# Patient Record
Sex: Male | Born: 1935 | Race: White | Hispanic: No | Marital: Married | Smoking: Former smoker
Health system: Southern US, Community
[De-identification: ages and names within clinical notes are randomized; demographics above are authoritative.]

## PROBLEM LIST (undated history)

## (undated) DIAGNOSIS — I2510 Atherosclerotic heart disease of native coronary artery without angina pectoris: Secondary | ICD-10-CM

## (undated) DIAGNOSIS — E785 Hyperlipidemia, unspecified: Secondary | ICD-10-CM

## (undated) DIAGNOSIS — I714 Abdominal aortic aneurysm, without rupture: Secondary | ICD-10-CM

## (undated) DIAGNOSIS — K219 Gastro-esophageal reflux disease without esophagitis: Secondary | ICD-10-CM

## (undated) DIAGNOSIS — R269 Unspecified abnormalities of gait and mobility: Secondary | ICD-10-CM

## (undated) DIAGNOSIS — Z87442 Personal history of urinary calculi: Secondary | ICD-10-CM

## (undated) DIAGNOSIS — K279 Peptic ulcer, site unspecified, unspecified as acute or chronic, without hemorrhage or perforation: Secondary | ICD-10-CM

## (undated) DIAGNOSIS — R42 Dizziness and giddiness: Secondary | ICD-10-CM

## (undated) DIAGNOSIS — I739 Peripheral vascular disease, unspecified: Secondary | ICD-10-CM

## (undated) DIAGNOSIS — D509 Iron deficiency anemia, unspecified: Secondary | ICD-10-CM

## (undated) DIAGNOSIS — D126 Benign neoplasm of colon, unspecified: Secondary | ICD-10-CM

## (undated) DIAGNOSIS — K3184 Gastroparesis: Secondary | ICD-10-CM

## (undated) DIAGNOSIS — I10 Essential (primary) hypertension: Secondary | ICD-10-CM

## (undated) HISTORY — DX: Abdominal aortic aneurysm, without rupture: I71.4

## (undated) HISTORY — DX: Unspecified abnormalities of gait and mobility: R26.9

## (undated) HISTORY — DX: Peptic ulcer, site unspecified, unspecified as acute or chronic, without hemorrhage or perforation: K27.9

## (undated) HISTORY — PX: HERNIA REPAIR: SHX51

## (undated) HISTORY — DX: Atherosclerotic heart disease of native coronary artery without angina pectoris: I25.10

## (undated) HISTORY — DX: Iron deficiency anemia, unspecified: D50.9

## (undated) HISTORY — DX: Peripheral vascular disease, unspecified: I73.9

## (undated) HISTORY — DX: Gastro-esophageal reflux disease without esophagitis: K21.9

## (undated) HISTORY — PX: VASECTOMY: SHX75

## (undated) HISTORY — PX: TONSILLECTOMY: SUR1361

## (undated) HISTORY — DX: Benign neoplasm of colon, unspecified: D12.6

## (undated) HISTORY — DX: Essential (primary) hypertension: I10

## (undated) HISTORY — PX: CATARACT EXTRACTION: SUR2

## (undated) HISTORY — DX: Gastroparesis: K31.84

## (undated) HISTORY — PX: COLONOSCOPY: SHX174

## (undated) HISTORY — DX: Hyperlipidemia, unspecified: E78.5

## (undated) HISTORY — DX: Dizziness and giddiness: R42

---

## 1999-04-11 ENCOUNTER — Encounter: Admission: RE | Admit: 1999-04-11 | Discharge: 1999-07-10 | Payer: Self-pay | Admitting: Endocrinology

## 2000-06-26 ENCOUNTER — Encounter: Payer: Self-pay | Admitting: Urology

## 2000-06-27 ENCOUNTER — Ambulatory Visit (HOSPITAL_COMMUNITY): Admission: RE | Admit: 2000-06-27 | Discharge: 2000-06-27 | Payer: Self-pay | Admitting: Urology

## 2002-08-04 ENCOUNTER — Encounter: Payer: Self-pay | Admitting: Gastroenterology

## 2003-04-06 ENCOUNTER — Emergency Department (HOSPITAL_COMMUNITY): Admission: EM | Admit: 2003-04-06 | Discharge: 2003-04-07 | Payer: Self-pay | Admitting: Emergency Medicine

## 2003-04-07 ENCOUNTER — Encounter: Payer: Self-pay | Admitting: Emergency Medicine

## 2003-11-29 DIAGNOSIS — D126 Benign neoplasm of colon, unspecified: Secondary | ICD-10-CM

## 2003-11-29 HISTORY — DX: Benign neoplasm of colon, unspecified: D12.6

## 2003-11-30 ENCOUNTER — Encounter: Payer: Self-pay | Admitting: Gastroenterology

## 2004-06-03 ENCOUNTER — Ambulatory Visit: Payer: Self-pay | Admitting: Pulmonary Disease

## 2004-07-13 ENCOUNTER — Ambulatory Visit: Payer: Self-pay | Admitting: Pulmonary Disease

## 2004-08-17 ENCOUNTER — Ambulatory Visit: Payer: Self-pay | Admitting: Pulmonary Disease

## 2005-04-12 ENCOUNTER — Ambulatory Visit: Payer: Self-pay | Admitting: Pulmonary Disease

## 2005-05-12 HISTORY — PX: CARDIAC CATHETERIZATION: SHX172

## 2005-05-18 ENCOUNTER — Inpatient Hospital Stay (HOSPITAL_COMMUNITY): Admission: AD | Admit: 2005-05-18 | Discharge: 2005-05-21 | Payer: Self-pay | Admitting: Cardiovascular Disease

## 2005-05-18 HISTORY — PX: CORONARY ANGIOPLASTY: SHX604

## 2005-05-19 HISTORY — PX: OTHER SURGICAL HISTORY: SHX169

## 2005-06-08 ENCOUNTER — Encounter (HOSPITAL_COMMUNITY): Admission: RE | Admit: 2005-06-08 | Discharge: 2005-09-06 | Payer: Self-pay | Admitting: Cardiovascular Disease

## 2005-09-07 ENCOUNTER — Encounter (HOSPITAL_COMMUNITY): Admission: RE | Admit: 2005-09-07 | Discharge: 2005-12-06 | Payer: Self-pay | Admitting: Cardiovascular Disease

## 2005-10-17 ENCOUNTER — Ambulatory Visit: Payer: Self-pay | Admitting: Pulmonary Disease

## 2005-10-27 ENCOUNTER — Ambulatory Visit: Payer: Self-pay | Admitting: Gastroenterology

## 2005-11-07 ENCOUNTER — Encounter (INDEPENDENT_AMBULATORY_CARE_PROVIDER_SITE_OTHER): Payer: Self-pay | Admitting: *Deleted

## 2005-11-07 ENCOUNTER — Ambulatory Visit (HOSPITAL_COMMUNITY): Admission: RE | Admit: 2005-11-07 | Discharge: 2005-11-07 | Payer: Self-pay | Admitting: Gastroenterology

## 2005-12-14 ENCOUNTER — Ambulatory Visit: Payer: Self-pay | Admitting: Gastroenterology

## 2006-01-03 ENCOUNTER — Ambulatory Visit: Payer: Self-pay | Admitting: Pulmonary Disease

## 2006-06-18 ENCOUNTER — Ambulatory Visit: Payer: Self-pay | Admitting: Gastroenterology

## 2006-08-03 ENCOUNTER — Ambulatory Visit: Payer: Self-pay | Admitting: Pulmonary Disease

## 2007-05-23 ENCOUNTER — Ambulatory Visit: Payer: Self-pay | Admitting: Gastroenterology

## 2007-09-26 DIAGNOSIS — I10 Essential (primary) hypertension: Secondary | ICD-10-CM | POA: Insufficient documentation

## 2007-09-26 DIAGNOSIS — I2510 Atherosclerotic heart disease of native coronary artery without angina pectoris: Secondary | ICD-10-CM | POA: Insufficient documentation

## 2007-09-26 DIAGNOSIS — N210 Calculus in bladder: Secondary | ICD-10-CM | POA: Insufficient documentation

## 2007-09-26 DIAGNOSIS — E7800 Pure hypercholesterolemia, unspecified: Secondary | ICD-10-CM | POA: Insufficient documentation

## 2007-09-26 DIAGNOSIS — J309 Allergic rhinitis, unspecified: Secondary | ICD-10-CM | POA: Insufficient documentation

## 2007-09-26 DIAGNOSIS — K429 Umbilical hernia without obstruction or gangrene: Secondary | ICD-10-CM | POA: Insufficient documentation

## 2007-09-26 DIAGNOSIS — K219 Gastro-esophageal reflux disease without esophagitis: Secondary | ICD-10-CM | POA: Insufficient documentation

## 2007-09-26 DIAGNOSIS — N200 Calculus of kidney: Secondary | ICD-10-CM | POA: Insufficient documentation

## 2007-09-26 DIAGNOSIS — K279 Peptic ulcer, site unspecified, unspecified as acute or chronic, without hemorrhage or perforation: Secondary | ICD-10-CM | POA: Insufficient documentation

## 2007-09-26 DIAGNOSIS — Z8679 Personal history of other diseases of the circulatory system: Secondary | ICD-10-CM | POA: Insufficient documentation

## 2008-04-22 HISTORY — PX: NM MYOVIEW LTD: HXRAD82

## 2008-07-13 ENCOUNTER — Encounter: Payer: Self-pay | Admitting: Internal Medicine

## 2008-07-31 DIAGNOSIS — I714 Abdominal aortic aneurysm, without rupture, unspecified: Secondary | ICD-10-CM

## 2008-07-31 HISTORY — DX: Abdominal aortic aneurysm, without rupture: I71.4

## 2008-07-31 HISTORY — DX: Abdominal aortic aneurysm, without rupture, unspecified: I71.40

## 2008-10-12 ENCOUNTER — Encounter: Payer: Self-pay | Admitting: Internal Medicine

## 2008-10-29 ENCOUNTER — Encounter: Admission: RE | Admit: 2008-10-29 | Discharge: 2008-10-29 | Payer: Self-pay | Admitting: Cardiovascular Disease

## 2008-11-03 ENCOUNTER — Ambulatory Visit (HOSPITAL_COMMUNITY): Admission: RE | Admit: 2008-11-03 | Discharge: 2008-11-03 | Payer: Self-pay | Admitting: Cardiovascular Disease

## 2008-11-18 ENCOUNTER — Encounter: Payer: Self-pay | Admitting: Internal Medicine

## 2008-11-26 ENCOUNTER — Encounter: Payer: Self-pay | Admitting: Internal Medicine

## 2008-12-08 ENCOUNTER — Ambulatory Visit: Payer: Self-pay | Admitting: Vascular Surgery

## 2009-01-23 ENCOUNTER — Encounter: Admission: RE | Admit: 2009-01-23 | Discharge: 2009-01-23 | Payer: Self-pay | Admitting: Orthopedic Surgery

## 2009-02-22 DIAGNOSIS — Z860100 Personal history of colon polyps, unspecified: Secondary | ICD-10-CM | POA: Insufficient documentation

## 2009-02-25 ENCOUNTER — Ambulatory Visit: Payer: Self-pay | Admitting: Gastroenterology

## 2009-02-25 DIAGNOSIS — K3184 Gastroparesis: Secondary | ICD-10-CM | POA: Insufficient documentation

## 2009-02-26 ENCOUNTER — Encounter: Payer: Self-pay | Admitting: Gastroenterology

## 2009-03-03 ENCOUNTER — Encounter: Payer: Self-pay | Admitting: Internal Medicine

## 2009-03-04 ENCOUNTER — Encounter: Payer: Self-pay | Admitting: Gastroenterology

## 2009-03-25 ENCOUNTER — Encounter: Payer: Self-pay | Admitting: Gastroenterology

## 2009-03-25 ENCOUNTER — Ambulatory Visit: Payer: Self-pay | Admitting: Gastroenterology

## 2009-03-27 ENCOUNTER — Encounter: Payer: Self-pay | Admitting: Gastroenterology

## 2009-12-20 HISTORY — PX: NM MYOVIEW LTD: HXRAD82

## 2010-10-26 ENCOUNTER — Ambulatory Visit (INDEPENDENT_AMBULATORY_CARE_PROVIDER_SITE_OTHER): Payer: Medicare Other | Admitting: Vascular Surgery

## 2010-10-26 DIAGNOSIS — I6529 Occlusion and stenosis of unspecified carotid artery: Secondary | ICD-10-CM

## 2010-10-28 ENCOUNTER — Ambulatory Visit (HOSPITAL_COMMUNITY)
Admission: RE | Admit: 2010-10-28 | Discharge: 2010-10-28 | Disposition: A | Discharge status: Home / Self Care | Payer: Medicare Other | Source: Ambulatory Visit | Attending: Vascular Surgery | Admitting: Vascular Surgery

## 2010-10-28 ENCOUNTER — Other Ambulatory Visit: Payer: Self-pay | Admitting: Vascular Surgery

## 2010-10-28 ENCOUNTER — Encounter (HOSPITAL_COMMUNITY)
Admission: RE | Admit: 2010-10-28 | Discharge: 2010-10-28 | Disposition: A | Discharge status: Home / Self Care | Payer: Medicare Other | Source: Ambulatory Visit | Attending: Vascular Surgery | Admitting: Vascular Surgery

## 2010-10-28 DIAGNOSIS — I6529 Occlusion and stenosis of unspecified carotid artery: Secondary | ICD-10-CM

## 2010-10-28 DIAGNOSIS — Z01818 Encounter for other preprocedural examination: Secondary | ICD-10-CM | POA: Insufficient documentation

## 2010-10-28 DIAGNOSIS — Z01812 Encounter for preprocedural laboratory examination: Secondary | ICD-10-CM | POA: Insufficient documentation

## 2010-10-28 DIAGNOSIS — Z87891 Personal history of nicotine dependence: Secondary | ICD-10-CM | POA: Insufficient documentation

## 2010-10-28 DIAGNOSIS — Z01810 Encounter for preprocedural cardiovascular examination: Secondary | ICD-10-CM | POA: Insufficient documentation

## 2010-10-28 DIAGNOSIS — I10 Essential (primary) hypertension: Secondary | ICD-10-CM | POA: Insufficient documentation

## 2010-10-28 LAB — CBC
Hemoglobin: 14.4 g/dL (ref 13.0–17.0)
MCH: 30.1 pg (ref 26.0–34.0)
RBC: 4.78 MIL/uL (ref 4.22–5.81)
WBC: 7 10*3/uL (ref 4.0–10.5)

## 2010-10-28 LAB — URINALYSIS, ROUTINE W REFLEX MICROSCOPIC
Glucose, UA: NEGATIVE mg/dL
Nitrite: NEGATIVE
Protein, ur: NEGATIVE mg/dL
Urobilinogen, UA: 0.2 mg/dL (ref 0.0–1.0)

## 2010-10-28 LAB — COMPREHENSIVE METABOLIC PANEL
Alkaline Phosphatase: 63 U/L (ref 39–117)
BUN: 16 mg/dL (ref 6–23)
CO2: 28 mEq/L (ref 19–32)
Chloride: 106 mEq/L (ref 96–112)
Creatinine, Ser: 0.74 mg/dL (ref 0.4–1.5)
GFR calc non Af Amer: >60 mL/min (ref >60)
Glucose, Bld: 110 mg/dL — ABNORMAL HIGH (ref 70–99)
Potassium: 3.7 mEq/L (ref 3.5–5.1)
Total Bilirubin: 0.6 mg/dL (ref 0.3–1.2)

## 2010-10-28 LAB — APTT: aPTT: 25 seconds (ref 24–37)

## 2010-10-28 LAB — URINE MICROSCOPIC-ADD ON

## 2010-10-28 LAB — PROTIME-INR
INR: 0.93 (ref 0.00–1.49)
Prothrombin Time: 12.7 seconds (ref 11.6–15.2)

## 2010-10-28 LAB — SURGICAL PCR SCREEN: MRSA, PCR: NEGATIVE

## 2010-10-31 NOTE — H&P (Signed)
HISTORY AND PHYSICAL EXAMINATION  October 26, 2010  Re:  Nathan Santiago, Nathan Santiago             DOB:  [birth year]  REASON FOR ADMISSION:  Greater than 80% right carotid stenosis.  HISTORY:  This is a pleasant 77 year old gentleman who was referred by Dr. Alanda Amass with a greater than 80% right carotid stenosis.  I had last seen him in May 2011 when he had a 60% to 79% right carotid stenosis with 40% to 59% left carotid stenosis.  He was asymptomatic. On a recent follow-up duplex scan at [facility] on October 14, 2010, the stenosis on the right had progressed to greater than 80%.  Of note, he is asymptomatic.  He denies any history of stroke, TIAs, expressive or receptive aphasia, or amaurosis fugax.  He does note that he has been having some trembling in his left hand which has been constant for the last several months.  His past medical history significant for hypertension, hypercholesterolemia, and coronary artery disease.  He has had previous coronary stents in the past by Dr. Alanda Amass.  He has had no recent cardiac issues.  He denies any history of previous myocardial infarction, history congestive heart failure, or history of COPD.  SOCIAL HISTORY:  He is married.  He is 2 grown children.  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  He does not smoke cigarettes.  FAMILY HISTORY:  He is unaware of any history of premature cardiovascular disease.  ALLERGIES:  LISINOPRIL and NIASPAN.  MEDICATIONS: 1. Amlodipine 5 mg p.o. q.a.m. 2. Hydrochlorothiazide 25 mg p.o. q.a.m. 3. Plavix 75 mg q.h.s.  This was held on October 26, 2010. 4. Potassium chloride 20 mEq p.o. q.h.s. 5. Atenolol 50 mg p.o. q.a.m. 6. Metoclopramide 5 mg p.o. q.p.m. 7. Omeprazole 40 mg p.o. b.i.d. 8. Pletal 100 mg p.o. b.i.d. 9. Terazosin 2 mg 1.p.o. q.h.s. 10.Aspirin 81 mg p.o. daily. 11.Vytorin 10/80 one p.o. daily. 12.Multivitamin 1 p.o. daily. 13.Vitamin D3 p.o.  daily. 14.Primrose Oil. 15.Vitamin C. 16.Biotin. 17.Omega-3 Fish Oil. 18.Calcium citrate. 19.Glucosamine.  REVIEW OF SYSTEMS:  GENERAL:  He had no recent weight loss, weight gain, or problems with his appetite.  CARDIOVASCULAR:  He had no chest pain, chest pressure, palpitations, or arrhythmias.  He had no history of DVT or phlebitis.  He denies claudication.  GI:  He does have a history of reflux.  GU:  He does have some urinary frequency.  NEUROLOGIC, PULMONARY, HEMATOLOGIC, ENT, MUSCULOSKELETAL, PSYCHIATRIC, INTEGUMENTARY review of systems are unremarkable.  PHYSICAL EXAMINATION:  This is a pleasant 77 year old gentleman who appears his stated age. Blood pressure is 125/63, heart rate is 77, respiratory rate is 12. HEENT:  Unremarkable. LUNGS:  Clear bilaterally to auscultation without rales, rhonchi, or wheezing. CARDIOVASCULAR:  He has a right carotid bruit.  He has a regular rate and rhythm.  He has palpable radial and femoral pulse.  Both feet are warm and well-perfused without ischemic ulcers.  He has no significant lower extremity swelling. ABDOMEN:  Soft and nontender with normal pitched bowel sounds. MUSCULOSKELETAL:  He has no major deformities or cyanosis. NEUROLOGIC:  He has no focal weakness or paresthesias. SKIN:  There are no ulcers or rashes.  I have reviewed his notes from Dr. Kandis Cocking office.  He did have a Cardiolite in May 2011 which showed no ischemia.  I have also reviewed his carotid duplex scan which shows a greater than 80% right carotid stenosis with a 50%  to 69% left carotid stenosis.  Both vertebral arteries are patent with normally directed flow.  Given that the right carotid stenosis has progressed to greater than 80%, I have recommended right carotid endarterectomy in order to lower his risk of future stroke.  We have discussed the indications for the procedure and the potential complications including but not limited to bleeding, stroke  (periprocedural risk 1% to 2%), nerve injury, MI, or other unpredictable medical problems.  All of his questions were answered and he is agreeable to proceed.  His surgery has been scheduled for April 3rd.  We will hold his Plavix 1 week preoperatively and then resume this after surgery.    Nathan Santiago. Edilia Bo, M.D. Electronically Signed  CSD/MEDQ  D:  10/26/2010  T:  10/27/2010  Job:  [id number]  cc:   Gerlene Burdock A. Alanda Amass, M.D.

## 2010-11-01 ENCOUNTER — Inpatient Hospital Stay (HOSPITAL_COMMUNITY)
Admission: RE | Admit: 2010-11-01 | Discharge: 2010-11-02 | DRG: 039 | Disposition: A | Discharge status: Home / Self Care | Payer: Medicare Other | Source: Ambulatory Visit | Attending: Vascular Surgery | Admitting: Vascular Surgery

## 2010-11-01 ENCOUNTER — Other Ambulatory Visit: Payer: Self-pay | Admitting: Vascular Surgery

## 2010-11-01 DIAGNOSIS — I6529 Occlusion and stenosis of unspecified carotid artery: Secondary | ICD-10-CM

## 2010-11-01 HISTORY — PX: CAROTID ENDARTERECTOMY: SUR193

## 2010-11-02 LAB — BASIC METABOLIC PANEL
BUN: 11 mg/dL (ref 6–23)
Calcium: 8.3 mg/dL — ABNORMAL LOW (ref 8.4–10.5)
GFR calc non Af Amer: >60 mL/min (ref >60)
Glucose, Bld: 129 mg/dL — ABNORMAL HIGH (ref 70–99)
Potassium: 3.8 mEq/L (ref 3.5–5.1)
Sodium: 140 mEq/L (ref 135–145)

## 2010-11-02 LAB — CBC
MCHC: 34.6 g/dL (ref 30.0–36.0)
Platelets: 135 10*3/uL — ABNORMAL LOW (ref 150–400)
RDW: 12.3 % (ref 11.5–15.5)
WBC: 8.6 10*3/uL (ref 4.0–10.5)

## 2010-11-02 NOTE — Op Note (Signed)
NAMEDAMARE, SERANO NO.:  [id number]  MEDICAL RECORD NO.:  [id number]           PATIENT TYPE:  I  LOCATION:  2316                         FACILITY:  [facility]  PHYSICIAN:  Di Kindle. Edilia Bo, M.D.DATE OF BIRTH:  01/03/1931  DATE OF PROCEDURE:  11/01/2010 DATE OF DISCHARGE:                              OPERATIVE REPORT   PREOPERATIVE DIAGNOSIS:  Greater than 80% right carotid stenosis.  POSTOPERATIVE DIAGNOSIS:  Greater than 80% right carotid stenosis.  PROCEDURE:  Right carotid endarterectomy with Dacron patch angioplasty.  SURGEON:  Di Kindle. Edilia Bo, MD  ASSISTANT:  Della Goo, PA-C  ANESTHESIA:  General.  INDICATIONS:  This is a 77 year old gentleman who had a known moderate right carotid stenosis.  This progressed to greater than 80% and right carotid endarterectomy was recommended in order to lower his risk of future stroke.  TECHNIQUE:  The patient was taken to the operating room and received a general anesthetic.  After careful positioning, the right neck was prepped and draped in the usual sterile fashion.  An incision was made along the anterior border of the sternocleidomastoid and dissection was carried down to the common carotid artery which was dissected free and controlled with a Rumel tourniquet.  The facial vein was divided between 2-0 silk ties.  The common carotid artery plaque extended quite well and I had to extend the incision distally to allow adequate exposure of the common carotid artery below the plaque.  Bifurcation was fairly high and I had to fully mobilize the hypoglossal nerve.  The ansa cervicalis was mobilized laterally.  The common carotid artery was controlled with an umbilical tape.  The superior thyroid artery was controlled with a 2-0 silk tie.  The external carotid artery was controlled with a blue loop and the internal carotid artery was controlled above the plaque.  The patient was then heparinized.   After the heparin had circulated, clamps were placed on the internal and the external and common carotid artery. Longitudinal arteriotomy was made in the common carotid artery and this was extended to the plaque into the internal carotid artery above the plaque.  A 12 shunt would not pass easily, therefore I elected to use a #10 shunt below.  This was slightly small for the size of the artery. Shunt was placed into the internal carotid artery, backbled, and placed in the common carotid artery and flow established to the shunt.  This shunt was secured with a Rumel tourniquet.  Endarterectomy plane was established proximally and the plaque was sharply divided.  Eversion endarterectomy was performed of the external carotid artery.  Distally, there was a nice taper in the plaque.  One posterior tacking suture was placed.  The artery was irrigated with copious amounts of heparin and dextran and all loose debris removed.  Dacron patch was then sewn using continuous 6-0 Prolene suture.  Prior to completing the patch closure, the shunt was removed.  The arteries were backbled and flushed appropriately and anastomosis was completed.  Flow was reestablished first to the external carotid artery and then to the internal carotid artery.  I think the patient had a good  pulse distal to the patch and a Doppler signal.  Hemostasis was obtained in the wound.  The wound was closed with a deep layer of 3-0 Vicryl.  The platysma was closed with running 3-0 Vicryl and the skin was closed with 4-0 subcuticular stitch.  Sterile dressing was applied.  The patient tolerated the procedure well and was transferred to the recovery room in stable condition.  All needle and sponge counts were correct.     Di Kindle. Edilia Bo, M.D.     CSD/MEDQ  D:  11/01/2010  T:  11/02/2010  Job:  [id number]  cc:   Gerlene Burdock A. Alanda Amass, M.D.  Electronically Signed by Waverly Ferrari M.D. on 11/02/2010 12:30:13 PM

## 2010-11-04 LAB — TYPE AND SCREEN: ABO/RH(D): O POS

## 2010-11-04 LAB — ABO/RH: ABO/RH(D): O POS

## 2010-11-15 NOTE — Discharge Summary (Addendum)
  Nathan Santiago, Nathan Santiago NO.:  [id number]  MEDICAL RECORD NO.:  [id number]           PATIENT TYPE:  LOCATION:                                 FACILITY:  PHYSICIAN:  Di Kindle. Edilia Bo, M.D.DATE OF BIRTH:  12/01/1930  DATE OF ADMISSION: DATE OF DISCHARGE:                              DISCHARGE SUMMARY   ADDENDUM  DISCHARGE DIAGNOSES:  Right carotid stenosis which is asymptomatic.  His other discharge diagnoses are hypertension, cardiac arrhythmia, and peripheral vascular disease.  DISCHARGE MEDICATIONS: 1. Calcium citrate. 2. Magnesium 1 daily. 3. Biotin 5 mg daily. 4. Primrose 1300 mg daily. 5. Vitamin D daily. 6. Multivitamins daily. 7. Vytorin 10/80 mg daily. 8. Terazosin 5 mg daily at bedtime. 9. Pletal 100 mg twice daily. 10.Omeprazole 40 mg twice daily. 11.Atenolol 50 mg daily. 12.Potassium chloride 20 mEq daily. 13.Plavix 75 mg daily. 14.Hydrochlorothiazide 25 mg daily. 15.Amlodipine 5 mg daily.  DISPOSITION:  The patient was discharged to home and he will follow up with Dr. Edilia Bo in 2 weeks.     Della Goo, PA-C   ______________________________ Di Kindle. Edilia Bo, M.D.    RR/MEDQ  D:  11/11/2010  T:  11/12/2010  Job:  [id number]  Electronically Signed by Waverly Ferrari M.D. on 11/15/2010 08:36:46 AM Electronically Signed by Della Goo PA on 11/21/2010 02:10:37 PM

## 2010-11-16 ENCOUNTER — Ambulatory Visit (INDEPENDENT_AMBULATORY_CARE_PROVIDER_SITE_OTHER): Payer: Medicare Other | Admitting: Vascular Surgery

## 2010-11-16 DIAGNOSIS — I6529 Occlusion and stenosis of unspecified carotid artery: Secondary | ICD-10-CM

## 2010-11-17 NOTE — Assessment & Plan Note (Signed)
OFFICE VISIT  Nathan Santiago, Nathan Santiago DOB:  Aug 25, 1930                                       11/16/2010 UJWJX#:[id number]  I saw this patient in the office today for follow-up after his recent right carotid endarterectomy.  This is a pleasant 77 year old gentleman who had a known moderate right carotid stenosis.  This progressed to greater than 80% and right carotid endarterectomy was recommended in order to lower his risk of future stroke.  He underwent right carotid endarterectomy with Dacron patch angioplasty in 11/01/2010.  He did well postoperatively and returns for his first postoperative visit.  He states that he had had some occasional dizziness prior to his surgery but this apparently has resolved.  He was asymptomatic other than this prior to surgery and has had no focal neurologic symptoms since his surgery.  He remains on aspirin and Plavix.  PHYSICAL EXAMINATION:  This is a pleasant 77 year old gentleman who appears his stated age.  Blood pressure is 149/75, heart rate is 89, respiratory rate is 12. His right neck incision is healing nicely. Lungs:  Clear bilaterally to auscultation without rales, rhonchi or wheezing.  Cardiovascular exam:  He has a regular rate and rhythm.  He has palpable femoral pulses.  Neurologic:  He has no focal weakness or paresthesias.  Overall I am pleased with his progress.  I will see him back in 6 months with follow-up carotid duplex scan.  He knows to call sooner if  he has problems.  Has a known 50%-60% left carotid stenosis which we will continue to follow.    Di Kindle. Edilia Bo, M.D. Electronically Signed  CSD/MEDQ  D:  11/16/2010  T:  11/17/2010  Job:  [id number]  cc:   Gerlene Burdock A. Alanda Amass, M.D.

## 2010-12-13 NOTE — Cardiovascular Report (Signed)
Nathan Santiago, Nathan Santiago NO.:  [id number]   MEDICAL RECORD NO.:  [id number]          PATIENT TYPE:  AMB   LOCATION:  SDS                          FACILITY:  [facility]   PHYSICIAN:  Richard A. Alanda Amass, M.D.DATE OF BIRTH:  12/08/1930   DATE OF PROCEDURE:  11/03/2008  DATE OF DISCHARGE:  11/03/2008                            CARDIAC CATHETERIZATION   PROCEDURE:  Retrograde central aortic catheterization; aortic arch  angiogram, LAO projection; abdominal aortic angiogram, midstream PA  projection; bilateral iliac angiogram, PA projection; bilateral lower  extremity runoff; four-vessel cerebral angiography.   The patient was brought to the Second Floor PV lab in a postabsorptive  after 5 mg Valium p.o. premedication.  The right groin was prepped and  draped in usual manner.  Xylocaine 1% was used for local anesthesia and  RCFA was entered with a single anterior puncture using an 18 thin-wall  needle.  A 5-French short arterial side-arm sheath was inserted without  difficulty.  A wholly wire was used for catheter exchange and to  negotiate the iliacs and abdominal aorta.  A 5-French pigtail catheter  was positioned in the aortic root and aortic root angiogram was done in  the LAO projection with DSA at 25 mL, 20 mL per second.  Catheter was  brought down above the level of the renal arteries and abdominal  angiogram was done at 20 mL, 20 mL per second.  Catheter was then  brought above the iliac bifurcation and the suprailiac bifurcation,  atherosclerotic stenotic area, and bilateral iliac angiography was done  in the PA projection at 20 mL, 20 mL per second with DSA.  Bilateral  lower extremity runoff was done at 77 mL, 7 mL per second.  Catheter was  then exchanged for a JV-1 5-French catheter.  Four-vessel cerebral  angiography was then performed using the JV-1 catheter with selective  and subselective right vertebral injections and selective right and left  common  carotid injections and subselective left vertebral and selective  left subclavian injections.  DSA imaging was used.  Guidewire vein was  used for catheter manipulation and exchanges.  Cervical carotid  angiography was done in PA, lateral, and oblique projections, right and  left and intracranial injections were performed in PA and lateral for  bilateral carotids and vertebrals.  Catheters were removed.  Transstenotic gradient initially with a pigtail catheter and then an end-  hole catheter across the distal abdominal aorta was done.  There was a  gradient of approximately 15 mmHg across the distal abdominal aorta into  the right iliac.   The patient had intermittent mild exacerbations of pressure having him  void to lower his blood pressure down, 230 systolic to 180.  At the end  of the procedure with systolics of about 200 in sinus rhythm.  He was  given 10 mg of labetalol for pressure reduction, which was successful.  Side-arm sheath was flushed.  The patient was transferred to the holding  area for sheath removal and pressure hemostasis.  He tolerated the  procedure well and was intact neurologically.   COMMENT:  The right vertebral artery  in the extraosseous, foraminal,  extraspinal, and intradural area show tortuosity, but was widely patent.  There were few areas of 30-40% narrowing in the midportion of the right  vertebra with good lumen and no flow restriction.  The left vertebral  artery in the similar areas showed a 40% eccentric narrowing in the  proximal third, 40-50% in midportion, 40% in the distal cervical third,  and a tortuous vessel of good size with good antegrade flow.  Intracranial vertebrobasilar system bilaterally showed intact basilar  artery and bilateral intact posterior cerebral arteries and normal  origin of the ICA and cerebral branches bilaterally.   The right common carotid artery had no significant stenosis.   The right internal carotid had 80-85%  eccentric highly calcific stenosis  in its proximal portion.  There was no thrombus visible.  The external  carotid had no significant stenosis.  The cervical, petrous, cavernous,  and communicating intracerebral carotids showed no significant stenosis.  Right anterior cerebral was intact.  Anterior communicating was faintly  visualized and middle cerebral branches appeared normal with normal  cannulation of vessels to the outer cortex and normal venous return.   Left common carotid showed no significant stenosis.  Left internal  carotid had 50-60% calcific narrowing.  The left intracranial carotid  and its branches likewise appeared with no significant stenosis.  Normal  left anterior cerebral and left MCA and branches continuing to the outer  cortex.  Normal left-sided intracranial venous return.   The aortic arch showed normal origin of the great vessels and was a type  1 to 1-1/2 arch with no significant calcification.   Abdominal aortic angiogram demonstrated normal SMA and celiac axis.  Both renal arteries were single and normal.   The infrarenal abdominal aorta just above the iliac bifurcation, but  separate from the bifurcation showed approximately 70% calcific complex  atheromatous narrowing.  Lateral angiogram was not helpful in this  regard.  There was approximately 15-mm pullback gradient across this  area into the iliacs.   There was 30% right common iliac narrowing.  No significant left common  iliac narrowing.   Hypogastrics were intact bilaterally.   The right external iliac had 40% proximal narrowing.  The LEIA had no  significant narrowing.  The profunda SFA junctions were intact  bilaterally with no significant stenosis and no CFA stenosis  bilaterally.   The right SFA at 40% proximal, 60% eccentric mid, and 50% of [place]  canal narrowing with good flow.  The popliteal had no significant  stenosis and there appeared to be three-vessel runoff with mild  diffuse  disease of the posterior tibial, but no high-grade stenosis.   The left SFA had 50% mid, 70% at [facility] canal, and 60% near Wallace canal  eccentric narrowing with good flow.  The left popliteal had no  significant stenosis and there was three-vessel runoff of the left lower  extremity.   DISCUSSION:  Mr. Chalfin is a 77 year old white, married, father of  2, with 4 GC, who stopped smoking at 10.  He has systemic hypertension,  hyperlipidemia, asymptomatic carotid disease, coronary disease, and  bilateral claudication.  He has had progressive claudication for  approximately 6 months and he is unable to walk on the golf course.  He  has bilateral lower extremity calf claudication, bilateral lower  extremities generalized fatigue on walking approximately 50-75 yards.   ABIs of October 12, 2008, showed elevated velocities in the common iliacs  bilaterally.  RABI 0.81, LABI 0.81  with three-vessel runoff and no high-  grade SFA velocities bilaterally.   Followup carotid Dopplers of October 12, 2008, showed elevated right  internal carotid velocities of 315/90.  Left carotid velocities of  184/43.  There was calcific shattering bilaterally, right greater than  left.   The patient has had prior coronary complex intervention in a staged  fashion on May 18, 2005.  He had triple tandem DES stenting to the  proximal - mid RCA and double tandem DES stenting of the distal RCA  (five stents in the RCA).   He then had a staged circumflex DES stent on May 19, 2005.  He has  not required coronary recatheterization since that time.  He has not had  angina and he has had negative Cardiolite for ischemia on April 22, 2008.  His lipids today is under good control with cholesterol 139, LDL  of 87, HDL 41 on high-dose Vytorin 10/80.  He has not had any symptoms  of heart failure.   Angiography shows asymptomatic high-grade right internal carotid  stenosis with elevated velocities.   I believe he is a potential  candidate for carotid endarterectomy.  He does have known coronary  disease, although asymptomatic, and he had a Cardiolite last year, and  will probably want to follow up another Cardiolite because of his  complex interventions in the past.   He has stenosis of his distal abdominal aorta without significant iliac  or high-grade SFA disease and may have because of bilateral symptoms,  inflow problems.  He is a possible candidate for distal abdominal aortic  stenting for his symptomatic bilateral claudication.  We will continue  him on aggressive medical therapy of his hypertension, and we will go  ahead and get Vascular surgical consultation for his carotid stenosis  and review the cine angiograms and get further opinions about possible  abdominal aortic intervention.   CATHETERIZATION DIAGNOSES:  1. Asymptomatic high-grade right internal carotid stenosis.  2. Mild to moderate left internal carotid stenosis, 50-60%.  3. Normal intracranial circulation and minor vertebral disease      bilaterally, antegrade.  4. Infrarenal abdominal aortic atherosclerotic stenosis probably      counting for bilateral lower extremity claudication with three-      vessel runoff bilaterally.  5. Coronary artery disease status post remote complex interventions as      outlined above, staged, right coronary artery 5 drug-eluting stents      in May 18, 2005, and proximal circumflex, one drug-eluting      stent May 19, 2005.  Negative Cardiolite for ischemia,      September 2009.  6. Hyperlipidemia on therapy.  7. Systemic hypertension, normal renal arteries.  8. Abnormal carotid duplex and lower extremity Dopplers as outlined      above, October 07, 2008.      Richard A. Alanda Amass, M.D.  Electronically Signed     RAW/MEDQ  D:  11/03/2008  T:  11/04/2008  Job:  [id number]   cc:   [facility] Lab  Jeannett Senior A. Evlyn Kanner, M.D.  _______[facility]

## 2010-12-13 NOTE — Consult Note (Signed)
NEW PATIENT CONSULTATION   Nathan Santiago, Nathan Santiago  DOB:  07/09/1931                                       12/08/2008  ZOXWR#:[id number]   I saw the patient in the office today in consultation concerning his  peripheral vascular disease and also a right carotid stenosis.  This is  a pleasant 77 year old left-handed gentleman who had a carotid duplex  scan which by velocity criteria suggested a 60-79% right carotid  stenosis with a 40-59% left carotid stenosis.  He subsequently underwent  an arteriogram to further evaluate his carotid stenosis which shows by  my interpretation approximately 75% right carotid stenosis with diffuse  irregularity in the internal carotid artery extending over 2-3 cm.  He  also had an aortogram which demonstrates a moderate aortic stenosis.  He  has had previous ankle brachial indices in Dr. Kandis Cocking office which  showed ABI of 81% bilaterally.  He also has had previous coronary  stents.   The patient denies any previous history of stroke, TIAs, expressive or  receptive aphasia or amaurosis fugax.  He does have stable bilateral  calf claudication which is equal on both sides.  He has had no history  of rest pain and no history of nonhealing wounds.  He states that he can  walk about 25 units on the treadmill before his legs get tired.   PAST MEDICAL HISTORY:  Significant for coronary artery disease and he  has had previous coronary stents by Dr. Alanda Amass.  He has hypertension  and hypercholesterolemia.  He denies any history of myocardial  infarction, history of congestive heart failure or history of COPD.   FAMILY HISTORY:  He denies any history of premature cardiovascular  disease.   SOCIAL HISTORY:  He is married.  He has four grown children.  He is an  Technical sales engineer.  He quit tobacco in 1970.   REVIEW OF SYSTEMS AND MEDICATIONS:  Are documented on the medical  history form in his chart.   PHYSICAL EXAMINATION:  This is a pleasant  77 year old gentleman who  appears his stated age.  His blood pressure is 157/79, heart rate is 71.  He has a right carotid bruit.  Neck is supple.  HEENT is unremarkable.  Lungs are clear bilaterally to auscultation.  On cardiac exam he has a  regular rate and rhythm.  Abdomen is soft and nontender.  He is obese  and somewhat difficult to assess.  He does have palpable but diminished  femoral pulses and diminished popliteal and dorsalis pedis pulses  bilaterally.  I cannot palpate posterior tibial pulses.  He had no  ischemic ulcers on his feet.  He has no significant lower extremity  swelling.  Neurologic exam is nonfocal.   I did review his cerebral arteriogram and his aortogram.  He has a  moderate stenosis in the infrarenal aorta.  I interpreted his right  carotid stenosis is about 75% and again extends over 2-3 cm and is  irregular.  There is no significant stenosis on the left side.   I explained we generally would not consider right carotid endarterectomy  unless the stenosis progressed to greater than 80% and I would recommend  simply followup duplex scan in 6 months.  If the stenosis does progress  to greater than 80% with an end diastolic velocity of greater than 115  cm/sec  then I would recommend right carotid endarterectomy in order to  lower his risk of future stroke.  In addition, if he develops any new  right hemispheric symptoms then certainly we would consider right  carotid endarterectomy and we have reviewed these potential symptoms in  the office today.   With respect to his peripheral vascular disease his symptoms appear to  be quite tolerable.  I explained there really was not a simple fix to  address the aortic stenosis and that generally we would not recommend a  very aggressive approach unless his symptoms became more disabling or he  developed rest pain or nonhealing ulcer.  I have encouraged him to stay  as active as possible.  Fortunately he is not a  smoker.  I will be happy  to see him back at any time if his symptoms progress or his carotid  stenosis progresses to greater than 80%.   Di Kindle. Edilia Bo, M.D.  Electronically Signed   CSD/MEDQ  D:  12/08/2008  T:  12/09/2008  Job:  [id number]   cc:   Gerlene Burdock A. Alanda Amass, M.D.  Tera Mater. Evlyn Kanner, M.D.

## 2010-12-16 NOTE — Assessment & Plan Note (Signed)
 HEALTHCARE                         GASTROENTEROLOGY OFFICE NOTE   DIXON, LUCZAK                    MRN:          [id number]  DATE:05/23/2007                            DOB:          [birth year]    This is a return office visit for GERD and gastroparesis.  He states his  symptoms are all under very good control on his current regimen of  Omeprazole 40 mg p.o. b.i.d. and metoclopramide 5 mg q.p.m.  He has no  dysphagia, odynophagia, weight loss, nausea, vomiting or abdominal pain.   CURRENT MEDICATIONS:  Updated and reviewed.   MEDICATION ALLERGIES:  LISINOPRIL.   PHYSICAL EXAMINATION:  GENERAL:  In no acute distress.  VITAL SIGNS:  Weight 174.6 pounds.  Blood pressure is 108/68, pulse 56  and regular.  CHEST:  Clear to auscultation bilaterally.  CARDIAC:  Regular rate and rhythm without murmurs appreciated.  ABDOMEN:  Soft and nontender with normoactive bowel sounds.   ASSESSMENT AND PLAN:  1. Gastroesophageal reflux disease and gastroparesis.  Continue      Omeprazole 40 mg p.o. b.i.d. and continue metoclopramide 5 mg p.o.      q.p.m.  Return office visit in 1 year.  2. Personal history of adenomatous colon polyps.  Recall colonoscopy      recommended for May of 2010.     Venita Lick. Russella Dar, MD, [facility]  Electronically Signed    MTS/MedQ  DD: 06/03/2007  DT: 06/03/2007  Job #: [id number]

## 2010-12-16 NOTE — Op Note (Signed)
[facility]  Patient:    Nathan Santiago, Nathan Santiago                    MRN: [id number] Proc. Date: 06/27/00 Adm. Date:  [id number] Attending:  Lauree Chandler                           Operative Report  PREOPERATIVE DIAGNOSES: 1. Bladder stones. 2. Benign prostatic hypertrophy.  POSTOPERATIVE DIAGNOSES: 1. Bladder stones. 2. Benign prostatic hypertrophy.  OPERATION:  Cystoscopy and electrohydraulic lithotripsy of bladder calculi.  SURGEON:  Maretta Bees. Vonita Moss, M.D.  ANESTHESIA:  General.  INDICATIONS: This 77 year old white male has had significant voiding symptoms and has been put on Flomax with improvement of his symptoms, but a bladder ultrasound showed evidence of bladder stones.  He is brought to the OR today for removal of these.  DESCRIPTION OF PROCEDURE:  The patient was brought to the operating room and placed in lithotomy position.  The external genitalia was prepped and draped in the usual fashion.  He underwent cystoscopy, and two bladder calculi were noted with spicules on them.  The [facility] EHL unit was going to be utilized to break up the stone, but despite changing probes, foot peddles, and getting the biomedical engineering people in, the machine did not function. Therefore, we had to send for a replacement machine at day surgery center which did arrive.  In the meantime, ultrasonic probes did not fracture the stone nor did a mechanical stone breaker because of the extreme hardness of these stones.  Plus, the holmium laser was not available in the room we were utilizing for surgery.  The replacement EHL unit from day surgery center functioned perfectly well, and the stones were fragmented into multiple small pieces and removed completely.  The stone fragments will be given to the patient.  There was no significant bleeding.  The bladder was emptied except for a couple of ounces of irrigation fluid.  The cystoscope was removed,  and the patient was sent to recovery room in good condition. DD:  06/27/00 TD:  06/27/00 Job: [id number]

## 2010-12-16 NOTE — H&P (Signed)
NAMECHISUM, HABENICHT NO.:  [id number]   MEDICAL RECORD NO.:  [id number]          PATIENT TYPE:  INP   LOCATION:  3711                         FACILITY:  [facility]   PHYSICIAN:  Richard A. Alanda Amass, M.D.DATE OF BIRTH:  19-Dec-1930   DATE OF ADMISSION:  05/18/2005  DATE OF DISCHARGE:  05/21/2005                                HISTORY & PHYSICAL   CHIEF COMPLAINT:  The patient complained of fatigue and lethargy.   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 Evlyn Kanner. During that visit he complained of being  tired and lethargic for 6 months. In addition to this, he plays golf every  week for one afternoon. For the last 6-12 months he has found he could not  carry his bag and would have to ride from hole to hole, which is completely  new for him. Because of these symptoms it was Dr. Rinaldo Cloud opinion he should  undergo a Cardiolite study. He was sent to our office and it was positive,  both on images and EKG, for ischemia in his inferior leads. His blood  pressure was elevated and then he was referred to Dr. Alanda Amass for cardiac  catheterization.   PAST MEDICAL HISTORY:  1.  Hypertension.  2.  Hypercholesterolemia.  3.  Tobacco use.  4.  Gastroesophageal reflux disease.  5.  BPH.  6.  Umbilical repair.  7.  Positive family history for coronary disease. Brother had a CABG.   SOCIAL HISTORY:  He is married, two kids, four grandchildren. Drinks wine  occasionally. History of tobacco use but he quit in 1970. Exercises by  walking.   OUTPATIENT MEDICATIONS:  1.  Hydrochlorothiazide 25 one-half tablet daily.  2.  Atenolol 25 daily.  3.  Vytorin 10/40 once daily.  4.  Prevacid 30 daily.   ALLERGIES:  LISINOPRIL causes cough.   FAMILY HISTORY:  See office note.   PHYSICAL EXAMINATION:  GENERAL:  Well-nourished, well-developed, white male  in no acute distress.  VITAL SIGNS:  Stable. Blood pressure 158/65,  pulse 70, respirations 15,  temperature 98.1, weight 162. O2 saturation on room air 93%.  GENERAL:  Alert and oriented male.  SKIN:  Warm and dry.  HEENT:  Within normal limits.  NECK:  Right carotid bruit, no JVD.  LUNGS:  Clear to auscultation and percussion bilaterally.  HEART:  Regular rate and rhythm without murmur, gallop, rub, or click.  ABDOMEN:  Soft, nontender, positive bowel sounds. Cannot palpate liver,  spleen, or masses.  LOWER EXTREMITIES:  Show 2+ pedals, no edema.  NEUROLOGIC:  Alert and oriented x3, follows all commands.   IMPRESSION:  1.  Extreme fatigue and lethargy.  2.  Positive Cardiolite study revealing ischemia.  3.  Abnormal cardiac catheterization at [facility] of coronary      stenosis.  4.  Hypertension.  5.  Hyperlipidemia.  6.  Tobacco use.  7.  Gastroesophageal reflux disease.  8.  Benign prostatic hypertrophy status post microwave reduction of his      benign prostatic hypertrophy.  9.  Carotid  bruit.   PLAN:  Intervention by Dr. Alanda Amass at [facility]      Darcella Gasman. [place], N.P.      Richard A. Alanda Amass, M.D.  Electronically Signed    LRI/MEDQ  D:  08/17/2005  T:  08/17/2005  Job:  [id number]

## 2010-12-16 NOTE — Assessment & Plan Note (Signed)
 HEALTHCARE                           GASTROENTEROLOGY OFFICE NOTE   AYDIAN, DIMMICK                    MRN:          [id number]  DATE:06/18/2006                            DOB:          February 03, 1931    RETURN OFFICE VISIT:  GERD and gastroparesis.  He has done very well on  Zegerid 40 mg b.i.d. with metoclopramide at 5 mg q.p.m.  His symptoms are  under excellent control.  He would like to change to generic omeprazole if  possible for cost reasons.   Current medications listed on the chart, updated and reviewed.   MEDICATION ALLERGIES:  LISINOPRIL.   EXAM:  In no acute distress.  Weight 174 pounds.  Blood pressure 126/70,  pulse 72 and regular.  Chest clear to auscultation bilaterally.  Cardiac  regular rate and rhythm without murmurs.  Abdomen is soft and nontender with  normal active bowel sounds.   ASSESSMENT/PLAN:  1. Gastroesophageal reflux disease and gastroparesis:  Change to      omeprazole 40 mg p.o. b.i.d. taken 30-60 minutes before breakfast and      dinner.  Continue metoclopramide 5 mg p.o. q.p.m.  Return office visit      in 12 months.  2. Personal history of adenomatous colon polyps:  Recall colonoscopy due      in May 2010.     Venita Lick. Russella Dar, MD, [facility]  Electronically Signed    MTS/MedQ  DD: 06/18/2006  DT: 06/18/2006  Job #: [id number]

## 2010-12-16 NOTE — Cardiovascular Report (Signed)
NAMETHAYDEN, LEMIRE NO.:  [id number]   MEDICAL RECORD NO.:  [id number]          PATIENT TYPE:  OIB   LOCATION:  2899                         FACILITY:  [facility]   PHYSICIAN:  Richard A. Alanda Amass, M.D.DATE OF BIRTH:  April 22, 1931   DATE OF PROCEDURE:  05/18/2005  DATE OF DISCHARGE:                              CARDIAC CATHETERIZATION   PROCEDURES:  1.  Retrograde central aortic catheterization.  2.  Selective right coronary angiography via Judkins technique pre and post      intracoronary nitroglycerin administration.  3.  Additional Plavix 150 mg, continued aspirin, double-bolus Aggrastat plus      infusion, weight adjusted heparin monitoring ACTs 6400 units.  4.  Cutting balloon atherectomy, high grade proximal-mid right coronary      artery stenosis.  5.  Graded balloon inflations, high-grade distal, mid and proximal-mid right      coronary artery lesions.  6.  Tandem nondrug-eluting stent chromium cobalt stenting, distal right      coronary artery 2.5/12 mm and 3.0/8 Guidant to mini Vision chromium      cobalt stents.  7.  Tandem drug-eluting stent Cypher overlapping stents, proximal-mid right      coronary artery, 2.5/13 plus 2.5/18 plus 2.5/13.   PROCEDURE:  The patient was brought to second floor CP lab in a  postabsorptive state after 5 mg Valium p.o. premedication.  Preoperative  laboratory showed normal CBC and differential, coagulation studies and BUN  and creatinine 18/0.9.  Foley catheter was in place because of prior  problems with urination and BPH.  Informed consent was obtained to proceed  with PCI with full risks and procedure fully discussed with the patient and  his wife after diagnostic angiography in [facility] May 12, 2005 (see report).   The LCFA was entered with a single anterior puncture using an 18 thin-wall  needle and a 6-French short Daig sidearm sheath was inserted without  difficulty.  Guidewire exchange was  used throughout the procedure.  A Cordis  6-French catheter was tried but did not fit the right coronary well, so it  was changed to a Scimed 6-French JR-4 guiding catheter.  The patient was  given a double-bolus Aggrastat plus infusion and an the extra 150 mg of  Plavix and weight-adjusted heparin, monitoring ACTs.  The high-grade  proximal-mid and distal RCA calcific stenoses were crossed initially with an  Asahi soft 0.014 inch guidewire.  Initial attempts at trying to cross with a  2.5/6 cutting balloon were not successful because of rigid, calcified,  tortuous vessel.  Because of marked the tortuosity and bends, it was not  felt that a rotational atherectomy, although an option, would be safe in  this setting  We then tried to cross with a 2.0/6 cutting balloon and were  able only able to get into the proximal stenoses.  This was cut on five inflations at 5-6 atmospheres for 30-40 seconds.  This  had minimal effect on opening the lesion, but the lumen did appear slightly  improved.  The baseline mid-right coronary lesion was 95%, eccentric, highly  calcific, and the  distal RCA lesion was 95% high-grade eccentric, calcific,  with mild poststenotic dilatation.   We then had to switch to a 2.0/15 Maverick balloon and were able to cross  the proximal lesion and dilate this at 14/40 but not cross the distal  lesion.  We then had to go to one 1.55/15 Maverick II Monorail balloon.  With this and slightly throating the soft-tip guiding catheter, I was able  to cross the distal stenosis and dilate this at 14/48.  It was brought back  in the proximal stenosis, was dilated at 9-29.   The balloon was then upgraded to a 2.0/15 Maverick and the distal and  proximal lesions were both dilated at 9-12 atmospheres for 30-40 seconds.   Because of disruption of the lesions, we did not want to then use a cutting  balloon because of risk of perforation, so cutting balloons were abandoned.  We then  upgraded the balloon to a 2.5/15 Maverick balloon and the distal and  proximal lesions were both dilated at 10-14 atmospheres for 35-68 seconds.  Intracoronary nitroglycerin was given in bolus fashion during the procedure  and IV nitroglycerin at 3 mcg/min. was infusing.  The patient was given a  total of 4 mg of Versed IV in divided doses, 50 mcg of fentanyl for sedation  during the procedure.   We then upgraded to a 2.75/12 Quantum Maverick balloon and dilated both the  proximal and distal lesions at 10-12 atmospheres proximally and 15  atmospheres distally for approximately 30 seconds.  Because of calcification and tortuosity gradient, upgraded balloon  dilatations were required for each lesion to cross with balloons alone.   We then tried to pass a Cypher 2.5/13 stent to the distal lesion and this  was not possible because of the marked tortuosity and inability to track.  We then used a buddy wire, which was a 0.014 inch Clinical cytogeneticist.  The buddy  wire was not successful and the Asahi soft wire was removed.  We were then  able to exchange for a 2.5/12 mini Vision chromium cobalt Guidant stent and  over the Prowater wire was able to cross the distal lesion and deployed this  at 6-30 and post dilated at 18 and 22 atmospheres. This same balloon was  used to dilate the proximal lesion at 20-22 atmospheres to aid in further  stent  passage.   The proximal lesion was then stented with a 2.5/13 Cypher stent from the mid  RCA back, which was positioned fluoroscopically and deployed at 20-35, post  dilated 20-26.  A second tandem 2.55/18 Cypher stent was then deployed in  the proximal third of the RCA overlapping the mid-stent and deployed at 20-  33 and 20-33.  Injection showed that there was residual narrowing just in  the area of post stenotic dilatation and haziness of the central distal  lesion.  There was also area of possible pseudodissection beyond the mid DES RCA stents that did  not resolve completely after removing the guidewire  and giving IC nitroglycerin.   The guiding catheter was then changed to a new 6-French JR-4 Scimed guiding  catheter.  The lesion was then recrossed with a new 0.014 inch Asahi soft  guidewire, which was free in the distal vessel.  Guidewire passage was aided by a 2.0 balloon, which was then removed.   We then able to pass a 3.0/8 mini Vision stent tandem to the distal stent  and overlapped and deployed this at 22-34 and post  dilate the overlap at 21-  21.   We then deployed a tandem 2.5/13 Cypher stent in the mid RCA in the area of  prior localized dissection beyond the stents.  This was positioned  fluoroscopically and deployed at 16-30, post dilated at 20-22.  The IC  nitroglycerin was administered and final injections were performed in  multiple projections.  This demonstrated calcific residual narrowing of 40%  throughout the proximal RCA but no evidence of guide dissection in an  undilated area.   The stented tandem stented mid area was still tortuous, but stent was well-  apposed and there was 20% residual narrowing in the proximal portion and 10%  or less in the midportion.  There was no dissection and good TIMI-3 flow.  To RV branches arising within the stents were intact.   Beyond that area there was 40% segmental narrowing just before the acute  margin that was not dilated.   The distal lesion was well-dilated with less than 10% residual, with 0%  narrowing and minor post stenotic dilatation and good stent apposition.  There was TIMI-3 flow throughout the RCA.  The PLA had multiple branches,  was intact.  The PDA was intact.  Both had diffuse disease.  There was 50%  PDA disease but good flow throughout.  The patient was stable and sinus  rhythm was maintained, blood pressures were monitored throughout the  procedure and ranged from 122-180 mmHg.  At the end of the procedure blood  pressure was 140.  Final ACT was  therapeutic.  The dilatation system was  removed.  Side-arm sheath was flushed, secured to the skin.  The patient  transferred to the holding area for postoperative care in stable condition.  We plan to continue a IIb/IIIa inhibitor with continued Aggrastat infusion,  aspirin and Plavix.  He will be monitored closely postoperatively.  If he is  stable, we will plan staged circumflex PCI.   Graeme is a 7 year old married father of two with four grandchildren, who is  an Technical sales engineer, has a prior history of cigarette abuse and quit smoking in  1970.  He has a long history of hypertension, hyperlipidemia, GERD, and a  family history of coronary disease with one brother with CABG at age 97.  He  was referred predominately because of exertional dyspnea and progressive  exertional fatigue as anginal equivalents.  He was found to have a positive  Cardiolite stress test with significant ischemia.  He underwent diagnostic coronary angiography May 12, 2005, and was found to have high-grade  stenosis of the proximal circumflex in the moderate-size nondominant vessel,  no significant LAD disease, 70% DX-2 and high-grade proximal and distal  dominant right coronary stenosis.  He had associated 40-50% left renal  artery narrowing and 30-40% distal aortic narrowing, 30% right common iliac  without claudication.   CATHETERIZATION DIAGNOSIS:  1.  Atherosclerotic heart disease:  Exertional dyspnea, fatigue, anginal      equivalents, with positive Cardiolite April 04, 2005.  2.  High-grade two-vessel coronary disease at diagnostic catheterization,      [facility], May 12, 2005.  3.  Successful complex percutaneous coronary intervention with tandem      chromium cobalt distal right coronary artery stenting and triple tandem      drug-eluting Cypher stenting proximal-mid right coronary artery,      calcific, tortuous vessel, diffusely diseased.  4.  Residual circumflex stenosis, staged  percutaneous coronary intervention      planned.  5.  Systemic hypertension.  6.  Hyperlipidemia.  7.  Quit smoker in 1970.  8.  Gastroesophageal reflux disease, stable.  9.  Benign prostatic hypertrophy, past prostate transurethral needle      ablation, remote umbilical hernia repair.  10. Systemic hypertension, mild bilateral renal artery stenosis (possible      right renal ostial stenosis, selective angiography pending).      Richard A. Alanda Amass, M.D.  Electronically Signed     RAW/MEDQ  D:  05/18/2005  T:  05/18/2005  Job:  [id number]   cc:   Cardiopulmonary Lab   Tera Mater. Evlyn Kanner, M.D.  Fax: [phone]   Maretta Bees. Vonita Moss, M.D.  Fax: [phone]

## 2010-12-16 NOTE — Cardiovascular Report (Signed)
NAMEBAYRON, DALTO NO.:  [id number]   MEDICAL RECORD NO.:  [id number]          PATIENT TYPE:  OIB   LOCATION:  2927                         FACILITY:  [facility]   PHYSICIAN:  Richard A. Alanda Amass, M.D.DATE OF BIRTH:  10/11/1930   DATE OF PROCEDURE:  05/19/2005  DATE OF DISCHARGE:                              CARDIAC CATHETERIZATION   PROCEDURE:  1.  Retrograde central aortic catheterization.  2.  Selective coronary angiography, pre and post intracoronary nitroglycerin      administration.  3.  Cutting balloon atherectomy, high-grade proximal calcific eccentric type      C circumflex stenosis with subsequent DES stenting, CYPHER 2.5/13, high-      pressure post deployment inflation.  4.  Selective right coronary angiography.  5.  Bilateral renal angiography, selective, using hand injections.   Please refer to complete PCI note of May 18, 2005 and outpatient  catheterization report of May 12, 2005.   INDICATION:  Mr. Summons is a 77 year old married gentleman, remote  smoker with hypertension and hyperlipidemia, who presented with anginal  equivalents and was found to have a strongly positive Cardiolite prompting  diagnostic catheterization, May 12, 2005, demonstrating high-grade  proximal circumflex and high-grade proximal-mid right and distal RCA disease  with noncritical left LAD-diagonal disease and normal LV function.  He was  started on aspirin and Plavix, was stable post catheterization and underwent  complex PCI on May 18, 2005 involving cutting balloon atherectomy along  with upgraded balloon dilatation in the distal, mid and proximal-mid right  coronary artery.  Ultimately, he required 2 tandem chromium cobalt stents in  the distal RCA and triple tandem CYPHER stents in the proximal-mid RCA.  He  had a good clinical outcome and angiographic result and has remained stable.  He was hydrated preoperatively.  H&H were stable at 12.7/36  and BUN and  creatinine were 9/0.8 on the morning of the procedure.  Informed consent was  obtained to proceed and he was brought to the second floor CP lab in the  postabsorptive state.  Five milligrams of Valium were given preop,  premedication, and during the procedure he received 3 mg of Versed in  divided doses, 5 mg of Lopressor and weight-adjusted heparin, 3500 units,  monitoring ACTs, along with intracoronary nitroglycerin administration.   The right groin was prepped, draped in usual manner, 1% Xylocaine was used  and the CRFA was entered with an anterior puncture using an 18 thin-walled  Smart needle and modified Seldinger technique.  A 6-French short Daig  sidearm sheath was inserted without difficulty.  The left coronary artery  was intubated with a 6-French XB 3.5 Cordis guiding catheter.  The proximal  circumflex lesion was crossed with a 0.014-inch ASAHI soft wire after the  ACT was therapeutic.  He was continued on Aggrastat infusion and heparin  3500 units was administered.  He had a 95% eccentric calcific stenosis of  the proximal circumflex across the small left atrial branch and just before  the PABG branch.  The first marginal branch appeared to be occluded at the  ostia, but did fill late, and  it was difficult to tell whether this was  totally occluded proximally and recanalized with collaterals or whether  there was slow flow.  It was elected to try to cross this.  We were able to  enter the ostia of the OM with an ASAHI light wire, but not cross it, and  this wire was exchanged to a hydrophilic 0.014 Choice PT2 wire.  Once again,  we were able to just enter the ostia of the OM, but not cross it, and it  responded as if this was a total occlusion.  The patient was not symptomatic  now or in the past with this and had collaterals, so we elected not to  proceed further.  The hydrophilic guide wire was removed.  The ASAHI soft  wire in the distal circumflex was retained  and the lesion was atherectomized  with a 2.5/10 cutting balloon at 6-48 and 7-48.  This was then exchanged for  a 2.5/13 CYPHER stent and the lesion was stented under fluoroscopic control  at 14-40, postdilated at 16-35 and 18-31 seconds.  IC nitroglycerin was  administered.  Final injection showed excellent angiographic result with  stenosis reduced from 90% to 95% to minus 10%, good TIMI-3 flow to the  circumflex.  The small left atrial branch was intact.  The PABG branch was  intact.  There was late filling of the OM branch, compatible with collateral  flow.  There was no chest pain or ST changes after balloon deflations.  The  dilatation system was removed and a right coronary angiography was then  performed with a diagnostic right coronary catheter; this demonstrated  persistent excellent result from complex PCI of May 18, 2005.  The  patient had 40% to 50% segmental narrowing at the proximal bend that was  calcific and quite stable, and unchanged.  The stented area from the  proximal to the mid RCA was widely patent and smooth with no significant  stenosis.  The 2 RV branches were intact and there was no dissection.   At the acute margin, there was another 50% stenosis which was unchanged and  smooth.  The distal RCA likewise was widely patent at the stented area with  good stent apposition and flow with mild 10% eccentric narrowing.  There was  excellent TIMI-3 flow throughout the right coronary artery.   Elective renal angiography was then done with the right coronary catheter  and this demonstrated a widely patent, essentially normal left renal artery.  The right renal artery had 30% to 40% ostial narrowing with good flow  representing mild ostial and proximal stenosis which can be followed with  duplex.  The patient's arterial pressures were monitored throughout the  procedure and did ranged from initially 210, after IV Lopressor 5 mg came down to 160 and he remained in  sinus rhythm.   The patient also had successful complex PCI of the RCA on May 18, 2005  and successful PCI of the high-grade circumflex on May 19, 2005.  I  would recommend continued medical therapy, vigorous lipid-lowering therapy  and medical and clinical followup.  Further followup of renal function,  surveillance Dopplers in the future and continued evaluation for prediabetic  state.   CATHETERIZATION DIAGNOSES:  1.  Arteriosclerotic heart disease - exertional dyspnea and fatigue anginal      equivalents with positive Cardiolite, prompting outpatient diagnostic      catheterization, May 12, 2005, demonstrating high-grade two-vessel      disease.  2.  Successful percutaneous coronary intervention, complex right coronary      artery, May 18, 2005.  3.  Successful percutaneous coronary intervention, high-grade proximal      circumflex, with successful stenting and persisted collaterals to old      occluded obtuse marginal 1.  4.  Normal left ventricular function.  5.  Hyperlipidemia.  6.  Systemic hypertension, essentially normal renal arteries.  7.  Gastroesophageal reflux disease.  8.  Remote prostate surgery.  9.  Mild elevated glucose.  10. Remote tobacco use.  11. History of umbilical hernia repair.  12. Hyperlipidemia.      Richard A. Alanda Amass, M.D.  Electronically Signed     RAW/MEDQ  D:  05/19/2005  T:  05/19/2005  Job:  [id number]   cc:   Jeannett Senior A. Evlyn Kanner, M.D.  Fax: [phone]   [facility]   Maretta Bees. Vonita Moss, M.D.  Fax: [phone]

## 2010-12-16 NOTE — Discharge Summary (Signed)
NAME:  Nathan Santiago, Nathan Santiago NO.:  [id number]   MEDICAL RECORD NO.:  [id number]          PATIENT TYPE:  OIB   LOCATION:  3711                         FACILITY:  [facility]   PHYSICIAN:  Richard A. Alanda Amass, M.D.DATE OF BIRTH:  1931/07/12   DATE OF ADMISSION:  05/18/2005  DATE OF DISCHARGE:  05/21/2005                                 DISCHARGE SUMMARY   ADMISSION DIAGNOSES:  1.  Exertional dyspnea and lethargy.  2.  Positive Cardiolite at outpatient catheterization, [facility] with right coronary artery and circumflex disease, left ventricle      greater than 60% ejection fraction.  3.  Hypertension.  4.  Hypercholesterolemia.  5.  Tobacco abuse.  6.  Gastroesophageal reflux disease.  7.  Benign prostatic hypertrophy with history of microwave therapy.  8.  Umbilical hernia.  9.  Positive family history.   DISCHARGE DIAGNOSES:  1.  Exertional dyspnea and lethargy.  2.  Positive Cardiolite at outpatient catheterization, [facility] with right coronary artery and circumflex disease, left ventricle      greater than 60% ejection fraction.  3.  Hypertension.  4.  Hypercholesterolemia.  5.  Tobacco abuse.  6.  Benign prostatic hypertrophy with history of microwave therapy.  7.  Umbilical hernia.  8.  Positive family history.   PROCEDURE:  1.  Complex catheterization PCI of the RCA with Cypher and multi-link      stents, total of 4, May 18, 2005, Dr. Alanda Amass.  2.  Cardiac catheterization with PTA of the circumflex with Cypher stent on      May 19, 2005, Dr. Alanda Amass.   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 He was seen by Dr. Evlyn Kanner and  complained of being lethargic for 6 months. In addition to this, he plays  golf every week for 1 afternoon. For the last 6 to 12 months, he has found  that he could not carry his bags and would ride from hole to hole,  which was  completely new for him. Dr. Evlyn Kanner ordered a Cardiolite study, which was  found to be positive. His EKG showed 3 to 3.5 mm ST depression in inferior  leads V4 through V6. Blood pressure was 210/98 and he developed moderate  fatigue at stage 2. He was seen in the office by Dr. Alanda Amass and scheduled  for an outpatient cardiac catheterization. Catheterization revealed a high  grade circumflex right coronary artery disease with normal LV function. He  was scheduled for admission at this time for further treatment and  intervention.   PAST MEDICAL HISTORY:  1.  Hypertension for 10 years  2.  Hypercholesterolemia.  3.  History of tobacco use.  4.  GERD.  5.  BPH.  6.  History of umbilical hernia.  7.  Positive family history.   SOCIAL HISTORY:  Married. Two children and 4 grandchildren. 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 He drinks wine occasionally. Smoked  for 30 years, 1 and 1/2 to 2 packs per day. Quit in 1970. Exercises by  walking.   ADMISSION MEDICATIONS:  Included Atenolol 25 mg daily, aspirin 81 mg daily,  Plavix 75 mg daily, Vytorin 1040 1 daily, hydrochlorothiazide 25 mg daily,  Micardis 20 mg daily, Nexium 40 mg daily.   ALLERGIES:  LISINOPRIL (causes a cough).   HOSPITAL COURSE:  The patient was admitted and on May 18, 2005 underwent  complex PCI with a total of 4 stents placed in his RCA, 3 proximal and 1  distal. Tolerated this well. Returned to the floor. He was taken back to the  cath lab on May 19, 2005 and underwent PCI to his circumflex. A proximal  90% to 95% lesion was reduced to about 10%. He tolerated this well. He was  monitored for another 24 hours and had no problems. Total cholesterol was  126, triglycerides 205, LDL is 48, HDL is 37.   DISCHARGE LABORATORY DATA:  Hemoglobin 11.9, hematocrit 34. White count 8.8.  Platelets 211,000. Electrolytes are normal. BUN is 10, creatinine is 1,  glucose is 106.   DISCHARGE  MEDICATIONS:  1.  Atenolol 50 mg daily. This was increased for his elevated heart rate.  2.  Aspirin 325 mg daily.  3.  Plavix 75 mg daily.  4.  Vytorin 1040 1 daily.  5.  Hydrochlorothiazide 25 mg daily.  6.  Micardis 20 mg daily.  7.  Nexium 40 mg daily.  8.  We are also recommending a multivitamin with iron.   DIET:  Low-fat, low-salt.   ACTIVITY:  Light to moderate for the next 72 hours, then he can resume his  previous activities.   FOLLOW UP:  He will followup with Dr. Alanda Amass in approximately 2 weeks.  Our office will call. He is to contact Dr. Evlyn Kanner for followup with him also.      Eber Hong, P.A.      Richard A. Alanda Amass, M.D.  Electronically Signed    WDJ/MEDQ  D:  05/21/2005  T:  05/21/2005  Job:  [id number]

## 2011-04-11 ENCOUNTER — Encounter: Payer: Self-pay | Admitting: Vascular Surgery

## 2011-05-24 ENCOUNTER — Ambulatory Visit: Payer: Medicare Other | Admitting: Vascular Surgery

## 2011-05-24 ENCOUNTER — Other Ambulatory Visit: Payer: Medicare Other

## 2011-05-30 ENCOUNTER — Encounter: Payer: Self-pay | Admitting: Vascular Surgery

## 2011-05-31 ENCOUNTER — Ambulatory Visit: Payer: Medicare Other | Admitting: Vascular Surgery

## 2011-05-31 ENCOUNTER — Other Ambulatory Visit: Payer: Medicare Other

## 2011-06-01 ENCOUNTER — Encounter: Payer: Self-pay | Admitting: Cardiovascular Disease

## 2011-08-28 DIAGNOSIS — I714 Abdominal aortic aneurysm, without rupture, unspecified: Secondary | ICD-10-CM | POA: Diagnosis not present

## 2011-08-28 DIAGNOSIS — I2510 Atherosclerotic heart disease of native coronary artery without angina pectoris: Secondary | ICD-10-CM | POA: Diagnosis not present

## 2011-08-28 DIAGNOSIS — I10 Essential (primary) hypertension: Secondary | ICD-10-CM | POA: Diagnosis not present

## 2011-08-28 DIAGNOSIS — D126 Benign neoplasm of colon, unspecified: Secondary | ICD-10-CM | POA: Diagnosis not present

## 2011-10-25 DIAGNOSIS — I6529 Occlusion and stenosis of unspecified carotid artery: Secondary | ICD-10-CM | POA: Diagnosis not present

## 2011-11-16 DIAGNOSIS — M899 Disorder of bone, unspecified: Secondary | ICD-10-CM | POA: Diagnosis not present

## 2011-11-16 DIAGNOSIS — M542 Cervicalgia: Secondary | ICD-10-CM | POA: Diagnosis not present

## 2011-11-16 DIAGNOSIS — M949 Disorder of cartilage, unspecified: Secondary | ICD-10-CM | POA: Diagnosis not present

## 2011-12-05 DIAGNOSIS — M542 Cervicalgia: Secondary | ICD-10-CM | POA: Diagnosis not present

## 2011-12-05 DIAGNOSIS — M899 Disorder of bone, unspecified: Secondary | ICD-10-CM | POA: Diagnosis not present

## 2011-12-05 DIAGNOSIS — M949 Disorder of cartilage, unspecified: Secondary | ICD-10-CM | POA: Diagnosis not present

## 2011-12-13 DIAGNOSIS — M542 Cervicalgia: Secondary | ICD-10-CM | POA: Diagnosis not present

## 2011-12-27 DIAGNOSIS — M542 Cervicalgia: Secondary | ICD-10-CM | POA: Diagnosis not present

## 2011-12-30 DIAGNOSIS — M542 Cervicalgia: Secondary | ICD-10-CM | POA: Diagnosis not present

## 2012-01-10 DIAGNOSIS — M5030 Other cervical disc degeneration, unspecified cervical region: Secondary | ICD-10-CM | POA: Diagnosis not present

## 2012-01-12 DIAGNOSIS — M542 Cervicalgia: Secondary | ICD-10-CM | POA: Diagnosis not present

## 2012-01-16 DIAGNOSIS — M542 Cervicalgia: Secondary | ICD-10-CM | POA: Diagnosis not present

## 2012-01-16 DIAGNOSIS — I70219 Atherosclerosis of native arteries of extremities with intermittent claudication, unspecified extremity: Secondary | ICD-10-CM | POA: Diagnosis not present

## 2012-01-16 HISTORY — PX: OTHER SURGICAL HISTORY: SHX169

## 2012-01-22 DIAGNOSIS — H52209 Unspecified astigmatism, unspecified eye: Secondary | ICD-10-CM | POA: Diagnosis not present

## 2012-01-22 DIAGNOSIS — Z961 Presence of intraocular lens: Secondary | ICD-10-CM | POA: Diagnosis not present

## 2012-01-23 DIAGNOSIS — I739 Peripheral vascular disease, unspecified: Secondary | ICD-10-CM | POA: Diagnosis not present

## 2012-01-23 DIAGNOSIS — E782 Mixed hyperlipidemia: Secondary | ICD-10-CM | POA: Diagnosis not present

## 2012-01-23 DIAGNOSIS — I2510 Atherosclerotic heart disease of native coronary artery without angina pectoris: Secondary | ICD-10-CM | POA: Diagnosis not present

## 2012-01-24 DIAGNOSIS — Z79899 Other long term (current) drug therapy: Secondary | ICD-10-CM | POA: Diagnosis not present

## 2012-01-24 DIAGNOSIS — R5381 Other malaise: Secondary | ICD-10-CM | POA: Diagnosis not present

## 2012-01-24 DIAGNOSIS — R6889 Other general symptoms and signs: Secondary | ICD-10-CM | POA: Diagnosis not present

## 2012-01-24 DIAGNOSIS — N429 Disorder of prostate, unspecified: Secondary | ICD-10-CM | POA: Diagnosis not present

## 2012-01-24 DIAGNOSIS — M542 Cervicalgia: Secondary | ICD-10-CM | POA: Diagnosis not present

## 2012-01-24 DIAGNOSIS — R5383 Other fatigue: Secondary | ICD-10-CM | POA: Diagnosis not present

## 2012-02-07 DIAGNOSIS — I2510 Atherosclerotic heart disease of native coronary artery without angina pectoris: Secondary | ICD-10-CM | POA: Diagnosis not present

## 2012-02-07 HISTORY — PX: DOPPLER ECHOCARDIOGRAPHY: SHX263

## 2012-02-26 DIAGNOSIS — I714 Abdominal aortic aneurysm, without rupture, unspecified: Secondary | ICD-10-CM | POA: Diagnosis not present

## 2012-02-26 DIAGNOSIS — I2510 Atherosclerotic heart disease of native coronary artery without angina pectoris: Secondary | ICD-10-CM | POA: Diagnosis not present

## 2012-02-26 DIAGNOSIS — I6529 Occlusion and stenosis of unspecified carotid artery: Secondary | ICD-10-CM | POA: Diagnosis not present

## 2012-02-26 DIAGNOSIS — D126 Benign neoplasm of colon, unspecified: Secondary | ICD-10-CM | POA: Diagnosis not present

## 2012-04-09 DIAGNOSIS — Z23 Encounter for immunization: Secondary | ICD-10-CM | POA: Diagnosis not present

## 2012-04-11 DIAGNOSIS — I6529 Occlusion and stenosis of unspecified carotid artery: Secondary | ICD-10-CM | POA: Diagnosis not present

## 2012-06-13 DIAGNOSIS — Z23 Encounter for immunization: Secondary | ICD-10-CM | POA: Diagnosis not present

## 2012-06-19 DIAGNOSIS — M545 Low back pain, unspecified: Secondary | ICD-10-CM | POA: Diagnosis not present

## 2012-07-01 DIAGNOSIS — I10 Essential (primary) hypertension: Secondary | ICD-10-CM | POA: Diagnosis not present

## 2012-07-01 DIAGNOSIS — I499 Cardiac arrhythmia, unspecified: Secondary | ICD-10-CM | POA: Diagnosis not present

## 2012-07-16 DIAGNOSIS — I739 Peripheral vascular disease, unspecified: Secondary | ICD-10-CM | POA: Diagnosis not present

## 2012-07-16 DIAGNOSIS — I2510 Atherosclerotic heart disease of native coronary artery without angina pectoris: Secondary | ICD-10-CM | POA: Diagnosis not present

## 2012-07-16 DIAGNOSIS — I10 Essential (primary) hypertension: Secondary | ICD-10-CM | POA: Diagnosis not present

## 2012-07-17 DIAGNOSIS — Z125 Encounter for screening for malignant neoplasm of prostate: Secondary | ICD-10-CM | POA: Diagnosis not present

## 2012-07-17 DIAGNOSIS — E785 Hyperlipidemia, unspecified: Secondary | ICD-10-CM | POA: Diagnosis not present

## 2012-07-17 DIAGNOSIS — I10 Essential (primary) hypertension: Secondary | ICD-10-CM | POA: Diagnosis not present

## 2012-08-14 DIAGNOSIS — M545 Low back pain, unspecified: Secondary | ICD-10-CM | POA: Diagnosis not present

## 2012-08-22 DIAGNOSIS — M545 Low back pain, unspecified: Secondary | ICD-10-CM | POA: Diagnosis not present

## 2012-08-28 DIAGNOSIS — I714 Abdominal aortic aneurysm, without rupture, unspecified: Secondary | ICD-10-CM | POA: Diagnosis not present

## 2012-08-28 DIAGNOSIS — I10 Essential (primary) hypertension: Secondary | ICD-10-CM | POA: Diagnosis not present

## 2012-08-28 DIAGNOSIS — Z1331 Encounter for screening for depression: Secondary | ICD-10-CM | POA: Diagnosis not present

## 2012-08-28 DIAGNOSIS — I2510 Atherosclerotic heart disease of native coronary artery without angina pectoris: Secondary | ICD-10-CM | POA: Diagnosis not present

## 2012-09-20 DIAGNOSIS — M545 Low back pain, unspecified: Secondary | ICD-10-CM | POA: Diagnosis not present

## 2012-10-02 ENCOUNTER — Encounter: Payer: Self-pay | Admitting: Gastroenterology

## 2012-10-02 ENCOUNTER — Ambulatory Visit (INDEPENDENT_AMBULATORY_CARE_PROVIDER_SITE_OTHER): Payer: Medicare Other | Admitting: Gastroenterology

## 2012-10-02 VITALS — BP 140/64 | HR 68 | Ht 64.0 in | Wt 166.0 lb

## 2012-10-02 DIAGNOSIS — K3184 Gastroparesis: Secondary | ICD-10-CM

## 2012-10-02 DIAGNOSIS — Z860100 Personal history of colon polyps, unspecified: Secondary | ICD-10-CM

## 2012-10-02 DIAGNOSIS — K219 Gastro-esophageal reflux disease without esophagitis: Secondary | ICD-10-CM | POA: Diagnosis not present

## 2012-10-02 NOTE — Patient Instructions (Addendum)
You have been given a Low gas diet and ant-reflux measures.   Use Gas-X four times a day as needed for gas and bloating.  Patient advised to avoid spicy, acidic, citrus, chocolate, mints, fruit and fruit juices.  Limit the intake of caffeine, alcohol and Soda.  Don't exercise too soon after eating.  Don't lie down within 3-4 hours of eating.  Elevate the head of your bed.  Continue omeprazole one tablet by mouth twice daily.   Please try to stop Reglan.   Thank you for choosing me and [facility].  Venita Lick. Pleas Koch., MD., Clementeen Graham  cc: Adrian Prince, MD

## 2012-10-02 NOTE — Progress Notes (Signed)
History of Present Illness: This is a 79 year old male with GERD and LPR. His symptoms have generally been well controlled on omeprazole 40 mg twice daily however he notes some throat clearing and hoarseness just for the past week or two.  Current Medications, Allergies, Past Medical History, Past Surgical History, Family History and Social History were reviewed in [employer] record.  Physical Exam: General: Well developed , well nourished, no acute distress Head: Normocephalic and atraumatic Eyes:  sclerae anicteric, EOMI Ears: Normal auditory acuity Mouth: No deformity or lesions Lungs: Clear throughout to auscultation Heart: Regular rate and rhythm; no murmurs, rubs or bruits Abdomen: Soft, non tender and non distended. No masses, hepatosplenomegaly or hernias noted. Normal Bowel sounds Musculoskeletal: Symmetrical with no gross deformities  Pulses:  Normal pulses noted Extremities: No clubbing, cyanosis, edema or deformities noted Neurological: Alert oriented x 4, grossly nonfocal Psychological:  Alert and cooperative. Normal mood and affect  Assessment and Recommendations:  1. GERD with LPR and gastroparesis. Continue antireflux measures, a gastroparesis diet and omeprazole 40 mg po bid. DC Reglan. His LPR symptoms persist consider changing to a stronger PPI BID.  2. Personal history of adenomatous colon polyps. No plans for future surveillance or screening colonoscopies given age.

## 2013-01-21 ENCOUNTER — Other Ambulatory Visit (HOSPITAL_COMMUNITY): Payer: Self-pay | Admitting: Cardiovascular Disease

## 2013-01-21 DIAGNOSIS — I25810 Atherosclerosis of coronary artery bypass graft(s) without angina pectoris: Secondary | ICD-10-CM

## 2013-01-21 DIAGNOSIS — I739 Peripheral vascular disease, unspecified: Secondary | ICD-10-CM

## 2013-01-22 DIAGNOSIS — Z961 Presence of intraocular lens: Secondary | ICD-10-CM | POA: Diagnosis not present

## 2013-01-22 DIAGNOSIS — H52209 Unspecified astigmatism, unspecified eye: Secondary | ICD-10-CM | POA: Diagnosis not present

## 2013-01-24 ENCOUNTER — Ambulatory Visit (HOSPITAL_COMMUNITY)
Admission: RE | Admit: 2013-01-24 | Discharge: 2013-01-24 | Disposition: A | Discharge status: Home / Self Care | Payer: Medicare Other | Source: Ambulatory Visit | Attending: Cardiovascular Disease | Admitting: Cardiovascular Disease

## 2013-01-24 DIAGNOSIS — I25810 Atherosclerosis of coronary artery bypass graft(s) without angina pectoris: Secondary | ICD-10-CM | POA: Insufficient documentation

## 2013-01-24 DIAGNOSIS — I6529 Occlusion and stenosis of unspecified carotid artery: Secondary | ICD-10-CM | POA: Diagnosis not present

## 2013-01-24 HISTORY — PX: OTHER SURGICAL HISTORY: SHX169

## 2013-01-24 NOTE — Progress Notes (Signed)
Carotid Duplex Completed. Latreshia Beauchaine, RDMS, RVT  

## 2013-02-04 ENCOUNTER — Ambulatory Visit (HOSPITAL_COMMUNITY)
Admission: RE | Admit: 2013-02-04 | Discharge: 2013-02-04 | Disposition: A | Discharge status: Home / Self Care | Payer: Medicare Other | Source: Ambulatory Visit | Attending: Cardiovascular Disease | Admitting: Cardiovascular Disease

## 2013-02-04 ENCOUNTER — Other Ambulatory Visit: Payer: Self-pay | Admitting: *Deleted

## 2013-02-04 DIAGNOSIS — I10 Essential (primary) hypertension: Secondary | ICD-10-CM | POA: Diagnosis not present

## 2013-02-04 DIAGNOSIS — I6529 Occlusion and stenosis of unspecified carotid artery: Secondary | ICD-10-CM | POA: Diagnosis not present

## 2013-02-04 DIAGNOSIS — I739 Peripheral vascular disease, unspecified: Secondary | ICD-10-CM | POA: Insufficient documentation

## 2013-02-04 DIAGNOSIS — I2510 Atherosclerotic heart disease of native coronary artery without angina pectoris: Secondary | ICD-10-CM | POA: Diagnosis not present

## 2013-02-04 DIAGNOSIS — I70219 Atherosclerosis of native arteries of extremities with intermittent claudication, unspecified extremity: Secondary | ICD-10-CM | POA: Diagnosis not present

## 2013-02-04 DIAGNOSIS — E782 Mixed hyperlipidemia: Secondary | ICD-10-CM | POA: Diagnosis not present

## 2013-02-04 HISTORY — PX: OTHER SURGICAL HISTORY: SHX169

## 2013-02-04 NOTE — Progress Notes (Signed)
Arterial Duplex Lower Ext. Completed. Elai Vanwyk, RDMS, RVT  

## 2013-02-05 DIAGNOSIS — E785 Hyperlipidemia, unspecified: Secondary | ICD-10-CM | POA: Diagnosis not present

## 2013-02-05 DIAGNOSIS — I10 Essential (primary) hypertension: Secondary | ICD-10-CM | POA: Diagnosis not present

## 2013-02-05 DIAGNOSIS — Z0000 Encounter for general adult medical examination without abnormal findings: Secondary | ICD-10-CM | POA: Diagnosis not present

## 2013-02-05 DIAGNOSIS — I701 Atherosclerosis of renal artery: Secondary | ICD-10-CM | POA: Diagnosis not present

## 2013-02-07 ENCOUNTER — Encounter: Payer: Self-pay | Admitting: Cardiovascular Disease

## 2013-02-26 DIAGNOSIS — D126 Benign neoplasm of colon, unspecified: Secondary | ICD-10-CM | POA: Diagnosis not present

## 2013-02-26 DIAGNOSIS — I714 Abdominal aortic aneurysm, without rupture, unspecified: Secondary | ICD-10-CM | POA: Diagnosis not present

## 2013-02-26 DIAGNOSIS — I10 Essential (primary) hypertension: Secondary | ICD-10-CM | POA: Diagnosis not present

## 2013-02-26 DIAGNOSIS — E785 Hyperlipidemia, unspecified: Secondary | ICD-10-CM | POA: Diagnosis not present

## 2013-02-26 DIAGNOSIS — I2510 Atherosclerotic heart disease of native coronary artery without angina pectoris: Secondary | ICD-10-CM | POA: Diagnosis not present

## 2013-02-26 DIAGNOSIS — I6529 Occlusion and stenosis of unspecified carotid artery: Secondary | ICD-10-CM | POA: Diagnosis not present

## 2013-02-26 DIAGNOSIS — I739 Peripheral vascular disease, unspecified: Secondary | ICD-10-CM | POA: Diagnosis not present

## 2013-02-26 DIAGNOSIS — K219 Gastro-esophageal reflux disease without esophagitis: Secondary | ICD-10-CM | POA: Diagnosis not present

## 2013-02-27 DIAGNOSIS — M719 Bursopathy, unspecified: Secondary | ICD-10-CM | POA: Diagnosis not present

## 2013-02-27 DIAGNOSIS — M6790 Unspecified disorder of synovium and tendon, unspecified site: Secondary | ICD-10-CM | POA: Diagnosis not present

## 2013-04-23 DIAGNOSIS — Z23 Encounter for immunization: Secondary | ICD-10-CM | POA: Diagnosis not present

## 2013-04-28 DIAGNOSIS — I10 Essential (primary) hypertension: Secondary | ICD-10-CM | POA: Diagnosis not present

## 2013-04-28 DIAGNOSIS — R42 Dizziness and giddiness: Secondary | ICD-10-CM | POA: Diagnosis not present

## 2013-04-28 DIAGNOSIS — I6529 Occlusion and stenosis of unspecified carotid artery: Secondary | ICD-10-CM | POA: Diagnosis not present

## 2013-05-06 DIAGNOSIS — K219 Gastro-esophageal reflux disease without esophagitis: Secondary | ICD-10-CM | POA: Diagnosis not present

## 2013-05-06 DIAGNOSIS — R269 Unspecified abnormalities of gait and mobility: Secondary | ICD-10-CM | POA: Diagnosis not present

## 2013-05-06 DIAGNOSIS — N210 Calculus in bladder: Secondary | ICD-10-CM | POA: Diagnosis not present

## 2013-05-06 DIAGNOSIS — D126 Benign neoplasm of colon, unspecified: Secondary | ICD-10-CM | POA: Diagnosis not present

## 2013-05-06 DIAGNOSIS — E785 Hyperlipidemia, unspecified: Secondary | ICD-10-CM | POA: Diagnosis not present

## 2013-05-06 DIAGNOSIS — R7309 Other abnormal glucose: Secondary | ICD-10-CM | POA: Diagnosis not present

## 2013-05-06 DIAGNOSIS — I739 Peripheral vascular disease, unspecified: Secondary | ICD-10-CM | POA: Diagnosis not present

## 2013-05-06 DIAGNOSIS — I701 Atherosclerosis of renal artery: Secondary | ICD-10-CM | POA: Diagnosis not present

## 2013-05-07 DIAGNOSIS — I679 Cerebrovascular disease, unspecified: Secondary | ICD-10-CM | POA: Diagnosis not present

## 2013-05-28 DIAGNOSIS — I69998 Other sequelae following unspecified cerebrovascular disease: Secondary | ICD-10-CM | POA: Diagnosis not present

## 2013-05-28 DIAGNOSIS — R42 Dizziness and giddiness: Secondary | ICD-10-CM | POA: Diagnosis not present

## 2013-06-04 DIAGNOSIS — I69998 Other sequelae following unspecified cerebrovascular disease: Secondary | ICD-10-CM | POA: Diagnosis not present

## 2013-06-04 DIAGNOSIS — R42 Dizziness and giddiness: Secondary | ICD-10-CM | POA: Diagnosis not present

## 2013-06-11 DIAGNOSIS — R42 Dizziness and giddiness: Secondary | ICD-10-CM | POA: Diagnosis not present

## 2013-06-11 DIAGNOSIS — I69998 Other sequelae following unspecified cerebrovascular disease: Secondary | ICD-10-CM | POA: Diagnosis not present

## 2013-06-13 DIAGNOSIS — R42 Dizziness and giddiness: Secondary | ICD-10-CM | POA: Diagnosis not present

## 2013-06-13 DIAGNOSIS — I69998 Other sequelae following unspecified cerebrovascular disease: Secondary | ICD-10-CM | POA: Diagnosis not present

## 2013-06-17 DIAGNOSIS — I69998 Other sequelae following unspecified cerebrovascular disease: Secondary | ICD-10-CM | POA: Diagnosis not present

## 2013-06-17 DIAGNOSIS — R42 Dizziness and giddiness: Secondary | ICD-10-CM | POA: Diagnosis not present

## 2013-06-20 DIAGNOSIS — I69998 Other sequelae following unspecified cerebrovascular disease: Secondary | ICD-10-CM | POA: Diagnosis not present

## 2013-06-20 DIAGNOSIS — R42 Dizziness and giddiness: Secondary | ICD-10-CM | POA: Diagnosis not present

## 2013-06-24 DIAGNOSIS — R42 Dizziness and giddiness: Secondary | ICD-10-CM | POA: Diagnosis not present

## 2013-06-24 DIAGNOSIS — I69998 Other sequelae following unspecified cerebrovascular disease: Secondary | ICD-10-CM | POA: Diagnosis not present

## 2013-07-01 DIAGNOSIS — R42 Dizziness and giddiness: Secondary | ICD-10-CM | POA: Diagnosis not present

## 2013-07-01 DIAGNOSIS — I69998 Other sequelae following unspecified cerebrovascular disease: Secondary | ICD-10-CM | POA: Diagnosis not present

## 2013-07-04 DIAGNOSIS — I69998 Other sequelae following unspecified cerebrovascular disease: Secondary | ICD-10-CM | POA: Diagnosis not present

## 2013-07-04 DIAGNOSIS — R42 Dizziness and giddiness: Secondary | ICD-10-CM | POA: Diagnosis not present

## 2013-07-08 DIAGNOSIS — R42 Dizziness and giddiness: Secondary | ICD-10-CM | POA: Diagnosis not present

## 2013-07-08 DIAGNOSIS — I69998 Other sequelae following unspecified cerebrovascular disease: Secondary | ICD-10-CM | POA: Diagnosis not present

## 2013-07-11 DIAGNOSIS — I69998 Other sequelae following unspecified cerebrovascular disease: Secondary | ICD-10-CM | POA: Diagnosis not present

## 2013-07-11 DIAGNOSIS — R42 Dizziness and giddiness: Secondary | ICD-10-CM | POA: Diagnosis not present

## 2013-07-16 DIAGNOSIS — R42 Dizziness and giddiness: Secondary | ICD-10-CM | POA: Diagnosis not present

## 2013-07-16 DIAGNOSIS — I69998 Other sequelae following unspecified cerebrovascular disease: Secondary | ICD-10-CM | POA: Diagnosis not present

## 2013-07-18 DIAGNOSIS — I69998 Other sequelae following unspecified cerebrovascular disease: Secondary | ICD-10-CM | POA: Diagnosis not present

## 2013-07-18 DIAGNOSIS — R42 Dizziness and giddiness: Secondary | ICD-10-CM | POA: Diagnosis not present

## 2013-07-21 DIAGNOSIS — I69998 Other sequelae following unspecified cerebrovascular disease: Secondary | ICD-10-CM | POA: Diagnosis not present

## 2013-07-21 DIAGNOSIS — R42 Dizziness and giddiness: Secondary | ICD-10-CM | POA: Diagnosis not present

## 2013-07-28 DIAGNOSIS — I69998 Other sequelae following unspecified cerebrovascular disease: Secondary | ICD-10-CM | POA: Diagnosis not present

## 2013-07-28 DIAGNOSIS — R42 Dizziness and giddiness: Secondary | ICD-10-CM | POA: Diagnosis not present

## 2013-08-04 DIAGNOSIS — R42 Dizziness and giddiness: Secondary | ICD-10-CM | POA: Diagnosis not present

## 2013-08-04 DIAGNOSIS — I69998 Other sequelae following unspecified cerebrovascular disease: Secondary | ICD-10-CM | POA: Diagnosis not present

## 2013-08-06 ENCOUNTER — Other Ambulatory Visit: Payer: Self-pay | Admitting: Dermatology

## 2013-08-06 ENCOUNTER — Encounter: Payer: Self-pay | Admitting: Cardiovascular Disease

## 2013-08-06 ENCOUNTER — Ambulatory Visit (INDEPENDENT_AMBULATORY_CARE_PROVIDER_SITE_OTHER): Payer: Medicare Other | Admitting: Cardiovascular Disease

## 2013-08-06 VITALS — BP 128/54 | HR 64 | Ht 64.75 in | Wt 168.0 lb

## 2013-08-06 DIAGNOSIS — E7800 Pure hypercholesterolemia, unspecified: Secondary | ICD-10-CM

## 2013-08-06 DIAGNOSIS — I739 Peripheral vascular disease, unspecified: Secondary | ICD-10-CM | POA: Diagnosis not present

## 2013-08-06 DIAGNOSIS — L570 Actinic keratosis: Secondary | ICD-10-CM | POA: Diagnosis not present

## 2013-08-06 DIAGNOSIS — L821 Other seborrheic keratosis: Secondary | ICD-10-CM | POA: Diagnosis not present

## 2013-08-06 DIAGNOSIS — D485 Neoplasm of uncertain behavior of skin: Secondary | ICD-10-CM | POA: Diagnosis not present

## 2013-08-06 DIAGNOSIS — I2510 Atherosclerotic heart disease of native coronary artery without angina pectoris: Secondary | ICD-10-CM | POA: Diagnosis not present

## 2013-08-06 DIAGNOSIS — I10 Essential (primary) hypertension: Secondary | ICD-10-CM | POA: Diagnosis not present

## 2013-08-06 NOTE — Assessment & Plan Note (Signed)
Well-controlled on current medications 

## 2013-08-06 NOTE — Progress Notes (Signed)
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 He was formally a patient of Nathan Santiago. I am assuming his care. His cardiovascular status profile is remarkable for remote tobacco abuse, 2 hypertension and hyperlipidemia. He is not diabetic. He does exercise [place] a [place] 5 days a week 25 minutes a day. He hasn't known cardiovascular disease status post stenting of his proximal mid and distal RCA using overlapping stents as well as the proximal AV groove circumflex with a known occluded OM branch which is old. He has normal LV function by 2-D echo performed 02/07/12 a nonischemic Myoview 2010. He denies chest pain, shortness of breath or claudication.   Current Outpatient Prescriptions  Medication Sig Dispense Refill  . amLODipine (NORVASC) 5 MG tablet Take 5 mg by mouth daily.        . Ascorbic Acid (VITAMIN C PO) Take by mouth.        [name] aspirin 81 MG tablet Take 81 mg by mouth daily.        [name] atenolol (TENORMIN) 50 MG tablet Take 50 mg by mouth daily.        [name] atorvastatin (LIPITOR) 40 MG tablet Take 20 mg by mouth daily at 6 PM.       . BIOTIN PO Take by mouth.        [name] CALCIUM CITRATE PO Take by mouth.        . Cholecalciferol (VITAMIN D-3 PO) Take 1,000 Units by mouth daily.       . cilostazol (PLETAL) 100 MG tablet Take 100 mg by mouth 2 (two) times daily.        . clopidogrel (PLAVIX) 75 MG tablet Take 75 mg by mouth daily.        . diazepam (VALIUM) 5 MG tablet Take 5 mg by mouth every 8 (eight) hours as needed for anxiety.      . Evening Primrose topical oil Apply topically as needed.        . fish oil-omega-3 fatty acids 1000 MG capsule Take 2 g by mouth daily.         [name] GLUCOSAMINE PO Take 1,500 mg by mouth daily.       . hydrochlorothiazide 25 MG tablet Take 25 mg by mouth daily.        [name] losartan (COZAAR) 50 MG tablet Take 50 mg by mouth daily.      . metoCLOPramide (REGLAN) 5 MG tablet Take 5 mg by mouth daily.        . Multiple Vitamin (MULTIVITAMIN PO) Take by mouth.        [name] omeprazole (PRILOSEC) 40 MG capsule Take 20 mg by mouth 2 (two) times daily.       . potassium chloride SA (K-DUR,KLOR-CON) 20 MEQ tablet Take 20 mEq by mouth once.       . terazosin (HYTRIN) 5 MG capsule Take 5 mg by mouth at bedtime.       No current facility-administered medications for this visit.    Allergies  Allergen Reactions  . Lisinopril Cough  . Niaspan [Niacin Er] Rash    History   Social History  . Marital Status: Married    Spouse Name:  N/A    Number of Children: N/A  . Years of Education: N/A   Occupational History  . Not on file.   Social History Main Topics  . Smoking status: Former Smoker    Quit date: 07/31/1968  . Smokeless tobacco: Never Used  . Alcohol Use: 2.4 oz/week    4 Glasses of wine per week  . Drug Use: Not on file  . Sexual Activity: Not on file   Other Topics Concern  . Not on file   Social History Narrative  . No narrative on file     Review of Systems: General: negative for chills, fever, night sweats or weight changes.  Cardiovascular: negative for chest pain, dyspnea on exertion, edema, orthopnea, palpitations, paroxysmal nocturnal dyspnea or shortness of breath Dermatological: negative for rash Respiratory: negative for cough or wheezing Urologic: negative for hematuria Abdominal: negative for nausea, vomiting, diarrhea, bright red blood per rectum, melena, or hematemesis Neurologic: negative for visual changes, syncope, or dizziness All other systems reviewed and are otherwise negative except as noted above.    Blood pressure 128/54, pulse 64, height 5' 4.75" (1.645 m), weight 168 lb (76.204 kg).  General  appearance: alert and no distress Neck: no adenopathy, no JVD, supple, symmetrical, trachea midline, thyroid not enlarged, symmetric, no tenderness/mass/nodules and bilateral carotid bruits Lungs: clear to auscultation bilaterally Heart: regular rate and rhythm, S1, S2 normal, no murmur, click, rub or Santiago Abdomen: soft, non-tender; bowel sounds normal; no masses,  no organomegaly Extremities: extremities normal, atraumatic, no cyanosis or edema Pulses: 2+ and symmetric  EKG normal sinus rhythm at 64 without ST or T wave changes  ASSESSMENT AND PLAN:   CAD History of CAD with extensive stenting of the RCA using DES and not DES stents as well circumflex stenting. His last cardiac catheterization was performed by Nathan Santiago 10/20/which time his stent was placed in the proximal AV groove circumflex. The first obtuse marginal branch appeared to be occluded. He does have 3 overlapping stents in the proximal mid RCA as well as in the distal RCA with 2 overlapping stents. His last Myoview stress test performed by/23/11 was nonischemic. He denies chest pain or shortness of breath.  Peripheral arterial disease His left angiogram was performed in 2010 by Nathan Santiago. He had a 70% distal abdominal aortic stenosis with a 15 mm gradient. He had moderate disease in his SFAs bilaterally with three-vessel runoff. Nathan Santiago to perform carotid angiography at that time revealing an 80-85% proximal right internal carotid artery stenosis. The patient underwent right groin endarterectomy by Dr. Gae Santiago in April 2012. Follow his carotid and lower extremity Dopplers on a routine basis.  HYPERCHOLESTEROLEMIA Well-controlled on statin drug. His most recent lipid profile performed 02/07/20 and a total cholesterol of 128, LDL 60 HDL 42.  HYPERTENSION Well-controlled on current medications      Nathan Harp MD [facility], [facility] 08/06/2013 10:07 AM

## 2013-08-06 NOTE — Assessment & Plan Note (Signed)
His left angiogram was performed in 2010 by [name]. He had a 70% distal abdominal aortic stenosis with a 15 mm gradient. He had moderate disease in his SFAs bilaterally with three-vessel runoff. [name] to perform carotid angiography at that time revealing an 80-85% proximal right internal carotid artery stenosis. The patient underwent right groin endarterectomy by Dr. Gae Gallop in April 2012. Follow his carotid and lower extremity Dopplers on a routine basis.

## 2013-08-06 NOTE — Assessment & Plan Note (Signed)
History of CAD with extensive stenting of the RCA using DES and not DES stents as well circumflex stenting. His last cardiac catheterization was performed by [name] 10/20/which time his stent was placed in the proximal AV groove circumflex. The first obtuse marginal branch appeared to be occluded. He does have 3 overlapping stents in the proximal mid RCA as well as in the distal RCA with 2 overlapping stents. His last Myoview stress test performed by/23/11 was nonischemic. He denies chest pain or shortness of breath.

## 2013-08-06 NOTE — Patient Instructions (Signed)
Your physician wants you to follow-up in: 6 months with an extender and 1 year with Dr Gwenlyn Found. You will receive a reminder letter in the mail two months in advance. If you don't receive a letter, please call our office to schedule the follow-up appointment.  Dr Gwenlyn Found has ordered carotid and lower extremity dopplers to be done in June 2015.

## 2013-08-06 NOTE — Assessment & Plan Note (Signed)
Well-controlled on statin drug. His most recent lipid profile performed 02/07/20 and a total cholesterol of 128, LDL 60 HDL 42.

## 2013-08-19 DIAGNOSIS — L708 Other acne: Secondary | ICD-10-CM | POA: Diagnosis not present

## 2013-08-19 DIAGNOSIS — L570 Actinic keratosis: Secondary | ICD-10-CM | POA: Diagnosis not present

## 2013-09-01 DIAGNOSIS — Z87898 Personal history of other specified conditions: Secondary | ICD-10-CM | POA: Diagnosis not present

## 2013-09-01 DIAGNOSIS — I2510 Atherosclerotic heart disease of native coronary artery without angina pectoris: Secondary | ICD-10-CM | POA: Diagnosis not present

## 2013-09-01 DIAGNOSIS — I10 Essential (primary) hypertension: Secondary | ICD-10-CM | POA: Diagnosis not present

## 2013-09-01 DIAGNOSIS — R7309 Other abnormal glucose: Secondary | ICD-10-CM | POA: Diagnosis not present

## 2013-09-01 DIAGNOSIS — E785 Hyperlipidemia, unspecified: Secondary | ICD-10-CM | POA: Diagnosis not present

## 2013-09-01 DIAGNOSIS — Z125 Encounter for screening for malignant neoplasm of prostate: Secondary | ICD-10-CM | POA: Diagnosis not present

## 2013-09-01 DIAGNOSIS — I739 Peripheral vascular disease, unspecified: Secondary | ICD-10-CM | POA: Diagnosis not present

## 2013-09-01 DIAGNOSIS — K219 Gastro-esophageal reflux disease without esophagitis: Secondary | ICD-10-CM | POA: Diagnosis not present

## 2013-09-01 DIAGNOSIS — N210 Calculus in bladder: Secondary | ICD-10-CM | POA: Diagnosis not present

## 2013-09-08 DIAGNOSIS — Z23 Encounter for immunization: Secondary | ICD-10-CM | POA: Diagnosis not present

## 2013-12-29 ENCOUNTER — Telehealth (HOSPITAL_COMMUNITY): Payer: Self-pay | Admitting: *Deleted

## 2014-01-09 ENCOUNTER — Ambulatory Visit (HOSPITAL_COMMUNITY)
Admission: RE | Admit: 2014-01-09 | Discharge: 2014-01-09 | Disposition: A | Discharge status: Home / Self Care | Payer: Medicare Other | Source: Ambulatory Visit | Attending: Cardiology | Admitting: Cardiology

## 2014-01-09 ENCOUNTER — Ambulatory Visit (HOSPITAL_BASED_OUTPATIENT_CLINIC_OR_DEPARTMENT_OTHER)
Admission: RE | Admit: 2014-01-09 | Discharge: 2014-01-09 | Disposition: A | Discharge status: Home / Self Care | Payer: Medicare Other | Source: Ambulatory Visit | Attending: Cardiology | Admitting: Cardiology

## 2014-01-09 DIAGNOSIS — I739 Peripheral vascular disease, unspecified: Secondary | ICD-10-CM | POA: Diagnosis not present

## 2014-01-09 NOTE — Progress Notes (Signed)
Lower Extremity Arterial Duplex Completed. °Brianna L Mazza,RVT °

## 2014-01-09 NOTE — Progress Notes (Signed)
Carotid Duplex Completed. °Brianna L Mazza,RVT °

## 2014-01-16 ENCOUNTER — Encounter: Payer: Self-pay | Admitting: *Deleted

## 2014-01-20 ENCOUNTER — Telehealth: Payer: Self-pay | Admitting: *Deleted

## 2014-01-20 ENCOUNTER — Telehealth: Payer: Self-pay | Admitting: Cardiovascular Disease

## 2014-01-20 DIAGNOSIS — I739 Peripheral vascular disease, unspecified: Secondary | ICD-10-CM

## 2014-01-20 NOTE — Telephone Encounter (Signed)
Returning your call. °

## 2014-01-20 NOTE — Telephone Encounter (Signed)
Patient notified of doppler results

## 2014-01-20 NOTE — Telephone Encounter (Signed)
Order placed for repeat lower extremity arterial doppler in 6 months  

## 2014-01-20 NOTE — Telephone Encounter (Signed)
Message copied by Chauncy Lean on Tue Jan 20, 2014  1:22 PM ------      Message from: Lorretta Harp      Created: Sun Jan 18, 2014  6:16 PM       Decreased LABI. If pt symptomatic ROV, otherwise repeat 6 months ------

## 2014-01-27 DIAGNOSIS — H52209 Unspecified astigmatism, unspecified eye: Secondary | ICD-10-CM | POA: Diagnosis not present

## 2014-01-27 DIAGNOSIS — Z961 Presence of intraocular lens: Secondary | ICD-10-CM | POA: Diagnosis not present

## 2014-01-27 DIAGNOSIS — H524 Presbyopia: Secondary | ICD-10-CM | POA: Diagnosis not present

## 2014-01-28 ENCOUNTER — Encounter: Payer: Self-pay | Admitting: *Deleted

## 2014-02-04 ENCOUNTER — Encounter: Payer: Self-pay | Admitting: Cardiology

## 2014-02-04 ENCOUNTER — Ambulatory Visit (INDEPENDENT_AMBULATORY_CARE_PROVIDER_SITE_OTHER): Payer: Medicare Other | Admitting: Cardiology

## 2014-02-04 VITALS — BP 108/60 | HR 59 | Ht 64.0 in | Wt 166.9 lb

## 2014-02-04 DIAGNOSIS — I2510 Atherosclerotic heart disease of native coronary artery without angina pectoris: Secondary | ICD-10-CM | POA: Diagnosis not present

## 2014-02-04 DIAGNOSIS — I10 Essential (primary) hypertension: Secondary | ICD-10-CM

## 2014-02-04 DIAGNOSIS — I739 Peripheral vascular disease, unspecified: Secondary | ICD-10-CM | POA: Diagnosis not present

## 2014-02-04 DIAGNOSIS — E7800 Pure hypercholesterolemia, unspecified: Secondary | ICD-10-CM

## 2014-02-04 NOTE — Assessment & Plan Note (Signed)
controled

## 2014-02-04 NOTE — Progress Notes (Signed)
02/04/2014   PCP: [name], MD   Chief Complaint  Patient presents with  . Follow-up    6 months follow-up, pt stated he's been doing well    Primary Cardiologist:Dr. 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 He was formally a patient of [name]. Dr. Adora Fridge assumed his care. His cardiovascular status profile is remarkable for remote tobacco abuse,  hypertension and hyperlipidemia. He is not diabetic. He [place] 5 days a week 25 minutes a day. He has known cardiovascular disease status post stenting of his proximal mid and distal RCA using overlapping stents as well as the proximal AV groove circumflex with a known occluded OM branch which is old. He has normal LV function by 2-D echo performed 02/07/12 a nonischemic Myoview 2010.   Today he is here for routing follow up, he denies chest pain, shortness of breath or any but mild claudication.  His last carotid doppler 01/09/14 was stable and LEA doppler done 01/09/14 revealed drop in ABI on Lt from 0.79 to 0.66.  He has not had any symptoms.   I reviewed last lipids with him drawn by PCP.     Allergies  Allergen Reactions  . Lisinopril Cough  . Niaspan [Niacin Er] Rash    Current Outpatient Prescriptions  Medication Sig Dispense Refill  . amLODipine (NORVASC) 5 MG tablet Take 5 mg by mouth daily.        . Ascorbic Acid (VITAMIN C PO) Take by mouth.        [name] aspirin 81 MG tablet Take 81 mg by mouth daily.        [name] atenolol (TENORMIN) 50 MG tablet Take 50 mg by mouth daily.        [name] atorvastatin (LIPITOR) 40 MG tablet Take 20 mg by mouth daily at 6 PM.       . BIOTIN PO Take by mouth.        [name] CALCIUM CITRATE PO Take by mouth.        . Cholecalciferol (VITAMIN D-3 PO) Take 1,000 Units by mouth daily.       . cilostazol (PLETAL) 100 MG tablet Take 100 mg by mouth 2  (two) times daily.        . clopidogrel (PLAVIX) 75 MG tablet Take 75 mg by mouth daily.        . diazepam (VALIUM) 5 MG tablet Take 5 mg by mouth every 8 (eight) hours as needed for anxiety.      . Evening Primrose topical oil Apply topically as needed.        . fish oil-omega-3 fatty acids 1000 MG capsule Take 2 g by mouth daily.        [name] GLUCOSAMINE PO Take 1,500 mg by mouth daily.       . hydrochlorothiazide 25 MG tablet Take 25 mg by mouth daily.        [name] losartan (COZAAR) 50 MG tablet Take 50 mg by mouth daily.      . metoCLOPramide (REGLAN) 5 MG tablet Take 5 mg by mouth daily.        . Multiple Vitamin (MULTIVITAMIN PO) Take by mouth.        [name] omeprazole (PRILOSEC) 40 MG capsule Take 20 mg by mouth 2 (two) times daily.       [name]  potassium chloride SA (K-DUR,KLOR-CON) 20 MEQ tablet Take 20 mEq by mouth once.       . terazosin (HYTRIN) 5 MG capsule Take 5 mg by mouth at bedtime.       No current facility-administered medications for this visit.    Past Medical History  Diagnosis Date  . Hypertension   . Hyperlipidemia   . GERD (gastroesophageal reflux disease)     w/ LPR  . CAD (coronary artery disease)   . Gastroparesis   . Adenomatous colon polyp 11/2003  . PUD (peptic ulcer disease)   . Peripheral arterial disease     RCEA  by Dr Lemar Livings  . AAA (abdominal aortic aneurysm)     asymptomatic  . AAA (abdominal aortic aneurysm) 2010    peripheral  angiogram-- bilateral SFA DISEASE  and 60 to 70% infrarenaal abd. aortic stenosis with 15 -mm gradient    Past Surgical History  Procedure Laterality Date  . Cataract extraction    . Tonsillectomy    . Lower extremity doppler  June 18 ,2013    ABI'S ABNORMAL, RABI was 0.88 and LABI 0.75 ,with 3-vessel  run off  . Nm myoview ltd  MAY 23,2011    showed no significant ischemia;  . Doppler echocardiography  02/07/2012    EF 55%,SHOWED NO ISCHEMIA   . Nm myoview ltd  04/22/2008    ef 77%,exercise capcity 6 METS ,exaggerated  blood pressure response to exercise  . Retrograde central aortic catheterization  05/19/2005    cutting balloon atherectomy, c-circ stenosis with DES STENTING CYPHER  . Cardiac catheterization  05/12/2005    RCA  . Coronary angioplasty  05/18/2005    2 STENTS distal RCA AND PROXIMAL-MID RCA  . Lower arterial  doppler  02/04/2013    aotra 1.5 x 1.5 cm; distal abd aorta 70-99%,proximal common iliac arteries -very stenotic with increased velocities>50%,may be falsely elevated as a result of residual plaque from the distal aorta stenosis  . Carotid doppler  01/24/2013    RICA endarterectomy,left CCA 0-49%; left bulb and prox ICA 50-69%; bilaateral subclavian < 50%  . Carotid endarterectomy  11/01/2010    LZJ:QBHALPF:XT colds or fevers, no weight changes Skin:no rashes or ulcers HEENT:no blurred vision, no congestion CV:see HPI PUL:see HPI GI:no diarrhea constipation or melena, no indigestion GU:no hematuria, no dysuria MS:no joint pain, minimal claudication, no significant changes Neuro:no syncope, no lightheadedness Endo:no diabetes, no thyroid disease  Wt Readings from Last 3 Encounters:  02/04/14 166 lb 14.4 oz (75.705 kg)  08/06/13 168 lb (76.204 kg)  10/02/12 166 lb (75.297 kg)    PHYSICAL EXAM BP 108/60  Pulse 59  Ht 5\' 4"  (1.626 m)  Wt 166 lb 14.4 oz (75.705 kg)  BMI 28.63 kg/m2 General:Pleasant affect, NAD Skin:Warm and dry, brisk capillary refill HEENT:normocephalic, sclera clear, mucus membranes moist Neck:supple, no JVD,+ bil. carotid bruits, no adenopathy  Heart:S1S2 RRR without murmur, gallup, rub or click Lungs:clear without rales, rhonchi, or wheezes KWI:OXBD, non tender, + BS, do not palpate liver spleen or masses Ext:no lower ext edema, 1+ post tib pulses, 2+ radial pulses Neuro:alert and oriented, MAE, follows commands, + facial symmetry  EKG:SB rate of 59 no changes from 08/06/13  ASSESSMENT AND PLAN CAD History of CAD with extensive stenting of the RCA  using DES and not DES stents as well circumflex stenting. His last cardiac catheterization was performed by [name] 05/19/08 which time his stent was placed in the proximal AV groove circumflex. The  first obtuse marginal branch appeared to be occluded. He does have 3 overlapping stents in the proximal mid RCA as well as in the distal RCA with 2 overlapping stents. His last Myoview stress test performed by 2010 was nonischemic. He denies chest pain or shortness of breath. Last echo 01/2012 with normal LV function.     Peripheral arterial disease His last angiogram was performed in 2010 by [name]. He had a 70% distal abdominal aortic stenosis with a 15 mm gradient. He had moderate disease in his SFAs bilaterally with three-vessel runoff. [name] to perform carotid angiography at that time revealing an 80-85% proximal right internal carotid artery stenosis. The patient underwent right groin endarterectomy by Dr. Gae Gallop in April 2012. We follow his carotid and lower extremity Dopplers on a routine basis- last done in Feb 2015 with stable carotids and LEA dopplers Rt ABI 0.88 and Lt ABI 0.66 but no increased symptoms.  Follow up with Dr. Gwenlyn Found in 6 months.   HYPERCHOLESTEROLEMIA Followed by PCP, done June 2015 LDL 68, TG 92, HDL 42, T chol 128 continue lipitor.  HYPERTENSION controled

## 2014-02-04 NOTE — Patient Instructions (Signed)
Your physician recommends that you schedule a follow-up appointment in:6 months with Dr Berry 

## 2014-02-04 NOTE — Assessment & Plan Note (Signed)
Followed by PCP, done June 2015 LDL 68, TG 92, HDL 42, T chol 128 continue lipitor.

## 2014-02-04 NOTE — Assessment & Plan Note (Addendum)
History of CAD with extensive stenting of the RCA using DES and not DES stents as well circumflex stenting. His last cardiac catheterization was performed by [name] 05/19/08 which time his stent was placed in the proximal AV groove circumflex. The first obtuse marginal branch appeared to be occluded. He does have 3 overlapping stents in the proximal mid RCA as well as in the distal RCA with 2 overlapping stents. His last Myoview stress test performed by 2010 was nonischemic. He denies chest pain or shortness of breath. Last echo 01/2012 with normal LV function.

## 2014-02-04 NOTE — Assessment & Plan Note (Signed)
His last angiogram was performed in 2010 by [name]. He had a 70% distal abdominal aortic stenosis with a 15 mm gradient. He had moderate disease in his SFAs bilaterally with three-vessel runoff. [name] to perform carotid angiography at that time revealing an 80-85% proximal right internal carotid artery stenosis. The patient underwent right groin endarterectomy by Dr. Gae Gallop in April 2012. We follow his carotid and lower extremity Dopplers on a routine basis- last done in Feb 2015 with stable carotids and LEA dopplers Rt ABI 0.88 and Lt ABI 0.66 but no increased symptoms.  Follow up with Dr. Gwenlyn Found in 6 months.

## 2014-03-02 DIAGNOSIS — R269 Unspecified abnormalities of gait and mobility: Secondary | ICD-10-CM | POA: Diagnosis not present

## 2014-03-02 DIAGNOSIS — I714 Abdominal aortic aneurysm, without rupture, unspecified: Secondary | ICD-10-CM | POA: Diagnosis not present

## 2014-03-02 DIAGNOSIS — R7309 Other abnormal glucose: Secondary | ICD-10-CM | POA: Diagnosis not present

## 2014-03-02 DIAGNOSIS — I739 Peripheral vascular disease, unspecified: Secondary | ICD-10-CM | POA: Diagnosis not present

## 2014-03-02 DIAGNOSIS — I2510 Atherosclerotic heart disease of native coronary artery without angina pectoris: Secondary | ICD-10-CM | POA: Diagnosis not present

## 2014-03-02 DIAGNOSIS — E785 Hyperlipidemia, unspecified: Secondary | ICD-10-CM | POA: Diagnosis not present

## 2014-03-02 DIAGNOSIS — Z1331 Encounter for screening for depression: Secondary | ICD-10-CM | POA: Diagnosis not present

## 2014-03-02 DIAGNOSIS — I10 Essential (primary) hypertension: Secondary | ICD-10-CM | POA: Diagnosis not present

## 2014-03-02 DIAGNOSIS — I6529 Occlusion and stenosis of unspecified carotid artery: Secondary | ICD-10-CM | POA: Diagnosis not present

## 2014-04-16 DIAGNOSIS — M7750 Other enthesopathy of unspecified foot: Secondary | ICD-10-CM | POA: Diagnosis not present

## 2014-04-22 DIAGNOSIS — Z23 Encounter for immunization: Secondary | ICD-10-CM | POA: Diagnosis not present

## 2014-07-21 ENCOUNTER — Encounter: Payer: Self-pay | Admitting: Cardiovascular Disease

## 2014-07-31 DIAGNOSIS — R05 Cough: Secondary | ICD-10-CM | POA: Diagnosis not present

## 2014-07-31 DIAGNOSIS — J018 Other acute sinusitis: Secondary | ICD-10-CM | POA: Diagnosis not present

## 2014-08-04 DIAGNOSIS — R339 Retention of urine, unspecified: Secondary | ICD-10-CM | POA: Diagnosis not present

## 2014-08-04 DIAGNOSIS — R3915 Urgency of urination: Secondary | ICD-10-CM | POA: Diagnosis not present

## 2014-08-04 DIAGNOSIS — N3941 Urge incontinence: Secondary | ICD-10-CM | POA: Diagnosis not present

## 2014-08-07 ENCOUNTER — Ambulatory Visit (INDEPENDENT_AMBULATORY_CARE_PROVIDER_SITE_OTHER): Payer: Medicare Other | Admitting: Cardiovascular Disease

## 2014-08-07 ENCOUNTER — Encounter: Payer: Self-pay | Admitting: Cardiovascular Disease

## 2014-08-07 VITALS — BP 128/78 | HR 64 | Ht 65.0 in | Wt 169.9 lb

## 2014-08-07 DIAGNOSIS — E7800 Pure hypercholesterolemia, unspecified: Secondary | ICD-10-CM

## 2014-08-07 DIAGNOSIS — I10 Essential (primary) hypertension: Secondary | ICD-10-CM

## 2014-08-07 DIAGNOSIS — I779 Disorder of arteries and arterioles, unspecified: Secondary | ICD-10-CM | POA: Diagnosis not present

## 2014-08-07 DIAGNOSIS — I2510 Atherosclerotic heart disease of native coronary artery without angina pectoris: Secondary | ICD-10-CM | POA: Diagnosis not present

## 2014-08-07 DIAGNOSIS — I739 Peripheral vascular disease, unspecified: Secondary | ICD-10-CM | POA: Diagnosis not present

## 2014-08-07 NOTE — Patient Instructions (Signed)
Dr Berry wants you to follow-up in 1 year . You will receive a reminder letter in the mail two months in advance. If you don't receive a letter, please call our office to schedule the follow-up appointment. 

## 2014-08-07 NOTE — Progress Notes (Signed)
08/07/2014 Nathan Santiago   [birth year]  [id number]  Primary Physician [name], MD Primary Cardiologist: [name] [name]   HPI:  Mr. 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 He was formally a patient of [name]. I assumedhis care. I last saw him in the office 08/06/13. His cardiovascular status profile is remarkable for remote tobacco abuse, 2 hypertension and hyperlipidemia. He is not diabetic. He does exercise [place] a [place] where he walks 5 days a week 25 minutes a day. He hasn't known cardiovascular disease status post stenting of his proximal mid and distal RCA using overlapping stents as well as the proximal AV groove circumflex with a known occluded OM branch which is old. He has normal LV function by 2-D echo performed 02/07/12 a nonischemic Myoview 2010. He denies chest pain, shortness of breath or claudication. He had carotid Dopplers performed 01/09/14 that showed a widely patent right carotid endarterectomy site with moderate left ICA stenosis remained stable. Lower extremity arterial Dopplers likewise had not changed with a high-frequency signal in his distal abdominal aorta corresponding to 70% stenosis documented on angiography performed [name] 11/03/08.   Current Outpatient Prescriptions  Medication Sig Dispense Refill  . amLODipine (NORVASC) 5 MG tablet Take 5 mg by mouth daily.      . Ascorbic Acid (VITAMIN C PO) Take by mouth.      [name] aspirin 81 MG tablet Take 81 mg by mouth daily.      [name] atenolol (TENORMIN) 50 MG tablet Take 50 mg by mouth daily.      [name] atorvastatin (LIPITOR) 40 MG tablet Take 20 mg by mouth daily at 6 PM.     . BIOTIN PO Take by mouth.      [name] CALCIUM CITRATE PO Take by mouth.      . Cholecalciferol (VITAMIN D-3 PO) Take 1,000 Units by mouth daily.     . cilostazol (PLETAL) 100 MG tablet Take 100 mg by  mouth 2 (two) times daily.      . clopidogrel (PLAVIX) 75 MG tablet Take 75 mg by mouth daily.      . diazepam (VALIUM) 5 MG tablet Take 5 mg by mouth every 8 (eight) hours as needed for anxiety.    . Evening Primrose topical oil Apply topically as needed.      . fish oil-omega-3 fatty acids 1000 MG capsule Take 2 g by mouth daily.      [name] GLUCOSAMINE PO Take 1,500 mg by mouth daily.     . hydrochlorothiazide 25 MG tablet Take 25 mg by mouth daily.      [name] losartan (COZAAR) 50 MG tablet Take 50 mg by mouth daily.    . metoCLOPramide (REGLAN) 5 MG tablet Take 5 mg by mouth daily.      . Multiple Vitamin (MULTIVITAMIN PO) Take by mouth.      [name] omeprazole (PRILOSEC) 40 MG capsule Take 20 mg by mouth 2 (two) times daily.     . potassium chloride SA (K-DUR,KLOR-CON) 20 MEQ tablet Take 20 mEq by mouth once.     . terazosin (HYTRIN) 5 MG capsule Take 5 mg by mouth at bedtime.    . benzonatate (TESSALON) 100 MG capsule   0  . tamsulosin (FLOMAX) 0.4 MG CAPS capsule Take by mouth once.     No current facility-administered medications for this visit.  Allergies  Allergen Reactions  . Lisinopril Cough  . Niaspan [Niacin Er] Rash    History   Social History  . Marital Status: Married    Spouse Name: N/A    Number of Children: N/A  . Years of Education: N/A   Occupational History  . Not on file.   Social History Main Topics  . Smoking status: Former Smoker    Quit date: 07/31/1968  . Smokeless tobacco: Never Used  . Alcohol Use: 2.4 oz/week    4 Glasses of wine per week  . Drug Use: Not on file  . Sexual Activity: Not on file   Other Topics Concern  . Not on file   Social History Narrative     Review of Systems: General: negative for chills, fever, night sweats or weight changes.  Cardiovascular: negative for chest pain, dyspnea on exertion, edema, orthopnea, palpitations, paroxysmal nocturnal dyspnea or shortness of breath Dermatological: negative for rash Respiratory:  negative for cough or wheezing Urologic: negative for hematuria Abdominal: negative for nausea, vomiting, diarrhea, bright red blood per rectum, melena, or hematemesis Neurologic: negative for visual changes, syncope, or dizziness All other systems reviewed and are otherwise negative except as noted above.    Blood pressure 128/78, pulse 64, height 5\' 5"  (1.651 m), weight 169 lb 14.4 oz (77.066 kg).  General appearance: alert and no distress Neck: no adenopathy, no carotid bruit, no JVD, supple, symmetrical, trachea midline and thyroid not enlarged, symmetric, no tenderness/mass/nodules Lungs: clear to auscultation bilaterally Heart: regular rate and rhythm, S1, S2 normal, no murmur, click, rub or gallop Extremities: extremities normal, atraumatic, no cyanosis or edema  EKG normal sinus rhythm at 64 without ST or T-wave changes. I personally reviewed this EKG  ASSESSMENT AND PLAN:   Peripheral arterial disease Mr. Lomeli had peripheral angiography performed by [name] 11/03/08 revealing a 70% distal abdominal aortic stenosis and moderate bilateral SFA disease. He denies claudication. Lower extremity arterial Doppler studies performed 01/09/14 revealed high-grade distal abdominal aortic stenosis which had not changed with a right ABI 0.88 and a left of 0.66.  Essential hypertension History of hypertension with blood pressure measured at 128/78. He is on amlodipine, atenolol, hydrochlorothiazide and losartan. Continue current meds at current dosing  HYPERCHOLESTEROLEMIA History of hyperlipidemia on atorvastatin 40 mg a day followed by his PCP  Coronary atherosclerosis History of CAD status post stenting of his proximal mid and distal RCA using overlapping stents as well as his AV groove circumflex with a known occluded OM branch 05/18/05 by [name].his last Myoview performed 12/20/09 was nonischemic. He denies chest pain or shortness of breath.  Bilateral carotid artery  disease History of coronary artery disease status post right carotid endarterectomy performed by Dr. Scot Dock 11/01/10. His most recent carotid Dopplers performed 01/09/14 revealed a widely patent endarterectomy site with moderate left internal carotid artery stenosis that had remained stable.      [name] FACP,FACC,FAHA, [facility] 08/07/2014 10:36 AM

## 2014-08-07 NOTE — Assessment & Plan Note (Signed)
History of coronary artery disease status post right carotid endarterectomy performed by Dr. Scot Dock 11/01/10. His most recent carotid Dopplers performed 01/09/14 revealed a widely patent endarterectomy site with moderate left internal carotid artery stenosis that had remained stable.

## 2014-08-07 NOTE — Assessment & Plan Note (Signed)
History of hyperlipidemia on atorvastatin 40 mg a day followed by his PCP 

## 2014-08-07 NOTE — Assessment & Plan Note (Signed)
History of CAD status post stenting of his proximal mid and distal RCA using overlapping stents as well as his AV groove circumflex with a known occluded OM branch 05/18/05 by [name].his last Myoview performed 12/20/09 was nonischemic. He denies chest pain or shortness of breath.

## 2014-08-07 NOTE — Assessment & Plan Note (Signed)
Nathan Santiago had peripheral angiography performed by [name] 11/03/08 revealing a 70% distal abdominal aortic stenosis and moderate bilateral SFA disease. He denies claudication. Lower extremity arterial Doppler studies performed 01/09/14 revealed high-grade distal abdominal aortic stenosis which had not changed with a right ABI 0.88 and a left of 0.66.

## 2014-08-07 NOTE — Assessment & Plan Note (Signed)
History of hypertension with blood pressure measured at 128/78. He is on amlodipine, atenolol, hydrochlorothiazide and losartan. Continue current meds at current dosing

## 2014-09-02 DIAGNOSIS — I739 Peripheral vascular disease, unspecified: Secondary | ICD-10-CM | POA: Diagnosis not present

## 2014-09-02 DIAGNOSIS — E785 Hyperlipidemia, unspecified: Secondary | ICD-10-CM | POA: Diagnosis not present

## 2014-09-02 DIAGNOSIS — I10 Essential (primary) hypertension: Secondary | ICD-10-CM | POA: Diagnosis not present

## 2014-09-02 DIAGNOSIS — I6529 Occlusion and stenosis of unspecified carotid artery: Secondary | ICD-10-CM | POA: Diagnosis not present

## 2014-09-02 DIAGNOSIS — I714 Abdominal aortic aneurysm, without rupture: Secondary | ICD-10-CM | POA: Diagnosis not present

## 2014-09-02 DIAGNOSIS — D126 Benign neoplasm of colon, unspecified: Secondary | ICD-10-CM | POA: Diagnosis not present

## 2014-09-02 DIAGNOSIS — Z6828 Body mass index (BMI) 28.0-28.9, adult: Secondary | ICD-10-CM | POA: Diagnosis not present

## 2014-09-02 DIAGNOSIS — I2510 Atherosclerotic heart disease of native coronary artery without angina pectoris: Secondary | ICD-10-CM | POA: Diagnosis not present

## 2014-09-07 DIAGNOSIS — R3915 Urgency of urination: Secondary | ICD-10-CM | POA: Diagnosis not present

## 2014-09-07 DIAGNOSIS — R339 Retention of urine, unspecified: Secondary | ICD-10-CM | POA: Diagnosis not present

## 2014-09-07 DIAGNOSIS — N401 Enlarged prostate with lower urinary tract symptoms: Secondary | ICD-10-CM | POA: Diagnosis not present

## 2014-10-05 DIAGNOSIS — N138 Other obstructive and reflux uropathy: Secondary | ICD-10-CM | POA: Diagnosis not present

## 2014-10-05 DIAGNOSIS — N401 Enlarged prostate with lower urinary tract symptoms: Secondary | ICD-10-CM | POA: Diagnosis not present

## 2014-10-05 DIAGNOSIS — R339 Retention of urine, unspecified: Secondary | ICD-10-CM | POA: Diagnosis not present

## 2015-01-08 ENCOUNTER — Encounter: Payer: Self-pay | Admitting: Gastroenterology

## 2015-01-15 ENCOUNTER — Encounter (HOSPITAL_COMMUNITY): Payer: Self-pay | Admitting: *Deleted

## 2015-01-15 ENCOUNTER — Other Ambulatory Visit: Payer: Self-pay | Admitting: Cardiovascular Disease

## 2015-01-15 DIAGNOSIS — I739 Peripheral vascular disease, unspecified: Secondary | ICD-10-CM

## 2015-02-09 DIAGNOSIS — Z961 Presence of intraocular lens: Secondary | ICD-10-CM | POA: Diagnosis not present

## 2015-02-09 DIAGNOSIS — H52203 Unspecified astigmatism, bilateral: Secondary | ICD-10-CM | POA: Diagnosis not present

## 2015-02-10 ENCOUNTER — Other Ambulatory Visit: Payer: Self-pay | Admitting: Cardiovascular Disease

## 2015-02-10 DIAGNOSIS — I6523 Occlusion and stenosis of bilateral carotid arteries: Secondary | ICD-10-CM

## 2015-02-15 ENCOUNTER — Ambulatory Visit (HOSPITAL_COMMUNITY)
Admission: RE | Admit: 2015-02-15 | Discharge: 2015-02-15 | Disposition: A | Discharge status: Home / Self Care | Payer: Medicare Other | Source: Ambulatory Visit | Attending: Cardiovascular Disease | Admitting: Cardiovascular Disease

## 2015-02-15 ENCOUNTER — Ambulatory Visit (HOSPITAL_BASED_OUTPATIENT_CLINIC_OR_DEPARTMENT_OTHER)
Admission: RE | Admit: 2015-02-15 | Discharge: 2015-02-15 | Disposition: A | Discharge status: Home / Self Care | Payer: Medicare Other | Source: Ambulatory Visit | Attending: Cardiology | Admitting: Cardiology

## 2015-02-15 DIAGNOSIS — I6523 Occlusion and stenosis of bilateral carotid arteries: Secondary | ICD-10-CM | POA: Diagnosis not present

## 2015-02-15 DIAGNOSIS — I739 Peripheral vascular disease, unspecified: Secondary | ICD-10-CM | POA: Diagnosis not present

## 2015-02-15 DIAGNOSIS — I700 Atherosclerosis of aorta: Secondary | ICD-10-CM | POA: Insufficient documentation

## 2015-02-15 NOTE — Progress Notes (Signed)
LE ART SEG MULT LEVEL Completed. Stable ABIs when compared to prior study.  Oda Cogan, BS, RDMS, RVT

## 2015-03-10 DIAGNOSIS — E785 Hyperlipidemia, unspecified: Secondary | ICD-10-CM | POA: Diagnosis not present

## 2015-03-10 DIAGNOSIS — I2510 Atherosclerotic heart disease of native coronary artery without angina pectoris: Secondary | ICD-10-CM | POA: Diagnosis not present

## 2015-03-10 DIAGNOSIS — R269 Unspecified abnormalities of gait and mobility: Secondary | ICD-10-CM | POA: Diagnosis not present

## 2015-03-10 DIAGNOSIS — Z1389 Encounter for screening for other disorder: Secondary | ICD-10-CM | POA: Diagnosis not present

## 2015-03-10 DIAGNOSIS — Z6829 Body mass index (BMI) 29.0-29.9, adult: Secondary | ICD-10-CM | POA: Diagnosis not present

## 2015-03-10 DIAGNOSIS — I739 Peripheral vascular disease, unspecified: Secondary | ICD-10-CM | POA: Diagnosis not present

## 2015-03-10 DIAGNOSIS — I10 Essential (primary) hypertension: Secondary | ICD-10-CM | POA: Diagnosis not present

## 2015-03-10 DIAGNOSIS — K635 Polyp of colon: Secondary | ICD-10-CM | POA: Diagnosis not present

## 2015-03-10 DIAGNOSIS — Z0000 Encounter for general adult medical examination without abnormal findings: Secondary | ICD-10-CM | POA: Diagnosis not present

## 2015-03-10 DIAGNOSIS — R7309 Other abnormal glucose: Secondary | ICD-10-CM | POA: Diagnosis not present

## 2015-05-20 DIAGNOSIS — Z23 Encounter for immunization: Secondary | ICD-10-CM | POA: Diagnosis not present

## 2015-08-04 ENCOUNTER — Ambulatory Visit (INDEPENDENT_AMBULATORY_CARE_PROVIDER_SITE_OTHER): Payer: Medicare Other | Admitting: Cardiovascular Disease

## 2015-08-04 ENCOUNTER — Encounter: Payer: Self-pay | Admitting: Cardiovascular Disease

## 2015-08-04 VITALS — BP 126/60 | HR 75 | Ht 64.0 in | Wt 174.0 lb

## 2015-08-04 DIAGNOSIS — I2510 Atherosclerotic heart disease of native coronary artery without angina pectoris: Secondary | ICD-10-CM

## 2015-08-04 DIAGNOSIS — I739 Peripheral vascular disease, unspecified: Secondary | ICD-10-CM | POA: Diagnosis not present

## 2015-08-04 DIAGNOSIS — I10 Essential (primary) hypertension: Secondary | ICD-10-CM | POA: Diagnosis not present

## 2015-08-04 DIAGNOSIS — E7800 Pure hypercholesterolemia, unspecified: Secondary | ICD-10-CM

## 2015-08-04 NOTE — Assessment & Plan Note (Signed)
History of peripheral arterial disease with a known 70% distal abdominal aortic stenosis demonstrated angiographically by [name] 11/03/08. He denies claudication. Recent Dopplers performed 02/15/15 revealed stable ABIs approximate 0.9 bilaterally. He is also had a history of remote right carotid endarterectomy with Dopplers performed 02/15/15 revealing a widely patent endarterectomy site with moderate left ICA stenosis that has remained stable. He is neurologically asymptomatic.

## 2015-08-04 NOTE — Assessment & Plan Note (Signed)
History of coronary artery disease status post proximal and mid and distal RCA stenting using overlapping stents as well as proximal AV groove circumflex with a known occluded OM branch. His normal LV function by 2-D echo performed 02/07/12 with a nonischemic Myoview performed in 2010. He denies chest pain or shortness of breath.

## 2015-08-04 NOTE — Assessment & Plan Note (Signed)
History of hyperlipidemia on statin therapy followed by his PCP 

## 2015-08-04 NOTE — Patient Instructions (Signed)
Medication Instructions:  Your physician recommends that you continue on your current medications as directed. Please refer to the Current Medication list given to you today.   Labwork: I will get your lab work from your Primary Care Physician.   Testing/Procedures: none  Follow-Up: We request that you follow-up in: 6 months with Kerin Ransom, PA and in 12 months with Dr Andria Rhein will receive a reminder letter in the mail two months in advance. If you don't receive a letter, please call our office to schedule the follow-up appointment.    Any Other Special Instructions Will Be Listed Below (If Applicable).     If you need a refill on your cardiac medications before your next appointment, please call your pharmacy.

## 2015-08-04 NOTE — Progress Notes (Signed)
08/04/2015 Nathan Santiago   [birth year]  [id number]  Primary Physician [name], MD Primary Cardiologist: [name] [name]   HPI:  Mr. 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 He was formally a patient of [name].  I last saw him in the office 08/07/14.[name] His cardiovascular status profile is remarkable for remote tobacco abuse, 2 hypertension and hyperlipidemia. He is not diabetic. [place] 5 days a week 25 minutes a day. He hasn't known cardiovascular disease status post stenting of his proximal mid and distal RCA using overlapping stents as well as the proximal AV groove circumflex with a known occluded OM branch which is old. He has normal LV function by 2-D echo performed 02/07/12 a nonischemic Myoview 2010. He denies chest pain, shortness of breath or claudication. He had carotid Dopplers performed 01/09/14 that showed a widely patent right carotid endarterectomy site with moderate left ICA stenosis remained stable. Lower extremity arterial Dopplers likewise had not changed with a high-frequency signal in his distal abdominal aorta corresponding to 70% stenosis documented on angiography performed [name] 11/03/08.   Current Outpatient Prescriptions  Medication Sig Dispense Refill  . amLODipine (NORVASC) 5 MG tablet Take 5 mg by mouth daily.      . Ascorbic Acid (VITAMIN C PO) Take by mouth.      [name] aspirin 81 MG tablet Take 81 mg by mouth daily.      [name] atenolol (TENORMIN) 50 MG tablet Take 50 mg by mouth daily.      [name] atorvastatin (LIPITOR) 40 MG tablet Take 20 mg by mouth daily at 6 PM.     . benzonatate (TESSALON) 100 MG capsule   0  . BIOTIN PO Take by mouth.      [name] CALCIUM CITRATE PO Take by mouth.      . Cholecalciferol (VITAMIN D-3 PO) Take 1,000 Units by mouth daily.     . cilostazol (PLETAL) 100  MG tablet Take 100 mg by mouth 2 (two) times daily.      . clopidogrel (PLAVIX) 75 MG tablet Take 75 mg by mouth daily.      . diazepam (VALIUM) 5 MG tablet Take 5 mg by mouth every 8 (eight) hours as needed for anxiety.    . Evening Primrose topical oil Apply topically as needed.      . fish oil-omega-3 fatty acids 1000 MG capsule Take 2 g by mouth daily.      [name] GLUCOSAMINE PO Take 1,500 mg by mouth daily.     . hydrochlorothiazide 25 MG tablet Take 25 mg by mouth daily.      [name] losartan (COZAAR) 50 MG tablet Take 50 mg by mouth daily.    . metoCLOPramide (REGLAN) 5 MG tablet Take 5 mg by mouth daily.      . Multiple Vitamin (MULTIVITAMIN PO) Take by mouth.      [name] omeprazole (PRILOSEC) 40 MG capsule Take 20 mg by mouth 2 (two) times daily.     . potassium chloride SA (K-DUR,KLOR-CON) 20 MEQ tablet Take 20 mEq by mouth once.     . tamsulosin (FLOMAX) 0.4 MG CAPS capsule Take by mouth once.     No current facility-administered medications for this visit.    Allergies  Allergen Reactions  . Lisinopril Cough  . Niaspan [Niacin Er] Rash  Social History   Social History  . Marital Status: Married    Spouse Name: N/A  . Number of Children: N/A  . Years of Education: N/A   Occupational History  . Not on file.   Social History Main Topics  . Smoking status: Former Smoker    Quit date: 07/31/1968  . Smokeless tobacco: Never Used  . Alcohol Use: 2.4 oz/week    4 Glasses of wine per week  . Drug Use: Not on file  . Sexual Activity: Not on file   Other Topics Concern  . Not on file   Social History Narrative     Review of Systems: General: negative for chills, fever, night sweats or weight changes.  Cardiovascular: negative for chest pain, dyspnea on exertion, edema, orthopnea, palpitations, paroxysmal nocturnal dyspnea or shortness of breath Dermatological: negative for rash Respiratory: negative for cough or wheezing Urologic: negative for hematuria Abdominal: negative  for nausea, vomiting, diarrhea, bright red blood per rectum, melena, or hematemesis Neurologic: negative for visual changes, syncope, or dizziness All other systems reviewed and are otherwise negative except as noted above.    Blood pressure 126/60, pulse 75, height 5\' 4"  (1.626 m), weight 174 lb (78.926 kg).  General appearance: alert and no distress Neck: no adenopathy, no JVD, supple, symmetrical, trachea midline, thyroid not enlarged, symmetric, no tenderness/mass/nodules and soft left carotid bruit Lungs: clear to auscultation bilaterally Heart: regular rate and rhythm, S1, S2 normal, no murmur, click, rub or gallop Extremities: extremities normal, atraumatic, no cyanosis or edema  EKG normal sinus rhythm of 75 without ST or T-wave changes. I personally reviewed this EKG  ASSESSMENT AND PLAN:   Peripheral arterial disease History of peripheral arterial disease with a known 70% distal abdominal aortic stenosis demonstrated angiographically by [name] 11/03/08. He denies claudication. Recent Dopplers performed 02/15/15 revealed stable ABIs approximate 0.9 bilaterally. He is also had a history of remote right carotid endarterectomy with Dopplers performed 02/15/15 revealing a widely patent endarterectomy site with moderate left ICA stenosis that has remained stable. He is neurologically asymptomatic.  HYPERCHOLESTEROLEMIA History of hyperlipidemia on statin therapy followed by his PCP  Essential hypertension History of hypertension blood pressure measures 126/60. He is on atenolol and amlodipine. He had been on losartan in the past which was discontinued. Continue current meds at current dosing  Coronary atherosclerosis History of coronary artery disease status post proximal and mid and distal RCA stenting using overlapping stents as well as proximal AV groove circumflex with a known occluded OM branch. His normal LV function by 2-D echo performed 02/07/12 with a nonischemic Myoview  performed in 2010. He denies chest pain or shortness of breath.      [name] FACP,FACC,FAHA, [facility] 08/04/2015 9:35 AM

## 2015-08-04 NOTE — Assessment & Plan Note (Signed)
History of hypertension blood pressure measures 126/60. He is on atenolol and amlodipine. He had been on losartan in the past which was discontinued. Continue current meds at current dosing

## 2015-09-13 DIAGNOSIS — I6529 Occlusion and stenosis of unspecified carotid artery: Secondary | ICD-10-CM | POA: Diagnosis not present

## 2015-09-13 DIAGNOSIS — K635 Polyp of colon: Secondary | ICD-10-CM | POA: Diagnosis not present

## 2015-09-13 DIAGNOSIS — E784 Other hyperlipidemia: Secondary | ICD-10-CM | POA: Diagnosis not present

## 2015-09-13 DIAGNOSIS — R2689 Other abnormalities of gait and mobility: Secondary | ICD-10-CM | POA: Diagnosis not present

## 2015-09-13 DIAGNOSIS — I10 Essential (primary) hypertension: Secondary | ICD-10-CM | POA: Diagnosis not present

## 2015-09-13 DIAGNOSIS — I2510 Atherosclerotic heart disease of native coronary artery without angina pectoris: Secondary | ICD-10-CM | POA: Diagnosis not present

## 2015-09-13 DIAGNOSIS — I7389 Other specified peripheral vascular diseases: Secondary | ICD-10-CM | POA: Diagnosis not present

## 2015-09-13 DIAGNOSIS — Z1389 Encounter for screening for other disorder: Secondary | ICD-10-CM | POA: Diagnosis not present

## 2015-09-13 DIAGNOSIS — R7309 Other abnormal glucose: Secondary | ICD-10-CM | POA: Diagnosis not present

## 2015-09-13 DIAGNOSIS — N200 Calculus of kidney: Secondary | ICD-10-CM | POA: Diagnosis not present

## 2015-09-13 DIAGNOSIS — Z6829 Body mass index (BMI) 29.0-29.9, adult: Secondary | ICD-10-CM | POA: Diagnosis not present

## 2015-09-16 DIAGNOSIS — Z0000 Encounter for general adult medical examination without abnormal findings: Secondary | ICD-10-CM | POA: Diagnosis not present

## 2015-09-16 DIAGNOSIS — N23 Unspecified renal colic: Secondary | ICD-10-CM | POA: Diagnosis not present

## 2015-09-16 DIAGNOSIS — N202 Calculus of kidney with calculus of ureter: Secondary | ICD-10-CM | POA: Diagnosis not present

## 2015-10-12 DIAGNOSIS — Z0000 Encounter for general adult medical examination without abnormal findings: Secondary | ICD-10-CM | POA: Diagnosis not present

## 2015-10-12 DIAGNOSIS — Z87442 Personal history of urinary calculi: Secondary | ICD-10-CM | POA: Diagnosis not present

## 2015-10-12 DIAGNOSIS — N401 Enlarged prostate with lower urinary tract symptoms: Secondary | ICD-10-CM | POA: Diagnosis not present

## 2015-10-12 DIAGNOSIS — N138 Other obstructive and reflux uropathy: Secondary | ICD-10-CM | POA: Diagnosis not present

## 2015-10-13 DIAGNOSIS — M545 Low back pain: Secondary | ICD-10-CM | POA: Diagnosis not present

## 2015-11-11 DIAGNOSIS — R42 Dizziness and giddiness: Secondary | ICD-10-CM | POA: Diagnosis not present

## 2015-11-11 DIAGNOSIS — Z6829 Body mass index (BMI) 29.0-29.9, adult: Secondary | ICD-10-CM | POA: Diagnosis not present

## 2015-11-11 DIAGNOSIS — I10 Essential (primary) hypertension: Secondary | ICD-10-CM | POA: Diagnosis not present

## 2015-11-18 DIAGNOSIS — R42 Dizziness and giddiness: Secondary | ICD-10-CM | POA: Diagnosis not present

## 2015-11-22 DIAGNOSIS — R42 Dizziness and giddiness: Secondary | ICD-10-CM | POA: Diagnosis not present

## 2015-12-07 DIAGNOSIS — D1801 Hemangioma of skin and subcutaneous tissue: Secondary | ICD-10-CM | POA: Diagnosis not present

## 2015-12-07 DIAGNOSIS — L821 Other seborrheic keratosis: Secondary | ICD-10-CM | POA: Diagnosis not present

## 2015-12-07 DIAGNOSIS — L570 Actinic keratosis: Secondary | ICD-10-CM | POA: Diagnosis not present

## 2015-12-07 DIAGNOSIS — L918 Other hypertrophic disorders of the skin: Secondary | ICD-10-CM | POA: Diagnosis not present

## 2015-12-07 DIAGNOSIS — D2262 Melanocytic nevi of left upper limb, including shoulder: Secondary | ICD-10-CM | POA: Diagnosis not present

## 2015-12-07 DIAGNOSIS — D0439 Carcinoma in situ of skin of other parts of face: Secondary | ICD-10-CM | POA: Diagnosis not present

## 2015-12-07 DIAGNOSIS — D485 Neoplasm of uncertain behavior of skin: Secondary | ICD-10-CM | POA: Diagnosis not present

## 2016-02-02 ENCOUNTER — Ambulatory Visit: Payer: Medicare Other | Admitting: Cardiology

## 2016-02-08 ENCOUNTER — Other Ambulatory Visit: Payer: Self-pay | Admitting: Cardiovascular Disease

## 2016-02-08 DIAGNOSIS — I6523 Occlusion and stenosis of bilateral carotid arteries: Secondary | ICD-10-CM

## 2016-02-08 DIAGNOSIS — I739 Peripheral vascular disease, unspecified: Secondary | ICD-10-CM

## 2016-02-08 DIAGNOSIS — I714 Abdominal aortic aneurysm, without rupture, unspecified: Secondary | ICD-10-CM

## 2016-02-15 DIAGNOSIS — H52203 Unspecified astigmatism, bilateral: Secondary | ICD-10-CM | POA: Diagnosis not present

## 2016-02-15 DIAGNOSIS — H3500 Unspecified background retinopathy: Secondary | ICD-10-CM | POA: Diagnosis not present

## 2016-02-18 ENCOUNTER — Ambulatory Visit (HOSPITAL_COMMUNITY)
Admission: RE | Admit: 2016-02-18 | Discharge: 2016-02-18 | Disposition: A | Discharge status: Home / Self Care | Payer: Medicare Other | Source: Ambulatory Visit | Attending: Cardiology | Admitting: Cardiology

## 2016-02-18 ENCOUNTER — Other Ambulatory Visit: Payer: Self-pay | Admitting: Cardiovascular Disease

## 2016-02-18 DIAGNOSIS — I739 Peripheral vascular disease, unspecified: Secondary | ICD-10-CM | POA: Insufficient documentation

## 2016-02-18 DIAGNOSIS — I6523 Occlusion and stenosis of bilateral carotid arteries: Secondary | ICD-10-CM | POA: Insufficient documentation

## 2016-02-18 DIAGNOSIS — I2510 Atherosclerotic heart disease of native coronary artery without angina pectoris: Secondary | ICD-10-CM | POA: Insufficient documentation

## 2016-02-18 DIAGNOSIS — I10 Essential (primary) hypertension: Secondary | ICD-10-CM | POA: Insufficient documentation

## 2016-02-18 DIAGNOSIS — K219 Gastro-esophageal reflux disease without esophagitis: Secondary | ICD-10-CM | POA: Insufficient documentation

## 2016-02-18 DIAGNOSIS — E785 Hyperlipidemia, unspecified: Secondary | ICD-10-CM | POA: Insufficient documentation

## 2016-02-18 DIAGNOSIS — I708 Atherosclerosis of other arteries: Secondary | ICD-10-CM | POA: Diagnosis not present

## 2016-02-18 DIAGNOSIS — R938 Abnormal findings on diagnostic imaging of other specified body structures: Secondary | ICD-10-CM | POA: Insufficient documentation

## 2016-02-18 DIAGNOSIS — I700 Atherosclerosis of aorta: Secondary | ICD-10-CM | POA: Diagnosis not present

## 2016-02-23 ENCOUNTER — Telehealth: Payer: Self-pay | Admitting: Cardiovascular Disease

## 2016-02-23 NOTE — Telephone Encounter (Signed)
New message ° ° ° ° ° ° °Pt returning nurse call  °

## 2016-02-24 NOTE — Telephone Encounter (Signed)
Follow up ° ° ° ° ° ° ° ° ° °Pt returning nurses call °

## 2016-02-24 NOTE — Telephone Encounter (Signed)
Returned call to patient. I had called patient in previous day to give him the results of his carotid dopplers, aorta/IVC/iliac and ABI dopplers. Results given to patient and he verbalized understanding.  Dr Gwenlyn Found also request pt to have an appt to discuss results. Appointment made with Dr Gwenlyn Found on 03/22/16. Patient also has an appt with Kerin Ransom on 03/08/16 for a 6 month f/u. Appt with [facility] cancelled. Patient verbalized understanding.

## 2016-03-08 ENCOUNTER — Ambulatory Visit: Payer: Medicare Other | Admitting: Cardiology

## 2016-03-09 DIAGNOSIS — H903 Sensorineural hearing loss, bilateral: Secondary | ICD-10-CM | POA: Diagnosis not present

## 2016-03-09 DIAGNOSIS — M2669 Other specified disorders of temporomandibular joint: Secondary | ICD-10-CM | POA: Insufficient documentation

## 2016-03-14 DIAGNOSIS — K635 Polyp of colon: Secondary | ICD-10-CM | POA: Diagnosis not present

## 2016-03-14 DIAGNOSIS — Z6829 Body mass index (BMI) 29.0-29.9, adult: Secondary | ICD-10-CM | POA: Diagnosis not present

## 2016-03-14 DIAGNOSIS — I739 Peripheral vascular disease, unspecified: Secondary | ICD-10-CM | POA: Diagnosis not present

## 2016-03-14 DIAGNOSIS — R7309 Other abnormal glucose: Secondary | ICD-10-CM | POA: Diagnosis not present

## 2016-03-14 DIAGNOSIS — E784 Other hyperlipidemia: Secondary | ICD-10-CM | POA: Diagnosis not present

## 2016-03-14 DIAGNOSIS — N200 Calculus of kidney: Secondary | ICD-10-CM | POA: Diagnosis not present

## 2016-03-14 DIAGNOSIS — I6529 Occlusion and stenosis of unspecified carotid artery: Secondary | ICD-10-CM | POA: Diagnosis not present

## 2016-03-14 DIAGNOSIS — R739 Hyperglycemia, unspecified: Secondary | ICD-10-CM | POA: Diagnosis not present

## 2016-03-14 DIAGNOSIS — I10 Essential (primary) hypertension: Secondary | ICD-10-CM | POA: Diagnosis not present

## 2016-03-14 DIAGNOSIS — I2510 Atherosclerotic heart disease of native coronary artery without angina pectoris: Secondary | ICD-10-CM | POA: Diagnosis not present

## 2016-03-22 ENCOUNTER — Ambulatory Visit (INDEPENDENT_AMBULATORY_CARE_PROVIDER_SITE_OTHER): Payer: Medicare Other | Admitting: Cardiovascular Disease

## 2016-03-22 ENCOUNTER — Encounter: Payer: Self-pay | Admitting: Cardiovascular Disease

## 2016-03-22 ENCOUNTER — Other Ambulatory Visit: Payer: Self-pay | Admitting: *Deleted

## 2016-03-22 VITALS — BP 146/60 | HR 71 | Ht 65.0 in | Wt 174.0 lb

## 2016-03-22 DIAGNOSIS — E7800 Pure hypercholesterolemia, unspecified: Secondary | ICD-10-CM

## 2016-03-22 DIAGNOSIS — I739 Peripheral vascular disease, unspecified: Secondary | ICD-10-CM | POA: Diagnosis not present

## 2016-03-22 DIAGNOSIS — Z01818 Encounter for other preprocedural examination: Secondary | ICD-10-CM | POA: Diagnosis not present

## 2016-03-22 DIAGNOSIS — I6523 Occlusion and stenosis of bilateral carotid arteries: Secondary | ICD-10-CM

## 2016-03-22 DIAGNOSIS — I779 Disorder of arteries and arterioles, unspecified: Secondary | ICD-10-CM

## 2016-03-22 DIAGNOSIS — D689 Coagulation defect, unspecified: Secondary | ICD-10-CM

## 2016-03-22 NOTE — Assessment & Plan Note (Signed)
History of hyperlipidemia on statin therapy followed by his PCP 

## 2016-03-22 NOTE — Progress Notes (Signed)
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 He was formally a patient of [name].  I last saw him in the office 08/04/15.[name] His cardiovascular status profile is remarkable for remote tobacco abuse, 2 hypertension and hyperlipidemia. He is not diabetic. He does [place] 5 days a week 25 minutes a day. He hasn't known cardiovascular disease status post stenting of his proximal mid and distal RCA using overlapping stents as well as the proximal AV groove circumflex with a known occluded OM branch which is old. He has normal LV function by 2-D echo performed 02/07/12 a nonischemic Myoview 2010. He denies chest pain, shortness of breath or claudication. He had carotid Dopplers performed 01/09/14 that showed a widely patent right carotid endarterectomy site with moderate left ICA stenosis remained stable. Since I saw him in January of this year he's noticed increasing bilateral lower extremity claudication which is lifestyle limiting. Dopplers have shown a decline in his ABIs bilaterally. Does have high frequency signals in his right greater than left iliac systems. He wishes to proceed with angiography and potential intervention.  Current Outpatient Prescriptions  Medication Sig Dispense Refill  . amLODipine (NORVASC) 5 MG tablet Take 5 mg by mouth daily.      . Ascorbic Acid (VITAMIN C PO) Take by mouth.      [name] aspirin 81 MG tablet Take 81 mg by mouth daily.      [name] atenolol (TENORMIN) 50 MG tablet Take 50 mg by mouth daily.      [name] atorvastatin (LIPITOR) 40 MG tablet Take 20 mg by mouth daily at 6 PM.     . benzonatate (TESSALON) 100 MG capsule   0  . BIOTIN PO Take by mouth.      [name] CALCIUM  CITRATE PO Take by mouth.      . Cholecalciferol (VITAMIN D-3 PO) Take 1,000 Units by mouth daily.     . cilostazol (PLETAL) 100 MG tablet Take 100 mg by mouth 2 (two) times daily.      . clopidogrel (PLAVIX) 75 MG tablet Take 75 mg by mouth daily.      . diazepam (VALIUM) 5 MG tablet Take 5 mg by mouth every 8 (eight) hours as needed for anxiety.    . Evening Primrose topical oil Apply topically as needed.      . fish oil-omega-3 fatty acids 1000 MG capsule Take 2 g by mouth daily.      [name] GLUCOSAMINE PO Take 1,500 mg by mouth daily.     . hydrochlorothiazide 25 MG tablet Take 25 mg by mouth daily.      [name] losartan (COZAAR) 50 MG tablet Take 50 mg by mouth daily.    . metoCLOPramide (REGLAN) 5 MG tablet Take 5 mg by mouth daily.      . Multiple Vitamin (MULTIVITAMIN PO) Take by mouth.      [name] omeprazole (PRILOSEC) 40 MG capsule Take 20 mg by mouth 2 (two) times daily.     . potassium chloride SA (K-DUR,KLOR-CON) 20 MEQ tablet Take 20 mEq by mouth once.     . tamsulosin (FLOMAX) 0.4 MG CAPS capsule Take by mouth once.     No current  facility-administered medications for this visit.     Allergies  Allergen Reactions  . Lisinopril Cough  . Niaspan [Niacin Er] Rash    Social History   Social History  . Marital status: Married    Spouse name: N/A  . Number of children: N/A  . Years of education: N/A   Occupational History  . Not on file.   Social History Main Topics  . Smoking status: Former Smoker    Quit date: 07/31/1968  . Smokeless tobacco: Never Used  . Alcohol use 2.4 oz/week    4 Glasses of wine per week  . Drug use: Unknown  . Sexual activity: Not on file   Other Topics Concern  . Not on file   Social History Narrative  . No narrative on file     Review of Systems: General: negative for chills, fever, night sweats or weight changes.  Cardiovascular: negative for chest pain, dyspnea on exertion, edema, orthopnea, palpitations, paroxysmal nocturnal dyspnea or  shortness of breath Dermatological: negative for rash Respiratory: negative for cough or wheezing Urologic: negative for hematuria Abdominal: negative for nausea, vomiting, diarrhea, bright red blood per rectum, melena, or hematemesis Neurologic: negative for visual changes, syncope, or dizziness All other systems reviewed and are otherwise negative except as noted above.    Blood pressure (!) 146/60, pulse 71, height 5\' 5"  (1.651 m), weight 174 lb (78.9 kg).  General appearance: alert and no distress Neck: no adenopathy, no carotid bruit, no JVD, supple, symmetrical, trachea midline and thyroid not enlarged, symmetric, no tenderness/mass/nodules Lungs: clear to auscultation bilaterally Heart: regular rate and rhythm, S1, S2 normal, no murmur, click, rub or gallop Extremities: extremities normal, atraumatic, no cyanosis or edema  EKG sinus rhythm at 71 without ST or T-wave changes. I personally reviewed this EKG  ASSESSMENT AND PLAN:   HYPERCHOLESTEROLEMIA History of hyperlipidemia on statin therapy followed by his PCP  Essential hypertension History of hypertension blood pressure measured 146/60. He is on hydrochlorothiazide, atenolol and losartan. Continue current meds at current dosing  Coronary atherosclerosis History of coronary artery disease status post stenting of his proximal mid and distal RCA using overlapping stents as well as his proximal AV groove circumflex for known occluded OM branch and normal LV function. Myoview performed 02/07/12 was nonischemic. The patient denies chest pain or shortness of breath.  Peripheral arterial disease History of peripheral arterial disease with known narrow distal abdominal aorta and recent Dopplers that showed decline in his ABIs bilaterally with progressive lifestyle limiting claudication. Based on this we decided to proceed with outpatient angiography and potential intervention.  Bilateral carotid artery disease History of carotid  artery disease with moderate left ICA stenosis status post remote remote right carotid endarterectomy with a widely patent endarterectomy site by consultants ultrasound 02/18/16. We are following this on an annual basis.      [name] FACP,FACC,FAHA, [facility] 03/22/2016 10:38 AM

## 2016-03-22 NOTE — Assessment & Plan Note (Signed)
History of hypertension blood pressure measured 146/60. He is on hydrochlorothiazide, atenolol and losartan. Continue current meds at current dosing

## 2016-03-22 NOTE — Patient Instructions (Signed)
Medication Instructions:  Your physician recommends that you continue on your current medications as directed. Please refer to the Current Medication list given to you today.   Testing/Procedures: Dr. Gwenlyn Found has ordered a peripheral angiogram to be done at [facility].  This procedure is going to look at the bloodflow in your lower extremities.  If Dr. Gwenlyn Found is able to open up the arteries, you will have to spend one night in the hospital.  If he is not able to open the arteries, you will be able to go home that same day.    After the procedure, you will not be allowed to drive for 3 days or push, pull, or lift anything greater than 10 lbs for one week.    You will be required to have the following tests prior to the procedure:  1. Blood work-the blood work can be done no more than 14 days prior to the procedure.  It can be done at any [facility] lab.  There is one downstairs on the first floor of this building and one in the [facility] building [phone] [address], [address])  2. Chest Xray-the chest xray order has already been placed at the [place].     *REPS  NONE PER DR BERRY  Puncture site RIGHT GROIN   If you need a refill on your cardiac medications before your next appointment, please call your pharmacy.

## 2016-03-22 NOTE — Assessment & Plan Note (Signed)
History of peripheral arterial disease with known narrow distal abdominal aorta and recent Dopplers that showed decline in his ABIs bilaterally with progressive lifestyle limiting claudication. Based on this we decided to proceed with outpatient angiography and potential intervention.

## 2016-03-22 NOTE — Assessment & Plan Note (Signed)
History of carotid artery disease with moderate left ICA stenosis status post remote remote right carotid endarterectomy with a widely patent endarterectomy site by consultants ultrasound 02/18/16. We are following this on an annual basis.

## 2016-03-22 NOTE — Assessment & Plan Note (Signed)
History of coronary artery disease status post stenting of his proximal mid and distal RCA using overlapping stents as well as his proximal AV groove circumflex for known occluded OM branch and normal LV function. Myoview performed 02/07/12 was nonischemic. The patient denies chest pain or shortness of breath.

## 2016-04-12 ENCOUNTER — Ambulatory Visit
Admission: RE | Admit: 2016-04-12 | Discharge: 2016-04-12 | Disposition: A | Discharge status: Home / Self Care | Payer: Medicare Other | Source: Ambulatory Visit | Attending: Cardiovascular Disease | Admitting: Cardiovascular Disease

## 2016-04-12 DIAGNOSIS — D689 Coagulation defect, unspecified: Secondary | ICD-10-CM

## 2016-04-12 DIAGNOSIS — Z01818 Encounter for other preprocedural examination: Secondary | ICD-10-CM

## 2016-04-12 DIAGNOSIS — I739 Peripheral vascular disease, unspecified: Secondary | ICD-10-CM

## 2016-04-12 DIAGNOSIS — E7800 Pure hypercholesterolemia, unspecified: Secondary | ICD-10-CM

## 2016-04-13 ENCOUNTER — Telehealth: Payer: Self-pay | Admitting: *Deleted

## 2016-04-13 LAB — CBC WITH DIFFERENTIAL/PLATELET
BASOS ABS: 0 {cells}/uL (ref 0–200)
Basophils Relative: 0 %
EOS ABS: 228 {cells}/uL (ref 15–500)
Eosinophils Relative: 3 %
HEMATOCRIT: 40.4 % (ref 38.5–50.0)
Hemoglobin: 13.6 g/dL (ref 13.2–17.1)
LYMPHS PCT: 19 %
Lymphs Abs: 1444 cells/uL (ref 850–3900)
MCH: 29.7 pg (ref 27.0–33.0)
MCHC: 33.7 g/dL (ref 32.0–36.0)
MCV: 88.2 fL (ref 80.0–100.0)
MONO ABS: 532 {cells}/uL (ref 200–950)
MPV: 9.8 fL (ref 7.5–12.5)
Monocytes Relative: 7 %
NEUTROS PCT: 71 %
Neutro Abs: 5396 cells/uL (ref 1500–7800)
Platelets: 220 10*3/uL (ref 140–400)
RBC: 4.58 MIL/uL (ref 4.20–5.80)
RDW: 13.5 % (ref 11.0–15.0)
WBC: 7.6 10*3/uL (ref 3.8–10.8)

## 2016-04-13 LAB — APTT: aPTT: 27 s (ref 22–34)

## 2016-04-13 LAB — BASIC METABOLIC PANEL
BUN: 14 mg/dL (ref 7–25)
CO2: 29 mmol/L (ref 20–31)
CREATININE: 0.79 mg/dL (ref 0.70–1.11)
Calcium: 9.3 mg/dL (ref 8.6–10.3)
Chloride: 106 mmol/L (ref 98–110)
Glucose, Bld: 119 mg/dL — ABNORMAL HIGH (ref 65–99)
Potassium: 3.8 mmol/L (ref 3.5–5.3)
Sodium: 143 mmol/L (ref 135–146)

## 2016-04-13 LAB — PROTIME-INR
INR: 1
PROTHROMBIN TIME: 10.4 s (ref 9.0–11.5)

## 2016-04-13 LAB — TSH: TSH: 1.95 mIU/L (ref 0.40–4.50)

## 2016-04-13 NOTE — Telephone Encounter (Signed)
-----   Message from Lorretta Harp, MD sent at 04/13/2016  3:10 PM EDT ----- Labs OK for PV angio

## 2016-04-13 NOTE — Telephone Encounter (Signed)
LEFT DETAIL MESSAGE ON SECURE VOICEMAIL  ANY QUESTION MAY CALL BACK

## 2016-04-13 NOTE — Telephone Encounter (Signed)
-----   Message from Lorretta Harp, MD sent at 04/12/2016  4:39 PM EDT ----- NAD

## 2016-04-24 ENCOUNTER — Encounter (HOSPITAL_COMMUNITY)
Admission: RE | Disposition: A | Discharge status: Home / Self Care | Payer: Self-pay | Source: Ambulatory Visit | Attending: Cardiovascular Disease

## 2016-04-24 ENCOUNTER — Encounter (HOSPITAL_COMMUNITY): Payer: Self-pay | Admitting: General Practice

## 2016-04-24 ENCOUNTER — Ambulatory Visit (HOSPITAL_COMMUNITY)
Admission: RE | Admit: 2016-04-24 | Discharge: 2016-04-25 | Disposition: A | Discharge status: Home / Self Care | Payer: Medicare Other | Source: Ambulatory Visit | Attending: Cardiovascular Disease | Admitting: Cardiovascular Disease

## 2016-04-24 DIAGNOSIS — Z7982 Long term (current) use of aspirin: Secondary | ICD-10-CM | POA: Insufficient documentation

## 2016-04-24 DIAGNOSIS — I10 Essential (primary) hypertension: Secondary | ICD-10-CM | POA: Diagnosis not present

## 2016-04-24 DIAGNOSIS — Z7902 Long term (current) use of antithrombotics/antiplatelets: Secondary | ICD-10-CM | POA: Diagnosis not present

## 2016-04-24 DIAGNOSIS — Z87891 Personal history of nicotine dependence: Secondary | ICD-10-CM | POA: Diagnosis not present

## 2016-04-24 DIAGNOSIS — I6522 Occlusion and stenosis of left carotid artery: Secondary | ICD-10-CM | POA: Insufficient documentation

## 2016-04-24 DIAGNOSIS — I2510 Atherosclerotic heart disease of native coronary artery without angina pectoris: Secondary | ICD-10-CM | POA: Insufficient documentation

## 2016-04-24 DIAGNOSIS — E785 Hyperlipidemia, unspecified: Secondary | ICD-10-CM | POA: Diagnosis not present

## 2016-04-24 DIAGNOSIS — Z23 Encounter for immunization: Secondary | ICD-10-CM | POA: Diagnosis not present

## 2016-04-24 DIAGNOSIS — Z955 Presence of coronary angioplasty implant and graft: Secondary | ICD-10-CM | POA: Diagnosis not present

## 2016-04-24 DIAGNOSIS — I70213 Atherosclerosis of native arteries of extremities with intermittent claudication, bilateral legs: Secondary | ICD-10-CM | POA: Diagnosis not present

## 2016-04-24 DIAGNOSIS — I700 Atherosclerosis of aorta: Secondary | ICD-10-CM | POA: Insufficient documentation

## 2016-04-24 DIAGNOSIS — I739 Peripheral vascular disease, unspecified: Secondary | ICD-10-CM | POA: Diagnosis present

## 2016-04-24 HISTORY — PX: PERIPHERAL VASCULAR CATHETERIZATION: SHX172C

## 2016-04-24 HISTORY — PX: AORTOGRAM: SHX6300

## 2016-04-24 LAB — POCT ACTIVATED CLOTTING TIME
ACTIVATED CLOTTING TIME: 213 s
ACTIVATED CLOTTING TIME: 175 s

## 2016-04-24 SURGERY — LOWER EXTREMITY ANGIOGRAPHY

## 2016-04-24 MED ORDER — AMLODIPINE BESYLATE 5 MG PO TABS
5.0000 mg | ORAL_TABLET | Freq: Every day | ORAL | Status: DC
  Filled 2016-04-24: qty 1

## 2016-04-24 MED ORDER — LOSARTAN POTASSIUM 50 MG PO TABS
50.0000 mg | ORAL_TABLET | Freq: Every day | ORAL | Status: DC
  Administered 2016-04-24: 14:00:00 50 mg via ORAL
  Filled 2016-04-24 (×2): qty 1

## 2016-04-24 MED ORDER — TAMSULOSIN HCL 0.4 MG PO CAPS
0.8000 mg | ORAL_CAPSULE | Freq: Every day | ORAL | Status: DC
  Administered 2016-04-24: 0.8 mg via ORAL
  Filled 2016-04-24 (×2): qty 2

## 2016-04-24 MED ORDER — CLOPIDOGREL BISULFATE 75 MG PO TABS
75.0000 mg | ORAL_TABLET | Freq: Once | ORAL | Status: AC
  Administered 2016-04-24: 75 mg via ORAL

## 2016-04-24 MED ORDER — HEPARIN SODIUM (PORCINE) 1000 UNIT/ML IJ SOLN
INTRAMUSCULAR | Status: AC
  Filled 2016-04-24: qty 1

## 2016-04-24 MED ORDER — ASPIRIN 81 MG PO CHEW
81.0000 mg | CHEWABLE_TABLET | ORAL | Status: AC
  Administered 2016-04-24: 81 mg via ORAL

## 2016-04-24 MED ORDER — HEPARIN (PORCINE) IN NACL 2-0.9 UNIT/ML-% IJ SOLN
INTRAMUSCULAR | Status: DC | PRN
  Administered 2016-04-24: 1000 mL via INTRA_ARTERIAL

## 2016-04-24 MED ORDER — ATORVASTATIN CALCIUM 40 MG PO TABS
40.0000 mg | ORAL_TABLET | Freq: Every day | ORAL | Status: DC
  Administered 2016-04-24: 40 mg via ORAL
  Filled 2016-04-24: qty 1

## 2016-04-24 MED ORDER — HYDROCHLOROTHIAZIDE 25 MG PO TABS
25.0000 mg | ORAL_TABLET | Freq: Every day | ORAL | Status: DC
  Administered 2016-04-24: 14:00:00 25 mg via ORAL
  Filled 2016-04-24 (×2): qty 1

## 2016-04-24 MED ORDER — IODIXANOL 320 MG/ML IV SOLN
INTRAVENOUS | Status: DC | PRN
  Administered 2016-04-24: 190 mL via INTRA_ARTERIAL

## 2016-04-24 MED ORDER — LIDOCAINE HCL (PF) 1 % IJ SOLN
INTRAMUSCULAR | Status: AC
  Filled 2016-04-24: qty 30

## 2016-04-24 MED ORDER — SODIUM CHLORIDE 0.9 % WEIGHT BASED INFUSION: 1.0000 mL/kg/h | INTRAVENOUS | Status: DC

## 2016-04-24 MED ORDER — DIAZEPAM 5 MG PO TABS
5.0000 mg | ORAL_TABLET | Freq: Three times a day (TID) | ORAL | Status: DC | PRN
  Administered 2016-04-24: 22:00:00 5 mg via ORAL
  Filled 2016-04-24: qty 1

## 2016-04-24 MED ORDER — SODIUM CHLORIDE 0.9% FLUSH: 3.0000 mL | INTRAVENOUS | Status: DC | PRN

## 2016-04-24 MED ORDER — FENTANYL CITRATE (PF) 100 MCG/2ML IJ SOLN
INTRAMUSCULAR | Status: DC | PRN
  Administered 2016-04-24 (×2): 25 ug via INTRAVENOUS

## 2016-04-24 MED ORDER — LIDOCAINE HCL (PF) 1 % IJ SOLN
INTRAMUSCULAR | Status: DC | PRN
  Administered 2016-04-24: 7 mL

## 2016-04-24 MED ORDER — METOCLOPRAMIDE HCL 5 MG PO TABS
5.0000 mg | ORAL_TABLET | Freq: Every day | ORAL | Status: DC
  Administered 2016-04-24: 5 mg via ORAL
  Filled 2016-04-24 (×2): qty 1

## 2016-04-24 MED ORDER — ASPIRIN 81 MG PO CHEW
CHEWABLE_TABLET | ORAL | Status: AC
  Filled 2016-04-24: qty 1

## 2016-04-24 MED ORDER — CILOSTAZOL 100 MG PO TABS
100.0000 mg | ORAL_TABLET | Freq: Two times a day (BID) | ORAL | Status: DC
  Administered 2016-04-24: 100 mg via ORAL
  Filled 2016-04-24 (×2): qty 1

## 2016-04-24 MED ORDER — CLOPIDOGREL BISULFATE 75 MG PO TABS: 75.0000 mg | ORAL_TABLET | Freq: Every day | ORAL | Status: DC

## 2016-04-24 MED ORDER — INFLUENZA VAC SPLIT QUAD 0.5 ML IM SUSY
0.5000 mL | PREFILLED_SYRINGE | INTRAMUSCULAR | Status: AC
  Administered 2016-04-25: 0.5 mL via INTRAMUSCULAR
  Filled 2016-04-24: qty 0.5

## 2016-04-24 MED ORDER — OMEGA-3-ACID ETHYL ESTERS 1 G PO CAPS
2.0000 g | ORAL_CAPSULE | Freq: Every day | ORAL | Status: DC
  Administered 2016-04-24: 14:00:00 2 g via ORAL
  Filled 2016-04-24 (×3): qty 2

## 2016-04-24 MED ORDER — ASPIRIN EC 325 MG PO TBEC
325.0000 mg | DELAYED_RELEASE_TABLET | Freq: Every day | ORAL | Status: DC
  Filled 2016-04-24: qty 1

## 2016-04-24 MED ORDER — MIDAZOLAM HCL 2 MG/2ML IJ SOLN
INTRAMUSCULAR | Status: DC | PRN
  Administered 2016-04-24 (×2): 1 mg via INTRAVENOUS

## 2016-04-24 MED ORDER — ONDANSETRON HCL 4 MG/2ML IJ SOLN: 4.0000 mg | Freq: Four times a day (QID) | INTRAMUSCULAR | Status: DC | PRN

## 2016-04-24 MED ORDER — SODIUM CHLORIDE 0.9 % WEIGHT BASED INFUSION
3.0000 mL/kg/h | INTRAVENOUS | Status: DC
  Administered 2016-04-24: 3 mL/kg/h via INTRAVENOUS

## 2016-04-24 MED ORDER — FENTANYL CITRATE (PF) 100 MCG/2ML IJ SOLN
INTRAMUSCULAR | Status: AC
Start: 2016-04-24 — End: 2016-04-24
  Filled 2016-04-24: qty 2

## 2016-04-24 MED ORDER — CLOPIDOGREL BISULFATE 75 MG PO TABS
ORAL_TABLET | ORAL | Status: AC
  Filled 2016-04-24: qty 1

## 2016-04-24 MED ORDER — PANTOPRAZOLE SODIUM 40 MG PO TBEC
40.0000 mg | DELAYED_RELEASE_TABLET | Freq: Every day | ORAL | Status: DC
  Administered 2016-04-24: 14:00:00 40 mg via ORAL
  Filled 2016-04-24 (×2): qty 1

## 2016-04-24 MED ORDER — SODIUM CHLORIDE 0.9 % IV SOLN: INTRAVENOUS | Status: AC

## 2016-04-24 MED ORDER — CLOPIDOGREL BISULFATE 75 MG PO TABS
75.0000 mg | ORAL_TABLET | Freq: Every day | ORAL | Status: DC
  Filled 2016-04-24: qty 1

## 2016-04-24 MED ORDER — ACETAMINOPHEN 325 MG PO TABS: 650.0000 mg | ORAL_TABLET | ORAL | Status: DC | PRN

## 2016-04-24 MED ORDER — HEPARIN SODIUM (PORCINE) 1000 UNIT/ML IJ SOLN
INTRAMUSCULAR | Status: DC | PRN
  Administered 2016-04-24: 2000 [IU] via INTRAVENOUS
  Administered 2016-04-24: 6000 [IU] via INTRAVENOUS

## 2016-04-24 MED ORDER — ANGIOPLASTY BOOK
Freq: Once | Status: AC
  Administered 2016-04-24: 21:00:00
  Filled 2016-04-24: qty 1

## 2016-04-24 MED ORDER — POTASSIUM CHLORIDE CRYS ER 20 MEQ PO TBCR
20.0000 meq | EXTENDED_RELEASE_TABLET | Freq: Every day | ORAL | Status: DC
Start: 2016-04-24 — End: 2016-04-25
  Administered 2016-04-24: 14:00:00 20 meq via ORAL
  Filled 2016-04-24 (×2): qty 1

## 2016-04-24 MED ORDER — ATENOLOL 50 MG PO TABS
50.0000 mg | ORAL_TABLET | Freq: Every day | ORAL | Status: DC
  Filled 2016-04-24: qty 1

## 2016-04-24 MED ORDER — NITROGLYCERIN 1 MG/10 ML FOR IR/CATH LAB
INTRA_ARTERIAL | Status: AC
  Filled 2016-04-24: qty 10

## 2016-04-24 MED ORDER — BENZONATATE 100 MG PO CAPS: 100.0000 mg | ORAL_CAPSULE | Freq: Three times a day (TID) | ORAL | Status: DC | PRN

## 2016-04-24 MED ORDER — HYDRALAZINE HCL 20 MG/ML IJ SOLN: 10.0000 mg | INTRAMUSCULAR | Status: DC | PRN

## 2016-04-24 MED ORDER — MORPHINE SULFATE (PF) 2 MG/ML IV SOLN: 2.0000 mg | INTRAVENOUS | Status: DC | PRN

## 2016-04-24 MED ORDER — NITROGLYCERIN 1 MG/10 ML FOR IR/CATH LAB
INTRA_ARTERIAL | Status: DC | PRN
  Administered 2016-04-24: 200 ug via INTRA_ARTERIAL

## 2016-04-24 MED ORDER — MIDAZOLAM HCL 2 MG/2ML IJ SOLN
INTRAMUSCULAR | Status: AC
  Filled 2016-04-24: qty 2

## 2016-04-24 MED ORDER — HEPARIN (PORCINE) IN NACL 2-0.9 UNIT/ML-% IJ SOLN
INTRAMUSCULAR | Status: AC
  Filled 2016-04-24: qty 1000

## 2016-04-24 MED ORDER — ASPIRIN 81 MG PO TABS: 81.0000 mg | ORAL_TABLET | Freq: Every day | ORAL | Status: DC

## 2016-04-24 SURGICAL SUPPLY — 19 items
BALLN MUSTANG 12X20X75 (BALLOONS) ×3
BALLOON MUSTANG 12X20X75 (BALLOONS) ×2 IMPLANT
CATH ANGIO 5F PIGTAIL 65CM (CATHETERS) ×3 IMPLANT
CATH CROSS OVER TEMPO 5F (CATHETERS) ×3 IMPLANT
CATH STRAIGHT 5FR 65CM (CATHETERS) ×3 IMPLANT
KIT ENCORE 26 ADVANTAGE (KITS) ×3 IMPLANT
KIT MICROINTRODUCER STIFF 5F (SHEATH) ×3 IMPLANT
KIT PV (KITS) ×3 IMPLANT
SHEATH BRITE TIP 7FR 35CM (SHEATH) ×3 IMPLANT
SHEATH PINNACLE 5F 10CM (SHEATH) ×3 IMPLANT
SHEATH PINNACLE 7F 10CM (SHEATH) ×3 IMPLANT
STENT OMNILINK ELITE 10X29X80 (Permanent Stent) ×3 IMPLANT
STOPCOCK MORSE 400PSI 3WAY (MISCELLANEOUS) ×3 IMPLANT
SYR MEDRAD MARK V 150ML (SYRINGE) ×3 IMPLANT
TAPE RADIOPAQUE TURBO (MISCELLANEOUS) ×3 IMPLANT
TRANSDUCER W/STOPCOCK (MISCELLANEOUS) ×3 IMPLANT
TRAY PV CATH (CUSTOM PROCEDURE TRAY) ×3 IMPLANT
TUBING CIL FLEX 10 FLL-RA (TUBING) ×3 IMPLANT
WIRE HITORQ VERSACORE ST 145CM (WIRE) ×3 IMPLANT

## 2016-04-24 NOTE — Interval H&P Note (Signed)
History and Physical Interval Note:  04/24/2016 10:59 AM  Nathan Santiago  has presented today for surgery, with the diagnosis of claudication  The various methods of treatment have been discussed with the patient and family. After consideration of risks, benefits and other options for treatment, the patient has consented to  Procedure(s): Lower Extremity Angiography (N/A) as a surgical intervention .  The patient's history has been reviewed, patient examined, no change in status, stable for surgery.  I have reviewed the patient's chart and labs.  Questions were answered to the patient's satisfaction.     [name]

## 2016-04-24 NOTE — H&P (Signed)
04/24/16 Nathan Santiago   1931/02/15  [id number]  Primary Physician [name], MD Primary Cardiologist: [name] [name]  HPI:  Mr. 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 He was formally a patient of [name] and now follows with Dr. Gwenlyn Found.  His cardiovascular status profile is remarkable for remote tobacco abuse, 2 hypertension and hyperlipidemia. He is not diabetic. He [place] 5 days a week 25 minutes a day. He hasn't known cardiovascular disease status post stenting of his proximal mid and distal RCA using overlapping stents as well as the proximal AV groove circumflex with a known occluded OM branch which is old. He has normal LV function by 2-D echo performed 02/07/12 a nonischemic Myoview 2010. He had carotid Dopplers performed 01/09/14 that showed a widely patent right carotid endarterectomy site with moderate left ICA stenosis remained stable. He has noted several months of worsening claudication, right greater than left, which is present when walking 50-100 feet. Dopplers have shown a decline in his ABIs bilaterally. Does have high frequency signals in his right greater than left iliac systems. He wishes to proceed with angiography and potential intervention.  No current facility-administered medications on file prior to encounter.    Current Outpatient Prescriptions on File Prior to Encounter  Medication Sig Dispense Refill  . amLODipine (NORVASC) 5 MG tablet Take 5 mg by mouth daily.      . Ascorbic Acid (VITAMIN C PO) Take 1 tablet by mouth.     [name] aspirin 81 MG tablet Take 81 mg by mouth daily.      [name] atenolol (TENORMIN) 50 MG tablet Take 50 mg by mouth daily.      [name] atorvastatin (LIPITOR) 40 MG tablet Take 40 mg by mouth daily at 6 PM.     . BIOTIN PO Take 1 tablet by mouth daily.     [name]  CALCIUM CITRATE PO Take 1 tablet by mouth daily.     . Cholecalciferol (VITAMIN D-3 PO) Take 1,000 Units by mouth daily.     . cilostazol (PLETAL) 100 MG tablet Take 100 mg by mouth 2 (two) times daily.      . clopidogrel (PLAVIX) 75 MG tablet Take 75 mg by mouth daily.      . fish oil-omega-3 fatty acids 1000 MG capsule Take 2 g by mouth daily.      [name] GLUCOSAMINE PO Take 1,500 mg by mouth daily.     . hydrochlorothiazide 25 MG tablet Take 25 mg by mouth daily.      [name] losartan (COZAAR) 50 MG tablet Take 50 mg by mouth daily.    . metoCLOPramide (REGLAN) 5 MG tablet Take 5 mg by mouth daily.      . Multiple Vitamin (MULTIVITAMIN PO) Take by mouth.      [name] omeprazole (PRILOSEC) 40 MG capsule Take 80 mg by mouth daily.     . potassium chloride SA (K-DUR,KLOR-CON) 20 MEQ tablet Take 20 mEq by mouth daily.     . tamsulosin (FLOMAX) 0.4 MG CAPS capsule Take 0.8 mg by mouth daily.     . benzonatate (TESSALON) 100 MG capsule Take 100 mg by mouth 3 (three) times daily as needed for cough.   0  . diazepam (VALIUM) 5 MG tablet Take 5 mg by mouth every 8 (eight) hours as  needed for anxiety.           Allergies  Allergen Reactions  . Lisinopril Cough  . Niaspan [Niacin Er] Rash    Social History        Social History  . Marital status: Married    Spouse name: N/A  . Number of children: N/A  . Years of education: N/A      Occupational History  . Not on file.        Social History Main Topics  . Smoking status: Former Smoker    Quit date: 07/31/1968  . Smokeless tobacco: Never Used  . Alcohol use 2.4 oz/week    4 Glasses of wine per week  . Drug use: Unknown  . Sexual activity: Not on file       Other Topics Concern  . Not on file      Social History Narrative  . No narrative on file     Review of Systems: General: negative for chills, fever, night sweats or weight changes.  Cardiovascular: negative for chest pain, dyspnea on exertion, edema, orthopnea,  palpitations, paroxysmal nocturnal dyspnea or shortness of breath Dermatological: negative for rash Respiratory: negative for cough or wheezing Urologic: negative for hematuria Abdominal: negative for nausea, vomiting, diarrhea, bright red blood per rectum, melena, or hematemesis Neurologic: negative for visual changes, syncope, or dizziness All other systems reviewed and are otherwise negative except as noted above.   Physical Exam: Temp:  [98 F (36.7 C)] 98 F (36.7 C) (09/25 0934) Pulse Rate:  [62] 62 (09/25 0934) Resp:  [18] 18 (09/25 0934) BP: (135)/(60) 135/60 (09/25 0934) SpO2:  [96 %] 96 % (09/25 0934) Weight:  [166 lb (75.3 kg)] 166 lb (75.3 kg) (09/25 0934) General appearance: alert and no distress Neck: no adenopathy, no carotid bruit, no JVD, supple, symmetrical, trachea midline and thyroid not enlarged, symmetric, no tenderness/mass/nodules Lungs: clear to auscultation bilaterally Heart: regular rate and rhythm, S1, S2 normal, no murmur, click, rub or gallop Extremities: extremities normal, atraumatic, no cyanosis or edema  ASSESSMENT AND PLAN:   Peripheral arterial disease with claudication History of peripheral arterial disease with known narrow distal abdominal aorta and recent Dopplers that showed decline in his ABIs bilaterally with progressive lifestyle limiting claudication. Based on this we decided to proceed with outpatient angiography and potential intervention.  Risks and benefits of the procedure were discussed with the patient, who provided informed written consent.  Nelva Bush, MD [facility] Pager: [phone]

## 2016-04-24 NOTE — Care Management Note (Signed)
Case Management Note  Patient Details  Name: MASANOBU COLAS MRN: [id number] Date of Birth: 05-22-1931  Subjective/Objective:    S/p peripheral vascular intervention, already on plavix and asa.  NCM will cont to follow for dc needs.                Action/Plan:   Expected Discharge Date:                  Expected Discharge Plan:  Home/Self Care  In-House Referral:     Discharge planning Services  CM Consult  Post Acute Care Choice:    Choice offered to:     DME Arranged:    DME Agency:     HH Arranged:    HH Agency:     Status of Service:  In process, will continue to follow  If discussed at Long Length of Stay Meetings, dates discussed:    Additional Comments:  Zenon Mayo, RN 04/24/2016, 3:25 PM

## 2016-04-24 NOTE — Progress Notes (Signed)
Site area: right groin  Site Prior to Removal:  Level 0  Pressure Applied For 35 MINUTES    Minutes Beginning at 1415  Manual:   Yes.    Patient Status During Pull:  AAO X 4  Post Pull Groin Site:  Level 0  Post Pull Instructions Given:  Yes.    Post Pull Pulses Present:  Yes.    Dressing Applied:  Yes.    Comments: Extended pressure hold   due to oozing,  pt  tolerated procedure well. Post sheath pull instructions given

## 2016-04-25 ENCOUNTER — Other Ambulatory Visit: Payer: Self-pay | Admitting: Cardiology

## 2016-04-25 ENCOUNTER — Other Ambulatory Visit: Payer: Self-pay | Admitting: Cardiovascular Disease

## 2016-04-25 ENCOUNTER — Encounter (HOSPITAL_COMMUNITY): Payer: Self-pay | Admitting: Cardiovascular Disease

## 2016-04-25 DIAGNOSIS — I700 Atherosclerosis of aorta: Secondary | ICD-10-CM | POA: Diagnosis not present

## 2016-04-25 DIAGNOSIS — I739 Peripheral vascular disease, unspecified: Secondary | ICD-10-CM

## 2016-04-25 DIAGNOSIS — I2510 Atherosclerotic heart disease of native coronary artery without angina pectoris: Secondary | ICD-10-CM | POA: Diagnosis not present

## 2016-04-25 DIAGNOSIS — I70213 Atherosclerosis of native arteries of extremities with intermittent claudication, bilateral legs: Secondary | ICD-10-CM | POA: Diagnosis not present

## 2016-04-25 DIAGNOSIS — I6522 Occlusion and stenosis of left carotid artery: Secondary | ICD-10-CM | POA: Diagnosis not present

## 2016-04-25 DIAGNOSIS — E785 Hyperlipidemia, unspecified: Secondary | ICD-10-CM | POA: Diagnosis not present

## 2016-04-25 DIAGNOSIS — I10 Essential (primary) hypertension: Secondary | ICD-10-CM | POA: Diagnosis not present

## 2016-04-25 LAB — CBC
HEMATOCRIT: 37.7 % — AB (ref 39.0–52.0)
Hemoglobin: 12.4 g/dL — ABNORMAL LOW (ref 13.0–17.0)
MCH: 29.5 pg (ref 26.0–34.0)
MCHC: 32.9 g/dL (ref 30.0–36.0)
MCV: 89.5 fL (ref 78.0–100.0)
PLATELETS: 163 10*3/uL (ref 150–400)
RBC: 4.21 MIL/uL — ABNORMAL LOW (ref 4.22–5.81)
RDW: 12.2 % (ref 11.5–15.5)
WBC: 7.9 10*3/uL (ref 4.0–10.5)

## 2016-04-25 LAB — BASIC METABOLIC PANEL
Anion gap: 6 (ref 5–15)
BUN: 16 mg/dL (ref 6–20)
CHLORIDE: 110 mmol/L (ref 101–111)
CO2: 28 mmol/L (ref 22–32)
CREATININE: 0.82 mg/dL (ref 0.61–1.24)
Calcium: 9 mg/dL (ref 8.9–10.3)
GFR calc Af Amer: >60 mL/min (ref >60)
GLUCOSE: 100 mg/dL — AB (ref 65–99)
Potassium: 3.7 mmol/L (ref 3.5–5.1)
SODIUM: 144 mmol/L (ref 135–145)

## 2016-04-25 MED ORDER — PANTOPRAZOLE SODIUM 40 MG PO TBEC: 40.0000 mg | DELAYED_RELEASE_TABLET | Freq: Every day | ORAL | 12 refills | Status: DC

## 2016-04-25 NOTE — Care Management Note (Signed)
Case Management Note  Patient Details  Name: FENTON MONGELLI MRN: [id number] Date of Birth: [birth year]  Subjective/Objective:    S/p peripheral vascular intervention, already on plavix and asa for dc today, no needs.                Action/Plan:   Expected Discharge Date:                  Expected Discharge Plan:  Home/Self Care  In-House Referral:     Discharge planning Services  CM Consult  Post Acute Care Choice:    Choice offered to:     DME Arranged:    DME Agency:     HH Arranged:    HH Agency:     Status of Service:  Completed, signed off  If discussed at [employer] of Stay Meetings, dates discussed:    Additional Comments:  Zenon Mayo, RN 04/25/2016, 11:26 AM

## 2016-04-25 NOTE — Discharge Summary (Signed)
Discharge Summary    Patient ID: Nathan Santiago,  MRN: [id number], DOB/AGE: 1931-06-01 82 y.o.  Admit date: 04/24/2016 Discharge date: 04/25/2016  Primary Care Provider: [name] Primary Cardiologist: Dr. Gwenlyn Found  Discharge Diagnoses    Principal Problem:   Claudication [facility]) Active Problems:   Peripheral arterial disease ([place])   Allergies Allergies  Allergen Reactions  . Lisinopril Cough  . Niaspan [Niacin Er] Rash    Diagnostic Studies/Procedures   Lower Extremity Angiography  Peripheral Vascular Intervention 04/24/16  Procedures Performed:            1. Abdominal aortogram, bilateral iliac angiogram, bifemoral runoff            2. Contralateral access, selective angiogram left SFA and oblique view (second order catheter placement)            3. PTA and stent distal abdominal aorta using balloon-expandable stent Angiographic Data:   1: Abdominal aorta-renal arteries are widely patent. It was a 70% complex distal abdominal aortic stenosis just above the iliac bifurcation with a 60 mm pullback gradient after administration of intra-arterial nitroglycerin.  2: Left lower extremity-there was a 95% calcified eccentric exophytic plaque in the distal left common femoral artery just before the profunda takeoff. There was "lumpy bumpy" disease throughout the left SFA with two-vessel runoff. The posterior tibial is occluded 3: Right lower extremity-there was a 7580% mid right SFA stenosis with two-vessel runoff. The posterior tibial was occluded  IMPRESSION: Nathan Santiago has a high-grade hemodynamically significant distal abdominal aortic stenosis responsible for his bilateral lower extremity claudication and amenable to stenting. Will proceed with PT A and balloon-expandable stenting of his distal abdominal aorta. 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 He was formally a patient of [name]. Dr. Gwenlyn Found last saw him in the office 08/04/15. His cardiovascular status profile is remarkable for remote tobacco abuse,  hypertension and hyperlipidemia. He is not diabetic. He does [place] he [place] 5 days a week 25 minutes a day.   He has known cardiovascular disease status post stenting of his proximal mid and distal RCA using overlapping stents as well as the proximal AV groove circumflex with a known occluded OM branch which is old. He has normal LV function by 2-D echo performed 02/07/12 a nonischemic Myoview 2010.  He had carotid Dopplers performed 01/09/14 that showed a widely patent right carotid endarterectomy site with moderate left ICA stenosis remained stable. Since Dr. Gwenlyn Found saw him in January of this year he's noticed increasing bilateral lower extremity claudication which is lifestyle limiting. Dopplers have shown a decline in his ABIs bilaterally. He does have high frequency signal in his right greater than left iliac systems. He wishes to proceed with angiography and potential intervention.   Hospital Course  He presented for PV angiogram and intervention on 04/24/16. Full PV report above. He had successful stenting to his left SFA.   He was stable post procedure. Right groin site was stable without hematoma. He will continue on ASA, Plavix and Pletal.   He will continue on losartan and HCTZ Renal function is stable. He will have left lower extremity dopplers in one week and see Dr. Gwenlyn Found in 2-3 weeks.   He was seen today by Dr. Ellyn Hack and deemed suitable for discharge.  _____________  Discharge Vitals  Blood pressure (!) 141/71, pulse (!) 57, temperature 98 F (36.7 C), temperature source Oral, resp. rate 19, height 5\' 4"  (1.626 m), weight 166 lb 14.2 oz (75.7 kg), SpO2 91 %.  Filed Weights   04/24/16 0934 04/25/16 0608  Weight: 166 lb (75.3 kg) 166 lb 14.2 oz (75.7 kg)    GEN: Well nourished, well developed, in no acute distress.  HEENT: Grossly normal.  Neck: Supple, no JVD, carotid bruits, or masses. Cardiac: RRR, no murmurs, rubs, or gallops. No clubbing, cyanosis, edema.  Bilateral femoral bruits.  Respiratory:  Respirations regular and unlabored, clear to auscultation bilaterally. GI: Soft, nontender, nondistended, BS + x 4. MS: no deformity or atrophy. Skin: warm and dry, no rash. Neuro:  Strength and sensation are intact. Psych: AAOx3.  Normal affect.    Labs & Radiologic Studies     CBC  Recent Labs  04/25/16 0355  WBC 7.9  HGB 12.4*  HCT 37.7*  MCV 89.5  PLT [id number]   Basic Metabolic Panel  Recent Labs  04/25/16 0355  NA 144  K 3.7  CL 110  CO2 28  GLUCOSE 100*  BUN 16  CREATININE 0.82  CALCIUM 9.0   Dg Chest 2 View  Result Date: 04/12/2016 CLINICAL DATA:  Preop angiogram. EXAM: CHEST  2 VIEW COMPARISON:  10/28/2010 FINDINGS: The heart size and mediastinal contours are within normal limits. Both lungs are clear. Degenerative change of the spine. Calcified plaque over the thoracoabdominal aorta. IMPRESSION: No active cardiopulmonary disease. Aortic atherosclerosis. Electronically Signed   By: Marin Olp M.D.   On: 04/12/2016 15:46    Disposition   Pt is being discharged home today in good condition.  Follow-up Plans & Appointments    Follow-up Information    [facility] .   Specialty:  Cardiology Why:  Our office will call you to schedule an appt. for dopplers in one week.  Contact information: [address] [place]       [name], MD Follow up on 05/12/2016.   Specialties:  Cardiology, Radiology Why:  at 10:00 am for follow up  Contact information: [address] [place] [address] [phone]          Discharge Instructions    Diet - low sodium heart healthy    Complete by:  As directed    Increase  activity slowly    Complete by:  As directed       Discharge Medications   Current Discharge Medication List    START taking these medications   Details  pantoprazole (PROTONIX) 40 MG tablet Take 1 tablet (40 mg total) by mouth daily. Qty: 30 tablet, Refills: 12      CONTINUE these medications which have NOT CHANGED   Details  amLODipine (NORVASC) 5 MG tablet Take 5 mg by mouth daily.      Ascorbic Acid (VITAMIN C PO) Take 1 tablet by mouth.     aspirin 81 MG tablet Take 81 mg by mouth daily.      atenolol (TENORMIN) 50 MG tablet Take 50 mg by mouth daily.      atorvastatin (LIPITOR) 40 MG tablet Take 40 mg by mouth daily at 6 PM.     BIOTIN PO Take 1 tablet by mouth daily.     CALCIUM CITRATE PO Take 1 tablet by mouth daily.     Cholecalciferol (VITAMIN D-3 PO) Take 1,000 Units by mouth daily.  cilostazol (PLETAL) 100 MG tablet Take 100 mg by mouth 2 (two) times daily.      clopidogrel (PLAVIX) 75 MG tablet Take 75 mg by mouth daily.      fish oil-omega-3 fatty acids 1000 MG capsule Take 2 g by mouth daily.      GLUCOSAMINE PO Take 1,500 mg by mouth daily.     hydrochlorothiazide 25 MG tablet Take 25 mg by mouth daily.      losartan (COZAAR) 50 MG tablet Take 50 mg by mouth daily.    metoCLOPramide (REGLAN) 5 MG tablet Take 5 mg by mouth daily.      Multiple Vitamin (MULTIVITAMIN PO) Take by mouth.      potassium chloride SA (K-DUR,KLOR-CON) 20 MEQ tablet Take 20 mEq by mouth daily.     tamsulosin (FLOMAX) 0.4 MG CAPS capsule Take 0.8 mg by mouth daily.     benzonatate (TESSALON) 100 MG capsule Take 100 mg by mouth 3 (three) times daily as needed for cough.  Refills: 0    diazepam (VALIUM) 5 MG tablet Take 5 mg by mouth every 8 (eight) hours as needed for anxiety.      STOP taking these medications     omeprazole (PRILOSEC) 40 MG capsule          Outstanding Labs/Studies   Arterial dopplers in one week.   Duration of Discharge Encounter    Greater than 30 minutes including physician time.  Signed, Arbutus Leas NP 04/25/2016, 9:18 AM

## 2016-04-26 ENCOUNTER — Telehealth: Payer: Self-pay | Admitting: *Deleted

## 2016-04-26 NOTE — Telephone Encounter (Signed)
Forms completed and then signed by Dr Gwenlyn Found.   Patient presented in lobby to check on status of forms. Completed forms looked over by patient and nurse. Patient very thankful and doing well from recent procedure.

## 2016-04-26 NOTE — Telephone Encounter (Signed)
Patient came into office to drop off form for travel insurance. Patient had to cancel an upcoming trip r/t his pv procedure and travel insurance needs Dr Gwenlyn Found to have filled out.

## 2016-05-02 ENCOUNTER — Ambulatory Visit (HOSPITAL_COMMUNITY)
Admission: RE | Admit: 2016-05-02 | Discharge: 2016-05-02 | Disposition: A | Discharge status: Home / Self Care | Payer: Medicare Other | Source: Ambulatory Visit | Attending: Cardiology | Admitting: Cardiology

## 2016-05-02 DIAGNOSIS — I2510 Atherosclerotic heart disease of native coronary artery without angina pectoris: Secondary | ICD-10-CM | POA: Diagnosis not present

## 2016-05-02 DIAGNOSIS — Z87891 Personal history of nicotine dependence: Secondary | ICD-10-CM | POA: Insufficient documentation

## 2016-05-02 DIAGNOSIS — Z95820 Peripheral vascular angioplasty status with implants and grafts: Secondary | ICD-10-CM | POA: Diagnosis not present

## 2016-05-02 DIAGNOSIS — E785 Hyperlipidemia, unspecified: Secondary | ICD-10-CM | POA: Diagnosis not present

## 2016-05-02 DIAGNOSIS — I10 Essential (primary) hypertension: Secondary | ICD-10-CM | POA: Diagnosis not present

## 2016-05-02 DIAGNOSIS — I739 Peripheral vascular disease, unspecified: Secondary | ICD-10-CM | POA: Insufficient documentation

## 2016-05-03 ENCOUNTER — Telehealth: Payer: Self-pay | Admitting: *Deleted

## 2016-05-03 DIAGNOSIS — I739 Peripheral vascular disease, unspecified: Secondary | ICD-10-CM

## 2016-05-03 DIAGNOSIS — I10 Essential (primary) hypertension: Secondary | ICD-10-CM

## 2016-05-03 NOTE — Telephone Encounter (Signed)
-----   Message from Lorretta Harp, MD sent at 05/03/2016  9:00 AM EDT ----- Improved ABIs s/p distal abd Ao stenting. Repeat 6 months

## 2016-05-03 NOTE — Telephone Encounter (Signed)
Notes Recorded by Vanessa Ralphs, RN on 05/03/2016 at 1:38 PM EDT No answer. Left detailed message with results, ok per DPR, and to call back if he has any questions. Repeat order entered.

## 2016-05-03 NOTE — Telephone Encounter (Signed)
Erroneous encounter

## 2016-05-12 ENCOUNTER — Ambulatory Visit: Payer: Medicare Other | Admitting: Cardiovascular Disease

## 2016-05-17 ENCOUNTER — Ambulatory Visit: Payer: Medicare Other | Admitting: Cardiovascular Disease

## 2016-05-23 ENCOUNTER — Ambulatory Visit (INDEPENDENT_AMBULATORY_CARE_PROVIDER_SITE_OTHER): Payer: Medicare Other | Admitting: Cardiovascular Disease

## 2016-05-23 ENCOUNTER — Encounter: Payer: Self-pay | Admitting: Cardiovascular Disease

## 2016-05-23 VITALS — BP 107/48 | HR 65 | Ht 65.0 in | Wt 171.0 lb

## 2016-05-23 DIAGNOSIS — I739 Peripheral vascular disease, unspecified: Secondary | ICD-10-CM

## 2016-05-23 DIAGNOSIS — I6523 Occlusion and stenosis of bilateral carotid arteries: Secondary | ICD-10-CM

## 2016-05-23 NOTE — Patient Instructions (Addendum)
Medication Instructions:  Your physician has recommended you make the following change in your medication:  1- STOP TAKING your Pletal.    Testing/Procedures: Your physician has requested that you have a aorta and iliac duplex. During this test, an ultrasound is used to evaluate blood flow to the aorta and iliac arteries. Allow one hour for this exam. Do not eat after midnight the day before and avoid carbonated beverages.  IN 6 MONTHS    Follow-Up: Your physician wants you to follow-up in: [place].  You will receive a reminder letter in the mail two months in advance. If you don't receive a letter, please call our office to schedule the follow-up appointment.   If you need a refill on your cardiac medications before your next appointment, please call your pharmacy.

## 2016-05-23 NOTE — Addendum Note (Signed)
Addended by: Vanessa Ralphs on: 05/23/2016 11:54 AM   Modules accepted: Orders

## 2016-05-23 NOTE — Progress Notes (Signed)
05/23/2016 [name]   May 21, 1931  [id number]  Primary Physician [name], MD Primary Cardiologist: [name] [name]  HPI:  Mr. 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 He was formally a patient of [name]. I last saw him in the office 03/22/16.[name] His cardiovascular status profile is remarkable for remote tobacco abuse, 2 hypertension and hyperlipidemia. He is not diabetic. He does exercise [place] a [place] where he walks 5 days a week 25 minutes a day. He hasn't known cardiovascular disease status post stenting of his proximal mid and distal RCA using overlapping stents as well as the proximal AV groove circumflex with a known occluded OM branch which is old. He has normal LV function by 2-D echo performed 02/07/12 a nonischemic Myoview 2010. He denies chest pain, shortness of breath or claudication. He had carotid Dopplers performed 01/09/14 that showed a widely patent right carotid endarterectomy site with moderate left ICA stenosis remained stable. Since I saw him in January of this year he's noticed increasing bilateral lower extremity claudication which is lifestyle limiting. Dopplers have shown a decline in his ABIs bilaterally. He does have high frequency signal in his right greater than left iliac systems. I performed peripheral angiography on him 04/24/16 revealing a 75% distal abdominal aortic stenosis with a 60 mm pullback gradient. He also had a 95% calcified distal left common femoral artery stenosis and an 80% segmental mid right SFA stenosis with 2 vessel runoff bilaterally. I stented his distal abdominal aorta with a 10 x 29 mm long balloon expandable stent. His ABIs and increased from 0.7 bilaterally up to 1 on the right and 0.8 on the left. His claudication completely resolved. He was able to go on a trip to the [facility] and  ambulated without limitation.  Current Outpatient Prescriptions  Medication Sig Dispense Refill  . amLODipine (NORVASC) 5 MG tablet Take 5 mg by mouth daily.      . Ascorbic Acid (VITAMIN C PO) Take 1 tablet by mouth.     [name] aspirin 81 MG tablet Take 81 mg by mouth daily.      [name] atenolol (TENORMIN) 50 MG tablet Take 50 mg by mouth daily.      [name] atorvastatin (LIPITOR) 40 MG tablet Take 40 mg by mouth daily at 6 PM.     . benzonatate (TESSALON) 100 MG capsule Take 100 mg by mouth 3 (three) times daily as needed for cough.   0  . BIOTIN PO Take 1 tablet by mouth daily.     [name] CALCIUM CITRATE PO Take 1 tablet by mouth daily.     . Cholecalciferol (VITAMIN D-3 PO) Take 1,000 Units by mouth daily.     . cilostazol (PLETAL) 100 MG tablet Take 100 mg by mouth 2 (two) times daily.      . clopidogrel (PLAVIX) 75 MG tablet Take 75 mg by mouth daily.      . diazepam (VALIUM) 5 MG tablet Take 5 mg by mouth every 8 (eight) hours as needed for anxiety.    . fish oil-omega-3 fatty acids 1000 MG capsule Take 2 g by mouth daily.      [name] GLUCOSAMINE PO Take 1,500 mg by mouth daily.     . hydrochlorothiazide 25 MG tablet Take 25 mg by mouth daily.      [name] losartan (COZAAR) 50 MG  tablet Take 50 mg by mouth daily.    . metoCLOPramide (REGLAN) 5 MG tablet Take 5 mg by mouth daily.      . Multiple Vitamin (MULTIVITAMIN PO) Take by mouth.      . pantoprazole (PROTONIX) 40 MG tablet Take 1 tablet (40 mg total) by mouth daily. 30 tablet 12  . potassium chloride SA (K-DUR,KLOR-CON) 20 MEQ tablet Take 20 mEq by mouth daily.     . tamsulosin (FLOMAX) 0.4 MG CAPS capsule Take 0.8 mg by mouth daily.      No current facility-administered medications for this visit.     Allergies  Allergen Reactions  . Lisinopril Cough  . Niaspan [Niacin Er] Rash    Social History   Social History  . Marital status: Married    Spouse name: N/A  . Number of children: N/A  . Years of education: N/A   Occupational History  . Not  on file.   Social History Main Topics  . Smoking status: Former Smoker    Quit date: 07/31/1968  . Smokeless tobacco: Never Used  . Alcohol use 2.4 oz/week    4 Glasses of wine per week     Comment: OCASIONAL  . Drug use: No  . Sexual activity: Not on file   Other Topics Concern  . Not on file   Social History Narrative  . No narrative on file     Review of Systems: General: negative for chills, fever, night sweats or weight changes.  Cardiovascular: negative for chest pain, dyspnea on exertion, edema, orthopnea, palpitations, paroxysmal nocturnal dyspnea or shortness of breath Dermatological: negative for rash Respiratory: negative for cough or wheezing Urologic: negative for hematuria Abdominal: negative for nausea, vomiting, diarrhea, bright red blood per rectum, melena, or hematemesis Neurologic: negative for visual changes, syncope, or dizziness All other systems reviewed and are otherwise negative except as noted above.    Blood pressure (!) 107/48, pulse 65, height 5\' 5"  (1.651 m), weight 171 lb (77.6 kg).  General appearance: alert and no distress Neck: no adenopathy, no carotid bruit, no JVD, supple, symmetrical, trachea midline and thyroid not enlarged, symmetric, no tenderness/mass/nodules Lungs: clear to auscultation bilaterally Heart: regular rate and rhythm, S1, S2 normal, no murmur, click, rub or gallop Extremities: extremities normal, atraumatic, no cyanosis or edema  EKG not performed today  ASSESSMENT AND PLAN:   Peripheral arterial disease ([place]) History of peripheral arterial disease status post distal abdominal aortic stenting by myself 04/24/16 with a 10 mm x 29 mm long balloon expandable stent in the distal aorta. His aortoiliac Doppler's normalized and his ABIs improved from the 0.7 range up to 0.8 and 1. He no longer has claudication. He is on dual antiplatelet therapy. We'll continue to follow him noninvasively.      [name]  FACP,FACC,FAHA, [facility] 05/23/2016 11:45 AM

## 2016-05-23 NOTE — Assessment & Plan Note (Signed)
History of peripheral arterial disease status post distal abdominal aortic stenting by myself 04/24/16 with a 10 mm x 29 mm long balloon expandable stent in the distal aorta. His aortoiliac Doppler's normalized and his ABIs improved from the 0.7 range up to 0.8 and 1. He no longer has claudication. He is on dual antiplatelet therapy. We'll continue to follow him noninvasively.

## 2016-07-25 DIAGNOSIS — L820 Inflamed seborrheic keratosis: Secondary | ICD-10-CM | POA: Diagnosis not present

## 2016-07-25 DIAGNOSIS — D485 Neoplasm of uncertain behavior of skin: Secondary | ICD-10-CM | POA: Diagnosis not present

## 2016-07-25 DIAGNOSIS — L570 Actinic keratosis: Secondary | ICD-10-CM | POA: Diagnosis not present

## 2016-07-25 DIAGNOSIS — Z85828 Personal history of other malignant neoplasm of skin: Secondary | ICD-10-CM | POA: Diagnosis not present

## 2016-08-15 ENCOUNTER — Telehealth: Payer: Self-pay | Admitting: Cardiovascular Disease

## 2016-08-15 DIAGNOSIS — N200 Calculus of kidney: Secondary | ICD-10-CM | POA: Diagnosis not present

## 2016-08-15 DIAGNOSIS — N401 Enlarged prostate with lower urinary tract symptoms: Secondary | ICD-10-CM | POA: Diagnosis not present

## 2016-08-15 DIAGNOSIS — R3915 Urgency of urination: Secondary | ICD-10-CM | POA: Diagnosis not present

## 2016-08-15 NOTE — Telephone Encounter (Signed)
Okay to interrupt antiplatelet therapy for TURP.

## 2016-08-15 NOTE — Telephone Encounter (Signed)
Request for surgical clearance:  What type of surgery is being performed?Turp  1. When is this surgery scheduled? Pending Clearance   2. Are there any medications that need to be held prior to surgery and how long?Plavix and Aspirin    3. Name of physician performing surgery? Dr.Herrick    4. What is your office phone and fax number? Office [phone] fax# [phone]

## 2016-08-18 NOTE — Telephone Encounter (Signed)
Efaxed to requesting provider.  

## 2016-08-22 DIAGNOSIS — H3500 Unspecified background retinopathy: Secondary | ICD-10-CM | POA: Diagnosis not present

## 2016-08-25 ENCOUNTER — Other Ambulatory Visit: Payer: Self-pay | Admitting: Urology

## 2016-09-04 NOTE — Progress Notes (Signed)
LOV cardio 05-23-16 Dr Gwenlyn Found epic Note from Dr Gwenlyn Found 08-15-16, phone encounter, aware of surgery ; okays Plavix interruption for TURP in epic CXR 04-12-16 epic EKG 04-24-16 epic

## 2016-09-04 NOTE — Patient Instructions (Addendum)
[facility]  09/04/2016   Your procedure is scheduled on: 09-08-16 Friday  Report to [facility]  Entrance take [facility]  elevators to 3rd floor to  [place] at 1045  AM.  Call this number if you have problems the morning of surgery [phone]   Remember: ONLY 1 PERSON MAY GO WITH YOU TO SHORT STAY TO GET  READY MORNING OF [place].    Do not eat food or liquids  After Midnight.     Take these medicines the morning of surgery with A SIP OF WATER: amlodipine(norvasc), atenolol(tenormin),  Protonix(pantroprazole), tamsulosin(flomax), eye drops as needed                                 You may not have any metal on your body including hair pins and              piercings  Do not wear jewelry, make-up, lotions, powders or perfumes, deodorant             Do not wear nail polish.  Do not shave  48 hours prior to surgery.              Men may shave face and neck.   Do not bring valuables to the hospital. [place].  Contacts, dentures or bridgework may not be worn into surgery.  Leave suitcase in the car. After surgery it may be brought to your room.              Please read over the following fact sheets you were given: _____________________________________________________________________   [facility] - Preparing for Surgery Before surgery, you can play an important role.  Because skin is not sterile, your skin needs to be as free of germs as possible.  You can reduce the number of germs on your skin by washing with CHG (chlorahexidine gluconate) soap before surgery.  CHG is an antiseptic cleaner which kills germs and bonds with the skin to continue killing germs even after washing. Please DO NOT use if you have an allergy to CHG or antibacterial soaps.  If your skin becomes reddened/irritated stop using the CHG and inform your nurse when you arrive at Short Stay. Do not shave (including legs and  underarms) for at least 48 hours prior to the first CHG shower.  You may shave your face/neck. Please follow these instructions carefully:  1.  Shower with CHG Soap the night before surgery and the  morning of Surgery.  2.  If you choose to wash your hair, wash your hair first as usual with your  normal  shampoo.  3.  After you shampoo, rinse your hair and body thoroughly to remove the  shampoo.                           4.  Use CHG as you would any other liquid soap.  You can apply chg directly  to the skin and wash                       Gently with a scrungie or clean washcloth.  5.  Apply the CHG Soap to  your body ONLY FROM THE NECK DOWN.   Do not use on face/ open                           Wound or open sores. Avoid contact with eyes, ears mouth and genitals (private parts).                       Wash face,  Genitals (private parts) with your normal soap.             6.  Wash thoroughly, paying special attention to the area where your surgery  will be performed.  7.  Thoroughly rinse your body with warm water from the neck down.  8.  DO NOT shower/wash with your normal soap after using and rinsing off  the CHG Soap.                9.  Pat yourself dry with a clean towel.            10.  Wear clean pajamas.            11.  Place clean sheets on your bed the night of your first shower and do not  sleep with pets. Day of Surgery : Do not apply any lotions/deodorants the morning of surgery.  Please wear clean clothes to the hospital/surgery center.  FAILURE TO FOLLOW THESE INSTRUCTIONS MAY RESULT IN THE CANCELLATION OF YOUR SURGERY PATIENT SIGNATURE_________________________________  NURSE SIGNATURE__________________________________  ________________________________________________________________________

## 2016-09-05 DIAGNOSIS — R739 Hyperglycemia, unspecified: Secondary | ICD-10-CM | POA: Diagnosis not present

## 2016-09-05 DIAGNOSIS — I10 Essential (primary) hypertension: Secondary | ICD-10-CM | POA: Diagnosis not present

## 2016-09-05 DIAGNOSIS — Z125 Encounter for screening for malignant neoplasm of prostate: Secondary | ICD-10-CM | POA: Diagnosis not present

## 2016-09-05 DIAGNOSIS — E784 Other hyperlipidemia: Secondary | ICD-10-CM | POA: Diagnosis not present

## 2016-09-06 ENCOUNTER — Encounter (HOSPITAL_COMMUNITY): Payer: Self-pay

## 2016-09-06 ENCOUNTER — Encounter (HOSPITAL_COMMUNITY)
Admission: RE | Admit: 2016-09-06 | Discharge: 2016-09-06 | Disposition: A | Discharge status: Home / Self Care | Payer: Medicare Other | Source: Ambulatory Visit | Attending: Urology | Admitting: Urology

## 2016-09-06 DIAGNOSIS — N401 Enlarged prostate with lower urinary tract symptoms: Secondary | ICD-10-CM | POA: Diagnosis not present

## 2016-09-06 DIAGNOSIS — R351 Nocturia: Secondary | ICD-10-CM | POA: Diagnosis not present

## 2016-09-06 DIAGNOSIS — Z79899 Other long term (current) drug therapy: Secondary | ICD-10-CM | POA: Diagnosis not present

## 2016-09-06 DIAGNOSIS — Z888 Allergy status to other drugs, medicaments and biological substances status: Secondary | ICD-10-CM | POA: Diagnosis not present

## 2016-09-06 DIAGNOSIS — N320 Bladder-neck obstruction: Secondary | ICD-10-CM | POA: Diagnosis not present

## 2016-09-06 DIAGNOSIS — R338 Other retention of urine: Secondary | ICD-10-CM | POA: Diagnosis not present

## 2016-09-06 DIAGNOSIS — C61 Malignant neoplasm of prostate: Secondary | ICD-10-CM | POA: Diagnosis not present

## 2016-09-06 DIAGNOSIS — K219 Gastro-esophageal reflux disease without esophagitis: Secondary | ICD-10-CM | POA: Diagnosis not present

## 2016-09-06 DIAGNOSIS — Z7902 Long term (current) use of antithrombotics/antiplatelets: Secondary | ICD-10-CM | POA: Diagnosis not present

## 2016-09-06 DIAGNOSIS — Z87442 Personal history of urinary calculi: Secondary | ICD-10-CM | POA: Diagnosis not present

## 2016-09-06 DIAGNOSIS — N3941 Urge incontinence: Secondary | ICD-10-CM | POA: Diagnosis not present

## 2016-09-06 DIAGNOSIS — I10 Essential (primary) hypertension: Secondary | ICD-10-CM | POA: Diagnosis not present

## 2016-09-06 DIAGNOSIS — R350 Frequency of micturition: Secondary | ICD-10-CM | POA: Diagnosis not present

## 2016-09-06 DIAGNOSIS — Z7982 Long term (current) use of aspirin: Secondary | ICD-10-CM | POA: Diagnosis not present

## 2016-09-06 DIAGNOSIS — I2510 Atherosclerotic heart disease of native coronary artery without angina pectoris: Secondary | ICD-10-CM | POA: Diagnosis not present

## 2016-09-06 LAB — CBC
HEMATOCRIT: 36.9 % — AB (ref 39.0–52.0)
HEMOGLOBIN: 12.5 g/dL — AB (ref 13.0–17.0)
MCH: 29.8 pg (ref 26.0–34.0)
MCHC: 33.9 g/dL (ref 30.0–36.0)
MCV: 88.1 fL (ref 78.0–100.0)
Platelets: 192 10*3/uL (ref 150–400)
RBC: 4.19 MIL/uL — AB (ref 4.22–5.81)
RDW: 12.2 % (ref 11.5–15.5)
WBC: 6.9 10*3/uL (ref 4.0–10.5)

## 2016-09-06 LAB — BASIC METABOLIC PANEL
ANION GAP: 6 (ref 5–15)
BUN: 22 mg/dL — ABNORMAL HIGH (ref 6–20)
CHLORIDE: 105 mmol/L (ref 101–111)
CO2: 28 mmol/L (ref 22–32)
Calcium: 9.3 mg/dL (ref 8.9–10.3)
Creatinine, Ser: 0.96 mg/dL (ref 0.61–1.24)
GFR calc non Af Amer: >60 mL/min (ref >60)
GLUCOSE: 102 mg/dL — AB (ref 65–99)
POTASSIUM: 4.4 mmol/L (ref 3.5–5.1)
Sodium: 139 mmol/L (ref 135–145)

## 2016-09-08 ENCOUNTER — Ambulatory Visit (HOSPITAL_COMMUNITY): Payer: Medicare Other | Admitting: Anesthesiology

## 2016-09-08 ENCOUNTER — Encounter (HOSPITAL_COMMUNITY)
Admission: RE | Disposition: A | Discharge status: Home / Self Care | Payer: Self-pay | Source: Ambulatory Visit | Attending: Urology

## 2016-09-08 ENCOUNTER — Encounter (HOSPITAL_COMMUNITY): Payer: Self-pay

## 2016-09-08 ENCOUNTER — Ambulatory Visit (HOSPITAL_COMMUNITY)
Admission: RE | Admit: 2016-09-08 | Discharge: 2016-09-10 | Disposition: A | Discharge status: Home / Self Care | Payer: Medicare Other | Source: Ambulatory Visit | Attending: Urology | Admitting: Urology

## 2016-09-08 DIAGNOSIS — K219 Gastro-esophageal reflux disease without esophagitis: Secondary | ICD-10-CM | POA: Insufficient documentation

## 2016-09-08 DIAGNOSIS — R338 Other retention of urine: Secondary | ICD-10-CM | POA: Diagnosis not present

## 2016-09-08 DIAGNOSIS — R350 Frequency of micturition: Secondary | ICD-10-CM | POA: Diagnosis not present

## 2016-09-08 DIAGNOSIS — N320 Bladder-neck obstruction: Secondary | ICD-10-CM | POA: Diagnosis not present

## 2016-09-08 DIAGNOSIS — Z888 Allergy status to other drugs, medicaments and biological substances status: Secondary | ICD-10-CM | POA: Insufficient documentation

## 2016-09-08 DIAGNOSIS — Z87442 Personal history of urinary calculi: Secondary | ICD-10-CM | POA: Insufficient documentation

## 2016-09-08 DIAGNOSIS — N138 Other obstructive and reflux uropathy: Secondary | ICD-10-CM

## 2016-09-08 DIAGNOSIS — N401 Enlarged prostate with lower urinary tract symptoms: Secondary | ICD-10-CM | POA: Insufficient documentation

## 2016-09-08 DIAGNOSIS — C61 Malignant neoplasm of prostate: Secondary | ICD-10-CM | POA: Insufficient documentation

## 2016-09-08 DIAGNOSIS — R351 Nocturia: Secondary | ICD-10-CM | POA: Insufficient documentation

## 2016-09-08 DIAGNOSIS — Z7902 Long term (current) use of antithrombotics/antiplatelets: Secondary | ICD-10-CM | POA: Diagnosis not present

## 2016-09-08 DIAGNOSIS — I2510 Atherosclerotic heart disease of native coronary artery without angina pectoris: Secondary | ICD-10-CM | POA: Insufficient documentation

## 2016-09-08 DIAGNOSIS — Z79899 Other long term (current) drug therapy: Secondary | ICD-10-CM | POA: Insufficient documentation

## 2016-09-08 DIAGNOSIS — N3941 Urge incontinence: Secondary | ICD-10-CM | POA: Insufficient documentation

## 2016-09-08 DIAGNOSIS — E7800 Pure hypercholesterolemia, unspecified: Secondary | ICD-10-CM | POA: Diagnosis not present

## 2016-09-08 DIAGNOSIS — Z7982 Long term (current) use of aspirin: Secondary | ICD-10-CM | POA: Diagnosis not present

## 2016-09-08 DIAGNOSIS — I10 Essential (primary) hypertension: Secondary | ICD-10-CM | POA: Diagnosis not present

## 2016-09-08 HISTORY — PX: TRANSURETHRAL RESECTION OF PROSTATE: SHX73

## 2016-09-08 LAB — BASIC METABOLIC PANEL
Anion gap: 5 (ref 5–15)
BUN: 17 mg/dL (ref 6–20)
CHLORIDE: 110 mmol/L (ref 101–111)
CO2: 27 mmol/L (ref 22–32)
Calcium: 8.3 mg/dL — ABNORMAL LOW (ref 8.9–10.3)
Creatinine, Ser: 0.9 mg/dL (ref 0.61–1.24)
GFR calc Af Amer: >60 mL/min (ref >60)
GFR calc non Af Amer: >60 mL/min (ref >60)
GLUCOSE: 101 mg/dL — AB (ref 65–99)
POTASSIUM: 3.9 mmol/L (ref 3.5–5.1)
SODIUM: 142 mmol/L (ref 135–145)

## 2016-09-08 LAB — CBC
HCT: 37.1 % — ABNORMAL LOW (ref 39.0–52.0)
HEMOGLOBIN: 12.4 g/dL — AB (ref 13.0–17.0)
MCH: 29.6 pg (ref 26.0–34.0)
MCHC: 33.4 g/dL (ref 30.0–36.0)
MCV: 88.5 fL (ref 78.0–100.0)
Platelets: 185 10*3/uL (ref 150–400)
RBC: 4.19 MIL/uL — AB (ref 4.22–5.81)
RDW: 12.2 % (ref 11.5–15.5)
WBC: 8.1 10*3/uL (ref 4.0–10.5)

## 2016-09-08 SURGERY — TURP (TRANSURETHRAL RESECTION OF PROSTATE)
Anesthesia: General | Site: Prostate

## 2016-09-08 MED ORDER — MORPHINE SULFATE (PF) 10 MG/ML IV SOLN: 2.0000 mg | INTRAVENOUS | Status: DC | PRN

## 2016-09-08 MED ORDER — DEXAMETHASONE SODIUM PHOSPHATE 10 MG/ML IJ SOLN
INTRAMUSCULAR | Status: DC | PRN
Start: 2016-09-08 — End: 2016-09-08
  Administered 2016-09-08: 10 mg via INTRAVENOUS

## 2016-09-08 MED ORDER — CARBOXYMETHYLCELLUL-GLYCERIN 0.5-0.9 % OP SOLN: Freq: Every day | OPHTHALMIC | Status: DC | PRN

## 2016-09-08 MED ORDER — TRAMADOL HCL 50 MG PO TABS: 50.0000 mg | ORAL_TABLET | Freq: Four times a day (QID) | ORAL | 0 refills | Status: DC | PRN

## 2016-09-08 MED ORDER — POLYVINYL ALCOHOL 1.4 % OP SOLN
1.0000 [drp] | Freq: Every day | OPHTHALMIC | Status: DC | PRN
  Filled 2016-09-08: qty 15

## 2016-09-08 MED ORDER — BACITRACIN-NEOMYCIN-POLYMYXIN 400-5-5000 EX OINT
1.0000 | TOPICAL_OINTMENT | Freq: Three times a day (TID) | CUTANEOUS | Status: DC | PRN
Start: 2016-09-08 — End: 2016-09-10

## 2016-09-08 MED ORDER — SODIUM CHLORIDE 0.9 % IR SOLN: 3000.0000 mL | Status: DC

## 2016-09-08 MED ORDER — PROPOFOL 10 MG/ML IV BOLUS
INTRAVENOUS | Status: DC | PRN
  Administered 2016-09-08: 150 mg via INTRAVENOUS

## 2016-09-08 MED ORDER — FENTANYL CITRATE (PF) 100 MCG/2ML IJ SOLN
INTRAMUSCULAR | Status: AC
  Filled 2016-09-08: qty 2

## 2016-09-08 MED ORDER — SODIUM CHLORIDE 0.9% FLUSH
3.0000 mL | Freq: Two times a day (BID) | INTRAVENOUS | Status: DC
  Administered 2016-09-09: 3 mL via INTRAVENOUS

## 2016-09-08 MED ORDER — PROPOFOL 10 MG/ML IV BOLUS
INTRAVENOUS | Status: AC
  Filled 2016-09-08: qty 20

## 2016-09-08 MED ORDER — LACTATED RINGERS IV SOLN
INTRAVENOUS | Status: DC
  Administered 2016-09-08 (×2): via INTRAVENOUS

## 2016-09-08 MED ORDER — POTASSIUM CHLORIDE CRYS ER 20 MEQ PO TBCR
20.0000 meq | EXTENDED_RELEASE_TABLET | Freq: Every day | ORAL | Status: DC
  Administered 2016-09-08 – 2016-09-10 (×3): 20 meq via ORAL
  Filled 2016-09-08 (×3): qty 1

## 2016-09-08 MED ORDER — CIPROFLOXACIN IN D5W 400 MG/200ML IV SOLN
400.0000 mg | INTRAVENOUS | Status: AC
  Administered 2016-09-08: 400 mg via INTRAVENOUS

## 2016-09-08 MED ORDER — 0.9 % SODIUM CHLORIDE (POUR BTL) OPTIME
TOPICAL | Status: DC | PRN
  Administered 2016-09-08: 1000 mL

## 2016-09-08 MED ORDER — ONDANSETRON HCL 4 MG/2ML IJ SOLN
INTRAMUSCULAR | Status: DC | PRN
Start: 2016-09-08 — End: 2016-09-08
  Administered 2016-09-08: 4 mg via INTRAVENOUS

## 2016-09-08 MED ORDER — MORPHINE SULFATE (PF) 4 MG/ML IV SOLN: 2.0000 mg | INTRAVENOUS | Status: DC | PRN

## 2016-09-08 MED ORDER — STERILE WATER FOR IRRIGATION IR SOLN
Status: DC | PRN
  Administered 2016-09-08: 500 mL

## 2016-09-08 MED ORDER — POTASSIUM CHLORIDE IN NACL 20-0.45 MEQ/L-% IV SOLN
INTRAVENOUS | Status: DC
  Filled 2016-09-08: qty 1000

## 2016-09-08 MED ORDER — ONDANSETRON HCL 4 MG/2ML IJ SOLN
INTRAMUSCULAR | Status: AC
  Filled 2016-09-08: qty 2

## 2016-09-08 MED ORDER — TRAMADOL HCL 50 MG PO TABS
50.0000 mg | ORAL_TABLET | Freq: Four times a day (QID) | ORAL | Status: DC | PRN
  Administered 2016-09-08: 100 mg via ORAL
  Administered 2016-09-08: 50 mg via ORAL
  Filled 2016-09-08: qty 2
  Filled 2016-09-08: qty 1

## 2016-09-08 MED ORDER — LIDOCAINE 2% (20 MG/ML) 5 ML SYRINGE
INTRAMUSCULAR | Status: AC
  Filled 2016-09-08: qty 5

## 2016-09-08 MED ORDER — HYDROCHLOROTHIAZIDE 25 MG PO TABS
25.0000 mg | ORAL_TABLET | Freq: Every day | ORAL | Status: DC
  Administered 2016-09-08 – 2016-09-10 (×3): 25 mg via ORAL
  Filled 2016-09-08 (×3): qty 1

## 2016-09-08 MED ORDER — AMLODIPINE BESYLATE 5 MG PO TABS
5.0000 mg | ORAL_TABLET | Freq: Every day | ORAL | Status: DC
  Administered 2016-09-08 – 2016-09-10 (×3): 5 mg via ORAL
  Filled 2016-09-08 (×3): qty 1

## 2016-09-08 MED ORDER — ZOLPIDEM TARTRATE 5 MG PO TABS: 5.0000 mg | ORAL_TABLET | Freq: Every evening | ORAL | Status: DC | PRN

## 2016-09-08 MED ORDER — LOSARTAN POTASSIUM 50 MG PO TABS
50.0000 mg | ORAL_TABLET | Freq: Every day | ORAL | Status: DC
  Administered 2016-09-08 – 2016-09-10 (×3): 50 mg via ORAL
  Filled 2016-09-08 (×3): qty 1

## 2016-09-08 MED ORDER — SODIUM CHLORIDE 0.45 % IV SOLN
INTRAVENOUS | Status: DC
  Administered 2016-09-08 – 2016-09-09 (×2): via INTRAVENOUS
  Filled 2016-09-08 (×7): qty 1000

## 2016-09-08 MED ORDER — BELLADONNA ALKALOIDS-OPIUM 16.2-60 MG RE SUPP
RECTAL | Status: AC
  Filled 2016-09-08: qty 1

## 2016-09-08 MED ORDER — FENTANYL CITRATE (PF) 100 MCG/2ML IJ SOLN
INTRAMUSCULAR | Status: DC | PRN
  Administered 2016-09-08 (×2): 50 ug via INTRAVENOUS

## 2016-09-08 MED ORDER — DIAZEPAM 5 MG PO TABS
5.0000 mg | ORAL_TABLET | Freq: Three times a day (TID) | ORAL | Status: DC | PRN
  Administered 2016-09-08 – 2016-09-09 (×2): 5 mg via ORAL
  Filled 2016-09-08 (×2): qty 1

## 2016-09-08 MED ORDER — BELLADONNA ALKALOIDS-OPIUM 16.2-60 MG RE SUPP
RECTAL | Status: DC | PRN
  Administered 2016-09-08: 1 via RECTAL

## 2016-09-08 MED ORDER — PANTOPRAZOLE SODIUM 40 MG PO TBEC
40.0000 mg | DELAYED_RELEASE_TABLET | Freq: Every day | ORAL | Status: DC
  Administered 2016-09-08 – 2016-09-10 (×3): 40 mg via ORAL
  Filled 2016-09-08 (×3): qty 1

## 2016-09-08 MED ORDER — LIDOCAINE 2% (20 MG/ML) 5 ML SYRINGE
INTRAMUSCULAR | Status: DC | PRN
  Administered 2016-09-08: 100 mg via INTRAVENOUS

## 2016-09-08 MED ORDER — ATENOLOL 50 MG PO TABS
50.0000 mg | ORAL_TABLET | Freq: Every day | ORAL | Status: DC
  Administered 2016-09-09 – 2016-09-10 (×2): 50 mg via ORAL
  Filled 2016-09-08 (×3): qty 1

## 2016-09-08 MED ORDER — SODIUM CHLORIDE 0.9 % IV SOLN: 250.0000 mL | INTRAVENOUS | Status: DC | PRN

## 2016-09-08 MED ORDER — SODIUM CHLORIDE 0.9 % IR SOLN
Status: DC | PRN
  Administered 2016-09-08: 21000 mL

## 2016-09-08 MED ORDER — CIPROFLOXACIN IN D5W 400 MG/200ML IV SOLN
INTRAVENOUS | Status: AC
  Filled 2016-09-08: qty 200

## 2016-09-08 MED ORDER — ATORVASTATIN CALCIUM 10 MG PO TABS
20.0000 mg | ORAL_TABLET | Freq: Every evening | ORAL | Status: DC
  Administered 2016-09-08 – 2016-09-09 (×2): 20 mg via ORAL
  Filled 2016-09-08 (×2): qty 2

## 2016-09-08 MED ORDER — SODIUM CHLORIDE 0.9% FLUSH: 3.0000 mL | INTRAVENOUS | Status: DC | PRN

## 2016-09-08 MED ORDER — ONDANSETRON HCL 4 MG/2ML IJ SOLN: 4.0000 mg | INTRAMUSCULAR | Status: DC | PRN

## 2016-09-08 MED ORDER — MELATONIN 5 MG PO CAPS: 5.0000 mg | ORAL_CAPSULE | Freq: Every evening | ORAL | Status: DC | PRN

## 2016-09-08 MED ORDER — ACETAMINOPHEN 500 MG PO TABS: 1000.0000 mg | ORAL_TABLET | Freq: Four times a day (QID) | ORAL | Status: DC | PRN

## 2016-09-08 MED ORDER — DEXAMETHASONE SODIUM PHOSPHATE 10 MG/ML IJ SOLN
INTRAMUSCULAR | Status: AC
  Filled 2016-09-08: qty 1

## 2016-09-08 MED ORDER — METOCLOPRAMIDE HCL 5 MG PO TABS
5.0000 mg | ORAL_TABLET | Freq: Every day | ORAL | Status: DC
  Administered 2016-09-08 – 2016-09-09 (×2): 5 mg via ORAL
  Filled 2016-09-08 (×2): qty 1

## 2016-09-08 SURGICAL SUPPLY — 22 items
BAG URINE DRAINAGE (UROLOGICAL SUPPLIES) ×4 IMPLANT
BAG URO CATCHER STRL LF (MISCELLANEOUS) ×2 IMPLANT
CATH FOLEY 3WAY 30CC 24FR (CATHETERS) ×1
CATH URTH STD 24FR FL 3W 2 (CATHETERS) ×1 IMPLANT
ELECT REM PT RETURN 9FT ADLT (ELECTROSURGICAL) ×2
ELECTRODE REM PT RTRN 9FT ADLT (ELECTROSURGICAL) ×1 IMPLANT
EVACUATOR ELLICK (MISCELLANEOUS) ×2 IMPLANT
EVACUATOR MICROVAS BLADDER (UROLOGICAL SUPPLIES) ×2 IMPLANT
GLOVE BIO SURGEON STRL SZ7.5 (GLOVE) ×2 IMPLANT
GLOVE BIOGEL PI IND STRL 7.5 (GLOVE) ×2 IMPLANT
GLOVE BIOGEL PI INDICATOR 7.5 (GLOVE) ×2
GOWN STRL REUS W/ TWL XL LVL3 (GOWN DISPOSABLE) ×1 IMPLANT
GOWN STRL REUS W/TWL XL LVL3 (GOWN DISPOSABLE) ×3 IMPLANT
HOLDER FOLEY CATH W/STRAP (MISCELLANEOUS) ×2 IMPLANT
LOOP CUT BIPOLAR 24F LRG (ELECTROSURGICAL) IMPLANT
MANIFOLD NEPTUNE II (INSTRUMENTS) ×2 IMPLANT
PACK CYSTO (CUSTOM PROCEDURE TRAY) ×2 IMPLANT
PLUG CATH AND CAP STER (CATHETERS) ×2 IMPLANT
SET ASPIRATION TUBING (TUBING) ×2 IMPLANT
SYR 30ML LL (SYRINGE) ×4 IMPLANT
SYRINGE IRR TOOMEY STRL 70CC (SYRINGE) ×4 IMPLANT
TUBING CONNECTING 10 (TUBING) ×2 IMPLANT

## 2016-09-08 NOTE — Anesthesia Preprocedure Evaluation (Signed)
Anesthesia Evaluation  Patient identified by MRN, date of birth, ID band Patient awake    Reviewed: Allergy & Precautions, NPO status , Patient's Chart, lab work & pertinent test results  Airway Mallampati: I  TM Distance: >3 FB Neck ROM: Full    Dental   Pulmonary former smoker,    Pulmonary exam normal        Cardiovascular hypertension, + CAD  Normal cardiovascular exam     Neuro/Psych    GI/Hepatic GERD  Medicated and Controlled,  Endo/Other    Renal/GU      Musculoskeletal   Abdominal   Peds  Hematology   Anesthesia Other Findings   Reproductive/Obstetrics                             Anesthesia Physical Anesthesia Plan  ASA: II  Anesthesia Plan: General   Post-op Pain Management:    Induction: Intravenous  Airway Management Planned: LMA  Additional Equipment:   Intra-op Plan:   Post-operative Plan: Extubation in OR  Informed Consent: I have reviewed the patients History and Physical, chart, labs and discussed the procedure including the risks, benefits and alternatives for the proposed anesthesia with the patient or authorized representative who has indicated his/her understanding and acceptance.     Plan Discussed with: CRNA and Surgeon  Anesthesia Plan Comments:         Anesthesia Quick Evaluation

## 2016-09-08 NOTE — Anesthesia Procedure Notes (Signed)
Procedure Name: LMA Insertion Date/Time: 09/08/2016 12:52 PM Performed by: Lind Covert Pre-anesthesia Checklist: Patient identified, Emergency Drugs available, Suction available, Patient being monitored and Timeout performed Patient Re-evaluated:Patient Re-evaluated prior to inductionOxygen Delivery Method: Circle system utilized Preoxygenation: Pre-oxygenation with 100% oxygen Intubation Type: IV induction LMA: LMA inserted LMA Size: 4.0 Number of attempts: 1 Tube secured with: Tape Dental Injury: Teeth and Oropharynx as per pre-operative assessment

## 2016-09-08 NOTE — Progress Notes (Signed)
Pt arrived from PACU on stretcher, slid self to bed. Pt A&Ox4. VSS. CBI running at moderate rate w/ dark pink urine draining, no clots. Pt oriented to callbell and environment. POC discussed. Will monitor.

## 2016-09-08 NOTE — Progress Notes (Signed)
PHARMACIST - PHYSICIAN ORDER COMMUNICATION  CONCERNING: P&T Medication Policy on Herbal Medications  DESCRIPTION:  This patient's order for:   Melatonin  has been noted.  This product(s) is classified as an "herbal" or natural product. Due to a lack of definitive safety studies or FDA approval, nonstandard manufacturing practices, plus the potential risk of unknown drug-drug interactions while on inpatient medications, the Pharmacy and Therapeutics Committee does not permit the use of "herbal" or natural products of this type within [facility].   ACTION TAKEN: The pharmacy department is unable to verify this order at this time and your patient has been informed of this safety policy. Please reevaluate patient's clinical condition at discharge and address if the herbal or natural product(s) should be resumed at that time.  Dia Sitter, PharmD, BCPS 09/08/2016 3:16 PM

## 2016-09-08 NOTE — Anesthesia Postprocedure Evaluation (Signed)
Anesthesia Post Note  Patient: Rockwell Zeldin [facility]  Procedure(s) Performed: Procedure(s) (LRB): TRANSURETHRAL RESECTION OF THE PROSTATE (TURP) (N/A)  Patient location during evaluation: PACU Anesthesia Type: General Level of consciousness: awake and alert Pain management: pain level controlled Vital Signs Assessment: post-procedure vital signs reviewed and stable Respiratory status: spontaneous breathing, nonlabored ventilation, respiratory function stable and patient connected to nasal cannula oxygen Cardiovascular status: blood pressure returned to baseline and stable Postop Assessment: no signs of nausea or vomiting Anesthetic complications: no       Last Vitals:  Vitals:   09/08/16 1415 09/08/16 1430  BP: (!) 150/102   Pulse: 76 72  Resp: 17 14  Temp:      Last Pain:  Vitals:   09/08/16 1035  TempSrc: Oral                 Aryel Edelen DAVID

## 2016-09-08 NOTE — Transfer of Care (Signed)
Immediate Anesthesia Transfer of Care Note  Patient: Nathan Santiago [facility]  Procedure(s) Performed: Procedure(s): TRANSURETHRAL RESECTION OF THE PROSTATE (TURP) (N/A)  Patient Location: PACU  Anesthesia Type:General  Level of Consciousness: sedated  Airway & Oxygen Therapy: Patient Spontanous Breathing and Patient connected to face mask oxygen  Post-op Assessment: Report given to RN and Post -op Vital signs reviewed and stable  Post vital signs: Reviewed and stable  Last Vitals:  Vitals:   09/08/16 1035  BP: 131/60  Pulse: 70  Resp: 16  Temp: 36.3 C    Last Pain:  Vitals:   09/08/16 1035  TempSrc: Oral         Complications: No apparent anesthesia complications

## 2016-09-08 NOTE — Discharge Instructions (Signed)
Transurethral Resection of the Prostate (TURP) or Greenlight laser ablation of the Prostate  Care After  Refer to this sheet in the next few weeks. These discharge instructions provide you with general information on caring for yourself after you leave the hospital. Your caregiver may also give you specific instructions. Your treatment has been planned according to the most current medical practices available, but unavoidable complications sometimes occur. If you have any problems or questions after discharge, please call your caregiver.  HOME CARE INSTRUCTIONS   Medications  You may receive medicine for pain management. As your level of discomfort decreases, adjustments in your pain medicines may be made.   Take all medicines as directed.   You may be given a medicine (antibiotic) to kill germs following surgery. Finish all medicines. Let your caregiver know if you have any side effects or problems from the medicine.   If you are on aspirin, it would be best not to restart the aspirin until the blood in the urine clears  Hold Plavix for 10 days following surgery. Hygiene  You can take a shower after surgery.   You should not take a bath while you still have the urethral catheter. Activity  You will be encouraged to get out of bed as much as possible and increase your activity level as tolerated.   Spend the first week in and around your home. For 3 weeks, avoid the following:   Straining.   Running.   Strenuous work.   Walks longer than a few blocks.   Riding for extended periods.   Sexual relations.   Do not lift heavy objects (more than 20 pounds) for at least 1 month. When lifting, use your arms instead of your abdominal muscles.   You will be encouraged to walk as tolerated. Do not exert yourself. Increase your activity level slowly. Remember that it is important to keep moving after an operation of any type. This cuts down on the possibility of developing blood  clots.   Your caregiver will tell you when you can resume driving and light housework. Discuss this at your first office visit after discharge. Diet  No special diet is ordered after a TURP. However, if you are on a special diet for another medical problem, it should be continued.   Normal fluid intake is usually recommended.   Avoid alcohol and caffeinated drinks for 2 weeks. They irritate the bladder. Decaffeinated drinks are okay.   Avoid spicy foods.  Bladder Function  For the first 10 days, empty the bladder whenever you feel a definite desire. Do not try to hold the urine for long periods of time.   Urinating once or twice a night even after you are healed is not uncommon.   You may see some recurrence of blood in the urine after discharge from the hospital. This usually happens within 2 weeks after the procedure.If this occurs, force fluids again as you did in the hospital and reduce your activity.  Bowel Function  You may experience some constipation after surgery. This can be minimized by increasing fluids and fiber in your diet. Drink enough water and fluids to keep your urine clear or pale yellow.   A stool softener may be prescribed for use at home. Do not strain to move your bowels.   If you are requiring increased pain medicine, it is important that you take stool softeners to prevent constipation. This will help to promote proper healing by reducing the need to strain to move  your bowels.  Sexual Activity  Semen movement in the opposite direction and into the bladder (retrograde ejaculation) may occur. Since the semen passes into the bladder, cloudy urine can occur the first time you urinate after intercourse. Or, you may not have an ejaculation during erection. Ask your caregiver when you can resume sexual activity. Retrograde ejaculation and reduced semen discharge should not reduce one's pleasure of intercourse.  Postoperative Visit  Arrange the date and time of your  after surgery visit with your caregiver.  Return to Work  After your recovery is complete, you will be able to return to work and resume all activities. Your caregiver will inform you when you can return to work.    Foley Catheter Care A soft, flexible tube (Foley catheter) may have been placed in your bladder to drain urine and fluid. Follow these instructions: Taking Care of the Catheter  Keep the area where the catheter leaves your body clean.   Attach the catheter to the leg so there is no tension on the catheter.   Keep the drainage bag below the level of the bladder, but keep it OFF the floor.   Do not take long soaking baths. Your caregiver will give instructions about showering.   Wash your hands before touching ANYTHING related to the catheter or bag.   Using mild soap and warm water on a washcloth:   Clean the area closest to the catheter insertion site using a circular motion around the catheter.   Clean the catheter itself by wiping AWAY from the insertion site for several inches down the tube.   NEVER wipe upward as this could sweep bacteria up into the urethra (tube in your body that normally drains the bladder) and cause infection.   Place a small amount of sterile lubricant at the tip of the penis where the catheter is entering.  Taking Care of the Drainage Bags  Two drainage bags may be taken home: a large overnight drainage bag, and a smaller leg bag which fits underneath clothing.   It is okay to wear the overnight bag at any time, but NEVER wear the smaller leg bag at night.   Keep the drainage bag well below the level of your bladder. This prevents backflow of urine into the bladder and allows the urine to drain freely.   Anchor the tubing to your leg to prevent pulling or tension on the catheter. Use tape or a leg strap provided by the hospital.   Empty the drainage bag when it is 1/2 to 3/4 full. Wash your hands before and after touching the bag.    Periodically check the tubing for kinks to make sure there is no pressure on the tubing which could restrict the flow of urine.  Changing the Drainage Bags  Cleanse both ends of the clean bag with alcohol before changing.   Pinch off the rubber catheter to avoid urine spillage during the disconnection.   Disconnect the dirty bag and connect the clean one.   Empty the dirty bag carefully to avoid a urine spill.   Attach the new bag to the leg with tape or a leg strap.  Cleaning the Drainage Bags  Whenever a drainage bag is disconnected, it must be cleaned quickly so it is ready for the next use.   Wash the bag in warm, soapy water.   Rinse the bag thoroughly with warm water.   Soak the bag for 30 minutes in a solution of white vinegar and  water (1 cup vinegar to 1 quart warm water).   Rinse with warm water.  SEEK MEDICAL CARE IF:   You have chills or night sweats.   You are leaking around your catheter or have problems with your catheter. It is not uncommon to have sporadic leakage around your catheter as a result of bladder spasms. If the leakage stops, there is not much need for concern. If you are uncertain, call your caregiver.   You develop side effects that you think are coming from your medicines.  SEEK IMMEDIATE MEDICAL CARE IF:   You are suddenly unable to urinate. Check to see if there are any kinks in the drainage tubing that may cause this. If you cannot find any kinks, call your caregiver immediately. This is an emergency.   You develop shortness of breath or chest pains.   Bleeding persists or clots develop in your urine.   You have a fever.   You develop pain in your back or over your lower belly (abdomen).   You develop pain or swelling in your legs.   Any problems you are having get worse rather than better.  MAKE SURE YOU:   Understand these instructions.   Will watch your condition.   Will get help right away if you are not doing well or get  worse.

## 2016-09-08 NOTE — H&P (Signed)
f/u Voiding Symptoms  HPI: Nathan Santiago is a 83 year-old male established patient who is here today for interval eval of BPH and lower tract symptoms.  The patient was last seen March 2017. Patient is currently treated with Flomax 0.8mg  for his symptoms. He has previously tried Terazosin for his symptoms.   The patient's symptoms are worse compared to his last visit.   He does have frequency, urgency, and urinary incontinence. He has nocturia 3-4 times per night. The patient has not seen blood in his urine. He does not have pain or burning when he urinates. He does not have to wait a long time to start his urinary stream. His urinary stream does not start and stop during voiding. He feels that he does not empty his bladder. He denies any other associated symptoms. The patient states his most bothersome symptom(s) are the following: frequency and urgency. He does not have trouble with constipation.   Patient does not have a family history of prostate cancer. He has not had UTIs in the last 12 months.   Patient also using behavior modifications for his symptoms including double voiding.   He also has a history of a left sided non-obstructing stone, 45mm at last check.   The patient denies any flank pain or suprapubic pain. He is not having any dysuria or gross hematuria. He is frustrated with his voiding symptoms at this point.      AUA Symptom Score: Less than 50% of the time he has the sensation of not emptying his bladder completely when finished urinating. Less than 20% of the time he has to urinate again fewer than two hours after he has finished urinating. Almost always he has to start and stop again several times when he urinates. Almost always he finds it difficult to postpone urination. Less than 50% of the time he has a weak urinary stream. He never has to push or strain to begin urination. He has to get up to urinate 3 times from the time he goes to bed until the time he gets up in the  morning.   Calculated AUA Symptom Score: 18    QOL Score: He would feel mostly dissatisfied if he had to live with his urinary condition the way it is now for the rest of his life.   Calculated QOL Symptom Score: 4    ALLERGIES: Lisinopril TABS Niaspan TBCR    MEDICATIONS: Tamsulosin Hcl 0.4 mg capsule, ext release 24 hr 2 capsule PO Q HS  AmLODIPine Besylate 5 MG Oral Tablet Oral  Aspirin 81 MG TABS Oral  Atenolol 50 MG Oral Tablet Oral  Biotin CAPS Oral  Fish Oil CAPS Oral  Glucosamine TABS Oral  HydroCHLOROthiazide 25 MG Oral Tablet Oral  Ketorolac Tromethamine 0.5 % Ophthalmic Solution Ophthalmic  Lipitor 20 MG Oral Tablet Oral  Magnesium TABS Oral  Metoclopramide HCl - 5 MG Oral Tablet Oral  Multi-Day Vitamins TABS Oral  Omega 3 CAPS Oral  Omeprazole 40 MG Oral Capsule Delayed Release Oral  Pantoprazole Sodium  Plavix 75 MG Oral Tablet Oral  Pletal 100 MG TABS Oral  Potassium Chloride CPCR Oral  Tamsulosin HCl - 0.4 MG Oral Capsule 0 Oral  TraMADol HCl - 50 MG Oral Tablet 0 Oral  Vitamin C TABS Oral  Vitamin D3 1000 UNIT Oral Tablet Oral  Vytorin 10-80 MG Oral Tablet Oral     GU PSH: TUMT - 2011      PSH Notes: Cataract Surgery, Surg Prostate Transureth  Dest Tissue Microwave Thermotherapy, stent placed in artery of groin (2017)   NON-GU PSH: None   GU PMH: BPH w/LUTS, Benign prostatic hyperplasia with urinary obstruction - 10/12/2015 Personal Hx urinary calculi, History of kidney stones - 10/12/2015 Calculus Kidney and Ureter, Calculus of kidney with calculus of ureter - [id number] Renal Colic, Renal colic - [id number] Urinary Retention, Unspec, Incomplete bladder emptying - 10/04/2014 Urinary Urgency, Urinary urgency - 09/07/2014 Urge incontinence, Urge incontinence of urine - 2016 Urinary Tract Inf, Unspec site, Pyuria - 2014    NON-GU PMH: Encounter for general adult medical examination without abnormal findings, Encounter for preventive health examination -  10/12/2015 Personal history of other diseases of the circulatory system, History of cardiac disorder - 2016, History of hypertension, - 2014 Personal history of other diseases of the digestive system, History of esophageal reflux - 2016 Personal history of other endocrine, nutritional and metabolic disease, History of hypercholesterolemia - 2014    FAMILY HISTORY: Atherosclerosis - Runs In Family Death of family member - Runs In [facility] Family Health Status Number - Runs In Family nephrolithiasis - Runs In Family   SOCIAL HISTORY: Marital Status: Married     Notes: Former smoker, Alcohol Use, Marital History - Currently Married, Occupation:, Caffeine Use, Tobacco Use   REVIEW OF SYSTEMS:    GU Review Male:   Patient denies frequent urination, hard to postpone urination, burning/ pain with urination, get up at night to urinate, leakage of urine, stream starts and stops, trouble starting your stream, have to strain to urinate , erection problems, and penile pain.  Gastrointestinal (Upper):   Patient denies nausea, vomiting, and indigestion/ heartburn.  Gastrointestinal (Lower):   Patient denies diarrhea and constipation.  Constitutional:   Patient denies fever, night sweats, weight loss, and fatigue.  Skin:   Patient denies skin rash/ lesion and itching.  Eyes:   Patient denies blurred vision and double vision.  Ears/ Nose/ Throat:   Patient denies sore throat and sinus problems.  Hematologic/Lymphatic:   Patient denies swollen glands and easy bruising.  Cardiovascular:   Patient denies leg swelling and chest pains.  Respiratory:   Patient denies cough and shortness of breath.  Endocrine:   Patient denies excessive thirst.  Musculoskeletal:   Patient denies back pain and joint pain.  Neurological:   Patient denies headaches and dizziness.  Psychologic:   Patient denies depression and anxiety.   VITAL SIGNS:      08/15/2016 10:30 AM  Weight 170 lb / 77.11 kg  Height 65 in / 165.1 cm  BP  153/95 mmHg  Pulse 57 /min  BMI 28.3 kg/m   PAST DATA REVIEWED:  Source Of History:  Patient   02/27/07 01/12/06 01/10/05 07/08/04 01/06/04 01/22/03  PSA  Total PSA 0.60  0.48  0.41  0.50  0.13  0.20     PROCEDURES:         Flexible Cystoscopy - 52000  Risks, benefits, and some of the potential complications of the procedure were discussed at length with the patient including infection, bleeding, voiding discomfort, urinary retention, fever, chills, sepsis, and others. All questions were answered. Informed consent was obtained. Antibiotic prophylaxis was given. Sterile technique and intraurethral analgesia were used.  Meatus:  Normal size. Normal location. Normal condition.  Urethra:  No strictures.  External Sphincter:  Normal.  Verumontanum:  Normal.  Prostate:  Short prostatic urethra, Obstructing. Severe hyperplasia.  Bladder Neck:  Non-obstructing.  Ureteral Orifices:  Normal location. Normal size. Normal shape. Effluxed clear  urine.  Bladder:  Mild trabeculation. No tumors. Normal mucosa. No stones.      The lower urinary tract was carefully examined. The procedure was well-tolerated and without complications. Antibiotic instructions were given. Instructions were given to call the office immediately for bloody urine, difficulty urinating, urinary retention, painful or frequent urination, fever, chills, nausea, vomiting or other illness. The patient stated that he understood these instructions and would comply with them.         KUB - [id number]  A single view of the abdomen is obtained. Renal shadows are easily visualized bilaterally. There is a 6.5 mm stone in the left lower pole There are no additional calcifications along the expected location of either ureter bilaterally.  Gas pattern is grossly normal. No significant bony abnormalities.      Impression: The patient has 6.5 mm stone in the left lower pole, nonobstructing, unchanged from one year prior.           PVR  Ultrasound - [id number]  Scanned Volume: 322 cc         Urinalysis w/Scope Dipstick Dipstick Cont'd Micro  Color: Yellow Bilirubin: Neg WBC/hpf: 0 - 5/hpf  Appearance: Clear Ketones: Neg RBC/hpf: 10 - 20/hpf  Specific Gravity: 1.020 Blood: 3+ Bacteria: Rare (0-9/hpf)  pH: 6.0 Protein: Neg Cystals: NS (Not Seen)  Glucose: Neg Urobilinogen: 0.2 Casts: NS (Not Seen)    Nitrites: Neg Trichomonas: Not Present    Leukocyte Esterase: Neg Mucous: Not Present      Epithelial Cells: 0 - 5/hpf      Yeast: NS (Not Seen)      Sperm: Not Present    ASSESSMENT:      ICD-10 Details  1 GU:   BPH w/LUTS - N40.1    PLAN:           Orders Labs Urine Culture and Sensitivity          Document Letter(s):  Created for Patient: Clinical Summary         Notes:   The patient has failed medical management as well as behavior modifications as relates to his symptomatic obstructing prostate and lower urinary tract symptoms. This point, the patient is ready to take the next step which would be surgical intervention. We discussed several options including laser prostate ablation and transurethral resection of prostate. With either procedure the patient will need to stop this Plavix.   After further discussion, we have opted to proceed with a bipolar TURP or discussed the risks and benefits of the operation. He understands that he will be admitted for 23 hour observation with a Foley catheter. We'll then try to remove the catheter and have the patient void prior to discharge.

## 2016-09-08 NOTE — Op Note (Signed)
Preoperative diagnosis:  1. Bladder outlet obstruction 2. Urinary retention  Postoperative diagnosis:  1. same   Procedure:  1. Transurethral resection of prostate  Surgeon: Ardis Hughs, MD   Anesthesia: General   Complications: None   Intraoperative findings: Cystoscopy demonstrated a prostate with high median bar with obstructive median lobe and a trabeculated bladder.  UOs were orthopic.    EBL: 100cc   Specimens: Prostate chips  Indication: Nathan Santiago is a 83 y.o. patient with bladder outlet obstruction/urinary retention/obstructive voiding symptoms.  After reviewing the management options for treatment, he elected to proceed with the above surgical procedure(s). We have discussed the potential benefits and risks of the procedure, side effects of the proposed treatment, the likelihood of the patient achieving the goals of the procedure, and any potential problems that might occur during the procedure or recuperation. Informed consent has been obtained.  Description of procedure:  The patient was taken to the operating room and general anesthesia was induced. The patient was placed in the dorsal lithotomy position, prepped and draped in the usual sterile fashion, and preoperative antibiotics were administered. A preoperative time-out was performed.   I then gently passed the 21 French 30 cystoscope into the patient's urethra and up into the bladder under visual guidance. A 360 cystoscopic evaluation was then performed with the above findings.  I then removed the 21 French cystoscopic sheath and replacement with a 26 French resectoscope sheath was passed and using the visual obturator under direct vision. An exchange the obturator for the resectoscope itself and the loop cautery. I then proceeded to create grooves within the prostate at the 7:00 position extending the defect from the bladder neck at 7:00 down to the prostate apex just lateral to the verumontanum. I took  this groove down to the capsule. I then performed a similar maneuver on the patient's left side at the 5:00 position taking the prostate down from the bladder neck to the apex and down the prostatic capsule. At this point I proceeded to resect the patient's right lateral lobe in a systematic fashion moving from the 7:00 position up to approximately 11. The resection was taken down to the prostate capsule. Hemostasis was achieved on this side prior to moving to the patient's left lateral lobe. A similar sequence was performed taking the prostate down from the 5:00 position to approximately the 1:00 position to the level of the prostatic capsule. I then completed the resection of the posterior wall as well as the area between 5:00 and 7:00 along the bladder neck. I was careful not to undermine the bladder neck or resect the inferior. I then did resect the area that was protruding into the prostatic urethra anteriorly.   Once I was satisfied that the prostate had been adequately resected I evacuated the prostate chips using a [place] syringe. I then reintroduced the resectoscope and ensured adequate hemostasis. I then left the bladder full and removed the resectoscope entirely. Exam under anesthesia demonstrated a normal sized prostate with no nodules.  Ardis Hughs, MD

## 2016-09-09 DIAGNOSIS — R338 Other retention of urine: Secondary | ICD-10-CM | POA: Diagnosis not present

## 2016-09-09 DIAGNOSIS — C61 Malignant neoplasm of prostate: Secondary | ICD-10-CM | POA: Diagnosis not present

## 2016-09-09 DIAGNOSIS — R350 Frequency of micturition: Secondary | ICD-10-CM | POA: Diagnosis not present

## 2016-09-09 DIAGNOSIS — N320 Bladder-neck obstruction: Secondary | ICD-10-CM | POA: Diagnosis not present

## 2016-09-09 DIAGNOSIS — R351 Nocturia: Secondary | ICD-10-CM | POA: Diagnosis not present

## 2016-09-09 DIAGNOSIS — N401 Enlarged prostate with lower urinary tract symptoms: Secondary | ICD-10-CM | POA: Diagnosis not present

## 2016-09-09 NOTE — Progress Notes (Signed)
Tried to slow CBI but urine turned dark red but no clots noted, adjusted CBI rate. pt encourage increase fluid intake.

## 2016-09-09 NOTE — Progress Notes (Signed)
1 Day Post-Op Subjective: Patient reports no pain overnight.  There've been no catheter related issues.  Objective: Vital signs in last 24 hours: Temp:  [97.4 F (36.3 C)-98.2 F (36.8 C)] 98 F (36.7 C) (02/10 0505) Pulse Rate:  [66-86] 69 (02/10 0505) Resp:  [12-18] 18 (02/10 0013) BP: (131-168)/(54-109) 152/59 (02/10 0505) SpO2:  [93 %-99 %] 95 % (02/10 0505) Weight:  [78.9 kg (174 lb)] 78.9 kg (174 lb) (02/09 1052)  Intake/Output from previous day: 02/09 0701 - 02/10 0700 In: 15711.7 [P.O.:840; I.V.:1871.7] Out: 18300 [Urine:18300] Intake/Output this shift: Total I/O In: -  Out: 2200 [Urine:2200]  Physical Exam:  Constitutional: Vital signs reviewed. WD WN in NAD   Eyes: PERRL, No scleral icterus.   Cardiovascular: RRR Pulmonary/Chest: Normal effort   His irrigation is pink, without clots.  Still running fairly briskly.  Lab Results:  Recent Labs  09/06/16 1033 09/08/16 1506  HGB 12.5* 12.4*  HCT 36.9* 37.1*   BMET  Recent Labs  09/06/16 1033 09/08/16 1506  NA 139 142  K 4.4 3.9  CL 105 110  CO2 28 27  GLUCOSE 102* 101*  BUN 22* 17  CREATININE 0.96 0.90  CALCIUM 9.3 8.3*   No results for input(s): LABPT, INR in the last 72 hours. No results for input(s): LABURIN in the last 72 hours. Results for orders placed or performed during the hospital encounter of 10/28/10  Surgical pcr screen     Status: None   Collection Time: 10/28/10  9:09 AM  Result Value Ref Range Status   MRSA, PCR NEGATIVE NEGATIVE Final   Staphylococcus aureus  NEGATIVE Final    NEGATIVE        The Xpert SA Assay (FDA approved for NASAL specimens only), is one component of a comprehensive surveillance program.  It is not intended to diagnose infection nor to guide or monitor treatment.    Studies/Results: No results found.  Assessment/Plan:  Postoperative day #1 TURP.  Although he has not had significant clots, the CBI has been running briskly.  I do not think it is  ready to be stopped at this point.  Catheter removal, possibly in the morning.  LOS: 0 days   Franchot Gallo M 09/09/2016, 8:53 AM

## 2016-09-10 DIAGNOSIS — N320 Bladder-neck obstruction: Secondary | ICD-10-CM | POA: Diagnosis not present

## 2016-09-10 DIAGNOSIS — N401 Enlarged prostate with lower urinary tract symptoms: Secondary | ICD-10-CM | POA: Diagnosis not present

## 2016-09-10 DIAGNOSIS — R338 Other retention of urine: Secondary | ICD-10-CM | POA: Diagnosis not present

## 2016-09-10 DIAGNOSIS — R350 Frequency of micturition: Secondary | ICD-10-CM | POA: Diagnosis not present

## 2016-09-10 DIAGNOSIS — C61 Malignant neoplasm of prostate: Secondary | ICD-10-CM | POA: Diagnosis not present

## 2016-09-10 DIAGNOSIS — R351 Nocturia: Secondary | ICD-10-CM | POA: Diagnosis not present

## 2016-09-10 MED ORDER — CLOPIDOGREL BISULFATE 75 MG PO TABS: 75.0000 mg | ORAL_TABLET | Freq: Every day | ORAL | Status: DC

## 2016-09-10 MED ORDER — CEPHALEXIN 500 MG PO CAPS
500.0000 mg | ORAL_CAPSULE | Freq: Once | ORAL | Status: AC
  Administered 2016-09-10: 500 mg via ORAL
  Filled 2016-09-10: qty 1

## 2016-09-10 NOTE — Progress Notes (Signed)
Tried turning off CBI 30 min prior to 5 and urine dark brown immediately. Won't pull foley at this time. Will restart CBI at low rate.

## 2016-09-10 NOTE — Discharge Summary (Signed)
Physician Discharge Summary  Patient ID: Nathan Santiago MRN: [id number] DOB/AGE: 1931/05/10 83 y.o.  Admit date: 09/08/2016 Discharge date: 09/10/2016  Admission Diagnoses:  Discharge Diagnoses:  Active Problems:   Urinary frequency   Discharged Condition: Good  Hospital Course: Admit after TURP. CBI stopped POD#1 and restarted later in the day. Urine red then to brown and light pink. Irrigated POD#2 morning and no active bleeding noted. Some small clots irrigated. Filled to 250 ml and he voided 100 ml with a good flow. He urine was racked and he voided three more times with no difficulty and light pink urine. He was discharged.   Consults: None  Significant Diagnostic Studies: none   Treatments: surgery: Transurethral resection of prostate (TURP)  Bladder irrigation - foley was irrigated quickly to very light pink and a few old clots drained. Balloon let down and no bleeding noted. Filled to 250 ml and foley d/c'd. Will cover with a cephalexin 500 mg po X 1 .   Discharge Exam: Blood pressure (!) 162/73, pulse 69, temperature 98.2 F (36.8 C), temperature source Oral, resp. rate 20, height 5\' 4"  (1.626 m), weight 78.9 kg (174 lb), SpO2 95 %. NAD Abd - soft, NT Ext - no calf pain or swelling.    Disposition: 01-Home or Self Care    Follow-up Information    Ardis Hughs, MD On 10/20/2016.   Specialty:  Urology Why:  3:30pm Contact information: [place] [address] [phone]           Signed: Festus Aloe 09/10/2016, 9:56 AM

## 2016-09-12 DIAGNOSIS — I2510 Atherosclerotic heart disease of native coronary artery without angina pectoris: Secondary | ICD-10-CM | POA: Diagnosis not present

## 2016-09-12 DIAGNOSIS — Z1389 Encounter for screening for other disorder: Secondary | ICD-10-CM | POA: Diagnosis not present

## 2016-09-12 DIAGNOSIS — I6529 Occlusion and stenosis of unspecified carotid artery: Secondary | ICD-10-CM | POA: Diagnosis not present

## 2016-09-12 DIAGNOSIS — R269 Unspecified abnormalities of gait and mobility: Secondary | ICD-10-CM | POA: Diagnosis not present

## 2016-09-12 DIAGNOSIS — Z6829 Body mass index (BMI) 29.0-29.9, adult: Secondary | ICD-10-CM | POA: Diagnosis not present

## 2016-09-12 DIAGNOSIS — I10 Essential (primary) hypertension: Secondary | ICD-10-CM | POA: Diagnosis not present

## 2016-09-12 DIAGNOSIS — I714 Abdominal aortic aneurysm, without rupture: Secondary | ICD-10-CM | POA: Diagnosis not present

## 2016-09-12 DIAGNOSIS — I739 Peripheral vascular disease, unspecified: Secondary | ICD-10-CM | POA: Diagnosis not present

## 2016-09-12 DIAGNOSIS — Z0000 Encounter for general adult medical examination without abnormal findings: Secondary | ICD-10-CM | POA: Diagnosis not present

## 2016-09-12 DIAGNOSIS — C61 Malignant neoplasm of prostate: Secondary | ICD-10-CM | POA: Diagnosis not present

## 2016-09-12 DIAGNOSIS — E784 Other hyperlipidemia: Secondary | ICD-10-CM | POA: Diagnosis not present

## 2016-09-12 DIAGNOSIS — R7309 Other abnormal glucose: Secondary | ICD-10-CM | POA: Diagnosis not present

## 2016-09-15 DIAGNOSIS — R338 Other retention of urine: Secondary | ICD-10-CM | POA: Diagnosis not present

## 2016-09-15 DIAGNOSIS — R339 Retention of urine, unspecified: Secondary | ICD-10-CM | POA: Diagnosis not present

## 2016-10-04 DIAGNOSIS — R310 Gross hematuria: Secondary | ICD-10-CM | POA: Diagnosis not present

## 2016-10-20 DIAGNOSIS — N401 Enlarged prostate with lower urinary tract symptoms: Secondary | ICD-10-CM | POA: Diagnosis not present

## 2016-11-07 ENCOUNTER — Other Ambulatory Visit: Payer: Self-pay | Admitting: Cardiovascular Disease

## 2016-11-07 DIAGNOSIS — I739 Peripheral vascular disease, unspecified: Secondary | ICD-10-CM

## 2016-11-21 ENCOUNTER — Inpatient Hospital Stay (HOSPITAL_COMMUNITY): Admission: RE | Admit: 2016-11-21 | Payer: Medicare Other | Source: Ambulatory Visit

## 2016-11-21 DIAGNOSIS — R310 Gross hematuria: Secondary | ICD-10-CM | POA: Diagnosis not present

## 2016-11-21 DIAGNOSIS — C61 Malignant neoplasm of prostate: Secondary | ICD-10-CM | POA: Diagnosis not present

## 2016-11-22 ENCOUNTER — Encounter (HOSPITAL_COMMUNITY): Payer: Self-pay | Admitting: *Deleted

## 2016-11-22 ENCOUNTER — Emergency Department (HOSPITAL_COMMUNITY): Payer: Medicare Other

## 2016-11-22 ENCOUNTER — Observation Stay (HOSPITAL_COMMUNITY): Payer: Medicare Other

## 2016-11-22 ENCOUNTER — Inpatient Hospital Stay (HOSPITAL_COMMUNITY)
Admission: EM | Admit: 2016-11-22 | Discharge: 2016-11-24 | DRG: 149 | Disposition: A | Discharge status: Home Health | Payer: Medicare Other | Attending: Internal Medicine | Admitting: Internal Medicine

## 2016-11-22 ENCOUNTER — Telehealth: Payer: Self-pay | Admitting: Cardiovascular Disease

## 2016-11-22 DIAGNOSIS — Z888 Allergy status to other drugs, medicaments and biological substances status: Secondary | ICD-10-CM

## 2016-11-22 DIAGNOSIS — Z79899 Other long term (current) drug therapy: Secondary | ICD-10-CM

## 2016-11-22 DIAGNOSIS — I779 Disorder of arteries and arterioles, unspecified: Secondary | ICD-10-CM | POA: Diagnosis present

## 2016-11-22 DIAGNOSIS — E876 Hypokalemia: Secondary | ICD-10-CM | POA: Diagnosis present

## 2016-11-22 DIAGNOSIS — I6522 Occlusion and stenosis of left carotid artery: Secondary | ICD-10-CM | POA: Diagnosis present

## 2016-11-22 DIAGNOSIS — E7800 Pure hypercholesterolemia, unspecified: Secondary | ICD-10-CM | POA: Diagnosis present

## 2016-11-22 DIAGNOSIS — Z9849 Cataract extraction status, unspecified eye: Secondary | ICD-10-CM

## 2016-11-22 DIAGNOSIS — N400 Enlarged prostate without lower urinary tract symptoms: Secondary | ICD-10-CM | POA: Diagnosis present

## 2016-11-22 DIAGNOSIS — R42 Dizziness and giddiness: Secondary | ICD-10-CM | POA: Diagnosis not present

## 2016-11-22 DIAGNOSIS — K3184 Gastroparesis: Secondary | ICD-10-CM | POA: Diagnosis present

## 2016-11-22 DIAGNOSIS — Z87891 Personal history of nicotine dependence: Secondary | ICD-10-CM

## 2016-11-22 DIAGNOSIS — R319 Hematuria, unspecified: Secondary | ICD-10-CM | POA: Diagnosis not present

## 2016-11-22 DIAGNOSIS — I739 Peripheral vascular disease, unspecified: Secondary | ICD-10-CM | POA: Diagnosis present

## 2016-11-22 DIAGNOSIS — R2681 Unsteadiness on feet: Secondary | ICD-10-CM | POA: Diagnosis not present

## 2016-11-22 DIAGNOSIS — N390 Urinary tract infection, site not specified: Secondary | ICD-10-CM | POA: Diagnosis not present

## 2016-11-22 DIAGNOSIS — I10 Essential (primary) hypertension: Secondary | ICD-10-CM | POA: Diagnosis present

## 2016-11-22 DIAGNOSIS — K219 Gastro-esophageal reflux disease without esophagitis: Secondary | ICD-10-CM | POA: Diagnosis not present

## 2016-11-22 DIAGNOSIS — M549 Dorsalgia, unspecified: Secondary | ICD-10-CM

## 2016-11-22 DIAGNOSIS — Z955 Presence of coronary angioplasty implant and graft: Secondary | ICD-10-CM

## 2016-11-22 DIAGNOSIS — Z8711 Personal history of peptic ulcer disease: Secondary | ICD-10-CM

## 2016-11-22 DIAGNOSIS — E785 Hyperlipidemia, unspecified: Secondary | ICD-10-CM | POA: Diagnosis present

## 2016-11-22 DIAGNOSIS — Z8679 Personal history of other diseases of the circulatory system: Secondary | ICD-10-CM | POA: Diagnosis present

## 2016-11-22 DIAGNOSIS — R531 Weakness: Secondary | ICD-10-CM

## 2016-11-22 DIAGNOSIS — Z7982 Long term (current) use of aspirin: Secondary | ICD-10-CM

## 2016-11-22 DIAGNOSIS — K279 Peptic ulcer, site unspecified, unspecified as acute or chronic, without hemorrhage or perforation: Secondary | ICD-10-CM | POA: Diagnosis present

## 2016-11-22 DIAGNOSIS — I714 Abdominal aortic aneurysm, without rupture: Secondary | ICD-10-CM | POA: Diagnosis present

## 2016-11-22 DIAGNOSIS — R404 Transient alteration of awareness: Secondary | ICD-10-CM | POA: Diagnosis not present

## 2016-11-22 DIAGNOSIS — Z7901 Long term (current) use of anticoagulants: Secondary | ICD-10-CM

## 2016-11-22 DIAGNOSIS — I2510 Atherosclerotic heart disease of native coronary artery without angina pectoris: Secondary | ICD-10-CM | POA: Diagnosis present

## 2016-11-22 LAB — COMPREHENSIVE METABOLIC PANEL
ALT: 24 U/L (ref 17–63)
AST: 22 U/L (ref 15–41)
Albumin: 3.7 g/dL (ref 3.5–5.0)
Alkaline Phosphatase: 72 U/L (ref 38–126)
Anion gap: 8 (ref 5–15)
BUN: 28 mg/dL — AB (ref 6–20)
CALCIUM: 9.2 mg/dL (ref 8.9–10.3)
CHLORIDE: 104 mmol/L (ref 101–111)
CO2: 27 mmol/L (ref 22–32)
CREATININE: 0.99 mg/dL (ref 0.61–1.24)
GFR calc Af Amer: >60 mL/min (ref >60)
Glucose, Bld: 121 mg/dL — ABNORMAL HIGH (ref 65–99)
Potassium: 3.4 mmol/L — ABNORMAL LOW (ref 3.5–5.1)
Sodium: 139 mmol/L (ref 135–145)
Total Bilirubin: 0.6 mg/dL (ref 0.3–1.2)
Total Protein: 7.6 g/dL (ref 6.5–8.1)

## 2016-11-22 LAB — CBC WITH DIFFERENTIAL/PLATELET
BASOS ABS: 0 10*3/uL (ref 0.0–0.1)
Basophils Relative: 0 %
EOS PCT: 0 %
Eosinophils Absolute: 0 10*3/uL (ref 0.0–0.7)
HEMATOCRIT: 36.3 % — AB (ref 39.0–52.0)
Hemoglobin: 12.1 g/dL — ABNORMAL LOW (ref 13.0–17.0)
LYMPHS PCT: 6 %
Lymphs Abs: 0.7 10*3/uL (ref 0.7–4.0)
MCH: 28.9 pg (ref 26.0–34.0)
MCHC: 33.3 g/dL (ref 30.0–36.0)
MCV: 86.6 fL (ref 78.0–100.0)
Monocytes Absolute: 0.9 10*3/uL (ref 0.1–1.0)
Monocytes Relative: 8 %
NEUTROS ABS: 9.8 10*3/uL — AB (ref 1.7–7.7)
NEUTROS PCT: 86 %
PLATELETS: 172 10*3/uL (ref 150–400)
RBC: 4.19 MIL/uL — AB (ref 4.22–5.81)
RDW: 13.1 % (ref 11.5–15.5)
WBC: 11.4 10*3/uL — ABNORMAL HIGH (ref 4.0–10.5)

## 2016-11-22 LAB — URINALYSIS, ROUTINE W REFLEX MICROSCOPIC
BILIRUBIN URINE: NEGATIVE
Glucose, UA: NEGATIVE mg/dL
KETONES UR: NEGATIVE mg/dL
Nitrite: NEGATIVE
Protein, ur: 30 mg/dL — AB
Specific Gravity, Urine: 1.011 (ref 1.005–1.030)
pH: 5 (ref 5.0–8.0)

## 2016-11-22 LAB — TROPONIN I: Troponin I: <0.03 ng/mL (ref <0.03)

## 2016-11-22 MED ORDER — ACETAMINOPHEN 160 MG/5ML PO SOLN: 650.0000 mg | ORAL | Status: DC | PRN

## 2016-11-22 MED ORDER — BENZONATATE 100 MG PO CAPS: 100.0000 mg | ORAL_CAPSULE | Freq: Three times a day (TID) | ORAL | Status: DC | PRN

## 2016-11-22 MED ORDER — CIPROFLOXACIN HCL 500 MG PO TABS
500.0000 mg | ORAL_TABLET | Freq: Two times a day (BID) | ORAL | Status: DC
  Administered 2016-11-22 – 2016-11-24 (×4): 500 mg via ORAL
  Filled 2016-11-22 (×5): qty 1

## 2016-11-22 MED ORDER — VITAMIN D 1000 UNITS PO TABS
1000.0000 [IU] | ORAL_TABLET | Freq: Every day | ORAL | Status: DC
  Administered 2016-11-23 – 2016-11-24 (×2): 1000 [IU] via ORAL
  Filled 2016-11-22 (×2): qty 1

## 2016-11-22 MED ORDER — ACETAMINOPHEN 650 MG RE SUPP: 650.0000 mg | RECTAL | Status: DC | PRN

## 2016-11-22 MED ORDER — CLOPIDOGREL BISULFATE 75 MG PO TABS
75.0000 mg | ORAL_TABLET | Freq: Every day | ORAL | Status: DC
  Filled 2016-11-22: qty 1

## 2016-11-22 MED ORDER — PANTOPRAZOLE SODIUM 40 MG PO TBEC
40.0000 mg | DELAYED_RELEASE_TABLET | Freq: Every day | ORAL | Status: DC
  Administered 2016-11-23 – 2016-11-24 (×2): 40 mg via ORAL
  Filled 2016-11-22 (×2): qty 1

## 2016-11-22 MED ORDER — ATORVASTATIN CALCIUM 40 MG PO TABS
40.0000 mg | ORAL_TABLET | Freq: Every day | ORAL | Status: DC
  Administered 2016-11-23: 40 mg via ORAL
  Filled 2016-11-22: qty 1

## 2016-11-22 MED ORDER — ACETAMINOPHEN 325 MG PO TABS
650.0000 mg | ORAL_TABLET | ORAL | Status: DC | PRN
  Administered 2016-11-23 (×2): 650 mg via ORAL
  Filled 2016-11-22 (×2): qty 2

## 2016-11-22 MED ORDER — HYDROCHLOROTHIAZIDE 25 MG PO TABS
25.0000 mg | ORAL_TABLET | Freq: Every day | ORAL | Status: DC
  Administered 2016-11-23 – 2016-11-24 (×2): 25 mg via ORAL
  Filled 2016-11-22 (×2): qty 1

## 2016-11-22 MED ORDER — VITAMIN C 500 MG PO TABS
500.0000 mg | ORAL_TABLET | Freq: Every day | ORAL | Status: DC
  Administered 2016-11-23 – 2016-11-24 (×2): 500 mg via ORAL
  Filled 2016-11-22 (×2): qty 1

## 2016-11-22 MED ORDER — MECLIZINE HCL 25 MG PO TABS
12.5000 mg | ORAL_TABLET | Freq: Three times a day (TID) | ORAL | Status: DC | PRN
  Filled 2016-11-22: qty 0.5

## 2016-11-22 MED ORDER — CILOSTAZOL 100 MG PO TABS
100.0000 mg | ORAL_TABLET | Freq: Every day | ORAL | Status: DC
  Administered 2016-11-23 – 2016-11-24 (×2): 100 mg via ORAL
  Filled 2016-11-22 (×2): qty 1

## 2016-11-22 MED ORDER — ONDANSETRON HCL 4 MG/2ML IJ SOLN
4.0000 mg | Freq: Three times a day (TID) | INTRAMUSCULAR | Status: DC | PRN
Start: 2016-11-22 — End: 2016-11-24

## 2016-11-22 MED ORDER — AMLODIPINE BESYLATE 5 MG PO TABS
5.0000 mg | ORAL_TABLET | Freq: Every day | ORAL | Status: DC
  Administered 2016-11-23 – 2016-11-24 (×2): 5 mg via ORAL
  Filled 2016-11-22 (×2): qty 1

## 2016-11-22 MED ORDER — ENOXAPARIN SODIUM 40 MG/0.4ML ~~LOC~~ SOLN
40.0000 mg | Freq: Every day | SUBCUTANEOUS | Status: DC
  Administered 2016-11-22 – 2016-11-23 (×2): 40 mg via SUBCUTANEOUS
  Filled 2016-11-22 (×2): qty 0.4

## 2016-11-22 MED ORDER — GLUCOSAMINE 500 MG PO CAPS: 1500.0000 mg | ORAL_CAPSULE | Freq: Every day | ORAL | Status: DC

## 2016-11-22 MED ORDER — ASPIRIN EC 81 MG PO TBEC
81.0000 mg | DELAYED_RELEASE_TABLET | Freq: Every day | ORAL | Status: DC
  Administered 2016-11-23 – 2016-11-24 (×2): 81 mg via ORAL
  Filled 2016-11-22 (×2): qty 1

## 2016-11-22 MED ORDER — SENNOSIDES-DOCUSATE SODIUM 8.6-50 MG PO TABS: 1.0000 | ORAL_TABLET | Freq: Every evening | ORAL | Status: DC | PRN

## 2016-11-22 MED ORDER — ZOLPIDEM TARTRATE 5 MG PO TABS
5.0000 mg | ORAL_TABLET | Freq: Every evening | ORAL | Status: DC | PRN
Start: 2016-11-22 — End: 2016-11-24

## 2016-11-22 MED ORDER — SODIUM CHLORIDE 0.9 % IV SOLN
INTRAVENOUS | Status: DC
  Administered 2016-11-22 – 2016-11-23 (×2): via INTRAVENOUS

## 2016-11-22 MED ORDER — SODIUM CHLORIDE 0.9 % IV SOLN
INTRAVENOUS | Status: DC
Start: 2016-11-22 — End: 2016-11-22

## 2016-11-22 MED ORDER — POTASSIUM CHLORIDE 20 MEQ/15ML (10%) PO SOLN
20.0000 meq | Freq: Once | ORAL | Status: AC
Start: 2016-11-22 — End: 2016-11-22
  Administered 2016-11-22: 20 meq via ORAL
  Filled 2016-11-22: qty 15

## 2016-11-22 MED ORDER — LOSARTAN POTASSIUM 50 MG PO TABS
50.0000 mg | ORAL_TABLET | Freq: Every day | ORAL | Status: DC
  Administered 2016-11-23 – 2016-11-24 (×2): 50 mg via ORAL
  Filled 2016-11-22 (×2): qty 1

## 2016-11-22 MED ORDER — ADULT MULTIVITAMIN LIQUID CH
15.0000 mL | Freq: Every day | ORAL | Status: DC
  Administered 2016-11-23 – 2016-11-24 (×2): 15 mL via ORAL
  Filled 2016-11-22 (×2): qty 15

## 2016-11-22 MED ORDER — BIOTIN 5000 MCG PO CAPS: 5000.0000 ug | ORAL_CAPSULE | Freq: Every day | ORAL | Status: DC

## 2016-11-22 MED ORDER — HYDRALAZINE HCL 20 MG/ML IJ SOLN: 5.0000 mg | INTRAMUSCULAR | Status: DC | PRN

## 2016-11-22 MED ORDER — TRAMADOL HCL 50 MG PO TABS
50.0000 mg | ORAL_TABLET | Freq: Four times a day (QID) | ORAL | Status: DC | PRN
  Administered 2016-11-24 (×2): 50 mg via ORAL
  Filled 2016-11-22 (×2): qty 1

## 2016-11-22 MED ORDER — STROKE: EARLY STAGES OF RECOVERY BOOK
Freq: Once | Status: DC
  Filled 2016-11-22: qty 1

## 2016-11-22 MED ORDER — MELATONIN 5 MG PO CAPS: 5.0000 mg | ORAL_CAPSULE | Freq: Every evening | ORAL | Status: DC | PRN

## 2016-11-22 MED ORDER — POLYVINYL ALCOHOL 1.4 % OP SOLN
1.0000 [drp] | Freq: Every day | OPHTHALMIC | Status: DC | PRN
  Filled 2016-11-22: qty 15

## 2016-11-22 MED ORDER — OMEGA-3-ACID ETHYL ESTERS 1 G PO CAPS
2.0000 g | ORAL_CAPSULE | Freq: Every day | ORAL | Status: DC
  Administered 2016-11-23 – 2016-11-24 (×2): 2 g via ORAL
  Filled 2016-11-22 (×2): qty 2

## 2016-11-22 MED ORDER — DIAZEPAM 5 MG PO TABS
5.0000 mg | ORAL_TABLET | Freq: Three times a day (TID) | ORAL | Status: DC | PRN
  Administered 2016-11-22 – 2016-11-23 (×2): 5 mg via ORAL
  Filled 2016-11-22 (×2): qty 1

## 2016-11-22 MED ORDER — ATENOLOL 50 MG PO TABS
50.0000 mg | ORAL_TABLET | Freq: Every day | ORAL | Status: DC
  Administered 2016-11-23 – 2016-11-24 (×2): 50 mg via ORAL
  Filled 2016-11-22 (×2): qty 1

## 2016-11-22 MED ORDER — CALCIUM CARBONATE-VITAMIN D 500-200 MG-UNIT PO TABS
1.0000 | ORAL_TABLET | Freq: Every day | ORAL | Status: DC
  Administered 2016-11-24: 1 via ORAL
  Filled 2016-11-22 (×2): qty 1

## 2016-11-22 NOTE — ED Triage Notes (Signed)
Per EMS, pt from [facility] complains of generalized weakness and dizziness since 3PM today. Pt diagnosed with UTI yesterday, started on Cipro. Pt also complains of lower back pain, which his PCP told him is related to his bladder infection. Pt had temp of 98.6 at facility. CBG 129.   BP 162/72 HR 94 RR 18 SpO2 94% RA

## 2016-11-22 NOTE — ED Provider Notes (Signed)
[facility] DEPT Provider Note   CSN: [id number] Arrival date & time: 11/22/16  1719     History   Chief Complaint Chief Complaint  Patient presents with  . Weakness    HPI TRES GRZYWACZ is a 83 y.o. male.  The history is provided by the patient and medical records. No language interpreter was used.  Weakness    BENEDETTO RYDER is a 83 y.o. male  with a PMH of HTN, HLD, CAD, bilateral carotid disease who presents to the Emergency Department complaining of Generalized weakness beginning around 3 PM today. Patient states that he "couldn't lift myself out of the bed" and suddenly felt very off balance. He states since the incident, he has continued to feel very fatigued and has no energy. There is no slurred speech or facial asymmetry per wife at bedside. Seemed to be all 4 extremities that felt weak, no unilateral weakness. He did not fall or hit his head. Patient states that he was having low back pain over the last week and saw his urologist yesterday who diagnosed him with a UTI. He was started on Cipro and has taken 2 doses, one last night and one this morning. His back pain feels better right now. No other medications taken prior to arrival for his symptoms. About a month ago, he had a very similar episode. His whole body felt weak and he caught the nurse at their assisted living facility. All of his symptoms resolved and he was told this could have been a TIA. He did not seek medical care. Wife notes at that time, there was some gargling speech that was not present today. No fever or chills. No abdominal pain. No numbness or tingling. No saddle anesthesia or bowel/bladder incontinence.  Past Medical History:  Diagnosis Date  . AAA (abdominal aortic aneurysm) (HCC)    asymptomatic  . AAA (abdominal aortic aneurysm) ([place]) 2010   peripheral  angiogram-- bilateral SFA DISEASE  and 60 to 70% infrarenaal abd. aortic stenosis with 15 -mm gradient  . Adenomatous colon polyp 11/2003    . CAD (coronary artery disease)   . Gastroparesis   . GERD (gastroesophageal reflux disease)    w/ LPR  . Hyperlipidemia   . Hypertension   . Peripheral arterial disease ([place])    RCEA  by Dr Lemar Livings  . PUD (peptic ulcer disease)     Patient Active Problem List   Diagnosis Date Noted  . UTI (urinary tract infection) 11/22/2016  . Dizziness 11/22/2016  . Hypokalemia 11/22/2016  . Urinary frequency 09/08/2016  . Claudication ([place]) 04/24/2016  . Bilateral carotid artery disease ([place]) 08/07/2014  . Peripheral arterial disease ([place]) 08/06/2013  . GASTROPARESIS 02/25/2009  . COLONIC POLYPS, ADENOMATOUS, HX OF 02/22/2009  . HYPERCHOLESTEROLEMIA 09/26/2007  . Essential hypertension 09/26/2007  . Coronary atherosclerosis 09/26/2007  . ALLERGIC RHINITIS 09/26/2007  . GERD 09/26/2007  . Peptic ulcer 09/26/2007  . UMBILICAL HERNIA 21/19/4174  . NEPHROLITHIASIS 09/26/2007  . BLADDER STONE 09/26/2007    Past Surgical History:  Procedure Laterality Date  . AORTOGRAM  04/24/2016    Abdominal aortogram, bilateral iliac angiogram, bifemoral runoff  . CARDIAC CATHETERIZATION  05/12/2005   RCA  . carotid doppler  01/24/2013   RICA endarterectomy,left CCA 0-49%; left bulb and prox ICA 50-69%; bilaateral subclavian < 50%  . CAROTID ENDARTERECTOMY  11/01/2010  . CATARACT EXTRACTION    . CORONARY ANGIOPLASTY  05/18/2005   2 STENTS distal RCA AND PROXIMAL-MID RCA  . DOPPLER  ECHOCARDIOGRAPHY  02/07/2012   EF 55%,SHOWED NO ISCHEMIA   . lower arterial  doppler  02/04/2013   aotra 1.5 x 1.5 cm; distal abd aorta 70-99%,proximal common iliac arteries -very stenotic with increased velocities>50%,may be falsely elevated as a result of residual plaque from the distal aorta stenosis  . lower extremity doppler  June 18 ,2013   ABI'S ABNORMAL, RABI was 0.88 and LABI 0.75 ,with 3-vessel  run off  . NM MYOVIEW LTD  MAY [id number]   showed no significant ischemia;  . NM MYOVIEW LTD  04/22/2008   ef  77%,exercise capcity 6 METS ,exaggerated blood pressure response to exercise  . PERIPHERAL VASCULAR CATHETERIZATION N/A 04/24/2016   Procedure: Lower Extremity Angiography;  Surgeon: Lorretta Harp, MD;  Location: [facility];  Service: Cardiovascular;  Laterality: N/A;  . PERIPHERAL VASCULAR CATHETERIZATION  04/24/2016   Procedure: Peripheral Vascular Intervention;  Surgeon: Lorretta Harp, MD;  Location: [facility];  Service: Cardiovascular;;  Aorta  . retrograde central aortic catheterization  05/19/2005   cutting balloon atherectomy, c-circ stenosis with DES STENTING CYPHER  . TONSILLECTOMY    . TRANSURETHRAL RESECTION OF PROSTATE N/A 09/08/2016   Procedure: TRANSURETHRAL RESECTION OF THE PROSTATE (TURP);  Surgeon: Ardis Hughs, MD;  Location: [facility];  Service: Urology;  Laterality: N/A;       Home Medications    Prior to Admission medications   Medication Sig Start Date End Date Taking? Authorizing Provider  amLODipine (NORVASC) 5 MG tablet Take 5 mg by mouth daily.     Yes Historical Provider, MD  aspirin 81 MG tablet Take 81 mg by mouth daily.     Yes Historical Provider, MD  atenolol (TENORMIN) 50 MG tablet Take 50 mg by mouth daily.     Yes Historical Provider, MD  atorvastatin (LIPITOR) 40 MG tablet Take 40 mg by mouth daily at 6 PM.  11/14/16  Yes Historical Provider, MD  Biotin 5000 MCG CAPS Take 5,000 mcg by mouth daily.   Yes Historical Provider, MD  Calcium Carbonate-Vitamin D (CALCIUM 600+D PO) Take 1 tablet by mouth daily.   Yes Historical Provider, MD  Cholecalciferol (VITAMIN D3) 1000 units CAPS Take 1,000 Units by mouth daily.   Yes Historical Provider, MD  cilostazol (PLETAL) 100 MG tablet Take 100 mg by mouth daily.  11/14/16  Yes Historical Provider, MD  ciprofloxacin (CIPRO) 500 MG tablet Take 500 mg by mouth 2 (two) times daily.  11/21/16  Yes Historical Provider, MD  clopidogrel (PLAVIX) 75 MG tablet Take 1 tablet (75 mg total) by mouth at  bedtime. 09/18/16  Yes Festus Aloe, MD  fish oil-omega-3 fatty acids 1000 MG capsule Take 2 g by mouth daily.     Yes Historical Provider, MD  GLUCOSAMINE PO Take 1,500 mg by mouth daily.    Yes Historical Provider, MD  hydrochlorothiazide 25 MG tablet Take 25 mg by mouth daily.     Yes Historical Provider, MD  losartan (COZAAR) 50 MG tablet Take 50 mg by mouth daily.   Yes Historical Provider, MD  Multiple Vitamin (MULTIVITAMIN PO) Take 1 tablet by mouth daily.    Yes Historical Provider, MD  omeprazole (PRILOSEC) 20 MG capsule  11/14/16  Yes Historical Provider, MD  pantoprazole (PROTONIX) 40 MG tablet Take 1 tablet (40 mg total) by mouth daily. 04/25/16  Yes Arbutus Leas, NP  potassium chloride SA (K-DUR,KLOR-CON) 20 MEQ tablet Take 20 mEq by mouth daily.  07/16/13  Yes  Historical Provider, MD  vitamin C (ASCORBIC ACID) 500 MG tablet Take 500 mg by mouth daily.   Yes Historical Provider, MD  benzonatate (TESSALON) 100 MG capsule Take 100 mg by mouth 3 (three) times daily as needed for cough.  08/02/14   Historical Provider, MD  Carboxymethylcellul-Glycerin (LUBRICATING EYE DROPS OP) Apply 1 drop to eye daily as needed (dry eyes).    Historical Provider, MD  diazepam (VALIUM) 5 MG tablet Take 5 mg by mouth every 8 (eight) hours as needed for anxiety.    Historical Provider, MD  Melatonin 5 MG CAPS Take 5 mg by mouth at bedtime as needed (sleep).    Historical Provider, MD  traMADol (ULTRAM) 50 MG tablet Take 1-2 tablets (50-100 mg total) by mouth every 6 (six) hours as needed for moderate pain. 09/08/16   Ardis Hughs, MD    Family History Family History  Problem Relation Age of Onset  . Ovarian cancer Sister   . Heart disease Father   . Brain cancer Brother     Tumor    Social History Social History  Substance Use Topics  . Smoking status: Former Smoker    Quit date: 07/31/1968  . Smokeless tobacco: Never Used  . Alcohol use 2.4 oz/week    4 Glasses of wine per week      Comment: OCASIONAL     Allergies   Lisinopril and Niaspan [niacin er]   Review of Systems Review of Systems  Neurological: Positive for weakness.  All other systems reviewed and are negative.    Physical Exam Updated Vital Signs BP (!) 180/72 (BP Location: Right Arm)   Pulse 94   Temp 97.7 F (36.5 C) (Oral)   Resp (!) 25   SpO2 94%   Physical Exam  Constitutional: He is oriented to person, place, and time. He appears well-developed and well-nourished. No distress.  HENT:  Head: Normocephalic and atraumatic.  Neck:  Carotid bruit on right.   Cardiovascular: Normal rate, regular rhythm and normal heart sounds.   No murmur heard. Pulmonary/Chest: Effort normal and breath sounds normal. No respiratory distress.  Abdominal: Soft. Bowel sounds are normal. He exhibits no distension. There is no tenderness.  Musculoskeletal:  No midline C/T/L spine tenderness. No CVA tenderness.   Neurological: He is alert and oriented to person, place, and time.  Alert, oriented, thought content appropriate, able to give a coherent history. Speech is clear and goal oriented, able to follow commands.  Cranial Nerves:  II:  Peripheral visual fields grossly normal, pupils equal, round, reactive to light III, IV, VI: EOM intact bilaterally, ptosis not present V,VII: smile symmetric, eyes kept closed tightly against resistance, facial light touch sensation equal VIII: hearing grossly normal IX, X: symmetric soft palate movement, uvula elevates symmetrically  XI: bilateral shoulder shrug symmetric and strong XII: midline tongue extension 5/5 muscle strength in upper and lower extremities bilaterally including strong and equal grip strength and dorsiflexion/plantar flexion Sensory to light touch normal in all four extremities.  Normal finger-to-nose and rapid alternating movements;  Negative romberg, no pronator drift. Gait not assessed - unable to ambulate.   Skin: Skin is warm and dry.    Nursing note and vitals reviewed.    ED Treatments / Results  Labs (all labs ordered are listed, but only abnormal results are displayed) Labs Reviewed  CBC WITH DIFFERENTIAL/PLATELET - Abnormal; Notable for the following:       Result Value   WBC 11.4 (*)  RBC 4.19 (*)    Hemoglobin 12.1 (*)    HCT 36.3 (*)    Neutro Abs 9.8 (*)    All other components within normal limits  COMPREHENSIVE METABOLIC PANEL - Abnormal; Notable for the following:    Potassium 3.4 (*)    Glucose, Bld 121 (*)    BUN 28 (*)    All other components within normal limits  URINALYSIS, ROUTINE W REFLEX MICROSCOPIC - Abnormal; Notable for the following:    APPearance CLOUDY (*)    Hgb urine dipstick LARGE (*)    Protein, ur 30 (*)    Leukocytes, UA LARGE (*)    Bacteria, UA RARE (*)    Squamous Epithelial / LPF 0-5 (*)    All other components within normal limits  URINE CULTURE  CULTURE, BLOOD (ROUTINE X 2)  CULTURE, BLOOD (ROUTINE X 2)  TROPONIN I  HEMOGLOBIN A1C  LIPID PANEL  PROTIME-INR    EKG  EKG Interpretation None       Radiology Ct Head Wo Contrast  Result Date: 11/22/2016 CLINICAL DATA:  Generalized weakness and dizziness since 3 p.m. today. EXAM: CT HEAD WITHOUT CONTRAST TECHNIQUE: Contiguous axial images were obtained from the base of the skull through the vertex without intravenous contrast. COMPARISON:  None. FINDINGS: BRAIN: There is mild sulcal and ventricular prominence consistent with superficial and central atrophy. No intraparenchymal hemorrhage, mass effect nor midline shift. Periventricular and subcortical white matter hypodensities consistent with chronic small vessel ischemic disease are identified. No acute large vascular territory infarcts. No abnormal extra-axial fluid collections. Basal cisterns are not effaced and midline. VASCULAR: Moderate calcific atherosclerosis of the carotid siphons and both vertebral arteries. No hyperdense vessels or unexpected  calcifications. SKULL: No skull fracture. No significant scalp soft tissue swelling. There appears be a small arachnoid granulation near the vertex of the skull on the right. SINUSES/ORBITS: The mastoid air-cells are clear. The included paranasal sinuses are well-aerated.The included ocular globes and orbital contents are non-suspicious. OTHER: None. IMPRESSION: Chronic appearing small vessel ischemic disease periventricular white matter. No acute intracranial abnormality. Cerebral atrophy. Electronically Signed   By: Ashley Royalty M.D.   On: 11/22/2016 18:41    Procedures Procedures (including critical care time)  Medications Ordered in ED Medications  atorvastatin (LIPITOR) tablet 40 mg (not administered)  cilostazol (PLETAL) tablet 100 mg (not administered)  ciprofloxacin (CIPRO) tablet 500 mg (not administered)  pantoprazole (PROTONIX) EC tablet 40 mg (not administered)  Biotin CAPS 5,000 mcg (not administered)  Calcium Carbonate-Vitamin D 600-200 MG-UNIT TABS 1 tablet (not administered)  Carboxymethylcellul-Glycerin 0.5-0.9 % SOLN 1 drop (not administered)  Vitamin D3 CAPS 1,000 Units (not administered)  clopidogrel (PLAVIX) tablet 75 mg (not administered)  Melatonin CAPS 5 mg (not administered)  traMADol (ULTRAM) tablet 50 mg (not administered)  vitamin C (ASCORBIC ACID) tablet 500 mg (not administered)  benzonatate (TESSALON) capsule 100 mg (not administered)  diazepam (VALIUM) tablet 5 mg (not administered)  losartan (COZAAR) tablet 50 mg (not administered)  amLODipine (NORVASC) tablet 5 mg (not administered)  aspirin tablet 81 mg (not administered)  atenolol (TENORMIN) tablet 50 mg (not administered)  fish oil-omega-3 fatty acids capsule 2 g (not administered)  Glucosamine CAPS 1,500 mg (not administered)  hydrochlorothiazide (HYDRODIURIL) tablet 25 mg (not administered)  MULTIVITAMIN LIQD (not administered)  potassium chloride 20 MEQ/15ML (10%) solution 20 mEq (not administered)   hydrALAZINE (APRESOLINE) injection 5 mg (not administered)  meclizine (ANTIVERT) tablet 12.5 mg (not administered)   stroke: mapping our early  stages of recovery book (not administered)  0.9 %  sodium chloride infusion (not administered)  acetaminophen (TYLENOL) tablet 650 mg (not administered)    Or  acetaminophen (TYLENOL) solution 650 mg (not administered)    Or  acetaminophen (TYLENOL) suppository 650 mg (not administered)  senna-docusate (Senokot-S) tablet 1 tablet (not administered)  enoxaparin (LOVENOX) injection 40 mg (not administered)  ondansetron (ZOFRAN) injection 4 mg (not administered)  zolpidem (AMBIEN) tablet 5 mg (not administered)     Initial Impression / Assessment and Plan / ED Course  I have reviewed the triage vital signs and the nursing notes.  Pertinent labs & imaging results that were available during my care of the patient were reviewed by me and considered in my medical decision making (see chart for details).    MARDELL CRAGG is a 83 y.o. male who presents to ED for generalized weakness which began acutely today around 3 pm. Wife at bedside state that patient was able to ambulate independently without difficulty yesterday, but could not even lift himself out of bed today. Known carotid disease. ? TIA about a month ago which he did not seek care for. Saw urology yesterday for low back pain and was started on cipro for UTI - only had 2 doses - last night and this morning. UA today still infected with large leuks and TNTC white cells - cx sent. Labs reviewed with mild leukocytosis at 11.4 and stable anemia (hemoglobin 12.1). Troponin negative. EKG nonischemic. CT head negative. Nursing staff attempted to ambulate patient, however he took one step was very unsteady and could not go any further. Given inability to ambulate on his own, known carotid disease with possible TIA, ongoing UTI, hospitalist consulted who will admit for further evaluation of weakness.    Patient discussed with Dr. Thomasene Lot who agrees with treatment plan.    Final Clinical Impressions(s) / ED Diagnoses   Final diagnoses:  Urinary tract infection with hematuria, site unspecified  Generalized weakness    New Prescriptions New Prescriptions   No medications on file     [facility], PA-C 11/22/16 [place], MD 11/28/16 1654

## 2016-11-22 NOTE — ED Notes (Signed)
Pt wasn't able to stand with-out help and pt couldn't take a step forward

## 2016-11-22 NOTE — ED Notes (Signed)
Bed: WA01 Expected date:  Expected time:  Means of arrival:  Comments: 

## 2016-11-22 NOTE — Telephone Encounter (Signed)
New Message    Pt is having dizziness and weakness, to the point he could not get out of bed

## 2016-11-22 NOTE — H&P (Signed)
History and Physical    Nathan Santiago  Referring MD/NP/PA:   PCP: Nathan Stack, MD   Patient coming from:  The patient is coming from SNF.  At baseline, pt is dependent for most of ADL.  Chief Complaint: dizziness and unsteady gait  HPI: Nathan Santiago is a 83 y.o. male with medical history significant of carotid artery disease (s/p of CAE 2012), PVD, claudication, hypertension, hyperlipidemia, GERD, anxiety, gastroparesis, CAD, s/p of stent placement, AAA, who presents with dizziness and unsteady gait.  Pt states that he starting feeling dizzy since 3:00 PM today.  He also has generalized weakness and unsteady gait. He cannot walk normally. He denies unilateral weakness, numbness or tingliness in extremities. No vision change. He states that he has chronic right ear ringing, which has not changed today. He feels room spinning when standing up sometimes.  Patient denies chest pain, SOB, cough, fever, chills. No nausea, vomiting, diarrhea or abdominal pain. He states that he has mild lower back pain, no incontinence of urine or bowel movement. He states that he was diagnosed as UTI yesterday by urologist, and was started with ciprofloxacin. He still has mild burning on urination, no dysuria or increased urinary frequency.  ED Course: pt was found to have positive urinalysis with large amount of leukocytes, WBC 11.4, negative troponin, potassium is 3.4, creatinine normal, temperature normal, oxygen saturation 90-94% on room air, blood pressure 180/72. CT head is negative for acute intracranial abnormalities. Patient is placed on telemetry bed for observation.  Review of Systems:   General: no fevers, chills, no changes in body weight, has fatigue HEENT: no blurry vision, hearing changes or sore throat Respiratory: no dyspnea, coughing, wheezing CV: no chest pain, no palpitations GI: no nausea, vomiting, abdominal pain, diarrhea,  constipation GU: no dysuria, has burning on urination, no increased urinary frequency, hematuria  Ext: no leg edema Neuro: no unilateral weakness, numbness, or tingling, no vision change. Has dizziness, R ear ringing. Skin: no rash, no skin tear. MSK: No muscle spasm, no deformity, no limitation of range of movement in spin Heme: No easy bruising.  Travel history: No recent long distant travel.  Allergy:  Allergies  Allergen Reactions  . Lisinopril Cough  . Niaspan [Niacin Er] Rash    Past Medical History:  Diagnosis Date  . AAA (abdominal aortic aneurysm) (HCC)    asymptomatic  . AAA (abdominal aortic aneurysm) ([place]) 2010   peripheral  angiogram-- bilateral SFA DISEASE  and 60 to 70% infrarenaal abd. aortic stenosis with 15 -mm gradient  . Adenomatous colon polyp 11/2003  . CAD (coronary artery disease)   . Gastroparesis   . GERD (gastroesophageal reflux disease)    w/ LPR  . Hyperlipidemia   . Hypertension   . Peripheral arterial disease ([place])    RCEA  by Dr Lemar Livings  . PUD (peptic ulcer disease)     Past Surgical History:  Procedure Laterality Date  . AORTOGRAM  04/24/2016    Abdominal aortogram, bilateral iliac angiogram, bifemoral runoff  . CARDIAC CATHETERIZATION  05/12/2005   RCA  . carotid doppler  01/24/2013   RICA endarterectomy,left CCA 0-49%; left bulb and prox ICA 50-69%; bilaateral subclavian < 50%  . CAROTID ENDARTERECTOMY  11/01/2010  . CATARACT EXTRACTION    . CORONARY ANGIOPLASTY  05/18/2005   2 STENTS distal RCA AND PROXIMAL-MID RCA  . DOPPLER ECHOCARDIOGRAPHY  02/07/2012   EF 55%,SHOWED NO ISCHEMIA   . lower arterial  doppler  02/04/2013   aotra 1.5 x 1.5 cm; distal abd aorta 70-99%,proximal common iliac arteries -very stenotic with increased velocities>50%,may be falsely elevated as a result of residual plaque from the distal aorta stenosis  . lower extremity doppler  June 18 ,2013   ABI'S ABNORMAL, RABI was 0.88 and LABI 0.75 ,with 3-vessel   run off  . NM MYOVIEW LTD  MAY [id number]   showed no significant ischemia;  . NM MYOVIEW LTD  04/22/2008   ef 77%,exercise capcity 6 METS ,exaggerated blood pressure response to exercise  . PERIPHERAL VASCULAR CATHETERIZATION N/A 04/24/2016   Procedure: Lower Extremity Angiography;  Surgeon: Lorretta Harp, MD;  Location: [facility];  Service: Cardiovascular;  Laterality: N/A;  . PERIPHERAL VASCULAR CATHETERIZATION  04/24/2016   Procedure: Peripheral Vascular Intervention;  Surgeon: Lorretta Harp, MD;  Location: [facility];  Service: Cardiovascular;;  Aorta  . retrograde central aortic catheterization  05/19/2005   cutting balloon atherectomy, c-circ stenosis with DES STENTING CYPHER  . TONSILLECTOMY    . TRANSURETHRAL RESECTION OF PROSTATE N/A 09/08/2016   Procedure: TRANSURETHRAL RESECTION OF THE PROSTATE (TURP);  Surgeon: Ardis Hughs, MD;  Location: [facility];  Service: Urology;  Laterality: N/A;    Social History:  reports that he quit smoking about 48 years ago. He has never used smokeless tobacco. He reports that he drinks about 2.4 oz of alcohol per week . He reports that he does not use drugs.  Family History:  Family History  Problem Relation Age of Onset  . Ovarian cancer Sister   . Heart disease Father   . Brain cancer Brother     Tumor     Prior to Admission medications   Medication Sig Start Date End Date Taking? Authorizing Provider  amLODipine (NORVASC) 5 MG tablet Take 5 mg by mouth daily.     Yes Historical Provider, MD  aspirin 81 MG tablet Take 81 mg by mouth daily.     Yes Historical Provider, MD  atenolol (TENORMIN) 50 MG tablet Take 50 mg by mouth daily.     Yes Historical Provider, MD  atorvastatin (LIPITOR) 40 MG tablet Take 40 mg by mouth daily at 6 PM.  11/14/16  Yes Historical Provider, MD  Biotin 5000 MCG CAPS Take 5,000 mcg by mouth daily.   Yes Historical Provider, MD  Calcium Carbonate-Vitamin D (CALCIUM 600+D PO) Take 1 tablet by  mouth daily.   Yes Historical Provider, MD  Cholecalciferol (VITAMIN D3) 1000 units CAPS Take 1,000 Units by mouth daily.   Yes Historical Provider, MD  cilostazol (PLETAL) 100 MG tablet Take 100 mg by mouth daily.  11/14/16  Yes Historical Provider, MD  ciprofloxacin (CIPRO) 500 MG tablet Take 500 mg by mouth 2 (two) times daily.  11/21/16  Yes Historical Provider, MD  clopidogrel (PLAVIX) 75 MG tablet Take 1 tablet (75 mg total) by mouth at bedtime. 09/18/16  Yes Festus Aloe, MD  fish oil-omega-3 fatty acids 1000 MG capsule Take 2 g by mouth daily.     Yes Historical Provider, MD  GLUCOSAMINE PO Take 1,500 mg by mouth daily.    Yes Historical Provider, MD  hydrochlorothiazide 25 MG tablet Take 25 mg by mouth daily.     Yes Historical Provider, MD  losartan (COZAAR) 50 MG tablet Take 50 mg by mouth daily.   Yes Historical Provider, MD  Multiple Vitamin (MULTIVITAMIN PO) Take 1 tablet by mouth daily.    Yes Historical Provider,  MD  omeprazole (PRILOSEC) 20 MG capsule  11/14/16  Yes Historical Provider, MD  pantoprazole (PROTONIX) 40 MG tablet Take 1 tablet (40 mg total) by mouth daily. 04/25/16  Yes Arbutus Leas, NP  potassium chloride SA (K-DUR,KLOR-CON) 20 MEQ tablet Take 20 mEq by mouth daily.  07/16/13  Yes Historical Provider, MD  vitamin C (ASCORBIC ACID) 500 MG tablet Take 500 mg by mouth daily.   Yes Historical Provider, MD  benzonatate (TESSALON) 100 MG capsule Take 100 mg by mouth 3 (three) times daily as needed for cough.  08/02/14   Historical Provider, MD  Carboxymethylcellul-Glycerin (LUBRICATING EYE DROPS OP) Apply 1 drop to eye daily as needed (dry eyes).    Historical Provider, MD  diazepam (VALIUM) 5 MG tablet Take 5 mg by mouth every 8 (eight) hours as needed for anxiety.    Historical Provider, MD  Melatonin 5 MG CAPS Take 5 mg by mouth at bedtime as needed (sleep).    Historical Provider, MD  traMADol (ULTRAM) 50 MG tablet Take 1-2 tablets (50-100 mg total) by mouth every 6 (six)  hours as needed for moderate pain. 09/08/16   Ardis Hughs, MD    Physical Exam: Vitals:   11/22/16 1740 11/22/16 1857 11/22/16 2051  BP: (!) 158/56 (!) 150/66 (!) 180/72  Pulse: 85 81 94  Resp: 18 16 (!) 25  Temp: 97.7 F (36.5 C)    TempSrc: Oral    SpO2: 93% 90% 94%   General: Not in acute distress HEENT:       Eyes: PERRL, EOMI, no scleral icterus.       ENT: No discharge from the ears and nose, no pharynx injection, no tonsillar enlargement.        Neck: No JVD, no bruit, no mass felt. Heme: No neck lymph node enlargement. Cardiac: S1/S2, RRR, No murmurs, No gallops or rubs. Respiratory: No rales, wheezing, rhonchi or rubs. GI: Soft, nondistended, nontender, no rebound pain, no organomegaly, BS present. GU: No hematuria Ext: No pitting leg edema bilaterally. 2+DP/PT pulse bilaterally. Musculoskeletal: No joint deformities, No joint redness or warmth, no limitation of ROM in spin. Skin: No rashes.  Neuro: Alert, oriented X3, cranial nerves II-XII grossly intact, moves all extremities normally. Muscle strength 5/5 in all extremities, sensation to light touch intact. Brachial reflex 2+ bilaterally. Knee reflex 1+ bilaterally. Negative Babinski's sign. Normal finger to nose test. Has unsteady gait. Psych: Patient is not psychotic, no suicidal or hemocidal ideation.  Labs on Admission: I have personally reviewed following labs and imaging studies  CBC:  Recent Labs Lab 11/22/16 1814  WBC 11.4*  NEUTROABS 9.8*  HGB 12.1*  HCT 36.3*  MCV 86.6  PLT 324   Basic Metabolic Panel:  Recent Labs Lab 11/22/16 1814  NA 139  K 3.4*  CL 104  CO2 27  GLUCOSE 121*  BUN 28*  CREATININE 0.99  CALCIUM 9.2   GFR: CrCl cannot be calculated (Unknown ideal weight.). Liver Function Tests:  Recent Labs Lab 11/22/16 1814  AST 22  ALT 24  ALKPHOS 72  BILITOT 0.6  PROT 7.6  ALBUMIN 3.7   No results for input(s): LIPASE, AMYLASE in the last 168 hours. No results for  input(s): AMMONIA in the last 168 hours. Coagulation Profile: No results for input(s): INR, PROTIME in the last 168 hours. Cardiac Enzymes:  Recent Labs Lab 11/22/16 1814  TROPONINI <0.03   BNP (last 3 results) No results for input(s): PROBNP in the last 8760 hours.  HbA1C: No results for input(s): HGBA1C in the last 72 hours. CBG: No results for input(s): GLUCAP in the last 168 hours. Lipid Profile: No results for input(s): CHOL, HDL, LDLCALC, TRIG, CHOLHDL, LDLDIRECT in the last 72 hours. Thyroid Function Tests: No results for input(s): TSH, T4TOTAL, FREET4, T3FREE, THYROIDAB in the last 72 hours. Anemia Panel: No results for input(s): VITAMINB12, FOLATE, FERRITIN, TIBC, IRON, RETICCTPCT in the last 72 hours. Urine analysis:    Component Value Date/Time   COLORURINE YELLOW 11/22/2016 1815   APPEARANCEUR CLOUDY (A) 11/22/2016 1815   LABSPEC 1.011 11/22/2016 1815   PHURINE 5.0 11/22/2016 1815   GLUCOSEU NEGATIVE 11/22/2016 1815   HGBUR LARGE (A) 11/22/2016 1815   BILIRUBINUR NEGATIVE 11/22/2016 1815   KETONESUR NEGATIVE 11/22/2016 1815   PROTEINUR 30 (A) 11/22/2016 1815   UROBILINOGEN 0.2 10/28/2010 0909   NITRITE NEGATIVE 11/22/2016 1815   LEUKOCYTESUR LARGE (A) 11/22/2016 1815   Sepsis Labs: @LABRCNTIP (procalcitonin:4,lacticidven:4) )No results found for this or any previous visit (from the past 240 hour(s)).   Radiological Exams on Admission: Ct Head Wo Contrast  Result Date: Santiago CLINICAL DATA:  Generalized weakness and dizziness since 3 p.m. today. EXAM: CT HEAD WITHOUT CONTRAST TECHNIQUE: Contiguous axial images were obtained from the base of the skull through the vertex without intravenous contrast. COMPARISON:  None. FINDINGS: BRAIN: There is mild sulcal and ventricular prominence consistent with superficial and central atrophy. No intraparenchymal hemorrhage, mass effect nor midline shift. Periventricular and subcortical white matter hypodensities consistent  with chronic small vessel ischemic disease are identified. No acute large vascular territory infarcts. No abnormal extra-axial fluid collections. Basal cisterns are not effaced and midline. VASCULAR: Moderate calcific atherosclerosis of the carotid siphons and both vertebral arteries. No hyperdense vessels or unexpected calcifications. SKULL: No skull fracture. No significant scalp soft tissue swelling. There appears be a small arachnoid granulation near the vertex of the skull on the right. SINUSES/ORBITS: The mastoid air-cells are clear. The included paranasal sinuses are well-aerated.The included ocular globes and orbital contents are non-suspicious. OTHER: None. IMPRESSION: Chronic appearing small vessel ischemic disease periventricular white matter. No acute intracranial abnormality. Cerebral atrophy. Electronically Signed   By: Ashley Royalty M.D.   On: 11/22/2016 18:41     EKG: Independently reviewed.  Sinus rhythm, QTC 42, diffuse T-wave flattening.   Assessment/Plan Principal Problem:   Dizziness Active Problems:   HYPERCHOLESTEROLEMIA   Essential hypertension   Coronary atherosclerosis   GERD   Peptic ulcer   Peripheral arterial disease (HCC)   Bilateral carotid artery disease (HCC)   UTI (urinary tract infection)   Hypokalemia   Dizziness and unsteady gait: Etiology is not clear. Differential diagnoses include TIA/stroke (pt has very significant risk factors for stroke), orthostatic status and inner ear issues such as PPV given ear ringing.   -will place on tele bed for obs -will get MRI of brain to r/o stroke -get carotid doppler -We'll check orthostatic vital signs -IVF with a: 100 mL per hour normal saline -PT/OT  Hx of PAD, claudication, carotid artery stenosis: s/p of CEA. Pt also had peripheral Vascular Intervention 04/24/16 per. Dr. Maree Krabbe and PA, Junie Panning Smith's note on 04/25/16 as below: 1. Abdominal aortogram, bilateral iliac angiogram, bifemoral  runoff 2. Contralateral access, selective angiogram left SFA and oblique view (second order catheter placement) 3. PTA and stent distal abdominal aorta using balloon-expandable stent -will continue ASA, plavix, cilostazole and lipitor.  HLD: -continue lipitor  HTN: -continue losartan, amlodipine, atenolol -Continue HCTZ starting tomorrow morning -IV  hydralazine when necessary  CAD: s/p of stent. No CP -continue ASA, Plavix, Lipitor, atenolol  GERD and hx of PUD: -protonix  Hypokalemia: K=3.4 on admission. - Repleted  UTI (urinary tract infection): -continue cipro -f/u Ux and Bx.  DVT ppx: SQ Lovenox Code Status: Full code Family Communication: None at bed side.   Disposition Plan:  Anticipate discharge back to previous SNF environment Consults called:  none Admission status: Obs / tele    Date of Service Santiago    Ivor Costa [facility] Pager [phone]  If 7PM-7AM, please contact night-coverage [email or web address] Password [facility] Santiago, 9:55 PM

## 2016-11-22 NOTE — ED Notes (Signed)
Pt transferred to MRI

## 2016-11-22 NOTE — Telephone Encounter (Signed)
Spoke with pt he states that at 2pm he had a spell-dizziness and weakness, no chest pain or pressure, will call nurse at [facility] to get more info.  Spoke with Cheryl-pt's nurse today she states that pt dizzy and weak, also very unsteady with his gait today, states that pt had to be helped back to his room because of the unsteady gait-this is new for him. Per nurse lungs are clear to , pt has intermittent dry cough for a couple of weeks now, nurse states that she does not hear irregular HR. BP 164/56 HR 76. Per nurse no other symptoms denies any chest pain or SOB. Nurse states that she has notified pt's PCP dr [place] nurse Lattie Haw and has not heard anything back yet.  Please advise

## 2016-11-23 ENCOUNTER — Observation Stay (HOSPITAL_COMMUNITY): Payer: Medicare Other

## 2016-11-23 ENCOUNTER — Observation Stay (HOSPITAL_BASED_OUTPATIENT_CLINIC_OR_DEPARTMENT_OTHER): Payer: Medicare Other

## 2016-11-23 DIAGNOSIS — Z7982 Long term (current) use of aspirin: Secondary | ICD-10-CM | POA: Diagnosis not present

## 2016-11-23 DIAGNOSIS — R2681 Unsteadiness on feet: Secondary | ICD-10-CM | POA: Diagnosis present

## 2016-11-23 DIAGNOSIS — Z955 Presence of coronary angioplasty implant and graft: Secondary | ICD-10-CM | POA: Diagnosis not present

## 2016-11-23 DIAGNOSIS — Z8711 Personal history of peptic ulcer disease: Secondary | ICD-10-CM | POA: Diagnosis not present

## 2016-11-23 DIAGNOSIS — R42 Dizziness and giddiness: Secondary | ICD-10-CM

## 2016-11-23 DIAGNOSIS — I739 Peripheral vascular disease, unspecified: Secondary | ICD-10-CM | POA: Diagnosis present

## 2016-11-23 DIAGNOSIS — Z888 Allergy status to other drugs, medicaments and biological substances status: Secondary | ICD-10-CM | POA: Diagnosis not present

## 2016-11-23 DIAGNOSIS — E785 Hyperlipidemia, unspecified: Secondary | ICD-10-CM | POA: Diagnosis present

## 2016-11-23 DIAGNOSIS — I6522 Occlusion and stenosis of left carotid artery: Secondary | ICD-10-CM | POA: Diagnosis present

## 2016-11-23 DIAGNOSIS — Z9849 Cataract extraction status, unspecified eye: Secondary | ICD-10-CM | POA: Diagnosis not present

## 2016-11-23 DIAGNOSIS — I2510 Atherosclerotic heart disease of native coronary artery without angina pectoris: Secondary | ICD-10-CM | POA: Diagnosis present

## 2016-11-23 DIAGNOSIS — Z7901 Long term (current) use of anticoagulants: Secondary | ICD-10-CM | POA: Diagnosis not present

## 2016-11-23 DIAGNOSIS — R531 Weakness: Secondary | ICD-10-CM | POA: Diagnosis not present

## 2016-11-23 DIAGNOSIS — K279 Peptic ulcer, site unspecified, unspecified as acute or chronic, without hemorrhage or perforation: Secondary | ICD-10-CM | POA: Diagnosis present

## 2016-11-23 DIAGNOSIS — M5126 Other intervertebral disc displacement, lumbar region: Secondary | ICD-10-CM | POA: Diagnosis not present

## 2016-11-23 DIAGNOSIS — N390 Urinary tract infection, site not specified: Secondary | ICD-10-CM | POA: Diagnosis present

## 2016-11-23 DIAGNOSIS — Z79899 Other long term (current) drug therapy: Secondary | ICD-10-CM | POA: Diagnosis not present

## 2016-11-23 DIAGNOSIS — Z87891 Personal history of nicotine dependence: Secondary | ICD-10-CM | POA: Diagnosis not present

## 2016-11-23 DIAGNOSIS — I714 Abdominal aortic aneurysm, without rupture: Secondary | ICD-10-CM | POA: Diagnosis present

## 2016-11-23 DIAGNOSIS — E876 Hypokalemia: Secondary | ICD-10-CM | POA: Diagnosis present

## 2016-11-23 DIAGNOSIS — I10 Essential (primary) hypertension: Secondary | ICD-10-CM | POA: Diagnosis present

## 2016-11-23 DIAGNOSIS — K3184 Gastroparesis: Secondary | ICD-10-CM | POA: Diagnosis present

## 2016-11-23 DIAGNOSIS — N400 Enlarged prostate without lower urinary tract symptoms: Secondary | ICD-10-CM | POA: Diagnosis present

## 2016-11-23 DIAGNOSIS — K219 Gastro-esophageal reflux disease without esophagitis: Secondary | ICD-10-CM | POA: Diagnosis present

## 2016-11-23 DIAGNOSIS — E7800 Pure hypercholesterolemia, unspecified: Secondary | ICD-10-CM | POA: Diagnosis present

## 2016-11-23 LAB — PROTIME-INR
INR: 1.06
Prothrombin Time: 13.8 seconds (ref 11.4–15.2)

## 2016-11-23 LAB — VAS US CAROTID
LCCADDIAS: -23 cm/s
LCCADSYS: -146 cm/s
LCCAPDIAS: 26 cm/s
LCCAPSYS: 127 cm/s
LEFT ECA DIAS: -18 cm/s
LEFT VERTEBRAL DIAS: 25 cm/s
LICADDIAS: -16 cm/s
LICADSYS: -85 cm/s
LICAPSYS: 420 cm/s
Left ICA prox dias: 79 cm/s
RCCAPSYS: 135 cm/s
RIGHT ECA DIAS: 34 cm/s
RIGHT VERTEBRAL DIAS: 3 cm/s
Right CCA prox dias: 17 cm/s
Right cca dist sys: -114 cm/s

## 2016-11-23 LAB — LIPID PANEL
CHOL/HDL RATIO: 2.4 ratio
Cholesterol: 108 mg/dL (ref 0–200)
HDL: 45 mg/dL (ref >40)
LDL CALC: 44 mg/dL (ref 0–99)
Triglycerides: 94 mg/dL (ref <150)
VLDL: 19 mg/dL (ref 0–40)

## 2016-11-23 NOTE — Progress Notes (Signed)
PROGRESS NOTE    Vasco Chong [facility]  VZC:[id number] DOB: [birth year] DOA: 11/22/2016 PCP: [name], MD     Brief Narrative:  Nathan Santiago is a 83 y.o. male with medical history significant of carotid artery disease (s/p of CAE right 2012), PVD, claudication, hypertension, hyperlipidemia, GERD, anxiety, gastroparesis, CAD, s/p of stent placement, AAA, who presents with dizziness and unsteady gait.   Assessment & Plan:   Principal Problem:   Dizziness Active Problems:   HYPERCHOLESTEROLEMIA   Essential hypertension   Coronary atherosclerosis   GERD   Peptic ulcer   Peripheral arterial disease (HCC)   Bilateral carotid artery disease (HCC)   UTI (urinary tract infection)   Hypokalemia   Dizziness and unsteady gait -Unclear etiology -CT head and MRI head unremarkable -Carotid doppler with left ICA stenosis 60-79% but stable from previous -Orthostatic VS negative  -PT to eval -Will also obtain lumbar MRI with reported hx of low back pain  PAD s/p distal abdominal aortic stent, carotid artery stenosis s/p right CEA -Follows Dr. Gwenlyn Found  -Continue aspirin, plavix, cilostazole, lipitor  HLD -Continue lipitor   HTN -Continue losartan, amlodipine, atenolol, HCTZ  CAD s/p stent -Continue aspirin, Plavix, lipitor, atenolol -Stable without chest pain  GERD, hx PUD -Continue protonix   UTI, POA -Dx as outpatient -Continue cipro   BPH -S/p TURP 08/2016   DVT prophylaxis: lovenox Code Status: Full Family Communication: wife at bedside Disposition Plan: pending improvement, back to independent living    Consultants:   None  Procedures:   None  Antimicrobials:   Cipro    Subjective: Patient denies dizziness currently. Wants to get out of bed to walk. No other new complaints, no chest pain, SOB, N/V.    Objective: Vitals:   11/23/16 0126 11/23/16 0553 11/23/16 0752 11/23/16 1030  BP: (!) 153/60 (!) 167/69  (!) 175/61  Pulse:  89  81    Resp:  20  20  Temp: 99.9 F (37.7 C) 100 F (37.8 C) 97.5 F (36.4 C) 98.1 F (36.7 C)  TempSrc: Oral Oral  Oral  SpO2: 98% 96%  95%  Weight:      Height:        Intake/Output Summary (Last 24 hours) at 11/23/16 1322 Last data filed at 11/23/16 0600  Gross per 24 hour  Intake           613.33 ml  Output              375 ml  Net           238.33 ml   Filed Weights   11/22/16 2336  Weight: 77.6 kg (171 lb 1.2 oz)    Examination:  General exam: Appears calm and comfortable  Respiratory system: Clear to auscultation. Respiratory effort normal. Cardiovascular system: S1 & S2 heard, RRR. No JVD, murmurs, rubs, gallops or clicks. No pedal edema. Gastrointestinal system: Abdomen is nondistended, soft and nontender. No organomegaly or masses felt. Normal bowel sounds heard. Central nervous system: Alert and oriented. No focal neurological deficits. Extremities: Symmetric, +2 patellar reflex bilaterally, +5/5 strength lower extremities bilaterally  Skin: No rashes, lesions or ulcers Psychiatry: Judgement and insight appear normal. Mood & affect appropriate.   Data Reviewed: I have personally reviewed following labs and imaging studies  CBC:  Recent Labs Lab 11/22/16 1814  WBC 11.4*  NEUTROABS 9.8*  HGB 12.1*  HCT 36.3*  MCV 86.6  PLT 128   Basic Metabolic Panel:  Recent Labs Lab 11/22/16 1814  NA 139  K 3.4*  CL 104  CO2 27  GLUCOSE 121*  BUN 28*  CREATININE 0.99  CALCIUM 9.2   GFR: Estimated Creatinine Clearance: 53.3 mL/min (by C-G formula based on SCr of 0.99 mg/dL). Liver Function Tests:  Recent Labs Lab 11/22/16 1814  AST 22  ALT 24  ALKPHOS 72  BILITOT 0.6  PROT 7.6  ALBUMIN 3.7   No results for input(s): LIPASE, AMYLASE in the last 168 hours. No results for input(s): AMMONIA in the last 168 hours. Coagulation Profile:  Recent Labs Lab 11/22/16 0005  INR 1.06   Cardiac Enzymes:  Recent Labs Lab 11/22/16 1814  TROPONINI <0.03    BNP (last 3 results) No results for input(s): PROBNP in the last 8760 hours. HbA1C: No results for input(s): HGBA1C in the last 72 hours. CBG: No results for input(s): GLUCAP in the last 168 hours. Lipid Profile:  Recent Labs  11/23/16 0700  CHOL 108  HDL 45  LDLCALC 44  TRIG 94  CHOLHDL 2.4   Thyroid Function Tests: No results for input(s): TSH, T4TOTAL, FREET4, T3FREE, THYROIDAB in the last 72 hours. Anemia Panel: No results for input(s): VITAMINB12, FOLATE, FERRITIN, TIBC, IRON, RETICCTPCT in the last 72 hours. Sepsis Labs: No results for input(s): PROCALCITON, LATICACIDVEN in the last 168 hours.  No results found for this or any previous visit (from the past 240 hour(s)).     Radiology Studies: Ct Head Wo Contrast  Result Date: 11/22/2016 CLINICAL DATA:  Generalized weakness and dizziness since 3 p.m. today. EXAM: CT HEAD WITHOUT CONTRAST TECHNIQUE: Contiguous axial images were obtained from the base of the skull through the vertex without intravenous contrast. COMPARISON:  None. FINDINGS: BRAIN: There is mild sulcal and ventricular prominence consistent with superficial and central atrophy. No intraparenchymal hemorrhage, mass effect nor midline shift. Periventricular and subcortical white matter hypodensities consistent with chronic small vessel ischemic disease are identified. No acute large vascular territory infarcts. No abnormal extra-axial fluid collections. Basal cisterns are not effaced and midline. VASCULAR: Moderate calcific atherosclerosis of the carotid siphons and both vertebral arteries. No hyperdense vessels or unexpected calcifications. SKULL: No skull fracture. No significant scalp soft tissue swelling. There appears be a small arachnoid granulation near the vertex of the skull on the right. SINUSES/ORBITS: The mastoid air-cells are clear. The included paranasal sinuses are well-aerated.The included ocular globes and orbital contents are non-suspicious. OTHER:  None. IMPRESSION: Chronic appearing small vessel ischemic disease periventricular white matter. No acute intracranial abnormality. Cerebral atrophy. Electronically Signed   By: Ashley Royalty M.D.   On: 11/22/2016 18:41   [name]  Result Date: 11/22/2016 CLINICAL DATA:  Weakness and fatigue EXAM: MRI HEAD WITHOUT CONTRAST TECHNIQUE: Multiplanar, multiecho pulse sequences of the brain and surrounding structures were obtained without intravenous contrast. COMPARISON:  Head CT 11/22/2016 FINDINGS: Brain: The midline structures are normal. No focal diffusion restriction to indicate acute infarct. No intraparenchymal hemorrhage. There is beginning confluent hyperintense T2-weighted signal within the periventricular white matter, most often seen in the setting of chronic microvascular ischemia. No mass lesion. No chronic microhemorrhage or cerebral amyloid angiopathy. No hydrocephalus, age advanced atrophy or lobar predominant volume loss. No dural abnormality or extra-axial collection. Vascular: Major intracranial arterial and venous sinus flow voids are preserved. Skull and upper cervical spine: The visualized skull base, calvarium, upper cervical spine and extracranial soft tissues are normal. Sinuses/Orbits: No fluid levels or advanced mucosal thickening. No mastoid effusion. Normal orbits. IMPRESSION: Chronic microvascular disease without  acute intracranial abnormality. Electronically Signed   By: Ulyses Jarred M.D.   On: 11/22/2016 22:35      Scheduled Meds: . amLODipine  5 mg Oral Daily  . aspirin EC  81 mg Oral Daily  . atenolol  50 mg Oral Daily  . atorvastatin  40 mg Oral q1800  . calcium-vitamin D  1 tablet Oral Daily  . cholecalciferol  1,000 Units Oral Daily  . cilostazol  100 mg Oral Daily  . ciprofloxacin  500 mg Oral BID  . clopidogrel  75 mg Oral QHS  . enoxaparin (LOVENOX) injection  40 mg Subcutaneous QHS  . hydrochlorothiazide  25 mg Oral Daily  . losartan  50 mg Oral  Daily  . multivitamin  15 mL Oral Daily  . omega-3 acid ethyl esters  2 g Oral Daily  . pantoprazole  40 mg Oral Daily  . vitamin C  500 mg Oral Daily   Continuous Infusions: . sodium chloride 100 mL/hr at 11/23/16 1101     LOS: 0 days    Time spent: 40 minutes   Dessa Phi, DO [facility] [email or web address] Password [email or web address] 11/23/2016, 1:22 PM

## 2016-11-23 NOTE — Care Management Obs Status (Signed)
MEDICARE OBSERVATION STATUS NOTIFICATION   Patient Details  Name: NAJEEB UPTAIN MRN: [id number] Date of Birth: 1931-01-19   Medicare Observation Status Notification Given:  Yes    MahabirJuliann Pulse, RN 11/23/2016, 9:36 AM

## 2016-11-23 NOTE — Evaluation (Signed)
Physical Therapy Evaluation Patient Details Name: Nathan Santiago MRN: [id number] DOB: [birth year] Today's Date: 11/23/2016   History of Present Illness  83 yo male admitted with dizziness, weakness, inability to ambulate. Hx of CAD, PVD, anxiety, AAA. Pt is from [place]  On eval, pt required Min assist for mobility. He walked ~100 feet in hallway. Pt presents with general weakness, decreased activity tolerance and impaired gait and balance. Pt denied dizziness during session. Discussed d/c plan-pt declines placement. He stated [facility] will [facility] with wife assisting as needed. Recommend HHPT follow up if pt is agreeable    Follow Up Recommendations Home health PT;Supervision/Assistance - 24 hour (at [facility]. Pt declines placement)    Equipment Recommendations  Rolling walker with 5" wheels    Recommendations for Other Services       Precautions / Restrictions Precautions Precautions: Fall Restrictions Weight Bearing Restrictions: No      Mobility  Bed Mobility Overal bed mobility: Needs Assistance Bed Mobility: Supine to Sit;Sit to Supine;Sit to Sidelying     Supine to sit: Min assist   Sit to sidelying: Min assist General bed mobility comments: Increased time. Pt relied on bedrail. Small amount of assist required.   Transfers Overall transfer level: Needs assistance Equipment used: Rolling walker (2 wheeled) Transfers: Sit to/from Stand Sit to Stand: Min assist         General transfer comment: Assist to rise, stabilize.   Ambulation/Gait Ambulation/Gait assistance: Min assist Ambulation Distance (Feet): 100 Feet (75' with walker; 25' without device) Assistive device: Rolling walker (2 wheeled) Gait Pattern/deviations: Step-through pattern;Decreased stride length;Decreased step length - right;Decreased step length - left     General Gait Details: Assist needed to stabilize pt and maneuver safely with  walker. Walked a short distance without an assistive device as well. Overall, Min assist both times.   Stairs            Wheelchair Mobility    Modified Rankin (Stroke Patients Only)       Balance Overall balance assessment: Needs assistance           Standing balance-Leahy Scale: Poor                               Pertinent Vitals/Pain Pain Assessment: No/denies pain    Home Living Family/patient expects to be discharged to:: Private residence Living Arrangements: Spouse/significant other Available Help at Discharge: Family Type of Home: Independent living facility Home Access: Level entry     Home Layout: One level Home Equipment: Grab bars - tub/shower;Grab bars - toilet      Prior Function Level of Independence: Independent         Comments: sometimes has difficulty feeding self with LUE due to intention tremor     Hand Dominance        Extremity/Trunk Assessment   Upper Extremity Assessment Upper Extremity Assessment: Defer to OT evaluation    Lower Extremity Assessment Lower Extremity Assessment: Generalized weakness    Cervical / Trunk Assessment Cervical / Trunk Assessment: Normal  Communication   Communication: No difficulties  Cognition Arousal/Alertness: Awake/alert Behavior During Therapy: WFL for tasks assessed/performed Overall Cognitive Status: Within Functional Limits for tasks assessed  General Comments      Exercises     Assessment/Plan    PT Assessment Patient needs continued PT services  PT Problem List Decreased strength;Decreased mobility;Decreased activity tolerance;Decreased knowledge of use of DME;Decreased balance;Pain       PT Treatment Interventions Gait training;Therapeutic exercise;Therapeutic activities;DME instruction;Patient/family education;Balance training;Functional mobility training    PT Goals (Current goals can be found in  the Care Plan section)  Acute Rehab PT Goals Patient Stated Goal: regain PLOF. home.  PT Goal Formulation: With patient Time For Goal Achievement: 12/07/16 Potential to Achieve Goals: Good    Frequency Min 3X/week   Barriers to discharge        Co-evaluation   Reason for Co-Treatment: For patient/therapist safety PT goals addressed during session: Mobility/safety with mobility OT goals addressed during session: ADL's and self-care       End of Session Equipment Utilized During Treatment: Gait belt Activity Tolerance: Patient tolerated treatment well Patient left: in bed;with call bell/phone within reach;with bed alarm set   PT Visit Diagnosis: Muscle weakness (generalized) (M62.81);Difficulty in walking, not elsewhere classified (R26.2)    Time: 9242-6834 PT Time Calculation (min) (ACUTE ONLY): 27 min   Charges:   PT Evaluation $PT Eval Low Complexity: 1 Procedure     PT G Codes:   PT G-Codes **NOT FOR INPATIENT CLASS** Functional Assessment Tool Used: AM-PAC 6 Clicks Basic Mobility;Clinical judgement Functional Limitation: Mobility: Walking and moving around Mobility: Walking and Moving Around Current Status (H9622): At least 20 percent but less than 40 percent impaired, limited or restricted Mobility: Walking and Moving Around Goal Status [phone]): At least 1 percent but less than 20 percent impaired, limited or restricted      Weston Anna, MPT Pager: [phone]

## 2016-11-23 NOTE — Evaluation (Signed)
Occupational Therapy Evaluation Patient Details Name: Nathan Santiago MRN: [id number] DOB: 1931-06-24 Today's Date: 11/23/2016    History of Present Illness 83 yo male admitted with dizziness, weakness, inability to ambulate. Hx of CAD, PVD, anxiety, AAA. Pt is from [place]   Pt was admitted for the above.  He had no c/o dizziness during evaluation and BP was WFLs.  Pt was independent prior to admission.  He currently needs min A for mobility and adls.  Goals in acute are for min guard overall.      Follow Up Recommendations  Home health OT;Supervision/Assistance - 24 hour (pt declines SNF--would benefit if agreeable)    Equipment Recommendations  None recommended by OT    Recommendations for Other Services       Precautions / Restrictions Precautions Precautions: Fall Restrictions Weight Bearing Restrictions: No      Mobility Bed Mobility Overal bed mobility: Needs Assistance Bed Mobility: Supine to Sit;Sit to Supine;Sit to Sidelying     Supine to sit: Min assist   Sit to sidelying: Min assist General bed mobility comments: Increased time. Pt relied on bedrail. Small amount of assist required.   Transfers Overall transfer level: Needs assistance Equipment used: Rolling walker (2 wheeled) Transfers: Sit to/from Stand Sit to Stand: Min assist         General transfer comment: Assist to rise, stabilize.     Balance Overall balance assessment: Needs assistance           Standing balance-Leahy Scale: Poor                             ADL either performed or assessed with clinical judgement   ADL Overall ADL's : Needs assistance/impaired   Eating/Feeding Details (indicate cue type and reason): not assessed: pt is L dominant but often uses R Grooming: Set up;Sitting   Upper Body Bathing: Set up;Sitting   Lower Body Bathing: Minimal assistance;Sit to/from stand   Upper Body Dressing : Set up;Sitting    Lower Body Dressing: Minimal assistance;Sit to/from stand   Toilet Transfer: Minimal assistance;Ambulation Toilet Transfer Details (indicate cue type and reason): back to bed.  Started with RW but then walked without it Toileting- Water quality scientist and Hygiene: Minimal assistance         General ADL Comments: cotx with PT. Pt has had intention tremors and they mostly don't interfere wtih function.  He had no dizziness and not orthostatic.       Vision         Perception     Praxis      Pertinent Vitals/Pain Pain Assessment: Faces Faces Pain Scale: Hurts a little bit Pain Location: back from lying in one position per pt Pain Descriptors / Indicators: Sore Pain Intervention(s): Limited activity within patient's tolerance;Monitored during session     Hand Dominance     Extremity/Trunk Assessment Upper Extremity Assessment Upper Extremity Assessment:  (pt with bil intention tremors L greater than R)      Cervical / Trunk Assessment Cervical / Trunk Assessment: Normal   Communication Communication Communication: No difficulties   Cognition Arousal/Alertness: Awake/alert Behavior During Therapy: WFL for tasks assessed/performed Overall Cognitive Status: Within Functional Limits for tasks assessed  General Comments       Exercises     Shoulder Instructions      Home Living Family/patient expects to be discharged to:: Private residence Living Arrangements: Spouse/significant other Available Help at Discharge: Family Type of Home: Independent living facility Home Access: Level entry     Home Layout: One level     Bathroom Shower/Tub: Occupational psychologist: Handicapped height     Home Equipment: Grab bars - tub/shower;Grab bars - toilet          Prior Functioning/Environment Level of Independence: Independent        Comments: sometimes has difficulty feeding self with LUE due to  intention tremor        OT Problem List: Decreased strength;Decreased activity tolerance;Impaired balance (sitting and/or standing);Decreased knowledge of use of DME or AE;Pain      OT Treatment/Interventions: Self-care/ADL training;DME and/or AE instruction;Balance training;Patient/family education    OT Goals(Current goals can be found in the care plan section) Acute Rehab OT Goals Patient Stated Goal: regain PLOF. home.  OT Goal Formulation: With patient Time For Goal Achievement: 11/30/16 Potential to Achieve Goals: Fair ADL Goals Pt Will Perform Eating: with adaptive utensils;sitting;with set-up (without spillage) Pt Will Perform Lower Body Bathing: with min guard assist;with adaptive equipment;sit to/from stand Pt Will Perform Lower Body Dressing: with min guard assist;with adaptive equipment;sit to/from stand Pt Will Transfer to Toilet: with min guard assist;ambulating;grab bars (high commode) Pt Will Perform Toileting - Clothing Manipulation and hygiene: with min guard assist;sit to/from stand  OT Frequency: Min 2X/week   Barriers to D/C:            Co-evaluation PT/OT/SLP Co-Evaluation/Treatment: Yes Reason for Co-Treatment: For patient/therapist safety PT goals addressed during session: Mobility/safety with mobility OT goals addressed during session: ADL's and self-care      End of Session    Activity Tolerance: Patient tolerated treatment well Patient left: in bed;with call bell/phone within reach;with bed alarm set  OT Visit Diagnosis: Unsteadiness on feet (R26.81)                Time: 2778-2423 OT Time Calculation (min): 26 min Charges:  OT General Charges $OT Visit: 1 Procedure OT Evaluation $OT Eval Low Complexity: 1 Procedure G-Codes: OT G-codes **NOT FOR INPATIENT CLASS** Functional Assessment Tool Used: Clinical judgement Functional Limitation: Self care Self Care Current Status (N3614): At least 20 percent but less than 40 percent impaired,  limited or restricted Self Care Goal Status (E3154): At least 20 percent but less than 40 percent impaired, limited or restricted   Nathan Santiago, OTR/L [phone] 11/23/2016  Nathan Santiago 11/23/2016, 3:19 PM

## 2016-11-23 NOTE — Care Management Note (Signed)
Case Management Note  Patient Details  Name: KEIAN ODRISCOLL MRN: [id number] Date of Birth: 06-11-1931  Subjective/Objective:83 y/o m admitted w/Dizziness. From [facility].Has cane. PT cons-await recc.                    Action/Plan:d/c plan home.   Expected Discharge Date:   (unknown)               Expected Discharge Plan:  Home/Self Care  In-House Referral:     Discharge planning Services  CM Consult  Post Acute Care Choice:  Durable Medical Equipment (cane) Choice offered to:     DME Arranged:    DME Agency:     HH Arranged:    HH Agency:     Status of Service:  In process, will continue to follow  If discussed at Long Length of Stay Meetings, dates discussed:    Additional Comments:  Dessa Phi, RN 11/23/2016, 9:47 AM

## 2016-11-23 NOTE — Progress Notes (Signed)
*  PRELIMINARY RESULTS* Vascular Ultrasound Carotid Duplex (Doppler) has been completed.  Preliminary findings: Right 1-39% ICA stenosis. Left 60-79% ICA stenosis. Previous study 07/8341 showed LICA in same range, although is now in higher end of range.    Landry Mellow, RDMS, RVT  11/23/2016, 11:08 AM

## 2016-11-23 NOTE — Care Management Note (Signed)
Case Management Note  Patient Details  Name: Nathan Santiago MRN: [id number] Date of Birth: 1930/11/18  Subjective/Objective: PT recc HHPT/HHOT-[facility], patient voiced understanding & agrees-will fax HHPT/OT order once available. Fax#[phone].                   Action/Plan:d/c plan home w/HHC.   Expected Discharge Date:   (unknown)               Expected Discharge Plan:  Home/Self Care  In-House Referral:     Discharge planning Services  CM Consult  Post Acute Care Choice:  Durable Medical Equipment (cane) Choice offered to:  Patient  DME Arranged:    DME Agency:     HH Arranged:    [place] Agency:  Other - See comment ([place] Contracted @ [facility].)  Status of Service:  In process, will continue to follow  If discussed at Long Length of Stay Meetings, dates discussed:    Additional Comments:  Dessa Phi, RN 11/23/2016, 3:54 PM

## 2016-11-24 LAB — CBC
HCT: 34 % — ABNORMAL LOW (ref 39.0–52.0)
HEMOGLOBIN: 11.3 g/dL — AB (ref 13.0–17.0)
MCH: 28.5 pg (ref 26.0–34.0)
MCHC: 33.2 g/dL (ref 30.0–36.0)
MCV: 85.9 fL (ref 78.0–100.0)
Platelets: 159 10*3/uL (ref 150–400)
RBC: 3.96 MIL/uL — AB (ref 4.22–5.81)
RDW: 12.9 % (ref 11.5–15.5)
WBC: 10.1 10*3/uL (ref 4.0–10.5)

## 2016-11-24 LAB — BASIC METABOLIC PANEL
Anion gap: 8 (ref 5–15)
BUN: 20 mg/dL (ref 6–20)
CALCIUM: 8.7 mg/dL — AB (ref 8.9–10.3)
CO2: 27 mmol/L (ref 22–32)
Chloride: 105 mmol/L (ref 101–111)
Creatinine, Ser: 0.81 mg/dL (ref 0.61–1.24)
GFR calc Af Amer: >60 mL/min (ref >60)
GFR calc non Af Amer: >60 mL/min (ref >60)
GLUCOSE: 118 mg/dL — AB (ref 65–99)
Potassium: 3.1 mmol/L — ABNORMAL LOW (ref 3.5–5.1)
Sodium: 140 mmol/L (ref 135–145)

## 2016-11-24 LAB — URINE CULTURE

## 2016-11-24 LAB — HEMOGLOBIN A1C
HEMOGLOBIN A1C: 5.5 % (ref 4.8–5.6)
MEAN PLASMA GLUCOSE: 111 mg/dL

## 2016-11-24 MED ORDER — POTASSIUM CHLORIDE CRYS ER 20 MEQ PO TBCR
40.0000 meq | EXTENDED_RELEASE_TABLET | ORAL | Status: AC
  Administered 2016-11-24 (×2): 40 meq via ORAL
  Filled 2016-11-24 (×2): qty 2

## 2016-11-24 NOTE — Progress Notes (Signed)
Occupational Therapy Treatment Patient Details Name: Nathan Santiago MRN: [id number] DOB: 08-17-1930 Today's Date: 11/24/2016    History of present illness 83 yo male admitted with dizziness, weakness, inability to ambulate. Hx of CAD, PVD, anxiety, AAA. Pt is from [place]   OT comments  Pt improving with steadiness.  Did as well without RW as with it this session.  AE for self feeding issued  Follow Up Recommendations  Home health OT;Supervision/Assistance - 24 hour    Equipment Recommendations  None recommended by OT    Recommendations for Other Services      Precautions / Restrictions Precautions Precautions: Fall Restrictions Weight Bearing Restrictions: No       Mobility Bed Mobility Overal bed mobility: Needs Assistance       Supine to sit: Supervision     General bed mobility comments: extra time; pt did use rail to help his trunk up, but HOB was flat  Transfers   Equipment used: None;Rolling walker (2 wheeled) Transfers: Sit to/from Stand Sit to Stand: Min guard         General transfer comment: for safety; cues for UE placement when using RW    Balance                                           ADL either performed or assessed with clinical judgement   ADL     Eating/Feeding Details (indicate cue type and reason): mod I                     Toilet Transfer: Min guard;Ambulation;Comfort height toilet (with and without RW)             General ADL Comments: Issued weighted spoon and weighted lidded mug.  pt able to self feed oatmeal and drink coffee without spillage.  He states that he and wife can [facility] meals [facility] at [facility]  He was wondering how he could carry these items to dining hall. Suggested that wife carry a small tote with them.  Pt continues to walk way behind walker despite cues.  He lifted walker to carry it at one point when making a turn.    He was much steadier today, and walked back to  bathroom a second time without RW.     Vision       Perception     Praxis      Cognition Arousal/Alertness: Awake/alert Behavior During Therapy: WFL for tasks assessed/performed Overall Cognitive Status: Within Functional Limits for tasks assessed                                          Exercises     Shoulder Instructions       General Comments      Pertinent Vitals/ Pain       Pain Assessment: No/denies pain  Home Living                                          Prior Functioning/Environment              Frequency  Min 2X/week        Progress Toward  Goals  OT Goals(current goals can now be found in the care plan section)  Progress towards OT goals: Progressing toward goals  Acute Rehab OT Goals Time For Goal Achievement: 11/30/16 Potential to Achieve Goals: Good  Plan      Co-evaluation                 End of Session    OT Visit Diagnosis: Unsteadiness on feet (R26.81)   Activity Tolerance Patient tolerated treatment well   Patient Left in chair;with call bell/phone within reach;with chair alarm set   Nurse Communication          Time: 0174-9449 OT Time Calculation (min): 30 min  Charges: OT General Charges $OT Visit: 1 Procedure OT Treatments $Self Care/Home Management : 23-37 mins  Lesle Chris, OTR/L [phone] 11/24/2016   [name] 11/24/2016, 10:22 AM

## 2016-11-24 NOTE — Care Management Note (Signed)
Case Management Note  Patient Details  Name: DOLORES MCGOVERN MRN: [id number] Date of Birth: [birth year]  Subjective/Objective:  [facility] orders,d/c summary faxed to [facility] w/confirmation. D/c back to [facility]. No further CM needs.                  Action/Plan:d/c home w/HHC.   Expected Discharge Date:  11/24/16               Expected Discharge Plan:  [place]  In-House Referral:     Discharge planning Services  CM Consult  Post Acute Care Choice:  Durable Medical Equipment (cane) Choice offered to:  Patient  DME Arranged:    DME Agency:     HH Arranged:  PT, OT HH Agency:  Other - See comment, [place] [place] ([facility]  Status of Service:  Completed, signed off  If discussed at [place] of Stay Meetings, dates discussed:    Additional Comments:  Dessa Phi, RN 11/24/2016, 11:54 AM

## 2016-11-24 NOTE — Discharge Summary (Signed)
Physician Discharge Summary  Nathan Santiago [facility] VPX:[id number] DOB: Mar 10, 1931 DOA: 11/22/2016  PCP: [name], MD  Admit date: 11/22/2016 Discharge date: 11/24/2016  Admitted From: Independent living  Disposition:  Independent living   Recommendations for Outpatient Follow-up:  1. Follow up with PCP in 1 week 2. Follow up with Dr. Gwenlyn Found regarding carotid duplex US  3. Follow up with Dr. Louis Meckel as previously scheduled  4. Please obtain BMP in 1 week   Home Health: PT OT Equipment/Devices: None   Discharge Condition: Stable CODE STATUS: Full  Diet recommendation: Heart healthy   Brief/Interim Summary: From H&P by Dr. Blaine Hamper: Nathan Santiago is a 83 y.o. male with medical history significant of carotid artery disease (s/p of CAE 2012), PVD, claudication, hypertension, hyperlipidemia, GERD, anxiety, gastroparesis, CAD, s/p of stent placement, AAA, who presents with dizziness and unsteady gait. Pt states that he starting feeling dizzy since 3:00 PM today.  He also has generalized weakness and unsteady gait. He cannot walk normally. He denies unilateral weakness, numbness or tingliness in extremities. No vision change. He states that he has chronic right ear ringing, which has not changed today. He feels room spinning when standing up sometimes. Patient denies chest pain, SOB, cough, fever, chills. No nausea, vomiting, diarrhea or abdominal pain. He states that he has mild lower back pain, no incontinence of urine or bowel movement. He states that he was diagnosed as UTI yesterday by urologist, and was started with ciprofloxacin. He still has mild burning on urination, no dysuria or increased urinary frequency.  ED Course: pt was found to have positive urinalysis with large amount of leukocytes, WBC 11.4, negative troponin, potassium is 3.4, creatinine normal, temperature normal, oxygen saturation 90-94% on room air, blood pressure 180/72. CT head is negative for acute intracranial  abnormalities. Patient is placed on telemetry bed for observation.  Interim: Patient underwent workup for his dizziness and unsteady gait, including CT head, MRI head, carotid Doppler, orthostatic vital sign, lumbar MRI which were all unremarkable and results as below. His symptoms resolved without any treatment. He was evaluated by physical therapy as well as occupational therapy who recommended home health PT and OT. Patient denied any dizziness since admission to the hospital. His exam remained unremarkable. The etiology of his gait unsteadiness is still unclear, question if this is related to his urinary tract infection.   Discharge Diagnoses:  Principal Problem:   Dizziness Active Problems:   HYPERCHOLESTEROLEMIA   Essential hypertension   Coronary atherosclerosis   GERD   Peptic ulcer   Peripheral arterial disease (HCC)   Bilateral carotid artery disease (HCC)   UTI (urinary tract infection)   Hypokalemia  Dizziness and unsteady gait -Unclear etiology -CT head and MRI head unremarkable -Carotid doppler with left ICA stenosis 60-79% but stable from previous. Follow up with Dr. Gwenlyn Found.  -Orthostatic VS negative  -Lumbar MRI with reported hx of low back pain, with degenerative disease but no acute findings -Improved without any specific treatment, dizziness resolved -PT/OT recommending HHPT, OT   PAD s/p distal abdominal aortic stent, carotid artery stenosis s/p right CEA -Follows Dr. Gwenlyn Found  -Continue aspirin, plavix, cilostazole, lipitor  HLD -Continue lipitor   HTN -Continue losartan, amlodipine, atenolol, HCTZ  CAD s/p stent -Continue aspirin, Plavix, lipitor, atenolol -Stable without chest pain  GERD, hx PUD -Continue protonix   UTI, POA -Dx as outpatient -Continue cipro   BPH -S/p TURP 08/2016   Discharge Instructions  Discharge Instructions    Call MD for:  extreme fatigue    Complete by:  As directed    Call MD for:  persistant dizziness or  light-headedness    Complete by:  As directed    Diet - low sodium heart healthy    Complete by:  As directed    Discharge instructions    Complete by:  As directed    You were cared for by a hospitalist during your hospital stay. If you have any questions about your discharge medications or the care you received while you were in the hospital after you are discharged, you can call the unit and asked to speak with the hospitalist on call if the hospitalist that took care of you is not available. Once you are discharged, your primary care physician will handle any further medical issues. Please note that NO REFILLS for any discharge medications will be authorized once you are discharged, as it is imperative that you return to your primary care physician (or establish a relationship with a primary care physician if you do not have one) for your aftercare needs so that they can reassess your need for medications and monitor your lab values.   Increase activity slowly    Complete by:  As directed      Allergies as of 11/24/2016      Reactions   Lisinopril Cough   Niaspan [niacin Er] Rash      Medication List    TAKE these medications   amLODipine 5 MG tablet Commonly known as:  NORVASC Take 5 mg by mouth daily.   aspirin 81 MG tablet Take 81 mg by mouth daily.   atenolol 50 MG tablet Commonly known as:  TENORMIN Take 50 mg by mouth daily.   atorvastatin 40 MG tablet Commonly known as:  LIPITOR Take 40 mg by mouth daily at 6 PM.   benzonatate 100 MG capsule Commonly known as:  TESSALON Take 100 mg by mouth 3 (three) times daily as needed for cough.   Biotin 5000 MCG Caps Take 5,000 mcg by mouth daily.   CALCIUM 600+D PO Take 1 tablet by mouth daily.   cilostazol 100 MG tablet Commonly known as:  PLETAL Take 100 mg by mouth daily.   ciprofloxacin 500 MG tablet Commonly known as:  CIPRO Take 500 mg by mouth 2 (two) times daily.   clopidogrel 75 MG tablet Commonly known  as:  PLAVIX Take 1 tablet (75 mg total) by mouth at bedtime.   diazepam 5 MG tablet Commonly known as:  VALIUM Take 5 mg by mouth every 8 (eight) hours as needed for anxiety.   fish oil-omega-3 fatty acids 1000 MG capsule Take 2 g by mouth daily.   GLUCOSAMINE PO Take 1,500 mg by mouth daily.   hydrochlorothiazide 25 MG tablet Commonly known as:  HYDRODIURIL Take 25 mg by mouth daily.   losartan 50 MG tablet Commonly known as:  COZAAR Take 50 mg by mouth daily.   LUBRICATING EYE DROPS OP Apply 1 drop to eye daily as needed (dry eyes).   Melatonin 5 MG Caps Take 5 mg by mouth at bedtime as needed (sleep).   MULTIVITAMIN PO Take 1 tablet by mouth daily.   omeprazole 20 MG capsule Commonly known as:  PRILOSEC   pantoprazole 40 MG tablet Commonly known as:  PROTONIX Take 1 tablet (40 mg total) by mouth daily.   potassium chloride SA 20 MEQ tablet Commonly known as:  K-DUR,KLOR-CON Take 20 mEq by mouth daily.   traMADol 50  MG tablet Commonly known as:  ULTRAM Take 1-2 tablets (50-100 mg total) by mouth every 6 (six) hours as needed for moderate pain.   vitamin C 500 MG tablet Commonly known as:  ASCORBIC ACID Take 500 mg by mouth daily.   Vitamin D3 1000 units Caps Take 1,000 Units by mouth daily.      Follow-up Information    [facility].   Why:  HH physical therapy,occupational therapy Contact information: [facility] [place], [place]. Schedule an appointment as soon as possible for a visit in 1 week(s).   Specialty:  Endocrinology Contact information: [place] [address] [phone]        [name], MD. Call in 1 week(s).   Specialties:  Cardiology, Radiology Why:  Regarding follow up for carotid duplex ultrasound result  Contact information: [address] [place] [address] [phone]        Ardis Hughs, MD Follow up.   Specialty:   Urology Why:  Follow up as previously scheduled  Contact information: [address] [place] [address] [phone]          Allergies  Allergen Reactions  . Lisinopril Cough  . Niaspan [Niacin Er] Rash    Consultations:  None   Procedures/Studies: Ct Head Wo Contrast  Result Date: 11/22/2016 CLINICAL DATA:  Generalized weakness and dizziness since 3 p.m. today. EXAM: CT HEAD WITHOUT CONTRAST TECHNIQUE: Contiguous axial images were obtained from the base of the skull through the vertex without intravenous contrast. COMPARISON:  None. FINDINGS: BRAIN: There is mild sulcal and ventricular prominence consistent with superficial and central atrophy. No intraparenchymal hemorrhage, mass effect nor midline shift. Periventricular and subcortical white matter hypodensities consistent with chronic small vessel ischemic disease are identified. No acute large vascular territory infarcts. No abnormal extra-axial fluid collections. Basal cisterns are not effaced and midline. VASCULAR: Moderate calcific atherosclerosis of the carotid siphons and both vertebral arteries. No hyperdense vessels or unexpected calcifications. SKULL: No skull fracture. No significant scalp soft tissue swelling. There appears be a small arachnoid granulation near the vertex of the skull on the right. SINUSES/ORBITS: The mastoid air-cells are clear. The included paranasal sinuses are well-aerated.The included ocular globes and orbital contents are non-suspicious. OTHER: None. IMPRESSION: Chronic appearing small vessel ischemic disease periventricular white matter. No acute intracranial abnormality. Cerebral atrophy. Electronically Signed   By: Ashley Royalty M.D.   On: 11/22/2016 18:41   [name]  Result Date: 11/22/2016 CLINICAL DATA:  Weakness and fatigue EXAM: MRI HEAD WITHOUT CONTRAST TECHNIQUE: Multiplanar, multiecho pulse sequences of the brain and surrounding structures were obtained without intravenous  contrast. COMPARISON:  Head CT 11/22/2016 FINDINGS: Brain: The midline structures are normal. No focal diffusion restriction to indicate acute infarct. No intraparenchymal hemorrhage. There is beginning confluent hyperintense T2-weighted signal within the periventricular white matter, most often seen in the setting of chronic microvascular ischemia. No mass lesion. No chronic microhemorrhage or cerebral amyloid angiopathy. No hydrocephalus, age advanced atrophy or lobar predominant volume loss. No dural abnormality or extra-axial collection. Vascular: Major intracranial arterial and venous sinus flow voids are preserved. Skull and upper cervical spine: The visualized skull base, calvarium, upper cervical spine and extracranial soft tissues are normal. Sinuses/Orbits: No fluid levels or advanced mucosal thickening. No mastoid effusion. Normal orbits. IMPRESSION: Chronic microvascular disease without acute intracranial abnormality. Electronically Signed   By: Cletus Gash.D.  On: 11/22/2016 22:35   Mr Lumbar Spine Wo Contrast  Result Date: 11/23/2016 CLINICAL DATA:  Bilateral lower extremity weakness, unsteady gait and back pain. EXAM: MRI LUMBAR SPINE WITHOUT CONTRAST TECHNIQUE: Multiplanar, multisequence MR imaging of the lumbar spine was performed. No intravenous contrast was administered. COMPARISON:  None. FINDINGS: SEGMENTATION: For the purposes of this report, the last well-formed intervertebral disc will be described as L5-S1. ALIGNMENT: Maintenance of the lumbar lordosis. No malalignment. VERTEBRAE:Vertebral bodies are intact. Mild L4-5 and L5-S1 disc height loss with mild disc desiccation and mild chronic discogenic endplate changes all lumbar levels. Tiny acute L4 inferior endplate Schmorl's node. No abnormal bone marrow signal. Moderate sacroiliac osteoarthrosis. Included thoracic spine demonstrates T11-12 annular fissure. CONUS MEDULLARIS: Conus medullaris terminates at T12-L1 and demonstrates  normal morphology and signal characteristics. Cauda equina is normal. PARASPINAL AND SOFT TISSUES: Moderate symmetric paraspinal muscle atrophy. Bilateral extrarenal pelvises. Partially imaged small LEFT renal cyst. DISC LEVELS: T12-L1: Tiny central disc protrusion and annular fissure without canal stenosis or neural foraminal narrowing. L1-2: Annular fissure. No disc bulge, canal stenosis nor neural foraminal narrowing. L2-3, L3-4: No disc bulge, canal stenosis nor neural foraminal narrowing. Mild facet arthropathy. L4-5: Small broad-based disc bulge. Moderate to severe facet arthropathy and ligamentum flavum redundancy with trace facet effusions which are likely reactive. No canal stenosis or neural foraminal narrowing. L5-S1: Small broad-based disc bulge. Mild to moderate facet arthropathy and ligamentum flavum redundancy without canal stenosis or neural foraminal narrowing. IMPRESSION: Moderate to severe L4-5 facet arthropathy. Multilevel annular fissures. No fracture or malalignment. No canal stenosis or neural foraminal narrowing. Electronically Signed   By: Elon Alas M.D.   On: 11/23/2016 21:33   Carotid US Summary: - The vertebral arteries appear patent with antegrade flow. - Findings consistent with 1-39 percent stenosis involving the   right internal carotid artery. - Findings consistent with 60 - 79 percent stenosis involving the   left internal carotid artery. - >50% Left ECA stenosis.   Discharge Exam: Vitals:   11/23/16 2012 11/24/16 0516  BP: (!) 152/54 (!) 150/61  Pulse: 83 78  Resp: 20 20  Temp: 98.5 F (36.9 C) 98.4 F (36.9 C)   Vitals:   11/23/16 1440 11/23/16 1829 11/23/16 2012 11/24/16 0516  BP: (!) 146/64 (!) 150/62 (!) 152/54 (!) 150/61  Pulse: 82 80 83 78  Resp: 18 18 20 20   Temp: 98.3 F (36.8 C) 98.5 F (36.9 C) 98.5 F (36.9 C) 98.4 F (36.9 C)  TempSrc: Oral Oral Oral Oral  SpO2: 96% 96% 93% 92%  Weight:      Height:        General: Pt is  alert, awake, not in acute distress Cardiovascular: RRR, S1/S2 +, no rubs, no gallops Respiratory: CTA bilaterally, no wheezing, no rhonchi Abdominal: Soft, NT, ND, bowel sounds + Extremities: no edema, no cyanosis, +2 pedal pulses bilaterally  CNS: non focal, bilateral patellar reflexes +2, strength equal, heel-shin symmetric    The results of significant diagnostics from this hospitalization (including imaging, microbiology, ancillary and laboratory) are listed below for reference.     Microbiology: Recent Results (from the past 240 hour(s))  Urine culture     Status: Abnormal   Collection Time: 11/22/16  7:23 PM  Result Value Ref Range Status   Specimen Description URINE, RANDOM  Final   Special Requests NONE  Final   Culture (A)  Final    <10,000 COLONIES/mL INSIGNIFICANT GROWTH Performed at [facility], [address], [place], [place] [address]    Report Status 11/24/2016 FINAL  Final  Culture, blood (Routine X 2) w Reflex to ID Panel     Status: None (Preliminary result)   Collection Time: 11/22/16 11:51 PM  Result Value Ref Range Status   Specimen Description BLOOD RIGHT HAND  Final   Special Requests IN PEDIATRIC BOTTLE Blood Culture adequate volume  Final   Culture   Final    NO GROWTH 1 DAY Performed at [facility], [place] [address], [place], [place] [address]    Report Status PENDING  Incomplete  Culture, blood (Routine X 2) w Reflex to ID Panel     Status: None (Preliminary result)   Collection Time: 11/23/16 12:05 AM  Result Value Ref Range Status   Specimen Description BLOOD LEFT ANTECUBITAL  Final   Special Requests   Final    BOTTLES DRAWN AEROBIC AND ANAEROBIC Blood Culture adequate volume   Culture   Final    NO GROWTH 1 DAY Performed at [facility], [place] [address], [place],  [address]    Report Status PENDING  Incomplete     Labs: BNP (last 3 results) No results for input(s): BNP in the last 8760 hours. Basic Metabolic  Panel:  Recent Labs Lab 11/22/16 1814 11/24/16 0552  NA 139 140  K 3.4* 3.1*  CL 104 105  CO2 27 27  GLUCOSE 121* 118*  BUN 28* 20  CREATININE 0.99 0.81  CALCIUM 9.2 8.7*   Liver Function Tests:  Recent Labs Lab 11/22/16 1814  AST 22  ALT 24  ALKPHOS 72  BILITOT 0.6  PROT 7.6  ALBUMIN 3.7   No results for input(s): LIPASE, AMYLASE in the last 168 hours. No results for input(s): AMMONIA in the last 168 hours. CBC:  Recent Labs Lab 11/22/16 1814 11/24/16 0552  WBC 11.4* 10.1  NEUTROABS 9.8*  --   HGB 12.1* 11.3*  HCT 36.3* 34.0*  MCV 86.6 85.9  PLT 172 159   Cardiac Enzymes:  Recent Labs Lab 11/22/16 1814  TROPONINI <0.03   BNP: Invalid input(s): POCBNP CBG: No results for input(s): GLUCAP in the last 168 hours. D-Dimer No results for input(s): DDIMER in the last 72 hours. Hgb A1c  Recent Labs  11/23/16 0700  HGBA1C 5.5   Lipid Profile  Recent Labs  11/23/16 0700  CHOL 108  HDL 45  LDLCALC 44  TRIG 94  CHOLHDL 2.4   Thyroid function studies No results for input(s): TSH, T4TOTAL, T3FREE, THYROIDAB in the last 72 hours.  Invalid input(s): FREET3 Anemia work up No results for input(s): VITAMINB12, FOLATE, FERRITIN, TIBC, IRON, RETICCTPCT in the last 72 hours. Urinalysis    Component Value Date/Time   COLORURINE YELLOW 11/22/2016 1815   APPEARANCEUR CLOUDY (A) 11/22/2016 1815   LABSPEC 1.011 11/22/2016 1815   PHURINE 5.0 11/22/2016 1815   GLUCOSEU NEGATIVE 11/22/2016 1815   HGBUR LARGE (A) 11/22/2016 1815   BILIRUBINUR NEGATIVE 11/22/2016 1815   KETONESUR NEGATIVE 11/22/2016 1815   PROTEINUR 30 (A) 11/22/2016 1815   UROBILINOGEN 0.2 10/28/2010 0909   NITRITE NEGATIVE 11/22/2016 1815   LEUKOCYTESUR LARGE (A) 11/22/2016 1815   Sepsis Labs Invalid input(s): PROCALCITONIN,  WBC,  LACTICIDVEN Microbiology Recent Results (from the past 240 hour(s))  Urine culture     Status: Abnormal   Collection Time: 11/22/16  7:23 PM  Result  Value Ref Range Status   Specimen Description URINE, RANDOM  Final   Special  Requests NONE  Final   Culture (A)  Final    <10,000 COLONIES/mL INSIGNIFICANT GROWTH Performed at [facility], [place] [address] [place], [place] [address]    Report Status 11/24/2016 FINAL  Final  Culture, blood (Routine X 2) w Reflex to ID Panel     Status: None (Preliminary result)   Collection Time: 11/22/16 11:51 PM  Result Value Ref Range Status   Specimen Description BLOOD RIGHT HAND  Final   Special Requests IN PEDIATRIC BOTTLE Blood Culture adequate volume  Final   Culture   Final    NO GROWTH 1 DAY Performed at [facility], [place] [address], [place], [place] [address]    Report Status PENDING  Incomplete  Culture, blood (Routine X 2) w Reflex to ID Panel     Status: None (Preliminary result)   Collection Time: 11/23/16 12:05 AM  Result Value Ref Range Status   Specimen Description BLOOD LEFT ANTECUBITAL  Final   Special Requests   Final    BOTTLES DRAWN AEROBIC AND ANAEROBIC Blood Culture adequate volume   Culture   Final    NO GROWTH 1 DAY Performed at [facility], [place] [address], [place],  [address]    Report Status PENDING  Incomplete     Time coordinating discharge: 40 minutes  SIGNED:  Dessa Phi, DO [facility] Pager [phone]  If 7PM-7AM, please contact night-coverage [email or web address] Password [email or web address] 11/24/2016, 1:08 PM

## 2016-11-24 NOTE — Progress Notes (Signed)
Physical Therapy Treatment Patient Details Name: Nathan Santiago MRN: [id number] DOB: 04/08/1931 Today's Date: 11/24/2016    History of Present Illness 83 yo male admitted with dizziness, weakness, inability to ambulate. Hx of CAD, PVD, anxiety, AAA. Pt is from [place]    PT Comments    Continuing to progress with mobility. He walked ~125 feet without an assistive device. Mildly unsteady. He tolerated distance well. Wife was present during session. Recommended that she provide assist as needed but also close supervision for safety reasons. Continue to recommend HHPT follow up. Encouraged pt to use a walker if/when needed.     Follow Up Recommendations  Home health PT;Supervision/Assistance - 24 hour     Equipment Recommendations  None recommended by PT (pt and wife felt they would have access to walkers at [facility] if needed)    Recommendations for Other Services       Precautions / Restrictions Precautions Precautions: Fall Restrictions Weight Bearing Restrictions: No    Mobility  Bed Mobility Overal bed mobility: Needs Assistance       Supine to sit: Supervision     General bed mobility comments: oob  Transfers Overall transfer level: Needs assistance Equipment used: None;Rolling walker (2 wheeled) Transfers: Sit to/from Stand Sit to Stand: Min guard         General transfer comment: close guard for safety. no assist given.  Ambulation/Gait Ambulation/Gait assistance: Min guard Ambulation Distance (Feet): 125 Feet Assistive device: None Gait Pattern/deviations: Step-through pattern;Decreased stride length;Decreased step length - right;Decreased step length - left     General Gait Details: Cues for increased step length intermittently. Unsteady at times but no overt LOB during session. He tolerated distance well.    Stairs            Wheelchair Mobility    Modified Rankin (Stroke Patients Only)       Balance                                             Cognition Arousal/Alertness: Awake/alert Behavior During Therapy: WFL for tasks assessed/performed Overall Cognitive Status: Within Functional Limits for tasks assessed                                        Exercises General Exercises - Lower Extremity Hip ABduction/ADduction: AROM;Both;10 reps;Standing Hip Flexion/Marching: AROM;Both;10 reps;Standing Heel Raises: AROM;Both;10 reps;Standing Mini-Sqauts: AROM;10 reps;Standing    General Comments        Pertinent Vitals/Pain Pain Assessment: No/denies pain    Home Living                      Prior Function            PT Goals (current goals can now be found in the care plan section) Progress towards PT goals: Progressing toward goals    Frequency    Min 3X/week      PT Plan Current plan remains appropriate    Co-evaluation             End of Session Equipment Utilized During Treatment: Gait belt Activity Tolerance: Patient tolerated treatment well Patient left: in chair;with call bell/phone within reach;with family/visitor present;with chair alarm set   PT Visit Diagnosis: Muscle weakness (generalized) (M62.81);Difficulty in walking,  not elsewhere classified (R26.2)     Time: 5956-3875 PT Time Calculation (min) (ACUTE ONLY): 15 min  Charges:  $Gait Training: 8-22 mins                    G Codes:          Weston Anna, MPT Pager: [phone]

## 2016-11-26 NOTE — Telephone Encounter (Signed)
Needs to be addressed by his PCP

## 2016-11-27 NOTE — Telephone Encounter (Signed)
Pt notified, verbalizes understanding he states that he has appt Monday 12-04-16 and will discuss with [facility] at that time

## 2016-11-28 LAB — CULTURE, BLOOD (ROUTINE X 2)
CULTURE: NO GROWTH
Culture: NO GROWTH
SPECIAL REQUESTS: ADEQUATE
Special Requests: ADEQUATE

## 2016-12-04 DIAGNOSIS — C61 Malignant neoplasm of prostate: Secondary | ICD-10-CM | POA: Diagnosis not present

## 2016-12-04 DIAGNOSIS — N3001 Acute cystitis with hematuria: Secondary | ICD-10-CM | POA: Diagnosis not present

## 2016-12-04 DIAGNOSIS — R310 Gross hematuria: Secondary | ICD-10-CM | POA: Diagnosis not present

## 2016-12-13 ENCOUNTER — Ambulatory Visit (HOSPITAL_COMMUNITY)
Admission: RE | Admit: 2016-12-13 | Discharge: 2016-12-13 | Disposition: A | Discharge status: Home / Self Care | Payer: Medicare Other | Source: Ambulatory Visit | Attending: Cardiovascular Disease | Admitting: Cardiovascular Disease

## 2016-12-13 DIAGNOSIS — I2510 Atherosclerotic heart disease of native coronary artery without angina pectoris: Secondary | ICD-10-CM | POA: Diagnosis not present

## 2016-12-13 DIAGNOSIS — I739 Peripheral vascular disease, unspecified: Secondary | ICD-10-CM | POA: Insufficient documentation

## 2016-12-13 DIAGNOSIS — E785 Hyperlipidemia, unspecified: Secondary | ICD-10-CM | POA: Diagnosis not present

## 2016-12-13 DIAGNOSIS — Z87891 Personal history of nicotine dependence: Secondary | ICD-10-CM | POA: Insufficient documentation

## 2016-12-13 DIAGNOSIS — I10 Essential (primary) hypertension: Secondary | ICD-10-CM | POA: Insufficient documentation

## 2016-12-13 DIAGNOSIS — R938 Abnormal findings on diagnostic imaging of other specified body structures: Secondary | ICD-10-CM | POA: Diagnosis not present

## 2016-12-13 DIAGNOSIS — I708 Atherosclerosis of other arteries: Secondary | ICD-10-CM | POA: Diagnosis not present

## 2016-12-15 ENCOUNTER — Other Ambulatory Visit: Payer: Self-pay | Admitting: Cardiovascular Disease

## 2016-12-15 DIAGNOSIS — L570 Actinic keratosis: Secondary | ICD-10-CM | POA: Diagnosis not present

## 2016-12-15 DIAGNOSIS — I739 Peripheral vascular disease, unspecified: Secondary | ICD-10-CM

## 2016-12-15 DIAGNOSIS — L304 Erythema intertrigo: Secondary | ICD-10-CM | POA: Diagnosis not present

## 2016-12-15 DIAGNOSIS — D1801 Hemangioma of skin and subcutaneous tissue: Secondary | ICD-10-CM | POA: Diagnosis not present

## 2016-12-15 DIAGNOSIS — D225 Melanocytic nevi of trunk: Secondary | ICD-10-CM | POA: Diagnosis not present

## 2016-12-15 DIAGNOSIS — L821 Other seborrheic keratosis: Secondary | ICD-10-CM | POA: Diagnosis not present

## 2016-12-15 DIAGNOSIS — L812 Freckles: Secondary | ICD-10-CM | POA: Diagnosis not present

## 2016-12-15 DIAGNOSIS — D2261 Melanocytic nevi of right upper limb, including shoulder: Secondary | ICD-10-CM | POA: Diagnosis not present

## 2016-12-23 DIAGNOSIS — I739 Peripheral vascular disease, unspecified: Secondary | ICD-10-CM | POA: Diagnosis not present

## 2016-12-23 DIAGNOSIS — I6529 Occlusion and stenosis of unspecified carotid artery: Secondary | ICD-10-CM | POA: Diagnosis not present

## 2016-12-23 DIAGNOSIS — N390 Urinary tract infection, site not specified: Secondary | ICD-10-CM | POA: Diagnosis not present

## 2016-12-23 DIAGNOSIS — I10 Essential (primary) hypertension: Secondary | ICD-10-CM | POA: Diagnosis not present

## 2016-12-23 DIAGNOSIS — R269 Unspecified abnormalities of gait and mobility: Secondary | ICD-10-CM | POA: Diagnosis not present

## 2016-12-23 DIAGNOSIS — Z6829 Body mass index (BMI) 29.0-29.9, adult: Secondary | ICD-10-CM | POA: Diagnosis not present

## 2017-02-13 ENCOUNTER — Ambulatory Visit (INDEPENDENT_AMBULATORY_CARE_PROVIDER_SITE_OTHER): Payer: Medicare Other | Admitting: Neurology

## 2017-02-13 ENCOUNTER — Encounter: Payer: Self-pay | Admitting: Neurology

## 2017-02-13 VITALS — BP 135/60 | HR 73 | Ht 64.0 in | Wt 176.0 lb

## 2017-02-13 DIAGNOSIS — E538 Deficiency of other specified B group vitamins: Secondary | ICD-10-CM

## 2017-02-13 DIAGNOSIS — R269 Unspecified abnormalities of gait and mobility: Secondary | ICD-10-CM

## 2017-02-13 DIAGNOSIS — R42 Dizziness and giddiness: Secondary | ICD-10-CM | POA: Diagnosis not present

## 2017-02-13 HISTORY — DX: Unspecified abnormalities of gait and mobility: R26.9

## 2017-02-13 NOTE — Progress Notes (Signed)
Reason for visit: Dizziness, gait disorder  Referring physician: Dr. Raechel Chute Nathan Santiago is a 83 y.o. male  History of present illness:  Nathan Santiago is an 83 year old left-handed white male with a history of vertigo that he claims his been present off and on since 1966. The patient indicates that his episodes of vertigo are more common in the spring and fall, he takes low-dose diazepam, 2.5 mg, and this will help his sensations of vertigo and help his balance. The patient went in to the hospital on 11/22/2016 with leg weakness, problems with walking, and low back pain. The patient was noted to have a urinary tract infection, he was placed on Cipro and his leg weakness improved within about 10 days. The patient has had ongoing problems with some gait instability that is worse than usual since April 2018, he will have good days and bad days with walking and when the gait is unstable he will have a tendency to veer to the left. He does have bilateral hearing aids, decreased hearing and ringing in the ears bilaterally. He has not had any change in hearing recently, he has not had any speech or swallowing problems or visual changes or double vision. The patient did undergo MRI evaluation of the brain in April 2018 and this showed a mild level small vessel disease. No acute changes were seen. No evidence of brainstem ischemia was seen. The patient has undergone MRI of the low back as well that did not show any evidence of significant spinal stenosis. The patient reports no numbness of the extremities, he does have some neck and low back pain without radiation down into the extremities. He denies problems controlling the bowels or the bladder, he denies any memory problems. He has not had any blackout episodes. He reports no recent falls, he does not use a cane or a walker for ambulation. He is sent to this office for further evaluation. He has undergone vestibular rehabilitation in the past which has  been helpful.  Past Medical History:  Diagnosis Date  . AAA (abdominal aortic aneurysm) ([facility])    asymptomatic  . AAA (abdominal aortic aneurysm) ([facility]) 2010   peripheral  angiogram-- bilateral SFA DISEASE  and 60 to 70% infrarenaal abd. aortic stenosis with 15 -mm gradient  . Adenomatous colon polyp 11/2003  . CAD (coronary artery disease)   . Gastroparesis   . GERD (gastroesophageal reflux disease)    w/ LPR  . Hyperlipidemia   . Hypertension   . Peripheral arterial disease ([facility])    RCEA  by Dr Lemar Livings  . PUD (peptic ulcer disease)   . Vertigo     Past Surgical History:  Procedure Laterality Date  . AORTOGRAM  04/24/2016    Abdominal aortogram, bilateral iliac angiogram, bifemoral runoff  . CARDIAC CATHETERIZATION  05/12/2005   RCA  . carotid doppler  01/24/2013   RICA endarterectomy,left CCA 0-49%; left bulb and prox ICA 50-69%; bilaateral subclavian < 50%  . CAROTID ENDARTERECTOMY  11/01/2010  . CATARACT EXTRACTION    . CORONARY ANGIOPLASTY  05/18/2005   2 STENTS distal RCA AND PROXIMAL-MID RCA  . DOPPLER ECHOCARDIOGRAPHY  02/07/2012   EF 55%,SHOWED NO ISCHEMIA   . lower arterial  doppler  02/04/2013   aotra 1.5 x 1.5 cm; distal abd aorta 70-99%,proximal common iliac arteries -very stenotic with increased velocities>50%,may be falsely elevated as a result of residual plaque from the distal aorta stenosis  . lower extremity doppler  June  18 ,2013   ABI'S ABNORMAL, RABI was 0.88 and LABI 0.75 ,with 3-vessel  run off  . NM MYOVIEW LTD  MAY [id number]   showed no significant ischemia;  . NM MYOVIEW LTD  04/22/2008   ef 77%,exercise capcity 6 METS ,exaggerated blood pressure response to exercise  . PERIPHERAL VASCULAR CATHETERIZATION N/A 04/24/2016   Procedure: Lower Extremity Angiography;  Surgeon: Lorretta Harp, MD;  Location: [facility];  Service: Cardiovascular;  Laterality: N/A;  . PERIPHERAL VASCULAR CATHETERIZATION  04/24/2016   Procedure: Peripheral Vascular  Intervention;  Surgeon: Lorretta Harp, MD;  Location: [facility];  Service: Cardiovascular;;  Aorta  . retrograde central aortic catheterization  05/19/2005   cutting balloon atherectomy, c-circ stenosis with DES STENTING CYPHER  . TONSILLECTOMY    . TRANSURETHRAL RESECTION OF PROSTATE N/A 09/08/2016   Procedure: TRANSURETHRAL RESECTION OF THE PROSTATE (TURP);  Surgeon: Ardis Hughs, MD;  Location: [facility];  Service: Urology;  Laterality: N/A;    Family History  Problem Relation Age of Onset  . Ovarian cancer Sister   . Heart disease Father   . Brain cancer Brother        Tumor    Social history:  reports that he quit smoking about 48 years ago. He has never used smokeless tobacco. He reports that he drinks about 2.4 oz of alcohol per week . He reports that he does not use drugs.  Medications:  Prior to Admission medications   Medication Sig Start Date End Date Taking? Authorizing Provider  amLODipine (NORVASC) 5 MG tablet Take 5 mg by mouth daily.     Yes [provider]  aspirin 81 MG tablet Take 81 mg by mouth daily.     Yes [provider]  atenolol (TENORMIN) 50 MG tablet Take 50 mg by mouth daily.     Yes [provider]  atorvastatin (LIPITOR) 40 MG tablet Take 40 mg by mouth daily at 6 PM.  11/14/16  Yes [provider]  benzonatate (TESSALON) 100 MG capsule Take 100 mg by mouth 3 (three) times daily as needed for cough.  08/02/14  Yes [provider]  Biotin 5000 MCG CAPS Take 5,000 mcg by mouth daily.   Yes [provider]  Calcium Carbonate-Vitamin D (CALCIUM 600+D PO) Take 1 tablet by mouth daily.   Yes [provider]  Carboxymethylcellul-Glycerin (LUBRICATING EYE DROPS OP) Apply 1 drop to eye daily as needed (dry eyes).   Yes [provider]  Cholecalciferol (VITAMIN D3) 1000 units CAPS Take 1,000 Units by mouth daily.   Yes [provider]  cilostazol (PLETAL) 100 MG tablet Take  100 mg by mouth daily.  11/14/16  Yes [provider]  ciprofloxacin (CIPRO) 500 MG tablet Take 500 mg by mouth 2 (two) times daily.  11/21/16  Yes [provider]  clopidogrel (PLAVIX) 75 MG tablet Take 1 tablet (75 mg total) by mouth at bedtime. 09/18/16  Yes Festus Aloe, MD  diazepam (VALIUM) 5 MG tablet Take 5 mg by mouth every 8 (eight) hours as needed for anxiety.   Yes [provider]  fish oil-omega-3 fatty acids 1000 MG capsule Take 2 g by mouth daily.     Yes [provider]  GLUCOSAMINE PO Take 1,500 mg by mouth daily.    Yes [provider]  hydrochlorothiazide 25 MG tablet Take 25 mg by mouth daily.     Yes [provider]  losartan (COZAAR) 50  MG tablet Take 50 mg by mouth daily.   Yes [provider]  Melatonin 5 MG CAPS Take 5 mg by mouth at bedtime as needed (sleep).   Yes [provider]  Multiple Vitamin (MULTIVITAMIN PO) Take 1 tablet by mouth daily.    Yes [provider]  omeprazole (PRILOSEC) 20 MG capsule  11/14/16  Yes [provider]  pantoprazole (PROTONIX) 40 MG tablet Take 1 tablet (40 mg total) by mouth daily. 04/25/16  Yes Jettie Booze E, NP  potassium chloride SA (K-DUR,KLOR-CON) 20 MEQ tablet Take 20 mEq by mouth daily.  07/16/13  Yes [provider]  traMADol (ULTRAM) 50 MG tablet Take 1-2 tablets (50-100 mg total) by mouth every 6 (six) hours as needed for moderate pain. 09/08/16  Yes Ardis Hughs, MD  vitamin C (ASCORBIC ACID) 500 MG tablet Take 500 mg by mouth daily.   Yes [provider]      Allergies  Allergen Reactions  . Lisinopril Cough  . Niaspan [Niacin Er] Rash    ROS:  Out of a complete 14 system review of symptoms, the patient complains only of the following symptoms, and all other reviewed systems are negative.  Dizziness, tremor  Blood pressure 135/60, pulse 73, height 5\' 4"  (1.626 m), weight 176 lb (79.8 kg), SpO2 96  %.  Physical Exam  General: The patient is alert and cooperative at the time of the examination.  Eyes: Pupils are equal, round, and reactive to light. Discs are flat bilaterally.  Ears: Tympanic membranes are clear bilaterally.  Neck: The neck is supple, no carotid bruits are noted.  Respiratory: The respiratory examination is clear.  Cardiovascular: The cardiovascular examination reveals a regular rate and rhythm, no obvious murmurs or rubs are noted.  Skin: Extremities are without significant edema.  Neurologic Exam  Mental status: The patient is alert and oriented x 3 at the time of the examination. The patient has apparent normal recent and remote memory, with an apparently normal attention span and concentration ability.  Cranial nerves: Facial symmetry is present. There is good sensation of the face to pinprick and soft touch bilaterally. The strength of the facial muscles and the muscles to head turning and shoulder shrug are normal bilaterally. Speech is well enunciated, no aphasia or dysarthria is noted. Extraocular movements are full. Visual fields are full. The tongue is midline, and the patient has symmetric elevation of the soft palate. No obvious hearing deficits are noted.  Motor: The motor testing reveals 5 over 5 strength of all 4 extremities. Good symmetric motor tone is noted throughout.  Sensory: Sensory testing is intact to pinprick, soft touch, vibration sensation, and position sense on all 4 extremities. No evidence of extinction is noted.  Coordination: Cerebellar testing reveals good finger-nose-finger and heel-to-shin bilaterally. The patient does have an intention tremor with finger-nose-finger bilaterally.  Gait and station: Gait is normal, the patient is able to ambulate without assistance. Tandem gait is slightly unsteady. Romberg is negative. No drift is seen.  Reflexes: Deep tendon reflexes are symmetric and normal bilaterally. Toes are downgoing  bilaterally.   MRI brain 11/22/16:  IMPRESSION: Chronic microvascular disease without acute intracranial Abnormality.  * MRI scan images were reviewed online. I agree with the written report.   MRI lumbar 11/23/16:  IMPRESSION: Moderate to severe L4-5 facet arthropathy. Multilevel annular fissures.  No fracture or malalignment. No canal stenosis or neural foraminal Narrowing.  * MRI scan images were reviewed online. I agree with  the written report.   Assessment/Plan:  1. History of episodic vertigo  2. Gait instability  The episodes of gait instability with a tendency to veer to the left likely are related to his chronic history of inner ear disease. The patient may have episodes of vertigo when he rolls in bed or first lies down, lasting less than 1 minute. The patient is having days where his walking is fairly normal, he claims that he feels good on the day of this evaluation. The patient has had a recent MRI of the brain that was unremarkable. The patient will undergo blood work today, he will be set up for vestibular rehabilitation, he will follow-up in 3-4 months.  Jill Alexanders MD 02/13/2017 11:28 AM  [facility] [address] [place], [place] [address]  Phone [phone] Fax [phone]

## 2017-02-13 NOTE — Patient Instructions (Signed)
   We will check blood work today and get Vestibular rehab.

## 2017-02-14 ENCOUNTER — Telehealth: Payer: Self-pay | Admitting: *Deleted

## 2017-02-14 LAB — VITAMIN B12: Vitamin B-12: 583 pg/mL (ref 232–1245)

## 2017-02-14 LAB — SEDIMENTATION RATE: Sed Rate: 9 mm/hr (ref 0–30)

## 2017-02-14 LAB — RPR: RPR Ser Ql: NONREACTIVE

## 2017-02-14 NOTE — Telephone Encounter (Signed)
Called and spoke with pt/wife about unremarkable labs per CW,MD note. They verbalized understanding.  Advised them to call if they do not hear about scheduling for vestibular rehab.

## 2017-02-14 NOTE — Telephone Encounter (Signed)
-----   Message from Kathrynn Ducking, MD sent at 02/14/2017  1:00 PM EDT -----  The blood work results are unremarkable. Please call the patient.  ----- Message ----- From: Lavone Neri Lab Results In Sent: 02/14/2017  11:56 AM To: Kathrynn Ducking, MD

## 2017-02-20 ENCOUNTER — Ambulatory Visit: Payer: PRIVATE HEALTH INSURANCE | Admitting: Neurology

## 2017-03-07 ENCOUNTER — Ambulatory Visit: Payer: Medicare Other | Attending: Neurology | Admitting: Rehabilitative and Restorative Service Providers"

## 2017-03-07 DIAGNOSIS — R2689 Other abnormalities of gait and mobility: Secondary | ICD-10-CM

## 2017-03-07 DIAGNOSIS — R42 Dizziness and giddiness: Secondary | ICD-10-CM | POA: Diagnosis not present

## 2017-03-07 DIAGNOSIS — R2681 Unsteadiness on feet: Secondary | ICD-10-CM | POA: Diagnosis not present

## 2017-03-07 NOTE — Patient Instructions (Signed)
Gaze Stabilization - Tip Card  1.Target must remain in focus, not blurry, and appear stationary while head is in motion. 2.Perform exercises with small head movements (45 to either side of midline). 3.Increase speed of head motion so long as target is in focus. 4.If you wear eyeglasses, be sure you can see target through lens (therapist will give specific instructions for bifocal / progressive lenses). 5.These exercises may provoke dizziness or nausea. Work through these symptoms. If too dizzy, slow head movement slightly. Rest between each exercise. 6.Exercises demand concentration; avoid distractions. 7.For safety, perform standing exercises close to a counter, wall, corner, or next to someone.  Copyright  VHI. All rights reserved.   Gaze Stabilization - Standing Feet Apart   Feet shoulder width apart, keeping eyes on target on wall 3 feet away, tilt head down slightly and move head side to side for 30 seconds. Do 2-3 sessions per day.   Copyright  VHI. All rights reserved.   Feet Partial Heel-Toe, Head Motion - Eyes Open    With eyes open, right foot partially in front of the other, move head slowly: side to side. Repeat __10__ times per session. Do __2__ sessions per day.  Copyright  VHI. All rights reserved.   Feet Together, Head Motion - Eyes Open    With eyes open, feet together, move head slowly: up and down. Repeat __10__ times per session. Do __2__ sessions per day.  Copyright  VHI. All rights reserved.   SINGLE LIMB STANCE    Stance: single leg on floor. Raise leg. Hold _10-15__ seconds. Repeat with other leg. _3__ reps per set, 2 times/day. STAND NEAR A COUNTERTOP FOR SUPPORT.  Copyright  VHI. All rights reserved.

## 2017-03-07 NOTE — Therapy (Signed)
[facility] [address] [place], [place], [address] Phone: [phone]   Fax:  [phone]  Physical Therapy Evaluation  Patient Details  Name: Nathan Santiago MRN: [id number] Date of Birth: April 06, 1931 Referring Provider: Margette Fast, MD  Encounter Date: 03/07/2017      PT End of Session - 03/07/17 0902    Visit Number 1   Number of Visits 7   Date for PT Re-Evaluation 04/21/17  length of stay 4 weeks, however no appt available next week   Authorization Type G code every 10th visit.   PT Start Time 0802   PT Stop Time 0848   PT Time Calculation (min) 46 min   Activity Tolerance Patient tolerated treatment well   Behavior During Therapy WFL for tasks assessed/performed      Past Medical History:  Diagnosis Date  . AAA (abdominal aortic aneurysm) (HCC)    asymptomatic  . AAA (abdominal aortic aneurysm) ([place]) 2010   peripheral  angiogram-- bilateral SFA DISEASE  and 60 to 70% infrarenaal abd. aortic stenosis with 15 -mm gradient  . Adenomatous colon polyp 11/2003  . CAD (coronary artery disease)   . Gait abnormality 02/13/2017  . Gastroparesis   . GERD (gastroesophageal reflux disease)    w/ LPR  . Hyperlipidemia   . Hypertension   . Peripheral arterial disease ([place])    RCEA  by Dr Lemar Livings  . PUD (peptic ulcer disease)   . Vertigo     Past Surgical History:  Procedure Laterality Date  . AORTOGRAM  04/24/2016    Abdominal aortogram, bilateral iliac angiogram, bifemoral runoff  . CARDIAC CATHETERIZATION  05/12/2005   RCA  . carotid doppler  01/24/2013   RICA endarterectomy,left CCA 0-49%; left bulb and prox ICA 50-69%; bilaateral subclavian < 50%  . CAROTID ENDARTERECTOMY  11/01/2010  . CATARACT EXTRACTION    . CORONARY ANGIOPLASTY  05/18/2005   2 STENTS distal RCA AND PROXIMAL-MID RCA  . DOPPLER ECHOCARDIOGRAPHY  02/07/2012   EF 55%,SHOWED NO ISCHEMIA   . lower arterial  doppler  02/04/2013    aotra 1.5 x 1.5 cm; distal abd aorta 70-99%,proximal common iliac arteries -very stenotic with increased velocities>50%,may be falsely elevated as a result of residual plaque from the distal aorta stenosis  . lower extremity doppler  June 18 ,2013   ABI'S ABNORMAL, RABI was 0.88 and LABI 0.75 ,with 3-vessel  run off  . NM MYOVIEW LTD  MAY [id number]   showed no significant ischemia;  . NM MYOVIEW LTD  04/22/2008   ef 77%,exercise capcity 6 METS ,exaggerated blood pressure response to exercise  . PERIPHERAL VASCULAR CATHETERIZATION N/A 04/24/2016   Procedure: Lower Extremity Angiography;  Surgeon: Lorretta Harp, MD;  Location: [facility];  Service: Cardiovascular;  Laterality: N/A;  . PERIPHERAL VASCULAR CATHETERIZATION  04/24/2016   Procedure: Peripheral Vascular Intervention;  Surgeon: Lorretta Harp, MD;  Location: [facility];  Service: Cardiovascular;;  Aorta  . retrograde central aortic catheterization  05/19/2005   cutting balloon atherectomy, c-circ stenosis with DES STENTING CYPHER  . TONSILLECTOMY    . TRANSURETHRAL RESECTION OF PROSTATE N/A 09/08/2016   Procedure: TRANSURETHRAL RESECTION OF THE PROSTATE (TURP);  Surgeon: Ardis Hughs, MD;  Location: [facility];  Service: Urology;  Laterality: N/A;    There were no vitals filed for this visit.       Subjective Assessment - 03/07/17 0803    Subjective The patient reports h/o episodic  vertigo since 1966 that he uses low dose valium to treat keeping most episodes to < 2-3 days in duration.  He reports a recent episode in which he noted dizziness with moving in bed that lasted seconds.  At today's evaluation, he feels this has improved significantly, however he continues with imbalance.   Pertinent History Reports having PT in past for hypofunction and it was effective in improving balance.   Patient Stated Goals "Be able to walk a straight line" (feels like he pulls to the left).     Currently in Pain? No/denies             [facility] PT Assessment - 03/07/17 0810      Assessment   Medical Diagnosis vertigo   Referring Provider Margette Fast, MD   Onset Date/Surgical Date --  10/2016   Prior Therapy has had OP PT before for vestibular rehab     Precautions   Precautions Fall     Restrictions   Weight Bearing Restrictions No     Balance Screen   Has the patient fallen in the past 6 months No   Has the patient had a decrease in activity level because of a fear of falling?  No   Is the patient reluctant to leave their home because of a fear of falling?  No     Home Environment   Living Environment Private residence   Living Arrangements Spouse/significant other   Type of Home --  retirement community   Home Access Level entry   [place] One level     Prior Function   Level of Independence Independent   Leisure enjoys golf, noting worsening imbalance     ROM / Strength   AROM / PROM / Strength AROM;Strength     AROM   Overall AROM  Within functional limits for tasks performed     Strength   Overall Strength Deficits   Strength Assessment Site Hip;Knee;Ankle   Right/Left Hip Right;Left   Right Hip Flexion 5/5   Left Hip Flexion 5/5   Right/Left Knee Right;Left   Right Knee Flexion 5/5   Right Knee Extension 5/5   Left Knee Flexion 5/5   Left Knee Extension 5/5   Right/Left Ankle Right;Left   Right Ankle Dorsiflexion 4/5   Left Ankle Dorsiflexion 4/5   Left Ankle Plantar Flexion 4/5     Ambulation/Gait   Ambulation/Gait Yes   Ambulation/Gait Assistance 6: Modified independent (Device/Increase time)   Ambulation Distance (Feet) 150 Feet   Assistive device None   Gait Pattern Decreased stride length;Decreased trunk rotation   Stairs Yes   Stairs Assistance 6: Modified independent (Device/Increase time)   Stair Management Technique Two rails;Alternating pattern   Number of Stairs 4     Standardized Balance Assessment   Standardized Balance Assessment Berg Balance  Test     Berg Balance Test   Sit to Stand Able to stand without using hands and stabilize independently   Standing Unsupported Able to stand safely 2 minutes   Sitting with Back Unsupported but Feet Supported on Floor or Stool Able to sit safely and securely 2 minutes   Stand to Sit Sits safely with minimal use of hands   Transfers Able to transfer safely, minor use of hands   Standing Unsupported with Eyes Closed Able to stand 10 seconds safely   Standing Ubsupported with Feet Together Able to place feet together independently and stand 1 minute safely   From Standing, Reach Forward with  Outstretched Arm Can reach forward >12 cm safely (5")   From Standing Position, Pick up Object from [place] to pick up shoe safely and easily  got a moment of imbalance when returning to stand   From Standing Position, Turn to Look Behind Over each Shoulder Looks behind from both sides and weight shifts well   Turn 360 Degrees Able to turn 360 degrees safely in 4 seconds or less  had to take a step to catch balance, but did this indep   Standing Unsupported, Alternately Place Feet on Step/Stool Able to stand independently and safely and complete 8 steps in 20 seconds   Standing Unsupported, One Foot in Front Able to take small step independently and hold 30 seconds   Standing on One Leg Tries to lift leg/unable to hold 3 seconds but remains standing independently   Total Score 50   Berg comment: 50/56      Functional Gait  Assessment   Gait assessed  Yes   Gait Level Surface Walks 20 ft in less than 7 sec but greater than 5.5 sec, uses assistive device, slower speed, mild gait deviations, or deviates 6-10 in outside of the 12 in walkway width.   Change in Gait Speed Able to change speed, demonstrates mild gait deviations, deviates 6-10 in outside of the 12 in walkway width, or no gait deviations, unable to achieve a major change in velocity, or uses a change in velocity, or uses an assistive device.    Gait with Horizontal Head Turns Performs head turns with moderate changes in gait velocity, slows down, deviates 10-15 in outside 12 in walkway width but recovers, can continue to walk.   Gait with Vertical Head Turns Performs task with slight change in gait velocity (eg, minor disruption to smooth gait path), deviates 6 - 10 in outside 12 in walkway width or uses assistive device   Gait and Pivot Turn Pivot turns safely in greater than 3 sec and stops with no loss of balance, or pivot turns safely within 3 sec and stops with mild imbalance, requires small steps to catch balance.   Step Over Obstacle Is able to step over one shoe box (4.5 in total height) without changing gait speed. No evidence of imbalance.   Gait with Narrow Base of Support Ambulates 4-7 steps.   Gait with Eyes Closed Walks 20 ft, slow speed, abnormal gait pattern, evidence for imbalance, deviates 10-15 in outside 12 in walkway width. Requires more than 9 sec to ambulate 20 ft.   Ambulating Backwards Walks 20 ft, uses assistive device, slower speed, mild gait deviations, deviates 6-10 in outside 12 in walkway width.   Steps Alternating feet, must use rail.   Total Score 17   FGA comment: 17/30            Vestibular Assessment - 03/07/17 0812      Vestibular Assessment   General Observation Ambulates into clinic without a device at slowed pace     Symptom Behavior   Type of Dizziness Imbalance   Frequency of Dizziness daily   Duration of Dizziness seconds and when on feet is imbalance   Aggravating Factors Activity in general  when on his feet; vertigo is currently resolved   Relieving Factors No known relieving factors     Occulomotor Exam   Occulomotor Alignment Normal   Spontaneous Absent   Gaze-induced Absent   Smooth Pursuits Intact   Saccades Intact     Vestibulo-Occular Reflex   VOR  1 Head Only (x 1 viewing) Slow gaze x 1 viewing does not provoke symptoms   Comment Head impulse test positive  bilaterally for refixation saccade.     Positional Testing   Dix-Hallpike --   Sidelying Test Sidelying Right;Sidelying Left   Horizontal Canal Testing Horizontal Canal Right;Horizontal Canal Left     Sidelying Right   Sidelying Right Duration none   Sidelying Right Symptoms No nystagmus     Sidelying Left   Sidelying Left Duration none   Sidelying Left Symptoms No nystagmus     Horizontal Canal Right   Horizontal Canal Right Duration none   Horizontal Canal Right Symptoms Normal     Horizontal Canal Left   Horizontal Canal Left Duration none   Horizontal Canal Left Symptoms Normal        Objective measurements completed on examination: See above findings.                  PT Education - 03/07/17 0901    Education provided Yes   Education Details HEP: gaze x 1 adaptation, partial heel/toe with head motion, feet together + head motion, and single leg stance   Person(s) Educated Patient   Methods Explanation;Demonstration;Handout   Comprehension Verbalized understanding;Returned demonstration             PT Long Term Goals - 03/07/17 0902      PT LONG TERM GOAL #1   Title The patient will be indep with HEP for gaze adaptation, high level balance and dynamic gait activities.   Time 4   Period Weeks   Target Date 04/21/17     PT LONG TERM GOAL #2   Title The patient will improve FGA from 17/30 up to 22/30 to demonstrate improving dynamic gait activities.   Time 4   Period Weeks   Target Date 04/21/17     PT LONG TERM GOAL #3   Title The patient will improve single leg stance to > or equal to 10 seconds bilaterally for improved balance control   Time 4   Period Weeks   Target Date 04/21/17     PT LONG TERM GOAL #4   Title The patient will perform tandem stance x 30 seconds to demo improved narrow base of support control.   Time 4   Period Weeks   Target Date 04/21/17     PT LONG TERM GOAL #5   Title The patient will have SOT performed and  will improve by 8% from baseline score.   Time 4   Period Weeks   Target Date 04/21/17                Plan - 03/07/17 2376    Clinical Impression Statement The patient is an 83 year old male presenting to PT with h/o episodic vertigo, worsening balance, and unsteadiness with dynamic gait activities.  He feels vertigo recently improved, however he is noting continued unsteadiness.  PT to address deficits to promote improved mobility to maintain current activity and lifestyle (golf, exercise).    Clinical Presentation Stable   Clinical Decision Making Low   Rehab Potential Good   PT Frequency 2x / week   PT Duration 4 weeks  plan to do 2x/week x 2 weeks + 1x/week x 2 weeks   PT Treatment/Interventions ADLs/Self Care Home Management;Therapeutic activities;Therapeutic exercise;Balance training;Neuromuscular re-education;Patient/family education;Gait training;Vestibular;Canalith Repostioning   PT Next Visit Plan Check HEP, assess SOT, multi-sensory balance training with head motion (foam, rocker board, eyes closed/open).  Ankle strengthening exercises.   Consulted and Agree with Plan of Care Patient      Patient will benefit from skilled therapeutic intervention in order to improve the following deficits and impairments:  Abnormal gait, Decreased balance, Decreased strength, Dizziness, Difficulty walking  Visit Diagnosis: Other abnormalities of gait and mobility  Unsteadiness on feet  Dizziness and giddiness      G-Codes - 03/10/17 0908    Functional Assessment Tool Used (Outpatient Only) FGA=17/30.  Berg=50/56   Functional Limitation Mobility: Walking and moving around   Mobility: Walking and Moving Around Current Status [phone]) At least 40 percent but less than 60 percent impaired, limited or restricted   Mobility: Walking and Moving Around Goal Status [phone]) At least 20 percent but less than 40 percent impaired, limited or restricted       Problem List Patient Active  Problem List   Diagnosis Date Noted  . Gait abnormality 02/13/2017  . UTI (urinary tract infection) 11/22/2016  . Vertigo 11/22/2016  . Hypokalemia 11/22/2016  . Urinary frequency 09/08/2016  . Claudication ([place]) 04/24/2016  . Bilateral carotid artery disease ([facility] [facility]) 08/07/2014  . Peripheral arterial disease ([place]) 08/06/2013  . GASTROPARESIS 02/25/2009  . COLONIC POLYPS, ADENOMATOUS, HX OF 02/22/2009  . HYPERCHOLESTEROLEMIA 09/26/2007  . Essential hypertension 09/26/2007  . Coronary atherosclerosis 09/26/2007  . ALLERGIC RHINITIS 09/26/2007  . GERD 09/26/2007  . Peptic ulcer 09/26/2007  . UMBILICAL HERNIA 81/82/9937  . NEPHROLITHIASIS 09/26/2007  . BLADDER STONE 09/26/2007    Dillon Livermore, PT 10-Mar-2017, 9:09 AM  [facility] [address] [place], [place], [address] Phone: [phone]   Fax:  [phone]  Name: Nathan Santiago MRN: [id number] Date of Birth: [birth year]

## 2017-03-09 ENCOUNTER — Ambulatory Visit: Payer: Medicare Other | Admitting: Rehabilitative and Restorative Service Providers"

## 2017-03-09 DIAGNOSIS — R2689 Other abnormalities of gait and mobility: Secondary | ICD-10-CM | POA: Diagnosis not present

## 2017-03-09 DIAGNOSIS — R2681 Unsteadiness on feet: Secondary | ICD-10-CM

## 2017-03-09 DIAGNOSIS — R42 Dizziness and giddiness: Secondary | ICD-10-CM | POA: Diagnosis not present

## 2017-03-09 NOTE — Therapy (Signed)
[facility] [address] [place], [place], [address] Phone: [phone]   Fax:  [phone]  Physical Therapy Treatment  Patient Details  Name: Nathan Santiago MRN: [id number] Date of Birth: [birth year] Referring Provider: Margette Fast, MD  Encounter Date: 03/09/2017      PT End of Session - 03/09/17 1615    Visit Number 2   Number of Visits 7   Date for PT Re-Evaluation 04/21/17  length of stay 4 weeks, however no appt available next week   Authorization Type G code every 10th visit.   PT Start Time 1457   PT Stop Time 1540   PT Time Calculation (min) 43 min   Activity Tolerance Patient tolerated treatment well   Behavior During Therapy WFL for tasks assessed/performed      Past Medical History:  Diagnosis Date  . AAA (abdominal aortic aneurysm) (HCC)    asymptomatic  . AAA (abdominal aortic aneurysm) ([facility] [place]) 2010   peripheral  angiogram-- bilateral SFA DISEASE  and 60 to 70% infrarenaal abd. aortic stenosis with 15 -mm gradient  . Adenomatous colon polyp 11/2003  . CAD (coronary artery disease)   . Gait abnormality 02/13/2017  . Gastroparesis   . GERD (gastroesophageal reflux disease)    w/ LPR  . Hyperlipidemia   . Hypertension   . Peripheral arterial disease ([place])    RCEA  by Dr Lemar Livings  . PUD (peptic ulcer disease)   . Vertigo     Past Surgical History:  Procedure Laterality Date  . AORTOGRAM  04/24/2016    Abdominal aortogram, bilateral iliac angiogram, bifemoral runoff  . CARDIAC CATHETERIZATION  05/12/2005   RCA  . carotid doppler  01/24/2013   RICA endarterectomy,left CCA 0-49%; left bulb and prox ICA 50-69%; bilaateral subclavian < 50%  . CAROTID ENDARTERECTOMY  11/01/2010  . CATARACT EXTRACTION    . CORONARY ANGIOPLASTY  05/18/2005   2 STENTS distal RCA AND PROXIMAL-MID RCA  . DOPPLER ECHOCARDIOGRAPHY  02/07/2012   EF 55%,SHOWED NO ISCHEMIA   . lower arterial  doppler  02/04/2013    aotra 1.5 x 1.5 cm; distal abd aorta 70-99%,proximal common iliac arteries -very stenotic with increased velocities>50%,may be falsely elevated as a result of residual plaque from the distal aorta stenosis  . lower extremity doppler  June 18 ,2013   ABI'S ABNORMAL, RABI was 0.88 and LABI 0.75 ,with 3-vessel  run off  . NM MYOVIEW LTD  MAY [id number]   showed no significant ischemia;  . NM MYOVIEW LTD  04/22/2008   ef 77%,exercise capcity 6 METS ,exaggerated blood pressure response to exercise  . PERIPHERAL VASCULAR CATHETERIZATION N/A 04/24/2016   Procedure: Lower Extremity Angiography;  Surgeon: Lorretta Harp, MD;  Location: [facility];  Service: Cardiovascular;  Laterality: N/A;  . PERIPHERAL VASCULAR CATHETERIZATION  04/24/2016   Procedure: Peripheral Vascular Intervention;  Surgeon: Lorretta Harp, MD;  Location: [facility];  Service: Cardiovascular;;  Aorta  . retrograde central aortic catheterization  05/19/2005   cutting balloon atherectomy, c-circ stenosis with DES STENTING CYPHER  . TONSILLECTOMY    . TRANSURETHRAL RESECTION OF PROSTATE N/A 09/08/2016   Procedure: TRANSURETHRAL RESECTION OF THE PROSTATE (TURP);  Surgeon: Ardis Hughs, MD;  Location: [facility];  Service: Urology;  Laterality: N/A;    There were no vitals filed for this visit.      Subjective Assessment - 03/09/17 1535    Subjective The patient notes HEP is going  well--reports it is easy.  He woke with lightheaded feeling in back of his head today.     Pertinent History Reports having PT in past for hypofunction and it was effective in improving balance.   Patient Stated Goals "Be able to walk a straight line" (feels like he pulls to the left).              [facility] PT Assessment - 03/09/17 1505      High Level Balance   High Level Balance Comments Sensory organization testing = 78% scoring above normal limits comp to age/height norm of 68%.  He has WNLs use of somatosensory, visual and  vestibular inputs for balance.                     [facility] PT Treatment/Exercise - 03/09/17 1505      Standardized Balance Assessment   Standardized Balance Assessment Balance Master Testing     Neuro Re-ed    Neuro Re-ed Details  Partial heel/toe standing adding head motion and moving feet to narrowing base of support.  Performed single leg stance and reviewed gaze adaptation exercises from home.     Exercises   Exercises Other Exercises   Other Exercises  STANDING:  heel raises x 20 reps, toe raises x 20 reps with intermittent UE support.       Manual Therapy   Manual Therapy Soft tissue mobilization;Manual Traction   Manual therapy comments Patient c/o tightness in neck today.  He had rotation limited to R compared to L sides (estimate 50 deg R rotation and 65 deg L rotation).   Soft tissue mobilization suboccipital release and soft tissue mobilization of cervical spine with passive overpressure/stretching for levator and upper trapezius   Manual Traction supine manual traction x 30 second holds x 5 reps                PT Education - 03/09/17 1541    Education provided Yes   Education Details HEP: heel raises, toe raises   Person(s) Educated Patient   Methods Explanation;Demonstration;Handout   Comprehension Verbalized understanding;Returned demonstration             PT Long Term Goals - 03/09/17 1616      PT LONG TERM GOAL #1   Title The patient will be indep with HEP for gaze adaptation, high level balance and dynamic gait activities.   Time 4   Period Weeks     PT LONG TERM GOAL #2   Title The patient will improve FGA from 17/30 up to 22/30 to demonstrate improving dynamic gait activities.   Time 4   Period Weeks     PT LONG TERM GOAL #3   Title The patient will improve single leg stance to > or equal to 10 seconds bilaterally for improved balance control   Time 4   Period Weeks     PT LONG TERM GOAL #4   Title The patient will  perform tandem stance x 30 seconds to demo improved narrow base of support control.   Time 4   Period Weeks     PT LONG TERM GOAL #5   Title The patient will have SOT performed and will improve by 8% from baseline score.   Baseline d/c goal as patient scores WNLs on baseline testing.   Time 4   Period Weeks   Status Achieved               Plan -  03/09/17 1620    Clinical Impression Statement The patient scores WNLs on sensory organization testing indicating that motor issues may be contributing to imbalance.  PT added ankle strengthening activities and neck stretching within session today.  Will continue to develop home program.    PT Treatment/Interventions ADLs/Self Care Home Management;Therapeutic activities;Therapeutic exercise;Balance training;Neuromuscular re-education;Patient/family education;Gait training;Vestibular;Canalith Repostioning   PT Next Visit Plan Dynamic gait, neck stretching + manual as needed, descending stairs working on knee control in single leg stance.   Consulted and Agree with Plan of Care Patient      Patient will benefit from skilled therapeutic intervention in order to improve the following deficits and impairments:  Abnormal gait, Decreased balance, Decreased strength, Dizziness, Difficulty walking  Visit Diagnosis: Other abnormalities of gait and mobility  Unsteadiness on feet  Dizziness and giddiness     Problem List Patient Active Problem List   Diagnosis Date Noted  . Gait abnormality 02/13/2017  . UTI (urinary tract infection) 11/22/2016  . Vertigo 11/22/2016  . Hypokalemia 11/22/2016  . Urinary frequency 09/08/2016  . Claudication ([place]) 04/24/2016  . Bilateral carotid artery disease ([place]) 08/07/2014  . Peripheral arterial disease ([place]) 08/06/2013  . GASTROPARESIS 02/25/2009  . COLONIC POLYPS, ADENOMATOUS, HX OF 02/22/2009  . HYPERCHOLESTEROLEMIA 09/26/2007  . Essential hypertension 09/26/2007  . Coronary atherosclerosis  09/26/2007  . ALLERGIC RHINITIS 09/26/2007  . GERD 09/26/2007  . Peptic ulcer 09/26/2007  . UMBILICAL HERNIA 21/19/4174  . NEPHROLITHIASIS 09/26/2007  . BLADDER STONE 09/26/2007    Kilan Banfill, PT 03/09/2017, 4:21 PM  [facility] [address] [place], [place], [address] Phone: [phone]   Fax:  [phone]  Name: Nathan Santiago MRN: [id number] Date of Birth: 1931/03/22

## 2017-03-09 NOTE — Patient Instructions (Signed)
Gaze Stabilization - Tip Card  1.Target must remain in focus, not blurry, and appear stationary while head is in motion. 2.Perform exercises with small head movements (45 to either side of midline). 3.Increase speed of head motion so long as target is in focus. 4.If you wear eyeglasses, be sure you can see target through lens (therapist will give specific instructions for bifocal / progressive lenses). 5.These exercises may provoke dizziness or nausea. Work through these symptoms. If too dizzy, slow head movement slightly. Rest between each exercise. 6.Exercises demand concentration; avoid distractions. 7.For safety, perform standing exercises close to a counter, wall, corner, or next to someone.  Copyright  VHI. All rights reserved.   Gaze Stabilization - Standing Feet Apart   Feet shoulder width apart, keeping eyes on target on wall 3 feet away, tilt head down slightly and move head side to side for 30 seconds. Do 2-3 sessions per day.   Copyright  VHI. All rights reserved.   Feet Partial Heel-Toe, Head Motion - Eyes Open    With eyes open, right foot partially in front of the other, move head slowly: side to side X 10 times.  Then switch feet and repeat with head motion up and down x 10 times. CAN MOVE YOUR FEET TO BE MORE NARROW AS THIS GETS EASIER.     Repeat __10__ times per session. Do __2__ sessions per day.  Copyright  VHI. All rights reserved.   Feet Together, Head Motion - Eyes Open    SINGLE LIMB STANCE    Stance: single leg on floor. Raise leg. Hold _10-15__ seconds. Repeat with other leg. _3__ reps per set, 2 times/day. STAND NEAR A COUNTERTOP FOR SUPPORT.  Copyright  VHI. All rights reserved.   Heel Raise: Bilateral (Standing)    Rise on balls of feet.  Hold 3 seconds. Repeat __20__ times per set. Do _1___ sets per session. Do _2___ sessions per day.  http://orth.exer.us/38   Copyright  VHI. All rights reserved.   ANKLE:  Dorsiflexion - Standing    Stand with upright posture. Raise toes of both feet up at same time. _20__ reps per set, _2__ sets per day. Copyright  VHI. All rights reserved.

## 2017-03-13 DIAGNOSIS — I7389 Other specified peripheral vascular diseases: Secondary | ICD-10-CM | POA: Diagnosis not present

## 2017-03-13 DIAGNOSIS — E784 Other hyperlipidemia: Secondary | ICD-10-CM | POA: Diagnosis not present

## 2017-03-13 DIAGNOSIS — K635 Polyp of colon: Secondary | ICD-10-CM | POA: Diagnosis not present

## 2017-03-13 DIAGNOSIS — R7309 Other abnormal glucose: Secondary | ICD-10-CM | POA: Diagnosis not present

## 2017-03-13 DIAGNOSIS — I10 Essential (primary) hypertension: Secondary | ICD-10-CM | POA: Diagnosis not present

## 2017-03-13 DIAGNOSIS — I701 Atherosclerosis of renal artery: Secondary | ICD-10-CM | POA: Diagnosis not present

## 2017-03-13 DIAGNOSIS — I6529 Occlusion and stenosis of unspecified carotid artery: Secondary | ICD-10-CM | POA: Diagnosis not present

## 2017-03-13 DIAGNOSIS — C61 Malignant neoplasm of prostate: Secondary | ICD-10-CM | POA: Diagnosis not present

## 2017-03-13 DIAGNOSIS — Z6829 Body mass index (BMI) 29.0-29.9, adult: Secondary | ICD-10-CM | POA: Diagnosis not present

## 2017-03-13 DIAGNOSIS — R42 Dizziness and giddiness: Secondary | ICD-10-CM | POA: Diagnosis not present

## 2017-03-14 ENCOUNTER — Ambulatory Visit: Payer: Medicare Other | Admitting: Rehabilitative and Restorative Service Providers"

## 2017-03-14 DIAGNOSIS — R2689 Other abnormalities of gait and mobility: Secondary | ICD-10-CM

## 2017-03-14 DIAGNOSIS — R2681 Unsteadiness on feet: Secondary | ICD-10-CM | POA: Diagnosis not present

## 2017-03-14 DIAGNOSIS — R42 Dizziness and giddiness: Secondary | ICD-10-CM | POA: Diagnosis not present

## 2017-03-14 NOTE — Therapy (Signed)
[facility] [address] [place], [place], [address] Phone: [phone]   Fax:  [phone]  Physical Therapy Treatment  Patient Details  Name: Nathan Santiago MRN: [id number] Date of Birth: [birth year] Referring Provider: Margette Fast, MD  Encounter Date: 03/14/2017      PT End of Session - 03/14/17 1006    Visit Number 3   Number of Visits 7   Date for PT Re-Evaluation 04/21/17  length of stay 4 weeks, however no appt available next week   Authorization Type G code every 10th visit.   PT Start Time [phone]   PT Stop Time 1016   PT Time Calculation (min) 40 min   Activity Tolerance Patient tolerated treatment well   Behavior During Therapy WFL for tasks assessed/performed      Past Medical History:  Diagnosis Date  . AAA (abdominal aortic aneurysm) (HCC)    asymptomatic  . AAA (abdominal aortic aneurysm) ([facility]) 2010   peripheral  angiogram-- bilateral SFA DISEASE  and 60 to 70% infrarenaal abd. aortic stenosis with 15 -mm gradient  . Adenomatous colon polyp 11/2003  . CAD (coronary artery disease)   . Gait abnormality 02/13/2017  . Gastroparesis   . GERD (gastroesophageal reflux disease)    w/ LPR  . Hyperlipidemia   . Hypertension   . Peripheral arterial disease ([facility])    RCEA  by Dr Lemar Livings  . PUD (peptic ulcer disease)   . Vertigo     Past Surgical History:  Procedure Laterality Date  . AORTOGRAM  04/24/2016    Abdominal aortogram, bilateral iliac angiogram, bifemoral runoff  . CARDIAC CATHETERIZATION  05/12/2005   RCA  . carotid doppler  01/24/2013   RICA endarterectomy,left CCA 0-49%; left bulb and prox ICA 50-69%; bilaateral subclavian < 50%  . CAROTID ENDARTERECTOMY  11/01/2010  . CATARACT EXTRACTION    . CORONARY ANGIOPLASTY  05/18/2005   2 STENTS distal RCA AND PROXIMAL-MID RCA  . DOPPLER ECHOCARDIOGRAPHY  02/07/2012   EF 55%,SHOWED NO ISCHEMIA   . lower arterial  doppler  02/04/2013    aotra 1.5 x 1.5 cm; distal abd aorta 70-99%,proximal common iliac arteries -very stenotic with increased velocities>50%,may be falsely elevated as a result of residual plaque from the distal aorta stenosis  . lower extremity doppler  June 18 ,2013   ABI'S ABNORMAL, RABI was 0.88 and LABI 0.75 ,with 3-vessel  run off  . NM MYOVIEW LTD  MAY [id number]   showed no significant ischemia;  . NM MYOVIEW LTD  04/22/2008   ef 77%,exercise capcity 6 METS ,exaggerated blood pressure response to exercise  . PERIPHERAL VASCULAR CATHETERIZATION N/A 04/24/2016   Procedure: Lower Extremity Angiography;  Surgeon: Lorretta Harp, MD;  Location: [facility];  Service: Cardiovascular;  Laterality: N/A;  . PERIPHERAL VASCULAR CATHETERIZATION  04/24/2016   Procedure: Peripheral Vascular Intervention;  Surgeon: Lorretta Harp, MD;  Location: [facility];  Service: Cardiovascular;;  Aorta  . retrograde central aortic catheterization  05/19/2005   cutting balloon atherectomy, c-circ stenosis with DES STENTING CYPHER  . TONSILLECTOMY    . TRANSURETHRAL RESECTION OF PROSTATE N/A 09/08/2016   Procedure: TRANSURETHRAL RESECTION OF THE PROSTATE (TURP);  Surgeon: Ardis Hughs, MD;  Location: [facility];  Service: Urology;  Laterality: N/A;    There were no vitals filed for this visit.      Subjective Assessment - 03/14/17 0940    Subjective The patient notes achiness in knees  within a couple of minutes of walking.  "Walking is so boring", when asked about walking for exercise.  No dizziness and lightheadedness noted today.   Standing on one leg is hard for HEP.   Pertinent History Reports having PT in past for hypofunction and it was effective in improving balance.   Patient Stated Goals "Be able to walk a straight line" (feels like he pulls to the left).     Currently in Pain? No/denies                         [facility] Adult PT Treatment/Exercise - 03/14/17 1128      Ambulation/Gait    Ambulation/Gait Yes   Ambulation/Gait Assistance 6: Modified independent (Device/Increase time)   Ambulation Distance (Feet) 300 Feet   Assistive device None   Ambulation Surface Level;Indoor   Stairs Yes   Stairs Assistance 6: Modified independent (Device/Increase time)   Stair Management Technique Two rails;Alternating pattern   Number of Stairs 4  x 3 reps   Pre-Gait Activities On stairs, the patient uses hip ER with feet abducted from midline to compensate for tight heel cords.     Gait Comments Gait activities emphasizing longer stride length and reduced hip ER (as patient appears to be doing this to compensate for tight heel cords).     Neuro Re-ed    Neuro Re-ed Details  Standing balance exercises emphasizing limits of stabiltity using ramp to increase heel cord length and work on balance at various ankle positions.     Exercises   Exercises Other Exercises   Other Exercises  STANDING:  Heel cord stretch with tactile and verbal/demo cues for correct technique; standing modified down/dog for posterior leg stretch, toe raises x 10 reps, sidestepping without UE support x 20 feet bilaterally, standing hip abduction without UE support x 5 reps each side.  SEATED:  hamstring stretch with two chairs for posterior leg elongation.                  PT Education - 03/14/17 1127    Education provided Yes   Education Details Updated HEP:  added heel cord and hamstring stretch (removed 2 prior activities)   Person(s) Educated Patient   Methods Explanation;Demonstration;Handout   Comprehension Verbalized understanding;Returned demonstration             PT Long Term Goals - 03/09/17 1616      PT LONG TERM GOAL #1   Title The patient will be indep with HEP for gaze adaptation, high level balance and dynamic gait activities.   Time 4   Period Weeks     PT LONG TERM GOAL #2   Title The patient will improve FGA from 17/30 up to 22/30 to demonstrate improving dynamic gait  activities.   Time 4   Period Weeks     PT LONG TERM GOAL #3   Title The patient will improve single leg stance to > or equal to 10 seconds bilaterally for improved balance control   Time 4   Period Weeks     PT LONG TERM GOAL #4   Title The patient will perform tandem stance x 30 seconds to demo improved narrow base of support control.   Time 4   Period Weeks     PT LONG TERM GOAL #5   Title The patient will have SOT performed and will improve by 8% from baseline score.   Baseline d/c goal as patient scores  WNLs on baseline testing.   Time 4   Period Weeks   Status Achieved               Plan - 03/14/17 1017    Clinical Impression Statement The patient favors knees when ambulating, however with further assessment this appears to be due to shortening through bilateral heel cords.  He reports difficulty walking up inclines and descending steps.  PT to emphasize to work on improving overall mobility.   PT Treatment/Interventions ADLs/Self Care Home Management;Therapeutic activities;Therapeutic exercise;Balance training;Neuromuscular re-education;Patient/family education;Gait training;Vestibular;Canalith Repostioning   PT Next Visit Plan hip stretching, check HEP, ankle lengthening, limits of stability training   Consulted and Agree with Plan of Care Patient      Patient will benefit from skilled therapeutic intervention in order to improve the following deficits and impairments:  Abnormal gait, Decreased balance, Decreased strength, Dizziness, Difficulty walking  Visit Diagnosis: Other abnormalities of gait and mobility  Unsteadiness on feet  Dizziness and giddiness     Problem List Patient Active Problem List   Diagnosis Date Noted  . Gait abnormality 02/13/2017  . UTI (urinary tract infection) 11/22/2016  . Vertigo 11/22/2016  . Hypokalemia 11/22/2016  . Urinary frequency 09/08/2016  . Claudication ([facility]) 04/24/2016  . Bilateral carotid artery disease ([facility])  08/07/2014  . Peripheral arterial disease ([facility]) 08/06/2013  . GASTROPARESIS 02/25/2009  . COLONIC POLYPS, ADENOMATOUS, HX OF 02/22/2009  . HYPERCHOLESTEROLEMIA 09/26/2007  . Essential hypertension 09/26/2007  . Coronary atherosclerosis 09/26/2007  . ALLERGIC RHINITIS 09/26/2007  . GERD 09/26/2007  . Peptic ulcer 09/26/2007  . UMBILICAL HERNIA 88/82/8003  . NEPHROLITHIASIS 09/26/2007  . BLADDER STONE 09/26/2007    Rosaleen Mazer, PT 03/14/2017, 11:33 AM  [facility] [address] [place], [place], [address] Phone: [phone]   Fax:  [phone]  Name: LARS JEZIORSKI MRN: [id number] Date of Birth: 1931-05-11

## 2017-03-14 NOTE — Patient Instructions (Signed)
Gaze Stabilization - Tip Card  1.Target must remain in focus, not blurry, and appear stationary while head is in motion. 2.Perform exercises with small head movements (45 to either side of midline). 3.Increase speed of head motion so long as target is in focus. 4.If you wear eyeglasses, be sure you can see target through lens (therapist will give specific instructions for bifocal / progressive lenses). 5.These exercises may provoke dizziness or nausea. Work through these symptoms. If too dizzy, slow head movement slightly. Rest between each exercise. 6.Exercises demand concentration; avoid distractions. 7.For safety, perform standing exercises close to a counter, wall, corner, or next to someone.  Copyright  VHI. All rights reserved.  Gaze Stabilization - Standing Feet Apart   Feet shoulder width apart, keeping eyes on target on wall 3 feet away, tilt head down slightly and move head side to side for 30 seconds. Do 2-3 sessions per day.   Copyright  VHI. All rights reserved.  SINGLE LIMB STANCE    Stance: single leg on floor. Raise leg. Hold _10-15__ seconds. Repeat with other leg. _3__ reps per set, 2 times/day. STAND NEAR A COUNTERTOP FOR SUPPORT.  Copyright  VHI. All rights reserved.    ANKLE: Dorsiflexion - Standing    Stand with upright posture. Raise toes of both feet up at same time. _20__ reps per set, _2__ sets per day. Copyright  VHI. All rights reserved.      Electronically signed by Mervyn Gay, PT at 03/09/2017 3:40 PM    Heel Cord Stretch    Place one leg forward, bent, other leg behind and straight. Lean forward keeping back heel flat. Hold __30__ seconds while counting out loud. Repeat with other leg. Repeat _3___ times. Do _2___ sessions per day.  http://gt2.exer.us/511   Copyright  VHI. All rights reserved.   Hamstring Stretch, Seated (Strap, Two Chairs)    Sit with one leg extended onto facing chair.  NO STRAP needed.   Sit up tall and lean forward to feel stretch along back of leg. Hold x 20-30 seconds and rest.  Repeat 2-3 times on each side.   2 times/day. Copyright  VHI. All rights reserved.

## 2017-03-16 ENCOUNTER — Ambulatory Visit: Payer: Medicare Other | Admitting: Rehabilitative and Restorative Service Providers"

## 2017-03-16 DIAGNOSIS — R2681 Unsteadiness on feet: Secondary | ICD-10-CM | POA: Diagnosis not present

## 2017-03-16 DIAGNOSIS — R2689 Other abnormalities of gait and mobility: Secondary | ICD-10-CM

## 2017-03-16 DIAGNOSIS — R42 Dizziness and giddiness: Secondary | ICD-10-CM

## 2017-03-16 NOTE — Therapy (Signed)
[facility] [address] [place], [place], [address] Phone: [phone]   Fax:  [phone]  Physical Therapy Treatment  Patient Details  Name: Nathan Santiago MRN: [id number] Date of Birth: Apr 04, 1931 Referring Provider: Margette Fast, MD  Encounter Date: 03/16/2017      PT End of Session - 03/16/17 1604    Visit Number 4   Number of Visits 7   Date for PT Re-Evaluation 04/21/17  length of stay 4 weeks, however no appt available next week   Authorization Type G code every 10th visit.   PT Start Time 1455   PT Stop Time 1535   PT Time Calculation (min) 40 min   Equipment Utilized During Treatment Gait belt   Activity Tolerance Patient tolerated treatment well   Behavior During Therapy WFL for tasks assessed/performed      Past Medical History:  Diagnosis Date  . AAA (abdominal aortic aneurysm) (HCC)    asymptomatic  . AAA (abdominal aortic aneurysm) ([facility]) 2010   peripheral  angiogram-- bilateral SFA DISEASE  and 60 to 70% infrarenaal abd. aortic stenosis with 15 -mm gradient  . Adenomatous colon polyp 11/2003  . CAD (coronary artery disease)   . Gait abnormality 02/13/2017  . Gastroparesis   . GERD (gastroesophageal reflux disease)    w/ LPR  . Hyperlipidemia   . Hypertension   . Peripheral arterial disease ([facility])    RCEA  by Dr Lemar Livings  . PUD (peptic ulcer disease)   . Vertigo     Past Surgical History:  Procedure Laterality Date  . AORTOGRAM  04/24/2016    Abdominal aortogram, bilateral iliac angiogram, bifemoral runoff  . CARDIAC CATHETERIZATION  05/12/2005   RCA  . carotid doppler  01/24/2013   RICA endarterectomy,left CCA 0-49%; left bulb and prox ICA 50-69%; bilaateral subclavian < 50%  . CAROTID ENDARTERECTOMY  11/01/2010  . CATARACT EXTRACTION    . CORONARY ANGIOPLASTY  05/18/2005   2 STENTS distal RCA AND PROXIMAL-MID RCA  . DOPPLER ECHOCARDIOGRAPHY  02/07/2012   EF 55%,SHOWED NO  ISCHEMIA   . lower arterial  doppler  02/04/2013   aotra 1.5 x 1.5 cm; distal abd aorta 70-99%,proximal common iliac arteries -very stenotic with increased velocities>50%,may be falsely elevated as a result of residual plaque from the distal aorta stenosis  . lower extremity doppler  June 18 ,2013   ABI'S ABNORMAL, RABI was 0.88 and LABI 0.75 ,with 3-vessel  run off  . NM MYOVIEW LTD  MAY [id number]   showed no significant ischemia;  . NM MYOVIEW LTD  04/22/2008   ef 77%,exercise capcity 6 METS ,exaggerated blood pressure response to exercise  . PERIPHERAL VASCULAR CATHETERIZATION N/A 04/24/2016   Procedure: Lower Extremity Angiography;  Surgeon: Lorretta Harp, MD;  Location: [facility];  Service: Cardiovascular;  Laterality: N/A;  . PERIPHERAL VASCULAR CATHETERIZATION  04/24/2016   Procedure: Peripheral Vascular Intervention;  Surgeon: Lorretta Harp, MD;  Location: [facility];  Service: Cardiovascular;;  Aorta  . retrograde central aortic catheterization  05/19/2005   cutting balloon atherectomy, c-circ stenosis with DES STENTING CYPHER  . TONSILLECTOMY    . TRANSURETHRAL RESECTION OF PROSTATE N/A 09/08/2016   Procedure: TRANSURETHRAL RESECTION OF THE PROSTATE (TURP);  Surgeon: Ardis Hughs, MD;  Location: [facility];  Service: Urology;  Laterality: N/A;    There were no vitals filed for this visit.      Subjective Assessment - 03/16/17 1604  Subjective The patient reports soreness from hips down after therapy on Wednesday.  He hit golf balls on Wednesday and fell afterwards when stepping down from a curb.    Pertinent History Reports having PT in past for hypofunction and it was effective in improving balance.   Patient Stated Goals "Be able to walk a straight line" (feels like he pulls to the left).     Currently in Pain? No/denies                         [facility] Adult PT Treatment/Exercise - 03/16/17 1612      Ambulation/Gait   Ambulation/Gait  Yes   Ambulation/Gait Assistance 6: Modified independent (Device/Increase time)   Ambulation Distance (Feet) 400 Feet   Assistive device None   Ambulation Surface Level;Indoor   Gait Comments Longer stride length and arm swing exaggerated to emphasize large amplitude movements.       Neuro Re-ed    Neuro Re-ed Details  Standing rocker board emphasizing hip and ankle strategies performing wall bumps and orienting to midline with CGA to min A.  Rocker board standing with alternating UE reaching.   Alternating LE foot taps to 12" surface without UE suppport.     Exercises   Other Exercises  PRONE:  quad stretch with contract/relax, hip extension x 10 reps right and left. Prone on elbows with neck extension. Left SIDELYING performing R IT band stretch with passive overpressure.  SUPINE:  piriformis stretch right and left x 2 reps each side.  SEATED: hamstring stretch x 2 reps each side, thomas test stretch right and left.    Stairs, "up/up, down/down" x 10 reps each leg.                       PT Long Term Goals - 03/09/17 1616      PT LONG TERM GOAL #1   Title The patient will be indep with HEP for gaze adaptation, high level balance and dynamic gait activities.   Time 4   Period Weeks     PT LONG TERM GOAL #2   Title The patient will improve FGA from 17/30 up to 22/30 to demonstrate improving dynamic gait activities.   Time 4   Period Weeks     PT LONG TERM GOAL #3   Title The patient will improve single leg stance to > or equal to 10 seconds bilaterally for improved balance control   Time 4   Period Weeks     PT LONG TERM GOAL #4   Title The patient will perform tandem stance x 30 seconds to demo improved narrow base of support control.   Time 4   Period Weeks     PT LONG TERM GOAL #5   Title The patient will have SOT performed and will improve by 8% from baseline score.   Baseline d/c goal as patient scores WNLs on baseline testing.   Time 4   Period Weeks    Status Achieved               Plan - 03/16/17 1616    Clinical Impression Statement The patient notes soreness from activities in therapy. PT encouraging continued work at home with current HEP.  Continue to LTGs.    PT Treatment/Interventions ADLs/Self Care Home Management;Therapeutic activities;Therapeutic exercise;Balance training;Neuromuscular re-education;Patient/family education;Gait training;Vestibular;Canalith Repostioning   PT Next Visit Plan hip stretching, check HEP, ankle lengthening, limits of stability training  Consulted and Agree with Plan of Care Patient      Patient will benefit from skilled therapeutic intervention in order to improve the following deficits and impairments:  Abnormal gait, Decreased balance, Decreased strength, Dizziness, Difficulty walking  Visit Diagnosis: Other abnormalities of gait and mobility  Unsteadiness on feet  Dizziness and giddiness     Problem List Patient Active Problem List   Diagnosis Date Noted  . Gait abnormality 02/13/2017  . UTI (urinary tract infection) 11/22/2016  . Vertigo 11/22/2016  . Hypokalemia 11/22/2016  . Urinary frequency 09/08/2016  . Claudication ([place]) 04/24/2016  . Bilateral carotid artery disease ([facility]) 08/07/2014  . Peripheral arterial disease ([facility]) 08/06/2013  . GASTROPARESIS 02/25/2009  . COLONIC POLYPS, ADENOMATOUS, HX OF 02/22/2009  . HYPERCHOLESTEROLEMIA 09/26/2007  . Essential hypertension 09/26/2007  . Coronary atherosclerosis 09/26/2007  . ALLERGIC RHINITIS 09/26/2007  . GERD 09/26/2007  . Peptic ulcer 09/26/2007  . UMBILICAL HERNIA 18/48/5927  . NEPHROLITHIASIS 09/26/2007  . BLADDER STONE 09/26/2007    Mayer Vondrak, PT 03/16/2017, 4:17 PM  [facility] [address] [place], [place], [address] Phone: [phone]   Fax:  [phone]  Name: Nathan Santiago MRN: [id number] Date of Birth: Dec 02, 1930

## 2017-03-20 ENCOUNTER — Ambulatory Visit: Payer: Medicare Other | Admitting: Rehabilitative and Restorative Service Providers"

## 2017-03-20 DIAGNOSIS — R2681 Unsteadiness on feet: Secondary | ICD-10-CM

## 2017-03-20 DIAGNOSIS — R2689 Other abnormalities of gait and mobility: Secondary | ICD-10-CM | POA: Diagnosis not present

## 2017-03-20 DIAGNOSIS — R42 Dizziness and giddiness: Secondary | ICD-10-CM | POA: Diagnosis not present

## 2017-03-20 NOTE — Therapy (Signed)
[facility] [address] [place], [place], [address] Phone: [phone]   Fax:  [phone]  Physical Therapy Treatment  Patient Details  Name: Nathan Santiago MRN: [id number] Date of Birth: 29-Mar-1931 Referring Provider: Margette Fast, MD  Encounter Date: 03/20/2017      PT End of Session - 03/20/17 1425    Visit Number 5   Number of Visits 7   Date for PT Re-Evaluation 04/21/17  length of stay 4 weeks, however no appt available next week   Authorization Type G code every 10th visit.   PT Start Time 1234   PT Stop Time 1320   PT Time Calculation (min) 46 min   Equipment Utilized During Treatment Gait belt   Activity Tolerance Patient tolerated treatment well   Behavior During Therapy WFL for tasks assessed/performed      Past Medical History:  Diagnosis Date  . AAA (abdominal aortic aneurysm) (HCC)    asymptomatic  . AAA (abdominal aortic aneurysm) ([place]) 2010   peripheral  angiogram-- bilateral SFA DISEASE  and 60 to 70% infrarenaal abd. aortic stenosis with 15 -mm gradient  . Adenomatous colon polyp 11/2003  . CAD (coronary artery disease)   . Gait abnormality 02/13/2017  . Gastroparesis   . GERD (gastroesophageal reflux disease)    w/ LPR  . Hyperlipidemia   . Hypertension   . Peripheral arterial disease ([place])    RCEA  by Dr Lemar Livings  . PUD (peptic ulcer disease)   . Vertigo     Past Surgical History:  Procedure Laterality Date  . AORTOGRAM  04/24/2016    Abdominal aortogram, bilateral iliac angiogram, bifemoral runoff  . CARDIAC CATHETERIZATION  05/12/2005   RCA  . carotid doppler  01/24/2013   RICA endarterectomy,left CCA 0-49%; left bulb and prox ICA 50-69%; bilaateral subclavian < 50%  . CAROTID ENDARTERECTOMY  11/01/2010  . CATARACT EXTRACTION    . CORONARY ANGIOPLASTY  05/18/2005   2 STENTS distal RCA AND PROXIMAL-MID RCA  . DOPPLER ECHOCARDIOGRAPHY  02/07/2012   EF 55%,SHOWED NO  ISCHEMIA   . lower arterial  doppler  02/04/2013   aotra 1.5 x 1.5 cm; distal abd aorta 70-99%,proximal common iliac arteries -very stenotic with increased velocities>50%,may be falsely elevated as a result of residual plaque from the distal aorta stenosis  . lower extremity doppler  June 18 ,2013   ABI'S ABNORMAL, RABI was 0.88 and LABI 0.75 ,with 3-vessel  run off  . NM MYOVIEW LTD  MAY [id number]   showed no significant ischemia;  . NM MYOVIEW LTD  04/22/2008   ef 77%,exercise capcity 6 METS ,exaggerated blood pressure response to exercise  . PERIPHERAL VASCULAR CATHETERIZATION N/A 04/24/2016   Procedure: Lower Extremity Angiography;  Surgeon: Lorretta Harp, MD;  Location: [facility];  Service: Cardiovascular;  Laterality: N/A;  . PERIPHERAL VASCULAR CATHETERIZATION  04/24/2016   Procedure: Peripheral Vascular Intervention;  Surgeon: Lorretta Harp, MD;  Location: [facility];  Service: Cardiovascular;;  Aorta  . retrograde central aortic catheterization  05/19/2005   cutting balloon atherectomy, c-circ stenosis with DES STENTING CYPHER  . TONSILLECTOMY    . TRANSURETHRAL RESECTION OF PROSTATE N/A 09/08/2016   Procedure: TRANSURETHRAL RESECTION OF THE PROSTATE (TURP);  Surgeon: Ardis Hughs, MD;  Location: [facility];  Service: Urology;  Laterality: N/A;    There were no vitals filed for this visit.      Subjective Assessment - 03/20/17 1238  Subjective The patient notes that standing on one leg is a little easier.  No falls since last visit.   Pertinent History Reports having PT in past for hypofunction and it was effective in improving balance.   Patient Stated Goals "Be able to walk a straight line" (feels like he pulls to the left).     Currently in Pain? No/denies            [facility] PT Assessment - 03/20/17 1305      Functional Gait  Assessment   Gait assessed  Yes   Gait Level Surface Walks 20 ft in less than 7 sec but greater than 5.5 sec, uses assistive  device, slower speed, mild gait deviations, or deviates 6-10 in outside of the 12 in walkway width.   Change in Gait Speed Able to smoothly change walking speed without loss of balance or gait deviation. Deviate no more than 6 in outside of the 12 in walkway width.   Gait with Horizontal Head Turns Performs head turns smoothly with slight change in gait velocity (eg, minor disruption to smooth gait path), deviates 6-10 in outside 12 in walkway width, or uses an assistive device.   Gait with Vertical Head Turns Performs task with slight change in gait velocity (eg, minor disruption to smooth gait path), deviates 6 - 10 in outside 12 in walkway width or uses assistive device   Gait and Pivot Turn Pivot turns safely within 3 sec and stops quickly with no loss of balance.   Step Over Obstacle Is able to step over one shoe box (4.5 in total height) without changing gait speed. No evidence of imbalance.   Gait with Narrow Base of Support Ambulates 7-9 steps.   Gait with Eyes Closed Cannot walk 20 ft without assistance, severe gait deviations or imbalance, deviates greater than 15 in outside 12 in walkway width or will not attempt task.   Ambulating Backwards Walks 20 ft, uses assistive device, slower speed, mild gait deviations, deviates 6-10 in outside 12 in walkway width.   Steps Alternating feet, must use rail.   Total Score 20   FGA comment: 20/30                     [facility] Adult PT Treatment/Exercise - 03/20/17 1257      Ambulation/Gait   Ambulation/Gait Yes   Ambulation/Gait Assistance 6: Modified independent (Device/Increase time)   Ambulation Distance (Feet) 500 Feet   Assistive device None   Ambulation Surface Level;Indoor   Stairs Yes   Stairs Assistance 6: Modified independent (Device/Increase time)   Stair Management Technique No rails;Alternating pattern  some unsteadiness noted   Gait Comments Longer stride length and arm swing exaggerated to emphasize large amplitude  movements.       Neuro Re-ed    Neuro Re-ed Details  Rocker board with alternating foot stepping anterioirly x 10 reps each leg,  Reciprocal step ups for sequencing and speed x 5 reps each leg, step downs from 8" step alternating with min A due to dec'd control/imbalance.  Standing on a ramp performing alternating foot taps to targets.  Standing on one leg dec'ing UE support.  Tandem gait activities, marching, heel/toe walking emphasizing dynamic balance.     Exercises   Exercises Other Exercises   Other Exercises  PRONE:  passive quad stretch with overpressure, STANDING heel raises with 5 second holds x 10 reps, toe raises x 3 second holds x 10 seconds.  PT Long Term Goals - 03/20/17 1314      PT LONG TERM GOAL #1   Title The patient will be indep with HEP for gaze adaptation, high level balance and dynamic gait activities.   Time 4   Period Weeks     PT LONG TERM GOAL #2   Title The patient will improve FGA from 17/30 up to 22/30 to demonstrate improving dynamic gait activities.   Baseline 20/30 (improved from 17/30)   Time 4   Period Weeks     PT LONG TERM GOAL #3   Title The patient will improve single leg stance to > or equal to 10 seconds bilaterally for improved balance control   Time 4   Period Weeks     PT LONG TERM GOAL #4   Title The patient will perform tandem stance x 30 seconds to demo improved narrow base of support control.   Baseline able to maintain tandem bilatrally x 30 seconds.   Time 4   Period Weeks   Status Achieved     PT LONG TERM GOAL #5   Title The patient will have SOT performed and will improve by 8% from baseline score.   Baseline d/c goal as patient scores WNLs on baseline testing.   Time 4   Period Weeks   Status Achieved               Plan - 03/20/17 1426    Clinical Impression Statement The patient met LTG for tandem stance and showed improvement on FGA.  PT continuing to emphasize muscular  endurance in ankles, ankle ROM, limits of stability and functional mobility.   PT Treatment/Interventions ADLs/Self Care Home Management;Therapeutic activities;Therapeutic exercise;Balance training;Neuromuscular re-education;Patient/family education;Gait training;Vestibular;Canalith Repostioning   PT Next Visit Plan hip stretching, check HEP, ankle lengthening, limits of stability training.  Continue checking LTGs, d/c plan?  hold for 1-2 weeks to check on status.   Consulted and Agree with Plan of Care Patient      Patient will benefit from skilled therapeutic intervention in order to improve the following deficits and impairments:  Abnormal gait, Decreased balance, Decreased strength, Dizziness, Difficulty walking  Visit Diagnosis: Other abnormalities of gait and mobility  Unsteadiness on feet  Dizziness and giddiness     Problem List Patient Active Problem List   Diagnosis Date Noted  . Gait abnormality 02/13/2017  . UTI (urinary tract infection) 11/22/2016  . Vertigo 11/22/2016  . Hypokalemia 11/22/2016  . Urinary frequency 09/08/2016  . Claudication ([place]) 04/24/2016  . Bilateral carotid artery disease ([place]) 08/07/2014  . Peripheral arterial disease ([place]) 08/06/2013  . GASTROPARESIS 02/25/2009  . COLONIC POLYPS, ADENOMATOUS, HX OF 02/22/2009  . HYPERCHOLESTEROLEMIA 09/26/2007  . Essential hypertension 09/26/2007  . Coronary atherosclerosis 09/26/2007  . ALLERGIC RHINITIS 09/26/2007  . GERD 09/26/2007  . Peptic ulcer 09/26/2007  . UMBILICAL HERNIA 31/59/4585  . NEPHROLITHIASIS 09/26/2007  . BLADDER STONE 09/26/2007    Gemini Beaumier, PT 03/20/2017, 2:30 PM  [facility] [address] [place], [place], [address] Phone: [phone]   Fax:  [phone]  Name: Nathan Santiago MRN: [id number] Date of Birth: August 19, 1930

## 2017-03-27 ENCOUNTER — Ambulatory Visit: Payer: Medicare Other | Admitting: Rehabilitative and Restorative Service Providers"

## 2017-03-27 DIAGNOSIS — R2689 Other abnormalities of gait and mobility: Secondary | ICD-10-CM | POA: Diagnosis not present

## 2017-03-27 DIAGNOSIS — R2681 Unsteadiness on feet: Secondary | ICD-10-CM

## 2017-03-27 DIAGNOSIS — R42 Dizziness and giddiness: Secondary | ICD-10-CM | POA: Diagnosis not present

## 2017-03-27 NOTE — Patient Instructions (Addendum)
Quads / HF, Supine    Lie near edge of bed, one leg bent, foot flat on bed. Other leg hanging over edge, relaxed, thigh resting entirely on bed. Bend hanging knee backward keeping thigh in contact with bed. Hold _20-30__ seconds.  Repeat _3__ times per session. Do 2 session per day.  Copyright  VHI. All rights reserved.    SINGLE LIMB STANCE    Stance: single leg on floor. Raise leg. Hold _10-15__ seconds. Repeat with other leg. _3__ reps per set, 2 times/day. STAND NEAR A COUNTERTOP FOR SUPPORT.  Copyright  VHI. All rights reserved.   ANKLE: Dorsiflexion - Standing    Stand with upright posture. Raise toes of both feet up at same time. _20__ reps per set, _2__ sets per day. Copyright  VHI. All rights reserved.     Electronically signed by Mervyn Gay, PT at 03/09/2017 3:40 PM    Heel Cord Stretch    Place one leg forward, bent, other leg behind and straight. Lean forward keeping back heel flat. Hold __30__ seconds while counting out loud. Repeat with other leg. Repeat _3___ times. Do _2___ sessions per day.  http://gt2.exer.us/511   Copyright  VHI. All rights reserved.   Hamstring Stretch, Seated (Strap, Two Chairs)    Sit with one leg extended onto facing chair.  NO STRAP needed.  Sit up tall and lean forward to feel stretch along back of leg. Hold x 20-30 seconds and rest.  Repeat 2-3 times on each side.   2 times/day. Copyright  VHI. All rights reserved.      Electronically signed by Mervyn Gay, PT at 03/14/2017 10:11 AM

## 2017-03-27 NOTE — Therapy (Signed)
[facility] [address] [place], [place], [address] Phone: [phone]   Fax:  [phone]  Physical Therapy Treatment  Patient Details  Name: Nathan Santiago MRN: [id number] Date of Birth: [birth year] Referring Provider: Margette Fast, MD  Encounter Date: 03/27/2017      PT End of Session - 03/27/17 1110    Visit Number 6   Number of Visits 7   Date for PT Re-Evaluation 04/21/17  length of stay 4 weeks, however no appt available next week   Authorization Type G code every 10th visit.   PT Start Time 1016   PT Stop Time 1101   PT Time Calculation (min) 45 min   Activity Tolerance Patient tolerated treatment well   Behavior During Therapy WFL for tasks assessed/performed      Past Medical History:  Diagnosis Date  . AAA (abdominal aortic aneurysm) (HCC)    asymptomatic  . AAA (abdominal aortic aneurysm) ([place]) 2010   peripheral  angiogram-- bilateral SFA DISEASE  and 60 to 70% infrarenaal abd. aortic stenosis with 15 -mm gradient  . Adenomatous colon polyp 11/2003  . CAD (coronary artery disease)   . Gait abnormality 02/13/2017  . Gastroparesis   . GERD (gastroesophageal reflux disease)    w/ LPR  . Hyperlipidemia   . Hypertension   . Peripheral arterial disease ([place])    RCEA  by Dr Lemar Livings  . PUD (peptic ulcer disease)   . Vertigo     Past Surgical History:  Procedure Laterality Date  . AORTOGRAM  04/24/2016    Abdominal aortogram, bilateral iliac angiogram, bifemoral runoff  . CARDIAC CATHETERIZATION  05/12/2005   RCA  . carotid doppler  01/24/2013   RICA endarterectomy,left CCA 0-49%; left bulb and prox ICA 50-69%; bilaateral subclavian < 50%  . CAROTID ENDARTERECTOMY  11/01/2010  . CATARACT EXTRACTION    . CORONARY ANGIOPLASTY  05/18/2005   2 STENTS distal RCA AND PROXIMAL-MID RCA  . DOPPLER ECHOCARDIOGRAPHY  02/07/2012   EF 55%,SHOWED NO ISCHEMIA   . lower arterial  doppler  02/04/2013    aotra 1.5 x 1.5 cm; distal abd aorta 70-99%,proximal common iliac arteries -very stenotic with increased velocities>50%,may be falsely elevated as a result of residual plaque from the distal aorta stenosis  . lower extremity doppler  June 18 ,2013   ABI'S ABNORMAL, RABI was 0.88 and LABI 0.75 ,with 3-vessel  run off  . NM MYOVIEW LTD  MAY [id number]   showed no significant ischemia;  . NM MYOVIEW LTD  04/22/2008   ef 77%,exercise capcity 6 METS ,exaggerated blood pressure response to exercise  . PERIPHERAL VASCULAR CATHETERIZATION N/A 04/24/2016   Procedure: Lower Extremity Angiography;  Surgeon: Lorretta Harp, MD;  Location: [facility];  Service: Cardiovascular;  Laterality: N/A;  . PERIPHERAL VASCULAR CATHETERIZATION  04/24/2016   Procedure: Peripheral Vascular Intervention;  Surgeon: Lorretta Harp, MD;  Location: [facility];  Service: Cardiovascular;;  Aorta  . retrograde central aortic catheterization  05/19/2005   cutting balloon atherectomy, c-circ stenosis with DES STENTING CYPHER  . TONSILLECTOMY    . TRANSURETHRAL RESECTION OF PROSTATE N/A 09/08/2016   Procedure: TRANSURETHRAL RESECTION OF THE PROSTATE (TURP);  Surgeon: Ardis Hughs, MD;  Location: [facility];  Service: Urology;  Laterality: N/A;    There were no vitals filed for this visit.      Subjective Assessment - 03/27/17 1023    Subjective The patient feels that balance is  improved, however stiffness is limiting him (notes in thighs and hips).    Pertinent History Reports having PT in past for hypofunction and it was effective in improving balance.   Patient Stated Goals "Be able to walk a straight line" (feels like he pulls to the left).                           [facility] Adult PT Treatment/Exercise - 03/27/17 1112      Ambulation/Gait   Ambulation/Gait Yes   Ambulation/Gait Assistance 6: Modified independent (Device/Increase time)   Ambulation Distance (Feet) 500 Feet   Assistive  device None   Ambulation Surface Level;Indoor;Outdoor;Unlevel   Gait Comments Performed community gait on rubber mulch, gravel, grass, level paved surfaces and pine straw.  Patient performed modified independently.  Patient performed curb negotiation outdoors with some unsteadiness noted that is able to self recover from.     Neuro Re-ed    Neuro Re-ed Details  Performed single limb stance loading activities for balance dec'ing UE support performing lateral stepups R and L x 5 reps, standing on ramp/decline/facing down with limit of stability with postural sway keeping hips and knees fixed for ankle control.  Stepping strategy anterior/posterior x 5 reps each direction and right/left x 5 reps each.  Step downs working on single limb control while tapping down posteriorly off 5" surface.  Performed hopping in jumping jack lateral<>medial hopping x 5 reps x 2 sets, then single limb heel raises emphasizing balance control while working on ankle strengthening.      Exercises   Exercises Other Exercises   Other Exercises  Prone hip flexor and quad stretch, thomas test stretch with passive overpressure.  Standing heel cord stretch at wall.  Discussed encouragement to stretch after performing nu step at home.                PT Education - 03/27/17 1110    Education provided Yes   Education Details Updated HEP:  removed VOR and added quad stretch   Person(s) Educated Patient   Methods Explanation;Demonstration;Handout   Comprehension Verbalized understanding;Returned demonstration             PT Long Term Goals - 03/27/17 1023      PT LONG TERM GOAL #1   Title The patient will be indep with HEP for gaze adaptation, high level balance and dynamic gait activities.   Time 4   Period Weeks     PT LONG TERM GOAL #2   Title The patient will improve FGA from 17/30 up to 22/30 to demonstrate improving dynamic gait activities.   Baseline 20/30 (improved from 17/30)   Time 4   Period  Weeks     PT LONG TERM GOAL #3   Title The patient will improve single leg stance to > or equal to 10 seconds bilaterally for improved balance control   Time 4   Period Weeks     PT LONG TERM GOAL #4   Title The patient will perform tandem stance x 30 seconds to demo improved narrow base of support control.   Baseline able to maintain tandem bilatrally x 30 seconds.   Time 4   Period Weeks   Status Achieved     PT LONG TERM GOAL #5   Title The patient will have SOT performed and will improve by 8% from baseline score.   Baseline d/c goal as patient scores WNLs on baseline testing.  Time 4   Period Weeks   Status Achieved               Plan - 03/27/17 1111    Clinical Impression Statement The patient continues with tightness in bilateral quadriceps musculature, heel cords and hamstrings.  PT continuing to address in clinic and adding activities for home to improve carryover.  Plan to continue towards iniital end date of 04/21/17 doing renewal (due to # of visits in initial plan of care) with updated goals anticipated to include thomas test quad measurements, heel cord length, and transition to wellness program at [facility]    PT Treatment/Interventions ADLs/Self Care Home Management;Therapeutic activities;Therapeutic exercise;Balance training;Neuromuscular re-education;Patient/family education;Gait training;Vestibular;Canalith Repostioning   PT Next Visit Plan hip stretching, check HEP, ankle lengthening, limits of stability training.  Continue checking LTGs, d/c plan?  hold for 1-2 weeks to check on status.   Consulted and Agree with Plan of Care Patient      Patient will benefit from skilled therapeutic intervention in order to improve the following deficits and impairments:  Abnormal gait, Decreased balance, Decreased strength, Dizziness, Difficulty walking  Visit Diagnosis: Other abnormalities of gait and mobility  Unsteadiness on feet     Problem List Patient  Active Problem List   Diagnosis Date Noted  . Gait abnormality 02/13/2017  . UTI (urinary tract infection) 11/22/2016  . Vertigo 11/22/2016  . Hypokalemia 11/22/2016  . Urinary frequency 09/08/2016  . Claudication ([place]) 04/24/2016  . Bilateral carotid artery disease ([place]) 08/07/2014  . Peripheral arterial disease ([place]) 08/06/2013  . GASTROPARESIS 02/25/2009  . COLONIC POLYPS, ADENOMATOUS, HX OF 02/22/2009  . HYPERCHOLESTEROLEMIA 09/26/2007  . Essential hypertension 09/26/2007  . Coronary atherosclerosis 09/26/2007  . ALLERGIC RHINITIS 09/26/2007  . GERD 09/26/2007  . Peptic ulcer 09/26/2007  . UMBILICAL HERNIA 70/96/2836  . NEPHROLITHIASIS 09/26/2007  . BLADDER STONE 09/26/2007    Woodward Klem, PT 03/27/2017, 11:16 AM  [facility] [address] [place], [place], [address] Phone: [phone]   Fax:  [phone]  Name: Nathan Santiago MRN: [id number] Date of Birth: 08/25/1930

## 2017-03-28 ENCOUNTER — Ambulatory Visit: Payer: Medicare Other | Admitting: Rehabilitative and Restorative Service Providers"

## 2017-03-28 DIAGNOSIS — R2689 Other abnormalities of gait and mobility: Secondary | ICD-10-CM | POA: Diagnosis not present

## 2017-03-28 DIAGNOSIS — R2681 Unsteadiness on feet: Secondary | ICD-10-CM

## 2017-03-28 DIAGNOSIS — R42 Dizziness and giddiness: Secondary | ICD-10-CM | POA: Diagnosis not present

## 2017-03-28 NOTE — Therapy (Signed)
[facility] [address] [place], [place], [address] Phone: [phone]   Fax:  [phone]  Physical Therapy Treatment  Patient Details  Name: Nathan Santiago MRN: [id number] Date of Birth: 1931/02/27 Referring Provider: Margette Fast, MD  Encounter Date: 03/28/2017      PT End of Session - 03/28/17 1949    Visit Number 7   Number of Visits 7   Date for PT Re-Evaluation 04/21/17  length of stay 4 weeks, however no appt available next week   Authorization Type G code every 10th visit.   PT Start Time 0935   PT Stop Time 1015   PT Time Calculation (min) 40 min   Activity Tolerance Patient tolerated treatment well   Behavior During Therapy WFL for tasks assessed/performed      Past Medical History:  Diagnosis Date  . AAA (abdominal aortic aneurysm) (HCC)    asymptomatic  . AAA (abdominal aortic aneurysm) ([place]) 2010   peripheral  angiogram-- bilateral SFA DISEASE  and 60 to 70% infrarenaal abd. aortic stenosis with 15 -mm gradient  . Adenomatous colon polyp 11/2003  . CAD (coronary artery disease)   . Gait abnormality 02/13/2017  . Gastroparesis   . GERD (gastroesophageal reflux disease)    w/ LPR  . Hyperlipidemia   . Hypertension   . Peripheral arterial disease ([place])    RCEA  by Dr Lemar Livings  . PUD (peptic ulcer disease)   . Vertigo     Past Surgical History:  Procedure Laterality Date  . AORTOGRAM  04/24/2016    Abdominal aortogram, bilateral iliac angiogram, bifemoral runoff  . CARDIAC CATHETERIZATION  05/12/2005   RCA  . carotid doppler  01/24/2013   RICA endarterectomy,left CCA 0-49%; left bulb and prox ICA 50-69%; bilaateral subclavian < 50%  . CAROTID ENDARTERECTOMY  11/01/2010  . CATARACT EXTRACTION    . CORONARY ANGIOPLASTY  05/18/2005   2 STENTS distal RCA AND PROXIMAL-MID RCA  . DOPPLER ECHOCARDIOGRAPHY  02/07/2012   EF 55%,SHOWED NO ISCHEMIA   . lower arterial  doppler  02/04/2013    aotra 1.5 x 1.5 cm; distal abd aorta 70-99%,proximal common iliac arteries -very stenotic with increased velocities>50%,may be falsely elevated as a result of residual plaque from the distal aorta stenosis  . lower extremity doppler  June 18 ,2013   ABI'S ABNORMAL, RABI was 0.88 and LABI 0.75 ,with 3-vessel  run off  . NM MYOVIEW LTD  MAY [id number]   showed no significant ischemia;  . NM MYOVIEW LTD  04/22/2008   ef 77%,exercise capcity 6 METS ,exaggerated blood pressure response to exercise  . PERIPHERAL VASCULAR CATHETERIZATION N/A 04/24/2016   Procedure: Lower Extremity Angiography;  Surgeon: Lorretta Harp, MD;  Location: [facility];  Service: Cardiovascular;  Laterality: N/A;  . PERIPHERAL VASCULAR CATHETERIZATION  04/24/2016   Procedure: Peripheral Vascular Intervention;  Surgeon: Lorretta Harp, MD;  Location: [facility];  Service: Cardiovascular;;  Aorta  . retrograde central aortic catheterization  05/19/2005   cutting balloon atherectomy, c-circ stenosis with DES STENTING CYPHER  . TONSILLECTOMY    . TRANSURETHRAL RESECTION OF PROSTATE N/A 09/08/2016   Procedure: TRANSURETHRAL RESECTION OF THE PROSTATE (TURP);  Surgeon: Ardis Hughs, MD;  Location: [facility];  Service: Urology;  Laterality: N/A;    There were no vitals filed for this visit.      Subjective Assessment - 03/28/17 0936    Subjective The patient notes he was sore  after therapy yesterday "everywhere".  He reports he walked on the treadmill this morning at fitness center.  He notes he feels safe on the treadmill.     Pertinent History Reports having PT in past for hypofunction and it was effective in improving balance.   Patient Stated Goals "Be able to walk a straight line" (feels like he pulls to the left).     Currently in Pain? No/denies            [facility] PT Assessment - 03/28/17 1001      Ambulation/Gait   Ambulation/Gait Assistance 6: Modified independent (Device/Increase time)    Ambulation Distance (Feet) 400 Feet   Assistive device None   Ambulation Surface Level;Indoor   Gait Comments Performed gait activities walking forwards/backwards encouraging longer stride, gait emphasizing increased amplitude of arm swing.                     [facility] PT Treatment/Exercise - 03/28/17 1001      Ambulation/Gait   Ambulation/Gait Yes     Neuro Re-ed    Neuro Re-ed Details  BOSU standing on solid surface with dec'ing UE support with CGA to min A.  Added head motion right<>left with min A, mini squats x 10 reps, and UE alternating reaching for proactive balance.  "up/up/down/down" x 10 reps right and left sides, step downs from 6" surface emphasizing single limb stance control with deceleration/motor control, marching/alternating with intermittent holds x 10 reps.  Backwards walking x 10 feet x 4 reps.      Exercises   Exercises Other Exercises   Other Exercises  Prone hip extension x 10 reps each leg with cues to avoid hip flexion.  Prone knee flexion x 10 reps right and left sides.  Physioball supine hip extension x 10 reps with cues.  Seated hip extension with sitting bridge with UE weight bearing.  Thomas test stretching with passive overpressure and contract/relax.                     PT Long Term Goals - 03/27/17 1023      PT LONG TERM GOAL #1   Title The patient will be indep with HEP for gaze adaptation, high level balance and dynamic gait activities.   Time 4   Period Weeks     PT LONG TERM GOAL #2   Title The patient will improve FGA from 17/30 up to 22/30 to demonstrate improving dynamic gait activities.   Baseline 20/30 (improved from 17/30)   Time 4   Period Weeks     PT LONG TERM GOAL #3   Title The patient will improve single leg stance to > or equal to 10 seconds bilaterally for improved balance control   Time 4   Period Weeks     PT LONG TERM GOAL #4   Title The patient will perform tandem stance x 30 seconds to demo  improved narrow base of support control.   Baseline able to maintain tandem bilatrally x 30 seconds.   Time 4   Period Weeks   Status Achieved     PT LONG TERM GOAL #5   Title The patient will have SOT performed and will improve by 8% from baseline score.   Baseline d/c goal as patient scores WNLs on baseline testing.   Time 4   Period Weeks   Status Achieved               Plan -  03/28/17 1957    Clinical Impression Statement The patient is making subjective progress noting longer stride and improved balance.  He continues with stiffness in hip flexors, hamstrings, and heel cords reporting tightness with functional activities.  PT anticipates renewing for 4-6 more visits to ensure transition to community program at [facility] and to further develop home program.    PT Treatment/Interventions ADLs/Self Care Home Management;Therapeutic activities;Therapeutic exercise;Balance training;Neuromuscular re-education;Patient/family education;Gait training;Vestibular;Canalith Repostioning   PT Next Visit Plan Renew next visit and send cert -- can update G code early, ankle strategies, limits of stability.  Consider decreasing frequency to 1x/week with emphasis on community   Consulted and Agree with Plan of Care Patient      Patient will benefit from skilled therapeutic intervention in order to improve the following deficits and impairments:  Abnormal gait, Decreased balance, Decreased strength, Dizziness, Difficulty walking  Visit Diagnosis: Other abnormalities of gait and mobility  Unsteadiness on feet  Dizziness and giddiness     Problem List Patient Active Problem List   Diagnosis Date Noted  . Gait abnormality 02/13/2017  . UTI (urinary tract infection) 11/22/2016  . Vertigo 11/22/2016  . Hypokalemia 11/22/2016  . Urinary frequency 09/08/2016  . Claudication ([place]) 04/24/2016  . Bilateral carotid artery disease ([place]) 08/07/2014  . Peripheral arterial disease ([place])  08/06/2013  . GASTROPARESIS 02/25/2009  . COLONIC POLYPS, ADENOMATOUS, HX OF 02/22/2009  . HYPERCHOLESTEROLEMIA 09/26/2007  . Essential hypertension 09/26/2007  . Coronary atherosclerosis 09/26/2007  . ALLERGIC RHINITIS 09/26/2007  . GERD 09/26/2007  . Peptic ulcer 09/26/2007  . UMBILICAL HERNIA 51/88/4166  . NEPHROLITHIASIS 09/26/2007  . BLADDER STONE 09/26/2007    Kameelah Minish, PT 03/28/2017, 7:59 PM  [facility] [address] [place], [place], [address] Phone: [phone]   Fax:  [phone]  Name: MIKA ANASTASI MRN: [id number] Date of Birth: 12/22/1930

## 2017-03-29 ENCOUNTER — Encounter: Payer: Medicare Other | Admitting: Rehabilitative and Restorative Service Providers"

## 2017-03-30 ENCOUNTER — Encounter: Payer: Self-pay | Admitting: Rehabilitative and Restorative Service Providers"

## 2017-04-04 ENCOUNTER — Ambulatory Visit: Payer: Medicare Other | Attending: Neurology | Admitting: Rehabilitative and Restorative Service Providers"

## 2017-04-04 DIAGNOSIS — R2681 Unsteadiness on feet: Secondary | ICD-10-CM | POA: Insufficient documentation

## 2017-04-04 DIAGNOSIS — R42 Dizziness and giddiness: Secondary | ICD-10-CM | POA: Insufficient documentation

## 2017-04-04 DIAGNOSIS — R2689 Other abnormalities of gait and mobility: Secondary | ICD-10-CM | POA: Insufficient documentation

## 2017-04-04 NOTE — Therapy (Signed)
[facility] [address] [place], [place], [address] Phone: [phone]   Fax:  [phone]  Physical Therapy Treatment  Patient Details  Name: Nathan Santiago MRN: [id number] Date of Birth: [birth year] Referring Provider: Margette Fast, MD  Encounter Date: 04/04/2017      PT End of Session - 04/04/17 1457    Visit Number 8   Number of Visits 14   Date for PT Re-Evaluation 05/04/17  length of stay 4 weeks, however no appt available next week   Authorization Type G code every 10th visit.   PT Start Time [phone]   PT Stop Time 1016   PT Time Calculation (min) 40 min   Activity Tolerance Patient tolerated treatment well   Behavior During Therapy WFL for tasks assessed/performed      Past Medical History:  Diagnosis Date  . AAA (abdominal aortic aneurysm) (HCC)    asymptomatic  . AAA (abdominal aortic aneurysm) ([place]) 2010   peripheral  angiogram-- bilateral SFA DISEASE  and 60 to 70% infrarenaal abd. aortic stenosis with 15 -mm gradient  . Adenomatous colon polyp 11/2003  . CAD (coronary artery disease)   . Gait abnormality 02/13/2017  . Gastroparesis   . GERD (gastroesophageal reflux disease)    w/ LPR  . Hyperlipidemia   . Hypertension   . Peripheral arterial disease ([place])    RCEA  by Dr Lemar Livings  . PUD (peptic ulcer disease)   . Vertigo     Past Surgical History:  Procedure Laterality Date  . AORTOGRAM  04/24/2016    Abdominal aortogram, bilateral iliac angiogram, bifemoral runoff  . CARDIAC CATHETERIZATION  05/12/2005   RCA  . carotid doppler  01/24/2013   RICA endarterectomy,left CCA 0-49%; left bulb and prox ICA 50-69%; bilaateral subclavian < 50%  . CAROTID ENDARTERECTOMY  11/01/2010  . CATARACT EXTRACTION    . CORONARY ANGIOPLASTY  05/18/2005   2 STENTS distal RCA AND PROXIMAL-MID RCA  . DOPPLER ECHOCARDIOGRAPHY  02/07/2012   EF 55%,SHOWED NO ISCHEMIA   . lower arterial  doppler  02/04/2013    aotra 1.5 x 1.5 cm; distal abd aorta 70-99%,proximal common iliac arteries -very stenotic with increased velocities>50%,may be falsely elevated as a result of residual plaque from the distal aorta stenosis  . lower extremity doppler  June 18 ,2013   ABI'S ABNORMAL, RABI was 0.88 and LABI 0.75 ,with 3-vessel  run off  . NM MYOVIEW LTD  MAY [id number]   showed no significant ischemia;  . NM MYOVIEW LTD  04/22/2008   ef 77%,exercise capcity 6 METS ,exaggerated blood pressure response to exercise  . PERIPHERAL VASCULAR CATHETERIZATION N/A 04/24/2016   Procedure: Lower Extremity Angiography;  Surgeon: Lorretta Harp, MD;  Location: [facility];  Service: Cardiovascular;  Laterality: N/A;  . PERIPHERAL VASCULAR CATHETERIZATION  04/24/2016   Procedure: Peripheral Vascular Intervention;  Surgeon: Lorretta Harp, MD;  Location: [facility];  Service: Cardiovascular;;  Aorta  . retrograde central aortic catheterization  05/19/2005   cutting balloon atherectomy, c-circ stenosis with DES STENTING CYPHER  . TONSILLECTOMY    . TRANSURETHRAL RESECTION OF PROSTATE N/A 09/08/2016   Procedure: TRANSURETHRAL RESECTION OF THE PROSTATE (TURP);  Surgeon: Ardis Hughs, MD;  [facility]: [facility];  Service: Urology;  Laterality: N/A;    There were no vitals filed for this visit.      Subjective Assessment - 04/04/17 0939    Subjective The patient reports that he was  able to walk around [place] without significant limitations. He notes soreness in quadriceps.  He walked on treadmill and used nustep this morning ( 8 minutes and then 15 minutes on nustep).  "These legs" have gone back to stiff.    Patient Stated Goals "Be able to walk a straight line" (feels like he pulls to the left).     Currently in Pain? No/denies            [facility] PT Assessment - 04/04/17 0946      Functional Gait  Assessment   Gait assessed  Yes   Gait Level Surface Walks 20 ft in less than 7 sec but greater than  5.5 sec, uses assistive device, slower speed, mild gait deviations, or deviates 6-10 in outside of the 12 in walkway width.   Change in Gait Speed Able to smoothly change walking speed without loss of balance or gait deviation. Deviate no more than 6 in outside of the 12 in walkway width.   Gait with Horizontal Head Turns Performs head turns with moderate changes in gait velocity, slows down, deviates 10-15 in outside 12 in walkway width but recovers, can continue to walk.   Gait with Vertical Head Turns Performs task with slight change in gait velocity (eg, minor disruption to smooth gait path), deviates 6 - 10 in outside 12 in walkway width or uses assistive device   Gait and Pivot Turn Pivot turns safely within 3 sec and stops quickly with no loss of balance.   Step Over Obstacle Is able to step over one shoe box (4.5 in total height) without changing gait speed. No evidence of imbalance.   Gait with Narrow Base of Support Ambulates 7-9 steps.   Gait with Eyes Closed Walks 20 ft, no assistive devices, good speed, no evidence of imbalance, normal gait pattern, deviates no more than 6 in outside 12 in walkway width. Ambulates 20 ft in less than 7 sec.   Ambulating Backwards Walks 20 ft, no assistive devices, good speed, no evidence for imbalance, normal gait   Steps Alternating feet, must use rail.   Total Score 23   FGA comment: 23/30 improved from 17/30                     [facility] Adult PT Treatment/Exercise - 04/04/17 1202      Ambulation/Gait   Ambulation/Gait Yes   Ambulation/Gait Assistance 6: Modified independent (Device/Increase time)   Ambulation Distance (Feet) 500 Feet   Assistive device None   Gait Comments Encouraged longer stride length, larger amplitude arm swing with gait activities.     Self-Care   Self-Care Other Self-Care Comments   Other Self-Care Comments  Discussed long term plan emphasizing need to vary activities for post d/c.  Patient has added treadmill  walkingn to routine.  He may benefit from classes (such as yoga at [facility]) for continued work on flexibility post d/c.      Neuro Re-ed    Neuro Re-ed Details  Standing on ramp working on toe taps posteriorly alternating and then anteriorly for weight shifting/balance.  Single limb stance activities near support surface x 8 seconds to 10 seconds with repetition.  Standing tandem stance x 30 seconds near support surface.     Exercises   Exercises Other Exercises   Other Exercises  Thomas test stretch with added knee flexion with passive overpressure x 2 reps each side, supine PROM hamstring stretch 2 reps each side, standing heel cord  stretch reviewed, standing modified down dog stretch at countertop, standing hip flexor stretch, standing hip adductor stretch leaning at countertop.                     PT Long Term Goals - 04/04/17 0940      PT LONG TERM GOAL #1   Title The patient will be indep with HEP for gaze adaptation, high level balance and dynamic gait activities.   Time 4   Period Weeks   Status Achieved     PT LONG TERM GOAL #2   Title The patient will improve FGA from 17/30 up to 22/30 to demonstrate improving dynamic gait activities.   Baseline 20/30 (improved from 17/30)  On 9/5, scores 23/30   Time 4   Period Weeks   Status Achieved     PT LONG TERM GOAL #3   Title The patient will improve single leg stance to > or equal to 10 seconds bilaterally for improved balance control   Baseline Took multiple attempts, but able to perform using visual spotting.   Time 4   Period Weeks   Status Achieved     PT LONG TERM GOAL #4   Title The patient will perform tandem stance x 30 seconds to demo improved narrow base of support control.   Baseline able to maintain tandem bilatrally x 30 seconds.   Time 4   Period Weeks   Status Achieved     PT LONG TERM GOAL #5   Title The patient will have SOT performed and will improve by 8% from baseline score.   Baseline  d/c goal as patient scores WNLs on baseline testing.   Time 4   Period Weeks   Status Achieved       UPDATED LONG TERM GOALS:     PT Long Term Goals - 04/04/17 1502      PT LONG TERM GOAL #1   Title The patient will transition to community classes at [facility] for post d/c plan   Time 4   Period Weeks   Status New   Target Date 05/04/17     PT LONG TERM GOAL #2   Title The patient will improve FGA from 23/30 up to 25/30 to demo improving dynamic gait.   Time 4   Period Weeks   Status New   Target Date 05/04/17     PT LONG TERM GOAL #3   Title The patient will be indep with progression of HEP for flexibility and balance.   Time 4   Status New   Target Date 05/04/17             Plan - 04/04/17 1459    Clinical Impression Statement The patient met LTGs.  He continues with general LE "stiffness" and muscle shortening in quads, hip flexors, hamstrings and heel cords that hinder functional moblity (stepping down steps, walking for longer distances).  PT to renew x 4 more weeks with main emphasis on transitioning to appropriate classes at living community for improved long term success.    Rehab Potential Good   PT Frequency 2x / week   PT Duration 4 weeks  Adding 6 visits total if needed to plan of care --will reduce to 1x/week as performs more activities in community   PT Treatment/Interventions ADLs/Self Care Home Management;Therapeutic activities;Therapeutic exercise;Balance training;Neuromuscular re-education;Patient/family education;Gait training;Vestibular;Canalith Repostioning   PT Next Visit Plan Ankle strategies for limits of stability, flexibility/stretching LEs, functional mobility/balance.  Consulted and Agree with Plan of Care Patient      Patient will benefit from skilled therapeutic intervention in order to improve the following deficits and impairments:  Abnormal gait, Decreased balance, Decreased strength, Difficulty walking, Impaired  flexibility  Visit Diagnosis: Other abnormalities of gait and mobility  Unsteadiness on feet  Dizziness and giddiness     Problem List Patient Active Problem List   Diagnosis Date Noted  . Gait abnormality 02/13/2017  . UTI (urinary tract infection) 11/22/2016  . Vertigo 11/22/2016  . Hypokalemia 11/22/2016  . Urinary frequency 09/08/2016  . Claudication ([place]) 04/24/2016  . Bilateral carotid artery disease ([place]) 08/07/2014  . Peripheral arterial disease ([place]) 08/06/2013  . GASTROPARESIS 02/25/2009  . COLONIC POLYPS, ADENOMATOUS, HX OF 02/22/2009  . HYPERCHOLESTEROLEMIA 09/26/2007  . Essential hypertension 09/26/2007  . Coronary atherosclerosis 09/26/2007  . ALLERGIC RHINITIS 09/26/2007  . GERD 09/26/2007  . Peptic ulcer 09/26/2007  . UMBILICAL HERNIA 05/69/7948  . NEPHROLITHIASIS 09/26/2007  . BLADDER STONE 09/26/2007    Farryn Linares, PT 04/04/2017, 3:01 PM  [facility] [address] [place], [place], [address] Phone: [phone]   Fax:  [phone]  Name: Nathan Santiago MRN: [id number] Date of Birth: June 08, 1931

## 2017-04-06 ENCOUNTER — Ambulatory Visit: Payer: Medicare Other | Admitting: Rehabilitative and Restorative Service Providers"

## 2017-04-09 ENCOUNTER — Ambulatory Visit: Payer: Medicare Other | Admitting: Rehabilitative and Restorative Service Providers"

## 2017-04-09 DIAGNOSIS — R42 Dizziness and giddiness: Secondary | ICD-10-CM

## 2017-04-09 DIAGNOSIS — R2689 Other abnormalities of gait and mobility: Secondary | ICD-10-CM

## 2017-04-09 DIAGNOSIS — R2681 Unsteadiness on feet: Secondary | ICD-10-CM | POA: Diagnosis not present

## 2017-04-09 NOTE — Therapy (Signed)
[facility] [address] [place], [place], [address] Phone: [phone]   Fax:  [phone]  Physical Therapy Treatment  Patient Details  Name: Nathan Santiago MRN: [id number] Date of Birth: [birth year] Referring Provider: Margette Fast, MD  Encounter Date: 04/09/2017      PT End of Session - 04/09/17 1519    Visit Number 9   Number of Visits 14   Date for PT Re-Evaluation 05/04/17  length of stay 4 weeks, however no appt available next week   Authorization Type G code every 10th visit.   PT Start Time 1320   PT Stop Time 1405   PT Time Calculation (min) 45 min   Activity Tolerance Patient tolerated treatment well   Behavior During Therapy WFL for tasks assessed/performed      Past Medical History:  Diagnosis Date  . AAA (abdominal aortic aneurysm) (HCC)    asymptomatic  . AAA (abdominal aortic aneurysm) ([place]) 2010   peripheral  angiogram-- bilateral SFA DISEASE  and 60 to 70% infrarenaal abd. aortic stenosis with 15 -mm gradient  . Adenomatous colon polyp 11/2003  . CAD (coronary artery disease)   . Gait abnormality 02/13/2017  . Gastroparesis   . GERD (gastroesophageal reflux disease)    w/ LPR  . Hyperlipidemia   . Hypertension   . Peripheral arterial disease ([place])    RCEA  by Dr Lemar Livings  . PUD (peptic ulcer disease)   . Vertigo     Past Surgical History:  Procedure Laterality Date  . AORTOGRAM  04/24/2016    Abdominal aortogram, bilateral iliac angiogram, bifemoral runoff  . CARDIAC CATHETERIZATION  05/12/2005   RCA  . carotid doppler  01/24/2013   RICA endarterectomy,left CCA 0-49%; left bulb and prox ICA 50-69%; bilaateral subclavian < 50%  . CAROTID ENDARTERECTOMY  11/01/2010  . CATARACT EXTRACTION    . CORONARY ANGIOPLASTY  05/18/2005   2 STENTS distal RCA AND PROXIMAL-MID RCA  . DOPPLER ECHOCARDIOGRAPHY  02/07/2012   EF 55%,SHOWED NO ISCHEMIA   . lower arterial  doppler  02/04/2013    aotra 1.5 x 1.5 cm; distal abd aorta 70-99%,proximal common iliac arteries -very stenotic with increased velocities>50%,may be falsely elevated as a result of residual plaque from the distal aorta stenosis  . lower extremity doppler  June 18 ,2013   ABI'S ABNORMAL, RABI was 0.88 and LABI 0.75 ,with 3-vessel  run off  . NM MYOVIEW LTD  MAY [id number]   showed no significant ischemia;  . NM MYOVIEW LTD  04/22/2008   ef 77%,exercise capcity 6 METS ,exaggerated blood pressure response to exercise  . PERIPHERAL VASCULAR CATHETERIZATION N/A 04/24/2016   Procedure: Lower Extremity Angiography;  Surgeon: Lorretta Harp, MD;  Location: [facility];  Service: Cardiovascular;  Laterality: N/A;  . PERIPHERAL VASCULAR CATHETERIZATION  04/24/2016   Procedure: Peripheral Vascular Intervention;  Surgeon: Lorretta Harp, MD;  Location: [facility];  Service: Cardiovascular;;  Aorta  . retrograde central aortic catheterization  05/19/2005   cutting balloon atherectomy, c-circ stenosis with DES STENTING CYPHER  . TONSILLECTOMY    . TRANSURETHRAL RESECTION OF PROSTATE N/A 09/08/2016   Procedure: TRANSURETHRAL RESECTION OF THE PROSTATE (TURP);  Surgeon: Ardis Hughs, MD;  Location: [facility];  Service: Urology;  Laterality: N/A;    There were no vitals filed for this visit.      Subjective Assessment - 04/09/17 1319    Subjective The patient notes that he is  gettingn longer stride length with gait.  He brought list of classes from [facility].    Pertinent History Reports having PT in past for hypofunction and it was effective in improving balance.   Patient Stated Goals "Be able to walk a straight line" (feels like he pulls to the left).     Currently in Pain? No/denies                         [facility] Adult PT Treatment/Exercise - 04/09/17 1520      Ambulation/Gait   Ambulation/Gait Yes   Ambulation/Gait Assistance 6: Modified independent (Device/Increase time)    Ambulation Distance (Feet) 500 Feet   Assistive device None   Ambulation Surface Level;Indoor   Gait Comments Dynamic gait activites including heel walking x 50 ft, toe walking x 50 ft x 3 reps, backwards walking, tandem gait, marching, head motion horizontal/vertical with supervision.     Neuro Re-ed    Neuro Re-ed Details  Compliant surface marching in place identifying playing cards with right/left/diagonal head motions.  Single leg stance x 10 seconds bilaterally.  Standing on ramp performing step/downs, compliant surface standing with alternating step downs.  Marching on incline for heel cord stretch and single leg control.     Exercises   Exercises Other Exercises   Other Exercises  Thomas test stretch with added knee flexion bilaterally, PROM hamstring stretch, standing gastrocs stretching with UE support.  Seated ankle eversion strengthening with green theraband.                PT Education - 04/09/17 1519    Education provided Yes   Education Details Discussed community wellness opportunities at [employer]) Educated Patient   Methods Explanation   Comprehension Verbalized understanding             PT Long Term Goals - 04/04/17 1502      PT LONG TERM GOAL #1   Title The patient will transition to community classes at [facility] for post d/c plan   Time 4   Period Weeks   Status New   Target Date 05/04/17     PT LONG TERM GOAL #2   Title The patient will improve FGA from 23/30 up to 25/30 to demo improving dynamic gait.   Time 4   Period Weeks   Status New   Target Date 05/04/17     PT LONG TERM GOAL #3   Title The patient will be indep with progression of HEP for flexibility and balance.   Time 4   Status New   Target Date 05/04/17               Plan - 04/09/17 1523    Clinical Impression Statement The patient is going to attend classes this week (Tues AM, Wed PM, Fri PM) at [employer]  He is specifically going to ensure LE  stretching and ankle strengthening as these are key components to success post d/c from PT.  Patient to f/u in one week to ensure going well and will d/c or modify plan as needed.    PT Treatment/Interventions ADLs/Self Care Home Management;Therapeutic activities;Therapeutic exercise;Balance training;Neuromuscular re-education;Patient/family education;Gait training;Vestibular;Canalith Repostioning   PT Next Visit Plan Check program, ankle strengthening, LE flexibility.   Consulted and Agree with Plan of Care Patient      Patient will benefit from skilled therapeutic intervention in order to improve the following deficits and impairments:  Abnormal gait, Decreased balance, Decreased  strength, Difficulty walking, Impaired flexibility  Visit Diagnosis: Other abnormalities of gait and mobility  Unsteadiness on feet  Dizziness and giddiness     Problem List Patient Active Problem List   Diagnosis Date Noted  . Gait abnormality 02/13/2017  . UTI (urinary tract infection) 11/22/2016  . Vertigo 11/22/2016  . Hypokalemia 11/22/2016  . Urinary frequency 09/08/2016  . Claudication ([place]) 04/24/2016  . Bilateral carotid artery disease ([place]) 08/07/2014  . Peripheral arterial disease ([place]) 08/06/2013  . GASTROPARESIS 02/25/2009  . COLONIC POLYPS, ADENOMATOUS, HX OF 02/22/2009  . HYPERCHOLESTEROLEMIA 09/26/2007  . Essential hypertension 09/26/2007  . Coronary atherosclerosis 09/26/2007  . ALLERGIC RHINITIS 09/26/2007  . GERD 09/26/2007  . Peptic ulcer 09/26/2007  . UMBILICAL HERNIA 29/08/1113  . NEPHROLITHIASIS 09/26/2007  . BLADDER STONE 09/26/2007    Guida Asman, PT 04/09/2017, 3:24 PM  [facility] [address] [place], [place], [address] Phone: [phone]   Fax:  [phone]  Name: Nathan Santiago MRN: [id number] Date of Birth: January 25, 1931

## 2017-04-11 ENCOUNTER — Encounter: Payer: Medicare Other | Admitting: Rehabilitative and Restorative Service Providers"

## 2017-04-13 DIAGNOSIS — R310 Gross hematuria: Secondary | ICD-10-CM | POA: Diagnosis not present

## 2017-04-20 ENCOUNTER — Ambulatory Visit: Payer: Medicare Other | Admitting: Rehabilitative and Restorative Service Providers"

## 2017-04-20 DIAGNOSIS — R2681 Unsteadiness on feet: Secondary | ICD-10-CM | POA: Diagnosis not present

## 2017-04-20 DIAGNOSIS — R2689 Other abnormalities of gait and mobility: Secondary | ICD-10-CM | POA: Diagnosis not present

## 2017-04-20 DIAGNOSIS — R42 Dizziness and giddiness: Secondary | ICD-10-CM

## 2017-04-20 NOTE — Patient Instructions (Signed)
Quads / HF, Supine    Lie near edge of bed, one leg bent, foot flat on bed. Other leg hanging over edge, relaxed, thigh resting entirely on bed. Bend hanging knee backward keeping thigh in contact with bed. Hold _20-30__ seconds.  Repeat _3__ times per session. Do 2 session per day.  Copyright  VHI. All rights reserved.       SINGLE LIMB STANCE    Stance: single leg on floor. Raise leg. Hold _10-15__ seconds. Repeat with other leg. _3__ reps per set, 2 times/day. STAND NEAR A COUNTERTOP FOR SUPPORT.  Copyright  VHI. All rights reserved.

## 2017-04-20 NOTE — Therapy (Signed)
[facility] [address] [place], [place], [address] Phone: [phone]   Fax:  [phone]  Physical Therapy Treatment and Discharge Summary  Patient Details  Name: Nathan Santiago MRN: [id number] Date of Birth: 01/05/1931 Referring Provider: Margette Fast, MD  Encounter Date: 04/20/2017      PT End of Session - 04/20/17 1004    Visit Number 10   Number of Visits 14   Date for PT Re-Evaluation 05/04/17  length of stay 4 weeks, however no appt available next week   Authorization Type G code every 10th visit.   PT Start Time [phone]   PT Stop Time 1008   PT Time Calculation (min) 30 min   Activity Tolerance Patient tolerated treatment well   Behavior During Therapy WFL for tasks assessed/performed      Past Medical History:  Diagnosis Date  . AAA (abdominal aortic aneurysm) (HCC)    asymptomatic  . AAA (abdominal aortic aneurysm) ([place]) 2010   peripheral  angiogram-- bilateral SFA DISEASE  and 60 to 70% infrarenaal abd. aortic stenosis with 15 -mm gradient  . Adenomatous colon polyp 11/2003  . CAD (coronary artery disease)   . Gait abnormality 02/13/2017  . Gastroparesis   . GERD (gastroesophageal reflux disease)    w/ LPR  . Hyperlipidemia   . Hypertension   . Peripheral arterial disease ([place])    RCEA  by Dr Lemar Livings  . PUD (peptic ulcer disease)   . Vertigo     Past Surgical History:  Procedure Laterality Date  . AORTOGRAM  04/24/2016    Abdominal aortogram, bilateral iliac angiogram, bifemoral runoff  . CARDIAC CATHETERIZATION  05/12/2005   RCA  . carotid doppler  01/24/2013   RICA endarterectomy,left CCA 0-49%; left bulb and prox ICA 50-69%; bilaateral subclavian < 50%  . CAROTID ENDARTERECTOMY  11/01/2010  . CATARACT EXTRACTION    . CORONARY ANGIOPLASTY  05/18/2005   2 STENTS distal RCA AND PROXIMAL-MID RCA  . DOPPLER ECHOCARDIOGRAPHY  02/07/2012   EF 55%,SHOWED NO ISCHEMIA   . lower  arterial  doppler  02/04/2013   aotra 1.5 x 1.5 cm; distal abd aorta 70-99%,proximal common iliac arteries -very stenotic with increased velocities>50%,may be falsely elevated as a result of residual plaque from the distal aorta stenosis  . lower extremity doppler  June 18 ,2013   ABI'S ABNORMAL, RABI was 0.88 and LABI 0.75 ,with 3-vessel  run off  . NM MYOVIEW LTD  MAY [id number]   showed no significant ischemia;  . NM MYOVIEW LTD  04/22/2008   ef 77%,exercise capcity 6 METS ,exaggerated blood pressure response to exercise  . PERIPHERAL VASCULAR CATHETERIZATION N/A 04/24/2016   Procedure: Lower Extremity Angiography;  Surgeon: Lorretta Harp, MD;  Location: [facility];  Service: Cardiovascular;  Laterality: N/A;  . PERIPHERAL VASCULAR CATHETERIZATION  04/24/2016   Procedure: Peripheral Vascular Intervention;  Surgeon: Lorretta Harp, MD;  Location: [facility];  Service: Cardiovascular;;  Aorta  . retrograde central aortic catheterization  05/19/2005   cutting balloon atherectomy, c-circ stenosis with DES STENTING CYPHER  . TONSILLECTOMY    . TRANSURETHRAL RESECTION OF PROSTATE N/A 09/08/2016   Procedure: TRANSURETHRAL RESECTION OF THE PROSTATE (TURP);  Surgeon: Ardis Hughs, MD;  [facility];  Service: Urology;  Laterality: N/A;    There were no vitals filed for this visit.      Subjective Assessment - 04/20/17 0929    Subjective The patient notes  that he has been doing classes at [facility] and feels that he got a well rounded workout.    Pertinent History Reports having PT in past for hypofunction and it was effective in improving balance.   Patient Stated Goals "Be able to walk a straight line" (feels like he pulls to the left).     Currently in Pain? No/denies                         [facility] Adult PT Treatment/Exercise - May 09, 2017 0943      Ambulation/Gait   Ambulation/Gait Yes   Ambulation/Gait Assistance 6: Modified independent  (Device/Increase time)   Ambulation Distance (Feet) 400 Feet   Assistive device None   Ambulation Surface Level;Indoor   Gait Comments Patient reports his tendency is to reduce stride length and he continues to focus on longer stride with gait activites.      Neuro Re-ed    Neuro Re-ed Details  single leg standing, tandem standing     Exercises   Exercises Other Exercises   Other Exercises  SUPINE:  hamstring stretch with passive overpressure, hip ER x 3 reps each side, reviewed thomaas test stretch.  Attempted standing quad stretch, however not safe for home.                 PT Education - 05/09/2017 0959    Education provided Yes   Education Details HEP: discussed post d/c home exercises and community wellness   Person(s) Educated Patient   Methods Explanation;Demonstration;Handout   Comprehension Verbalized understanding;Returned demonstration             PT Long Term Goals - May 09, 2017 1700      PT LONG TERM GOAL #1   Title The patient will transition to community classes at [facility] for post d/c plan   Baseline Patient has tried    Time 4   Period Weeks   Status Achieved     PT LONG TERM GOAL #2   Title The patient will improve FGA from 23/30 up to 25/30 to demo improving dynamic gait.   Baseline Patient verbally reports improved functional mobility and is able to continue working towards activities at home/community classes.   Time 4   Period Weeks   Status Deferred     PT LONG TERM GOAL #3   Title The patient will be indep with progression of HEP for flexibility and balance.   Time 4   Status Achieved               Plan - 05/09/17 1955    Clinical Impression Statement The patient has made significant gains in balance and gait, however continues with muscle tightness.  PT has educated in comprehensive home program and recommended wellness classes at [facility] to continue post d/c.   PT Treatment/Interventions ADLs/Self Care Home  Management;Therapeutic activities;Therapeutic exercise;Balance training;Neuromuscular re-education;Patient/family education;Gait training;Vestibular;Canalith Repostioning   PT Next Visit Plan Discharge today   Consulted and Agree with Plan of Care Patient      Patient will benefit from skilled therapeutic intervention in order to improve the following deficits and impairments:  Abnormal gait, Decreased balance, Decreased strength, Difficulty walking, Impaired flexibility  Visit Diagnosis: Other abnormalities of gait and mobility  Unsteadiness on feet  Dizziness and giddiness       G-Codes - 05/09/2017 1010    Functional Assessment Tool Used (Outpatient Only) fga=23/30 on 04/04/17   Functional Limitation Mobility: Walking and moving around  Mobility: Walking and Moving Around Current Status [phone]) At least 20 percent but less than 40 percent impaired, limited or restricted   Mobility: Walking and Moving Around Goal Status [phone]) At least 20 percent but less than 40 percent impaired, limited or restricted   Mobility: Walking and Moving Around Discharge Status [phone]) At least 1 percent but less than 20 percent impaired, limited or restricted     PHYSICAL THERAPY DISCHARGE SUMMARY  Visits from Start of Care: 10  Current functional level related to goals / functional outcomes: See above   Remaining deficits: LE tightness- addressed per HEP and community wellness   Education / Equipment: Home program, exercises classes post d/c.   Plan: Patient agrees to discharge.  Patient goals were met. Patient is being discharged due to meeting the stated rehab goals.  ?????        Thank you for the referral of this patient. Rudell Cobb, MPT    [place], PT 04/20/2017, 7:58 PM  [facility] [address] [place], [place], [address] Phone: [phone]   Fax:  [phone]  Name: JARIAN LONGORIA MRN:  [id number] Date of Birth: [birth year]

## 2017-04-25 ENCOUNTER — Encounter: Payer: Medicare Other | Admitting: Rehabilitative and Restorative Service Providers"

## 2017-04-26 ENCOUNTER — Telehealth: Payer: Self-pay | Admitting: Cardiovascular Disease

## 2017-04-26 NOTE — Telephone Encounter (Signed)
Called patient. Communicated recommendation to hold plavix starting today, and defer to Dr. Dayna Ramus for further instruction. Pt verbalized understanding and thanks. Aware to call back if needing further assistance/advice from Dr. Gwenlyn Found.

## 2017-04-26 NOTE — Telephone Encounter (Signed)
Ok to interrupt plavix

## 2017-04-26 NOTE — Telephone Encounter (Signed)
New message    Pt c/o medication issue:  1. Name of Medication: plavix   2. How are you currently taking this medication (dosage and times per day)? 75 mg  3. Are you having a reaction (difficulty breathing--STAT)? no  4. What is your medication issue? Pt states that he is having some bleeding and Dr. Dayna Ramus wants him to stop it for a short while. Please call.

## 2017-04-26 NOTE — Telephone Encounter (Signed)
S/w pt he is having some blood in his urine and Dr. Dayna Ramus would like for him to stop it for a short while. Pt states that he thinks has done this before and Dr Gwenlyn Found approved and this helped. He states that he thinks that he had stopped for 5 days before. He states that Dr. Dayna Ramus wants to try this and see if it helps before he does a procedure. Ok to stop again?

## 2017-04-27 DIAGNOSIS — Z23 Encounter for immunization: Secondary | ICD-10-CM | POA: Diagnosis not present

## 2017-05-11 DIAGNOSIS — R3914 Feeling of incomplete bladder emptying: Secondary | ICD-10-CM | POA: Diagnosis not present

## 2017-05-11 DIAGNOSIS — R310 Gross hematuria: Secondary | ICD-10-CM | POA: Diagnosis not present

## 2017-05-23 ENCOUNTER — Encounter: Payer: Self-pay | Admitting: Cardiovascular Disease

## 2017-05-23 ENCOUNTER — Ambulatory Visit (INDEPENDENT_AMBULATORY_CARE_PROVIDER_SITE_OTHER): Payer: Medicare Other | Admitting: Cardiovascular Disease

## 2017-05-23 VITALS — BP 157/64 | HR 63 | Ht 65.0 in | Wt 179.4 lb

## 2017-05-23 DIAGNOSIS — I10 Essential (primary) hypertension: Secondary | ICD-10-CM | POA: Diagnosis not present

## 2017-05-23 DIAGNOSIS — I2510 Atherosclerotic heart disease of native coronary artery without angina pectoris: Secondary | ICD-10-CM

## 2017-05-23 DIAGNOSIS — I739 Peripheral vascular disease, unspecified: Secondary | ICD-10-CM

## 2017-05-23 DIAGNOSIS — E7800 Pure hypercholesterolemia, unspecified: Secondary | ICD-10-CM | POA: Diagnosis not present

## 2017-05-23 DIAGNOSIS — I6523 Occlusion and stenosis of bilateral carotid arteries: Secondary | ICD-10-CM

## 2017-05-23 NOTE — Progress Notes (Signed)
05/23/2017 [facility]   02-26-1931  [id number]  Primary Physician [name], MD Primary Cardiologist: [name] Lupe Carney, [place]  HPI:  Nathan Santiago is a 83 y.o. 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 He was formally a patient of [name]. I last saw him in the office 05/23/16.[name] His cardiovascular status profile is remarkable for remote tobacco abuse, 2 hypertension and hyperlipidemia. He is not diabetic. [place] 5 days a week 25 minutes a day. He hasn't known cardiovascular disease status post stenting of his proximal mid and distal RCA using overlapping stents as well as the proximal AV groove circumflex with a known occluded OM branch which is old. He has normal LV function by 2-D echo performed 02/07/12 a nonischemic Myoview 2010. He denies chest pain, shortness of breath or claudication. He had carotid Dopplers performed 01/09/14 that showed a widely patent right carotid endarterectomy site with moderate left ICA stenosis remained stable. Since I saw him in January of this year he's noticed increasing bilateral lower extremity claudication which is lifestyle limiting. Dopplers have shown a decline in his ABIs bilaterally. He does have high frequency signal in his right greater than left iliac systems. I performed peripheral angiography on him 04/24/16 revealing a 75% distal abdominal aortic stenosis with a 60 mm pullback gradient. He also had a 95% calcified distal left common femoral artery stenosis and an 80% segmental mid right SFA stenosis with 2 vessel runoff bilaterally. I stented his distal abdominal aorta with a 10 x 29 mm long balloon expandable stent. His ABIs and increased from 0.7 bilaterally up to 1 on the right and 0.8 on the left. His claudication completely resolved. He was able to go on a trip to the [facility] and ambulated without  limitation. Since I saw me or go he's remained stable. He was admitted earlier this year for dizziness and vertigo and has worked with physical therapy since that time with improvement of symptoms. He also underwent an uncomplicated TURP 09/06/01 which did have some postop infection which resolved after that.   Current Meds  Medication Sig  . amLODipine (NORVASC) 5 MG tablet Take 5 mg by mouth daily.    [name] aspirin 81 MG tablet Take 81 mg by mouth daily.    [name] atenolol (TENORMIN) 50 MG tablet Take 50 mg by mouth daily.    [name] atorvastatin (LIPITOR) 40 MG tablet Take 40 mg by mouth daily at 6 PM.   . benzonatate (TESSALON) 100 MG capsule Take 100 mg by mouth 3 (three) times daily as needed for cough.   . Biotin 5000 MCG CAPS Take 5,000 mcg by mouth daily.  . Calcium Carbonate-Vitamin D (CALCIUM 600+D PO) Take 1 tablet by mouth daily.  . Carboxymethylcellul-Glycerin (LUBRICATING EYE DROPS OP) Apply 1 drop to eye daily as needed (dry eyes).  . Cholecalciferol (VITAMIN D3) 1000 units CAPS Take 1,000 Units by mouth daily.  . cilostazol (PLETAL) 100 MG tablet Take 100 mg by mouth daily.   . ciprofloxacin (CIPRO) 500 MG tablet Take 500 mg by mouth 2 (two) times daily.   . clopidogrel (PLAVIX) 75 MG tablet Take 1 tablet (75 mg total) by mouth at bedtime.  . diazepam (VALIUM) 5 MG tablet Take 5 mg by mouth every 8 (eight) hours as needed for anxiety.  . fish oil-omega-3 fatty acids  1000 MG capsule Take 2 g by mouth daily.    [name] GLUCOSAMINE PO Take 1,500 mg by mouth daily.   . hydrochlorothiazide 25 MG tablet Take 25 mg by mouth daily.    [name] losartan (COZAAR) 50 MG tablet Take 50 mg by mouth daily.  . Melatonin 5 MG CAPS Take 5 mg by mouth at bedtime as needed (sleep).  . Multiple Vitamin (MULTIVITAMIN PO) Take 1 tablet by mouth daily.   [name] omeprazole (PRILOSEC) 20 MG capsule   . pantoprazole (PROTONIX) 40 MG tablet Take 1 tablet (40 mg total) by mouth daily.  . potassium chloride SA (K-DUR,KLOR-CON) 20 MEQ  tablet Take 20 mEq by mouth daily.   . traMADol (ULTRAM) 50 MG tablet Take 1-2 tablets (50-100 mg total) by mouth every 6 (six) hours as needed for moderate pain.  . vitamin C (ASCORBIC ACID) 500 MG tablet Take 500 mg by mouth daily.     Allergies  Allergen Reactions  . Lisinopril Cough  . Niaspan [Niacin Er] Rash    Social History   Social History  . Marital status: Married    Spouse name: [name]  . Number of children: N/A  . Years of education: BA   Occupational History  . Retired    Social History Main Topics  . Smoking status: Former Smoker    Quit date: 07/31/1968  . Smokeless tobacco: Never Used  . Alcohol use 2.4 oz/week    4 Glasses of wine per week     Comment: OCASIONAL  . Drug use: No  . Sexual activity: Not on file   Other Topics Concern  . Not on file   Social History Narrative   Lives with wife   Caffeine use: Decaf coffee, tea   Left handed     Review of Systems: General: negative for chills, fever, night sweats or weight changes.  Cardiovascular: negative for chest pain, dyspnea on exertion, edema, orthopnea, palpitations, paroxysmal nocturnal dyspnea or shortness of breath Dermatological: negative for rash Respiratory: negative for cough or wheezing Urologic: negative for hematuria Abdominal: negative for nausea, vomiting, diarrhea, bright red blood per rectum, melena, or hematemesis Neurologic: negative for visual changes, syncope, or dizziness All other systems reviewed and are otherwise negative except as noted above.    Blood pressure (!) 157/64, pulse 63, height 5\' 5"  (1.651 m), weight 179 lb 6.4 oz (81.4 kg).  General appearance: alert and no distress Neck: no adenopathy, no JVD, supple, symmetrical, trachea midline, thyroid not enlarged, symmetric, no tenderness/mass/nodules and Soft bilateral carotid bruits Lungs: clear to auscultation bilaterally Heart: regular rate and rhythm, S1, S2 normal, no murmur, click, rub or gallop Extremities:  extremities normal, atraumatic, no cyanosis or edema Pulses: 2+ and symmetric Skin: Skin color, texture, turgor normal. No rashes or lesions Neurologic: Alert and oriented X 3, normal strength and tone. Normal symmetric reflexes. Normal coordination and gait  EKG sinus rhythm at 63 with nonspecific ST and T-wave changes. I personally reviewed this EKG.  ASSESSMENT AND PLAN:   HYPERCHOLESTEROLEMIA History of hyperlipidemia on statin therapy with recent lipid profile performed 11/23/16 revealed a total cholesterol 108, LDL 44 and HDL of 45.  Essential hypertension History of essential hypertension with blood pressure measured at 157/64. He is on amlodipine, atenolol, hydrochlorothiazide and losartan. Continue current meds at current dosing.  Coronary atherosclerosis History of coronary artery disease status post stenting of his proximal mid and distal RCA using overlapping stents as well as his proximal AV groove circumflex were noted  occluded OM branch which was old. Myoview stress test performed in 2010 which is nonischemic and has normal LV function by 2-D echocardiogram. He denies chest pain or shortness of breath.  Peripheral arterial disease (HCC) History of peripheral arterial disease status post distal abdominal aortic stenting by myself for lifestyle limiting claudication 04/24/16 the 10 mm x 29 mm long balloon expandable stent. He did have a 16 mm transstenotic gradient. In addition, he had 95% calcified left common femoral artery stenosis and 80% mid right SFA with two-vessel runoff bilaterally. His ABIs improved from 0.7 up to 1 and his claudications resolved at that time. He was able to go to the [facility] and walk without limitation. Subsequent Doppler showed mild decline in his ABIs although he continues to be asymptomatic.  Bilateral carotid artery disease (HCC) History of carotid artery disease status post remote right carotid endarterectomy with known moderate left ICA stenosis  which we have been following by Dubowitz ultrasound and has remained stable.      [name] FACP,FACC,FAHA, [facility] 05/23/2017 10:06 AM

## 2017-05-23 NOTE — Assessment & Plan Note (Signed)
History of hyperlipidemia on statin therapy with recent lipid profile performed 11/23/16 revealed a total cholesterol 108, LDL 44 and HDL of 45.

## 2017-05-23 NOTE — Assessment & Plan Note (Signed)
History of coronary artery disease status post stenting of his proximal mid and distal RCA using overlapping stents as well as his proximal AV groove circumflex were noted occluded OM branch which was old. Myoview stress test performed in 2010 which is nonischemic and has normal LV function by 2-D echocardiogram. He denies chest pain or shortness of breath.

## 2017-05-23 NOTE — Assessment & Plan Note (Signed)
History of peripheral arterial disease status post distal abdominal aortic stenting by myself for lifestyle limiting claudication 04/24/16 the 10 mm x 29 mm long balloon expandable stent. He did have a 16 mm transstenotic gradient. In addition, he had 95% calcified left common femoral artery stenosis and 80% mid right SFA with two-vessel runoff bilaterally. His ABIs improved from 0.7 up to 1 and his claudications resolved at that time. He was able to go to the [facility] and walk without limitation. Subsequent Doppler showed mild decline in his ABIs although he continues to be asymptomatic.

## 2017-05-23 NOTE — Assessment & Plan Note (Signed)
History of essential hypertension with blood pressure measured at 157/64. He is on amlodipine, atenolol, hydrochlorothiazide and losartan. Continue current meds at current dosing.

## 2017-05-23 NOTE — Patient Instructions (Signed)
Medication Instructions: Your physician recommends that you continue on your current medications as directed. Please refer to the Current Medication list given to you today.   Testing/Procedures: Your physician has requested that you have a aorta and iliac duplex. During this test, an ultrasound is used to evaluate blood flow to the aorta and iliac arteries. Allow one hour for this exam. Do not eat after midnight the day before and avoid carbonated beverages.   Your physician has requested that you have a carotid duplex. This test is an ultrasound of the carotid arteries in your neck. It looks at blood flow through these arteries that supply the brain with blood. Allow one hour for this exam. There are no restrictions or special instructions.  (Both due in April-May 2019)  Follow-Up: Your physician wants you to follow-up in: 1 year with Dr. Gwenlyn Found. You will receive a reminder letter in the mail two months in advance. If you don't receive a letter, please call our office to schedule the follow-up appointment.  If you need a refill on your cardiac medications before your next appointment, please call your pharmacy.

## 2017-05-23 NOTE — Assessment & Plan Note (Signed)
History of carotid artery disease status post remote right carotid endarterectomy with known moderate left ICA stenosis which we have been following by Dubowitz ultrasound and has remained stable.

## 2017-05-25 DIAGNOSIS — R310 Gross hematuria: Secondary | ICD-10-CM | POA: Diagnosis not present

## 2017-06-06 ENCOUNTER — Other Ambulatory Visit (HOSPITAL_COMMUNITY): Payer: Self-pay | Admitting: *Deleted

## 2017-06-12 ENCOUNTER — Other Ambulatory Visit (HOSPITAL_COMMUNITY): Payer: Self-pay | Admitting: *Deleted

## 2017-06-13 ENCOUNTER — Ambulatory Visit (INDEPENDENT_AMBULATORY_CARE_PROVIDER_SITE_OTHER): Payer: Medicare Other | Admitting: Adult Health

## 2017-06-13 ENCOUNTER — Encounter: Payer: Self-pay | Admitting: Adult Health

## 2017-06-13 VITALS — BP 161/61 | HR 72 | Wt 178.0 lb

## 2017-06-13 DIAGNOSIS — R269 Unspecified abnormalities of gait and mobility: Secondary | ICD-10-CM

## 2017-06-13 DIAGNOSIS — I6523 Occlusion and stenosis of bilateral carotid arteries: Secondary | ICD-10-CM | POA: Diagnosis not present

## 2017-06-13 DIAGNOSIS — R42 Dizziness and giddiness: Secondary | ICD-10-CM | POA: Diagnosis not present

## 2017-06-13 NOTE — Progress Notes (Signed)
I have read the note, and I agree with the clinical assessment and plan.  Nathan Santiago   

## 2017-06-13 NOTE — Progress Notes (Signed)
PATIENT: [facility] DOB: September 01, 1930  REASON FOR VISIT: follow up HISTORY FROM: patient  HISTORY OF PRESENT ILLNESS: Today 06/13/17 Nathan Santiago is an 83 year old male with a history of vertigo and difficulty with his balance.  He returns today for follow-up.  He reports that he did go to physical therapy.  He states that they help significantly with his balance.  He states that he has no longer having any issues.  He has not had an episode of vertigo since his last visit.He states typically if he begins to have dizziness if he takes Valium for 3 days this resolves his symptoms. he reports in the past his vertigo could last 2-3 weeks.  He returns today for an evaluation.  HISTORY 02/13/17: Nathan Santiago is an 83 year old left-handed white male with a history of vertigo that he claims his been present off and on since 1966. The patient indicates that his episodes of vertigo are more common in the spring and fall, he takes low-dose diazepam, 2.5 mg, and this will help his sensations of vertigo and help his balance. The patient went in to the hospital on 11/22/2016 with leg weakness, problems with walking, and low back pain. The patient was noted to have a urinary tract infection, he was placed on Cipro and his leg weakness improved within about 10 days. The patient has had ongoing problems with some gait instability that is worse than usual since April 2018, he will have good days and bad days with walking and when the gait is unstable he will have a tendency to veer to the left. He does have bilateral hearing aids, decreased hearing and ringing in the ears bilaterally. He has not had any change in hearing recently, he has not had any speech or swallowing problems or visual changes or double vision. The patient did undergo MRI evaluation of the brain in April 2018 and this showed a mild level small vessel disease. No acute changes were seen. No evidence of brainstem ischemia was seen. The  patient has undergone MRI of the low back as well that did not show any evidence of significant spinal stenosis. The patient reports no numbness of the extremities, he does have some neck and low back pain without radiation down into the extremities. He denies problems controlling the bowels or the bladder, he denies any memory problems. He has not had any blackout episodes. He reports no recent falls, he does not use a cane or a walker for ambulation. He is sent to this office for further evaluation. He has undergone vestibular rehabilitation in the past which has been helpful.  REVIEW OF SYSTEMS: Out of a complete 14 system review of symptoms, the patient complains only of the following symptoms, and all other reviewed systems are negative.  Se HPI  ALLERGIES: Allergies  Allergen Reactions  . Lisinopril Cough  . Niaspan [Niacin Er] Rash    HOME MEDICATIONS: Outpatient Medications Prior to Visit  Medication Sig Dispense Refill  . amLODipine (NORVASC) 5 MG tablet Take 5 mg by mouth daily.      [name] aspirin 81 MG tablet Take 81 mg by mouth daily.      [name] atenolol (TENORMIN) 50 MG tablet Take 50 mg by mouth daily.      [name] atorvastatin (LIPITOR) 40 MG tablet Take 40 mg by mouth daily at 6 PM.     . benzonatate (TESSALON) 100 MG capsule Take 100 mg by mouth 3 (three) times daily as needed for cough.  0  . Biotin 5000 MCG CAPS Take 5,000 mcg by mouth daily.    . Calcium Carbonate-Vitamin D (CALCIUM 600+D PO) Take 1 tablet by mouth daily.    . Carboxymethylcellul-Glycerin (LUBRICATING EYE DROPS OP) Apply 1 drop to eye daily as needed (dry eyes).    . Cholecalciferol (VITAMIN D3) 1000 units CAPS Take 1,000 Units by mouth daily.    . cilostazol (PLETAL) 100 MG tablet Take 100 mg by mouth daily.     . ciprofloxacin (CIPRO) 500 MG tablet Take 500 mg by mouth 2 (two) times daily.     . diazepam (VALIUM) 5 MG tablet Take 5 mg by mouth every 8 (eight) hours as needed for anxiety.    . fish oil-omega-3  fatty acids 1000 MG capsule Take 2 g by mouth daily.      [name] GLUCOSAMINE PO Take 1,500 mg by mouth daily.     . hydrochlorothiazide 25 MG tablet Take 25 mg by mouth daily.      [name] losartan (COZAAR) 50 MG tablet Take 50 mg by mouth daily.    . Melatonin 5 MG CAPS Take 5 mg by mouth at bedtime as needed (sleep).    . Multiple Vitamin (MULTIVITAMIN PO) Take 1 tablet by mouth daily.     [name] omeprazole (PRILOSEC) 20 MG capsule     . pantoprazole (PROTONIX) 40 MG tablet Take 1 tablet (40 mg total) by mouth daily. 30 tablet 12  . potassium chloride SA (K-DUR,KLOR-CON) 20 MEQ tablet Take 20 mEq by mouth daily.     . traMADol (ULTRAM) 50 MG tablet Take 1-2 tablets (50-100 mg total) by mouth every 6 (six) hours as needed for moderate pain. 15 tablet 0  . vitamin C (ASCORBIC ACID) 500 MG tablet Take 500 mg by mouth daily.    . clopidogrel (PLAVIX) 75 MG tablet Take 1 tablet (75 mg total) by mouth at bedtime. (Patient not taking: Reported on 06/13/2017)     No facility-administered medications prior to visit.     PAST MEDICAL HISTORY: Past Medical History:  Diagnosis Date  . AAA (abdominal aortic aneurysm) (HCC)    asymptomatic  . AAA (abdominal aortic aneurysm) ([place]) 2010   peripheral  angiogram-- bilateral SFA DISEASE  and 60 to 70% infrarenaal abd. aortic stenosis with 15 -mm gradient  . Adenomatous colon polyp 11/2003  . CAD (coronary artery disease)   . Gait abnormality 02/13/2017  . Gastroparesis   . GERD (gastroesophageal reflux disease)    w/ LPR  . Hyperlipidemia   . Hypertension   . Peripheral arterial disease ([place])    RCEA  by Dr Lemar Livings  . PUD (peptic ulcer disease)   . Vertigo     PAST SURGICAL HISTORY: Past Surgical History:  Procedure Laterality Date  . AORTOGRAM  04/24/2016    Abdominal aortogram, bilateral iliac angiogram, bifemoral runoff  . CARDIAC CATHETERIZATION  05/12/2005   RCA  . carotid doppler  01/24/2013   RICA endarterectomy,left CCA 0-49%; left bulb and  prox ICA 50-69%; bilaateral subclavian < 50%  . CAROTID ENDARTERECTOMY  11/01/2010  . CATARACT EXTRACTION    . CORONARY ANGIOPLASTY  05/18/2005   2 STENTS distal RCA AND PROXIMAL-MID RCA  . DOPPLER ECHOCARDIOGRAPHY  02/07/2012   EF 55%,SHOWED NO ISCHEMIA   . lower arterial  doppler  02/04/2013   aotra 1.5 x 1.5 cm; distal abd aorta 70-99%,proximal common iliac arteries -very stenotic with increased velocities>50%,may be falsely elevated as a result of residual plaque  from the distal aorta stenosis  . lower extremity doppler  June 18 ,2013   ABI'S ABNORMAL, RABI was 0.88 and LABI 0.75 ,with 3-vessel  run off  . NM MYOVIEW LTD  MAY [id number]   showed no significant ischemia;  . NM MYOVIEW LTD  04/22/2008   ef 77%,exercise capcity 6 METS ,exaggerated blood pressure response to exercise  . retrograde central aortic catheterization  05/19/2005   cutting balloon atherectomy, c-circ stenosis with DES STENTING CYPHER  . TONSILLECTOMY      FAMILY HISTORY: Family History  Problem Relation Age of Onset  . Ovarian cancer Sister   . Heart disease Father   . Brain cancer Brother        Tumor    SOCIAL HISTORY: Social History   Socioeconomic History  . Marital status: Married    Spouse name: [name]  . Number of children: Not on file  . Years of education: BA  . Highest education level: Not on file  Social Needs  . Financial resource strain: Not on file  . Food insecurity - worry: Not on file  . Food insecurity - inability: Not on file  . Transportation needs - medical: Not on file  . Transportation needs - non-medical: Not on file  Occupational History  . Occupation: Retired  Tobacco Use  . Smoking status: Former Smoker    Last attempt to quit: 07/31/1968    Years since quitting: 48.9  . Smokeless tobacco: Never Used  Substance and Sexual Activity  . Alcohol use: Yes    Alcohol/week: 2.4 oz    Types: 4 Glasses of wine per week    Comment: OCASIONAL  . Drug use: No  . Sexual activity:  Not on file  Other Topics Concern  . Not on file  Social History Narrative   Lives with wife   Caffeine use: Decaf coffee, tea   Left handed      PHYSICAL EXAM  Vitals:   06/13/17 0814  BP: (!) 161/61  Pulse: 72  Weight: 178 lb (80.7 kg)   Body mass index is 29.62 kg/m.  Generalized: Well developed, in no acute distress   Neurological examination  Mentation: Alert oriented to time, place, history taking. Follows all commands speech and language fluent Cranial nerve II-XII: Pupils were equal round reactive to light. Extraocular movements were full, visual field were full on confrontational test. Facial sensation and strength were normal. Uvula tongue midline. Head turning and shoulder shrug  were normal and symmetric. Motor: The motor testing reveals 5 over 5 strength of all 4 extremities. Good symmetric motor tone is noted throughout.  Sensory: Sensory testing is intact to soft touch on all 4 extremities. No evidence of extinction is noted.  Coordination: Cerebellar testing reveals good finger-nose-finger and heel-to-shin bilaterally.  Gait and station: Gait is normal. Tandem gait is normal. Romberg is negative. No drift is seen.  Reflexes: Deep tendon reflexes are symmetric and normal bilaterally.   DIAGNOSTIC DATA (LABS, IMAGING, TESTING) - I reviewed patient records, labs, notes, testing and imaging myself where available.  Lab Results  Component Value Date   WBC 10.1 11/24/2016   HGB 11.3 (L) 11/24/2016   HCT 34.0 (L) 11/24/2016   MCV 85.9 11/24/2016   PLT 159 11/24/2016      Component Value Date/Time   NA 140 11/24/2016 0552   K 3.1 (L) 11/24/2016 0552   CL 105 11/24/2016 0552   CO2 27 11/24/2016 0552   GLUCOSE 118 (H) 11/24/2016 8466  BUN 20 11/24/2016 0552   CREATININE 0.81 11/24/2016 0552   CREATININE 0.79 04/12/2016 1353   CALCIUM 8.7 (L) 11/24/2016 0552   PROT 7.6 11/22/2016 1814   ALBUMIN 3.7 11/22/2016 1814   AST 22 11/22/2016 1814   ALT 24  11/22/2016 1814   ALKPHOS 72 11/22/2016 1814   BILITOT 0.6 11/22/2016 1814   GFRNONAA >60 11/24/2016 0552   GFRAA >60 11/24/2016 0552   Lab Results  Component Value Date   CHOL 108 11/23/2016   HDL 45 11/23/2016   LDLCALC 44 11/23/2016   TRIG 94 11/23/2016   CHOLHDL 2.4 11/23/2016   Lab Results  Component Value Date   HGBA1C 5.5 11/23/2016   Lab Results  Component Value Date   DGUYQIHK74 259 02/13/2017   Lab Results  Component Value Date   TSH 1.95 04/12/2016      ASSESSMENT AND PLAN 83 y.o. year old male  has a past medical history of AAA (abdominal aortic aneurysm) ([place]), AAA (abdominal aortic aneurysm) ([place]) (2010), Adenomatous colon polyp (11/2003), CAD (coronary artery disease), Gait abnormality (02/13/2017), Gastroparesis, GERD (gastroesophageal reflux disease), Hyperlipidemia, Hypertension, Peripheral arterial disease ([place]), PUD (peptic ulcer disease), and Vertigo. here with:  1.  Vertigo 2.  Gait abnormality  Overall the patient is doing well.  He reports good benefit from physical therapy.  His physical exam is relatively unremarkable.  He will continue to use Valium as needed for vertigo.  He is advised if his symptoms worsen or he develops new symptoms he should let us know.  Otherwise he will follow-up on an as-needed basis.    Ward Givens, MSN, NP-C 06/13/2017, 8:40 AM [facility] [address] [place], [place] [address] [phone]

## 2017-06-13 NOTE — Patient Instructions (Signed)
Your Plan:  Continue using Valium as needed for vertigo If balance gets worse or you begin to have falls let us know.  If your symptoms worsen or you develop new symptoms please let us know.   Thank you for coming to see [place] at [facility]. I hope we have been able to provide you high quality care today.  You may receive a patient satisfaction survey over the next few weeks. We would appreciate your feedback and comments so that we may continue to improve ourselves and the health of our patients.'

## 2017-06-14 ENCOUNTER — Other Ambulatory Visit (HOSPITAL_COMMUNITY): Payer: Self-pay | Admitting: *Deleted

## 2017-06-19 ENCOUNTER — Other Ambulatory Visit: Payer: Self-pay | Admitting: *Deleted

## 2017-06-19 DIAGNOSIS — R310 Gross hematuria: Secondary | ICD-10-CM | POA: Diagnosis not present

## 2017-06-19 MED ORDER — PANTOPRAZOLE SODIUM 40 MG PO TBEC: 40.0000 mg | DELAYED_RELEASE_TABLET | Freq: Every day | ORAL | 12 refills | Status: DC

## 2017-06-20 ENCOUNTER — Other Ambulatory Visit: Payer: Self-pay

## 2017-07-17 DIAGNOSIS — M25552 Pain in left hip: Secondary | ICD-10-CM | POA: Diagnosis not present

## 2017-08-02 DIAGNOSIS — C61 Malignant neoplasm of prostate: Secondary | ICD-10-CM | POA: Diagnosis not present

## 2017-08-02 DIAGNOSIS — R310 Gross hematuria: Secondary | ICD-10-CM | POA: Diagnosis not present

## 2017-08-29 DIAGNOSIS — Z961 Presence of intraocular lens: Secondary | ICD-10-CM | POA: Diagnosis not present

## 2017-08-29 DIAGNOSIS — H3500 Unspecified background retinopathy: Secondary | ICD-10-CM | POA: Diagnosis not present

## 2017-08-29 DIAGNOSIS — H52203 Unspecified astigmatism, bilateral: Secondary | ICD-10-CM | POA: Diagnosis not present

## 2017-09-10 ENCOUNTER — Encounter (INDEPENDENT_AMBULATORY_CARE_PROVIDER_SITE_OTHER): Payer: Self-pay

## 2017-09-10 ENCOUNTER — Encounter: Payer: Self-pay | Admitting: Physician Assistant

## 2017-09-10 ENCOUNTER — Ambulatory Visit (INDEPENDENT_AMBULATORY_CARE_PROVIDER_SITE_OTHER): Payer: Medicare Other | Admitting: Physician Assistant

## 2017-09-10 VITALS — BP 122/78 | HR 74 | Ht 64.5 in | Wt 180.0 lb

## 2017-09-10 DIAGNOSIS — L290 Pruritus ani: Secondary | ICD-10-CM | POA: Diagnosis not present

## 2017-09-10 NOTE — Progress Notes (Signed)
Subjective:    Patient ID: Nathan Santiago, male    DOB: 24-Jul-1931, 84 y.o.   MRN: [id number]  HPI Nathan Santiago is a pleasant 84 year old white male, known to Dr. Fuller Plan who was last seen in 2014. He comes in today with complaints of chronic perianal and anal itching over the past year. He says his symptoms started last spring after he had undergone a TURP in February, he developed a bladder infection, was treated with antibiotics, then developed the perianal irritation. He was seen by his dermatologist or other issues and mentioned the irritation and was given a prescription for ketoconazole he has been using regularly since the summer of 2018. He says this has helped somewhat but his symptoms will go away and he is persistently bothered by intense burning and itching that will wake him up in the middle of the night. He does okay during the daytime hours generally has been cleaning himself with just water and a washcloth then applying the ketoconazole twice daily. He denies any problems with anal pain or bleeding, no changes in bowel habits, bowel movements have been normal no fecal soilage. Last colonoscopy August 2010 with 2 small polyps removed which were hyperplastic, and no internal hemorrhoids noted.  Review of Systems Pertinent positive and negative review of systems were noted in the above HPI section.  All other review of systems was otherwise negative.  Outpatient Encounter Medications as of 09/10/2017  Medication Sig  . amLODipine (NORVASC) 5 MG tablet Take 5 mg by mouth daily.    [name] aspirin 81 MG tablet Take 81 mg by mouth daily.    [name] atenolol (TENORMIN) 50 MG tablet Take 50 mg by mouth daily.    [name] atorvastatin (LIPITOR) 40 MG tablet Take 40 mg by mouth daily at 6 PM.   . benzonatate (TESSALON) 100 MG capsule Take 100 mg by mouth 3 (three) times daily as needed for cough.   . Biotin 5000 MCG CAPS Take 5,000 mcg by mouth daily.  . Calcium Carbonate-Vitamin D (CALCIUM 600+D PO) Take 1 tablet  by mouth daily.  . Carboxymethylcellul-Glycerin (LUBRICATING EYE DROPS OP) Apply 1 drop to eye daily as needed (dry eyes).  . Cholecalciferol (VITAMIN D3) 1000 units CAPS Take 1,000 Units by mouth daily.  . cilostazol (PLETAL) 100 MG tablet Take 100 mg by mouth daily.   . ciprofloxacin (CIPRO) 500 MG tablet Take 500 mg by mouth 2 (two) times daily.   . clopidogrel (PLAVIX) 75 MG tablet Take 1 tablet (75 mg total) by mouth at bedtime. (Patient not taking: Reported on 06/13/2017)  . diazepam (VALIUM) 5 MG tablet Take 5 mg by mouth every 8 (eight) hours as needed for anxiety.  . fish oil-omega-3 fatty acids 1000 MG capsule Take 2 g by mouth daily.    [name] GLUCOSAMINE PO Take 1,500 mg by mouth daily.   . hydrochlorothiazide 25 MG tablet Take 25 mg by mouth daily.    [name] losartan (COZAAR) 50 MG tablet Take 50 mg by mouth daily.  . Melatonin 5 MG CAPS Take 5 mg by mouth at bedtime as needed (sleep).  . Multiple Vitamin (MULTIVITAMIN PO) Take 1 tablet by mouth daily.   . pantoprazole (PROTONIX) 40 MG tablet Take 1 tablet (40 mg total) by mouth daily.  . potassium chloride SA (K-DUR,KLOR-CON) 20 MEQ tablet Take 20 mEq by mouth daily.   . vitamin C (ASCORBIC ACID) 500 MG tablet Take 500 mg by mouth daily.  . [DISCONTINUED] omeprazole (PRILOSEC) 20  MG capsule   . [DISCONTINUED] traMADol (ULTRAM) 50 MG tablet Take 1-2 tablets (50-100 mg total) by mouth every 6 (six) hours as needed for moderate pain. (Patient not taking: Reported on 09/10/2017)   No facility-administered encounter medications on file as of 09/10/2017.    Allergies  Allergen Reactions  . Lisinopril Cough  . Niaspan [Niacin Er] Rash   Patient Active Problem List   Diagnosis Date Noted  . Gait abnormality 02/13/2017  . UTI (urinary tract infection) 11/22/2016  . Vertigo 11/22/2016  . Hypokalemia 11/22/2016  . Urinary frequency 09/08/2016  . Claudication ([place]) 04/24/2016  . Bilateral carotid artery disease ([place]) 08/07/2014  . Peripheral  arterial disease ([place]) 08/06/2013  . GASTROPARESIS 02/25/2009  . COLONIC POLYPS, ADENOMATOUS, HX OF 02/22/2009  . HYPERCHOLESTEROLEMIA 09/26/2007  . Essential hypertension 09/26/2007  . Coronary atherosclerosis 09/26/2007  . ALLERGIC RHINITIS 09/26/2007  . GERD 09/26/2007  . Peptic ulcer 09/26/2007  . UMBILICAL HERNIA 09/38/1829  . NEPHROLITHIASIS 09/26/2007  . BLADDER STONE 09/26/2007   Social History   Socioeconomic History  . Marital status: Married    Spouse name: [name]  . Number of children: Not on file  . Years of education: BA  . Highest education level: Not on file  Social Needs  . Financial resource strain: Not on file  . Food insecurity - worry: Not on file  . Food insecurity - inability: Not on file  . Transportation needs - medical: Not on file  . Transportation needs - non-medical: Not on file  Occupational History  . Occupation: Retired  Tobacco Use  . Smoking status: Former Smoker    Last attempt to quit: 07/31/1968    Years since quitting: 49.1  . Smokeless tobacco: Never Used  Substance and Sexual Activity  . Alcohol use: Yes    Alcohol/week: 2.4 oz    Types: 4 Glasses of wine per week    Comment: OCASIONAL  . Drug use: No  . Sexual activity: Not on file  Other Topics Concern  . Not on file  Social History Narrative   Lives with wife   Caffeine use: Decaf coffee, tea   Left handed    Mr. Whistler family history includes Brain cancer in his brother; Heart disease in his father; Ovarian cancer in his sister.      Objective:    Vitals:   09/10/17 1429  BP: 122/78  Pulse: 74    Physical Exam well-developed elderly white male in no acute distress, pleasant blood pressure 122/78 pulse 74, height 5 foot 4, weight 180, BMI of 30.4. HEENT; nontraumatic, cephalic EOMI PERRLA sclera anicteric, Cardiovascular ;regular rate and rhythm with S1-S2, Abdomen; soft nontender nondistended bowel sounds are active no palpable mass or hepatosplenomegaly,  Rectal ;exam-external only, patient has mild chronic pinkish erythema around the perianal area, no excoriations, no crusting or lesions erythema is confluent not patchy. Skin somewhat atrophic appearing       Assessment & Plan:   #34 84 year old white male with pruritus ani #2 chronic GERD #3 coronary artery disease #4 peripheral vascular disease  Plan; we discussed rectal care with moistened wipes or warm washcloth, avoiding harsh soaps. Suggest  he use Balneol lotion for cleansing After cleansing apply Balmex or zinc oxide cream, which will be repeated at bedtime We'll also have him try recticare 5% lidocaine on a when necessary basis if he is awakened at night with itching. He is to stop ketoconazole. Patient will call back in 4-6 weeks of symptoms have not  significantly improved.  Kiing Deakin Genia Harold PA-C 09/10/2017   Cc: [name], MD

## 2017-09-10 NOTE — Progress Notes (Signed)
Reviewed and agree with initial management plan.  Mathan Darroch T. Kayvion Arneson, MD FACG 

## 2017-09-10 NOTE — Patient Instructions (Addendum)
After a bowel movement he can clean his rectal area with Balneol lotion.( over the counter), After you do this, You can also get Zinc Oxide cream/paste ( over the counter).  Or Balmex cream. Use this after you use the Balneol lotion.   Repeat Balmex at bedtime.  You can also get Recticare with 5 % lidocaine, use at bedtime for night time itching.  Call us back in 3 weeks if not much better.Ask for Amy's nurse [place].   If you are age 82 or older, your body mass index should be between 23-30. Your Body mass index is 30.42 kg/m. If this is out of the aforementioned range listed, please consider follow up with your Primary Care Provider.

## 2017-09-11 DIAGNOSIS — E7849 Other hyperlipidemia: Secondary | ICD-10-CM | POA: Diagnosis not present

## 2017-09-11 DIAGNOSIS — I10 Essential (primary) hypertension: Secondary | ICD-10-CM | POA: Diagnosis not present

## 2017-09-11 DIAGNOSIS — R82998 Other abnormal findings in urine: Secondary | ICD-10-CM | POA: Diagnosis not present

## 2017-09-11 DIAGNOSIS — R7309 Other abnormal glucose: Secondary | ICD-10-CM | POA: Diagnosis not present

## 2017-09-11 DIAGNOSIS — Z125 Encounter for screening for malignant neoplasm of prostate: Secondary | ICD-10-CM | POA: Diagnosis not present

## 2017-09-18 DIAGNOSIS — I701 Atherosclerosis of renal artery: Secondary | ICD-10-CM | POA: Diagnosis not present

## 2017-09-18 DIAGNOSIS — R42 Dizziness and giddiness: Secondary | ICD-10-CM | POA: Diagnosis not present

## 2017-09-18 DIAGNOSIS — Z0000 Encounter for general adult medical examination without abnormal findings: Secondary | ICD-10-CM | POA: Diagnosis not present

## 2017-09-18 DIAGNOSIS — C61 Malignant neoplasm of prostate: Secondary | ICD-10-CM | POA: Diagnosis not present

## 2017-09-18 DIAGNOSIS — Z1389 Encounter for screening for other disorder: Secondary | ICD-10-CM | POA: Diagnosis not present

## 2017-09-18 DIAGNOSIS — I714 Abdominal aortic aneurysm, without rupture: Secondary | ICD-10-CM | POA: Diagnosis not present

## 2017-09-18 DIAGNOSIS — E7849 Other hyperlipidemia: Secondary | ICD-10-CM | POA: Diagnosis not present

## 2017-09-18 DIAGNOSIS — R269 Unspecified abnormalities of gait and mobility: Secondary | ICD-10-CM | POA: Diagnosis not present

## 2017-09-18 DIAGNOSIS — Z6831 Body mass index (BMI) 31.0-31.9, adult: Secondary | ICD-10-CM | POA: Diagnosis not present

## 2017-09-18 DIAGNOSIS — I739 Peripheral vascular disease, unspecified: Secondary | ICD-10-CM | POA: Diagnosis not present

## 2017-09-18 DIAGNOSIS — K635 Polyp of colon: Secondary | ICD-10-CM | POA: Diagnosis not present

## 2017-09-18 DIAGNOSIS — R7309 Other abnormal glucose: Secondary | ICD-10-CM | POA: Diagnosis not present

## 2017-09-20 DIAGNOSIS — Z1212 Encounter for screening for malignant neoplasm of rectum: Secondary | ICD-10-CM | POA: Diagnosis not present

## 2017-09-25 DIAGNOSIS — R310 Gross hematuria: Secondary | ICD-10-CM | POA: Diagnosis not present

## 2017-09-25 DIAGNOSIS — C61 Malignant neoplasm of prostate: Secondary | ICD-10-CM | POA: Diagnosis not present

## 2017-09-27 ENCOUNTER — Telehealth: Payer: Self-pay | Admitting: Cardiovascular Disease

## 2017-09-27 NOTE — Telephone Encounter (Signed)
° °  [facility] Pre-operative Risk Assessment    Request for surgical clearance:  What type of surgery is being performed?  TURP 1. When is this surgery scheduled? unscheduled  What type of clearance is required (medical clearance vs. Pharmacy clearance to hold med vs. Both)?  Cardiac both Are there any medications that need to be held prior to surgery and how long? plavix and asprin  5 days 2. Practice name and name of physician performing surgery? [facility]   3. What is your office phone and fax number?  [phone]  And [phone]  4. Anesthesia type (None, local, MAC, general) ? general   Lenon Curt 09/27/2017, 3:51 PM  _________________________________________________________________   (provider comments below)

## 2017-09-27 NOTE — Telephone Encounter (Signed)
   Primary Cardiologist: [name], MD  Chart reviewed as part of pre-operative protocol coverage.  Left voice mail to call back.  I will route this conversation to Dr. Gwenlyn Found to review antiplatelet therapy in time.   Pease forward your response to P CV DIV PREOP.   [place], [place] 09/27/2017, 4:18 PM

## 2017-09-28 NOTE — Telephone Encounter (Signed)
Follow Up: ° ° ° °Returning a call from yesterday. °

## 2017-09-28 NOTE — Telephone Encounter (Signed)
Follow up    Patient is calling back in reference to his pre-op clearance. Please call to discuss.

## 2017-09-28 NOTE — Telephone Encounter (Signed)
   Primary Cardiologist: [name], MD  Chart reviewed as part of pre-operative protocol coverage. Patient was contacted 09/28/2017 in reference to pre-operative risk assessment for pending surgery as outlined below.  Nathan Santiago was last seen on 05/23/2017 by Dr. Gwenlyn Found.  Since that day, Nathan Santiago has done well without exertional chest pain or SOB.  Therefore, based on ACC/AHA guidelines, the patient would be at acceptable risk for the planned procedure without further cardiovascular testing.   I will route this recommendation to the requesting party via Epic fax function and remove from pre-op pool.  Please call with questions. Cleared with Dr. Gwenlyn Found, he may stop aspirin and plavix for 5 days prior to the procedure and restart the medications as soon as possible after the procedure at the discretion of his surgeon.    Nathan Santiago, [place] 09/28/2017, 4:35 PM

## 2017-09-28 NOTE — Telephone Encounter (Signed)
Okay to interrupt antiplatelet therapy for surgery

## 2017-10-03 ENCOUNTER — Other Ambulatory Visit: Payer: Self-pay | Admitting: Urology

## 2017-10-08 NOTE — Patient Instructions (Addendum)
Nathan Santiago [facility]  10/08/2017   Your procedure is scheduled on: 10-10-17  Report to [facility] Main  Entrance              Report to admitting at        [place] AM    Call this number if you have problems the morning of surgery [phone]   Remember: Do not eat food or drink liquids :After Midnight.     Take these medicines the morning of surgery with A SIP OF WATER: protonix, atenelol, amlodipine                                You may not have any metal on your body including hair pins and              piercings  Do not wear jewelry, , lotions, powders or perfumes, deodorant     y.              Men may shave face and neck.   Do not bring valuables to the hospital. [place].  Contacts, dentures or bridgework may not be worn into surgery.  Leave suitcase in the car. After surgery it may be brought to your room.               Please read over the following fact sheets you were given: _____________________________________________________________________           [facility] - Preparing for Surgery Before surgery, you can play an important role.  Because skin is not sterile, your skin needs to be as free of germs as possible.  You can reduce the number of germs on your skin by washing with CHG (chlorahexidine gluconate) soap before surgery.  CHG is an antiseptic cleaner which kills germs and bonds with the skin to continue killing germs even after washing. Please DO NOT use if you have an allergy to CHG or antibacterial soaps.  If your skin becomes reddened/irritated stop using the CHG and inform your nurse when you arrive at Short Stay. Do not shave (including legs and underarms) for at least 48 hours prior to the first CHG shower.  You may shave your face/neck. Please follow these instructions carefully:  1.  Shower with CHG Soap the night before surgery and the  morning of Surgery.  2.  If you choose to wash  your hair, wash your hair first as usual with your  normal  shampoo.  3.  After you shampoo, rinse your hair and body thoroughly to remove the  shampoo.                           4.  Use CHG as you would any other liquid soap.  You can apply chg directly  to the skin and wash                       Gently with a scrungie or clean washcloth.  5.  Apply the CHG Soap to your body ONLY FROM THE NECK DOWN.   Do not use on face/ open  Wound or open sores. Avoid contact with eyes, ears mouth and genitals (private parts).                       Wash face,  Genitals (private parts) with your normal soap.             6.  Wash thoroughly, paying special attention to the area where your surgery  will be performed.  7.  Thoroughly rinse your body with warm water from the neck down.  8.  DO NOT shower/wash with your normal soap after using and rinsing off  the CHG Soap.                9.  Pat yourself dry with a clean towel.            10.  Wear clean pajamas.            11.  Place clean sheets on your bed the night of your first shower and do not  sleep with pets. Day of Surgery : Do not apply any lotions/deodorants the morning of surgery.  Please wear clean clothes to the hospital/surgery center.  FAILURE TO FOLLOW THESE INSTRUCTIONS MAY RESULT IN THE CANCELLATION OF YOUR SURGERY PATIENT SIGNATURE_________________________________  NURSE SIGNATURE__________________________________  ________________________________________________________________________

## 2017-10-08 NOTE — Progress Notes (Addendum)
Clearance 09-27-17 telephone note Dr. Gwenlyn Found epic  ekg 05-23-17 epic

## 2017-10-09 ENCOUNTER — Encounter (HOSPITAL_COMMUNITY): Payer: Self-pay

## 2017-10-09 ENCOUNTER — Encounter (HOSPITAL_COMMUNITY)
Admission: RE | Admit: 2017-10-09 | Discharge: 2017-10-09 | Disposition: A | Discharge status: Home / Self Care | Payer: Medicare Other | Source: Ambulatory Visit | Attending: Urology | Admitting: Urology

## 2017-10-09 ENCOUNTER — Other Ambulatory Visit: Payer: Self-pay

## 2017-10-09 DIAGNOSIS — R310 Gross hematuria: Secondary | ICD-10-CM | POA: Diagnosis not present

## 2017-10-09 DIAGNOSIS — Z87891 Personal history of nicotine dependence: Secondary | ICD-10-CM | POA: Diagnosis not present

## 2017-10-09 DIAGNOSIS — Z79899 Other long term (current) drug therapy: Secondary | ICD-10-CM | POA: Diagnosis not present

## 2017-10-09 DIAGNOSIS — Z7982 Long term (current) use of aspirin: Secondary | ICD-10-CM | POA: Diagnosis not present

## 2017-10-09 DIAGNOSIS — I739 Peripheral vascular disease, unspecified: Secondary | ICD-10-CM | POA: Diagnosis not present

## 2017-10-09 DIAGNOSIS — I2510 Atherosclerotic heart disease of native coronary artery without angina pectoris: Secondary | ICD-10-CM | POA: Diagnosis not present

## 2017-10-09 DIAGNOSIS — N411 Chronic prostatitis: Secondary | ICD-10-CM | POA: Diagnosis not present

## 2017-10-09 DIAGNOSIS — Z7902 Long term (current) use of antithrombotics/antiplatelets: Secondary | ICD-10-CM | POA: Diagnosis not present

## 2017-10-09 DIAGNOSIS — K219 Gastro-esophageal reflux disease without esophagitis: Secondary | ICD-10-CM | POA: Diagnosis not present

## 2017-10-09 DIAGNOSIS — E7800 Pure hypercholesterolemia, unspecified: Secondary | ICD-10-CM | POA: Diagnosis not present

## 2017-10-09 DIAGNOSIS — I10 Essential (primary) hypertension: Secondary | ICD-10-CM | POA: Diagnosis not present

## 2017-10-09 DIAGNOSIS — N400 Enlarged prostate without lower urinary tract symptoms: Secondary | ICD-10-CM | POA: Diagnosis not present

## 2017-10-09 HISTORY — DX: Personal history of urinary calculi: Z87.442

## 2017-10-09 LAB — BASIC METABOLIC PANEL
ANION GAP: 9 (ref 5–15)
BUN: 20 mg/dL (ref 6–20)
CALCIUM: 9.5 mg/dL (ref 8.9–10.3)
CO2: 26 mmol/L (ref 22–32)
Chloride: 107 mmol/L (ref 101–111)
Creatinine, Ser: 0.82 mg/dL (ref 0.61–1.24)
GFR calc non Af Amer: >60 mL/min (ref >60)
Glucose, Bld: 141 mg/dL — ABNORMAL HIGH (ref 65–99)
Potassium: 4.4 mmol/L (ref 3.5–5.1)
SODIUM: 142 mmol/L (ref 135–145)

## 2017-10-09 LAB — CBC
HCT: 38.6 % — ABNORMAL LOW (ref 39.0–52.0)
HEMOGLOBIN: 12.7 g/dL — AB (ref 13.0–17.0)
MCH: 29.3 pg (ref 26.0–34.0)
MCHC: 32.9 g/dL (ref 30.0–36.0)
MCV: 89.1 fL (ref 78.0–100.0)
PLATELETS: 226 10*3/uL (ref 150–400)
RBC: 4.33 MIL/uL (ref 4.22–5.81)
RDW: 12.4 % (ref 11.5–15.5)
WBC: 6.7 10*3/uL (ref 4.0–10.5)

## 2017-10-10 ENCOUNTER — Ambulatory Visit (HOSPITAL_COMMUNITY): Payer: Medicare Other | Admitting: Anesthesiology

## 2017-10-10 ENCOUNTER — Observation Stay (HOSPITAL_COMMUNITY)
Admission: RE | Admit: 2017-10-10 | Discharge: 2017-10-11 | Disposition: A | Discharge status: Home / Self Care | Payer: Medicare Other | Source: Ambulatory Visit | Attending: Urology | Admitting: Urology

## 2017-10-10 ENCOUNTER — Encounter (HOSPITAL_COMMUNITY)
Admission: RE | Disposition: A | Discharge status: Home / Self Care | Payer: Self-pay | Source: Ambulatory Visit | Attending: Urology

## 2017-10-10 ENCOUNTER — Encounter (HOSPITAL_COMMUNITY): Payer: Self-pay

## 2017-10-10 DIAGNOSIS — E7800 Pure hypercholesterolemia, unspecified: Secondary | ICD-10-CM | POA: Diagnosis not present

## 2017-10-10 DIAGNOSIS — N400 Enlarged prostate without lower urinary tract symptoms: Secondary | ICD-10-CM | POA: Diagnosis not present

## 2017-10-10 DIAGNOSIS — Z7902 Long term (current) use of antithrombotics/antiplatelets: Secondary | ICD-10-CM | POA: Diagnosis not present

## 2017-10-10 DIAGNOSIS — N411 Chronic prostatitis: Secondary | ICD-10-CM | POA: Diagnosis not present

## 2017-10-10 DIAGNOSIS — Z7982 Long term (current) use of aspirin: Secondary | ICD-10-CM | POA: Insufficient documentation

## 2017-10-10 DIAGNOSIS — Z79899 Other long term (current) drug therapy: Secondary | ICD-10-CM | POA: Diagnosis not present

## 2017-10-10 DIAGNOSIS — K219 Gastro-esophageal reflux disease without esophagitis: Secondary | ICD-10-CM | POA: Diagnosis not present

## 2017-10-10 DIAGNOSIS — I2510 Atherosclerotic heart disease of native coronary artery without angina pectoris: Secondary | ICD-10-CM | POA: Insufficient documentation

## 2017-10-10 DIAGNOSIS — I10 Essential (primary) hypertension: Secondary | ICD-10-CM | POA: Diagnosis not present

## 2017-10-10 DIAGNOSIS — Z8546 Personal history of malignant neoplasm of prostate: Secondary | ICD-10-CM | POA: Diagnosis not present

## 2017-10-10 DIAGNOSIS — N390 Urinary tract infection, site not specified: Secondary | ICD-10-CM | POA: Diagnosis not present

## 2017-10-10 DIAGNOSIS — R319 Hematuria, unspecified: Secondary | ICD-10-CM | POA: Diagnosis present

## 2017-10-10 DIAGNOSIS — I739 Peripheral vascular disease, unspecified: Secondary | ICD-10-CM | POA: Diagnosis not present

## 2017-10-10 DIAGNOSIS — Z87891 Personal history of nicotine dependence: Secondary | ICD-10-CM | POA: Diagnosis not present

## 2017-10-10 DIAGNOSIS — R310 Gross hematuria: Principal | ICD-10-CM | POA: Insufficient documentation

## 2017-10-10 HISTORY — PX: TRANSURETHRAL RESECTION OF PROSTATE: SHX73

## 2017-10-10 SURGERY — TURP (TRANSURETHRAL RESECTION OF PROSTATE)
Anesthesia: General

## 2017-10-10 MED ORDER — ACETAMINOPHEN 10 MG/ML IV SOLN
INTRAVENOUS | Status: AC
  Filled 2017-10-10: qty 100

## 2017-10-10 MED ORDER — BELLADONNA-OPIUM 16.2-30 MG RE SUPP
RECTAL | Status: AC
  Filled 2017-10-10: qty 1

## 2017-10-10 MED ORDER — ATENOLOL 50 MG PO TABS
50.0000 mg | ORAL_TABLET | Freq: Every day | ORAL | Status: DC
  Filled 2017-10-10: qty 1

## 2017-10-10 MED ORDER — LIDOCAINE HCL (CARDIAC) 20 MG/ML IV SOLN
INTRAVENOUS | Status: DC | PRN
  Administered 2017-10-10: 100 mg via INTRAVENOUS

## 2017-10-10 MED ORDER — BELLADONNA ALKALOIDS-OPIUM 16.2-60 MG RE SUPP
RECTAL | Status: DC | PRN
  Administered 2017-10-10: 1 via RECTAL

## 2017-10-10 MED ORDER — LOSARTAN POTASSIUM 50 MG PO TABS
50.0000 mg | ORAL_TABLET | Freq: Every day | ORAL | Status: DC
  Administered 2017-10-10: 50 mg via ORAL
  Filled 2017-10-10: qty 1

## 2017-10-10 MED ORDER — ACETAMINOPHEN 10 MG/ML IV SOLN
1000.0000 mg | Freq: Four times a day (QID) | INTRAVENOUS | Status: AC
  Administered 2017-10-10 – 2017-10-11 (×4): 1000 mg via INTRAVENOUS
  Filled 2017-10-10 (×3): qty 100

## 2017-10-10 MED ORDER — FENTANYL CITRATE (PF) 100 MCG/2ML IJ SOLN: 25.0000 ug | INTRAMUSCULAR | Status: DC | PRN

## 2017-10-10 MED ORDER — POTASSIUM CHLORIDE CRYS ER 20 MEQ PO TBCR
20.0000 meq | EXTENDED_RELEASE_TABLET | Freq: Every day | ORAL | Status: DC
  Administered 2017-10-10: 20 meq via ORAL
  Filled 2017-10-10 (×2): qty 1

## 2017-10-10 MED ORDER — VITAMIN C 500 MG PO TABS
500.0000 mg | ORAL_TABLET | Freq: Every day | ORAL | Status: DC
  Filled 2017-10-10: qty 1

## 2017-10-10 MED ORDER — PROPOFOL 10 MG/ML IV BOLUS
INTRAVENOUS | Status: DC | PRN
  Administered 2017-10-10: 140 mg via INTRAVENOUS

## 2017-10-10 MED ORDER — DIAZEPAM 5 MG PO TABS
5.0000 mg | ORAL_TABLET | Freq: Three times a day (TID) | ORAL | Status: DC | PRN
  Administered 2017-10-10: 5 mg via ORAL
  Filled 2017-10-10: qty 1

## 2017-10-10 MED ORDER — LACTATED RINGERS IV SOLN
INTRAVENOUS | Status: DC
  Administered 2017-10-10: 09:00:00 via INTRAVENOUS

## 2017-10-10 MED ORDER — SODIUM CHLORIDE 0.45 % IV SOLN
INTRAVENOUS | Status: DC
  Administered 2017-10-10: 21:00:00 via INTRAVENOUS
  Administered 2017-10-10: 1000 mL via INTRAVENOUS
  Administered 2017-10-11: 06:00:00 via INTRAVENOUS

## 2017-10-10 MED ORDER — PANTOPRAZOLE SODIUM 40 MG PO TBEC
40.0000 mg | DELAYED_RELEASE_TABLET | Freq: Every day | ORAL | Status: DC
  Administered 2017-10-10: 40 mg via ORAL
  Filled 2017-10-10 (×2): qty 1

## 2017-10-10 MED ORDER — CIPROFLOXACIN IN D5W 400 MG/200ML IV SOLN
INTRAVENOUS | Status: AC
  Filled 2017-10-10: qty 200

## 2017-10-10 MED ORDER — ATORVASTATIN CALCIUM 40 MG PO TABS
40.0000 mg | ORAL_TABLET | Freq: Every day | ORAL | Status: DC
  Administered 2017-10-10: 40 mg via ORAL
  Filled 2017-10-10: qty 1

## 2017-10-10 MED ORDER — MELATONIN 5 MG PO TABS
5.0000 mg | ORAL_TABLET | Freq: Every evening | ORAL | Status: DC | PRN
  Administered 2017-10-10: 5 mg via ORAL
  Filled 2017-10-10: qty 1

## 2017-10-10 MED ORDER — DEXAMETHASONE SODIUM PHOSPHATE 10 MG/ML IJ SOLN
INTRAMUSCULAR | Status: DC | PRN
Start: 2017-10-10 — End: 2017-10-10
  Administered 2017-10-10: 10 mg via INTRAVENOUS

## 2017-10-10 MED ORDER — PROPOFOL 10 MG/ML IV BOLUS
INTRAVENOUS | Status: AC
  Filled 2017-10-10: qty 20

## 2017-10-10 MED ORDER — MAGNESIUM OXIDE 400 (241.3 MG) MG PO TABS
400.0000 mg | ORAL_TABLET | Freq: Every day | ORAL | Status: DC
  Administered 2017-10-10: 400 mg via ORAL
  Filled 2017-10-10 (×2): qty 1

## 2017-10-10 MED ORDER — ONDANSETRON HCL 4 MG/2ML IJ SOLN
INTRAMUSCULAR | Status: AC
  Filled 2017-10-10: qty 2

## 2017-10-10 MED ORDER — AMLODIPINE BESYLATE 5 MG PO TABS
5.0000 mg | ORAL_TABLET | Freq: Every day | ORAL | Status: DC
  Filled 2017-10-10: qty 1

## 2017-10-10 MED ORDER — SODIUM CHLORIDE 0.9 % IR SOLN
Status: DC | PRN
  Administered 2017-10-10: 6000 mL

## 2017-10-10 MED ORDER — HYDROCHLOROTHIAZIDE 25 MG PO TABS
25.0000 mg | ORAL_TABLET | Freq: Every day | ORAL | Status: DC
  Administered 2017-10-10: 25 mg via ORAL
  Filled 2017-10-10 (×2): qty 1

## 2017-10-10 MED ORDER — FENTANYL CITRATE (PF) 100 MCG/2ML IJ SOLN
INTRAMUSCULAR | Status: AC
  Filled 2017-10-10: qty 2

## 2017-10-10 MED ORDER — ONDANSETRON HCL 4 MG/2ML IJ SOLN
INTRAMUSCULAR | Status: DC | PRN
  Administered 2017-10-10: 4 mg via INTRAVENOUS

## 2017-10-10 MED ORDER — ONDANSETRON HCL 4 MG/2ML IJ SOLN
4.0000 mg | Freq: Four times a day (QID) | INTRAMUSCULAR | Status: DC | PRN
Start: 2017-10-10 — End: 2017-10-11

## 2017-10-10 MED ORDER — FENTANYL CITRATE (PF) 100 MCG/2ML IJ SOLN
INTRAMUSCULAR | Status: DC | PRN
  Administered 2017-10-10: 50 ug via INTRAVENOUS
  Administered 2017-10-10 (×2): 25 ug via INTRAVENOUS

## 2017-10-10 MED ORDER — CIPROFLOXACIN IN D5W 400 MG/200ML IV SOLN
400.0000 mg | INTRAVENOUS | Status: AC
  Administered 2017-10-10: 400 mg via INTRAVENOUS

## 2017-10-10 MED ORDER — HYDRALAZINE HCL 20 MG/ML IJ SOLN: 5.0000 mg | INTRAMUSCULAR | Status: DC | PRN

## 2017-10-10 MED ORDER — SODIUM CHLORIDE 0.9 % IR SOLN
3000.0000 mL | Status: DC
  Administered 2017-10-10 (×2): 3000 mL

## 2017-10-10 SURGICAL SUPPLY — 21 items
BAG URINE DRAINAGE (UROLOGICAL SUPPLIES) ×2 IMPLANT
BAG URO CATCHER STRL LF (MISCELLANEOUS) ×2 IMPLANT
CATH FOLEY 2WAY SLVR 30CC 24FR (CATHETERS) ×2 IMPLANT
CATH FOLEY 3WAY 30CC 22FR (CATHETERS) IMPLANT
CATH FOLEY 3WAY 30CC 24FR (CATHETERS)
CATH URTH STD 24FR FL 3W 2 (CATHETERS) IMPLANT
COVER FOOTSWITCH UNIV (MISCELLANEOUS) IMPLANT
COVER SURGICAL LIGHT HANDLE (MISCELLANEOUS) ×2 IMPLANT
ELECT REM PT RETURN 15FT ADLT (MISCELLANEOUS) ×2 IMPLANT
GLOVE BIO SURGEON STRL SZ7.5 (GLOVE) ×2 IMPLANT
GOWN STRL REUS W/ TWL XL LVL3 (GOWN DISPOSABLE) ×1 IMPLANT
GOWN STRL REUS W/TWL XL LVL3 (GOWN DISPOSABLE) ×3 IMPLANT
HOLDER FOLEY CATH W/STRAP (MISCELLANEOUS) IMPLANT
LOOP CUT BIPOLAR 24F LRG (ELECTROSURGICAL) IMPLANT
MANIFOLD NEPTUNE II (INSTRUMENTS) ×2 IMPLANT
PACK CYSTO (CUSTOM PROCEDURE TRAY) ×2 IMPLANT
SET ASPIRATION TUBING (TUBING) IMPLANT
SYR 30ML LL (SYRINGE) IMPLANT
SYRINGE IRR TOOMEY STRL 70CC (SYRINGE) ×2 IMPLANT
TUBING CONNECTING 10 (TUBING) ×2 IMPLANT
TUBING UROLOGY SET (TUBING) ×2 IMPLANT

## 2017-10-10 NOTE — Op Note (Signed)
Preoperative diagnosis:  1. Gross hematuria  Postoperative diagnosis:  1. Same  Procedure: 1. Transurethral resection of prostate, regrowth/residual tissue  Surgeon: Ardis Hughs, MD  Anesthesia: General  Complications: None  Intraoperative findings:  Small exposed vessel in left side wall of prostate within a small cavity in which I re-resected.  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  There hyperemic tissue around the vero that was re-resected as well as the right lateral wall.  The previous TURP defect was otherwise open and unobstructive.  EBL: Minimal  Specimens: Prostate chips  Indication: Nathan Santiago is a 84 y.o. patient with history of obstructive voiding symptoms.  He subsequently underwent a transurethral resection of bladder tumor and his symptoms improved significantly.  However, he had persistent hematuria following this.  Conservative measures have been unsuccessful in controlling his bleeding.  As such, we opted to proceed to the operating room for a re-resection in hopes of clearing his hematuria.  After reviewing the management options for treatment, he elected to proceed with the above surgical procedure(s). We have discussed the potential benefits and risks of the procedure, side effects of the proposed treatment, the likelihood of the patient achieving the goals of the procedure, and any potential problems that might occur during the procedure or recuperation. Informed consent has been obtained.  Description of procedure:  The patient was taken to the operating room and general anesthesia was induced.  The patient was placed in the dorsal lithotomy position, prepped and draped in the usual sterile fashion, and preoperative antibiotics were administered. A preoperative time-out was performed.   21 French 30 degree cystoscope was gently passed through the patient's urethra and into the bladder under  visual guidance.  A cystoscopic evaluation was then performed noting orthotopic ureteral orifice ease and a trabeculated bladder with no other significant abnormalities.  Pulling the scope back to the prostatic urethra a TURP defect was noted.  There was some hypervascularity along the sidewalls.  I then exchanged the 21 French sheath for a 26 French resectoscope sheath passing it and using a visual obturator.  I then exchanged the obturator for the loop resectoscope.  I subsequently Re-resected the lateral walls from 3:00 to 5:00 and 9:00 to 7:00 taking each sidewall down several millimeters.   The adenoma along the anterior aspect was resected completely as was the area around the Vero and posterior urethra.  I then doubled back and fulgarted all these areas a second time ensure good hemostasis.   At this point, hemostasis was noted to be quite good.  The prostate chips were then subsequently removed and reevaluation of the area revealed no significant bleeding.  A 24 French Foley catheter was then inserted into the patient's urethra, 30 cc was instilled into the patient's Foley catheter balloon.  A B&O suppository was placed in the patient's rectum.    The patient was subsequently extubated and returned the PACU in stable condition.    Ardis Hughs, M.D.

## 2017-10-10 NOTE — H&P (Signed)
gross hematuria evaluation  HPI: Nathan Santiago is a 84 year-old male established patient who is here for further evaluation of gross hematuria.[name]  He first noticed the blood 09/08/2016. He has noticed blood only once.   The patient has not had any recent abdominal imaging. He has been told that he has blood in his urine prior to this episode. The patient has had a previous hematuria evaluation.   The patient denies any progression of his voiding symptoms. The patient denies any new or recent onset of back/flank pain or suprapubic pain.   The patient does not have a history of recurrent UTIs. They have a history of kidney stones. He has not been exposed to occupational hazards that may increase their risks for developing cancer. The patient has no family history of GU malignancy. The patient is a former smoker.   05/28/17: The patient was seen today again for recurrent hematuria. This is been ongoing for the past 8 months. He continues on Plavix for his peripheral vascular disease as well as his coronary artery disease. He has several stents both in his heart and his iliac arteries. However, he is very frustrated with the fact that he continues to have some hematuria. I cleared it with Dr. Gwenlyn Found to stop his Plavix for an extended period of time to allow the bleeding to stop and all symptoms resolved.  08/02/17: Today, the patient reports no bleeding for the past month. He has been off of Plavix now for 5 weeks. He reports no additional voiding symptoms including urgency or frequency. He gets up at night once. Reports that he stopped his Plavix on June 26, 2017.   Intv: Patient presents for evaluation of recurrent hematuria. Had TURP in Oct 2018. Has had intermittent hematuira since then, It stopped when he stopped his plavix. The patient had been back on his Plavix about 3 or 4 weeks when he noted gross hematuria again. He is not having any associated dysuria or flank/suprapubic pain. He continues to  void without any difficulties.     ALLERGIES: Lisinopril TABS Niaspan TBCR    MEDICATIONS: Amlodipine Besylate 5 mg tablet Oral  Aspirin 81 MG TABS Oral  Atenolol 50 MG Oral Tablet Oral  Biotin 2,500 mcg capsule Oral  Fish Oil 120 mg-180 mg capsule Oral  Hydrochlorothiazide 25 mg tablet Oral  Lipitor 20 mg tablet Oral  Magnesium Oxide 400 mg magnesium tablet Oral  Metoclopramide HCl - 5 MG Oral Tablet Oral  Multi-Day Vitamins TABS Oral  Omega 3 684 mg-1,200 mg capsule,delayed release Oral  Pantoprazole Sodium 40 mg tablet, delayed release 1 tablet PO Daily  Plavix 75 mg tablet Oral  Potassium Chloride 10 meq capsule, extended release Oral  Tramadol Hcl 50 mg tablet 0 Oral  Vegetarian Glucosamine 750 mg tablet Oral  Vitamin C TABS Oral  Vitamin D3 1000 UNIT Oral Tablet Oral  Vytorin 10-80 MG Oral Tablet Oral     GU PSH: Cystoscopy - 06/19/2017, 08/15/2016 Cystoscopy TURP - 09/08/2016 TUMT - 2011      PSH Notes: Cataract Surgery, Surg Prostate Transureth Dest Tissue Microwave Thermotherapy, stent placed in artery of groin (2017)   NON-GU PSH: None   GU PMH: Gross hematuria - 08/02/2017, - 06/19/2017, - 05/25/2017, - 05/11/2017, - 04/13/2017, - 12/04/2016, I suspect the patient has contracted a symptomatic urinary tract infection. This would be consistent with his back pain and the dysuria he is experiencing. The hematuria correlates with this and him also being on Plavix., - 11/21/2016, -  10/04/2016 Incomplete bladder emptying - 05/11/2017 Hemorrhagic cystitis - 12/04/2016 Prostate Cancer (Stable), Low volume intermediate risk prostate cancer with a very low PSA. Likely clinically insignificant, we will continue to trend his PSA to ensure it stays clinically Insignificant. - 11/21/2016 Urinary Retention - 09/27/2016, - 09/15/2016 BPH w/LUTS - 08/15/2016, Benign prostatic hyperplasia with urinary obstruction, - 10/12/2015 History of urolithiasis, History of kidney stones - 10/12/2015 Renal and  ureteral calculus, Calculus of kidney with calculus of ureter - 5400 Renal colic, Renal colic - 8676 Urinary Retention, Unspec, Incomplete bladder emptying - 2016 Urinary Urgency, Urinary urgency - 2016 Urge incontinence, Urge incontinence of urine - 2016 Urinary Tract Inf, Unspec site, Pyuria - 2014    NON-GU PMH: Encounter for general adult medical examination without abnormal findings, Encounter for preventive health examination - 10/12/2015 Personal history of other diseases of the circulatory system, History of cardiac disorder - 2016, History of hypertension, - 2014 Personal history of other diseases of the digestive system, History of esophageal reflux - 2016 Personal history of other endocrine, nutritional and metabolic disease, History of hypercholesterolemia - 2014    FAMILY HISTORY: Atherosclerosis - Runs In Family Death - Father, Mother Death of family member - Runs In [place] Status Number - Runs In Family nephrolithiasis - Runs In Family   SOCIAL HISTORY: Marital Status: Married Preferred Language: English; Ethnicity: Not Hispanic Or Latino; Race: White Current Smoking Status: Patient does not smoke anymore. Has not smoked since 08/31/1986.   Tobacco Use Assessment Completed: Used Tobacco in last 30 days? Drinks 2 drinks per week.  Does not drink caffeine. Patient's occupation is/was Retired.     Notes: Former smoker, Alcohol Use, Marital History - Currently Married, Occupation:, Caffeine Use, Tobacco Use   REVIEW OF SYSTEMS:    GU Review Male:   Patient denies frequent urination, hard to postpone urination, burning/ pain with urination, get up at night to urinate, leakage of urine, stream starts and stops, trouble starting your stream, have to strain to urinate , erection problems, and penile pain.  Gastrointestinal (Upper):   Patient denies nausea, vomiting, and indigestion/ heartburn.  Gastrointestinal (Lower):   Patient denies diarrhea and constipation.   Constitutional:   Patient denies fever, night sweats, weight loss, and fatigue.  Skin:   Patient denies skin rash/ lesion and itching.  Eyes:   Patient denies blurred vision and double vision.  Ears/ Nose/ Throat:   Patient denies sore throat and sinus problems.  Hematologic/Lymphatic:   Patient denies swollen glands and easy bruising.  Cardiovascular:   Patient denies leg swelling and chest pains.  Respiratory:   Patient denies cough and shortness of breath.  Endocrine:   Patient denies excessive thirst.  Musculoskeletal:   Patient denies back pain and joint pain.  Neurological:   Patient denies headaches and dizziness.  Psychologic:   Patient denies depression and anxiety.   Notes: blood in urine every time PT urinates. Pt is emptying bladder completely    VITAL SIGNS:      09/25/2017 09:10 AM  Weight 174 lb / 78.93 kg  Height 65 in / 165.1 cm  BP 190/66 mmHg  Temperature 97.9 F / 36.6 C  BMI 29.0 kg/m   MULTI-SYSTEM PHYSICAL EXAMINATION:    Constitutional: Well-nourished. No physical deformities. Normally developed. Good grooming.  Respiratory: No labored breathing, no use of accessory muscles. Normal breath sounds. clear to auscultation bilaterally  Cardiovascular: Regular rate and rhythm. No murmur, no gallop. Normal temperature, normal extremity pulses, no  swelling, no varicosities. Regular rate and rhythm     PAST DATA REVIEWED:  Source Of History:  Patient  Records Review:   Pathology Reports, Previous Doctor Records, Previous Patient Records, POC Tool   08/02/17 02/27/07 01/12/06 01/10/05 07/08/04 01/06/04 01/22/03  PSA  Total PSA 3.33 ng/mL 0.60  0.48  0.41  0.50  0.13  0.20     PROCEDURES:          Urinalysis w/Scope Dipstick Dipstick Cont'd Micro  Color: Amber Bilirubin: Neg WBC/hpf: NS (Not Seen)  Appearance: Cloudy Ketones: Neg RBC/hpf: >60/hpf  Specific Gravity: 1.020 Blood: 3+ Bacteria: NS (Not Seen)  pH: <=5.0 Protein: 1+ Cystals: NS (Not Seen)   Glucose: Neg Urobilinogen: 0.2 Casts: NS (Not Seen)    Nitrites: Neg Trichomonas: Not Present    Leukocyte Esterase: Neg Mucous: Not Present      Epithelial Cells: 0 - 5/hpf      Yeast: NS (Not Seen)      Sperm: Not Present    ASSESSMENT:      ICD-10 Details  1 GU:   Gross hematuria - R31.0   2   Prostate Cancer - C61    PLAN:           Orders Labs Urine Culture          Schedule Return Visit/Planned Activity: ASAP - Schedule Surgery          Document Letter(s):  Created for Patient: Clinical Summary         Notes:   The patient and I discussed his ongoing intermittent gross hematuria. We discussed treatment options including initiating finasteride and inpatient over the next 3-6 months to see if it makes any significant. We also discussed stopping his Plavix again, but there is no guarantee that it did not return once he resumed his Plavix as happened last time. Finally, we discussed repeat resection of the residual tissue and apparent vasculature. I went through this procedure again with him in detail and he has opted to proceed with this. He will need to stop his Plavix prior. We'll result to Dr. Gwenlyn Found to ensure that he is clear from a cardiac perspective.

## 2017-10-10 NOTE — Anesthesia Postprocedure Evaluation (Signed)
Anesthesia Post Note  Patient: Nathan Santiago [facility]  Procedure(s) Performed: TRANSURETHRAL RESECTION OF THE PROSTATE (TURP) (N/A )     Patient location during evaluation: PACU Anesthesia Type: General Level of consciousness: awake and alert Pain management: pain level controlled Vital Signs Assessment: post-procedure vital signs reviewed and stable Respiratory status: spontaneous breathing, nonlabored ventilation, respiratory function stable and patient connected to nasal cannula oxygen Cardiovascular status: blood pressure returned to baseline and stable Postop Assessment: no apparent nausea or vomiting Anesthetic complications: no    Last Vitals:  Vitals:   10/10/17 1200 10/10/17 1252  BP: (!) 159/62 (!) 158/68  Pulse: 86 84  Resp: 16 16  Temp: 37 C 36.6 C  SpO2: 99% 95%    Last Pain:  Vitals:   10/10/17 0841  TempSrc: Oral                 [name]

## 2017-10-10 NOTE — Anesthesia Procedure Notes (Signed)
Procedure Name: LMA Insertion Date/Time: 10/10/2017 10:28 AM Performed by: Glory Buff, CRNA Pre-anesthesia Checklist: Patient identified, Emergency Drugs available, Suction available and Patient being monitored Patient Re-evaluated:Patient Re-evaluated prior to induction Oxygen Delivery Method: Circle system utilized Preoxygenation: Pre-oxygenation with 100% oxygen Induction Type: IV induction LMA: LMA inserted LMA Size: 4.0 Number of attempts: 1 Placement Confirmation: positive ETCO2 Tube secured with: Tape Dental Injury: Teeth and Oropharynx as per pre-operative assessment

## 2017-10-10 NOTE — Transfer of Care (Signed)
Immediate Anesthesia Transfer of Care Note  Patient: Nathan Santiago [facility]  Procedure(s) Performed: TRANSURETHRAL RESECTION OF THE PROSTATE (TURP) (N/A )  Patient Location: PACU  Anesthesia Type:General  Level of Consciousness: awake, alert  and oriented  Airway & Oxygen Therapy: Patient Spontanous Breathing and Patient connected to face mask oxygen  Post-op Assessment: Report given to RN and Post -op Vital signs reviewed and stable  Post vital signs: Reviewed and stable  Last Vitals:  Vitals:   10/10/17 0841 10/10/17 0842  BP:  (!) 164/61  Pulse:  100  Resp:  18  Temp: 36.7 C   SpO2:  96%    Last Pain:  Vitals:   10/10/17 0841  TempSrc: Oral         Complications: No apparent anesthesia complications

## 2017-10-10 NOTE — Anesthesia Preprocedure Evaluation (Addendum)
Anesthesia Evaluation  Patient identified by MRN, date of birth, ID band Patient awake    Reviewed: Allergy & Precautions, H&P , Patient's Chart, lab work & pertinent test results, reviewed documented beta blocker date and time   Airway Mallampati: II  TM Distance: >3 FB Neck ROM: full    Dental no notable dental hx.    Pulmonary former smoker,    Pulmonary exam normal breath sounds clear to auscultation       Cardiovascular hypertension, + CAD   Rhythm:regular Rate:Normal     Neuro/Psych    GI/Hepatic   Endo/Other    Renal/GU      Musculoskeletal   Abdominal   Peds  Hematology   Anesthesia Other Findings   Reproductive/Obstetrics                             Anesthesia Physical Anesthesia Plan  ASA: III  Anesthesia Plan: General   Post-op Pain Management:    Induction: Intravenous  PONV Risk Score and Plan:   Airway Management Planned: LMA  Additional Equipment:   Intra-op Plan:   Post-operative Plan:   Informed Consent: I have reviewed the patients History and Physical, chart, labs and discussed the procedure including the risks, benefits and alternatives for the proposed anesthesia with the patient or authorized representative who has indicated his/her understanding and acceptance.   Dental Advisory Given  Plan Discussed with: CRNA and Surgeon  Anesthesia Plan Comments: ( )        Anesthesia Quick Evaluation

## 2017-10-11 ENCOUNTER — Encounter (HOSPITAL_COMMUNITY): Payer: Self-pay | Admitting: Urology

## 2017-10-11 DIAGNOSIS — Z87891 Personal history of nicotine dependence: Secondary | ICD-10-CM | POA: Diagnosis not present

## 2017-10-11 DIAGNOSIS — R310 Gross hematuria: Secondary | ICD-10-CM | POA: Diagnosis not present

## 2017-10-11 DIAGNOSIS — N411 Chronic prostatitis: Secondary | ICD-10-CM | POA: Diagnosis not present

## 2017-10-11 DIAGNOSIS — I2510 Atherosclerotic heart disease of native coronary artery without angina pectoris: Secondary | ICD-10-CM | POA: Diagnosis not present

## 2017-10-11 DIAGNOSIS — N400 Enlarged prostate without lower urinary tract symptoms: Secondary | ICD-10-CM | POA: Diagnosis not present

## 2017-10-11 DIAGNOSIS — I739 Peripheral vascular disease, unspecified: Secondary | ICD-10-CM | POA: Diagnosis not present

## 2017-10-11 MED ORDER — PHENAZOPYRIDINE HCL 200 MG PO TABS: 200.0000 mg | ORAL_TABLET | Freq: Three times a day (TID) | ORAL | 0 refills | Status: DC | PRN

## 2017-10-11 NOTE — Progress Notes (Signed)
Foley catheter removed per MD's order

## 2017-10-11 NOTE — Discharge Summary (Signed)
Date of admission: 10/10/2017  Date of discharge: 10/11/2017  Admission diagnosis: gross hematuria  Discharge diagnosis: same  Secondary diagnoses:  Patient Active Problem List   Diagnosis Date Noted  . Hematuria 10/10/2017  . Gait abnormality 02/13/2017  . UTI (urinary tract infection) 11/22/2016  . Vertigo 11/22/2016  . Hypokalemia 11/22/2016  . Urinary frequency 09/08/2016  . Claudication ([place]) 04/24/2016  . Bilateral carotid artery disease ([place]) 08/07/2014  . Peripheral arterial disease ([place]) 08/06/2013  . GASTROPARESIS 02/25/2009  . COLONIC POLYPS, ADENOMATOUS, HX OF 02/22/2009  . HYPERCHOLESTEROLEMIA 09/26/2007  . Essential hypertension 09/26/2007  . Coronary atherosclerosis 09/26/2007  . ALLERGIC RHINITIS 09/26/2007  . GERD 09/26/2007  . Peptic ulcer 09/26/2007  . UMBILICAL HERNIA 21/74/7159  . NEPHROLITHIASIS 09/26/2007  . BLADDER STONE 09/26/2007    Procedures performed: Procedure(s): TRANSURETHRAL RESECTION OF THE PROSTATE (TURP)  History and Physical: For full details, please see admission history and physical. Briefly, Nathan Santiago is a 84 y.o. year old patient with prolonged gross hematuria following TURP.   Hospital Course: Patient tolerated the procedure well.  He was then transferred to the floor after an uneventful PACU stay.  His hospital course was uncomplicated.  On POD#1 he had met discharge criteria: was eating a regular diet, was up and ambulating independently,  pain was well controlled, was voiding without a catheter, and was ready to for discharge.  PE: Vitals:   10/10/17 1200 10/10/17 1252 10/10/17 2014 10/11/17 0455  BP: (!) 159/62 (!) 158/68 (!) 190/51 (!) 158/57  Pulse: 86 84 (!) 108 87  Resp: '16 16 16 16  ' Temp: 98.6 F (37 C) 97.8 F (36.6 C) 97.6 F (36.4 C) 98 F (36.7 C)  TempSrc:   Oral Oral  SpO2: 99% 95% 95% 95%  Weight:      Height:      NAD Abdomen soft Foley catheter out Extremities symmetric  Laboratory values:   Recent Labs    10/09/17 0851  WBC 6.7  HGB 12.7*  HCT 38.6*   Recent Labs    10/09/17 0851  NA 142  K 4.4  CL 107  CO2 26  GLUCOSE 141*  BUN 20  CREATININE 0.82  CALCIUM 9.5   No results for input(s): LABPT, INR in the last 72 hours. No results for input(s): LABURIN in the last 72 hours. Results for orders placed or performed during the hospital encounter of 11/22/16  Urine culture     Status: Abnormal   Collection Time: 11/22/16  7:23 PM  Result Value Ref Range Status   Specimen Description URINE, RANDOM  Final   Special Requests NONE  Final   Culture (A)  Final    <10,000 COLONIES/mL INSIGNIFICANT GROWTH Performed at [facility], [address] [place], [place] [address]    Report Status 11/24/2016 FINAL  Final  Culture, blood (Routine X 2) w Reflex to ID Panel     Status: None   Collection Time: 11/22/16 11:51 PM  Result Value Ref Range Status   Specimen Description BLOOD RIGHT HAND  Final   Special Requests IN PEDIATRIC BOTTLE Blood Culture adequate volume  Final   Culture   Final    NO GROWTH 5 DAYS Performed at [facility], [place] [address], [place], [place] [address]    Report Status 11/28/2016 FINAL  Final  Culture, blood (Routine X 2) w Reflex to ID Panel     Status: None   Collection Time: 11/23/16 12:05 AM  Result  Value Ref Range Status   Specimen Description BLOOD LEFT ANTECUBITAL  Final   Special Requests   Final    BOTTLES DRAWN AEROBIC AND ANAEROBIC Blood Culture adequate volume   Culture   Final    NO GROWTH 5 DAYS Performed at [facility], [address], [place], [place] [address]    Report Status 11/28/2016 FINAL  Final    Disposition: Home  Discharge instruction: The patient was instructed to be ambulatory but told to refrain from heavy lifting, strenuous activity, or driving.   Discharge medications:  Allergies as of 10/11/2017      Reactions   Lisinopril Cough   Niaspan [niacin Er] Rash      Medication List     STOP taking these medications   aspirin 81 MG tablet   clopidogrel 75 MG tablet Commonly known as:  PLAVIX     TAKE these medications   amLODipine 5 MG tablet Commonly known as:  NORVASC Take 5 mg by mouth daily.   atenolol 50 MG tablet Commonly known as:  TENORMIN Take 50 mg by mouth daily.   atorvastatin 40 MG tablet Commonly known as:  LIPITOR Take 40 mg by mouth daily at 6 PM.   benzonatate 100 MG capsule Commonly known as:  TESSALON Take 100 mg by mouth 3 (three) times daily as needed for cough.   Biotin 5000 MCG Caps Take 5,000 mcg by mouth daily.   CALCIUM 600+D PO Take 1 tablet by mouth daily.   diazepam 5 MG tablet Commonly known as:  VALIUM Take 5 mg by mouth every 8 (eight) hours as needed for anxiety.   fish oil-omega-3 fatty acids 1000 MG capsule Take 2 g by mouth daily.   GLUCOSAMINE 1500 COMPLEX PO Take 1,500 mg by mouth daily.   hydrochlorothiazide 25 MG tablet Commonly known as:  HYDRODIURIL Take 25 mg by mouth daily.   losartan 50 MG tablet Commonly known as:  COZAAR Take 50 mg by mouth daily.   LUBRICATING EYE DROPS OP Apply 1 drop to eye daily as needed (dry eyes).   magnesium oxide 400 MG tablet Commonly known as:  MAG-OX Take 400 mg by mouth daily.   Melatonin 5 MG Caps Take 5 mg by mouth at bedtime as needed (for sleep).   MULTIVITAMIN PO Take 1 tablet by mouth daily.   pantoprazole 40 MG tablet Commonly known as:  PROTONIX Take 1 tablet (40 mg total) by mouth daily.   phenazopyridine 200 MG tablet Commonly known as:  PYRIDIUM Take 1 tablet (200 mg total) by mouth 3 (three) times daily as needed for pain.   potassium chloride SA 20 MEQ tablet Commonly known as:  K-DUR,KLOR-CON Take 20 mEq by mouth daily.   vitamin C 500 MG tablet Commonly known as:  ASCORBIC ACID Take 500 mg by mouth daily.   Vitamin D3 1000 units Caps Take 1,000 Units by mouth daily.       Followup:  Follow-up Information    Jed Limerick, NP On 10/24/2017.   Specialty:  Urology Why:  8:15pm Contact information: [address] [place] [address] [phone]

## 2017-10-24 DIAGNOSIS — R310 Gross hematuria: Secondary | ICD-10-CM | POA: Diagnosis not present

## 2017-11-12 ENCOUNTER — Ambulatory Visit (INDEPENDENT_AMBULATORY_CARE_PROVIDER_SITE_OTHER): Payer: Medicare Other | Admitting: Adult Health

## 2017-11-12 ENCOUNTER — Encounter: Payer: Self-pay | Admitting: Adult Health

## 2017-11-12 VITALS — BP 138/62 | HR 80 | Ht 64.0 in | Wt 180.0 lb

## 2017-11-12 DIAGNOSIS — R269 Unspecified abnormalities of gait and mobility: Secondary | ICD-10-CM

## 2017-11-12 DIAGNOSIS — R42 Dizziness and giddiness: Secondary | ICD-10-CM

## 2017-11-12 NOTE — Progress Notes (Signed)
I have read the note, and I agree with the clinical assessment and plan.  Charles K Willis   

## 2017-11-12 NOTE — Progress Notes (Signed)
PATIENT: Nathan Santiago [facility] DOB: 01-14-1931  REASON FOR VISIT: follow up HISTORY FROM: patient  HISTORY OF PRESENT ILLNESS: Today 11/12/17 Mr. Nathan Santiago is an 84 year old male with a history of vertigo and difficulty with his balance.  He returns today for follow-up.  He states that he was doing well up until last week.  Reports that his balance has been off.  States that he has been holding onto the walls when he ambulates.  Reports that he feels as if he is veering off to the right or left.  He denies any vertigo episodes such as room spinning sensation.  He states that when he went to physical therapy in the past this offered him significant benefit.  He does note that he did not continue doing the exercises that he was taught.  He denies any additional symptoms such as weakness in the arms or legs or numbness.  He returns today for evaluation.  HISTORY 06/13/17: 06/13/17 Mr. Nathan Santiago is an 84 year old male with a history of vertigo and difficulty with his balance.  He returns today for follow-up.  He reports that he did go to physical therapy.  He states that they help significantly with his balance.  He states that he has no longer having any issues.  He has not had an episode of vertigo since his last visit.He states typically if he begins to have dizziness if he takes Valium for 3 days this resolves his symptoms. he reports in the past his vertigo could last 2-3 weeks.  He returns today for an evaluation.   REVIEW OF SYSTEMS: Out of a complete 14 system review of symptoms, the patient complains only of the following symptoms, and all other reviewed systems are negative.   Blood In urine  ALLERGIES: Allergies  Allergen Reactions  . Lisinopril Cough  . Niaspan [Niacin Er] Rash    HOME MEDICATIONS: Outpatient Medications Prior to Visit  Medication Sig Dispense Refill  . amLODipine (NORVASC) 5 MG tablet Take 5 mg by mouth daily.      [name] atenolol (TENORMIN) 50 MG tablet Take 50  mg by mouth daily.      [name] atorvastatin (LIPITOR) 40 MG tablet Take 40 mg by mouth daily at 6 PM.     . benzonatate (TESSALON) 100 MG capsule Take 100 mg by mouth 3 (three) times daily as needed for cough.   0  . Biotin 5000 MCG CAPS Take 5,000 mcg by mouth daily.    . Calcium Carbonate-Vitamin D (CALCIUM 600+D PO) Take 1 tablet by mouth daily.    . Carboxymethylcellul-Glycerin (LUBRICATING EYE DROPS OP) Apply 1 drop to eye daily as needed (dry eyes).    . Cholecalciferol (VITAMIN D3) 1000 units CAPS Take 1,000 Units by mouth daily.    . diazepam (VALIUM) 5 MG tablet Take 5 mg by mouth every 8 (eight) hours as needed for anxiety.    . fish oil-omega-3 fatty acids 1000 MG capsule Take 2 g by mouth daily.      . Glucosamine-Chondroit-Vit C-Mn (GLUCOSAMINE 1500 COMPLEX PO) Take 1,500 mg by mouth daily.    . hydrochlorothiazide 25 MG tablet Take 25 mg by mouth daily.      [name] losartan (COZAAR) 50 MG tablet Take 50 mg by mouth daily.    . magnesium oxide (MAG-OX) 400 MG tablet Take 400 mg by mouth daily.    . Melatonin 5 MG CAPS Take 5 mg by mouth at bedtime as needed (for sleep).     [name]  Multiple Vitamin (MULTIVITAMIN PO) Take 1 tablet by mouth daily.     . pantoprazole (PROTONIX) 40 MG tablet Take 1 tablet (40 mg total) by mouth daily. 30 tablet 12  . phenazopyridine (PYRIDIUM) 200 MG tablet Take 1 tablet (200 mg total) by mouth 3 (three) times daily as needed for pain. 10 tablet 0  . potassium chloride SA (K-DUR,KLOR-CON) 20 MEQ tablet Take 20 mEq by mouth daily.     . traMADol (ULTRAM) 50 MG tablet Take 50 mg by mouth every 6 (six) hours as needed.    . vitamin C (ASCORBIC ACID) 500 MG tablet Take 500 mg by mouth daily.     No facility-administered medications prior to visit.     PAST MEDICAL HISTORY: Past Medical History:  Diagnosis Date  . AAA (abdominal aortic aneurysm) (HCC)    asymptomatic  . AAA (abdominal aortic aneurysm) ([place]) 2010   peripheral  angiogram-- bilateral SFA DISEASE  and  60 to 70% infrarenaal abd. aortic stenosis with 15 -mm gradient  . Adenomatous colon polyp 11/2003  . CAD (coronary artery disease)   . Gait abnormality 02/13/2017  . Gastroparesis    pt denies  . GERD (gastroesophageal reflux disease)    w/ LPR  . History of kidney stones    x2  . Hyperlipidemia   . Hypertension   . Peripheral arterial disease ([place])    RCEA  by Dr Lemar Livings  . PUD (peptic ulcer disease)   . Vertigo     PAST SURGICAL HISTORY: Past Surgical History:  Procedure Laterality Date  . AORTOGRAM  04/24/2016    Abdominal aortogram, bilateral iliac angiogram, bifemoral runoff  . CARDIAC CATHETERIZATION  05/12/2005   RCA  . carotid doppler  01/24/2013   RICA endarterectomy,left CCA 0-49%; left bulb and prox ICA 50-69%; bilaateral subclavian < 50%  . CAROTID ENDARTERECTOMY  11/01/2010  . CATARACT EXTRACTION    . CORONARY ANGIOPLASTY  05/18/2005   2 STENTS distal RCA AND PROXIMAL-MID RCA   5 total stents per pt.  . DOPPLER ECHOCARDIOGRAPHY  02/07/2012   EF 55%,SHOWED NO ISCHEMIA   . HERNIA REPAIR     umbilical  . lower arterial  doppler  02/04/2013   aotra 1.5 x 1.5 cm; distal abd aorta 70-99%,proximal common iliac arteries -very stenotic with increased velocities>50%,may be falsely elevated as a result of residual plaque from the distal aorta stenosis  . lower extremity doppler  June 18 ,2013   ABI'S ABNORMAL, RABI was 0.88 and LABI 0.75 ,with 3-vessel  run off  . NM MYOVIEW LTD  MAY [id number]   showed no significant ischemia;  . NM MYOVIEW LTD  04/22/2008   ef 77%,exercise capcity 6 METS ,exaggerated blood pressure response to exercise  . PERIPHERAL VASCULAR CATHETERIZATION N/A 04/24/2016   Procedure: Lower Extremity Angiography;  Surgeon: Lorretta Harp, MD;  Location: [place] [facility];  Service: Cardiovascular;  Laterality: N/A;  . PERIPHERAL VASCULAR CATHETERIZATION  04/24/2016   Procedure: Peripheral Vascular Intervention;  Surgeon: Lorretta Harp, MD;   Location: [place] [facility];  Service: Cardiovascular;;  Aorta  . retrograde central aortic catheterization  05/19/2005   cutting balloon atherectomy, c-circ stenosis with DES STENTING CYPHER  . TONSILLECTOMY    . TRANSURETHRAL RESECTION OF PROSTATE N/A 09/08/2016   Procedure: TRANSURETHRAL RESECTION OF THE PROSTATE (TURP);  Surgeon: Ardis Hughs, MD;  Location: [facility];  Service: Urology;  Laterality: N/A;  . TRANSURETHRAL RESECTION OF PROSTATE     10-10-17  Dr. Louis Meckel  . TRANSURETHRAL RESECTION OF PROSTATE N/A 10/10/2017   Procedure: TRANSURETHRAL RESECTION OF THE PROSTATE (TURP);  Surgeon: Ardis Hughs, MD;  Location: [facility];  Service: Urology;  Laterality: N/A;  . VASECTOMY      FAMILY HISTORY: Family History  Problem Relation Age of Onset  . Ovarian cancer Sister   . Heart disease Father   . Brain cancer Brother        Tumor    SOCIAL HISTORY: Social History   Socioeconomic History  . Marital status: Married    Spouse name: [name]  . Number of children: Not on file  . Years of education: BA  . Highest education level: Not on file  Occupational History  . Occupation: Retired  Scientific laboratory technician  . Financial resource strain: Not on file  . Food insecurity:    Worry: Not on file    Inability: Not on file  . Transportation needs:    Medical: Not on file    Non-medical: Not on file  Tobacco Use  . Smoking status: Former Smoker    Last attempt to quit: 07/31/1968    Years since quitting: 49.3  . Smokeless tobacco: Never Used  Substance and Sexual Activity  . Alcohol use: Yes    Alcohol/week: 2.4 oz    Types: 4 Glasses of wine per week    Comment: OCcASIONAL  . Drug use: No  . Sexual activity: Not Currently  Lifestyle  . Physical activity:    Days per week: Not on file    Minutes per session: Not on file  . Stress: Not on file  Relationships  . Social connections:    Talks on phone: Not on file    Gets together: Not on file    Attends religious service:  Not on file    Active member of club or organization: Not on file    Attends meetings of clubs or organizations: Not on file    Relationship status: Not on file  . Intimate partner violence:    Fear of current or ex partner: Not on file    Emotionally abused: Not on file    Physically abused: Not on file    Forced sexual activity: Not on file  Other Topics Concern  . Not on file  Social History Narrative   Lives with wife   Caffeine use: Decaf coffee, tea   Left handed      PHYSICAL EXAM  Vitals:   11/12/17 1458  BP: 138/62  Pulse: 80  Weight: 180 lb (81.6 kg)  Height: 5\' 4"  (1.626 m)   Body mass index is 30.9 kg/m.  Generalized: Well developed, in no acute distress   Neurological examination  Mentation: Alert oriented to time, place, history taking. Follows all commands speech and language fluent Cranial nerve II-XII: Pupils were equal round reactive to light. Extraocular movements were full, visual field were full on confrontational test. Facial sensation and strength were normal. Uvula tongue midline. Head turning and shoulder shrug  were normal and symmetric. Motor: The motor testing reveals 5 over 5 strength of all 4 extremities. Good symmetric motor tone is noted throughout.  Sensory: Sensory testing is intact to soft touch on all 4 extremities. No evidence of extinction is noted.  Coordination: Cerebellar testing reveals good finger-nose-finger and heel-to-shin bilaterally.  Gait and station: Gait is normal. Tandem gait is normal. Romberg is negative. No drift is seen.  Reflexes: Deep tendon reflexes are symmetric and normal bilaterally.  DIAGNOSTIC DATA (LABS, IMAGING, TESTING) - I reviewed patient records, labs, notes, testing and imaging myself where available.  Lab Results  Component Value Date   WBC 6.7 10/09/2017   HGB 12.7 (L) 10/09/2017   HCT 38.6 (L) 10/09/2017   MCV 89.1 10/09/2017   PLT 226 10/09/2017      Component Value Date/Time   NA 142  10/09/2017 0851   K 4.4 10/09/2017 0851   CL 107 10/09/2017 0851   CO2 26 10/09/2017 0851   GLUCOSE 141 (H) 10/09/2017 0851   BUN 20 10/09/2017 0851   CREATININE 0.82 10/09/2017 0851   CREATININE 0.79 04/12/2016 1353   CALCIUM 9.5 10/09/2017 0851   PROT 7.6 11/22/2016 1814   ALBUMIN 3.7 11/22/2016 1814   AST 22 11/22/2016 1814   ALT 24 11/22/2016 1814   ALKPHOS 72 11/22/2016 1814   BILITOT 0.6 11/22/2016 1814   GFRNONAA >60 10/09/2017 0851   GFRAA >60 10/09/2017 0851   Lab Results  Component Value Date   CHOL 108 11/23/2016   HDL 45 11/23/2016   LDLCALC 44 11/23/2016   TRIG 94 11/23/2016   CHOLHDL 2.4 11/23/2016   Lab Results  Component Value Date   HGBA1C 5.5 11/23/2016   Lab Results  Component Value Date   [id number] 867 02/13/2017   Lab Results  Component Value Date   TSH 1.95 04/12/2016    MRI lumbar without contrast spine November 23, 2016:  IMPRESSION: Moderate to severe L4-5 facet arthropathy. Multilevel annular Fissures.  MRI brain without contrast November 22, 2016:  IMPRESSION: Chronic microvascular disease without acute intracranial abnormality.   ASSESSMENT AND PLAN 84 y.o. year old male  has a past medical history of AAA (abdominal aortic aneurysm) ([place]), AAA (abdominal aortic aneurysm) ([place]) (2010), Adenomatous colon polyp (11/2003), CAD (coronary artery disease), Gait abnormality (02/13/2017), Gastroparesis, GERD (gastroesophageal reflux disease), History of kidney stones, Hyperlipidemia, Hypertension, Peripheral arterial disease ([place]), PUD (peptic ulcer disease), and Vertigo. here with:  1.  Vertigo 2.  Abnormality of gait and balance  The patient is having reoccurring issues with his balance.  This is been going on for approximately 1 week.  We will refer the patient back to physical therapy.  If this is not beneficial for his symptoms he will let us know.  The patient has had an MRI of the brain and lumbar spine in the past.  He will follow-up in 6  months or sooner if needed.   Ward Givens, MSN, NP-C 11/12/2017, 3:06 PM [facility] [address] [place], [place] [address] [phone]

## 2017-11-12 NOTE — Patient Instructions (Addendum)
Your Plan:  Referral to physical therapy  If your symptoms worsen or you develop new symptoms please let us know.   Thank you for coming to see [place] at [facility]. I hope we have been able to provide you high quality care today.  You may receive a patient satisfaction survey over the next few weeks. We would appreciate your feedback and comments so that we may continue to improve ourselves and the health of our patients.

## 2017-11-20 ENCOUNTER — Other Ambulatory Visit: Payer: Self-pay

## 2017-11-20 ENCOUNTER — Encounter: Payer: Self-pay | Admitting: Physical Therapy

## 2017-11-20 ENCOUNTER — Ambulatory Visit: Payer: Medicare Other | Attending: Adult Health | Admitting: Physical Therapy

## 2017-11-20 ENCOUNTER — Telehealth: Payer: Self-pay | Admitting: Physical Therapy

## 2017-11-20 DIAGNOSIS — M6281 Muscle weakness (generalized): Secondary | ICD-10-CM | POA: Diagnosis not present

## 2017-11-20 DIAGNOSIS — R2681 Unsteadiness on feet: Secondary | ICD-10-CM | POA: Insufficient documentation

## 2017-11-20 DIAGNOSIS — R310 Gross hematuria: Secondary | ICD-10-CM | POA: Diagnosis not present

## 2017-11-20 DIAGNOSIS — R42 Dizziness and giddiness: Secondary | ICD-10-CM

## 2017-11-20 DIAGNOSIS — R2689 Other abnormalities of gait and mobility: Secondary | ICD-10-CM | POA: Diagnosis not present

## 2017-11-20 DIAGNOSIS — R3914 Feeling of incomplete bladder emptying: Secondary | ICD-10-CM | POA: Diagnosis not present

## 2017-11-20 NOTE — Telephone Encounter (Signed)
Hello Dr. Louis Meckel, Nathan Santiago has returned to outpatient PT for further balance training.  He stated that when he was in the hospital in March he was advised to avoid strenuous activities.  I read in your D/C summary: Discharge instruction: The patient was instructed to be ambulatory but told to refrain from heavy lifting, strenuous activity, or driving.   Is Nathan Santiago still under these activity restrictions and if so, do you see any reason why he may not be able to participate in balance therapy?  We would not be doing any heavy lifting.  Thank you for your guidance, Rico Junker, PT, DPT 11/20/17    10:28 PM

## 2017-11-20 NOTE — Therapy (Signed)
[facility] [address] [place], [place], [address] Phone: [phone]   Fax:  [phone]  Physical Therapy Evaluation  Patient Details  Name: Nathan Santiago MRN: [id number] Date of Birth: 82/03/1931 Referring Provider: Ward Givens, NP; Kathrynn Ducking, MD   Encounter Date: 11/20/2017  PT End of Session - 11/20/17 2201    Visit Number  1    Number of Visits  17    Date for PT Re-Evaluation  01/19/18    Authorization Type  Medicare, A[employer]    PT Start Time  1450    PT Stop Time  1537    PT Time Calculation (min)  47 min    Activity Tolerance  Patient tolerated treatment well    Behavior During Therapy  [facility] for tasks assessed/performed       Past Medical History:  Diagnosis Date  . AAA (abdominal aortic aneurysm) (HCC)    asymptomatic  . AAA (abdominal aortic aneurysm) ([facility]) 2010   peripheral  angiogram-- bilateral SFA DISEASE  and 60 to 70% infrarenaal abd. aortic stenosis with 15 -mm gradient  . Adenomatous colon polyp 11/2003  . CAD (coronary artery disease)   . Gait abnormality 02/13/2017  . Gastroparesis    pt denies  . GERD (gastroesophageal reflux disease)    w/ LPR  . History of kidney stones    x2  . Hyperlipidemia   . Hypertension   . Peripheral arterial disease ([place])    RCEA  by Dr Lemar Livings  . PUD (peptic ulcer disease)   . Vertigo     Past Surgical History:  Procedure Laterality Date  . AORTOGRAM  04/24/2016    Abdominal aortogram, bilateral iliac angiogram, bifemoral runoff  . CARDIAC CATHETERIZATION  05/12/2005   RCA  . carotid doppler  01/24/2013   RICA endarterectomy,left CCA 0-49%; left bulb and prox ICA 50-69%; bilaateral subclavian < 50%  . CAROTID ENDARTERECTOMY  11/01/2010  . CATARACT EXTRACTION    . CORONARY ANGIOPLASTY  05/18/2005   2 STENTS distal RCA AND PROXIMAL-MID RCA   5 total stents per pt.  . DOPPLER ECHOCARDIOGRAPHY  02/07/2012   EF 55%,SHOWED NO ISCHEMIA    . HERNIA REPAIR     umbilical  . lower arterial  doppler  02/04/2013   aotra 1.5 x 1.5 cm; distal abd aorta 70-99%,proximal common iliac arteries -very stenotic with increased velocities>50%,may be falsely elevated as a result of residual plaque from the distal aorta stenosis  . lower extremity doppler  June 18 ,2013   ABI'S ABNORMAL, RABI was 0.88 and LABI 0.75 ,with 3-vessel  run off  . NM MYOVIEW LTD  MAY [id number]   showed no significant ischemia;  . NM MYOVIEW LTD  04/22/2008   ef 77%,exercise capcity 6 METS ,exaggerated blood pressure response to exercise  . PERIPHERAL VASCULAR CATHETERIZATION N/A 04/24/2016   Procedure: Lower Extremity Angiography;  Surgeon: Lorretta Harp, MD;  Location: [facility];  Service: Cardiovascular;  Laterality: N/A;  . PERIPHERAL VASCULAR CATHETERIZATION  04/24/2016   Procedure: Peripheral Vascular Intervention;  Surgeon: Lorretta Harp, MD;  Location: [facility];  Service: Cardiovascular;;  Aorta  . retrograde central aortic catheterization  05/19/2005   cutting balloon atherectomy, c-circ stenosis with DES STENTING CYPHER  . TONSILLECTOMY    . TRANSURETHRAL RESECTION OF PROSTATE N/A 09/08/2016   Procedure: TRANSURETHRAL RESECTION OF THE PROSTATE (TURP);  Surgeon: Ardis Hughs, MD;  Location: [facility];  Service: Urology;  Laterality: N/A;  . TRANSURETHRAL RESECTION OF PROSTATE     10-10-17  Dr. Louis Meckel  . TRANSURETHRAL RESECTION OF PROSTATE N/A 10/10/2017   Procedure: TRANSURETHRAL RESECTION OF THE PROSTATE (TURP);  Surgeon: Ardis Hughs, MD;  Location: [facility];  Service: Urology;  Laterality: N/A;  . VASECTOMY      There were no vitals filed for this visit.   Subjective Assessment - 11/20/17 1454    Subjective  Pt returns to outpatient PT with reoccurence of dizziness and imbalance.   Pt reports having a TURP procedure for his prostate which caused an infection and pt had to stop doing strenuous activity and exercise.  Pt feels  that due to having to cease activities, he had a decline in balance and function.   Pt is not on antibiotics but the physician may place him on some if infection does not improve.    Pertinent History  AAA, CAD, HTN, PAD, and vertigo    Limitations  Walking    Patient Stated Goals  to regain his balance and gains he made before in therapy    Currently in Pain?  No/denies         [facility] PT Assessment - 11/20/17 1500      Assessment   Medical Diagnosis  imbalance and dizziness    Referring Provider  Ward Givens, NP; Kathrynn Ducking, MD    Onset Date/Surgical Date  11/12/17    Prior Therapy  yes vestibular rehab in fall of 2018      Precautions   Precautions  Other (comment)    Precaution Comments  AAA, CAD, HTN, PAD, and vertigo      Balance Screen   Has the patient fallen in the past 6 months  No    Has the patient had a decrease in activity level because of a fear of falling?   Yes    Is the patient reluctant to leave their home because of a fear of falling?   No      Home Environment   Living Environment  Private residence    Living Arrangements  Spouse/significant other    Type of [place] entry    [place]  None      Prior Function   Level of [place]      Observation/Other Assessments   Focus on Therapeutic Outcomes (FOTO)   Not indicated      Sensation   Light Touch  Appears Intact    Additional Comments  but reports extreme LE fatigue      ROM / Strength   AROM / PROM / Strength  Strength      Strength   Overall Strength  Deficits    Overall Strength Comments  5/5 bilat LE except 4-/5 hip flexors      Ambulation/Gait   Ambulation/Gait  Yes    Ambulation/Gait Assistance  5: Supervision    Ambulation Distance (Feet)  115 Feet    Assistive device  None    Gait Pattern  Decreased stride length;Decreased stance time - right;Step-through pattern;Decreased step  length - right;Decreased step length - left;Decreased hip/knee flexion - right;Decreased hip/knee flexion - left    Ambulation Surface  Level;Indoor      Standardized Balance Assessment   Standardized Balance Assessment  Berg Balance Test;10 meter walk test    10 Meter Walk  15.34 seconds or  2.13 ft/sec      Berg Balance Test   Sit to Stand  Able to stand  independently using hands    Standing Unsupported  Able to stand 2 minutes with supervision    Sitting with Back Unsupported but Feet Supported on Floor or Stool  Able to sit safely and securely 2 minutes    Stand to Sit  Sits safely with minimal use of hands    Transfers  Able to transfer safely, minor use of hands    Standing Unsupported with Eyes Closed  Able to stand 10 seconds with supervision    Standing Ubsupported with Feet Together  Able to place feet together independently and stand for 1 minute with supervision    From Standing, Reach Forward with Outstretched Arm  Reaches forward but needs supervision    From Standing Position, Pick up Object from [place] to pick up shoe, needs supervision    From Standing Position, Turn to Look Behind Over each Shoulder  Needs supervision when turning    Turn 360 Degrees  Able to turn 360 degrees safely but slowly    Standing Unsupported, Alternately Place Feet on Step/Stool  Able to complete 4 steps without aid or supervision    Standing Unsupported, One Foot in Front  Able to take small step independently and hold 30 seconds    Standing on One Leg  Tries to lift leg/unable to hold 3 seconds but remains standing independently    Total Score  36    Berg comment:  36/56           Vestibular Assessment - 11/20/17 1506      Vestibular Assessment   General Observation  Ambulates with very slow pace, denies changes in sensation, LE strength, changes in vision or hearing, denies nausea or vomiting, denies headaches or neck pain.      Symptom Behavior   Type of Dizziness  Imbalance     Frequency of Dizziness  daily    Duration of Dizziness  brief when he first gets out of bed    Aggravating Factors  Activity in general    Relieving Factors  Medication;Slow movements valium      Occulomotor Exam   Occulomotor Alignment  Abnormal Right eye slightly elevated compared to L    Spontaneous  Absent    Gaze-induced  Absent    Smooth Pursuits  Intact    Saccades  Poor trajectory;Slow    Comment  Convergence impaired      Vestibulo-Occular Reflex   VOR to Slow Head Movement  Normal    VOR Cancellation  Normal    Comment  Cover-cross cover test: + for R eye re-fixation downward; HIT: negative bilaterally today          Objective measurements completed on examination: See above findings.              PT Education - 11/20/17 2200    Education provided  Yes    Education Details  clinical findings; PT POC and goals; will need to clarify with urologist if activity restrictions remain    Person(s) Educated  Patient    Methods  Explanation    Comprehension  Verbalized understanding       PT Short Term Goals - 11/20/17 2208      PT SHORT TERM GOAL #1   Title  Pt will participate in FGA assessment    Time  4    Period  Weeks  Status  New    Target Date  12/20/17      PT SHORT TERM GOAL #2   Title  Pt will improve gait velocity to >/= 2.5 ft/sec with increase in stride length, R stance    Baseline  2.13 ft/sec    Time  4    Period  Weeks    Status  New    Target Date  12/20/17      PT SHORT TERM GOAL #3   Title  Pt will decrease falls risk as indicated by increase in BERG by 4 points    Baseline  36/56    Time  4    Period  Weeks    Status  New    Target Date  12/20/17        PT Long Term Goals - 11/20/17 2211      PT LONG TERM GOAL #1   Title  Pt will be independent with HEP and will transition back to community classes at [employer]    Time  8    Period  Weeks    Status  New    Target Date  01/19/18      PT LONG TERM GOAL #2    Title  Pt will improve FGA by 4 points from initial assessment    Baseline  TBD    Time  8    Period  Weeks    Status  New    Target Date  01/19/18      PT LONG TERM GOAL #3   Title  Pt will improve BERG by 8 points from initial assessment    Baseline  36/56 eval    Time  8    Period  Weeks    Status  New    Target Date  01/19/18      PT LONG TERM GOAL #4   Title  Pt will improve gait velocity to >/= 2.8 ft/sec     Time  8    Period  Weeks    Status  New    Target Date  01/19/18      PT LONG TERM GOAL #5   Title  Pt will improve LE strength to 5/5 bilaterally     Baseline  4-/5 hip flexion, RLE slightly decreased in strength to LLE    Time  8    Period  Weeks    Status  New    Target Date  01/19/18             Plan - 11/20/17 2201    Clinical Impression Statement  Pt is a 84 year old male referred to Neuro OPPT for evaluation of reoccurring dizziness and imbalance.  Pt's PMH is significant for the following: AAA, CAD, HTN, PAD, hematuria and vertigo. The following deficits were noted during pt's exam: disequilibrium, impaired LE strength, impaired oculomotor function, impaired balance and gait.  Pt's BERG score is significantly decreased from last episode of care and BERG score + decreased gait speed indicate pt is at risk for falls. Pt would benefit from skilled PT to address these impairments and functional limitations to maximize functional mobility independence and reduce falls risk.    History and Personal Factors relevant to plan of care:  AAA, CAD, HTN, PAD, hematuria and vertigo; recurrent episodes of imbalance and functional decline; may have activity restrictions after TURP    Clinical Presentation  Evolving    Clinical Presentation due to:  AAA, CAD, HTN, PAD, hematuria and  vertigo; recurrent episodes of imbalance and functional decline; may have activity restrictions after TURP    Clinical Decision Making  Moderate    Rehab Potential  Good    PT Frequency  2x  / week    PT Duration  8 weeks    PT Treatment/Interventions  ADLs/Self Care Home Management;Gait training;Stair training;Functional mobility training;Therapeutic activities;Therapeutic exercise;Balance training;Neuromuscular re-education;Patient/family education;Vestibular    PT Next Visit Plan  Assess FGA and reset LTG; initiate LE strength and balance HEP (will contact MD to see if any activity restrictions remain after TURP); corner balance/balance reaction/weight shifting - pt tends to lose his balance to R; impaired weight shift and SLS on R    Consulted and Agree with Plan of Care  Patient       Patient will benefit from skilled therapeutic intervention in order to improve the following deficits and impairments:  Abnormal gait, Decreased balance, Decreased strength, Difficulty walking, Dizziness  Visit Diagnosis: Dizziness and giddiness  Unsteadiness on feet  Other abnormalities of gait and mobility  Muscle weakness (generalized)     Problem List Patient Active Problem List   Diagnosis Date Noted  . Hematuria 10/10/2017  . Gait abnormality 02/13/2017  . UTI (urinary tract infection) 11/22/2016  . Vertigo 11/22/2016  . Hypokalemia 11/22/2016  . Urinary frequency 09/08/2016  . Claudication ([place]) 04/24/2016  . Bilateral carotid artery disease ([place]) 08/07/2014  . Peripheral arterial disease ([place]) 08/06/2013  . GASTROPARESIS 02/25/2009  . COLONIC POLYPS, ADENOMATOUS, HX OF 02/22/2009  . HYPERCHOLESTEROLEMIA 09/26/2007  . Essential hypertension 09/26/2007  . Coronary atherosclerosis 09/26/2007  . ALLERGIC RHINITIS 09/26/2007  . GERD 09/26/2007  . Peptic ulcer 09/26/2007  . UMBILICAL HERNIA 08/30/4386  . NEPHROLITHIASIS 09/26/2007  . BLADDER STONE 09/26/2007    Rico Junker, PT, DPT 11/20/17    10:17 PM    [facility] [address] [place], [place], [address] Phone: [phone]   Fax:   [phone]  Name: THEDORE PICKEL MRN: [id number] Date of Birth: [birth year]

## 2017-11-28 ENCOUNTER — Encounter: Payer: Self-pay | Admitting: Rehabilitative and Restorative Service Providers"

## 2017-11-28 ENCOUNTER — Ambulatory Visit: Payer: Medicare Other | Attending: Adult Health | Admitting: Rehabilitative and Restorative Service Providers"

## 2017-11-28 DIAGNOSIS — M6281 Muscle weakness (generalized): Secondary | ICD-10-CM

## 2017-11-28 DIAGNOSIS — R42 Dizziness and giddiness: Secondary | ICD-10-CM

## 2017-11-28 DIAGNOSIS — R2681 Unsteadiness on feet: Secondary | ICD-10-CM

## 2017-11-28 DIAGNOSIS — R2689 Other abnormalities of gait and mobility: Secondary | ICD-10-CM

## 2017-11-28 NOTE — Patient Instructions (Signed)
Functional Quadriceps: Sit to Stand    Sit on edge of chair, feet flat on floor. Stand upright, extending knees fully. Repeat _10___ times per set. Do ___2_ sets per session. Do __1-2__ sessions per day.  http://orth.exer.us/734   Copyright  VHI. All rights reserved.    Feet Partial Heel-Toe, Varied Arm Positions - Eyes Closed    Stand with right foot partially in front of the other and arms AT YOUR SIDE. Close eyes and visualize upright position. Hold _30___ seconds. Repeat __3__ times per session. Do __1-2__ sessions per day.  Copyright  VHI. All rights reserved.   SINGLE LIMB STANCE    Stance: single leg on floor. Raise leg. Hold _10-15__ seconds. Repeat with other leg. _3__ reps per set, 2 times/day. STAND NEAR A COUNTERTOP FOR SUPPORT.  Copyright  VHI. All rights reserved.  Heel Raise: Bilateral (Standing)    Rise on balls of feet.  Hold 3 seconds. Repeat __20__ times per set. Do _1___ sets per session. Do _2___ sessions per day.  http://orth.exer.us/38   Copyright  VHI. All rights reserved.   ANKLE: Dorsiflexion - Standing    Stand with upright posture. Raise toes of both feet up at same time. _20__ reps per set, _2__ sets per day. Copyright  VHI. All rights reserved.          Electronically signed by Mervyn Gay, PT at 03/09/2017 3:40 PM

## 2017-11-28 NOTE — Therapy (Signed)
[facility] [address] [place], [place], [address] Phone: [phone]   Fax:  [phone]  Physical Therapy Treatment  Patient Details  Name: Nathan Santiago MRN: [id number] Date of Birth: 12-09-1930 Referring Provider: Ward Givens, NP; Kathrynn Ducking, MD   Encounter Date: 11/28/2017  PT End of Session - 11/28/17 1231    Visit Number  2    Number of Visits  17    Date for PT Re-Evaluation  01/19/18    Authorization Type  Medicare, AARP    PT Start Time  1148    PT Stop Time  1230    PT Time Calculation (min)  42 min    Activity Tolerance  Patient tolerated treatment well    Behavior During Therapy  [facility] for tasks assessed/performed       Past Medical History:  Diagnosis Date  . AAA (abdominal aortic aneurysm) (HCC)    asymptomatic  . AAA (abdominal aortic aneurysm) ([place]) 2010   peripheral  angiogram-- bilateral SFA DISEASE  and 60 to 70% infrarenaal abd. aortic stenosis with 15 -mm gradient  . Adenomatous colon polyp 11/2003  . CAD (coronary artery disease)   . Gait abnormality 02/13/2017  . Gastroparesis    pt denies  . GERD (gastroesophageal reflux disease)    w/ LPR  . History of kidney stones    x2  . Hyperlipidemia   . Hypertension   . Peripheral arterial disease ([place])    RCEA  by Dr Lemar Livings  . PUD (peptic ulcer disease)   . Vertigo     Past Surgical History:  Procedure Laterality Date  . AORTOGRAM  04/24/2016    Abdominal aortogram, bilateral iliac angiogram, bifemoral runoff  . CARDIAC CATHETERIZATION  05/12/2005   RCA  . carotid doppler  01/24/2013   RICA endarterectomy,left CCA 0-49%; left bulb and prox ICA 50-69%; bilaateral subclavian < 50%  . CAROTID ENDARTERECTOMY  11/01/2010  . CATARACT EXTRACTION    . CORONARY ANGIOPLASTY  05/18/2005   2 STENTS distal RCA AND PROXIMAL-MID RCA   5 total stents per pt.  . DOPPLER ECHOCARDIOGRAPHY  02/07/2012   EF 55%,SHOWED NO ISCHEMIA    . HERNIA REPAIR     umbilical  . lower arterial  doppler  02/04/2013   aotra 1.5 x 1.5 cm; distal abd aorta 70-99%,proximal common iliac arteries -very stenotic with increased velocities>50%,may be falsely elevated as a result of residual plaque from the distal aorta stenosis  . lower extremity doppler  June 18 ,2013   ABI'S ABNORMAL, RABI was 0.88 and LABI 0.75 ,with 3-vessel  run off  . NM MYOVIEW LTD  MAY [id number]   showed no significant ischemia;  . NM MYOVIEW LTD  04/22/2008   ef 77%,exercise capcity 6 METS ,exaggerated blood pressure response to exercise  . PERIPHERAL VASCULAR CATHETERIZATION N/A 04/24/2016   Procedure: Lower Extremity Angiography;  Surgeon: Lorretta Harp, MD;  Location: [facility];  Service: Cardiovascular;  Laterality: N/A;  . PERIPHERAL VASCULAR CATHETERIZATION  04/24/2016   Procedure: Peripheral Vascular Intervention;  Surgeon: Lorretta Harp, MD;  Location: [facility];  Service: Cardiovascular;;  Aorta  . retrograde central aortic catheterization  05/19/2005   cutting balloon atherectomy, c-circ stenosis with DES STENTING CYPHER  . TONSILLECTOMY    . TRANSURETHRAL RESECTION OF PROSTATE N/A 09/08/2016   Procedure: TRANSURETHRAL RESECTION OF THE PROSTATE (TURP);  Surgeon: Ardis Hughs, MD;  Location: [facility];  Service: Urology;  Laterality: N/A;  . TRANSURETHRAL RESECTION OF PROSTATE     10-10-17  Dr. Louis Meckel  . TRANSURETHRAL RESECTION OF PROSTATE N/A 10/10/2017   Procedure: TRANSURETHRAL RESECTION OF THE PROSTATE (TURP);  Surgeon: Ardis Hughs, MD;  Location: [facility];  Service: Urology;  Laterality: N/A;  . VASECTOMY      There were no vitals filed for this visit.  Subjective Assessment - 11/28/17 1151    Subjective  The patient reports he is so fatigued in the morning.  He notes tired all day "I just as soon take a nap.".    Pertinent History  AAA, CAD, HTN, PAD, and vertigo    Patient Stated Goals  to regain his balance and gains  he made before in therapy    Currently in Pain?  No/denies         [facility] PT Assessment - 11/28/17 1156      Functional Gait  Assessment   Gait assessed   Yes    Gait Level Surface  Walks 20 ft in less than 7 sec but greater than 5.5 sec, uses assistive device, slower speed, mild gait deviations, or deviates 6-10 in outside of the 12 in walkway width.    Change in Gait Speed  Makes only minor adjustments to walking speed, or accomplishes a change in speed with significant gait deviations, deviates 10-15 in outside the 12 in walkway width, or changes speed but loses balance but is able to recover and continue walking.    Gait with Horizontal Head Turns  Performs head turns with moderate changes in gait velocity, slows down, deviates 10-15 in outside 12 in walkway width but recovers, can continue to walk.    Gait with Vertical Head Turns  Performs task with moderate change in gait velocity, slows down, deviates 10-15 in outside 12 in walkway width but recovers, can continue to walk.    Gait and Pivot Turn  Turns slowly, requires verbal cueing, or requires several small steps to catch balance following turn and stop    Step Over Obstacle  Is able to step over one shoe box (4.5 in total height) without changing gait speed. No evidence of imbalance.    Gait with Narrow Base of Support  Ambulates less than 4 steps heel to toe or cannot perform without assistance.    Gait with Eyes Closed  Walks 20 ft, slow speed, abnormal gait pattern, evidence for imbalance, deviates 10-15 in outside 12 in walkway width. Requires more than 9 sec to ambulate 20 ft.    Ambulating Backwards  Walks 20 ft, slow speed, abnormal gait pattern, evidence for imbalance, deviates 10-15 in outside 12 in walkway width.    Steps  Two feet to a stair, must use rail.    Total Score  11    FGA comment:  11/30 (was at 23/30 at d/c in fall 2018)                   [facility] Adult PT Treatment/Exercise - 11/28/17 1204       Ambulation/Gait   Ambulation/Gait  Yes    Ambulation/Gait Assistance  5: Supervision    Ambulation Distance (Feet)  400 Feet    Assistive device  None    Ambulation Surface  Level;Indoor    Gait Comments  Cues for gait to improve stride length, focus on bilateral heel strike and tactile cues for arm swing.        Neuro Re-ed    Neuro Re-ed Details  Established HEP for balance: single leg stance, partial heel/toe with eyes closed.  Also performed sidestepping near countertop with intermittent UE support.      Exercises   Exercises  Other Exercises    Other Exercises   Established HEP for strength including heel raises, toe raises, sit<>stand             PT Education - 11/28/17 1218    Education provided  Yes    Education Details  HEP:  heel raises, toe raises, sit<>stand, single limb stance, partial heel toe with eyes closed    Person(s) Educated  Patient    Methods  Explanation;Demonstration;Handout    Comprehension  Verbalized understanding;Returned demonstration       PT Short Term Goals - 11/28/17 2050      PT SHORT TERM GOAL #1   Title  Pt will participate in FGA assessment    Baseline  FGA=11/30    Time  4    Period  Weeks    Status  Achieved      PT SHORT TERM GOAL #2   Title  Pt will improve gait velocity to >/= 2.5 ft/sec with increase in stride length, R stance    Baseline  2.13 ft/sec    Time  4    Period  Weeks    Status  On-going      PT SHORT TERM GOAL #3   Title  Pt will decrease falls risk as indicated by increase in BERG by 4 points    Baseline  36/56    Time  4    Period  Weeks    Status  On-going        PT Long Term Goals - 11/28/17 2050      PT LONG TERM GOAL #1   Title  Pt will be independent with HEP and will transition back to community classes at [employer]    Time  8    Period  Weeks    Status  New      PT LONG TERM GOAL #2   Title  Pt will improve FGA by 4 points from initial assessment    Baseline  11/30 baseline on  11/28/2017    Time  8    Period  Weeks    Status  New      PT LONG TERM GOAL #3   Title  Pt will improve BERG by 8 points from initial assessment    Baseline  36/56 eval    Time  8    Period  Weeks    Status  New      PT LONG TERM GOAL #4   Title  Pt will improve gait velocity to >/= 2.8 ft/sec     Time  8    Period  Weeks    Status  New      PT LONG TERM GOAL #5   Title  Pt will improve LE strength to 5/5 bilaterally     Baseline  4-/5 hip flexion, RLE slightly decreased in strength to LLE    Time  8    Period  Weeks    Status  New            Plan - 11/28/17 2049    Clinical Impression Statement  The patient tolerated HEP well.  He scored 11/30 on FGA indicating high fall risk. PT to continue working to STGs/LTGs.    PT Treatment/Interventions  ADLs/Self Care Home Management;Gait training;Stair training;Functional mobility training;Therapeutic activities;Therapeutic  exercise;Balance training;Neuromuscular re-education;Patient/family education;Vestibular    PT Next Visit Plan  Check HEP, focus on R LE weight shift/single leg stance, dynamic balance and gait activities    Consulted and Agree with Plan of Care  Patient       Patient will benefit from skilled therapeutic intervention in order to improve the following deficits and impairments:  Abnormal gait, Decreased balance, Decreased strength, Difficulty walking, Dizziness  Visit Diagnosis: Dizziness and giddiness  Unsteadiness on feet  Other abnormalities of gait and mobility  Muscle weakness (generalized)     Problem List Patient Active Problem List   Diagnosis Date Noted  . Hematuria 10/10/2017  . Gait abnormality 02/13/2017  . UTI (urinary tract infection) 11/22/2016  . Vertigo 11/22/2016  . Hypokalemia 11/22/2016  . Urinary frequency 09/08/2016  . Claudication ([place]) 04/24/2016  . Bilateral carotid artery disease ([place]) 08/07/2014  . Peripheral arterial disease ([place]) 08/06/2013  . GASTROPARESIS  02/25/2009  . COLONIC POLYPS, ADENOMATOUS, HX OF 02/22/2009  . HYPERCHOLESTEROLEMIA 09/26/2007  . Essential hypertension 09/26/2007  . Coronary atherosclerosis 09/26/2007  . ALLERGIC RHINITIS 09/26/2007  . GERD 09/26/2007  . Peptic ulcer 09/26/2007  . UMBILICAL HERNIA 71/16/5790  . NEPHROLITHIASIS 09/26/2007  . BLADDER STONE 09/26/2007    Nirvi Boehler , PT 11/28/2017, 9:08 PM  [facility] [address] [place], [place], [address] Phone: [phone]   Fax:  [phone]  Name: Nathan Santiago MRN: [id number] Date of Birth: May 08, 1931

## 2017-11-30 ENCOUNTER — Encounter: Payer: Self-pay | Admitting: Physical Therapy

## 2017-11-30 ENCOUNTER — Ambulatory Visit: Payer: Medicare Other | Admitting: Physical Therapy

## 2017-11-30 DIAGNOSIS — R42 Dizziness and giddiness: Secondary | ICD-10-CM | POA: Diagnosis not present

## 2017-11-30 DIAGNOSIS — M6281 Muscle weakness (generalized): Secondary | ICD-10-CM

## 2017-11-30 DIAGNOSIS — R2681 Unsteadiness on feet: Secondary | ICD-10-CM | POA: Diagnosis not present

## 2017-11-30 DIAGNOSIS — R2689 Other abnormalities of gait and mobility: Secondary | ICD-10-CM

## 2017-12-01 NOTE — Therapy (Signed)
[facility] [address] [place], [place], [address] Phone: [phone]   Fax:  [phone]  Physical Therapy Treatment  Patient Details  Name: Nathan Santiago MRN: [id number] Date of Birth: 10-11-1930 Referring Provider: Ward Givens, NP; Kathrynn Ducking, MD   Encounter Date: 11/30/2017  PT End of Session - 11/30/17 1412    Visit Number  3    Number of Visits  17    Date for PT Re-Evaluation  01/19/18    Authorization Type  Medicare, AARP    PT Start Time  1410 pt late for appt today    PT Stop Time  1445    PT Time Calculation (min)  35 min    Equipment Utilized During Treatment  Gait belt    Activity Tolerance  Patient tolerated treatment well;No increased pain;Patient limited by fatigue    Behavior During Therapy  [facility] for tasks assessed/performed       Past Medical History:  Diagnosis Date  . AAA (abdominal aortic aneurysm) (HCC)    asymptomatic  . AAA (abdominal aortic aneurysm) ([place]) 2010   peripheral  angiogram-- bilateral SFA DISEASE  and 60 to 70% infrarenaal abd. aortic stenosis with 15 -mm gradient  . Adenomatous colon polyp 11/2003  . CAD (coronary artery disease)   . Gait abnormality 02/13/2017  . Gastroparesis    pt denies  . GERD (gastroesophageal reflux disease)    w/ LPR  . History of kidney stones    x2  . Hyperlipidemia   . Hypertension   . Peripheral arterial disease ([place])    RCEA  by Dr Lemar Livings  . PUD (peptic ulcer disease)   . Vertigo     Past Surgical History:  Procedure Laterality Date  . AORTOGRAM  04/24/2016    Abdominal aortogram, bilateral iliac angiogram, bifemoral runoff  . CARDIAC CATHETERIZATION  05/12/2005   RCA  . carotid doppler  01/24/2013   RICA endarterectomy,left CCA 0-49%; left bulb and prox ICA 50-69%; bilaateral subclavian < 50%  . CAROTID ENDARTERECTOMY  11/01/2010  . CATARACT EXTRACTION    . CORONARY ANGIOPLASTY  05/18/2005   2 STENTS distal RCA  AND PROXIMAL-MID RCA   5 total stents per pt.  . DOPPLER ECHOCARDIOGRAPHY  02/07/2012   EF 55%,SHOWED NO ISCHEMIA   . HERNIA REPAIR     umbilical  . lower arterial  doppler  02/04/2013   aotra 1.5 x 1.5 cm; distal abd aorta 70-99%,proximal common iliac arteries -very stenotic with increased velocities>50%,may be falsely elevated as a result of residual plaque from the distal aorta stenosis  . lower extremity doppler  June 18 ,2013   ABI'S ABNORMAL, RABI was 0.88 and LABI 0.75 ,with 3-vessel  run off  . NM MYOVIEW LTD  MAY [id number]   showed no significant ischemia;  . NM MYOVIEW LTD  04/22/2008   ef 77%,exercise capcity 6 METS ,exaggerated blood pressure response to exercise  . PERIPHERAL VASCULAR CATHETERIZATION N/A 04/24/2016   Procedure: Lower Extremity Angiography;  Surgeon: Lorretta Harp, MD;  Location: [facility];  Service: Cardiovascular;  Laterality: N/A;  . PERIPHERAL VASCULAR CATHETERIZATION  04/24/2016   Procedure: Peripheral Vascular Intervention;  Surgeon: Lorretta Harp, MD;  Location: [facility];  Service: Cardiovascular;;  Aorta  . retrograde central aortic catheterization  05/19/2005   cutting balloon atherectomy, c-circ stenosis with DES STENTING CYPHER  . TONSILLECTOMY    . TRANSURETHRAL RESECTION OF PROSTATE N/A 09/08/2016  Procedure: TRANSURETHRAL RESECTION OF THE PROSTATE (TURP);  Surgeon: Ardis Hughs, MD;  [facility];  Service: Urology;  Laterality: N/A;  . TRANSURETHRAL RESECTION OF PROSTATE     10-10-17  Dr. Louis Meckel  . TRANSURETHRAL RESECTION OF PROSTATE N/A 10/10/2017   Procedure: TRANSURETHRAL RESECTION OF THE PROSTATE (TURP);  Surgeon: Ardis Hughs, MD;  [facility];  Service: Urology;  Laterality: N/A;  . VASECTOMY      There were no vitals filed for this visit.  Subjective Assessment - 11/30/17 1411    Subjective  No new complaints. No falls or pain to report.     Pertinent History  AAA, CAD, HTN, PAD, and vertigo     Limitations  Walking    Patient Stated Goals  to regain his balance and gains he made before in therapy    Currently in Pain?  No/denies         [facility] Adult PT Treatment/Exercise - 11/30/17 1429      Ambulation/Gait   Ambulation/Gait  Yes    Ambulation/Gait Assistance  5: Supervision    Ambulation Distance (Feet)  -- around gym    Assistive device  None    Gait Pattern  Decreased stride length;Decreased stance time - right;Step-through pattern;Decreased step length - right;Decreased step length - left;Decreased hip/knee flexion - right;Decreased hip/knee flexion - left    Ambulation Surface  Level;Indoor      High Level Balance   High Level Balance Activities  Tandem walking;Marching forwards;Marching backwards tandem/toe/heel walking    High Level Balance Comments  on red mats next to counter with UE support as needed for balance: 3 laps each with min guard to min assist, cues on posture and ex form.           Balance Exercises - 11/30/17 1413      Balance Exercises: Standing   Rockerboard  Anterior/posterior;Lateral;Head turns;EO;EC;30 seconds;10 reps    Other Standing Exercises  seated with feet across red beam: sit<>stands x 10 reps with emphasis on tall posture and slow, controlled descent. min guard assist for balance.       Balance Exercises: Standing   Rebounder Limitations  performed both ways on balance board with no UE support/occasional touch for balance: Rocking board with emphasis on tall posture with EO, progressing to EC; holding board steady for EC no head movements, progressing to EC head movements left<>right, up<>down. Min assist at times needed with cues on posture and weight shifting to assist with balance corrections.                          PT Short Term Goals - 11/28/17 2050      PT SHORT TERM GOAL #1   Title  Pt will participate in FGA assessment    Baseline  FGA=11/30    Time  4    Period  Weeks    Status  Achieved      PT SHORT TERM GOAL  #2   Title  Pt will improve gait velocity to >/= 2.5 ft/sec with increase in stride length, R stance    Baseline  2.13 ft/sec    Time  4    Period  Weeks    Status  On-going      PT SHORT TERM GOAL #3   Title  Pt will decrease falls risk as indicated by increase in BERG by 4 points    Baseline  36/56    Time  4    Period  Weeks    Status  On-going        PT Long Term Goals - 11/28/17 2050      PT LONG TERM GOAL #1   Title  Pt will be independent with HEP and will transition back to community classes at [employer]    Time  8    Period  Weeks    Status  New      PT LONG TERM GOAL #2   Title  Pt will improve FGA by 4 points from initial assessment    Baseline  11/30 baseline on 11/28/2017    Time  8    Period  Weeks    Status  New      PT LONG TERM GOAL #3   Title  Pt will improve BERG by 8 points from initial assessment    Baseline  36/56 eval    Time  8    Period  Weeks    Status  New      PT LONG TERM GOAL #4   Title  Pt will improve gait velocity to >/= 2.8 ft/sec     Time  8    Period  Weeks    Status  New      PT LONG TERM GOAL #5   Title  Pt will improve LE strength to 5/5 bilaterally     Baseline  4-/5 hip flexion, RLE slightly decreased in strength to LLE    Time  8    Period  Weeks    Status  New            Plan - 11/30/17 1412    Clinical Impression Statement  Today's skilled session continued to focus on LE strengthening and balance with pt continuing to need short rest breaks due to fatigue. No other issues reported. Pt is progressing toward goals and should benefit from continued PT to progress toward unmet goals.     PT Treatment/Interventions  ADLs/Self Care Home Management;Gait training;Stair training;Functional mobility training;Therapeutic activities;Therapeutic exercise;Balance training;Neuromuscular re-education;Patient/family education;Vestibular    PT Next Visit Plan  focus on R LE weight shift/single leg stance, dynamic balance and gait  activities    Consulted and Agree with Plan of Care  Patient       Patient will benefit from skilled therapeutic intervention in order to improve the following deficits and impairments:  Abnormal gait, Decreased balance, Decreased strength, Difficulty walking, Dizziness  Visit Diagnosis: Dizziness and giddiness  Unsteadiness on feet  Other abnormalities of gait and mobility  Muscle weakness (generalized)     Problem List Patient Active Problem List   Diagnosis Date Noted  . Hematuria 10/10/2017  . Gait abnormality 02/13/2017  . UTI (urinary tract infection) 11/22/2016  . Vertigo 11/22/2016  . Hypokalemia 11/22/2016  . Urinary frequency 09/08/2016  . Claudication ([place]) 04/24/2016  . Bilateral carotid artery disease ([place]) 08/07/2014  . Peripheral arterial disease ([place]) 08/06/2013  . GASTROPARESIS 02/25/2009  . COLONIC POLYPS, ADENOMATOUS, HX OF 02/22/2009  . HYPERCHOLESTEROLEMIA 09/26/2007  . Essential hypertension 09/26/2007  . Coronary atherosclerosis 09/26/2007  . ALLERGIC RHINITIS 09/26/2007  . GERD 09/26/2007  . Peptic ulcer 09/26/2007  . UMBILICAL HERNIA 49/70/2637  . NEPHROLITHIASIS 09/26/2007  . BLADDER STONE 09/26/2007   Willow Ora, PTA, [facility] [address] [place], [place] [address] [phone] 12/01/17, 5:56 PM  Name: Nathan Santiago MRN: [id number] Date of Birth: [birth year]

## 2017-12-05 ENCOUNTER — Ambulatory Visit: Payer: Medicare Other | Admitting: Physical Therapy

## 2017-12-05 ENCOUNTER — Encounter: Payer: Self-pay | Admitting: Physical Therapy

## 2017-12-05 DIAGNOSIS — M6281 Muscle weakness (generalized): Secondary | ICD-10-CM | POA: Diagnosis not present

## 2017-12-05 DIAGNOSIS — R2689 Other abnormalities of gait and mobility: Secondary | ICD-10-CM | POA: Diagnosis not present

## 2017-12-05 DIAGNOSIS — R2681 Unsteadiness on feet: Secondary | ICD-10-CM | POA: Diagnosis not present

## 2017-12-05 DIAGNOSIS — R42 Dizziness and giddiness: Secondary | ICD-10-CM | POA: Diagnosis not present

## 2017-12-06 NOTE — Therapy (Signed)
[place] [address] [place], [place], [address] Phone: [phone]   Fax:  [phone]  Physical Therapy Treatment  Patient Details  Name: Nathan Santiago MRN: [id number] Date of Birth: [birth year] Referring Provider: Ward Givens, NP; Kathrynn Ducking, MD   Encounter Date: 12/05/2017     12/05/17 1155  PT Visits / Re-Eval  Visit Number 4  Number of Visits 17  Date for PT Re-Evaluation 01/19/18  Authorization  Authorization Type Medicare, AARP  PT Time Calculation  PT Start Time 1150  PT Stop Time 1228  PT Time Calculation (min) 38 min  PT - End of Session  Equipment Utilized During Treatment Gait belt  Activity Tolerance Patient tolerated treatment well;No increased pain;Patient limited by fatigue  Behavior During Therapy [facility] for tasks assessed/performed    Past Medical History:  Diagnosis Date  . AAA (abdominal aortic aneurysm) (HCC)    asymptomatic  . AAA (abdominal aortic aneurysm) ([place]) 2010   peripheral  angiogram-- bilateral SFA DISEASE  and 60 to 70% infrarenaal abd. aortic stenosis with 15 -mm gradient  . Adenomatous colon polyp 11/2003  . CAD (coronary artery disease)   . Gait abnormality 02/13/2017  . Gastroparesis    pt denies  . GERD (gastroesophageal reflux disease)    w/ LPR  . History of kidney stones    x2  . Hyperlipidemia   . Hypertension   . Peripheral arterial disease ([place])    RCEA  by Dr Lemar Livings  . PUD (peptic ulcer disease)   . Vertigo     Past Surgical History:  Procedure Laterality Date  . AORTOGRAM  04/24/2016    Abdominal aortogram, bilateral iliac angiogram, bifemoral runoff  . CARDIAC CATHETERIZATION  05/12/2005   RCA  . carotid doppler  01/24/2013   RICA endarterectomy,left CCA 0-49%; left bulb and prox ICA 50-69%; bilaateral subclavian < 50%  . CAROTID ENDARTERECTOMY  11/01/2010  . CATARACT EXTRACTION    . CORONARY ANGIOPLASTY  05/18/2005   2 STENTS  distal RCA AND PROXIMAL-MID RCA   5 total stents per pt.  . DOPPLER ECHOCARDIOGRAPHY  02/07/2012   EF 55%,SHOWED NO ISCHEMIA   . HERNIA REPAIR     umbilical  . lower arterial  doppler  02/04/2013   aotra 1.5 x 1.5 cm; distal abd aorta 70-99%,proximal common iliac arteries -very stenotic with increased velocities>50%,may be falsely elevated as a result of residual plaque from the distal aorta stenosis  . lower extremity doppler  June 18 ,2013   ABI'S ABNORMAL, RABI was 0.88 and LABI 0.75 ,with 3-vessel  run off  . NM MYOVIEW LTD  MAY [id number]   showed no significant ischemia;  . NM MYOVIEW LTD  04/22/2008   ef 77%,exercise capcity 6 METS ,exaggerated blood pressure response to exercise  . PERIPHERAL VASCULAR CATHETERIZATION N/A 04/24/2016   Procedure: Lower Extremity Angiography;  Surgeon: Lorretta Harp, MD;  Location: [facility];  Service: Cardiovascular;  Laterality: N/A;  . PERIPHERAL VASCULAR CATHETERIZATION  04/24/2016   Procedure: Peripheral Vascular Intervention;  Surgeon: Lorretta Harp, MD;  Location: [facility];  Service: Cardiovascular;;  Aorta  . retrograde central aortic catheterization  05/19/2005   cutting balloon atherectomy, c-circ stenosis with DES STENTING CYPHER  . TONSILLECTOMY    . TRANSURETHRAL RESECTION OF PROSTATE N/A 09/08/2016   Procedure: TRANSURETHRAL RESECTION OF THE PROSTATE (TURP);  Surgeon: Ardis Hughs, MD;  Location: [facility];  Service: Urology;  Laterality: N/A;  .  TRANSURETHRAL RESECTION OF PROSTATE     10-10-17  Dr. Louis Meckel  . TRANSURETHRAL RESECTION OF PROSTATE N/A 10/10/2017   Procedure: TRANSURETHRAL RESECTION OF THE PROSTATE (TURP);  Surgeon: Ardis Hughs, MD;  Location: [facility];  Service: Urology;  Laterality: N/A;  . VASECTOMY      There were no vitals filed for this visit.     12/05/17 1153  Symptoms/Limitations  Subjective No new complaints. No falls. Reports chronic low back pain, worse in mornings and with  activity. Plans to call Dr. Veverly Fells about it today.   Pertinent History AAA, CAD, HTN, PAD, and vertigo  Limitations Walking  Patient Stated Goals to regain his balance and gains he made before in therapy  Pain Assessment  Currently in Pain? Yes  Pain Score 3  Pain Location Back  Pain Orientation Lower  Pain Descriptors / Indicators Aching;Sharp  Pain Type Chronic pain  Pain Onset More than a month ago  Pain Frequency Intermittent  Aggravating Factors  increased activity  Pain Relieving Factors rest (lying down), medication      12/05/17 1214  High Level Balance  High Level Balance Activities Marching forwards;Marching backwards;Other (comment) (toe walking/heel walking both fwd/bwd)  High Level Balance Comments on red mats next to counter with UE support as needed for balance: 3 laps each with min guard to min assist, cues on posture and ex form.      12/05/17 1156  Balance Exercises: Standing  Standing Eyes Closed Wide (BOA);Head turns;Foam/compliant surface;Other reps (comment);30 secs;Limitations  Balance Beam standing across blue foam beam: alternating forward stepping to floor/back onto foam beam, then alternating backward stepping to floor/back onto beam. min guard to min assist for balance with intermittent touch to bars, cues on step length, step height and weight shiftring  Tandem Gait Forward;Retro;Intermittent upper extremity support;Foam/compliant surface;3 reps  Sidestepping Foam/compliant support;3 reps;Limitations  Step Over Hurdles / Cones hurdles of varied heights on both red mat next to counter: reciprocal stepping over them x 6 laps with light touch to counter for balance, min gurad to min assist for balance. cues for weight shifting and increased step height/length to clear hurdles  Balance Exercises: Standing  Standing Eyes Closed Limitations on folded dense blue foam in corner with chair in front for safety:   Sidestepping Limitations on blue foam beam with  light to no UE support x 3 laps each direction  Tandem Gait Limitations on blue foam beam with light to no UE support for balance, cues on posture and step placement       PT Short Term Goals - 11/28/17 2050      PT SHORT TERM GOAL #1   Title  Pt will participate in FGA assessment    Baseline  FGA=11/30    Time  4    Period  Weeks    Status  Achieved      PT SHORT TERM GOAL #2   Title  Pt will improve gait velocity to >/= 2.5 ft/sec with increase in stride length, R stance    Baseline  2.13 ft/sec    Time  4    Period  Weeks    Status  On-going      PT SHORT TERM GOAL #3   Title  Pt will decrease falls risk as indicated by increase in BERG by 4 points    Baseline  36/56    Time  4    Period  Weeks    Status  On-going  PT Long Term Goals - 11/28/17 2050      PT LONG TERM GOAL #1   Title  Pt will be independent with HEP and will transition back to community classes at [employer]    Time  8    Period  Weeks    Status  New      PT LONG TERM GOAL #2   Title  Pt will improve FGA by 4 points from initial assessment    Baseline  11/30 baseline on 11/28/2017    Time  8    Period  Weeks    Status  New      PT LONG TERM GOAL #3   Title  Pt will improve BERG by 8 points from initial assessment    Baseline  36/56 eval    Time  8    Period  Weeks    Status  New      PT LONG TERM GOAL #4   Title  Pt will improve gait velocity to >/= 2.8 ft/sec     Time  8    Period  Weeks    Status  New      PT LONG TERM GOAL #5   Title  Pt will improve LE strength to 5/5 bilaterally     Baseline  4-/5 hip flexion, RLE slightly decreased in strength to LLE    Time  8    Period  Weeks    Status  New         12/05/17 1155  Plan  Clinical Impression Statement Todays skilled session continued to focus on high level balance reactions and balance with vision removed with pt still needing support for balance. Pt is progressing with less overall assistance needed and should  benefit from continued PT to progress toward unmet goals.                    Pt will benefit from skilled therapeutic intervention in order to improve on the following deficits Abnormal gait;Decreased balance;Decreased strength;Difficulty walking;Dizziness  PT Treatment/Interventions ADLs/Self Care Home Management;Gait training;Stair training;Functional mobility training;Therapeutic activities;Therapeutic exercise;Balance training;Neuromuscular re-education;Patient/family education;Vestibular  PT Next Visit Plan focus on R LE weight shift/single leg stance, dynamic balance and gait activities  Consulted and Agree with Plan of Care Patient      Patient will benefit from skilled therapeutic intervention in order to improve the following deficits and impairments:  Abnormal gait, Decreased balance, Decreased strength, Difficulty walking, Dizziness  Visit Diagnosis: Dizziness and giddiness  Unsteadiness on feet  Other abnormalities of gait and mobility  Muscle weakness (generalized)     Problem List Patient Active Problem List   Diagnosis Date Noted  . Hematuria 10/10/2017  . Gait abnormality 02/13/2017  . UTI (urinary tract infection) 11/22/2016  . Vertigo 11/22/2016  . Hypokalemia 11/22/2016  . Urinary frequency 09/08/2016  . Claudication ([place]) 04/24/2016  . Bilateral carotid artery disease ([place]) 08/07/2014  . Peripheral arterial disease ([place]) 08/06/2013  . GASTROPARESIS 02/25/2009  . COLONIC POLYPS, ADENOMATOUS, HX OF 02/22/2009  . HYPERCHOLESTEROLEMIA 09/26/2007  . Essential hypertension 09/26/2007  . Coronary atherosclerosis 09/26/2007  . ALLERGIC RHINITIS 09/26/2007  . GERD 09/26/2007  . Peptic ulcer 09/26/2007  . UMBILICAL HERNIA 47/65/4650  . NEPHROLITHIASIS 09/26/2007  . BLADDER STONE 09/26/2007    Willow Ora, PTA, [place] [address] [place], [place] [address] [phone] 12/06/17, 10:08 PM   Name: Nathan Santiago MRN: [id number] Date of Birth: Dec 18, 1930

## 2017-12-07 ENCOUNTER — Encounter: Payer: Self-pay | Admitting: Physical Therapy

## 2017-12-07 ENCOUNTER — Ambulatory Visit: Payer: Medicare Other | Admitting: Physical Therapy

## 2017-12-07 DIAGNOSIS — R2681 Unsteadiness on feet: Secondary | ICD-10-CM

## 2017-12-07 DIAGNOSIS — R2689 Other abnormalities of gait and mobility: Secondary | ICD-10-CM | POA: Diagnosis not present

## 2017-12-07 DIAGNOSIS — M6281 Muscle weakness (generalized): Secondary | ICD-10-CM

## 2017-12-07 DIAGNOSIS — R42 Dizziness and giddiness: Secondary | ICD-10-CM | POA: Diagnosis not present

## 2017-12-07 NOTE — Therapy (Signed)
[place] [address] [place], [place], [address] Phone: [phone]   Fax:  [phone]  Physical Therapy Treatment  Patient Details  Name: Nathan Santiago MRN: [id number] Date of Birth: 10/28/1930 Referring Provider: Ward Givens, NP; Kathrynn Ducking, MD   Encounter Date: 12/07/2017  PT End of Session - 12/07/17 1346    Visit Number  5    Number of Visits  17    Date for PT Re-Evaluation  01/19/18    Authorization Type  Medicare, AARP    PT Start Time  0935    PT Stop Time  1020    PT Time Calculation (min)  45 min    Activity Tolerance  Patient tolerated treatment well;No increased pain;Patient limited by fatigue    Behavior During Therapy  [facility] for tasks assessed/performed       Past Medical History:  Diagnosis Date  . AAA (abdominal aortic aneurysm) (HCC)    asymptomatic  . AAA (abdominal aortic aneurysm) ([place]) 2010   peripheral  angiogram-- bilateral SFA DISEASE  and 60 to 70% infrarenaal abd. aortic stenosis with 15 -mm gradient  . Adenomatous colon polyp 11/2003  . CAD (coronary artery disease)   . Gait abnormality 02/13/2017  . Gastroparesis    pt denies  . GERD (gastroesophageal reflux disease)    w/ LPR  . History of kidney stones    x2  . Hyperlipidemia   . Hypertension   . Peripheral arterial disease ([place])    RCEA  by Dr Lemar Livings  . PUD (peptic ulcer disease)   . Vertigo     Past Surgical History:  Procedure Laterality Date  . AORTOGRAM  04/24/2016    Abdominal aortogram, bilateral iliac angiogram, bifemoral runoff  . CARDIAC CATHETERIZATION  05/12/2005   RCA  . carotid doppler  01/24/2013   RICA endarterectomy,left CCA 0-49%; left bulb and prox ICA 50-69%; bilaateral subclavian < 50%  . CAROTID ENDARTERECTOMY  11/01/2010  . CATARACT EXTRACTION    . CORONARY ANGIOPLASTY  05/18/2005   2 STENTS distal RCA AND PROXIMAL-MID RCA   5 total stents per pt.  . DOPPLER  ECHOCARDIOGRAPHY  02/07/2012   EF 55%,SHOWED NO ISCHEMIA   . HERNIA REPAIR     umbilical  . lower arterial  doppler  02/04/2013   aotra 1.5 x 1.5 cm; distal abd aorta 70-99%,proximal common iliac arteries -very stenotic with increased velocities>50%,may be falsely elevated as a result of residual plaque from the distal aorta stenosis  . lower extremity doppler  June 18 ,2013   ABI'S ABNORMAL, RABI was 0.88 and LABI 0.75 ,with 3-vessel  run off  . NM MYOVIEW LTD  MAY [id number]   showed no significant ischemia;  . NM MYOVIEW LTD  04/22/2008   ef 77%,exercise capcity 6 METS ,exaggerated blood pressure response to exercise  . PERIPHERAL VASCULAR CATHETERIZATION N/A 04/24/2016   Procedure: Lower Extremity Angiography;  Surgeon: Lorretta Harp, MD;  Location: [facility];  Service: Cardiovascular;  Laterality: N/A;  . PERIPHERAL VASCULAR CATHETERIZATION  04/24/2016   Procedure: Peripheral Vascular Intervention;  Surgeon: Lorretta Harp, MD;  Location: [facility];  Service: Cardiovascular;;  Aorta  . retrograde central aortic catheterization  05/19/2005   cutting balloon atherectomy, c-circ stenosis with DES STENTING CYPHER  . TONSILLECTOMY    . TRANSURETHRAL RESECTION OF PROSTATE N/A 09/08/2016   Procedure: TRANSURETHRAL RESECTION OF THE PROSTATE (TURP);  Surgeon: Ardis Hughs, MD;  Location:  [facility];  Service: Urology;  Laterality: N/A;  . TRANSURETHRAL RESECTION OF PROSTATE     10-10-17  Dr. Louis Meckel  . TRANSURETHRAL RESECTION OF PROSTATE N/A 10/10/2017   Procedure: TRANSURETHRAL RESECTION OF THE PROSTATE (TURP);  Surgeon: Ardis Hughs, MD;  [facility];  Service: Urology;  Laterality: N/A;  . VASECTOMY      There were no vitals filed for this visit.  Subjective Assessment - 12/07/17 0938    Subjective  Goes to see Dr. Veverly Fells on Tuesday about his back; does not feel his back pain is getting better.  Dizziness and imbalance in the morning is improving.    Pertinent  History  AAA, CAD, HTN, PAD, and vertigo    Limitations  Walking    Patient Stated Goals  to regain his balance and gains he made before in therapy    Currently in Pain?  Yes    Pain Score  2     Pain Location  Back    Pain Orientation  Lower    Pain Descriptors / Indicators  Discomfort    Pain Type  Chronic pain    Pain Onset  More than a month ago    Pain Relieving Factors  medication             Vestibular Assessment - 12/07/17 1004      Balancemaster   Engineer, materials Comment  composite score of 76; WNL       Sensory Organization Testing=76% compared to age/height normative value of 65%.   Patient demonstrated WNL use of somatosensory feedback for balance, WNL use of visual feedback for balance, and WNL use of vestibular feedback for balance.     Balance Exercises - 12/07/17 1012      Balance Exercises: Standing   Other Standing Exercises  On red mat performed 6 reps cone taps 2 sets each: forward and retro stepping tapping cones to same side and then one set forwards gait tapping cones on contralateral side (cross over taps) with min A and verbal cues for full weight shift pior to tapping        PT Education - 12/07/17 1346    Education provided  Yes    Education Details  results of SOT    Person(s) Educated  Patient    Methods  Explanation    Comprehension  Verbalized understanding       PT Short Term Goals - 11/28/17 2050      PT SHORT TERM GOAL #1   Title  Pt will participate in FGA assessment    Baseline  FGA=11/30    Time  4    Period  Weeks    Status  Achieved      PT SHORT TERM GOAL #2   Title  Pt will improve gait velocity to >/= 2.5 ft/sec with increase in stride length, R stance    Baseline  2.13 ft/sec    Time  4    Period  Weeks    Status  On-going      PT SHORT TERM GOAL #3   Title  Pt will decrease falls risk as indicated by increase in BERG by 4 points    Baseline  36/56    Time  4     Period  Weeks    Status  On-going        PT Long Term Goals - 11/28/17 2050      PT LONG  TERM GOAL #1   Title  Pt will be independent with HEP and will transition back to community classes at [employer]    Time  8    Period  Weeks    Status  New      PT LONG TERM GOAL #2   Title  Pt will improve FGA by 4 points from initial assessment    Baseline  11/30 baseline on 11/28/2017    Time  8    Period  Weeks    Status  New      PT LONG TERM GOAL #3   Title  Pt will improve BERG by 8 points from initial assessment    Baseline  36/56 eval    Time  8    Period  Weeks    Status  New      PT LONG TERM GOAL #4   Title  Pt will improve gait velocity to >/= 2.8 ft/sec     Time  8    Period  Weeks    Status  New      PT LONG TERM GOAL #5   Title  Pt will improve LE strength to 5/5 bilaterally     Baseline  4-/5 hip flexion, RLE slightly decreased in strength to LLE    Time  8    Period  Weeks    Status  New            Plan - 12/07/17 1020    Clinical Impression Statement  Performed re-assessment of sensory organization testing to assess pt's ability to utilize sensory systems for balance.  Compared to 02/2017 pt continues to demonstrate WNL performance on all conditions, WNL composite score and WNL use of all sensory systems.  No LTG for SOT needed.  Continued balance, weight shifting and SLS training on compliant surface with cone taps; pt tolerated well with no increase in back pain but did require one seated rest break due to LE fatigue.  Pt would like to continue to work on SLS and tandem stance.  Will continue to progress towards LTG.    PT Treatment/Interventions  ADLs/Self Care Home Management;Gait training;Stair training;Functional mobility training;Therapeutic activities;Therapeutic exercise;Balance training;Neuromuscular re-education;Patient/family education;Vestibular    PT Next Visit Plan  keep working on SLS, tandem    Consulted and Agree with Plan of Care  Patient        Patient will benefit from skilled therapeutic intervention in order to improve the following deficits and impairments:  Abnormal gait, Decreased balance, Decreased strength, Difficulty walking, Dizziness  Visit Diagnosis: Unsteadiness on feet  Other abnormalities of gait and mobility  Muscle weakness (generalized)     Problem List Patient Active Problem List   Diagnosis Date Noted  . Hematuria 10/10/2017  . Gait abnormality 02/13/2017  . UTI (urinary tract infection) 11/22/2016  . Vertigo 11/22/2016  . Hypokalemia 11/22/2016  . Urinary frequency 09/08/2016  . Claudication ([place]) 04/24/2016  . Bilateral carotid artery disease ([place]) 08/07/2014  . Peripheral arterial disease ([place]) 08/06/2013  . GASTROPARESIS 02/25/2009  . COLONIC POLYPS, ADENOMATOUS, HX OF 02/22/2009  . HYPERCHOLESTEROLEMIA 09/26/2007  . Essential hypertension 09/26/2007  . Coronary atherosclerosis 09/26/2007  . ALLERGIC RHINITIS 09/26/2007  . GERD 09/26/2007  . Peptic ulcer 09/26/2007  . UMBILICAL HERNIA 40/98/1191  . NEPHROLITHIASIS 09/26/2007  . BLADDER STONE 09/26/2007    Rico Junker, PT, DPT 12/07/17    1:52 PM    [place] [address] [place],  [place], [address] Phone: [phone]   Fax:  [phone]  Name: Nathan Santiago MRN: [id number] Date of Birth: 05/13/1931

## 2017-12-11 DIAGNOSIS — M545 Low back pain: Secondary | ICD-10-CM | POA: Diagnosis not present

## 2017-12-14 ENCOUNTER — Ambulatory Visit: Payer: Medicare Other | Admitting: Physical Therapy

## 2017-12-14 DIAGNOSIS — M6281 Muscle weakness (generalized): Secondary | ICD-10-CM | POA: Diagnosis not present

## 2017-12-14 DIAGNOSIS — R42 Dizziness and giddiness: Secondary | ICD-10-CM | POA: Diagnosis not present

## 2017-12-14 DIAGNOSIS — R2689 Other abnormalities of gait and mobility: Secondary | ICD-10-CM | POA: Diagnosis not present

## 2017-12-14 DIAGNOSIS — R2681 Unsteadiness on feet: Secondary | ICD-10-CM | POA: Diagnosis not present

## 2017-12-14 NOTE — Therapy (Signed)
[facility] [address] [place], [place], [address] Phone: [phone]   Fax:  [phone]  Physical Therapy Treatment  Patient Details  Name: Nathan Santiago MRN: [id number] Date of Birth: [birth year] Referring Provider: Ward Givens, NP; Kathrynn Ducking, MD   Encounter Date: 12/14/2017  PT End of Session - 12/14/17 1427    Visit Number  6    Number of Visits  17    Date for PT Re-Evaluation  01/19/18    Authorization Type  Medicare, AARP    PT Start Time  1025    PT Stop Time  1106    PT Time Calculation (min)  41 min    Activity Tolerance  Patient tolerated treatment well    Behavior During Therapy  [facility] for tasks assessed/performed       Past Medical History:  Diagnosis Date  . AAA (abdominal aortic aneurysm) (HCC)    asymptomatic  . AAA (abdominal aortic aneurysm) ([place]) 2010   peripheral  angiogram-- bilateral SFA DISEASE  and 60 to 70% infrarenaal abd. aortic stenosis with 15 -mm gradient  . Adenomatous colon polyp 11/2003  . CAD (coronary artery disease)   . Gait abnormality 02/13/2017  . Gastroparesis    pt denies  . GERD (gastroesophageal reflux disease)    w/ LPR  . History of kidney stones    x2  . Hyperlipidemia   . Hypertension   . Peripheral arterial disease ([place])    RCEA  by Dr Lemar Livings  . PUD (peptic ulcer disease)   . Vertigo     Past Surgical History:  Procedure Laterality Date  . AORTOGRAM  04/24/2016    Abdominal aortogram, bilateral iliac angiogram, bifemoral runoff  . CARDIAC CATHETERIZATION  05/12/2005   RCA  . carotid doppler  01/24/2013   RICA endarterectomy,left CCA 0-49%; left bulb and prox ICA 50-69%; bilaateral subclavian < 50%  . CAROTID ENDARTERECTOMY  11/01/2010  . CATARACT EXTRACTION    . CORONARY ANGIOPLASTY  05/18/2005   2 STENTS distal RCA AND PROXIMAL-MID RCA   5 total stents per pt.  . DOPPLER ECHOCARDIOGRAPHY  02/07/2012   EF 55%,SHOWED NO ISCHEMIA    . HERNIA REPAIR     umbilical  . lower arterial  doppler  02/04/2013   aotra 1.5 x 1.5 cm; distal abd aorta 70-99%,proximal common iliac arteries -very stenotic with increased velocities>50%,may be falsely elevated as a result of residual plaque from the distal aorta stenosis  . lower extremity doppler  June 18 ,2013   ABI'S ABNORMAL, RABI was 0.88 and LABI 0.75 ,with 3-vessel  run off  . NM MYOVIEW LTD  MAY [id number]   showed no significant ischemia;  . NM MYOVIEW LTD  04/22/2008   ef 77%,exercise capcity 6 METS ,exaggerated blood pressure response to exercise  . PERIPHERAL VASCULAR CATHETERIZATION N/A 04/24/2016   Procedure: Lower Extremity Angiography;  Surgeon: Lorretta Harp, MD;  Location: [facility];  Service: Cardiovascular;  Laterality: N/A;  . PERIPHERAL VASCULAR CATHETERIZATION  04/24/2016   Procedure: Peripheral Vascular Intervention;  Surgeon: Lorretta Harp, MD;  Location: [facility];  Service: Cardiovascular;;  Aorta  . retrograde central aortic catheterization  05/19/2005   cutting balloon atherectomy, c-circ stenosis with DES STENTING CYPHER  . TONSILLECTOMY    . TRANSURETHRAL RESECTION OF PROSTATE N/A 09/08/2016   Procedure: TRANSURETHRAL RESECTION OF THE PROSTATE (TURP);  Surgeon: Ardis Hughs, MD;  [facility];  Service: Urology;  Laterality: N/A;  . TRANSURETHRAL RESECTION OF PROSTATE     10-10-17  Dr. Louis Meckel  . TRANSURETHRAL RESECTION OF PROSTATE N/A 10/10/2017   Procedure: TRANSURETHRAL RESECTION OF THE PROSTATE (TURP);  Surgeon: Ardis Hughs, MD;  [facility];  Service: Urology;  Laterality: N/A;  . VASECTOMY      There were no vitals filed for this visit.  Subjective Assessment - 12/14/17 1035    Subjective  Pt reporting back pain is worse today; brought in referral from Dr. Veverly Fells for low back assessment and treatment.  Asking if he should take referral to [facility] PT?    Pertinent History  AAA, CAD, HTN, PAD,  and vertigo    Limitations  Walking    Patient Stated Goals  to regain his balance and gains he made before in therapy    Currently in Pain?  Yes    Pain Score  6     Pain Location  Back    Pain Orientation  Lower    Pain Descriptors / Indicators  Aching;Discomfort    Pain Onset  More than a month ago                       [facility] Adult PT Treatment/Exercise - 12/14/17 1045      Exercises   Exercises  Knee/Hip      Knee/Hip Exercises: Standing   Lateral Step Up  Right;Left;2 sets;10 reps;Hand Hold: 1;Step Height: 4"    Lateral Step Up Limitations  solid step and compliant step    Forward Step Up  Right;Left;2 sets;10 reps;Hand Hold: 1;Step Height: 4"    Forward Step Up Limitations  solid step and compliant step          Balance Exercises - 12/14/17 1058      Balance Exercises: Standing   SLS with Vectors  Foam/compliant surface;Upper extremity assist 1;Other reps (comment) EO, EC 8 sets tapping across, middle, side, R/LLE    Tandem Gait  Forward;Retro;Upper extremity support;5 reps;Foam/compliant surface;Other (comment) blue foam beam, added head nods/turns        PT Education - 12/14/17 1426    Education provided  Yes    Education Details  discussed options for addressing LBP and referral to PT for low back: take referral to [facility] for orthopedic evaluation/treatment OR incorporate low back treatment into current POC at neuro PT    Person(s) Educated  Patient    Methods  Explanation    Comprehension  Verbalized understanding       PT Short Term Goals - 11/28/17 2050      PT SHORT TERM GOAL #1   Title  Pt will participate in FGA assessment    Baseline  FGA=11/30    Time  4    Period  Weeks    Status  Achieved      PT SHORT TERM GOAL #2   Title  Pt will improve gait velocity to >/= 2.5 ft/sec with increase in stride length, R stance    Baseline  2.13 ft/sec    Time  4    Period  Weeks    Status  On-going      PT SHORT TERM GOAL #3    Title  Pt will decrease falls risk as indicated by increase in BERG by 4 points    Baseline  36/56    Time  4    Period  Weeks    Status  On-going  PT Long Term Goals - 11/28/17 2050      PT LONG TERM GOAL #1   Title  Pt will be independent with HEP and will transition back to community classes at [employer]    Time  8    Period  Weeks    Status  New      PT LONG TERM GOAL #2   Title  Pt will improve FGA by 4 points from initial assessment    Baseline  11/30 baseline on 11/28/2017    Time  8    Period  Weeks    Status  New      PT LONG TERM GOAL #3   Title  Pt will improve BERG by 8 points from initial assessment    Baseline  36/56 eval    Time  8    Period  Weeks    Status  New      PT LONG TERM GOAL #4   Title  Pt will improve gait velocity to >/= 2.8 ft/sec     Time  8    Period  Weeks    Status  New      PT LONG TERM GOAL #5   Title  Pt will improve LE strength to 5/5 bilaterally     Baseline  4-/5 hip flexion, RLE slightly decreased in strength to LLE    Time  8    Period  Weeks    Status  New            Plan - 12/14/17 1428    Clinical Impression Statement  Pt reporting increase in low back pain today but did not interfere with therapy treatment.  Following discussion regarding options for PT for low back pt decided to take referral to [facility] for treatment which is where he received treatment for his cervical spine.  Continued to focus on LE strengthening, weight shifting, SLS and narrow BOS training on solid and compliant surfaces with and without visual input.  Added head turns to tandem gait on balance beam to increase challenge.  Pt tolerated well and reports he feels the balance therapy is helping him to walk without veering.  Will continue to address and progress towards LTG.    PT Treatment/Interventions  ADLs/Self Care Home Management;Gait training;Stair training;Functional mobility training;Therapeutic activities;Therapeutic  exercise;Balance training;Neuromuscular re-education;Patient/family education;Vestibular    PT Next Visit Plan  STG due 5/23; continue to focus on SLS/tandem, update HEP after performing STG assessment    Consulted and Agree with Plan of Care  Patient       Patient will benefit from skilled therapeutic intervention in order to improve the following deficits and impairments:  Abnormal gait, Decreased balance, Decreased strength, Difficulty walking, Dizziness  Visit Diagnosis: Unsteadiness on feet  Other abnormalities of gait and mobility  Muscle weakness (generalized)     Problem List Patient Active Problem List   Diagnosis Date Noted  . Hematuria 10/10/2017  . Gait abnormality 02/13/2017  . UTI (urinary tract infection) 11/22/2016  . Vertigo 11/22/2016  . Hypokalemia 11/22/2016  . Urinary frequency 09/08/2016  . Claudication ([place]) 04/24/2016  . Bilateral carotid artery disease ([place]) 08/07/2014  . Peripheral arterial disease ([place]) 08/06/2013  . GASTROPARESIS 02/25/2009  . COLONIC POLYPS, ADENOMATOUS, HX OF 02/22/2009  . HYPERCHOLESTEROLEMIA 09/26/2007  . Essential hypertension 09/26/2007  . Coronary atherosclerosis 09/26/2007  . ALLERGIC RHINITIS 09/26/2007  . GERD 09/26/2007  . Peptic ulcer 09/26/2007  . UMBILICAL HERNIA 69/48/5462  . NEPHROLITHIASIS 09/26/2007  .  BLADDER STONE 09/26/2007    Rico Junker, PT, DPT 12/14/17    2:33 PM    [facility] [address] [place], [place], [address] Phone: [phone]   Fax:  [phone]  Name: Nathan Santiago MRN: [id number] Date of Birth: 05/06/1931

## 2017-12-17 ENCOUNTER — Encounter: Payer: Self-pay | Admitting: Physical Therapy

## 2017-12-17 ENCOUNTER — Ambulatory Visit: Payer: Medicare Other | Admitting: Physical Therapy

## 2017-12-17 DIAGNOSIS — M6281 Muscle weakness (generalized): Secondary | ICD-10-CM | POA: Diagnosis not present

## 2017-12-17 DIAGNOSIS — R2689 Other abnormalities of gait and mobility: Secondary | ICD-10-CM

## 2017-12-17 DIAGNOSIS — R42 Dizziness and giddiness: Secondary | ICD-10-CM | POA: Diagnosis not present

## 2017-12-17 DIAGNOSIS — R2681 Unsteadiness on feet: Secondary | ICD-10-CM | POA: Diagnosis not present

## 2017-12-17 NOTE — Patient Instructions (Addendum)
Functional Quadriceps: Sit to Stand    Sit in a lower chair.  Sit on edge of chair, feet flat on floor. Stand upright, extending knees fully. Repeat __10-12__ times per set. Do ___2_ sets per session. Do __1-2__ sessions per day.    Feet Partial Heel-Toe, Varied Arm Positions - Eyes Closed    Stand with right foot partially in front of the other and arms AT YOUR SIDE. KEEP eyes OPEN and visualize upright position. Hold _30___ seconds.  Switch feet and repeat. CLOSE EYES and hold balance for 10 seconds, switch feet and repeat Perform 2 times on each side.  Copyright  VHI. All rights reserved.   SINGLE LIMB STANCE    Stance: single leg on floor. Raise leg. Hold _10-15__ seconds. Repeat with other leg. _3__ reps per set, 2 times/day. STAND NEAR A COUNTERTOP FOR SUPPORT.  Copyright  VHI. All rights reserved.           Tandem Walking    Walk forwards next to the counter, one hand on the counter, with each foot directly in front of other, heel of one foot touching toes of other foot with each step. Both feet straight ahead.   Go to Chair Fit 1!

## 2017-12-17 NOTE — Therapy (Signed)
[facility] [address] [place], [place], [address] Phone: [phone]   Fax:  [phone]  Physical Therapy Treatment  Patient Details  Name: Nathan Santiago MRN: [id number] Date of Birth: 14-Oct-1930 Referring Provider: Ward Givens, NP; Kathrynn Ducking, MD   Encounter Date: 12/17/2017  PT End of Session - 12/17/17 1635    Visit Number  7    Number of Visits  17    Date for PT Re-Evaluation  01/19/18    Authorization Type  Medicare, AARP    PT Start Time  9798    PT Stop Time  1538    PT Time Calculation (min)  53 min    Activity Tolerance  Patient tolerated treatment well    Behavior During Therapy  [facility] for tasks assessed/performed       Past Medical History:  Diagnosis Date  . AAA (abdominal aortic aneurysm) (HCC)    asymptomatic  . AAA (abdominal aortic aneurysm) ([place]) 2010   peripheral  angiogram-- bilateral SFA DISEASE  and 60 to 70% infrarenaal abd. aortic stenosis with 15 -mm gradient  . Adenomatous colon polyp 11/2003  . CAD (coronary artery disease)   . Gait abnormality 02/13/2017  . Gastroparesis    pt denies  . GERD (gastroesophageal reflux disease)    w/ LPR  . History of kidney stones    x2  . Hyperlipidemia   . Hypertension   . Peripheral arterial disease ([place])    RCEA  by Dr Lemar Livings  . PUD (peptic ulcer disease)   . Vertigo     Past Surgical History:  Procedure Laterality Date  . AORTOGRAM  04/24/2016    Abdominal aortogram, bilateral iliac angiogram, bifemoral runoff  . CARDIAC CATHETERIZATION  05/12/2005   RCA  . carotid doppler  01/24/2013   RICA endarterectomy,left CCA 0-49%; left bulb and prox ICA 50-69%; bilaateral subclavian < 50%  . CAROTID ENDARTERECTOMY  11/01/2010  . CATARACT EXTRACTION    . CORONARY ANGIOPLASTY  05/18/2005   2 STENTS distal RCA AND PROXIMAL-MID RCA   5 total stents per pt.  . DOPPLER ECHOCARDIOGRAPHY  02/07/2012   EF 55%,SHOWED NO ISCHEMIA     . HERNIA REPAIR     umbilical  . lower arterial  doppler  02/04/2013   aotra 1.5 x 1.5 cm; distal abd aorta 70-99%,proximal common iliac arteries -very stenotic with increased velocities>50%,may be falsely elevated as a result of residual plaque from the distal aorta stenosis  . lower extremity doppler  June 18 ,2013   ABI'S ABNORMAL, RABI was 0.88 and LABI 0.75 ,with 3-vessel  run off  . NM MYOVIEW LTD  MAY [id number]   showed no significant ischemia;  . NM MYOVIEW LTD  04/22/2008   ef 77%,exercise capcity 6 METS ,exaggerated blood pressure response to exercise  . PERIPHERAL VASCULAR CATHETERIZATION N/A 04/24/2016   Procedure: Lower Extremity Angiography;  Surgeon: Lorretta Harp, MD;  Location: [facility];  Service: Cardiovascular;  Laterality: N/A;  . PERIPHERAL VASCULAR CATHETERIZATION  04/24/2016   Procedure: Peripheral Vascular Intervention;  Surgeon: Lorretta Harp, MD;  Location: [facility];  Service: Cardiovascular;;  Aorta  . retrograde central aortic catheterization  05/19/2005   cutting balloon atherectomy, c-circ stenosis with DES STENTING CYPHER  . TONSILLECTOMY    . TRANSURETHRAL RESECTION OF PROSTATE N/A 09/08/2016   Procedure: TRANSURETHRAL RESECTION OF THE PROSTATE (TURP);  Surgeon: Ardis Hughs, MD;  [facility];  Service:  Urology;  Laterality: N/A;  . TRANSURETHRAL RESECTION OF PROSTATE     10-10-17  Dr. Louis Meckel  . TRANSURETHRAL RESECTION OF PROSTATE N/A 10/10/2017   Procedure: TRANSURETHRAL RESECTION OF THE PROSTATE (TURP);  Surgeon: Ardis Hughs, MD;  [facility];  Service: Urology;  Laterality: N/A;  . VASECTOMY      There were no vitals filed for this visit.  Subjective Assessment - 12/17/17 1459    Subjective  Used heating pad all weekend; back feels better today.  Needs to cancel Thursday's appointment due to getting appointment with orthopedic PT for evaluation of low back.    Pertinent History  AAA, CAD, HTN, PAD, and  vertigo    Limitations  Walking    Patient Stated Goals  to regain his balance and gains he made before in therapy    Pain Score  2     Pain Location  Back    Pain Orientation  Lower    Pain Descriptors / Indicators  Tightness    Pain Onset  More than a month ago         [facility] PT Assessment - 12/17/17 1501      Standardized Balance Assessment   10 Meter Walk  10.22 seconds or 3.2 ft/sec      Berg Balance Test   Sit to Stand  Able to stand without using hands and stabilize independently    Standing Unsupported  Able to stand safely 2 minutes    Sitting with Back Unsupported but Feet Supported on Floor or Stool  Able to sit safely and securely 2 minutes    Stand to Sit  Sits safely with minimal use of hands    Transfers  Able to transfer safely, minor use of hands    Standing Unsupported with Eyes Closed  Able to stand 10 seconds safely    Standing Ubsupported with Feet Together  Able to place feet together independently and stand 1 minute safely    From Standing, Reach Forward with Outstretched Arm  Can reach confidently >25 cm (10")    From Standing Position, Pick up Object from Floor  Able to pick up shoe, needs supervision    From Standing Position, Turn to Look Behind Over each Shoulder  Looks behind from both sides and weight shifts well    Turn 360 Degrees  Able to turn 360 degrees safely one side only in 4 seconds or less    Standing Unsupported, Alternately Place Feet on Step/Stool  Able to stand independently and safely and complete 8 steps in 20 seconds    Standing Unsupported, One Foot in Front  Able to plae foot ahead of the other independently and hold 30 seconds    Standing on One Leg  Able to lift leg independently and hold 5-10 seconds    Total Score  52    Berg comment:  52/56      Updated HEP as below:   Functional Quadriceps: Sit to Stand    Sit in a lower chair.  Sit on edge of chair, feet flat on floor. Stand upright, extending knees fully. Repeat  __10-12__ times per set. Do ___2_ sets per session. Do __1-2__ sessions per day.    Feet Partial Heel-Toe, Varied Arm Positions - Eyes Closed    Stand with right foot partially in front of the other and arms AT YOUR SIDE. KEEP eyes OPEN and visualize upright position. Hold _30___ seconds.  Switch feet and repeat. CLOSE EYES and hold balance  for 10 seconds, switch feet and repeat Perform 2 times on each side.  Copyright  VHI. All rights reserved.   SINGLE LIMB STANCE    Stance: single leg on floor. Raise leg. Hold _10-15__ seconds. Repeat with other leg. _3__ reps per set, 2 times/day. STAND NEAR A COUNTERTOP FOR SUPPORT.  Copyright  VHI. All rights reserved.           Tandem Walking    Walk forwards next to the counter, one hand on the counter, with each foot directly in front of other, heel of one foot touching toes of other foot with each step. Both feet straight ahead.   Go to Chair Fit 1!       PT Education - 12/17/17 1634    Education provided  Yes    Education Details  progress made based on STG, updated HEP    Person(s) Educated  Patient    Methods  Explanation    Comprehension  Verbalized understanding       PT Short Term Goals - 12/17/17 1643      PT SHORT TERM GOAL #1   Title  Pt will participate in FGA assessment    Baseline  FGA=11/30    Time  4    Period  Weeks    Status  Achieved      PT SHORT TERM GOAL #2   Title  Pt will improve gait velocity to >/= 2.5 ft/sec with increase in stride length, R stance    Baseline  3.2 ft/sec    Time  4    Period  Weeks    Status  Achieved      PT SHORT TERM GOAL #3   Title  Pt will decrease falls risk as indicated by increase in BERG by 4 points    Baseline  52/56    Time  4    Period  Weeks    Status  Achieved        PT Long Term Goals - 12/17/17 1644      PT LONG TERM GOAL #1   Title  Pt will be independent with HEP and will transition back to community classes at [place] [facility]     Time  8    Period  Weeks    Status  New    Target Date  01/19/18      PT LONG TERM GOAL #2   Title  Pt will improve FGA by 4 points from initial assessment    Baseline  11/30 baseline on 11/28/2017    Time  8    Period  Weeks    Status  New    Target Date  01/19/18      PT LONG TERM GOAL #3   Title  Pt will improve BERG to >/= 54/56    Baseline  52/56 at 4 weeks    Time  8    Period  Weeks    Status  Revised    Target Date  01/19/18      PT LONG TERM GOAL #4   Title  Pt will improve gait velocity to >/= 3.6 ft/sec     Baseline  3.2 at 4 weeks    Time  8    Period  Weeks    Status  Revised    Target Date  01/19/18      PT LONG TERM GOAL #5   Title  Pt will improve LE strength to 5/5 bilaterally     Baseline  4-/5 hip flexion, RLE slightly decreased in strength to LLE    Time  8    Period  Weeks    Status  New    Target Date  01/19/18            Plan - 12/17/17 1635    Clinical Impression Statement  Back pain improved today; pt tolerated re-assessment of balance with BERG balance test and 10 meter walk test for gait velocity.  Pt demonstrated significant improvement in standing balance and overall gait velocity demonstrating decreased falls risk.  Due to significant progress, current HEP upgraded except for corner balance; pt downgraded to 30 seconds with eyes open, 10 seconds eyes closed due to continued difficulty with maintaining tandem with EO.  Pt tolerated well with no increase in pain.  Will continue to progress towards LTG.    PT Treatment/Interventions  ADLs/Self Care Home Management;Gait training;Stair training;Functional mobility training;Therapeutic activities;Therapeutic exercise;Balance training;Neuromuscular re-education;Patient/family education;Vestibular    PT Next Visit Plan  Did he attend the Chair Fit 1 class at [place]?  continue to focus on SLS/tandem, continue to upgrade HEP    Consulted and Agree with Plan of Care  Patient       Patient will  benefit from skilled therapeutic intervention in order to improve the following deficits and impairments:  Abnormal gait, Decreased balance, Decreased strength, Difficulty walking, Dizziness  Visit Diagnosis: Unsteadiness on feet  Other abnormalities of gait and mobility  Muscle weakness (generalized)     Problem List Patient Active Problem List   Diagnosis Date Noted  . Hematuria 10/10/2017  . Gait abnormality 02/13/2017  . UTI (urinary tract infection) 11/22/2016  . Vertigo 11/22/2016  . Hypokalemia 11/22/2016  . Urinary frequency 09/08/2016  . Claudication ([place]) 04/24/2016  . Bilateral carotid artery disease ([place]) 08/07/2014  . Peripheral arterial disease ([place]) 08/06/2013  . GASTROPARESIS 02/25/2009  . COLONIC POLYPS, ADENOMATOUS, HX OF 02/22/2009  . HYPERCHOLESTEROLEMIA 09/26/2007  . Essential hypertension 09/26/2007  . Coronary atherosclerosis 09/26/2007  . ALLERGIC RHINITIS 09/26/2007  . GERD 09/26/2007  . Peptic ulcer 09/26/2007  . UMBILICAL HERNIA 74/82/7078  . NEPHROLITHIASIS 09/26/2007  . BLADDER STONE 09/26/2007   Rico Junker, PT, DPT 12/17/17    4:47 PM    [facility] [address] [place], [place], [address] Phone: [phone]   Fax:  [phone]  Name: Nathan Santiago MRN: [id number] Date of Birth: 27-Oct-1930

## 2017-12-18 DIAGNOSIS — R310 Gross hematuria: Secondary | ICD-10-CM | POA: Diagnosis not present

## 2017-12-19 ENCOUNTER — Ambulatory Visit (HOSPITAL_COMMUNITY)
Admission: RE | Admit: 2017-12-19 | Discharge: 2017-12-19 | Disposition: A | Discharge status: Home / Self Care | Payer: Medicare Other | Source: Ambulatory Visit | Attending: Cardiovascular Disease | Admitting: Cardiovascular Disease

## 2017-12-19 ENCOUNTER — Ambulatory Visit (HOSPITAL_BASED_OUTPATIENT_CLINIC_OR_DEPARTMENT_OTHER)
Admission: RE | Admit: 2017-12-19 | Discharge: 2017-12-19 | Disposition: A | Discharge status: Home / Self Care | Payer: Medicare Other | Source: Ambulatory Visit | Attending: Cardiovascular Disease | Admitting: Cardiovascular Disease

## 2017-12-19 ENCOUNTER — Ambulatory Visit: Payer: Self-pay | Admitting: Physical Therapy

## 2017-12-19 ENCOUNTER — Other Ambulatory Visit: Payer: Self-pay | Admitting: *Deleted

## 2017-12-19 DIAGNOSIS — Z87891 Personal history of nicotine dependence: Secondary | ICD-10-CM | POA: Insufficient documentation

## 2017-12-19 DIAGNOSIS — I2510 Atherosclerotic heart disease of native coronary artery without angina pectoris: Secondary | ICD-10-CM | POA: Insufficient documentation

## 2017-12-19 DIAGNOSIS — I739 Peripheral vascular disease, unspecified: Secondary | ICD-10-CM

## 2017-12-19 DIAGNOSIS — I6523 Occlusion and stenosis of bilateral carotid arteries: Secondary | ICD-10-CM | POA: Insufficient documentation

## 2017-12-19 DIAGNOSIS — I10 Essential (primary) hypertension: Secondary | ICD-10-CM | POA: Insufficient documentation

## 2017-12-19 DIAGNOSIS — E785 Hyperlipidemia, unspecified: Secondary | ICD-10-CM | POA: Insufficient documentation

## 2017-12-19 DIAGNOSIS — I708 Atherosclerosis of other arteries: Secondary | ICD-10-CM | POA: Diagnosis not present

## 2017-12-20 ENCOUNTER — Encounter: Payer: Medicare Other | Admitting: Physical Therapy

## 2017-12-20 ENCOUNTER — Telehealth: Payer: Self-pay | Admitting: Cardiovascular Disease

## 2017-12-20 DIAGNOSIS — M545 Low back pain: Secondary | ICD-10-CM | POA: Diagnosis not present

## 2017-12-20 NOTE — Telephone Encounter (Signed)
New Message   Pt states he had 3 test on 5/22 but only received the results for 2. Please call

## 2017-12-20 NOTE — Telephone Encounter (Signed)
Left a message to call back.

## 2017-12-21 DIAGNOSIS — T169XXA Foreign body in ear, unspecified ear, initial encounter: Secondary | ICD-10-CM | POA: Insufficient documentation

## 2017-12-21 DIAGNOSIS — T161XXA Foreign body in right ear, initial encounter: Secondary | ICD-10-CM | POA: Diagnosis not present

## 2017-12-21 NOTE — Telephone Encounter (Signed)
Patient is awaiting his ABI results.

## 2017-12-21 NOTE — Telephone Encounter (Signed)
Right ABI .88, left was not compressible. Ao duplex was ok. gOOD RESULTS

## 2017-12-21 NOTE — Telephone Encounter (Signed)
Pt calling   Stating he is returning call from yesterday concerning results of test

## 2017-12-25 DIAGNOSIS — L820 Inflamed seborrheic keratosis: Secondary | ICD-10-CM | POA: Diagnosis not present

## 2017-12-25 DIAGNOSIS — L812 Freckles: Secondary | ICD-10-CM | POA: Diagnosis not present

## 2017-12-25 DIAGNOSIS — L821 Other seborrheic keratosis: Secondary | ICD-10-CM | POA: Diagnosis not present

## 2017-12-25 DIAGNOSIS — L918 Other hypertrophic disorders of the skin: Secondary | ICD-10-CM | POA: Diagnosis not present

## 2017-12-25 DIAGNOSIS — D692 Other nonthrombocytopenic purpura: Secondary | ICD-10-CM | POA: Diagnosis not present

## 2017-12-26 ENCOUNTER — Ambulatory Visit: Payer: Medicare Other | Admitting: Physical Therapy

## 2017-12-26 ENCOUNTER — Encounter: Payer: Self-pay | Admitting: Physical Therapy

## 2017-12-26 DIAGNOSIS — R2689 Other abnormalities of gait and mobility: Secondary | ICD-10-CM | POA: Diagnosis not present

## 2017-12-26 DIAGNOSIS — R42 Dizziness and giddiness: Secondary | ICD-10-CM

## 2017-12-26 DIAGNOSIS — M6281 Muscle weakness (generalized): Secondary | ICD-10-CM

## 2017-12-26 DIAGNOSIS — K760 Fatty (change of) liver, not elsewhere classified: Secondary | ICD-10-CM | POA: Diagnosis not present

## 2017-12-26 DIAGNOSIS — R310 Gross hematuria: Secondary | ICD-10-CM | POA: Diagnosis not present

## 2017-12-26 DIAGNOSIS — R2681 Unsteadiness on feet: Secondary | ICD-10-CM

## 2017-12-26 NOTE — Telephone Encounter (Signed)
Left message of results for pt  

## 2017-12-26 NOTE — Therapy (Signed)
[facility] [address] [place], [place], [address] Phone: [phone]   Fax:  [phone]  Physical Therapy Treatment  Patient Details  Name: Nathan Santiago MRN: [id number] Date of Birth: 10-21-1930 Referring Provider: Ward Givens, NP; Kathrynn Ducking, MD   Encounter Date: 12/26/2017  PT End of Session - 12/26/17 1321    Visit Number  8    Number of Visits  17    Date for PT Re-Evaluation  01/19/18    Authorization Type  Medicare, AARP    PT Start Time  1319    PT Stop Time  1359    PT Time Calculation (min)  40 min    Activity Tolerance  Patient tolerated treatment well    Behavior During Therapy  [facility] for tasks assessed/performed       Past Medical History:  Diagnosis Date  . AAA (abdominal aortic aneurysm) (HCC)    asymptomatic  . AAA (abdominal aortic aneurysm) ([place]) 2010   peripheral  angiogram-- bilateral SFA DISEASE  and 60 to 70% infrarenaal abd. aortic stenosis with 15 -mm gradient  . Adenomatous colon polyp 11/2003  . CAD (coronary artery disease)   . Gait abnormality 02/13/2017  . Gastroparesis    pt denies  . GERD (gastroesophageal reflux disease)    w/ LPR  . History of kidney stones    x2  . Hyperlipidemia   . Hypertension   . Peripheral arterial disease ([place])    RCEA  by Dr Lemar Livings  . PUD (peptic ulcer disease)   . Vertigo     Past Surgical History:  Procedure Laterality Date  . AORTOGRAM  04/24/2016    Abdominal aortogram, bilateral iliac angiogram, bifemoral runoff  . CARDIAC CATHETERIZATION  05/12/2005   RCA  . carotid doppler  01/24/2013   RICA endarterectomy,left CCA 0-49%; left bulb and prox ICA 50-69%; bilaateral subclavian < 50%  . CAROTID ENDARTERECTOMY  11/01/2010  . CATARACT EXTRACTION    . CORONARY ANGIOPLASTY  05/18/2005   2 STENTS distal RCA AND PROXIMAL-MID RCA   5 total stents per pt.  . DOPPLER ECHOCARDIOGRAPHY  02/07/2012   EF 55%,SHOWED NO ISCHEMIA    . HERNIA REPAIR     umbilical  . lower arterial  doppler  02/04/2013   aotra 1.5 x 1.5 cm; distal abd aorta 70-99%,proximal common iliac arteries -very stenotic with increased velocities>50%,may be falsely elevated as a result of residual plaque from the distal aorta stenosis  . lower extremity doppler  June 18 ,2013   ABI'S ABNORMAL, RABI was 0.88 and LABI 0.75 ,with 3-vessel  run off  . NM MYOVIEW LTD  MAY [id number]   showed no significant ischemia;  . NM MYOVIEW LTD  04/22/2008   ef 77%,exercise capcity 6 METS ,exaggerated blood pressure response to exercise  . PERIPHERAL VASCULAR CATHETERIZATION N/A 04/24/2016   Procedure: Lower Extremity Angiography;  Surgeon: Lorretta Harp, MD;  Location: [facility];  Service: Cardiovascular;  Laterality: N/A;  . PERIPHERAL VASCULAR CATHETERIZATION  04/24/2016   Procedure: Peripheral Vascular Intervention;  Surgeon: Lorretta Harp, MD;  Location: [facility];  Service: Cardiovascular;;  Aorta  . retrograde central aortic catheterization  05/19/2005   cutting balloon atherectomy, c-circ stenosis with DES STENTING CYPHER  . TONSILLECTOMY    . TRANSURETHRAL RESECTION OF PROSTATE N/A 09/08/2016   Procedure: TRANSURETHRAL RESECTION OF THE PROSTATE (TURP);  Surgeon: Ardis Hughs, MD;  Location: [facility];  Service: Urology;  Laterality: N/A;  . TRANSURETHRAL RESECTION OF PROSTATE     10-10-17  Dr. Louis Meckel  . TRANSURETHRAL RESECTION OF PROSTATE N/A 10/10/2017   Procedure: TRANSURETHRAL RESECTION OF THE PROSTATE (TURP);  Surgeon: Ardis Hughs, MD;  Location: [facility];  Service: Urology;  Laterality: N/A;  . VASECTOMY      There were no vitals filed for this visit.  Subjective Assessment - 12/26/17 1319    Subjective  No new complaints. No falls to report, near fall this am due to tripping over wheelchair. Has started with ortho PT for back pain, "she about killed me with that stretch, but I think it's helping".  Has not attended Chair  Fit class as yet due to busy schedule.     Pertinent History  AAA, CAD, HTN, PAD, and vertigo    Limitations  Walking    Patient Stated Goals  to regain his balance and gains he made before in therapy    Currently in Pain?  Yes    Pain Score  3     Pain Location  Back    Pain Orientation  Lower    Pain Descriptors / Indicators  Aching;Tightness    Pain Type  Chronic pain    Pain Onset  More than a month ago    Pain Frequency  Intermittent    Aggravating Factors   increased activity    Pain Relieving Factors  medication            Balance Exercises - 12/26/17 1322      Balance Exercises: Standing   SLS with Vectors  Foam/compliant surface;Intermittent upper extremity assist;Other reps (comment);Limitations    Rockerboard  Anterior/posterior;Lateral;Head turns;EO;EC;30 seconds;10 reps    Balance Beam  standing across red beam: alternating fwd heel taps to floor/back onto beam x 5 reps each side. min to mod assist with cues on posture, stance position, step length and step height to clear beam.     Partial Tandem Stance  Eyes closed;Foam/compliant surface;30 secs;Limitations;Other reps (comment)      Balance Exercises: Standing   Tandem Stance Limitations  on airex in modified tandem standing: EC no head movements for 30 sec's x 3 each foot forward, progressed to EC head movements left<>right, up<>down x 10 reps. no UE support with occasional touch to chair/walls, min to mod assist.     SLS with Vectors Limitations  ant/post direction on balance board with 2 tall cones in front: alternating fwd toe taps x 10 each side, then alternating cross toe taps x 10 each side. none to light UE touch to bars for balance. cues on posture, stance, and weight shifitng to assist. min to mod assist needed for balance.       Rebounder Limitations  performed both ways on balance board with no UE support/occasional touch for balance: Rocking board with emphasis on tall posture with EO, progressing to EC;  holding board steady for EC no head movements, progressing to EC head movements left<>right, up<>down. Min assist at times needed with cues on posture and weight shifting to assist with balance corrections.                          PT Short Term Goals - 12/17/17 1643      PT SHORT TERM GOAL #1   Title  Pt will participate in FGA assessment    Baseline  FGA=11/30    Time  4    Period  Weeks  Status  Achieved      PT SHORT TERM GOAL #2   Title  Pt will improve gait velocity to >/= 2.5 ft/sec with increase in stride length, R stance    Baseline  3.2 ft/sec    Time  4    Period  Weeks    Status  Achieved      PT SHORT TERM GOAL #3   Title  Pt will decrease falls risk as indicated by increase in BERG by 4 points    Baseline  52/56    Time  4    Period  Weeks    Status  Achieved        PT Long Term Goals - 12/17/17 1644      PT LONG TERM GOAL #1   Title  Pt will be independent with HEP and will transition back to community classes at [place]    Time  8    Period  Weeks    Status  New    Target Date  01/19/18      PT LONG TERM GOAL #2   Title  Pt will improve FGA by 4 points from initial assessment    Baseline  11/30 baseline on 11/28/2017    Time  8    Period  Weeks    Status  New    Target Date  01/19/18      PT LONG TERM GOAL #3   Title  Pt will improve BERG to >/= 54/56    Baseline  52/56 at 4 weeks    Time  8    Period  Weeks    Status  Revised    Target Date  01/19/18      PT LONG TERM GOAL #4   Title  Pt will improve gait velocity to >/= 3.6 ft/sec     Baseline  3.2 at 4 weeks    Time  8    Period  Weeks    Status  Revised    Target Date  01/19/18      PT LONG TERM GOAL #5   Title  Pt will improve LE strength to 5/5 bilaterally     Baseline  4-/5 hip flexion, RLE slightly decreased in strength to LLE    Time  8    Period  Weeks    Status  New    Target Date  01/19/18            Plan - 12/26/17 1322    Clinical Impression Statement   Today's skilled session continued to focus on balance reactions with emphasis on narrowed base of support and compliant surfaces. Pt is making steady progress toward goals, no increase in pain reported with session. Pt is progressing toward goals and should benefit from continued PT to progress toward unmet goals    PT Treatment/Interventions  ADLs/Self Care Home Management;Gait training;Stair training;Functional mobility training;Therapeutic activities;Therapeutic exercise;Balance training;Neuromuscular re-education;Patient/family education;Vestibular    PT Next Visit Plan  continue to focus on SLS/tandem, continue to upgrade HEP    Consulted and Agree with Plan of Care  Patient       Patient will benefit from skilled therapeutic intervention in order to improve the following deficits and impairments:  Abnormal gait, Decreased balance, Decreased strength, Difficulty walking, Dizziness  Visit Diagnosis: Unsteadiness on feet  Other abnormalities of gait and mobility  Muscle weakness (generalized)  Dizziness and giddiness     Problem List Patient Active Problem List   Diagnosis Date Noted  .  Hematuria 10/10/2017  . Gait abnormality 02/13/2017  . UTI (urinary tract infection) 11/22/2016  . Vertigo 11/22/2016  . Hypokalemia 11/22/2016  . Urinary frequency 09/08/2016  . Claudication ([place]) 04/24/2016  . Bilateral carotid artery disease ([place]) 08/07/2014  . Peripheral arterial disease ([place]) 08/06/2013  . GASTROPARESIS 02/25/2009  . COLONIC POLYPS, ADENOMATOUS, HX OF 02/22/2009  . HYPERCHOLESTEROLEMIA 09/26/2007  . Essential hypertension 09/26/2007  . Coronary atherosclerosis 09/26/2007  . ALLERGIC RHINITIS 09/26/2007  . GERD 09/26/2007  . Peptic ulcer 09/26/2007  . UMBILICAL HERNIA 02/72/5366  . NEPHROLITHIASIS 09/26/2007  . BLADDER STONE 09/26/2007    Willow Ora, PTA, [facility] [address], [place], [place] [address] [phone] 12/27/17,  12:31 PM   Name: Nathan Santiago MRN: [id number] Date of Birth: 03/28/1931

## 2017-12-27 DIAGNOSIS — M545 Low back pain: Secondary | ICD-10-CM | POA: Diagnosis not present

## 2017-12-28 ENCOUNTER — Encounter: Payer: Self-pay | Admitting: Physical Therapy

## 2017-12-28 ENCOUNTER — Ambulatory Visit: Payer: Medicare Other | Admitting: Physical Therapy

## 2017-12-28 DIAGNOSIS — R2681 Unsteadiness on feet: Secondary | ICD-10-CM

## 2017-12-28 DIAGNOSIS — R42 Dizziness and giddiness: Secondary | ICD-10-CM | POA: Diagnosis not present

## 2017-12-28 DIAGNOSIS — M6281 Muscle weakness (generalized): Secondary | ICD-10-CM | POA: Diagnosis not present

## 2017-12-28 DIAGNOSIS — R2689 Other abnormalities of gait and mobility: Secondary | ICD-10-CM | POA: Diagnosis not present

## 2017-12-28 NOTE — Therapy (Signed)
[facility] [address] [place], [place], [address] Phone: [phone]   Fax:  [phone]  Physical Therapy Treatment  Patient Details  Name: Nathan Santiago MRN: [id number] Date of Birth: November 20, 1930 Referring Provider: Ward Givens, NP; Kathrynn Ducking, MD   Encounter Date: 12/28/2017  PT End of Session - 12/28/17 1148    Visit Number  9    Number of Visits  17    Date for PT Re-Evaluation  01/19/18    Authorization Type  Medicare, AARP    PT Start Time  1102    PT Stop Time  1148    PT Time Calculation (min)  46 min    Activity Tolerance  Patient tolerated treatment well    Behavior During Therapy  [facility] for tasks assessed/performed       Past Medical History:  Diagnosis Date  . AAA (abdominal aortic aneurysm) (HCC)    asymptomatic  . AAA (abdominal aortic aneurysm) ([place]) 2010   peripheral  angiogram-- bilateral SFA DISEASE  and 60 to 70% infrarenaal abd. aortic stenosis with 15 -mm gradient  . Adenomatous colon polyp 11/2003  . CAD (coronary artery disease)   . Gait abnormality 02/13/2017  . Gastroparesis    pt denies  . GERD (gastroesophageal reflux disease)    w/ LPR  . History of kidney stones    x2  . Hyperlipidemia   . Hypertension   . Peripheral arterial disease ([place])    RCEA  by Dr Lemar Livings  . PUD (peptic ulcer disease)   . Vertigo     Past Surgical History:  Procedure Laterality Date  . AORTOGRAM  04/24/2016    Abdominal aortogram, bilateral iliac angiogram, bifemoral runoff  . CARDIAC CATHETERIZATION  05/12/2005   RCA  . carotid doppler  01/24/2013   RICA endarterectomy,left CCA 0-49%; left bulb and prox ICA 50-69%; bilaateral subclavian < 50%  . CAROTID ENDARTERECTOMY  11/01/2010  . CATARACT EXTRACTION    . CORONARY ANGIOPLASTY  05/18/2005   2 STENTS distal RCA AND PROXIMAL-MID RCA   5 total stents per pt.  . DOPPLER ECHOCARDIOGRAPHY  02/07/2012   EF 55%,SHOWED NO ISCHEMIA    . HERNIA REPAIR     umbilical  . lower arterial  doppler  02/04/2013   aotra 1.5 x 1.5 cm; distal abd aorta 70-99%,proximal common iliac arteries -very stenotic with increased velocities>50%,may be falsely elevated as a result of residual plaque from the distal aorta stenosis  . lower extremity doppler  June 18 ,2013   ABI'S ABNORMAL, RABI was 0.88 and LABI 0.75 ,with 3-vessel  run off  . NM MYOVIEW LTD  MAY [id number]   showed no significant ischemia;  . NM MYOVIEW LTD  04/22/2008   ef 77%,exercise capcity 6 METS ,exaggerated blood pressure response to exercise  . PERIPHERAL VASCULAR CATHETERIZATION N/A 04/24/2016   Procedure: Lower Extremity Angiography;  Surgeon: Lorretta Harp, MD;  Location: [facility];  Service: Cardiovascular;  Laterality: N/A;  . PERIPHERAL VASCULAR CATHETERIZATION  04/24/2016   Procedure: Peripheral Vascular Intervention;  Surgeon: Lorretta Harp, MD;  Location: [facility];  Service: Cardiovascular;;  Aorta  . retrograde central aortic catheterization  05/19/2005   cutting balloon atherectomy, c-circ stenosis with DES STENTING CYPHER  . TONSILLECTOMY    . TRANSURETHRAL RESECTION OF PROSTATE N/A 09/08/2016   Procedure: TRANSURETHRAL RESECTION OF THE PROSTATE (TURP);  Surgeon: Ardis Hughs, MD;  [facility];  Service: Urology;  Laterality: N/A;  . TRANSURETHRAL RESECTION OF PROSTATE     10-10-17  Dr. Louis Meckel  . TRANSURETHRAL RESECTION OF PROSTATE N/A 10/10/2017   Procedure: TRANSURETHRAL RESECTION OF THE PROSTATE (TURP);  Surgeon: Ardis Hughs, MD;  [facility];  Service: Urology;  Laterality: N/A;  . VASECTOMY      There were no vitals filed for this visit.  Subjective Assessment - 12/28/17 1106    Subjective  Back is feeling better; legs felt very sore after the last neuro PT session but improved today.    Pertinent History  AAA, CAD, HTN, PAD, and vertigo    Limitations  Walking    Patient Stated Goals  to regain his  balance and gains he made before in therapy    Currently in Pain?  No/denies    Pain Onset  More than a month ago                       [facility] Adult PT Treatment/Exercise - 12/28/17 1111      Ambulation/Gait   Curb  5: Supervision    Curb Details (indicate cue type and reason)  up/down solid curb from parking lot x 6 reps and between cars; also performed up/down from parking lot to grassy surface x 4 reps    Pre-Gait Activities  In compliant mulch and gravel performed high knee marching x 4 reps, side stepping L/R, forwards and retro stepping over threshold x 2 reps, retro gait in mulch x 4 reps with supervision-min A          Balance Exercises - 12/28/17 1132      Balance Exercises: Standing   Step Over Hurdles / Cones  on solid ground x 2 sets x 4 reps: forwards step to and then step through, retro and R/L laterally with supervision/min A          PT Education - 12/28/17 1147    Education provided  Yes    Education Details  decrease to 1x/week and begin attending exercise class at [employer]) Educated  Patient    Methods  Explanation    Comprehension  Verbalized understanding       PT Short Term Goals - 12/17/17 1643      PT SHORT TERM GOAL #1   Title  Pt will participate in FGA assessment    Baseline  FGA=11/30    Time  4    Period  Weeks    Status  Achieved      PT SHORT TERM GOAL #2   Title  Pt will improve gait velocity to >/= 2.5 ft/sec with increase in stride length, R stance    Baseline  3.2 ft/sec    Time  4    Period  Weeks    Status  Achieved      PT SHORT TERM GOAL #3   Title  Pt will decrease falls risk as indicated by increase in BERG by 4 points    Baseline  52/56    Time  4    Period  Weeks    Status  Achieved        PT Long Term Goals - 12/17/17 1644      PT LONG TERM GOAL #1   Title  Pt will be independent with HEP and will transition back to community classes at [place]    Time  8    Period  Weeks     Status  New    Target Date  01/19/18      PT LONG TERM GOAL #2   Title  Pt will improve FGA by 4 points from initial assessment    Baseline  11/30 baseline on 11/28/2017    Time  8    Period  Weeks    Status  New    Target Date  01/19/18      PT LONG TERM GOAL #3   Title  Pt will improve BERG to >/= 54/56    Baseline  52/56 at 4 weeks    Time  8    Period  Weeks    Status  Revised    Target Date  01/19/18      PT LONG TERM GOAL #4   Title  Pt will improve gait velocity to >/= 3.6 ft/sec     Baseline  3.2 at 4 weeks    Time  8    Period  Weeks    Status  Revised    Target Date  01/19/18      PT LONG TERM GOAL #5   Title  Pt will improve LE strength to 5/5 bilaterally     Baseline  4-/5 hip flexion, RLE slightly decreased in strength to LLE    Time  8    Period  Weeks    Status  New    Target Date  01/19/18            Plan - 12/28/17 1148    Clinical Impression Statement  Treatment session today focused on more functional tasks that include single leg stand including curbs, ambulating in compliant surfaces, and stepping over obstacles.  Patient demonstrated greatest difficulty with retro gait and retro stepping over obstacles.  Due to pt nearing D/C discussed decreasing neuro PT to one time a week to allow pt to continue with Ortho PT and transition to one wellness class at [facility].  Pt agreeable.  Will continue to progress towards LTG.    PT Treatment/Interventions  ADLs/Self Care Home Management;Gait training;Stair training;Functional mobility training;Therapeutic activities;Therapeutic exercise;Balance training;Neuromuscular re-education;Patient/family education;Vestibular    PT Next Visit Plan  decreased to 1x/week.  Continue to focus on functional SLS.    Consulted and Agree with Plan of Care  Patient       Patient will benefit from skilled therapeutic intervention in order to improve the following deficits and impairments:  Abnormal gait, Decreased balance, Decreased  strength, Difficulty walking, Dizziness  Visit Diagnosis: Unsteadiness on feet  Other abnormalities of gait and mobility  Muscle weakness (generalized)     Problem List Patient Active Problem List   Diagnosis Date Noted  . Hematuria 10/10/2017  . Gait abnormality 02/13/2017  . UTI (urinary tract infection) 11/22/2016  . Vertigo 11/22/2016  . Hypokalemia 11/22/2016  . Urinary frequency 09/08/2016  . Claudication ([place]) 04/24/2016  . Bilateral carotid artery disease ([place]) 08/07/2014  . Peripheral arterial disease ([place]) 08/06/2013  . GASTROPARESIS 02/25/2009  . COLONIC POLYPS, ADENOMATOUS, HX OF 02/22/2009  . HYPERCHOLESTEROLEMIA 09/26/2007  . Essential hypertension 09/26/2007  . Coronary atherosclerosis 09/26/2007  . ALLERGIC RHINITIS 09/26/2007  . GERD 09/26/2007  . Peptic ulcer 09/26/2007  . UMBILICAL HERNIA 57/84/6962  . NEPHROLITHIASIS 09/26/2007  . BLADDER STONE 09/26/2007    Rico Junker, PT, DPT 12/28/17    1:07 PM    [facility] [address] [place], [place], [address] Phone: [phone]   Fax:  [phone]  Name: Aaditya Letizia  Schuld MRN: [id number] Date of Birth: 28-Dec-1930

## 2017-12-31 ENCOUNTER — Other Ambulatory Visit: Payer: Self-pay | Admitting: Urology

## 2017-12-31 ENCOUNTER — Ambulatory Visit: Payer: Medicare Other | Admitting: Physical Therapy

## 2018-01-01 DIAGNOSIS — M545 Low back pain: Secondary | ICD-10-CM | POA: Diagnosis not present

## 2018-01-03 DIAGNOSIS — N201 Calculus of ureter: Secondary | ICD-10-CM | POA: Diagnosis not present

## 2018-01-04 ENCOUNTER — Encounter: Payer: Self-pay | Admitting: Physical Therapy

## 2018-01-04 ENCOUNTER — Ambulatory Visit: Payer: Medicare Other | Attending: Adult Health | Admitting: Physical Therapy

## 2018-01-04 DIAGNOSIS — M6281 Muscle weakness (generalized): Secondary | ICD-10-CM

## 2018-01-04 DIAGNOSIS — R2689 Other abnormalities of gait and mobility: Secondary | ICD-10-CM | POA: Diagnosis not present

## 2018-01-04 DIAGNOSIS — R42 Dizziness and giddiness: Secondary | ICD-10-CM

## 2018-01-04 DIAGNOSIS — R2681 Unsteadiness on feet: Secondary | ICD-10-CM

## 2018-01-04 NOTE — Therapy (Signed)
[facility] [address] [place], [place], [address] Phone: [phone]   Fax:  [phone]  Physical Therapy Treatment and D/C Summary  Patient Details  Name: Nathan Santiago MRN: [id number] Date of Birth: Nov 24, 1930 Referring Provider: Ward Givens, NP; Kathrynn Ducking, MD   Encounter Date: 01/04/2018  PT End of Session - 01/04/18 1059    Visit Number  10    Number of Visits  17    Date for PT Re-Evaluation  01/19/18    Authorization Type  Medicare, A[employer]    PT Start Time  8325    PT Stop Time  1048    PT Time Calculation (min)  34 min    Activity Tolerance  Patient tolerated treatment well    Behavior During Therapy  [facility] for tasks assessed/performed       Past Medical History:  Diagnosis Date  . AAA (abdominal aortic aneurysm) (HCC)    asymptomatic  . AAA (abdominal aortic aneurysm) ([place]) 2010   peripheral  angiogram-- bilateral SFA DISEASE  and 60 to 70% infrarenaal abd. aortic stenosis with 15 -mm gradient  . Adenomatous colon polyp 11/2003  . CAD (coronary artery disease)   . Gait abnormality 02/13/2017  . Gastroparesis    pt denies  . GERD (gastroesophageal reflux disease)    w/ LPR  . History of kidney stones    x2  . Hyperlipidemia   . Hypertension   . Peripheral arterial disease ([place])    RCEA  by Dr Lemar Livings  . PUD (peptic ulcer disease)   . Vertigo     Past Surgical History:  Procedure Laterality Date  . AORTOGRAM  04/24/2016    Abdominal aortogram, bilateral iliac angiogram, bifemoral runoff  . CARDIAC CATHETERIZATION  05/12/2005   RCA  . carotid doppler  01/24/2013   RICA endarterectomy,left CCA 0-49%; left bulb and prox ICA 50-69%; bilaateral subclavian < 50%  . CAROTID ENDARTERECTOMY  11/01/2010  . CATARACT EXTRACTION    . CORONARY ANGIOPLASTY  05/18/2005   2 STENTS distal RCA AND PROXIMAL-MID RCA   5 total stents per pt.  . DOPPLER ECHOCARDIOGRAPHY  02/07/2012   EF  55%,SHOWED NO ISCHEMIA   . HERNIA REPAIR     umbilical  . lower arterial  doppler  02/04/2013   aotra 1.5 x 1.5 cm; distal abd aorta 70-99%,proximal common iliac arteries -very stenotic with increased velocities>50%,may be falsely elevated as a result of residual plaque from the distal aorta stenosis  . lower extremity doppler  June 18 ,2013   ABI'S ABNORMAL, RABI was 0.88 and LABI 0.75 ,with 3-vessel  run off  . NM MYOVIEW LTD  MAY [id number]   showed no significant ischemia;  . NM MYOVIEW LTD  04/22/2008   ef 77%,exercise capcity 6 METS ,exaggerated blood pressure response to exercise  . PERIPHERAL VASCULAR CATHETERIZATION N/A 04/24/2016   Procedure: Lower Extremity Angiography;  Surgeon: Lorretta Harp, MD;  Location: [facility];  Service: Cardiovascular;  Laterality: N/A;  . PERIPHERAL VASCULAR CATHETERIZATION  04/24/2016   Procedure: Peripheral Vascular Intervention;  Surgeon: Lorretta Harp, MD;  Location: [facility];  Service: Cardiovascular;;  Aorta  . retrograde central aortic catheterization  05/19/2005   cutting balloon atherectomy, c-circ stenosis with DES STENTING CYPHER  . TONSILLECTOMY    . TRANSURETHRAL RESECTION OF PROSTATE N/A 09/08/2016   Procedure: TRANSURETHRAL RESECTION OF THE PROSTATE (TURP);  Surgeon: Ardis Hughs, MD;  Location: [facility];  Service: Urology;  Laterality: N/A;  . TRANSURETHRAL RESECTION OF PROSTATE     10-10-17  Dr. Louis Meckel  . TRANSURETHRAL RESECTION OF PROSTATE N/A 10/10/2017   Procedure: TRANSURETHRAL RESECTION OF THE PROSTATE (TURP);  Surgeon: Ardis Hughs, MD;  Location: [facility];  Service: Urology;  Laterality: N/A;  . VASECTOMY      There were no vitals filed for this visit.  Subjective Assessment - 01/04/18 1018    Subjective  No longer going to orthopedic PT; pain was likely related to kidney stone which pt will have removed in a week.  Would like to finish up today with neuro PT.    Pertinent History  AAA, CAD, HTN,  PAD, and vertigo    Limitations  Walking    Patient Stated Goals  to regain his balance and gains he made before in therapy    Currently in Pain?  No/denies         [facility] PT Assessment - 01/04/18 1022      Strength   Overall Strength Comments  5/5 bilat LE       Standardized Balance Assessment   Standardized Balance Assessment  Berg Balance Test;10 meter walk test    10 Meter Walk  11 seconds or 2.9 ft/sec      Berg Balance Test   Sit to Stand  Able to stand without using hands and stabilize independently    Standing Unsupported  Able to stand safely 2 minutes    Sitting with Back Unsupported but Feet Supported on Floor or Stool  Able to sit safely and securely 2 minutes    Stand to Sit  Sits safely with minimal use of hands    Transfers  Able to transfer safely, minor use of hands    Standing Unsupported with Eyes Closed  Able to stand 10 seconds safely    Standing Ubsupported with Feet Together  Able to place feet together independently and stand 1 minute safely    From Standing, Reach Forward with Outstretched Arm  Can reach confidently >25 cm (10")    From Standing Position, Pick up Object from Floor  Able to pick up shoe safely and easily    From Standing Position, Turn to Look Behind Over each Shoulder  Looks behind from both sides and weight shifts well    Turn 360 Degrees  Able to turn 360 degrees safely in 4 seconds or less    Standing Unsupported, Alternately Place Feet on Step/Stool  Able to stand independently and safely and complete 8 steps in 20 seconds    Standing Unsupported, One Foot in Front  Able to plae foot ahead of the other independently and hold 30 seconds    Standing on One Leg  Able to lift leg independently and hold 5-10 seconds    Total Score  54    Berg comment:  54/56      Functional Gait  Assessment   Gait assessed   Yes    Gait Level Surface  Walks 20 ft in less than 7 sec but greater than 5.5 sec, uses assistive device, slower speed, mild gait  deviations, or deviates 6-10 in outside of the 12 in walkway width.    Change in Gait Speed  Able to change speed, demonstrates mild gait deviations, deviates 6-10 in outside of the 12 in walkway width, or no gait deviations, unable to achieve a major change in velocity, or uses a change in velocity, or uses an assistive device.  Gait with Horizontal Head Turns  Performs head turns smoothly with slight change in gait velocity (eg, minor disruption to smooth gait path), deviates 6-10 in outside 12 in walkway width, or uses an assistive device.    Gait with Vertical Head Turns  Performs task with slight change in gait velocity (eg, minor disruption to smooth gait path), deviates 6 - 10 in outside 12 in walkway width or uses assistive device    Gait and Pivot Turn  Pivot turns safely in greater than 3 sec and stops with no loss of balance, or pivot turns safely within 3 sec and stops with mild imbalance, requires small steps to catch balance.    Step Over Obstacle  Is able to step over 2 stacked shoe boxes taped together (9 in total height) without changing gait speed. No evidence of imbalance.    Gait with Narrow Base of Support  Is able to ambulate for 10 steps heel to toe with no staggering.    Gait with Eyes Closed  Walks 20 ft, slow speed, abnormal gait pattern, evidence for imbalance, deviates 10-15 in outside 12 in walkway width. Requires more than 9 sec to ambulate 20 ft.    Ambulating Backwards  Walks 20 ft, uses assistive device, slower speed, mild gait deviations, deviates 6-10 in outside 12 in walkway width.    Steps  Alternating feet, must use rail.    Total Score  21    FGA comment:  21/30                           PT Education - 01/04/18 1045    Education provided  Yes    Education Details  D/C today; progress made, natural decline in balance with age and importance of maintaining activity level to challenge and maintain balance, strength, ROM.  Advised pt to  continue HEP until procedure and then after procedure gradually return to HEP and wellness classes as advised by physician.  Reviewed calf stretch in standing given to pt in 03/2017    Person(s) Educated  Patient    Methods  Explanation    Comprehension  Verbalized understanding       PT Short Term Goals - 12/17/17 1643      PT SHORT TERM GOAL #1   Title  Pt will participate in FGA assessment    Baseline  FGA=11/30    Time  4    Period  Weeks    Status  Achieved      PT SHORT TERM GOAL #2   Title  Pt will improve gait velocity to >/= 2.5 ft/sec with increase in stride length, R stance    Baseline  3.2 ft/sec    Time  4    Period  Weeks    Status  Achieved      PT SHORT TERM GOAL #3   Title  Pt will decrease falls risk as indicated by increase in BERG by 4 points    Baseline  52/56    Time  4    Period  Weeks    Status  Achieved        PT Long Term Goals - 01/04/18 1020      PT LONG TERM GOAL #1   Title  Pt will be independent with HEP and will transition back to community classes at [facility]  has not been able to go due to CT scans/blood work related to  kidney stone; will return to exercise class after surgery for kidney stone.  Independent with HEP    Time  8    Period  Weeks    Status  Partially Met      PT LONG TERM GOAL #2   Title  Pt will improve FGA by 4 points from initial assessment    Baseline  21/30    Time  8    Period  Weeks    Status  Achieved      PT LONG TERM GOAL #3   Title  Pt will improve BERG to >/= 54/56    Baseline  54/56    Time  8    Period  Weeks    Status  Achieved      PT LONG TERM GOAL #4   Title  Pt will improve gait velocity to >/= 3.6 ft/sec     Baseline  3-3.2 ft/sec    Time  8    Period  Weeks    Status  Not Met      PT LONG TERM GOAL #5   Title  Pt will improve LE strength to 5/5 bilaterally     Baseline  5/5     Time  8    Period  Weeks    Status  Achieved            Plan - 01/04/18 1059     Clinical Impression Statement  Pt is pleased with his progress and improvements in balance and decrease in dizziness and veering.  Pt would like to complete neuro PT today in order to focus on upcoming kidney procedure and then will return to HEP and wellness classes once cleared.  Pt has made good progress towards goals; pt has met all but 1 LTG - gait velocity did not increase to goal level but did improve overall from evaluation.  Pt has also demonstrated improvements in standing balance, dynamic balance during gait and reports decreased dizziness.  Pt is independent with current HEP and ready for D/C.      PT Treatment/Interventions  ADLs/Self Care Home Management;Gait training;Stair training;Functional mobility training;Therapeutic activities;Therapeutic exercise;Balance training;Neuromuscular re-education;Patient/family education;Vestibular    PT Next Visit Plan  D/C    Consulted and Agree with Plan of Care  Patient       Patient will benefit from skilled therapeutic intervention in order to improve the following deficits and impairments:  Abnormal gait, Decreased balance, Decreased strength, Difficulty walking, Dizziness  Visit Diagnosis: Unsteadiness on feet  Other abnormalities of gait and mobility  Muscle weakness (generalized)  Dizziness and giddiness     Problem List Patient Active Problem List   Diagnosis Date Noted  . Hematuria 10/10/2017  . Gait abnormality 02/13/2017  . UTI (urinary tract infection) 11/22/2016  . Vertigo 11/22/2016  . Hypokalemia 11/22/2016  . Urinary frequency 09/08/2016  . Claudication ([place]) 04/24/2016  . Bilateral carotid artery disease ([place]) 08/07/2014  . Peripheral arterial disease ([place]) 08/06/2013  . GASTROPARESIS 02/25/2009  . COLONIC POLYPS, ADENOMATOUS, HX OF 02/22/2009  . HYPERCHOLESTEROLEMIA 09/26/2007  . Essential hypertension 09/26/2007  . Coronary atherosclerosis 09/26/2007  . ALLERGIC RHINITIS 09/26/2007  . GERD 09/26/2007  .  Peptic ulcer 09/26/2007  . UMBILICAL HERNIA 26/71/2458  . NEPHROLITHIASIS 09/26/2007  . BLADDER STONE 09/26/2007   PHYSICAL THERAPY DISCHARGE SUMMARY  Visits from Start of Care: 10  Current functional level related to goals / functional outcomes: Please see impression statement and LTG   Remaining deficits: Impaired balance  Education / Equipment: HEP  Plan: Patient agrees to discharge.  Patient goals were met. Patient is being discharged due to meeting the stated rehab goals.  ?????     Rico Junker, PT, DPT 01/04/18    11:05 AM   [facility] [address] [place], [place], [address] Phone: [phone]   Fax:  [phone]  Name: JAVYON FONTAN MRN: [id number] Date of Birth: Nov 25, 1930

## 2018-01-07 ENCOUNTER — Encounter (HOSPITAL_COMMUNITY): Payer: Self-pay

## 2018-01-07 ENCOUNTER — Ambulatory Visit: Payer: Medicare Other | Admitting: Physical Therapy

## 2018-01-07 NOTE — Pre-Procedure Instructions (Signed)
The following are in epic: EKG 05/23/2017 Vascular studies 12/19/2017

## 2018-01-07 NOTE — Patient Instructions (Addendum)
Your procedure is scheduled on: Friday, January 11, 2018   Surgery Time:  7:30AM-9:00AM   Report to [facility] Main  Entrance    Report to admitting at 5:30 AM   Call this number if you have problems the morning of surgery [phone]   Do not eat food or drink liquids :After Midnight.   Do NOT smoke after Midnight   Take these medicines the morning of surgery with A SIP OF WATER: Amlodipine, Atenolol, Cilostazol, Finasteride, Diazepam if needed                               You may not have any metal on your body including  jewelry, and body piercings             Do not wear lotions, powders, perfumes/cologne, or deodorant                           Men may shave face and neck.   Do not bring valuables to the hospital. [facility].   Contacts, dentures or bridgework may not be worn into surgery.    Patients discharged the day of surgery will not be allowed to drive home.   Special Instructions: Bring a copy of your healthcare power of attorney and living will documents         the day of surgery if you haven't scanned them in before.              Please read over the following fact sheets you were given:  [facility] - Preparing for Surgery Before surgery, you can play an important role.  Because skin is not sterile, your skin needs to be as free of germs as possible.  You can reduce the number of germs on your skin by washing with CHG (chlorahexidine gluconate) soap before surgery.  CHG is an antiseptic cleaner which kills germs and bonds with the skin to continue killing germs even after washing. Please DO NOT use if you have an allergy to CHG or antibacterial soaps.  If your skin becomes reddened/irritated stop using the CHG and inform your nurse when you arrive at Short Stay. Do not shave (including legs and underarms) for at least 48 hours prior to the first CHG shower.  You may shave your face/neck.  Please follow  these instructions carefully:  1.  Shower with CHG Soap the night before surgery and the  morning of surgery.  2.  If you choose to wash your hair, wash your hair first as usual with your normal  shampoo.  3.  After you shampoo, rinse your hair and body thoroughly to remove the shampoo.                             4.  Use CHG as you would any other liquid soap.  You can apply chg directly to the skin and wash.  Gently with a scrungie or clean washcloth.  5.  Apply the CHG Soap to your body ONLY FROM THE NECK DOWN.   Do   not use on face/ open  Wound or open sores. Avoid contact with eyes, ears mouth and   genitals (private parts).                       Wash face,  Genitals (private parts) with your normal soap.             6.  Wash thoroughly, paying special attention to the area where your    surgery  will be performed.  7.  Thoroughly rinse your body with warm water from the neck down.  8.  DO NOT shower/wash with your normal soap after using and rinsing off the CHG Soap.                9.  Pat yourself dry with a clean towel.            10.  Wear clean pajamas.            11.  Place clean sheets on your bed the night of your first shower and do not  sleep with pets. Day of Surgery : Do not apply any lotions/deodorants the morning of surgery.  Please wear clean clothes to the hospital/surgery center.  FAILURE TO FOLLOW THESE INSTRUCTIONS MAY RESULT IN THE CANCELLATION OF YOUR SURGERY  PATIENT SIGNATURE_________________________________  NURSE SIGNATURE__________________________________  ________________________________________________________________________

## 2018-01-08 ENCOUNTER — Other Ambulatory Visit: Payer: Self-pay

## 2018-01-08 ENCOUNTER — Encounter (HOSPITAL_COMMUNITY): Payer: Self-pay

## 2018-01-08 ENCOUNTER — Encounter (HOSPITAL_COMMUNITY)
Admission: RE | Admit: 2018-01-08 | Discharge: 2018-01-08 | Disposition: A | Discharge status: Home / Self Care | Payer: Medicare Other | Source: Ambulatory Visit | Attending: Urology | Admitting: Urology

## 2018-01-08 DIAGNOSIS — R310 Gross hematuria: Secondary | ICD-10-CM | POA: Diagnosis not present

## 2018-01-08 DIAGNOSIS — D649 Anemia, unspecified: Secondary | ICD-10-CM | POA: Diagnosis not present

## 2018-01-08 DIAGNOSIS — I10 Essential (primary) hypertension: Secondary | ICD-10-CM | POA: Diagnosis not present

## 2018-01-08 DIAGNOSIS — Z01812 Encounter for preprocedural laboratory examination: Secondary | ICD-10-CM

## 2018-01-08 DIAGNOSIS — Z87891 Personal history of nicotine dependence: Secondary | ICD-10-CM | POA: Diagnosis not present

## 2018-01-08 DIAGNOSIS — N201 Calculus of ureter: Secondary | ICD-10-CM

## 2018-01-08 DIAGNOSIS — I2510 Atherosclerotic heart disease of native coronary artery without angina pectoris: Secondary | ICD-10-CM | POA: Diagnosis not present

## 2018-01-08 DIAGNOSIS — K219 Gastro-esophageal reflux disease without esophagitis: Secondary | ICD-10-CM | POA: Diagnosis not present

## 2018-01-08 DIAGNOSIS — I739 Peripheral vascular disease, unspecified: Secondary | ICD-10-CM | POA: Diagnosis not present

## 2018-01-08 DIAGNOSIS — Z79899 Other long term (current) drug therapy: Secondary | ICD-10-CM | POA: Diagnosis not present

## 2018-01-08 DIAGNOSIS — Z7982 Long term (current) use of aspirin: Secondary | ICD-10-CM | POA: Diagnosis not present

## 2018-01-08 LAB — CBC
HEMATOCRIT: 38.6 % — AB (ref 39.0–52.0)
HEMOGLOBIN: 12.7 g/dL — AB (ref 13.0–17.0)
MCH: 28.9 pg (ref 26.0–34.0)
MCHC: 32.9 g/dL (ref 30.0–36.0)
MCV: 87.7 fL (ref 78.0–100.0)
Platelets: 236 10*3/uL (ref 150–400)
RBC: 4.4 MIL/uL (ref 4.22–5.81)
RDW: 12.3 % (ref 11.5–15.5)
WBC: 7 10*3/uL (ref 4.0–10.5)

## 2018-01-08 LAB — BASIC METABOLIC PANEL
ANION GAP: 6 (ref 5–15)
BUN: 22 mg/dL — AB (ref 6–20)
CHLORIDE: 107 mmol/L (ref 101–111)
CO2: 31 mmol/L (ref 22–32)
Calcium: 9.6 mg/dL (ref 8.9–10.3)
Creatinine, Ser: 0.95 mg/dL (ref 0.61–1.24)
GFR calc Af Amer: >60 mL/min (ref >60)
Glucose, Bld: 144 mg/dL — ABNORMAL HIGH (ref 65–99)
POTASSIUM: 4.7 mmol/L (ref 3.5–5.1)
Sodium: 144 mmol/L (ref 135–145)

## 2018-01-08 NOTE — Pre-Procedure Instructions (Signed)
CBC and BMP results 01/08/2018 faxed to Dr. Louis Meckel via epic.

## 2018-01-10 ENCOUNTER — Encounter: Payer: Medicare Other | Admitting: Physical Therapy

## 2018-01-11 ENCOUNTER — Ambulatory Visit (HOSPITAL_COMMUNITY): Payer: Medicare Other

## 2018-01-11 ENCOUNTER — Encounter (HOSPITAL_COMMUNITY)
Admission: RE | Disposition: A | Discharge status: Home / Self Care | Payer: Self-pay | Source: Ambulatory Visit | Attending: Urology

## 2018-01-11 ENCOUNTER — Ambulatory Visit (HOSPITAL_COMMUNITY): Payer: Medicare Other | Admitting: Certified Registered"

## 2018-01-11 ENCOUNTER — Ambulatory Visit (HOSPITAL_COMMUNITY)
Admission: RE | Admit: 2018-01-11 | Discharge: 2018-01-11 | Disposition: A | Discharge status: Home / Self Care | Payer: Medicare Other | Source: Ambulatory Visit | Attending: Urology | Admitting: Urology

## 2018-01-11 ENCOUNTER — Encounter (HOSPITAL_COMMUNITY): Payer: Self-pay | Admitting: Emergency Medicine

## 2018-01-11 DIAGNOSIS — Z7982 Long term (current) use of aspirin: Secondary | ICD-10-CM | POA: Insufficient documentation

## 2018-01-11 DIAGNOSIS — R310 Gross hematuria: Secondary | ICD-10-CM | POA: Diagnosis not present

## 2018-01-11 DIAGNOSIS — D649 Anemia, unspecified: Secondary | ICD-10-CM | POA: Insufficient documentation

## 2018-01-11 DIAGNOSIS — Z79899 Other long term (current) drug therapy: Secondary | ICD-10-CM | POA: Insufficient documentation

## 2018-01-11 DIAGNOSIS — I2510 Atherosclerotic heart disease of native coronary artery without angina pectoris: Secondary | ICD-10-CM | POA: Insufficient documentation

## 2018-01-11 DIAGNOSIS — I10 Essential (primary) hypertension: Secondary | ICD-10-CM | POA: Diagnosis not present

## 2018-01-11 DIAGNOSIS — K219 Gastro-esophageal reflux disease without esophagitis: Secondary | ICD-10-CM | POA: Diagnosis not present

## 2018-01-11 DIAGNOSIS — I739 Peripheral vascular disease, unspecified: Secondary | ICD-10-CM | POA: Insufficient documentation

## 2018-01-11 DIAGNOSIS — N201 Calculus of ureter: Secondary | ICD-10-CM | POA: Diagnosis not present

## 2018-01-11 DIAGNOSIS — Z87891 Personal history of nicotine dependence: Secondary | ICD-10-CM | POA: Insufficient documentation

## 2018-01-11 HISTORY — PX: CYSTOSCOPY/URETEROSCOPY/HOLMIUM LASER/STENT PLACEMENT: SHX6546

## 2018-01-11 SURGERY — CYSTOSCOPY/URETEROSCOPY/HOLMIUM LASER/STENT PLACEMENT
Anesthesia: General | Site: Ureter | Laterality: Left

## 2018-01-11 MED ORDER — DEXAMETHASONE SODIUM PHOSPHATE 10 MG/ML IJ SOLN
INTRAMUSCULAR | Status: AC
  Filled 2018-01-11: qty 1

## 2018-01-11 MED ORDER — PROPOFOL 10 MG/ML IV BOLUS
INTRAVENOUS | Status: AC
Start: 2018-01-11 — End: ?
  Filled 2018-01-11: qty 20

## 2018-01-11 MED ORDER — OXYCODONE HCL 5 MG/5ML PO SOLN
5.0000 mg | Freq: Once | ORAL | Status: AC | PRN
  Filled 2018-01-11: qty 5

## 2018-01-11 MED ORDER — OXYCODONE HCL 5 MG PO TABS
5.0000 mg | ORAL_TABLET | Freq: Once | ORAL | Status: AC | PRN
  Administered 2018-01-11: 5 mg via ORAL

## 2018-01-11 MED ORDER — OXYCODONE HCL 5 MG PO TABS
ORAL_TABLET | ORAL | Status: AC
  Filled 2018-01-11: qty 1

## 2018-01-11 MED ORDER — SODIUM CHLORIDE 0.9 % IR SOLN
Status: DC | PRN
  Administered 2018-01-11: 3000 mL via INTRAVESICAL

## 2018-01-11 MED ORDER — FENTANYL CITRATE (PF) 100 MCG/2ML IJ SOLN
25.0000 ug | INTRAMUSCULAR | Status: AC | PRN
  Administered 2018-01-11 (×6): 25 ug via INTRAVENOUS

## 2018-01-11 MED ORDER — HYDRALAZINE HCL 20 MG/ML IJ SOLN
5.0000 mg | Freq: Once | INTRAMUSCULAR | Status: AC
  Administered 2018-01-11: 5 mg via INTRAVENOUS

## 2018-01-11 MED ORDER — ONDANSETRON HCL 4 MG/2ML IJ SOLN
INTRAMUSCULAR | Status: AC
  Filled 2018-01-11: qty 2

## 2018-01-11 MED ORDER — LACTATED RINGERS IV SOLN
INTRAVENOUS | Status: DC
  Administered 2018-01-11 (×2): via INTRAVENOUS

## 2018-01-11 MED ORDER — BELLADONNA ALKALOIDS-OPIUM 16.2-60 MG RE SUPP
RECTAL | Status: DC | PRN
  Administered 2018-01-11: 1 via RECTAL

## 2018-01-11 MED ORDER — HYDRALAZINE HCL 20 MG/ML IJ SOLN
INTRAMUSCULAR | Status: AC
  Administered 2018-01-11: 5 mg via INTRAVENOUS
  Filled 2018-01-11: qty 1

## 2018-01-11 MED ORDER — DEXAMETHASONE SODIUM PHOSPHATE 10 MG/ML IJ SOLN
INTRAMUSCULAR | Status: DC | PRN
  Administered 2018-01-11: 10 mg via INTRAVENOUS

## 2018-01-11 MED ORDER — FENTANYL CITRATE (PF) 100 MCG/2ML IJ SOLN
INTRAMUSCULAR | Status: AC
  Administered 2018-01-11: 25 ug via INTRAVENOUS
  Filled 2018-01-11: qty 2

## 2018-01-11 MED ORDER — LIDOCAINE 2% (20 MG/ML) 5 ML SYRINGE
INTRAMUSCULAR | Status: DC | PRN
  Administered 2018-01-11: 100 mg via INTRAVENOUS

## 2018-01-11 MED ORDER — ONDANSETRON HCL 4 MG/2ML IJ SOLN
INTRAMUSCULAR | Status: DC | PRN
  Administered 2018-01-11: 4 mg via INTRAVENOUS

## 2018-01-11 MED ORDER — CIPROFLOXACIN HCL 500 MG PO TABS: 500.0000 mg | ORAL_TABLET | Freq: Once | ORAL | 0 refills | Status: AC

## 2018-01-11 MED ORDER — PROPOFOL 10 MG/ML IV BOLUS: INTRAVENOUS | Status: DC | PRN

## 2018-01-11 MED ORDER — EPHEDRINE SULFATE-NACL 50-0.9 MG/10ML-% IV SOSY
PREFILLED_SYRINGE | INTRAVENOUS | Status: DC | PRN
  Administered 2018-01-11: 10 mg via INTRAVENOUS

## 2018-01-11 MED ORDER — PROMETHAZINE HCL 25 MG/ML IJ SOLN: 6.2500 mg | INTRAMUSCULAR | Status: DC | PRN

## 2018-01-11 MED ORDER — CEFAZOLIN SODIUM-DEXTROSE 2-4 GM/100ML-% IV SOLN
2.0000 g | INTRAVENOUS | Status: AC
  Administered 2018-01-11: 2 g via INTRAVENOUS
  Filled 2018-01-11: qty 100

## 2018-01-11 MED ORDER — TRAMADOL HCL 50 MG PO TABS: 50.0000 mg | ORAL_TABLET | Freq: Four times a day (QID) | ORAL | 0 refills | Status: DC | PRN

## 2018-01-11 MED ORDER — FENTANYL CITRATE (PF) 100 MCG/2ML IJ SOLN
INTRAMUSCULAR | Status: DC | PRN
  Administered 2018-01-11 (×2): 50 ug via INTRAVENOUS

## 2018-01-11 MED ORDER — BELLADONNA-OPIUM 16.2-30 MG RE SUPP
RECTAL | Status: AC
  Filled 2018-01-11: qty 1

## 2018-01-11 MED ORDER — IOHEXOL 300 MG/ML  SOLN
INTRAMUSCULAR | Status: DC | PRN
  Administered 2018-01-11: 17 mL via INTRAVENOUS

## 2018-01-11 MED ORDER — PHENAZOPYRIDINE HCL 200 MG PO TABS: 200.0000 mg | ORAL_TABLET | Freq: Three times a day (TID) | ORAL | 0 refills | Status: DC | PRN

## 2018-01-11 MED ORDER — PROPOFOL 10 MG/ML IV BOLUS
INTRAVENOUS | Status: DC | PRN
  Administered 2018-01-11: 160 mg via INTRAVENOUS

## 2018-01-11 MED ORDER — FENTANYL CITRATE (PF) 100 MCG/2ML IJ SOLN
INTRAMUSCULAR | Status: AC
  Filled 2018-01-11: qty 2

## 2018-01-11 SURGICAL SUPPLY — 22 items
BAG URO CATCHER STRL LF (MISCELLANEOUS) ×2 IMPLANT
BASKET ZERO TIP NITINOL 2.4FR (BASKET) IMPLANT
BENZOIN TINCTURE PRP APPL 2/3 (GAUZE/BANDAGES/DRESSINGS) ×2 IMPLANT
CATH URET 5FR 28IN OPEN ENDED (CATHETERS) ×2 IMPLANT
CLOTH BEACON ORANGE TIMEOUT ST (SAFETY) ×2 IMPLANT
COVER FOOTSWITCH UNIV (MISCELLANEOUS) IMPLANT
COVER SURGICAL LIGHT HANDLE (MISCELLANEOUS) ×2 IMPLANT
DRSG TEGADERM 4X4.75 (GAUZE/BANDAGES/DRESSINGS) ×2 IMPLANT
EXTRACTOR STONE 1.7FRX115CM (UROLOGICAL SUPPLIES) ×2 IMPLANT
FIBER LASER TRAC TIP (UROLOGICAL SUPPLIES) ×2 IMPLANT
GLOVE BIOGEL M STRL SZ7.5 (GLOVE) ×2 IMPLANT
GOWN STRL REUS W/TWL XL LVL3 (GOWN DISPOSABLE) ×2 IMPLANT
GUIDEWIRE ANG ZIPWIRE 038X150 (WIRE) IMPLANT
GUIDEWIRE STR DUAL SENSOR (WIRE) ×2 IMPLANT
MANIFOLD NEPTUNE II (INSTRUMENTS) ×2 IMPLANT
PACK CYSTO (CUSTOM PROCEDURE TRAY) ×2 IMPLANT
SHEATH URETERAL 12FRX28CM (UROLOGICAL SUPPLIES) ×2 IMPLANT
SHEATH URETERAL 12FRX35CM (MISCELLANEOUS) IMPLANT
STENT URET 6FRX24 CONTOUR (STENTS) ×2 IMPLANT
TUBING CONNECTING 10 (TUBING) ×2 IMPLANT
TUBING UROLOGY SET (TUBING) ×2 IMPLANT
WIRE COONS/BENSON .038X145CM (WIRE) IMPLANT

## 2018-01-11 NOTE — H&P (Signed)
gross hematuria evaluation  HPI: Nathan Santiago is a 84 year-old male established patient who is here for further evaluation of gross hematuria.[name]  He first noticed the blood 09/08/2016. He has noticed blood daily.   The patient has not had any recent abdominal imaging. He has been told that he has blood in his urine prior to this episode. The patient has had a previous hematuria evaluation.   The patient denies any progression of his voiding symptoms. The patient denies any new or recent onset of back/flank pain or suprapubic pain.   The patient does not have a history of recurrent UTIs. They have a history of kidney stones. He has not been exposed to occupational hazards that may increase their risks for developing cancer. The patient has no family history of GU malignancy. The patient is a former smoker.   05/28/17: The patient was seen today again for recurrent hematuria. This is been ongoing for the past 8 months. He continues on Plavix for his peripheral vascular disease as well as his coronary artery disease. He has several stents both in his heart and his iliac arteries. However, he is very frustrated with the fact that he continues to have some hematuria. I cleared it with Dr. Gwenlyn Found to stop his Plavix for an extended period of time to allow the bleeding to stop and all symptoms resolved.  08/02/17: Today, the patient reports no bleeding for the past month. He has been off of Plavix now for 5 weeks. He reports no additional voiding symptoms including urgency or frequency. He gets up at night once. Reports that he stopped his Plavix on June 26, 2017.  10/23/17: He underwent repeat TURP on 10/10/17 for persistent gross hematuria. He remains off his Plavix.   11/20/17: Patient remained off the Plavix for an extended period of time following his TURP. However, he restarted this about 1-2 weeks ago. He states that even off his Plavix, he continued to complain of coffee colored urine. Placed on  suppression abx.   Intv:  The patient continues to have hematuria. He has passed clots a few times since he was last seen. The urine is mostly dark and murky, brownish. He denies any dysuria. He denies any severe incontinence. He denies any significant progression of his lower urinary tract symptoms. He is getting up one time at night. He has stopped the Plavix at this point and has not been taking it for a while now. He has been on suppression antibiotics and this does not seem to make any difference in the bleeding.     ALLERGIES: Lisinopril TABS Niaspan TBCR    MEDICATIONS: Keflex 250 mg capsule 1 capsule PO Q HS  Amlodipine Besylate 5 mg tablet Oral  Aspirin 81 MG TABS Oral  Atenolol 50 MG Oral Tablet Oral  Biotin 2,500 mcg capsule Oral  Fish Oil 120 mg-180 mg capsule Oral  Hydrochlorothiazide 25 mg tablet Oral  Lipitor 20 mg tablet Oral  Magnesium Oxide 400 mg magnesium tablet Oral  Metoclopramide HCl - 5 MG Oral Tablet Oral  Multi-Day Vitamins TABS Oral  Omega 3 684 mg-1,200 mg capsule,delayed release Oral  Pantoprazole Sodium 40 mg tablet, delayed release 1 tablet PO Daily  Plavix 75 mg tablet Oral  Potassium Chloride 10 meq capsule, extended release Oral  Tramadol Hcl 50 mg tablet 0 Oral  Vegetarian Glucosamine 750 mg tablet Oral  Vitamin C TABS Oral  Vitamin D3 1000 UNIT Oral Tablet Oral  Vytorin 10-80 MG Oral Tablet Oral  GU PSH: Cystoscopy - 06/19/2017, 08/15/2016 Cystoscopy TURP - 09/08/2016 Remove Prostate Regrowth - 10/10/2017 TUMT - 2011      PSH Notes: Cataract Surgery, Surg Prostate Transureth Dest Tissue Microwave Thermotherapy, stent placed in artery of groin (2017)   NON-GU PSH: None   GU PMH: Gross hematuria - 11/20/2017, - 10/24/2017, - 09/25/2017, - 08/02/2017, - 06/19/2017, - 05/25/2017, - 05/11/2017, - 04/13/2017, - 12/04/2016, I suspect the patient has contracted a symptomatic urinary tract infection. This would be consistent with his back pain and the  dysuria he is experiencing. The hematuria correlates with this and him also being on Plavix., - 11/21/2016, - 10/04/2016 Incomplete bladder emptying - 05/11/2017 Hemorrhagic cystitis - 12/04/2016 Prostate Cancer (Stable), Low volume intermediate risk prostate cancer with a very low PSA. Likely clinically insignificant, we will continue to trend his PSA to ensure it stays clinically Insignificant. - 11/21/2016 Urinary Retention - 09/27/2016, - 09/15/2016 BPH w/LUTS - 08/15/2016, Benign prostatic hyperplasia with urinary obstruction, - 2017 History of urolithiasis, History of kidney stones - 2017 Renal and ureteral calculus, Calculus of kidney with calculus of ureter - 3903 Renal colic, Renal colic - 0092 Urinary Retention, Unspec, Incomplete bladder emptying - 2016 Urinary Urgency, Urinary urgency - 2016 Urge incontinence, Urge incontinence of urine - 2016 Urinary Tract Inf, Unspec site, Pyuria - 2014    NON-GU PMH: Encounter for general adult medical examination without abnormal findings, Encounter for preventive health examination - 2017 Personal history of other diseases of the circulatory system, History of cardiac disorder - 2016, History of hypertension, - 2014 Personal history of other diseases of the digestive system, History of esophageal reflux - 2016 Personal history of other endocrine, nutritional and metabolic disease, History of hypercholesterolemia - 2014    FAMILY HISTORY: Atherosclerosis - Runs In Family Death - Father, Mother Death of family member - [place] In [place] Status Number - Runs In Family nephrolithiasis - Runs In Family   SOCIAL HISTORY: Marital Status: Married Preferred Language: English; Ethnicity: Not Hispanic Or Latino; Race: White Current Smoking Status: Patient does not smoke anymore. Has not smoked since 08/31/1986.   Tobacco Use Assessment Completed: Used Tobacco in last 30 days? Drinks 2 drinks per week.  Does not drink caffeine. Patient's  occupation is/was Retired.     Notes: Former smoker, Alcohol Use, Marital History - Currently Married, Occupation:, Caffeine Use, Tobacco Use   REVIEW OF SYSTEMS:    GU Review Male:   Patient denies frequent urination, hard to postpone urination, burning/ pain with urination, get up at night to urinate, leakage of urine, stream starts and stops, trouble starting your stream, have to strain to urinate , erection problems, and penile pain.  Gastrointestinal (Upper):   Patient denies nausea, vomiting, and indigestion/ heartburn.  Gastrointestinal (Lower):   Patient denies constipation and diarrhea.  Constitutional:   Patient denies fever, night sweats, weight loss, and fatigue.  Skin:   Patient denies skin rash/ lesion and itching.  Eyes:   Patient denies blurred vision and double vision.  Ears/ Nose/ Throat:   Patient denies sore throat and sinus problems.  Hematologic/Lymphatic:   Patient denies swollen glands and easy bruising.  Cardiovascular:   Patient denies leg swelling and chest pains.  Respiratory:   Patient reports cough. Patient denies shortness of breath.  Endocrine:   Patient denies excessive thirst.  Musculoskeletal:   Patient reports back pain. Patient denies joint pain.  Neurological:   Patient denies headaches and dizziness.  Psychologic:   Patient  denies depression and anxiety.   VITAL SIGNS:      12/18/2017 10:03 AM  Weight 180 lb / 81.65 kg  Height 64 in / 162.56 cm  BP 164/68 mmHg  Heart Rate 82 /min  Temperature 98.1 F / 36.7 C  BMI 30.9 kg/m   PAST DATA REVIEWED:  Source Of History:  Patient  Records Review:   Previous Patient Records  X-Ray Review: MRI Lumbar Spine: Reviewed Films. Stone in the left renal pelvis, bilateral cystic lesions on the kidneys.    08/02/17 02/27/07 01/12/06 01/10/05 07/08/04 01/06/04 01/22/03  PSA  Total PSA 3.33 ng/mL 0.60  0.48  0.41  0.50  0.13  0.20     PROCEDURES:          Urinalysis w/Scope Dipstick Dipstick Cont'd Micro   Color: Amber Bilirubin: Neg WBC/hpf: 6 - 10/hpf  Appearance: Cloudy Ketones: Neg RBC/hpf: >60/hpf  Specific Gravity: 1.025 Blood: 3+ Bacteria: NS (Not Seen)  pH: 5.5 Protein: 1+ Cystals: NS (Not Seen)  Glucose: Neg Urobilinogen: 0.2 Casts: NS (Not Seen)    Nitrites: Neg Trichomonas: Not Present    Leukocyte Esterase: 1+ Mucous: Not Present      Epithelial Cells: NS (Not Seen)      Yeast: NS (Not Seen)      Sperm: Not Present    ASSESSMENT:      ICD-10 Details  1 GU:   Gross hematuria - R31.0      PLAN:            Medications New Meds: Finasteride 5 mg tablet 1 tablet PO Daily   #90  3 Refill(s)            Orders Labs BMP  X-Rays: C.T. Hematuria With and Without I.V. Contrast          Schedule         Document Letter(s):  Created for Patient: Clinical Summary         Notes:   The patient has had persistent prostatic bleeding following his surgery. Prior to this he was not having any gross hematuria or microscopic hematuria. We have not been able to get the bleeding stopped. Interestingly, the urine is always murky and quality. Suppressive antibiotics has not made any difference. Stopping the Plavix also was not working. I don't think he has a chronic infection that is leading to this. While I am quite sure that the prostate is the source of the bleeding, the findings of the cyst in the kidney and the kidney stone to peak my suspicion and I would like to rule out any upper tract pathology as well.   We discussed treatment options. I think at this time is for starting the patient on finasteride and seeing if it works. I'll plan to have him follow up in 3 months to see if there is any significant improvement in his gross hematuria. I have ordered a CT scan for the patient will contact him with the results.

## 2018-01-11 NOTE — Op Note (Signed)
Preoperative diagnosis:  1. Gross hematuria  Postoperative diagnosis:  1. Same 2. Left UPJ stone  Procedure: Cystoscopy, left retrograde pyelogram with interpretation Left ureteroscopy, laser lithotripsy stone basket extraction Left ureteral stent placement  Surgeon: Ardis Hughs, MD  Anesthesia: General  Complications: None  Intraoperative findings: The patient's retrograde pyelogram demonstrated a normal caliber ureter no appreciable filling defects.  In the renal pelvis there was a 1 cm filling defect consistent with the patient's known stone.  There were no other abnormalities.  EBL: Minimal  Specimens: Renal pelvis stone taken to the [facility] for further stone composition analysis  Indication: Nathan Santiago is a 84 y.o. patient with gross hematuria, work-up demonstrated a left UPJ stone which is likely the etiology of his hematuria.  After reviewing the management options for treatment, he elected to proceed with the above surgical procedure(s). We have discussed the potential benefits and risks of the procedure, side effects of the proposed treatment, the likelihood of the patient achieving the goals of the procedure, and any potential problems that might occur during the procedure or recuperation. Informed consent has been obtained.  Description of procedure:  The patient was taken to the operating room and general anesthesia was induced.  The patient was placed in the dorsal lithotomy position, prepped and draped in the usual sterile fashion, and preoperative antibiotics were administered. A preoperative time-out was performed.   A 30 degree 21 French cystoscope was then gently passed to the patient's urethra into the bladder under visual guidance.  360 degrees cystoscopic evaluation was performed noting orthotopic ureters with no significant bladder abnormalities.  The prostate fossa was widely patent without any evidence of significant  prostatic regrowth.  A 5 French open-ended ureteral catheter was then used to cannulate the patient's left ureteral orifice and a retrograde pyelogram was performed using 10 cc of Omnipaque contrast with the above findings.  A 0.38 sensor wire was then advanced through the catheter and up into the left renal pelvis under fluoroscopic guidance removing the catheter over the wire.  The cystoscope was then removed at the bladder was emptied.  A 12/14 French x25 cm ureteral access sheath was then advanced up through the patient's left ureteral orifice and into the proximal ureter under fluoroscopic guidance.  The inner portion of the access sheath was removed along with the wire.  I then advanced the ureteroscope up through the access sheath and into the renal pelvis.  The stone was quickly then encountered.  I placed the stone up into the left upper pole using a 200 m fiber with settings of 10 Hz and 1.0 J the stone was fragmented into small pieces.  The stone fragments were then removed with a engage nitinol basket.  Fluoroscopic guided pyeloscopy was then performed to ensure that all stone fragments had been completely removed.  I then reintroduced the sensor wire through the access sheath and remove the access sheath.  I then backloaded the wire through the cystoscope and advanced a 24 cm x 6 French double-J ureteral stent over the wire and into the upper pole of the left kidney.  Once the stent was noted to be well into the renal pelvis I pulled the scope back to the bladder neck and advanced the stent into the bladder before removing the wire entirely.  A nice curl was noted on the patient bladder side.  The stent string was then left on and secured to the ventral aspect of the patient's penis.  A B&O suppository was placed in the patient's rectum.  He was subsequently extubated return the PACU in stable condition.  Ardis Hughs, M.D.

## 2018-01-11 NOTE — Anesthesia Preprocedure Evaluation (Signed)
Anesthesia Evaluation  Patient identified by MRN, date of birth, ID band Patient awake    Reviewed: Allergy & Precautions, NPO status , Patient's Chart, lab work & pertinent test results  Airway Mallampati: II  TM Distance: >3 FB Neck ROM: Full    Dental no notable dental hx.    Pulmonary neg pulmonary ROS, former smoker,    Pulmonary exam normal breath sounds clear to auscultation       Cardiovascular hypertension, + CAD and + Peripheral Vascular Disease  Normal cardiovascular exam Rhythm:Regular Rate:Normal     Neuro/Psych negative neurological ROS  negative psych ROS   GI/Hepatic Neg liver ROS, GERD  ,  Endo/Other  negative endocrine ROS  Renal/GU negative Renal ROS  negative genitourinary   Musculoskeletal negative musculoskeletal ROS (+)   Abdominal   Peds negative pediatric ROS (+)  Hematology  (+) anemia ,   Anesthesia Other Findings   Reproductive/Obstetrics negative OB ROS                             Anesthesia Physical Anesthesia Plan  ASA: III  Anesthesia Plan: General   Post-op Pain Management:    Induction: Intravenous  PONV Risk Score and Plan: 2 and Ondansetron, Dexamethasone and Treatment may vary due to age or medical condition  Airway Management Planned: LMA  Additional Equipment:   Intra-op Plan:   Post-operative Plan:   Informed Consent: I have reviewed the patients History and Physical, chart, labs and discussed the procedure including the risks, benefits and alternatives for the proposed anesthesia with the patient or authorized representative who has indicated his/her understanding and acceptance.   Dental advisory given  Plan Discussed with: CRNA and Surgeon  Anesthesia Plan Comments:         Anesthesia Quick Evaluation

## 2018-01-11 NOTE — Transfer of Care (Signed)
Immediate Anesthesia Transfer of Care Note  Patient: Nathan Santiago [facility]  Procedure(s) Performed: LEFT URETEROSCOPY/HOLMIUM LASER/STENT PLACEMENT (Left Ureter)  Patient Location: PACU  Anesthesia Type:General  Level of Consciousness: awake, alert  and oriented  Airway & Oxygen Therapy: Patient Spontanous Breathing and Patient connected to face mask oxygen  Post-op Assessment: Report given to RN and Post -op Vital signs reviewed and stable  Post vital signs: Reviewed and stable  Last Vitals:  Vitals Value Taken Time  BP 191/74 01/11/2018  9:10 AM  Temp 36.5 C 01/11/2018  9:10 AM  Pulse 88 01/11/2018  9:11 AM  Resp 13 01/11/2018  9:11 AM  SpO2 99 % 01/11/2018  9:11 AM  Vitals shown include unvalidated device data.  Last Pain:  Vitals:   01/11/18 0615  TempSrc:   PainSc: 0-No pain      Patients Stated Pain Goal: 4 (44/03/47 [id number])  Complications: No apparent anesthesia complications

## 2018-01-11 NOTE — Progress Notes (Signed)
Dr. Kalman Shan notified that pt did not take his atenolol this morning, last dose was at 0730 am yesterday. No new orders, Dr. Kalman Shan communicated that he would be treated for hr/bp in OR and may possibly receive his beta blocker post-surgery. Peggy, CRNA notified of this information.

## 2018-01-11 NOTE — Discharge Instructions (Signed)
DISCHARGE INSTRUCTIONS FOR KIDNEY STONE/URETERAL STENT   MEDICATIONS:  1.  Resume all your other meds from home - except do not take any extra narcotic pain meds that you may have at home.  2. Pyridium is to help with the burning/stinging when you urinate. 3. Tramadol is for moderate/severe pain, otherwise taking upto 1000 mg every 6 hours of plainTylenol will help treat your pain.   4. Take Cipro one hour prior to removal of your stent.   ACTIVITY:  1. No strenuous activity x 1week  2. No driving while on narcotic pain medications  3. Drink plenty of water  4. Continue to walk at home - you can still get blood clots when you are at home, so keep active, but don't over do it.  5. May return to work/school tomorrow or when you feel ready   BATHING:  1. You can shower and we recommend daily showers  2. You have a string coming from your urethra: The stent string is attached to your ureteral stent. Do not pull on this.   SIGNS/SYMPTOMS TO CALL:  Please call us if you have a fever greater than 101.5, uncontrolled nausea/vomiting, uncontrolled pain, dizziness, unable to urinate, bloody urine, chest pain, shortness of breath, leg swelling, leg pain, redness around wound, drainage from wound, or any other concerns or questions.   You can reach [place] at [phone].   FOLLOW-UP:  1. You have an appointment in 6 weeks with a ultrasound of your kidneys prior.   2. You have a string attached to your stent, you may remove it on Tuesday, June 18th in the morning. To do this, pull the strings until the stents are completely removed. You may feel an odd sensation in your back.  DON'T FORGET TO TAKE THE CIPRO ANTIBIOTIC BEFORE REMOVING THE STENT.

## 2018-01-11 NOTE — Anesthesia Procedure Notes (Signed)
Procedure Name: LMA Insertion Date/Time: 01/11/2018 7:32 AM Performed by: Brady Schiller D, CRNA Pre-anesthesia Checklist: Patient identified, Emergency Drugs available, Suction available and Patient being monitored Patient Re-evaluated:Patient Re-evaluated prior to induction Oxygen Delivery Method: Circle system utilized Preoxygenation: Pre-oxygenation with 100% oxygen Induction Type: IV induction Ventilation: Mask ventilation without difficulty LMA: LMA inserted LMA Size: 4.0 Tube type: Oral Number of attempts: 1 Placement Confirmation: positive ETCO2 and breath sounds checked- equal and bilateral Tube secured with: Tape Dental Injury: Teeth and Oropharynx as per pre-operative assessment

## 2018-01-11 NOTE — Interval H&P Note (Signed)
History and Physical Interval Note: CT scan demonstrated a large left sided UPJ stone.  We discussed treatment options and settled on ureteroscopy.  01/11/2018 5:50 AM  Nathan Santiago  has presented today for surgery, with the diagnosis of LEFT URETERAL STONE  The various methods of treatment have been discussed with the patient and family. After consideration of risks, benefits and other options for treatment, the patient has consented to  Procedure(s): LEFT URETEROSCOPY/HOLMIUM LASER/STENT PLACEMENT (Left) as a surgical intervention .  The patient's history has been reviewed, patient examined, no change in status, stable for surgery.  I have reviewed the patient's chart and labs.  Questions were answered to the patient's satisfaction.     Louis Meckel W

## 2018-01-11 NOTE — Anesthesia Postprocedure Evaluation (Signed)
Anesthesia Post Note  Patient: Joenathan Sakuma [facility]  Procedure(s) Performed: LEFT URETEROSCOPY/HOLMIUM LASER/STENT PLACEMENT (Left Ureter)     Patient location during evaluation: PACU Anesthesia Type: General Level of consciousness: awake and alert Pain management: pain level controlled Vital Signs Assessment: post-procedure vital signs reviewed and stable Respiratory status: spontaneous breathing, nonlabored ventilation, respiratory function stable and patient connected to nasal cannula oxygen Cardiovascular status: blood pressure returned to baseline and stable Postop Assessment: no apparent nausea or vomiting Anesthetic complications: no    Last Vitals:  Vitals:   01/11/18 1000 01/11/18 1015  BP: (!) 179/89 (!) 175/73  Pulse: 88 78  Resp: 18 13  Temp:  (P) 36.4 C  SpO2: 94% 94%    Last Pain:  Vitals:   01/11/18 1015  TempSrc:   PainSc: (P) 3                  Cerra Eisenhower S

## 2018-01-12 ENCOUNTER — Encounter (HOSPITAL_COMMUNITY): Payer: Self-pay | Admitting: Urology

## 2018-01-15 DIAGNOSIS — N201 Calculus of ureter: Secondary | ICD-10-CM | POA: Diagnosis not present

## 2018-01-16 ENCOUNTER — Encounter: Payer: Medicare Other | Admitting: Physical Therapy

## 2018-01-16 DIAGNOSIS — M79604 Pain in right leg: Secondary | ICD-10-CM | POA: Diagnosis not present

## 2018-01-18 ENCOUNTER — Encounter: Payer: Medicare Other | Admitting: Physical Therapy

## 2018-01-22 DIAGNOSIS — M545 Low back pain: Secondary | ICD-10-CM | POA: Diagnosis not present

## 2018-01-22 DIAGNOSIS — M25561 Pain in right knee: Secondary | ICD-10-CM | POA: Diagnosis not present

## 2018-01-24 DIAGNOSIS — R42 Dizziness and giddiness: Secondary | ICD-10-CM | POA: Diagnosis not present

## 2018-01-24 DIAGNOSIS — I739 Peripheral vascular disease, unspecified: Secondary | ICD-10-CM | POA: Diagnosis not present

## 2018-01-24 DIAGNOSIS — I6529 Occlusion and stenosis of unspecified carotid artery: Secondary | ICD-10-CM | POA: Diagnosis not present

## 2018-01-24 DIAGNOSIS — I10 Essential (primary) hypertension: Secondary | ICD-10-CM | POA: Diagnosis not present

## 2018-01-24 DIAGNOSIS — R7309 Other abnormal glucose: Secondary | ICD-10-CM | POA: Diagnosis not present

## 2018-01-24 DIAGNOSIS — C61 Malignant neoplasm of prostate: Secondary | ICD-10-CM | POA: Diagnosis not present

## 2018-01-24 DIAGNOSIS — N200 Calculus of kidney: Secondary | ICD-10-CM | POA: Diagnosis not present

## 2018-01-24 DIAGNOSIS — R269 Unspecified abnormalities of gait and mobility: Secondary | ICD-10-CM | POA: Diagnosis not present

## 2018-01-24 DIAGNOSIS — Z6829 Body mass index (BMI) 29.0-29.9, adult: Secondary | ICD-10-CM | POA: Diagnosis not present

## 2018-01-24 DIAGNOSIS — E7849 Other hyperlipidemia: Secondary | ICD-10-CM | POA: Diagnosis not present

## 2018-01-25 DIAGNOSIS — R1084 Generalized abdominal pain: Secondary | ICD-10-CM | POA: Diagnosis not present

## 2018-01-25 DIAGNOSIS — N130 Hydronephrosis with ureteropelvic junction obstruction: Secondary | ICD-10-CM | POA: Diagnosis not present

## 2018-01-25 DIAGNOSIS — R310 Gross hematuria: Secondary | ICD-10-CM | POA: Diagnosis not present

## 2018-01-29 DIAGNOSIS — N201 Calculus of ureter: Secondary | ICD-10-CM | POA: Diagnosis not present

## 2018-02-15 DIAGNOSIS — N200 Calculus of kidney: Secondary | ICD-10-CM | POA: Diagnosis not present

## 2018-02-15 DIAGNOSIS — R310 Gross hematuria: Secondary | ICD-10-CM | POA: Diagnosis not present

## 2018-03-29 DIAGNOSIS — D485 Neoplasm of uncertain behavior of skin: Secondary | ICD-10-CM | POA: Diagnosis not present

## 2018-03-29 DIAGNOSIS — L820 Inflamed seborrheic keratosis: Secondary | ICD-10-CM | POA: Diagnosis not present

## 2018-03-29 DIAGNOSIS — L821 Other seborrheic keratosis: Secondary | ICD-10-CM | POA: Diagnosis not present

## 2018-04-08 DIAGNOSIS — H0014 Chalazion left upper eyelid: Secondary | ICD-10-CM | POA: Diagnosis not present

## 2018-04-08 DIAGNOSIS — H0015 Chalazion left lower eyelid: Secondary | ICD-10-CM | POA: Diagnosis not present

## 2018-04-15 ENCOUNTER — Encounter: Payer: Self-pay | Admitting: *Deleted

## 2018-04-15 ENCOUNTER — Ambulatory Visit (INDEPENDENT_AMBULATORY_CARE_PROVIDER_SITE_OTHER): Payer: Medicare Other | Admitting: Emergency Medicine

## 2018-04-15 ENCOUNTER — Ambulatory Visit (INDEPENDENT_AMBULATORY_CARE_PROVIDER_SITE_OTHER)
Admission: RE | Admit: 2018-04-15 | Discharge: 2018-04-15 | Disposition: A | Discharge status: Home / Self Care | Payer: Medicare Other | Source: Ambulatory Visit | Attending: Emergency Medicine | Admitting: Emergency Medicine

## 2018-04-15 VITALS — BP 130/70 | HR 71 | Ht 64.0 in | Wt 178.0 lb

## 2018-04-15 DIAGNOSIS — R053 Chronic cough: Secondary | ICD-10-CM | POA: Insufficient documentation

## 2018-04-15 DIAGNOSIS — I6523 Occlusion and stenosis of bilateral carotid arteries: Secondary | ICD-10-CM | POA: Diagnosis not present

## 2018-04-15 DIAGNOSIS — R05 Cough: Secondary | ICD-10-CM

## 2018-04-15 DIAGNOSIS — R059 Cough, unspecified: Secondary | ICD-10-CM

## 2018-04-15 MED ORDER — PANTOPRAZOLE SODIUM 40 MG PO TBEC: 40.0000 mg | DELAYED_RELEASE_TABLET | Freq: Two times a day (BID) | ORAL | 2 refills | Status: DC

## 2018-04-15 NOTE — Patient Instructions (Signed)
Chest x-ray today. Please temporarily stop your fish oil.  We will decide in the future whether to restart. We will temporarily increased pantoprazole to 40 mg twice a day.  Take this 1 hour before eating. Try your best to practice voice rest, avoid throat clearing.  It may be helpful to keep a sugar-free candy or cough drop in your mouth so that you can resist the urge to clear your throat. Depending on how your cough improves we may decide to perform pulmonary function testing or adjust your medications further.  We may even decide to perform an airway inspection if you are still having difficulty. Follow with Dr Lamonte Sakai in 1 month or next available to discuss your symptoms.

## 2018-04-15 NOTE — Progress Notes (Signed)
Subjective:    Patient ID: Nathan Santiago, male    DOB: 02/13/1931, 84 y.o.   MRN: [id number]  HPI 84 year old former smoker (40 pack years) with hypertension, coronary artery disease, peripheral vascular disease, GERD and PUD with LPR, aortic aneurysm.  Referred today for chronic cough.  He reports that he has had some cough for years. Over the last few months it has increased in frequency, longer spells. No clear inciting event, illness, URI.  Non-productive. Never gets waked from sleep. Worse with talking or singing. He has been using tessalon perles with good relief. Notices that it is worse after eating, can happen after laying down especially after eating. Has a lot of belching. No significant allergy symptoms. He denies any dyspnea, sputum.   He is on pantoprazole 40 mg daily.  Uses omega-3 fish oil.  He is been on lisinopril in the past but this was stopped due to cough.  Currently on losartan.    Review of Systems  Constitutional: Negative for fever and unexpected weight change.  HENT: Negative for congestion, dental problem, ear pain, nosebleeds, postnasal drip, rhinorrhea, sinus pressure, sneezing, sore throat and trouble swallowing.   Eyes: Negative for redness and itching.  Respiratory: Positive for cough. Negative for chest tightness, shortness of breath and wheezing.   Cardiovascular: Negative for palpitations and leg swelling.  Gastrointestinal: Negative for nausea and vomiting.  Genitourinary: Negative for dysuria.  Musculoskeletal: Negative for joint swelling.  Skin: Negative for rash.  Neurological: Negative for headaches.  Hematological: Does not bruise/bleed easily.  Psychiatric/Behavioral: Negative for dysphoric mood. The patient is not nervous/anxious.    Past Medical History:  Diagnosis Date  . AAA (abdominal aortic aneurysm) (HCC)    asymptomatic  . AAA (abdominal aortic aneurysm) ([facility]) 2010   peripheral  angiogram-- bilateral SFA DISEASE  and 60 to 70%  infrarenaal abd. aortic stenosis with 15 -mm gradient  . Adenomatous colon polyp 11/2003  . CAD (coronary artery disease)   . Gait abnormality 02/13/2017  . Gastroparesis    pt denies  . GERD (gastroesophageal reflux disease)    w/ LPR  . History of kidney stones    x2  . Hyperlipidemia   . Hypertension   . Peripheral arterial disease ([facility])    RCEA  by Dr Lemar Livings  . PUD (peptic ulcer disease)    pt unaware  . Vertigo      Family History  Problem Relation Age of Onset  . Ovarian cancer Sister   . Heart disease Father   . Brain cancer Brother        Tumor     Social History   Socioeconomic History  . Marital status: Married    Spouse name: [name]  . Number of children: Not on file  . Years of education: BA  . Highest education level: Not on file  Occupational History  . Occupation: Retired  Scientific laboratory technician  . Financial resource strain: Not on file  . Food insecurity:    Worry: Not on file    Inability: Not on file  . Transportation needs:    Medical: Not on file    Non-medical: Not on file  Tobacco Use  . Smoking status: Former Smoker    Packs/day: 2.00    Years: 20.00    Pack years: 40.00    Types: Cigarettes    Last attempt to quit: 07/31/1968    Years since quitting: 49.7  . Smokeless tobacco: Never Used  Substance and Sexual  Activity  . Alcohol use: Yes    Alcohol/week: 4.0 standard drinks    Types: 4 Glasses of wine per week  . Drug use: No  . Sexual activity: Not Currently  Lifestyle  . Physical activity:    Days per week: Not on file    Minutes per session: Not on file  . Stress: Not on file  Relationships  . Social connections:    Talks on phone: Not on file    Gets together: Not on file    Attends religious service: Not on file    Active member of club or organization: Not on file    Attends meetings of clubs or organizations: Not on file    Relationship status: Not on file  . Intimate partner violence:    Fear of current or ex partner:  Not on file    Emotionally abused: Not on file    Physically abused: Not on file    Forced sexual activity: Not on file  Other Topics Concern  . Not on file  Social History Narrative   Lives with wife   Caffeine use: Decaf coffee, tea   Left handed   He has worked as an Arboriculturist  Has lived in [place], [place] Was in [employer], in [place], was a Geneticist, molecular.   Allergies  Allergen Reactions  . Lisinopril Cough  . Niaspan [Niacin Er] Rash     Outpatient Medications Prior to Visit  Medication Sig Dispense Refill  . amLODipine (NORVASC) 5 MG tablet Take 5 mg by mouth daily.      [name] aspirin EC 81 MG tablet Take 81 mg by mouth daily.    [name] atenolol (TENORMIN) 50 MG tablet Take 50 mg by mouth daily.      [name] atorvastatin (LIPITOR) 40 MG tablet Take 40 mg by mouth daily at 6 PM.     . benzonatate (TESSALON) 100 MG capsule Take 100 mg by mouth 3 (three) times daily as needed for cough.   0  . Carboxymethylcellul-Glycerin (LUBRICATING EYE DROPS OP) Apply 1 drop to eye daily as needed (dry eyes).    . cholecalciferol (DELTA D3) 400 units TABS tablet Take 400 Units by mouth daily.    . cilostazol (PLETAL) 100 MG tablet Take 100 mg by mouth 2 (two) times daily.    . clopidogrel (PLAVIX) 75 MG tablet Take 75 mg by mouth daily.    . diazepam (VALIUM) 5 MG tablet Take 5 mg by mouth every 8 (eight) hours as needed for anxiety.    [name] EVENING PRIMROSE OIL PO Take 1 capsule by mouth daily.    . fish oil-omega-3 fatty acids 1000 MG capsule Take 1,000 mg by mouth daily.     . hydrochlorothiazide 25 MG tablet Take 25 mg by mouth daily.      [name] losartan (COZAAR) 50 MG tablet Take 50 mg by mouth every evening.     . magnesium oxide (MAG-OX) 400 MG tablet Take 400 mg by mouth at bedtime.     . Misc Natural Products (GLUCOSAMINE CHONDROITIN TRIPLE) TABS Take 2 tablets by mouth daily.    . Multiple Vitamin (MULTIVITAMIN WITH MINERALS) TABS tablet Take 1 tablet by mouth daily.    . pantoprazole (PROTONIX) 40 MG tablet Take 1  tablet (40 mg total) by mouth daily. 30 tablet 12  . phenazopyridine (PYRIDIUM) 200 MG tablet Take 1 tablet (200 mg total) by mouth 3 (three) times daily as needed for pain. 10 tablet 0  .  potassium chloride SA (K-DUR,KLOR-CON) 20 MEQ tablet Take 20 mEq by mouth every evening.     . tamsulosin (FLOMAX) 0.4 MG CAPS capsule Take 0.4 mg by mouth daily.    . traMADol (ULTRAM) 50 MG tablet Take 1 tablet (50 mg total) by mouth every 6 (six) hours as needed (for pain.). 10 tablet 0  . vitamin C (ASCORBIC ACID) 500 MG tablet Take 500 mg by mouth daily.    . finasteride (PROSCAR) 5 MG tablet Take 5 mg by mouth daily.     No facility-administered medications prior to visit.         Objective:   Physical Exam Vitals:   04/15/18 1022  BP: 130/70  Pulse: 71  SpO2: 93%  Weight: 178 lb (80.7 kg)  Height: 5\' 4"  (1.626 m)   Gen: Pleasant, well-nourished, in no distress,  normal affect  ENT: No lesions,  mouth clear,  oropharynx clear, no postnasal drip  Neck: No JVD, no stridor  Lungs: No use of accessory muscles, clear B, no wheeze or crackles.   Cardiovascular: RRR, heart sounds normal, no murmur or gallops, no peripheral edema  Musculoskeletal: No deformities, no cyanosis or clubbing  Neuro: alert, non focal  Skin: Warm, no lesions or rash     Assessment & Plan:  Chronic cough Chronic cough without a clear trigger.  Most notable sustaining factor appears to be GERD.  Only partially controlled at this time.  I think will be reasonable to empirically increase his pantoprazole, avoid fish oil at least until next visit, see if he benefits.  We talked about raising the head of his bed slightly and modifying his diet, the timing of his meals.  He carries a history of allergic rhinitis but denies any allergy symptoms today.  Not clear that there is any role to start empiric allergy therapy at this time.  Likewise he has a tobacco history but does not have any symptoms consistent with COPD.  I  will hold off on pulmonary function testing although if his cough persists then this will be indicated.  Chest x-ray today will be performed.  If no clear etiology or response to empiric therapy for common exacerbate her is then we will discuss bronchoscopy and airway inspection.  Baltazar Apo, MD, PhD 04/15/2018, 11:02 AM [facility] [phone] or if no answer [phone]

## 2018-04-15 NOTE — Assessment & Plan Note (Signed)
Chronic cough without a clear trigger.  Most notable sustaining factor appears to be GERD.  Only partially controlled at this time.  I think will be reasonable to empirically increase his pantoprazole, avoid fish oil at least until next visit, see if he benefits.  We talked about raising the head of his bed slightly and modifying his diet, the timing of his meals.  He carries a history of allergic rhinitis but denies any allergy symptoms today.  Not clear that there is any role to start empiric allergy therapy at this time.  Likewise he has a tobacco history but does not have any symptoms consistent with COPD.  I will hold off on pulmonary function testing although if his cough persists then this will be indicated.  Chest x-ray today will be performed.  If no clear etiology or response to empiric therapy for common exacerbate her is then we will discuss bronchoscopy and airway inspection.

## 2018-05-06 DIAGNOSIS — Z23 Encounter for immunization: Secondary | ICD-10-CM | POA: Diagnosis not present

## 2018-05-28 ENCOUNTER — Ambulatory Visit (INDEPENDENT_AMBULATORY_CARE_PROVIDER_SITE_OTHER): Payer: Medicare Other | Admitting: Emergency Medicine

## 2018-05-28 ENCOUNTER — Encounter: Payer: Self-pay | Admitting: Emergency Medicine

## 2018-05-28 VITALS — BP 130/68 | HR 92 | Ht 64.0 in | Wt 178.0 lb

## 2018-05-28 DIAGNOSIS — R05 Cough: Secondary | ICD-10-CM | POA: Diagnosis not present

## 2018-05-28 DIAGNOSIS — I6523 Occlusion and stenosis of bilateral carotid arteries: Secondary | ICD-10-CM

## 2018-05-28 DIAGNOSIS — K219 Gastro-esophageal reflux disease without esophagitis: Secondary | ICD-10-CM

## 2018-05-28 DIAGNOSIS — R053 Chronic cough: Secondary | ICD-10-CM

## 2018-05-28 DIAGNOSIS — J301 Allergic rhinitis due to pollen: Secondary | ICD-10-CM | POA: Diagnosis not present

## 2018-05-28 MED ORDER — ESOMEPRAZOLE MAGNESIUM 40 MG PO CPDR: 40.0000 mg | DELAYED_RELEASE_CAPSULE | Freq: Two times a day (BID) | ORAL | 1 refills | Status: DC

## 2018-05-28 NOTE — Assessment & Plan Note (Signed)
Start loratadine 

## 2018-05-28 NOTE — Progress Notes (Signed)
Subjective:    Patient ID: Nathan Santiago, male    DOB: Jul 10, 1931, 84 y.o.   MRN: [id number]  HPI 84 year old former smoker (40 pack years) with hypertension, coronary artery disease, peripheral vascular disease, GERD and PUD with LPR, aortic aneurysm.  Referred today for chronic cough.  He reports that he has had some cough for years. Over the last few months it has increased in frequency, longer spells. No clear inciting event, illness, URI.  Non-productive. Never gets waked from sleep. Worse with talking or singing. He has been using tessalon perles with good relief. Notices that it is worse after eating, can happen after laying down especially after eating. Has a lot of belching. No significant allergy symptoms. He denies any dyspnea, sputum.   He is on pantoprazole 40 mg daily.  Uses omega-3 fish oil.  He is been on lisinopril in the past but this was stopped due to cough.  Currently on losartan.    ROV 05/28/18 --this a follow-up visit for patient with history of tobacco use and chronic cough.  He has hypertension, CAD, PVD, GERD with LPR as outlined above.  At his initial visit we focused on the GERD and increased his pantoprazole empirically, stop fish oil temporarily. He notes that his cough is improved - less frequent, but still happens sometimes, often when laying down flat. No sputum, but he has noted some nasal congestion and gtt since last time.    Review of Systems  Constitutional: Negative for fever and unexpected weight change.  HENT: Negative for congestion, dental problem, ear pain, nosebleeds, postnasal drip, rhinorrhea, sinus pressure, sneezing, sore throat and trouble swallowing.   Eyes: Negative for redness and itching.  Respiratory: Positive for cough. Negative for chest tightness, shortness of breath and wheezing.   Cardiovascular: Negative for palpitations and leg swelling.  Gastrointestinal: Negative for nausea and vomiting.  Genitourinary: Negative for dysuria.    Musculoskeletal: Negative for joint swelling.  Skin: Negative for rash.  Neurological: Negative for headaches.  Hematological: Does not bruise/bleed easily.  Psychiatric/Behavioral: Negative for dysphoric mood. The patient is not nervous/anxious.    Past Medical History:  Diagnosis Date  . AAA (abdominal aortic aneurysm) (HCC)    asymptomatic  . AAA (abdominal aortic aneurysm) ([facility]) 2010   peripheral  angiogram-- bilateral SFA DISEASE  and 60 to 70% infrarenaal abd. aortic stenosis with 15 -mm gradient  . Adenomatous colon polyp 11/2003  . CAD (coronary artery disease)   . Gait abnormality 02/13/2017  . Gastroparesis    pt denies  . GERD (gastroesophageal reflux disease)    w/ LPR  . History of kidney stones    x2  . Hyperlipidemia   . Hypertension   . Peripheral arterial disease ([facility])    RCEA  by Dr Lemar Livings  . PUD (peptic ulcer disease)    pt unaware  . Vertigo      Family History  Problem Relation Age of Onset  . Ovarian cancer Sister   . Heart disease Father   . Brain cancer Brother        Tumor     Social History   Socioeconomic History  . Marital status: Married    Spouse name: [name]  . Number of children: Not on file  . Years of education: BA  . Highest education level: Not on file  Occupational History  . Occupation: Retired  Scientific laboratory technician  . Financial resource strain: Not on file  . Food insecurity:  Worry: Not on file    Inability: Not on file  . Transportation needs:    Medical: Not on file    Non-medical: Not on file  Tobacco Use  . Smoking status: Former Smoker    Packs/day: 2.00    Years: 20.00    Pack years: 40.00    Types: Cigarettes    Last attempt to quit: 07/31/1968    Years since quitting: 49.8  . Smokeless tobacco: Never Used  Substance and Sexual Activity  . Alcohol use: Yes    Alcohol/week: 4.0 standard drinks    Types: 4 Glasses of wine per week  . Drug use: No  . Sexual activity: Not Currently  Lifestyle  .  Physical activity:    Days per week: Not on file    Minutes per session: Not on file  . Stress: Not on file  Relationships  . Social connections:    Talks on phone: Not on file    Gets together: Not on file    Attends religious service: Not on file    Active member of club or organization: Not on file    Attends meetings of clubs or organizations: Not on file    Relationship status: Not on file  . Intimate partner violence:    Fear of current or ex partner: Not on file    Emotionally abused: Not on file    Physically abused: Not on file    Forced sexual activity: Not on file  Other Topics Concern  . Not on file  Social History Narrative   Lives with wife   Caffeine use: Decaf coffee, tea   Left handed   He has worked as an Arboriculturist  Has lived in [place], [place] Was in [employer], in [place], was a Geneticist, molecular.   Allergies  Allergen Reactions  . Lisinopril Cough  . Niaspan [Niacin Er] Rash     Outpatient Medications Prior to Visit  Medication Sig Dispense Refill  . amLODipine (NORVASC) 5 MG tablet Take 5 mg by mouth daily.      [name] aspirin EC 81 MG tablet Take 81 mg by mouth daily.    [name] atenolol (TENORMIN) 50 MG tablet Take 50 mg by mouth daily.      [name] atorvastatin (LIPITOR) 40 MG tablet Take 40 mg by mouth daily at 6 PM.     . benzonatate (TESSALON) 100 MG capsule Take 100 mg by mouth 3 (three) times daily as needed for cough.   0  . Carboxymethylcellul-Glycerin (LUBRICATING EYE DROPS OP) Apply 1 drop to eye daily as needed (dry eyes).    . cholecalciferol (DELTA D3) 400 units TABS tablet Take 400 Units by mouth daily.    . cilostazol (PLETAL) 100 MG tablet Take 100 mg by mouth 2 (two) times daily.    . clopidogrel (PLAVIX) 75 MG tablet Take 75 mg by mouth daily.    . diazepam (VALIUM) 5 MG tablet Take 5 mg by mouth every 8 (eight) hours as needed for anxiety.    [name] EVENING PRIMROSE OIL PO Take 1 capsule by mouth daily.    . fish oil-omega-3 fatty acids 1000 MG capsule Take 1,000 mg by  mouth daily.     . hydrochlorothiazide 25 MG tablet Take 25 mg by mouth daily.      [name] losartan (COZAAR) 50 MG tablet Take 50 mg by mouth every evening.     . magnesium oxide (MAG-OX) 400 MG tablet Take 400 mg by mouth at bedtime.     [name]  Misc Natural Products (GLUCOSAMINE CHONDROITIN TRIPLE) TABS Take 2 tablets by mouth daily.    . Multiple Vitamin (MULTIVITAMIN WITH MINERALS) TABS tablet Take 1 tablet by mouth daily.    . phenazopyridine (PYRIDIUM) 200 MG tablet Take 1 tablet (200 mg total) by mouth 3 (three) times daily as needed for pain. 10 tablet 0  . potassium chloride SA (K-DUR,KLOR-CON) 20 MEQ tablet Take 20 mEq by mouth every evening.     . tamsulosin (FLOMAX) 0.4 MG CAPS capsule Take 0.4 mg by mouth daily.    . traMADol (ULTRAM) 50 MG tablet Take 1 tablet (50 mg total) by mouth every 6 (six) hours as needed (for pain.). 10 tablet 0  . vitamin C (ASCORBIC ACID) 500 MG tablet Take 500 mg by mouth daily.    . pantoprazole (PROTONIX) 40 MG tablet Take 1 tablet (40 mg total) by mouth daily. 30 tablet 12  . pantoprazole (PROTONIX) 40 MG tablet Take 1 tablet (40 mg total) by mouth 2 (two) times daily. 60 tablet 2   No facility-administered medications prior to visit.         Objective:   Physical Exam Vitals:   05/28/18 0910  BP: 130/68  Pulse: 92  SpO2: 96%  Weight: 178 lb (80.7 kg)  Height: 5\' 4"  (1.626 m)   Gen: Pleasant, well-nourished, in no distress,  normal affect  ENT: No lesions,  mouth clear,  oropharynx clear, no postnasal drip  Neck: No JVD, no stridor  Lungs: No use of accessory muscles, clear B, no wheeze or crackles.   Cardiovascular: RRR, heart sounds normal, no murmur or gallops, no peripheral edema  Musculoskeletal: No deformities, no cyanosis or clubbing  Neuro: alert, non focal  Skin: Warm, no lesions or rash     Assessment & Plan:  Allergic rhinitis Start loratadine  GERD Somewhat improved on the pantoprazole twice a day.  He has remembered  that he feels that his GERD was better controlled when he was on Nexium and would like to make a switch back.  We will do this twice a day initially but may be able to convert to daily once we get his cough under good control.  Chronic cough This is improved and again any residual cough does appear to be influenced by breakthrough GERD.  He recalls that he got a better response to Nexium then the pantoprazole and we can make a switch to this.  He also is having some new rhinitis symptoms and would like to treat allergies empirically.  Given his smoking history we will check PFT at his next visit.  We will convert your pantoprazole to Nexium 40mg  twice a day. Depending on how your cough does we may be able to decrease to once daily in the future.  Please start loratadine 10 mg (generic Claritin) once daily until our next visit.  You can get this across [facility]. at the [place] Stay off your fish oil We will perform pulmonary function testing at your next office visit. Follow with Dr Lamonte Sakai in 1 month or next available with full PFT on the same day.  Baltazar Apo, MD, PhD 05/28/2018, 9:54 AM [facility] [phone] or if no answer [phone]

## 2018-05-28 NOTE — Assessment & Plan Note (Signed)
Somewhat improved on the pantoprazole twice a day.  He has remembered that he feels that his GERD was better controlled when he was on Nexium and would like to make a switch back.  We will do this twice a day initially but may be able to convert to daily once we get his cough under good control.

## 2018-05-28 NOTE — Patient Instructions (Signed)
We will convert your pantoprazole to Nexium 40mg  twice a day. Depending on how your cough does we may be able to decrease to once daily in the future.  Please start loratadine 10 mg (generic Claritin) once daily until our next visit.  You can get this across [facility]. at the [place] Stay off your fish oil We will perform pulmonary function testing at your next office visit. Follow with Dr Lamonte Sakai in 1 month or next available with full PFT on the same day.

## 2018-05-28 NOTE — Assessment & Plan Note (Signed)
This is improved and again any residual cough does appear to be influenced by breakthrough GERD.  He recalls that he got a better response to Nexium then the pantoprazole and we can make a switch to this.  He also is having some new rhinitis symptoms and would like to treat allergies empirically.  Given his smoking history we will check PFT at his next visit.  We will convert your pantoprazole to Nexium 40mg  twice a day. Depending on how your cough does we may be able to decrease to once daily in the future.  Please start loratadine 10 mg (generic Claritin) once daily until our next visit.  You can get this across [facility]. at the [place] Stay off your fish oil We will perform pulmonary function testing at your next office visit. Follow with Dr Lamonte Sakai in 1 month or next available with full PFT on the same day.

## 2018-06-03 ENCOUNTER — Telehealth: Payer: Self-pay | Admitting: Emergency Medicine

## 2018-06-03 NOTE — Telephone Encounter (Signed)
They are correct that the PPI's lower the activity of plavix slightly. That being said he has tolerated pantoprozole, has been on nexium before in the past. If he is concerned about the effects of this class of meds on his plavix then we could consider changing to pepcid 20mg  twice a day.

## 2018-06-03 NOTE — Telephone Encounter (Signed)
Called and spoke with pharmacist, she stated that the patient is on nexium and he was prescribed plavix. These two medications have an interaction.   RB is it ok to prescribe please advise, thank you.

## 2018-06-03 NOTE — Telephone Encounter (Signed)
Called and spoke with patient he is going to do OTC pepcid. Called and spoke with pharmacist. We have cancelled the nexium. Nothing further needed.

## 2018-07-16 ENCOUNTER — Ambulatory Visit (INDEPENDENT_AMBULATORY_CARE_PROVIDER_SITE_OTHER): Payer: Medicare Other | Admitting: Emergency Medicine

## 2018-07-16 ENCOUNTER — Telehealth: Payer: Self-pay

## 2018-07-16 ENCOUNTER — Encounter: Payer: Self-pay | Admitting: Emergency Medicine

## 2018-07-16 ENCOUNTER — Other Ambulatory Visit: Payer: Self-pay

## 2018-07-16 ENCOUNTER — Encounter: Payer: Self-pay | Admitting: Cardiovascular Disease

## 2018-07-16 ENCOUNTER — Ambulatory Visit (INDEPENDENT_AMBULATORY_CARE_PROVIDER_SITE_OTHER): Payer: Medicare Other | Admitting: Cardiovascular Disease

## 2018-07-16 DIAGNOSIS — R05 Cough: Secondary | ICD-10-CM

## 2018-07-16 DIAGNOSIS — I2510 Atherosclerotic heart disease of native coronary artery without angina pectoris: Secondary | ICD-10-CM | POA: Diagnosis not present

## 2018-07-16 DIAGNOSIS — I6523 Occlusion and stenosis of bilateral carotid arteries: Secondary | ICD-10-CM | POA: Diagnosis not present

## 2018-07-16 DIAGNOSIS — I739 Peripheral vascular disease, unspecified: Secondary | ICD-10-CM | POA: Diagnosis not present

## 2018-07-16 DIAGNOSIS — I10 Essential (primary) hypertension: Secondary | ICD-10-CM | POA: Diagnosis not present

## 2018-07-16 DIAGNOSIS — E7800 Pure hypercholesterolemia, unspecified: Secondary | ICD-10-CM

## 2018-07-16 DIAGNOSIS — R053 Chronic cough: Secondary | ICD-10-CM

## 2018-07-16 LAB — PULMONARY FUNCTION TEST
DL/VA % PRED: 81 %
DL/VA: 3.38 ml/min/mmHg/L
DLCO unc % pred: 66 %
DLCO unc: 16.1 ml/min/mmHg
FEF 25-75 POST: 1.61 L/s
FEF 25-75 Pre: 1.38 L/sec
FEF2575-%Change-Post: 17 %
FEF2575-%PRED-POST: 135 %
FEF2575-%PRED-PRE: 115 %
FEV1-%Change-Post: 4 %
FEV1-%PRED-PRE: 90 %
FEV1-%Pred-Post: 94 %
FEV1-PRE: 1.75 L
FEV1-Post: 1.83 L
FEV1FVC-%CHANGE-POST: 1 %
FEV1FVC-%PRED-PRE: 106 %
FEV6-%Change-Post: 4 %
FEV6-%Pred-Post: 92 %
FEV6-%Pred-Pre: 88 %
FEV6-POST: 2.4 L
FEV6-Pre: 2.29 L
FEV6FVC-%Change-Post: 1 %
FEV6FVC-%PRED-POST: 109 %
FEV6FVC-%Pred-Pre: 107 %
FVC-%Change-Post: 3 %
FVC-%PRED-POST: 84 %
FVC-%PRED-PRE: 81 %
FVC-POST: 2.41 L
FVC-Pre: 2.33 L
PRE FEV6/FVC RATIO: 98 %
Post FEV1/FVC ratio: 76 %
Post FEV6/FVC ratio: 100 %
Pre FEV1/FVC ratio: 75 %
RV % pred: 96 %
RV: 2.35 L
TLC % pred: 81 %
TLC: 4.79 L

## 2018-07-16 NOTE — Assessment & Plan Note (Signed)
Significantly better although not completely gone.  The most significant factor appear to be GERD.  He had originally considered changing his PPI to Pepcid due to an interaction with his Plavix.  In the end this was never done and he remains on pantoprazole.  He is tolerating swallow leave him on this.  His pulmonary function testing did not show any evidence for clinically significant obstructive lung disease.  Please continue to take your pantoprazole 40 mg twice a day. Try to avoid late meals or laying down right after a meal. Your pulmonary function testing today did not show any evidence for significant asthma or COPD.  This is good news. Follow with Dr Lamonte Sakai if needed for any change in your cough or shortness of breath.

## 2018-07-16 NOTE — Telephone Encounter (Signed)
Error

## 2018-07-16 NOTE — Progress Notes (Signed)
Subjective:    Patient ID: Nathan Santiago, male    DOB: [birth year], 84 y.o.   MRN: [id number]  HPI 84 year old former smoker (40 pack years) with hypertension, coronary artery disease, peripheral vascular disease, GERD and PUD with LPR, aortic aneurysm.  Referred today for chronic cough.  He reports that he has had some cough for years. Over the last few months it has increased in frequency, longer spells. No clear inciting event, illness, URI.  Non-productive. Never gets waked from sleep. Worse with talking or singing. He has been using tessalon perles with good relief. Notices that it is worse after eating, can happen after laying down especially after eating. Has a lot of belching. No significant allergy symptoms. He denies any dyspnea, sputum.   He is on pantoprazole 40 mg daily.  Uses omega-3 fish oil.  He is been on lisinopril in the past but this was stopped due to cough.  Currently on losartan.    ROV 05/28/18 --this a follow-up visit for patient with history of tobacco use and chronic cough.  He has hypertension, CAD, PVD, GERD with LPR as outlined above.  At his initial visit we focused on the GERD and increased his pantoprazole empirically, stop fish oil temporarily. He notes that his cough is improved - less frequent, but still happens sometimes, often when laying down flat. No sputum, but he has noted some nasal congestion and gtt since last time.   ROV 07/16/18 --84 year old man with a history of significant tobacco use, coronary disease, PVD, GERD with LPR, chronic cough.  As above we have concentrated on try to treat his GERD more effectively. Her is on pantoprazole.  He has continued to have some breakthrough cough but it has been better.  Last time we added loratadine.  The head of his bed is not elevated.   Pulmonary function testing was done today and I have reviewed.  This shows grossly normal airflows with some suggestion on his flow volume loop of possible combined  obstruction and restriction.  His total lung capacity is normal.  He has a slightly decreased diffusion capacity that corrects to the normal range when adjusted for alveolar volume. No real evidence for COPD.    Review of Systems  Constitutional: Negative for fever and unexpected weight change.  HENT: Negative for congestion, dental problem, ear pain, nosebleeds, postnasal drip, rhinorrhea, sinus pressure, sneezing, sore throat and trouble swallowing.   Eyes: Negative for redness and itching.  Respiratory: Positive for cough. Negative for chest tightness, shortness of breath and wheezing.   Cardiovascular: Negative for palpitations and leg swelling.  Gastrointestinal: Negative for nausea and vomiting.  Genitourinary: Negative for dysuria.  Musculoskeletal: Negative for joint swelling.  Skin: Negative for rash.  Neurological: Negative for headaches.  Hematological: Does not bruise/bleed easily.  Psychiatric/Behavioral: Negative for dysphoric mood. The patient is not nervous/anxious.    Past Medical History:  Diagnosis Date  . AAA (abdominal aortic aneurysm) (HCC)    asymptomatic  . AAA (abdominal aortic aneurysm) ([place]) 2010   peripheral  angiogram-- bilateral SFA DISEASE  and 60 to 70% infrarenaal abd. aortic stenosis with 15 -mm gradient  . Adenomatous colon polyp 11/2003  . CAD (coronary artery disease)   . Gait abnormality 02/13/2017  . Gastroparesis    pt denies  . GERD (gastroesophageal reflux disease)    w/ LPR  . History of kidney stones    x2  . Hyperlipidemia   . Hypertension   . Peripheral arterial disease (  [place])    RCEA  by Dr Lemar Livings  . PUD (peptic ulcer disease)    pt unaware  . Vertigo      Family History  Problem Relation Age of Onset  . Ovarian cancer Sister   . Heart disease Father   . Brain cancer Brother        Tumor     Social History   Socioeconomic History  . Marital status: Married    Spouse name: [name]  . Number of children: Not on  file  . Years of education: BA  . Highest education level: Not on file  Occupational History  . Occupation: Retired  Scientific laboratory technician  . Financial resource strain: Not on file  . Food insecurity:    Worry: Not on file    Inability: Not on file  . Transportation needs:    Medical: Not on file    Non-medical: Not on file  Tobacco Use  . Smoking status: Former Smoker    Packs/day: 2.00    Years: 20.00    Pack years: 40.00    Types: Cigarettes    Last attempt to quit: 07/31/1968    Years since quitting: 49.9  . Smokeless tobacco: Never Used  Substance and Sexual Activity  . Alcohol use: Yes    Alcohol/week: 4.0 standard drinks    Types: 4 Glasses of wine per week  . Drug use: No  . Sexual activity: Not Currently  Lifestyle  . Physical activity:    Days per week: Not on file    Minutes per session: Not on file  . Stress: Not on file  Relationships  . Social connections:    Talks on phone: Not on file    Gets together: Not on file    Attends religious service: Not on file    Active member of club or organization: Not on file    Attends meetings of clubs or organizations: Not on file    Relationship status: Not on file  . Intimate partner violence:    Fear of current or ex partner: Not on file    Emotionally abused: Not on file    Physically abused: Not on file    Forced sexual activity: Not on file  Other Topics Concern  . Not on file  Social History Narrative   Lives with wife   Caffeine use: Decaf coffee, tea   Left handed   He has worked as an Arboriculturist  Has lived in [place], [place] Was in [employer], in [place], was a Geneticist, molecular.   Allergies  Allergen Reactions  . Lisinopril Cough  . Niaspan [Niacin Er] Rash     Outpatient Medications Prior to Visit  Medication Sig Dispense Refill  . amLODipine (NORVASC) 5 MG tablet Take 5 mg by mouth daily.      [name] aspirin EC 81 MG tablet Take 81 mg by mouth daily.    [name] atenolol (TENORMIN) 50 MG tablet Take 50 mg by mouth daily.      [name]  atorvastatin (LIPITOR) 40 MG tablet Take 40 mg by mouth daily at 6 PM.     . benzonatate (TESSALON) 100 MG capsule Take 100 mg by mouth 3 (three) times daily as needed for cough.   0  . Carboxymethylcellul-Glycerin (LUBRICATING EYE DROPS OP) Apply 1 drop to eye daily as needed (dry eyes).    . cholecalciferol (DELTA D3) 400 units TABS tablet Take 400 Units by mouth daily.    . clopidogrel (PLAVIX) 75  MG tablet Take 75 mg by mouth daily.    . diazepam (VALIUM) 5 MG tablet Take 5 mg by mouth every 8 (eight) hours as needed for anxiety.    [name] esomeprazole (NEXIUM) 40 MG capsule Take 1 capsule (40 mg total) by mouth 2 (two) times daily before a meal. 180 capsule 1  . EVENING PRIMROSE OIL PO Take 1 capsule by mouth daily.    . fish oil-omega-3 fatty acids 1000 MG capsule Take 1,000 mg by mouth daily.     . hydrochlorothiazide 25 MG tablet Take 25 mg by mouth daily.      [name] losartan (COZAAR) 50 MG tablet Take 50 mg by mouth every evening.     . magnesium oxide (MAG-OX) 400 MG tablet Take 400 mg by mouth at bedtime.     . Misc Natural Products (GLUCOSAMINE CHONDROITIN TRIPLE) TABS Take 2 tablets by mouth daily.    . Multiple Vitamin (MULTIVITAMIN WITH MINERALS) TABS tablet Take 1 tablet by mouth daily.    . phenazopyridine (PYRIDIUM) 200 MG tablet Take 1 tablet (200 mg total) by mouth 3 (three) times daily as needed for pain. 10 tablet 0  . potassium chloride SA (K-DUR,KLOR-CON) 20 MEQ tablet Take 20 mEq by mouth every evening.     . traMADol (ULTRAM) 50 MG tablet Take 1 tablet (50 mg total) by mouth every 6 (six) hours as needed (for pain.). 10 tablet 0  . vitamin C (ASCORBIC ACID) 500 MG tablet Take 500 mg by mouth daily.     No facility-administered medications prior to visit.         Objective:   Physical Exam Vitals:   07/16/18 1604  BP: (!) 154/68  Pulse: 96  SpO2: 95%  Weight: 180 lb (81.6 kg)  Height: 5\' 4"  (1.626 m)   Gen: Pleasant, well-nourished, in no distress,  normal  affect  ENT: No lesions,  mouth clear,  oropharynx clear, no postnasal drip  Neck: No JVD, no stridor  Lungs: No use of accessory muscles, clear B, no wheeze or crackles.   Cardiovascular: RRR, heart sounds normal, no murmur or gallops, no peripheral edema  Musculoskeletal: No deformities, no cyanosis or clubbing  Neuro: alert, non focal  Skin: Warm, no lesions or rash     Assessment & Plan:  Chronic cough Significantly better although not completely gone.  The most significant factor appear to be GERD.  He had originally considered changing his PPI to Pepcid due to an interaction with his Plavix.  In the end this was never done and he remains on pantoprazole.  He is tolerating swallow leave him on this.  His pulmonary function testing did not show any evidence for clinically significant obstructive lung disease.  Please continue to take your pantoprazole 40 mg twice a day. Try to avoid late meals or laying down right after a meal. Your pulmonary function testing today did not show any evidence for significant asthma or COPD.  This is good news. Follow with Dr Lamonte Sakai if needed for any change in your cough or shortness of breath.  Baltazar Apo, MD, PhD 07/16/2018, 4:27 PM [facility] [phone] or if no answer [phone]

## 2018-07-16 NOTE — H&P (View-Only) (Signed)
07/16/2018 [name]   February 02, 1931  [id number]  Primary Physician [name], MD Primary Cardiologist: [name] [name], [place], [place]  HPI:  Nathan Santiago is a 84 y.o.  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 He was formally a patient of [name]. I last saw him in the office 05/23/2017.[name] His cardiovascular status profile is remarkable for remote tobacco abuse, 2 hypertension and hyperlipidemia. He is not diabetic. He does exercise [place] a [place] he walks 5 days a week 25 minutes a day. He hasn't known cardiovascular disease status post stenting of his proximal mid and distal RCA using overlapping stents as well as the proximal AV groove circumflex with a known occluded OM branch which is old. He has normal LV function by 2-D echo performed 02/07/12 a nonischemic Myoview 2010. He denies chest pain, shortness of breath or claudication. He had carotid Dopplers performed 01/09/14 that showed a widely patent right carotid endarterectomy site with moderate left ICA stenosis remained stable. Since I saw him in January of this year he's noticed increasing bilateral lower extremity claudication which is lifestyle limiting. Dopplers have shown a decline in his ABIs bilaterally. He does have high frequency signal in his right greater than left iliac systems. I performed peripheral angiography on him 04/24/16 revealing a 75% distal abdominal aortic stenosis with a 60 mm pullback gradient. He also had a 95% calcified distal left common femoral artery stenosis and an 80% segmental mid right SFA stenosis with 2 vessel runoff bilaterally. I stented his distal abdominal aorta with a 10 x 29 mm long balloon expandable stent. His ABIs and increased from 0.7 bilaterally up to 1 on the right and 0.8 on the left. His claudication completely resolved. He was able to go on a trip to the [facility] and ambulated without  limitation. Since I saw him a year ago he is complained of bilateral thigh claudication which is lifestyle limiting.  His Dopplers do appear to show moderate iliac disease.  He denies chest pain or shortness of breath.   Current Meds  Medication Sig  . amLODipine (NORVASC) 5 MG tablet Take 5 mg by mouth daily.    [name] aspirin EC 81 MG tablet Take 81 mg by mouth daily.  [name] atenolol (TENORMIN) 50 MG tablet Take 50 mg by mouth daily.    [name] atorvastatin (LIPITOR) 40 MG tablet Take 40 mg by mouth daily at 6 PM.   . benzonatate (TESSALON) 100 MG capsule Take 100 mg by mouth 3 (three) times daily as needed for cough.   . Carboxymethylcellul-Glycerin (LUBRICATING EYE DROPS OP) Apply 1 drop to eye daily as needed (dry eyes).  . cholecalciferol (DELTA D3) 400 units TABS tablet Take 400 Units by mouth daily.  . clopidogrel (PLAVIX) 75 MG tablet Take 75 mg by mouth daily.  . diazepam (VALIUM) 5 MG tablet Take 5 mg by mouth every 8 (eight) hours as needed for anxiety.  [name] esomeprazole (NEXIUM) 40 MG capsule Take 1 capsule (40 mg total) by mouth 2 (two) times daily before a meal.  . EVENING PRIMROSE OIL PO Take 1 capsule by mouth daily.  . fish oil-omega-3 fatty acids 1000 MG capsule Take 1,000 mg by mouth daily.   . hydrochlorothiazide 25 MG tablet Take 25 mg by mouth daily.    [name] losartan (COZAAR) 50 MG tablet Take 50 mg by mouth every evening.   [name]  magnesium oxide (MAG-OX) 400 MG tablet Take 400 mg by mouth at bedtime.   . Misc Natural Products (GLUCOSAMINE CHONDROITIN TRIPLE) TABS Take 2 tablets by mouth daily.  . Multiple Vitamin (MULTIVITAMIN WITH MINERALS) TABS tablet Take 1 tablet by mouth daily.  . phenazopyridine (PYRIDIUM) 200 MG tablet Take 1 tablet (200 mg total) by mouth 3 (three) times daily as needed for pain.  . potassium chloride SA (K-DUR,KLOR-CON) 20 MEQ tablet Take 20 mEq by mouth every evening.   . traMADol (ULTRAM) 50 MG tablet Take 1 tablet (50 mg total) by mouth every 6 (six) hours as  needed (for pain.).  [name] vitamin C (ASCORBIC ACID) 500 MG tablet Take 500 mg by mouth daily.     Allergies  Allergen Reactions  . Lisinopril Cough  . Niaspan [Niacin Er] Rash    Social History   Socioeconomic History  . Marital status: Married    Spouse name: Nathan Santiago  . Number of children: Not on file  . Years of education: BA  . Highest education level: Not on file  Occupational History  . Occupation: Retired  Scientific laboratory technician  . Financial resource strain: Not on file  . Food insecurity:    Worry: Not on file    Inability: Not on file  . Transportation needs:    Medical: Not on file    Non-medical: Not on file  Tobacco Use  . Smoking status: Former Smoker    Packs/day: 2.00    Years: 20.00    Pack years: 40.00    Types: Cigarettes    Last attempt to quit: 07/31/1968    Years since quitting: 49.9  . Smokeless tobacco: Never Used  Substance and Sexual Activity  . Alcohol use: Yes    Alcohol/week: 4.0 standard drinks    Types: 4 Glasses of wine per week  . Drug use: No  . Sexual activity: Not Currently  Lifestyle  . Physical activity:    Days per week: Not on file    Minutes per session: Not on file  . Stress: Not on file  Relationships  . Social connections:    Talks on phone: Not on file    Gets together: Not on file    Attends religious service: Not on file    Active member of club or organization: Not on file    Attends meetings of clubs or organizations: Not on file    Relationship status: Not on file  . Intimate partner violence:    Fear of current or ex partner: Not on file    Emotionally abused: Not on file    Physically abused: Not on file    Forced sexual activity: Not on file  Other Topics Concern  . Not on file  Social History Narrative   Lives with wife   Caffeine use: Decaf coffee, tea   Left handed     Review of Systems: General: negative for chills, fever, night sweats or weight changes.  Cardiovascular: negative for chest pain, dyspnea on  exertion, edema, orthopnea, palpitations, paroxysmal nocturnal dyspnea or shortness of breath Dermatological: negative for rash Respiratory: negative for cough or wheezing Urologic: negative for hematuria Abdominal: negative for nausea, vomiting, diarrhea, bright red blood per rectum, melena, or hematemesis Neurologic: negative for visual changes, syncope, or dizziness All other systems reviewed and are otherwise negative except as noted above.    Blood pressure 132/64, pulse 72, height 5\' 4"  (1.626 m), weight 181 lb 3.2 oz (82.2 kg).  General appearance: alert  and no distress Neck: no adenopathy, no JVD, supple, symmetrical, trachea midline, thyroid not enlarged, symmetric, no tenderness/mass/nodules and Soft left carotid bruit Lungs: clear to auscultation bilaterally Heart: regular rate and rhythm, S1, S2 normal, no murmur, click, rub or gallop Extremities: extremities normal, atraumatic, no cyanosis or edema Pulses: 2+ and symmetric Skin: Skin color, texture, turgor normal. No rashes or lesions Neurologic: Alert and oriented X 3, normal strength and tone. Normal symmetric reflexes. Normal coordination and gait  EKG sinus rhythm 72 with nonspecific ST and T wave changes.  I personally reviewed this EKG.  ASSESSMENT AND PLAN:   HYPERCHOLESTEROLEMIA History of hyperlipidemia on statin therapy with lipid profile performed 09/11/2017 revealing total cholesterol of 47, LDL of 80 and HDL 48.  Essential hypertension History of essential hypertension her blood pressure measured today 132/64.  He is on atenolol, amlodipine and losartan.  Coronary atherosclerosis History of CAD status post proximal mid and distal RCA stenting, proximal AV groove circumflex stenting with a known occluded obtuse marginal branch.  His last Myoview performed in 2010 was nonischemic.  He denies chest pain or shortness of breath.  Bilateral carotid artery disease ([place]) History of right carotid endarterectomy with  recent Dopplers performed 12/19/2017 revealing a widely patent right endarterectomy site with moderate left carotid stenosis is will be followed up on annual basis.  Peripheral arterial disease ([facility]) History of PAD status post Dalm aortic stenting by myself 04/24/2016 with 10 mm x 29 mm long blue expandable stent.  He did have a 95% calcified distal left common femoral artery stenosis and an 80% mid right SFA stenosis with two-vessel runoff bilaterally.  Lower extremity arterial Doppler studies performed 12/19/2017 revealed a right ABI 0.88 and a left that was noncompressible.  He did have a high-frequency signal in his right common iliac artery and a moderate [place] because he signal in his left.  He complains of thigh claudication which is lifestyle limiting.  I am going to repeat his Doppler studies and arrange for him to undergo angiography and potential intervention.      [name] FACP,FACC,FAHA, [facility] 07/16/2018 11:37 AM

## 2018-07-16 NOTE — Assessment & Plan Note (Signed)
History of PAD status post Dalm aortic stenting by myself 04/24/2016 with 10 mm x 29 mm long blue expandable stent.  He did have a 95% calcified distal left common femoral artery stenosis and an 80% mid right SFA stenosis with two-vessel runoff bilaterally.  Lower extremity arterial Doppler studies performed 12/19/2017 revealed a right ABI 0.88 and a left that was noncompressible.  He did have a high-frequency signal in his right common iliac artery and a moderate [place] because he signal in his left.  He complains of thigh claudication which is lifestyle limiting.  I am going to repeat his Doppler studies and arrange for him to undergo angiography and potential intervention.

## 2018-07-16 NOTE — Telephone Encounter (Signed)
I contacted the patient about the request for a sooner appointment via the wait list. Patient was advised Dr. Jannifer Franklin has opening today (07/16/2018) at 12 pm. Patient stated he would not be able to make this appointment due to another doctors appt at 11:30 today. Patient was advise we would keep his appointment as scheduled for now (08/01/2017) MB RN.

## 2018-07-16 NOTE — Assessment & Plan Note (Signed)
History of hyperlipidemia on statin therapy with lipid profile performed 09/11/2017 revealing total cholesterol of 47, LDL of 80 and HDL 48.

## 2018-07-16 NOTE — Assessment & Plan Note (Signed)
History of CAD status post proximal mid and distal RCA stenting, proximal AV groove circumflex stenting with a known occluded obtuse marginal branch.  His last Myoview performed in 2010 was nonischemic.  He denies chest pain or shortness of breath.

## 2018-07-16 NOTE — Progress Notes (Signed)
07/16/2018 [name]   [birth year]  [id number]  Primary Physician [name], MD Primary Cardiologist: [name] [name], [place], [place]  HPI:  Nathan Santiago is a 84 y.o.  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 He was formally a patient of [name]. I last saw him in the office 05/23/2017.[name] His cardiovascular status profile is remarkable for remote tobacco abuse, 2 hypertension and hyperlipidemia. He is not diabetic. He does [place] 5 days a week 25 minutes a day. He hasn't known cardiovascular disease status post stenting of his proximal mid and distal RCA using overlapping stents as well as the proximal AV groove circumflex with a known occluded OM branch which is old. He has normal LV function by 2-D echo performed 02/07/12 a nonischemic Myoview 2010. He denies chest pain, shortness of breath or claudication. He had carotid Dopplers performed 01/09/14 that showed a widely patent right carotid endarterectomy site with moderate left ICA stenosis remained stable. Since I saw him in January of this year he's noticed increasing bilateral lower extremity claudication which is lifestyle limiting. Dopplers have shown a decline in his ABIs bilaterally. He does have high frequency signal in his right greater than left iliac systems. I performed peripheral angiography on him 04/24/16 revealing a 75% distal abdominal aortic stenosis with a 60 mm pullback gradient. He also had a 95% calcified distal left common femoral artery stenosis and an 80% segmental mid right SFA stenosis with 2 vessel runoff bilaterally. I stented his distal abdominal aorta with a 10 x 29 mm long balloon expandable stent. His ABIs and increased from 0.7 bilaterally up to 1 on the right and 0.8 on the left. His claudication completely resolved. He was able to go on a trip to the [facility] and ambulated without  limitation. Since I saw him a year ago he is complained of bilateral thigh claudication which is lifestyle limiting.  His Dopplers do appear to show moderate iliac disease.  He denies chest pain or shortness of breath.   Current Meds  Medication Sig  . amLODipine (NORVASC) 5 MG tablet Take 5 mg by mouth daily.    [name] aspirin EC 81 MG tablet Take 81 mg by mouth daily.  [name] atenolol (TENORMIN) 50 MG tablet Take 50 mg by mouth daily.    [name] atorvastatin (LIPITOR) 40 MG tablet Take 40 mg by mouth daily at 6 PM.   . benzonatate (TESSALON) 100 MG capsule Take 100 mg by mouth 3 (three) times daily as needed for cough.   . Carboxymethylcellul-Glycerin (LUBRICATING EYE DROPS OP) Apply 1 drop to eye daily as needed (dry eyes).  . cholecalciferol (DELTA D3) 400 units TABS tablet Take 400 Units by mouth daily.  . clopidogrel (PLAVIX) 75 MG tablet Take 75 mg by mouth daily.  . diazepam (VALIUM) 5 MG tablet Take 5 mg by mouth every 8 (eight) hours as needed for anxiety.  [name] esomeprazole (NEXIUM) 40 MG capsule Take 1 capsule (40 mg total) by mouth 2 (two) times daily before a meal.  . EVENING PRIMROSE OIL PO Take 1 capsule by mouth daily.  . fish oil-omega-3 fatty acids 1000 MG capsule Take 1,000 mg by mouth daily.   . hydrochlorothiazide 25 MG tablet Take 25 mg by mouth daily.    [name] losartan (COZAAR) 50 MG tablet Take 50 mg by mouth every evening.   [name]  magnesium oxide (MAG-OX) 400 MG tablet Take 400 mg by mouth at bedtime.   . Misc Natural Products (GLUCOSAMINE CHONDROITIN TRIPLE) TABS Take 2 tablets by mouth daily.  . Multiple Vitamin (MULTIVITAMIN WITH MINERALS) TABS tablet Take 1 tablet by mouth daily.  . phenazopyridine (PYRIDIUM) 200 MG tablet Take 1 tablet (200 mg total) by mouth 3 (three) times daily as needed for pain.  . potassium chloride SA (K-DUR,KLOR-CON) 20 MEQ tablet Take 20 mEq by mouth every evening.   . traMADol (ULTRAM) 50 MG tablet Take 1 tablet (50 mg total) by mouth every 6 (six) hours as  needed (for pain.).  [name] vitamin C (ASCORBIC ACID) 500 MG tablet Take 500 mg by mouth daily.     Allergies  Allergen Reactions  . Lisinopril Cough  . Niaspan [Niacin Er] Rash    Social History   Socioeconomic History  . Marital status: Married    Spouse name: [name]  . Number of children: Not on file  . Years of education: BA  . Highest education level: Not on file  Occupational History  . Occupation: Retired  Scientific laboratory technician  . Financial resource strain: Not on file  . Food insecurity:    Worry: Not on file    Inability: Not on file  . Transportation needs:    Medical: Not on file    Non-medical: Not on file  Tobacco Use  . Smoking status: Former Smoker    Packs/day: 2.00    Years: 20.00    Pack years: 40.00    Types: Cigarettes    Last attempt to quit: 07/31/1968    Years since quitting: 49.9  . Smokeless tobacco: Never Used  Substance and Sexual Activity  . Alcohol use: Yes    Alcohol/week: 4.0 standard drinks    Types: 4 Glasses of wine per week  . Drug use: No  . Sexual activity: Not Currently  Lifestyle  . Physical activity:    Days per week: Not on file    Minutes per session: Not on file  . Stress: Not on file  Relationships  . Social connections:    Talks on phone: Not on file    Gets together: Not on file    Attends religious service: Not on file    Active member of club or organization: Not on file    Attends meetings of clubs or organizations: Not on file    Relationship status: Not on file  . Intimate partner violence:    Fear of current or ex partner: Not on file    Emotionally abused: Not on file    Physically abused: Not on file    Forced sexual activity: Not on file  Other Topics Concern  . Not on file  Social History Narrative   Lives with wife   Caffeine use: Decaf coffee, tea   Left handed     Review of Systems: General: negative for chills, fever, night sweats or weight changes.  Cardiovascular: negative for chest pain, dyspnea on  exertion, edema, orthopnea, palpitations, paroxysmal nocturnal dyspnea or shortness of breath Dermatological: negative for rash Respiratory: negative for cough or wheezing Urologic: negative for hematuria Abdominal: negative for nausea, vomiting, diarrhea, bright red blood per rectum, melena, or hematemesis Neurologic: negative for visual changes, syncope, or dizziness All other systems reviewed and are otherwise negative except as noted above.    Blood pressure 132/64, pulse 72, height 5\' 4"  (1.626 m), weight 181 lb 3.2 oz (82.2 kg).  General appearance: alert  and no distress Neck: no adenopathy, no JVD, supple, symmetrical, trachea midline, thyroid not enlarged, symmetric, no tenderness/mass/nodules and Soft left carotid bruit Lungs: clear to auscultation bilaterally Heart: regular rate and rhythm, S1, S2 normal, no murmur, click, rub or gallop Extremities: extremities normal, atraumatic, no cyanosis or edema Pulses: 2+ and symmetric Skin: Skin color, texture, turgor normal. No rashes or lesions Neurologic: Alert and oriented X 3, normal strength and tone. Normal symmetric reflexes. Normal coordination and gait  EKG sinus rhythm 72 with nonspecific ST and T wave changes.  I personally reviewed this EKG.  ASSESSMENT AND PLAN:   HYPERCHOLESTEROLEMIA History of hyperlipidemia on statin therapy with lipid profile performed 09/11/2017 revealing total cholesterol of 47, LDL of 80 and HDL 48.  Essential hypertension History of essential hypertension her blood pressure measured today 132/64.  He is on atenolol, amlodipine and losartan.  Coronary atherosclerosis History of CAD status post proximal mid and distal RCA stenting, proximal AV groove circumflex stenting with a known occluded obtuse marginal branch.  His last Myoview performed in 2010 was nonischemic.  He denies chest pain or shortness of breath.  Bilateral carotid artery disease ([place]) History of right carotid endarterectomy with  recent Dopplers performed 12/19/2017 revealing a widely patent right endarterectomy site with moderate left carotid stenosis is will be followed up on annual basis.  Peripheral arterial disease (HCC) History of PAD status post [place] aortic stenting by myself 04/24/2016 with 10 mm x 29 mm long blue expandable stent.  He did have a 95% calcified distal left common femoral artery stenosis and an 80% mid right SFA stenosis with two-vessel runoff bilaterally.  Lower extremity arterial Doppler studies performed 12/19/2017 revealed a right ABI 0.88 and a left that was noncompressible.  He did have a high-frequency signal in his right common iliac artery and a moderate [place] because he signal in his left.  He complains of thigh claudication which is lifestyle limiting.  I am going to repeat his Doppler studies and arrange for him to undergo angiography and potential intervention.      [name] FACP,FACC,FAHA, [facility] 07/16/2018 11:37 AM

## 2018-07-16 NOTE — Patient Instructions (Addendum)
    [place] [address] Dept: [phone] Loc: [place]  07/16/2018  You are scheduled for a Peripheral Angiogram on Monday, January 6 with Dr. [name].  1. Please arrive at the [facility] (Main Entrance A) at [facility]: [address] [place], [place] [address] at 7:30 AM (This time is two hours before your procedure to ensure your preparation). Free valet parking service is available.   Special note: Every effort is made to have your procedure done on time. Please understand that emergencies sometimes delay scheduled procedures.  2. Diet: Do not eat solid foods after midnight.  The patient may have clear liquids until 5am upon the day of the procedure.  3. Labs: You will need to have blood drawn TODAY  4. Medication instructions in preparation for your procedure:  On the morning of your procedure, take your Plavix/Clopidogrel and any morning medicines NOT listed above.  You may use sips of water.  5. Plan for one night stay--bring personal belongings. 6. Bring a current list of your medications and current insurance cards. 7. You MUST have a responsible person to drive you home. 8. Someone MUST be with you the first 24 hours after you arrive home or your discharge will be delayed. 9. Please wear clothes that are easy to get on and off and wear slip-on shoes.  Thank you for allowing [place] to care for you!   -- [facility]   Your physician has requested that you have a lower extremity arterial duplex. This test is an ultrasound of the arteries in the legs. It looks at arterial blood flow in the legs. Allow one hour for Lower and Upper Arterial scans. There are no restrictions or special instructions.  SCHEDULE NOW  (PRE PROCEDURE) AND SCHEDULE FOR POST PROCEDURE (1-2 WEEKS AFTER 08/05/2018)  Your  physician has requested that you have an abdominal aorta duplex. During this test, an ultrasound is used to evaluate the aorta. Allow 30 minutes for this exam. Do not eat after midnight the day before and avoid carbonated beverages.  SCHEDULE NOW  (PRE PROCEDURE) AND SCHEDULE FOR POST PROCEDURE (1-2 WEEKS AFTER 08/05/2018)  FOLLOW UP:  FOLLOW UP IN 2-3 WEEKS AFTER YOUR PROCEDURE WITH DR. Gwenlyn Found.

## 2018-07-16 NOTE — Telephone Encounter (Signed)
This encounter was created in error - please disregard.

## 2018-07-16 NOTE — Assessment & Plan Note (Signed)
History of right carotid endarterectomy with recent Dopplers performed 12/19/2017 revealing a widely patent right endarterectomy site with moderate left carotid stenosis is will be followed up on annual basis.

## 2018-07-16 NOTE — Patient Instructions (Signed)
Please continue to take your pantoprazole 40 mg twice a day. Try to avoid late meals or laying down right after a meal. Your pulmonary function testing today did not show any evidence for significant asthma or COPD.  This is good news. Follow with Dr Lamonte Sakai if needed for any change in your cough or shortness of breath.

## 2018-07-16 NOTE — Progress Notes (Signed)
PFT done today. 

## 2018-07-16 NOTE — Assessment & Plan Note (Signed)
History of essential hypertension her blood pressure measured today 132/64.  He is on atenolol, amlodipine and losartan.

## 2018-07-17 ENCOUNTER — Other Ambulatory Visit: Payer: Self-pay | Admitting: Cardiovascular Disease

## 2018-07-17 DIAGNOSIS — I739 Peripheral vascular disease, unspecified: Secondary | ICD-10-CM

## 2018-07-17 LAB — BASIC METABOLIC PANEL
BUN / CREAT RATIO: 23 (ref 10–24)
BUN: 21 mg/dL (ref 8–27)
CALCIUM: 10 mg/dL (ref 8.6–10.2)
CHLORIDE: 100 mmol/L (ref 96–106)
CO2: 25 mmol/L (ref 20–29)
Creatinine, Ser: 0.91 mg/dL (ref 0.76–1.27)
GFR calc non Af Amer: 77 mL/min/{1.73_m2} (ref >59)
GFR, EST AFRICAN AMERICAN: 89 mL/min/{1.73_m2} (ref >59)
Glucose: 133 mg/dL — ABNORMAL HIGH (ref 65–99)
POTASSIUM: 4.3 mmol/L (ref 3.5–5.2)
SODIUM: 142 mmol/L (ref 134–144)

## 2018-07-17 LAB — CBC
Hematocrit: 35.6 % — ABNORMAL LOW (ref 37.5–51.0)
Hemoglobin: 11.4 g/dL — ABNORMAL LOW (ref 13.0–17.7)
MCH: 26.8 pg (ref 26.6–33.0)
MCHC: 32 g/dL (ref 31.5–35.7)
MCV: 84 fL (ref 79–97)
PLATELETS: 248 10*3/uL (ref 150–450)
RBC: 4.25 x10E6/uL (ref 4.14–5.80)
RDW: 13.4 % (ref 12.3–15.4)
WBC: 7.4 10*3/uL (ref 3.4–10.8)

## 2018-07-23 ENCOUNTER — Telehealth: Payer: Self-pay | Admitting: Emergency Medicine

## 2018-07-23 MED ORDER — PANTOPRAZOLE SODIUM 40 MG PO TBEC: 40.0000 mg | DELAYED_RELEASE_TABLET | Freq: Two times a day (BID) | ORAL | 2 refills | Status: DC

## 2018-07-23 NOTE — Telephone Encounter (Signed)
Called and spoke to [place] with [facility], who is requesting a refill on protonix 40mg  bid. Rx for Protonix 40mg  has been sent to [facility], as instructed in last OV note.  Nothing further is needed.    Instructions   Please continue to take your pantoprazole 40 mg twice a day. Try to avoid late meals or laying down right after a meal. Your pulmonary function testing today did not show any evidence for significant asthma or COPD.  This is good news. Follow with Dr Lamonte Sakai if needed for any change in your cough or shortness of breath.

## 2018-07-30 ENCOUNTER — Other Ambulatory Visit: Payer: Self-pay | Admitting: Cardiovascular Disease

## 2018-07-30 DIAGNOSIS — I2510 Atherosclerotic heart disease of native coronary artery without angina pectoris: Secondary | ICD-10-CM

## 2018-07-30 DIAGNOSIS — I739 Peripheral vascular disease, unspecified: Secondary | ICD-10-CM

## 2018-08-01 ENCOUNTER — Encounter: Payer: Self-pay | Admitting: Neurology

## 2018-08-01 ENCOUNTER — Encounter

## 2018-08-01 ENCOUNTER — Other Ambulatory Visit: Payer: Self-pay | Admitting: Cardiovascular Disease

## 2018-08-01 ENCOUNTER — Ambulatory Visit: Payer: Medicare Other | Admitting: Neurology

## 2018-08-01 VITALS — BP 134/61 | HR 61 | Ht 64.0 in | Wt 178.0 lb

## 2018-08-01 DIAGNOSIS — R269 Unspecified abnormalities of gait and mobility: Secondary | ICD-10-CM | POA: Diagnosis not present

## 2018-08-01 DIAGNOSIS — I2510 Atherosclerotic heart disease of native coronary artery without angina pectoris: Secondary | ICD-10-CM

## 2018-08-01 DIAGNOSIS — Z95820 Peripheral vascular angioplasty status with implants and grafts: Secondary | ICD-10-CM

## 2018-08-01 DIAGNOSIS — I739 Peripheral vascular disease, unspecified: Secondary | ICD-10-CM

## 2018-08-01 DIAGNOSIS — R42 Dizziness and giddiness: Secondary | ICD-10-CM

## 2018-08-01 NOTE — Progress Notes (Signed)
Reason for visit: Dizziness, vertigo, gait disturbance  Nathan Santiago is an 85 y.o. male  History of present illness:  Nathan Santiago is an 85 year old left-handed white male with a history of inner ear disease since the 80s.  The patient may have episodes of vertigo with rolling over in bed, he has had a persistent chronic gait disorder with a tendency to veer to the left that has been persistent over many years.  He does have a history of cerebrovascular disease, he has had a right carotid endarterectomy in the past.  Recent MRI of the brain done in 2018 showed very minimal white matter changes, no significant white matter disease affecting the brainstem or cerebellum.  The patient has had some episodes of worsening of gait instability that have occurred twice in 2019, once in April 2019 and then again in mid December 2019.  The patient indicated that the most recent episode lasted about a week and then cleared up, he now feels back to normal.  Diazepam may help during these events.  The patient will take 2.5 mg up to 3 times a day if needed.  The patient has not had any falls, he does not use a cane or a walker.  He reports no headache, double vision, slurred speech, or numbness or weakness of the face, arms, or legs with these events.  The patient feels unsteady only when he is up on his feet, he has no sensation of dizziness with sitting.  The patient is on 4 different blood pressure medications, he does not check his own blood pressures at home.  Past Medical History:  Diagnosis Date  . AAA (abdominal aortic aneurysm) (HCC)    asymptomatic  . AAA (abdominal aortic aneurysm) ([place]) 2010   peripheral  angiogram-- bilateral SFA DISEASE  and 60 to 70% infrarenaal abd. aortic stenosis with 15 -mm gradient  . Adenomatous colon polyp 11/2003  . CAD (coronary artery disease)   . Gait abnormality 02/13/2017  . Gastroparesis    pt denies  . GERD (gastroesophageal reflux disease)    w/ LPR    . History of kidney stones    x2  . Hyperlipidemia   . Hypertension   . Peripheral arterial disease ([place])    RCEA  by Nathan Santiago  . PUD (peptic ulcer disease)    pt unaware  . Vertigo     Past Surgical History:  Procedure Laterality Date  . AORTOGRAM  04/24/2016    Abdominal aortogram, bilateral iliac angiogram, bifemoral runoff  . CARDIAC CATHETERIZATION  05/12/2005   RCA  . carotid doppler  01/24/2013   RICA endarterectomy,left CCA 0-49%; left bulb and prox ICA 50-69%; bilaateral subclavian < 50%  . CAROTID ENDARTERECTOMY  11/01/2010  . CATARACT EXTRACTION    . COLONOSCOPY    . CORONARY ANGIOPLASTY  05/18/2005   2 STENTS distal RCA AND PROXIMAL-MID RCA   5 total stents per pt.  . CYSTOSCOPY/URETEROSCOPY/HOLMIUM LASER/STENT PLACEMENT Left 01/11/2018   Procedure: LEFT URETEROSCOPY/HOLMIUM LASER/STENT PLACEMENT;  Surgeon: Nathan Hughs, MD;  Location: [facility];  Service: Urology;  Laterality: Left;  . DOPPLER ECHOCARDIOGRAPHY  02/07/2012   EF 55%,SHOWED NO ISCHEMIA   . HERNIA REPAIR     umbilical  . lower arterial  doppler  02/04/2013   aotra 1.5 x 1.5 cm; distal abd aorta 70-99%,proximal common iliac arteries -very stenotic with increased velocities>50%,may be falsely elevated as a result of residual plaque from the distal aorta stenosis  .  lower extremity doppler  June 18 ,2013   ABI'S ABNORMAL, RABI was 0.88 and LABI 0.75 ,with 3-vessel  run off  . NM MYOVIEW LTD  MAY [id number]   showed no significant ischemia;  . NM MYOVIEW LTD  04/22/2008   ef 77%,exercise capcity 6 METS ,exaggerated blood pressure response to exercise  . PERIPHERAL VASCULAR CATHETERIZATION N/A 04/24/2016   Procedure: Lower Extremity Angiography;  Surgeon: Nathan Harp, MD;  Location: [place];  Service: Cardiovascular;  Laterality: N/A;  . PERIPHERAL VASCULAR CATHETERIZATION  04/24/2016   Procedure: Peripheral Vascular Intervention;  Surgeon: Nathan Harp, MD;  Location: [place]  [facility];  Service: Cardiovascular;;  Aorta  . retrograde central aortic catheterization  05/19/2005   cutting balloon atherectomy, c-circ stenosis with DES STENTING CYPHER  . TONSILLECTOMY    . TRANSURETHRAL RESECTION OF PROSTATE N/A 09/08/2016   Procedure: TRANSURETHRAL RESECTION OF THE PROSTATE (TURP);  Surgeon: Nathan Hughs, MD;  Location: [facility];  Service: Urology;  Laterality: N/A;  . TRANSURETHRAL RESECTION OF PROSTATE     10-10-17  Nathan Santiago  . TRANSURETHRAL RESECTION OF PROSTATE N/A 10/10/2017   Procedure: TRANSURETHRAL RESECTION OF THE PROSTATE (TURP);  Surgeon: Nathan Hughs, MD;  Location: [facility];  Service: Urology;  Laterality: N/A;  . VASECTOMY      Family History  Problem Relation Age of Onset  . Ovarian cancer Sister   . Heart disease Father   . Brain cancer Brother        Tumor    Social history:  reports that he quit smoking about 50 years ago. His smoking use included cigarettes. He has a 40.00 pack-year smoking history. He has never used smokeless tobacco. He reports current alcohol use of about 4.0 standard drinks of alcohol per week. He reports that he does not use drugs.    Allergies  Allergen Reactions  . Lisinopril Cough  . Niaspan [Niacin Er] Rash    Medications:  Prior to Admission medications   Medication Sig Start Date End Date Taking? Authorizing Provider  amLODipine (NORVASC) 5 MG tablet Take 5 mg by mouth daily.     Yes [provider]  aspirin EC 81 MG tablet Take 81 mg by mouth daily.   Yes [provider]  atenolol (TENORMIN) 50 MG tablet Take 50 mg by mouth daily.     Yes [provider]  atorvastatin (LIPITOR) 40 MG tablet Take 40 mg by mouth daily at 6 PM.  11/14/16  Yes [provider]  benzonatate (TESSALON) 100 MG capsule Take 100 mg by mouth 3 (three) times daily as needed for cough.  08/02/14  Yes [provider]  Carboxymethylcellul-Glycerin (LUBRICATING EYE DROPS OP) Place 1 drop into  both eyes 3 (three) times daily as needed (dry eyes).    Yes [provider]  cholecalciferol (DELTA D3) 400 units TABS tablet Take 400 Units by mouth daily.   Yes [provider]  clopidogrel (PLAVIX) 75 MG tablet Take 75 mg by mouth every evening.    Yes [provider]  diazepam (VALIUM) 5 MG tablet Take 5 mg by mouth every 8 (eight) hours as needed for anxiety.   Yes [provider]  EVENING PRIMROSE OIL PO Take 1 capsule by mouth daily.   Yes [provider]  hydrochlorothiazide 25 MG tablet Take 25 mg by mouth daily.     Yes [provider]  losartan (COZAAR) 50 MG tablet Take 50 mg by  mouth every evening.    Yes [provider]  magnesium oxide (MAG-OX) 400 MG tablet Take 400 mg by mouth daily.    Yes [provider]  Misc Natural Products (GLUCOSAMINE CHONDROITIN TRIPLE) TABS Take 1 tablet by mouth 2 (two) times daily.    Yes [provider]  Multiple Vitamin (MULTIVITAMIN WITH MINERALS) TABS tablet Take 1 tablet by mouth daily.   Yes [provider]  pantoprazole (PROTONIX) 40 MG tablet Take 1 tablet (40 mg total) by mouth 2 (two) times daily. 07/23/18  Yes Collene Gobble, MD  potassium chloride SA (K-DUR,KLOR-CON) 20 MEQ tablet Take 20 mEq by mouth every evening.  07/16/13  Yes [provider]  traMADol (ULTRAM) 50 MG tablet Take 1 tablet (50 mg total) by mouth every 6 (six) hours as needed (for pain.). 01/11/18  Yes Nathan Hughs, MD  vitamin C (ASCORBIC ACID) 500 MG tablet Take 500 mg by mouth daily.   Yes [provider]    ROS:  Out of a complete 14 system review of symptoms, the patient complains only of the following symptoms, and all other reviewed systems are negative.  Dizziness, gait instability  Blood pressure 134/61, pulse 61, height 5\' 4"  (1.626 m), weight 178 lb (80.7 kg).   Blood pressure, right arm, sitting is 124/60.  Blood pressure, right arm, standing  is 122/58.  Physical Exam  General: The patient is alert and cooperative at the time of the examination.  The patient is moderately obese.  Skin: No significant peripheral edema is noted.   Neurologic Exam  Mental status: The patient is alert and oriented x 3 at the time of the examination. The patient has apparent normal recent and remote memory, with an apparently normal attention span and concentration ability.   Cranial nerves: Facial symmetry is present. Speech is normal, no aphasia or dysarthria is noted. Extraocular movements are full. Visual fields are full.  Motor: The patient has good strength in all 4 extremities.  Sensory examination: Soft touch sensation is symmetric on the face, arms, and legs.  Coordination: The patient has good finger-nose-finger and heel-to-shin bilaterally.  Gait and station: The patient has a normal gait. Tandem gait is slightly unsteady. Romberg is negative. No drift is seen.  Reflexes: Deep tendon reflexes are symmetric.    MRI brain 11/22/16:  IMPRESSION: Chronic microvascular disease without acute intracranial Abnormality.  * MRI scan images were reviewed online. I agree with the written report.    Assessment/Plan:  1.  History of vestibular dysfunction  2.  Chronic gait disorder, recent exacerbation  The patient reports a recent episode of increased postural dizziness and gait instability.  The patient is on 4 different blood pressure medications, I have asked him to get a blood pressure cuff and regularly check his blood pressures, particularly during episodes of increased symptoms.  The patient likely has a chronic gait disorder associated with chronic vestibular dysfunction.  Use of diazepam seem to help some.  The patient will follow-up in 6 months.    Jill Alexanders MD 08/01/2018 7:21 AM  [facility] [address] [place],  [address]  Phone [phone] Fax [phone]

## 2018-08-02 ENCOUNTER — Ambulatory Visit (HOSPITAL_COMMUNITY): Payer: Medicare Other

## 2018-08-02 ENCOUNTER — Ambulatory Visit (HOSPITAL_COMMUNITY)
Admission: RE | Admit: 2018-08-02 | Discharge: 2018-08-02 | Disposition: A | Discharge status: Home / Self Care | Payer: Medicare Other | Source: Ambulatory Visit | Attending: Cardiovascular Disease | Admitting: Cardiovascular Disease

## 2018-08-02 DIAGNOSIS — I2510 Atherosclerotic heart disease of native coronary artery without angina pectoris: Secondary | ICD-10-CM | POA: Diagnosis present

## 2018-08-02 DIAGNOSIS — Z95820 Peripheral vascular angioplasty status with implants and grafts: Secondary | ICD-10-CM | POA: Insufficient documentation

## 2018-08-02 DIAGNOSIS — I739 Peripheral vascular disease, unspecified: Secondary | ICD-10-CM | POA: Diagnosis present

## 2018-08-05 ENCOUNTER — Encounter (HOSPITAL_COMMUNITY)
Admission: RE | Disposition: A | Discharge status: Home / Self Care | Payer: Self-pay | Source: Home / Self Care | Attending: Cardiovascular Disease

## 2018-08-05 ENCOUNTER — Encounter (HOSPITAL_COMMUNITY): Payer: Self-pay | Admitting: Cardiovascular Disease

## 2018-08-05 ENCOUNTER — Other Ambulatory Visit: Payer: Self-pay

## 2018-08-05 ENCOUNTER — Ambulatory Visit (HOSPITAL_COMMUNITY)
Admission: RE | Admit: 2018-08-05 | Discharge: 2018-08-05 | Disposition: A | Discharge status: Home / Self Care | Payer: Medicare Other | Attending: Cardiovascular Disease | Admitting: Cardiovascular Disease

## 2018-08-05 DIAGNOSIS — E7800 Pure hypercholesterolemia, unspecified: Secondary | ICD-10-CM | POA: Diagnosis not present

## 2018-08-05 DIAGNOSIS — I739 Peripheral vascular disease, unspecified: Secondary | ICD-10-CM

## 2018-08-05 DIAGNOSIS — I10 Essential (primary) hypertension: Secondary | ICD-10-CM | POA: Insufficient documentation

## 2018-08-05 DIAGNOSIS — Z888 Allergy status to other drugs, medicaments and biological substances status: Secondary | ICD-10-CM | POA: Insufficient documentation

## 2018-08-05 DIAGNOSIS — M25562 Pain in left knee: Secondary | ICD-10-CM | POA: Insufficient documentation

## 2018-08-05 DIAGNOSIS — Z79899 Other long term (current) drug therapy: Secondary | ICD-10-CM | POA: Diagnosis not present

## 2018-08-05 DIAGNOSIS — I6523 Occlusion and stenosis of bilateral carotid arteries: Secondary | ICD-10-CM | POA: Insufficient documentation

## 2018-08-05 DIAGNOSIS — Z87891 Personal history of nicotine dependence: Secondary | ICD-10-CM | POA: Insufficient documentation

## 2018-08-05 DIAGNOSIS — I2510 Atherosclerotic heart disease of native coronary artery without angina pectoris: Secondary | ICD-10-CM | POA: Insufficient documentation

## 2018-08-05 DIAGNOSIS — Z7902 Long term (current) use of antithrombotics/antiplatelets: Secondary | ICD-10-CM | POA: Diagnosis not present

## 2018-08-05 DIAGNOSIS — Z7982 Long term (current) use of aspirin: Secondary | ICD-10-CM | POA: Insufficient documentation

## 2018-08-05 DIAGNOSIS — I70213 Atherosclerosis of native arteries of extremities with intermittent claudication, bilateral legs: Secondary | ICD-10-CM | POA: Diagnosis present

## 2018-08-05 DIAGNOSIS — I7092 Chronic total occlusion of artery of the extremities: Secondary | ICD-10-CM

## 2018-08-05 DIAGNOSIS — M25561 Pain in right knee: Secondary | ICD-10-CM | POA: Insufficient documentation

## 2018-08-05 HISTORY — PX: ABDOMINAL AORTOGRAM W/LOWER EXTREMITY: CATH118223

## 2018-08-05 SURGERY — ABDOMINAL AORTOGRAM W/LOWER EXTREMITY
Anesthesia: LOCAL | Laterality: Bilateral

## 2018-08-05 MED ORDER — SODIUM CHLORIDE 0.9% FLUSH: 3.0000 mL | Freq: Two times a day (BID) | INTRAVENOUS | Status: DC

## 2018-08-05 MED ORDER — MIDAZOLAM HCL 2 MG/2ML IJ SOLN
INTRAMUSCULAR | Status: DC | PRN
  Administered 2018-08-05: 1 mg via INTRAVENOUS

## 2018-08-05 MED ORDER — ACETAMINOPHEN 325 MG PO TABS: 650.0000 mg | ORAL_TABLET | ORAL | Status: DC | PRN

## 2018-08-05 MED ORDER — CLOPIDOGREL BISULFATE 75 MG PO TABS
75.0000 mg | ORAL_TABLET | Freq: Once | ORAL | Status: AC
  Administered 2018-08-05: 75 mg via ORAL
  Filled 2018-08-05: qty 1

## 2018-08-05 MED ORDER — SODIUM CHLORIDE 0.9 % IV SOLN: 250.0000 mL | INTRAVENOUS | Status: DC | PRN

## 2018-08-05 MED ORDER — HYDRALAZINE HCL 20 MG/ML IJ SOLN: 5.0000 mg | INTRAMUSCULAR | Status: DC | PRN

## 2018-08-05 MED ORDER — SODIUM CHLORIDE 0.9 % WEIGHT BASED INFUSION: 1.0000 mL/kg/h | INTRAVENOUS | Status: DC

## 2018-08-05 MED ORDER — LIDOCAINE HCL (PF) 1 % IJ SOLN
INTRAMUSCULAR | Status: DC | PRN
  Administered 2018-08-05: 28 mL

## 2018-08-05 MED ORDER — ASPIRIN 81 MG PO CHEW
81.0000 mg | CHEWABLE_TABLET | ORAL | Status: AC
  Administered 2018-08-05: 81 mg via ORAL
  Filled 2018-08-05: qty 1

## 2018-08-05 MED ORDER — FENTANYL CITRATE (PF) 100 MCG/2ML IJ SOLN
INTRAMUSCULAR | Status: DC | PRN
  Administered 2018-08-05: 25 ug via INTRAVENOUS

## 2018-08-05 MED ORDER — MORPHINE SULFATE (PF) 10 MG/ML IV SOLN: 2.0000 mg | INTRAVENOUS | Status: DC | PRN

## 2018-08-05 MED ORDER — LABETALOL HCL 5 MG/ML IV SOLN: 10.0000 mg | INTRAVENOUS | Status: DC | PRN

## 2018-08-05 MED ORDER — NITROGLYCERIN 1 MG/10 ML FOR IR/CATH LAB
INTRA_ARTERIAL | Status: DC | PRN
  Administered 2018-08-05: 200 ug

## 2018-08-05 MED ORDER — ASPIRIN EC 81 MG PO TBEC: 81.0000 mg | DELAYED_RELEASE_TABLET | Freq: Every day | ORAL | Status: DC

## 2018-08-05 MED ORDER — CLOPIDOGREL BISULFATE 75 MG PO TABS: 75.0000 mg | ORAL_TABLET | Freq: Every day | ORAL | Status: DC

## 2018-08-05 MED ORDER — HEPARIN (PORCINE) IN NACL 1000-0.9 UT/500ML-% IV SOLN
INTRAVENOUS | Status: AC
  Filled 2018-08-05: qty 500

## 2018-08-05 MED ORDER — SODIUM CHLORIDE 0.9% FLUSH: 3.0000 mL | INTRAVENOUS | Status: DC | PRN

## 2018-08-05 MED ORDER — ATORVASTATIN CALCIUM 80 MG PO TABS: 80.0000 mg | ORAL_TABLET | Freq: Every day | ORAL | Status: DC

## 2018-08-05 MED ORDER — IODIXANOL 320 MG/ML IV SOLN
INTRAVENOUS | Status: DC | PRN
  Administered 2018-08-05: 112 mL via INTRA_ARTERIAL

## 2018-08-05 MED ORDER — NITROGLYCERIN 1 MG/10 ML FOR IR/CATH LAB
INTRA_ARTERIAL | Status: AC
  Filled 2018-08-05: qty 10

## 2018-08-05 MED ORDER — ONDANSETRON HCL 4 MG/2ML IJ SOLN: 4.0000 mg | Freq: Four times a day (QID) | INTRAMUSCULAR | Status: DC | PRN

## 2018-08-05 MED ORDER — MIDAZOLAM HCL 2 MG/2ML IJ SOLN
INTRAMUSCULAR | Status: AC
  Filled 2018-08-05: qty 2

## 2018-08-05 MED ORDER — SODIUM CHLORIDE 0.9 % WEIGHT BASED INFUSION
3.0000 mL/kg/h | INTRAVENOUS | Status: AC
  Administered 2018-08-05: 3 mL/kg/h via INTRAVENOUS

## 2018-08-05 MED ORDER — SODIUM CHLORIDE 0.9 % IV SOLN: INTRAVENOUS | Status: AC

## 2018-08-05 MED ORDER — HEPARIN (PORCINE) IN NACL 1000-0.9 UT/500ML-% IV SOLN
INTRAVENOUS | Status: DC | PRN
  Administered 2018-08-05 (×2): 500 mL

## 2018-08-05 MED ORDER — FENTANYL CITRATE (PF) 100 MCG/2ML IJ SOLN
INTRAMUSCULAR | Status: AC
  Filled 2018-08-05: qty 2

## 2018-08-05 SURGICAL SUPPLY — 14 items
CATH ANGIO 5F BER2 65CM (CATHETERS) ×1 IMPLANT
CATH ANGIO 5F PIGTAIL 65CM (CATHETERS) ×1 IMPLANT
CATH SOFT-VU 4F 65 STRAIGHT (CATHETERS) IMPLANT
CATH SOFT-VU STRAIGHT 4F 65CM (CATHETERS) ×1
CLOSURE MYNX CONTROL 5F (Vascular Products) ×1 IMPLANT
KIT PV (KITS) ×2 IMPLANT
SHEATH PINNACLE 5F 10CM (SHEATH) ×1 IMPLANT
SHEATH PROBE COVER 6X72 (BAG) ×1 IMPLANT
STOPCOCK MORSE 400PSI 3WAY (MISCELLANEOUS) ×1 IMPLANT
SYR MEDRAD MARK 7 150ML (SYRINGE) ×2 IMPLANT
TRANSDUCER W/STOPCOCK (MISCELLANEOUS) ×2 IMPLANT
TRAY PV CATH (CUSTOM PROCEDURE TRAY) ×2 IMPLANT
TUBING CIL FLEX 10 FLL-RA (TUBING) ×1 IMPLANT
WIRE HITORQ VERSACORE ST 145CM (WIRE) ×1 IMPLANT

## 2018-08-05 NOTE — Discharge Instructions (Signed)
Femoral Site Care °This sheet gives you information about how to care for yourself after your procedure. Your health care provider may also give you more specific instructions. If you have problems or questions, contact your health care provider. °What can I expect after the procedure? °After the procedure, it is common to have: °· Bruising that usually fades within 1-2 weeks. °· Tenderness at the site. °Follow these instructions at home: °Wound care °· Follow instructions from your health care provider about how to take care of your insertion site. Make sure you: °? Wash your hands with soap and water before you change your bandage (dressing). If soap and water are not available, use hand sanitizer. °? Change your dressing as told by your health care provider. °? Leave stitches (sutures), skin glue, or adhesive strips in place. These skin closures may need to stay in place for 2 weeks or longer. If adhesive strip edges start to loosen and curl up, you may trim the loose edges. Do not remove adhesive strips completely unless your health care provider tells you to do that. °· Do not take baths, swim, or use a hot tub until your health care provider approves. °· You may shower 24-48 hours after the procedure or as told by your health care provider. °? Gently wash the site with plain soap and water. °? Pat the area dry with a clean towel. °? Do not rub the site. This may cause bleeding. °· Do not apply powder or lotion to the site. Keep the site clean and dry. °· Check your femoral site every day for signs of infection. Check for: °? Redness, swelling, or pain. °? Fluid or blood. °? Warmth. °? Pus or a bad smell. °Activity °· For the first 2-3 days after your procedure, or as long as directed: °? Avoid climbing stairs as much as possible. °? Do not squat. °· Do not lift anything that is heavier than 10 lb (4.5 kg), or the limit that you are told, until your health care provider says that it is safe. °· Rest as  directed. °? Avoid sitting for a long time without moving. Get up to take short walks every 1-2 hours. °· Do not drive for 24 hours if you were given a medicine to help you relax (sedative). °General instructions °· Take over-the-counter and prescription medicines only as told by your health care provider. °· Keep all follow-up visits as told by your health care provider. This is important. °Contact a health care provider if you have: °· A fever or chills. °· You have redness, swelling, or pain around your insertion site. °Get help right away if: °· The catheter insertion area swells very fast. °· You pass out. °· You suddenly start to sweat or your skin gets clammy. °· The catheter insertion area is bleeding, and the bleeding does not stop when you hold steady pressure on the area. °· The area near or just beyond the catheter insertion site becomes pale, cool, tingly, or numb. °These symptoms may represent a serious problem that is an emergency. Do not wait to see if the symptoms will go away. Get medical help right away. Call your local emergency services (911 in the [place] Do not drive yourself to the hospital. °Summary °· After the procedure, it is common to have bruising that usually fades within 1-2 weeks. °· Check your femoral site every day for signs of infection. °· Do not lift anything that is heavier than 10 lb (4.5 kg), or the   limit that you are told, until your health care provider says that it is safe. °This information is not intended to replace advice given to you by your health care provider. Make sure you discuss any questions you have with your health care provider. °Document Released: 03/20/2014 Document Revised: 07/30/2017 Document Reviewed: 07/30/2017 °Elsevier Interactive Patient Education © 2019 [employer] Inc[employer] ° °

## 2018-08-05 NOTE — Interval H&P Note (Signed)
History and Physical Interval Note:  08/05/2018 8:16 AM  Nathan Santiago  has presented today for surgery, with the diagnosis of pvd  The various methods of treatment have been discussed with the patient and family. After consideration of risks, benefits and other options for treatment, the patient has consented to  Procedure(s): ABDOMINAL AORTOGRAM W/LOWER EXTREMITY (N/A) as a surgical intervention .  The patient's history has been reviewed, patient examined, no change in status, stable for surgery.  I have reviewed the patient's chart and labs.  Questions were answered to the patient's satisfaction.     [name]

## 2018-08-20 ENCOUNTER — Ambulatory Visit: Payer: Medicare Other | Admitting: Podiatry

## 2018-08-20 ENCOUNTER — Ambulatory Visit (INDEPENDENT_AMBULATORY_CARE_PROVIDER_SITE_OTHER): Payer: Medicare Other

## 2018-08-20 ENCOUNTER — Encounter: Payer: Self-pay | Admitting: Podiatry

## 2018-08-20 VITALS — BP 149/73 | HR 70

## 2018-08-20 DIAGNOSIS — M722 Plantar fascial fibromatosis: Secondary | ICD-10-CM | POA: Diagnosis not present

## 2018-08-20 MED ORDER — METHYLPREDNISOLONE 4 MG PO TBPK: ORAL_TABLET | ORAL | 0 refills | Status: DC

## 2018-08-20 NOTE — Progress Notes (Signed)
Subjective:  Patient ID: Nathan Santiago, male    DOB: 1930/10/11,  MRN: [id number] HPI Chief Complaint  Patient presents with  . Foot Pain    NP) Bilateral heel pain for months - some days worse than others    85 y.o. male presents with the above complaint.   ROS: Denies fever chills nausea vomiting muscle aches pains calf pain back pain chest pain shortness of breath.  Past Medical History:  Diagnosis Date  . AAA (abdominal aortic aneurysm) ([facility])    asymptomatic  . AAA (abdominal aortic aneurysm) ([place]) 2010   peripheral  angiogram-- bilateral SFA DISEASE  and 60 to 70% infrarenaal abd. aortic stenosis with 15 -mm gradient  . Adenomatous colon polyp 11/2003  . CAD (coronary artery disease)   . Gait abnormality 02/13/2017  . Gastroparesis    pt denies  . GERD (gastroesophageal reflux disease)    w/ LPR  . History of kidney stones    x2  . Hyperlipidemia   . Hypertension   . Peripheral arterial disease ([facility])    RCEA  by Dr Lemar Livings  . PUD (peptic ulcer disease)    pt unaware  . Vertigo    Past Surgical History:  Procedure Laterality Date  . ABDOMINAL AORTOGRAM W/LOWER EXTREMITY Bilateral 08/05/2018   Procedure: ABDOMINAL AORTOGRAM W/LOWER EXTREMITY;  Surgeon: Lorretta Harp, MD;  Location: [facility];  Service: Cardiovascular;  Laterality: Bilateral;  . AORTOGRAM  04/24/2016    Abdominal aortogram, bilateral iliac angiogram, bifemoral runoff  . CARDIAC CATHETERIZATION  05/12/2005   RCA  . carotid doppler  01/24/2013   RICA endarterectomy,left CCA 0-49%; left bulb and prox ICA 50-69%; bilaateral subclavian < 50%  . CAROTID ENDARTERECTOMY  11/01/2010  . CATARACT EXTRACTION    . COLONOSCOPY    . CORONARY ANGIOPLASTY  05/18/2005   2 STENTS distal RCA AND PROXIMAL-MID RCA   5 total stents per pt.  . CYSTOSCOPY/URETEROSCOPY/HOLMIUM LASER/STENT PLACEMENT Left 01/11/2018   Procedure: LEFT URETEROSCOPY/HOLMIUM LASER/STENT PLACEMENT;  Surgeon: Ardis Hughs, MD;  Location: [facility];  Service: Urology;  Laterality: Left;  . DOPPLER ECHOCARDIOGRAPHY  02/07/2012   EF 55%,SHOWED NO ISCHEMIA   . HERNIA REPAIR     umbilical  . lower arterial  doppler  02/04/2013   aotra 1.5 x 1.5 cm; distal abd aorta 70-99%,proximal common iliac arteries -very stenotic with increased velocities>50%,may be falsely elevated as a result of residual plaque from the distal aorta stenosis  . lower extremity doppler  June 18 ,2013   ABI'S ABNORMAL, RABI was 0.88 and LABI 0.75 ,with 3-vessel  run off  . NM MYOVIEW LTD  MAY [id number]   showed no significant ischemia;  . NM MYOVIEW LTD  04/22/2008   ef 77%,exercise capcity 6 METS ,exaggerated blood pressure response to exercise  . PERIPHERAL VASCULAR CATHETERIZATION N/A 04/24/2016   Procedure: Lower Extremity Angiography;  Surgeon: Lorretta Harp, MD;  Location: [facility];  Service: Cardiovascular;  Laterality: N/A;  . PERIPHERAL VASCULAR CATHETERIZATION  04/24/2016   Procedure: Peripheral Vascular Intervention;  Surgeon: Lorretta Harp, MD;  Location: [facility];  Service: Cardiovascular;;  Aorta  . retrograde central aortic catheterization  05/19/2005   cutting balloon atherectomy, c-circ stenosis with DES STENTING CYPHER  . TONSILLECTOMY    . TRANSURETHRAL RESECTION OF PROSTATE N/A 09/08/2016   Procedure: TRANSURETHRAL RESECTION OF THE PROSTATE (TURP);  Surgeon: Ardis Hughs, MD;  Location: [facility];  Service: Urology;  Laterality: N/A;  . TRANSURETHRAL RESECTION OF PROSTATE     10-10-17  Dr. Louis Meckel  . TRANSURETHRAL RESECTION OF PROSTATE N/A 10/10/2017   Procedure: TRANSURETHRAL RESECTION OF THE PROSTATE (TURP);  Surgeon: Ardis Hughs, MD;  Location: [facility];  Service: Urology;  Laterality: N/A;  . VASECTOMY      Current Outpatient Medications:  .  amLODipine (NORVASC) 5 MG tablet, Take 5 mg by mouth daily.  , Disp: , Rfl:  .  aspirin EC 81 MG tablet, Take 81 mg by mouth daily., Disp: , Rfl:  .   atenolol (TENORMIN) 50 MG tablet, Take 50 mg by mouth daily.  , Disp: , Rfl:  .  atorvastatin (LIPITOR) 40 MG tablet, Take 40 mg by mouth daily at 6 PM. , Disp: , Rfl:  .  benzonatate (TESSALON) 100 MG capsule, Take 100 mg by mouth 3 (three) times daily as needed for cough. , Disp: , Rfl: 0 .  Carboxymethylcellul-Glycerin (LUBRICATING EYE DROPS OP), Place 1 drop into both eyes 3 (three) times daily as needed (dry eyes). , Disp: , Rfl:  .  cholecalciferol (DELTA D3) 400 units TABS tablet, Take 400 Units by mouth daily., Disp: , Rfl:  .  clopidogrel (PLAVIX) 75 MG tablet, Take 75 mg by mouth every evening. , Disp: , Rfl:  .  diazepam (VALIUM) 5 MG tablet, Take 5 mg by mouth every 8 (eight) hours as needed for anxiety., Disp: , Rfl:  .  EVENING PRIMROSE OIL PO, Take 1 capsule by mouth daily., Disp: , Rfl:  .  hydrochlorothiazide 25 MG tablet, Take 25 mg by mouth daily.  , Disp: , Rfl:  .  losartan (COZAAR) 50 MG tablet, Take 50 mg by mouth every evening. , Disp: , Rfl:  .  magnesium oxide (MAG-OX) 400 MG tablet, Take 400 mg by mouth daily. , Disp: , Rfl:  .  methylPREDNISolone (MEDROL DOSEPAK) 4 MG TBPK tablet, 6 day dose pack - take as directed, Disp: 21 tablet, Rfl: 0 .  Misc Natural Products (GLUCOSAMINE CHONDROITIN TRIPLE) TABS, Take 1 tablet by mouth 2 (two) times daily. , Disp: , Rfl:  .  Multiple Vitamin (MULTIVITAMIN WITH MINERALS) TABS tablet, Take 1 tablet by mouth daily., Disp: , Rfl:  .  pantoprazole (PROTONIX) 40 MG tablet, Take 1 tablet (40 mg total) by mouth 2 (two) times daily., Disp: 60 tablet, Rfl: 2 .  potassium chloride SA (K-DUR,KLOR-CON) 20 MEQ tablet, Take 20 mEq by mouth every evening. , Disp: , Rfl:  .  tamsulosin (FLOMAX) 0.4 MG CAPS capsule, tamsulosin 0.4 mg capsule  TAKE ONE CAPSULE BY MOUTH EVERY EVENING, Disp: , Rfl:  .  traMADol (ULTRAM) 50 MG tablet, Take 1 tablet (50 mg total) by mouth every 6 (six) hours as needed (for pain.)., Disp: 10 tablet, Rfl: 0 .  vitamin C  (ASCORBIC ACID) 500 MG tablet, Take 500 mg by mouth daily., Disp: , Rfl:   Allergies  Allergen Reactions  . Lisinopril Cough  . Niaspan [Niacin Er] Rash   Review of Systems Objective:   Vitals:   08/20/18 0935  BP: (!) 149/73  Pulse: 70    General: Well developed, nourished, in no acute distress, alert and oriented x3   Dermatological: Skin is warm, dry and supple bilateral. Nails x 10 are well maintained; remaining integument appears unremarkable at this time. There are no open sores, no preulcerative lesions, no rash or signs of infection present.  Vascular: Dorsalis Pedis artery and Posterior Tibial  artery pedal pulses are 2/4 bilateral with immedate capillary fill time. Pedal hair growth present. No varicosities and no lower extremity edema present bilateral.   Neruologic: Grossly intact via light touch bilateral. Vibratory intact via tuning fork bilateral. Protective threshold with Semmes Wienstein monofilament intact to all pedal sites bilateral. Patellar and Achilles deep tendon reflexes 2+ bilateral. No Babinski or clonus noted bilateral.   Musculoskeletal: No gross boney pedal deformities bilateral. No pain, crepitus, or limitation noted with foot and ankle range of motion bilateral. Muscular strength 5/5 in all groups tested bilateral.  Gait: Unassisted, Nonantalgic.    Radiographs:  Radiographs taken today demonstrate mild osteoarthritis of the midfoot.  Otherwise plantar distally oriented calcaneal heel spurs with soft tissue increase in density plantar fashion calcaneal insertion site.  Assessment & Plan:   Assessment: Plantar fasciitis bilateral.  Plan: After sterile Betadine skin prep I injected 20 mg Kenalog 5 mg Marcaine point maximal tenderness bilateral heels.  Also start him on a Medrol Dosepak we will follow-up with him in 1 month discussed appropriate shoe gear stretching exercise ice therapy and shoe gear modifications.     Melisse Caetano T. [place], [place]

## 2018-08-20 NOTE — Patient Instructions (Signed)

## 2018-08-21 ENCOUNTER — Encounter (HOSPITAL_COMMUNITY): Payer: Medicare Other

## 2018-08-27 ENCOUNTER — Encounter: Payer: Self-pay | Admitting: Cardiovascular Disease

## 2018-08-27 ENCOUNTER — Ambulatory Visit: Payer: Medicare Other | Admitting: Cardiovascular Disease

## 2018-08-27 DIAGNOSIS — I739 Peripheral vascular disease, unspecified: Secondary | ICD-10-CM | POA: Diagnosis not present

## 2018-08-27 NOTE — Patient Instructions (Signed)
Medication Instructions:  NONE If you need a refill on your cardiac medications before your next appointment, please call your pharmacy.   Lab work: NONE If you have labs (blood work) drawn today and your tests are completely normal, you will receive your results only by: . MyChart Message (if you have MyChart) OR . A paper copy in the mail If you have any lab test that is abnormal or we need to change your treatment, we will call you to review the results.  Testing/Procedures: NONE  Follow-Up: At [facility], you and your health needs are our priority.  As part of our continuing mission to provide you with exceptional heart care, we have created designated Provider Care Teams.  These Care Teams include your primary Cardiologist (physician) and Advanced Practice Providers (APPs -  Physician Assistants and Nurse Practitioners) who all work together to provide you with the care you need, when you need it. . You will need a follow up appointment in 12 months.  Please call our office 2 months in advance to schedule this appointment.  You may see Dr. Berry or one of the following Advanced Practice Providers on your designated Care Team:   . Luke Kilroy, PA-C . Hao Meng, PA-C . Angela Duke, PA-C . Kathryn Lawrence, DNP . Rhonda Barrett, PA-C . Krista Kroeger, PA-C . Callie Goodrich, PA-C   

## 2018-08-27 NOTE — Assessment & Plan Note (Signed)
Nathan Santiago returns today for follow-up of his referral vascular angiogram which I performed as an outpatient 08/05/2018 because of hip and thigh claudication.  His previously placed distal aortic stent was slightly widely patent without a gradient.  His anatomy otherwise was unchanged with a 95% calcified left common femoral artery stenosis, and bilateral SFA disease.  He has seen an orthopedic surgeon who has commented to that his knees demonstrate bone-on-bone.  We will continue to follow him noninvasively.

## 2018-08-27 NOTE — Progress Notes (Signed)
08/27/2018 Nathan Santiago   [birth year]  [id number]  Primary Physician [name], MD Primary Cardiologist: [name] [name], [place], [place]  HPI:  Nathan Santiago is a 85 y.o.  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 He was formally a patient of [name]. I last saw him in the office 12/14/2017.[name] His cardiovascular status profile is remarkable for remote tobacco abuse, 2 hypertension and hyperlipidemia. He is not diabetic. He does [place] 5 days a week 25 minutes a day. He hasn't known cardiovascular disease status post stenting of his proximal mid and distal RCA using overlapping stents as well as the proximal AV groove circumflex with a known occluded OM branch which is old. He has normal LV function by 2-D echo performed 02/07/12 a nonischemic Myoview 2010. He denies chest pain, shortness of breath or claudication. He had carotid Dopplers performed 01/09/14 that showed a widely patent right carotid endarterectomy site with moderate left ICA stenosis remained stable. Since I saw him in January of this year he's noticed increasing bilateral lower extremity claudication which is lifestyle limiting. Dopplers have shown a decline in his ABIs bilaterally. He does have high frequency signal in his right greater than left iliac systems. I performed peripheral angiography on him 04/24/16 revealing a 75% distal abdominal aortic stenosis with a 60 mm pullback gradient. He also had a 95% calcified distal left common femoral artery stenosis and an 80% segmental mid right SFA stenosis with 2 vessel runoff bilaterally. I stented his distal abdominal aorta with a 10 x 29 mm long balloon expandable stent. His ABIs and increased from 0.7 bilaterally up to 1 on the right and 0.8 on the left. His claudication completely resolved. He was able to go on a trip to the [facility] and ambulated without  limitation. Since I saw him a year ago he is complained of bilateral thigh claudication which is lifestyle limiting.  His Dopplers do appear to show moderate iliac disease.  Since I saw him in the office a month ago I did perform outpatient peripheral angiography on 08/05/2018 revealing a patent distal abdominal aortic stent without a gradient and otherwise unchanged anatomy with a 95% calcified left common femoral artery stenosis and bilateral moderate SFA disease.  Current Meds  Medication Sig  . amLODipine (NORVASC) 5 MG tablet Take 5 mg by mouth daily.    [name] aspirin EC 81 MG tablet Take 81 mg by mouth daily.  [name] atenolol (TENORMIN) 50 MG tablet Take 50 mg by mouth daily.    [name] atorvastatin (LIPITOR) 40 MG tablet Take 40 mg by mouth daily at 6 PM.   . benzonatate (TESSALON) 100 MG capsule Take 100 mg by mouth 3 (three) times daily as needed for cough.   . Carboxymethylcellul-Glycerin (LUBRICATING EYE DROPS OP) Place 1 drop into both eyes 3 (three) times daily as needed (dry eyes).   . cholecalciferol (DELTA D3) 400 units TABS tablet Take 400 Units by mouth daily.  . clopidogrel (PLAVIX) 75 MG tablet Take 75 mg by mouth every evening.   . diazepam (VALIUM) 5 MG tablet Take 5 mg by mouth every 8 (eight) hours as needed for anxiety.  [name] EVENING PRIMROSE OIL PO Take 1 capsule by mouth daily.  . hydrochlorothiazide 25 MG tablet Take 25 mg by mouth daily.    [name] losartan (COZAAR) 50 MG tablet Take 50  mg by mouth every evening.   . magnesium oxide (MAG-OX) 400 MG tablet Take 400 mg by mouth daily.   . Misc Natural Products (GLUCOSAMINE CHONDROITIN TRIPLE) TABS Take 1 tablet by mouth 2 (two) times daily.   . Multiple Vitamin (MULTIVITAMIN WITH MINERALS) TABS tablet Take 1 tablet by mouth daily.  . pantoprazole (PROTONIX) 40 MG tablet Take 1 tablet (40 mg total) by mouth 2 (two) times daily.  . potassium chloride SA (K-DUR,KLOR-CON) 20 MEQ tablet Take 20 mEq by mouth every evening.   . tamsulosin (FLOMAX)  0.4 MG CAPS capsule tamsulosin 0.4 mg capsule  TAKE ONE CAPSULE BY MOUTH EVERY EVENING  . vitamin C (ASCORBIC ACID) 500 MG tablet Take 500 mg by mouth daily.     Allergies  Allergen Reactions  . Lisinopril Cough  . Niaspan [Niacin Er] Rash    Social History   Socioeconomic History  . Marital status: Married    Spouse name: [name]  . Number of children: Not on file  . Years of education: BA  . Highest education level: Not on file  Occupational History  . Occupation: Retired  Scientific laboratory technician  . Financial resource strain: Not on file  . Food insecurity:    Worry: Not on file    Inability: Not on file  . Transportation needs:    Medical: Not on file    Non-medical: Not on file  Tobacco Use  . Smoking status: Former Smoker    Packs/day: 2.00    Years: 20.00    Pack years: 40.00    Types: Cigarettes    Last attempt to quit: 07/31/1968    Years since quitting: 50.1  . Smokeless tobacco: Never Used  Substance and Sexual Activity  . Alcohol use: Yes    Alcohol/week: 4.0 standard drinks    Types: 4 Glasses of wine per week  . Drug use: No  . Sexual activity: Not Currently  Lifestyle  . Physical activity:    Days per week: Not on file    Minutes per session: Not on file  . Stress: Not on file  Relationships  . Social connections:    Talks on phone: Not on file    Gets together: Not on file    Attends religious service: Not on file    Active member of club or organization: Not on file    Attends meetings of clubs or organizations: Not on file    Relationship status: Not on file  . Intimate partner violence:    Fear of current or ex partner: Not on file    Emotionally abused: Not on file    Physically abused: Not on file    Forced sexual activity: Not on file  Other Topics Concern  . Not on file  Social History Narrative   Lives with wife   Caffeine use: Decaf coffee, tea   Left handed     Review of Systems: General: negative for chills, fever, night sweats or  weight changes.  Cardiovascular: negative for chest pain, dyspnea on exertion, edema, orthopnea, palpitations, paroxysmal nocturnal dyspnea or shortness of breath Dermatological: negative for rash Respiratory: negative for cough or wheezing Urologic: negative for hematuria Abdominal: negative for nausea, vomiting, diarrhea, bright red blood per rectum, melena, or hematemesis Neurologic: negative for visual changes, syncope, or dizziness All other systems reviewed and are otherwise negative except as noted above.    Blood pressure (!) 136/56, pulse 64, height 5\' 4"  (1.626 m), weight 176 lb (79.8 kg).  General appearance: alert and no distress Neck: no adenopathy, no carotid bruit, no JVD, supple, symmetrical, trachea midline and thyroid not enlarged, symmetric, no tenderness/mass/nodules Lungs: clear to auscultation bilaterally Heart: regular rate and rhythm, S1, S2 normal, no murmur, click, rub or gallop Extremities: extremities normal, atraumatic, no cyanosis or edema Pulses: 2+ and symmetric Skin: Skin color, texture, turgor normal. No rashes or lesions Neurologic: Alert and oriented X 3, normal strength and tone. Normal symmetric reflexes. Normal coordination and gait  EKG not performed today  ASSESSMENT AND PLAN:   Peripheral arterial disease [facility]) Mr. Sattler returns today for follow-up of his referral vascular angiogram which I performed as an outpatient 08/05/2018 because of hip and thigh claudication.  His previously placed distal aortic stent was slightly widely patent without a gradient.  His anatomy otherwise was unchanged with a 95% calcified left common femoral artery stenosis, and bilateral SFA disease.  He has seen an orthopedic surgeon who has commented to that his knees demonstrate bone-on-bone.  We will continue to follow him noninvasively.      [name] FACP,FACC,FAHA, [facility] 08/27/2018 11:04 AM

## 2018-09-17 ENCOUNTER — Ambulatory Visit: Payer: Medicare Other | Admitting: Podiatry

## 2018-09-17 ENCOUNTER — Encounter: Payer: Self-pay | Admitting: Podiatry

## 2018-09-17 DIAGNOSIS — M79676 Pain in unspecified toe(s): Secondary | ICD-10-CM

## 2018-09-17 DIAGNOSIS — M722 Plantar fascial fibromatosis: Secondary | ICD-10-CM | POA: Diagnosis not present

## 2018-09-17 DIAGNOSIS — B351 Tinea unguium: Secondary | ICD-10-CM | POA: Diagnosis not present

## 2018-09-17 DIAGNOSIS — R82998 Other abnormal findings in urine: Secondary | ICD-10-CM | POA: Diagnosis not present

## 2018-09-18 NOTE — Progress Notes (Signed)
He presents today states that his heels are doing much better as he refers to them stating that they are approximately 75 to 85% improved.  He also like to have his nails trimmed if possible because are long and painful  Objective: Vital signs are stable alert oriented x3.  Toenails are long thick yellow dystrophic with mycotic sharply incurvated margins painful on palpation.  His heels are doing much better less pain on palpation but still some tenderness of medial calcaneal tubercle.  Assessment: Pain limb secondary to onychomycosis and residual plantar fasciitis.  Plan: We injected the bilateral heels today 20 mg Kenalog 5 mg Marcaine point maximal tenderness bilateral heels.  Tolerated procedure well after Betadine skin prep.  And I also debrided the nails for him.  Follow-up with him in about a month to 6 weeks.

## 2018-10-09 ENCOUNTER — Telehealth: Payer: Self-pay | Admitting: *Deleted

## 2018-10-09 NOTE — Telephone Encounter (Signed)
   [facility] Pre-operative Risk Assessment    Request for surgical clearance:  1. What type of surgery is being performed? LEFT  TOTAL KNEE ARTHROPLASTY   2. When is this surgery scheduled? 11/01/18   3. What type of clearance is required (medical clearance vs. Pharmacy clearance to hold med vs. Both)?BOTH   4. Are there any medications that need to be held prior to surgery and how long?   5. Practice name and name of physician performing surgery? [facility]    6. What is your office phone number? [phone]    7.   What is your office fax number? [phone]  8.   Anesthesia type (None, local, MAC, general) ? GENERAL

## 2018-10-10 NOTE — Telephone Encounter (Signed)
Dr. Gwenlyn Found can pt hold his plavix and ASA for 5 days prior to Lt total knee?

## 2018-10-10 NOTE — Telephone Encounter (Signed)
To clearance pool

## 2018-10-11 NOTE — Telephone Encounter (Signed)
Okay to hold antiplatelet agents for his orthopedic procedure.

## 2018-10-11 NOTE — Telephone Encounter (Signed)
   Primary Cardiologist: [name], MD  Chart reviewed as part of pre-operative protocol coverage. Patient was contacted 10/11/2018 in reference to pre-operative risk assessment for pending surgery as outlined below.  Nathan Santiago was last seen on 08/27/18 by Dr. Gwenlyn Found.  Since that day, Nathan Santiago has done well w/o anginal symptoms. No exertional CP or dyspnea.  Therefore, based on ACC/AHA guidelines, the patient would be at acceptable risk for the planned procedure without further cardiovascular testing.   Per Dr. Gwenlyn Found, ok to hold ASA and Plavix 5 days prior to procedure.   I will route this recommendation to the requesting party via Epic fax function and remove from pre-op pool.  Please call with questions.  Lyda Jester, PA-C 10/11/2018, 2:14 PM

## 2018-10-15 ENCOUNTER — Ambulatory Visit: Payer: Medicare Other | Admitting: Podiatry

## 2018-10-21 ENCOUNTER — Other Ambulatory Visit: Payer: Self-pay | Admitting: Emergency Medicine

## 2018-11-01 ENCOUNTER — Ambulatory Visit (HOSPITAL_COMMUNITY): Admission: RE | Admit: 2018-11-01 | Payer: Medicare Other | Source: Home / Self Care | Admitting: Orthopedic Surgery

## 2018-11-01 ENCOUNTER — Encounter (HOSPITAL_COMMUNITY): Admission: RE | Payer: Self-pay | Source: Home / Self Care

## 2018-11-01 SURGERY — ARTHROPLASTY, KNEE, TOTAL
Anesthesia: General | Laterality: Left

## 2018-12-10 ENCOUNTER — Other Ambulatory Visit (HOSPITAL_COMMUNITY): Payer: Self-pay | Admitting: Cardiovascular Disease

## 2018-12-10 DIAGNOSIS — I739 Peripheral vascular disease, unspecified: Secondary | ICD-10-CM

## 2018-12-17 NOTE — H&P (Signed)
Patient's anticipated LOS is less than 2 midnights, meeting these requirements: - Younger than 63 - Lives within 1 hour of care - Has a competent adult at home to recover with post-op recover - NO history of  - Chronic pain requiring opiods  - Diabetes  - Coronary Artery Disease  - Heart failure  - Heart attack  - Stroke  - DVT/VTE  - Cardiac arrhythmia  - Respiratory Failure/COPD  - Renal failure  - Anemia  - Advanced Liver disease       AURTHUR WINGERTER is an 85 y.o. male.    Chief Complaint: left knee pain  HPI: Pt is a 85 y.o. male complaining of left knee pain for multiple years. Pain had continually increased since the beginning. X-rays in the clinic show end-stage arthritic changes of the left knee. Pt has tried various conservative treatments which have failed to alleviate their symptoms, including injections and therapy. Various options are discussed with the patient. Risks, benefits and expectations were discussed with the patient. Patient understand the risks, benefits and expectations and wishes to proceed with surgery.   PCP:  [name], MD  D/C Plans: Home  PMH: Past Medical History:  Diagnosis Date  . AAA (abdominal aortic aneurysm) (HCC)    asymptomatic  . AAA (abdominal aortic aneurysm) ([facility]) 2010   peripheral  angiogram-- bilateral SFA DISEASE  and 60 to 70% infrarenaal abd. aortic stenosis with 15 -mm gradient  . Adenomatous colon polyp 11/2003  . CAD (coronary artery disease)   . Gait abnormality 02/13/2017  . Gastroparesis    pt denies  . GERD (gastroesophageal reflux disease)    w/ LPR  . History of kidney stones    x2  . Hyperlipidemia   . Hypertension   . Peripheral arterial disease ([facility])    RCEA  by Dr Lemar Livings  . PUD (peptic ulcer disease)    pt unaware  . Vertigo     PSH: Past Surgical History:  Procedure Laterality Date  . ABDOMINAL AORTOGRAM W/LOWER EXTREMITY Bilateral 08/05/2018   Procedure: ABDOMINAL AORTOGRAM  W/LOWER EXTREMITY;  Surgeon: Lorretta Harp, MD;  Location: [facility];  Service: Cardiovascular;  Laterality: Bilateral;  . AORTOGRAM  04/24/2016    Abdominal aortogram, bilateral iliac angiogram, bifemoral runoff  . CARDIAC CATHETERIZATION  05/12/2005   RCA  . carotid doppler  01/24/2013   RICA endarterectomy,left CCA 0-49%; left bulb and prox ICA 50-69%; bilaateral subclavian < 50%  . CAROTID ENDARTERECTOMY  11/01/2010  . CATARACT EXTRACTION    . COLONOSCOPY    . CORONARY ANGIOPLASTY  05/18/2005   2 STENTS distal RCA AND PROXIMAL-MID RCA   5 total stents per pt.  . CYSTOSCOPY/URETEROSCOPY/HOLMIUM LASER/STENT PLACEMENT Left 01/11/2018   Procedure: LEFT URETEROSCOPY/HOLMIUM LASER/STENT PLACEMENT;  Surgeon: Ardis Hughs, MD;  Location: [facility];  Service: Urology;  Laterality: Left;  . DOPPLER ECHOCARDIOGRAPHY  02/07/2012   EF 55%,SHOWED NO ISCHEMIA   . HERNIA REPAIR     umbilical  . lower arterial  doppler  02/04/2013   aotra 1.5 x 1.5 cm; distal abd aorta 70-99%,proximal common iliac arteries -very stenotic with increased velocities>50%,may be falsely elevated as a result of residual plaque from the distal aorta stenosis  . lower extremity doppler  June 18 ,2013   ABI'S ABNORMAL, RABI was 0.88 and LABI 0.75 ,with 3-vessel  run off  . NM MYOVIEW LTD  MAY [phone]   showed no significant ischemia;  . NM MYOVIEW LTD  04/22/2008   ef 77%,exercise capcity 6 METS ,exaggerated blood pressure response to exercise  . PERIPHERAL VASCULAR CATHETERIZATION N/A 04/24/2016   Procedure: Lower Extremity Angiography;  Surgeon: Lorretta Harp, MD;  Location: [facility];  Service: Cardiovascular;  Laterality: N/A;  . PERIPHERAL VASCULAR CATHETERIZATION  04/24/2016   Procedure: Peripheral Vascular Intervention;  Surgeon: Lorretta Harp, MD;  Location: [facility];  Service: Cardiovascular;;  Aorta  . retrograde central aortic catheterization  05/19/2005   cutting balloon  atherectomy, c-circ stenosis with DES STENTING CYPHER  . TONSILLECTOMY    . TRANSURETHRAL RESECTION OF PROSTATE N/A 09/08/2016   Procedure: TRANSURETHRAL RESECTION OF THE PROSTATE (TURP);  Surgeon: Ardis Hughs, MD;  Location: [facility];  Service: Urology;  Laterality: N/A;  . TRANSURETHRAL RESECTION OF PROSTATE     10-10-17  Dr. Louis Meckel  . TRANSURETHRAL RESECTION OF PROSTATE N/A 10/10/2017   Procedure: TRANSURETHRAL RESECTION OF THE PROSTATE (TURP);  Surgeon: Ardis Hughs, MD;  Location: [facility];  Service: Urology;  Laterality: N/A;  . VASECTOMY      Social History:  reports that he quit smoking about 50 years ago. His smoking use included cigarettes. He has a 40.00 pack-year smoking history. He has never used smokeless tobacco. He reports current alcohol use of about 4.0 standard drinks of alcohol per week. He reports that he does not use drugs.  Allergies:  Allergies  Allergen Reactions  . Lisinopril Cough  . Niaspan [Niacin Er] Rash    Medications: No current facility-administered medications for this encounter.    Current Outpatient Medications  Medication Sig Dispense Refill  . amLODipine (NORVASC) 5 MG tablet Take 5 mg by mouth daily.      [name] aspirin EC 81 MG tablet Take 81 mg by mouth daily.    [name] atenolol (TENORMIN) 50 MG tablet Take 50 mg by mouth daily.      [name] atorvastatin (LIPITOR) 40 MG tablet Take 40 mg by mouth daily at 6 PM.     . benzonatate (TESSALON) 100 MG capsule Take 100 mg by mouth 3 (three) times daily as needed for cough.   0  . Carboxymethylcellul-Glycerin (LUBRICATING EYE DROPS OP) Place 1 drop into both eyes 3 (three) times daily as needed (dry eyes).     . cholecalciferol (DELTA D3) 400 units TABS tablet Take 400 Units by mouth daily.    . clopidogrel (PLAVIX) 75 MG tablet Take 75 mg by mouth every evening.     . diazepam (VALIUM) 5 MG tablet Take 5 mg by mouth every 8 (eight) hours as needed for anxiety.    [name] EVENING PRIMROSE OIL PO Take 1 capsule by  mouth daily.    . hydrochlorothiazide 25 MG tablet Take 25 mg by mouth daily.      [name] losartan (COZAAR) 50 MG tablet Take 50 mg by mouth every evening.     . magnesium oxide (MAG-OX) 400 MG tablet Take 400 mg by mouth daily.     . Misc Natural Products (GLUCOSAMINE CHONDROITIN TRIPLE) TABS Take 1 tablet by mouth 2 (two) times daily.     . Multiple Vitamin (MULTIVITAMIN WITH MINERALS) TABS tablet Take 1 tablet by mouth daily.    . pantoprazole (PROTONIX) 40 MG tablet TAKE 1 TABLET BY MOUTH TWICE A DAY 180 tablet 0  . potassium chloride SA (K-DUR,KLOR-CON) 20 MEQ tablet Take 20 mEq by mouth every evening.     . tamsulosin (FLOMAX) 0.4 MG CAPS capsule tamsulosin 0.4  mg capsule  TAKE ONE CAPSULE BY MOUTH EVERY EVENING    . vitamin C (ASCORBIC ACID) 500 MG tablet Take 500 mg by mouth daily.      No results found for this or any previous visit (from the past 48 hour(s)). No results found.  ROS: Pain with rom of the left lower extremity  Physical Exam: Alert and oriented 85 y.o. male in no acute distress Cranial nerves 2-12 intact Cervical spine: full rom with no tenderness, nv intact distally Chest: active breath sounds bilaterally, no wheeze rhonchi or rales Heart: regular rate and rhythm, no murmur Abd: non tender non distended with active bowel sounds Hip is stable with rom  Left knee painful rom nv intact distally No rashes or edema Antalgic gait  Assessment/Plan Assessment: left knee end stage osteoarthritis  Plan:  Patient will undergo a left total knee by Dr. Veverly Fells at [facility]. Risks benefits and expectations were discussed with the patient. Patient understand risks, benefits and expectations and wishes to proceed. Preoperative templating of the joint replacement has been completed, documented, and submitted to the Operating Room personnel in order to optimize intra-operative equipment management.   Merla Riches PA-C, MPAS [facility] is now [employer] [address], [place], [place], [place] [address] Phone: [phone] [email or web address] Facebook  [employer]

## 2018-12-26 ENCOUNTER — Other Ambulatory Visit (HOSPITAL_COMMUNITY): Payer: Self-pay | Admitting: Cardiovascular Disease

## 2018-12-26 ENCOUNTER — Ambulatory Visit (HOSPITAL_COMMUNITY)
Admission: RE | Admit: 2018-12-26 | Discharge: 2018-12-26 | Disposition: A | Discharge status: Home / Self Care | Payer: Medicare Other | Source: Ambulatory Visit | Attending: Cardiovascular Disease | Admitting: Cardiovascular Disease

## 2018-12-26 ENCOUNTER — Other Ambulatory Visit: Payer: Self-pay

## 2018-12-26 DIAGNOSIS — I6523 Occlusion and stenosis of bilateral carotid arteries: Secondary | ICD-10-CM

## 2018-12-26 NOTE — Patient Instructions (Addendum)
Cirilo Canner [facility]    Your procedure is scheduled on: Friday 6/5.20    Report to [facility]  Entrance  Report to admitting at  7:15 AM   YOU NEED TO HAVE A COVID 19 TEST ON_  Tuesday 6/2/______,  THIS TEST MUST BE DONE BEFORE SURGERY, COME TO [facility] ENTRANCE BETWEEN THE HOURS OF 900 AM AND 300 PM ON YOUR COVID TEST DATE.   Call this number if you have problems the morning of surgery [place] AND RINSE YOUR MOUTH OUT, NO CHEWING GUM CANDY OR MINTS .  Do not eat food After Midnight.  YOU MAY HAVE CLEAR LIQUIDS FROM MIDNIGHT UNTIL 4:30AM.  At 4:30AM Please finish the prescribed Pre-Surgery Gatorade drink.  Nothing by mouth after you finish the Gatorade drink !   Take these medicines the morning of surgery with A SIP OF WATER: atenolol(Tenormin),   amlodipine(Norvasc),Cilostazol,apoaequorin(prevagen)                                 You may not have any metal on your body including hair pins and              piercings             Do not wear jewelry, lotions, powders or , deodorant                           Men may shave face and neck.   - Preparing for Surgery  Before surgery, you can play an important role.  Because skin is not sterile, your skin needs to be as free of germs as possible.  You can reduce the number of germs on your skin by washing with CHG (chlorahexidine gluconate) soap before surgery.  CHG is an antiseptic cleaner which kills germs and bonds with the skin to continue killing germs even after washing. Please DO NOT use if you have an allergy to CHG or antibacterial soaps.  If your skin becomes reddened/irritated stop using the CHG and inform your nurse when you arrive at Short Stay.  Do not shave (including legs and underarms) for at least 48 hours prior to the first CHG shower.  You may shave your face/neck . Please follow these instructions  carefully:   1.  Shower with CHG Soap the night before surgery and the  morning of Surgery.  2.  If you choose to wash your hair, wash your hair first as usual with your  normal  shampoo.  3.  After you shampoo, rinse your hair and body thoroughly to remove the  shampoo.                                        4.  Use CHG as you would any other liquid soap.  You can apply chg directly  to the skin and wash                       Gently with a scrungie or clean washcloth.  5.  Apply the CHG Soap to your body ONLY FROM THE  NECK DOWN.   Do not use on face/ open                           Wound or open sores. Avoid contact with eyes, ears mouth and genitals (private parts).                       Wash face,  Genitals (private parts) with your normal soap.             6.  Wash thoroughly, paying special attention to the area where your surgery  will be performed.  7.  Thoroughly rinse your body with warm water from the neck down.  8.  DO NOT shower/wash with your normal soap after using and rinsing off  the CHG Soap.              9.  Pat yourself dry with a clean towel.            10.  Wear clean pajamas.            11.  Place clean sheets on your bed the night of your first shower and do not  sleep with pets.  Day of Surgery : Do not apply any lotions/deodorants the morning of surgery.  Please wear clean clothes to the hospital/surgery center .  FAILURE TO FOLLOW THESE INSTRUCTIONS MAY RESULT IN THE CANCELLATION OF YOUR SURGERY PATIENT SIGNATURE_________________________________  NURSE SIGNATURE__________________________________  ________________________________________________________________________   Do not bring valuables to the hospital. [facility] IS NOT             RESPONSIBLE   FOR VALUABLES.  Contacts, dentures or bridgework may not be worn into surgery.     Name and phone number of your driver:  Special Instructions: N/A                Incentive Spirometer  An incentive  spirometer is a tool that can help keep your lungs clear and active. This tool measures how well you are filling your lungs with each breath. Taking long deep breaths may help reverse or decrease the chance of developing breathing (pulmonary) problems (especially infection) following:  A long period of time when you are unable to move or be active. BEFORE THE PROCEDURE   If the spirometer includes an indicator to show your best effort, your nurse or respiratory therapist will set it to a desired goal.  If possible, sit up straight or lean slightly forward. Try not to slouch.  Hold the incentive spirometer in an upright position. INSTRUCTIONS FOR USE  1. Sit on the edge of your bed if possible, or sit up as far as you can in bed or on a chair. 2. Hold the incentive spirometer in an upright position. 3. Breathe out normally. 4. Place the mouthpiece in your mouth and seal your lips tightly around it. 5. Breathe in slowly and as deeply as possible, raising the piston or the ball toward the top of the column. 6. Hold your breath for 3-5 seconds or for as long as possible. Allow the piston or ball to fall to the bottom of the column. 7. Remove the mouthpiece from your mouth and breathe out normally. 8. Rest for a few seconds and repeat Steps 1 through 7 at least 10 times every 1-2 hours when you are awake. Take your time and take a few normal breaths between deep breaths. 9. The spirometer may include  an indicator to show your best effort. Use the indicator as a goal to work toward during each repetition. 10. After each set of 10 deep breaths, practice coughing to be sure your lungs are clear. If you have an incision (the cut made at the time of surgery), support your incision when coughing by placing a pillow or rolled up towels firmly against it. Once you are able to get out of bed, walk around indoors and cough well. You may stop using the incentive spirometer when instructed by your caregiver.   RISKS AND COMPLICATIONS  Take your time so you do not get dizzy or light-headed.  If you are in pain, you may need to take or ask for pain medication before doing incentive spirometry. It is harder to take a deep breath if you are having pain. AFTER USE  Rest and breathe slowly and easily.  It can be helpful to keep track of a log of your progress. Your caregiver can provide you with a simple table to help with this. If you are using the spirometer at home, follow these instructions: [place] IF:   You are having difficultly using the spirometer.  You have trouble using the spirometer as often as instructed.  Your pain medication is not giving enough relief while using the spirometer.  You develop fever of 100.5 F (38.1 C) or higher. SEEK IMMEDIATE MEDICAL CARE IF:   You cough up bloody sputum that had not been present before.  You develop fever of 102 F (38.9 C) or greater.  You develop worsening pain at or near the incision site. MAKE SURE YOU:   Understand these instructions.  Will watch your condition.  Will get help right away if you are not doing well or get worse. Document Released: 11/27/2006 Document Revised: 10/09/2011 Document Reviewed: 01/28/2007 [facility] Patient Information 2014 [place], [place].   ________________________________________________________________________

## 2018-12-27 ENCOUNTER — Other Ambulatory Visit: Payer: Self-pay

## 2018-12-27 ENCOUNTER — Encounter (HOSPITAL_COMMUNITY): Payer: Self-pay

## 2018-12-27 ENCOUNTER — Encounter (HOSPITAL_COMMUNITY)
Admission: RE | Admit: 2018-12-27 | Discharge: 2018-12-27 | Disposition: A | Discharge status: Home / Self Care | Payer: Medicare Other | Source: Ambulatory Visit | Attending: Orthopedic Surgery | Admitting: Orthopedic Surgery

## 2018-12-27 ENCOUNTER — Telehealth: Payer: Self-pay | Admitting: Cardiovascular Disease

## 2018-12-27 DIAGNOSIS — Z01812 Encounter for preprocedural laboratory examination: Secondary | ICD-10-CM | POA: Insufficient documentation

## 2018-12-27 DIAGNOSIS — M1712 Unilateral primary osteoarthritis, left knee: Secondary | ICD-10-CM | POA: Diagnosis not present

## 2018-12-27 LAB — BASIC METABOLIC PANEL
Anion gap: 6 (ref 5–15)
BUN: 19 mg/dL (ref 8–23)
CO2: 27 mmol/L (ref 22–32)
Calcium: 9.5 mg/dL (ref 8.9–10.3)
Chloride: 109 mmol/L (ref 98–111)
Creatinine, Ser: 0.71 mg/dL (ref 0.61–1.24)
GFR calc Af Amer: >60 mL/min (ref >60)
GFR calc non Af Amer: >60 mL/min (ref >60)
Glucose, Bld: 110 mg/dL — ABNORMAL HIGH (ref 70–99)
Potassium: 4.5 mmol/L (ref 3.5–5.1)
Sodium: 142 mmol/L (ref 135–145)

## 2018-12-27 LAB — SURGICAL PCR SCREEN
MRSA, PCR: NEGATIVE
Staphylococcus aureus: NEGATIVE

## 2018-12-27 LAB — CBC
HCT: 37.7 % — ABNORMAL LOW (ref 39.0–52.0)
Hemoglobin: 11.7 g/dL — ABNORMAL LOW (ref 13.0–17.0)
MCH: 26.5 pg (ref 26.0–34.0)
MCHC: 31 g/dL (ref 30.0–36.0)
MCV: 85.3 fL (ref 80.0–100.0)
Platelets: 228 10*3/uL (ref 150–400)
RBC: 4.42 MIL/uL (ref 4.22–5.81)
RDW: 16.7 % — ABNORMAL HIGH (ref 11.5–15.5)
WBC: 6.6 10*3/uL (ref 4.0–10.5)
nRBC: 0 % (ref 0.0–0.2)

## 2018-12-27 NOTE — Telephone Encounter (Signed)
New Message           [facility] Pre-operative Risk Assessment    Request for surgical clearance:  1. What type of surgery is being performed? Total knee replacement  2. When is this surgery scheduled? 01/03/19  3. What type of clearance is required (medical clearance vs. Pharmacy clearance to hold med vs. Both)? Pharmacy  4. Are there any medications that need to be held prior to surgery and how long Plavix/Asprin  5. Practice name and name of physician performing surgery? [facility]  6. What is your office phone number [phone]    7.   What is your office fax number [phone]  8.   Anesthesia type (None, local, MAC, general) ? General   Nathan Santiago 12/27/2018, 9:44 AM  _________________________________________________________________   (provider comments below)

## 2018-12-27 NOTE — Telephone Encounter (Addendum)
   Primary Cardiologist: [name], MD  Chart reviewed as part of pre-operative protocol coverage. Given past medical history and time since last visit, based on ACC/AHA guidelines, Nathan Santiago would be at acceptable risk for the planned procedure without further cardiovascular testing.  He is able to get > 4 mets of activity.   Per Prior recommendations, "Okay to hold antiplatelet agents  ( 5 days prior) for his orthopedic procedure".   I will route this recommendation to the requesting party via Epic fax function and remove from pre-op pool.  Please call with questions.  [place], [place] 12/27/2018, 1:33 PM

## 2018-12-27 NOTE — Progress Notes (Signed)
Nathan Felix PA    PCP: Dr [name]  CARDIOLOGIST: Dr. Adora Fridge  INFO IN Epic:EKG, CXR, Labs  INFO ON CHART:  BLOOD THINNERS AND LAST DOSES:  Pt takes plavix and ASA. Pt's DOS 6/5. No one had spoken with him about stopping meds. I called Dr. Blanch Media office and she said we would get the info before surgery. I told Pt to Stop today and call Dr. Gilberto Better office to confirm. ____________________________________  PATIENT SYMPTOMS AT TIME OF PREOP:

## 2018-12-31 ENCOUNTER — Other Ambulatory Visit: Payer: Self-pay

## 2018-12-31 ENCOUNTER — Other Ambulatory Visit (HOSPITAL_COMMUNITY)
Admission: RE | Admit: 2018-12-31 | Discharge: 2018-12-31 | Disposition: A | Discharge status: Home / Self Care | Payer: Medicare Other | Source: Ambulatory Visit | Attending: Orthopedic Surgery | Admitting: Orthopedic Surgery

## 2018-12-31 DIAGNOSIS — Z1159 Encounter for screening for other viral diseases: Secondary | ICD-10-CM | POA: Insufficient documentation

## 2018-12-31 NOTE — Progress Notes (Signed)
Anesthesia Chart Review   Case:  [id number] Date/Time:  01/03/19 0940   Procedure:  TOTAL KNEE ARTHROPLASTY (Left ) - with IS block   Anesthesia type:  General   Pre-op diagnosis:  Left knee end stage osteoarthritis   Location:  [facility] ROOM 06 / [facility]   Surgeon:  Netta Cedars, MD      DISCUSSION: 85 yo former smoker (40 pack years, quit 07/31/68) with h/o HTN, GERD, PAD (s/p Dalm aortic stenting 04/24/2016), PUD, HLD, CAD, left knee end stage OA scheduled for above procedure 01/03/2019 with Dr. Netta Cedars.   Pt cleared by cardiology 12/27/18.  Per Leanor Kail, PA-C, "Given past medical history and time since last visit, based on ACC/AHA guidelines, ENDER RORKE would be at acceptable risk for the planned procedure without further cardiovascular testing.  He is able to get > 4 mets of activity. Per Prior recommendations, "Okay to hold antiplatelet agents  ( 5 days prior) for his orthopedic procedure"."  Vascular ultrasound 08/02/2018 with no evidence of AAA.  Pt can proceed with planned procedure barring acute status change.  VS: BP (!) 156/54   Pulse 67   Temp 36.6 C (Oral)   Resp 16   Ht 5\' 4"  (1.626 m)   Wt 76.7 kg   SpO2 99%   BMI 29.04 kg/m   PROVIDERS: [name], MD is PCP   [name], MD is Cardiologist  LABS: Labs reviewed: Acceptable for surgery. (all labs ordered are listed, but only abnormal results are displayed)  Labs Reviewed  CBC - Abnormal; Notable for the following components:      Result Value   Hemoglobin 11.7 (*)    HCT 37.7 (*)    RDW 16.7 (*)    All other components within normal limits  BASIC METABOLIC PANEL - Abnormal; Notable for the following components:   Glucose, Bld 110 (*)    All other components within normal limits  SURGICAL PCR SCREEN     IMAGES:   EKG: 07/16/18 Rate 72 bpm Normal sinus rhythm  Nonspecific T wave abnormality  CV: Echo 02/07/2012 Interpretation:  A complete two-dimensional transthoracic  echocardiogram was performed.  The left ventricle is normal in size There is mild concentric left ventricular hypertrophy Left ventricular systolic function is normal There is mild mitral annular calcification There is mild tricuspid regurgitation The aortic valve appears to be mildly sclerotic Trace aortic regurgitation Trace pulmonic valvular regurgitation  The transmittal spectral Doppler flow pattern is suggestive of impaired LV relaxation  Tissue doppler suggests increased LA pressure Past Medical History:  Diagnosis Date  . AAA (abdominal aortic aneurysm) (HCC)    asymptomatic  . AAA (abdominal aortic aneurysm) ([place]) 2010   peripheral  angiogram-- bilateral SFA DISEASE  and 60 to 70% infrarenaal abd. aortic stenosis with 15 -mm gradient  . Adenomatous colon polyp 11/2003  . CAD (coronary artery disease)   . Gait abnormality 02/13/2017  . Gastroparesis    pt denies  . GERD (gastroesophageal reflux disease)    w/ LPR  . History of kidney stones    x2  . Hyperlipidemia   . Hypertension   . Peripheral arterial disease ([place])    RCEA  by Dr Lemar Livings  . PUD (peptic ulcer disease)    pt unaware  . Vertigo     Past Surgical History:  Procedure Laterality Date  . ABDOMINAL AORTOGRAM W/LOWER EXTREMITY Bilateral 08/05/2018   Procedure: ABDOMINAL AORTOGRAM W/LOWER EXTREMITY;  Surgeon: Lorretta Harp, MD;  Location: [facility];  Service: Cardiovascular;  Laterality: Bilateral;  . AORTOGRAM  04/24/2016    Abdominal aortogram, bilateral iliac angiogram, bifemoral runoff  . CARDIAC CATHETERIZATION  05/12/2005   RCA  . carotid doppler  01/24/2013   RICA endarterectomy,left CCA 0-49%; left bulb and prox ICA 50-69%; bilaateral subclavian < 50%  . CAROTID ENDARTERECTOMY  11/01/2010  . CATARACT EXTRACTION    . COLONOSCOPY    . CORONARY ANGIOPLASTY  05/18/2005   2 STENTS distal RCA AND PROXIMAL-MID RCA   5 total stents per pt.  . CYSTOSCOPY/URETEROSCOPY/HOLMIUM LASER/STENT  PLACEMENT Left 01/11/2018   Procedure: LEFT URETEROSCOPY/HOLMIUM LASER/STENT PLACEMENT;  Surgeon: Ardis Hughs, MD;  Location: [facility];  Service: Urology;  Laterality: Left;  . DOPPLER ECHOCARDIOGRAPHY  02/07/2012   EF 55%,SHOWED NO ISCHEMIA   . HERNIA REPAIR     umbilical  . lower arterial  doppler  02/04/2013   aotra 1.5 x 1.5 cm; distal abd aorta 70-99%,proximal common iliac arteries -very stenotic with increased velocities>50%,may be falsely elevated as a result of residual plaque from the distal aorta stenosis  . lower extremity doppler  June 18 ,2013   ABI'S ABNORMAL, RABI was 0.88 and LABI 0.75 ,with 3-vessel  run off  . NM MYOVIEW LTD  MAY [id number]   showed no significant ischemia;  . NM MYOVIEW LTD  04/22/2008   ef 77%,exercise capcity 6 METS ,exaggerated blood pressure response to exercise  . PERIPHERAL VASCULAR CATHETERIZATION N/A 04/24/2016   Procedure: Lower Extremity Angiography;  Surgeon: Lorretta Harp, MD;  Location: [facility];  Service: Cardiovascular;  Laterality: N/A;  . PERIPHERAL VASCULAR CATHETERIZATION  04/24/2016   Procedure: Peripheral Vascular Intervention;  Surgeon: Lorretta Harp, MD;  Location: [facility];  Service: Cardiovascular;;  Aorta  . retrograde central aortic catheterization  05/19/2005   cutting balloon atherectomy, c-circ stenosis with DES STENTING CYPHER  . TONSILLECTOMY    . TRANSURETHRAL RESECTION OF PROSTATE N/A 09/08/2016   Procedure: TRANSURETHRAL RESECTION OF THE PROSTATE (TURP);  Surgeon: Ardis Hughs, MD;  Location: [facility];  Service: Urology;  Laterality: N/A;  . TRANSURETHRAL RESECTION OF PROSTATE     10-10-17  Dr. Louis Meckel  . TRANSURETHRAL RESECTION OF PROSTATE N/A 10/10/2017   Procedure: TRANSURETHRAL RESECTION OF THE PROSTATE (TURP);  Surgeon: Ardis Hughs, MD;  Location: [facility];  Service: Urology;  Laterality: N/A;  . VASECTOMY      MEDICATIONS: . amLODipine (NORVASC) 5 MG tablet  . Apoaequorin  (PREVAGEN PO)  . aspirin 325 MG tablet  . aspirin EC 81 MG tablet  . atenolol (TENORMIN) 50 MG tablet  . atorvastatin (LIPITOR) 40 MG tablet  . benzonatate (TESSALON) 100 MG capsule  . Biotin 10 MG CAPS  . bismuth subsalicylate (PEPTO BISMOL) 262 MG/15ML suspension  . cholecalciferol (VITAMIN D3) 25 MCG (1000 UT) tablet  . cilostazol (PLETAL) 100 MG tablet  . clopidogrel (PLAVIX) 75 MG tablet  . diazepam (VALIUM) 5 MG tablet  . diphenhydrAMINE (BENADRYL) 25 MG tablet  . hydrochlorothiazide 25 MG tablet  . losartan (COZAAR) 50 MG tablet  . Magnesium 250 MG TABS  . Misc Natural Products (GLUCOSAMINE CHONDROITIN TRIPLE) TABS  . Multiple Vitamin (MULTIVITAMIN WITH MINERALS) TABS tablet  . OVER THE COUNTER MEDICATION  . pantoprazole (PROTONIX) 40 MG tablet  . potassium chloride SA (K-DUR,KLOR-CON) 20 MEQ tablet  . vitamin C (ASCORBIC ACID) 500 MG tablet   No current facility-administered medications for this encounter.  Maia Plan [facility] Testing [phone] 12/31/18 11:40 AM

## 2018-12-31 NOTE — Anesthesia Preprocedure Evaluation (Addendum)
Anesthesia Evaluation  Patient identified by MRN, date of birth, ID band Patient awake    Reviewed: Allergy & Precautions, NPO status , Patient's Chart, lab work & pertinent test results  Airway Mallampati: II       Dental  (+) Poor Dentition   Pulmonary former smoker,    Pulmonary exam normal breath sounds clear to auscultation       Cardiovascular hypertension, Pt. on medications and Pt. on home beta blockers + CAD and + Peripheral Vascular Disease  Normal cardiovascular exam Rhythm:Regular Rate:Normal     Neuro/Psych    GI/Hepatic GERD  ,  Endo/Other    Renal/GU      Musculoskeletal   Abdominal Normal abdominal exam  (+)   Peds  Hematology  (+) anemia ,   Anesthesia Other Findings Progress Notes by Konrad Felix, PA-C at 12/27/2018 9:30 AM   Author: Konrad Felix, PA-C Service: Anesthesiology Author Type: Physician Assistant Certified Filed: 0/03/8118 11:40 AM Date of Service: 12/27/2018 9:30 AM Status: Signed Editor: Maia Plan (Physician Assistant Certified)     Show:Clear all ManualTemplateCopied  Added by: Konrad Felix, PA-C  Hover for details Anesthesia Chart Review    Case:  [id number] Date/Time:  01/03/19 0940  Procedure:  TOTAL KNEE ARTHROPLASTY (Left ) - with IS block  Anesthesia type:  General  Pre-op diagnosis:  Left knee end stage osteoarthritis  Location:  [place] [facility] / [facility]  Surgeon:  Netta Cedars, MD    DISCUSSION: 85 yo former smoker (40 pack years, quit 07/31/68) with h/o HTN, GERD, PAD (s/p Dalm aortic stenting 04/24/2016), PUD, HLD, CAD, left knee end stage OA scheduled for above procedure 01/03/2019 with Dr. Netta Cedars.   Pt cleared by cardiology 12/27/18.  Per Leanor Kail, PA-C, "Given past medical history and time since last visit, based on ACC/AHA guidelines,Nathan M Santiago be at acceptable risk for the planned procedure without  further cardiovascular testing. He is able to get > 4 mets of activity.Per Prior recommendations, "Okay to hold antiplatelet agents ( 5 days prior)for his orthopedic procedure"."  Vascular ultrasound 08/02/2018 with no evidence of AAA.  Pt can proceed with planned procedure barring acute status change.  VS: BP (!) 156/54   Pulse 67   Temp 36.6 C (Oral)   Resp 16   Ht 5\' 4"  (1.626 m)   Wt 76.7 kg   SpO2 99%   BMI 29.04 kg/m   PROVIDERS: [name], MD is PCP   [name], MD is Cardiologist  LABS: Labs reviewed: Acceptable for surgery. (all labs ordered are listed, but only abnormal results are displayed)   Labs Reviewed CBC - Abnormal; Notable for the following components:     Result Value   Hemoglobin 11.7 (*)   HCT 37.7 (*)   RDW 16.7 (*)   All other components within normal limits BASIC METABOLIC PANEL - Abnormal; Notable for the following components:  Glucose, Bld 110 (*)   All other components within normal limits SURGICAL PCR SCREEN    IMAGES:   EKG: 07/16/18 Rate 72 bpm Normal sinus rhythm  Nonspecific T wave abnormality  CV: Echo 02/07/2012 Interpretation:  A complete two-dimensional transthoracic echocardiogram was performed.  The left ventricle is normal in size There is mild concentric left ventricular hypertrophy Left ventricular systolic function is normal There is mild mitral annular calcification There is mild tricuspid regurgitation The aortic valve appears to be mildly sclerotic Trace aortic regurgitation Trace pulmonic valvular regurgitation  The transmittal spectral Doppler  flow pattern is suggestive of impaired LV relaxation  Tissue doppler suggests increased LA pressure  Past Medical History: Diagnosis Date . AAA (abdominal aortic aneurysm) (HCC)   asymptomatic . AAA (abdominal aortic aneurysm) ([place]) 2010  peripheral  angiogram-- bilateral SFA DISEASE  and 60 to 70% infrarenaal abd. aortic  stenosis with 15 -mm gradient . Adenomatous colon polyp 11/2003 . CAD (coronary artery disease)  . Gait abnormality 02/13/2017 . Gastroparesis   pt denies . GERD (gastroesophageal reflux disease)   w/ LPR . History of kidney stones   x2 . Hyperlipidemia  . Hypertension  . Peripheral arterial disease ([place])   RCEA  by Dr Lemar Livings . PUD (peptic ulcer disease)   pt unaware . Vertigo     Past Surgical History: Procedure Laterality Date . ABDOMINAL AORTOGRAM W/LOWER EXTREMITY Bilateral 08/05/2018  Procedure: ABDOMINAL AORTOGRAM W/LOWER EXTREMITY;  Surgeon: Lorretta Harp, MD;  Location: [facility];  Service: Cardiovascular;  Laterality: Bilateral; . AORTOGRAM  04/24/2016   Abdominal aortogram, bilateral iliac angiogram, bifemoral runoff . CARDIAC CATHETERIZATION  05/12/2005  RCA . carotid doppler  01/24/2013  RICA endarterectomy,left CCA 0-49%; left bulb and prox ICA 50-69%; bilaateral subclavian < 50% . CAROTID ENDARTERECTOMY  11/01/2010 . CATARACT EXTRACTION   . COLONOSCOPY   . CORONARY ANGIOPLASTY  05/18/2005  2 STENTS distal RCA AND PROXIMAL-MID RCA   5 total stents per pt. . CYSTOSCOPY/URETEROSCOPY/HOLMIUM LASER/STENT PLACEMENT Left 01/11/2018  Procedure: LEFT URETEROSCOPY/HOLMIUM LASER/STENT PLACEMENT;  Surgeon: Ardis Hughs, MD;  [facility];  Service: Urology;  Laterality: Left; . DOPPLER ECHOCARDIOGRAPHY  02/07/2012  EF 55%,SHOWED NO ISCHEMIA  . HERNIA REPAIR    umbilical . lower arterial  doppler  02/04/2013  aotra 1.5 x 1.5 cm; distal abd aorta 70-99%,proximal common iliac arteries -very stenotic with increased velocities>50%,may be falsely elevated as a result of residual plaque from the distal aorta stenosis . lower extremity doppler  June 18 ,2013  ABI'S ABNORMAL, RABI was 0.88 and LABI 0.75 ,with 3-vessel  run off . NM MYOVIEW LTD  MAY [id number]  showed no significant ischemia; . NM MYOVIEW  LTD  04/22/2008  ef 77%,exercise capcity 6 METS ,exaggerated blood pressure response to exercise . PERIPHERAL VASCULAR CATHETERIZATION N/A 04/24/2016  Procedure: Lower Extremity Angiography;  Surgeon: Lorretta Harp, MD;  Location: [place] [facility];  Service: Cardiovascular;  Laterality: N/A; . PERIPHERAL VASCULAR CATHETERIZATION  04/24/2016  Procedure: Peripheral Vascular Intervention;  Surgeon: Lorretta Harp, MD;  Location: [facility];  Service: Cardiovascular;;  Aorta . retrograde central aortic catheterization  05/19/2005  cutting balloon atherectomy, c-circ stenosis with DES STENTING CYPHER . TONSILLECTOMY   . TRANSURETHRAL RESECTION OF PROSTATE N/A 09/08/2016  Procedure: TRANSURETHRAL RESECTION OF THE PROSTATE (TURP);  Surgeon: Ardis Hughs, MD;  [facility];  Service: Urology;  Laterality: N/A; . TRANSURETHRAL RESECTION OF PROSTATE    10-10-17  Dr. Louis Meckel . TRANSURETHRAL RESECTION OF PROSTATE N/A 10/10/2017  Procedure: TRANSURETHRAL RESECTION OF THE PROSTATE (TURP);  Surgeon: Ardis Hughs, MD;  [facility];  Service: Urology;  Laterality: N/A; . VASECTOMY     MEDICATIONS:  . amLODipine (NORVASC) 5 MG tablet . Apoaequorin (PREVAGEN PO) . aspirin 325 MG tablet . aspirin EC 81 MG tablet . atenolol (TENORMIN) 50 MG tablet . atorvastatin (LIPITOR) 40 MG tablet . benzonatate (TESSALON) 100 MG capsule . Biotin 10 MG CAPS . bismuth subsalicylate (PEPTO BISMOL) 262 MG/15ML suspension . cholecalciferol (VITAMIN D3) 25 MCG (1000 UT) tablet .  cilostazol (PLETAL) 100 MG tablet . clopidogrel (PLAVIX) 75 MG tablet . diazepam (VALIUM) 5 MG tablet . diphenhydrAMINE (BENADRYL) 25 MG tablet . hydrochlorothiazide 25 MG tablet . losartan (COZAAR) 50 MG tablet . Magnesium 250 MG TABS . Misc Natural Products (GLUCOSAMINE CHONDROITIN TRIPLE) TABS . Multiple Vitamin (MULTIVITAMIN WITH MINERALS) TABS tablet . OVER THE COUNTER  MEDICATION . pantoprazole (PROTONIX) 40 MG tablet . potassium chloride SA (K-DUR,KLOR-CON) 20 MEQ tablet . vitamin C (ASCORBIC ACID) 500 MG tablet   No current facility-administered medications for this encounter.    Maia Plan [facility] Pre-Surgical Testing [phone] 12/31/18 11:40 AM       Progress Notes by Konrad Felix, PA-C at 12/27/2018 9:30 AM   Author: Konrad Felix, PA-C Service: Anesthesiology Author Type: Physician Assistant Certified Filed: 0/03/8118 11:40 AM Date of Service: 12/27/2018 9:30 AM Status: Signed Editor: Maia Plan (Physician Assistant Certified)     Show:Clear all ManualTemplateCopied  Added by: Konrad Felix, PA-C  Hover for details Anesthesia Chart Review    Case:  [id number] Date/Time:  01/03/19 0940  Procedure:  TOTAL KNEE ARTHROPLASTY (Left ) - with IS block  Anesthesia type:  General  Pre-op diagnosis:  Left knee end stage osteoarthritis  Location:  [place] [facility] / [facility]  Surgeon:  Netta Cedars, MD    DISCUSSION: 85 yo former smoker (40 pack years, quit 07/31/68) with h/o HTN, GERD, PAD (s/p Dalm aortic stenting 04/24/2016), PUD, HLD, CAD, left knee end stage OA scheduled for above procedure 01/03/2019 with Dr. Netta Cedars.   Pt cleared by cardiology 12/27/18.  Per Leanor Kail, PA-C, "Given past medical history and time since last visit, based on ACC/AHA guidelines,Nathan Santiago be at acceptable risk for the planned procedure without further cardiovascular testing. He is able to get > 4 mets of activity.Per Prior recommendations, "Okay to hold antiplatelet agents ( 5 days prior)for his orthopedic procedure"."  Vascular ultrasound 08/02/2018 with no evidence of AAA.  Pt can proceed with planned procedure barring acute status change.  VS: BP (!) 156/54   Pulse 67   Temp 36.6 C (Oral)   Resp 16   Ht 5\' 4"  (1.626 m)   Wt 76.7 kg   SpO2 99%   BMI 29.04 kg/m    PROVIDERS: [name], MD is PCP   [name], MD is Cardiologist  LABS: Labs reviewed: Acceptable for surgery. (all labs ordered are listed, but only abnormal results are displayed)   Labs Reviewed CBC - Abnormal; Notable for the following components:     Result Value   Hemoglobin 11.7 (*)   HCT 37.7 (*)   RDW 16.7 (*)   All other components within normal limits BASIC METABOLIC PANEL - Abnormal; Notable for the following components:  Glucose, Bld 110 (*)   All other components within normal limits SURGICAL PCR SCREEN    IMAGES:   EKG: 07/16/18 Rate 72 bpm Normal sinus rhythm  Nonspecific T wave abnormality  CV: Echo 02/07/2012 Interpretation:  A complete two-dimensional transthoracic echocardiogram was performed.  The left ventricle is normal in size There is mild concentric left ventricular hypertrophy Left ventricular systolic function is normal There is mild mitral annular calcification There is mild tricuspid regurgitation The aortic valve appears to be mildly sclerotic Trace aortic regurgitation Trace pulmonic valvular regurgitation  The transmittal spectral Doppler flow pattern is suggestive of impaired LV relaxation  Tissue doppler suggests increased LA pressure  Past Medical History: Diagnosis Date . AAA (abdominal aortic aneurysm) ()  asymptomatic . AAA (abdominal aortic aneurysm) ([place]) 2010  peripheral  angiogram-- bilateral SFA DISEASE  and 60 to 70% infrarenaal abd. aortic stenosis with 15 -mm gradient . Adenomatous colon polyp 11/2003 . CAD (coronary artery disease)  . Gait abnormality 02/13/2017 . Gastroparesis   pt denies . GERD (gastroesophageal reflux disease)   w/ LPR . History of kidney stones   x2 . Hyperlipidemia  . Hypertension  . Peripheral arterial disease ([place])   RCEA  by Dr Lemar Livings . PUD (peptic ulcer disease)   pt unaware . Vertigo     Past Surgical  History: Procedure Laterality Date . ABDOMINAL AORTOGRAM W/LOWER EXTREMITY Bilateral 08/05/2018  Procedure: ABDOMINAL AORTOGRAM W/LOWER EXTREMITY;  Surgeon: Lorretta Harp, MD;  Location: [facility];  Service: Cardiovascular;  Laterality: Bilateral; . AORTOGRAM  04/24/2016   Abdominal aortogram, bilateral iliac angiogram, bifemoral runoff . CARDIAC CATHETERIZATION  05/12/2005  RCA . carotid doppler  01/24/2013  RICA endarterectomy,left CCA 0-49%; left bulb and prox ICA 50-69%; bilaateral subclavian < 50% . CAROTID ENDARTERECTOMY  11/01/2010 . CATARACT EXTRACTION   . COLONOSCOPY   . CORONARY ANGIOPLASTY  05/18/2005  2 STENTS distal RCA AND PROXIMAL-MID RCA   5 total stents per pt. . CYSTOSCOPY/URETEROSCOPY/HOLMIUM LASER/STENT PLACEMENT Left 01/11/2018  Procedure: LEFT URETEROSCOPY/HOLMIUM LASER/STENT PLACEMENT;  Surgeon: Ardis Hughs, MD;  [facility];  Service: Urology;  Laterality: Left; . DOPPLER ECHOCARDIOGRAPHY  02/07/2012  EF 55%,SHOWED NO ISCHEMIA  . HERNIA REPAIR    umbilical . lower arterial  doppler  02/04/2013  aotra 1.5 x 1.5 cm; distal abd aorta 70-99%,proximal common iliac arteries -very stenotic with increased velocities>50%,may be falsely elevated as a result of residual plaque from the distal aorta stenosis . lower extremity doppler  June 18 ,2013  ABI'S ABNORMAL, RABI was 0.88 and LABI 0.75 ,with 3-vessel  run off . NM MYOVIEW LTD  MAY [id number]  showed no significant ischemia; . NM MYOVIEW LTD  04/22/2008  ef 77%,exercise capcity 6 METS ,exaggerated blood pressure response to exercise . PERIPHERAL VASCULAR CATHETERIZATION N/A 04/24/2016  Procedure: Lower Extremity Angiography;  Surgeon: Lorretta Harp, MD;  Location: [facility];  Service: Cardiovascular;  Laterality: N/A; . PERIPHERAL VASCULAR CATHETERIZATION  04/24/2016  Procedure: Peripheral Vascular Intervention;  Surgeon: Lorretta Harp, MD;  Location: [facility];  Service: Cardiovascular;;  Aorta . retrograde central aortic catheterization  05/19/2005  cutting balloon atherectomy, c-circ stenosis with DES STENTING CYPHER . TONSILLECTOMY   . TRANSURETHRAL RESECTION OF PROSTATE N/A 09/08/2016  Procedure: TRANSURETHRAL RESECTION OF THE PROSTATE (TURP);  Surgeon: Ardis Hughs, MD;  [facility];  Service: Urology;  Laterality: N/A; . TRANSURETHRAL RESECTION OF PROSTATE    10-10-17  Dr. Louis Meckel . TRANSURETHRAL RESECTION OF PROSTATE N/A 10/10/2017  Procedure: TRANSURETHRAL RESECTION OF THE PROSTATE (TURP);  Surgeon: Ardis Hughs, MD;  [facility];  Service: Urology;  Laterality: N/A; . VASECTOMY     MEDICATIONS:  . amLODipine (NORVASC) 5 MG tablet . Apoaequorin (PREVAGEN PO) . aspirin 325 MG tablet . aspirin EC 81 MG tablet . atenolol (TENORMIN) 50 MG tablet . atorvastatin (LIPITOR) 40 MG tablet . benzonatate (TESSALON) 100 MG capsule . Biotin 10 MG CAPS . bismuth subsalicylate (PEPTO BISMOL) 262 MG/15ML suspension . cholecalciferol (VITAMIN D3) 25 MCG (1000 UT) tablet . cilostazol (PLETAL) 100 MG tablet . clopidogrel (PLAVIX) 75 MG tablet . diazepam (VALIUM) 5 MG tablet . diphenhydrAMINE (BENADRYL) 25 MG tablet . hydrochlorothiazide 25 MG tablet .  losartan (COZAAR) 50 MG tablet . Magnesium 250 MG TABS . Misc Natural Products (GLUCOSAMINE CHONDROITIN TRIPLE) TABS . Multiple Vitamin (MULTIVITAMIN WITH MINERALS) TABS tablet . OVER THE COUNTER MEDICATION . pantoprazole (PROTONIX) 40 MG tablet . potassium chloride SA (K-DUR,KLOR-CON) 20 MEQ tablet . vitamin C (ASCORBIC ACID) 500 MG tablet   No current facility-administered medications for this encounter.    Maia Plan [facility] Pre-Surgical Testing [phone] 12/31/18 11:40 AM       Progress Notes by Konrad Felix, PA-C at 12/27/2018 9:30 AM   Author: Konrad Felix, PA-C Service: Anesthesiology Author Type:  Physician Assistant Certified Filed: 0/03/8118 11:40 AM Date of Service: 12/27/2018 9:30 AM Status: Signed Editor: Maia Plan (Physician Assistant Certified)     Show:Clear all ManualTemplateCopied  Added by: Konrad Felix, PA-C  Hover for details Anesthesia Chart Review    Case:  [id number] Date/Time:  01/03/19 0940  Procedure:  TOTAL KNEE ARTHROPLASTY (Left ) - with IS block  Anesthesia type:  General  Pre-op diagnosis:  Left knee end stage osteoarthritis  Location:  [place] [facility] / [facility]  Surgeon:  Netta Cedars, MD    DISCUSSION: 85 yo former smoker (40 pack years, quit 07/31/68) with h/o HTN, GERD, PAD (s/p Dalm aortic stenting 04/24/2016), PUD, HLD, CAD, left knee end stage OA scheduled for above procedure 01/03/2019 with Dr. Netta Cedars.   Pt cleared by cardiology 12/27/18.  Per Leanor Kail, PA-C, "Given past medical history and time since last visit, based on ACC/AHA guidelines,Nathan Santiago be at acceptable risk for the planned procedure without further cardiovascular testing. He is able to get > 4 mets of activity.Per Prior recommendations, "Okay to hold antiplatelet agents ( 5 days prior)for his orthopedic procedure"."  Vascular ultrasound 08/02/2018 with no evidence of AAA.  Pt can proceed with planned procedure barring acute status change.  VS: BP (!) 156/54   Pulse 67   Temp 36.6 C (Oral)   Resp 16   Ht 5\' 4"  (1.626 m)   Wt 76.7 kg   SpO2 99%   BMI 29.04 kg/m   PROVIDERS: [name], MD is PCP   [name], MD is Cardiologist  LABS: Labs reviewed: Acceptable for surgery. (all labs ordered are listed, but only abnormal results are displayed)   Labs Reviewed CBC - Abnormal; Notable for the following components:     Result Value   Hemoglobin 11.7 (*)   HCT 37.7 (*)   RDW 16.7 (*)   All other components within normal limits BASIC METABOLIC PANEL - Abnormal; Notable for the following  components:  Glucose, Bld 110 (*)   All other components within normal limits SURGICAL PCR SCREEN    IMAGES:   EKG: 07/16/18 Rate 72 bpm Normal sinus rhythm  Nonspecific T wave abnormality  CV: Echo 02/07/2012 Interpretation:  A complete two-dimensional transthoracic echocardiogram was performed.  The left ventricle is normal in size There is mild concentric left ventricular hypertrophy Left ventricular systolic function is normal There is mild mitral annular calcification There is mild tricuspid regurgitation The aortic valve appears to be mildly sclerotic Trace aortic regurgitation Trace pulmonic valvular regurgitation  The transmittal spectral Doppler flow pattern is suggestive of impaired LV relaxation  Tissue doppler suggests increased LA pressure  Past Medical History: Diagnosis Date . AAA (abdominal aortic aneurysm) (HCC)   asymptomatic . AAA (abdominal aortic aneurysm) ([place]) 2010  peripheral  angiogram-- bilateral SFA DISEASE  and 60 to 70% infrarenaal abd. aortic stenosis with 15 -mm  gradient . Adenomatous colon polyp 11/2003 . CAD (coronary artery disease)  . Gait abnormality 02/13/2017 . Gastroparesis   pt denies . GERD (gastroesophageal reflux disease)   w/ LPR . History of kidney stones   x2 . Hyperlipidemia  . Hypertension  . Peripheral arterial disease ([place])   RCEA  by Dr Lemar Livings . PUD (peptic ulcer disease)   pt unaware . Vertigo     Past Surgical History: Procedure Laterality Date . ABDOMINAL AORTOGRAM W/LOWER EXTREMITY Bilateral 08/05/2018  Procedure: ABDOMINAL AORTOGRAM W/LOWER EXTREMITY;  Surgeon: Lorretta Harp, MD;  Location: [facility];  Service: Cardiovascular;  Laterality: Bilateral; . AORTOGRAM  04/24/2016   Abdominal aortogram, bilateral iliac angiogram, bifemoral runoff . CARDIAC CATHETERIZATION  05/12/2005  RCA . carotid doppler  01/24/2013  RICA endarterectomy,left CCA 0-49%;  left bulb and prox ICA 50-69%; bilaateral subclavian < 50% . CAROTID ENDARTERECTOMY  11/01/2010 . CATARACT EXTRACTION   . COLONOSCOPY   . CORONARY ANGIOPLASTY  05/18/2005  2 STENTS distal RCA AND PROXIMAL-MID RCA   5 total stents per pt. . CYSTOSCOPY/URETEROSCOPY/HOLMIUM LASER/STENT PLACEMENT Left 01/11/2018  Procedure: LEFT URETEROSCOPY/HOLMIUM LASER/STENT PLACEMENT;  Surgeon: Ardis Hughs, MD;  [facility];  Service: Urology;  Laterality: Left; . DOPPLER ECHOCARDIOGRAPHY  02/07/2012  EF 55%,SHOWED NO ISCHEMIA  . HERNIA REPAIR    umbilical . lower arterial  doppler  02/04/2013  aotra 1.5 x 1.5 cm; distal abd aorta 70-99%,proximal common iliac arteries -very stenotic with increased velocities>50%,may be falsely elevated as a result of residual plaque from the distal aorta stenosis . lower extremity doppler  June 18 ,2013  ABI'S ABNORMAL, RABI was 0.88 and LABI 0.75 ,with 3-vessel  run off . NM MYOVIEW LTD  MAY [id number]  showed no significant ischemia; . NM MYOVIEW LTD  04/22/2008  ef 77%,exercise capcity 6 METS ,exaggerated blood pressure response to exercise . PERIPHERAL VASCULAR CATHETERIZATION N/A 04/24/2016  Procedure: Lower Extremity Angiography;  Surgeon: Lorretta Harp, MD;  Location: [facility];  Service: Cardiovascular;  Laterality: N/A; . PERIPHERAL VASCULAR CATHETERIZATION  04/24/2016  Procedure: Peripheral Vascular Intervention;  Surgeon: Lorretta Harp, MD;  Location: [facility];  Service: Cardiovascular;;  Aorta . retrograde central aortic catheterization  05/19/2005  cutting balloon atherectomy, c-circ stenosis with DES STENTING CYPHER . TONSILLECTOMY   . TRANSURETHRAL RESECTION OF PROSTATE N/A 09/08/2016  Procedure: TRANSURETHRAL RESECTION OF THE PROSTATE (TURP);  Surgeon: Ardis Hughs, MD;  [facility];  Service: Urology;  Laterality: N/A; . TRANSURETHRAL RESECTION OF PROSTATE    10-10-17  Dr.  Louis Meckel . TRANSURETHRAL RESECTION OF PROSTATE N/A 10/10/2017  Procedure: TRANSURETHRAL RESECTION OF THE PROSTATE (TURP);  Surgeon: Ardis Hughs, MD;  [facility];  Service: Urology;  Laterality: N/A; . VASECTOMY     MEDICATIONS:  . amLODipine (NORVASC) 5 MG tablet . Apoaequorin (PREVAGEN PO) . aspirin 325 MG tablet . aspirin EC 81 MG tablet . atenolol (TENORMIN) 50 MG tablet . atorvastatin (LIPITOR) 40 MG tablet . benzonatate (TESSALON) 100 MG capsule . Biotin 10 MG CAPS . bismuth subsalicylate (PEPTO BISMOL) 262 MG/15ML suspension . cholecalciferol (VITAMIN D3) 25 MCG (1000 UT) tablet . cilostazol (PLETAL) 100 MG tablet . clopidogrel (PLAVIX) 75 MG tablet . diazepam (VALIUM) 5 MG tablet . diphenhydrAMINE (BENADRYL) 25 MG tablet . hydrochlorothiazide 25 MG tablet . losartan (COZAAR) 50 MG tablet . Magnesium 250 MG TABS . Misc Natural Products (GLUCOSAMINE CHONDROITIN TRIPLE) TABS . Multiple Vitamin (MULTIVITAMIN WITH MINERALS) TABS tablet .  OVER THE COUNTER MEDICATION . pantoprazole (PROTONIX) 40 MG tablet . potassium chloride SA (K-DUR,KLOR-CON) 20 MEQ tablet . vitamin C (ASCORBIC ACID) 500 MG tablet   No current facility-administered medications for this encounter.    Maia Plan [facility] Pre-Surgical Testing [phone] 12/31/18 11:40 AM       Progress Notes by Konrad Felix, PA-C at 12/27/2018 9:30 AM   Author: Konrad Felix, PA-C Service: Anesthesiology Author Type: Physician Assistant Certified Filed: 03/03/6961 11:40 AM Date of Service: 12/27/2018 9:30 AM Status: Signed Editor: Maia Plan (Physician Assistant Certified)     Show:Clear all ManualTemplateCopied  Added by: Konrad Felix, PA-C  Hover for details Anesthesia Chart Review    Case:  [id number] Date/Time:  01/03/19 0940  Procedure:  TOTAL KNEE ARTHROPLASTY (Left ) - with IS block  Anesthesia type:  General  Pre-op diagnosis:  Left knee  end stage osteoarthritis  Location:  [place] [facility] / [facility]  Surgeon:  Netta Cedars, MD    DISCUSSION: 85 yo former smoker (40 pack years, quit 07/31/68) with h/o HTN, GERD, PAD (s/p Dalm aortic stenting 04/24/2016), PUD, HLD, CAD, left knee end stage OA scheduled for above procedure 01/03/2019 with Dr. Netta Cedars.   Pt cleared by cardiology 12/27/18.  Per Leanor Kail, PA-C, "Given past medical history and time since last visit, based on ACC/AHA guidelines,Nathan Santiago be at acceptable risk for the planned procedure without further cardiovascular testing. He is able to get > 4 mets of activity.Per Prior recommendations, "Okay to hold antiplatelet agents ( 5 days prior)for his orthopedic procedure"."  Vascular ultrasound 08/02/2018 with no evidence of AAA.  Pt can proceed with planned procedure barring acute status change.  VS: BP (!) 156/54   Pulse 67   Temp 36.6 C (Oral)   Resp 16   Ht 5\' 4"  (1.626 m)   Wt 76.7 kg   SpO2 99%   BMI 29.04 kg/m   PROVIDERS: [name], MD is PCP   [name], MD is Cardiologist  LABS: Labs reviewed: Acceptable for surgery. (all labs ordered are listed, but only abnormal results are displayed)   Labs Reviewed CBC - Abnormal; Notable for the following components:     Result Value   Hemoglobin 11.7 (*)   HCT 37.7 (*)   RDW 16.7 (*)   All other components within normal limits BASIC METABOLIC PANEL - Abnormal; Notable for the following components:  Glucose, Bld 110 (*)   All other components within normal limits SURGICAL PCR SCREEN    IMAGES:   EKG: 07/16/18 Rate 72 bpm Normal sinus rhythm  Nonspecific T wave abnormality  CV: Echo 02/07/2012 Interpretation:  A complete two-dimensional transthoracic echocardiogram was performed.  The left ventricle is normal in size There is mild concentric left ventricular hypertrophy Left ventricular systolic function is normal There is mild  mitral annular calcification There is mild tricuspid regurgitation The aortic valve appears to be mildly sclerotic Trace aortic regurgitation Trace pulmonic valvular regurgitation  The transmittal spectral Doppler flow pattern is suggestive of impaired LV relaxation  Tissue doppler suggests increased LA pressure  Past Medical History: Diagnosis Date . AAA (abdominal aortic aneurysm) (HCC)   asymptomatic . AAA (abdominal aortic aneurysm) ([place]) 2010  peripheral  angiogram-- bilateral SFA DISEASE  and 60 to 70% infrarenaal abd. aortic stenosis with 15 -mm gradient . Adenomatous colon polyp 11/2003 . CAD (coronary artery disease)  . Gait abnormality 02/13/2017 . Gastroparesis   pt denies . GERD (gastroesophageal reflux disease)  w/ LPR . History of kidney stones   x2 . Hyperlipidemia  . Hypertension  . Peripheral arterial disease ([place])   RCEA  by Dr Lemar Livings . PUD (peptic ulcer disease)   pt unaware . Vertigo     Past Surgical History: Procedure Laterality Date . ABDOMINAL AORTOGRAM W/LOWER EXTREMITY Bilateral 08/05/2018  Procedure: ABDOMINAL AORTOGRAM W/LOWER EXTREMITY;  Surgeon: Lorretta Harp, MD;  Location: [facility];  Service: Cardiovascular;  Laterality: Bilateral; . AORTOGRAM  04/24/2016   Abdominal aortogram, bilateral iliac angiogram, bifemoral runoff . CARDIAC CATHETERIZATION  05/12/2005  RCA . carotid doppler  01/24/2013  RICA endarterectomy,left CCA 0-49%; left bulb and prox ICA 50-69%; bilaateral subclavian < 50% . CAROTID ENDARTERECTOMY  11/01/2010 . CATARACT EXTRACTION   . COLONOSCOPY   . CORONARY ANGIOPLASTY  05/18/2005  2 STENTS distal RCA AND PROXIMAL-MID RCA   5 total stents per pt. . CYSTOSCOPY/URETEROSCOPY/HOLMIUM LASER/STENT PLACEMENT Left 01/11/2018  Procedure: LEFT URETEROSCOPY/HOLMIUM LASER/STENT PLACEMENT;  Surgeon: Ardis Hughs, MD;  [facility];  Service: Urology;  Laterality:  Left; . DOPPLER ECHOCARDIOGRAPHY  02/07/2012  EF 55%,SHOWED NO ISCHEMIA  . HERNIA REPAIR    umbilical . lower arterial  doppler  02/04/2013  aotra 1.5 x 1.5 cm; distal abd aorta 70-99%,proximal common iliac arteries -very stenotic with increased velocities>50%,may be falsely elevated as a result of residual plaque from the distal aorta stenosis . lower extremity doppler  June 18 ,2013  ABI'S ABNORMAL, RABI was 0.88 and LABI 0.75 ,with 3-vessel  run off . NM MYOVIEW LTD  MAY [id number]  showed no significant ischemia; . NM MYOVIEW LTD  04/22/2008  ef 77%,exercise capcity 6 METS ,exaggerated blood pressure response to exercise . PERIPHERAL VASCULAR CATHETERIZATION N/A 04/24/2016  Procedure: Lower Extremity Angiography;  Surgeon: Lorretta Harp, MD;  Location: [facility];  Service: Cardiovascular;  Laterality: N/A; . PERIPHERAL VASCULAR CATHETERIZATION  04/24/2016  Procedure: Peripheral Vascular Intervention;  Surgeon: Lorretta Harp, MD;  Location: [facility];  Service: Cardiovascular;;  Aorta . retrograde central aortic catheterization  05/19/2005  cutting balloon atherectomy, c-circ stenosis with DES STENTING CYPHER . TONSILLECTOMY   . TRANSURETHRAL RESECTION OF PROSTATE N/A 09/08/2016  Procedure: TRANSURETHRAL RESECTION OF THE PROSTATE (TURP);  Surgeon: Ardis Hughs, MD;  [facility];  Service: Urology;  Laterality: N/A; . TRANSURETHRAL RESECTION OF PROSTATE    10-10-17  Dr. Louis Meckel . TRANSURETHRAL RESECTION OF PROSTATE N/A 10/10/2017  Procedure: TRANSURETHRAL RESECTION OF THE PROSTATE (TURP);  Surgeon: Ardis Hughs, MD;  [facility];  Service: Urology;  Laterality: N/A; . VASECTOMY     MEDICATIONS:  . amLODipine (NORVASC) 5 MG tablet . Apoaequorin (PREVAGEN PO) . aspirin 325 MG tablet . aspirin EC 81 MG tablet . atenolol (TENORMIN) 50 MG tablet . atorvastatin (LIPITOR) 40 MG tablet . benzonatate (TESSALON) 100 MG  capsule . Biotin 10 MG CAPS . bismuth subsalicylate (PEPTO BISMOL) 262 MG/15ML suspension . cholecalciferol (VITAMIN D3) 25 MCG (1000 UT) tablet . cilostazol (PLETAL) 100 MG tablet . clopidogrel (PLAVIX) 75 MG tablet . diazepam (VALIUM) 5 MG tablet . diphenhydrAMINE (BENADRYL) 25 MG tablet . hydrochlorothiazide 25 MG tablet . losartan (COZAAR) 50 MG tablet . Magnesium 250 MG TABS . Misc Natural Products (GLUCOSAMINE CHONDROITIN TRIPLE) TABS . Multiple Vitamin (MULTIVITAMIN WITH MINERALS) TABS tablet . OVER THE COUNTER MEDICATION . pantoprazole (PROTONIX) 40 MG tablet . potassium chloride SA (K-DUR,KLOR-CON) 20 MEQ tablet . vitamin C (ASCORBIC ACID) 500 MG tablet  No current facility-administered medications for this encounter.    Maia Plan [facility] [phone] 12/31/18 11:40 AM       Progress Notes by Konrad Felix, PA-C at 12/27/2018 9:30 AM   Author: Konrad Felix, PA-C Service: Anesthesiology Author Type: Physician Assistant Certified Filed: 02/06/239 11:40 AM Date of Service: 12/27/2018 9:30 AM Status: Signed Editor: Maia Plan (Physician Assistant Certified)     Show:Clear all ManualTemplateCopied  Added by: Konrad Felix, PA-C  Hover for details Anesthesia Chart Review    Case:  [id number] Date/Time:  01/03/19 0940  Procedure:  TOTAL KNEE ARTHROPLASTY (Left ) - with IS block  Anesthesia type:  General  Pre-op diagnosis:  Left knee end stage osteoarthritis  Location:  [place] [facility] / [facility]  Surgeon:  Netta Cedars, MD    DISCUSSION: 85 yo former smoker (40 pack years, quit 07/31/68) with h/o HTN, GERD, PAD (s/p Dalm aortic stenting 04/24/2016), PUD, HLD, CAD, left knee end stage OA scheduled for above procedure 01/03/2019 with Dr. Netta Cedars.   Pt cleared by cardiology 12/27/18.  Per Leanor Kail, PA-C, "Given past medical history and time since last visit, based on ACC/AHA  guidelines,Nathan Santiago be at acceptable risk for the planned procedure without further cardiovascular testing. He is able to get > 4 mets of activity.Per Prior recommendations, "Okay to hold antiplatelet agents ( 5 days prior)for his orthopedic procedure"."  Vascular ultrasound 08/02/2018 with no evidence of AAA.  Pt can proceed with planned procedure barring acute status change.  VS: BP (!) 156/54   Pulse 67   Temp 36.6 C (Oral)   Resp 16   Ht 5\' 4"  (1.626 m)   Wt 76.7 kg   SpO2 99%   BMI 29.04 kg/m   PROVIDERS: [name], MD is PCP   [name], MD is Cardiologist  LABS: Labs reviewed: Acceptable for surgery. (all labs ordered are listed, but only abnormal results are displayed)   Labs Reviewed CBC - Abnormal; Notable for the following components:     Result Value   Hemoglobin 11.7 (*)   HCT 37.7 (*)   RDW 16.7 (*)   All other components within normal limits BASIC METABOLIC PANEL - Abnormal; Notable for the following components:  Glucose, Bld 110 (*)   All other components within normal limits SURGICAL PCR SCREEN    IMAGES:   EKG: 07/16/18 Rate 72 bpm Normal sinus rhythm  Nonspecific T wave abnormality  CV: Echo 02/07/2012 Interpretation:  A complete two-dimensional transthoracic echocardiogram was performed.  The left ventricle is normal in size There is mild concentric left ventricular hypertrophy Left ventricular systolic function is normal There is mild mitral annular calcification There is mild tricuspid regurgitation The aortic valve appears to be mildly sclerotic Trace aortic regurgitation Trace pulmonic valvular regurgitation  The transmittal spectral Doppler flow pattern is suggestive of impaired LV relaxation  Tissue doppler suggests increased LA pressure  Past Medical History: Diagnosis Date . AAA (abdominal aortic aneurysm) (HCC)   asymptomatic . AAA (abdominal aortic aneurysm)  ([place]) 2010  peripheral  angiogram-- bilateral SFA DISEASE  and 60 to 70% infrarenaal abd. aortic stenosis with 15 -mm gradient . Adenomatous colon polyp 11/2003 . CAD (coronary artery disease)  . Gait abnormality 02/13/2017 . Gastroparesis   pt denies . GERD (gastroesophageal reflux disease)   w/ LPR . History of kidney stones   x2 . Hyperlipidemia  . Hypertension  . Peripheral arterial disease ([place])   RCEA  by  Dr Lemar Livings . PUD (peptic ulcer disease)   pt unaware . Vertigo     Past Surgical History: Procedure Laterality Date . ABDOMINAL AORTOGRAM W/LOWER EXTREMITY Bilateral 08/05/2018  Procedure: ABDOMINAL AORTOGRAM W/LOWER EXTREMITY;  Surgeon: Lorretta Harp, MD;  Location: [facility];  Service: Cardiovascular;  Laterality: Bilateral; . AORTOGRAM  04/24/2016   Abdominal aortogram, bilateral iliac angiogram, bifemoral runoff . CARDIAC CATHETERIZATION  05/12/2005  RCA . carotid doppler  01/24/2013  RICA endarterectomy,left CCA 0-49%; left bulb and prox ICA 50-69%; bilaateral subclavian < 50% . CAROTID ENDARTERECTOMY  11/01/2010 . CATARACT EXTRACTION   . COLONOSCOPY   . CORONARY ANGIOPLASTY  05/18/2005  2 STENTS distal RCA AND PROXIMAL-MID RCA   5 total stents per pt. . CYSTOSCOPY/URETEROSCOPY/HOLMIUM LASER/STENT PLACEMENT Left 01/11/2018  Procedure: LEFT URETEROSCOPY/HOLMIUM LASER/STENT PLACEMENT;  Surgeon: Ardis Hughs, MD;  [facility];  Service: Urology;  Laterality: Left; . DOPPLER ECHOCARDIOGRAPHY  02/07/2012  EF 55%,SHOWED NO ISCHEMIA  . HERNIA REPAIR    umbilical . lower arterial  doppler  02/04/2013  aotra 1.5 x 1.5 cm; distal abd aorta 70-99%,proximal common iliac arteries -very stenotic with increased velocities>50%,may be falsely elevated as a result of residual plaque from the distal aorta stenosis . lower extremity doppler  June 18 ,2013  ABI'S ABNORMAL, RABI was 0.88 and LABI 0.75 ,with 3-vessel   run off . NM MYOVIEW LTD  MAY [id number]  showed no significant ischemia; . NM MYOVIEW LTD  04/22/2008  ef 77%,exercise capcity 6 METS ,exaggerated blood pressure response to exercise . PERIPHERAL VASCULAR CATHETERIZATION N/A 04/24/2016  Procedure: Lower Extremity Angiography;  Surgeon: Lorretta Harp, MD;  Location: [facility];  Service: Cardiovascular;  Laterality: N/A; . PERIPHERAL VASCULAR CATHETERIZATION  04/24/2016  Procedure: Peripheral Vascular Intervention;  Surgeon: Lorretta Harp, MD;  Location: [facility];  Service: Cardiovascular;;  Aorta . retrograde central aortic catheterization  05/19/2005  cutting balloon atherectomy, c-circ stenosis with DES STENTING CYPHER . TONSILLECTOMY   . TRANSURETHRAL RESECTION OF PROSTATE N/A 09/08/2016  Procedure: TRANSURETHRAL RESECTION OF THE PROSTATE (TURP);  Surgeon: Ardis Hughs, MD;  [facility];  Service: Urology;  Laterality: N/A; . TRANSURETHRAL RESECTION OF PROSTATE    10-10-17  Dr. Louis Meckel . TRANSURETHRAL RESECTION OF PROSTATE N/A 10/10/2017  Procedure: TRANSURETHRAL RESECTION OF THE PROSTATE (TURP);  Surgeon: Ardis Hughs, MD;  [facility];  Service: Urology;  Laterality: N/A; . VASECTOMY     MEDICATIONS:  . amLODipine (NORVASC) 5 MG tablet . Apoaequorin (PREVAGEN PO) . aspirin 325 MG tablet . aspirin EC 81 MG tablet . atenolol (TENORMIN) 50 MG tablet . atorvastatin (LIPITOR) 40 MG tablet . benzonatate (TESSALON) 100 MG capsule . Biotin 10 MG CAPS . bismuth subsalicylate (PEPTO BISMOL) 262 MG/15ML suspension . cholecalciferol (VITAMIN D3) 25 MCG (1000 UT) tablet . cilostazol (PLETAL) 100 MG tablet . clopidogrel (PLAVIX) 75 MG tablet . diazepam (VALIUM) 5 MG tablet . diphenhydrAMINE (BENADRYL) 25 MG tablet . hydrochlorothiazide 25 MG tablet . losartan (COZAAR) 50 MG tablet . Magnesium 250 MG TABS . Misc Natural Products (GLUCOSAMINE CHONDROITIN TRIPLE) TABS . Multiple  Vitamin (MULTIVITAMIN WITH MINERALS) TABS tablet . OVER THE COUNTER MEDICATION . pantoprazole (PROTONIX) 40 MG tablet . potassium chloride SA (K-DUR,KLOR-CON) 20 MEQ tablet . vitamin C (ASCORBIC ACID) 500 MG tablet   No current facility-administered medications for this encounter.    Maia Plan [facility] [phone] 12/31/18 11:40 AM  Progress Notes by Konrad Felix, PA-C at 12/27/2018 9:30 AM   Author: Konrad Felix, PA-C Service: Anesthesiology Author Type: Physician Assistant Certified Filed: 03/03/6658 11:40 AM Date of Service: 12/27/2018 9:30 AM Status: Signed Editor: Maia Plan (Physician Assistant Certified)     Show:Clear all ManualTemplateCopied  Added by: Konrad Felix, PA-C  Hover for details Anesthesia Chart Review    Case:  [id number] Date/Time:  01/03/19 0940  Procedure:  TOTAL KNEE ARTHROPLASTY (Left ) - with IS block  Anesthesia type:  General  Pre-op diagnosis:  Left knee end stage osteoarthritis  Location:  [place] [facility] / [facility]  Surgeon:  Netta Cedars, MD    DISCUSSION: 85 yo former smoker (40 pack years, quit 07/31/68) with h/o HTN, GERD, PAD (s/p Dalm aortic stenting 04/24/2016), PUD, HLD, CAD, left knee end stage OA scheduled for above procedure 01/03/2019 with Dr. Netta Cedars.   Pt cleared by cardiology 12/27/18.  Per Leanor Kail, PA-C, "Given past medical history and time since last visit, based on ACC/AHA guidelines,Nathan Santiago be at acceptable risk for the planned procedure without further cardiovascular testing. He is able to get > 4 mets of activity.Per Prior recommendations, "Okay to hold antiplatelet agents ( 5 days prior)for his orthopedic procedure"."  Vascular ultrasound 08/02/2018 with no evidence of AAA.  Pt can proceed with planned procedure barring acute status change.  VS: BP (!) 156/54   Pulse 67   Temp 36.6 C (Oral)   Resp 16   Ht 5'  4" (1.626 m)   Wt 76.7 kg   SpO2 99%   BMI 29.04 kg/m   PROVIDERS: [name], MD is PCP   [name], MD is Cardiologist  LABS: Labs reviewed: Acceptable for surgery. (all labs ordered are listed, but only abnormal results are displayed)   Labs Reviewed CBC - Abnormal; Notable for the following components:     Result Value   Hemoglobin 11.7 (*)   HCT 37.7 (*)   RDW 16.7 (*)   All other components within normal limits BASIC METABOLIC PANEL - Abnormal; Notable for the following components:  Glucose, Bld 110 (*)   All other components within normal limits SURGICAL PCR SCREEN    IMAGES:   EKG: 07/16/18 Rate 72 bpm Normal sinus rhythm  Nonspecific T wave abnormality  CV: Echo 02/07/2012 Interpretation:  A complete two-dimensional transthoracic echocardiogram was performed.  The left ventricle is normal in size There is mild concentric left ventricular hypertrophy Left ventricular systolic function is normal There is mild mitral annular calcification There is mild tricuspid regurgitation The aortic valve appears to be mildly sclerotic Trace aortic regurgitation Trace pulmonic valvular regurgitation  The transmittal spectral Doppler flow pattern is suggestive of impaired LV relaxation  Tissue doppler suggests increased LA pressure  Past Medical History: Diagnosis Date . AAA (abdominal aortic aneurysm) (HCC)   asymptomatic . AAA (abdominal aortic aneurysm) ([place]) 2010  peripheral  angiogram-- bilateral SFA DISEASE  and 60 to 70% infrarenaal abd. aortic stenosis with 15 -mm gradient . Adenomatous colon polyp 11/2003 . CAD (coronary artery disease)  . Gait abnormality 02/13/2017 . Gastroparesis   pt denies . GERD (gastroesophageal reflux disease)   w/ LPR . History of kidney stones   x2 . Hyperlipidemia  . Hypertension  . Peripheral arterial disease ([place])   RCEA  by Dr Lemar Livings . PUD (peptic ulcer  disease)   pt unaware . Vertigo     Past Surgical History: Procedure Laterality Date . ABDOMINAL AORTOGRAM  W/LOWER EXTREMITY Bilateral 08/05/2018  Procedure: ABDOMINAL AORTOGRAM W/LOWER EXTREMITY;  Surgeon: Lorretta Harp, MD;  Location: [facility];  Service: Cardiovascular;  Laterality: Bilateral; . AORTOGRAM  04/24/2016   Abdominal aortogram, bilateral iliac angiogram, bifemoral runoff . CARDIAC CATHETERIZATION  05/12/2005  RCA . carotid doppler  01/24/2013  RICA endarterectomy,left CCA 0-49%; left bulb and prox ICA 50-69%; bilaateral subclavian < 50% . CAROTID ENDARTERECTOMY  11/01/2010 . CATARACT EXTRACTION   . COLONOSCOPY   . CORONARY ANGIOPLASTY  05/18/2005  2 STENTS distal RCA AND PROXIMAL-MID RCA   5 total stents per pt. . CYSTOSCOPY/URETEROSCOPY/HOLMIUM LASER/STENT PLACEMENT Left 01/11/2018  Procedure: LEFT URETEROSCOPY/HOLMIUM LASER/STENT PLACEMENT;  Surgeon: Ardis Hughs, MD;  [facility];  Service: Urology;  Laterality: Left; . DOPPLER ECHOCARDIOGRAPHY  02/07/2012  EF 55%,SHOWED NO ISCHEMIA  . HERNIA REPAIR    umbilical . lower arterial  doppler  02/04/2013  aotra 1.5 x 1.5 cm; distal abd aorta 70-99%,proximal common iliac arteries -very stenotic with increased velocities>50%,may be falsely elevated as a result of residual plaque from the distal aorta stenosis . lower extremity doppler  June 18 ,2013  ABI'S ABNORMAL, RABI was 0.88 and LABI 0.75 ,with 3-vessel  run off . NM MYOVIEW LTD  MAY [id number]  showed no significant ischemia; . NM MYOVIEW LTD  04/22/2008  ef 77%,exercise capcity 6 METS ,exaggerated blood pressure response to exercise . PERIPHERAL VASCULAR CATHETERIZATION N/A 04/24/2016  Procedure: Lower Extremity Angiography;  Surgeon: Lorretta Harp, MD;  Location: [facility];  Service: Cardiovascular;  Laterality: N/A; . PERIPHERAL VASCULAR CATHETERIZATION  04/24/2016  Procedure: Peripheral Vascular  Intervention;  Surgeon: Lorretta Harp, MD;  Location: [facility];  Service: Cardiovascular;;  Aorta . retrograde central aortic catheterization  05/19/2005  cutting balloon atherectomy, c-circ stenosis with DES STENTING CYPHER . TONSILLECTOMY   . TRANSURETHRAL RESECTION OF PROSTATE N/A 09/08/2016  Procedure: TRANSURETHRAL RESECTION OF THE PROSTATE (TURP);  Surgeon: Ardis Hughs, MD;  [facility];  Service: Urology;  Laterality: N/A; . TRANSURETHRAL RESECTION OF PROSTATE    10-10-17  Dr. Louis Meckel . TRANSURETHRAL RESECTION OF PROSTATE N/A 10/10/2017  Procedure: TRANSURETHRAL RESECTION OF THE PROSTATE (TURP);  Surgeon: Ardis Hughs, MD;  [facility];  Service: Urology;  Laterality: N/A; . VASECTOMY     MEDICATIONS:  . amLODipine (NORVASC) 5 MG tablet . Apoaequorin (PREVAGEN PO) . aspirin 325 MG tablet . aspirin EC 81 MG tablet . atenolol (TENORMIN) 50 MG tablet . atorvastatin (LIPITOR) 40 MG tablet . benzonatate (TESSALON) 100 MG capsule . Biotin 10 MG CAPS . bismuth subsalicylate (PEPTO BISMOL) 262 MG/15ML suspension . cholecalciferol (VITAMIN D3) 25 MCG (1000 UT) tablet . cilostazol (PLETAL) 100 MG tablet . clopidogrel (PLAVIX) 75 MG tablet . diazepam (VALIUM) 5 MG tablet . diphenhydrAMINE (BENADRYL) 25 MG tablet . hydrochlorothiazide 25 MG tablet . losartan (COZAAR) 50 MG tablet . Magnesium 250 MG TABS . Misc Natural Products (GLUCOSAMINE CHONDROITIN TRIPLE) TABS . Multiple Vitamin (MULTIVITAMIN WITH MINERALS) TABS tablet . OVER THE COUNTER MEDICATION . pantoprazole (PROTONIX) 40 MG tablet . potassium chloride SA (K-DUR,KLOR-CON) 20 MEQ tablet . vitamin C (ASCORBIC ACID) 500 MG tablet   No current facility-administered medications for this encounter.    Maia Plan [facility] [phone] 12/31/18 11:40 AM       Progress Notes by Konrad Felix, PA-C at 12/27/2018 9:30 AM   Author:  Konrad Felix, PA-C Service: Anesthesiology Author Type: Physician Assistant Certified Filed: 01/06/320 11:40  AM Date of Service: 12/27/2018 9:30 AM Status: Signed Editor: Maia Plan (Physician Assistant Certified)     Show:Clear all ManualTemplateCopied  Added by: Konrad Felix, PA-C  Hover for details Anesthesia Chart Review    Case:  [id number] Date/Time:  01/03/19 0940  Procedure:  TOTAL KNEE ARTHROPLASTY (Left ) - with IS block  Anesthesia type:  General  Pre-op diagnosis:  Left knee end stage osteoarthritis  Location:  [place] [facility] / [facility]  Surgeon:  Netta Cedars, MD    DISCUSSION: 85 yo former smoker (40 pack years, quit 07/31/68) with h/o HTN, GERD, PAD (s/p Dalm aortic stenting 04/24/2016), PUD, HLD, CAD, left knee end stage OA scheduled for above procedure 01/03/2019 with Dr. Netta Cedars.   Pt cleared by cardiology 12/27/18.  Per Leanor Kail, PA-C, "Given past medical history and time since last visit, based on ACC/AHA guidelines,Nathan Santiago be at acceptable risk for the planned procedure without further cardiovascular testing. He is able to get > 4 mets of activity.Per Prior recommendations, "Okay to hold antiplatelet agents ( 5 days prior)for his orthopedic procedure"."  Vascular ultrasound 08/02/2018 with no evidence of AAA.  Pt can proceed with planned procedure barring acute status change.  VS: BP (!) 156/54   Pulse 67   Temp 36.6 C (Oral)   Resp 16   Ht 5\' 4"  (1.626 m)   Wt 76.7 kg   SpO2 99%   BMI 29.04 kg/m   PROVIDERS: [name], MD is PCP   [name], MD is Cardiologist  LABS: Labs reviewed: Acceptable for surgery. (all labs ordered are listed, but only abnormal results are displayed)   Labs Reviewed CBC - Abnormal; Notable for the following components:     Result Value   Hemoglobin 11.7 (*)   HCT 37.7 (*)   RDW 16.7 (*)   All other components within normal limits BASIC  METABOLIC PANEL - Abnormal; Notable for the following components:  Glucose, Bld 110 (*)   All other components within normal limits SURGICAL PCR SCREEN    IMAGES:   EKG: 07/16/18 Rate 72 bpm Normal sinus rhythm  Nonspecific T wave abnormality  CV: Echo 02/07/2012 Interpretation:  A complete two-dimensional transthoracic echocardiogram was performed.  The left ventricle is normal in size There is mild concentric left ventricular hypertrophy Left ventricular systolic function is normal There is mild mitral annular calcification There is mild tricuspid regurgitation The aortic valve appears to be mildly sclerotic Trace aortic regurgitation Trace pulmonic valvular regurgitation  The transmittal spectral Doppler flow pattern is suggestive of impaired LV relaxation  Tissue doppler suggests increased LA pressure  Past Medical History: Diagnosis Date . AAA (abdominal aortic aneurysm) (HCC)   asymptomatic . AAA (abdominal aortic aneurysm) ([place]) 2010  peripheral  angiogram-- bilateral SFA DISEASE  and 60 to 70% infrarenaal abd. aortic stenosis with 15 -mm gradient . Adenomatous colon polyp 11/2003 . CAD (coronary artery disease)  . Gait abnormality 02/13/2017 . Gastroparesis   pt denies . GERD (gastroesophageal reflux disease)   w/ LPR . History of kidney stones   x2 . Hyperlipidemia  . Hypertension  . Peripheral arterial disease ([place])   RCEA  by Dr Lemar Livings . PUD (peptic ulcer disease)   pt unaware . Vertigo     Past Surgical History: Procedure Laterality Date . ABDOMINAL AORTOGRAM W/LOWER EXTREMITY Bilateral 08/05/2018  Procedure: ABDOMINAL AORTOGRAM W/LOWER EXTREMITY;  Surgeon: Lorretta Harp, MD;  Location: [facility];  Service: Cardiovascular;  Laterality:  Bilateral; . AORTOGRAM  04/24/2016   Abdominal aortogram, bilateral iliac angiogram, bifemoral runoff . CARDIAC CATHETERIZATION  05/12/2005  RCA . carotid  doppler  01/24/2013  RICA endarterectomy,left CCA 0-49%; left bulb and prox ICA 50-69%; bilaateral subclavian < 50% . CAROTID ENDARTERECTOMY  11/01/2010 . CATARACT EXTRACTION   . COLONOSCOPY   . CORONARY ANGIOPLASTY  05/18/2005  2 STENTS distal RCA AND PROXIMAL-MID RCA   5 total stents per pt. . CYSTOSCOPY/URETEROSCOPY/HOLMIUM LASER/STENT PLACEMENT Left 01/11/2018  Procedure: LEFT URETEROSCOPY/HOLMIUM LASER/STENT PLACEMENT;  Surgeon: Ardis Hughs, MD;  [facility];  Service: Urology;  Laterality: Left; . DOPPLER ECHOCARDIOGRAPHY  02/07/2012  EF 55%,SHOWED NO ISCHEMIA  . HERNIA REPAIR    umbilical . lower arterial  doppler  02/04/2013  aotra 1.5 x 1.5 cm; distal abd aorta 70-99%,proximal common iliac arteries -very stenotic with increased velocities>50%,may be falsely elevated as a result of residual plaque from the distal aorta stenosis . lower extremity doppler  June 18 ,2013  ABI'S ABNORMAL, RABI was 0.88 and LABI 0.75 ,with 3-vessel  run off . NM MYOVIEW LTD  MAY [id number]  showed no significant ischemia; . NM MYOVIEW LTD  04/22/2008  ef 77%,exercise capcity 6 METS ,exaggerated blood pressure response to exercise . PERIPHERAL VASCULAR CATHETERIZATION N/A 04/24/2016  Procedure: Lower Extremity Angiography;  Surgeon: Lorretta Harp, MD;  Location: [place] [facility];  Service: Cardiovascular;  Laterality: N/A; . PERIPHERAL VASCULAR CATHETERIZATION  04/24/2016  Procedure: Peripheral Vascular Intervention;  Surgeon: Lorretta Harp, MD;  Location: [facility];  Service: Cardiovascular;;  Aorta . retrograde central aortic catheterization  05/19/2005  cutting balloon atherectomy, c-circ stenosis with DES STENTING CYPHER . TONSILLECTOMY   . TRANSURETHRAL RESECTION OF PROSTATE N/A 09/08/2016  Procedure: TRANSURETHRAL RESECTION OF THE PROSTATE (TURP);  Surgeon: Ardis Hughs, MD;  [facility];  Service: Urology;  Laterality:  N/A; . TRANSURETHRAL RESECTION OF PROSTATE    10-10-17  Dr. Louis Meckel . TRANSURETHRAL RESECTION OF PROSTATE N/A 10/10/2017  Procedure: TRANSURETHRAL RESECTION OF THE PROSTATE (TURP);  Surgeon: Ardis Hughs, MD;  [facility];  Service: Urology;  Laterality: N/A; . VASECTOMY     MEDICATIONS:  . amLODipine (NORVASC) 5 MG tablet . Apoaequorin (PREVAGEN PO) . aspirin 325 MG tablet . aspirin EC 81 MG tablet . atenolol (TENORMIN) 50 MG tablet . atorvastatin (LIPITOR) 40 MG tablet . benzonatate (TESSALON) 100 MG capsule . Biotin 10 MG CAPS . bismuth subsalicylate (PEPTO BISMOL) 262 MG/15ML suspension . cholecalciferol (VITAMIN D3) 25 MCG (1000 UT) tablet . cilostazol (PLETAL) 100 MG tablet . clopidogrel (PLAVIX) 75 MG tablet . diazepam (VALIUM) 5 MG tablet . diphenhydrAMINE (BENADRYL) 25 MG tablet . hydrochlorothiazide 25 MG tablet . losartan (COZAAR) 50 MG tablet . Magnesium 250 MG TABS . Misc Natural Products (GLUCOSAMINE CHONDROITIN TRIPLE) TABS . Multiple Vitamin (MULTIVITAMIN WITH MINERALS) TABS tablet . OVER THE COUNTER MEDICATION . pantoprazole (PROTONIX) 40 MG tablet . potassium chloride SA (K-DUR,KLOR-CON) 20 MEQ tablet . vitamin C (ASCORBIC ACID) 500 MG tablet   No current facility-administered medications for this encounter.    [facility] Plan [facility] [phone] 12/31/18 11:40 AM       Progress Notes by Konrad Felix, PA-C at 12/27/2018 9:30 AM   Author: Konrad Felix, PA-C Service: Anesthesiology Author Type: Physician Assistant Certified Filed: 12/01/6501 11:40 AM Date of Service: 12/27/2018 9:30 AM Status: Signed Editor: Maia Plan (Physician Assistant Certified)     Show:Clear all ManualTemplateCopied  Added by: Carlena Bjornstad,  Maylon Peppers  Hover for details Anesthesia Chart Review    Case:  [id number] Date/Time:  01/03/19 0940  Procedure:  TOTAL KNEE ARTHROPLASTY (Left ) - with IS  block  Anesthesia type:  General  Pre-op diagnosis:  Left knee end stage osteoarthritis  [facility]  Surgeon:  Netta Cedars, MD    DISCUSSION: 85 yo former smoker (40 pack years, quit 07/31/68) with h/o HTN, GERD, PAD (s/p Dalm aortic stenting 04/24/2016), PUD, HLD, CAD, left knee end stage OA scheduled for above procedure 01/03/2019 with Dr. Netta Cedars.   Pt cleared by cardiology 12/27/18.  Per Leanor Kail, PA-C, "Given past medical history and time since last visit, based on ACC/AHA guidelines,Nathan Santiago be at acceptable risk for the planned procedure without further cardiovascular testing. He is able to get > 4 mets of activity.Per Prior recommendations, "Okay to hold antiplatelet agents ( 5 days prior)for his orthopedic procedure"."  Vascular ultrasound 08/02/2018 with no evidence of AAA.  Pt can proceed with planned procedure barring acute status change.  VS: BP (!) 156/54   Pulse 67   Temp 36.6 C (Oral)   Resp 16   Ht 5\' 4"  (1.626 m)   Wt 76.7 kg   SpO2 99%   BMI 29.04 kg/m   PROVIDERS: [name], MD is PCP   [name], MD is Cardiologist  LABS: Labs reviewed: Acceptable for surgery. (all labs ordered are listed, but only abnormal results are displayed)   Labs Reviewed CBC - Abnormal; Notable for the following components:     Result Value   Hemoglobin 11.7 (*)   HCT 37.7 (*)   RDW 16.7 (*)   All other components within normal limits BASIC METABOLIC PANEL - Abnormal; Notable for the following components:  Glucose, Bld 110 (*)   All other components within normal limits SURGICAL PCR SCREEN    IMAGES:   EKG: 07/16/18 Rate 72 bpm Normal sinus rhythm  Nonspecific T wave abnormality  CV: Echo 02/07/2012 Interpretation:  A complete two-dimensional transthoracic echocardiogram was performed.  The left ventricle is normal in size There is mild concentric left ventricular  hypertrophy Left ventricular systolic function is normal There is mild mitral annular calcification There is mild tricuspid regurgitation The aortic valve appears to be mildly sclerotic Trace aortic regurgitation Trace pulmonic valvular regurgitation  The transmittal spectral Doppler flow pattern is suggestive of impaired LV relaxation  Tissue doppler suggests increased LA pressure  Past Medical History: Diagnosis Date . AAA (abdominal aortic aneurysm) (HCC)   asymptomatic . AAA (abdominal aortic aneurysm) ([place]) 2010  peripheral  angiogram-- bilateral SFA DISEASE  and 60 to 70% infrarenaal abd. aortic stenosis with 15 -mm gradient . Adenomatous colon polyp 11/2003 . CAD (coronary artery disease)  . Gait abnormality 02/13/2017 . Gastroparesis   pt denies . GERD (gastroesophageal reflux disease)   w/ LPR . History of kidney stones   x2 . Hyperlipidemia  . Hypertension  . Peripheral arterial disease ([place])   RCEA  by Dr Lemar Livings . PUD (peptic ulcer disease)   pt unaware . Vertigo     Past Surgical History: Procedure Laterality Date . ABDOMINAL AORTOGRAM W/LOWER EXTREMITY Bilateral 08/05/2018  Procedure: ABDOMINAL AORTOGRAM W/LOWER EXTREMITY;  Surgeon: Lorretta Harp, MD;  Location: [facility];  Service: Cardiovascular;  Laterality: Bilateral; . AORTOGRAM  04/24/2016   Abdominal aortogram, bilateral iliac angiogram, bifemoral runoff . CARDIAC CATHETERIZATION  05/12/2005  RCA . carotid doppler  01/24/2013  RICA endarterectomy,left CCA 0-49%; left bulb and prox ICA 50-69%; bilaateral subclavian < 50% . CAROTID ENDARTERECTOMY  11/01/2010 . CATARACT EXTRACTION   . COLONOSCOPY   . CORONARY ANGIOPLASTY  05/18/2005  2 STENTS distal RCA AND PROXIMAL-MID RCA   5 total stents per pt. . CYSTOSCOPY/URETEROSCOPY/HOLMIUM LASER/STENT PLACEMENT Left 01/11/2018  Procedure: LEFT URETEROSCOPY/HOLMIUM LASER/STENT PLACEMENT;  Surgeon: Ardis Hughs, MD;  [facility];  Service: Urology;  Laterality: Left; . DOPPLER ECHOCARDIOGRAPHY  02/07/2012  EF 55%,SHOWED NO ISCHEMIA  . HERNIA REPAIR    umbilical . lower arterial  doppler  02/04/2013  aotra 1.5 x 1.5 cm; distal abd aorta 70-99%,proximal common iliac arteries -very stenotic with increased velocities>50%,may be falsely elevated as a result of residual plaque from the distal aorta stenosis . lower extremity doppler  June 18 ,2013  ABI'S ABNORMAL, RABI was 0.88 and LABI 0.75 ,with 3-vessel  run off . NM MYOVIEW LTD  MAY [id number]  showed no significant ischemia; . NM MYOVIEW LTD  04/22/2008  ef 77%,exercise capcity 6 METS ,exaggerated blood pressure response to exercise . PERIPHERAL VASCULAR CATHETERIZATION N/A 04/24/2016  Procedure: Lower Extremity Angiography;  Surgeon: Lorretta Harp, MD;  Location: [facility];  Service: Cardiovascular;  Laterality: N/A; . PERIPHERAL VASCULAR CATHETERIZATION  04/24/2016  Procedure: Peripheral Vascular Intervention;  Surgeon: Lorretta Harp, MD;  Location: [facility];  Service: Cardiovascular;;  Aorta . retrograde central aortic catheterization  05/19/2005  cutting balloon atherectomy, c-circ stenosis with DES STENTING CYPHER . TONSILLECTOMY   . TRANSURETHRAL RESECTION OF PROSTATE N/A 09/08/2016  Procedure: TRANSURETHRAL RESECTION OF THE PROSTATE (TURP);  Surgeon: Ardis Hughs, MD;  [facility];  Service: Urology;  Laterality: N/A; . TRANSURETHRAL RESECTION OF PROSTATE    10-10-17  Dr. Louis Meckel . TRANSURETHRAL RESECTION OF PROSTATE N/A 10/10/2017  Procedure: TRANSURETHRAL RESECTION OF THE PROSTATE (TURP);  Surgeon: Ardis Hughs, MD;  [facility];  Service: Urology;  Laterality: N/A; . VASECTOMY     MEDICATIONS:  . amLODipine (NORVASC) 5 MG tablet . Apoaequorin (PREVAGEN PO) . aspirin 325 MG tablet . aspirin EC 81 MG tablet . atenolol (TENORMIN) 50 MG  tablet . atorvastatin (LIPITOR) 40 MG tablet . benzonatate (TESSALON) 100 MG capsule . Biotin 10 MG CAPS . bismuth subsalicylate (PEPTO BISMOL) 262 MG/15ML suspension . cholecalciferol (VITAMIN D3) 25 MCG (1000 UT) tablet . cilostazol (PLETAL) 100 MG tablet . clopidogrel (PLAVIX) 75 MG tablet . diazepam (VALIUM) 5 MG tablet . diphenhydrAMINE (BENADRYL) 25 MG tablet . hydrochlorothiazide 25 MG tablet . losartan (COZAAR) 50 MG tablet . Magnesium 250 MG TABS . Misc Natural Products (GLUCOSAMINE CHONDROITIN TRIPLE) TABS . Multiple Vitamin (MULTIVITAMIN WITH MINERALS) TABS tablet . OVER THE COUNTER MEDICATION . pantoprazole (PROTONIX) 40 MG tablet . potassium chloride SA (K-DUR,KLOR-CON) 20 MEQ tablet . vitamin C (ASCORBIC ACID) 500 MG tablet   No current facility-administered medications for this encounter.    [facility] [phone] 12/31/18 11:40 AM  Progress Notes by Konrad Felix, PA-C at 12/27/2018 9:30 AM   Author: Konrad Felix, PA-C Service: Anesthesiology Author Type: Physician Assistant Certified Filed: 03/06/7671 11:40 AM Date of Service: 12/27/2018 9:30 AM Status: Signed Editor: Maia Plan (Physician Assistant Certified)     Show:Clear all ManualTemplateCopied  Added by: Konrad Felix, PA-C  Hover for details Anesthesia Chart Review    Case:  [id number] Date/Time:  01/03/19 0940  Procedure:  TOTAL KNEE ARTHROPLASTY (Left ) - with IS block  Anesthesia type:  General  Pre-op diagnosis:  Left knee end stage osteoarthritis  Location:  [facility]  Surgeon:  Netta Cedars, MD    DISCUSSION: 85 yo former smoker (40 pack years, quit 07/31/68) with h/o HTN, GERD, PAD (s/p Dalm aortic stenting 04/24/2016), PUD, HLD, CAD, left knee end stage OA scheduled for above procedure 01/03/2019 with Dr. Netta Cedars.   Pt cleared by cardiology 12/27/18.  Per Leanor Kail, PA-C, "Given past  medical history and time since last visit, based on ACC/AHA guidelines,Nathan Santiago be at acceptable risk for the planned procedure without further cardiovascular testing. He is able to get > 4 mets of activity.Per Prior recommendations, "Okay to hold antiplatelet agents ( 5 days prior)for his orthopedic procedure"."  Vascular ultrasound 08/02/2018 with no evidence of AAA.  Pt can proceed with planned procedure barring acute status change.  VS: BP (!) 156/54   Pulse 67   Temp 36.6 C (Oral)   Resp 16   Ht 5\' 4"  (1.626 m)   Wt 76.7 kg   SpO2 99%   BMI 29.04 kg/m   PROVIDERS: [name], MD is PCP   [name], MD is Cardiologist  LABS: Labs reviewed: Acceptable for surgery. (all labs ordered are listed, but only abnormal results are displayed)   Labs Reviewed CBC - Abnormal; Notable for the following components:     Result Value   Hemoglobin 11.7 (*)   HCT 37.7 (*)   RDW 16.7 (*)   All other components within normal limits BASIC METABOLIC PANEL - Abnormal; Notable for the following components:  Glucose, Bld 110 (*)   All other components within normal limits SURGICAL PCR SCREEN    IMAGES:   EKG: 07/16/18 Rate 72 bpm Normal sinus rhythm  Nonspecific T wave abnormality  CV: Echo 02/07/2012 Interpretation:  A complete two-dimensional transthoracic echocardiogram was performed.  The left ventricle is normal in size There is mild concentric left ventricular hypertrophy Left ventricular systolic function is normal There is mild mitral annular calcification There is mild tricuspid regurgitation The aortic valve appears to be mildly sclerotic Trace aortic regurgitation Trace pulmonic valvular regurgitation  The transmittal spectral Doppler flow pattern is suggestive of impaired LV relaxation  Tissue doppler suggests increased LA pressure  Past Medical History: Diagnosis Date . AAA (abdominal aortic aneurysm)  (HCC)   asymptomatic . AAA (abdominal aortic aneurysm) ([place]) 2010  peripheral  angiogram-- bilateral SFA DISEASE  and 60 to 70% infrarenaal abd. aortic stenosis with 15 -mm gradient . Adenomatous colon polyp 11/2003 . CAD (coronary artery disease)  . Gait abnormality 02/13/2017 . Gastroparesis   pt denies . GERD (gastroesophageal reflux disease)   w/ LPR . History of kidney stones   x2 . Hyperlipidemia  . Hypertension  . Peripheral arterial disease ([place])   RCEA  by Dr Lemar Livings . PUD (peptic ulcer disease)   pt unaware . Vertigo     Past Surgical History: Procedure Laterality Date . ABDOMINAL AORTOGRAM W/LOWER EXTREMITY Bilateral 08/05/2018  Procedure: ABDOMINAL AORTOGRAM W/LOWER EXTREMITY;  Surgeon: Lorretta Harp, MD;  Location: [facility];  Service: Cardiovascular;  Laterality: Bilateral; . AORTOGRAM  04/24/2016   Abdominal aortogram, bilateral iliac angiogram, bifemoral runoff . CARDIAC CATHETERIZATION  05/12/2005  RCA . carotid doppler  01/24/2013  RICA endarterectomy,left CCA 0-49%; left bulb and prox ICA 50-69%; bilaateral subclavian < 50% . CAROTID ENDARTERECTOMY  11/01/2010 . CATARACT EXTRACTION   . COLONOSCOPY   . CORONARY ANGIOPLASTY  05/18/2005  2 STENTS distal RCA AND PROXIMAL-MID RCA   5 total stents per pt. . CYSTOSCOPY/URETEROSCOPY/HOLMIUM LASER/STENT PLACEMENT Left 01/11/2018  Procedure: LEFT URETEROSCOPY/HOLMIUM LASER/STENT PLACEMENT;  Surgeon: Ardis Hughs, MD;  [facility];  Service: Urology;  Laterality: Left; . DOPPLER ECHOCARDIOGRAPHY  02/07/2012  EF 55%,SHOWED NO ISCHEMIA  . HERNIA REPAIR    umbilical . lower arterial  doppler  02/04/2013  aotra 1.5 x 1.5 cm; distal abd aorta 70-99%,proximal common iliac arteries -very stenotic with increased velocities>50%,may be falsely elevated as a result of residual plaque from the distal aorta stenosis . lower extremity doppler  June 18  ,2013  ABI'S ABNORMAL, RABI was 0.88 and LABI 0.75 ,with 3-vessel  run off . NM MYOVIEW LTD  MAY [id number]  showed no significant ischemia; . NM MYOVIEW LTD  04/22/2008  ef 77%,exercise capcity 6 METS ,exaggerated blood pressure response to exercise . PERIPHERAL VASCULAR CATHETERIZATION N/A 04/24/2016  Procedure: Lower Extremity Angiography;  Surgeon: Lorretta Harp, MD;  Location: [facility];  Service: Cardiovascular;  Laterality: N/A; . PERIPHERAL VASCULAR CATHETERIZATION  04/24/2016  Procedure: Peripheral Vascular Intervention;  Surgeon: Lorretta Harp, MD;  Location: [facility];  Service: Cardiovascular;;  Aorta . retrograde central aortic catheterization  05/19/2005  cutting balloon atherectomy, c-circ stenosis with DES STENTING CYPHER . TONSILLECTOMY   . TRANSURETHRAL RESECTION OF PROSTATE N/A 09/08/2016  Procedure: TRANSURETHRAL RESECTION OF THE PROSTATE (TURP);  Surgeon: Ardis Hughs, MD;  [facility];  Service: Urology;  Laterality: N/A; . TRANSURETHRAL RESECTION OF PROSTATE    10-10-17  Dr. Louis Meckel . TRANSURETHRAL RESECTION OF PROSTATE N/A 10/10/2017  Procedure: TRANSURETHRAL RESECTION OF THE PROSTATE (TURP);  Surgeon: Ardis Hughs, MD;  [facility];  Service: Urology;  Laterality: N/A; . VASECTOMY     MEDICATIONS:  . amLODipine (NORVASC) 5 MG tablet . Apoaequorin (PREVAGEN PO) . aspirin 325 MG tablet . aspirin EC 81 MG tablet . atenolol (TENORMIN) 50 MG tablet . atorvastatin (LIPITOR) 40 MG tablet . benzonatate (TESSALON) 100 MG capsule . Biotin 10 MG CAPS . bismuth subsalicylate (PEPTO BISMOL) 262 MG/15ML suspension . cholecalciferol (VITAMIN D3) 25 MCG (1000 UT) tablet . cilostazol (PLETAL) 100 MG tablet . clopidogrel (PLAVIX) 75 MG tablet . diazepam (VALIUM) 5 MG tablet . diphenhydrAMINE (BENADRYL) 25 MG tablet . hydrochlorothiazide 25 MG tablet . losartan (COZAAR) 50 MG tablet . Magnesium 250 MG TABS . Misc  Natural Products (GLUCOSAMINE CHONDROITIN TRIPLE) TABS . Multiple Vitamin (MULTIVITAMIN WITH MINERALS) TABS tablet . OVER THE COUNTER MEDICATION . pantoprazole (PROTONIX) 40 MG tablet . potassium chloride SA (K-DUR,KLOR-CON) 20 MEQ tablet . vitamin C (ASCORBIC ACID) 500 MG tablet   No current facility-administered medications for this encounter.    Maia Plan [facility] [phone] 12/31/18 11:40 AM            Reproductive/Obstetrics                          Anesthesia Physical Anesthesia Plan  ASA: III  Anesthesia Plan: General   Post-op Pain Management:  Regional for Post-op pain   Induction: Intravenous  PONV Risk Score and Plan: 2 and Ondansetron and Dexamethasone  Airway Management Planned: LMA  Additional Equipment:   Intra-op Plan:   Post-operative Plan: Extubation in OR  Informed Consent: I have reviewed the patients History and Physical, chart, labs and discussed the procedure including the risks, benefits and alternatives for the proposed  anesthesia with the patient or authorized representative who has indicated his/her understanding and acceptance.     Dental advisory given  Plan Discussed with: CRNA and Surgeon  Anesthesia Plan Comments: (See PAT note 12/27/2018, Konrad Felix, PA-C)     Anesthesia Quick Evaluation                                  Anesthesia Evaluation  Patient identified by MRN, date of birth, ID band Patient awake    Reviewed: Allergy & Precautions, NPO status , Patient's Chart, lab work & pertinent test results  Airway Mallampati: II  TM Distance: >3 FB Neck ROM: Full    Dental no notable dental hx.    Pulmonary neg pulmonary ROS, former smoker,    Pulmonary exam normal breath sounds clear to auscultation       Cardiovascular hypertension, + CAD and + Peripheral Vascular Disease  Normal cardiovascular exam Rhythm:Regular Rate:Normal      Neuro/Psych negative neurological ROS  negative psych ROS   GI/Hepatic Neg liver ROS, GERD  ,  Endo/Other  negative endocrine ROS  Renal/GU negative Renal ROS  negative genitourinary   Musculoskeletal negative musculoskeletal ROS (+)   Abdominal   Peds negative pediatric ROS (+)  Hematology  (+) anemia ,   Anesthesia Other Findings   Reproductive/Obstetrics negative OB ROS                             Anesthesia Physical Anesthesia Plan  ASA: III  Anesthesia Plan: General   Post-op Pain Management:    Induction: Intravenous  PONV Risk Score and Plan: 2 and Ondansetron, Dexamethasone and Treatment may vary due to age or medical condition  Airway Management Planned: LMA  Additional Equipment:   Intra-op Plan:   Post-operative Plan:   Informed Consent: I have reviewed the patients History and Physical, chart, labs and discussed the procedure including the risks, benefits and alternatives for the proposed anesthesia with the patient or authorized representative who has indicated his/her understanding and acceptance.   Dental advisory given  Plan Discussed with: CRNA and Surgeon  Anesthesia Plan Comments:         Anesthesia Quick Evaluation                                   Anesthesia Evaluation  Patient identified by MRN, date of birth, ID band Patient awake    Reviewed: Allergy & Precautions, NPO status , Patient's Chart, lab work & pertinent test results  Airway Mallampati: II  TM Distance: >3 FB Neck ROM: Full    Dental no notable dental hx.    Pulmonary neg pulmonary ROS, former smoker,    Pulmonary exam normal breath sounds clear to auscultation       Cardiovascular hypertension, + CAD and + Peripheral Vascular Disease  Normal cardiovascular exam Rhythm:Regular Rate:Normal     Neuro/Psych negative neurological ROS  negative psych ROS   GI/Hepatic Neg liver ROS, GERD  ,  Endo/Other   negative endocrine ROS  Renal/GU negative Renal ROS  negative genitourinary   Musculoskeletal negative musculoskeletal ROS (+)   Abdominal   Peds negative pediatric ROS (+)  Hematology  (+) anemia ,   Anesthesia Other Findings   Reproductive/Obstetrics negative OB ROS  Anesthesia Physical Anesthesia Plan  ASA: III  Anesthesia Plan: General   Post-op Pain Management:    Induction: Intravenous  PONV Risk Score and Plan: 2 and Ondansetron, Dexamethasone and Treatment may vary due to age or medical condition  Airway Management Planned: LMA  Additional Equipment:   Intra-op Plan:   Post-operative Plan:   Informed Consent: I have reviewed the patients History and Physical, chart, labs and discussed the procedure including the risks, benefits and alternatives for the proposed anesthesia with the patient or authorized representative who has indicated his/her understanding and acceptance.   Dental advisory given  Plan Discussed with: CRNA and Surgeon  Anesthesia Plan Comments:         Anesthesia Quick Evaluation

## 2019-01-01 LAB — NOVEL CORONAVIRUS, NAA (HOSP ORDER, SEND-OUT TO REF LAB; TAT 18-24 HRS): SARS-CoV-2, NAA: NOT DETECTED

## 2019-01-01 NOTE — Progress Notes (Signed)
SPOKE WITH PATIENT, PATIENT CALLED FROM [facility] AT OFFICE AND TOLD TIME CHANGED TO 730 AM, PATIENT TOLD TO FOLLOW ALL PRE OP INSTRUCTIONS GIVEN AND ARRIVE 530 AM. 01-02-19

## 2019-01-02 ENCOUNTER — Encounter (HOSPITAL_COMMUNITY): Payer: Self-pay | Admitting: Anesthesiology

## 2019-01-02 NOTE — Progress Notes (Signed)
SPOKE W/  _ [facility]    SCREENING SYMPTOMS OF COVID 19:   COUGH--NO  RUNNY NOSE---NO  SORE THROAT---NO  NASAL CONGESTION----NO  SNEEZING----NO  SHORTNESS OF BREATH---NO  DIFFICULTY BREATHING---NO  TEMP >100.0 -----NO  UNEXPLAINED BODY ACHES------NO  CHILLS --------NO  HEADACHES ---------NO  LOSS OF SMELL/ TASTE --------NO    HAVE YOU OR ANY FAMILY MEMBER TRAVELLED PAST 14 DAYS OUT OF THE   COUNTY---NO STATE----NO COUNTRY----NO  HAVE YOU OR ANY FAMILY MEMBER BEEN EXPOSED TO ANYONE WITH COVID 19?  NO

## 2019-01-03 ENCOUNTER — Ambulatory Visit (HOSPITAL_COMMUNITY): Payer: Medicare Other | Admitting: Physician Assistant

## 2019-01-03 ENCOUNTER — Other Ambulatory Visit: Payer: Self-pay

## 2019-01-03 ENCOUNTER — Encounter (HOSPITAL_COMMUNITY)
Admission: AD | Disposition: A | Discharge status: Skilled Nursing Facility | Payer: Medicare Other | Source: Home / Self Care | Attending: Orthopedic Surgery

## 2019-01-03 ENCOUNTER — Ambulatory Visit (HOSPITAL_COMMUNITY): Payer: Medicare Other | Admitting: Anesthesiology

## 2019-01-03 ENCOUNTER — Encounter (HOSPITAL_COMMUNITY): Payer: Self-pay | Admitting: Certified Registered Nurse Anesthetist

## 2019-01-03 ENCOUNTER — Inpatient Hospital Stay (HOSPITAL_COMMUNITY)
Admission: AD | Admit: 2019-01-03 | Discharge: 2019-01-07 | DRG: 470 | Disposition: A | Discharge status: Skilled Nursing Facility | Payer: Medicare Other | Attending: Orthopedic Surgery | Admitting: Orthopedic Surgery

## 2019-01-03 DIAGNOSIS — E876 Hypokalemia: Secondary | ICD-10-CM | POA: Diagnosis not present

## 2019-01-03 DIAGNOSIS — Z87891 Personal history of nicotine dependence: Secondary | ICD-10-CM

## 2019-01-03 DIAGNOSIS — K219 Gastro-esophageal reflux disease without esophagitis: Secondary | ICD-10-CM | POA: Diagnosis present

## 2019-01-03 DIAGNOSIS — I2510 Atherosclerotic heart disease of native coronary artery without angina pectoris: Secondary | ICD-10-CM | POA: Diagnosis present

## 2019-01-03 DIAGNOSIS — Z79899 Other long term (current) drug therapy: Secondary | ICD-10-CM

## 2019-01-03 DIAGNOSIS — Z7902 Long term (current) use of antithrombotics/antiplatelets: Secondary | ICD-10-CM | POA: Diagnosis not present

## 2019-01-03 DIAGNOSIS — F419 Anxiety disorder, unspecified: Secondary | ICD-10-CM | POA: Diagnosis present

## 2019-01-03 DIAGNOSIS — E785 Hyperlipidemia, unspecified: Secondary | ICD-10-CM | POA: Diagnosis present

## 2019-01-03 DIAGNOSIS — I714 Abdominal aortic aneurysm, without rupture: Secondary | ICD-10-CM | POA: Diagnosis present

## 2019-01-03 DIAGNOSIS — M25562 Pain in left knee: Secondary | ICD-10-CM | POA: Diagnosis present

## 2019-01-03 DIAGNOSIS — K3184 Gastroparesis: Secondary | ICD-10-CM | POA: Diagnosis present

## 2019-01-03 DIAGNOSIS — Z7982 Long term (current) use of aspirin: Secondary | ICD-10-CM

## 2019-01-03 DIAGNOSIS — Z955 Presence of coronary angioplasty implant and graft: Secondary | ICD-10-CM

## 2019-01-03 DIAGNOSIS — I739 Peripheral vascular disease, unspecified: Secondary | ICD-10-CM | POA: Diagnosis present

## 2019-01-03 DIAGNOSIS — D72829 Elevated white blood cell count, unspecified: Secondary | ICD-10-CM | POA: Diagnosis not present

## 2019-01-03 DIAGNOSIS — I10 Essential (primary) hypertension: Secondary | ICD-10-CM | POA: Diagnosis present

## 2019-01-03 DIAGNOSIS — Z888 Allergy status to other drugs, medicaments and biological substances status: Secondary | ICD-10-CM | POA: Diagnosis not present

## 2019-01-03 DIAGNOSIS — M1712 Unilateral primary osteoarthritis, left knee: Secondary | ICD-10-CM | POA: Diagnosis present

## 2019-01-03 DIAGNOSIS — M25762 Osteophyte, left knee: Secondary | ICD-10-CM | POA: Diagnosis present

## 2019-01-03 DIAGNOSIS — Z8249 Family history of ischemic heart disease and other diseases of the circulatory system: Secondary | ICD-10-CM

## 2019-01-03 DIAGNOSIS — Z96652 Presence of left artificial knee joint: Secondary | ICD-10-CM | POA: Diagnosis not present

## 2019-01-03 DIAGNOSIS — Z808 Family history of malignant neoplasm of other organs or systems: Secondary | ICD-10-CM

## 2019-01-03 HISTORY — PX: TOTAL KNEE ARTHROPLASTY: SHX125

## 2019-01-03 SURGERY — ARTHROPLASTY, KNEE, TOTAL
Anesthesia: General | Site: Knee | Laterality: Left

## 2019-01-03 MED ORDER — PHENYLEPHRINE 40 MCG/ML (10ML) SYRINGE FOR IV PUSH (FOR BLOOD PRESSURE SUPPORT)
PREFILLED_SYRINGE | INTRAVENOUS | Status: AC
  Filled 2019-01-03: qty 10

## 2019-01-03 MED ORDER — CEFAZOLIN SODIUM-DEXTROSE 2-4 GM/100ML-% IV SOLN
2.0000 g | INTRAVENOUS | Status: AC
  Administered 2019-01-03: 2 g via INTRAVENOUS
  Filled 2019-01-03: qty 100

## 2019-01-03 MED ORDER — CEFAZOLIN SODIUM-DEXTROSE 2-4 GM/100ML-% IV SOLN
2.0000 g | Freq: Four times a day (QID) | INTRAVENOUS | Status: AC
  Administered 2019-01-03 (×2): 2 g via INTRAVENOUS
  Filled 2019-01-03 (×3): qty 100

## 2019-01-03 MED ORDER — BISMUTH SUBSALICYLATE 262 MG/15ML PO SUSP
30.0000 mL | Freq: Four times a day (QID) | ORAL | Status: DC | PRN
  Filled 2019-01-03: qty 236

## 2019-01-03 MED ORDER — EPHEDRINE 5 MG/ML INJ
INTRAVENOUS | Status: AC
  Filled 2019-01-03: qty 10

## 2019-01-03 MED ORDER — ASPIRIN EC 81 MG PO TBEC: 81.0000 mg | DELAYED_RELEASE_TABLET | Freq: Two times a day (BID) | ORAL | 0 refills | Status: DC

## 2019-01-03 MED ORDER — SODIUM CHLORIDE 0.9 % IR SOLN
Status: DC | PRN
  Administered 2019-01-03: 1000 mL

## 2019-01-03 MED ORDER — MENTHOL 3 MG MT LOZG: 1.0000 | LOZENGE | OROMUCOSAL | Status: DC | PRN

## 2019-01-03 MED ORDER — EPHEDRINE SULFATE-NACL 50-0.9 MG/10ML-% IV SOSY
PREFILLED_SYRINGE | INTRAVENOUS | Status: DC | PRN
  Administered 2019-01-03: 10 mg via INTRAVENOUS

## 2019-01-03 MED ORDER — METOCLOPRAMIDE HCL 5 MG/ML IJ SOLN: 5.0000 mg | Freq: Three times a day (TID) | INTRAMUSCULAR | Status: DC | PRN

## 2019-01-03 MED ORDER — LIDOCAINE 2% (20 MG/ML) 5 ML SYRINGE
INTRAMUSCULAR | Status: AC
  Filled 2019-01-03: qty 5

## 2019-01-03 MED ORDER — BUPIVACAINE-EPINEPHRINE (PF) 0.5% -1:200000 IJ SOLN
INTRAMUSCULAR | Status: DC | PRN
  Administered 2019-01-03 (×2): 5 mL via PERINEURAL

## 2019-01-03 MED ORDER — FENTANYL CITRATE (PF) 100 MCG/2ML IJ SOLN
INTRAMUSCULAR | Status: AC
  Filled 2019-01-03: qty 2

## 2019-01-03 MED ORDER — POLYETHYLENE GLYCOL 3350 17 G PO PACK: 17.0000 g | PACK | Freq: Every day | ORAL | Status: DC | PRN

## 2019-01-03 MED ORDER — VITAMIN D 25 MCG (1000 UNIT) PO TABS
1000.0000 [IU] | ORAL_TABLET | Freq: Every day | ORAL | Status: DC
  Administered 2019-01-04 – 2019-01-07 (×4): 1000 [IU] via ORAL
  Filled 2019-01-03 (×4): qty 1

## 2019-01-03 MED ORDER — ONDANSETRON HCL 4 MG/2ML IJ SOLN: 4.0000 mg | Freq: Four times a day (QID) | INTRAMUSCULAR | Status: DC | PRN

## 2019-01-03 MED ORDER — FENTANYL CITRATE (PF) 100 MCG/2ML IJ SOLN
INTRAMUSCULAR | Status: DC | PRN
  Administered 2019-01-03: 50 ug via INTRAVENOUS
  Administered 2019-01-03: 25 ug via INTRAVENOUS
  Administered 2019-01-03 (×2): 50 ug via INTRAVENOUS
  Administered 2019-01-03 (×2): 25 ug via INTRAVENOUS
  Administered 2019-01-03: 50 ug via INTRAVENOUS
  Administered 2019-01-03: 25 ug via INTRAVENOUS

## 2019-01-03 MED ORDER — BISACODYL 10 MG RE SUPP: 10.0000 mg | Freq: Every day | RECTAL | Status: DC | PRN

## 2019-01-03 MED ORDER — LACTATED RINGERS IV SOLN
INTRAVENOUS | Status: DC
  Administered 2019-01-03: 07:00:00 via INTRAVENOUS

## 2019-01-03 MED ORDER — POTASSIUM CHLORIDE CRYS ER 20 MEQ PO TBCR
20.0000 meq | EXTENDED_RELEASE_TABLET | Freq: Every evening | ORAL | Status: DC
  Administered 2019-01-03 – 2019-01-06 (×4): 20 meq via ORAL
  Filled 2019-01-03 (×4): qty 1

## 2019-01-03 MED ORDER — BUPIVACAINE-EPINEPHRINE (PF) 0.5% -1:200000 IJ SOLN: INTRAMUSCULAR | Status: DC | PRN

## 2019-01-03 MED ORDER — SODIUM CHLORIDE 0.9 % IV SOLN
INTRAVENOUS | Status: DC
  Administered 2019-01-03 – 2019-01-06 (×4): via INTRAVENOUS

## 2019-01-03 MED ORDER — POVIDONE-IODINE 10 % EX SOLN
Freq: Once | CUTANEOUS | Status: DC
  Filled 2019-01-03: qty 118

## 2019-01-03 MED ORDER — LOSARTAN POTASSIUM 50 MG PO TABS
50.0000 mg | ORAL_TABLET | Freq: Every evening | ORAL | Status: DC
  Administered 2019-01-03 – 2019-01-04 (×2): 50 mg via ORAL
  Filled 2019-01-03 (×2): qty 1

## 2019-01-03 MED ORDER — MAGNESIUM OXIDE 400 (241.3 MG) MG PO TABS
200.0000 mg | ORAL_TABLET | Freq: Every day | ORAL | Status: DC
  Administered 2019-01-03 – 2019-01-06 (×4): 200 mg via ORAL
  Filled 2019-01-03 (×4): qty 1

## 2019-01-03 MED ORDER — KETOROLAC TROMETHAMINE 15 MG/ML IJ SOLN
15.0000 mg | Freq: Once | INTRAMUSCULAR | Status: AC
  Administered 2019-01-03: 15 mg via INTRAVENOUS

## 2019-01-03 MED ORDER — DOCUSATE SODIUM 100 MG PO CAPS
100.0000 mg | ORAL_CAPSULE | Freq: Two times a day (BID) | ORAL | Status: DC
  Administered 2019-01-04 – 2019-01-07 (×6): 100 mg via ORAL
  Filled 2019-01-03 (×8): qty 1

## 2019-01-03 MED ORDER — VITAMIN C 500 MG PO TABS
500.0000 mg | ORAL_TABLET | Freq: Every day | ORAL | Status: DC
  Administered 2019-01-04 – 2019-01-07 (×4): 500 mg via ORAL
  Filled 2019-01-03 (×4): qty 1

## 2019-01-03 MED ORDER — OXYCODONE-ACETAMINOPHEN 5-325 MG PO TABS: 1.0000 | ORAL_TABLET | ORAL | 0 refills | Status: DC | PRN

## 2019-01-03 MED ORDER — ADULT MULTIVITAMIN W/MINERALS CH
1.0000 | ORAL_TABLET | Freq: Every day | ORAL | Status: DC
  Administered 2019-01-04 – 2019-01-07 (×4): 1 via ORAL
  Filled 2019-01-03 (×4): qty 1

## 2019-01-03 MED ORDER — HYDROMORPHONE HCL 1 MG/ML IJ SOLN
INTRAMUSCULAR | Status: AC
  Administered 2019-01-03: 0.5 mg via INTRAVENOUS
  Filled 2019-01-03: qty 2

## 2019-01-03 MED ORDER — ATENOLOL 50 MG PO TABS
50.0000 mg | ORAL_TABLET | Freq: Every day | ORAL | Status: DC
  Administered 2019-01-04 – 2019-01-07 (×4): 50 mg via ORAL
  Filled 2019-01-03 (×4): qty 1

## 2019-01-03 MED ORDER — BIOTIN 10 MG PO CAPS: 10.0000 mg | ORAL_CAPSULE | Freq: Every day | ORAL | Status: DC

## 2019-01-03 MED ORDER — KETOROLAC TROMETHAMINE 15 MG/ML IJ SOLN
INTRAMUSCULAR | Status: AC
  Filled 2019-01-03: qty 1

## 2019-01-03 MED ORDER — FERROUS SULFATE 325 (65 FE) MG PO TABS
325.0000 mg | ORAL_TABLET | Freq: Three times a day (TID) | ORAL | Status: DC
  Administered 2019-01-03 – 2019-01-07 (×11): 325 mg via ORAL
  Filled 2019-01-03 (×11): qty 1

## 2019-01-03 MED ORDER — PROMETHAZINE HCL 25 MG/ML IJ SOLN: 6.2500 mg | INTRAMUSCULAR | Status: DC | PRN

## 2019-01-03 MED ORDER — DIAZEPAM 5 MG PO TABS
2.5000 mg | ORAL_TABLET | Freq: Every day | ORAL | Status: DC | PRN
  Administered 2019-01-04: 5 mg via ORAL
  Administered 2019-01-04 – 2019-01-06 (×2): 2.5 mg via ORAL
  Filled 2019-01-03 (×3): qty 1

## 2019-01-03 MED ORDER — MIDAZOLAM HCL 2 MG/2ML IJ SOLN
INTRAMUSCULAR | Status: AC
  Filled 2019-01-03: qty 2

## 2019-01-03 MED ORDER — CHLORHEXIDINE GLUCONATE 4 % EX LIQD: 60.0000 mL | Freq: Once | CUTANEOUS | Status: DC

## 2019-01-03 MED ORDER — ATORVASTATIN CALCIUM 40 MG PO TABS
40.0000 mg | ORAL_TABLET | Freq: Every day | ORAL | Status: DC
  Administered 2019-01-03 – 2019-01-06 (×4): 40 mg via ORAL
  Filled 2019-01-03 (×4): qty 1

## 2019-01-03 MED ORDER — DIPHENHYDRAMINE HCL 25 MG PO TABS: 25.0000 mg | ORAL_TABLET | Freq: Every evening | ORAL | Status: DC

## 2019-01-03 MED ORDER — ONDANSETRON HCL 4 MG PO TABS: 4.0000 mg | ORAL_TABLET | Freq: Four times a day (QID) | ORAL | Status: DC | PRN

## 2019-01-03 MED ORDER — BENZONATATE 100 MG PO CAPS: 100.0000 mg | ORAL_CAPSULE | Freq: Every evening | ORAL | Status: DC | PRN

## 2019-01-03 MED ORDER — PHENYLEPHRINE 40 MCG/ML (10ML) SYRINGE FOR IV PUSH (FOR BLOOD PRESSURE SUPPORT)
PREFILLED_SYRINGE | INTRAVENOUS | Status: DC | PRN
  Administered 2019-01-03: 40 ug via INTRAVENOUS

## 2019-01-03 MED ORDER — HYDROMORPHONE HCL 1 MG/ML IJ SOLN
0.2500 mg | INTRAMUSCULAR | Status: DC | PRN
  Administered 2019-01-03 (×4): 0.5 mg via INTRAVENOUS

## 2019-01-03 MED ORDER — ONDANSETRON HCL 4 MG/2ML IJ SOLN
INTRAMUSCULAR | Status: AC
  Filled 2019-01-03: qty 2

## 2019-01-03 MED ORDER — DEXAMETHASONE SODIUM PHOSPHATE 10 MG/ML IJ SOLN
INTRAMUSCULAR | Status: AC
  Filled 2019-01-03: qty 1

## 2019-01-03 MED ORDER — HYDROCHLOROTHIAZIDE 25 MG PO TABS
25.0000 mg | ORAL_TABLET | Freq: Every day | ORAL | Status: DC
  Administered 2019-01-03 – 2019-01-07 (×5): 25 mg via ORAL
  Filled 2019-01-03 (×5): qty 1

## 2019-01-03 MED ORDER — PROPOFOL 10 MG/ML IV BOLUS
INTRAVENOUS | Status: DC | PRN
  Administered 2019-01-03: 120 mg via INTRAVENOUS

## 2019-01-03 MED ORDER — PANTOPRAZOLE SODIUM 40 MG PO TBEC
40.0000 mg | DELAYED_RELEASE_TABLET | Freq: Two times a day (BID) | ORAL | Status: DC
  Administered 2019-01-03 – 2019-01-07 (×8): 40 mg via ORAL
  Filled 2019-01-03 (×8): qty 1

## 2019-01-03 MED ORDER — PROPOFOL 10 MG/ML IV BOLUS
INTRAVENOUS | Status: AC
  Filled 2019-01-03: qty 20

## 2019-01-03 MED ORDER — ASPIRIN 81 MG PO CHEW
81.0000 mg | CHEWABLE_TABLET | Freq: Two times a day (BID) | ORAL | Status: DC
  Administered 2019-01-03 – 2019-01-07 (×8): 81 mg via ORAL
  Filled 2019-01-03 (×8): qty 1

## 2019-01-03 MED ORDER — TRANEXAMIC ACID-NACL 1000-0.7 MG/100ML-% IV SOLN
1000.0000 mg | INTRAVENOUS | Status: AC
  Administered 2019-01-03: 1000 mg via INTRAVENOUS
  Filled 2019-01-03: qty 100

## 2019-01-03 MED ORDER — ACETAMINOPHEN 325 MG PO TABS
325.0000 mg | ORAL_TABLET | Freq: Four times a day (QID) | ORAL | Status: DC | PRN
  Administered 2019-01-04 (×2): 650 mg via ORAL
  Filled 2019-01-03 (×3): qty 2

## 2019-01-03 MED ORDER — ONDANSETRON HCL 4 MG/2ML IJ SOLN
INTRAMUSCULAR | Status: DC | PRN
  Administered 2019-01-03: 4 mg via INTRAVENOUS

## 2019-01-03 MED ORDER — METOCLOPRAMIDE HCL 5 MG PO TABS: 5.0000 mg | ORAL_TABLET | Freq: Three times a day (TID) | ORAL | Status: DC | PRN

## 2019-01-03 MED ORDER — DEXAMETHASONE SODIUM PHOSPHATE 10 MG/ML IJ SOLN
INTRAMUSCULAR | Status: DC | PRN
  Administered 2019-01-03: 10 mg via INTRAVENOUS

## 2019-01-03 MED ORDER — ROPIVACAINE HCL 7.5 MG/ML IJ SOLN
INTRAMUSCULAR | Status: DC | PRN
  Administered 2019-01-03 (×4): 5 mL via PERINEURAL

## 2019-01-03 MED ORDER — AMLODIPINE BESYLATE 5 MG PO TABS
5.0000 mg | ORAL_TABLET | Freq: Every day | ORAL | Status: DC
  Administered 2019-01-04 – 2019-01-05 (×2): 5 mg via ORAL
  Filled 2019-01-03 (×2): qty 1

## 2019-01-03 MED ORDER — MEPERIDINE HCL 50 MG/ML IJ SOLN: 6.2500 mg | INTRAMUSCULAR | Status: DC | PRN

## 2019-01-03 MED ORDER — GLUCOSAMINE CHONDROITIN TRIPLE PO TABS: 2.0000 | ORAL_TABLET | Freq: Every day | ORAL | Status: DC

## 2019-01-03 MED ORDER — CLOPIDOGREL BISULFATE 75 MG PO TABS
75.0000 mg | ORAL_TABLET | Freq: Every day | ORAL | Status: DC
  Administered 2019-01-04 – 2019-01-07 (×4): 75 mg via ORAL
  Filled 2019-01-03 (×4): qty 1

## 2019-01-03 MED ORDER — OXYCODONE HCL 5 MG PO TABS
5.0000 mg | ORAL_TABLET | ORAL | Status: DC | PRN
  Administered 2019-01-03 – 2019-01-07 (×10): 5 mg via ORAL
  Filled 2019-01-03 (×11): qty 1

## 2019-01-03 MED ORDER — LIDOCAINE 2% (20 MG/ML) 5 ML SYRINGE
INTRAMUSCULAR | Status: DC | PRN
  Administered 2019-01-03: 80 mg via INTRAVENOUS

## 2019-01-03 MED ORDER — PHENOL 1.4 % MT LIQD
1.0000 | OROMUCOSAL | Status: DC | PRN
  Filled 2019-01-03: qty 177

## 2019-01-03 MED ORDER — MIDAZOLAM HCL 5 MG/5ML IJ SOLN
INTRAMUSCULAR | Status: DC | PRN
  Administered 2019-01-03: 0.5 mg via INTRAVENOUS

## 2019-01-03 MED ORDER — TRANEXAMIC ACID-NACL 1000-0.7 MG/100ML-% IV SOLN
1000.0000 mg | Freq: Once | INTRAVENOUS | Status: AC
  Administered 2019-01-03: 1000 mg via INTRAVENOUS
  Filled 2019-01-03: qty 100

## 2019-01-03 MED ORDER — MAGNESIUM 250 MG PO TABS: 250.0000 mg | ORAL_TABLET | Freq: Every day | ORAL | Status: DC

## 2019-01-03 MED ORDER — HYDROMORPHONE HCL 1 MG/ML IJ SOLN
0.5000 mg | INTRAMUSCULAR | Status: DC | PRN
  Administered 2019-01-04 (×2): 1 mg via INTRAVENOUS
  Filled 2019-01-03 (×2): qty 1

## 2019-01-03 MED ORDER — ASPIRIN 325 MG PO TABS: 650.0000 mg | ORAL_TABLET | Freq: Every day | ORAL | Status: DC | PRN

## 2019-01-03 SURGICAL SUPPLY — 51 items
BAG ZIPLOCK 12X15 (MISCELLANEOUS) ×1 IMPLANT
BLADE SAG 18X100X1.27 (BLADE) ×2 IMPLANT
BLADE SAW SGTL 13X75X1.27 (BLADE) ×2 IMPLANT
BNDG ELASTIC 6X10 VLCR STRL LF (GAUZE/BANDAGES/DRESSINGS) ×2 IMPLANT
BNDG GAUZE ELAST 4 BULKY (GAUZE/BANDAGES/DRESSINGS) ×2 IMPLANT
BOWL SMART MIX CTS (DISPOSABLE) ×2 IMPLANT
CEMENT HV SMART SET (Cement) ×4 IMPLANT
CEMENT TIBIA MBT SIZE 4 (Knees) IMPLANT
COVER SURGICAL LIGHT HANDLE (MISCELLANEOUS) ×2 IMPLANT
COVER WAND RF STERILE (DRAPES) IMPLANT
CUFF TOURN SGL QUICK 34 (TOURNIQUET CUFF) ×1
CUFF TRNQT CYL 34X4.125X (TOURNIQUET CUFF) ×1 IMPLANT
DRAPE SHEET LG 3/4 BI-LAMINATE (DRAPES) ×2 IMPLANT
DRAPE U-SHAPE 47X51 STRL (DRAPES) ×2 IMPLANT
DRSG ADAPTIC 3X8 NADH LF (GAUZE/BANDAGES/DRESSINGS) ×2 IMPLANT
DRSG PAD ABDOMINAL 8X10 ST (GAUZE/BANDAGES/DRESSINGS) ×2 IMPLANT
DURAPREP 26ML APPLICATOR (WOUND CARE) ×2 IMPLANT
ELECT REM PT RETURN 15FT ADLT (MISCELLANEOUS) ×2 IMPLANT
FEMUR SIGMA PS KNEE SZ 4.0N L (Femur) ×1 IMPLANT
GAUZE SPONGE 4X4 12PLY STRL (GAUZE/BANDAGES/DRESSINGS) ×2 IMPLANT
GLOVE BIOGEL PI ORTHO PRO 7.5 (GLOVE) ×1
GLOVE BIOGEL PI ORTHO PRO SZ8 (GLOVE) ×1
GLOVE ORTHO TXT STRL SZ7.5 (GLOVE) ×2 IMPLANT
GLOVE PI ORTHO PRO STRL 7.5 (GLOVE) ×1 IMPLANT
GLOVE PI ORTHO PRO STRL SZ8 (GLOVE) ×1 IMPLANT
GLOVE SURG ORTHO 8.5 STRL (GLOVE) ×4 IMPLANT
GOWN STRL REIN XL XLG (GOWN DISPOSABLE) ×4 IMPLANT
HANDPIECE INTERPULSE COAX TIP (DISPOSABLE) ×1
HOLDER FOLEY CATH W/STRAP (MISCELLANEOUS) IMPLANT
IMMOBILIZER KNEE 20 (SOFTGOODS) ×2
IMMOBILIZER KNEE 20 THIGH 36 (SOFTGOODS) IMPLANT
KIT TURNOVER KIT A (KITS) IMPLANT
MANIFOLD NEPTUNE II (INSTRUMENTS) ×2 IMPLANT
PACK TOTAL KNEE CUSTOM (KITS) ×2 IMPLANT
PATELLA DOME PFC 35MM (Knees) ×1 IMPLANT
PIN STEINMAN FIXATION KNEE (PIN) ×1 IMPLANT
PLATE ROT INSERT 10MM SIZE 4 (Plate) ×1 IMPLANT
PROTECTOR NERVE ULNAR (MISCELLANEOUS) ×2 IMPLANT
SET HNDPC FAN SPRY TIP SCT (DISPOSABLE) ×1 IMPLANT
STAPLER VISISTAT 35W (STAPLE) IMPLANT
STRIP CLOSURE SKIN 1/2X4 (GAUZE/BANDAGES/DRESSINGS) ×3 IMPLANT
SUT MNCRL AB 3-0 PS2 18 (SUTURE) ×2 IMPLANT
SUT VIC AB 0 CT1 36 (SUTURE) ×2 IMPLANT
SUT VIC AB 1 CT1 36 (SUTURE) ×6 IMPLANT
SUT VIC AB 2-0 CT1 27 (SUTURE) ×1
SUT VIC AB 2-0 CT1 TAPERPNT 27 (SUTURE) ×1 IMPLANT
TIBIA MBT CEMENT SIZE 4 (Knees) ×2 IMPLANT
TRAY FOLEY MTR SLVR 16FR STAT (SET/KITS/TRAYS/PACK) ×2 IMPLANT
WATER STERILE IRR 1000ML POUR (IV SOLUTION) ×4 IMPLANT
WRAP KNEE MAXI GEL POST OP (GAUZE/BANDAGES/DRESSINGS) ×1 IMPLANT
YANKAUER SUCT BULB TIP 10FT TU (MISCELLANEOUS) ×2 IMPLANT

## 2019-01-03 NOTE — Discharge Instructions (Signed)
Ice to the kneeconstantly.  Keep the incision covered and clean and dry for one week, then ok to get it wet in the shower.  Do exercise as instructed several times per day. Do NOT prop anything under the knee.  Prop under the heel or the ankle to allow the knee to rest out straight.  Use the knee immobilizer (brace) while sleeping to maintain knee extension at night.  It is ok to put your full weight on the left leg. It is ok to sit and dangle your knee at a 90 degree angle  Use walker while you are up and around for balance and support. Ask for assistance getting up for the first two weeks or so. NO FALLS!  Take baby aspirin 81 mg chewable twice daily for 30 days to prevent blood clots.  Wear knee high stockings 24/7 for 30 days   Follow up with Dr Veverly Fells in two weeks in the office, call [phone] for appt

## 2019-01-03 NOTE — Evaluation (Signed)
Physical Therapy Evaluation Patient Details Name: Nathan Santiago MRN: [id number] DOB: [birth year] Today's Date: 01/03/2019   History of Present Illness  85 yo male s/p L TKR on 01/03/19. PMH includes AAA, CAD with cardiac cath, gait abnormality, HTN, HLD, PAD.   Clinical Impression  Pt presents with mild L knee pain, decreased L knee ROM, L knee weakness post-surgery, difficulty performing all mobility tasks, and decreased activity tolerance due to drowsiness and dizziness. Pt to benefit from acute PT to address deficits. Pt ambulated 15 ft with RW with min assist for steadying, verbal cuing for safety and form provided throughout sesion. Pt with posterior leaning and L knee instability in standing, L KI utilized due to this. Pt reported dizziness with mobility, BP sitting EOB 189/70 with pulse of 98 bpm. RN notified. Pt educated on ankle pumps (20/hour) to perform this afternoon/evening to increase circulation, to pt's tolerance and limited by pain. PT to progress mobility as tolerated, and will continue to follow acutely.        Follow Up Recommendations Follow surgeon's recommendation for DC plan and follow-up therapies;Supervision for mobility/OOB    Equipment Recommendations  Rolling walker with 5" wheels    Recommendations for Other Services       Precautions / Restrictions Precautions Precautions: Fall Required Braces or Orthoses: Knee Immobilizer - Left Knee Immobilizer - Left: Other (comment)(per orders, on when in bed when not in CPM) Restrictions Weight Bearing Restrictions: No Other Position/Activity Restrictions: WBAT      Mobility  Bed Mobility Overal bed mobility: Needs Assistance Bed Mobility: Supine to Sit     Supine to sit: Min assist;HOB elevated     General bed mobility comments: min assist for LLE management, scooting to EOB. Increased time and effort to perform.   Transfers Overall transfer level: Needs assistance Equipment used: Rolling walker (2  wheeled) Transfers: Sit to/from Stand Sit to Stand: Min assist;From elevated surface         General transfer comment: Min assist for steadying, power up initially from bed. Verbal cuing for hand placement. upon standing, pt with heavy posterior leaning and near LOB, verbal cuing to correct posterior leaning. Pt with LLE buckling in standing, KI applied.   Ambulation/Gait Ambulation/Gait assistance: Min assist;+2 safety/equipment Gait Distance (Feet): 15 Feet Assistive device: Rolling walker (2 wheeled) Gait Pattern/deviations: Leaning posteriorly;Decreased step length - right;Decreased step length - left;Step-to pattern;Shuffle Gait velocity: decr    General Gait Details: Min assist for steadying, multiple VC for placement in RW, sequencing, upright posture as pt with tendency for posterior leaning in standing. Pt did not notice posterior leaning until pointed out by PT, required min assist to correct.   Stairs            Wheelchair Mobility    Modified Rankin (Stroke Patients Only)       Balance Overall balance assessment: Needs assistance Sitting-balance support: No upper extremity supported;Feet supported Sitting balance-Leahy Scale: Fair   Postural control: Posterior lean Standing balance support: Bilateral upper extremity supported Standing balance-Leahy Scale: Poor Standing balance comment: reliant on RW and PT for steadying                              Pertinent Vitals/Pain Pain Assessment: 0-10 Pain Score: 2  Pain Location: L knee Pain Descriptors / Indicators: Sore Pain Intervention(s): Monitored during session;Repositioned;Limited activity within patient's tolerance;Ice applied;Premedicated before session    Home Living Family/patient expects to be  discharged to:: Private residence Living Arrangements: Spouse/significant other Available Help at Discharge: Family;Available 24 hours/day Type of Home: Independent living facility Home Access:  Level entry     Home Layout: One level Home Equipment: Cane - single point;Grab bars - toilet;Grab bars - tub/shower      Prior Function Level of Independence: Independent with assistive device(s)         Comments: Pt reports doing everything for self PTA, reports using cane for "balance more than anything"     Hand Dominance   Dominant Hand: Left    Extremity/Trunk Assessment   Upper Extremity Assessment Upper Extremity Assessment: Generalized weakness    Lower Extremity Assessment Lower Extremity Assessment: Generalized weakness;LLE deficits/detail LLE Deficits / Details: suspected post-surgical weakness; able to perform ankle pumps, quad set lacking 5* knee extension, heel slide to 60*, and SLR without lift assist/quad lag LLE Sensation: WNL    Cervical / Trunk Assessment Cervical / Trunk Assessment: Normal  Communication   Communication: No difficulties  Cognition Arousal/Alertness: Lethargic;Suspect due to medications Behavior During Therapy: [facility] for tasks assessed/performed Overall Cognitive Status: No family/caregiver present to determine baseline cognitive functioning                                 General Comments: Pt with intermittent closing, RN states pt has been falling asleep off and on since being admitted to floor. When asking pt about home, pt states "I can't even remember (what type of home)", pt answers "yes" to ILF. Pt lacking awareness of posterior leaning in standing      General Comments      Exercises     Assessment/Plan    PT Assessment Patient needs continued PT services  PT Problem List Decreased strength;Decreased mobility;Decreased safety awareness;Decreased range of motion;Decreased activity tolerance;Decreased balance;Decreased knowledge of use of DME;Pain;Decreased cognition       PT Treatment Interventions DME instruction;Functional mobility training;Balance training;Patient/family education;Gait  training;Therapeutic activities;Therapeutic exercise    PT Goals (Current goals can be found in the Care Plan section)  Acute Rehab PT Goals Patient Stated Goal: go home to wife PT Goal Formulation: With patient Time For Goal Achievement: 01/10/19 Potential to Achieve Goals: Good    Frequency 7X/week   Barriers to discharge        Co-evaluation               AM-PAC PT "6 Clicks" Mobility  Outcome Measure Help needed turning from your back to your side while in a flat bed without using bedrails?: A Little Help needed moving from lying on your back to sitting on the side of a flat bed without using bedrails?: A Little Help needed moving to and from a bed to a chair (including a wheelchair)?: A Little Help needed standing up from a chair using your arms (e.g., wheelchair or bedside chair)?: A Little Help needed to walk in hospital room?: A Little Help needed climbing 3-5 steps with a railing? : A Lot 6 Click Score: 17    End of Session Equipment Utilized During Treatment: Gait belt;Oxygen;Left knee immobilizer(L KI utilized due to L knee buckling) Activity Tolerance: Patient limited by fatigue;Patient limited by pain Patient left: in chair;with call bell/phone within reach;with nursing/sitter in room;with SCD's reapplied(RN notified - no posey alarm box in room. RN to find) Nurse Communication: Mobility status PT Visit Diagnosis: Other abnormalities of gait and mobility (R26.89);Difficulty in walking, not elsewhere classified (R26.2)  Time: 9373-4287 PT Time Calculation (min) (ACUTE ONLY): 30 min   Charges:   PT Evaluation $PT Eval Low Complexity: 1 Low PT Treatments $Gait Training: 8-22 mins       Julien Girt, PT Acute Rehabilitation Services Pager [phone]  Office [phone]   Eathan Groman D Elonda Husky 01/03/2019, 3:22 PM

## 2019-01-03 NOTE — Op Note (Signed)
NAME: Nathan Santiago, NO MEDICAL RECORD IF:[id number] ACCOUNT [id number] DATE OF BIRTH:15-Mar-1931 FACILITY: [facility] LOCATION: [facility] PHYSICIAN:STEVEN Orlena Sheldon, MD  OPERATIVE REPORT  DATE OF PROCEDURE:  01/03/2019  PREOPERATIVE DIAGNOSIS:  Left knee end-stage arthritis.  POSTOPERATIVE DIAGNOSIS:  Left knee end-stage arthritis.  PROCEDURE PERFORMED:  Left total knee arthroplasty using DePuy Sigma rotating platform prosthesis.  ATTENDING SURGEON:  Esmond Plants, MD  ASSISTANT:  Darol Destine, [place], who was scrubbed during the entire procedure and necessary for satisfactory completion of surgery.  ANESTHESIA:  General anesthesia was used plus an adductor canal block.  ESTIMATED BLOOD LOSS:  Minimal.  FLUID REPLACEMENT:  1200 mL crystalloid.  INSTRUMENT COUNTS:  Correct.  COMPLICATIONS:  No complications.  ANTIBIOTICS:  Perioperative antibiotics were given.  TOURNIQUET TIME:  1 hour and 10 minutes at 300 mmHg.  INDICATIONS:  The patient is an 85 year old male with worsening right knee pain interfering with his ability to walk and his quality of life.  The patient has rest pain and pain with walking less than a block.  The patient has failed all measures of  conservative management.  Desires operative treatment to restore his function and eliminate pain in the left knee.  Informed consent obtained.  DESCRIPTION OF PROCEDURE:  After an adequate level of anesthesia was achieved, the patient was positioned in the supine position.  Left leg correctly identified.  Nonsterile tourniquet placed proximal thigh.  Left leg sterilely prepped and draped in the  usual manner.  Time-out called, verifying correct patient, correct site.  We elevated the leg, exsanguinated with an Esmarch bandage, and then placed the knee in flexion.  We performed a longitudinal midline incision with a 10-blade scalpel with  dissection down through subcutaneous tissues to the peripatellar tissues.  We performed a  medial parapatellar arthrotomy with a fresh 10-blade scalpel.  We everted the patella, dividing the lateral patellofemoral ligaments.  We entered the distal femur  with a step-cut drill and placed our intramedullary resection guide for the distal femur cut.  We performed a distal femoral cut with an oscillating saw set on 10 mm of resection, 5 degrees valgus for the left knee.  At this point, we sized the femur  anterior down to a size 4 for anterior, posterior and chamfer cuts.  We then went ahead and subluxed the tibia anteriorly, resected ACL, PCL, meniscal tissues.  We then performed a perpendicular cut to the tibia 90 degrees to the long axis of the tibia  with minimal posterior slope with an oscillating saw.  We set the resection on 4 mm off the affected medial side.  Once we had our tibial cut performed, we removed excess osteophytes off the posterior femur.  We then sized our flexion and extension gaps  at 10 mm, which were symmetric.  We then completed our tibial preparation with the modular drill and keel punch.  We then directed our attention towards the femur and used the box cut guide to cut the box for the posterior cruciate substituting femoral  prosthesis.  Once we had our box cut done, we placed our trial femur in place.  We initially did the full regular and then went ahead and did the 4 narrow, which fit the distal femur better in terms of medial lateral width.  Once we had our trial  components on, we selected a 10 spacer and placed that on the tibia and then subluxed the tibia onto the femur with a nice little pop.  We had nice  full extension and stability in flexion.  At this point, we resurfaced the patella and started with 25 mm  thickness and resected down to 17 mm.  With the patellar cutting guide, we next went ahead and drilled our lug holes for a size 35 patellar button.  We then placed the 35 button on and then ranged the knee.  We had excellent patellar tracking with  no-touch  technique.  We removed all trial components, pulse irrigated, dried the bone well and used vacuum mixing of high viscosity cement and cemented the components into place.  We placed a 10 mm spacer and then placed the knee in extension for  compression of the cement.  Once the cement was hardened, we removed excess cement with a 1/4-inch curved osteotome.  We then went ahead and retrialed and made sure that the 10 was the proper size.  Once we were happy with that, we removed the trial  components, selected the real size 4, 10 poly and placed that on the tibia and subluxed the tibia into the femur.  Again, a nice little pop as it seated.  We were able to get full extension with a nice alignment and stable in flexion.  Patellar tracking  was perfect.  We irrigated thoroughly and then closed the parapatellar arthrotomy with #1 Vicryl suture followed by 2-0 Vicryl for subcutaneous closure and 4-0 Monocryl for skin.  Steri-Strips applied followed by a sterile dressing.  The patient  tolerated surgery well.  LN/NUANCE  D:01/03/2019 T:01/03/2019 JOB:[id number]

## 2019-01-03 NOTE — Interval H&P Note (Signed)
History and Physical Interval Note:  01/03/2019 7:33 AM  Nathan Santiago  has presented today for surgery, with the diagnosis of Left knee end stage osteoarthritis.  The various methods of treatment have been discussed with the patient and family. After consideration of risks, benefits and other options for treatment, the patient has consented to  Procedure(s) with comments: TOTAL KNEE ARTHROPLASTY (Left) - with IS block as a surgical intervention.  The patient's history has been reviewed, patient examined, no change in status, stable for surgery.  I have reviewed the patient's chart and labs.  Questions were answered to the patient's satisfaction.     [name]

## 2019-01-03 NOTE — Anesthesia Postprocedure Evaluation (Signed)
Anesthesia Post Note  Patient: Nathan Santiago [facility]  Procedure(s) Performed: TOTAL KNEE ARTHROPLASTY (Left Knee)     Patient location during evaluation: PACU Anesthesia Type: General Level of consciousness: sedated Pain management: pain level controlled Vital Signs Assessment: post-procedure vital signs reviewed and stable Respiratory status: spontaneous breathing Cardiovascular status: stable Postop Assessment: no apparent nausea or vomiting Anesthetic complications: no    Last Vitals:  Vitals:   01/03/19 1115 01/03/19 1152  BP: (!) 131/50 (!) 166/57  Pulse: 83 91  Resp: 14 (!) 8  Temp:  36.4 C  SpO2: 93% 94%    Last Pain:  Vitals:   01/03/19 1152  TempSrc: Oral  PainSc:    Pain Goal:    LLE Motor Response: Purposeful movement (01/03/19 1155)              Huston Foley

## 2019-01-03 NOTE — Anesthesia Procedure Notes (Signed)
Procedure Name: LMA Insertion Date/Time: 01/03/2019 7:43 AM Performed by: [place] Dariann Huckaba C, CRNA Pre-anesthesia Checklist: Patient identified, Emergency Drugs available, Suction available and Patient being monitored Patient Re-evaluated:Patient Re-evaluated prior to induction Oxygen Delivery Method: Circle system utilized Preoxygenation: Pre-oxygenation with 100% oxygen Induction Type: IV induction Ventilation: Mask ventilation without difficulty LMA: LMA inserted LMA Size: 4.0 Number of attempts: 1 Airway Equipment and Method: Bite block Placement Confirmation: positive ETCO2 Tube secured with: Tape Dental Injury: Teeth and Oropharynx as per pre-operative assessment

## 2019-01-03 NOTE — Anesthesia Procedure Notes (Signed)
Anesthesia Regional Block: Adductor canal block   Pre-Anesthetic Checklist: ,, timeout performed, Correct Patient, Correct Site, Correct Laterality, Correct Procedure, Correct Position, site marked, Risks and benefits discussed,  Surgical consent,  Pre-op evaluation,  At surgeon's request and post-op pain management  Laterality: Lower and Left  Prep: chloraprep       Needles:  Injection technique: Single-shot  Needle Type: Echogenic Stimulator Needle     Needle Length: 10cm  Needle Gauge: 21   Needle insertion depth: 2 cm   Additional Needles:   Procedures:,,,, ultrasound used (permanent image in chart),,,,  Narrative:  Start time: 01/03/2019 6:55 AM End time: 01/03/2019 7:05 AM Injection made incrementally with aspirations every 5 mL. Anesthesiologist: Lyn Hollingshead, MD

## 2019-01-03 NOTE — Brief Op Note (Signed)
01/03/2019  9:18 AM  PATIENT:  Nathan Santiago  85 y.o. male  PRE-OPERATIVE DIAGNOSIS:  Left knee end stage osteoarthritis  POST-OPERATIVE DIAGNOSIS:  Left knee end stage osteoarthritis  PROCEDURE:  Procedure(s) with comments: TOTAL KNEE ARTHROPLASTY (Left) - with IS block DePuy Sigma RP  SURGEON:  Surgeon(s) and Role:    Netta Cedars, MD - Primary  PHYSICIAN ASSISTANT:   ASSISTANTS: [place] Bruns, PA-C   ANESTHESIA:   regional and general  EBL:  75 mL   BLOOD ADMINISTERED:none  DRAINS: none   LOCAL MEDICATIONS USED:  NONE  SPECIMEN:  No Specimen  DISPOSITION OF SPECIMEN:  N/A  COUNTS:  YES  TOURNIQUET:  * Missing tourniquet times found for documented tourniquets in log: [id number] *  DICTATION: .Other Dictation: Dictation Number [id number]  PLAN OF CARE: Admit to inpatient   PATIENT DISPOSITION:  PACU - hemodynamically stable.   Delay start of Pharmacological VTE agent (>24hrs) due to surgical blood loss or risk of bleeding: no

## 2019-01-03 NOTE — Transfer of Care (Signed)
Immediate Anesthesia Transfer of Care Note  Patient: Nathan Santiago [facility]  Procedure(s) Performed: TOTAL KNEE ARTHROPLASTY (Left Knee)  Patient Location: PACU  Anesthesia Type:General  Level of Consciousness: awake, alert  and oriented  Airway & Oxygen Therapy: Patient Spontanous Breathing and Patient connected to face mask oxygen  Post-op Assessment: Report given to RN and Post -op Vital signs reviewed and stable  Post vital signs: Reviewed and stable  Last Vitals:  Vitals Value Taken Time  BP 163/69 01/03/2019  9:45 AM  Temp    Pulse 95 01/03/2019  9:45 AM  Resp 14 01/03/2019  9:45 AM  SpO2 96 % 01/03/2019  9:45 AM  Vitals shown include unvalidated device data.  Last Pain:  Vitals:   01/03/19 0555  TempSrc: Oral         Complications: No apparent anesthesia complications

## 2019-01-04 LAB — BASIC METABOLIC PANEL
Anion gap: 4 — ABNORMAL LOW (ref 5–15)
BUN: 20 mg/dL (ref 8–23)
CO2: 27 mmol/L (ref 22–32)
Calcium: 8.6 mg/dL — ABNORMAL LOW (ref 8.9–10.3)
Chloride: 101 mmol/L (ref 98–111)
Creatinine, Ser: 0.68 mg/dL (ref 0.61–1.24)
GFR calc Af Amer: >60 mL/min (ref >60)
GFR calc non Af Amer: >60 mL/min (ref >60)
Glucose, Bld: 135 mg/dL — ABNORMAL HIGH (ref 70–99)
Potassium: 3.4 mmol/L — ABNORMAL LOW (ref 3.5–5.1)
Sodium: 132 mmol/L — ABNORMAL LOW (ref 135–145)

## 2019-01-04 LAB — CBC
HCT: 33.5 % — ABNORMAL LOW (ref 39.0–52.0)
Hemoglobin: 10.6 g/dL — ABNORMAL LOW (ref 13.0–17.0)
MCH: 27 pg (ref 26.0–34.0)
MCHC: 31.6 g/dL (ref 30.0–36.0)
MCV: 85.2 fL (ref 80.0–100.0)
Platelets: 223 10*3/uL (ref 150–400)
RBC: 3.93 MIL/uL — ABNORMAL LOW (ref 4.22–5.81)
RDW: 15.9 % — ABNORMAL HIGH (ref 11.5–15.5)
WBC: 14.6 10*3/uL — ABNORMAL HIGH (ref 4.0–10.5)
nRBC: 0 % (ref 0.0–0.2)

## 2019-01-04 NOTE — Progress Notes (Signed)
  Home health agencies that serve [phone].        Home Health Agencies Search Results  Results List Table  Home Health Agency Information Quality of Patient Care Rating Patient Survey Summary Rating  [facility] [phone] 4 out of 5 stars 4 out of 5 stars  [facility] [phone] 3 out of 5 stars 5 out of 5 stars  [facility] [phone] 3 out of 5 stars 4 out of 5 stars  [facility] [phone] 4  out of 5 stars 3 out of 5 stars  [facility] [phone] 4 out of 5 stars 4 out of 5 stars  [facility] [phone] 4  out of 5 stars 4 out of 5 stars  [facility] [phone] 4 out of 5 stars 4 out of 5 stars  [facility] [phone] 4 out of 5 stars 4 out of 5 stars  [name] [phone] 3  out of 5 stars 4 out of 5 stars  [facility] [phone] 3 out of 5 stars 4 out of 5 stars  [facility] [phone] 4 out of 5 stars Not Available12  [facility] [phone] 3  out of 5 stars 3 out of 5 stars  [facility] [phone] 3  out of 5 stars 4 out of 5 stars  [facility] [phone] 3  out of 5 stars 4 out of 5 stars  [facility] [phone] 3  out of 5 stars 3 out of 5 stars  [facility] [phone] 3  out of 5 stars Not Available11  [facility] [phone] 3  out of 5 stars 4 out of 5 stars  [facility] [phone] 4  out of 5 stars 3 out of 5 stars  [facility] [phone] 4  out of 5 stars 2 out of 5 stars   Home Health Footnotes  Footnote number Footnote as displayed on Home Health Compare  1 This agency provides services under a federal waiver program to non-traditional, chronic long term population.  2 This agency provides services to a  special needs population.  3 Not Available.  4 The number of patient episodes for this measure is too small to report.  5 This measure currently does not have data or provider has been certified/recertified for less than 6 months.  6 The national average for this measure is not provided because of state-to-state differences in data collection.  7 Medicare is not displaying rates for this measure for any home health agency, because of an issue with the data.  8 There were problems with the data and they are being corrected.  9 Zero, or very few, patients met the survey's rules for inclusion. The scores shown, if any, reflect a very small number of surveys and may not accurately tell how an agency is doing.  10 Survey results are based on less than 12 months of data.  11 Fewer than 70 patients completed the survey. Use the scores shown, if any, with caution as the number of surveys may be too low to accurately tell how an agency is doing.  12 No survey results are available for this period.  13 Data suppressed by CMS   for one or more quarters.

## 2019-01-04 NOTE — Progress Notes (Signed)
Orthopedics Progress Note  Subjective: Patient is feeling well this morning. He is planning on going to rehab at [place]  Objective:  Vitals:   01/04/19 0530 01/04/19 0946  BP: (!) 171/70 (!) 148/73  Pulse: 90 84  Resp: 16 16  Temp: 98 F (36.7 C) 98.2 F (36.8 C)  SpO2: 98% 90%    General: Awake and alert  Musculoskeletal: left knee dressing intact, no swelling and no drainage No pain with quad set and no pain with ankle pumps Neurovascularly intact  Lab Results  Component Value Date   WBC 14.6 (H) 01/04/2019   HGB 10.6 (L) 01/04/2019   HCT 33.5 (L) 01/04/2019   MCV 85.2 01/04/2019   PLT 223 01/04/2019       Component Value Date/Time   NA 132 (L) 01/04/2019 0331   NA 142 07/16/2018 1208   K 3.4 (L) 01/04/2019 0331   CL 101 01/04/2019 0331   CO2 27 01/04/2019 0331   GLUCOSE 135 (H) 01/04/2019 0331   BUN 20 01/04/2019 0331   BUN 21 07/16/2018 1208   CREATININE 0.68 01/04/2019 0331   CREATININE 0.79 04/12/2016 1353   CALCIUM 8.6 (L) 01/04/2019 0331   GFRNONAA >60 01/04/2019 0331   GFRAA >60 01/04/2019 0331    Lab Results  Component Value Date   INR 1.06 11/22/2016   INR 1.0 04/12/2016   INR 0.93 10/28/2010    Assessment/Plan: POD #1 s/p Procedure(s): TOTAL KNEE ARTHROPLASTY Pt, OT   OOD/mobilization DVT prophylaxis - mechanical and ASA  Doran Heater. Veverly Fells, MD 01/04/2019 10:51 AM

## 2019-01-04 NOTE — Plan of Care (Signed)
Patient sitting up in bed this morning; states pain well controlled unless he lifts leg or moves leg. Anticipating working with therapy this morning. Will continue to monitor.

## 2019-01-04 NOTE — NC FL2 (Signed)
[place] LEVEL OF CARE SCREENING TOOL     IDENTIFICATION  Patient Name: Nathan Santiago Birthdate: 07-18-1931 Sex: male Admission Date (Current Location): 01/03/2019  [facility] and [place] Number:  Herbalist and Address:  [facility],  [place], [place]      Provider Number: [id number]  Attending Physician Name and Address:  Netta Cedars, MD  Relative Name and Phone Number:  Dominick Morella, [phone]    Current Level of Care: Hospital Recommended Level of Care: [place] Prior Approval Number:    Date Approved/Denied:   PASRR Number:    Discharge Plan: SNF([facility])    Current Diagnoses: Patient Active Problem List   Diagnosis Date Noted  . Status post total knee replacement, left 01/03/2019  . Pain in left knee 08/05/2018  . Pain in right knee 08/05/2018  . Chronic cough 04/15/2018  . Foreign body in ear 12/21/2017  . Hematuria 10/10/2017  . Gait abnormality 02/13/2017  . UTI (urinary tract infection) 11/22/2016  . Vertigo 11/22/2016  . Hypokalemia 11/22/2016  . Urinary frequency 09/08/2016  . Claudication ([place]) 04/24/2016  . Sensorineural hearing loss (SNHL), bilateral 03/09/2016  . 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  . Bilateral carotid artery disease ([place]) 08/07/2014  . Peripheral arterial disease ([place]) 08/06/2013  . GASTROPARESIS 02/25/2009  . COLONIC POLYPS, ADENOMATOUS, HX OF 02/22/2009  . HYPERCHOLESTEROLEMIA 09/26/2007  . Essential hypertension 09/26/2007  . Coronary atherosclerosis 09/26/2007  . Allergic rhinitis 09/26/2007  . GERD 09/26/2007  . Peptic ulcer 09/26/2007  . UMBILICAL HERNIA 76/73/4193  . NEPHROLITHIASIS 09/26/2007  . BLADDER STONE 09/26/2007    Orientation RESPIRATION BLADDER Height & Weight     Self, Time, Situation, Place  O2(2L) Continent, External catheter Weight: 168 lb 6.9 oz (76.4 kg) Height:  5\' 4"  (162.6 cm)  BEHAVIORAL  SYMPTOMS/MOOD NEUROLOGICAL BOWEL NUTRITION STATUS      Continent Diet(regular)  AMBULATORY STATUS COMMUNICATION OF NEEDS Skin   Limited Assist Verbally Normal                       Personal Care Assistance Level of Assistance  Bathing, Feeding, Dressing Bathing Assistance: Limited assistance Feeding assistance: Independent Dressing Assistance: Limited assistance     Functional Limitations Info  Sight, Hearing, Speech Sight Info: Impaired(wears glasses) Hearing Info: Adequate Speech Info: Adequate    SPECIAL CARE FACTORS FREQUENCY  PT (By licensed PT), OT (By licensed OT)     PT Frequency: 5x wk OT Frequency: 5x wk            Contractures Contractures Info: Not present    Additional Factors Info  Code Status, Allergies Code Status Info: Full Code Allergies Info: LISINOPRIL, NIASPAN NIACIN ER            Current Medications (01/04/2019):  This is the current hospital active medication list Current Facility-Administered Medications  Medication Dose Route Frequency Provider Last Rate Last Dose  . 0.9 %  sodium chloride infusion   Intravenous Continuous Netta Cedars, MD 50 mL/hr at 01/04/19 [phone]    . acetaminophen (TYLENOL) tablet 325-650 mg  325-650 mg Oral Q6H PRN Netta Cedars, MD      . amLODipine (NORVASC) tablet 5 mg  5 mg Oral Daily Netta Cedars, MD   5 mg at 01/04/19 1028  . aspirin chewable tablet 81 mg  81 mg Oral BID Netta Cedars, MD   81 mg at 01/04/19 1028  . atenolol (TENORMIN) tablet 50  mg  50 mg Oral Daily Netta Cedars, MD   50 mg at 01/04/19 1028  . atorvastatin (LIPITOR) tablet 40 mg  40 mg Oral q1800 Netta Cedars, MD   40 mg at 01/03/19 1706  . benzonatate (TESSALON) capsule 100 mg  100 mg Oral QHS PRN Netta Cedars, MD      . bisacodyl (DULCOLAX) suppository 10 mg  10 mg Rectal Daily PRN Netta Cedars, MD      . bismuth subsalicylate (PEPTO BISMOL) 262 MG/15ML suspension 30 mL  30 mL Oral Q6H PRN Netta Cedars, MD      . cholecalciferol  (VITAMIN D3) tablet 1,000 Units  1,000 Units Oral Daily Netta Cedars, MD   1,000 Units at 01/04/19 1028  . clopidogrel (PLAVIX) tablet 75 mg  75 mg Oral Daily Netta Cedars, MD   75 mg at 01/04/19 1028  . diazepam (VALIUM) tablet 2.5-5 mg  2.5-5 mg Oral Daily PRN Netta Cedars, MD   5 mg at 01/04/19 0233  . docusate sodium (COLACE) capsule 100 mg  100 mg Oral BID Netta Cedars, MD   100 mg at 01/04/19 1030  . ferrous sulfate tablet 325 mg  325 mg Oral TID Yolonda Kida, MD   325 mg at 01/04/19 1029  . hydrochlorothiazide (HYDRODIURIL) tablet 25 mg  25 mg Oral Daily Netta Cedars, MD   25 mg at 01/04/19 1030  . HYDROmorphone (DILAUDID) injection 0.5-1 mg  0.5-1 mg Intravenous Q4H PRN Netta Cedars, MD   1 mg at 01/04/19 0757  . losartan (COZAAR) tablet 50 mg  50 mg Oral QPM Netta Cedars, MD   50 mg at 01/03/19 1706  . magnesium oxide (MAG-OX) tablet 200 mg  200 mg Oral QHS Lenis Noon, RPH   200 mg at 01/03/19 2111  . menthol-cetylpyridinium (CEPACOL) lozenge 3 mg  1 lozenge Oral PRN Netta Cedars, MD       Or  . phenol [facility]) mouth spray 1 spray  1 spray Mouth/Throat PRN Netta Cedars, MD      . metoCLOPramide (REGLAN) tablet 5-10 mg  5-10 mg Oral Q8H PRN Netta Cedars, MD       Or  . metoCLOPramide (REGLAN) injection 5-10 mg  5-10 mg Intravenous Q8H PRN Netta Cedars, MD      . multivitamin with minerals tablet 1 tablet  1 tablet Oral Daily Netta Cedars, MD   1 tablet at 01/04/19 1029  . ondansetron (ZOFRAN) tablet 4 mg  4 mg Oral Q6H PRN Netta Cedars, MD       Or  . ondansetron [facility]) injection 4 mg  4 mg Intravenous Q6H PRN Netta Cedars, MD      . oxyCODONE (Oxy IR/ROXICODONE) immediate release tablet 5-10 mg  5-10 mg Oral Q4H PRN Netta Cedars, MD   5 mg at 01/03/19 2110  . pantoprazole (PROTONIX) EC tablet 40 mg  40 mg Oral BID Netta Cedars, MD   40 mg at 01/04/19 1030  . polyethylene glycol (MIRALAX / GLYCOLAX) packet 17 g  17 g Oral Daily PRN Netta Cedars, MD       . potassium chloride SA (K-DUR) CR tablet 20 mEq  20 mEq Oral QPM Netta Cedars, MD   20 mEq at 01/03/19 1706  . vitamin C (ASCORBIC ACID) tablet 500 mg  500 mg Oral Daily Netta Cedars, MD   500 mg at 01/04/19 1028     Discharge Medications: Please see discharge summary for a list of discharge medications.  Relevant Imaging  Results:  Relevant Lab Results:   Additional Information SS# [id number]  Wende Neighbors, LCSW

## 2019-01-04 NOTE — Progress Notes (Signed)
Physical Therapy Treatment Patient Details Name: Nathan Santiago MRN: [id number] DOB: 1931-07-24 Today's Date: 01/04/2019    History of Present Illness 85 yo male s/p L TKR on 01/03/19. PMH includes AAA, CAD with cardiac cath, gait abnormality, HTN, HLD, PAD.     PT Comments    Pt continues cooperative but requiring increased assistance for all tasks and with noted deterioration in balance with ambulation.  Pt ambulated limited distance in room and assisted back to bed.   Follow Up Recommendations  Follow surgeon's recommendation for DC plan and follow-up therapies;Supervision for mobility/OOB     Equipment Recommendations  Rolling walker with 5" wheels    Recommendations for Other Services       Precautions / Restrictions Precautions Precautions: Fall Required Braces or Orthoses: Knee Immobilizer - Left Knee Immobilizer - Left: Other (comment) Restrictions Weight Bearing Restrictions: No Other Position/Activity Restrictions: WBAT    Mobility  Bed Mobility Overal bed mobility: Needs Assistance Bed Mobility: Sit to Supine     Supine to sit: Min assist Sit to supine: Mod assist   General bed mobility comments: cues for sequence and physical assist for bil LEs  Transfers Overall transfer level: Needs assistance Equipment used: Rolling walker (2 wheeled) Transfers: Sit to/from Stand Sit to Stand: Min assist;Mod assist;+2 physical assistance;+2 safety/equipment         General transfer comment: cues for LE management and use of UEs to self assist  Ambulation/Gait Ambulation/Gait assistance: Min assist;+2 safety/equipment Gait Distance (Feet): 5 Feet Assistive device: Rolling walker (2 wheeled) Gait Pattern/deviations: Leaning posteriorly;Decreased step length - right;Decreased step length - left;Step-to pattern;Shuffle;Trunk flexed Gait velocity: decr    General Gait Details: cues for sequence and position from RW.  Pt very unsteady - states it was his IV pain  meds   Stairs             Wheelchair Mobility    Modified Rankin (Stroke Patients Only)       Balance Overall balance assessment: Needs assistance Sitting-balance support: No upper extremity supported;Feet supported Sitting balance-Leahy Scale: Fair   Postural control: Posterior lean Standing balance support: Bilateral upper extremity supported Standing balance-Leahy Scale: Poor Standing balance comment: reliant on RW and PT for steadying                             Cognition Arousal/Alertness: Awake/alert Behavior During Therapy: WFL for tasks assessed/performed Overall Cognitive Status: Within Functional Limits for tasks assessed                                        Exercises Total Joint Exercises Ankle Circles/Pumps: AROM;Both;20 reps;Supine Quad Sets: AROM;Both;10 reps;Supine Heel Slides: AAROM;Left;15 reps;Supine Straight Leg Raises: AAROM;AROM;Left;10 reps;Supine    General Comments        Pertinent Vitals/Pain Pain Assessment: 0-10 Pain Score: 4  Pain Location: L knee Pain Descriptors / Indicators: Sore Pain Intervention(s): Limited activity within patient's tolerance;Monitored during session;Premedicated before session;Ice applied    Home Living                      Prior Function            PT Goals (current goals can now be found in the care plan section) Acute Rehab PT Goals Patient Stated Goal: go home to wife PT Goal Formulation: With patient  Time For Goal Achievement: 01/10/19 Potential to Achieve Goals: Good Progress towards PT goals: Not progressing toward goals - comment(Pt feels "drunk"  after IV pain meds)    Frequency    7X/week      PT Plan Current plan remains appropriate    Co-evaluation              AM-PAC PT "6 Clicks" Mobility   Outcome Measure  Help needed turning from your back to your side while in a flat bed without using bedrails?: A Little Help needed moving  from lying on your back to sitting on the side of a flat bed without using bedrails?: A Little Help needed moving to and from a bed to a chair (including a wheelchair)?: A Lot Help needed standing up from a chair using your arms (e.g., wheelchair or bedside chair)?: A Lot Help needed to walk in hospital room?: A Lot Help needed climbing 3-5 steps with a railing? : A Lot 6 Click Score: 14    End of Session Equipment Utilized During Treatment: Gait belt;Oxygen Activity Tolerance: Patient tolerated treatment well;Patient limited by fatigue;Patient limited by pain Patient left: in bed;with bed alarm set;with call bell/phone within reach Nurse Communication: Mobility status PT Visit Diagnosis: Other abnormalities of gait and mobility (R26.89);Difficulty in walking, not elsewhere classified (R26.2)     Time: 9935-7017 PT Time Calculation (min) (ACUTE ONLY): 14 min  Charges:  $Gait Training: 8-22 mins $Therapeutic Exercise: 8-22 mins $Therapeutic Activity: 8-22 mins                     Debe Coder PT Acute Rehabilitation Services Pager [phone] Office [phone]    Nathan Santiago 01/04/2019, 3:12 PM

## 2019-01-04 NOTE — Progress Notes (Signed)
Physical Therapy Treatment Patient Details Name: Nathan Santiago MRN: [id number] DOB: [birth year] Today's Date: 01/04/2019    History of Present Illness 85 yo male s/p L TKR on 01/03/19. PMH includes AAA, CAD with cardiac cath, gait abnormality, HTN, HLD, PAD.     PT Comments    Pt more alert and with increased activity tolerance this am but with noted continued ambulatory balance deficits limiting progress.   Follow Up Recommendations  Follow surgeon's recommendation for DC plan and follow-up therapies;Supervision for mobility/OOB     Equipment Recommendations  Rolling walker with 5" wheels    Recommendations for Other Services       Precautions / Restrictions Precautions Precautions: Fall Required Braces or Orthoses: Knee Immobilizer - Left Knee Immobilizer - Left: Other (comment) Restrictions Weight Bearing Restrictions: No Other Position/Activity Restrictions: WBAT    Mobility  Bed Mobility Overal bed mobility: Needs Assistance Bed Mobility: Supine to Sit     Supine to sit: Min assist     General bed mobility comments: min assist for LLE management, scooting to EOB. Increased time and effort to perform.   Transfers Overall transfer level: Needs assistance Equipment used: Rolling walker (2 wheeled) Transfers: Sit to/from Stand Sit to Stand: Min assist;Mod assist         General transfer comment: cues for LE management and use of UEs to self assist  Ambulation/Gait Ambulation/Gait assistance: Min assist;+2 safety/equipment Gait Distance (Feet): 45 Feet Assistive device: Rolling walker (2 wheeled) Gait Pattern/deviations: Leaning posteriorly;Decreased step length - right;Decreased step length - left;Step-to pattern;Shuffle;Trunk flexed Gait velocity: decr    General Gait Details: Min assist for steadying, multiple VC for placement in RW, sequencing, upright posture as pt with tendency for posterior leaning in standing.    Stairs              Wheelchair Mobility    Modified Rankin (Stroke Patients Only)       Balance Overall balance assessment: Needs assistance Sitting-balance support: No upper extremity supported;Feet supported Sitting balance-Leahy Scale: Fair   Postural control: Posterior lean Standing balance support: Bilateral upper extremity supported Standing balance-Leahy Scale: Poor Standing balance comment: reliant on RW and PT for steadying                             Cognition Arousal/Alertness: Awake/alert Behavior During Therapy: WFL for tasks assessed/performed Overall Cognitive Status: Within Functional Limits for tasks assessed                                        Exercises Total Joint Exercises Ankle Circles/Pumps: AROM;Both;20 reps;Supine Quad Sets: AROM;Both;10 reps;Supine Heel Slides: AAROM;Left;15 reps;Supine Straight Leg Raises: AAROM;AROM;Left;10 reps;Supine    General Comments        Pertinent Vitals/Pain Pain Assessment: 0-10 Pain Score: 4  Pain Location: L knee Pain Descriptors / Indicators: Sore Pain Intervention(s): Limited activity within patient's tolerance;Monitored during session;Premedicated before session;Ice applied    Home Living                      Prior Function            PT Goals (current goals can now be found in the care plan section) Acute Rehab PT Goals Patient Stated Goal: go home to wife PT Goal Formulation: With patient Time For Goal Achievement: 01/10/19 Potential to  Achieve Goals: Good Progress towards PT goals: Progressing toward goals    Frequency    7X/week      PT Plan Current plan remains appropriate    Co-evaluation              AM-PAC PT "6 Clicks" Mobility   Outcome Measure  Help needed turning from your back to your side while in a flat bed without using bedrails?: A Little Help needed moving from lying on your back to sitting on the side of a flat bed without using  bedrails?: A Little Help needed moving to and from a bed to a chair (including a wheelchair)?: A Little Help needed standing up from a chair using your arms (e.g., wheelchair or bedside chair)?: A Little Help needed to walk in hospital room?: A Little Help needed climbing 3-5 steps with a railing? : A Lot 6 Click Score: 17    End of Session Equipment Utilized During Treatment: Gait belt;Oxygen Activity Tolerance: Patient tolerated treatment well;Patient limited by fatigue;Patient limited by pain Patient left: in chair;with call bell/phone within reach;with chair alarm set;with nursing/sitter in room Nurse Communication: Mobility status PT Visit Diagnosis: Other abnormalities of gait and mobility (R26.89);Difficulty in walking, not elsewhere classified (R26.2)     Time: 0820-0902 PT Time Calculation (min) (ACUTE ONLY): 42 min  Charges:  $Gait Training: 8-22 mins $Therapeutic Exercise: 8-22 mins $Therapeutic Activity: 8-22 mins                     Debe Coder PT Acute Rehabilitation Services Pager [phone] Office [phone]    Roberts Bon 01/04/2019, 12:48 PM

## 2019-01-04 NOTE — TOC Initial Note (Signed)
Transition of Care [facility]) - Initial/Assessment Note    Patient Details  Name: Nathan Santiago MRN: [id number] Date of Birth: 12-Mar-1931  Transition of Care [facility]) CM/SW Contact:    Joaquin Courts, RN Phone Number: 01/04/2019, 12:46 PM  Clinical Narrative:    CM spoke to patient at bedside. Patient reports wife, [name], is available to assist him at home. Declines 3-in-1. Patient selected and was set up with [facility] for HHPT, [employer] to deliver rolling walker.   Expected Discharge Plan: [place] Barriers to Discharge: Continued Medical Work up   Patient Goals and CMS Choice Patient states their goals for this hospitalization and ongoing recovery are:: to go home  CMS Medicare.gov Compare Post Acute Care list provided to:: Patient Choice offered to / list presented to : Patient  Expected Discharge Plan and Services Expected Discharge Plan: [place]   Discharge Planning Services: CM Consult Post Acute Care Choice: Home Health, Durable Medical Equipment Living arrangements for the past 2 months: Apartment                 DME Arranged: Walker rolling DME Agency: [employer] Date DME Agency Contacted: 01/04/19 Time DME Agency Contacted: [id number] Representative spoke with at DME Agency: [place]: PT [place]: [facility] (formerly [employer]) Date [place]: 01/04/19 Time [place]: 1245 Representative spoke with at [place]: Graciella Freer  Prior Living Arrangements/Services Living arrangements for the past 2 months: Apartment Lives with:: Spouse Patient language and need for interpreter reviewed:: Yes Do you feel safe going back to the place where you live?: Yes      Need for Family Participation in Patient Care: Yes (Comment) Care giver support system in place?: Yes (comment)   Criminal Activity/Legal Involvement Pertinent to Current Situation/Hospitalization: No - Comment as  needed  Activities of Daily Living Home Assistive Devices/Equipment: Dentures (specify type), Cane (specify quad or straight) ADL Screening (condition at time of admission) Patient's cognitive ability adequate to safely complete daily activities?: Yes Is the patient deaf or have difficulty hearing?: Yes(bi lat hearing aids) Does the patient have difficulty seeing, even when wearing glasses/contacts?: No Does the patient have difficulty concentrating, remembering, or making decisions?: No Patient able to express need for assistance with ADLs?: Yes Does the patient have difficulty dressing or bathing?: No Independently performs ADLs?: Yes (appropriate for developmental age) Does the patient have difficulty walking or climbing stairs?: No Weakness of Legs: None Weakness of Arms/Hands: None  Permission Sought/Granted Permission sought to share information with : Family Supports Permission granted to share information with : Yes, Verbal Permission Granted  Share Information with NAME: [name]     Permission granted to share info w Relationship: spouse     Emotional Assessment Appearance:: Appears stated age Attitude/Demeanor/Rapport: Engaged Affect (typically observed): Accepting Orientation: : Oriented to Self, Oriented to Place, Oriented to  Time, Oriented to Situation   Psych Involvement: No (comment)  Admission diagnosis:  Left knee end stage osteoarthritis Patient Active Problem List   Diagnosis Date Noted  . Status post total knee replacement, left 01/03/2019  . Pain in left knee 08/05/2018  . Pain in right knee 08/05/2018  . Chronic cough 04/15/2018  . Foreign body in ear 12/21/2017  . Hematuria 10/10/2017  . Gait abnormality 02/13/2017  . UTI (urinary tract infection) 11/22/2016  . Vertigo 11/22/2016  . Hypokalemia 11/22/2016  . Urinary frequency 09/08/2016  . Claudication ([place]) 04/24/2016  .  Sensorineural hearing loss (SNHL), bilateral 03/09/2016  . Temporomandibular  jaw dysfunction 03/09/2016  . Bilateral carotid artery disease ([place]) 08/07/2014  . Peripheral arterial disease ([place]) 08/06/2013  . GASTROPARESIS 02/25/2009  . COLONIC POLYPS, ADENOMATOUS, HX OF 02/22/2009  . HYPERCHOLESTEROLEMIA 09/26/2007  . Essential hypertension 09/26/2007  . Coronary atherosclerosis 09/26/2007  . Allergic rhinitis 09/26/2007  . GERD 09/26/2007  . Peptic ulcer 09/26/2007  . UMBILICAL HERNIA 37/00/5259  . NEPHROLITHIASIS 09/26/2007  . BLADDER STONE 09/26/2007   PCP:  [name], MD Pharmacy:   [id number] - [place], [place]. [place] [address] Phone: [phone] Fax: [phone]  [place], [place] [facility] [address] [place] [address] Phone: [phone] Fax: [phone]     Social Determinants of Health (SDOH) Interventions    Readmission Risk Interventions No flowsheet data found.

## 2019-01-05 DIAGNOSIS — Z96652 Presence of left artificial knee joint: Secondary | ICD-10-CM

## 2019-01-05 DIAGNOSIS — I2510 Atherosclerotic heart disease of native coronary artery without angina pectoris: Secondary | ICD-10-CM

## 2019-01-05 DIAGNOSIS — I10 Essential (primary) hypertension: Secondary | ICD-10-CM

## 2019-01-05 LAB — CBC
HCT: 35.3 % — ABNORMAL LOW (ref 39.0–52.0)
Hemoglobin: 10.7 g/dL — ABNORMAL LOW (ref 13.0–17.0)
MCH: 25.8 pg — ABNORMAL LOW (ref 26.0–34.0)
MCHC: 30.3 g/dL (ref 30.0–36.0)
MCV: 85.3 fL (ref 80.0–100.0)
Platelets: 205 10*3/uL (ref 150–400)
RBC: 4.14 MIL/uL — ABNORMAL LOW (ref 4.22–5.81)
RDW: 16.1 % — ABNORMAL HIGH (ref 11.5–15.5)
WBC: 13.3 10*3/uL — ABNORMAL HIGH (ref 4.0–10.5)
nRBC: 0 % (ref 0.0–0.2)

## 2019-01-05 MED ORDER — HYDRALAZINE HCL 20 MG/ML IJ SOLN
10.0000 mg | Freq: Four times a day (QID) | INTRAMUSCULAR | Status: DC | PRN
  Administered 2019-01-06 (×3): 10 mg via INTRAVENOUS
  Filled 2019-01-05 (×3): qty 1

## 2019-01-05 MED ORDER — TIZANIDINE HCL 4 MG PO TABS
4.0000 mg | ORAL_TABLET | Freq: Four times a day (QID) | ORAL | Status: DC | PRN
  Administered 2019-01-05 – 2019-01-06 (×3): 4 mg via ORAL
  Filled 2019-01-05 (×3): qty 1

## 2019-01-05 MED ORDER — LOSARTAN POTASSIUM 50 MG PO TABS
100.0000 mg | ORAL_TABLET | Freq: Every evening | ORAL | Status: DC
  Administered 2019-01-05 – 2019-01-06 (×2): 100 mg via ORAL
  Filled 2019-01-05 (×2): qty 2

## 2019-01-05 MED ORDER — BISACODYL 5 MG PO TBEC
5.0000 mg | DELAYED_RELEASE_TABLET | Freq: Once | ORAL | Status: AC
  Administered 2019-01-05: 5 mg via ORAL
  Filled 2019-01-05: qty 1

## 2019-01-05 MED ORDER — AMLODIPINE BESYLATE 5 MG PO TABS
5.0000 mg | ORAL_TABLET | Freq: Once | ORAL | Status: AC
  Administered 2019-01-05: 5 mg via ORAL
  Filled 2019-01-05: qty 1

## 2019-01-05 MED ORDER — AMLODIPINE BESYLATE 10 MG PO TABS: 10.0000 mg | ORAL_TABLET | Freq: Every day | ORAL | Status: DC

## 2019-01-05 MED ORDER — LABETALOL HCL 5 MG/ML IV SOLN
20.0000 mg | Freq: Once | INTRAVENOUS | Status: AC
  Administered 2019-01-05: 20 mg via INTRAVENOUS
  Filled 2019-01-05: qty 4

## 2019-01-05 MED ORDER — AMLODIPINE BESYLATE 10 MG PO TABS
10.0000 mg | ORAL_TABLET | Freq: Every day | ORAL | Status: DC
  Administered 2019-01-06 – 2019-01-07 (×2): 10 mg via ORAL
  Filled 2019-01-05 (×2): qty 1

## 2019-01-05 NOTE — Progress Notes (Signed)
PT Cancellation Note  Patient Details Name: Nathan Santiago MRN: [id number] DOB: [birth year]   Cancelled Treatment:     PT attempted x 2 this am but deferred 2* increased pain and elevated BP (systolic 830+).  Will follow.   Nathan Santiago 01/05/2019, 12:27 PM

## 2019-01-05 NOTE — Plan of Care (Signed)
Patient lying in bed this morning. Endorsing severe pain in left knee, left hip, and back. Requesting pain medication. Will continue to monitor.

## 2019-01-05 NOTE — Progress Notes (Signed)
Patient BP elevated 197/81 this morning (8343), attempt to call patients attending office to notify on multiple attempts, no answer and could not leave voice mail. Patient is calm, asymptomatic and has no complaints of pain at this time. Writer gave patient day time BP medications. Oncoming shift nurse made aware of medications given and patients BP.  Norlene Duel RN, BSN

## 2019-01-05 NOTE — Progress Notes (Signed)
Physical Therapy Treatment Patient Details Name: Nathan Santiago MRN: [id number] DOB: December 29, 1930 Today's Date: 01/05/2019    History of Present Illness 85 yo male s/p L TKR on 01/03/19. PMH includes AAA, CAD with cardiac cath, gait abnormality, HTN, HLD, PAD.     PT Comments    Pt cooperative and with some improvement over session late yesterday but pt continues limited by pain/fatigue and with significant balance deficits and ltd endurance.   Follow Up Recommendations  Follow surgeon's recommendation for DC plan and follow-up therapies;Supervision for mobility/OOB     Equipment Recommendations  Rolling walker with 5" wheels    Recommendations for Other Services       Precautions / Restrictions Precautions Precautions: Fall Required Braces or Orthoses: Knee Immobilizer - Left Restrictions Weight Bearing Restrictions: No Other Position/Activity Restrictions: WBAT    Mobility  Bed Mobility Overal bed mobility: Needs Assistance Bed Mobility: Supine to Sit     Supine to sit: Mod assist     General bed mobility comments: cues for sequence and physical assist for bil LEs, to bring trunk to upright and with use of pad to complete transition to EOB sitting  Transfers Overall transfer level: Needs assistance Equipment used: Rolling walker (2 wheeled) Transfers: Sit to/from Stand Sit to Stand: Min assist;Mod assist;+2 physical assistance;+2 safety/equipment         General transfer comment: cues for LE management and use of UEs to self assist; physical assist to bring wt up and fwd and to correct for posterior drift in standing  Ambulation/Gait Ambulation/Gait assistance: Min assist;+2 safety/equipment Gait Distance (Feet): 22 Feet Assistive device: Rolling walker (2 wheeled) Gait Pattern/deviations: Leaning posteriorly;Decreased step length - right;Decreased step length - left;Step-to pattern;Shuffle;Trunk flexed Gait velocity: decr    General Gait Details: cues for  sequence and position from RW.  Physical assist to balance, support and RW management   Stairs             Wheelchair Mobility    Modified Rankin (Stroke Patients Only)       Balance Overall balance assessment: Needs assistance Sitting-balance support: No upper extremity supported;Feet supported Sitting balance-Leahy Scale: Fair   Postural control: Posterior lean Standing balance support: Bilateral upper extremity supported Standing balance-Leahy Scale: Poor Standing balance comment: reliant on RW and PT for steadying                             Cognition Arousal/Alertness: Awake/alert Behavior During Therapy: WFL for tasks assessed/performed Overall Cognitive Status: Within Functional Limits for tasks assessed                                        Exercises Total Joint Exercises Ankle Circles/Pumps: AROM;Both;20 reps;Supine Quad Sets: AROM;Both;10 reps;Supine Heel Slides: AAROM;Left;15 reps;Supine Straight Leg Raises: AAROM;AROM;Left;10 reps;Supine    General Comments        Pertinent Vitals/Pain Pain Assessment: 0-10 Pain Score: 6  Pain Location: L knee Pain Descriptors / Indicators: Sore Pain Intervention(s): Limited activity within patient's tolerance;Monitored during session;Premedicated before session;Ice applied    Home Living                      Prior Function            PT Goals (current goals can now be found in the care plan section) Acute Rehab  PT Goals Patient Stated Goal: go home to wife PT Goal Formulation: With patient Time For Goal Achievement: 01/10/19 Potential to Achieve Goals: Good Progress towards PT goals: Progressing toward goals    Frequency    7X/week      PT Plan Current plan remains appropriate    Co-evaluation              AM-PAC PT "6 Clicks" Mobility   Outcome Measure  Help needed turning from your back to your side while in a flat bed without using bedrails?:  A Little Help needed moving from lying on your back to sitting on the side of a flat bed without using bedrails?: A Lot Help needed moving to and from a bed to a chair (including a wheelchair)?: A Lot Help needed standing up from a chair using your arms (e.g., wheelchair or bedside chair)?: A Lot Help needed to walk in hospital room?: A Lot Help needed climbing 3-5 steps with a railing? : A Lot 6 Click Score: 13    End of Session Equipment Utilized During Treatment: Gait belt;Left knee immobilizer Activity Tolerance: Patient tolerated treatment well;Patient limited by fatigue;Patient limited by pain Patient left: in chair;with call bell/phone within reach;with chair alarm set Nurse Communication: Mobility status PT Visit Diagnosis: Other abnormalities of gait and mobility (R26.89);Difficulty in walking, not elsewhere classified (R26.2)     Time: 1323-1350 PT Time Calculation (min) (ACUTE ONLY): 27 min  Charges:  $Gait Training: 8-22 mins $Therapeutic Exercise: 8-22 mins                     Debe Coder PT Acute Rehabilitation Services Pager [phone] Office [phone]    Nathan Santiago 01/05/2019, 4:50 PM

## 2019-01-05 NOTE — Progress Notes (Addendum)
   Subjective: 2 Days Post-Op Procedure(s) (LRB): TOTAL KNEE ARTHROPLASTY (Left) Patient reports pain as moderate.   Patient seen in rounds for Dr. Veverly Fells.  Patient is well, and has had no acute complaints or problems other than pain in the left knee. Denies SOB and chest pain. Voiding well. Reports flatus. States desire to go home.    Objective: Vital signs in last 24 hours: Temp:  [97.8 F (36.6 C)-98.8 F (37.1 C)] 98.8 F (37.1 C) (06/07 0618) Pulse Rate:  [69-87] 78 (06/07 0700) Resp:  [16-18] 16 (06/07 0618) BP: (148-205)/(61-82) 194/82 (06/07 0700) SpO2:  [90 %-96 %] 96 % (06/07 0618)  Intake/Output from previous day:  Intake/Output Summary (Last 24 hours) at 01/05/2019 0903 Last data filed at 01/05/2019 0749 Gross per 24 hour  Intake 1795.65 ml  Output 1400 ml  Net 395.65 ml    Intake/Output this shift: Total I/O In: -  Out: 200 [Urine:200]  Labs: Recent Labs    01/04/19 0331 01/05/19 0350  HGB 10.6* 10.7*   Recent Labs    01/04/19 0331 01/05/19 0350  WBC 14.6* 13.3*  RBC 3.93* 4.14*  HCT 33.5* 35.3*  PLT 223 205   Recent Labs    01/04/19 0331  NA 132*  K 3.4*  CL 101  CO2 27  BUN 20  CREATININE 0.68  GLUCOSE 135*  CALCIUM 8.6*    EXAM General - Patient is Alert and Oriented Extremity - Neurologically intact Intact pulses distally Dorsiflexion/Plantar flexion intact No cellulitis present Compartment soft Dressing/Incision - clean, dry, no drainage Motor Function - intact, moving foot and toes well on exam.   Past Medical History:  Diagnosis Date  . AAA (abdominal aortic aneurysm) (HCC)    asymptomatic  . AAA (abdominal aortic aneurysm) ([place]) 2010   peripheral  angiogram-- bilateral SFA DISEASE  and 60 to 70% infrarenaal abd. aortic stenosis with 15 -mm gradient  . Adenomatous colon polyp 11/2003  . CAD (coronary artery disease)   . Gait abnormality 02/13/2017  . Gastroparesis    pt denies  . GERD (gastroesophageal reflux disease)     w/ LPR  . History of kidney stones    x2  . Hyperlipidemia   . Hypertension   . Peripheral arterial disease ([place])    RCEA  by Dr Lemar Livings  . PUD (peptic ulcer disease)    pt unaware  . Vertigo     Assessment/Plan: 2 Days Post-Op Procedure(s) (LRB): TOTAL KNEE ARTHROPLASTY (Left) Active Problems:   Status post total knee replacement, left  Estimated body mass index is 28.91 kg/m as calculated from the following:   Height as of this encounter: 5\' 4"  (1.626 m).   Weight as of this encounter: 76.4 kg. Advance diet Up with therapy  DVT Prophylaxis - Plavix per home med Weight-Bearing as tolerated   Work with PT today. Needs to have under better control prior to DC home. Plan for DC home tomorrow with HHPT pending progress. Aquacel dressing change. TED hose placed. Uncontrolled BP. Will get medical consult.   Ardeen Jourdain, PA-C Orthopaedic Surgery 01/05/2019, 9:03 AM

## 2019-01-05 NOTE — Progress Notes (Signed)
Physical Therapy Treatment Patient Details Name: Nathan Santiago MRN: [id number] DOB: 06-Mar-1931 Today's Date: 01/05/2019    History of Present Illness 85 yo male s/p L TKR on 01/03/19. PMH includes AAA, CAD with cardiac cath, gait abnormality, HTN, HLD, PAD.     PT Comments    Pt continues cooperative but not progressing with mobility.  This date pt ltd by increased pain, fatigue and ongoing balance deficits.  Pt also with increased difficulty following cues for LE sequencing and increased UE WB.  At this time, pt is at very high risk of falls and would benefit from follow up rehab at SNF level to maximize IND and safety prior to return home with elderly spouse.  Follow Up Recommendations  SNF;Follow surgeon's recommendation for DC plan and follow-up therapies     Equipment Recommendations  Rolling walker with 5" wheels    Recommendations for Other Services       Precautions / Restrictions Precautions Precautions: Fall Required Braces or Orthoses: Knee Immobilizer - Left Knee Immobilizer - Left: Other (comment) Restrictions Weight Bearing Restrictions: No Other Position/Activity Restrictions: WBAT    Mobility  Bed Mobility Overal bed mobility: Needs Assistance Bed Mobility: Sit to Supine     Supine to sit: Mod assist Sit to supine: Mod assist;+2 for physical assistance;+2 for safety/equipment   General bed mobility comments: cues for sequence and physical assist to manage bil LEs and to control trunk  Transfers Overall transfer level: Needs assistance Equipment used: Rolling walker (2 wheeled) Transfers: Sit to/from Stand Sit to Stand: Min assist;Mod assist;+2 physical assistance;+2 safety/equipment         General transfer comment: cues for LE management and use of UEs to self assist  Ambulation/Gait Ambulation/Gait assistance: Min assist;Mod assist;+2 physical assistance;+2 safety/equipment Gait Distance (Feet): 5 Feet Assistive device: Rolling walker (2  wheeled) Gait Pattern/deviations: Leaning posteriorly;Decreased step length - right;Decreased step length - left;Step-to pattern;Shuffle;Trunk flexed Gait velocity: decr    General Gait Details: cues for sequence and position from RW.  Pt very unsteady and reports increased pain and fatigue.  Pt with difficulty following cues for sequencing and increased UE WB   Stairs             Wheelchair Mobility    Modified Rankin (Stroke Patients Only)       Balance Overall balance assessment: Needs assistance Sitting-balance support: No upper extremity supported;Feet supported Sitting balance-Leahy Scale: Fair   Postural control: Posterior lean Standing balance support: Bilateral upper extremity supported Standing balance-Leahy Scale: Poor Standing balance comment: reliant on RW and PT for steadying                             Cognition Arousal/Alertness: Awake/alert Behavior During Therapy: Impulsive Overall Cognitive Status: Impaired/Different from baseline Area of Impairment: Following commands;Problem solving;Safety/judgement                       Following Commands: Follows one step commands inconsistently;Follows one step commands with increased time Safety/Judgement: Decreased awareness of safety;Decreased awareness of deficits   Problem Solving: Slow processing General Comments: Pt attempting to get out of chair on arrival to room, no RW nearby and pt unsure of why he was trying to get up or where he planned to go.  Pt with increased difficulty following cues for WB on UEs or sequencing LEs      Exercises Total Joint Exercises Ankle Circles/Pumps: AROM;Both;20 reps;Supine Quad  Sets: AROM;Both;10 reps;Supine Heel Slides: AAROM;Left;15 reps;Supine Straight Leg Raises: AAROM;AROM;Left;10 reps;Supine    General Comments        Pertinent Vitals/Pain Pain Assessment: 0-10 Pain Score: 7  Pain Location: L knee Pain Descriptors / Indicators:  Sore;Aching Pain Intervention(s): Limited activity within patient's tolerance;Monitored during session;Premedicated before session;Ice applied    Home Living                      Prior Function            PT Goals (current goals can now be found in the care plan section) Acute Rehab PT Goals Patient Stated Goal: go home to wife PT Goal Formulation: With patient Time For Goal Achievement: 01/10/19 Potential to Achieve Goals: Good Progress towards PT goals: Not progressing toward goals - comment(Pt continues ltd by pain, fatigue and balance deficits)    Frequency    7X/week      PT Plan Discharge plan needs to be updated    Co-evaluation              AM-PAC PT "6 Clicks" Mobility   Outcome Measure  Help needed turning from your back to your side while in a flat bed without using bedrails?: A Little Help needed moving from lying on your back to sitting on the side of a flat bed without using bedrails?: A Lot Help needed moving to and from a bed to a chair (including a wheelchair)?: A Lot Help needed standing up from a chair using your arms (e.g., wheelchair or bedside chair)?: A Lot Help needed to walk in hospital room?: A Lot Help needed climbing 3-5 steps with a railing? : A Lot 6 Click Score: 13    End of Session Equipment Utilized During Treatment: Gait belt Activity Tolerance: Patient limited by fatigue;Patient limited by pain Patient left: in bed;with bed alarm set;with call bell/phone within reach;with nursing/sitter in room Nurse Communication: Mobility status PT Visit Diagnosis: Other abnormalities of gait and mobility (R26.89);Difficulty in walking, not elsewhere classified (R26.2)     Time: 3709-6438 PT Time Calculation (min) (ACUTE ONLY): 21 min  Charges:  $Gait Training: 8-22 mins $Therapeutic Exercise: 8-22 mins                     [place] Pager [phone] Office  [phone]    Cailynn Bodnar 01/05/2019, 5:04 PM

## 2019-01-05 NOTE — TOC Progression Note (Signed)
Transition of Care [facility]) - Progression Note    Patient Details  Name: VERNICE BOWKER MRN: [id number] Date of Birth: [birth year]  Transition of Care [facility]) CM/SW [place], LCSW Phone Number: 01/05/2019, 3:38 PM  Clinical Narrative:  CSW spoke to patient about discharge plans. Patient stated he lives at [place] with his wife. Patient aware that MD recommends he discharge to [facility] SN[facility] for a couple of days. Patient stated he agrees to discharge to [facility] and gave CSW verbal permission to contact them to assist with transfer    Expected Discharge Plan: [place] Barriers to Discharge: Continued Medical Work up  Expected Discharge Plan and Services Expected Discharge Plan: [place]   Discharge Planning Services: CM Consult Post Acute Care Choice: Home Health, Durable Medical Equipment Living arrangements for the past 2 months: Apartment                 DME Arranged: Gilford Rile rolling DME Agency: [employer] Date DME Agency Contacted: 01/04/19 Time DME Agency Contacted: [id number] Representative spoke with at DME Agency: [place]: PT [place]: [facility] [employer] [facility] [employer] Date [place]: 01/04/19 Time [place]: 1245 Representative spoke with at [place]: [place] ([place]) Interventions    Readmission Risk Interventions No flowsheet data found.

## 2019-01-05 NOTE — Consult Note (Signed)
Medical Consultation   Nathan Santiago [facility]  HQI:[id number]  DOB: March 12, 1931  DOA: 01/03/2019  PCP: [name], MD  Requesting physician: Orthopedics, Dr. Veverly Fells  Reason for consultation: Uncontrolled hypertension   History of Present Illness: Patient is 85 year old male with history of hypertension on multiple antihypertensives, hyperlipidemia, coronary artery disease, GERD, gastroparesis who underwent left total knee arthroplasty, postop day #2.  Trial hospitalist service was consulted due to uncontrolled BP readings.  BP this morning 210/73. At the time of my examination, patient is alert and oriented, denies any headache, visual blurring, chest pain, shortness of breath, dizziness.  He does endorse having left knee pain, 8/10, postop and some anxiety. Patient is on multiple antihypertensives, however states that SBP runs in 130s to 140s at home, does not check regularly.  Review of Systems:Positives marked in 'bold'  Constitutional: Denies fever, chills, diaphoresis, appetite change and fatigue.  HEENT: Denies photophobia, eye pain, redness, hearing loss, ear pain, congestion, sore throat, rhinorrhea, sneezing, mouth sores, trouble swallowing, neck pain, neck stiffness and tinnitus.   Respiratory: Denies SOB, DOE, cough, chest tightness,  and wheezing.   Cardiovascular: Denies chest pain, palpitations and leg swelling.  Gastrointestinal: Denies nausea, vomiting, abdominal pain, diarrhea, constipation, blood in stool and abdominal distention.  Genitourinary: Denies dysuria, urgency, frequency, hematuria, flank pain and difficulty urinating.  Musculoskeletal: Please see HPI Skin: Denies pallor, rash and wound.  Neurological: Denies dizziness, seizures, syncope, weakness, light-headedness, numbness and headaches.  Hematological: Denies adenopathy. Easy bruising, personal or family bleeding history  Psychiatric/Behavioral: Denies suicidal ideation, mood changes, confusion, nervousness,  sleep disturbance and agitation   Allergies:   Allergies  Allergen Reactions  . Lisinopril Cough  . Niaspan [Niacin Er] Rash      Past Medical History:  Diagnosis Date  . AAA (abdominal aortic aneurysm) (HCC)    asymptomatic  . AAA (abdominal aortic aneurysm) ([place]) 2010   peripheral  angiogram-- bilateral SFA DISEASE  and 60 to 70% infrarenaal abd. aortic stenosis with 15 -mm gradient  . Adenomatous colon polyp 11/2003  . CAD (coronary artery disease)   . Gait abnormality 02/13/2017  . Gastroparesis    pt denies  . GERD (gastroesophageal reflux disease)    w/ LPR  . History of kidney stones    x2  . Hyperlipidemia   . Hypertension   . Peripheral arterial disease ([place])    RCEA  by Dr Lemar Livings  . PUD (peptic ulcer disease)    pt unaware  . Vertigo     Past Surgical History:  Procedure Laterality Date  . ABDOMINAL AORTOGRAM W/LOWER EXTREMITY Bilateral 08/05/2018   Procedure: ABDOMINAL AORTOGRAM W/LOWER EXTREMITY;  Surgeon: Lorretta Harp, MD;  Location: [facility];  Service: Cardiovascular;  Laterality: Bilateral;  . AORTOGRAM  04/24/2016    Abdominal aortogram, bilateral iliac angiogram, bifemoral runoff  . CARDIAC CATHETERIZATION  05/12/2005   RCA  . carotid doppler  01/24/2013   RICA endarterectomy,left CCA 0-49%; left bulb and prox ICA 50-69%; bilaateral subclavian < 50%  . CAROTID ENDARTERECTOMY  11/01/2010  . CATARACT EXTRACTION    . COLONOSCOPY    . CORONARY ANGIOPLASTY  05/18/2005   2 STENTS distal RCA AND PROXIMAL-MID RCA   5 total stents per pt.  . CYSTOSCOPY/URETEROSCOPY/HOLMIUM LASER/STENT PLACEMENT Left 01/11/2018   Procedure: LEFT URETEROSCOPY/HOLMIUM LASER/STENT PLACEMENT;  Surgeon: Ardis Hughs, MD;  Location: [facility];  Service: Urology;  Laterality: Left;  . DOPPLER ECHOCARDIOGRAPHY  02/07/2012   EF 55%,SHOWED NO ISCHEMIA   .  HERNIA REPAIR     umbilical  . lower arterial  doppler  02/04/2013   aotra 1.5 x 1.5 cm; distal abd aorta  70-99%,proximal common iliac arteries -very stenotic with increased velocities>50%,may be falsely elevated as a result of residual plaque from the distal aorta stenosis  . lower extremity doppler  June 18 ,2013   ABI'S ABNORMAL, RABI was 0.88 and LABI 0.75 ,with 3-vessel  run off  . NM MYOVIEW LTD  MAY [id number]   showed no significant ischemia;  . NM MYOVIEW LTD  04/22/2008   ef 77%,exercise capcity 6 METS ,exaggerated blood pressure response to exercise  . PERIPHERAL VASCULAR CATHETERIZATION N/A 04/24/2016   Procedure: Lower Extremity Angiography;  Surgeon: Lorretta Harp, MD;  Location: [facility];  Service: Cardiovascular;  Laterality: N/A;  . PERIPHERAL VASCULAR CATHETERIZATION  04/24/2016   Procedure: Peripheral Vascular Intervention;  Surgeon: Lorretta Harp, MD;  Location: [facility];  Service: Cardiovascular;;  Aorta  . retrograde central aortic catheterization  05/19/2005   cutting balloon atherectomy, c-circ stenosis with DES STENTING CYPHER  . TONSILLECTOMY    . TRANSURETHRAL RESECTION OF PROSTATE N/A 09/08/2016   Procedure: TRANSURETHRAL RESECTION OF THE PROSTATE (TURP);  Surgeon: Ardis Hughs, MD;  Location: [facility];  Service: Urology;  Laterality: N/A;  . TRANSURETHRAL RESECTION OF PROSTATE     10-10-17  Dr. Louis Meckel  . TRANSURETHRAL RESECTION OF PROSTATE N/A 10/10/2017   Procedure: TRANSURETHRAL RESECTION OF THE PROSTATE (TURP);  Surgeon: Ardis Hughs, MD;  Location: [facility];  Service: Urology;  Laterality: N/A;  . VASECTOMY      Social History:  reports that he quit smoking about 50 years ago. His smoking use included cigarettes. He has a 40.00 pack-year smoking history. He has never used smokeless tobacco. He reports current alcohol use of about 4.0 standard drinks of alcohol per week. He reports that he does not use drugs.  Family History  Problem Relation Age of Onset  . Ovarian cancer Sister   . Heart disease Father   . Brain cancer Brother         Tumor       Physical Exam: Blood pressure (!) 210/73, pulse 79, temperature 98.8 F (37.1 C), temperature source Oral, resp. rate 16, height 5\' 4"  (1.626 m), weight 76.4 kg, SpO2 96 %.   Constitutional: Alert and awake, oriented x3, not in any acute distress. Eyes: PERLA, EOMI, irises appear normal, anicteric sclera,  ENMT: external ears and nose appear normal, oropharynx normal.    Neck: neck appears normal, no masses, normal ROM, no thyromegaly, no JVD  CVS: S1-S2 clear, no murmur rubs or gallops, no LE edema, normal pedal pulses  Respiratory:  clear to auscultation bilaterally, no wheezing, rales or rhonchi. Respiratory effort normal. No accessory muscle use.  Abdomen: soft nontender, nondistended, normal bowel sounds, no hepatosplenomegaly, no hernias  Musculoskeletal: : no cyanosis, clubbing or edema noted bilaterally Neuro: Cranial nerves II-XII intact, left knee has pain, postop, difficult to assess strength, right upper and lower extremity 5/5 Psych: judgement and insight appear normal, stable mood and affect Skin: no rashes or lesions or ulcers, no induration or nodules    Labs Data:  Basic Metabolic Panel: Recent Labs  Lab 01/04/19 0331  NA 132*  K 3.4*  CL 101  CO2 27  GLUCOSE 135*  BUN 20  CREATININE 0.68  CALCIUM 8.6*   Liver Function Tests: No results for input(s): AST, ALT, ALKPHOS, BILITOT, PROT, ALBUMIN  in the last 168 hours. No results for input(s): LIPASE, AMYLASE in the last 168 hours. No results for input(s): AMMONIA in the last 168 hours. CBC: Recent Labs  Lab 01/04/19 0331 01/05/19 0350  WBC 14.6* 13.3*  HGB 10.6* 10.7*  HCT 33.5* 35.3*  MCV 85.2 85.3  PLT 223 205   Cardiac Enzymes: No results for input(s): CKTOTAL, CKMB, CKMBINDEX, TROPONINI in the last 168 hours. BNP: Invalid input(s): POCBNP CBG: No results for input(s): GLUCAP in the last 168 hours.  Inpatient Medications:   Scheduled Meds: . [START ON 01/06/2019] amLODipine   10 mg Oral Daily  . amLODipine  5 mg Oral Once  . aspirin  81 mg Oral BID  . atenolol  50 mg Oral Daily  . atorvastatin  40 mg Oral q1800  . cholecalciferol  1,000 Units Oral Daily  . clopidogrel  75 mg Oral Daily  . docusate sodium  100 mg Oral BID  . ferrous sulfate  325 mg Oral TID PC  . hydrochlorothiazide  25 mg Oral Daily  . labetalol  20 mg Intravenous Once  . losartan  100 mg Oral QPM  . magnesium oxide  200 mg Oral QHS  . multivitamin with minerals  1 tablet Oral Daily  . pantoprazole  40 mg Oral BID  . potassium chloride SA  20 mEq Oral QPM  . vitamin C  500 mg Oral Daily   Continuous Infusions: . sodium chloride 50 mL/hr at 01/05/19 0646     Radiological Exams on Admission: No results found.  Impression/Recommendations    Accelerated hypertension: On multiple antihypertensives outpatient -BP elevated, 210/73, SBP has been running in 170s to 190s postoperatively, likely spiking due to pain and anxiety -Currently no symptoms of headache, visual blurring, chest pain, shortness of breath or dizziness.  Has a history of coronary disease. -will give 1 dose of labetalol 20 mg IV x1, increase Norvasc to 10 mg daily, losartan to 100 mg daily, continue HCTZ, beta-blocker at outpatient dose -Placed on hydralazine 10 mg IV every 6 hours as needed with parameters -Recommend better pain control, receiving Valium for anxiety  History of CAD, HLP -Currently no chest pain or shortness of breath, continue aspirin, Plavix, Lipitor -BP control as above  Left knee replacement -Postop day #2, management per orthopedics  Constipation -States no BM in the last 3 days, continue Colace, MiraLAX, added Dulcolax  TRH medicine service will follow the patient, thank you for this consultation.  Time Spent on consult 61 minutes  Zeynab Klett M.D. [facility] 01/05/2019, 9:55 AM

## 2019-01-06 ENCOUNTER — Encounter (HOSPITAL_COMMUNITY): Payer: Self-pay | Admitting: Orthopedic Surgery

## 2019-01-06 DIAGNOSIS — K219 Gastro-esophageal reflux disease without esophagitis: Secondary | ICD-10-CM

## 2019-01-06 DIAGNOSIS — D72829 Elevated white blood cell count, unspecified: Secondary | ICD-10-CM

## 2019-01-06 LAB — BASIC METABOLIC PANEL
Anion gap: 7 (ref 5–15)
BUN: 19 mg/dL (ref 8–23)
CO2: 28 mmol/L (ref 22–32)
Calcium: 8.3 mg/dL — ABNORMAL LOW (ref 8.9–10.3)
Chloride: 105 mmol/L (ref 98–111)
Creatinine, Ser: 0.91 mg/dL (ref 0.61–1.24)
GFR calc Af Amer: >60 mL/min (ref >60)
GFR calc non Af Amer: >60 mL/min (ref >60)
Glucose, Bld: 136 mg/dL — ABNORMAL HIGH (ref 70–99)
Potassium: 3.4 mmol/L — ABNORMAL LOW (ref 3.5–5.1)
Sodium: 140 mmol/L (ref 135–145)

## 2019-01-06 LAB — URINALYSIS, ROUTINE W REFLEX MICROSCOPIC
Bilirubin Urine: NEGATIVE
Glucose, UA: NEGATIVE mg/dL
Hgb urine dipstick: NEGATIVE
Ketones, ur: 5 mg/dL — AB
Leukocytes,Ua: NEGATIVE
Nitrite: NEGATIVE
Protein, ur: NEGATIVE mg/dL
Specific Gravity, Urine: 1.012 (ref 1.005–1.030)
pH: 7 (ref 5.0–8.0)

## 2019-01-06 MED ORDER — BISACODYL 10 MG RE SUPP
10.0000 mg | Freq: Once | RECTAL | Status: AC
  Administered 2019-01-06: 10 mg via RECTAL
  Filled 2019-01-06: qty 1

## 2019-01-06 MED ORDER — POTASSIUM CHLORIDE 20 MEQ PO PACK
40.0000 meq | PACK | Freq: Once | ORAL | Status: AC
  Administered 2019-01-06: 40 meq via ORAL
  Filled 2019-01-06: qty 2

## 2019-01-06 NOTE — Progress Notes (Signed)
PROGRESS NOTE    Natasha Paulson [facility]  HDQ:[id number]  DOB: 1931-01-25  DOA: 01/03/2019 PCP: [name], MD Medical consult follow up Brief Narrative:  85 year old male with history of hypertension on multiple antihypertensives, hyperlipidemia, coronary artery disease, GERD, gastroparesis who underwent left total knee arthroplasty, postop day #2.  Trial hospitalist service was consulted due to uncontrolled BP readings with SBP >200 yesterday.   Subjective: Systolic blood pressure fluctuating between 1 40-1 80s today he appears more comfortable and rates his pain level at 4/10.  He states he is anticipated to work with physical therapy today.  Patient noted to have leukocytosis on labs but been afebrile since admission, denies any dysuria/cough or shortness of breath.  Objective: Vitals:   01/06/19 0606 01/06/19 0645 01/06/19 0715 01/06/19 1255  BP: (!) 202/71 (!) 173/58 (!) 148/51 (!) 185/59  Pulse: 79 80 68 75  Resp: 14   18  Temp: 98.4 F (36.9 C)   97.9 F (36.6 C)  TempSrc: Oral   Oral  SpO2: 97%  98% 91%  Weight:      Height:        Intake/Output Summary (Last 24 hours) at 01/06/2019 1342 Last data filed at 01/06/2019 1331 Gross per 24 hour  Intake 1069.94 ml  Output 500 ml  Net 569.94 ml   Filed Weights   01/03/19 1500 01/04/19 0530  Weight: 76 kg 76.4 kg    Physical Examination:  General exam: Appears calm and comfortable  Respiratory system: Clear to auscultation. Respiratory effort normal. Cardiovascular system: S1 & S2 heard, RRR. No JVD, murmurs, rubs, gallops or clicks. No pedal edema. Gastrointestinal system: Abdomen is nondistended, soft and nontender. No organomegaly or masses felt. Normal bowel sounds heard. Central nervous system: Alert and oriented. No focal neurological deficits. Extremities: Local dressing and soft splint along left knee Skin: No rashes, lesions or ulcers Psychiatry: Judgement and insight appear normal. Mood & affect appropriate.      Data Reviewed: I have personally reviewed following labs and imaging studies  CBC: Recent Labs  Lab 01/04/19 0331 01/05/19 0350  WBC 14.6* 13.3*  HGB 10.6* 10.7*  HCT 33.5* 35.3*  MCV 85.2 85.3  PLT 223 119   Basic Metabolic Panel: Recent Labs  Lab 01/04/19 0331 01/06/19 0853  NA 132* 140  K 3.4* 3.4*  CL 101 105  CO2 27 28  GLUCOSE 135* 136*  BUN 20 19  CREATININE 0.68 0.91  CALCIUM 8.6* 8.3*   GFR: Estimated Creatinine Clearance: 55.5 mL/min (by C-G formula based on SCr of 0.91 mg/dL). Liver Function Tests: No results for input(s): AST, ALT, ALKPHOS, BILITOT, PROT, ALBUMIN in the last 168 hours. No results for input(s): LIPASE, AMYLASE in the last 168 hours. No results for input(s): AMMONIA in the last 168 hours. Coagulation Profile: No results for input(s): INR, PROTIME in the last 168 hours. Cardiac Enzymes: No results for input(s): CKTOTAL, CKMB, CKMBINDEX, TROPONINI in the last 168 hours. BNP (last 3 results) No results for input(s): PROBNP in the last 8760 hours. HbA1C: No results for input(s): HGBA1C in the last 72 hours. CBG: No results for input(s): GLUCAP in the last 168 hours. Lipid Profile: No results for input(s): CHOL, HDL, LDLCALC, TRIG, CHOLHDL, LDLDIRECT in the last 72 hours. Thyroid Function Tests: No results for input(s): TSH, T4TOTAL, FREET4, T3FREE, THYROIDAB in the last 72 hours. Anemia Panel: No results for input(s): VITAMINB12, FOLATE, FERRITIN, TIBC, IRON, RETICCTPCT in the last 72 hours. Sepsis Labs: No results for input(s): PROCALCITON,  LATICACIDVEN in the last 168 hours.  Recent Results (from the past 240 hour(s))  Novel Coronavirus, NAA (hospital order; send-out to ref lab)     Status: None   Collection Time: 12/31/18  9:27 AM  Result Value Ref Range Status   SARS-CoV-2, NAA NOT DETECTED NOT DETECTED Final    Comment: (NOTE) This test was developed and its performance characteristics determined by [employer]. This  test has not been FDA cleared or approved. This test has been authorized by FDA under an Emergency Use Authorization (EUA). This test is only authorized for the duration of time the declaration that circumstances exist justifying the authorization of the emergency use of in vitro diagnostic tests for detection of SARS-CoV-2 virus and/or diagnosis of COVID-19 infection under section 564(b)(1) of the Act, 21 U.S.C. 329JME-2(A)(8), unless the authorization is terminated or revoked sooner. When diagnostic testing is negative, the possibility of a false negative result should be considered in the context of a patient's recent exposures and the presence of clinical signs and symptoms consistent with COVID-19. An individual without symptoms of COVID-19 and who is not shedding SARS-CoV-2 virus would expect to have a negative (not detected) result in this assay. Performed  At: [facility] [address] [place], [place] [id number] Rush Farmer MD NL:[id number]    [place]  Final    Comment: Performed at [facility], [place] [address] [place], [place] [address]      Radiology Studies: No results found.      Scheduled Meds: . amLODipine  10 mg Oral Daily  . aspirin  81 mg Oral BID  . atenolol  50 mg Oral Daily  . atorvastatin  40 mg Oral q1800  . cholecalciferol  1,000 Units Oral Daily  . clopidogrel  75 mg Oral Daily  . docusate sodium  100 mg Oral BID  . ferrous sulfate  325 mg Oral TID PC  . hydrochlorothiazide  25 mg Oral Daily  . losartan  100 mg Oral QPM  . magnesium oxide  200 mg Oral QHS  . multivitamin with minerals  1 tablet Oral Daily  . pantoprazole  40 mg Oral BID  . potassium chloride SA  20 mEq Oral QPM  . vitamin C  500 mg Oral Daily   Continuous Infusions: . sodium chloride 50 mL/hr at 01/06/19 0549    Assessment & Plan:    1.  Uncontrolled hypertension: Currently on Norvasc 10 mg atenolol 50 mg, losartan 100 mg,  hydrochlorothiazide 25 mg and hydralazine PRN.  Overall his systolic blood pressure appears to be much improved since yesterday although fluctuating up to 180s today.  Given recent change in medications/dosing, will defer any further changes today in order to avoid rapid fluctuations in blood pressures.  May utilize hydralazine IV as needed for extreme blood pressure readings.  Ensure adequate control of pain.  DC IV fluids  2.  Leukocytosis: Likely reactive in the setting of knee surgery.  Afebrile and no other clinical stigmata of infection.  Will check UA and repeat CBC in a.m.  3.  Mild hypokalemia: Likely in the setting of diuretics.  Replace.  DC IV fluids  4.  CAD: Continue dual antiplatelet agents, beta-blockers and statins  5.  Gastroparesis/GERD: Resumed on home medications  DVT prophylaxis: defer to primary service, currently not on heparin/lovenox      LOS: 3 days    Time spent: 25 minutes    Guilford Shi, MD [facility] Pager [phone]  If 7PM-7AM, please contact night-coverage [email or web address] Password [email or web address] 01/06/2019, 1:42 PM

## 2019-01-06 NOTE — Progress Notes (Signed)
Physical Therapy Treatment Patient Details Name: Nathan Santiago MRN: [id number] DOB: 25-Feb-1931 Today's Date: 01/06/2019    History of Present Illness 85 yo male s/p L TKR on 01/03/19. PMH includes AAA, CAD with cardiac cath, gait abnormality, HTN, HLD, PAD.     PT Comments    Pt ambulated in hallway and requires increased assist for mobility at this time due to posterior lean in standing.  Continue to recommend SNF upon d/c.  Follow Up Recommendations  SNF;Follow surgeon's recommendation for DC plan and follow-up therapies     Equipment Recommendations  Rolling walker with 5" wheels    Recommendations for Other Services       Precautions / Restrictions Precautions Precautions: Fall;Knee Required Braces or Orthoses: Knee Immobilizer - Left Restrictions Other Position/Activity Restrictions: WBAT    Mobility  Bed Mobility Overal bed mobility: Needs Assistance Bed Mobility: Supine to Sit     Supine to sit: Mod assist;+2 for safety/equipment     General bed mobility comments: verbal cues for technique, assist to scoot to EOB and trunk upright  Transfers Overall transfer level: Needs assistance Equipment used: Rolling walker (2 wheeled) Transfers: Sit to/from Stand Sit to Stand: Mod assist;+2 safety/equipment;+2 physical assistance         General transfer comment: verbal cues for UE and LE positioning, pt with posterior bias upon rise and required assist to find COG  Ambulation/Gait Ambulation/Gait assistance: Min assist;+2 safety/equipment Gait Distance (Feet): 15 Feet Assistive device: Rolling walker (2 wheeled) Gait Pattern/deviations: Leaning posteriorly;Step-to pattern;Trunk flexed Gait velocity: decr    General Gait Details: verbal cues for sequence, RW positioning and correcting initial posterior bias, recliner following for safety, pt fatigued quickly, SPo2 93% on room air upon return to room   Stairs             Wheelchair Mobility     Modified Rankin (Stroke Patients Only)       Balance                                            Cognition Arousal/Alertness: Awake/alert Behavior During Therapy: WFL for tasks assessed/performed Overall Cognitive Status: Within Functional Limits for tasks assessed                                 General Comments: appropriate and following commands      Exercises      General Comments        Pertinent Vitals/Pain Pain Assessment: 0-10 Pain Score: 4  Pain Location: L knee Pain Descriptors / Indicators: Sore;Aching Pain Intervention(s): Limited activity within patient's tolerance;Premedicated before session;Repositioned;Monitored during session;Ice applied    Home Living                      Prior Function            PT Goals (current goals can now be found in the care plan section) Progress towards PT goals: Progressing toward goals    Frequency    7X/week      PT Plan Discharge plan needs to be updated    Co-evaluation              AM-PAC PT "6 Clicks" Mobility   Outcome Measure  Help needed turning from your back to your side while in  a flat bed without using bedrails?: A Little Help needed moving from lying on your back to sitting on the side of a flat bed without using bedrails?: A Lot Help needed moving to and from a bed to a chair (including a wheelchair)?: A Lot Help needed standing up from a chair using your arms (e.g., wheelchair or bedside chair)?: A Lot Help needed to walk in hospital room?: A Lot Help needed climbing 3-5 steps with a railing? : A Lot 6 Click Score: 13    End of Session Equipment Utilized During Treatment: Gait belt Activity Tolerance: Patient limited by fatigue;Patient limited by pain Patient left: with call bell/phone within reach;in chair;with chair alarm set Nurse Communication: Mobility status PT Visit Diagnosis: Other abnormalities of gait and mobility  (R26.89);Difficulty in walking, not elsewhere classified (R26.2)     Time: 2751-7001 PT Time Calculation (min) (ACUTE ONLY): 17 min  Charges:  $Gait Training: 8-22 mins                     Carmelia Bake, PT, DPT Acute Rehabilitation Services Office: [phone] Pager: [phone]  Trena Platt 01/06/2019, 3:15 PM

## 2019-01-06 NOTE — Progress Notes (Signed)
   Subjective: 3 Days Post-Op Procedure(s) (LRB): TOTAL KNEE ARTHROPLASTY (Left)  Pt c/o moderate pain and soreness in the left knee Plan for rehab at his retirement facility States it is hard to lift his leg and activate his quad Denies any new symptoms Patient reports pain as moderate.  Objective:   VITALS:   Vitals:   01/06/19 0645 01/06/19 0715  BP: (!) 173/58 (!) 148/51  Pulse: 80 68  Resp:    Temp:    SpO2:  98%    Left knee incision healing well nv intact distally No rashes or edema distally   LABS Recent Labs    01/04/19 0331 01/05/19 0350  HGB 10.6* 10.7*  HCT 33.5* 35.3*  WBC 14.6* 13.3*  PLT 223 205    Recent Labs    01/04/19 0331  NA 132*  K 3.4*  BUN 20  CREATININE 0.68  GLUCOSE 135*     Assessment/Plan: 3 Days Post-Op Procedure(s) (LRB): TOTAL KNEE ARTHROPLASTY (Left) Will re-eval later today and discuss possible d/c today vs tomorrow Pain management Continue PT/OT Pt in agreement    Brad Luna [place], MPAS [place] [facility] now [employer] [address] [place], [place], [place] [address] Phone: [phone] [email or web address] Facebook  [employer]

## 2019-01-06 NOTE — Care Management Important Message (Signed)
Important Message  Patient Details IM Letter given to [name] SW to present to the Patient Name: Nathan Santiago MRN: [id number] Date of Birth: 1931-02-05   Medicare Important Message Given:  Yes    [name] 01/06/2019, 11:24 AM

## 2019-01-07 ENCOUNTER — Other Ambulatory Visit: Payer: Self-pay | Admitting: Internal Medicine

## 2019-01-07 MED ORDER — OXYCODONE HCL 5 MG PO TABS: 5.0000 mg | ORAL_TABLET | ORAL | 0 refills | Status: DC | PRN

## 2019-01-07 NOTE — Plan of Care (Signed)
Patient discharged to [facility] in stable condition. Packet sent with [facility] staff

## 2019-01-07 NOTE — Discharge Summary (Signed)
Orthopedic Discharge Summary        Physician Discharge Summary  Patient ID: Nathan Santiago MRN: [id number] DOB/AGE: [birth year] 83 y.o.  Admit date: 01/03/2019 Discharge date: 01/07/2019   Procedures:  Procedure(s) (LRB): TOTAL KNEE ARTHROPLASTY (Left)  Attending Physician:  Dr. Esmond Plants  Admission Diagnoses:   Left knee end stage osteoarthritis  Discharge Diagnoses:  Left knee end stage osteoarthritis   Past Medical History:  Diagnosis Date  . AAA (abdominal aortic aneurysm) (HCC)    asymptomatic  . AAA (abdominal aortic aneurysm) ([place]) 2010   peripheral  angiogram-- bilateral SFA DISEASE  and 60 to 70% infrarenaal abd. aortic stenosis with 15 -mm gradient  . Adenomatous colon polyp 11/2003  . CAD (coronary artery disease)   . Gait abnormality 02/13/2017  . Gastroparesis    pt denies  . GERD (gastroesophageal reflux disease)    w/ LPR  . History of kidney stones    x2  . Hyperlipidemia   . Hypertension   . Peripheral arterial disease ([place])    RCEA  by Dr Lemar Livings  . PUD (peptic ulcer disease)    pt unaware  . Vertigo     PCP: [name], MD   Discharged Condition: fair  Hospital Course:  Patient underwent the above stated procedure on 01/03/2019. Patient tolerated the procedure well and brought to the recovery room in good condition and subsequently to the floor. Patient had an uncomplicated hospital course and was stable for discharge.   Disposition: Discharge disposition: 01-Home or Self Care      with follow up in 2 weeks   Follow-up Information    Netta Cedars, MD. Call in 2 weeks.   Specialty:  Orthopedic Surgery Why:  [phone] Contact information: [address] [place] [address] [phone]        [facility] Follow up.   Specialty:  [place] Why:  agency will contact you to set up initial visit for home health physical therapy Contact information: [place] [address] [phone]           Discharge Instructions    Call MD / Call 911   Complete by:  As directed    If you experience chest pain or shortness of breath, CALL 911 and be transported to the hospital emergency room.  If you develope a fever above 101 F, pus (white drainage) or increased drainage or redness at the wound, or calf pain, call your surgeon's office.   Constipation Prevention   Complete by:  As directed    Drink plenty of fluids.  Prune juice may be helpful.  You may use a stool softener, such as Colace (over the counter) 100 mg twice a day.  Use MiraLax (over the counter) for constipation as needed.   Diet - low sodium heart healthy   Complete by:  As directed    Increase activity slowly as tolerated   Complete by:  As directed       Allergies as of 01/07/2019      Reactions   Lisinopril Cough   Niaspan [niacin Er] Rash      Medication List    STOP taking these medications   cilostazol 100 MG tablet Commonly known as:  PLETAL     TAKE these medications   amLODipine 5 MG tablet Commonly known as:  NORVASC Take 5 mg by mouth daily.   aspirin EC 81 MG tablet Take 1  tablet (81 mg total) by mouth 2 (two) times a day. What changed:    when to take this  Another medication with the same name was removed. Continue taking this medication, and follow the directions you see here.   atenolol 50 MG tablet Commonly known as:  TENORMIN Take 50 mg by mouth daily.   atorvastatin 40 MG tablet Commonly known as:  LIPITOR Take 40 mg by mouth daily at 6 PM.   benzonatate 100 MG capsule Commonly known as:  TESSALON Take 100 mg by mouth at bedtime as needed for cough.   Biotin 10 MG Caps Take 10 mg by mouth daily.   bismuth subsalicylate 169 CV/89FY suspension Commonly known as:  PEPTO BISMOL Take 30 mLs by mouth every 6 (six) hours as needed for indigestion.   cholecalciferol 25 MCG (1000 UT) tablet Commonly known as:  VITAMIN D3 Take 1,000  Units by mouth daily.   clopidogrel 75 MG tablet Commonly known as:  PLAVIX Take 75 mg by mouth every evening.   diazepam 5 MG tablet Commonly known as:  VALIUM Take 2.5-5 mg by mouth daily as needed for anxiety.   diphenhydrAMINE 25 MG tablet Commonly known as:  BENADRYL Take 25 mg by mouth every evening.   Glucosamine Chondroitin Triple Tabs Take 2 tablets by mouth daily.   hydrochlorothiazide 25 MG tablet Commonly known as:  HYDRODIURIL Take 25 mg by mouth daily.   losartan 50 MG tablet Commonly known as:  COZAAR Take 50 mg by mouth every evening.   Magnesium 250 MG Tabs Take 250 mg by mouth at bedtime.   multivitamin with minerals Tabs tablet Take 1 tablet by mouth daily.   OVER THE COUNTER MEDICATION Apply 1 application topically daily. Hemp oil cream   oxyCODONE-acetaminophen 5-325 MG tablet Commonly known as:  Percocet Take 1 tablet by mouth every 4 (four) hours as needed for moderate pain or severe pain.   pantoprazole 40 MG tablet Commonly known as:  PROTONIX TAKE 1 TABLET BY MOUTH TWICE A DAY What changed:  when to take this   potassium chloride SA 20 MEQ tablet Commonly known as:  K-DUR Take 20 mEq by mouth every evening.   PREVAGEN PO Take 1 tablet by mouth daily.   vitamin C 500 MG tablet Commonly known as:  ASCORBIC ACID Take 500 mg by mouth daily.            Durable Medical Equipment  (From admission, onward)         Start     Ordered   01/04/19 1057  For home use only DME Walker rolling  Once    Question:  Patient needs a walker to treat with the following condition  Answer:  S/P knee replacement   01/04/19 1058            Signed: Ventura Bruns 01/07/2019, 8:10 AM  [name] is now [employer] [address] [place], [place], [place] [address] Phone: [place]

## 2019-01-07 NOTE — Progress Notes (Signed)
Physical Therapy Treatment Patient Details Name: Nathan Santiago MRN: [id number] DOB: January 18, 1931 Today's Date: 01/07/2019    History of Present Illness 85 yo male s/p L TKR on 01/03/19. PMH includes AAA, CAD with cardiac cath, gait abnormality, HTN, HLD, PAD.     PT Comments    Pt performed LE exercises and then assisted to recliner.  Pt requiring at least mod assist at this time.  Pt also presents with very shaky UEs during mobility today (had a little yesterday however resolved) so RN notified.  Pt likely to d/c to SNF today.   Follow Up Recommendations  SNF;Follow surgeon's recommendation for DC plan and follow-up therapies     Equipment Recommendations  Rolling walker with 5" wheels    Recommendations for Other Services       Precautions / Restrictions Precautions Precautions: Fall;Knee Required Braces or Orthoses: Knee Immobilizer - Left Restrictions Other Position/Activity Restrictions: WBAT    Mobility  Bed Mobility Overal bed mobility: Needs Assistance Bed Mobility: Supine to Sit     Supine to sit: Mod assist     General bed mobility comments: verbal cues for technique, assist to scoot to EOB and trunk upright  Transfers Overall transfer level: Needs assistance Equipment used: Rolling walker (2 wheeled) Transfers: Sit to/from [facility] Sit to Stand: Mod assist;From elevated surface Stand pivot transfers: Min assist       General transfer comment: verbal cues for UE and LE positioning, pt with posterior bias upon rise and required assist to find COG; pt with very tremulous UEs today which continued with use of RW  Ambulation/Gait             General Gait Details: pt declined - shaky, pain, fatigued   Stairs             Wheelchair Mobility    Modified Rankin (Stroke Patients Only)       Balance                                            Cognition Arousal/Alertness: Awake/alert Behavior During  Therapy: WFL for tasks assessed/performed Overall Cognitive Status: Within Functional Limits for tasks assessed                                 General Comments: appropriate and following commands      Exercises Total Joint Exercises Ankle Circles/Pumps: AROM;Both;20 reps;Supine Quad Sets: AROM;Both;10 reps;Supine Short Arc Quad: AAROM;10 reps;Left;Supine Heel Slides: AAROM;Left;Supine;10 reps Hip ABduction/ADduction: AAROM;10 reps;Left    General Comments        Pertinent Vitals/Pain Pain Assessment: 0-10 Pain Score: 5  Pain Location: L knee Pain Descriptors / Indicators: Sore;Aching Pain Intervention(s): Repositioned;Monitored during session;Limited activity within patient's tolerance    Home Living                      Prior Function            PT Goals (current goals can now be found in the care plan section) Progress towards PT goals: Progressing toward goals    Frequency    7X/week      PT Plan Current plan remains appropriate    Co-evaluation              AM-PAC PT "6 Clicks" Mobility  Outcome Measure  Help needed turning from your back to your side while in a flat bed without using bedrails?: A Little Help needed moving from lying on your back to sitting on the side of a flat bed without using bedrails?: A Lot Help needed moving to and from a bed to a chair (including a wheelchair)?: A Lot Help needed standing up from a chair using your arms (e.g., wheelchair or bedside chair)?: A Lot Help needed to walk in hospital room?: A Lot Help needed climbing 3-5 steps with a railing? : A Lot 6 Click Score: 13    End of Session Equipment Utilized During Treatment: Gait belt Activity Tolerance: Patient limited by fatigue;Patient limited by pain Patient left: in chair;with call bell/phone within reach;with chair alarm set Nurse Communication: Mobility status PT Visit Diagnosis: Other abnormalities of gait and mobility  (R26.89);Difficulty in walking, not elsewhere classified (R26.2)     Time: 7353-2992 PT Time Calculation (min) (ACUTE ONLY): 20 min  Charges:  $Therapeutic Exercise: 8-22 mins                    Carmelia Bake, PT, DPT Acute Rehabilitation Services Office: [phone] Pager: [phone]  Trena Platt 01/07/2019, 1:26 PM

## 2019-01-07 NOTE — Progress Notes (Signed)
PROGRESS NOTE    Nathan Santiago [facility]  YNW:[id number] DOB: 1931-01-08 DOA: 01/03/2019 PCP: [name], MD   Brief Narrative:  85 year old with history of essential hypertension, hyperlipidemia, CAD, gastroparesis admitted for total left knee arthroplasty.  Medical team consulted for uncontrolled blood pressure.   Assessment & Plan:   Principal Problem:   Status post total knee replacement, left Active Problems:   Essential hypertension  Uncontrolled blood pressure with history of essential hypertension -Continue Norvasc 10 mg, atenolol 50 mg, losartan 100 mg, hydrochlorothiazide 25 mg.  Advised 2 g salt diet.  Can space out these medications as necessary.  Needs better pain control.  Follow-up outpatient with primary care provider in about 1-2 weeks.  Need to check his blood pressure routinely outpatient.  Status post left total knee arthroplasty - Management per orthopedic.  Discharge to skilled nursing facility.  Pain control and bowel regimen.  Coronary artery disease -Resume home meds.  Currently chest pain-free  GERD -Continue home meds  DVT prophylaxis: Per primary team Code Status: Full code Family Communication: None at bedside Disposition Plan: Patient can be discharged from our standpoint.    Subjective: Denies any complaints.  No acute events overnight.  Review of Systems Otherwise negative except as per HPI, including: General: Denies fever, chills, night sweats or unintended weight loss. Resp: Denies cough, wheezing, shortness of breath. Cardiac: Denies chest pain, palpitations, orthopnea, paroxysmal nocturnal dyspnea. GI: Denies abdominal pain, nausea, vomiting, diarrhea or constipation GU: Denies dysuria, frequency, hesitancy or incontinence MS: Denies muscle aches, joint pain or swelling Neuro: Denies headache, neurologic deficits (focal weakness, numbness, tingling), abnormal gait Psych: Denies anxiety, depression, SI/HI/AVH Skin: Denies new rashes or  lesions ID: Denies sick contacts, exotic exposures, travel  Objective: Vitals:   01/06/19 2122 01/06/19 2346 01/07/19 0048 01/07/19 0657  BP: (!) 174/51 (!) 184/54 (!) 143/49 (!) 174/53  Pulse: 78 80 79 75  Resp: 18 18 18 17   Temp: 99 F (37.2 C)  98.3 F (36.8 C) 98.2 F (36.8 C)  TempSrc: Oral  Oral Oral  SpO2: 92% 94% 93% 94%  Weight:      Height:        Intake/Output Summary (Last 24 hours) at 01/07/2019 0918 Last data filed at 01/07/2019 0700 Gross per 24 hour  Intake 1254.07 ml  Output 1150 ml  Net 104.07 ml   Filed Weights   01/03/19 1500 01/04/19 0530  Weight: 76 kg 76.4 kg    Examination:  General exam: Appears calm and comfortable  Respiratory system: Clear to auscultation. Respiratory effort normal. Cardiovascular system: S1 & S2 heard, RRR. No JVD, murmurs, rubs, gallops or clicks. No pedal edema. Gastrointestinal system: Abdomen is nondistended, soft and nontender. No organomegaly or masses felt. Normal bowel sounds heard. Central nervous system: Alert and oriented. No focal neurological deficits. Extremities: Symmetric 5 x 5 power. Skin: Left knee surgical dressing noted.  Clean dry and intact. Psychiatry: Judgement and insight appear normal. Mood & affect appropriate.     Data Reviewed:   CBC: Recent Labs  Lab 01/04/19 0331 01/05/19 0350  WBC 14.6* 13.3*  HGB 10.6* 10.7*  HCT 33.5* 35.3*  MCV 85.2 85.3  PLT 223 657   Basic Metabolic Panel: Recent Labs  Lab 01/04/19 0331 01/06/19 0853  NA 132* 140  K 3.4* 3.4*  CL 101 105  CO2 27 28  GLUCOSE 135* 136*  BUN 20 19  CREATININE 0.68 0.91  CALCIUM 8.6* 8.3*   GFR: Estimated Creatinine Clearance: 55.5 mL/min (  by C-G formula based on SCr of 0.91 mg/dL). Liver Function Tests: No results for input(s): AST, ALT, ALKPHOS, BILITOT, PROT, ALBUMIN in the last 168 hours. No results for input(s): LIPASE, AMYLASE in the last 168 hours. No results for input(s): AMMONIA in the last 168 hours.  Coagulation Profile: No results for input(s): INR, PROTIME in the last 168 hours. Cardiac Enzymes: No results for input(s): CKTOTAL, CKMB, CKMBINDEX, TROPONINI in the last 168 hours. BNP (last 3 results) No results for input(s): PROBNP in the last 8760 hours. HbA1C: No results for input(s): HGBA1C in the last 72 hours. CBG: No results for input(s): GLUCAP in the last 168 hours. Lipid Profile: No results for input(s): CHOL, HDL, LDLCALC, TRIG, CHOLHDL, LDLDIRECT in the last 72 hours. Thyroid Function Tests: No results for input(s): TSH, T4TOTAL, FREET4, T3FREE, THYROIDAB in the last 72 hours. Anemia Panel: No results for input(s): VITAMINB12, FOLATE, FERRITIN, TIBC, IRON, RETICCTPCT in the last 72 hours. Sepsis Labs: No results for input(s): PROCALCITON, LATICACIDVEN in the last 168 hours.  Recent Results (from the past 240 hour(s))  Novel Coronavirus, NAA (hospital order; send-out to ref lab)     Status: None   Collection Time: 12/31/18  9:27 AM  Result Value Ref Range Status   SARS-CoV-2, NAA NOT DETECTED NOT DETECTED Final    Comment: (NOTE) This test was developed and its performance characteristics determined by [employer]. This test has not been FDA cleared or approved. This test has been authorized by FDA under an Emergency Use Authorization (EUA). This test is only authorized for the duration of time the declaration that circumstances exist justifying the authorization of the emergency use of in vitro diagnostic tests for detection of SARS-CoV-2 virus and/or diagnosis of COVID-19 infection under section 564(b)(1) of the Act, 21 U.S.C. 338SNK-5(L)(9), unless the authorization is terminated or revoked sooner. When diagnostic testing is negative, the possibility of a false negative result should be considered in the context of a patient's recent exposures and the presence of clinical signs and symptoms consistent with COVID-19. An individual without symptoms of  COVID-19 and who is not shedding SARS-CoV-2 virus would expect to have a negative (not detected) result in this assay. Performed  At: [facility] [address] [place], [place] [id number] Rush Farmer MD TK:[id number]    [place]  Final    Comment: Performed at [facility], [place] [address] [place], [place] [address]         Radiology Studies: No results found.      Scheduled Meds: . amLODipine  10 mg Oral Daily  . aspirin  81 mg Oral BID  . atenolol  50 mg Oral Daily  . atorvastatin  40 mg Oral q1800  . cholecalciferol  1,000 Units Oral Daily  . clopidogrel  75 mg Oral Daily  . docusate sodium  100 mg Oral BID  . ferrous sulfate  325 mg Oral TID PC  . hydrochlorothiazide  25 mg Oral Daily  . losartan  100 mg Oral QPM  . magnesium oxide  200 mg Oral QHS  . multivitamin with minerals  1 tablet Oral Daily  . pantoprazole  40 mg Oral BID  . potassium chloride SA  20 mEq Oral QPM  . vitamin C  500 mg Oral Daily   Continuous Infusions:   LOS: 4 days   Time spent= 15 mins    Keria Widrig Arsenio Loader, MD [facility]  If 7PM-7AM, please contact night-coverage [email or web address] 01/07/2019, 9:18 AM

## 2019-01-07 NOTE — TOC Transition Note (Signed)
Transition of Care [facility]) - CM/SW Discharge Note   Patient Details  Name: Nathan Santiago MRN: [id number] Date of Birth: [birth year]  [facility] CM/SW Contact:  Lia Hopping, [place] Phone Number: 01/07/2019, 9:36 AM   Clinical Narrative:    Current discharge plan-SNF at [facility] for short rehab.    Final next level of care: Skilled Nursing Facility Barriers to Discharge: No Barriers Identified   Patient Goals and CMS Choice Patient states their goals for this hospitalization and ongoing recovery are:: to go home  CMS Medicare.gov Compare Post Acute Care list provided to:: Patient Choice offered to / list presented to : Patient  Discharge Placement              Patient chooses bed at: [facility] Patient to be transferred to facility by: [place]  Name of family member notified: Patient notified spouse  Patient and family notified of of transfer: 01/07/19  Discharge Plan and Services   Discharge Planning Services: CM Consult Post Acute Care Choice: Home Health, Durable Medical Equipment          DME Arranged: Walker rolling DME Agency: [employer] Date DME Agency Contacted: 01/04/19 Time DME Agency Contacted: 7829 Representative spoke with at DME Agency: [place]: PT [place]: [facility] [employer] Date [place]: 01/04/19 Time [place]: 1245 Representative spoke with at [place]: [place] ([place]) Interventions     Readmission Risk Interventions No flowsheet data found.

## 2019-01-07 NOTE — Plan of Care (Signed)
  Problem: Education: Goal: Knowledge of the prescribed therapeutic regimen will improve Outcome: Progressing   Problem: Activity: Goal: Ability to avoid complications of mobility impairment will improve Outcome: Progressing   Problem: Pain Management: Goal: Pain level will decrease with appropriate interventions Outcome: Progressing   

## 2019-01-07 NOTE — Progress Notes (Signed)
   01/06/19 2346  MEWS Score  BP (!) 184/54   RN gave hydralazine as ordered. Will reassess BP.

## 2019-01-07 NOTE — Progress Notes (Signed)
   Subjective: 4 Days Post-Op Procedure(s) (LRB): TOTAL KNEE ARTHROPLASTY (Left)  Recheck left knee s/p replacement Pt doing fairly well Ready for d/c back to his facility Denies any new symptoms or issues Patient reports pain as moderate.  Objective:   VITALS:   Vitals:   01/07/19 0048 01/07/19 0657  BP: (!) 143/49 (!) 174/53  Pulse: 79 75  Resp: 18 17  Temp: 98.3 F (36.8 C) 98.2 F (36.8 C)  SpO2: 93% 94%    Left knee incision healing well nv intact distally No rashes or edema  Slight quad activation  LABS Recent Labs    01/05/19 0350  HGB 10.7*  HCT 35.3*  WBC 13.3*  PLT 205    Recent Labs    01/06/19 0853  NA 140  K 3.4*  BUN 19  CREATININE 0.91  GLUCOSE 136*     Assessment/Plan: 4 Days Post-Op Procedure(s) (LRB): TOTAL KNEE ARTHROPLASTY (Left) Plan to d/c to facility today F/u in 2 weeks in the office Continue PT/OT Pain management as needed     Kathrynn Speed, [place] is now [employer] [facility] [address] [place], [place], [place] [address] Phone: [phone] [email or web address] Facebook  [employer]

## 2019-01-10 ENCOUNTER — Encounter: Payer: Self-pay | Admitting: Internal Medicine

## 2019-01-10 ENCOUNTER — Non-Acute Institutional Stay (SKILLED_NURSING_FACILITY): Payer: Medicare Other | Admitting: Internal Medicine

## 2019-01-10 DIAGNOSIS — M1712 Unilateral primary osteoarthritis, left knee: Secondary | ICD-10-CM | POA: Diagnosis not present

## 2019-01-10 DIAGNOSIS — I10 Essential (primary) hypertension: Secondary | ICD-10-CM | POA: Diagnosis not present

## 2019-01-10 DIAGNOSIS — R410 Disorientation, unspecified: Secondary | ICD-10-CM | POA: Diagnosis not present

## 2019-01-10 DIAGNOSIS — I2510 Atherosclerotic heart disease of native coronary artery without angina pectoris: Secondary | ICD-10-CM

## 2019-01-10 DIAGNOSIS — E7800 Pure hypercholesterolemia, unspecified: Secondary | ICD-10-CM

## 2019-01-10 DIAGNOSIS — Z96652 Presence of left artificial knee joint: Secondary | ICD-10-CM | POA: Diagnosis not present

## 2019-01-10 NOTE — Progress Notes (Signed)
Patient ID: Nathan Santiago, male   DOB: 19-Dec-1930, 85 y.o.   MRN: [id number]  Provider:  Rexene Edison. Mariea Clonts, D.O., C.M.D. Location:  [facility] Room Number: [id number] Place of Service:  SNF ([phone])  PCP: [name], MD Patient Care Team: [name], MD as PCP - General (Endocrinology) Lorretta Harp, MD as PCP - Cardiology (Cardiology)  Extended Emergency Contact Information Primary Emergency Contact: Marjie Skiff Address: [address]          [place], [place] [address] Johnnette Litter of [place] Phone: [phone] Mobile Phone: [phone] Relation: Spouse  Goals of Care: Advanced Directive information Advanced Directives 01/10/2019  Does Patient Have a Medical Advance Directive? Yes  Type of Advance Directive Living will;Healthcare Power of Attorney  Does patient want to make changes to medical advance directive? No - Patient declined  Copy of [place] in Chart? Yes - validated most recent copy scanned in chart (See row information)   Chief Complaint  Patient presents with  . Establish Care    new patient    HPI: Patient is a 85 y.o. male seen today for admission to [facility] for rehab s/p left total knee arthroplasty on 01/03/2019 with Dr. Veverly Fells.  He has a h/o AAA, CAD, gastroparesis, GERD, kidney stones, htn, hyperlipidemia, PUD, vertigo.  His PCP is Dr. Forde Dandy.  When seen, he had received his pain medication within the hour and was quite sleepy and confused.  He was still c/o pain in his left posterior hip region, not the knee.  His nurse helped me to see the knee which had very little swelling, a tiny bit of medial ecchymoses and the dressing was intact.  He is here receiving PT, OT.  He admits to being tired after his morning therapy.    His BPs are running high from 073X-106Y systolic, but he's having considerable pain.  I reviewed his oxycodone use:  He used 4 single tablets the first day, 4 the  second day, 2 sets of 2 tablets yesterday and so far today.  He has not had any of his prn diazepam for sleep, fortunately.      Past Medical History:  Diagnosis Date  . AAA (abdominal aortic aneurysm) (HCC)    asymptomatic  . AAA (abdominal aortic aneurysm) ([facility]) 2010   peripheral  angiogram-- bilateral SFA DISEASE  and 60 to 70% infrarenaal abd. aortic stenosis with 15 -mm gradient  . Adenomatous colon polyp 11/2003  . CAD (coronary artery disease)   . Gait abnormality 02/13/2017  . Gastroparesis    pt denies  . GERD (gastroesophageal reflux disease)    w/ LPR  . History of kidney stones    x2  . Hyperlipidemia   . Hypertension   . Peripheral arterial disease ([facility])    RCEA  by Dr Lemar Livings  . PUD (peptic ulcer disease)    pt unaware  . Vertigo    Past Surgical History:  Procedure Laterality Date  . ABDOMINAL AORTOGRAM W/LOWER EXTREMITY Bilateral 08/05/2018   Procedure: ABDOMINAL AORTOGRAM W/LOWER EXTREMITY;  Surgeon: Lorretta Harp, MD;  Location: [facility];  Service: Cardiovascular;  Laterality: Bilateral;  . AORTOGRAM  04/24/2016    Abdominal aortogram, bilateral iliac angiogram, bifemoral runoff  . CARDIAC CATHETERIZATION  05/12/2005   RCA  . carotid doppler  01/24/2013   RICA endarterectomy,left CCA 0-49%; left bulb and prox ICA 50-69%; bilaateral subclavian < 50%  . CAROTID ENDARTERECTOMY  11/01/2010  .  CATARACT EXTRACTION    . COLONOSCOPY    . CORONARY ANGIOPLASTY  05/18/2005   2 STENTS distal RCA AND PROXIMAL-MID RCA   5 total stents per pt.  . CYSTOSCOPY/URETEROSCOPY/HOLMIUM LASER/STENT PLACEMENT Left 01/11/2018   Procedure: LEFT URETEROSCOPY/HOLMIUM LASER/STENT PLACEMENT;  Surgeon: Ardis Hughs, MD;  Location: [facility];  Service: Urology;  Laterality: Left;  . DOPPLER ECHOCARDIOGRAPHY  02/07/2012   EF 55%,SHOWED NO ISCHEMIA   . HERNIA REPAIR     umbilical  . lower arterial  doppler  02/04/2013   aotra 1.5 x 1.5 cm; distal abd aorta  70-99%,proximal common iliac arteries -very stenotic with increased velocities>50%,may be falsely elevated as a result of residual plaque from the distal aorta stenosis  . lower extremity doppler  June 18 ,2013   ABI'S ABNORMAL, RABI was 0.88 and LABI 0.75 ,with 3-vessel  run off  . NM MYOVIEW LTD  MAY [id number]   showed no significant ischemia;  . NM MYOVIEW LTD  04/22/2008   ef 77%,exercise capcity 6 METS ,exaggerated blood pressure response to exercise  . PERIPHERAL VASCULAR CATHETERIZATION N/A 04/24/2016   Procedure: Lower Extremity Angiography;  Surgeon: Lorretta Harp, MD;  Location: [facility];  Service: Cardiovascular;  Laterality: N/A;  . PERIPHERAL VASCULAR CATHETERIZATION  04/24/2016   Procedure: Peripheral Vascular Intervention;  Surgeon: Lorretta Harp, MD;  Location: [facility];  Service: Cardiovascular;;  Aorta  . retrograde central aortic catheterization  05/19/2005   cutting balloon atherectomy, c-circ stenosis with DES STENTING CYPHER  . TONSILLECTOMY    . TOTAL KNEE ARTHROPLASTY Left 01/03/2019   Procedure: TOTAL KNEE ARTHROPLASTY;  Surgeon: Netta Cedars, MD;  Location: [facility];  Service: Orthopedics;  Laterality: Left;  with IS block  . TRANSURETHRAL RESECTION OF PROSTATE N/A 09/08/2016   Procedure: TRANSURETHRAL RESECTION OF THE PROSTATE (TURP);  Surgeon: Ardis Hughs, MD;  Location: [facility];  Service: Urology;  Laterality: N/A;  . TRANSURETHRAL RESECTION OF PROSTATE     10-10-17  Dr. Louis Meckel  . TRANSURETHRAL RESECTION OF PROSTATE N/A 10/10/2017   Procedure: TRANSURETHRAL RESECTION OF THE PROSTATE (TURP);  Surgeon: Ardis Hughs, MD;  Location: [facility];  Service: Urology;  Laterality: N/A;  . VASECTOMY      reports that he quit smoking about 50 years ago. His smoking use included cigarettes. He has a 40.00 pack-year smoking history. He has never used smokeless tobacco. He reports current alcohol use of about 4.0 standard drinks of alcohol per week. He  reports that he does not use drugs. Social History   Socioeconomic History  . Marital status: Married    Spouse name: [name]  . Number of children: Not on file  . Years of education: BA  . Highest education level: Not on file  Occupational History  . Occupation: Retired  Scientific laboratory technician  . Financial resource strain: Not on file  . Food insecurity    Worry: Not on file    Inability: Not on file  . Transportation needs    Medical: Not on file    Non-medical: Not on file  Tobacco Use  . Smoking status: Former Smoker    Packs/day: 2.00    Years: 20.00    Pack years: 40.00    Types: Cigarettes    Quit date: 07/31/1968    Years since quitting: 50.4  . Smokeless tobacco: Never Used  Substance and Sexual Activity  . Alcohol use: Yes    Alcohol/week: 4.0 standard drinks  Types: 4 Glasses of wine per week  . Drug use: No  . Sexual activity: Not Currently  Lifestyle  . Physical activity    Days per week: Not on file    Minutes per session: Not on file  . Stress: Not on file  Relationships  . Social Herbalist on phone: Not on file    Gets together: Not on file    Attends religious service: Not on file    Active member of club or organization: Not on file    Attends meetings of clubs or organizations: Not on file    Relationship status: Not on file  . Intimate partner violence    Fear of current or ex partner: Not on file    Emotionally abused: Not on file    Physically abused: Not on file    Forced sexual activity: Not on file  Other Topics Concern  . Not on file  Social History Narrative   Lives with wife   Caffeine use: Decaf coffee, tea   Left handed    Functional Status Survey:    Family History  Problem Relation Age of Onset  . Ovarian cancer Sister   . Heart disease Father   . Brain cancer Brother        Tumor    Health Maintenance  Topic Date Due  . TETANUS/TDAP  03/27/1952  . PNA vac Low Risk Adult (1 of 2 - PCV13) 03/27/1998  . INFLUENZA  VACCINE  03/01/2019    Allergies  Allergen Reactions  . Lisinopril Cough  . Niaspan [Niacin Er] Rash    Outpatient Encounter Medications as of 01/10/2019  Medication Sig  . acetaminophen (TYLENOL) 500 MG tablet Take 500 mg by mouth every 6 (six) hours as needed.  [name] amLODipine (NORVASC) 5 MG tablet Take 5 mg by mouth daily.    [name] Apoaequorin (PREVAGEN PO) Take 1 tablet by mouth daily.  [name] aspirin EC 81 MG tablet Take 1 tablet (81 mg total) by mouth 2 (two) times a day.  [name] atenolol (TENORMIN) 50 MG tablet Take 50 mg by mouth daily.    [name] atorvastatin (LIPITOR) 40 MG tablet Take 40 mg by mouth daily at 6 PM.   . benzonatate (TESSALON) 100 MG capsule Take 100 mg by mouth at bedtime as needed for cough.   . Biotin 10 MG CAPS Take 10 mg by mouth daily.  [name] bismuth subsalicylate (PEPTO BISMOL) 262 MG/15ML suspension Take 30 mLs by mouth every 6 (six) hours as needed for indigestion.  . cholecalciferol (VITAMIN D3) 25 MCG (1000 UT) tablet Take 1,000 Units by mouth daily.  . clopidogrel (PLAVIX) 75 MG tablet Take 75 mg by mouth every evening.   . diazepam (VALIUM) 5 MG tablet Take 2.5 mg by mouth daily as needed for anxiety.   . diphenhydrAMINE (BENADRYL) 25 MG tablet Take 25 mg by mouth every evening.  . hydrochlorothiazide 25 MG tablet Take 25 mg by mouth daily.    [name] losartan (COZAAR) 50 MG tablet Take 50 mg by mouth every evening.   . Magnesium 250 MG TABS Take 250 mg by mouth at bedtime.  . Misc Natural Products (GLUCOSAMINE CHONDROITIN TRIPLE) TABS Take 2 tablets by mouth daily.   . Multiple Vitamin (MULTIVITAMIN WITH MINERALS) TABS tablet Take 1 tablet by mouth daily.  [name] oxyCODONE (OXY IR/ROXICODONE) 5 MG immediate release tablet Take 10 mg by mouth every 4 (four) hours as needed for severe pain.  [name] oxyCODONE (  OXY IR/ROXICODONE) 5 MG immediate release tablet Take 5 mg by mouth every 4 (four) hours as needed for moderate pain.  . pantoprazole (PROTONIX) 40 MG tablet TAKE 1 TABLET BY MOUTH TWICE  A DAY  . potassium chloride SA (K-DUR,KLOR-CON) 20 MEQ tablet Take 20 mEq by mouth every evening.   . triamcinolone cream (KENALOG) 0.1 % Apply 1 application topically 2 (two) times daily. To back rash  . vitamin C (ASCORBIC ACID) 500 MG tablet Take 500 mg by mouth daily.  . [DISCONTINUED] OVER THE COUNTER MEDICATION Apply 1 application topically daily. Hemp oil cream  . [DISCONTINUED] oxyCODONE (OXY IR/ROXICODONE) 5 MG immediate release tablet Take 1-2 tablets (5-10 mg total) by mouth every 4 (four) hours as needed for moderate pain (pain score 4-6).   No facility-administered encounter medications on file as of 01/10/2019.     Review of Systems  Constitutional: Positive for appetite change. Negative for activity change, chills and fever.       Says appetite poor  HENT: Negative for congestion.   Eyes:       Glasses  Respiratory: Negative for cough and shortness of breath.   Cardiovascular: Negative for chest pain, palpitations and leg swelling.  Gastrointestinal: Negative for abdominal pain and constipation.  Genitourinary: Negative for dysuria.  Musculoskeletal: Positive for arthralgias and gait problem.  Neurological: Negative for dizziness and weakness.  Hematological: Bruises/bleeds easily.  Psychiatric/Behavioral: Positive for confusion. Negative for agitation, behavioral problems, hallucinations and sleep disturbance. The patient is not nervous/anxious.        Is upset his wife cannot visit--says if he knew it would be like this for his surgery, he wouldn't have done it    Vitals:   01/10/19 1147  BP: (!) 175/62  Pulse: 69  Resp: 20  Temp: 98.7 F (37.1 C)  TempSrc: Oral  SpO2: 95%  Weight: 165 lb (74.8 kg)  Height: 5\' 4"  (1.626 m)   Body mass index is 28.32 kg/m. Physical Exam Vitals signs and nursing note reviewed.  Constitutional:      General: He is not in acute distress.    Appearance: He is not toxic-appearing.     Comments: lethargic  HENT:     Head:  Normocephalic.     Right Ear: External ear normal.     Left Ear: External ear normal.     Nose: Nose normal.     Mouth/Throat:     Pharynx: Oropharynx is clear.  Eyes:     Extraocular Movements: Extraocular movements intact.     Conjunctiva/sclera: Conjunctivae normal.     Pupils: Pupils are equal, round, and reactive to light.     Comments: glasses  Cardiovascular:     Rate and Rhythm: Normal rate and regular rhythm.     Pulses: Normal pulses.     Heart sounds: Normal heart sounds.  Pulmonary:     Effort: Pulmonary effort is normal. No respiratory distress.     Breath sounds: Normal breath sounds. No wheezing, rhonchi or rales.  Abdominal:     General: Bowel sounds are normal. There is no distension.     Palpations: Abdomen is soft. There is no mass.     Tenderness: There is no abdominal tenderness.  Musculoskeletal:     Comments: Very mild swelling left vs right leg; dressing intact to incision, minimal ecchymoses medial to dressing on left knee; really struggled to stand with nurse using walker and to transfer to wheelchair  Skin:    General: Skin is  warm and dry.  Neurological:     General: No focal deficit present.     Cranial Nerves: No cranial nerve deficit.     Motor: Weakness present.     Gait: Gait abnormal.     Comments: Falling asleep, history limited by his lethargy  Psychiatric:     Comments: Mood is down     Labs reviewed: Basic Metabolic Panel: Recent Labs    12/27/18 1019 01/04/19 0331 01/06/19 0853  NA 142 132* 140  K 4.5 3.4* 3.4*  CL 109 101 105  CO2 27 27 28   GLUCOSE 110* 135* 136*  BUN 19 20 19   CREATININE 0.71 0.68 0.91  CALCIUM 9.5 8.6* 8.3*   Liver Function Tests: No results for input(s): AST, ALT, ALKPHOS, BILITOT, PROT, ALBUMIN in the last 8760 hours. No results for input(s): LIPASE, AMYLASE in the last 8760 hours. No results for input(s): AMMONIA in the last 8760 hours. CBC: Recent Labs    12/27/18 1019 01/04/19 0331 01/05/19  0350  WBC 6.6 14.6* 13.3*  HGB 11.7* 10.6* 10.7*  HCT 37.7* 33.5* 35.3*  MCV 85.3 85.2 85.3  PLT 228 223 205   Cardiac Enzymes: No results for input(s): CKTOTAL, CKMB, CKMBINDEX, TROPONINI in the last 8760 hours. BNP: Invalid input(s): POCBNP Lab Results  Component Value Date   HGBA1C 5.5 11/23/2016   Lab Results  Component Value Date   TSH 1.95 04/12/2016   Lab Results  Component Value Date   [id number] 226 02/13/2017   No results found for: FOLATE No results found for: IRON, TIBC, FERRITIN  Imaging and Procedures obtained prior to SNF admission: Reviewed hospital records and op report  Assessment/Plan 1. Localized osteoarthritis of left knee -was refractory to conservative measures, now s/p TKA which appears to be doing well -seems pains are more positional from back and hip  2. S/P total knee arthroplasty, left -keep planned f/u with Dr. Veverly Fells -cont PT, OT  3. Delirium -due to pain meds, change in environment, separation from wife, pain -avoid using benzo due to confusion and fall risk from this in older adults -stopped benadryl which is unsafe in older adults  4. Essential hypertension -bps elevated--appears secondary to pain -need to improve pain control without worsening #3--may consider change to hydrocodone 10mg --some people do better with it than oxycodone  5. HYPERCHOLESTEROLEMIA -cont home lipitor therapy  6. Atherosclerosis of native coronary artery of native heart without angina pectoris -no current symptoms, cont secondary preventive measures   Note:  Reduced diazepam to 2.5mg  qhs prn--would like to stop completely.  Also put time limit on pain medication and changed so he did not have multiple different orders for percocet and oxycodone upon admission.  Stopped benadryl.  Also put time limit on bid asa 81mg  to 6 wks max.  Also on plavix.  Family/ staff Communication: discussed with SNF nurse  Labs/tests ordered:  PT, OT, Needs f/u cbc, bmp in  a couple of weeks, ortho f/u  Jabriel Vanduyne L. Adriana Quinby, D.O. [facility] [address], [place] [address] Cell Phone (Mon-Fri 8am-5pm):  [phone] On Call:  [phone] & follow prompts after 5pm & weekends Office Phone:  [phone] Office Fax:  [phone]

## 2019-01-13 ENCOUNTER — Other Ambulatory Visit: Payer: Self-pay | Admitting: Adult Health

## 2019-01-13 MED ORDER — HYDROCODONE-ACETAMINOPHEN 5-325 MG PO TABS: 1.0000 | ORAL_TABLET | Freq: Three times a day (TID) | ORAL | 0 refills | Status: AC | PRN

## 2019-01-13 MED ORDER — ACETAMINOPHEN 500 MG PO TABS: 500.0000 mg | ORAL_TABLET | Freq: Two times a day (BID) | ORAL | 2 refills | Status: DC | PRN

## 2019-01-13 NOTE — Progress Notes (Signed)
He continues to report pain 10/10 to his post op knee. He is seeing orthopedics on 6/16.  He has experienced delirium with the use of oxycodone. After consultation with Dr. Mariea Clonts we decided to change to hydrocodone/apap and monitor for improvement in cognition. He should not exceed 3 grams of tylenol in 24 hrs.

## 2019-01-16 ENCOUNTER — Other Ambulatory Visit: Payer: Self-pay

## 2019-01-16 NOTE — Progress Notes (Signed)
Notes recorded by Lorretta Harp, MD on 12/26/2018 at 3:03 PM EDT  Slight progression of left ICA stenosis. Repeat 12 months  Carotid stenosis f/u.

## 2019-02-04 ENCOUNTER — Ambulatory Visit: Payer: Medicare Other | Admitting: Neurology

## 2019-02-20 ENCOUNTER — Ambulatory Visit (INDEPENDENT_AMBULATORY_CARE_PROVIDER_SITE_OTHER): Payer: Medicare Other | Admitting: Podiatry

## 2019-02-20 ENCOUNTER — Encounter: Payer: Self-pay | Admitting: Podiatry

## 2019-02-20 ENCOUNTER — Other Ambulatory Visit: Payer: Self-pay

## 2019-02-20 VITALS — Temp 97.9°F

## 2019-02-20 DIAGNOSIS — M79676 Pain in unspecified toe(s): Secondary | ICD-10-CM | POA: Diagnosis not present

## 2019-02-20 DIAGNOSIS — M722 Plantar fascial fibromatosis: Secondary | ICD-10-CM | POA: Diagnosis not present

## 2019-02-20 DIAGNOSIS — B351 Tinea unguium: Secondary | ICD-10-CM | POA: Diagnosis not present

## 2019-02-20 NOTE — Progress Notes (Signed)
He presents today for follow-up of his bilateral plantar fasciitis.  States that the left one is doing fine the right one is still painful.  Objective: Vital signs are stable he is alert and oriented x3 there is no erythema edema cellulitis drainage odor he has no pain on palpation medial calcaneal tubercle of the left foot only of the right foot.  Assessment: Plantar fasciitis right resolve plantar fasciitis left.  Plan: Reinjected the right heel today with 20 mg of Kenalog 5 mg Marcaine.  Tolerated procedure well.  Follow-up with him in 1 month.

## 2019-03-19 DIAGNOSIS — L570 Actinic keratosis: Secondary | ICD-10-CM | POA: Diagnosis not present

## 2019-03-19 DIAGNOSIS — D692 Other nonthrombocytopenic purpura: Secondary | ICD-10-CM | POA: Diagnosis not present

## 2019-03-19 DIAGNOSIS — L820 Inflamed seborrheic keratosis: Secondary | ICD-10-CM | POA: Diagnosis not present

## 2019-03-19 DIAGNOSIS — L821 Other seborrheic keratosis: Secondary | ICD-10-CM | POA: Diagnosis not present

## 2019-04-30 ENCOUNTER — Emergency Department (HOSPITAL_COMMUNITY): Payer: Medicare Other

## 2019-04-30 ENCOUNTER — Other Ambulatory Visit: Payer: Self-pay

## 2019-04-30 ENCOUNTER — Emergency Department (HOSPITAL_COMMUNITY)
Admission: EM | Admit: 2019-04-30 | Discharge: 2019-04-30 | Disposition: A | Discharge status: Home / Self Care | Payer: Medicare Other | Attending: Emergency Medicine | Admitting: Emergency Medicine

## 2019-04-30 ENCOUNTER — Encounter (HOSPITAL_COMMUNITY): Payer: Self-pay | Admitting: Emergency Medicine

## 2019-04-30 DIAGNOSIS — Y92488 Other paved roadways as the place of occurrence of the external cause: Secondary | ICD-10-CM | POA: Diagnosis not present

## 2019-04-30 DIAGNOSIS — Y939 Activity, unspecified: Secondary | ICD-10-CM | POA: Diagnosis not present

## 2019-04-30 DIAGNOSIS — I2510 Atherosclerotic heart disease of native coronary artery without angina pectoris: Secondary | ICD-10-CM | POA: Diagnosis not present

## 2019-04-30 DIAGNOSIS — Z79899 Other long term (current) drug therapy: Secondary | ICD-10-CM | POA: Insufficient documentation

## 2019-04-30 DIAGNOSIS — Z87891 Personal history of nicotine dependence: Secondary | ICD-10-CM | POA: Insufficient documentation

## 2019-04-30 DIAGNOSIS — M25531 Pain in right wrist: Secondary | ICD-10-CM | POA: Diagnosis not present

## 2019-04-30 DIAGNOSIS — R42 Dizziness and giddiness: Secondary | ICD-10-CM | POA: Insufficient documentation

## 2019-04-30 DIAGNOSIS — Z7982 Long term (current) use of aspirin: Secondary | ICD-10-CM | POA: Insufficient documentation

## 2019-04-30 DIAGNOSIS — W010XXA Fall on same level from slipping, tripping and stumbling without subsequent striking against object, initial encounter: Secondary | ICD-10-CM | POA: Diagnosis not present

## 2019-04-30 DIAGNOSIS — I10 Essential (primary) hypertension: Secondary | ICD-10-CM | POA: Diagnosis not present

## 2019-04-30 DIAGNOSIS — S0990XA Unspecified injury of head, initial encounter: Secondary | ICD-10-CM | POA: Diagnosis present

## 2019-04-30 DIAGNOSIS — W19XXXA Unspecified fall, initial encounter: Secondary | ICD-10-CM

## 2019-04-30 DIAGNOSIS — Y999 Unspecified external cause status: Secondary | ICD-10-CM | POA: Insufficient documentation

## 2019-04-30 DIAGNOSIS — S0081XA Abrasion of other part of head, initial encounter: Secondary | ICD-10-CM | POA: Insufficient documentation

## 2019-04-30 DIAGNOSIS — S12000A Unspecified displaced fracture of first cervical vertebra, initial encounter for closed fracture: Secondary | ICD-10-CM | POA: Insufficient documentation

## 2019-04-30 DIAGNOSIS — Z7901 Long term (current) use of anticoagulants: Secondary | ICD-10-CM | POA: Diagnosis not present

## 2019-04-30 DIAGNOSIS — S12001A Unspecified nondisplaced fracture of first cervical vertebra, initial encounter for closed fracture: Secondary | ICD-10-CM

## 2019-04-30 NOTE — ED Notes (Signed)
[facility] called, report given to Katharine Look, Therapist, sports. [facility] coming to pick up patient.

## 2019-04-30 NOTE — ED Notes (Signed)
Pt discharge paperwork reviewed with patient. Pt verbalized understanding of all discharge paperwork. Pt unable to sign due to signature pad not responding. Pt discharged.

## 2019-04-30 NOTE — ED Notes (Signed)
Patient transported to CT 

## 2019-04-30 NOTE — ED Provider Notes (Addendum)
[facility] [place] [facility] EMERGENCY DEPARTMENT Provider Note   CSN: [id number] Arrival date & time: 04/30/19  1106     History   Chief Complaint Chief Complaint  Patient presents with   Fall    HPI Nathan Santiago is a 85 y.o. male history of vertigo, hypertension, HLD, CAD presenting for fall and head trauma just prior to arrival.  Patient fell from standing off of curb onto his face with his right hand underneath him and scraping his right knee.  Patient states that he has long history of vertigo that is usually well controlled with meclizine but has become persistent over the past 2 weeks.  Patient denies loss of consciousness, visual changes, difficulty speaking, feeling disoriented or confused.  Patient denies any pain currently and states that he is only here because he was made to come by the assisted living facility.  Patient is on Plavix and aspirin for his coronary artery disease s/p multiple stents.       HPI  Past Medical History:  Diagnosis Date   AAA (abdominal aortic aneurysm) ([place])    asymptomatic   AAA (abdominal aortic aneurysm) ([place]) 2010   peripheral  angiogram-- bilateral SFA DISEASE  and 60 to 70% infrarenaal abd. aortic stenosis with 15 -mm gradient   Adenomatous colon polyp 11/2003   CAD (coronary artery disease)    Gait abnormality 02/13/2017   Gastroparesis    pt denies   GERD (gastroesophageal reflux disease)    w/ LPR   History of kidney stones    x2   Hyperlipidemia    Hypertension    Peripheral arterial disease ([place])    RCEA  by Dr Lemar Livings   PUD (peptic ulcer disease)    pt unaware   Vertigo     Patient Active Problem List   Diagnosis Date Noted   Status post total knee replacement, left 01/03/2019   Pain in left knee 08/05/2018   Pain in right knee 08/05/2018   Chronic cough 04/15/2018   Foreign body in ear 12/21/2017   Hematuria 10/10/2017   Gait abnormality 02/13/2017   UTI (urinary tract  infection) 11/22/2016   Vertigo 11/22/2016   Hypokalemia 11/22/2016   Urinary frequency 09/08/2016   Claudication ([place]) 04/24/2016   Sensorineural hearing loss (SNHL), bilateral 03/09/2016   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   Bilateral carotid artery disease ([place]) 08/07/2014   Peripheral arterial disease ([place]) 08/06/2013   GASTROPARESIS 02/25/2009   COLONIC POLYPS, ADENOMATOUS, HX OF 02/22/2009   HYPERCHOLESTEROLEMIA 09/26/2007   Essential hypertension 09/26/2007   Coronary atherosclerosis 09/26/2007   Allergic rhinitis 09/26/2007   GERD 09/26/2007   Peptic ulcer [id number]   UMBILICAL HERNIA [id number]   NEPHROLITHIASIS 09/26/2007   BLADDER STONE 09/26/2007    Past Surgical History:  Procedure Laterality Date   ABDOMINAL AORTOGRAM W/LOWER EXTREMITY Bilateral 08/05/2018   Procedure: ABDOMINAL AORTOGRAM W/LOWER EXTREMITY;  Surgeon: Lorretta Harp, MD;  Location: [facility];  Service: Cardiovascular;  Laterality: Bilateral;   AORTOGRAM  04/24/2016    Abdominal aortogram, bilateral iliac angiogram, bifemoral runoff   CARDIAC CATHETERIZATION  05/12/2005   RCA   carotid doppler  01/24/2013   RICA endarterectomy,left CCA 0-49%; left bulb and prox ICA 50-69%; bilaateral subclavian < 50%   CAROTID ENDARTERECTOMY  11/01/2010   CATARACT EXTRACTION     COLONOSCOPY     CORONARY ANGIOPLASTY  05/18/2005   2 STENTS distal RCA AND PROXIMAL-MID RCA   5 total stents per pt.  CYSTOSCOPY/URETEROSCOPY/HOLMIUM LASER/STENT PLACEMENT Left 01/11/2018   Procedure: LEFT URETEROSCOPY/HOLMIUM LASER/STENT PLACEMENT;  Surgeon: Ardis Hughs, MD;  Location: [facility];  Service: Urology;  Laterality: Left;   DOPPLER ECHOCARDIOGRAPHY  02/07/2012   EF 55%,SHOWED NO ISCHEMIA    HERNIA REPAIR     umbilical   lower arterial  doppler  02/04/2013   aotra 1.5 x 1.5 cm; distal abd aorta 70-99%,proximal common iliac arteries -very stenotic with increased  velocities>50%,may be falsely elevated as a result of residual plaque from the distal aorta stenosis   lower extremity doppler  June 18 ,2013   ABI'S ABNORMAL, RABI was 0.88 and LABI 0.75 ,with 3-vessel  run off   NM MYOVIEW LTD  MAY 23,2011   showed no significant ischemia;   NM MYOVIEW LTD  04/22/2008   ef 77%,exercise capcity 6 METS ,exaggerated blood pressure response to exercise   PERIPHERAL VASCULAR CATHETERIZATION N/A 04/24/2016   Procedure: Lower Extremity Angiography;  Surgeon: Lorretta Harp, MD;  Location: [facility];  Service: Cardiovascular;  Laterality: N/A;   PERIPHERAL VASCULAR CATHETERIZATION  04/24/2016   Procedure: Peripheral Vascular Intervention;  Surgeon: Lorretta Harp, MD;  Location: [facility];  Service: Cardiovascular;;  Aorta   retrograde central aortic catheterization  05/19/2005   cutting balloon atherectomy, c-circ stenosis with DES STENTING CYPHER   TONSILLECTOMY     TOTAL KNEE ARTHROPLASTY Left 01/03/2019   Procedure: TOTAL KNEE ARTHROPLASTY;  Surgeon: Netta Cedars, MD;  Location: [facility];  Service: Orthopedics;  Laterality: Left;  with IS block   TRANSURETHRAL RESECTION OF PROSTATE N/A 09/08/2016   Procedure: TRANSURETHRAL RESECTION OF THE PROSTATE (TURP);  Surgeon: Ardis Hughs, MD;  Location: [facility];  Service: Urology;  Laterality: N/A;   TRANSURETHRAL RESECTION OF PROSTATE     10-10-17  Dr. Louis Meckel   TRANSURETHRAL RESECTION OF PROSTATE N/A 10/10/2017   Procedure: TRANSURETHRAL RESECTION OF THE PROSTATE (TURP);  Surgeon: Ardis Hughs, MD;  [facility];  Service: Urology;  Laterality: N/A;   VASECTOMY          Home Medications    Prior to Admission medications   Medication Sig Start Date End Date Taking? Authorizing Provider  acetaminophen (TYLENOL) 500 MG tablet Take 1 tablet (500 mg total) by mouth 2 (two) times daily as needed. 01/13/19   Royal Hawthorn, NP  amLODipine (NORVASC) 5 MG tablet Take 5 mg by  mouth daily.      [provider]  Apoaequorin (PREVAGEN PO) Take 1 tablet by mouth daily.    [provider]  aspirin EC 81 MG tablet Take 1 tablet (81 mg total) by mouth 2 (two) times a day. 01/03/19   Netta Cedars, MD  atenolol (TENORMIN) 50 MG tablet Take 50 mg by mouth daily.      [provider]  atorvastatin (LIPITOR) 40 MG tablet Take 40 mg by mouth daily at 6 PM.  11/14/16   [provider]  benzonatate (TESSALON) 100 MG capsule Take 100 mg by mouth at bedtime as needed for cough.  08/02/14   [provider]  Biotin 10 MG CAPS Take 10 mg by mouth daily.    [provider]  bismuth subsalicylate (PEPTO BISMOL) 262 MG/15ML suspension Take 30 mLs by mouth every 6 (six) hours as needed for indigestion.    [provider]  cholecalciferol (VITAMIN D3) 25 MCG (1000 UT) tablet Take 1,000 Units by mouth daily.    [provider]  clopidogrel (  PLAVIX) 75 MG tablet Take 75 mg by mouth every evening.     [provider]  hydrochlorothiazide 25 MG tablet Take 25 mg by mouth daily.      [provider]  HYDROcodone-acetaminophen (NORCO/VICODIN) 5-325 MG tablet Take 1-2 tablets by mouth every 8 (eight) hours as needed. Take one tab for moderate pain and two tabs for severe pain. 01/13/19 01/13/20  Royal Hawthorn, NP  losartan (COZAAR) 50 MG tablet Take 50 mg by mouth every evening.     [provider]  Magnesium 250 MG TABS Take 250 mg by mouth at bedtime.    [provider]  Misc Natural Products (GLUCOSAMINE CHONDROITIN TRIPLE) TABS Take 2 tablets by mouth daily.     [provider]  Multiple Vitamin (MULTIVITAMIN WITH MINERALS) TABS tablet Take 1 tablet by mouth daily.    [provider]  pantoprazole (PROTONIX) 40 MG tablet TAKE 1 TABLET BY MOUTH TWICE A DAY 10/21/18   Byrum, Rose Fillers, MD  potassium chloride SA (K-DUR,KLOR-CON) 20 MEQ tablet Take 20 mEq by mouth every evening.   07/16/13   [provider]  traMADol Veatrice Bourbon) 50 MG tablet  01/16/19   [provider]  triamcinolone cream (KENALOG) 0.1 % Apply 1 application topically 2 (two) times daily. To back rash    [provider]  vitamin C (ASCORBIC ACID) 500 MG tablet Take 500 mg by mouth daily.    [provider]    Family History Family History  Problem Relation Age of Onset   Ovarian cancer Sister    Heart disease Father    Brain cancer Brother        Tumor    Social History Social History   Tobacco Use   Smoking status: Former Smoker    Packs/day: 2.00    Years: 20.00    Pack years: 40.00    Types: Cigarettes    Quit date: 07/31/1968    Years since quitting: 50.7   Smokeless tobacco: Never Used  Substance Use Topics   Alcohol use: Yes    Alcohol/week: 4.0 standard drinks    Types: 4 Glasses of wine per week   Drug use: No     Allergies   Lisinopril and Niaspan [niacin er]   Review of Systems Review of Systems  All other systems reviewed and are negative.    Physical Exam Updated Vital Signs BP (!) 164/78    Pulse 84    Resp 14    Ht 5\' 4"  (1.626 m)    Wt 69.4 kg    SpO2 96%    BMI 26.26 kg/m   Physical Exam Vitals signs and nursing note reviewed.  Constitutional:      General: He is not in acute distress. HENT:     Head: Normocephalic and atraumatic.     Comments: Skin abrasions over bridge of nose, left side of forehead is with multiple abrasions and skin tears and swelling.  Abrasions are bleeding well controlled with gauze.    Ears:     Comments: No hemotympanum    Nose:     Comments: Abrasions over bridge of nose, no clear fluid drainage, no epistaxis Eyes:     General: No scleral icterus. Neck:     Musculoskeletal: Normal range of motion.     Comments: No midline or paravertebral tenderness.  Full range of motion. Cardiovascular:     Rate and Rhythm: Normal rate and regular rhythm.     Pulses: Normal  pulses.     Heart  sounds: Normal heart sounds.  Pulmonary:     Effort: Pulmonary effort is normal. No respiratory distress.     Breath sounds: No wheezing.  Abdominal:     Palpations: Abdomen is soft.     Tenderness: There is no abdominal tenderness.  Musculoskeletal:     Right lower leg: No edema.     Left lower leg: No edema.     Comments: Right knee with small abrasion, no swelling, no tenderness, no deformity. Strength 5/5 with flexion and extention  Right wrist with full range of motion, multiple abrasions and large skin tear over dorsum of hand.  No step-off deformity or swelling of metacarpals or phalanges or distal radius.  Bruising present over dorsum of hand.  Skin:    General: Skin is warm and dry.     Capillary Refill: Capillary refill takes less than 2 seconds.  Neurological:     Mental Status: He is alert and oriented to person, place, and time. Mental status is at baseline.     Sensory: No sensory deficit.     Motor: No weakness.     Comments: Alert and oriented to self, place, time and event.  Speech is fluent, clear without dysarthria or dysphasia. Strength 5/5 in upper/lower extremities  Sensation intact in upper/lower extremities  Hesitant gait, shuffling. Negative Romberg. No pronator drift.  Normal finger-to-nose and feet tapping.  CN I not tested  CN II grossly intact visual fields bilaterally. Did not visualize posterior eye.   CN III, IV, VI PERRLA and EOMs intact bilaterally  CN V Intact sensation to sharp and light touch to the face  CN VII facial movements symmetric  CN VIII not tested  CN IX, X no uvula deviation, symmetric rise of soft palate  CN XI 5/5 SCM and trapezius strength bilaterally  CN XII Midline tongue protrusion, symmetric L/R movements   Psychiatric:        Mood and Affect: Mood normal.        Behavior: Behavior normal.      ED Treatments / Results  Labs (all labs ordered are listed, but only abnormal results are displayed) Labs Reviewed - No  data to display  EKG EKG Interpretation  Date/Time:  Wednesday April 30 2019 11:10:53 EDT Ventricular Rate:  89 PR Interval:    QRS Duration: 86 QT Interval:  369 QTC Calculation: 449 R Axis:   65 Text Interpretation:  Sinus rhythm Confirmed by Quintella Reichert [phone]) on 04/30/2019 12:03:23 PM   Radiology Dg Wrist Complete Right  Result Date: 04/30/2019 CLINICAL DATA:  Right wrist pain after fall EXAM: RIGHT WRIST - COMPLETE 3+ VIEW COMPARISON:  None. FINDINGS: No acute fracture or dislocation. Moderate degenerative changes at the radiocarpal joint, first [facility] joint, and triscaphe joint. Well corticated ossification dorsal to the proximal carpal row, likely sequela of remote trauma. No focal soft tissue swelling. Advanced vascular calcifications. IMPRESSION: No acute osseous abnormality, right wrist. Electronically Signed   By: Davina Poke M.D.   On: 04/30/2019 13:18   Ct Head Wo Contrast  Result Date: 04/30/2019 CLINICAL DATA:  Pt reports he was trying to get into his car when he slipped. Pt reports falling face first onto concrete. Pt reports no loss of consciousness. Pt presents with hematoma to left forehead and abrasions to noseAtaxia, post head trauma; C-spine trauma, high clinical risk (NEXUS/CCR); Facial trauma, fx suspected EXAM: CT HEAD WITHOUT CONTRAST CT MAXILLOFACIAL WITHOUT CONTRAST CT CERVICAL SPINE WITHOUT  CONTRAST TECHNIQUE: Multidetector CT imaging of the head, cervical spine, and maxillofacial structures were performed using the standard protocol without intravenous contrast. Multiplanar CT image reconstructions of the cervical spine and maxillofacial structures were also generated. COMPARISON:  None. FINDINGS: CT HEAD FINDINGS Brain: No intracranial hemorrhage. No parenchymal contusion. No midline shift or mass effect. Basilar cisterns are patent. No skull base fracture. No fluid in the paranasal sinuses or mastoid air cells. Orbits are normal. There are  periventricular and subcortical white matter hypodensities. Generalized cortical atrophy. Vascular: No hyperdense vessel or unexpected calcification. Skull: No skull fracture. Other: Very shallow scalp hematoma over the anterior LEFT frontal lobe. CT MAXILLOFACIAL FINDINGS Osseous: Orbital rims are intact. Zygomatic arches are normal. Maxillary sinus walls intact. Pterygoid plates are normal. No nasal bone fracture. Endo condyles are located.  No mandibular fracture Orbits: No propped ptosis. Intraconal contents normal. Globes are normal. Sinuses: No fluid in the paranasal sinuses. Soft tissues: Unremarkable. CT CERVICAL SPINE FINDINGS Alignment: Normal alignment of the cervical vertebral bodies. Mild anterolisthesis of C4 on C5 by 2 mm is favored degenerative. Skull base and vertebrae: Normal craniocervical junction. No loss of vertebral body height or disc height. Normal facet articulation. Nondisplaced fracture of the neural arch of C1. Fracture on the LEFT at the junction with the pedicle (image 19/7, image 26/9). No clear evidence of adjacent soft tissue injury. Soft tissues and spinal canal: No prevertebral soft tissue swelling. No perispinal or epidural hematoma. Disc levels: Disc space narrowing at C5-C6 with associated endplate osteophytosis. Multiple levels of facet hypertrophy. Upper chest: Clear Other: None IMPRESSION: 1. No intracranial trauma. Small frontal scalp hematoma. No fracture 2. 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 3. No facial bone fracture. 4. NONDISPLACED FRACTURE OF THE NEURAL ARCH AT C1. Fracture on the LEFT adjacent to the pedicle. No clear soft tissue injury identified by CT. Fracture is age indeterminate including acute or subacute versus chronic fracture. Consider MRI of the cervical spine to look for marrow edema and associated soft tissue injury which could help determine chronicity. 5. No additional evidence of cervical spine fracture. 6. Mild disc  osteophytic disease in the lower cervical spine. Findings conveyed to [place], [facility] 04/30/2019  at15:01. Electronically Signed   By: Suzy Bouchard M.D.   On: 04/30/2019 15:01   Ct Cervical Spine Wo Contrast  Result Date: 04/30/2019 CLINICAL DATA:  Pt reports he was trying to get into his car when he slipped. Pt reports falling face first onto concrete. Pt reports no loss of consciousness. Pt presents with hematoma to left forehead and abrasions to noseAtaxia, post head trauma; C-spine trauma, high clinical risk (NEXUS/CCR); Facial trauma, fx suspected EXAM: CT HEAD WITHOUT CONTRAST CT MAXILLOFACIAL WITHOUT CONTRAST CT CERVICAL SPINE WITHOUT CONTRAST TECHNIQUE: Multidetector CT imaging of the head, cervical spine, and maxillofacial structures were performed using the standard protocol without intravenous contrast. Multiplanar CT image reconstructions of the cervical spine and maxillofacial structures were also generated. COMPARISON:  None. FINDINGS: CT HEAD FINDINGS Brain: No intracranial hemorrhage. No parenchymal contusion. No midline shift or mass effect. Basilar cisterns are patent. No skull base fracture. No fluid in the paranasal sinuses or mastoid air cells. Orbits are normal. There are periventricular and subcortical white matter hypodensities. Generalized cortical atrophy. Vascular: No hyperdense vessel or unexpected calcification. Skull: No skull fracture. Other: Very shallow scalp hematoma over the anterior LEFT frontal lobe. CT MAXILLOFACIAL FINDINGS Osseous: Orbital rims are intact. Zygomatic arches are normal. Maxillary sinus walls intact. Pterygoid  plates are normal. No nasal bone fracture. Endo condyles are located.  No mandibular fracture Orbits: No propped ptosis. Intraconal contents normal. Globes are normal. Sinuses: No fluid in the paranasal sinuses. Soft tissues: Unremarkable. CT CERVICAL SPINE FINDINGS Alignment: Normal alignment of the cervical vertebral bodies. Mild anterolisthesis of C4  on C5 by 2 mm is favored degenerative. Skull base and vertebrae: Normal craniocervical junction. No loss of vertebral body height or disc height. Normal facet articulation. Nondisplaced fracture of the neural arch of C1. Fracture on the LEFT at the junction with the pedicle (image 19/7, image 26/9). No clear evidence of adjacent soft tissue injury. Soft tissues and spinal canal: No prevertebral soft tissue swelling. No perispinal or epidural hematoma. Disc levels: Disc space narrowing at C5-C6 with associated endplate osteophytosis. Multiple levels of facet hypertrophy. Upper chest: Clear Other: None IMPRESSION: 1. No intracranial trauma. Small frontal scalp hematoma. No fracture 2. 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 3. No facial bone fracture. 4. NONDISPLACED FRACTURE OF THE NEURAL ARCH AT C1. Fracture on the LEFT adjacent to the pedicle. No clear soft tissue injury identified by CT. Fracture is age indeterminate including acute or subacute versus chronic fracture. Consider MRI of the cervical spine to look for marrow edema and associated soft tissue injury which could help determine chronicity. 5. No additional evidence of cervical spine fracture. 6. Mild disc osteophytic disease in the lower cervical spine. Findings conveyed to [place], [facility] 04/30/2019  at15:01. Electronically Signed   By: Suzy Bouchard M.D.   On: 04/30/2019 15:01   Ct Maxillofacial Wo Cm  Result Date: 04/30/2019 CLINICAL DATA:  Pt reports he was trying to get into his car when he slipped. Pt reports falling face first onto concrete. Pt reports no loss of consciousness. Pt presents with hematoma to left forehead and abrasions to noseAtaxia, post head trauma; C-spine trauma, high clinical risk (NEXUS/CCR); Facial trauma, fx suspected EXAM: CT HEAD WITHOUT CONTRAST CT MAXILLOFACIAL WITHOUT CONTRAST CT CERVICAL SPINE WITHOUT CONTRAST TECHNIQUE: Multidetector CT imaging of the head, cervical spine, and maxillofacial  structures were performed using the standard protocol without intravenous contrast. Multiplanar CT image reconstructions of the cervical spine and maxillofacial structures were also generated. COMPARISON:  None. FINDINGS: CT HEAD FINDINGS Brain: No intracranial hemorrhage. No parenchymal contusion. No midline shift or mass effect. Basilar cisterns are patent. No skull base fracture. No fluid in the paranasal sinuses or mastoid air cells. Orbits are normal. There are periventricular and subcortical white matter hypodensities. Generalized cortical atrophy. Vascular: No hyperdense vessel or unexpected calcification. Skull: No skull fracture. Other: Very shallow scalp hematoma over the anterior LEFT frontal lobe. CT MAXILLOFACIAL FINDINGS Osseous: Orbital rims are intact. Zygomatic arches are normal. Maxillary sinus walls intact. Pterygoid plates are normal. No nasal bone fracture. Endo condyles are located.  No mandibular fracture Orbits: No propped ptosis. Intraconal contents normal. Globes are normal. Sinuses: No fluid in the paranasal sinuses. Soft tissues: Unremarkable. CT CERVICAL SPINE FINDINGS Alignment: Normal alignment of the cervical vertebral bodies. Mild anterolisthesis of C4 on C5 by 2 mm is favored degenerative. Skull base and vertebrae: Normal craniocervical junction. No loss of vertebral body height or disc height. Normal facet articulation. Nondisplaced fracture of the neural arch of C1. Fracture on the LEFT at the junction with the pedicle (image 19/7, image 26/9). No clear evidence of adjacent soft tissue injury. Soft tissues and spinal canal: No prevertebral soft tissue swelling. No perispinal or epidural hematoma. Disc levels: Disc space narrowing  at C5-C6 with associated endplate osteophytosis. Multiple levels of facet hypertrophy. Upper chest: Clear Other: None IMPRESSION: 1. No intracranial trauma. Small frontal scalp hematoma. No fracture 2. Atrophy and white matter microvascular disease within  the brain. 3. No facial bone fracture. 4. NONDISPLACED FRACTURE OF THE NEURAL ARCH AT C1. Fracture on the LEFT adjacent to the pedicle. No clear soft tissue injury identified by CT. Fracture is age indeterminate including acute or subacute versus chronic fracture. Consider MRI of the cervical spine to look for marrow edema and associated soft tissue injury which could help determine chronicity. 5. No additional evidence of cervical spine fracture. 6. Mild disc osteophytic disease in the lower cervical spine. Findings conveyed to [place], [facility] 04/30/2019  at15:01. Electronically Signed   By: Suzy Bouchard M.D.   On: 04/30/2019 15:01    Procedures Procedures (including critical care time)  Medications Ordered in ED Medications - No data to display   Initial Impression / Assessment and Plan / ED Course  I have reviewed the triage vital signs and the nursing notes.  Pertinent labs & imaging results that were available during my care of the patient were reviewed by me and considered in my medical decision making (see chart for details).      Patient is a 70-year-old male with history of vertigo, hypertension, hyperlipidemia, CAD presenting with fall prior to arrival with head trauma and right wrist trauma on aspirin and Plavix.  Presentation concerning for distal radial fracture, cervical spine injury, head injury, intracranial bleed.  CT imaging shows no intracranial hemorrhage however there is a C1 nondisplaced fracture of the neural arch.  Consult with neurosurgery, recommendation is to send patient home with cervical collar and have patient follow-up in 1 to 2 weeks with outpatient neurology Dr. Ronnald Ramp.  Serial neurologic exams are without abnormal finding.  Patient has multiple abrasions over his face, forehead, wrists with skin avulsion.  Patient given bandages and cleaned.  Given instructions on wound care and need for follow-up with primary care for investigation of his worsening vertigo and  follow-up on emergency department visit.  Patient states he is in no pain at this time and at no point during ED visit.  Because of history of delirium with narcotic pain medication in the past recommended Tylenol ibuprofen for pain if it occurs.  4:16 PM consulted neurosurgery to discuss plan to discharge with neurosurgery follow up or MRI prior to discharge.  Per neurosurgery consult yesterday for follow-up appointment in next 1 to 2 weeks with Dr. Ronnald Ramp.   Given strict return precautions including any new or worrisome symptoms including but not limited to severe, persistent vertigo, weakness, new or worsening pain.  Patient given instructions to wear collar at all times.     The patient appears reasonably screened and/or stabilized for discharge and I doubt any other medical condition or other [facility] requiring further screening, evaluation, or treatment in the ED at this time prior to discharge.    Patient is hemodynamically stable, in NAD, and able to ambulate in the ED. Pain has been managed or a plan has been made for home management and has no complaints prior to discharge. Patient is comfortable with above plan and is stable for discharge at this time. All questions were answered prior to disposition. Results from the ER workup discussed with the patient face to face and all questions answered to the best of my ability.   The patient is safe for discharge with strict return precautions. Patient appears  safe for discharge with appropriate follow-up.  Conveyed my impression with the patient and he voiced understanding and is agreeable to plan.   An After Visit Summary was printed and given to the patient.  Portions of this note were generated with [employer]. Dictation errors may occur despite best attempts at proofreading.    Final Clinical Impressions(s) / ED Diagnoses   Final diagnoses:  Fall, initial encounter  Abrasion of face, initial encounter  Closed nondisplaced  fracture of first cervical vertebra, unspecified fracture morphology, initial encounter [facility])    ED Discharge Orders    None       Tedd Sias, [place] 04/30/19 [place], [place], [place] 04/30/19 1617    Quintella Reichert, MD 05/06/19 [phone]

## 2019-04-30 NOTE — Discharge Instructions (Addendum)
Call for follow-up appointment with your primary care doctor in 2-3 days for follow-up after today's visit to discuss your dizziness. Keep abrasions on face and right hand clean and covered with nonadhesive bandages.  Change dressings every morning and check wound for signs of infection.    CALL TO MAKE AN APPOINTMENT WITH DR. Ronnald Ramp TO BE SEEN IN THE NEXT 1-2 WEEKS. YOU MUST WEAR YOUR COLLAR UNTIL YOU ARE SEEN BY DOCTOR JONES.    Please return to ED or be seen by primary care for worsening or concerning symptoms.

## 2019-04-30 NOTE — ED Triage Notes (Signed)
PT BIB [facility] from [place]. Pt reports he was trying to get into his car when he slipped. Pt reports falling face first onto concrete. Pt reports no loss of consciousness. Pt presents with hematoma to left forehead and abrasions to nose, right hand and right knee. Pt A&Ox4. Pt is on Plavix. VSS. NAD.

## 2019-05-15 ENCOUNTER — Ambulatory Visit: Payer: Medicare Other | Admitting: Podiatry

## 2019-05-15 ENCOUNTER — Encounter: Payer: Self-pay | Admitting: Podiatry

## 2019-05-15 ENCOUNTER — Other Ambulatory Visit: Payer: Self-pay

## 2019-05-15 DIAGNOSIS — M722 Plantar fascial fibromatosis: Secondary | ICD-10-CM | POA: Diagnosis not present

## 2019-05-15 NOTE — Progress Notes (Signed)
He presents today for follow-up of his plantar fasciitis right.  Objective: Vital signs are stable alert and oriented x3.  Pulses are palpable.  He has pain to palpation medial cuticle of the right heel.  Assessment: Plantar fasciitis right.  Plan: Injected the right heel today with 20 mg Kenalog 5 mg Marcaine point maximal tenderness.  Follow-up with him as needed.

## 2019-05-16 DIAGNOSIS — R42 Dizziness and giddiness: Secondary | ICD-10-CM | POA: Insufficient documentation

## 2019-06-23 ENCOUNTER — Other Ambulatory Visit: Payer: Self-pay | Admitting: Neurological Surgery

## 2019-06-23 DIAGNOSIS — S12041A Nondisplaced lateral mass fracture of first cervical vertebra, initial encounter for closed fracture: Secondary | ICD-10-CM

## 2019-06-23 DIAGNOSIS — R1084 Generalized abdominal pain: Secondary | ICD-10-CM | POA: Diagnosis not present

## 2019-07-16 ENCOUNTER — Ambulatory Visit
Admission: RE | Admit: 2019-07-16 | Discharge: 2019-07-16 | Disposition: A | Discharge status: Home / Self Care | Payer: Medicare Other | Source: Ambulatory Visit | Attending: Neurological Surgery | Admitting: Neurological Surgery

## 2019-07-16 DIAGNOSIS — S12041A Nondisplaced lateral mass fracture of first cervical vertebra, initial encounter for closed fracture: Secondary | ICD-10-CM

## 2019-07-17 ENCOUNTER — Other Ambulatory Visit: Payer: Medicare Other

## 2019-08-13 ENCOUNTER — Other Ambulatory Visit (HOSPITAL_COMMUNITY): Payer: Self-pay | Admitting: Cardiovascular Disease

## 2019-08-13 DIAGNOSIS — I739 Peripheral vascular disease, unspecified: Secondary | ICD-10-CM

## 2019-08-19 ENCOUNTER — Other Ambulatory Visit: Payer: Self-pay

## 2019-08-19 ENCOUNTER — Other Ambulatory Visit (HOSPITAL_COMMUNITY): Payer: Self-pay | Admitting: Cardiovascular Disease

## 2019-08-19 ENCOUNTER — Ambulatory Visit (HOSPITAL_BASED_OUTPATIENT_CLINIC_OR_DEPARTMENT_OTHER)
Admission: RE | Admit: 2019-08-19 | Discharge: 2019-08-19 | Disposition: A | Discharge status: Home / Self Care | Payer: Medicare Other | Source: Ambulatory Visit | Attending: Cardiovascular Disease | Admitting: Cardiovascular Disease

## 2019-08-19 ENCOUNTER — Ambulatory Visit (HOSPITAL_COMMUNITY)
Admission: RE | Admit: 2019-08-19 | Discharge: 2019-08-19 | Disposition: A | Discharge status: Home / Self Care | Payer: Medicare Other | Source: Ambulatory Visit | Attending: Cardiovascular Disease | Admitting: Cardiovascular Disease

## 2019-08-19 DIAGNOSIS — I6523 Occlusion and stenosis of bilateral carotid arteries: Secondary | ICD-10-CM | POA: Diagnosis not present

## 2019-08-19 DIAGNOSIS — I739 Peripheral vascular disease, unspecified: Secondary | ICD-10-CM | POA: Diagnosis present

## 2019-08-19 DIAGNOSIS — I6522 Occlusion and stenosis of left carotid artery: Secondary | ICD-10-CM

## 2019-08-25 DIAGNOSIS — I739 Peripheral vascular disease, unspecified: Secondary | ICD-10-CM

## 2019-08-25 DIAGNOSIS — I6523 Occlusion and stenosis of bilateral carotid arteries: Secondary | ICD-10-CM

## 2019-08-25 DIAGNOSIS — I2510 Atherosclerotic heart disease of native coronary artery without angina pectoris: Secondary | ICD-10-CM

## 2019-08-26 ENCOUNTER — Encounter: Payer: Self-pay | Admitting: Cardiovascular Disease

## 2019-08-26 ENCOUNTER — Ambulatory Visit: Payer: Medicare Other | Admitting: Cardiovascular Disease

## 2019-08-26 ENCOUNTER — Other Ambulatory Visit: Payer: Self-pay

## 2019-08-26 VITALS — BP 150/74 | HR 77 | Temp 96.0°F | Ht 64.0 in | Wt 157.0 lb

## 2019-08-26 DIAGNOSIS — I6523 Occlusion and stenosis of bilateral carotid arteries: Secondary | ICD-10-CM

## 2019-08-26 DIAGNOSIS — E7800 Pure hypercholesterolemia, unspecified: Secondary | ICD-10-CM

## 2019-08-26 DIAGNOSIS — I739 Peripheral vascular disease, unspecified: Secondary | ICD-10-CM

## 2019-08-26 DIAGNOSIS — I10 Essential (primary) hypertension: Secondary | ICD-10-CM | POA: Diagnosis not present

## 2019-08-26 DIAGNOSIS — I2510 Atherosclerotic heart disease of native coronary artery without angina pectoris: Secondary | ICD-10-CM

## 2019-08-26 NOTE — Assessment & Plan Note (Addendum)
History of CAD status post proximal mid and distal RCA stenting using overlapping stents as well as AV groove circumflex stenting with known occluded OM branch and normal LV function.  His last Myoview 12/20/2009 was nonischemic.  He denies chest pain or shortness of breath.

## 2019-08-26 NOTE — Patient Instructions (Signed)
Medication Instructions:  Your physician recommends that you continue on your current medications as directed. Please refer to the Current Medication list given to you today.  If you need a refill on your cardiac medications before your next appointment, please call your pharmacy.   Lab work: NONE  Testing/Procedures: IN ONE YEAR THE FOLLOWING TEST: Your physician has requested that you have a carotid duplex. This test is an ultrasound of the carotid arteries in your neck. It looks at blood flow through these arteries that supply the brain with blood. Allow one hour for this exam. There are no restrictions or special instructions.  And  Your physician has requested that you have a lower extremity arterial exercise duplex. During this test, exercise and ultrasound are used to evaluate arterial blood flow in the legs. Allow one hour for this exam. There are no restrictions or special instructions.  And  Your physician has requested that you have an ankle brachial index (ABI). During this test an ultrasound and blood pressure cuff are used to evaluate the arteries that supply the arms and legs with blood. Allow thirty minutes for this exam. There are no restrictions or special instructions.  Follow-Up: At [facility], you and your health needs are our priority.  As part of our continuing mission to provide you with exceptional heart care, we have created designated Provider Care Teams.  These Care Teams include your primary Cardiologist (physician) and Advanced Practice Providers (APPs -  Physician Assistants and Nurse Practitioners) who all work together to provide you with the care you need, when you need it. You may see [name], MD or one of the following Advanced Practice Providers on your designated Care Team:    Kerin Ransom, PA-C  [place], [place]  Coletta Memos, [place]  Your physician wants you to follow-up in: 1 year with Dr. Gwenlyn Found after tests

## 2019-08-26 NOTE — Assessment & Plan Note (Signed)
History of carotid artery disease status post remote right carotid endarterectomy with recent carotid Dopplers performed 08/19/2019 revealing a widely patent endarterectomy site with moderate left ICA stenosis that has progressed since the last study.  We will continue to follow him on an annual basis by duplex ultrasound.

## 2019-08-26 NOTE — Progress Notes (Signed)
08/26/2019 [name] [facility]   May 19, 1931  [id number]  Primary Physician [name], MD Primary Cardiologist: [name] [name], [place], [place]  HPI:  Nathan BOOHER is a 86 y.o.  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 He was formally a patient of [name]. I last saw him in the office  08/19/2018.[name] His cardiovascular status profile is remarkable for remote tobacco abuse, 2 hypertension and hyperlipidemia. He is not diabetic. He does [place] 5 days a week 25 minutes a day. He hasn't known cardiovascular disease status post stenting of his proximal mid and distal RCA using overlapping stents as well as the proximal AV groove circumflex with a known occluded OM branch which is old. He has normal LV function by 2-D echo performed 02/07/12 a nonischemic Myoview 2010. He denies chest pain, shortness of breath or claudication. He had carotid Dopplers performed 01/09/14 that showed a widely patent right carotid endarterectomy site with moderate left ICA stenosis remained stable. Since I saw him in January of this year he's noticed increasing bilateral lower extremity claudication which is lifestyle limiting. Dopplers have shown a decline in his ABIs bilaterally. He does have high frequency signal in his right greater than left iliac systems. I performed peripheral angiography on him 04/24/16 revealing a 75% distal abdominal aortic stenosis with a 60 mm pullback gradient. He also had a 95% calcified distal left common femoral artery stenosis and an 80% segmental mid right SFA stenosis with 2 vessel runoff bilaterally. I stented his distal abdominal aorta with a 10 x 29 mm long balloon expandable stent. His ABIs and increased from 0.7 bilaterally up to 1 on the right and 0.8 on the left. His claudication completely resolved. He was able to go on a trip to the [facility] and ambulated without  limitation. Since I sawhim a year ago he is complained of bilateral thigh claudication which is lifestyle limiting. His Dopplers do appear to show moderate iliac disease.   I performed outpatient peripheral angiography on 08/05/2018 revealing a patent distal abdominal aortic stent without a gradient and otherwise unchanged anatomy with a 95% calcified left common femoral artery stenosis and bilateral moderate SFA disease.  Since I saw him a year ago he continues to do well.  He is very active and walks without limitation or claudication.  He denies chest pain or shortness of breath.  Recent lower extremity arterial Doppler studies suggest a patent iliac stent.  Carotid Dopplers showed progression of disease in his left internal carotid artery.  Current Meds  Medication Sig   acetaminophen (TYLENOL) 500 MG tablet Take 1 tablet (500 mg total) by mouth 2 (two) times daily as needed.   amLODipine (NORVASC) 5 MG tablet Take 5 mg by mouth daily.     Apoaequorin (PREVAGEN PO) Take 1 tablet by mouth daily.   aspirin EC 81 MG tablet Take 1 tablet (81 mg total) by mouth 2 (two) times a day.   atenolol (TENORMIN) 50 MG tablet Take 50 mg by mouth daily.     atorvastatin (LIPITOR) 40 MG tablet Take 40 mg by mouth daily at 6 PM.    benzonatate (TESSALON) 100 MG capsule Take 100 mg by mouth at bedtime as needed for cough.    Biotin 10 MG CAPS Take 10 mg by mouth daily.   bismuth subsalicylate (PEPTO BISMOL) 262 MG/15ML suspension Take 30  mLs by mouth every 6 (six) hours as needed for indigestion.   cholecalciferol (VITAMIN D3) 25 MCG (1000 UT) tablet Take 1,000 Units by mouth daily.   cilostazol (PLETAL) 100 MG tablet    clopidogrel (PLAVIX) 75 MG tablet Take 75 mg by mouth every evening.    diphenhydrAMINE (SOMINEX) 25 MG tablet diphenhydramine 25 mg tablet   25 mg by oral route.   finasteride (PROSCAR) 5 MG tablet    hydrochlorothiazide 25 MG tablet Take 25 mg by mouth daily.      HYDROcodone-acetaminophen (NORCO/VICODIN) 5-325 MG tablet Take 1-2 tablets by mouth every 8 (eight) hours as needed. Take one tab for moderate pain and two tabs for severe pain.   losartan (COZAAR) 50 MG tablet Take 50 mg by mouth every evening.    Magnesium 250 MG TABS Take 250 mg by mouth at bedtime.   meclizine (ANTIVERT) 12.5 MG tablet    Misc Natural Products (GLUCOSAMINE CHONDROITIN TRIPLE) TABS Take 2 tablets by mouth daily.    Multiple Vitamin (MULTIVITAMIN WITH MINERALS) TABS tablet Take 1 tablet by mouth daily.   Omega-3 Fatty Acids (FISH OIL) 1000 MG CAPS Take by mouth.   pantoprazole (PROTONIX) 40 MG tablet TAKE 1 TABLET BY MOUTH TWICE A DAY   potassium chloride SA (K-DUR,KLOR-CON) 20 MEQ tablet Take 20 mEq by mouth every evening.    traMADol (ULTRAM) 50 MG tablet    triamcinolone cream (KENALOG) 0.1 % Apply 1 application topically 2 (two) times daily. To back rash   vitamin C (ASCORBIC ACID) 500 MG tablet Take 500 mg by mouth daily.     Allergies  Allergen Reactions   Lisinopril Cough   Niaspan [Niacin Er] Rash    Social History   Socioeconomic History   Marital status: Married    Spouse name: [name]   Number of children: Not on file   Years of education: BA   Highest education level: Not on file  Occupational History   Occupation: Retired  Tobacco Use   Smoking status: Former Smoker    Packs/day: 2.00    Years: 20.00    Pack years: 40.00    Types: Cigarettes    Quit date: 07/31/1968    Years since quitting: 51.1   Smokeless tobacco: Never Used  Substance and Sexual Activity   Alcohol use: Yes    Alcohol/week: 4.0 standard drinks    Types: 4 Glasses of wine per week   Drug use: No   Sexual activity: Not Currently  Other Topics Concern   Not on file  Social History Narrative   Lives with wife   Caffeine use: Decaf coffee, tea   Left handed   Social Determinants of Health   Financial Resource Strain:    Difficulty of Paying  Living Expenses: Not on file  Food Insecurity:    Worried About [place] in the Last Year: Not on file   [employer] of Food in the Last Year: Not on file  Transportation Needs:    Lack of Transportation (Medical): Not on file   Lack of Transportation (Non-Medical): Not on file  Physical Activity:    Days of Exercise per Week: Not on file   Minutes of Exercise per Session: Not on file  Stress:    Feeling of Stress : Not on file  Social Connections:    Frequency of Communication with Friends and Family: Not on file   Frequency of Social Gatherings with Friends and Family: Not on file  Attends Religious Services: Not on file   Active Member of Clubs or Organizations: Not on file   Attends Archivist Meetings: Not on file   Marital Status: Not on file  Intimate Partner Violence:    Fear of Current or Ex-Partner: Not on file   Emotionally Abused: Not on file   Physically Abused: Not on file   Sexually Abused: Not on file     Review of Systems: General: negative for chills, fever, night sweats or weight changes.  Cardiovascular: negative for chest pain, dyspnea on exertion, edema, orthopnea, palpitations, paroxysmal nocturnal dyspnea or shortness of breath Dermatological: negative for rash Respiratory: negative for cough or wheezing Urologic: negative for hematuria Abdominal: negative for nausea, vomiting, diarrhea, bright red blood per rectum, melena, or hematemesis Neurologic: negative for visual changes, syncope, or dizziness All other systems reviewed and are otherwise negative except as noted above.    Blood pressure (!) 150/74, pulse 77, temperature (!) 96 F (35.6 C), height 5\' 4"  (1.626 m), weight 157 lb (71.2 kg), SpO2 96 %.  General appearance: alert and no distress Neck: no adenopathy, no JVD, supple, symmetrical, trachea midline, thyroid not enlarged, symmetric, no tenderness/mass/nodules and Soft carotid Dopplers left louder than  right Lungs: clear to auscultation bilaterally Heart: regular rate and rhythm, S1, S2 normal, no murmur, click, rub or gallop Extremities: extremities normal, atraumatic, no cyanosis or edema Pulses: 2+ and symmetric Skin: Skin color, texture, turgor normal. No rashes or lesions Neurologic: Alert and oriented X 3, normal strength and tone. Normal symmetric reflexes. Normal coordination and gait  EKG not performed today  ASSESSMENT AND PLAN:   HYPERCHOLESTEROLEMIA History of hyperlipidemia on statin therapy with lipid profile performed 09/17/2018 revealing total cholesterol 136, LDL 71 and HDL 49.  Essential hypertension History of essential hypertension a blood pressure measured today at 150/74.  He is on amlodipine, atenolol, hydrochlorothiazide and losartan.  Coronary atherosclerosis History of CAD status post proximal mid and distal RCA stenting using overlapping stents as well as AV groove circumflex stenting with known occluded OM branch and normal LV function.  His last Myoview 12/20/2009 was nonischemic.  He denies chest pain or shortness of breath.  Peripheral arterial disease ([place]) History of peripheral arterial disease status post distal abdominal aortic stenting by myself 04/24/2016 with a 10 mm x 29 mm long balloon expandable stent.  His ABIs increased from 0.7 up to 1 on the right and up to 0.8 on the left.  His claudication resolved.  He is fairly active.  I reangiogrammed him 08/05/2018 revealing a widely patent stent in his distal abdominal aorta with a 95% calcified plaque in his left common femoral artery and SFA disease.  Recent Doppler suggested his aortic stent is patent.  He walks without claudication or limitation.  Bilateral carotid artery disease (HCC) History of carotid artery disease status post remote right carotid endarterectomy with recent carotid Dopplers performed 08/19/2019 revealing a widely patent endarterectomy site with moderate left ICA stenosis that has  progressed since the last study.  We will continue to follow him on an annual basis by duplex ultrasound.      [name] FACP,FACC,FAHA, [facility] 08/26/2019 10:55 AM

## 2019-08-26 NOTE — Assessment & Plan Note (Signed)
History of peripheral arterial disease status post distal abdominal aortic stenting by myself 04/24/2016 with a 10 mm x 29 mm long balloon expandable stent.  His ABIs increased from 0.7 up to 1 on the right and up to 0.8 on the left.  His claudication resolved.  He is fairly active.  I reangiogrammed him 08/05/2018 revealing a widely patent stent in his distal abdominal aorta with a 95% calcified plaque in his left common femoral artery and SFA disease.  Recent Doppler suggested his aortic stent is patent.  He walks without claudication or limitation.

## 2019-08-26 NOTE — Assessment & Plan Note (Signed)
History of hyperlipidemia on statin therapy with lipid profile performed 09/17/2018 revealing total cholesterol 136, LDL 71 and HDL 49.

## 2019-08-26 NOTE — Assessment & Plan Note (Signed)
History of essential hypertension a blood pressure measured today at 150/74.  He is on amlodipine, atenolol, hydrochlorothiazide and losartan.

## 2019-10-24 IMAGING — CT CT CERVICAL SPINE W/O CM
3 of 13 series · 6 of 33 positions shown, 7 images · non-contrast
Comparison: None.

CLINICAL DATA: Pt reports he was trying to get into his car when he
slipped. Pt reports falling face first onto concrete. Pt reports no
loss of consciousness. Pt presents with hematoma to left forehead
and abrasions to noseAtaxia, post head trauma; C-spine trauma, high
clinical risk (NEXUS/CCR); Facial trauma, fx suspected

EXAM:
CT HEAD WITHOUT CONTRAST
CT MAXILLOFACIAL WITHOUT CONTRAST
CT CERVICAL SPINE WITHOUT CONTRAST
TECHNIQUE: Multidetector CT imaging of the head, cervical spine, and
maxillofacial structures were performed using the standard protocol
without intravenous contrast. Multiplanar CT image reconstructions
of the cervical spine and maxillofacial structures were also
generated.

[Series 12: maxilllofacial 2.0 hr40 3 · axial · 0.36mm/px · z∈[+913,+1103]mm · 3 of 96 slices shown, 4 images]
[im 1/96  soft-tissue]
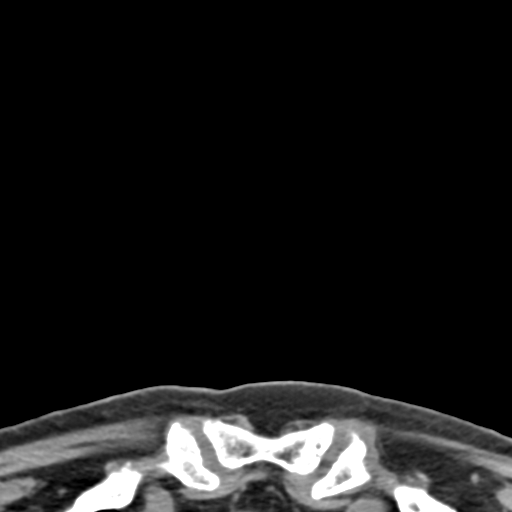
[im 1/96  bone]
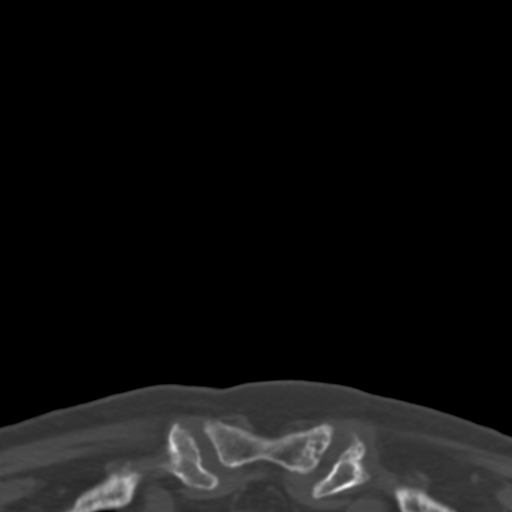
[im 48/96  bone]
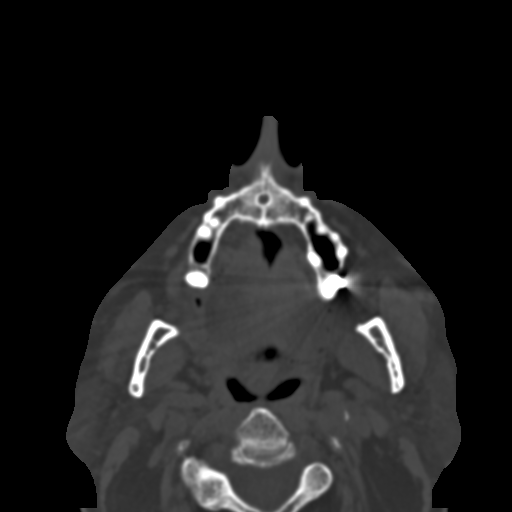
[im 96/96  bone]
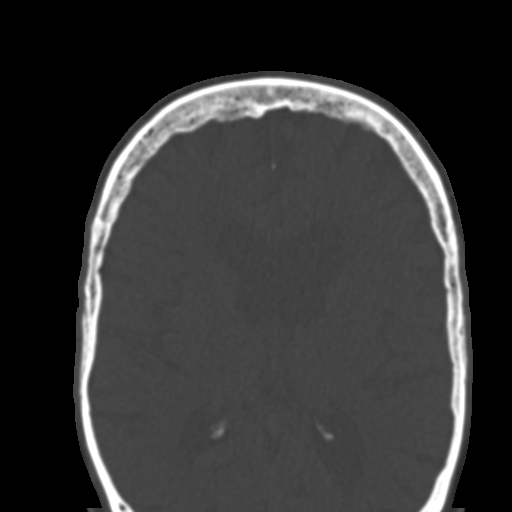

[Series 17: st sag · sagittal · 0.37mm/px · 2 of 91 slices shown]
[im 31/91  bone]
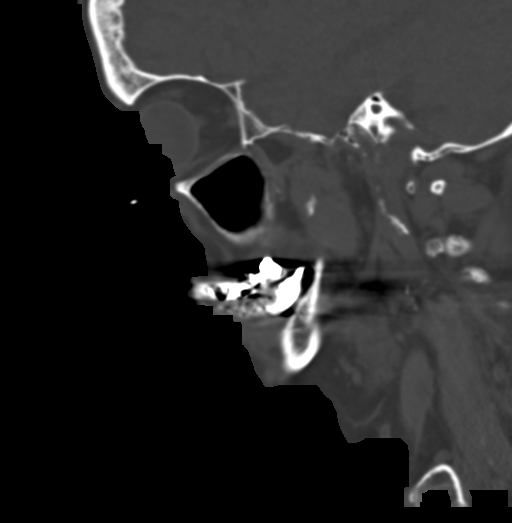
[im 61/91  bone]
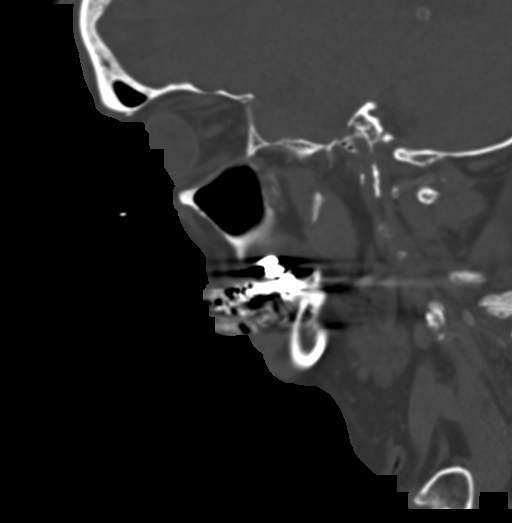

[Series 18: bone cor · coronal · 0.38mm/px · 1 of 88 slices shown]
[im 44/88  bone]
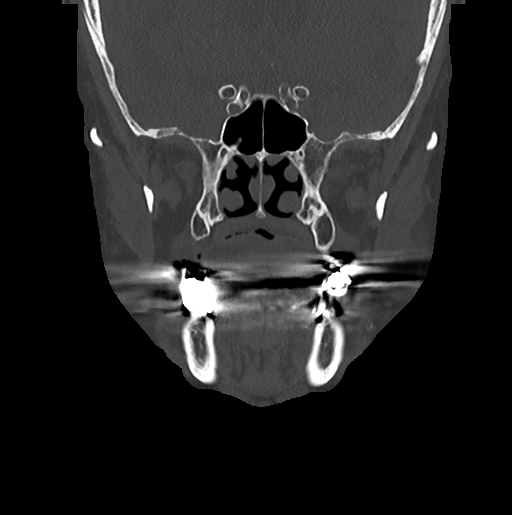

[6 of 33 positions shown; findings below may reference images not displayed]

FINDINGS: CT HEAD FINDINGS

Brain: No intracranial hemorrhage. No parenchymal contusion. No
midline shift or mass effect. Basilar cisterns are patent. No skull
base fracture. No fluid in the paranasal sinuses or mastoid air
cells. Orbits are normal.

There are periventricular and subcortical white matter
hypodensities. Generalized cortical atrophy.

Vascular: No hyperdense vessel or unexpected calcification.

Skull: No skull fracture.

Other: Very shallow scalp hematoma over the anterior LEFT frontal
lobe.

CT MAXILLOFACIAL FINDINGS

Osseous: Orbital rims are intact. Zygomatic arches are normal.
Maxillary sinus walls intact. Pterygoid plates are normal. No nasal
bone fracture.

Endo condyles are located.  No mandibular fracture

Orbits: No propped ptosis. Intraconal contents normal. Globes are
normal.

Sinuses: No fluid in the paranasal sinuses.

Soft tissues: Unremarkable.

CT CERVICAL SPINE FINDINGS

Alignment: Normal alignment of the cervical vertebral bodies. Mild
anterolisthesis of C4 on C5 by 2 mm is favored degenerative.

Skull base and vertebrae: Normal craniocervical junction. No loss of
vertebral body height or disc height. Normal facet articulation.

Nondisplaced fracture of the neural arch of C1. Fracture on the LEFT
at the junction with the pedicle (image [DATE], image [DATE]). No clear
evidence of adjacent soft tissue injury.

Soft tissues and spinal canal: No prevertebral soft tissue swelling.
No perispinal or epidural hematoma.

Disc levels: Disc space narrowing at C5-C6 with associated endplate
osteophytosis. Multiple levels of facet hypertrophy.

Upper chest: Clear

Other: None
IMPRESSION: 1. No intracranial trauma. Small frontal scalp hematoma. No fracture
2. Atrophy and white matter microvascular disease within the brain.
3. No facial bone fracture.
4. NONDISPLACED FRACTURE OF THE NEURAL ARCH AT C1. Fracture on the
LEFT adjacent to the pedicle. No clear soft tissue injury identified
by CT. Fracture is age indeterminate including acute or subacute
versus chronic fracture. Consider MRI of the cervical spine to look
for marrow edema and associated soft tissue injury which could help
determine chronicity.
5. No additional evidence of cervical spine fracture.
6. Mild disc osteophytic disease in the lower cervical spine.

Findings conveyed to [REDACTED], MDon 04/30/2019  at[DATE].

## 2019-10-24 IMAGING — CR DG WRIST COMPLETE 3+V*R*
4 series · 4 of 4 positions shown · non-contrast
Comparison: None.

CLINICAL DATA: Right wrist pain after fall

EXAM:
RIGHT WRIST - COMPLETE 3+ VIEW

[wrist pa]
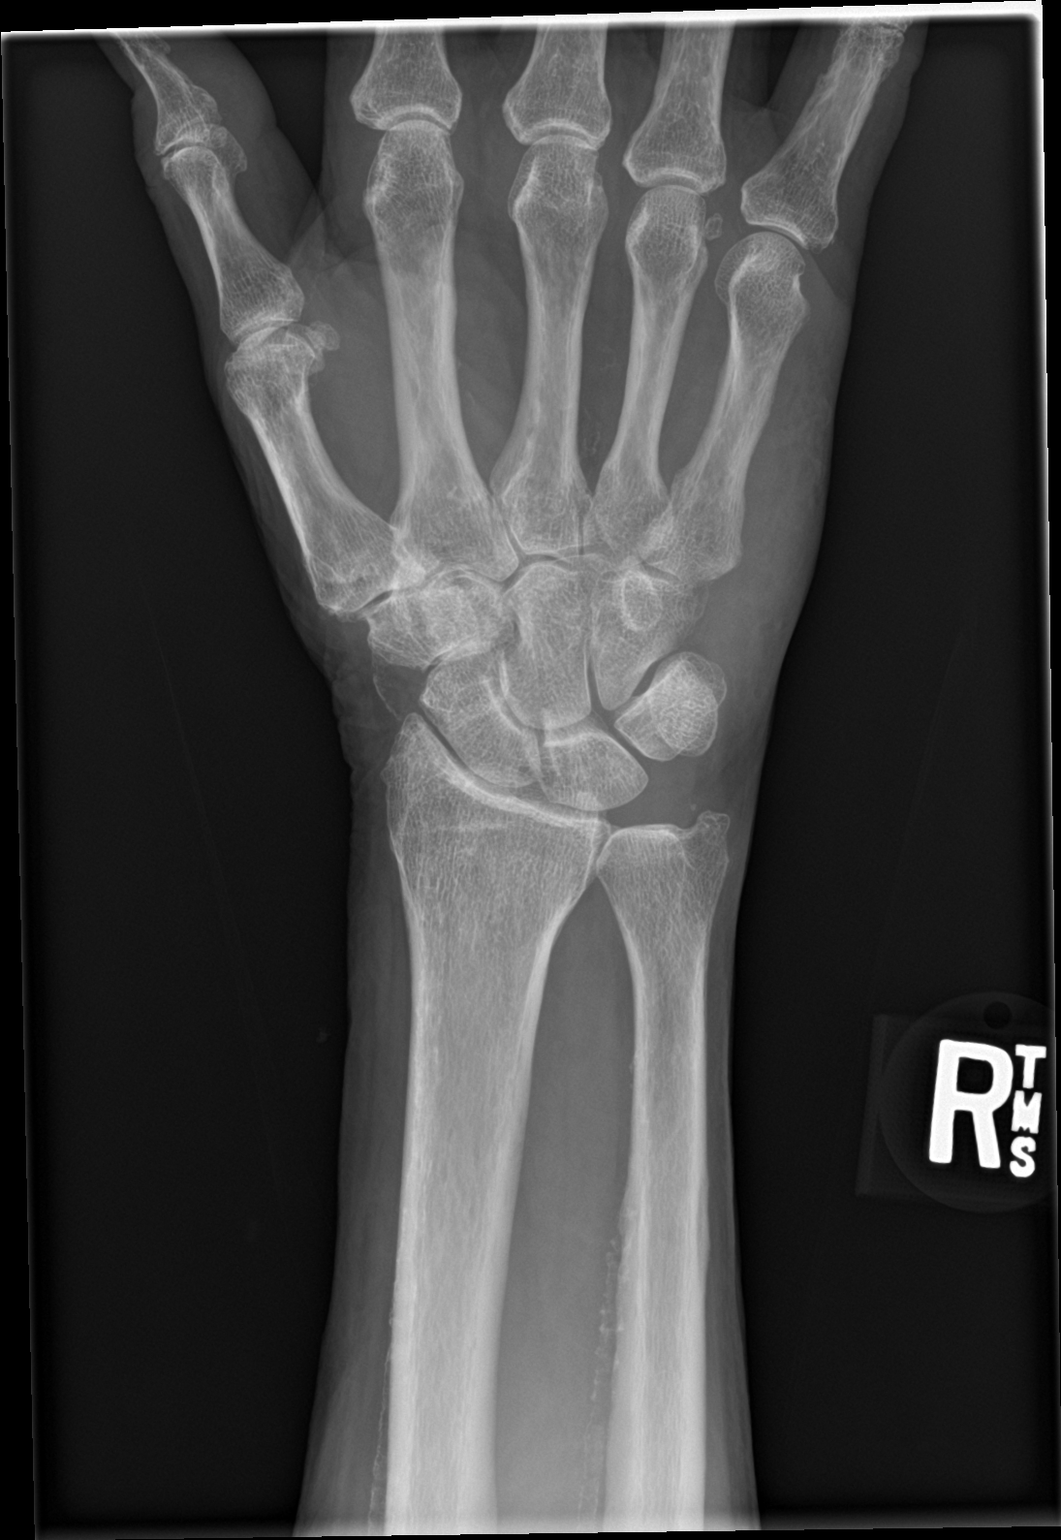

[wrist obl]
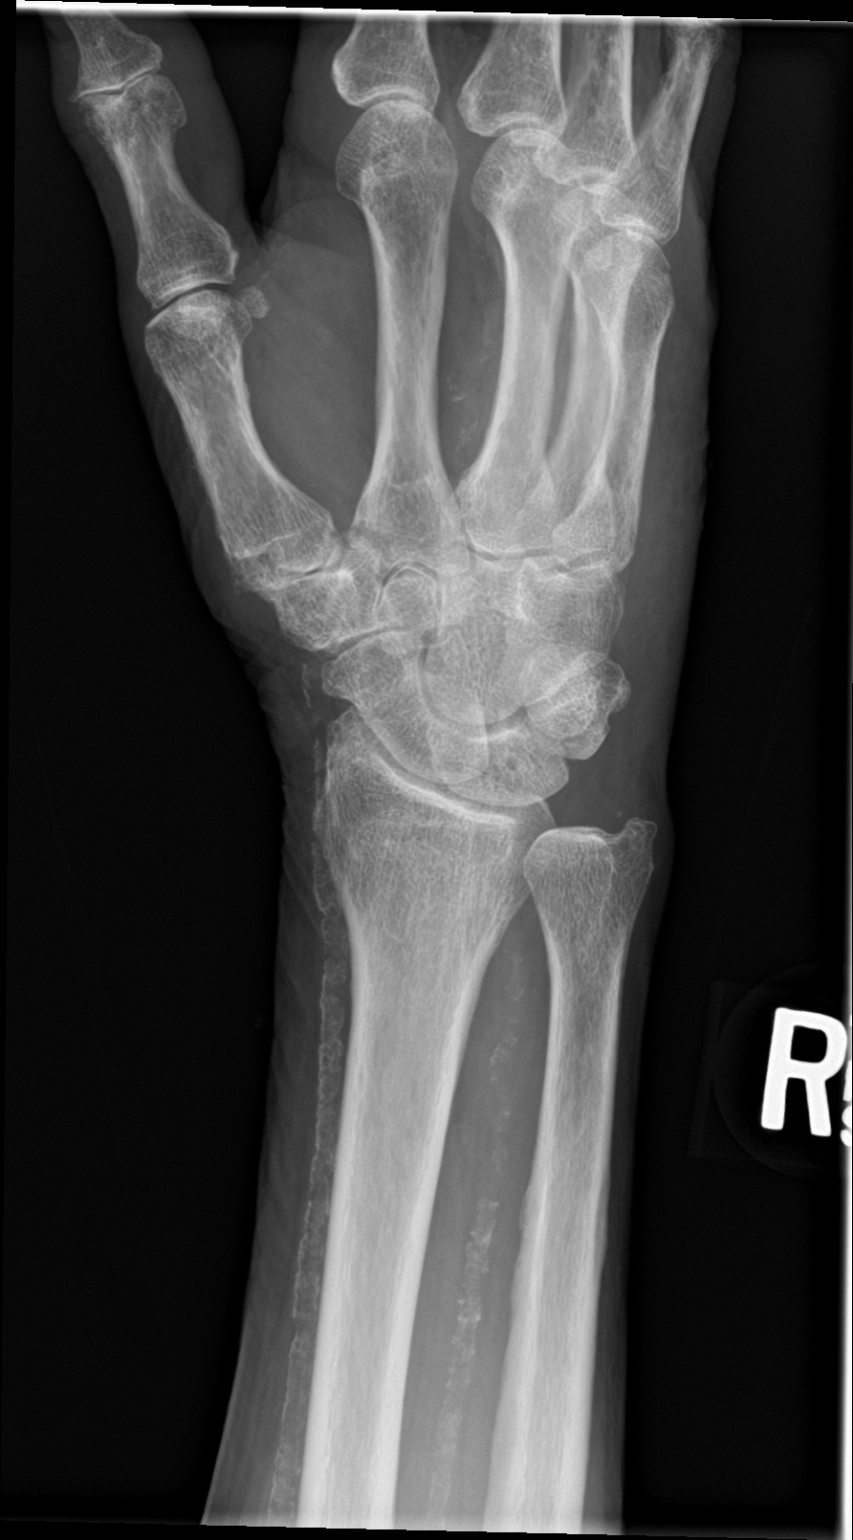

[wrist lat]
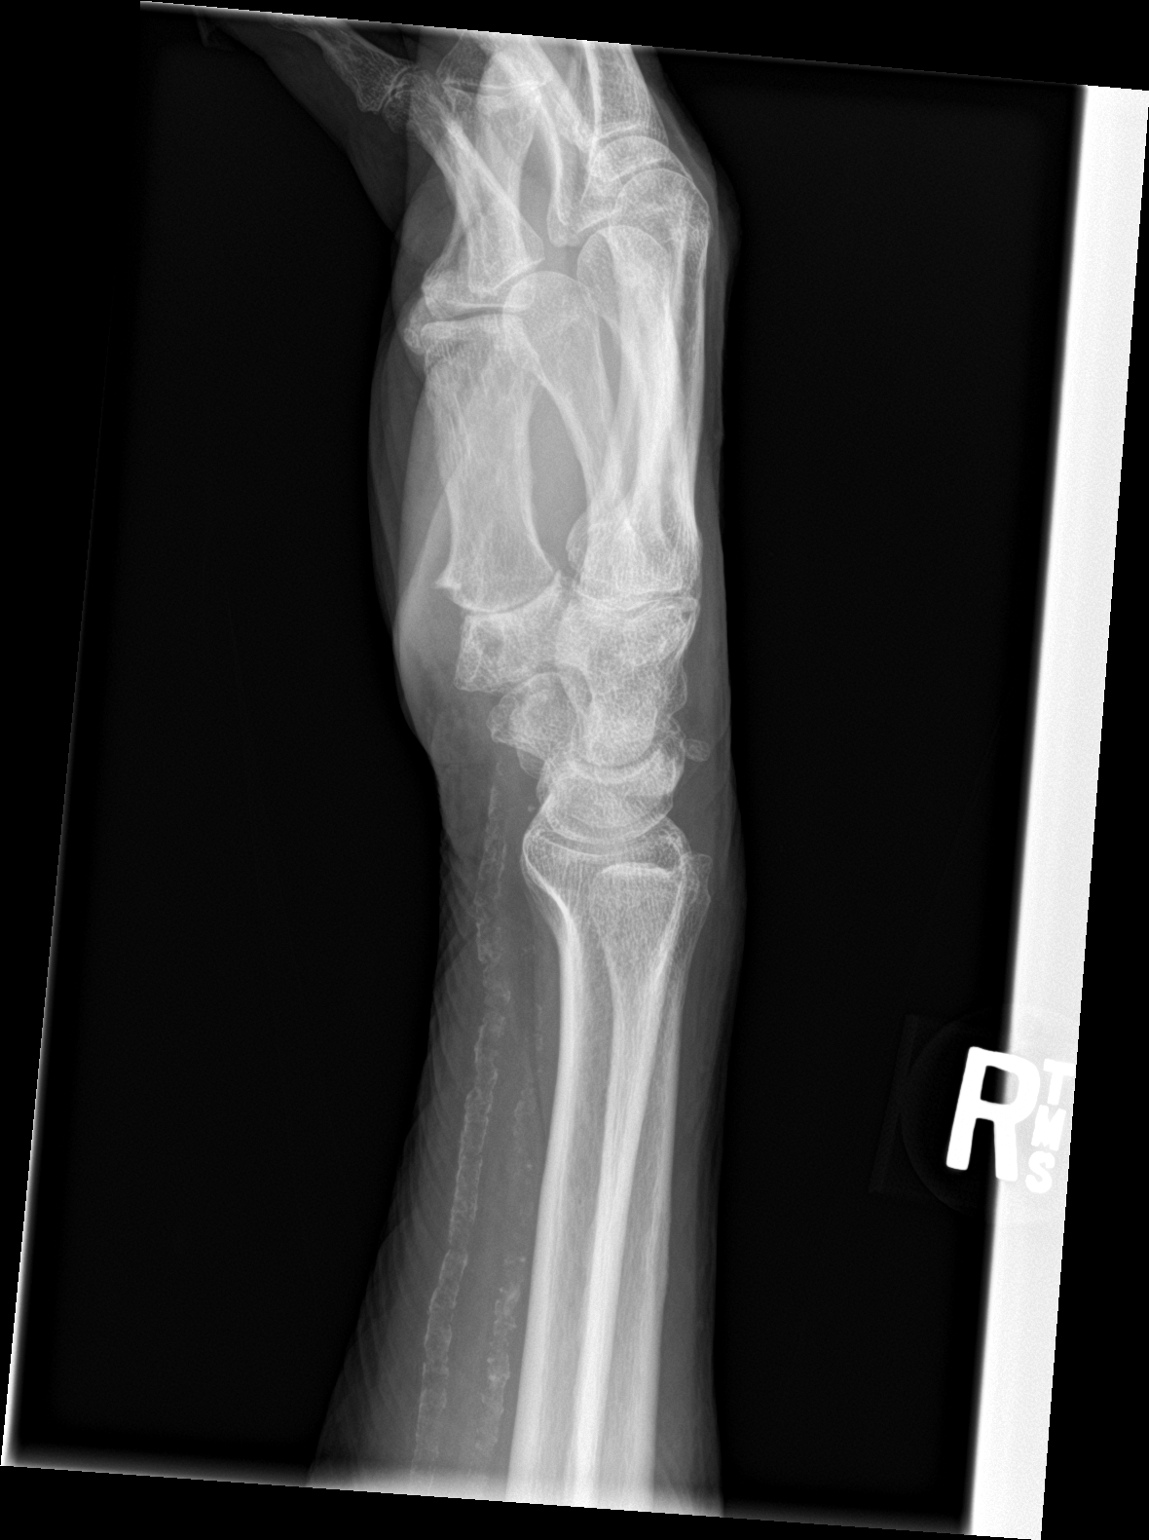

[wrist navicular]
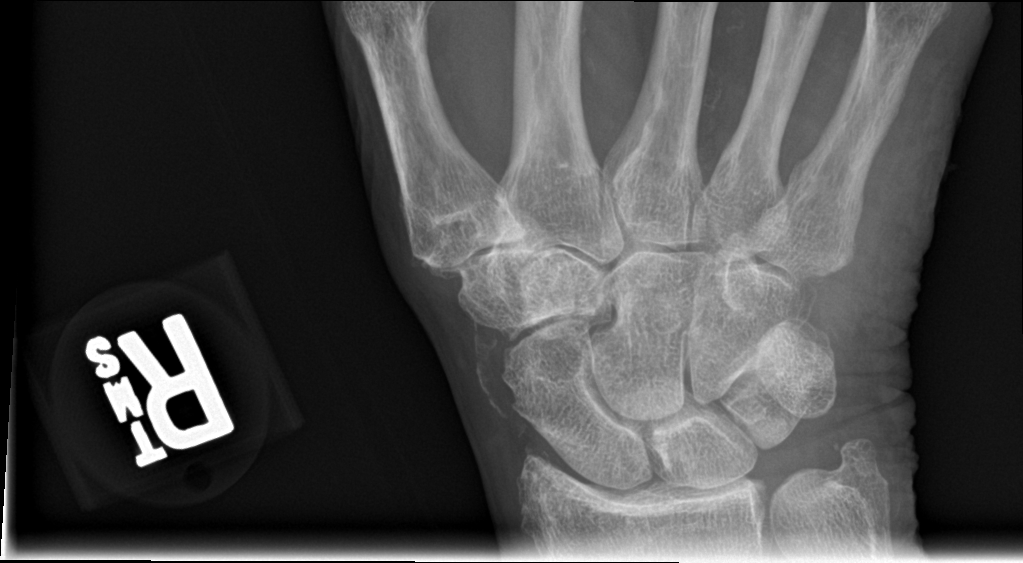

[4 of 4 positions shown; findings below may reference images not displayed]

FINDINGS: No acute fracture or dislocation. Moderate degenerative changes at
the radiocarpal joint, first CMC joint, and triscaphe joint. Well
corticated ossification dorsal to the proximal carpal row, likely
sequela of remote trauma. No focal soft tissue swelling. Advanced
vascular calcifications.
IMPRESSION: No acute osseous abnormality, right wrist.

## 2019-12-02 ENCOUNTER — Ambulatory Visit: Payer: Medicare Other | Admitting: Podiatry

## 2019-12-02 ENCOUNTER — Other Ambulatory Visit: Payer: Self-pay

## 2019-12-02 ENCOUNTER — Encounter: Payer: Self-pay | Admitting: Podiatry

## 2019-12-02 DIAGNOSIS — B351 Tinea unguium: Secondary | ICD-10-CM

## 2019-12-02 DIAGNOSIS — M722 Plantar fascial fibromatosis: Secondary | ICD-10-CM | POA: Diagnosis not present

## 2019-12-02 DIAGNOSIS — M79676 Pain in unspecified toe(s): Secondary | ICD-10-CM

## 2019-12-02 NOTE — Progress Notes (Signed)
He presents today chief complaint of painful heels bilaterally.  He is also complaining of painfully elongated toenails.  And he would like a insole for his shoes.  Objective: Vital signs are stable he is alert and oriented x3 he has pain on palpation medial calcaneal tubercles bilateral pulses remain palpable.  Toenails are long thick yellow dystrophic.  Assessment: Pain in limb secondary to plantar fasciitis pain limb secondary onychomycosis.  Plan: Injected bilateral heels today with 20 mg Kenalog 5 mg Marcaine.  Also debrided his toenails.  Provided him with insoles.  Follow-up with him as needed.

## 2019-12-22 ENCOUNTER — Telehealth: Payer: Self-pay | Admitting: Cardiovascular Disease

## 2019-12-22 NOTE — Telephone Encounter (Signed)
I spoke to the patient who called because this past Saturday he was at a party and on the deck, when he noticed that he "couldn't function" and his balance was "off".  He denies CP or SOB.    He told his wife that he was ready to leave and on the way home, about 20 minutes later, he felt better and was able to function.  His BP and HR have been stable.  He wanted to know if Dr Gwenlyn Found had any advisement.  He will continue to monitor.

## 2019-12-22 NOTE — Telephone Encounter (Signed)
He needs to make an appointment with his PCP, Dr Forde Dandy, this week

## 2019-12-22 NOTE — Telephone Encounter (Signed)
Patient had a mini stroke and wants to know if Dr. Gwenlyn Found advises him to be seen in the office soon. Please advise.

## 2019-12-23 NOTE — Telephone Encounter (Signed)
I spoke to the patient and informed him of Dr Kennon Holter advisement to call PCP to further evaluate.  He verbalized understanding.

## 2020-05-12 ENCOUNTER — Ambulatory Visit: Payer: Medicare Other | Admitting: Podiatry

## 2020-05-12 ENCOUNTER — Other Ambulatory Visit: Payer: Self-pay

## 2020-05-12 ENCOUNTER — Encounter: Payer: Self-pay | Admitting: Podiatry

## 2020-05-12 DIAGNOSIS — M722 Plantar fascial fibromatosis: Secondary | ICD-10-CM

## 2020-05-12 NOTE — Progress Notes (Signed)
Subjective:   Patient ID: Nathan Santiago, male   DOB: 86 y.o.   MRN: [id number]   HPI Patient presents stating that he also been sore making it hard to walk on and it is been present for the last several weeks   ROS      Objective:  Physical Exam  Neurovascular status intact with acute reoccurrence inflammation of the plantar fascia right insertion tendon calcaneus     Assessment:  Acute plantar fasciitis bilateral     Plan:  H&P reviewed condition sterile prep done and injected the plantar fascial bilateral 3 mg Kenalog 5 mg Xylocaine and instructed on supportive shoes

## 2020-06-01 ENCOUNTER — Ambulatory Visit: Payer: Medicare Other | Admitting: Podiatry

## 2020-07-06 ENCOUNTER — Telehealth: Payer: Self-pay | Admitting: Cardiovascular Disease

## 2020-07-06 NOTE — Telephone Encounter (Signed)
Spoke with patient. Patient's reports a nurse practitioner told him he had a heart murmur and he wanted to know what that means. Discussed murmur and possible causes of murmurs. Patient educated not to be alarmed and that it can be assessed at yearly check up in January. No new cardiac symptoms at this time. Patient verbalized understanding, no further questions.

## 2020-07-06 NOTE — Telephone Encounter (Signed)
Patient says that doctor's office said that he has a heart murmur and he wants to know if there is something to be worried about. Please call

## 2020-07-20 ENCOUNTER — Ambulatory Visit: Payer: Medicare Other | Admitting: Podiatry

## 2020-07-20 ENCOUNTER — Encounter: Payer: Self-pay | Admitting: Podiatry

## 2020-07-20 ENCOUNTER — Other Ambulatory Visit: Payer: Self-pay

## 2020-07-20 DIAGNOSIS — M7730 Calcaneal spur, unspecified foot: Secondary | ICD-10-CM

## 2020-07-20 DIAGNOSIS — M722 Plantar fascial fibromatosis: Secondary | ICD-10-CM

## 2020-07-20 NOTE — Progress Notes (Signed)
Subjective:  Patient ID: Nathan Santiago, male    DOB: [birth year],  MRN: [id number]  Chief Complaint  Patient presents with  . Injections    Bilateral injections.    86 y.o. male presents with the above complaint.  Patient presents with follow-up of plantar fasciitis bilaterally.  Patient has been treated with Dr. Paulla Dolly and had received an injection a month or so ago.  Patient states the injection helped considerably.  He would like to do another injection as he is only 70 to 80% improved.  He states allows him to walk properly denies any other acute complaints.   Review of Systems: Negative except as noted in the HPI. Denies N/V/F/Ch.  Past Medical History:  Diagnosis Date  . AAA (abdominal aortic aneurysm) (HCC)    asymptomatic  . AAA (abdominal aortic aneurysm) ([place]) 2010   peripheral  angiogram-- bilateral SFA DISEASE  and 60 to 70% infrarenaal abd. aortic stenosis with 15 -mm gradient  . Adenomatous colon polyp 11/2003  . CAD (coronary artery disease)   . Gait abnormality 02/13/2017  . Gastroparesis    pt denies  . GERD (gastroesophageal reflux disease)    w/ LPR  . History of kidney stones    x2  . Hyperlipidemia   . Hypertension   . Peripheral arterial disease ([place])    RCEA  by Dr Lemar Livings  . PUD (peptic ulcer disease)    pt unaware  . Vertigo     Current Outpatient Medications:  .  acetaminophen (TYLENOL) 500 MG tablet, Take 1 tablet (500 mg total) by mouth 2 (two) times daily as needed., Disp: 30 tablet, Rfl: 2 .  amLODipine (NORVASC) 5 MG tablet, Take 5 mg by mouth daily.  , Disp: , Rfl:  .  Apoaequorin (PREVAGEN PO), Take 1 tablet by mouth daily., Disp: , Rfl:  .  aspirin EC 81 MG tablet, Take 1 tablet (81 mg total) by mouth 2 (two) times a day., Disp: 60 tablet, Rfl: 0 .  atenolol (TENORMIN) 50 MG tablet, Take 50 mg by mouth daily.  , Disp: , Rfl:  .  atorvastatin (LIPITOR) 40 MG tablet, Take 40 mg by mouth daily at 6 PM. , Disp: , Rfl:  .  benzonatate  (TESSALON) 100 MG capsule, Take 100 mg by mouth at bedtime as needed for cough. , Disp: , Rfl: 0 .  Biotin 10 MG CAPS, Take 10 mg by mouth daily., Disp: , Rfl:  .  bismuth subsalicylate (PEPTO BISMOL) 262 MG/15ML suspension, Take 30 mLs by mouth every 6 (six) hours as needed for indigestion., Disp: , Rfl:  .  cholecalciferol (VITAMIN D3) 25 MCG (1000 UT) tablet, Take 1,000 Units by mouth daily., Disp: , Rfl:  .  cilostazol (PLETAL) 100 MG tablet, , Disp: , Rfl:  .  clopidogrel (PLAVIX) 75 MG tablet, Take 75 mg by mouth every evening. , Disp: , Rfl:  .  diphenhydrAMINE (SOMINEX) 25 MG tablet, diphenhydramine 25 mg tablet   25 mg by oral route., Disp: , Rfl:  .  finasteride (PROSCAR) 5 MG tablet, , Disp: , Rfl:  .  hydrochlorothiazide 25 MG tablet, Take 25 mg by mouth daily.  , Disp: , Rfl:  .  losartan (COZAAR) 50 MG tablet, Take 50 mg by mouth every evening. , Disp: , Rfl:  .  Magnesium 250 MG TABS, Take 250 mg by mouth at bedtime., Disp: , Rfl:  .  meclizine (ANTIVERT) 12.5 MG tablet, , Disp: , Rfl:  .  Misc Natural Products (GLUCOSAMINE CHONDROITIN TRIPLE) TABS, Take 2 tablets by mouth daily. , Disp: , Rfl:  .  Multiple Vitamin (MULTIVITAMIN WITH MINERALS) TABS tablet, Take 1 tablet by mouth daily., Disp: , Rfl:  .  Omega-3 Fatty Acids (FISH OIL) 1000 MG CAPS, Take by mouth., Disp: , Rfl:  .  pantoprazole (PROTONIX) 40 MG tablet, TAKE 1 TABLET BY MOUTH TWICE A DAY, Disp: 180 tablet, Rfl: 0 .  potassium chloride SA (K-DUR,KLOR-CON) 20 MEQ tablet, Take 20 mEq by mouth every evening. , Disp: , Rfl:  .  traMADol (ULTRAM) 50 MG tablet, , Disp: , Rfl:  .  triamcinolone cream (KENALOG) 0.1 %, Apply 1 application topically 2 (two) times daily. To back rash, Disp: , Rfl:  .  vitamin C (ASCORBIC ACID) 500 MG tablet, Take 500 mg by mouth daily., Disp: , Rfl:   Social History   Tobacco Use  Smoking Status Former Smoker  . Packs/day: 2.00  . Years: 20.00  . Pack years: 40.00  . Types: Cigarettes  .  Quit date: 07/31/1968  . Years since quitting: 52.0  Smokeless Tobacco Never Used    Allergies  Allergen Reactions  . Lisinopril Cough  . Niaspan [Niacin Er] Rash   Objective:  There were no vitals filed for this visit. There is no height or weight on file to calculate BMI. Constitutional Well developed. Well nourished.  Vascular Dorsalis pedis pulses palpable bilaterally. Posterior tibial pulses palpable bilaterally. Capillary refill normal to all digits.  No cyanosis or clubbing noted. Pedal hair growth normal.  Neurologic Normal speech. Oriented to person, place, and time. Epicritic sensation to light touch grossly present bilaterally.  Dermatologic Nails well groomed and normal in appearance. No open wounds. No skin lesions.  Orthopedic: Normal joint ROM without pain or crepitus bilaterally. No visible deformities. Tender to palpation at the calcaneal tuber bilaterally. No pain with calcaneal squeeze bilaterally. Ankle ROM diminished range of motion bilaterally. Silfverskiold Test: positive bilaterally.   Radiographs: None  Assessment:   1. Plantar fasciitis of right foot   2. Plantar fasciitis of left foot   3. Heel spur, unspecified laterality    Plan:  Patient was evaluated and treated and all questions answered.  Plantar Fasciitis, bilaterally - XR reviewed as above.  - Educated on icing and stretching. Instructions given.  -Second injection delivered to the plantar fascia as below. - DME: None - Pharmacologic management: None  Procedure: Injection Tendon/Ligament Location: Bilateral plantar fascia at the glabrous junction; medial approach. Skin Prep: alcohol Injectate: 0.5 cc 0.5% marcaine plain, 0.5 cc of 1% Lidocaine, 0.5 cc kenalog 10. Disposition: Patient tolerated procedure well. Injection site dressed with a band-aid.  No follow-ups on file.

## 2020-08-02 ENCOUNTER — Other Ambulatory Visit (HOSPITAL_COMMUNITY): Payer: Self-pay | Admitting: Emergency Medicine

## 2020-08-02 ENCOUNTER — Other Ambulatory Visit (HOSPITAL_COMMUNITY): Payer: Self-pay | Admitting: Cardiovascular Disease

## 2020-08-02 DIAGNOSIS — I739 Peripheral vascular disease, unspecified: Secondary | ICD-10-CM

## 2020-08-19 ENCOUNTER — Ambulatory Visit (HOSPITAL_BASED_OUTPATIENT_CLINIC_OR_DEPARTMENT_OTHER)
Admission: RE | Admit: 2020-08-19 | Discharge: 2020-08-19 | Disposition: A | Discharge status: Home / Self Care | Payer: Medicare Other | Source: Ambulatory Visit | Attending: Cardiology | Admitting: Cardiology

## 2020-08-19 ENCOUNTER — Other Ambulatory Visit: Payer: Self-pay

## 2020-08-19 ENCOUNTER — Ambulatory Visit (HOSPITAL_COMMUNITY)
Admission: RE | Admit: 2020-08-19 | Discharge: 2020-08-19 | Disposition: A | Discharge status: Home / Self Care | Payer: Medicare Other | Source: Ambulatory Visit | Attending: Cardiology | Admitting: Cardiology

## 2020-08-19 ENCOUNTER — Other Ambulatory Visit (HOSPITAL_COMMUNITY): Payer: Self-pay | Admitting: Cardiovascular Disease

## 2020-08-19 ENCOUNTER — Ambulatory Visit (HOSPITAL_BASED_OUTPATIENT_CLINIC_OR_DEPARTMENT_OTHER)
Admission: RE | Admit: 2020-08-19 | Discharge: 2020-08-19 | Disposition: A | Discharge status: Home / Self Care | Payer: Medicare Other | Source: Ambulatory Visit | Attending: Cardiovascular Disease | Admitting: Cardiovascular Disease

## 2020-08-19 DIAGNOSIS — E7800 Pure hypercholesterolemia, unspecified: Secondary | ICD-10-CM

## 2020-08-19 DIAGNOSIS — Z95828 Presence of other vascular implants and grafts: Secondary | ICD-10-CM

## 2020-08-19 DIAGNOSIS — I739 Peripheral vascular disease, unspecified: Secondary | ICD-10-CM

## 2020-08-19 DIAGNOSIS — I6523 Occlusion and stenosis of bilateral carotid arteries: Secondary | ICD-10-CM

## 2020-08-27 ENCOUNTER — Encounter: Payer: Self-pay | Admitting: Cardiovascular Disease

## 2020-08-27 ENCOUNTER — Ambulatory Visit: Payer: Medicare Other | Admitting: Cardiovascular Disease

## 2020-08-27 ENCOUNTER — Other Ambulatory Visit: Payer: Self-pay

## 2020-08-27 DIAGNOSIS — I2510 Atherosclerotic heart disease of native coronary artery without angina pectoris: Secondary | ICD-10-CM

## 2020-08-27 DIAGNOSIS — I739 Peripheral vascular disease, unspecified: Secondary | ICD-10-CM

## 2020-08-27 DIAGNOSIS — I10 Essential (primary) hypertension: Secondary | ICD-10-CM

## 2020-08-27 DIAGNOSIS — E7800 Pure hypercholesterolemia, unspecified: Secondary | ICD-10-CM

## 2020-08-27 DIAGNOSIS — I6523 Occlusion and stenosis of bilateral carotid arteries: Secondary | ICD-10-CM

## 2020-08-27 NOTE — Patient Instructions (Signed)
Medication Instructions:  Your physician recommends that you continue on your current medications as directed. Please refer to the Current Medication list given to you today.  *If you need a refill on your cardiac medications before your next appointment, please call your pharmacy*   Testing/Procedures: Your physician has requested that you have a lower or upper extremity arterial duplex. This test is an ultrasound of the arteries in the legs or arms. It looks at arterial blood flow in the legs and arms. Allow one hour for Lower and Upper Arterial scans. There are no restrictions or special instructions To be done at [place], 2nd Floor.  To be done in Jan 2023  Your physician has requested that you have a carotid duplex. This test is an ultrasound of the carotid arteries in your neck. It looks at blood flow through these arteries that supply the brain with blood. Allow one hour for this exam. There are no restrictions or special instructions. To be done at [place], 2nd Floor.  To be done in Jan 2023    Follow-Up: At [facility], you and your health needs are our priority.  As part of our continuing mission to provide you with exceptional heart care, we have created designated Provider Care Teams.  These Care Teams include your primary Cardiologist (physician) and Advanced Practice Providers (APPs -  Physician Assistants and Nurse Practitioners) who all work together to provide you with the care you need, when you need it.  We recommend signing up for the patient portal called "MyChart".  Sign up information is provided on this After Visit Summary.  MyChart is used to connect with patients for Virtual Visits (Telemedicine).  Patients are able to view lab/test results, encounter notes, upcoming appointments, etc.  Non-urgent messages can be sent to your provider as well.   To learn more about what you can do with MyChart, go to [email or web address].    Your next  appointment:   12 month(s)  The format for your next appointment:   In Person  Provider:   [name], MD

## 2020-08-27 NOTE — Assessment & Plan Note (Signed)
History of hyperlipidemia on statin therapy with lipid profile performed 10/15/2019 revealing total cholesterol 140, LDL 69 and HDL 45.

## 2020-08-27 NOTE — Assessment & Plan Note (Signed)
History of CAD status post proximal, mid and distal RCA overlapping stenting as well as proximal AV groove circumflex stenting with a known occluded OM branch which is old remotely. He had nonischemic Myoview May 2010. He has been asymptomatic from a cardiovascular point of view since.

## 2020-08-27 NOTE — Assessment & Plan Note (Signed)
History of essential hypertension a blood pressure measured today at 126/50. He is on amlodipine, atenolol and losartan.

## 2020-08-27 NOTE — Assessment & Plan Note (Signed)
History of carotid artery disease status post remote endarterectomy on the right. His most recent Dopplers performed 08/19/2020 revealed a widely patent endarterectomy site on the right with moderate left ICA stenosis. This will be repeated on an annual basis.

## 2020-08-27 NOTE — Progress Notes (Signed)
08/27/2020 Nathan Santiago [facility]   1930/11/02  [id number]  Primary Physician Nathan Bowen, MD Primary Cardiologist: Nathan Harp MD Nathan Santiago, [place], [place]  HPI:  Nathan Santiago is a 87 y.o.  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 He was formally a patient of Dr. Lowella Santiago. I last saw him in the office  08/26/2019.[name] His cardiovascular status profile is remarkable for remote tobacco abuse, 2 hypertension and hyperlipidemia. He is not diabetic. He does exercise [place] 5 days a week 25 minutes a day. He hasn't known cardiovascular disease status post stenting of his proximal mid and distal RCA using overlapping stents as well as the proximal AV groove circumflex with a known occluded OM branch which is old. He has normal LV function by 2-D echo performed 02/07/12 a nonischemic Myoview 2010. He denies chest pain, shortness of breath or claudication. He had carotid Dopplers performed 01/09/14 that showed a widely patent right carotid endarterectomy site with moderate left ICA stenosis remained stable. Since I saw him in January of this year he's noticed increasing bilateral lower extremity claudication which is lifestyle limiting. Dopplers have shown a decline in his ABIs bilaterally. He does have high frequency signal in his right greater than left iliac systems. I performed peripheral angiography on him 04/24/16 revealing a 75% distal abdominal aortic stenosis with a 60 mm pullback gradient. He also had a 95% calcified distal left common femoral artery stenosis and an 80% segmental mid right SFA stenosis with 2 vessel runoff bilaterally. I stented his distal abdominal aorta with a 10 x 29 mm long balloon expandable stent. His ABIs and increased from 0.7 bilaterally up to 1 on the right and 0.8 on the left. His claudication completely resolved. He was able to go on a trip to the [facility] and ambulated without  limitation. Since I sawhim a year ago he is complained of bilateral thigh claudication which is lifestyle limiting. His Dopplers do appear to show moderate iliac disease.   I performed outpatient peripheral angiography on 08/05/2018 revealing a patent distal abdominal aortic stent without a gradient and otherwise unchanged anatomy with a 95% calcified left common femoral artery stenosis and bilateral moderate SFA disease.  Since I saw him a year ago he continues to do well.  He is very active and walks without limitation or claudication.  He denies chest pain or shortness of breath.  Recent lower extremity arterial Doppler studies suggest a patent distal abdominal aortic stent.  Carotid Dopplers showed progression of disease in his left internal carotid artery.    Current Meds  Medication Sig  . acetaminophen (TYLENOL) 500 MG tablet Take 1 tablet (500 mg total) by mouth 2 (two) times daily as needed.  [name] amLODipine (NORVASC) 5 MG tablet Take 5 mg by mouth daily.  [name] Apoaequorin (PREVAGEN PO) Take 1 tablet by mouth daily.  [name] aspirin EC 81 MG tablet Take 1 tablet (81 mg total) by mouth 2 (two) times a day.  [name] atenolol (TENORMIN) 50 MG tablet Take 50 mg by mouth daily.  [name] atorvastatin (LIPITOR) 40 MG tablet Take 40 mg by mouth daily at 6 PM.   . benzonatate (TESSALON) 100 MG capsule Take 100 mg by mouth at bedtime as needed for cough.   . Biotin 10 MG CAPS Take 10 mg by mouth daily.  [name] bismuth subsalicylate (PEPTO BISMOL) 262 MG/15ML suspension Take 30  mLs by mouth every 6 (six) hours as needed for indigestion.  . cholecalciferol (VITAMIN D3) 25 MCG (1000 UT) tablet Take 1,000 Units by mouth daily.  . cilostazol (PLETAL) 100 MG tablet   . clopidogrel (PLAVIX) 75 MG tablet Take 75 mg by mouth every evening.   . diphenhydrAMINE (SOMINEX) 25 MG tablet diphenhydramine 25 mg tablet   25 mg by oral route.  . finasteride (PROSCAR) 5 MG tablet   . hydrochlorothiazide 25 MG tablet Take 25 mg by mouth  daily.  [name] losartan (COZAAR) 50 MG tablet Take 50 mg by mouth every evening.   . Magnesium 250 MG TABS Take 250 mg by mouth at bedtime.  . meclizine (ANTIVERT) 12.5 MG tablet   . Misc Natural Products (GLUCOSAMINE CHONDROITIN TRIPLE) TABS Take 2 tablets by mouth daily.   . Multiple Vitamin (MULTIVITAMIN WITH MINERALS) TABS tablet Take 1 tablet by mouth daily.  . Omega-3 Fatty Acids (FISH OIL) 1000 MG CAPS Take by mouth.  . pantoprazole (PROTONIX) 40 MG tablet TAKE 1 TABLET BY MOUTH TWICE A DAY  . potassium chloride SA (K-DUR,KLOR-CON) 20 MEQ tablet Take 20 mEq by mouth every evening.   . traMADol (ULTRAM) 50 MG tablet   . triamcinolone cream (KENALOG) 0.1 % Apply 1 application topically 2 (two) times daily. To back rash  . vitamin C (ASCORBIC ACID) 500 MG tablet Take 500 mg by mouth daily.     Allergies  Allergen Reactions  . Lisinopril Cough  . Niaspan [Niacin Er] Rash    Social History   Socioeconomic History  . Marital status: Married    Spouse name: Nathan Santiago  . Number of children: Not on file  . Years of education: BA  . Highest education level: Not on file  Occupational History  . Occupation: Retired  Tobacco Use  . Smoking status: Former Smoker    Packs/day: 2.00    Years: 20.00    Pack years: 40.00    Types: Cigarettes    Quit date: 07/31/1968    Years since quitting: 52.1  . Smokeless tobacco: Never Used  Vaping Use  . Vaping Use: Never used  Substance and Sexual Activity  . Alcohol use: Yes    Alcohol/week: 4.0 standard drinks    Types: 4 Glasses of wine per week  . Drug use: No  . Sexual activity: Not Currently  Other Topics Concern  . Not on file  Social History Narrative   Lives with wife   Caffeine use: Decaf coffee, tea   Left handed   Social Determinants of Health   Financial Resource Strain: Not on file  Food Insecurity: Not on file  Transportation Needs: Not on file  Physical Activity: Not on file  Stress: Not on file  Social Connections: Not on  file  Intimate Partner Violence: Not on file     Review of Systems: General: negative for chills, fever, night sweats or weight changes.  Cardiovascular: negative for chest pain, dyspnea on exertion, edema, orthopnea, palpitations, paroxysmal nocturnal dyspnea or shortness of breath Dermatological: negative for rash Respiratory: negative for cough or wheezing Urologic: negative for hematuria Abdominal: negative for nausea, vomiting, diarrhea, bright red blood per rectum, melena, or hematemesis Neurologic: negative for visual changes, syncope, or dizziness All other systems reviewed and are otherwise negative except as noted above.    Blood pressure (!) 126/50, pulse 71, height 5\' 4"  (1.626 m), weight 168 lb (76.2 kg).  General appearance: alert and no distress Neck: no adenopathy, no JVD,  supple, symmetrical, trachea midline, thyroid not enlarged, symmetric, no tenderness/mass/nodules and Bilateral carotid bruits left louder than right Lungs: clear to auscultation bilaterally Heart: regular rate and rhythm, S1, S2 normal, no murmur, click, rub or gallop Extremities: extremities normal, atraumatic, no cyanosis or edema Pulses: 2+ and symmetric Skin: Skin color, texture, turgor normal. No rashes or lesions Neurologic: Alert and oriented X 3, normal strength and tone. Normal symmetric reflexes. Normal coordination and gait  EKG sinus rhythm at 71 with nonspecific ST and T wave changes. I personally reviewed this EKG.  ASSESSMENT AND PLAN:   HYPERCHOLESTEROLEMIA History of hyperlipidemia on statin therapy with lipid profile performed 10/15/2019 revealing total cholesterol 140, LDL 69 and HDL 45.  Essential hypertension History of essential hypertension a blood pressure measured today at 126/50. He is on amlodipine, atenolol and losartan.  Coronary atherosclerosis History of CAD status post proximal, mid and distal RCA overlapping stenting as well as proximal AV groove circumflex  stenting with a known occluded OM branch which is old remotely. He had nonischemic Myoview May 2010. He has been asymptomatic from a cardiovascular point of view since.  Peripheral arterial disease (HCC) History of peripheral arterial disease with claudication status post peripheral angiography by myself 04/24/2016 revealing a 75% distal abdominal aortic stenosis with a 51mm pullback gradient. He had a 95% calcified left common femoral artery stenosis and 80% segmental mid right SFA stenosis with two-vessel runoff bilaterally. I stented his distal abdominal aorta with a 10 mm x 29 mm long balloon expandable stent which increased his ABIs bilaterally from 0.7 up to 1 on the right and to 0.8 on the left. Claudication resolved. His Dopplers have remained stable. I did perform peripheral angiography on him 08/05/2018 revealing a patent distal abdominal aortic stent without a gradient.  Bilateral carotid artery disease (HCC) History of carotid artery disease status post remote endarterectomy on the right. His most recent Dopplers performed 08/19/2020 revealed a widely patent endarterectomy site on the right with moderate left ICA stenosis. This will be repeated on an annual basis.      Nathan Harp MD FACP,FACC,FAHA, [facility] 08/27/2020 10:10 AM

## 2020-08-27 NOTE — Assessment & Plan Note (Signed)
History of peripheral arterial disease with claudication status post peripheral angiography by myself 04/24/2016 revealing a 75% distal abdominal aortic stenosis with a 77mm pullback gradient. He had a 95% calcified left common femoral artery stenosis and 80% segmental mid right SFA stenosis with two-vessel runoff bilaterally. I stented his distal abdominal aorta with a 10 mm x 29 mm long balloon expandable stent which increased his ABIs bilaterally from 0.7 up to 1 on the right and to 0.8 on the left. Claudication resolved. His Dopplers have remained stable. I did perform peripheral angiography on him 08/05/2018 revealing a patent distal abdominal aortic stent without a gradient.

## 2020-10-29 ENCOUNTER — Telehealth: Payer: Self-pay | Admitting: Cardiovascular Disease

## 2020-10-29 NOTE — Telephone Encounter (Signed)
Called patient, he states that the aching in his legs have worsened causing him not to be able to walk any distance at all without stopping. He states at last visit Dr.Berry mentioned doing some stents and he states that he is ready to do that. Patient has no redness, no color changes to his skin, no swelling, just aching that has really started to bother him. Patient also states no med changes.   I advised I would route a message to Dr.Berry and his nurse to make them aware and we would give him a call back on recommendations.

## 2020-10-29 NOTE — Telephone Encounter (Signed)
Patient states both of his legs have been aching and he cannot walk any distance. He states he is not having any swelling or any other symptoms.

## 2020-11-02 NOTE — Telephone Encounter (Signed)
Spoke with pt on the phone regarding opening in providers schedule. Pt able to make appointment on Friday, 4/8 at 2:15pm. Pt verbalizes understanding and is thankful for the call.

## 2020-11-05 ENCOUNTER — Other Ambulatory Visit: Payer: Self-pay

## 2020-11-05 ENCOUNTER — Ambulatory Visit: Payer: Medicare Other | Admitting: Cardiovascular Disease

## 2020-11-05 ENCOUNTER — Encounter: Payer: Self-pay | Admitting: Cardiovascular Disease

## 2020-11-05 VITALS — BP 176/62 | HR 88 | Ht 64.0 in | Wt 173.4 lb

## 2020-11-05 DIAGNOSIS — I6523 Occlusion and stenosis of bilateral carotid arteries: Secondary | ICD-10-CM

## 2020-11-05 DIAGNOSIS — I739 Peripheral vascular disease, unspecified: Secondary | ICD-10-CM | POA: Diagnosis not present

## 2020-11-05 NOTE — Patient Instructions (Signed)
Medication Instructions:  Your physician recommends that you continue on your current medications as directed. Please refer to the Current Medication list given to you today.  *If you need a refill on your cardiac medications before your next appointment, please call your pharmacy*   Testing/Procedures: Your physician has requested that you have a lower extremity arterial duplex. This test is an ultrasound of the arteries in the legs. It looks at arterial blood flow in the legs. Allow one hour for Lower Arterial scans. There are no restrictions or special instructions  Your physician has requested that you have an abdominal aorta duplex. During this test, an ultrasound is used to evaluate the aorta. Allow 30 minutes for this exam. Do not eat after midnight the day before and avoid carbonated beverages  Your physician has requested that you have an ankle brachial index (ABI). During this test an ultrasound and blood pressure cuff are used to evaluate the arteries that supply the arms and legs with blood. Allow thirty minutes for this exam. There are no restrictions or special instructions.  These procedures are done at [place]. 3rd Floor   Follow-Up: At [facility], you and your health needs are our priority.  As part of our continuing mission to provide you with exceptional heart care, we have created designated Provider Care Teams.  These Care Teams include your primary Cardiologist (physician) and Advanced Practice Providers (APPs -  Physician Assistants and Nurse Practitioners) who all work together to provide you with the care you need, when you need it.  We recommend signing up for the patient portal called "MyChart".  Sign up information is provided on this After Visit Summary.  MyChart is used to connect with patients for Virtual Visits (Telemedicine).  Patients are able to view lab/test results, encounter notes, upcoming appointments, etc.  Non-urgent messages can be sent to  your provider as well.   To learn more about what you can do with MyChart, go to [email or web address].    Your next appointment:   4 week(s)  The format for your next appointment:   In Person  Provider:   [name], MD

## 2020-11-05 NOTE — Assessment & Plan Note (Signed)
Mr. Nathan Santiago returns today because of worsening claudication.  He does have a history of peripheral arterial disease status post distal abdominal aorta PTA and stenting by myself 04/24/2016 with a 675% distal abdominal aortic stenosis and 60 mm pullback gradient.  I placed a 10 mm x 29 mm long balloon expandable stent with excellent result.  His ABIs increased from 0.7 up to 1 on the right and 0.8 on the left and his claudication resolved at that time.  He also had a 95% calcified distal left common femoral artery stenosis.  Waris Rodger angiogrammed him 08/05/2018 because of recurrent claudication revealing a patent distal abdominal aortic stent with no pullback gradient and fairly unchanged anatomy otherwise with a 95% calcified distal left common femoral artery stenosis and 95% distal right SFA with two-vessel runoff bilaterally.  He says that since I saw him a month ago his claudication has become dramatically worse making it difficult to walk even short distances.  He did have Doppler studies performed 08/19/2020 that did not suggest progression of disease in his iliac system.  I am going to repeat aortoiliac and lower extremity arterial Doppler studies.

## 2020-11-05 NOTE — Progress Notes (Signed)
11/05/2020 Nathan Santiago   1931-01-19  [id number]  Primary Physician Nathan Bowen, MD Primary Cardiologist: Nathan Harp MD Nathan Santiago, Nathan Santiago, Nathan Santiago  HPI:  Nathan Santiago is a 87 y.o.  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 He was formally a patient of Dr. Lowella Santiago. I last saw him in the office1/28/2022.  He is accompanied by his wife Nathan Santiago today.[name] His cardiovascular status profile is remarkable for remote tobacco abuse, 2 hypertension and hyperlipidemia. He is not diabetic. [place] 5 days a week 25 minutes a day. He hasn't known cardiovascular disease status post stenting of his proximal mid and distal RCA using overlapping stents as well as the proximal AV groove circumflex with a known occluded OM branch which is old. He has normal LV function by 2-D echo performed 02/07/12 a nonischemic Myoview 2010. He denies chest pain, shortness of breath or claudication. He had carotid Dopplers performed 01/09/14 that showed a widely patent right carotid endarterectomy site with moderate left ICA stenosis remained stable. Since I saw him in January of this year he's noticed increasing bilateral lower extremity claudication which is lifestyle limiting. Dopplers have shown a decline in his ABIs bilaterally. He does have high frequency signal in his right greater than left iliac systems. I performed peripheral angiography on him 04/24/16 revealing a 75% distal abdominal aortic stenosis with a 60 mm pullback gradient. He also had a 95% calcified distal left common femoral artery stenosis and an 80% segmental mid right SFA stenosis with 2 vessel runoff bilaterally. I stented his distal abdominal aorta with a 10 x 29 mm long balloon expandable stent. His ABIs and increased from 0.7 bilaterally up to 1 on the right and 0.8 on the left. His claudication completely resolved. He was able to go on a trip to  the [facility] and ambulated without limitation. Since I sawhim a year ago he is complained of bilateral thigh claudication which is lifestyle limiting. His Dopplers do appear to show moderate iliac disease.  I performedoutpatient peripheral angiography on 08/05/2018 revealing a patent distal abdominal aortic stent without a gradient and otherwise unchanged anatomy with a 95% calcified left common femoral artery stenosis and bilateral moderate SFA disease.  Since I saw him 3 months ago he has complained of worsening lifestyle limiting claudication now limiting him from walking even short distances.  The lower extremity arterial Doppler studies which I performed 08/19/2020 suggested that his aortic stent was widely patent.  He denies chest pain or shortness of breath.   Current Meds  Medication Sig  . acetaminophen (TYLENOL) 500 MG tablet Take 1 tablet (500 mg total) by mouth 2 (two) times daily as needed.  [name] amLODipine (NORVASC) 5 MG tablet Take 5 mg by mouth daily.  [name] Apoaequorin (PREVAGEN PO) Take 1 tablet by mouth daily.  [name] aspirin EC 81 MG tablet Take 1 tablet (81 mg total) by mouth 2 (two) times a day.  [name] atenolol (TENORMIN) 50 MG tablet Take 50 mg by mouth daily.  [name] atorvastatin (LIPITOR) 40 MG tablet Take 40 mg by mouth daily at 6 PM.   . benzonatate (TESSALON) 100 MG capsule Take 100 mg by mouth at bedtime as needed for cough.   . Biotin 10 MG CAPS Take 10 mg by mouth daily.  [name] bismuth subsalicylate (PEPTO BISMOL) 262 MG/15ML suspension Take 30 mLs by mouth every  6 (six) hours as needed for indigestion.  . cholecalciferol (VITAMIN D3) 25 MCG (1000 UT) tablet Take 1,000 Units by mouth daily.  . cilostazol (PLETAL) 100 MG tablet   . clopidogrel (PLAVIX) 75 MG tablet Take 75 mg by mouth every evening.   . diphenhydrAMINE (SOMINEX) 25 MG tablet diphenhydramine 25 mg tablet   25 mg by oral route.  . finasteride (PROSCAR) 5 MG tablet   . hydrochlorothiazide 25 MG tablet Take 25 mg by  mouth daily.  [name] losartan (COZAAR) 50 MG tablet Take 50 mg by mouth every evening.   . Magnesium 250 MG TABS Take 250 mg by mouth at bedtime.  . meclizine (ANTIVERT) 12.5 MG tablet   . Misc Natural Products (GLUCOSAMINE CHONDROITIN TRIPLE) TABS Take 2 tablets by mouth daily.   . Multiple Vitamin (MULTIVITAMIN WITH MINERALS) TABS tablet Take 1 tablet by mouth daily.  . Omega-3 Fatty Acids (FISH OIL) 1000 MG CAPS Take by mouth.  . pantoprazole (PROTONIX) 40 MG tablet TAKE 1 TABLET BY MOUTH TWICE A DAY  . potassium chloride SA (K-DUR,KLOR-CON) 20 MEQ tablet Take 20 mEq by mouth every evening.   . traMADol (ULTRAM) 50 MG tablet   . triamcinolone cream (KENALOG) 0.1 % Apply 1 application topically 2 (two) times daily. To back rash  . vitamin C (ASCORBIC ACID) 500 MG tablet Take 500 mg by mouth daily.     Allergies  Allergen Reactions  . Lisinopril Cough  . Niaspan [Niacin Er] Rash    Social History   Socioeconomic History  . Marital status: Married    Spouse name: Nathan Santiago  . Number of children: Not on file  . Years of education: BA  . Highest education level: Not on file  Occupational History  . Occupation: Retired  Tobacco Use  . Smoking status: Former Smoker    Packs/day: 2.00    Years: 20.00    Pack years: 40.00    Types: Cigarettes    Quit date: 07/31/1968    Years since quitting: 52.3  . Smokeless tobacco: Never Used  Vaping Use  . Vaping Use: Never used  Substance and Sexual Activity  . Alcohol use: Yes    Alcohol/week: 4.0 standard drinks    Types: 4 Glasses of wine per week  . Drug use: No  . Sexual activity: Not Currently  Other Topics Concern  . Not on file  Social History Narrative   Lives with wife   Caffeine use: Decaf coffee, tea   Left handed   Social Determinants of Health   Financial Resource Strain: Not on file  Food Insecurity: Not on file  Transportation Needs: Not on file  Physical Activity: Not on file  Stress: Not on file  Social Connections:  Not on file  Intimate Partner Violence: Not on file     Review of Systems: General: negative for chills, fever, night sweats or weight changes.  Cardiovascular: negative for chest pain, dyspnea on exertion, edema, orthopnea, palpitations, paroxysmal nocturnal dyspnea or shortness of breath Dermatological: negative for rash Respiratory: negative for cough or wheezing Urologic: negative for hematuria Abdominal: negative for nausea, vomiting, diarrhea, bright red blood per rectum, melena, or hematemesis Neurologic: negative for visual changes, syncope, or dizziness All other systems reviewed and are otherwise negative except as noted above.    Blood pressure (!) 176/62, pulse 88, height 5\' 4"  (1.626 m), weight 173 lb 6.4 oz (78.7 kg), SpO2 96 %.  General appearance: alert and no distress Neck: no adenopathy, no  JVD, supple, symmetrical, trachea midline, thyroid not enlarged, symmetric, no tenderness/mass/nodules and Soft left carotid bruit Lungs: clear to auscultation bilaterally Heart: regular rate and rhythm, S1, S2 normal, no murmur, click, rub or gallop Extremities: extremities normal, atraumatic, no cyanosis or edema Pulses: 2+ and symmetric Skin: Skin color, texture, turgor normal. No rashes or lesions Neurologic: Alert and oriented X 3, normal strength and tone. Normal symmetric reflexes. Normal coordination and gait  EKG not performed today  ASSESSMENT AND PLAN:   Peripheral arterial disease [facility]) Mr. Macleod returns today because of worsening claudication.  He does have a history of peripheral arterial disease status post distal abdominal aorta PTA and stenting by myself 04/24/2016 with a 675% distal abdominal aortic stenosis and 60 mm pullback gradient.  I placed a 10 mm x 29 mm long balloon expandable stent with excellent result.  His ABIs increased from 0.7 up to 1 on the right and 0.8 on the left and his claudication resolved at that time.  He also had a 95% calcified  distal left common femoral artery stenosis.  Leelan Rajewski angiogrammed him 08/05/2018 because of recurrent claudication revealing a patent distal abdominal aortic stent with no pullback gradient and fairly unchanged anatomy otherwise with a 95% calcified distal left common femoral artery stenosis and 95% distal right SFA with two-vessel runoff bilaterally.  He says that since I saw him a month ago his claudication has become dramatically worse making it difficult to walk even short distances.  He did have Doppler studies performed 08/19/2020 that did not suggest progression of disease in his iliac system.  I am going to repeat aortoiliac and lower extremity arterial Doppler studies.  Bilateral carotid artery disease (HCC) History of remote right carotid endarterectomy with recent carotid Dopplers performed 08/19/2020 revealing a widely patent endarterectomy site site with moderate left ICA stenosis.  His carotid Dopplers will be repeated in January 2023.      Nathan Harp MD FACP,FACC,FAHA, [facility] 11/05/2020 3:06 PM

## 2020-11-05 NOTE — Assessment & Plan Note (Signed)
History of remote right carotid endarterectomy with recent carotid Dopplers performed 08/19/2020 revealing a widely patent endarterectomy site site with moderate left ICA stenosis.  His carotid Dopplers will be repeated in January 2023.

## 2020-11-17 ENCOUNTER — Ambulatory Visit (HOSPITAL_BASED_OUTPATIENT_CLINIC_OR_DEPARTMENT_OTHER)
Admission: RE | Admit: 2020-11-17 | Discharge: 2020-11-17 | Disposition: A | Discharge status: Home / Self Care | Payer: Medicare Other | Source: Ambulatory Visit | Attending: Cardiovascular Disease | Admitting: Cardiovascular Disease

## 2020-11-17 ENCOUNTER — Other Ambulatory Visit: Payer: Self-pay

## 2020-11-17 ENCOUNTER — Ambulatory Visit (HOSPITAL_COMMUNITY)
Admission: RE | Admit: 2020-11-17 | Discharge: 2020-11-17 | Disposition: A | Discharge status: Home / Self Care | Payer: Medicare Other | Source: Ambulatory Visit | Attending: Cardiovascular Disease | Admitting: Cardiovascular Disease

## 2020-11-17 ENCOUNTER — Other Ambulatory Visit: Payer: Self-pay | Admitting: Cardiovascular Disease

## 2020-11-17 DIAGNOSIS — I739 Peripheral vascular disease, unspecified: Secondary | ICD-10-CM | POA: Insufficient documentation

## 2020-11-17 DIAGNOSIS — Z95828 Presence of other vascular implants and grafts: Secondary | ICD-10-CM | POA: Diagnosis not present

## 2020-11-19 ENCOUNTER — Other Ambulatory Visit (HOSPITAL_BASED_OUTPATIENT_CLINIC_OR_DEPARTMENT_OTHER): Payer: Self-pay

## 2020-11-19 ENCOUNTER — Ambulatory Visit: Payer: Medicare Other | Attending: Internal Medicine

## 2020-11-19 ENCOUNTER — Other Ambulatory Visit: Payer: Self-pay

## 2020-11-19 DIAGNOSIS — Z23 Encounter for immunization: Secondary | ICD-10-CM

## 2020-11-19 MED ORDER — COVID-19 MRNA VACC (MODERNA) 100 MCG/0.5ML IM SUSP
INTRAMUSCULAR | 0 refills | Status: DC
  Filled 2020-11-19: qty 0.25, 1d supply, fill #0

## 2020-11-19 NOTE — Progress Notes (Signed)
   Covid-19 Vaccination Clinic  Name:  OWAIS PRUETT    MRN: [id number] DOB: Jun 01, 1931  11/19/2020  Mr. Hepworth was observed post Covid-19 immunization for 15 minutes without incident. He was provided with Vaccine Information Sheet and instruction to access the V-Safe system.   Mr. Stangelo was instructed to call 911 with any severe reactions post vaccine: [name] Difficulty breathing  . Swelling of face and throat  . A fast heartbeat  . A bad rash all over body  . Dizziness and weakness   Immunizations Administered    Name Date Dose VIS Date Route   Moderna Covid-19 Booster Vaccine 11/19/2020  3:31 PM 0.25 mL 05/19/2020 Intramuscular   Manufacturer: Moderna   Lot: [id number]   [place]: [id number]

## 2020-12-01 ENCOUNTER — Ambulatory Visit: Payer: Medicare Other | Admitting: Podiatry

## 2020-12-01 ENCOUNTER — Encounter: Payer: Self-pay | Admitting: Podiatry

## 2020-12-01 ENCOUNTER — Other Ambulatory Visit: Payer: Self-pay

## 2020-12-01 DIAGNOSIS — M722 Plantar fascial fibromatosis: Secondary | ICD-10-CM

## 2020-12-01 MED ORDER — TRIAMCINOLONE ACETONIDE 10 MG/ML IJ SUSP
20.0000 mg | Freq: Once | INTRAMUSCULAR | Status: AC
  Administered 2020-12-01: 20 mg

## 2020-12-01 NOTE — Progress Notes (Signed)
Subjective:   Patient ID: Nathan Santiago, male   DOB: 87 y.o.   MRN: [id number]   HPI Patient states he developed acute inflammation in the bottom of both heels in the last several weeks   ROS      Objective:  Physical Exam  Acute Planter fasciitis with inflammation fluid of the medial band bilateral     Assessment:  Acute pain plantar heel at insertion calcaneus     Plan:  H&P reviewed sterile prep injected the plantar fascial bilateral 3 mg Kenalog 5 mg Xylocaine advised on reduced activity support shoes reappoint as symptoms indicate

## 2020-12-03 ENCOUNTER — Other Ambulatory Visit: Payer: Self-pay

## 2020-12-03 ENCOUNTER — Ambulatory Visit: Payer: Medicare Other | Admitting: Cardiovascular Disease

## 2020-12-03 ENCOUNTER — Encounter: Payer: Self-pay | Admitting: Cardiovascular Disease

## 2020-12-03 VITALS — BP 162/60 | HR 82 | Ht 64.0 in | Wt 173.2 lb

## 2020-12-03 DIAGNOSIS — I739 Peripheral vascular disease, unspecified: Secondary | ICD-10-CM | POA: Diagnosis not present

## 2020-12-03 NOTE — Patient Instructions (Signed)

## 2020-12-03 NOTE — Progress Notes (Signed)
Nathan Santiago returns today for follow-up.  I did Dopplers on him 11/17/2020 that showed a patent distal aortic stent, patent iliac arteries.  He had a high-frequency signal in his distal left common femoral artery and distal right SFA.  He ended up seeing Dr. Paulla Dolly, his podiatrist, who injected him with Kenalog in both feet and his symptoms have completely resolved.  There is no further need for vascular work-up at this time.  I will see him back in 1 year for follow-up.  Lorretta Harp, M.D., [place], [facility], [place] [address] [place], [place]  [id number]  [phone] 12/03/2020 3:50 PM

## 2020-12-07 ENCOUNTER — Telehealth: Payer: Self-pay | Admitting: Cardiovascular Disease

## 2020-12-07 NOTE — Telephone Encounter (Signed)
Spoke to patient. Patient is aware will  Defer to Dr Gwenlyn Found patient will like to proceed with procedure. Patient had appointment on 12/03/20.

## 2020-12-07 NOTE — Telephone Encounter (Signed)
Pt said he and Dr. Gwenlyn Found discussed surgery on both of his legs and pt did not want to proceed with surgery, now his legs are in pain and he would like to discuss proceeding with the surgery

## 2020-12-08 NOTE — Telephone Encounter (Signed)
I saw pt in office earlier this week and his Sx have completely resolved with steroid injections in his feet by Dr Paulla Dolly. No indication at this point for a PV procedure

## 2020-12-09 NOTE — Telephone Encounter (Signed)
Left message for pt to call back  °

## 2020-12-09 NOTE — Telephone Encounter (Signed)
Spoke with pt regarding issues with his legs. Pt states that when he saw Dr. Gwenlyn Found on 12/03/20 that his legs were feeling better and that he at this time felt that there was no need to move forward with a pv procedure at this time. Pt states that he is now in agony with his legs. Pt would like to know what options we have to improve blood flow in the legs. Will forward to Dr. Gwenlyn Found to advise.

## 2020-12-09 NOTE — Telephone Encounter (Signed)
Patient was returning call 

## 2020-12-09 NOTE — Telephone Encounter (Signed)
He needs a surgical procedure (LCFA) endarterectomy and patch angioplasty after which, in a staged fashion, I can fix his right leg

## 2020-12-15 ENCOUNTER — Ambulatory Visit: Payer: Medicare Other | Admitting: Cardiovascular Disease

## 2021-01-06 ENCOUNTER — Emergency Department (HOSPITAL_BASED_OUTPATIENT_CLINIC_OR_DEPARTMENT_OTHER): Payer: Medicare Other

## 2021-01-06 ENCOUNTER — Encounter (HOSPITAL_BASED_OUTPATIENT_CLINIC_OR_DEPARTMENT_OTHER): Payer: Self-pay | Admitting: Emergency Medicine

## 2021-01-06 ENCOUNTER — Emergency Department (HOSPITAL_BASED_OUTPATIENT_CLINIC_OR_DEPARTMENT_OTHER)
Admission: EM | Admit: 2021-01-06 | Discharge: 2021-01-06 | Disposition: A | Discharge status: Home / Self Care | Payer: Medicare Other | Attending: Emergency Medicine | Admitting: Emergency Medicine

## 2021-01-06 ENCOUNTER — Other Ambulatory Visit: Payer: Self-pay

## 2021-01-06 DIAGNOSIS — Z7982 Long term (current) use of aspirin: Secondary | ICD-10-CM | POA: Insufficient documentation

## 2021-01-06 DIAGNOSIS — R5383 Other fatigue: Secondary | ICD-10-CM | POA: Diagnosis present

## 2021-01-06 DIAGNOSIS — I10 Essential (primary) hypertension: Secondary | ICD-10-CM

## 2021-01-06 DIAGNOSIS — Z96652 Presence of left artificial knee joint: Secondary | ICD-10-CM | POA: Diagnosis not present

## 2021-01-06 DIAGNOSIS — Z87891 Personal history of nicotine dependence: Secondary | ICD-10-CM | POA: Insufficient documentation

## 2021-01-06 DIAGNOSIS — Z79899 Other long term (current) drug therapy: Secondary | ICD-10-CM | POA: Insufficient documentation

## 2021-01-06 DIAGNOSIS — Z20822 Contact with and (suspected) exposure to covid-19: Secondary | ICD-10-CM | POA: Insufficient documentation

## 2021-01-06 DIAGNOSIS — I2510 Atherosclerotic heart disease of native coronary artery without angina pectoris: Secondary | ICD-10-CM | POA: Insufficient documentation

## 2021-01-06 LAB — COMPREHENSIVE METABOLIC PANEL
ALT: 21 U/L (ref 0–44)
AST: 20 U/L (ref 15–41)
Albumin: 4.3 g/dL (ref 3.5–5.0)
Alkaline Phosphatase: 72 U/L (ref 38–126)
Anion gap: 12 (ref 5–15)
BUN: 17 mg/dL (ref 8–23)
CO2: 26 mmol/L (ref 22–32)
Calcium: 10 mg/dL (ref 8.9–10.3)
Chloride: 105 mmol/L (ref 98–111)
Creatinine, Ser: 1.1 mg/dL (ref 0.61–1.24)
GFR, Estimated: >60 mL/min (ref >60)
Glucose, Bld: 89 mg/dL (ref 70–99)
Potassium: 3.4 mmol/L — ABNORMAL LOW (ref 3.5–5.1)
Sodium: 143 mmol/L (ref 135–145)
Total Bilirubin: 0.6 mg/dL (ref 0.3–1.2)
Total Protein: 7.7 g/dL (ref 6.5–8.1)

## 2021-01-06 LAB — CBC WITH DIFFERENTIAL/PLATELET
Abs Immature Granulocytes: 0.02 10*3/uL (ref 0.00–0.07)
Basophils Absolute: 0 10*3/uL (ref 0.0–0.1)
Basophils Relative: 0 %
Eosinophils Absolute: 0.1 10*3/uL (ref 0.0–0.5)
Eosinophils Relative: 2 %
HCT: 41.5 % (ref 39.0–52.0)
Hemoglobin: 14.1 g/dL (ref 13.0–17.0)
Immature Granulocytes: 0 %
Lymphocytes Relative: 15 %
Lymphs Abs: 1.2 10*3/uL (ref 0.7–4.0)
MCH: 29.7 pg (ref 26.0–34.0)
MCHC: 34 g/dL (ref 30.0–36.0)
MCV: 87.4 fL (ref 80.0–100.0)
Monocytes Absolute: 0.6 10*3/uL (ref 0.1–1.0)
Monocytes Relative: 7 %
Neutro Abs: 6.2 10*3/uL (ref 1.7–7.7)
Neutrophils Relative %: 76 %
Platelets: 220 10*3/uL (ref 150–400)
RBC: 4.75 MIL/uL (ref 4.22–5.81)
RDW: 11.9 % (ref 11.5–15.5)
WBC: 8.2 10*3/uL (ref 4.0–10.5)
nRBC: 0 % (ref 0.0–0.2)

## 2021-01-06 LAB — URINALYSIS, ROUTINE W REFLEX MICROSCOPIC
Bilirubin Urine: NEGATIVE
Glucose, UA: NEGATIVE mg/dL
Hgb urine dipstick: NEGATIVE
Ketones, ur: NEGATIVE mg/dL
Leukocytes,Ua: NEGATIVE
Nitrite: NEGATIVE
Protein, ur: NEGATIVE mg/dL
Specific Gravity, Urine: 1.02 (ref 1.005–1.030)
pH: 5 (ref 5.0–8.0)

## 2021-01-06 LAB — RESP PANEL BY RT-PCR (FLU A&B, COVID) ARPGX2
Influenza A by PCR: NEGATIVE
Influenza B by PCR: NEGATIVE
SARS Coronavirus 2 by RT PCR: NEGATIVE

## 2021-01-06 LAB — LIPASE, BLOOD: Lipase: 17 U/L (ref 11–51)

## 2021-01-06 LAB — SEDIMENTATION RATE: Sed Rate: 25 mm/hr — ABNORMAL HIGH (ref 0–16)

## 2021-01-06 LAB — TROPONIN I (HIGH SENSITIVITY): Troponin I (High Sensitivity): 6 ng/L (ref <18)

## 2021-01-06 MED ORDER — PREDNISONE 10 MG PO TABS
15.0000 mg | ORAL_TABLET | Freq: Once | ORAL | Status: AC
  Administered 2021-01-06: 15 mg via ORAL
  Filled 2021-01-06: qty 2

## 2021-01-06 MED ORDER — PREDNISONE 5 MG PO TABS: 15.0000 mg | ORAL_TABLET | Freq: Every day | ORAL | 0 refills | Status: AC

## 2021-01-06 MED ORDER — SODIUM CHLORIDE 0.9 % IV BOLUS
1000.0000 mL | Freq: Once | INTRAVENOUS | Status: AC
  Administered 2021-01-06: 1000 mL via INTRAVENOUS

## 2021-01-06 NOTE — ED Triage Notes (Signed)
Ongoing weakness for more than a month , has seen the dr  but he still feels weak  denies n/v/cp

## 2021-01-06 NOTE — ED Provider Notes (Signed)
[facility] EMERGENCY DEPT Provider Note   CSN: [id number] Arrival date & time: 01/06/21  1739     History Chief Complaint  Patient presents with   Weakness    Nathan Santiago is a 87 y.o. male.  87 yo M with a chief complaint of feeling fatigued.  This been going on for some time but worsening over the past few days.  He feels like he is unable to get up out of a chair now.  Denies headache denies chest pain denies abdominal pain denies shortness of breath.  Denies recent fall.  He has vertigo off and on since the 60s.  Is currently having about an hour lasting slightly longer than normal.  Feels the same as it has previously.  Went to vestibular rehab this week and feels like the weakness is gotten worse.  Feels like he is been eating and drinking normally.  Denies any nausea vomiting or diarrhea.  Denies dark stool or blood in the stool.  The history is provided by the patient.  Weakness Severity:  Moderate Onset quality:  Gradual Duration:  2 weeks Timing:  Intermittent Progression:  Waxing and waning Chronicity:  New Relieved by:  Nothing Worsened by:  Nothing Ineffective treatments:  None tried Associated symptoms: dizziness   Associated symptoms: no abdominal pain, no arthralgias, no chest pain, no diarrhea, no fever, no headaches, no myalgias, no shortness of breath and no vomiting       Past Medical History:  Diagnosis Date   AAA (abdominal aortic aneurysm) ([place])    asymptomatic   AAA (abdominal aortic aneurysm) ([place]) 2010   peripheral  angiogram-- bilateral SFA DISEASE  and 60 to 70% infrarenaal abd. aortic stenosis with 15 -mm gradient   Adenomatous colon polyp 11/2003   CAD (coronary artery disease)    Gait abnormality 02/13/2017   Gastroparesis    pt denies   GERD (gastroesophageal reflux disease)    w/ LPR   History of kidney stones    x2   Hyperlipidemia    Hypertension    Peripheral arterial disease ([place])    RCEA  by Dr Lemar Livings    PUD (peptic ulcer disease)    pt unaware   Vertigo     Patient Active Problem List   Diagnosis Date Noted   Status post total knee replacement, left 01/03/2019   Pain in left knee 08/05/2018   Pain in right knee 08/05/2018   Chronic cough 04/15/2018   Foreign body in ear 12/21/2017   Hematuria 10/10/2017   Gait abnormality 02/13/2017   UTI (urinary tract infection) 11/22/2016   Vertigo 11/22/2016   Hypokalemia 11/22/2016   Urinary frequency 09/08/2016   Claudication ([place]) 04/24/2016   Sensorineural hearing loss (SNHL), bilateral 03/09/2016   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   Bilateral carotid artery disease ([place]) 08/07/2014   Peripheral arterial disease ([place]) 08/06/2013   GASTROPARESIS 02/25/2009   COLONIC POLYPS, ADENOMATOUS, HX OF 02/22/2009   HYPERCHOLESTEROLEMIA 09/26/2007   Essential hypertension 09/26/2007   Coronary atherosclerosis 09/26/2007   Allergic rhinitis 09/26/2007   GERD 09/26/2007   Peptic ulcer 26/94/8546   UMBILICAL HERNIA 27/09/5007   NEPHROLITHIASIS 09/26/2007   BLADDER STONE 09/26/2007    Past Surgical History:  Procedure Laterality Date   ABDOMINAL AORTOGRAM W/LOWER EXTREMITY Bilateral 08/05/2018   Procedure: ABDOMINAL AORTOGRAM W/LOWER EXTREMITY;  Surgeon: Lorretta Harp, MD;  Location: [facility];  Service: Cardiovascular;  Laterality: Bilateral;   AORTOGRAM  04/24/2016    Abdominal  aortogram, bilateral iliac angiogram, bifemoral runoff   CARDIAC CATHETERIZATION  05/12/2005   RCA   carotid doppler  01/24/2013   RICA endarterectomy,left CCA 0-49%; left bulb and prox ICA 50-69%; bilaateral subclavian < 50%   CAROTID ENDARTERECTOMY  11/01/2010   CATARACT EXTRACTION     COLONOSCOPY     CORONARY ANGIOPLASTY  05/18/2005   2 STENTS distal RCA AND PROXIMAL-MID RCA   5 total stents per pt.   CYSTOSCOPY/URETEROSCOPY/HOLMIUM LASER/STENT PLACEMENT Left 01/11/2018   Procedure: LEFT URETEROSCOPY/HOLMIUM LASER/STENT PLACEMENT;   Surgeon: Ardis Hughs, MD;  Location: [facility];  Service: Urology;  Laterality: Left;   DOPPLER ECHOCARDIOGRAPHY  02/07/2012   EF 55%,SHOWED NO ISCHEMIA    HERNIA REPAIR     umbilical   lower arterial  doppler  02/04/2013   aotra 1.5 x 1.5 cm; distal abd aorta 70-99%,proximal common iliac arteries -very stenotic with increased velocities>50%,may be falsely elevated as a result of residual plaque from the distal aorta stenosis   lower extremity doppler  June 18 ,2013   ABI'S ABNORMAL, RABI was 0.88 and LABI 0.75 ,with 3-vessel  run off   NM MYOVIEW LTD  MAY 23,2011   showed no significant ischemia;   NM MYOVIEW LTD  04/22/2008   ef 77%,exercise capcity 6 METS ,exaggerated blood pressure response to exercise   PERIPHERAL VASCULAR CATHETERIZATION N/A 04/24/2016   Procedure: Lower Extremity Angiography;  Surgeon: Lorretta Harp, MD;  Location: [facility];  Service: Cardiovascular;  Laterality: N/A;   PERIPHERAL VASCULAR CATHETERIZATION  04/24/2016   Procedure: Peripheral Vascular Intervention;  Surgeon: Lorretta Harp, MD;  Location: [facility];  Service: Cardiovascular;;  Aorta   retrograde central aortic catheterization  05/19/2005   cutting balloon atherectomy, c-circ stenosis with DES STENTING CYPHER   TONSILLECTOMY     TOTAL KNEE ARTHROPLASTY Left 01/03/2019   Procedure: TOTAL KNEE ARTHROPLASTY;  Surgeon: Netta Cedars, MD;  Location: [facility];  Service: Orthopedics;  Laterality: Left;  with IS block   TRANSURETHRAL RESECTION OF PROSTATE N/A 09/08/2016   Procedure: TRANSURETHRAL RESECTION OF THE PROSTATE (TURP);  Surgeon: Ardis Hughs, MD;  Location: [facility];  Service: Urology;  Laterality: N/A;   TRANSURETHRAL RESECTION OF PROSTATE     10-10-17  Dr. Louis Meckel   TRANSURETHRAL RESECTION OF PROSTATE N/A 10/10/2017   Procedure: TRANSURETHRAL RESECTION OF THE PROSTATE (TURP);  Surgeon: Ardis Hughs, MD;  Location: [facility];  Service: Urology;  Laterality: N/A;    VASECTOMY         Family History  Problem Relation Age of Onset   Ovarian cancer Sister    Heart disease Father    Brain cancer Brother        Tumor    Social History   Tobacco Use   Smoking status: Former    Packs/day: 2.00    Years: 20.00    Pack years: 40.00    Types: Cigarettes    Quit date: 07/31/1968    Years since quitting: 52.4   Smokeless tobacco: Never  Vaping Use   Vaping Use: Never used  Substance Use Topics   Alcohol use: Yes    Alcohol/week: 4.0 standard drinks    Types: 4 Glasses of wine per week   Drug use: No    Home Medications Prior to Admission medications   Medication Sig Start Date End Date Taking? Authorizing Provider  predniSONE (DELTASONE) 5 MG tablet Take 3 tablets (15 mg total) by mouth daily with breakfast  for 5 days. 2 tabs po daily x 4 days 01/06/21 01/11/21 Yes Deno Etienne, DO  acetaminophen (TYLENOL) 500 MG tablet Take 1 tablet (500 mg total) by mouth 2 (two) times daily as needed. 01/13/19   Royal Hawthorn, NP  amLODipine (NORVASC) 5 MG tablet Take 5 mg by mouth daily.    [provider]  Apoaequorin (PREVAGEN PO) Take 1 tablet by mouth daily.    [provider]  aspirin EC 81 MG tablet Take 1 tablet (81 mg total) by mouth 2 (two) times a day. 01/03/19   Netta Cedars, MD  atenolol (TENORMIN) 50 MG tablet Take 50 mg by mouth daily.    [provider]  atorvastatin (LIPITOR) 40 MG tablet Take 40 mg by mouth daily at 6 PM.  11/14/16   [provider]  benzonatate (TESSALON) 100 MG capsule Take 100 mg by mouth at bedtime as needed for cough.  08/02/14   [provider]  Biotin 10 MG CAPS Take 10 mg by mouth daily.    [provider]  bismuth subsalicylate (PEPTO BISMOL) 262 MG/15ML suspension Take 30 mLs by mouth every 6 (six) hours as needed for indigestion.    [provider]  cholecalciferol (VITAMIN D) 25 MCG (1000 UNIT) tablet daily. 02/07/10   [provider]  cholecalciferol  (VITAMIN D3) 25 MCG (1000 UT) tablet Take 1,000 Units by mouth daily.    [provider]  cilostazol (PLETAL) 100 MG tablet  02/23/19   [provider]  clopidogrel (PLAVIX) 75 MG tablet Take 75 mg by mouth every evening.     [provider]  COVID-19 mRNA vaccine, Moderna, 100 MCG/0.5ML injection Inject into the muscle. 11/19/20   Carlyle Basques, MD  diazepam (VALIUM) 5 MG tablet TAKE 1/2 TO 1 TABLET BY  MOUTH EVERY 8 HOURS AS  NEEDED (CAUTION: CAUSES  SEDATION) 01/21/18   [provider]  diphenhydrAMINE (SOMINEX) 25 MG tablet diphenhydramine 25 mg tablet   25 mg by oral route.    [provider]  finasteride (PROSCAR) 5 MG tablet  02/23/19   [provider]  hydrochlorothiazide 25 MG tablet Take 25 mg by mouth daily.    [provider]  HYDROcodone bit-homatropine (HYCODAN) 5-1.5 MG/5ML syrup Take 5 mLs by mouth every 6 (six) hours. 11/29/20   [provider]  losartan (COZAAR) 50 MG tablet Take 50 mg by mouth every evening.     [provider]  Magnesium 250 MG TABS Take 250 mg by mouth at bedtime.    [provider]  meclizine (ANTIVERT) 12.5 MG tablet  04/22/19   [provider]  Misc Natural Products (GLUCOSAMINE CHONDROITIN TRIPLE) TABS Take 2 tablets by mouth daily.     [provider]  Multiple Vitamin (MULTIVITAMIN WITH MINERALS) TABS tablet Take 1 tablet by mouth daily.    [provider]  Omega-3 Fatty Acids (FISH OIL BURP-LESS) 1000 MG CAPS TAKE 1 CAPSULE BY MOUTH ONCE EVERY DAY 05/25/03   [provider]  pantoprazole (PROTONIX) 40 MG tablet TAKE 1 TABLET BY MOUTH TWICE A DAY 10/21/18   Byrum, Rose Fillers, MD  potassium chloride SA (K-DUR,KLOR-CON) 20 MEQ tablet Take 20 mEq by mouth every evening.  07/16/13   [provider]  tamsulosin (FLOMAX) 0.4 MG CAPS capsule Take 2 capsules by mouth at bedtime. 01/19/15   [provider]  traMADol Veatrice Bourbon) 50 MG  tablet  01/16/19   [provider]  triamcinolone cream (KENALOG)  0.1 % Apply 1 application topically 2 (two) times daily. To back rash    [provider]  vitamin C (ASCORBIC ACID) 500 MG tablet Take 500 mg by mouth daily.    [provider]  vitamin C (ASCORBIC ACID) 500 MG tablet daily. 02/07/10   [provider]    Allergies    Lisinopril and Niaspan [niacin er]  Review of Systems   Review of Systems  Constitutional:  Negative for chills and fever.  HENT:  Negative for congestion and facial swelling.   Eyes:  Negative for discharge and visual disturbance.  Respiratory:  Negative for shortness of breath.   Cardiovascular:  Negative for chest pain and palpitations.  Gastrointestinal:  Negative for abdominal pain, diarrhea and vomiting.  Musculoskeletal:  Negative for arthralgias and myalgias.  Skin:  Negative for color change and rash.  Neurological:  Positive for dizziness and weakness. Negative for tremors, syncope and headaches.  Psychiatric/Behavioral:  Negative for confusion and dysphoric mood.    Physical Exam Updated Vital Signs BP (!) 205/74   Pulse 73   Temp 99.1 F (37.3 C) (Oral)   Resp 19   Ht '5\' 4"'  (1.626 m)   Wt 73.9 kg   SpO2 95%   BMI 27.98 kg/m   Physical Exam Vitals and nursing note reviewed.  Constitutional:      Appearance: He is well-developed.  HENT:     Head: Normocephalic and atraumatic.  Eyes:     Pupils: Pupils are equal, round, and reactive to light.  Neck:     Vascular: No JVD.  Cardiovascular:     Rate and Rhythm: Normal rate and regular rhythm.     Heart sounds: No murmur heard.   No friction rub. No gallop.  Pulmonary:     Effort: No respiratory distress.     Breath sounds: No wheezing.  Abdominal:     General: There is no distension.     Tenderness: There is no abdominal tenderness. There is no guarding or rebound.  Musculoskeletal:        General: Normal range of motion.     Cervical back:  Normal range of motion and neck supple.  Skin:    Coloration: Skin is not pale.     Findings: No rash.  Neurological:     Mental Status: He is alert and oriented to person, place, and time.  Psychiatric:        Behavior: Behavior normal.    ED Results / Procedures / Treatments   Labs (all labs ordered are listed, but only abnormal results are displayed) Labs Reviewed  COMPREHENSIVE METABOLIC PANEL - Abnormal; Notable for the following components:      Result Value   Potassium 3.4 (*)    All other components within normal limits  SEDIMENTATION RATE - Abnormal; Notable for the following components:   Sed Rate 25 (*)    All other components within normal limits  RESP PANEL BY RT-PCR (FLU A&B, COVID) ARPGX2  CBC WITH DIFFERENTIAL/PLATELET  LIPASE, BLOOD  URINALYSIS, ROUTINE W REFLEX MICROSCOPIC  C-REACTIVE PROTEIN  TROPONIN I (HIGH SENSITIVITY)    EKG EKG Interpretation  Date/Time:  Thursday January 06 2021 17:46:58 EDT Ventricular Rate:  75 PR Interval:  189 QRS Duration: 83 QT Interval:  395 QTC Calculation: 442 R Axis:   47 Text Interpretation: Sinus rhythm Abnormal R-wave progression, early transition Borderline repolarization abnormality No significant change since last tracing Confirmed by Deno Etienne [id number]) on 01/06/2021 6:55:58 PM  Radiology DG Chest Port 1 View  Result Date: 01/06/2021 CLINICAL DATA:  Fatigue and weakness for 1 month. EXAM: PORTABLE CHEST 1 VIEW COMPARISON:  April 15, 2018 FINDINGS: The heart size and mediastinal contours are within normal limits. Both lungs are clear. The visualized skeletal structures are stable. IMPRESSION: No acute cardiopulmonary disease Electronically Signed   By: Abelardo Diesel M.D.   On: 01/06/2021 19:06    Procedures Procedures   Medications Ordered in ED Medications  predniSONE (DELTASONE) tablet 15 mg (has no administration in time range)  sodium chloride 0.9 % bolus 1,000 mL (1,000 mLs Intravenous New Bag/Given 01/06/21  1841)    ED Course  I have reviewed the triage vital signs and the nursing notes.  Pertinent labs & imaging results that were available during my care of the patient were reviewed by me and considered in my medical decision making (see chart for details).    MDM Rules/Calculators/A&P                          87 yo M with a chief complaint of generalized fatigue.  Has trouble describing any other symptoms.  Is having vertigo that feels like it has off and on since the 60s.  Will obtain a laboratory evaluation bolus of IV fluids COVID test.  UA chest x-ray.  Reassess.  The work-up here is largely unremarkable.  The patient is notably hypertensive though tells me he is always hypertensive at his doctor's office.  His UA is negative for infection.  Chest x-ray viewed by me without focal infiltrate or pneumothorax.  Troponin negative.  COVID and flu negative.  EKG without concerning changes.  His ESR is elevated.  I am considering polymyalgia rheumatica as a possible cause of his symptoms though he does not have all the typical features.  We will do a trial of a low-dose steroid.  Have him follow-up with his family doctor in the office.  8:13 PM:  I have discussed the diagnosis/risks/treatment options with the patient and family and believe the pt to be eligible for discharge home to follow-up with PCP. We also discussed returning to the ED immediately if new or worsening sx occur. We discussed the sx which are most concerning (e.g., sudden worsening pain, fever, inability to tolerate by mouth) that necessitate immediate return. Medications administered to the patient during their visit and any new prescriptions provided to the patient are listed below.  Medications given during this visit Medications  predniSONE (DELTASONE) tablet 15 mg (has no administration in time range)  sodium chloride 0.9 % bolus 1,000 mL (1,000 mLs Intravenous New Bag/Given 01/06/21 1841)     The patient appears reasonably  screen and/or stabilized for discharge and I doubt any other medical condition or other [facility] requiring further screening, evaluation, or treatment in the ED at this time prior to discharge.   Final Clinical Impression(s) / ED Diagnoses Final diagnoses:  Fatigue, unspecified type  Primary hypertension    Rx / DC Orders ED Discharge Orders          Ordered    predniSONE (DELTASONE) 5 MG tablet  Daily with breakfast        01/06/21 2011             Deno Etienne, DO 01/06/21 2014

## 2021-01-06 NOTE — Discharge Instructions (Addendum)
I am not sure of the exact cause of your symptoms.  1 possible cause would be something called polymyalgia rheumatica.  This can make you have difficulty with the big muscles in your shoulders and your hips.  Typically are treated with a course of steroids.  I have started you on this medication.  Please call your family doctor and try and set up an appointment within a week to have them evaluate you to see if this is having any improvement.  They also likely need to see you to evaluate your list of medications and see what another possible cause of your fatigue would be.  Please return to the emergency department for worsening weakness if you are unsafe at home.

## 2021-01-07 LAB — C-REACTIVE PROTEIN: CRP: 0.6 mg/dL (ref <1.0)

## 2021-01-18 NOTE — Telephone Encounter (Signed)
Late entry: 01/06/21 at 3:15pm- left message for pt to call back.

## 2021-01-19 ENCOUNTER — Telehealth: Payer: Self-pay | Admitting: Neurology

## 2021-01-19 NOTE — Telephone Encounter (Signed)
LVM: [facility] called, Pt need an appt with Dr. Jannifer Franklin for increased immobility. Would like a call from the nurse.

## 2021-02-01 ENCOUNTER — Encounter: Payer: Self-pay | Admitting: Neurology

## 2021-02-01 ENCOUNTER — Ambulatory Visit: Payer: Medicare Other | Admitting: Neurology

## 2021-02-01 VITALS — BP 168/73 | HR 78 | Ht 64.0 in | Wt 169.0 lb

## 2021-02-01 DIAGNOSIS — R42 Dizziness and giddiness: Secondary | ICD-10-CM | POA: Diagnosis not present

## 2021-02-01 DIAGNOSIS — R269 Unspecified abnormalities of gait and mobility: Secondary | ICD-10-CM | POA: Diagnosis not present

## 2021-02-01 DIAGNOSIS — G459 Transient cerebral ischemic attack, unspecified: Secondary | ICD-10-CM | POA: Diagnosis not present

## 2021-02-01 NOTE — Progress Notes (Signed)
Reason for visit: Vertigo, possible TIA  Nathan Santiago is an 87 y.o. male  History of present illness:  Nathan Santiago is an 87 year old left-handed white male with a history of episodic positional vertigo since the 1960s.  He has had episodes of vertigo off and on since that time, he recently began having another spell of vertigo 3 months ago.  He has entered into vestibular rehab therapy which has helped.  Within the last several days, he feels quite well.  The patient went to the emergency room on 06 January 2021 with a different type of episode.  He was in the shower, and noted that he was no longer able to move 1 leg or the other to get out of the shower and he required assistance.  He indicated that the episode lasted only a few minutes and then resolved.  He does not recall any numbness or any weakness of the arms.  He did not fall.  He does have some chronic neck pain.  He denies issues controlling the bowels or the bladder.  He has known cerebrovascular disease, he has 83 to 70% stenosis of the left internal carotid artery.  He is on Plavix therapy.  He comes here for further evaluation.  Past Medical History:  Diagnosis Date   AAA (abdominal aortic aneurysm) ([place])    asymptomatic   AAA (abdominal aortic aneurysm) ([place]) 2010   peripheral  angiogram-- bilateral SFA DISEASE  and 60 to 70% infrarenaal abd. aortic stenosis with 15 -mm gradient   Adenomatous colon polyp 11/2003   CAD (coronary artery disease)    Gait abnormality 02/13/2017   Gastroparesis    pt denies   GERD (gastroesophageal reflux disease)    w/ LPR   History of kidney stones    x2   Hyperlipidemia    Hypertension    Peripheral arterial disease ([place])    RCEA  by Dr Lemar Livings   PUD (peptic ulcer disease)    pt unaware   Vertigo     Past Surgical History:  Procedure Laterality Date   ABDOMINAL AORTOGRAM W/LOWER EXTREMITY Bilateral 08/05/2018   Procedure: ABDOMINAL AORTOGRAM W/LOWER EXTREMITY;  Surgeon:  Lorretta Harp, MD;  Location: [facility];  Service: Cardiovascular;  Laterality: Bilateral;   AORTOGRAM  04/24/2016    Abdominal aortogram, bilateral iliac angiogram, bifemoral runoff   CARDIAC CATHETERIZATION  05/12/2005   RCA   carotid doppler  01/24/2013   RICA endarterectomy,left CCA 0-49%; left bulb and prox ICA 50-69%; bilaateral subclavian < 50%   CAROTID ENDARTERECTOMY  11/01/2010   CATARACT EXTRACTION     COLONOSCOPY     CORONARY ANGIOPLASTY  05/18/2005   2 STENTS distal RCA AND PROXIMAL-MID RCA   5 total stents per pt.   CYSTOSCOPY/URETEROSCOPY/HOLMIUM LASER/STENT PLACEMENT Left 01/11/2018   Procedure: LEFT URETEROSCOPY/HOLMIUM LASER/STENT PLACEMENT;  Surgeon: Ardis Hughs, MD;  Location: [facility];  Service: Urology;  Laterality: Left;   DOPPLER ECHOCARDIOGRAPHY  02/07/2012   EF 55%,SHOWED NO ISCHEMIA    HERNIA REPAIR     umbilical   lower arterial  doppler  02/04/2013   aotra 1.5 x 1.5 cm; distal abd aorta 70-99%,proximal common iliac arteries -very stenotic with increased velocities>50%,may be falsely elevated as a result of residual plaque from the distal aorta stenosis   lower extremity doppler  June 18 ,2013   ABI'S ABNORMAL, RABI was 0.88 and LABI 0.75 ,with 3-vessel  run off   NM MYOVIEW LTD  MAY 23,2011   showed no significant ischemia;   NM MYOVIEW LTD  04/22/2008   ef 77%,exercise capcity 6 METS ,exaggerated blood pressure response to exercise   PERIPHERAL VASCULAR CATHETERIZATION N/A 04/24/2016   Procedure: Lower Extremity Angiography;  Surgeon: Lorretta Harp, MD;  Location: [facility];  Service: Cardiovascular;  Laterality: N/A;   PERIPHERAL VASCULAR CATHETERIZATION  04/24/2016   Procedure: Peripheral Vascular Intervention;  Surgeon: Lorretta Harp, MD;  Location: [facility];  Service: Cardiovascular;;  Aorta   retrograde central aortic catheterization  05/19/2005   cutting balloon atherectomy, c-circ stenosis with DES STENTING CYPHER    TONSILLECTOMY     TOTAL KNEE ARTHROPLASTY Left 01/03/2019   Procedure: TOTAL KNEE ARTHROPLASTY;  Surgeon: Netta Cedars, MD;  Location: [facility];  Service: Orthopedics;  Laterality: Left;  with IS block   TRANSURETHRAL RESECTION OF PROSTATE N/A 09/08/2016   Procedure: TRANSURETHRAL RESECTION OF THE PROSTATE (TURP);  Surgeon: Ardis Hughs, MD;  Location: [facility];  Service: Urology;  Laterality: N/A;   TRANSURETHRAL RESECTION OF PROSTATE     10-10-17  Dr. Louis Meckel   TRANSURETHRAL RESECTION OF PROSTATE N/A 10/10/2017   Procedure: TRANSURETHRAL RESECTION OF THE PROSTATE (TURP);  Surgeon: Ardis Hughs, MD;  Location: [facility];  Service: Urology;  Laterality: N/A;   VASECTOMY      Family History  Problem Relation Age of Onset   Ovarian cancer Sister    Heart disease Father    Brain cancer Brother        Tumor    Social history:  reports that he quit smoking about 52 years ago. His smoking use included cigarettes. He has a 40.00 pack-year smoking history. He has never used smokeless tobacco. He reports current alcohol use of about 4.0 standard drinks of alcohol per week. He reports that he does not use drugs.    Allergies  Allergen Reactions   Lisinopril Cough   Niaspan [Niacin Er] Rash    Medications:  Prior to Admission medications   Medication Sig Start Date End Date Taking? Authorizing Provider  acetaminophen (TYLENOL) 500 MG tablet Take 1 tablet (500 mg total) by mouth 2 (two) times daily as needed. 01/13/19  Yes Wert, Margreta Journey, NP  amLODipine (NORVASC) 5 MG tablet Take 5 mg by mouth daily.   Yes [provider]  Apoaequorin (PREVAGEN PO) Take 1 tablet by mouth daily.   Yes [provider]  aspirin EC 81 MG tablet Take 1 tablet (81 mg total) by mouth 2 (two) times a day. 01/03/19  Yes Netta Cedars, MD  atenolol (TENORMIN) 50 MG tablet Take 50 mg by mouth daily.   Yes [provider]  atorvastatin (LIPITOR) 40 MG tablet Take 40 mg by mouth daily at 6  PM.  11/14/16  Yes [provider]  benzonatate (TESSALON) 100 MG capsule Take 100 mg by mouth at bedtime as needed for cough.  08/02/14  Yes [provider]  Biotin 10 MG CAPS Take 10 mg by mouth daily.   Yes [provider]  bismuth subsalicylate (PEPTO BISMOL) 262 MG/15ML suspension Take 30 mLs by mouth every 6 (six) hours as needed for indigestion.   Yes [provider]  cholecalciferol (VITAMIN D3) 25 MCG (1000 UT) tablet Take 1,000 Units by mouth daily.   Yes [provider]  cilostazol (PLETAL) 100 MG tablet  02/23/19  Yes [provider]  clopidogrel (PLAVIX) 75 MG tablet Take 75 mg by mouth every evening.  Yes [provider]  COVID-19 mRNA vaccine, Moderna, 100 MCG/0.5ML injection Inject into the muscle. 11/19/20  Yes Carlyle Basques, MD  diazepam (VALIUM) 5 MG tablet TAKE 1/2 TO 1 TABLET BY  MOUTH EVERY 8 HOURS AS  NEEDED (CAUTION: CAUSES  SEDATION) 01/21/18  Yes [provider]  diphenhydrAMINE (SOMINEX) 25 MG tablet diphenhydramine 25 mg tablet   25 mg by oral route.   Yes [provider]  finasteride (PROSCAR) 5 MG tablet  02/23/19  Yes [provider]  hydrochlorothiazide 25 MG tablet Take 25 mg by mouth daily.   Yes [provider]  HYDROcodone bit-homatropine (HYCODAN) 5-1.5 MG/5ML syrup Take 5 mLs by mouth every 6 (six) hours. 11/29/20  Yes [provider]  losartan (COZAAR) 50 MG tablet Take 50 mg by mouth every evening.    Yes [provider]  Magnesium 250 MG TABS Take 250 mg by mouth at bedtime.   Yes [provider]  meclizine (ANTIVERT) 12.5 MG tablet  04/22/19  Yes [provider]  Misc Natural Products (GLUCOSAMINE CHONDROITIN TRIPLE) TABS Take 2 tablets by mouth daily.    Yes [provider]  Multiple Vitamin (MULTIVITAMIN WITH MINERALS) TABS tablet Take 1 tablet by mouth daily.   Yes [provider]  Omega-3 Fatty Acids (FISH  OIL BURP-LESS) 1000 MG CAPS TAKE 1 CAPSULE BY MOUTH ONCE EVERY DAY 05/25/03  Yes [provider]  pantoprazole (PROTONIX) 40 MG tablet TAKE 1 TABLET BY MOUTH TWICE A DAY 10/21/18  Yes Byrum, Rose Fillers, MD  potassium chloride SA (K-DUR,KLOR-CON) 20 MEQ tablet Take 20 mEq by mouth every evening.  07/16/13  Yes [provider]  tamsulosin (FLOMAX) 0.4 MG CAPS capsule Take 2 capsules by mouth at bedtime. 01/19/15  Yes [provider]  traMADol Veatrice Bourbon) 50 MG tablet  01/16/19  Yes [provider]  triamcinolone cream (KENALOG) 0.1 % Apply 1 application topically 2 (two) times daily. To back rash   Yes [provider]  vitamin C (ASCORBIC ACID) 500 MG tablet Take 500 mg by mouth daily.   Yes [provider]    ROS:  Out of a complete 14 system review of symptoms, the patient complains only of the following symptoms, and all other reviewed systems are negative.  Vertigo Walking difficulty Transient leg weakness  Blood pressure (!) 168/73, pulse 78, height 5\' 4"  (1.626 m), weight 169 lb (76.7 kg).  Physical Exam  General: The patient is alert and cooperative at the time of the examination.  Skin: No significant peripheral edema is noted.   Neurologic Exam  Mental status: The patient is alert and oriented x 3 at the time of the examination. The patient has apparent normal recent and remote memory, with an apparently normal attention span and concentration ability.   Cranial nerves: Facial symmetry is present. Speech is normal, no aphasia or dysarthria is noted. Extraocular movements are full. Visual fields are full.  Motor: The patient has good strength in all 4 extremities.  Sensory examination: Soft touch sensation is symmetric on the face, arms, and legs.  Coordination: The patient has good finger-nose-finger and heel-to-shin bilaterally.  Gait and station: The patient has a slightly wide-based gait, he takes short shuffling steps.   Tandem gait was not attempted.  Romberg is negative.  Reflexes: Deep tendon reflexes are symmetric.   Assessment/Plan:  1.  Chronic vertigo  2.  Gait disorder  3.  Transient leg weakness, possible TIA  4.  History of  left carotid stenosis  The patient had an event where he was unable to move 1 leg or the other, he is not sure which.  He was unable to walk and unable to get out of the shower, he required assistance.  The episode was relatively transient but may have represented a TIA event.  He will be set up for MRI of the brain, he will remain on Plavix.  His vertigo recently has been better controlled, he takes vestibular rehabilitation off and on.  Jill Alexanders MD 02/01/2021 4:49 PM  [facility] [address] [place], [place] [address]  Phone [phone] Fax [phone]

## 2021-02-02 ENCOUNTER — Telehealth: Payer: Self-pay | Admitting: Neurology

## 2021-02-02 NOTE — Telephone Encounter (Signed)
MRI brain wo contrast [employer] auth: NPR  Sent to GI for scheduling.

## 2021-02-07 ENCOUNTER — Other Ambulatory Visit: Payer: Self-pay

## 2021-02-07 ENCOUNTER — Ambulatory Visit
Admission: RE | Admit: 2021-02-07 | Discharge: 2021-02-07 | Disposition: A | Discharge status: Home / Self Care | Payer: Medicare Other | Source: Ambulatory Visit | Attending: Neurology | Admitting: Neurology

## 2021-02-07 DIAGNOSIS — G459 Transient cerebral ischemic attack, unspecified: Secondary | ICD-10-CM

## 2021-02-08 ENCOUNTER — Telehealth: Payer: Self-pay | Admitting: Neurology

## 2021-02-08 NOTE — Telephone Encounter (Signed)
MRI of the brain shows extensive white matter changes, there is significant cortical atrophy, no clear evidence of recent stroke event.  Carotid Doppler study done within the last 6 months shows 40 to 59% stenosis of the left internal carotid artery, no stenosis on the right.  The patient will remain on Plavix.  I discussed blood pressure control, his blood pressure was slightly elevated when seen in the office recently, he is to get a blood pressure cuff and monitor his blood pressures regularly, if they are consistently over 254 systolic he will contact his primary care physician to more aggressively treat blood pressure.   MRI brain 02/07/21:  IMPRESSION:  This MRI of the brain without contrast shows the following: 1.   No acute findings. 2.   Extensive T2/FLAIR hyperintense foci in the periventricular deep white matter consistent with chronic microvascular ischemic change, mildly progressed compared to the 2018 MRI.  None of the foci appear to be acute. 3.   Mild to moderate generalized cortical atrophy, slightly progressed compared to the 2018 MRI.

## 2021-02-10 ENCOUNTER — Other Ambulatory Visit: Payer: Medicare Other

## 2021-03-01 ENCOUNTER — Ambulatory Visit: Payer: Self-pay | Admitting: Neurology

## 2021-04-21 ENCOUNTER — Ambulatory Visit: Payer: Medicare Other | Admitting: Podiatry

## 2021-04-21 ENCOUNTER — Other Ambulatory Visit: Payer: Self-pay

## 2021-04-21 ENCOUNTER — Encounter: Payer: Self-pay | Admitting: Podiatry

## 2021-04-21 DIAGNOSIS — M722 Plantar fascial fibromatosis: Secondary | ICD-10-CM | POA: Diagnosis not present

## 2021-04-21 MED ORDER — TRIAMCINOLONE ACETONIDE 10 MG/ML IJ SUSP
20.0000 mg | Freq: Once | INTRAMUSCULAR | Status: AC
Start: 2021-04-21 — End: 2021-04-21
  Administered 2021-04-21: 20 mg

## 2021-04-22 NOTE — Progress Notes (Signed)
Subjective:   Patient ID: Nathan Santiago, male   DOB: 87 y.o.   MRN: [id number]   HPI Patient states he was doing well but is started to develop discomfort in the bottom of both heels again and is due to be more active and states that it makes it hard   ROS      Objective:  Physical Exam  Neurovascular status intact with exquisite discomfort plantar aspect heel right over left at the insertional point to the tendon calcaneus     Assessment:  Acute plantar fasciitis bilateral heel     Plan:  Sterile prep injected the fascia 3 mg Kenalog 5 mg Xylocaine bilateral applied sterile dressings reappoint as needed routine care

## 2021-06-03 ENCOUNTER — Encounter: Payer: Self-pay | Admitting: Cardiovascular Disease

## 2021-06-03 ENCOUNTER — Other Ambulatory Visit: Payer: Self-pay

## 2021-06-03 ENCOUNTER — Ambulatory Visit: Payer: Medicare Other | Admitting: Cardiovascular Disease

## 2021-06-03 VITALS — BP 140/80 | HR 67 | Ht 64.0 in | Wt 169.8 lb

## 2021-06-03 DIAGNOSIS — I2510 Atherosclerotic heart disease of native coronary artery without angina pectoris: Secondary | ICD-10-CM

## 2021-06-03 DIAGNOSIS — E7800 Pure hypercholesterolemia, unspecified: Secondary | ICD-10-CM

## 2021-06-03 DIAGNOSIS — I10 Essential (primary) hypertension: Secondary | ICD-10-CM | POA: Diagnosis not present

## 2021-06-03 DIAGNOSIS — I6523 Occlusion and stenosis of bilateral carotid arteries: Secondary | ICD-10-CM

## 2021-06-03 DIAGNOSIS — I739 Peripheral vascular disease, unspecified: Secondary | ICD-10-CM | POA: Diagnosis not present

## 2021-06-03 MED ORDER — CILOSTAZOL 50 MG PO TABS: 50.0000 mg | ORAL_TABLET | Freq: Two times a day (BID) | ORAL | 2 refills | Status: DC

## 2021-06-03 NOTE — Progress Notes (Signed)
06/03/2021 Nathan Santiago [facility]   09-15-1930  [id number]  Primary Physician [name], MD Primary Cardiologist: [name] Nathan Santiago, [place], [place]  HPI:  Nathan Santiago is a 87 y.o.  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 He was formally a patient of [name].  I last saw him in the office 11/05/2020.  He is accompanied by his wife Nathan Santiago today.[name] His cardiovascular status profile is remarkable for remote tobacco abuse, 2 hypertension and hyperlipidemia. He is not diabetic. He does [place] 5 days a week 25 minutes a day. He hasn't known cardiovascular disease status post stenting of his proximal mid and distal RCA using overlapping stents as well as the proximal AV groove circumflex with a known occluded OM branch which is old. He has normal LV function by 2-D echo performed 02/07/12 a nonischemic Myoview 2010. He denies chest pain, shortness of breath or claudication. He had carotid Dopplers performed 01/09/14 that showed a widely patent right carotid endarterectomy site with moderate left ICA stenosis remained stable. Since I saw him in January of this year he's noticed increasing bilateral lower extremity claudication which is lifestyle limiting. Dopplers have shown a decline in his ABIs bilaterally. He does have high frequency signal in his right greater than left iliac systems. I performed peripheral angiography on him 04/24/16 revealing a 75% distal abdominal aortic stenosis with a 60 mm pullback gradient. He also had a 95% calcified distal left common femoral artery stenosis and an 80% segmental mid right SFA stenosis with 2 vessel runoff bilaterally. I stented his distal abdominal aorta with a 10 x 29 mm long balloon expandable stent. His ABIs and increased from 0.7 bilaterally up to 1 on the right and 0.8 on the left. His claudication completely resolved. He was able to go on a trip to  the [facility] and ambulated without limitation. Since I saw him a year ago he is complained of bilateral thigh claudication which is lifestyle limiting.  His Dopplers do appear to show moderate iliac disease.    I performed outpatient peripheral angiography on 08/05/2018 revealing a patent distal abdominal aortic stent without a gradient and otherwise unchanged anatomy with a 95% calcified left common femoral artery stenosis and bilateral moderate SFA disease.   Since I saw him 6 months ago he has started to complain of lifestyle limiting bilateral thigh claudication.  He also is suffering from chronic vertigo.  He did have lower extremity arterial Doppler studies performed 11/17/2020 revealing a patent aortic stent.  He denies chest pain or shortness of breath.  Current Meds  Medication Sig   acetaminophen (TYLENOL) 500 MG tablet Take 1 tablet (500 mg total) by mouth 2 (two) times daily as needed.   amLODipine (NORVASC) 5 MG tablet Take 5 mg by mouth daily.   Apoaequorin (PREVAGEN PO) Take 1 tablet by mouth daily.   aspirin EC 81 MG tablet Take 1 tablet (81 mg total) by mouth 2 (two) times a day.   atenolol (TENORMIN) 50 MG tablet Take 50 mg by mouth daily.   atorvastatin (LIPITOR) 40 MG tablet Take 40 mg by mouth daily at 6 PM.    benzonatate (TESSALON) 100 MG capsule Take 100 mg by mouth at bedtime as needed for cough.    Biotin 10 MG CAPS Take 10 mg by mouth daily.   bismuth subsalicylate (PEPTO BISMOL) 262 MG/15ML  suspension Take 30 mLs by mouth every 6 (six) hours as needed for indigestion.   cholecalciferol (VITAMIN D3) 25 MCG (1000 UT) tablet Take 1,000 Units by mouth daily.   clopidogrel (PLAVIX) 75 MG tablet Take 75 mg by mouth every evening.    COVID-19 mRNA vaccine, Moderna, 100 MCG/0.5ML injection Inject into the muscle.   diazepam (VALIUM) 5 MG tablet TAKE 1/2 TO 1 TABLET BY  MOUTH EVERY 8 HOURS AS  NEEDED (CAUTION: CAUSES  SEDATION)   diphenhydrAMINE (SOMINEX) 25 MG tablet  diphenhydramine 25 mg tablet   25 mg by oral route.   finasteride (PROSCAR) 5 MG tablet    hydrochlorothiazide 25 MG tablet Take 25 mg by mouth daily.   HYDROcodone bit-homatropine (HYCODAN) 5-1.5 MG/5ML syrup Take 5 mLs by mouth every 6 (six) hours.   losartan (COZAAR) 50 MG tablet Take 50 mg by mouth every evening.    Magnesium 250 MG TABS Take 250 mg by mouth at bedtime.   meclizine (ANTIVERT) 12.5 MG tablet    Misc Natural Products (GLUCOSAMINE CHONDROITIN TRIPLE) TABS Take 2 tablets by mouth daily.    Multiple Vitamin (MULTIVITAMIN WITH MINERALS) TABS tablet Take 1 tablet by mouth daily.   Omega-3 Fatty Acids (FISH OIL BURP-LESS) 1000 MG CAPS TAKE 1 CAPSULE BY MOUTH ONCE EVERY DAY   pantoprazole (PROTONIX) 40 MG tablet TAKE 1 TABLET BY MOUTH TWICE A DAY   potassium chloride SA (K-DUR,KLOR-CON) 20 MEQ tablet Take 20 mEq by mouth every evening.    tamsulosin (FLOMAX) 0.4 MG CAPS capsule Take 2 capsules by mouth at bedtime.   traMADol (ULTRAM) 50 MG tablet    triamcinolone cream (KENALOG) 0.1 % Apply 1 application topically 2 (two) times daily. To back rash   vitamin C (ASCORBIC ACID) 500 MG tablet Take 500 mg by mouth daily.   [DISCONTINUED] cilostazol (PLETAL) 100 MG tablet      Allergies  Allergen Reactions   Lisinopril Cough   Niaspan [Niacin Er] Rash    Social History   Socioeconomic History   Marital status: Married    Spouse name: Nathan Santiago   Number of children: Not on file   Years of education: BA   Highest education level: Not on file  Occupational History   Occupation: Retired  Tobacco Use   Smoking status: Former    Packs/day: 2.00    Years: 20.00    Pack years: 40.00    Types: Cigarettes    Quit date: 07/31/1968    Years since quitting: 52.8   Smokeless tobacco: Never  Vaping Use   Vaping Use: Never used  Substance and Sexual Activity   Alcohol use: Yes    Alcohol/week: 4.0 standard drinks    Types: 4 Glasses of wine per week   Drug use: No   Sexual  activity: Not Currently  Other Topics Concern   Not on file  Social History Narrative   Lives with wife   Caffeine use: Decaf coffee, tea   Left handed   Social Determinants of Health   Financial Resource Strain: Not on file  Food Insecurity: Not on file  Transportation Needs: Not on file  Physical Activity: Not on file  Stress: Not on file  Social Connections: Not on file  Intimate Partner Violence: Not on file     Review of Systems: General: negative for chills, fever, night sweats or weight changes.  Cardiovascular: negative for chest pain, dyspnea on exertion, edema, orthopnea, palpitations, paroxysmal nocturnal dyspnea or shortness of breath Dermatological:  negative for rash Respiratory: negative for cough or wheezing Urologic: negative for hematuria Abdominal: negative for nausea, vomiting, diarrhea, bright red blood per rectum, melena, or hematemesis Neurologic: negative for visual changes, syncope, or dizziness All other systems reviewed and are otherwise negative except as noted above.    Blood pressure (!) 178/86, pulse 67, height 5\' 4"  (1.626 m), weight 169 lb 12.8 oz (77 kg), SpO2 97 %.  General appearance: alert and no distress Neck: no adenopathy, no JVD, supple, symmetrical, trachea midline, thyroid not enlarged, symmetric, no tenderness/mass/nodules, and left carotid bruit Lungs: clear to auscultation bilaterally Heart: regular rate and rhythm, S1, S2 normal, no murmur, click, rub or gallop Extremities: extremities normal, atraumatic, no cyanosis or edema Pulses: 2+ and symmetric Skin: Skin color, texture, turgor normal. No rashes or lesions Neurologic: Grossly normal  EKG sinus rhythm at 67 with nonspecific ST and T wave changes.  I personally reviewed this EKG.  ASSESSMENT AND PLAN:   HYPERCHOLESTEROLEMIA History of hyperlipidemia on statin therapy with lipid profile performed 10/20/2020 revealing total cholesterol 151, LDL of 84 and HDL 47.  Essential  hypertension History of essential hypertension a blood pressure measured today at 178/86.  He is on amlodipine, atenolol and losartan.  Blood pressure at the end of his visit was measured at 140/80.  Coronary atherosclerosis History of CAD status post stenting of his proximal mid and distal RCA using overlapping stents as well as proximal AV groove circumflex with a known occluded OM branch.  He denies chest pain or shortness of breath.  Peripheral arterial disease ([place]) History of peripheral arterial disease status post distal abdominal aortic stenting by myself using a 10 mm x 29 mm long balloon expandable stent 04/24/2016.  I restudied him because of recurrent claudication 08/05/2018 revealing a patent distal abdominal aortic stent without a gradient, 95% calcified left common femoral artery stenosis as well as moderate to severe distal right SFA disease.  He was complaining of foot pain back 6 months ago which resolved with Kenalog injections by his podiatrist, Dr. Paulla Dolly.  He also was on Pletal remotely.  He now complains of severe lifestyle limiting claudication principally in his thighs.  His last Doppler studies performed 11/17/2020 revealed widely patent aortic stent.  Bilateral carotid artery disease ([facility]) History of carotid artery disease with Dopplers performed 08/19/2020 revealing moderate left ICA stenosis.  This will be repeated this coming January.     [name] FACP,FACC,FAHA, [facility] 06/03/2021 11:43 AM

## 2021-06-03 NOTE — Assessment & Plan Note (Addendum)
History of essential hypertension a blood pressure measured today at 178/86.  He is on amlodipine, atenolol and losartan.  Blood pressure at the end of his visit was measured at 140/80.

## 2021-06-03 NOTE — Assessment & Plan Note (Signed)
History of hyperlipidemia on statin therapy with lipid profile performed 10/20/2020 revealing total cholesterol 151, LDL of 84 and HDL 47.

## 2021-06-03 NOTE — Assessment & Plan Note (Signed)
History of carotid artery disease with Dopplers performed 08/19/2020 revealing moderate left ICA stenosis.  This will be repeated this coming January.

## 2021-06-03 NOTE — Assessment & Plan Note (Signed)
History of peripheral arterial disease status post distal abdominal aortic stenting by myself using a 10 mm x 29 mm long balloon expandable stent 04/24/2016.  I restudied him because of recurrent claudication 08/05/2018 revealing a patent distal abdominal aortic stent without a gradient, 95% calcified left common femoral artery stenosis as well as moderate to severe distal right SFA disease.  He was complaining of foot pain back 6 months ago which resolved with Kenalog injections by his podiatrist, Dr. Paulla Dolly.  He also was on Pletal remotely.  He now complains of severe lifestyle limiting claudication principally in his thighs.  His last Doppler studies performed 11/17/2020 revealed widely patent aortic stent.

## 2021-06-03 NOTE — Patient Instructions (Signed)
Medication Instructions:   -Start taking cilostazol (pletal) 50mg  twice daily.  *If you need a refill on your cardiac medications before your next appointment, please call your pharmacy*   Testing/Procedures: Your physician has requested that you have a carotid duplex. This test is an ultrasound of the carotid arteries in your neck. It looks at blood flow through these arteries that supply the brain with blood. Allow one hour for this exam. There are no restrictions or special instructions.  Dr. Gwenlyn Found has recommended that you have an Ultrasound of your AORTA/IVC/ILIACS.   To prepare for this test:  No food after 11PM the night before. Water is OK. (Don't drink liquids if you have been instructed not to for ANOTHER test).  Avoid foods that produce bowel gas, for 24 hours prior to exam (see below). No breakfast, no chewing gum, no smoking or carbonated beverages. Patient may take morning medications with water. Come in for test at least 15 minutes early to register.  Your physician has requested that you have an ankle brachial index (ABI). During this test an ultrasound and blood pressure cuff are used to evaluate the arteries that supply the arms and legs with blood. Allow thirty minutes for this exam. There are no restrictions or special instructions.  To be done in Jan 2023. These procedures are done at [place].   Follow-Up: At [facility], you and your health needs are our priority.  As part of our continuing mission to provide you with exceptional heart care, we have created designated Provider Care Teams.  These Care Teams include your primary Cardiologist (physician) and Advanced Practice Providers (APPs -  Physician Assistants and Nurse Practitioners) who all work together to provide you with the care you need, when you need it.  We recommend signing up for the patient portal called "MyChart".  Sign up information is provided on this After Visit Summary.  MyChart is  used to connect with patients for Virtual Visits (Telemedicine).  Patients are able to view lab/test results, encounter notes, upcoming appointments, etc.  Non-urgent messages can be sent to your provider as well.   To learn more about what you can do with MyChart, go to [email or web address].    Your next appointment:   3 month(s)  The format for your next appointment:   In Person  Provider:   [name], MD {

## 2021-06-03 NOTE — Assessment & Plan Note (Signed)
History of CAD status post stenting of his proximal mid and distal RCA using overlapping stents as well as proximal AV groove circumflex with a known occluded OM branch.  He denies chest pain or shortness of breath.

## 2021-06-07 ENCOUNTER — Encounter (HOSPITAL_COMMUNITY): Payer: Medicare Other

## 2021-06-21 ENCOUNTER — Encounter (HOSPITAL_COMMUNITY): Payer: Medicare Other

## 2021-07-19 ENCOUNTER — Other Ambulatory Visit: Payer: Self-pay

## 2021-07-19 ENCOUNTER — Encounter: Payer: Self-pay | Admitting: Podiatry

## 2021-07-19 ENCOUNTER — Ambulatory Visit: Payer: Medicare Other | Admitting: Podiatry

## 2021-07-19 DIAGNOSIS — M722 Plantar fascial fibromatosis: Secondary | ICD-10-CM

## 2021-07-19 NOTE — Progress Notes (Signed)
°  Subjective:  Patient ID: Nathan Santiago, male    DOB: [birth year],  MRN: [id number]  Chief Complaint  Patient presents with   Foot Pain    BIL pain in ankles -requesting injection    87 y.o. male presents with the above complaint. History confirmed with patient.  Returns for follow-up of plantar heel pain bilateral he has had improvement with injections from Dr. Paulla Dolly and Dr. Milinda Pointer from this before.  He is requesting another one today.  Objective:  Physical Exam: warm, good capillary refill, no trophic changes or ulcerative lesions, normal DP and PT pulses, normal sensory exam, and sharp pain on plantar fascia at the insertion of medial calcaneal tubercle bilateral. Assessment:   1. Plantar fasciitis of right foot   2. Plantar fasciitis of left foot      Plan:  Patient was evaluated and treated and all questions answered.  Discussed the etiology and treatment options for plantar fasciitis including stretching, formal physical therapy, supportive shoegears such as a running shoe or sneaker, pre fabricated orthoses, injection therapy, and oral medications. We also discussed the role of surgical treatment of this for patients who do not improve after exhausting non-surgical treatment options.   -Educated patient on stretching and icing of the affected limb -Injection delivered to the plantar fascia of both feet.  After sterile prep with povidone-iodine solution and alcohol, the bilateral heel was injected with 0.5cc 2% xylocaine plain, 0.5cc 0.5% marcaine plain, 5mg  triamcinolone acetonide, and 2mg  dexamethasone was injected along the medial plantar fascia at the insertion on the plantar calcaneus. The patient tolerated the procedure well without complication.  Return if symptoms worsen or fail to improve, for heel pain / injections .

## 2021-08-09 ENCOUNTER — Encounter (HOSPITAL_COMMUNITY): Payer: Medicare Other

## 2021-08-19 ENCOUNTER — Other Ambulatory Visit: Payer: Self-pay

## 2021-08-19 ENCOUNTER — Ambulatory Visit (HOSPITAL_COMMUNITY)
Admission: RE | Admit: 2021-08-19 | Discharge: 2021-08-19 | Disposition: A | Discharge status: Home / Self Care | Payer: Medicare Other | Source: Ambulatory Visit | Attending: Cardiology | Admitting: Cardiology

## 2021-08-19 DIAGNOSIS — I6523 Occlusion and stenosis of bilateral carotid arteries: Secondary | ICD-10-CM | POA: Insufficient documentation

## 2021-09-06 ENCOUNTER — Encounter: Payer: Self-pay | Admitting: Cardiovascular Disease

## 2021-09-06 ENCOUNTER — Ambulatory Visit: Payer: Medicare Other | Admitting: Cardiovascular Disease

## 2021-09-06 ENCOUNTER — Other Ambulatory Visit: Payer: Self-pay

## 2021-09-06 DIAGNOSIS — I6523 Occlusion and stenosis of bilateral carotid arteries: Secondary | ICD-10-CM

## 2021-09-06 DIAGNOSIS — I739 Peripheral vascular disease, unspecified: Secondary | ICD-10-CM

## 2021-09-06 NOTE — Patient Instructions (Signed)
Medication Instructions:  Your physician recommends that you continue on your current medications as directed. Please refer to the Current Medication list given to you today.  *If you need a refill on your cardiac medications before your next appointment, please call your pharmacy*   Follow-Up: At [facility], you and your health needs are our priority.  As part of our continuing mission to provide you with exceptional heart care, we have created designated Provider Care Teams.  These Care Teams include your primary Cardiologist (physician) and Advanced Practice Providers (APPs -  Physician Assistants and Nurse Practitioners) who all work together to provide you with the care you need, when you need it.  We recommend signing up for the patient portal called "MyChart".  Sign up information is provided on this After Visit Summary.  MyChart is used to connect with patients for Virtual Visits (Telemedicine).  Patients are able to view lab/test results, encounter notes, upcoming appointments, etc.  Non-urgent messages can be sent to your provider as well.   To learn more about what you can do with MyChart, go to [email or web address].    Your next appointment:   3 month(s)  The format for your next appointment:   In Person  Provider:   Jonathan Berry, MD 

## 2021-09-06 NOTE — Assessment & Plan Note (Signed)
History of carotid artery disease status post remote carotid endarterectomy on the left/17/12 by Dr. Scot Dock left common femoral endarterectomy.  His most recent carotid Doppler studies performed 08/19/2021 revealed moderate left ICA stenosis.  This will be repeated on an annual basis.

## 2021-09-06 NOTE — H&P (View-Only) (Signed)
09/06/2021 [name]   [birth year]  [id number]  Primary Physician [name], MD Primary Cardiologist: [name] [name], [place], [place]  HPI:  Nathan Santiago is a 88 y.o.  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 He was formally a patient of [name].  I last saw him in the office 06/03/2021.  He is accompanied by his wife [name] today.[name] His cardiovascular status profile is remarkable for remote tobacco abuse, 2 hypertension and hyperlipidemia. He is not diabetic. [place] 5 days a week 25 minutes a day. He hasn't known cardiovascular disease status post stenting of his proximal mid and distal RCA using overlapping stents as well as the proximal AV groove circumflex with a known occluded OM branch which is old. He has normal LV function by 2-D echo performed 02/07/12 a nonischemic Myoview 2010. He denies chest pain, shortness of breath or claudication. He had carotid Dopplers performed 01/09/14 that showed a widely patent right carotid endarterectomy site with moderate left ICA stenosis remained stable. Since I saw him in January of this year he's noticed increasing bilateral lower extremity claudication which is lifestyle limiting. Dopplers have shown a decline in his ABIs bilaterally. He does have high frequency signal in his right greater than left iliac systems. I performed peripheral angiography on him 04/24/16 revealing a 75% distal abdominal aortic stenosis with a 60 mm pullback gradient. He also had a 95% calcified distal left common femoral artery stenosis and an 80% segmental mid right SFA stenosis with 2 vessel runoff bilaterally. I stented his distal abdominal aorta with a 10 x 29 mm long balloon expandable stent. His ABIs and increased from 0.7 bilaterally up to 1 on the right and 0.8 on the left. His claudication completely resolved. He was able to go on a trip to  the [facility] and ambulated without limitation. Since I saw him a year ago he is complained of bilateral thigh claudication which is lifestyle limiting.  His Dopplers do appear to show moderate iliac disease.    I performed outpatient peripheral angiography on 08/05/2018 revealing a patent distal abdominal aortic stent without a gradient and otherwise unchanged anatomy with a 95% calcified left common femoral artery stenosis and bilateral moderate SFA disease.   Since I saw him 3 months ago he continues to complain of bilateral lifestyle limiting claudication.  He did have Doppler studies performed 11/17/2020 revealing what appears to be a patent distal aortic stent and iliac arteries with a high-frequency signal in the distal left common femoral and distal right SFA.  I am going to refer him to Dr. Scot Dock  who performed his carotid endarterectomy 11/15/2010.   Current Meds  Medication Sig   acetaminophen (TYLENOL) 500 MG tablet Take 1 tablet (500 mg total) by mouth 2 (two) times daily as needed.   amLODipine (NORVASC) 5 MG tablet Take 5 mg by mouth daily.   Apoaequorin (PREVAGEN PO) Take 1 tablet by mouth daily.   aspirin EC 81 MG tablet Take 1 tablet (81 mg total) by mouth 2 (two) times a day.   atenolol (TENORMIN) 50 MG tablet Take 50 mg by mouth daily.   atorvastatin (LIPITOR) 40 MG tablet Take 40 mg by mouth daily at 6 PM.    benzonatate (TESSALON) 100 MG capsule Take 100 mg by mouth at bedtime as needed for cough.  Biotin 10 MG CAPS Take 10 mg by mouth daily.   bismuth subsalicylate (PEPTO BISMOL) 262 MG/15ML suspension Take 30 mLs by mouth every 6 (six) hours as needed for indigestion.   cholecalciferol (VITAMIN D3) 25 MCG (1000 UT) tablet Take 1,000 Units by mouth daily.   cilostazol (PLETAL) 50 MG tablet Take 1 tablet (50 mg total) by mouth 2 (two) times daily.   clopidogrel (PLAVIX) 75 MG tablet Take 75 mg by mouth every evening.    COVID-19 mRNA vaccine, Moderna, 100 MCG/0.5ML  injection Inject into the muscle.   diazepam (VALIUM) 5 MG tablet TAKE 1/2 TO 1 TABLET BY  MOUTH EVERY 8 HOURS AS  NEEDED (CAUTION: CAUSES  SEDATION)   diphenhydrAMINE (SOMINEX) 25 MG tablet diphenhydramine 25 mg tablet   25 mg by oral route.   finasteride (PROSCAR) 5 MG tablet    hydrochlorothiazide 25 MG tablet Take 25 mg by mouth daily.   HYDROcodone bit-homatropine (HYCODAN) 5-1.5 MG/5ML syrup Take 5 mLs by mouth every 6 (six) hours.   losartan (COZAAR) 50 MG tablet Take 50 mg by mouth every evening.    Magnesium 250 MG TABS Take 250 mg by mouth at bedtime.   meclizine (ANTIVERT) 12.5 MG tablet    Misc Natural Products (GLUCOSAMINE CHONDROITIN TRIPLE) TABS Take 2 tablets by mouth daily.    Multiple Vitamin (MULTIVITAMIN WITH MINERALS) TABS tablet Take 1 tablet by mouth daily.   Omega-3 Fatty Acids (FISH OIL BURP-LESS) 1000 MG CAPS TAKE 1 CAPSULE BY MOUTH ONCE EVERY DAY   pantoprazole (PROTONIX) 40 MG tablet TAKE 1 TABLET BY MOUTH TWICE A DAY   potassium chloride SA (K-DUR,KLOR-CON) 20 MEQ tablet Take 20 mEq by mouth every evening.    tamsulosin (FLOMAX) 0.4 MG CAPS capsule Take 2 capsules by mouth at bedtime.   traMADol (ULTRAM) 50 MG tablet    triamcinolone cream (KENALOG) 0.1 % Apply 1 application topically 2 (two) times daily. To back rash   vitamin C (ASCORBIC ACID) 500 MG tablet Take 500 mg by mouth daily.     Allergies  Allergen Reactions   Lisinopril Cough   Niaspan [Niacin Er] Rash    Social History   Socioeconomic History   Marital status: Married    Spouse name: [name]   Number of children: Not on file   Years of education: BA   Highest education level: Not on file  Occupational History   Occupation: Retired  Tobacco Use   Smoking status: Former    Packs/day: 2.00    Years: 20.00    Pack years: 40.00    Types: Cigarettes    Quit date: 07/31/1968    Years since quitting: 53.1   Smokeless tobacco: Never  Vaping Use   Vaping Use: Never used  Substance and  Sexual Activity   Alcohol use: Yes    Alcohol/week: 4.0 standard drinks    Types: 4 Glasses of wine per week   Drug use: No   Sexual activity: Not Currently  Other Topics Concern   Not on file  Social History Narrative   Lives with wife   Caffeine use: Decaf coffee, tea   Left handed   Social Determinants of Health   Financial Resource Strain: Not on file  Food Insecurity: Not on file  Transportation Needs: Not on file  Physical Activity: Not on file  Stress: Not on file  Social Connections: Not on file  Intimate Partner Violence: Not on file     Review of Systems: General: negative  for chills, fever, night sweats or weight changes.  Cardiovascular: negative for chest pain, dyspnea on exertion, edema, orthopnea, palpitations, paroxysmal nocturnal dyspnea or shortness of breath Dermatological: negative for rash Respiratory: negative for cough or wheezing Urologic: negative for hematuria Abdominal: negative for nausea, vomiting, diarrhea, bright red blood per rectum, melena, or hematemesis Neurologic: negative for visual changes, syncope, or dizziness All other systems reviewed and are otherwise negative except as noted above.    Blood pressure 135/60, pulse 77, height 5\' 4"  (1.626 m), weight 172 lb 12.8 oz (78.4 kg), SpO2 97 %.  General appearance: alert and no distress Neck: no adenopathy, no JVD, supple, symmetrical, trachea midline, thyroid not enlarged, symmetric, no tenderness/mass/nodules, and bilateral carotid bruits Lungs: clear to auscultation bilaterally Heart: regular rate and rhythm, S1, S2 normal, no murmur, click, rub or gallop Extremities: extremities normal, atraumatic, no cyanosis or edema Pulses: Diminished pedal pulses Skin: Skin color, texture, turgor normal. No rashes or lesions Neurologic: Grossly normal  EKG not performed today  ASSESSMENT AND PLAN:   Bilateral carotid artery disease (HCC) History of carotid artery disease status post remote  carotid endarterectomy on the left/17/12 by Dr. Scot Dock left common femoral endarterectomy.  His most recent carotid Doppler studies performed 08/19/2021 revealed moderate left ICA stenosis.  This will be repeated on an annual basis.  Claudication [facility]) History of PAD status post distal abdominal aortic stenting by myself using a 10 mm x 29 mm long balloon expandable stent 04/24/2016.  I restudied him 08/05/2018 revealing a patent stent without a pullback gradient.  He did have a 95% calcified left common femoral artery stenosis as well as moderate to severe distal right SFA disease.  He complains of bilateral lifestyle limiting calf claudication.  I am going to refer him back to Dr. Scot Dock for consideration of left common femoral endarterectomy with patch angioplasty after which I can pursue distal right SFA intervention using endovascular techniques.     [name] FACP,FACC,FAHA, [facility] 09/06/2021 10:37 AM

## 2021-09-06 NOTE — Assessment & Plan Note (Signed)
History of PAD status post distal abdominal aortic stenting by myself using a 10 mm x 29 mm long balloon expandable stent 04/24/2016.  I restudied him 08/05/2018 revealing a patent stent without a pullback gradient.  He did have a 95% calcified left common femoral artery stenosis as well as moderate to severe distal right SFA disease.  He complains of bilateral lifestyle limiting calf claudication.  I am going to refer him back to Dr. Scot Dock for consideration of left common femoral endarterectomy with patch angioplasty after which I can pursue distal right SFA intervention using endovascular techniques.

## 2021-09-06 NOTE — Progress Notes (Signed)
09/06/2021 Nathan Santiago [place]   [birth year]  [id number]  Primary Physician Nathan Bowen, MD Primary Cardiologist: Nathan Harp MD Nathan Santiago, [place], [place]  HPI:  Nathan Santiago is a 88 y.o.  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 He was formally a patient of Nathan Santiago.  I last saw him in the office 06/03/2021.  He is accompanied by his wife Nathan Santiago today.[name] His cardiovascular status profile is remarkable for remote tobacco abuse, 2 hypertension and hyperlipidemia. He is not diabetic. [place] 5 days a week 25 minutes a day. He hasn't known cardiovascular disease status post stenting of his proximal mid and distal RCA using overlapping stents as well as the proximal AV groove circumflex with a known occluded OM branch which is old. He has normal LV function by 2-D echo performed 02/07/12 a nonischemic Myoview 2010. He denies chest pain, shortness of breath or claudication. He had carotid Dopplers performed 01/09/14 that showed a widely patent right carotid endarterectomy site with moderate left ICA stenosis remained stable. Since I saw him in January of this year he's noticed increasing bilateral lower extremity claudication which is lifestyle limiting. Dopplers have shown a decline in his ABIs bilaterally. He does have high frequency signal in his right greater than left iliac systems. I performed peripheral angiography on him 04/24/16 revealing a 75% distal abdominal aortic stenosis with a 60 mm pullback gradient. He also had a 95% calcified distal left common femoral artery stenosis and an 80% segmental mid right SFA stenosis with 2 vessel runoff bilaterally. I stented his distal abdominal aorta with a 10 x 29 mm long balloon expandable stent. His ABIs and increased from 0.7 bilaterally up to 1 on the right and 0.8 on the left. His claudication completely resolved. He was able to go on a trip to  the [facility] and ambulated without limitation. Since I saw him a year ago he is complained of bilateral thigh claudication which is lifestyle limiting.  His Dopplers do appear to show moderate iliac disease.    I performed outpatient peripheral angiography on 08/05/2018 revealing a patent distal abdominal aortic stent without a gradient and otherwise unchanged anatomy with a 95% calcified left common femoral artery stenosis and bilateral moderate SFA disease.   Since I saw him 3 months ago he continues to complain of bilateral lifestyle limiting claudication.  He did have Doppler studies performed 11/17/2020 revealing what appears to be a patent distal aortic stent and iliac arteries with a high-frequency signal in the distal left common femoral and distal right SFA.  I am going to refer him to Dr. Scot Santiago  who performed his carotid endarterectomy 11/15/2010.   Current Meds  Medication Sig   acetaminophen (TYLENOL) 500 MG tablet Take 1 tablet (500 mg total) by mouth 2 (two) times daily as needed.   amLODipine (NORVASC) 5 MG tablet Take 5 mg by mouth daily.   Apoaequorin (PREVAGEN PO) Take 1 tablet by mouth daily.   aspirin EC 81 MG tablet Take 1 tablet (81 mg total) by mouth 2 (two) times a day.   atenolol (TENORMIN) 50 MG tablet Take 50 mg by mouth daily.   atorvastatin (LIPITOR) 40 MG tablet Take 40 mg by mouth daily at 6 PM.    benzonatate (TESSALON) 100 MG capsule Take 100 mg by mouth at bedtime as needed for cough.  Biotin 10 MG CAPS Take 10 mg by mouth daily.   bismuth subsalicylate (PEPTO BISMOL) 262 MG/15ML suspension Take 30 mLs by mouth every 6 (six) hours as needed for indigestion.   cholecalciferol (VITAMIN D3) 25 MCG (1000 UT) tablet Take 1,000 Units by mouth daily.   cilostazol (PLETAL) 50 MG tablet Take 1 tablet (50 mg total) by mouth 2 (two) times daily.   clopidogrel (PLAVIX) 75 MG tablet Take 75 mg by mouth every evening.    COVID-19 mRNA vaccine, Moderna, 100 MCG/0.5ML  injection Inject into the muscle.   diazepam (VALIUM) 5 MG tablet TAKE 1/2 TO 1 TABLET BY  MOUTH EVERY 8 HOURS AS  NEEDED (CAUTION: CAUSES  SEDATION)   diphenhydrAMINE (SOMINEX) 25 MG tablet diphenhydramine 25 mg tablet   25 mg by oral route.   finasteride (PROSCAR) 5 MG tablet    hydrochlorothiazide 25 MG tablet Take 25 mg by mouth daily.   HYDROcodone bit-homatropine (HYCODAN) 5-1.5 MG/5ML syrup Take 5 mLs by mouth every 6 (six) hours.   losartan (COZAAR) 50 MG tablet Take 50 mg by mouth every evening.    Magnesium 250 MG TABS Take 250 mg by mouth at bedtime.   meclizine (ANTIVERT) 12.5 MG tablet    Misc Natural Products (GLUCOSAMINE CHONDROITIN TRIPLE) TABS Take 2 tablets by mouth daily.    Multiple Vitamin (MULTIVITAMIN WITH MINERALS) TABS tablet Take 1 tablet by mouth daily.   Omega-3 Fatty Acids (FISH OIL BURP-LESS) 1000 MG CAPS TAKE 1 CAPSULE BY MOUTH ONCE EVERY DAY   pantoprazole (PROTONIX) 40 MG tablet TAKE 1 TABLET BY MOUTH TWICE A DAY   potassium chloride SA (K-DUR,KLOR-CON) 20 MEQ tablet Take 20 mEq by mouth every evening.    tamsulosin (FLOMAX) 0.4 MG CAPS capsule Take 2 capsules by mouth at bedtime.   traMADol (ULTRAM) 50 MG tablet    triamcinolone cream (KENALOG) 0.1 % Apply 1 application topically 2 (two) times daily. To back rash   vitamin C (ASCORBIC ACID) 500 MG tablet Take 500 mg by mouth daily.     Allergies  Allergen Reactions   Lisinopril Cough   Niaspan [Niacin Er] Rash    Social History   Socioeconomic History   Marital status: Married    Spouse name: Nathan Santiago   Number of children: Not on file   Years of education: BA   Highest education level: Not on file  Occupational History   Occupation: Retired  Tobacco Use   Smoking status: Former    Packs/day: 2.00    Years: 20.00    Pack years: 40.00    Types: Cigarettes    Quit date: 07/31/1968    Years since quitting: 53.1   Smokeless tobacco: Never  Vaping Use   Vaping Use: Never used  Substance and  Sexual Activity   Alcohol use: Yes    Alcohol/week: 4.0 standard drinks    Types: 4 Glasses of wine per week   Drug use: No   Sexual activity: Not Currently  Other Topics Concern   Not on file  Social History Narrative   Lives with wife   Caffeine use: Decaf coffee, tea   Left handed   Social Determinants of Health   Financial Resource Strain: Not on file  Food Insecurity: Not on file  Transportation Needs: Not on file  Physical Activity: Not on file  Stress: Not on file  Social Connections: Not on file  Intimate Partner Violence: Not on file     Review of Systems: General: negative  for chills, fever, night sweats or weight changes.  Cardiovascular: negative for chest pain, dyspnea on exertion, edema, orthopnea, palpitations, paroxysmal nocturnal dyspnea or shortness of breath Dermatological: negative for rash Respiratory: negative for cough or wheezing Urologic: negative for hematuria Abdominal: negative for nausea, vomiting, diarrhea, bright red blood per rectum, melena, or hematemesis Neurologic: negative for visual changes, syncope, or dizziness All other systems reviewed and are otherwise negative except as noted above.    Blood pressure 135/60, pulse 77, height 5\' 4"  (1.626 m), weight 172 lb 12.8 oz (78.4 kg), SpO2 97 %.  General appearance: alert and no distress Neck: no adenopathy, no JVD, supple, symmetrical, trachea midline, thyroid not enlarged, symmetric, no tenderness/mass/nodules, and bilateral carotid bruits Lungs: clear to auscultation bilaterally Heart: regular rate and rhythm, S1, S2 normal, no murmur, click, rub or gallop Extremities: extremities normal, atraumatic, no cyanosis or edema Pulses: Diminished pedal pulses Skin: Skin color, texture, turgor normal. No rashes or lesions Neurologic: Grossly normal  EKG not performed today  ASSESSMENT AND PLAN:   Bilateral carotid artery disease (HCC) History of carotid artery disease status post remote  carotid endarterectomy on the left/17/12 by Dr. Scot Santiago left common femoral endarterectomy.  His most recent carotid Doppler studies performed 08/19/2021 revealed moderate left ICA stenosis.  This will be repeated on an annual basis.  Claudication [facility]) History of PAD status post distal abdominal aortic stenting by myself using a 10 mm x 29 mm long balloon expandable stent 04/24/2016.  I restudied him 08/05/2018 revealing a patent stent without a pullback gradient.  He did have a 95% calcified left common femoral artery stenosis as well as moderate to severe distal right SFA disease.  He complains of bilateral lifestyle limiting calf claudication.  I am going to refer him back to Dr. Scot Santiago for consideration of left common femoral endarterectomy with patch angioplasty after which I can pursue distal right SFA intervention using endovascular techniques.     Nathan Harp MD FACP,FACC,FAHA, [facility] 09/06/2021 10:37 AM

## 2021-09-20 ENCOUNTER — Telehealth: Payer: Self-pay

## 2021-09-20 ENCOUNTER — Other Ambulatory Visit (HOSPITAL_COMMUNITY): Payer: Self-pay | Admitting: Cardiovascular Disease

## 2021-09-20 DIAGNOSIS — I739 Peripheral vascular disease, unspecified: Secondary | ICD-10-CM

## 2021-09-20 DIAGNOSIS — I6523 Occlusion and stenosis of bilateral carotid arteries: Secondary | ICD-10-CM

## 2021-09-20 NOTE — Telephone Encounter (Signed)
Left message on for pt to return call regarding PV angiogram requested by Dr. Scot Dock.

## 2021-09-21 ENCOUNTER — Encounter: Payer: Self-pay | Admitting: Podiatry

## 2021-09-21 ENCOUNTER — Other Ambulatory Visit: Payer: Self-pay

## 2021-09-21 ENCOUNTER — Ambulatory Visit: Payer: Medicare Other | Admitting: Podiatry

## 2021-09-21 DIAGNOSIS — I739 Peripheral vascular disease, unspecified: Secondary | ICD-10-CM

## 2021-09-21 DIAGNOSIS — M722 Plantar fascial fibromatosis: Secondary | ICD-10-CM

## 2021-09-21 MED ORDER — TRIAMCINOLONE ACETONIDE 10 MG/ML IJ SUSP
20.0000 mg | Freq: Once | INTRAMUSCULAR | Status: AC
  Administered 2021-09-21: 20 mg

## 2021-09-21 MED ORDER — SODIUM CHLORIDE 0.9% FLUSH: 3.0000 mL | Freq: Two times a day (BID) | INTRAVENOUS | Status: DC

## 2021-09-21 NOTE — Progress Notes (Signed)
Subjective:   Patient ID: Nathan Santiago, male   DOB: 88 y.o.   MRN: [id number]   HPI Patient states he has had quicker reoccurrence of heel pain and wants injections understanding he generally can get of this quickly   ROS      Objective:  Physical Exam  Neuro vascular status intact with inflammation pain of the plantar fascia at the insertion bilateral     Assessment:  Cute reoccurrence Planter fasciitis bilateral     Plan:  I explained that generally I want him to wait longer in between visits but organ to try 1 more time to get him out of this significant discomfort he is in and he understands risk and today I did sterile prep and injected the plantar fascia at insertion 3 mg Kenalog 5 mg Xylocaine

## 2021-09-21 NOTE — Telephone Encounter (Signed)
Patient returned call

## 2021-09-21 NOTE — Telephone Encounter (Signed)
Spoke with pt regarding PV angiogram to be done by Dr. Gwenlyn Found at the request of Dr. Scot Dock prior to endarterectomy. Instructions for procedure and labs explained to pt. Pt verbalizes understanding and will call back if he has any additional questions.  Pt plans to have labs drawn an our office tomorrow after lunch.  [address] [place] [address] Dept: [phone] Loc: [place]  09/21/2021  You are scheduled for a Peripheral Angiogram on Monday, February 27 with Dr. [name].  1. Please arrive at the [facility] (Main Entrance A) at [facility]: [address] [place], [place] [address] at 7:30 AM (This time is two hours before your procedure to ensure your preparation). Free valet parking service is available.   Special note: Every effort is made to have your procedure done on time. Please understand that emergencies sometimes delay scheduled procedures.  2. Diet: Do not eat solid foods after midnight.  The patient may have clear liquids until 5am upon the day of the procedure.  3. Labs: doing tomorrow (09/22/21)   4. Medication instructions in preparation for your procedure:   Stop taking, HTCZ (Hydrochlorothiazide) Monday, February 27,   On the morning of your procedure, take your Aspirin and Plavix/Clopidogrel and any morning medicines NOT listed above.  You may use sips of water.  5. Plan for one night stay--bring personal belongings. 6. Bring a current list of your medications and current insurance cards. 7. You MUST have a responsible person to drive you home. 8. Someone MUST be with you the first 24 hours after you arrive home or your discharge will be delayed. 9. Please wear clothes that are easy to get on and off and wear slip-on shoes.  Thank you for allowing [place] to care for you!   -- [facility]

## 2021-09-21 NOTE — Addendum Note (Signed)
Addended by: Beatrix Fetters on: 09/21/2021 04:31 PM   Modules accepted: Orders

## 2021-09-22 ENCOUNTER — Telehealth: Payer: Self-pay | Admitting: *Deleted

## 2021-09-22 NOTE — Telephone Encounter (Addendum)
Abdominal aortogram scheduled at [facility] for:  Monday September 26, 2021 9:30 AM [facility] Main Entrance A [facility]) at: 7:30 AM   Diet-no solid food after midnight prior to cath, clear liquids until 5 AM day of procedure.  Medication instructions for procedure: -Hold:  HCTZ-KCl-AM of procedure -Except hold medications usual morning medications can be taken pre-cath with sips of water including aspirin 81 mg and Plavix 75 mg.    Must have responsible adult to drive home post procedure and be with patient first 24 hours after arriving home.  [facility] does allow one visitor to wait in the waiting room during the time you are there.   Patient reports does not currently have any new symptoms concerning for COVID-19 and no household members with COVID-19 like illness.             Reviewed procedure instructions with patient.

## 2021-09-24 ENCOUNTER — Other Ambulatory Visit: Payer: Self-pay

## 2021-09-24 DIAGNOSIS — I739 Peripheral vascular disease, unspecified: Secondary | ICD-10-CM

## 2021-09-26 ENCOUNTER — Encounter (HOSPITAL_COMMUNITY)
Admission: RE | Disposition: A | Discharge status: Home / Self Care | Payer: Self-pay | Source: Home / Self Care | Attending: Cardiovascular Disease

## 2021-09-26 ENCOUNTER — Ambulatory Visit (HOSPITAL_COMMUNITY)
Admission: RE | Admit: 2021-09-26 | Discharge: 2021-09-26 | Disposition: A | Discharge status: Home / Self Care | Payer: Medicare Other | Attending: Cardiovascular Disease | Admitting: Cardiovascular Disease

## 2021-09-26 ENCOUNTER — Other Ambulatory Visit: Payer: Self-pay

## 2021-09-26 DIAGNOSIS — I70213 Atherosclerosis of native arteries of extremities with intermittent claudication, bilateral legs: Secondary | ICD-10-CM | POA: Diagnosis not present

## 2021-09-26 DIAGNOSIS — I10 Essential (primary) hypertension: Secondary | ICD-10-CM | POA: Diagnosis not present

## 2021-09-26 DIAGNOSIS — E785 Hyperlipidemia, unspecified: Secondary | ICD-10-CM | POA: Diagnosis not present

## 2021-09-26 DIAGNOSIS — Z87891 Personal history of nicotine dependence: Secondary | ICD-10-CM | POA: Insufficient documentation

## 2021-09-26 DIAGNOSIS — I6523 Occlusion and stenosis of bilateral carotid arteries: Secondary | ICD-10-CM | POA: Diagnosis not present

## 2021-09-26 DIAGNOSIS — I739 Peripheral vascular disease, unspecified: Secondary | ICD-10-CM | POA: Diagnosis present

## 2021-09-26 HISTORY — PX: ABDOMINAL AORTOGRAM W/LOWER EXTREMITY: CATH118223

## 2021-09-26 LAB — CBC
HCT: 31.7 % — ABNORMAL LOW (ref 39.0–52.0)
Hemoglobin: 9.9 g/dL — ABNORMAL LOW (ref 13.0–17.0)
MCH: 25.5 pg — ABNORMAL LOW (ref 26.0–34.0)
MCHC: 31.2 g/dL (ref 30.0–36.0)
MCV: 81.7 fL (ref 80.0–100.0)
Platelets: 302 10*3/uL (ref 150–400)
RBC: 3.88 MIL/uL — ABNORMAL LOW (ref 4.22–5.81)
RDW: 13.1 % (ref 11.5–15.5)
WBC: 9.3 10*3/uL (ref 4.0–10.5)
nRBC: 0 % (ref 0.0–0.2)

## 2021-09-26 LAB — BASIC METABOLIC PANEL
Anion gap: 8 (ref 5–15)
BUN: 20 mg/dL (ref 8–23)
CO2: 28 mmol/L (ref 22–32)
Calcium: 9.3 mg/dL (ref 8.9–10.3)
Chloride: 104 mmol/L (ref 98–111)
Creatinine, Ser: 1.07 mg/dL (ref 0.61–1.24)
GFR, Estimated: >60 mL/min (ref >60)
Glucose, Bld: 111 mg/dL — ABNORMAL HIGH (ref 70–99)
Potassium: 3.7 mmol/L (ref 3.5–5.1)
Sodium: 140 mmol/L (ref 135–145)

## 2021-09-26 SURGERY — ABDOMINAL AORTOGRAM W/LOWER EXTREMITY
Anesthesia: LOCAL

## 2021-09-26 MED ORDER — AMLODIPINE BESYLATE 5 MG PO TABS: 5.0000 mg | ORAL_TABLET | Freq: Every day | ORAL | Status: DC

## 2021-09-26 MED ORDER — HYDRALAZINE HCL 20 MG/ML IJ SOLN: 5.0000 mg | INTRAMUSCULAR | Status: DC | PRN

## 2021-09-26 MED ORDER — ATENOLOL 50 MG PO TABS: 50.0000 mg | ORAL_TABLET | Freq: Every day | ORAL | Status: DC

## 2021-09-26 MED ORDER — POTASSIUM CHLORIDE CRYS ER 20 MEQ PO TBCR
20.0000 meq | EXTENDED_RELEASE_TABLET | Freq: Every evening | ORAL | Status: DC
Start: 2021-09-26 — End: 2021-09-26

## 2021-09-26 MED ORDER — SODIUM CHLORIDE 0.9 % WEIGHT BASED INFUSION: 1.0000 mL/kg/h | INTRAVENOUS | Status: DC

## 2021-09-26 MED ORDER — MIDAZOLAM HCL 2 MG/2ML IJ SOLN
INTRAMUSCULAR | Status: DC | PRN
  Administered 2021-09-26: 1 mg via INTRAVENOUS

## 2021-09-26 MED ORDER — LOSARTAN POTASSIUM 50 MG PO TABS: 50.0000 mg | ORAL_TABLET | Freq: Every evening | ORAL | Status: DC

## 2021-09-26 MED ORDER — SODIUM CHLORIDE 0.9 % IV SOLN: 250.0000 mL | INTRAVENOUS | Status: DC | PRN

## 2021-09-26 MED ORDER — ASPIRIN 81 MG PO CHEW: 81.0000 mg | CHEWABLE_TABLET | ORAL | Status: DC

## 2021-09-26 MED ORDER — HEPARIN (PORCINE) IN NACL 1000-0.9 UT/500ML-% IV SOLN
INTRAVENOUS | Status: AC
  Filled 2021-09-26: qty 1000

## 2021-09-26 MED ORDER — SODIUM CHLORIDE 0.9% FLUSH: 3.0000 mL | INTRAVENOUS | Status: DC | PRN

## 2021-09-26 MED ORDER — MORPHINE SULFATE (PF) 2 MG/ML IV SOLN: 2.0000 mg | INTRAVENOUS | Status: DC | PRN

## 2021-09-26 MED ORDER — MIDAZOLAM HCL 2 MG/2ML IJ SOLN
INTRAMUSCULAR | Status: AC
  Filled 2021-09-26: qty 2

## 2021-09-26 MED ORDER — LABETALOL HCL 5 MG/ML IV SOLN: 10.0000 mg | INTRAVENOUS | Status: DC | PRN

## 2021-09-26 MED ORDER — SODIUM CHLORIDE 0.9 % IV SOLN: INTRAVENOUS | Status: AC

## 2021-09-26 MED ORDER — HYDRALAZINE HCL 20 MG/ML IJ SOLN
INTRAMUSCULAR | Status: AC
  Filled 2021-09-26: qty 1

## 2021-09-26 MED ORDER — MAGNESIUM 250 MG PO TABS: 250.0000 mg | ORAL_TABLET | Freq: Every day | ORAL | Status: DC

## 2021-09-26 MED ORDER — TAMSULOSIN HCL 0.4 MG PO CAPS: 0.8000 mg | ORAL_CAPSULE | Freq: Every day | ORAL | Status: DC

## 2021-09-26 MED ORDER — ACETAMINOPHEN 325 MG PO TABS: 650.0000 mg | ORAL_TABLET | ORAL | Status: DC | PRN

## 2021-09-26 MED ORDER — CILOSTAZOL 50 MG PO TABS
50.0000 mg | ORAL_TABLET | Freq: Two times a day (BID) | ORAL | Status: DC
Start: 2021-09-26 — End: 2021-09-26

## 2021-09-26 MED ORDER — LIDOCAINE HCL (PF) 1 % IJ SOLN
INTRAMUSCULAR | Status: DC | PRN
Start: 2021-09-26 — End: 2021-09-26
  Administered 2021-09-26: 18 mL via INTRADERMAL

## 2021-09-26 MED ORDER — HYDRALAZINE HCL 20 MG/ML IJ SOLN
INTRAMUSCULAR | Status: DC | PRN
  Administered 2021-09-26 (×2): 10 mg via INTRAVENOUS

## 2021-09-26 MED ORDER — IODIXANOL 320 MG/ML IV SOLN
INTRAVENOUS | Status: DC | PRN
  Administered 2021-09-26: 172 mL via INTRA_ARTERIAL

## 2021-09-26 MED ORDER — ONDANSETRON HCL 4 MG/2ML IJ SOLN: 4.0000 mg | Freq: Four times a day (QID) | INTRAMUSCULAR | Status: DC | PRN

## 2021-09-26 MED ORDER — ATORVASTATIN CALCIUM 40 MG PO TABS
40.0000 mg | ORAL_TABLET | Freq: Every day | ORAL | Status: DC
Start: 2021-09-26 — End: 2021-09-26

## 2021-09-26 MED ORDER — SODIUM CHLORIDE 0.9% FLUSH: 3.0000 mL | Freq: Two times a day (BID) | INTRAVENOUS | Status: DC

## 2021-09-26 MED ORDER — CLOPIDOGREL BISULFATE 75 MG PO TABS: 75.0000 mg | ORAL_TABLET | Freq: Every day | ORAL | Status: DC

## 2021-09-26 MED ORDER — FENTANYL CITRATE (PF) 100 MCG/2ML IJ SOLN
INTRAMUSCULAR | Status: AC
  Filled 2021-09-26: qty 2

## 2021-09-26 MED ORDER — HEPARIN (PORCINE) IN NACL 1000-0.9 UT/500ML-% IV SOLN
INTRAVENOUS | Status: DC | PRN
  Administered 2021-09-26 (×2): 500 mL

## 2021-09-26 MED ORDER — CLOPIDOGREL BISULFATE 75 MG PO TABS
75.0000 mg | ORAL_TABLET | Freq: Every evening | ORAL | Status: DC
Start: 2021-09-26 — End: 2021-09-26

## 2021-09-26 MED ORDER — FENTANYL CITRATE (PF) 100 MCG/2ML IJ SOLN
INTRAMUSCULAR | Status: DC | PRN
  Administered 2021-09-26: 25 ug via INTRAVENOUS

## 2021-09-26 MED ORDER — TRAMADOL HCL 50 MG PO TABS: 50.0000 mg | ORAL_TABLET | Freq: Four times a day (QID) | ORAL | Status: DC | PRN

## 2021-09-26 MED ORDER — SODIUM CHLORIDE 0.9 % WEIGHT BASED INFUSION
3.0000 mL/kg/h | INTRAVENOUS | Status: DC
  Administered 2021-09-26: 3 mL/kg/h via INTRAVENOUS

## 2021-09-26 MED ORDER — ASPIRIN EC 81 MG PO TBEC: 81.0000 mg | DELAYED_RELEASE_TABLET | Freq: Every day | ORAL | Status: DC

## 2021-09-26 MED ORDER — PANTOPRAZOLE SODIUM 40 MG PO TBEC: 40.0000 mg | DELAYED_RELEASE_TABLET | Freq: Two times a day (BID) | ORAL | Status: DC

## 2021-09-26 MED ORDER — LIDOCAINE HCL (PF) 1 % IJ SOLN
INTRAMUSCULAR | Status: AC
  Filled 2021-09-26: qty 30

## 2021-09-26 MED ORDER — HYDROCHLOROTHIAZIDE 25 MG PO TABS: 25.0000 mg | ORAL_TABLET | Freq: Every day | ORAL | Status: DC

## 2021-09-26 SURGICAL SUPPLY — 10 items
CATH ANGIO 5F PIGTAIL 65CM (CATHETERS) ×2
CATH SOFT-VU 4F 65 STRAIGHT (CATHETERS) ×1
CATH SOFT-VU STRAIGHT 4F 65CM (CATHETERS) ×2
KIT PV (KITS) ×2
SHEATH PINNACLE 5F 10CM (SHEATH) ×2
SHEATH PROBE COVER 6X72 (BAG) ×2
SYR MEDRAD MARK 7 150ML (SYRINGE) ×2
TRANSDUCER W/STOPCOCK (MISCELLANEOUS) ×2
TRAY PV CATH (CUSTOM PROCEDURE TRAY) ×2
WIRE HITORQ VERSACORE ST 145CM (WIRE) ×2

## 2021-09-26 NOTE — Telephone Encounter (Signed)
[facility] reports no BMP/CBC results in their system, orders placed for BMP/CBC on arrival to Short Stay 09/26/21.

## 2021-09-26 NOTE — Interval H&P Note (Signed)
History and Physical Interval Note:  09/26/2021 9:13 AM  Nathan Santiago  has presented today for surgery, with the diagnosis of pvd.  The various methods of treatment have been discussed with the patient and family. After consideration of risks, benefits and other options for treatment, the patient has consented to  Procedure(s): ABDOMINAL AORTOGRAM W/LOWER EXTREMITY (N/A) as a surgical intervention.  The patient's history has been reviewed, patient examined, no change in status, stable for surgery.  I have reviewed the patient's chart and labs.  Questions were answered to the patient's satisfaction.     [name]

## 2021-09-26 NOTE — Progress Notes (Signed)
SITE AREA: right groin/femoral  SITE PRIOR TO REMOVAL:  LEVEL 0  PRESSURE APPLIED FOR: approximately 20 minutes  MANUAL: yes  PATIENT STATUS DURING PULL: stable  POST PULL SITE:  LEVEL 0  POST PULL INSTRUCTIONS GIVEN: yes  POST PULL PULSES PRESENT: bilateral pedal pulses dopplerable  DRESSING APPLIED: gauze with tegaderm  BEDREST BEGINS @ 1056  COMMENTS:

## 2021-09-27 ENCOUNTER — Encounter (HOSPITAL_COMMUNITY): Payer: Self-pay | Admitting: Cardiovascular Disease

## 2021-09-29 ENCOUNTER — Other Ambulatory Visit: Payer: Self-pay

## 2021-09-29 ENCOUNTER — Ambulatory Visit: Payer: Medicare Other | Admitting: Vascular Surgery

## 2021-09-29 ENCOUNTER — Encounter: Payer: Self-pay | Admitting: Vascular Surgery

## 2021-09-29 ENCOUNTER — Ambulatory Visit (HOSPITAL_COMMUNITY)
Admission: RE | Admit: 2021-09-29 | Discharge: 2021-09-29 | Disposition: A | Discharge status: Home / Self Care | Payer: Medicare Other | Source: Ambulatory Visit | Attending: Vascular Surgery | Admitting: Vascular Surgery

## 2021-09-29 VITALS — BP 188/74 | HR 74 | Temp 98.0°F | Resp 20 | Ht 64.0 in | Wt 171.0 lb

## 2021-09-29 DIAGNOSIS — I70219 Atherosclerosis of native arteries of extremities with intermittent claudication, unspecified extremity: Secondary | ICD-10-CM

## 2021-09-29 DIAGNOSIS — I739 Peripheral vascular disease, unspecified: Secondary | ICD-10-CM | POA: Insufficient documentation

## 2021-09-29 NOTE — Progress Notes (Signed)
ASSESSMENT & PLAN   PERIPHERAL ARTERIAL DISEASE WITH CLAUDICATION: This patient has stable bilateral lower extremity claudication.  The most significant stenosis on his arteriogram is his left common femoral artery stenosis which looks severe.  However his symptoms are really fairly stable and he has no rest pain or nonhealing ulcers.  Given his age I would not recommend femoral endarterectomy unless his symptoms progress.  We have discussed the importance of exercise and how walking improves collateral flow.  I have also discussed with him the importance of nutrition.  Fortunately he quit smoking many years ago.  Certainly if his symptoms progress I think he would be a good candidate for left femoral endarterectomy with patch angioplasty however given his age this would be associated with slightly increased risk.  BILATERAL CAROTID DISEASE: Based on his carotid duplex scan earlier this year his right carotid endarterectomy site is widely patent.  Dr. Alvester Chou is following a 81 to 59% left carotid stenosis.  He is on aspirin, Plavix, and a statin.  REASON FOR CONSULT:    Bilateral lower extremity claudication.  The consult is requested by Dr. [name].  HPI:   Nathan Santiago is a 88 y.o. male who presents with a long history of bilateral lower extremity claudication.  He thinks his symptoms may be slightly worse on the right side although the symptoms are about equal on both sides.  He experiences pain in his thighs which is brought on by ambulation and relieved with rest.  However he also notes that he has pain in his thighs simply with standing.  He has some calf claudication but says that his symptoms are more so in his thigh.  He denies any history of rest pain or nonhealing ulcers.  His risk factors for peripheral vascular disease include hypertension, hypercholesterolemia, and a history of tobacco use.  He smoked 2 packs/day for 20 years but quit in 1970.  He denies any history of  diabetes or family history of premature cardiovascular disease.  He has undergone previous PTCA.  Of note, I performed a right carotid endarterectomy on in 2012 for an asymptomatic high-grade carotid stenosis.  His carotid disease has been followed by Dr. Gwenlyn Found.     Past Medical History:  Diagnosis Date   AAA (abdominal aortic aneurysm)    asymptomatic   AAA (abdominal aortic aneurysm) 2010   peripheral  angiogram-- bilateral SFA DISEASE  and 60 to 70% infrarenaal abd. aortic stenosis with 15 -mm gradient   Adenomatous colon polyp 11/2003   CAD (coronary artery disease)    Gait abnormality 02/13/2017   Gastroparesis    pt denies   GERD (gastroesophageal reflux disease)    w/ LPR   History of kidney stones    x2   Hyperlipidemia    Hypertension    Peripheral arterial disease ([place])    RCEA  by Dr Lemar Livings   PUD (peptic ulcer disease)    pt unaware   Vertigo     Family History  Problem Relation Age of Onset   Ovarian cancer Sister    Heart disease Father    Brain cancer Brother        Tumor    SOCIAL HISTORY: Social History   Tobacco Use   Smoking status: Former    Packs/day: 2.00    Years: 20.00    Pack years: 40.00    Types: Cigarettes    Quit date: 07/31/1968    Years since quitting: 46.2  Smokeless tobacco: Never  Substance Use Topics   Alcohol use: Yes    Alcohol/week: 4.0 standard drinks    Types: 4 Glasses of wine per week    Allergies  Allergen Reactions   Lisinopril Cough   Niaspan [Niacin Er] Rash    Current Outpatient Medications  Medication Sig Dispense Refill   acetaminophen (TYLENOL) 500 MG tablet Take 1 tablet (500 mg total) by mouth 2 (two) times daily as needed. 30 tablet 2   amLODipine (NORVASC) 5 MG tablet Take 5 mg by mouth daily.     Apoaequorin (PREVAGEN PO) Take 1 tablet by mouth daily.     aspirin EC 81 MG tablet Take 1 tablet (81 mg total) by mouth 2 (two) times a day. (Patient taking differently: Take 81 mg by mouth  daily.) 60 tablet 0   atenolol (TENORMIN) 50 MG tablet Take 50 mg by mouth daily.     atorvastatin (LIPITOR) 40 MG tablet Take 40 mg by mouth daily at 6 PM.      Biotin 10 MG CAPS Take 10 mg by mouth daily.     bismuth subsalicylate (PEPTO BISMOL) 262 MG/15ML suspension Take 30 mLs by mouth every 6 (six) hours as needed for indigestion.     cholecalciferol (VITAMIN D3) 25 MCG (1000 UT) tablet Take 1,000 Units by mouth daily.     cilostazol (PLETAL) 50 MG tablet Take 1 tablet (50 mg total) by mouth 2 (two) times daily. 180 tablet 2   clopidogrel (PLAVIX) 75 MG tablet Take 75 mg by mouth every evening.      COVID-19 mRNA vaccine, Moderna, 100 MCG/0.5ML injection Inject into the muscle. 0.25 mL 0   diazepam (VALIUM) 5 MG tablet Take 2.5-5 mg by mouth every 8 (eight) hours as needed (vertigo).     finasteride (PROSCAR) 5 MG tablet Take 5 mg by mouth daily.     hydrochlorothiazide 25 MG tablet Take 25 mg by mouth daily.     HYDROcodone bit-homatropine (HYCODAN) 5-1.5 MG/5ML syrup Take 5 mLs by mouth every 6 (six) hours as needed for cough.     losartan (COZAAR) 50 MG tablet Take 50 mg by mouth every evening.      Magnesium 250 MG TABS Take 250 mg by mouth at bedtime.     Misc Natural Products (GLUCOSAMINE CHONDROITIN TRIPLE) TABS Take 2 tablets by mouth daily.      Multiple Vitamin (MULTIVITAMIN WITH MINERALS) TABS tablet Take 1 tablet by mouth daily.     pantoprazole (PROTONIX) 40 MG tablet TAKE 1 TABLET BY MOUTH TWICE A DAY 180 tablet 0   potassium chloride SA (K-DUR,KLOR-CON) 20 MEQ tablet Take 20 mEq by mouth every evening.      tamsulosin (FLOMAX) 0.4 MG CAPS capsule Take 0.8 mg by mouth at bedtime.     traMADol (ULTRAM) 50 MG tablet Take 50 mg by mouth every 6 (six) hours as needed for severe pain.     Current Facility-Administered Medications  Medication Dose Route Frequency Provider Last Rate Last Admin   sodium chloride flush (NS) 0.9 % injection 3 mL  3 mL Intravenous Q12H Lorretta Harp, MD        REVIEW OF SYSTEMS:  [X]  denotes positive finding, [ ]  denotes negative finding Cardiac  Comments:  Chest pain or chest pressure:    Shortness of breath upon exertion:    Short of breath when lying flat:    Irregular heart rhythm:        Vascular  Pain in calf, thigh, or hip brought on by ambulation: x   Pain in feet at night that wakes you up from your sleep:     Blood clot in your veins:    Leg swelling:         Pulmonary    Oxygen at home:    Productive cough:     Wheezing:         Neurologic    Sudden weakness in arms or legs:     Sudden numbness in arms or legs:     Sudden onset of difficulty speaking or slurred speech:    Temporary loss of vision in one eye:     Problems with dizziness:         Gastrointestinal    Blood in stool:     Vomited blood:         Genitourinary    Burning when urinating:     Blood in urine:        Psychiatric    Major depression:         Hematologic    Bleeding problems:    Problems with blood clotting too easily:        Skin    Rashes or ulcers:        Constitutional    Fever or chills:    -  PHYSICAL EXAM:   Vitals:   09/29/21 1026  BP: (!) 188/74  Pulse: 74  Resp: 20  Temp: 98 F (36.7 C)  SpO2: 93%  Weight: 171 lb (77.6 kg)  Height: 5\' 4"  (1.626 m)   Body mass index is 29.35 kg/m. GENERAL: The patient is a well-nourished male, in no acute distress. The vital signs are documented above. CARDIAC: There is a regular rate and rhythm.  VASCULAR: He has bilateral carotid bruits. He does have palpable femoral pulses. He has monophasic dorsalis pedis and posterior tibial signals bilaterally. He has no significant lower extremity swelling. PULMONARY: There is good air exchange bilaterally without wheezing or rales. ABDOMEN: Soft and non-tender with normal pitched bowel sounds.  MUSCULOSKELETAL: There are no major deformities. NEUROLOGIC: No focal weakness or paresthesias are detected. SKIN:  There are no ulcers or rashes noted. PSYCHIATRIC: The patient has a normal affect.  DATA:    ARTERIOGRAM: I have reviewed the images of his arteriogram that was done on 09/26/2021.  Of note there was a 90% ostial left renal artery stenosis.  There was a abdominal aortic stent which was present and there was no gradient across the stent.  On the right side, there was a calcific plaque in the common femoral artery.  He has moderate to severe disease in the distal right superficial femoral artery.  He had two-vessel runoff on the right.  On the left side, he had a very tight 95% calcific plaque in the common femoral artery.  There was a 75% mid left superficial femoral artery stenosis with two-vessel runoff.  The posterior tibial artery on the left was occluded.  ARTERIAL DOPPLER STUDY: I have independently interpreted his arterial Doppler study today.  On the right side, there is a monophasic dorsalis pedis and posterior tibial signal.  ABIs 87% with a toe pressure of 100 mmHg.  On the left side there is a monophasic dorsalis pedis and posterior tibial signal.  ABI 65%.  Toe pressures 100 mmHg.  CAROTID DUPLEX: I reviewed his carotid duplex scan that was done in January of this year.  His right carotid endarterectomy site was  widely patent.  He had a 40 to 59% left carotid stenosis.  Deitra [facility]

## 2021-11-17 ENCOUNTER — Encounter (HOSPITAL_COMMUNITY): Payer: Medicare Other

## 2021-11-18 ENCOUNTER — Encounter: Payer: Self-pay | Admitting: Gastroenterology

## 2021-12-06 ENCOUNTER — Encounter: Payer: Self-pay | Admitting: Cardiovascular Disease

## 2021-12-06 ENCOUNTER — Ambulatory Visit (INDEPENDENT_AMBULATORY_CARE_PROVIDER_SITE_OTHER): Payer: Medicare Other | Admitting: Cardiovascular Disease

## 2021-12-06 DIAGNOSIS — I2510 Atherosclerotic heart disease of native coronary artery without angina pectoris: Secondary | ICD-10-CM | POA: Diagnosis not present

## 2021-12-06 DIAGNOSIS — E7800 Pure hypercholesterolemia, unspecified: Secondary | ICD-10-CM

## 2021-12-06 DIAGNOSIS — I6523 Occlusion and stenosis of bilateral carotid arteries: Secondary | ICD-10-CM

## 2021-12-06 DIAGNOSIS — I739 Peripheral vascular disease, unspecified: Secondary | ICD-10-CM | POA: Diagnosis not present

## 2021-12-06 DIAGNOSIS — I10 Essential (primary) hypertension: Secondary | ICD-10-CM | POA: Diagnosis not present

## 2021-12-06 NOTE — Assessment & Plan Note (Signed)
History of peripheral arterial disease status post balloon expandable distal abdominal aortic PTA and stenting by myself 04/24/2016 with a 10 mm x 29 mm long balloon expandable stent.  He also has high-grade calcified common femoral artery stenoses, as well as SFA disease.  He does complain of claudication.  I performed angiography on him 09/26/2021 revealing a patent distal abdominal aortic stent with left greater than right calcified common femoral artery disease not amenable to percutaneous intervention.  I did send him to Dr. Gae Gallop on 09/29/2021 who did not feel he was a good operative candidate because of his age and comorbidities. ?

## 2021-12-06 NOTE — Assessment & Plan Note (Signed)
History of coronary atherosclerosis status post stenting of his proximal and mid as well as distal RCA using overlapping stents as well as to his AV groove circumflex with a known occluded OM branch.  Myoview's in the past have been nonischemic.  He denies chest pain or shortness of breath. ?

## 2021-12-06 NOTE — Assessment & Plan Note (Signed)
History of bilateral carotid artery disease status post bilateral carotid endarterectomies.  These were performed by Dr. Scot Dock.  His last carotid Doppler studies performed 08/19/2021 revealed a widely patent right carotid endarterectomy site with moderate left ICA stenosis which will be monitored annually by duplex ultrasound. ?

## 2021-12-06 NOTE — Patient Instructions (Signed)
Medication Instructions:  ?The current medical regimen is effective;  continue present plan and medications. ? ?*If you need a refill on your cardiac medications before your next appointment, please call your pharmacy* ? ? ?Testing/Procedures: ?Your physician has requested that you have a carotid duplex (in January). This test is an ultrasound of the carotid arteries in your neck. It looks at blood flow through these arteries that supply the brain with blood. Allow one hour for this exam. There are no restrictions or special instructions.  ? ? ?Follow-Up: ?At [facility], you and your health needs are our priority.  As part of our continuing mission to provide you with exceptional heart care, we have created designated Provider Care Teams.  These Care Teams include your primary Cardiologist (physician) and Advanced Practice Providers (APPs -  Physician Assistants and Nurse Practitioners) who all work together to provide you with the care you need, when you need it. ? ?We recommend signing up for the patient portal called "MyChart".  Sign up information is provided on this After Visit Summary.  MyChart is used to connect with patients for Virtual Visits (Telemedicine).  Patients are able to view lab/test results, encounter notes, upcoming appointments, etc.  Non-urgent messages can be sent to your provider as well.   ?To learn more about what you can do with MyChart, go to [email or web address].   ? ?Your next appointment:   ?6 month(s) ? ?The format for your next appointment:   ?In Person ? ?Provider:   ?Any APP    Then, [name], MD will plan to see you again in 12 month(s).  ? ? ? ? ? ? ? ? ?

## 2021-12-06 NOTE — Assessment & Plan Note (Signed)
History of essential hypertension a blood pressure measured today at 138/50.  He is on amlodipine, atenolol, hydrochlorothiazide and losartan. ?

## 2021-12-06 NOTE — Progress Notes (Signed)
? ? ? ?12/06/2021 ?Nathan Santiago   ?[birth year]  ?[id number] ? ?Primary Physician Nathan Bowen, MD ?Primary Cardiologist: Nathan Harp MD Nathan Santiago, [place] ? ?HPI:  Nathan Santiago is a 88 y.o.  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 He was formally a patient of [name].  I last saw him in the office 09/06/2021.  He is accompanied by his wife Nathan Santiago today.[name] His cardiovascular status profile is remarkable for remote tobacco abuse, 2 hypertension and hyperlipidemia. He is not diabetic. [place] 5 days a week 25 minutes a day. He hasn't known cardiovascular disease status post stenting of his proximal mid and distal RCA using overlapping stents as well as the proximal AV groove circumflex with a known occluded OM branch which is old. He has normal LV function by 2-D echo performed 02/07/12 a nonischemic Myoview 2010. He denies chest pain, shortness of breath or claudication. He had carotid Dopplers performed 01/09/14 that showed a widely patent right carotid endarterectomy site with moderate left ICA stenosis remained stable. Since I saw him in January of this year he's noticed increasing bilateral lower extremity claudication which is lifestyle limiting. Dopplers have shown a decline in his ABIs bilaterally. He does have high frequency signal in his right greater than left iliac systems. I performed peripheral angiography on him 04/24/16 revealing a 75% distal abdominal aortic stenosis with a 60 mm pullback gradient. He also had a 95% calcified distal left common femoral artery stenosis and an 80% segmental mid right SFA stenosis with 2 vessel runoff bilaterally. I stented his distal abdominal aorta with a 10 x 29 mm long balloon expandable stent. His ABIs and increased from 0.7 bilaterally up to 1 on the right and 0.8 on the left. His claudication completely resolved. He was able to go on a trip to  the [facility] and ambulated without limitation. ?Since I saw him a year ago he is complained of bilateral thigh claudication which is lifestyle limiting.  His Dopplers do appear to show moderate iliac disease. ?  ? I performed outpatient peripheral angiography on 08/05/2018 revealing a patent distal abdominal aortic stent without a gradient and otherwise unchanged anatomy with a 95% calcified left common femoral artery stenosis and bilateral moderate SFA disease. ?  ?He continues to complain of bilateral lifestyle limiting claudication.  He did have Doppler studies performed 11/17/2020 revealing what appears to be a patent distal aortic stent and iliac arteries with a high-frequency signal in the distal left common femoral and distal right SFA.  I am going to refer him to Dr. Scot Dock  who performed his carotid endarterectomy 11/15/2010. ? ?I performed peripheral angiography on him 09/26/2021 revealing a 90% left renal artery stenosis, patent distal abdominal aortic stent and high-grade calcified left greater than right common femoral artery stenosis as well as focal bilateral mid SFA disease.  I did refer him to Dr. Scot Dock who performed his endarterectomies in the past.  Dr. Scot Dock saw him on 09/29/2021 for consideration of common femoral endarterectomy which she thought the patient was not a good candidate for because of his age and comorbidities.  He denies chest pain or shortness of breath. ? ? ?Current Meds  ?Medication Sig  ? acetaminophen (TYLENOL) 500 MG tablet Take 1 tablet (500 mg total) by mouth 2 (two) times daily as needed.  ? amLODipine (NORVASC) 5 MG tablet Take 5  mg by mouth daily.  ? Apoaequorin (PREVAGEN PO) Take 1 tablet by mouth daily.  ? aspirin EC 81 MG tablet Take 1 tablet (81 mg total) by mouth 2 (two) times a day. (Patient taking differently: Take 81 mg by mouth daily.)  ? atenolol (TENORMIN) 50 MG tablet Take 50 mg by mouth daily.  ? atorvastatin (LIPITOR) 40 MG tablet Take 40 mg by mouth  daily at 6 PM.   ? Biotin 10 MG CAPS Take 10 mg by mouth daily.  ? bismuth subsalicylate (PEPTO BISMOL) 262 MG/15ML suspension Take 30 mLs by mouth every 6 (six) hours as needed for indigestion.  ? cholecalciferol (VITAMIN D3) 25 MCG (1000 UT) tablet Take 1,000 Units by mouth daily.  ? clopidogrel (PLAVIX) 75 MG tablet Take 75 mg by mouth every evening.   ? COVID-19 mRNA vaccine, Moderna, 100 MCG/0.5ML injection Inject into the muscle.  ? diazepam (VALIUM) 5 MG tablet Take 2.5-5 mg by mouth every 8 (eight) hours as needed (vertigo).  ? finasteride (PROSCAR) 5 MG tablet Take 5 mg by mouth daily.  ? hydrochlorothiazide 25 MG tablet Take 25 mg by mouth daily.  ? HYDROcodone bit-homatropine (HYCODAN) 5-1.5 MG/5ML syrup Take 5 mLs by mouth every 6 (six) hours as needed for cough.  ? iron polysaccharides (NIFEREX) 150 MG capsule Take 150 mg by mouth 2 (two) times daily.  ? losartan (COZAAR) 50 MG tablet Take 50 mg by mouth every evening.   ? Magnesium 250 MG TABS Take 250 mg by mouth at bedtime.  ? Misc Natural Products (GLUCOSAMINE CHONDROITIN TRIPLE) TABS Take 2 tablets by mouth daily.   ? Multiple Vitamin (MULTIVITAMIN WITH MINERALS) TABS tablet Take 1 tablet by mouth daily.  ? pantoprazole (PROTONIX) 40 MG tablet TAKE 1 TABLET BY MOUTH TWICE A DAY  ? potassium chloride SA (K-DUR,KLOR-CON) 20 MEQ tablet Take 20 mEq by mouth every evening.   ? tamsulosin (FLOMAX) 0.4 MG CAPS capsule Take 0.8 mg by mouth at bedtime.  ? traMADol (ULTRAM) 50 MG tablet Take 50 mg by mouth every 6 (six) hours as needed for severe pain.  ? [DISCONTINUED] cilostazol (PLETAL) 50 MG tablet Take 1 tablet (50 mg total) by mouth 2 (two) times daily.  ? ?Current Facility-Administered Medications for the 12/06/21 encounter (Office Visit) with Nathan Harp, MD  ?Medication  ? sodium chloride flush (NS) 0.9 % injection 3 mL  ?  ? ?Allergies  ?Allergen Reactions  ? Lisinopril Cough  ? Niaspan [Niacin Er] Rash  ? ? ?Social History  ? ?Socioeconomic  History  ? Marital status: Married  ?  Spouse name: Nathan Santiago  ? Number of children: Not on file  ? Years of education: BA  ? Highest education level: Not on file  ?Occupational History  ? Occupation: Retired  ?Tobacco Use  ? Smoking status: Former  ?  Packs/day: 2.00  ?  Years: 20.00  ?  Pack years: 40.00  ?  Types: Cigarettes  ?  Quit date: 07/31/1968  ?  Years since quitting: 53.3  ? Smokeless tobacco: Never  ?Vaping Use  ? Vaping Use: Never used  ?Substance and Sexual Activity  ? Alcohol use: Yes  ?  Alcohol/week: 4.0 standard drinks  ?  Types: 4 Glasses of wine per week  ? Drug use: No  ? Sexual activity: Not Currently  ?Other Topics Concern  ? Not on file  ?Social History Narrative  ? Lives with wife  ? Caffeine use: Decaf coffee, tea  ?  Left handed  ? ?Social Determinants of Health  ? ?Financial Resource Strain: Not on file  ?Food Insecurity: Not on file  ?Transportation Needs: Not on file  ?Physical Activity: Not on file  ?Stress: Not on file  ?Social Connections: Not on file  ?Intimate Partner Violence: Not on file  ?  ? ?Review of Systems: ?General: negative for chills, fever, night sweats or weight changes.  ?Cardiovascular: negative for chest pain, dyspnea on exertion, edema, orthopnea, palpitations, paroxysmal nocturnal dyspnea or shortness of breath ?Dermatological: negative for rash ?Respiratory: negative for cough or wheezing ?Urologic: negative for hematuria ?Abdominal: negative for nausea, vomiting, diarrhea, bright red blood per rectum, melena, or hematemesis ?Neurologic: negative for visual changes, syncope, or dizziness ?All other systems reviewed and are otherwise negative except as noted above. ? ? ? ?Blood pressure (!) 138/50, pulse 78, height '5\' 4"'$  (1.626 m), weight 173 lb (78.5 kg).  ?General appearance: alert and no distress ?Neck: no adenopathy, no JVD, supple, symmetrical, trachea midline, thyroid not enlarged, symmetric, no tenderness/mass/nodules, and left carotid bruit ?Lungs: clear to  auscultation bilaterally ?Heart: regular rate and rhythm, S1, S2 normal, no murmur, click, rub or gallop ?Extremities: extremities normal, atraumatic, no cyanosis or edema ?Pulses: Diminished pedal pulses ?Skin

## 2021-12-06 NOTE — Assessment & Plan Note (Signed)
History of hyperlipidemia on statin therapy with lipid profile performed 10/20/2020 revealing total cholesterol 151, LDL of 84 and HDL 47. ?

## 2021-12-20 ENCOUNTER — Ambulatory Visit (INDEPENDENT_AMBULATORY_CARE_PROVIDER_SITE_OTHER): Payer: Medicare Other | Admitting: Gastroenterology

## 2021-12-20 ENCOUNTER — Encounter: Payer: Self-pay | Admitting: Gastroenterology

## 2021-12-20 ENCOUNTER — Telehealth: Payer: Self-pay

## 2021-12-20 VITALS — BP 142/60 | HR 76 | Ht 64.0 in | Wt 171.0 lb

## 2021-12-20 DIAGNOSIS — Z86010 Personal history of colonic polyps: Secondary | ICD-10-CM

## 2021-12-20 DIAGNOSIS — D509 Iron deficiency anemia, unspecified: Secondary | ICD-10-CM | POA: Diagnosis not present

## 2021-12-20 DIAGNOSIS — K219 Gastro-esophageal reflux disease without esophagitis: Secondary | ICD-10-CM | POA: Diagnosis not present

## 2021-12-20 DIAGNOSIS — Z7902 Long term (current) use of antithrombotics/antiplatelets: Secondary | ICD-10-CM | POA: Diagnosis not present

## 2021-12-20 MED ORDER — NA SULFATE-K SULFATE-MG SULF 17.5-3.13-1.6 GM/177ML PO SOLN: 1.0000 | Freq: Once | ORAL | 0 refills | Status: AC

## 2021-12-20 NOTE — Patient Instructions (Signed)
You have been scheduled for an endoscopy and colonoscopy. Please follow the written instructions given to you at your visit today. Please pick up your prep supplies at the pharmacy within the next 1-3 days. If you use inhalers (even only as needed), please bring them with you on the day of your procedure.  The [facility] providers would like to encourage you to use MYCHART to communicate with providers for non-urgent requests or questions.  Due to long hold times on the telephone, sending your provider a message by MYCHART may be a faster and more efficient way to get a response.  Please allow 48 business hours for a response.  Please remember that this is for non-urgent requests.   Due to recent changes in healthcare laws, you may see the results of your imaging and laboratory studies on MyChart before your provider has had a chance to review them.  We understand that in some cases there may be results that are confusing or concerning to you. Not all laboratory results come back in the same time frame and the provider may be waiting for multiple results in order to interpret others.  Please give us 48 hours in order for your provider to thoroughly review all the results before contacting the office for clarification of your results.   Thank you for choosing me and [facility].  Malcolm T. Stark, Jr., MD., FACG  

## 2021-12-20 NOTE — Telephone Encounter (Signed)
   Primary Cardiologist: [name], MD  Chart reviewed as part of pre-operative protocol coverage. Given past medical history and time since last visit, based on ACC/AHA guidelines, Nathan Santiago would be at acceptable risk for the planned procedure without further cardiovascular testing.   Per Dr. Gwenlyn Found, patient may hold Plavix for 5 days for upcoming EGD/colonoscopy.   I will route this recommendation to the requesting party via Epic fax function and remove from pre-op pool.  Please call with questions.  Emmaline Life, NP-C    12/20/2021, 5:03 PM [place] [address], [address] Office [phone] Fax [phone]

## 2021-12-20 NOTE — Progress Notes (Addendum)
Assessment    New iron deficiency anemia. R/O colorectal neoplasms, AVM, ulcer, etc.  Personal history of adenomatous colon polyps, no longer in surveillance GERD with LRP Gastroparesis  CAD with stents AAA with stent, PVD, S/P bilateral carotid endarterectomies   Recommendations   Continue Niferex 150 mg bid and hold 5 days prior to procedures. Request recent CBC and Fe studies from Dr. Baldwin Crown office.  Schedule colonoscopy, EGD. The risks (including bleeding, perforation, infection, missed lesions, medication reactions and possible hospitalization or surgery if complications occur), benefits, and alternatives to colonoscopy with possible biopsy and possible polypectomy were discussed with the patient and they consent to proceed.  The risks (including bleeding, perforation, infection, missed lesions, medication reactions and possible hospitalization or surgery if complications occur), benefits, and alternatives to endoscopy with possible biopsy and possible dilation were discussed with the patient and they consent to proceed.   Hold Plavix 5 days before procedure - will instruct when and how to resume after procedure. Low but real risk of cardiovascular event such as heart attack, stroke, embolism, thrombosis or ischemia/infarct of other organs off Plavix explained and need to seek urgent help if this occurs. The patient consents to proceed. Will communicate by phone or EMR with patient's prescribing provider to confirm that holding Plavix is reasonable in this case.   Continue pantoprazole 40 mg po bid and follow antireflux measures    HPI   Chief complaint: anemia  Patient profile:  Nathan Santiago is a 88 y.o. male referred by [name], MD for a new microcytic anemia. He is accompanied by his wife. A new microcytic anemia was noted in April 2023 with Hgb=9.1, MCV=76.8. Hgb was 13.7 in March 2022. Ferritin=9.1, iron sat=8%, iron=28, TIBC=339. Stool dark and looser since  starting iron. No other GI complaints. Denies weight loss, abdominal pain, constipation, diarrhea, change in stool caliber, melena, hematochezia, nausea, vomiting, dysphagia, reflux symptoms, chest pain. See PMH below for additional history.    Previous Labs / Imaging::    Latest Ref Rng & Units 09/26/2021    8:08 AM 01/06/2021    6:01 PM 01/05/2019    3:50 AM  CBC  WBC 4.0 - 10.5 K/uL 9.3   8.2   13.3    Hemoglobin 13.0 - 17.0 g/dL 9.9   14.1   10.7    Hematocrit 39.0 - 52.0 % 31.7   41.5   35.3    Platelets 150 - 400 K/uL 302   220   205      Lab Results  Component Value Date   LIPASE 17 01/06/2021      Latest Ref Rng & Units 09/26/2021    8:08 AM 01/06/2021    6:01 PM 01/06/2019    8:53 AM  CMP  Glucose 70 - 99 mg/dL 111   89   136    BUN 8 - 23 mg/dL '20   17   19    '$ Creatinine 0.61 - 1.24 mg/dL 1.07   1.10   0.91    Sodium 135 - 145 mmol/L 140   143   140    Potassium 3.5 - 5.1 mmol/L 3.7   3.4   3.4    Chloride 98 - 111 mmol/L 104   105   105    CO2 22 - 32 mmol/L '28   26   28    '$ Calcium 8.9 - 10.3 mg/dL 9.3   10.0   8.3    Total Protein 6.5 -  8.1 g/dL  7.7     Total Bilirubin 0.3 - 1.2 mg/dL  0.6     Alkaline Phos 38 - 126 U/L  72     AST 15 - 41 U/L  20     ALT 0 - 44 U/L  21        Previous GI evaluation    Endoscopies:  Colonoscopy Aug 2010 two small hyperplastic polyps otherwise normal EGD Jan 2004  Imaging:     Past Medical History:  Diagnosis Date   AAA (abdominal aortic aneurysm) ([place])    asymptomatic   AAA (abdominal aortic aneurysm) ([place]) 2010   peripheral  angiogram-- bilateral SFA DISEASE  and 60 to 70% infrarenaal abd. aortic stenosis with 15 -mm gradient   Adenomatous colon polyp 11/2003   CAD (coronary artery disease)    Gait abnormality 02/13/2017   Gastroparesis    pt denies   GERD (gastroesophageal reflux disease)    w/ LPR   History of kidney stones    x2   Hyperlipidemia    Hypertension    IDA (iron deficiency anemia)     Peripheral arterial disease ([place])    RCEA  by Dr Lemar Livings   PUD (peptic ulcer disease)    pt unaware   Vertigo    Past Surgical History:  Procedure Laterality Date   ABDOMINAL AORTOGRAM W/LOWER EXTREMITY Bilateral 08/05/2018   Procedure: ABDOMINAL AORTOGRAM W/LOWER EXTREMITY;  Surgeon: Lorretta Harp, MD;  Location: [facility];  Service: Cardiovascular;  Laterality: Bilateral;   ABDOMINAL AORTOGRAM W/LOWER EXTREMITY N/A 09/26/2021   Procedure: ABDOMINAL AORTOGRAM W/LOWER EXTREMITY;  Surgeon: Lorretta Harp, MD;  Location: [facility];  Service: Cardiovascular;  Laterality: N/A;   AORTOGRAM  04/24/2016    Abdominal aortogram, bilateral iliac angiogram, bifemoral runoff   CARDIAC CATHETERIZATION  05/12/2005   RCA   carotid doppler  01/24/2013   RICA endarterectomy,left CCA 0-49%; left bulb and prox ICA 50-69%; bilaateral subclavian < 50%   CAROTID ENDARTERECTOMY  11/01/2010   CATARACT EXTRACTION     COLONOSCOPY     CORONARY ANGIOPLASTY  05/18/2005   2 STENTS distal RCA AND PROXIMAL-MID RCA   5 total stents per pt.   CYSTOSCOPY/URETEROSCOPY/HOLMIUM LASER/STENT PLACEMENT Left 01/11/2018   Procedure: LEFT URETEROSCOPY/HOLMIUM LASER/STENT PLACEMENT;  Surgeon: Ardis Hughs, MD;  Location: [facility];  Service: Urology;  Laterality: Left;   DOPPLER ECHOCARDIOGRAPHY  02/07/2012   EF 55%,SHOWED NO ISCHEMIA    HERNIA REPAIR     umbilical   lower arterial  doppler  02/04/2013   aotra 1.5 x 1.5 cm; distal abd aorta 70-99%,proximal common iliac arteries -very stenotic with increased velocities>50%,may be falsely elevated as a result of residual plaque from the distal aorta stenosis   lower extremity doppler  June 18 ,2013   ABI'S ABNORMAL, RABI was 0.88 and LABI 0.75 ,with 3-vessel  run off   NM MYOVIEW LTD  MAY 23,2011   showed no significant ischemia;   NM MYOVIEW LTD  04/22/2008   ef 77%,exercise capcity 6 METS ,exaggerated blood pressure response to exercise    PERIPHERAL VASCULAR CATHETERIZATION N/A 04/24/2016   Procedure: Lower Extremity Angiography;  Surgeon: Lorretta Harp, MD;  Location: [facility];  Service: Cardiovascular;  Laterality: N/A;   PERIPHERAL VASCULAR CATHETERIZATION  04/24/2016   Procedure: Peripheral Vascular Intervention;  Surgeon: Lorretta Harp, MD;  Location: [facility];  Service: Cardiovascular;;  Aorta   retrograde central aortic  catheterization  05/19/2005   cutting balloon atherectomy, c-circ stenosis with DES STENTING CYPHER   TONSILLECTOMY     TOTAL KNEE ARTHROPLASTY Left 01/03/2019   Procedure: TOTAL KNEE ARTHROPLASTY;  Surgeon: Netta Cedars, MD;  Location: [facility];  Service: Orthopedics;  Laterality: Left;  with IS block   TRANSURETHRAL RESECTION OF PROSTATE N/A 09/08/2016   Procedure: TRANSURETHRAL RESECTION OF THE PROSTATE (TURP);  Surgeon: Ardis Hughs, MD;  Location: [facility];  Service: Urology;  Laterality: N/A;   TRANSURETHRAL RESECTION OF PROSTATE     10-10-17  Dr. Louis Meckel   TRANSURETHRAL RESECTION OF PROSTATE N/A 10/10/2017   Procedure: TRANSURETHRAL RESECTION OF THE PROSTATE (TURP);  Surgeon: Ardis Hughs, MD;  Location: [facility];  Service: Urology;  Laterality: N/A;   VASECTOMY     Family History  Problem Relation Age of Onset   Ovarian cancer Sister    Heart disease Father    Brain cancer Brother        Tumor   Social History   Tobacco Use   Smoking status: Former    Packs/day: 2.00    Years: 20.00    Pack years: 40.00    Types: Cigarettes    Quit date: 07/31/1968    Years since quitting: 53.4   Smokeless tobacco: Never  Vaping Use   Vaping Use: Never used  Substance Use Topics   Alcohol use: Yes    Alcohol/week: 4.0 standard drinks    Types: 4 Glasses of wine per week   Drug use: No   Current Outpatient Medications  Medication Sig Dispense Refill   acetaminophen (TYLENOL) 500 MG tablet Take 1 tablet (500 mg total) by mouth 2 (two) times daily as needed. 30 tablet 2    amLODipine (NORVASC) 5 MG tablet Take 5 mg by mouth daily.     Apoaequorin (PREVAGEN PO) Take 1 tablet by mouth daily.     aspirin EC 81 MG tablet Take 1 tablet (81 mg total) by mouth 2 (two) times a day. (Patient taking differently: Take 81 mg by mouth daily.) 60 tablet 0   atenolol (TENORMIN) 50 MG tablet Take 50 mg by mouth daily.     atorvastatin (LIPITOR) 40 MG tablet Take 40 mg by mouth daily at 6 PM.      Biotin 10 MG CAPS Take 10 mg by mouth daily.     bismuth subsalicylate (PEPTO BISMOL) 262 MG/15ML suspension Take 30 mLs by mouth every 6 (six) hours as needed for indigestion.     cholecalciferol (VITAMIN D3) 25 MCG (1000 UT) tablet Take 1,000 Units by mouth daily.     clopidogrel (PLAVIX) 75 MG tablet Take 75 mg by mouth every evening.      COVID-19 mRNA vaccine, Moderna, 100 MCG/0.5ML injection Inject into the muscle. 0.25 mL 0   diazepam (VALIUM) 5 MG tablet Take 2.5-5 mg by mouth every 8 (eight) hours as needed (vertigo).     hydrochlorothiazide 25 MG tablet Take 25 mg by mouth daily.     HYDROcodone bit-homatropine (HYCODAN) 5-1.5 MG/5ML syrup Take 5 mLs by mouth every 6 (six) hours as needed for cough.     iron polysaccharides (NIFEREX) 150 MG capsule Take 150 mg by mouth 2 (two) times daily.     losartan (COZAAR) 50 MG tablet Take 50 mg by mouth every evening.      Magnesium 250 MG TABS Take 250 mg by mouth at bedtime.     Misc Natural Products (GLUCOSAMINE CHONDROITIN TRIPLE) TABS Take  2 tablets by mouth daily.      Multiple Vitamin (MULTIVITAMIN WITH MINERALS) TABS tablet Take 1 tablet by mouth daily.     pantoprazole (PROTONIX) 40 MG tablet TAKE 1 TABLET BY MOUTH TWICE A DAY 180 tablet 0   potassium chloride SA (K-DUR,KLOR-CON) 20 MEQ tablet Take 20 mEq by mouth every evening.      traMADol (ULTRAM) 50 MG tablet Take 50 mg by mouth every 6 (six) hours as needed for severe pain.     Current Facility-Administered Medications  Medication Dose Route Frequency Provider Last  Rate Last Admin   sodium chloride flush (NS) 0.9 % injection 3 mL  3 mL Intravenous Q12H Lorretta Harp, MD       Allergies  Allergen Reactions   Lisinopril Cough   Niaspan [Niacin Er] Rash    Review of Systems: All other systems reviewed and negative except where noted in HPI.    Physical Exam    Wt Readings from Last 3 Encounters:  12/20/21 171 lb (77.6 kg)  12/06/21 173 lb (78.5 kg)  09/29/21 171 lb (77.6 kg)    BP (!) 142/60   Pulse 76   Ht '5\' 4"'$  (1.626 m)   Wt 171 lb (77.6 kg)   BMI 29.35 kg/m  Constitutional:  Generally well appearing elderly male in no acute distress, using a walker. Psychiatric: Pleasant. Normal mood and affect. Behavior is normal. EENT: Pupils normal.  Conjunctivae are normal. No scleral icterus. Neck supple.  Cardiovascular: Normal rate, regular rhythm. No edema Pulmonary/chest: Effort normal and breath sounds normal. No wheezing, rales or rhonchi. Abdominal: Soft, nondistended, nontender. Bowel sounds active throughout. There are no masses palpable. No hepatomegaly. Rectal: Not done Neurological: Alert and oriented to person place and time. Unsteady gait.  Skin: Skin is warm and dry. No rashes noted.  Lucio Edward, MD   cc:  Referring Provider [name], MD

## 2021-12-20 NOTE — Telephone Encounter (Signed)
[facility] Pre-operative Risk Assessment     Request for surgical clearance:     Endoscopy Procedure  What type of surgery is being performed?     Endoscopy/colonoscopy  When is this surgery scheduled?     02/06/22  What type of clearance is required ?   Pharmacy  Are there any medications that need to be held prior to surgery and how long? Plavix x 5 days  Practice name and name of physician performing surgery?      [facility]  What is your office phone and fax number?      Phone- [phone]  [phone]  Anesthesia type (None, local, MAC, general) ?       MAC

## 2021-12-20 NOTE — Telephone Encounter (Signed)
Dr. Gwenlyn Found, patient was recently seen by you on 12/06/21. He has a history of distal abdominal aortic stent and high-grade calcified left greater than right common femoral artery stenosis as well as focal bilateral mid SFA disease.  We referred him to Dr. Scot Dock who performed endarterectomies in the past.  Dr. Scot Dock did not think he was a good candidate for common femoral endarterectomy due to age and comorbidities. Hx of CAD s/p stenting of proximal and mid as well as distal RCA using overlapping stents as well as to his AV groove circumflex with a known occluded OM branch 2006.   Request to hold Plavix x 5 days for upcoming EGD/colonoscopy.  Please forward your response to p cv div preop.  Thank you, Emmaline Life, NP-C    12/20/2021, 3:57 PM [place] [address] Office [phone] Fax [phone]

## 2021-12-21 NOTE — Telephone Encounter (Signed)
Informed patient he can hold Plavix 5 days prior to his procedure. Patient verbalized understanding. 

## 2022-01-10 ENCOUNTER — Encounter: Payer: Medicare Other | Admitting: Gastroenterology

## 2022-01-30 ENCOUNTER — Encounter: Payer: Self-pay | Admitting: Gastroenterology

## 2022-02-03 ENCOUNTER — Telehealth: Payer: Self-pay | Admitting: Gastroenterology

## 2022-02-03 NOTE — Telephone Encounter (Signed)
Patient should keep his plans for his colonoscopy on Monday.  Just do not take any more iron until after the procedure.  Thanks

## 2022-02-03 NOTE — Telephone Encounter (Signed)
Returned call and spoke to pt.  Pt stated that he ate popcorn on yesterday (02/02/22) and has continued taking his iron supplements.  Pt stated he took an iron pill today (02/03/22).  Pt wants to know if he needs to reschedule his procedure that's scheduled for Monday (02/06/22).  Please advise.

## 2022-02-03 NOTE — Telephone Encounter (Signed)
Patient notified to stop iron today and that he should keep his procedure as scheduled for Monday.  He verbalized understanding.

## 2022-02-03 NOTE — Telephone Encounter (Signed)
Spoke to [employer], Levada Dy advised to send message to doc of the day since Dr. Fuller Plan is not in the office today.  Informed pt that message would be forwarded to the doctor on call and that someone would advise him on whether or not to start his prep on Sunday.

## 2022-02-03 NOTE — Telephone Encounter (Signed)
Per our conversation.  Thanks

## 2022-02-06 ENCOUNTER — Ambulatory Visit (AMBULATORY_SURGERY_CENTER): Payer: Medicare Other | Admitting: Gastroenterology

## 2022-02-06 ENCOUNTER — Encounter: Payer: Self-pay | Admitting: Gastroenterology

## 2022-02-06 VITALS — BP 177/52 | HR 89 | Temp 98.0°F | Resp 25 | Ht 64.0 in | Wt 171.0 lb

## 2022-02-06 DIAGNOSIS — D123 Benign neoplasm of transverse colon: Secondary | ICD-10-CM

## 2022-02-06 DIAGNOSIS — D120 Benign neoplasm of cecum: Secondary | ICD-10-CM

## 2022-02-06 DIAGNOSIS — K5730 Diverticulosis of large intestine without perforation or abscess without bleeding: Secondary | ICD-10-CM

## 2022-02-06 DIAGNOSIS — K5520 Angiodysplasia of colon without hemorrhage: Secondary | ICD-10-CM

## 2022-02-06 DIAGNOSIS — D125 Benign neoplasm of sigmoid colon: Secondary | ICD-10-CM | POA: Diagnosis not present

## 2022-02-06 DIAGNOSIS — D122 Benign neoplasm of ascending colon: Secondary | ICD-10-CM | POA: Diagnosis not present

## 2022-02-06 DIAGNOSIS — D509 Iron deficiency anemia, unspecified: Secondary | ICD-10-CM | POA: Diagnosis not present

## 2022-02-06 DIAGNOSIS — K317 Polyp of stomach and duodenum: Secondary | ICD-10-CM | POA: Diagnosis not present

## 2022-02-06 DIAGNOSIS — K219 Gastro-esophageal reflux disease without esophagitis: Secondary | ICD-10-CM

## 2022-02-06 DIAGNOSIS — Z86010 Personal history of colonic polyps: Secondary | ICD-10-CM

## 2022-02-06 MED ORDER — SODIUM CHLORIDE 0.9 % IV SOLN: 500.0000 mL | Freq: Once | INTRAVENOUS | Status: DC

## 2022-02-06 NOTE — Progress Notes (Signed)
History & Physical  Primary Care Physician:  [name], MD Primary Gastroenterologist: Lucio Edward, MD  CHIEF COMPLAINT:  IDA, GERD, Personal history of colon polyps   HPI: Nathan Santiago is a 88 y.o. male with unexplained iron deficiency anemia, GERD and a remote history of adenomatous colon polyps for colonoscopy and EGD.   Past Medical History:  Diagnosis Date   AAA (abdominal aortic aneurysm) ([place])    asymptomatic   AAA (abdominal aortic aneurysm) ([place]) 2010   peripheral  angiogram-- bilateral SFA DISEASE  and 60 to 70% infrarenaal abd. aortic stenosis with 15 -mm gradient   Adenomatous colon polyp 11/2003   CAD (coronary artery disease)    Gait abnormality 02/13/2017   Gastroparesis    pt denies   GERD (gastroesophageal reflux disease)    w/ LPR   History of kidney stones    x2   Hyperlipidemia    Hypertension    IDA (iron deficiency anemia)    Peripheral arterial disease ([place])    RCEA  by Dr Lemar Livings   PUD (peptic ulcer disease)    pt unaware   Vertigo     Past Surgical History:  Procedure Laterality Date   ABDOMINAL AORTOGRAM W/LOWER EXTREMITY Bilateral 08/05/2018   Procedure: ABDOMINAL AORTOGRAM W/LOWER EXTREMITY;  Surgeon: Lorretta Harp, MD;  Location: [facility];  Service: Cardiovascular;  Laterality: Bilateral;   ABDOMINAL AORTOGRAM W/LOWER EXTREMITY N/A 09/26/2021   Procedure: ABDOMINAL AORTOGRAM W/LOWER EXTREMITY;  Surgeon: Lorretta Harp, MD;  Location: [place] [facility] [facility];  Service: Cardiovascular;  Laterality: N/A;   AORTOGRAM  04/24/2016    Abdominal aortogram, bilateral iliac angiogram, bifemoral runoff   CARDIAC CATHETERIZATION  05/12/2005   RCA   carotid doppler  01/24/2013   RICA endarterectomy,left CCA 0-49%; left bulb and prox ICA 50-69%; bilaateral subclavian < 50%   CAROTID ENDARTERECTOMY  11/01/2010   CATARACT EXTRACTION     COLONOSCOPY     CORONARY ANGIOPLASTY  05/18/2005   2 STENTS distal RCA AND PROXIMAL-MID  RCA   5 total stents per pt.   CYSTOSCOPY/URETEROSCOPY/HOLMIUM LASER/STENT PLACEMENT Left 01/11/2018   Procedure: LEFT URETEROSCOPY/HOLMIUM LASER/STENT PLACEMENT;  Surgeon: Ardis Hughs, MD;  Location: [facility];  Service: Urology;  Laterality: Left;   DOPPLER ECHOCARDIOGRAPHY  02/07/2012   EF 55%,SHOWED NO ISCHEMIA    HERNIA REPAIR     umbilical   lower arterial  doppler  02/04/2013   aotra 1.5 x 1.5 cm; distal abd aorta 70-99%,proximal common iliac arteries -very stenotic with increased velocities>50%,may be falsely elevated as a result of residual plaque from the distal aorta stenosis   lower extremity doppler  June 18 ,2013   ABI'S ABNORMAL, RABI was 0.88 and LABI 0.75 ,with 3-vessel  run off   NM MYOVIEW LTD  MAY 23,2011   showed no significant ischemia;   NM MYOVIEW LTD  04/22/2008   ef 77%,exercise capcity 6 METS ,exaggerated blood pressure response to exercise   PERIPHERAL VASCULAR CATHETERIZATION N/A 04/24/2016   Procedure: Lower Extremity Angiography;  Surgeon: Lorretta Harp, MD;  Location: [facility];  Service: Cardiovascular;  Laterality: N/A;   PERIPHERAL VASCULAR CATHETERIZATION  04/24/2016   Procedure: Peripheral Vascular Intervention;  Surgeon: Lorretta Harp, MD;  Location: [facility];  Service: Cardiovascular;;  Aorta   retrograde central aortic catheterization  05/19/2005   cutting balloon atherectomy, c-circ stenosis with DES STENTING CYPHER   TONSILLECTOMY     TOTAL KNEE ARTHROPLASTY  Left 01/03/2019   Procedure: TOTAL KNEE ARTHROPLASTY;  Surgeon: Netta Cedars, MD;  Location: [facility];  Service: Orthopedics;  Laterality: Left;  with IS block   TRANSURETHRAL RESECTION OF PROSTATE N/A 09/08/2016   Procedure: TRANSURETHRAL RESECTION OF THE PROSTATE (TURP);  Surgeon: Ardis Hughs, MD;  Location: [facility];  Service: Urology;  Laterality: N/A;   TRANSURETHRAL RESECTION OF PROSTATE     10-10-17  Dr. Louis Meckel   TRANSURETHRAL RESECTION OF PROSTATE N/A 10/10/2017    Procedure: TRANSURETHRAL RESECTION OF THE PROSTATE (TURP);  Surgeon: Ardis Hughs, MD;  Location: [facility];  Service: Urology;  Laterality: N/A;   VASECTOMY      Prior to Admission medications   Medication Sig Start Date End Date Taking? Authorizing Provider  acetaminophen (TYLENOL) 500 MG tablet Take 1 tablet (500 mg total) by mouth 2 (two) times daily as needed. 01/13/19  Yes Wert, Margreta Journey, NP  amLODipine (NORVASC) 5 MG tablet Take 5 mg by mouth daily.   Yes [provider]  Apoaequorin (PREVAGEN PO) Take 1 tablet by mouth daily.   Yes [provider]  aspirin EC 81 MG tablet Take 1 tablet (81 mg total) by mouth 2 (two) times a day. Patient taking differently: Take 81 mg by mouth daily. 01/03/19  Yes Netta Cedars, MD  atenolol (TENORMIN) 50 MG tablet Take 50 mg by mouth daily.   Yes [provider]  atorvastatin (LIPITOR) 40 MG tablet Take 40 mg by mouth daily at 6 PM.  11/14/16  Yes [provider]  Biotin 10 MG CAPS Take 10 mg by mouth daily.   Yes [provider]  bismuth subsalicylate (PEPTO BISMOL) 262 MG/15ML suspension Take 30 mLs by mouth every 6 (six) hours as needed for indigestion.   Yes [provider]  cholecalciferol (VITAMIN D3) 25 MCG (1000 UT) tablet Take 1,000 Units by mouth daily.   Yes [provider]  hydrochlorothiazide 25 MG tablet Take 25 mg by mouth daily.   Yes [provider]  HYDROcodone bit-homatropine (HYCODAN) 5-1.5 MG/5ML syrup Take 5 mLs by mouth every 6 (six) hours as needed for cough. 11/29/20  Yes [provider]  losartan (COZAAR) 50 MG tablet Take 50 mg by mouth every evening.    Yes [provider]  Magnesium 250 MG TABS Take 250 mg by mouth at bedtime.   Yes [provider]  Misc Natural Products (GLUCOSAMINE CHONDROITIN TRIPLE) TABS Take 2 tablets by mouth daily.    Yes [provider]  Multiple Vitamin (MULTIVITAMIN WITH MINERALS) TABS  tablet Take 1 tablet by mouth daily.   Yes [provider]  pantoprazole (PROTONIX) 40 MG tablet TAKE 1 TABLET BY MOUTH TWICE A DAY 10/21/18  Yes Byrum, Rose Fillers, MD  potassium chloride SA (K-DUR,KLOR-CON) 20 MEQ tablet Take 20 mEq by mouth every evening.  07/16/13  Yes [provider]  clopidogrel (PLAVIX) 75 MG tablet Take 75 mg by mouth every evening.     [provider]  diazepam (VALIUM) 5 MG tablet Take 2.5-5 mg by mouth every 8 (eight) hours as needed (vertigo). 01/21/18   [provider]  iron polysaccharides (NIFEREX) 150 MG capsule Take 150 mg by mouth 2 (two) times daily.    [provider]  traMADol (ULTRAM) 50 MG tablet Take 50 mg by mouth every 6 (six) hours as needed for severe pain. 01/16/19   [provider]    Current Outpatient Medications  Medication Sig Dispense Refill  acetaminophen (TYLENOL) 500 MG tablet Take 1 tablet (500 mg total) by mouth 2 (two) times daily as needed. 30 tablet 2   amLODipine (NORVASC) 5 MG tablet Take 5 mg by mouth daily.     Apoaequorin (PREVAGEN PO) Take 1 tablet by mouth daily.     aspirin EC 81 MG tablet Take 1 tablet (81 mg total) by mouth 2 (two) times a day. (Patient taking differently: Take 81 mg by mouth daily.) 60 tablet 0   atenolol (TENORMIN) 50 MG tablet Take 50 mg by mouth daily.     atorvastatin (LIPITOR) 40 MG tablet Take 40 mg by mouth daily at 6 PM.      Biotin 10 MG CAPS Take 10 mg by mouth daily.     bismuth subsalicylate (PEPTO BISMOL) 262 MG/15ML suspension Take 30 mLs by mouth every 6 (six) hours as needed for indigestion.     cholecalciferol (VITAMIN D3) 25 MCG (1000 UT) tablet Take 1,000 Units by mouth daily.     hydrochlorothiazide 25 MG tablet Take 25 mg by mouth daily.     HYDROcodone bit-homatropine (HYCODAN) 5-1.5 MG/5ML syrup Take 5 mLs by mouth every 6 (six) hours as needed for cough.     losartan (COZAAR) 50 MG tablet Take 50 mg by mouth every evening.       Magnesium 250 MG TABS Take 250 mg by mouth at bedtime.     Misc Natural Products (GLUCOSAMINE CHONDROITIN TRIPLE) TABS Take 2 tablets by mouth daily.      Multiple Vitamin (MULTIVITAMIN WITH MINERALS) TABS tablet Take 1 tablet by mouth daily.     pantoprazole (PROTONIX) 40 MG tablet TAKE 1 TABLET BY MOUTH TWICE A DAY 180 tablet 0   potassium chloride SA (K-DUR,KLOR-CON) 20 MEQ tablet Take 20 mEq by mouth every evening.      clopidogrel (PLAVIX) 75 MG tablet Take 75 mg by mouth every evening.      diazepam (VALIUM) 5 MG tablet Take 2.5-5 mg by mouth every 8 (eight) hours as needed (vertigo).     iron polysaccharides (NIFEREX) 150 MG capsule Take 150 mg by mouth 2 (two) times daily.     traMADol (ULTRAM) 50 MG tablet Take 50 mg by mouth every 6 (six) hours as needed for severe pain.     Current Facility-Administered Medications  Medication Dose Route Frequency Provider Last Rate Last Admin   0.9 %  sodium chloride infusion  500 mL Intravenous Once Ladene Artist, MD        Allergies as of 02/06/2022 - Review Complete 02/06/2022  Allergen Reaction Noted   Lisinopril Cough    Niaspan [niacin er] Rash 04/11/2011    Family History  Problem Relation Age of Onset   Ovarian cancer Sister    Heart disease Father    Brain cancer Brother        Tumor    Social History   Socioeconomic History   Marital status: Married    Spouse name: [name]   Number of children: 2   Years of education: BA   Highest education level: Not on file  Occupational History   Occupation: Retired  Tobacco Use   Smoking status: Former    Packs/day: 2.00    Years: 20.00    Total pack years: 40.00    Types: Cigarettes    Quit date: 07/31/1968    Years since quitting: 53.5   Smokeless tobacco: Never  Vaping Use   Vaping Use: Never used  Substance and  Sexual Activity   Alcohol use: Yes    Alcohol/week: 4.0 standard drinks of alcohol    Types: 4 Glasses of wine per week   Drug use: Never   Sexual activity:  Not Currently  Other Topics Concern   Not on file  Social History Narrative   Lives with wife   Caffeine use: Decaf coffee, tea   Left handed   Social Determinants of Health   Financial Resource Strain: Not on file  Food Insecurity: Not on file  Transportation Needs: Not on file  Physical Activity: Not on file  Stress: Not on file  Social Connections: Not on file  Intimate Partner Violence: Not on file    Review of Systems:  All systems reviewed were negative except where noted in HPI.   Physical Exam: General:  Alert, well-developed, in NAD Head:  Normocephalic and atraumatic. Eyes:  Sclera clear, no icterus.   Conjunctiva pink. Ears:  Normal auditory acuity. Mouth:  No deformity or lesions.  Neck:  Supple; no masses . Lungs:  Clear throughout to auscultation.   No wheezes, crackles, or rhonchi. No acute distress. Heart:  Regular rate and rhythm; no murmurs. Abdomen:  Soft, nondistended, nontender. No masses, hepatomegaly. No obvious masses.  Normal bowel .    Rectal:  Deferred   Msk:  Symmetrical without gross deformities.. Pulses:  Normal pulses noted. Extremities:  Without edema. Neurologic:  Alert and  oriented x4;  grossly normal neurologically. Skin:  Intact without significant lesions or rashes. Cervical Nodes:  No significant cervical adenopathy. Psych:  Alert and cooperative. Normal mood and affect.   Impression / Plan:   Unexplained iron deficiency anemia, GERD and a remote history of adenomatous colon polyps for colonoscopy and EGD.  Pricilla Riffle. Fuller Plan  02/06/2022, 2:59 PM See Shea Evans, [facility], to contact our on call provider

## 2022-02-06 NOTE — Patient Instructions (Addendum)
HANDOUTS ON POLYPS AND DIVERTICULOSIS GIVEN.   NO ASPIRIN, IBUPROFEN, NAPROXEN OR OTHER NON STEROIDAL ANTI-INFLAMMATORY DRUGS FOR 1 WEEK AFTER POLYP REMOVAL.   NO IRON FOR 1 WEEK.   RESUME PLAVIX (CLOPIDOGREL) AT PRIOR DOSE IN 5 DAYS. REFER TO MANAGING PHYSICIAN FOR FURTHER ADJUSTMENT OF THERAPY.      YOU HAD AN ENDOSCOPIC PROCEDURE TODAY AT [facility]:   Refer to the procedure report that was given to you for any specific questions about what was found during the examination.  If the procedure report does not answer your questions, please call your gastroenterologist to clarify.  If you requested that your care partner not be given the details of your procedure findings, then the procedure report has been included in a sealed envelope for you to review at your convenience later.  YOU SHOULD EXPECT: Some feelings of bloating in the abdomen. Passage of more gas than usual.  Walking can help get rid of the air that was put into your GI tract during the procedure and reduce the bloating. If you had a lower endoscopy (such as a colonoscopy or flexible sigmoidoscopy) you may notice spotting of blood in your stool or on the toilet paper. If you underwent a bowel prep for your procedure, you may not have a normal bowel movement for a few days.  Please Note:  You might notice some irritation and congestion in your nose or some drainage.  This is from the oxygen used during your procedure.  There is no need for concern and it should clear up in a day or so.  SYMPTOMS TO REPORT IMMEDIATELY:  Following lower endoscopy (colonoscopy or flexible sigmoidoscopy):  Excessive amounts of blood in the stool  Significant tenderness or worsening of abdominal pains  Swelling of the abdomen that is new, acute  Fever of 100F or higher  Following upper endoscopy (EGD)  Vomiting of blood or coffee ground material  New chest pain or pain under the shoulder blades  Painful or persistently  difficult swallowing  New shortness of breath  Fever of 100F or higher  Black, tarry-looking stools  For urgent or emergent issues, a gastroenterologist can be reached at any hour by calling [phone]. Do not use MyChart messaging for urgent concerns.    DIET:  We do recommend a small meal at first, but then you may proceed to your regular diet.  Drink plenty of fluids but you should avoid alcoholic beverages for 24 hours.  ACTIVITY:  You should plan to take it easy for the rest of today and you should NOT DRIVE or use heavy machinery until tomorrow (because of the sedation medicines used during the test).    FOLLOW UP: Our staff will call the number listed on your records the next business day following your procedure.  We will call around 7:15- 8:00 am to check on you and address any questions or concerns that you may have regarding the information given to you following your procedure. If we do not reach you, we will leave a message.  If you develop any symptoms (ie: fever, flu-like symptoms, shortness of breath, cough etc.) before then, please call [phone].  If you test positive for Covid 19 in the 2 weeks post procedure, please call and report this information to [place].    If any biopsies were taken you will be contacted by phone or by letter within the next 1-3 weeks.  Please call us at [phone] if you have not heard  about the biopsies in 3 weeks.    SIGNATURES/CONFIDENTIALITY: You and/or your care partner have signed paperwork which will be entered into your electronic medical record.  These signatures attest to the fact that that the information above on your After Visit Summary has been reviewed and is understood.  Full responsibility of the confidentiality of this discharge information lies with you and/or your care-partner.

## 2022-02-06 NOTE — Op Note (Signed)
[facility] Patient Name: Nathan Santiago Procedure Date: 02/06/2022 3:03 PM MRN: [id number] Endoscopist: Ladene Artist , MD Age: 88 Referring MD:  Date of Birth: [birth year] Gender: Male Account #: [id number] Procedure:                Colonoscopy Indications:              Iron deficiency anemia Medicines:                Monitored Anesthesia Care Procedure:                Pre-Anesthesia Assessment:                           - Prior to the procedure, a History and Physical                            was performed, and patient medications and                            allergies were reviewed. The patient's tolerance of                            previous anesthesia was also reviewed. The risks                            and benefits of the procedure and the sedation                            options and risks were discussed with the patient.                            All questions were answered, and informed consent                            was obtained. Prior Anticoagulants: The patient has                            taken Plavix (clopidogrel), last dose was 5 days                            prior to procedure. ASA Grade Assessment: III - A                            patient with severe systemic disease. After                            reviewing the risks and benefits, the patient was                            deemed in satisfactory condition to undergo the                            procedure.  After obtaining informed consent, the colonoscope                            was passed under direct vision. Throughout the                            procedure, the patient's blood pressure, pulse, and                            oxygen saturations were monitored continuously. The                            CF [id number] #[id number] was introduced through the anus                            and advanced to the the cecum, identified by                             appendiceal orifice and ileocecal valve. The                            ileocecal valve, appendiceal orifice, and rectum                            were photographed. The quality of the bowel                            preparation was good. The colonoscopy was performed                            without difficulty. The patient tolerated the                            procedure well. Scope In: 3:09:56 PM Scope Out: 3:33:37 PM Scope Withdrawal Time: 0 hours 21 minutes 31 seconds  Total Procedure Duration: 0 hours 23 minutes 41 seconds  Findings:                 The perianal and digital rectal examinations were                            normal.                           A 10 mm polyp was found in the proximal transverse                            colon. The polyp was sessile. The polyp was removed                            with a cold snare. Resection and retrieval were                            complete. Persistent oozing noted. For hemostasis,  two hemostatic clips were successfully placed (MR                            conditional). There was no bleeding at the end of                            the maneuver.                           Four sessile polyps were found in the sigmoid                            colon, transverse colon, ascending colon and                            ileocecal valve. The polyps were 5 to 7 mm in size.                            These polyps were removed with a cold snare.                            Resection and retrieval were complete.                           A few medium-sized localized angiodysplastic                            lesions without bleeding were found in the                            ascending colon, in the cecum and at the ileocecal                            valve.                           A few small-mouthed diverticula were found in the                            sigmoid colon, descending colon and  transverse                            colon. There was no evidence of diverticular                            bleeding.                           The exam was otherwise without abnormality on                            direct and retroflexion views. Complications:            No immediate complications. Estimated blood loss:  None. Estimated Blood Loss:     Estimated blood loss: none. Impression:               - One 10 mm polyp in the proximal transverse colon,                            removed with a cold snare. Resected and retrieved.                            Clips (MR conditional) were placed.                           - Four 5 to 7 mm polyps in the sigmoid colon, in                            the transverse colon, in the ascending colon and at                            the ileocecal valve, removed with a cold snare.                            Resected and retrieved.                           - A few non-bleeding colonic angiodysplastic                            lesions.                           - Mild diverticulosis in the sigmoid colon, in the                            descending colon and in the transverse colon.                           - The examination was otherwise normal on direct                            and retroflexion views. Recommendation:           - See EGD report regarding Plavix (clopidogrel).                            Refer to managing physician for further adjustment                            of therapy.                           - Patient has a contact number available for                            emergencies. The signs and symptoms of potential  delayed complications were discussed with the                            patient. Return to normal activities tomorrow.                            Written discharge instructions were provided to the                            patient.                            - Resume previous diet.                           - Continue present medications.                           - Await pathology results.                           - No repeat colonoscopy due to age. Ladene Artist, MD 02/06/2022 4:15:39 PM This report has been signed electronically.

## 2022-02-06 NOTE — Progress Notes (Signed)
Report to PACU, RN, vss, BBS= Clear.  

## 2022-02-06 NOTE — Op Note (Signed)
[facility] Patient Name: Nathan Santiago Procedure Date: 02/06/2022 3:02 PM MRN: [id number] Endoscopist: Ladene Artist , MD Age: 88 Referring MD:  Date of Birth: [birth year] Gender: Male Account #: [id number] Procedure:                Upper GI endoscopy Indications:              Iron deficiency anemia Medicines:                Monitored Anesthesia Care Procedure:                Pre-Anesthesia Assessment:                           - Prior to the procedure, a History and Physical                            was performed, and patient medications and                            allergies were reviewed. The patient's tolerance of                            previous anesthesia was also reviewed. The risks                            and benefits of the procedure and the sedation                            options and risks were discussed with the patient.                            All questions were answered, and informed consent                            was obtained. Prior Anticoagulants: The patient has                            taken Plavix (clopidogrel), last dose was 5 days                            prior to procedure. ASA Grade Assessment: III - A                            patient with severe systemic disease. After                            reviewing the risks and benefits, the patient was                            deemed in satisfactory condition to undergo the                            procedure.  After obtaining informed consent, the endoscope was                            passed under direct vision. Throughout the                            procedure, the patient's blood pressure, pulse, and                            oxygen saturations were monitored continuously. The                            GIF [id number] #[id number] was introduced through the                            mouth, and advanced to the second part of duodenum.                             The upper GI endoscopy was somewhat difficult due                            to excessive bleeding. The patient tolerated the                            procedure well. Scope In: Scope Out: Findings:                 The examined esophagus was normal.                           Multiple 4 to 7 mm sessile polyps with no bleeding                            and no stigmata of recent bleeding were found in                            the gastric fundus and in the gastric body.                           A single 7 mm sessile polyp with mild bleeding and                            stigmata of recent bleeding was found in the                            cardia. The polyp was removed with a cold snare.                            Resection was complete, and retrieval was complete.                            To stop active bleeding, four hemostatic clips were  successfully placed (MR conditional). Area was                            successfully injected with 7 mL of a 1:10,000                            solution of epinephrine for hemostasis. There was                            no bleeding at the end of the maneuvers.                           The exam of the stomach was otherwise normal.                           The duodenal bulb and second portion of the                            duodenum were normal. Biopsies for histology were                            taken with a cold forceps for evaluation of celiac                            disease. Complications:            No immediate complications. Estimated Blood Loss:     Estimated blood loss was minimal. Impression:               - Normal esophagus.                           - Multiple gastric polyps.                           - A single gastric polyp with mild bleeding.                            Resected and retrieved. Mild bleeding post                            polypectomy. Clips (MR conditional) were  placed.                            Injected with epinephrine.                           - Normal duodenal bulb and second portion of the                            duodenum. Biopsied. Recommendation:           - Patient has a contact number available for                            emergencies. The signs and symptoms of potential  delayed complications were discussed with the                            patient. Return to normal activities tomorrow.                            Written discharge instructions were provided to the                            patient.                           - Resume previous diet.                           - Continue present medications.                           - No aspirin, ibuprofen, naproxen, or other                            non-steroidal anti-inflammatory drugs for 1 week                            after polyp removal.                           - Resume Plavix (clopidogrel) at prior dose in 5                            days. Refer to managing physician for further                            adjustment of therapy.                           - Await pathology results. Ladene Artist, MD 02/06/2022 4:38:16 PM This report has been signed electronically.

## 2022-02-06 NOTE — Progress Notes (Signed)
Called to room to assist during endoscopic procedure.  Patient ID and intended procedure confirmed with present staff. Received instructions for my participation in the procedure from the performing physician.   Nathan Santiago, Endo tech first reported she did not retrieve Gastric fundus Polyp,  She did end up retrieving the polyp.  A new pathology order was then placed with a gastric fundus polyp.  maw

## 2022-02-06 NOTE — Progress Notes (Signed)
Updated medical record.

## 2022-02-07 ENCOUNTER — Telehealth: Payer: Self-pay

## 2022-02-07 NOTE — Telephone Encounter (Signed)
  Follow up Call-     02/06/2022    2:28 PM  Call back number  Post procedure Call Back phone  # [phone]  Permission to leave phone message Yes     Patient questions:  Do you have a fever, pain , or abdominal swelling? No. Pain Score  0 *  Have you tolerated food without any problems? Yes.    Have you been able to return to your normal activities? Yes.    Do you have any questions about your discharge instructions: Diet   No. Medications  No. Follow up visit  No.  Do you have questions or concerns about your Care? No.  Actions: * If pain score is 4 or above: No action needed, pain <4.  Pt denied bleeding or any other discomfort. maw

## 2022-02-21 ENCOUNTER — Encounter: Payer: Self-pay | Admitting: Gastroenterology

## 2022-04-26 ENCOUNTER — Encounter: Payer: Self-pay | Admitting: Podiatry

## 2022-04-26 ENCOUNTER — Ambulatory Visit: Payer: Medicare Other | Admitting: Podiatry

## 2022-04-26 DIAGNOSIS — M722 Plantar fascial fibromatosis: Secondary | ICD-10-CM

## 2022-04-26 MED ORDER — TRIAMCINOLONE ACETONIDE 10 MG/ML IJ SUSP
20.0000 mg | Freq: Once | INTRAMUSCULAR | Status: AC
  Administered 2022-04-26: 20 mg

## 2022-04-26 NOTE — Progress Notes (Signed)
Subjective:   Patient ID: Nathan Santiago, male   DOB: 88 y.o.   MRN: [id number]   HPI Patient presents with pain in the bottom of both feet stating that he needs injections   ROS      Objective:  Physical Exam  Neurovascular status intact with inflammation pain plantar heel region bilateral with patient having not been treated for 7 months     Assessment:  Acute plantar fasciitis bilateral heels     Plan:  Sterile prep injected the plantar fascia bilateral 3 mg Kenalog 5 mg Xylocaine advised on supportive therapy

## 2022-05-02 ENCOUNTER — Other Ambulatory Visit: Payer: Self-pay

## 2022-05-02 ENCOUNTER — Emergency Department (HOSPITAL_COMMUNITY): Payer: Medicare Other

## 2022-05-02 ENCOUNTER — Encounter (HOSPITAL_COMMUNITY): Payer: Self-pay | Admitting: Emergency Medicine

## 2022-05-02 ENCOUNTER — Other Ambulatory Visit (HOSPITAL_BASED_OUTPATIENT_CLINIC_OR_DEPARTMENT_OTHER): Payer: Self-pay

## 2022-05-02 ENCOUNTER — Emergency Department (HOSPITAL_COMMUNITY)
Admission: EM | Admit: 2022-05-02 | Discharge: 2022-05-02 | Disposition: A | Discharge status: Home / Self Care | Payer: Medicare Other | Attending: Emergency Medicine | Admitting: Emergency Medicine

## 2022-05-02 DIAGNOSIS — Z79899 Other long term (current) drug therapy: Secondary | ICD-10-CM | POA: Diagnosis not present

## 2022-05-02 DIAGNOSIS — I10 Essential (primary) hypertension: Secondary | ICD-10-CM | POA: Diagnosis not present

## 2022-05-02 DIAGNOSIS — Z7982 Long term (current) use of aspirin: Secondary | ICD-10-CM | POA: Diagnosis not present

## 2022-05-02 DIAGNOSIS — W01198A Fall on same level from slipping, tripping and stumbling with subsequent striking against other object, initial encounter: Secondary | ICD-10-CM | POA: Diagnosis not present

## 2022-05-02 DIAGNOSIS — I2510 Atherosclerotic heart disease of native coronary artery without angina pectoris: Secondary | ICD-10-CM | POA: Insufficient documentation

## 2022-05-02 DIAGNOSIS — W19XXXA Unspecified fall, initial encounter: Secondary | ICD-10-CM

## 2022-05-02 DIAGNOSIS — R42 Dizziness and giddiness: Secondary | ICD-10-CM | POA: Diagnosis present

## 2022-05-02 LAB — CBC WITH DIFFERENTIAL/PLATELET
Abs Immature Granulocytes: 0.03 10*3/uL (ref 0.00–0.07)
Basophils Absolute: 0 10*3/uL (ref 0.0–0.1)
Basophils Relative: 0 %
Eosinophils Absolute: 0.2 10*3/uL (ref 0.0–0.5)
Eosinophils Relative: 2 %
HCT: 40.9 % (ref 39.0–52.0)
Hemoglobin: 13.5 g/dL (ref 13.0–17.0)
Immature Granulocytes: 0 %
Lymphocytes Relative: 11 %
Lymphs Abs: 1.1 10*3/uL (ref 0.7–4.0)
MCH: 27.6 pg (ref 26.0–34.0)
MCHC: 33 g/dL (ref 30.0–36.0)
MCV: 83.6 fL (ref 80.0–100.0)
Monocytes Absolute: 0.6 10*3/uL (ref 0.1–1.0)
Monocytes Relative: 6 %
Neutro Abs: 8.3 10*3/uL — ABNORMAL HIGH (ref 1.7–7.7)
Neutrophils Relative %: 81 %
Platelets: 224 10*3/uL (ref 150–400)
RBC: 4.89 MIL/uL (ref 4.22–5.81)
RDW: 14.8 % (ref 11.5–15.5)
WBC: 10.2 10*3/uL (ref 4.0–10.5)
nRBC: 0 % (ref 0.0–0.2)

## 2022-05-02 LAB — BASIC METABOLIC PANEL
Anion gap: 11 (ref 5–15)
BUN: 24 mg/dL — ABNORMAL HIGH (ref 8–23)
CO2: 22 mmol/L (ref 22–32)
Calcium: 9.6 mg/dL (ref 8.9–10.3)
Chloride: 106 mmol/L (ref 98–111)
Creatinine, Ser: 1.1 mg/dL (ref 0.61–1.24)
GFR, Estimated: >60 mL/min (ref >60)
Glucose, Bld: 152 mg/dL — ABNORMAL HIGH (ref 70–99)
Potassium: 4 mmol/L (ref 3.5–5.1)
Sodium: 139 mmol/L (ref 135–145)

## 2022-05-02 LAB — TROPONIN I (HIGH SENSITIVITY): Troponin I (High Sensitivity): 9 ng/L (ref <18)

## 2022-05-02 MED ORDER — FLUAD QUADRIVALENT 0.5 ML IM PRSY
PREFILLED_SYRINGE | INTRAMUSCULAR | 0 refills | Status: DC
  Filled 2022-05-02: qty 0.5, 1d supply, fill #0

## 2022-05-02 NOTE — ED Notes (Signed)
Pt to CT at this time.

## 2022-05-02 NOTE — ED Triage Notes (Signed)
Pt BIB [facility] from [facility] as level 2 fall on thinners; pt takes Plavix; per EMS staff pt fell from a standing position after reporting dizziness; per staff pt has had multiple falls recently due to same; hx of Vertigo; pt hypertensive at 214/92 upon arrival, pt reports hx of HTN and denies missing any doses of home meds; pt a&o x 4; EDP at bedside for MSE upon pt arrival

## 2022-05-02 NOTE — ED Provider Notes (Signed)
[facility] EMERGENCY DEPARTMENT Provider Note   CSN: [id number] Arrival date & time: 05/02/22  2114     History  Chief Complaint  Patient presents with   [name]    Nathan Santiago is a 88 y.o. male.  The history is provided by the patient and the EMS personnel.  Fall  Patient presents after fall.  Became lightheaded and stood up.  Hit the back of his head.  History of use of Plavix.  No loss conscious.  States he felt dizzy when he stood up and had his had a fall or 2 like this before.  States he has a history of vertigo.  No numbness or weakness.  No confusion.    Past Medical History:  Diagnosis Date   AAA (abdominal aortic aneurysm) ([place])    asymptomatic   AAA (abdominal aortic aneurysm) ([place]) 2010   peripheral  angiogram-- bilateral SFA DISEASE  and 60 to 70% infrarenaal abd. aortic stenosis with 15 -mm gradient   Adenomatous colon polyp 11/2003   CAD (coronary artery disease)    Gait abnormality 02/13/2017   Gastroparesis    pt denies   GERD (gastroesophageal reflux disease)    w/ LPR   History of kidney stones    x2   Hyperlipidemia    Hypertension    IDA (iron deficiency anemia)    Peripheral arterial disease ([place])    RCEA  by Dr Lemar Livings   PUD (peptic ulcer disease)    pt unaware   Vertigo    Contact Home Medications Prior to Admission medications   Medication Sig Start Date End Date Taking? Authorizing Provider  acetaminophen (TYLENOL) 500 MG tablet Take 1 tablet (500 mg total) by mouth 2 (two) times daily as needed. 01/13/19  Yes Wert, Margreta Journey, NP  amLODipine (NORVASC) 5 MG tablet Take 5 mg by mouth daily.   Yes [provider]  Apoaequorin (PREVAGEN PO) Take 1 tablet by mouth daily.   Yes [provider]  aspirin EC 81 MG tablet Take 1 tablet (81 mg total) by mouth 2 (two) times a day. Patient taking differently: Take 81 mg by mouth daily. 01/03/19  Yes Netta Cedars, MD  atenolol (TENORMIN) 50 MG tablet  Take 50 mg by mouth daily.   Yes [provider]  atorvastatin (LIPITOR) 40 MG tablet Take 40 mg by mouth daily at 6 PM.  11/14/16  Yes [provider]  Biotin 10 MG CAPS Take 10 mg by mouth daily.   Yes [provider]  cholecalciferol (VITAMIN D3) 25 MCG (1000 UT) tablet Take 1,000 Units by mouth daily.   Yes [provider]  clopidogrel (PLAVIX) 75 MG tablet Take 75 mg by mouth every evening.    Yes [provider]  diazepam (VALIUM) 5 MG tablet Take 2.5-5 mg by mouth every 8 (eight) hours as needed (vertigo). 01/21/18  Yes [provider]  hydrochlorothiazide 25 MG tablet Take 25 mg by mouth daily.   Yes [provider]  HYDROcodone bit-homatropine (HYCODAN) 5-1.5 MG/5ML syrup Take 5 mLs by mouth every 6 (six) hours as needed for cough. 11/29/20  Yes [provider]  iron polysaccharides (NIFEREX) 150 MG capsule Take 150 mg by mouth 3 (three) times a week.   Yes [provider]  losartan (COZAAR) 50 MG tablet Take 50 mg by mouth every evening.    Yes [provider]  Magnesium 250 MG TABS Take 250 mg by mouth at bedtime.  Yes [provider]  Misc Natural Products (GLUCOSAMINE CHONDROITIN TRIPLE) TABS Take 2 tablets by mouth daily.    Yes [provider]  Multiple Vitamin (MULTIVITAMIN WITH MINERALS) TABS tablet Take 1 tablet by mouth daily.   Yes [provider]  pantoprazole (PROTONIX) 40 MG tablet TAKE 1 TABLET BY MOUTH TWICE A DAY Patient taking differently: Take 40 mg by mouth 2 (two) times daily. 10/21/18  Yes Collene Gobble, MD  potassium chloride SA (K-DUR,KLOR-CON) 20 MEQ tablet Take 20 mEq by mouth every evening.  07/16/13  Yes [provider]  traMADol (ULTRAM) 50 MG tablet Take 50 mg by mouth every 6 (six) hours as needed for severe pain. 01/16/19  Yes [provider]  influenza vaccine adjuvanted (FLUAD QUADRIVALENT) 0.5 ML injection Inject into the  muscle. 05/01/22   Carlyle Basques, MD      Allergies    Lisinopril and Niaspan [niacin er]    Review of Systems   Review of Systems  Physical Exam Updated Vital Signs BP (!) 189/74   Pulse 82   Resp 17   Ht '5\' 4"'$  (1.626 m)   Wt 73.9 kg   SpO2 94%   BMI 27.98 kg/m  Physical Exam Vitals and nursing note reviewed.  Eyes:     Extraocular Movements: Extraocular movements intact.  Cardiovascular:     Rate and Rhythm: Regular rhythm.  Pulmonary:     Breath sounds: No rhonchi.  Abdominal:     Tenderness: There is no abdominal tenderness.  Musculoskeletal:        General: No tenderness.     Cervical back: Neck supple. No tenderness.  Neurological:     Mental Status: He is alert.     Comments: Initially tremulous bilaterally.     ED Results / Procedures / Treatments   Labs (all labs ordered are listed, but only abnormal results are displayed) Labs Reviewed  BASIC METABOLIC PANEL - Abnormal; Notable for the following components:      Result Value   Glucose, Bld 152 (*)    BUN 24 (*)    All other components within normal limits  CBC WITH DIFFERENTIAL/PLATELET - Abnormal; Notable for the following components:   Neutro Abs 8.3 (*)    All other components within normal limits  TROPONIN I (HIGH SENSITIVITY)  TROPONIN I (HIGH SENSITIVITY)    EKG None  Radiology CT HEAD WO CONTRAST (5MM)  Result Date: 05/02/2022 CLINICAL DATA:  Status post trauma. EXAM: CT HEAD WITHOUT CONTRAST TECHNIQUE: Contiguous axial images were obtained from the base of the skull through the vertex without intravenous contrast. RADIATION DOSE REDUCTION: This exam was performed according to the departmental dose-optimization program which includes automated exposure control, adjustment of the mA and/or kV according to patient size and/or use of iterative reconstruction technique. COMPARISON:  November 22, 2016 FINDINGS: Brain: There is mild to moderate severity cerebral atrophy with widening of the  extra-axial spaces and ventricular dilatation. There are areas of decreased attenuation within the white matter tracts of the supratentorial brain, consistent with microvascular disease changes. Vascular: No hyperdense vessel or unexpected calcification. Skull: Normal. Negative for fracture or focal lesion. Sinuses/Orbits: No acute finding. Other: None. IMPRESSION: 1. No acute intracranial abnormality. 2. Cerebral atrophy and microvascular disease changes of the supratentorial brain. Electronically Signed   By: Virgina Norfolk M.D.   On: 05/02/2022 21:42   DG Chest Portable 1 View  Result Date: 05/02/2022 CLINICAL DATA:  Fall. EXAM: PORTABLE CHEST 1 VIEW COMPARISON:  January 06, 2021. FINDINGS: The heart size and mediastinal contours are within normal limits. Both lungs are clear. The visualized skeletal structures are unremarkable. IMPRESSION: No active disease. Electronically Signed   By: Marijo Conception M.D.   On: 05/02/2022 21:24    Procedures Procedures    Medications Ordered in ED Medications - No data to display  ED Course/ Medical Decision Making/ A&P                           Medical Decision Making Amount and/or Complexity of Data Reviewed Labs: ordered. Radiology: ordered.   Patient came in as a level 2 trauma.  Initially hypertensive.  Fall on Plavix.  Hit head.  CT scan done reassuring.  No acute hemorrhage.  Does have previous strokes.  Somewhat dizzy but nonfocal exam after he was warmed up.  Initially states he was cooled and had some shivering.  Has dizziness 2.  Not having nystagmus.  States he had vertigo in the past.  That also was improved.  States he tends to get it at night if he gets up in the mornings.  Hypertensive here and has actually improved somewhat compared to before.  With previous vertigo I think this more likely peripheral cause as opposed to like a central vertigo.  Do not think he needs admission in the hospital at this time.  Eager to go home and will  discharge with outpatient follow-up.        Final Clinical Impression(s) / ED Diagnoses Final diagnoses:  Fall, initial encounter  Hypertension, unspecified type    Rx / DC Orders ED Discharge Orders     None         Davonna Belling, MD 05/02/22 2255

## 2022-05-02 NOTE — ED Notes (Signed)
EDP at bedside to complete MSE, pt unable to sign for MSE but verbalizes understanding

## 2022-05-02 NOTE — ED Notes (Signed)
Trauma Response Nurse Documentation   Nathan Santiago is a 88 y.o. male arriving to [facility] ED via EMS  On clopidogrel 75 mg daily. Trauma was activated as a Level 2 by ED charge RN based on the following trauma criteria Elderly patients > 65 with head trauma on anti-coagulation (excluding ASA). Trauma team at the bedside on patient arrival.   Patient cleared for CT by Dr. Alvino Chapel EDP. Pt transported to CT with trauma response nurse present to monitor. RN remained with the patient throughout their absence from the department for clinical observation.   GCS 15.  History   Past Medical History:  Diagnosis Date   AAA (abdominal aortic aneurysm) ([place])    asymptomatic   AAA (abdominal aortic aneurysm) ([place]) 2010   peripheral  angiogram-- bilateral SFA DISEASE  and 60 to 70% infrarenaal abd. aortic stenosis with 15 -mm gradient   Adenomatous colon polyp 11/2003   CAD (coronary artery disease)    Gait abnormality 02/13/2017   Gastroparesis    pt denies   GERD (gastroesophageal reflux disease)    w/ LPR   History of kidney stones    x2   Hyperlipidemia    Hypertension    IDA (iron deficiency anemia)    Peripheral arterial disease ([place])    RCEA  by Dr Lemar Livings   PUD (peptic ulcer disease)    pt unaware   Vertigo      Past Surgical History:  Procedure Laterality Date   ABDOMINAL AORTOGRAM W/LOWER EXTREMITY Bilateral 08/05/2018   Procedure: ABDOMINAL AORTOGRAM W/LOWER EXTREMITY;  Surgeon: Lorretta Harp, MD;  Location: [place] [facility];  Service: Cardiovascular;  Laterality: Bilateral;   ABDOMINAL AORTOGRAM W/LOWER EXTREMITY N/A 09/26/2021   Procedure: ABDOMINAL AORTOGRAM W/LOWER EXTREMITY;  Surgeon: Lorretta Harp, MD;  Location: [place] [facility];  Service: Cardiovascular;  Laterality: N/A;   AORTOGRAM  04/24/2016    Abdominal aortogram, bilateral iliac angiogram, bifemoral runoff   CARDIAC CATHETERIZATION  05/12/2005   RCA   carotid doppler  01/24/2013   RICA  endarterectomy,left CCA 0-49%; left bulb and prox ICA 50-69%; bilaateral subclavian < 50%   CAROTID ENDARTERECTOMY  11/01/2010   CATARACT EXTRACTION     COLONOSCOPY     CORONARY ANGIOPLASTY  05/18/2005   2 STENTS distal RCA AND PROXIMAL-MID RCA   5 total stents per pt.   CYSTOSCOPY/URETEROSCOPY/HOLMIUM LASER/STENT PLACEMENT Left 01/11/2018   Procedure: LEFT URETEROSCOPY/HOLMIUM LASER/STENT PLACEMENT;  Surgeon: Ardis Hughs, MD;  Location: [facility];  Service: Urology;  Laterality: Left;   DOPPLER ECHOCARDIOGRAPHY  02/07/2012   EF 55%,SHOWED NO ISCHEMIA    HERNIA REPAIR     umbilical   lower arterial  doppler  02/04/2013   aotra 1.5 x 1.5 cm; distal abd aorta 70-99%,proximal common iliac arteries -very stenotic with increased velocities>50%,may be falsely elevated as a result of residual plaque from the distal aorta stenosis   lower extremity doppler  June 18 ,2013   ABI'S ABNORMAL, RABI was 0.88 and LABI 0.75 ,with 3-vessel  run off   NM MYOVIEW LTD  MAY 23,2011   showed no significant ischemia;   NM MYOVIEW LTD  04/22/2008   ef 77%,exercise capcity 6 METS ,exaggerated blood pressure response to exercise   PERIPHERAL VASCULAR CATHETERIZATION N/A 04/24/2016   Procedure: Lower Extremity Angiography;  Surgeon: Lorretta Harp, MD;  Location: [place] [facility];  Service: Cardiovascular;  Laterality: N/A;   PERIPHERAL VASCULAR CATHETERIZATION  04/24/2016   Procedure: Peripheral  Vascular Intervention;  Surgeon: Lorretta Harp, MD;  Location: [place] [facility];  Service: Cardiovascular;;  Aorta   retrograde central aortic catheterization  05/19/2005   cutting balloon atherectomy, c-circ stenosis with DES STENTING CYPHER   TONSILLECTOMY     TOTAL KNEE ARTHROPLASTY Left 01/03/2019   Procedure: TOTAL KNEE ARTHROPLASTY;  Surgeon: Netta Cedars, MD;  Location: [facility];  Service: Orthopedics;  Laterality: Left;  with IS block   TRANSURETHRAL RESECTION OF PROSTATE N/A 09/08/2016   Procedure:  TRANSURETHRAL RESECTION OF THE PROSTATE (TURP);  Surgeon: Ardis Hughs, MD;  Location: [facility];  Service: Urology;  Laterality: N/A;   TRANSURETHRAL RESECTION OF PROSTATE     10-10-17  Dr. Louis Meckel   TRANSURETHRAL RESECTION OF PROSTATE N/A 10/10/2017   Procedure: TRANSURETHRAL RESECTION OF THE PROSTATE (TURP);  Surgeon: Ardis Hughs, MD;  Location: [facility];  Service: Urology;  Laterality: N/A;   VASECTOMY         Initial Focused Assessment (If applicable, or please see trauma documentation): Alert/oriented male presents via EMS after multiple falls this evening, one around 1700 and another just prior to arrival d/t dizziness. Hypertensive on arrival Airway patent/unobstructed, BS equal, clear No obvious uncontrolled hemorrhage GCS 15 PERRLA 94m  CT's Completed:   CT Head   Interventions:  CT head Portable chest XRAY C-spine cleared by Dr. PAlvino ChapelTrauma lab draw and IV start Family presence  Plan for disposition:  Pending workup  Consults completed:  none at the time of this note.  Event Summary: Patient arrives via EMS for multiple falls after experiencing dizziness. Hypertensive upon arrival, manual BP 240/110. TRN escorted to CT, dispo pending workup.  MTP Summary (If applicable): NA  Bedside handoff with ED RN [place]    Phoenyx Melka O Shaya Reddick  Trauma Response RN  Please call TRN at [phone] further assistance.

## 2022-05-02 NOTE — ED Notes (Signed)
Report provided to [facility], staff will pick pt up, ETA 10-15mns

## 2022-05-02 NOTE — Progress Notes (Signed)
Orthopedic Tech Progress Note Patient Details:  Jomari Bartnik [facility] 1930/09/29 [id number]  Patient ID: Signa Kell, male   DOB: 10/12/1930, 88 y.o.   MRN: [id number] Level 2 trauma not needed. Berenice Primas [place] 05/02/2022, 9:50 PM

## 2022-05-13 ENCOUNTER — Other Ambulatory Visit: Payer: Self-pay

## 2022-05-13 ENCOUNTER — Observation Stay (HOSPITAL_COMMUNITY): Payer: Medicare Other

## 2022-05-13 ENCOUNTER — Observation Stay (HOSPITAL_COMMUNITY)
Admission: EM | Admit: 2022-05-13 | Discharge: 2022-05-16 | Disposition: A | Discharge status: Skilled Nursing Facility | Payer: Medicare Other | Attending: Internal Medicine | Admitting: Internal Medicine

## 2022-05-13 ENCOUNTER — Emergency Department (HOSPITAL_COMMUNITY): Payer: Medicare Other

## 2022-05-13 ENCOUNTER — Encounter (HOSPITAL_COMMUNITY): Payer: Self-pay | Admitting: Emergency Medicine

## 2022-05-13 DIAGNOSIS — R7989 Other specified abnormal findings of blood chemistry: Secondary | ICD-10-CM | POA: Diagnosis not present

## 2022-05-13 DIAGNOSIS — R338 Other retention of urine: Secondary | ICD-10-CM | POA: Diagnosis not present

## 2022-05-13 DIAGNOSIS — E876 Hypokalemia: Secondary | ICD-10-CM | POA: Insufficient documentation

## 2022-05-13 DIAGNOSIS — Z7982 Long term (current) use of aspirin: Secondary | ICD-10-CM | POA: Diagnosis not present

## 2022-05-13 DIAGNOSIS — R339 Retention of urine, unspecified: Secondary | ICD-10-CM | POA: Diagnosis not present

## 2022-05-13 DIAGNOSIS — Z8679 Personal history of other diseases of the circulatory system: Secondary | ICD-10-CM | POA: Diagnosis present

## 2022-05-13 DIAGNOSIS — Z20822 Contact with and (suspected) exposure to covid-19: Secondary | ICD-10-CM | POA: Insufficient documentation

## 2022-05-13 DIAGNOSIS — Z955 Presence of coronary angioplasty implant and graft: Secondary | ICD-10-CM | POA: Diagnosis not present

## 2022-05-13 DIAGNOSIS — Z7902 Long term (current) use of antithrombotics/antiplatelets: Secondary | ICD-10-CM | POA: Insufficient documentation

## 2022-05-13 DIAGNOSIS — E7800 Pure hypercholesterolemia, unspecified: Secondary | ICD-10-CM | POA: Diagnosis not present

## 2022-05-13 DIAGNOSIS — Z96652 Presence of left artificial knee joint: Secondary | ICD-10-CM | POA: Insufficient documentation

## 2022-05-13 DIAGNOSIS — I2510 Atherosclerotic heart disease of native coronary artery without angina pectoris: Secondary | ICD-10-CM | POA: Diagnosis not present

## 2022-05-13 DIAGNOSIS — N289 Disorder of kidney and ureter, unspecified: Secondary | ICD-10-CM

## 2022-05-13 DIAGNOSIS — K219 Gastro-esophageal reflux disease without esophagitis: Secondary | ICD-10-CM

## 2022-05-13 DIAGNOSIS — R531 Weakness: Secondary | ICD-10-CM | POA: Diagnosis present

## 2022-05-13 DIAGNOSIS — Z79899 Other long term (current) drug therapy: Secondary | ICD-10-CM | POA: Diagnosis not present

## 2022-05-13 DIAGNOSIS — G629 Polyneuropathy, unspecified: Secondary | ICD-10-CM

## 2022-05-13 DIAGNOSIS — Z87891 Personal history of nicotine dependence: Secondary | ICD-10-CM | POA: Diagnosis not present

## 2022-05-13 DIAGNOSIS — I10 Essential (primary) hypertension: Secondary | ICD-10-CM

## 2022-05-13 DIAGNOSIS — I739 Peripheral vascular disease, unspecified: Secondary | ICD-10-CM

## 2022-05-13 DIAGNOSIS — N179 Acute kidney failure, unspecified: Secondary | ICD-10-CM | POA: Diagnosis not present

## 2022-05-13 LAB — COMPREHENSIVE METABOLIC PANEL
ALT: 21 U/L (ref 0–44)
AST: 22 U/L (ref 15–41)
Albumin: 3.5 g/dL (ref 3.5–5.0)
Alkaline Phosphatase: 70 U/L (ref 38–126)
Anion gap: 7 (ref 5–15)
BUN: 33 mg/dL — ABNORMAL HIGH (ref 8–23)
CO2: 28 mmol/L (ref 22–32)
Calcium: 9.3 mg/dL (ref 8.9–10.3)
Chloride: 108 mmol/L (ref 98–111)
Creatinine, Ser: 1.43 mg/dL — ABNORMAL HIGH (ref 0.61–1.24)
GFR, Estimated: 47 mL/min — ABNORMAL LOW (ref >60)
Glucose, Bld: 114 mg/dL — ABNORMAL HIGH (ref 70–99)
Potassium: 4 mmol/L (ref 3.5–5.1)
Sodium: 143 mmol/L (ref 135–145)
Total Bilirubin: 0.7 mg/dL (ref 0.3–1.2)
Total Protein: 6.7 g/dL (ref 6.5–8.1)

## 2022-05-13 LAB — CBC WITH DIFFERENTIAL/PLATELET
Abs Immature Granulocytes: 0.02 10*3/uL (ref 0.00–0.07)
Basophils Absolute: 0 10*3/uL (ref 0.0–0.1)
Basophils Relative: 0 %
Eosinophils Absolute: 0.4 10*3/uL (ref 0.0–0.5)
Eosinophils Relative: 4 %
HCT: 37.7 % — ABNORMAL LOW (ref 39.0–52.0)
Hemoglobin: 12.6 g/dL — ABNORMAL LOW (ref 13.0–17.0)
Immature Granulocytes: 0 %
Lymphocytes Relative: 12 %
Lymphs Abs: 1.1 10*3/uL (ref 0.7–4.0)
MCH: 28.4 pg (ref 26.0–34.0)
MCHC: 33.4 g/dL (ref 30.0–36.0)
MCV: 85.1 fL (ref 80.0–100.0)
Monocytes Absolute: 0.7 10*3/uL (ref 0.1–1.0)
Monocytes Relative: 7 %
Neutro Abs: 7.3 10*3/uL (ref 1.7–7.7)
Neutrophils Relative %: 77 %
Platelets: 245 10*3/uL (ref 150–400)
RBC: 4.43 MIL/uL (ref 4.22–5.81)
RDW: 14.1 % (ref 11.5–15.5)
WBC: 9.5 10*3/uL (ref 4.0–10.5)
nRBC: 0 % (ref 0.0–0.2)

## 2022-05-13 LAB — I-STAT VENOUS BLOOD GAS, ED
Acid-Base Excess: 1 mmol/L (ref 0.0–2.0)
Bicarbonate: 24.1 mmol/L (ref 20.0–28.0)
Calcium, Ion: 1.14 mmol/L — ABNORMAL LOW (ref 1.15–1.40)
HCT: 34 % — ABNORMAL LOW (ref 39.0–52.0)
Hemoglobin: 11.6 g/dL — ABNORMAL LOW (ref 13.0–17.0)
O2 Saturation: 95 %
Potassium: 3.4 mmol/L — ABNORMAL LOW (ref 3.5–5.1)
Sodium: 142 mmol/L (ref 135–145)
TCO2: 25 mmol/L (ref 22–32)
pCO2, Ven: 33.3 mmHg — ABNORMAL LOW (ref 44–60)
pH, Ven: 7.467 — ABNORMAL HIGH (ref 7.25–7.43)
pO2, Ven: 70 mmHg — ABNORMAL HIGH (ref 32–45)

## 2022-05-13 LAB — URINALYSIS, ROUTINE W REFLEX MICROSCOPIC
Bilirubin Urine: NEGATIVE
Glucose, UA: NEGATIVE mg/dL
Hgb urine dipstick: NEGATIVE
Ketones, ur: NEGATIVE mg/dL
Leukocytes,Ua: NEGATIVE
Nitrite: NEGATIVE
Protein, ur: NEGATIVE mg/dL
Specific Gravity, Urine: 1.019 (ref 1.005–1.030)
pH: 5 (ref 5.0–8.0)

## 2022-05-13 LAB — MAGNESIUM: Magnesium: 2.4 mg/dL (ref 1.7–2.4)

## 2022-05-13 LAB — TROPONIN I (HIGH SENSITIVITY)
Troponin I (High Sensitivity): 8 ng/L (ref <18)
Troponin I (High Sensitivity): 8 ng/L (ref <18)

## 2022-05-13 MED ORDER — TAMSULOSIN HCL 0.4 MG PO CAPS
0.4000 mg | ORAL_CAPSULE | Freq: Every day | ORAL | Status: DC
  Administered 2022-05-14 – 2022-05-15 (×2): 0.4 mg via ORAL
  Filled 2022-05-13 (×2): qty 1

## 2022-05-13 MED ORDER — TRAMADOL HCL 50 MG PO TABS
50.0000 mg | ORAL_TABLET | Freq: Four times a day (QID) | ORAL | Status: DC | PRN
  Administered 2022-05-14: 100 mg via ORAL
  Filled 2022-05-13: qty 2

## 2022-05-13 MED ORDER — SODIUM CHLORIDE 0.9 % IV BOLUS
500.0000 mL | Freq: Once | INTRAVENOUS | Status: AC
  Administered 2022-05-13: 500 mL via INTRAVENOUS

## 2022-05-13 MED ORDER — GABAPENTIN 100 MG PO CAPS
200.0000 mg | ORAL_CAPSULE | Freq: Every day | ORAL | Status: DC
  Administered 2022-05-14 – 2022-05-15 (×3): 200 mg via ORAL
  Filled 2022-05-13 (×3): qty 2

## 2022-05-13 NOTE — Assessment & Plan Note (Signed)
Cont potonix 40 mg po qday

## 2022-05-13 NOTE — ED Provider Notes (Signed)
[facility] EMERGENCY DEPARTMENT Provider Note   CSN: [id number] Arrival date & time: 05/13/22  1444     History  Chief Complaint  Patient presents with   Extremity Weakness    Nathan Santiago is a 88 y.o. male.  88 year old male brought in by EMS with wife from [place] independent living facility where patient lives with his wife, notes he has had progressively worsening leg weakness, difficulty ambulating, now requires three people to assist him with walking and unable to care for self at home. Decreased urine output without dysuria. Denies chest pain, shortness of breath or abdominal pain.  Had a fall about a week or so ago, was seen here at that time with normal head CT.  No other complaints or concerns today.       Home Medications Prior to Admission medications   Medication Sig Start Date End Date Taking? Authorizing Provider  acetaminophen (TYLENOL) 500 MG tablet Take 1 tablet (500 mg total) by mouth 2 (two) times daily as needed. 01/13/19   Royal Hawthorn, NP  amLODipine (NORVASC) 5 MG tablet Take 5 mg by mouth daily.    [provider]  Apoaequorin (PREVAGEN PO) Take 1 tablet by mouth daily.    [provider]  aspirin EC 81 MG tablet Take 1 tablet (81 mg total) by mouth 2 (two) times a day. Patient taking differently: Take 81 mg by mouth daily. 01/03/19   Netta Cedars, MD  atenolol (TENORMIN) 50 MG tablet Take 50 mg by mouth daily.    [provider]  atorvastatin (LIPITOR) 40 MG tablet Take 40 mg by mouth daily at 6 PM.  11/14/16   [provider]  Biotin 10 MG CAPS Take 10 mg by mouth daily.    [provider]  cholecalciferol (VITAMIN D3) 25 MCG (1000 UT) tablet Take 1,000 Units by mouth daily.    [provider]  clopidogrel (PLAVIX) 75 MG tablet Take 75 mg by mouth every evening.     [provider]  diazepam (VALIUM) 5 MG tablet Take 2.5-5 mg by mouth every 8 (eight) hours as  needed (vertigo). 01/21/18   [provider]  hydrochlorothiazide 25 MG tablet Take 25 mg by mouth daily.    [provider]  HYDROcodone bit-homatropine (HYCODAN) 5-1.5 MG/5ML syrup Take 5 mLs by mouth every 6 (six) hours as needed for cough. 11/29/20   [provider]  influenza vaccine adjuvanted (FLUAD QUADRIVALENT) 0.5 ML injection Inject into the muscle. 05/01/22   Carlyle Basques, MD  iron polysaccharides (NIFEREX) 150 MG capsule Take 150 mg by mouth 3 (three) times a week.    [provider]  losartan (COZAAR) 50 MG tablet Take 50 mg by mouth every evening.     [provider]  Magnesium 250 MG TABS Take 250 mg by mouth at bedtime.    [provider]  Misc Natural Products (GLUCOSAMINE CHONDROITIN TRIPLE) TABS Take 2 tablets by mouth daily.     [provider]  Multiple Vitamin (MULTIVITAMIN WITH MINERALS) TABS tablet Take 1 tablet by mouth daily.    [provider]  pantoprazole (PROTONIX) 40 MG tablet TAKE 1 TABLET BY MOUTH TWICE A DAY Patient taking differently: Take 40 mg by mouth 2 (two) times daily. 10/21/18   Collene Gobble, MD  potassium chloride SA (K-DUR,KLOR-CON) 20 MEQ tablet Take 20 mEq by mouth every evening.  07/16/13   [provider]  traMADol (ULTRAM) 50 MG tablet Take  50 mg by mouth every 6 (six) hours as needed for severe pain. 01/16/19   [provider]      Allergies    Lisinopril and Niaspan [niacin er]    Review of Systems   Review of Systems Negative except as per HPI Physical Exam Updated Vital Signs BP (!) 193/78   Pulse 79   Temp 98.1 F (36.7 C)   Resp (!) 21   SpO2 96%  Physical Exam Vitals and nursing note reviewed.  Constitutional:      General: He is not in acute distress.    Appearance: He is well-developed. He is not diaphoretic.  HENT:     Head: Normocephalic and atraumatic.     Mouth/Throat:     Mouth: Mucous membranes are dry.  Cardiovascular:      Rate and Rhythm: Normal rate and regular rhythm.     Pulses: Normal pulses.     Heart sounds: Normal heart sounds.  Pulmonary:     Effort: Pulmonary effort is normal.     Breath sounds: Normal breath sounds.  Musculoskeletal:     Cervical back: Neck supple.     Right lower leg: No edema.     Left lower leg: No edema.  Skin:    General: Skin is warm and dry.     Findings: No erythema or rash.  Neurological:     Mental Status: He is alert and oriented to person, place, and time.     Motor: No weakness.     Gait: Gait abnormal.  Psychiatric:        Behavior: Behavior normal.     ED Results / Procedures / Treatments   Labs (all labs ordered are listed, but only abnormal results are displayed) Labs Reviewed  COMPREHENSIVE METABOLIC PANEL - Abnormal; Notable for the following components:      Result Value   Glucose, Bld 114 (*)    BUN 33 (*)    Creatinine, Ser 1.43 (*)    GFR, Estimated 47 (*)    All other components within normal limits  CBC WITH DIFFERENTIAL/PLATELET - Abnormal; Notable for the following components:   Hemoglobin 12.6 (*)    HCT 37.7 (*)    All other components within normal limits  MAGNESIUM  URINALYSIS, ROUTINE W REFLEX MICROSCOPIC  TROPONIN I (HIGH SENSITIVITY)  TROPONIN I (HIGH SENSITIVITY)    EKG EKG Interpretation  Date/Time:  Saturday May 13 2022 18:30:18 EDT Ventricular Rate:  73 PR Interval:  190 QRS Duration: 99 QT Interval:  400 QTC Calculation: 441 R Axis:   41 Text Interpretation: Sinus rhythm Abnormal R-wave progression, early transition Nonspecific repol abnormality, diffuse leads Since last tracing T wave abnormality is now Present Confirmed by Noemi Chapel [phone]) on 05/13/2022 6:36:13 PM  Radiology No results found.  Procedures Procedures    Medications Ordered in ED Medications  sodium chloride 0.9 % bolus 500 mL (500 mLs Intravenous New Bag/Given 05/13/22 2115)    ED Course/ Medical Decision Making/ A&P                            Medical Decision Making Amount and/or Complexity of Data Reviewed Labs: ordered.  Risk Decision regarding hospitalization.   This patient presents to the ED for concern of progressively worsening weakness, decreased urine output, this involves an extensive number of treatment options, and is a complaint that carries with it a high risk of complications and morbidity.  The  differential diagnosis includes but not limited to urinary retention, dehydration, metabolic disturbance, UTI, underlying neurological problem   Co morbidities that complicate the patient evaluation  Hypertension, hyperlipidemia, CVA, CAD additional history is reviewed in chart   Additional history obtained:  Additional history obtained from wife at bedside who assist with history as above External records from outside source obtained and reviewed including recent ER visit on 05/02/2022 for fall, striking head, on Plavix.  Had CT head at that time that was unremarkable.   Lab Tests:  I Ordered, and personally interpreted labs.  The pertinent results include: Troponins unchanged x2.  CMP with elevated creatinine to 1.43, previously 1.1.  CBC without significant findings.  Urinalysis is unremarkable.  Magnesium normal.   Cardiac Monitoring: / EKG:  The patient was maintained on a cardiac monitor.  I personally viewed and interpreted the cardiac monitored which showed an underlying rhythm of: Sinus rhythm, rate 73   Consultations Obtained:  I requested consultation with the Dr. Sabra Heck, ER attending who has seen the patient,  and discussed lab and imaging findings as well as pertinent plan - they recommend: Admission for renal insufficiency, urinary retention, weakness Discussed with hospitalist- Dr. Roel Cluck with [facility] who will consult for admission.    Problem List / ED Course / Critical interventions / Medication management  88 year old male brought in by EMS from  independent living facility where he lives with his spouse with concern for progressively worsening weakness, gait disturbance.  Patient is unable to ambulate safely at facility.  Reports decreased urine output, denies dysuria, urgency.  On exam, DP pulses present, lower extremity strength intact- more so globally weak as he tries to sit up in bed and requires assistance- is tremulous, does have significant difficulty transferring from wheelchair to the stretcher with two-person assist.  Exam otherwise unremarkable.  On work-up, found to have 700 cc in bladder without urgency to void.  In and out cath completed.  Patient has elevated creatinine, urinalysis is unremarkable. Seen by ER attending who recommends admission for further work up. Also requested TOC assistance for rehab placement following his hospital stay.  I ordered medication including IVF  for dehydration   Reevaluation of the patient after these medicines showed that the patient stayed the same I have reviewed the patients home medicines and have made adjustments as needed   Social Determinants of Health:  Lives at independent living facility with spouse   Test / Admission - Considered:  Admit for further treatment         Final Clinical Impression(s) / ED Diagnoses Final diagnoses:  Urinary retention  Weakness  Renal insufficiency    Rx / DC Orders ED Discharge Orders     None         Roque Lias 05/13/22 2158    Noemi Chapel, MD 05/16/22 1225

## 2022-05-13 NOTE — Assessment & Plan Note (Signed)
Restart norvasc 5 mg po qday Atenolol 50 mg po qday  and hold cozaar and HCTZ

## 2022-05-13 NOTE — Assessment & Plan Note (Signed)
Both extremities warm pulses present but somewhat diminished.  Patient has known history of PAD Unclear bilateral burning sensation could be consistent with claudication. If persist may consider vascular consult

## 2022-05-13 NOTE — ED Triage Notes (Addendum)
Patient from [place] w/ bilateral leg weakness for 2 days and pain from thigh to feet. Patient describes it as a sharp shooting pain. Patient recently given Rx for gabapentin but has not been compliant with this. Aox4. Patient states he had a fall a couple of weeks ago and was evaluated and eventually discharged without injury. No recent falls since than. Patient on plavix.   BP-170/78 HR-76 Spo2-97% CBG- 145

## 2022-05-13 NOTE — ED Notes (Signed)
734m out with I&O cath

## 2022-05-13 NOTE — Assessment & Plan Note (Signed)
Restart lipitor 40 mg po qday

## 2022-05-13 NOTE — Subjective & Objective (Addendum)
Progressive decline and lower ext weakness Had hard time emptying  Urinary retention Had a recent fall on plavix and had head CT which ws neg He is able to move his legs now no back pain

## 2022-05-13 NOTE — Assessment & Plan Note (Signed)
Chronic stable continue Plavix 75 mg p.o. daily and Lipitor 40 mg p.o. daily as well as atenolol 50 mg p.o. daily

## 2022-05-13 NOTE — Assessment & Plan Note (Signed)
likely due to renal retention,  Place foley rehydrate Obtain KUB and renal US obtain urine elctrolytes

## 2022-05-13 NOTE — Assessment & Plan Note (Signed)
Describes lower extremity bilateral tingling and burning.  Lower extremities are warm strength appears to be intact but patient does report that his strength has diminished and he is now has hardly has an ability to get up from the bed. Can start Neurontin if he has not been on it before. Also given urinary retention and lower extremity weakness obtain MRI lumbar spine. Other differential could include claudication patient has been followed by vascular his symptoms been ongoing for at least 3 months Last peripheral vascular catheterization was done in February 2023   He does have a 90% ostial left renal artery stenosis with hypertension which may need to be addressed in the future.  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  The sheath will be removed and pressure held.  He will be discharged home later today as an outpatient and will see me back in the office in 1 to 2 weeks in follow-up at which time I will refer him to Dr. Gae Gallop for consideration of common femoral endarterectomy.  Can discuss in the morning with vascular to see if his symptoms could be related to PAD

## 2022-05-13 NOTE — Assessment & Plan Note (Signed)
Progress Foley start on Flomax 0.4 mg p.o. daily  at bedtime If unable to urinate may need further urologic follow-up

## 2022-05-13 NOTE — H&P (Signed)
Demichael Traum [facility] ZOX:[id number] DOB: 1930/10/28 DOA: 05/13/2022     PCP: [name], MD   Outpatient Specialists:   CARDS:   Dr. Gwenlyn Found   NEurology  Dr.Willis   GI  Dr. Fuller Plan    Patient arrived to ER on 05/13/22 at 1444 Referred by Attending Noemi Chapel, MD   Patient coming from:    home Lives With family    Chief Complaint:   Chief Complaint  Patient presents with   Extremity Weakness    HPI: Nathan Santiago is a 88 y.o. male with medical history significant of PAD  HTN, HLD, CAD, PAD  Presented with generalized fatigue Progressive decline and lower ext weakness Had hard time emptying  Urinary retention Had a recent fall on plavix and had head CT which ws neg He is able to move his legs now no back pain  Has not been able to cooperate with PT OT Retaining urine  Reports tingling in his lower feet for the past few months  Pt states he tried to get up today but did not have the strength to ambulate   So nurse supervisor suggested he comes in  Does not smoke drinks 3 glasses of wine per week   Regarding pertinent Chronic problems:     Hyperlipidemia -  on statins Lipitor (atorvastatin)  Lipid Panel     Component Value Date/Time   CHOL 108 11/23/2016 0700   TRIG 94 11/23/2016 0700   HDL 45 11/23/2016 0700   CHOLHDL 2.4 11/23/2016 0700   VLDL 19 11/23/2016 0700   LDLCALC 44 11/23/2016 0700     HTN on NOrvasc HCTZ     CAD  - On Aspirin, statin,  Plavix                 -  followed by cardiology                08/05/2018 status post stenting of his proximal mid and distal RCA using overlapping stents as well as the proximal AV groove circumflex with a known occluded OM branch which is old.   PAD -distal abdominal aortic stent without a gradient and otherwise unchanged anatomy with a 95% calcified left common femoral artery stenosis and bilateral moderate SFA disease.     While in ER:   Noted to have  500 cc of urine in baldder and AKI  foley  placed    CXR -  NON acute  KUB - non obstructive  Following Medications were ordered in ER: Medications  sodium chloride 0.9 % bolus 500 mL (500 mLs Intravenous New Bag/Given 05/13/22 2115)    ________________    ED Triage Vitals  Enc Vitals Group     BP 05/13/22 1500 (!) 147/57     Pulse Rate 05/13/22 1500 74     Resp 05/13/22 1500 16     Temp 05/13/22 1500 97.8 F (36.6 C)     Temp Source 05/13/22 1500 Oral     SpO2 05/13/22 1500 95 %     Weight --      Height --      Head Circumference --      Peak Flow --      Pain Score 05/13/22 1505 6     Pain Loc --      Pain Edu? --      Excl. in [place]? --   TMAX(24)@     _________________________________________ Significant initial  Findings: Abnormal Labs Reviewed  COMPREHENSIVE  METABOLIC PANEL - Abnormal; Notable for the following components:      Result Value   Glucose, Bld 114 (*)    BUN 33 (*)    Creatinine, Ser 1.43 (*)    GFR, Estimated 47 (*)    All other components within normal limits  CBC WITH DIFFERENTIAL/PLATELET - Abnormal; Notable for the following components:   Hemoglobin 12.6 (*)    HCT 37.7 (*)    All other components within normal limits     _________________________ Troponin  8 -8 ECG: Ordered Personally reviewed and interpreted by me showing: HR : 73 Rhythm: Sinus rhythm Abnormal R-wave progression, early transition Nonspecific repol abnormality, diffuse leads Since last tracing T wave abnormality is now Present QTC 441   ____________________ This patient meets SIRS Criteria and may be septic.     The recent clinical data is shown below. Vitals:   05/13/22 2000 05/13/22 2003 05/13/22 2045 05/13/22 2100  BP: (!) 160/65  (!) 169/74 (!) 193/78  Pulse: 76  74 79  Resp: 14  14 (!) 21  Temp:  98.1 F (36.7 C)    TempSrc:      SpO2: 95%  95% 96%      WBC     Component Value Date/Time   WBC 9.5 05/13/2022 1510   LYMPHSABS 1.1 05/13/2022 1510   MONOABS 0.7 05/13/2022 1510   EOSABS 0.4  05/13/2022 1510   BASOSABS 0.0 05/13/2022 1510     UA   no evidence of UTI     Urine analysis:    Component Value Date/Time   COLORURINE YELLOW 05/13/2022 2106   APPEARANCEUR CLEAR 05/13/2022 2106   LABSPEC 1.019 05/13/2022 2106   PHURINE 5.0 05/13/2022 2106   GLUCOSEU NEGATIVE 05/13/2022 2106   HGBUR NEGATIVE 05/13/2022 2106   BILIRUBINUR NEGATIVE 05/13/2022 2106   KETONESUR NEGATIVE 05/13/2022 2106   PROTEINUR NEGATIVE 05/13/2022 2106   UROBILINOGEN 0.2 10/28/2010 0909   NITRITE NEGATIVE 05/13/2022 2106   LEUKOCYTESUR NEGATIVE 05/13/2022 2106     _________________________________ Hospitalist was called for admission for   Urinary retention  debility AKI   The following Work up has been ordered so far:  Orders Placed This Encounter  Procedures   Comprehensive metabolic panel   CBC with Differential   Magnesium   Urinalysis, Routine w reflex microscopic   Bladder scan   In and Out Cath   Consult to Transition of Care Team (SW and CM)   Consult to hospitalist   ED EKG   EKG 12-Lead     OTHER Significant initial  Findings:  labs showing:    Recent Labs  Lab 05/13/22 1510 05/13/22 1848  NA 143  --   K 4.0  --   CO2 28  --   GLUCOSE 114*  --   BUN 33*  --   CREATININE 1.43*  --   CALCIUM 9.3  --   MG  --  2.4    Cr    Up from baseline see below Lab Results  Component Value Date   CREATININE 1.43 (H) 05/13/2022   CREATININE 1.10 05/02/2022   CREATININE 1.07 09/26/2021    Recent Labs  Lab 05/13/22 1510  AST 22  ALT 21  ALKPHOS 70  BILITOT 0.7  PROT 6.7  ALBUMIN 3.5   Lab Results  Component Value Date   CALCIUM 9.3 05/13/2022    Plt: Lab Results  Component Value Date   PLT 245 05/13/2022    COVID-19 Labs  No  results for input(s): "DDIMER", "FERRITIN", "LDH", "CRP" in the last 72 hours.  Lab Results  Component Value Date   SARSCOV2NAA NEGATIVE 01/06/2021   SARSCOV2NAA NOT DETECTED 12/31/2018        Recent Labs  Lab  05/13/22 1510  WBC 9.5  NEUTROABS 7.3  HGB 12.6*  HCT 37.7*  MCV 85.1  PLT 245    HG/HCT  stable,       Component Value Date/Time   HGB 12.6 (L) 05/13/2022 1510   HGB 11.4 (L) 07/16/2018 1208   HCT 37.7 (L) 05/13/2022 1510   HCT 35.6 (L) 07/16/2018 1208   MCV 85.1 05/13/2022 1510   MCV 84 07/16/2018 1208      Cultures:    Component Value Date/Time   SDES BLOOD LEFT ANTECUBITAL 11/23/2016 0005   SPECREQUEST  11/23/2016 0005    BOTTLES DRAWN AEROBIC AND ANAEROBIC Blood Culture adequate volume   CULT  11/23/2016 0005    NO GROWTH 5 DAYS Performed at [facility], [address], [place], [place] [address]    REPTSTATUS 11/28/2016 FINAL 11/23/2016 0005     Radiological Exams on Admission: No results found. _______________________________________________________________________________________________________ Latest  Blood pressure (!) 193/78, pulse 79, temperature 98.1 F (36.7 C), resp. rate (!) 21, SpO2 96 %.   Vitals  labs and radiology finding personally reviewed  Review of Systems:    Pertinent positives include:   fatigue,   Constitutional:  No weight loss, night sweats, Fevers, chills,weight loss  HEENT:  No headaches, Difficulty swallowing,Tooth/dental problems,Sore throat,  No sneezing, itching, ear ache, nasal congestion, post nasal drip,  Cardio-vascular:  No chest pain, Orthopnea, PND, anasarca, dizziness, palpitations.no Bilateral lower extremity swelling  GI:  No heartburn, indigestion, abdominal pain, nausea, vomiting, diarrhea, change in bowel habits, loss of appetite, melena, blood in stool, hematemesis Resp:  no shortness of breath at rest. No dyspnea on exertion, No excess mucus, no productive cough, No non-productive cough, No coughing up of blood.No change in color of mucus.No wheezing. Skin:  no rash or lesions. No jaundice GU:  no dysuria, change in color of urine, no urgency or frequency. No straining to urinate.  No flank pain.   Musculoskeletal:  No joint pain or no joint swelling. No decreased range of motion. No back pain.  Psych:  No change in mood or affect. No depression or anxiety. No memory loss.  Neuro: no localizing neurological complaints, no tingling, no weakness, no double vision, no gait abnormality, no slurred speech, no confusion  All systems reviewed and apart from Clifford all are negative _______________________________________________________________________________________________ Past Medical History:   Past Medical History:  Diagnosis Date   AAA (abdominal aortic aneurysm) ([place])    asymptomatic   AAA (abdominal aortic aneurysm) ([place]) 2010   peripheral  angiogram-- bilateral SFA DISEASE  and 60 to 70% infrarenaal abd. aortic stenosis with 15 -mm gradient   Adenomatous colon polyp 11/2003   CAD (coronary artery disease)    Gait abnormality 02/13/2017   Gastroparesis    pt denies   GERD (gastroesophageal reflux disease)    w/ LPR   History of kidney stones    x2   Hyperlipidemia    Hypertension    IDA (iron deficiency anemia)    Peripheral arterial disease ([place])    RCEA  by Dr Lemar Livings   PUD (peptic ulcer disease)    pt unaware   Vertigo       Past Surgical History:  Procedure Laterality Date  ABDOMINAL AORTOGRAM W/LOWER EXTREMITY Bilateral 08/05/2018   Procedure: ABDOMINAL AORTOGRAM W/LOWER EXTREMITY;  Surgeon: Lorretta Harp, MD;  Location: [place] [facility];  Service: Cardiovascular;  Laterality: Bilateral;   ABDOMINAL AORTOGRAM W/LOWER EXTREMITY N/A 09/26/2021   Procedure: ABDOMINAL AORTOGRAM W/LOWER EXTREMITY;  Surgeon: Lorretta Harp, MD;  Location: [place] [facility];  Service: Cardiovascular;  Laterality: N/A;   AORTOGRAM  04/24/2016    Abdominal aortogram, bilateral iliac angiogram, bifemoral runoff   CARDIAC CATHETERIZATION  05/12/2005   RCA   carotid doppler  01/24/2013   RICA endarterectomy,left CCA 0-49%; left bulb and prox ICA 50-69%; bilaateral  subclavian < 50%   CAROTID ENDARTERECTOMY  11/01/2010   CATARACT EXTRACTION     COLONOSCOPY     CORONARY ANGIOPLASTY  05/18/2005   2 STENTS distal RCA AND PROXIMAL-MID RCA   5 total stents per pt.   CYSTOSCOPY/URETEROSCOPY/HOLMIUM LASER/STENT PLACEMENT Left 01/11/2018   Procedure: LEFT URETEROSCOPY/HOLMIUM LASER/STENT PLACEMENT;  Surgeon: Ardis Hughs, MD;  Location: [facility];  Service: Urology;  Laterality: Left;   DOPPLER ECHOCARDIOGRAPHY  02/07/2012   EF 55%,SHOWED NO ISCHEMIA    HERNIA REPAIR     umbilical   lower arterial  doppler  02/04/2013   aotra 1.5 x 1.5 cm; distal abd aorta 70-99%,proximal common iliac arteries -very stenotic with increased velocities>50%,may be falsely elevated as a result of residual plaque from the distal aorta stenosis   lower extremity doppler  June 18 ,2013   ABI'S ABNORMAL, RABI was 0.88 and LABI 0.75 ,with 3-vessel  run off   NM MYOVIEW LTD  MAY 23,2011   showed no significant ischemia;   NM MYOVIEW LTD  04/22/2008   ef 77%,exercise capcity 6 METS ,exaggerated blood pressure response to exercise   PERIPHERAL VASCULAR CATHETERIZATION N/A 04/24/2016   Procedure: Lower Extremity Angiography;  Surgeon: Lorretta Harp, MD;  Location: [place] [facility];  Service: Cardiovascular;  Laterality: N/A;   PERIPHERAL VASCULAR CATHETERIZATION  04/24/2016   Procedure: Peripheral Vascular Intervention;  Surgeon: Lorretta Harp, MD;  Location: [place] [facility];  Service: Cardiovascular;;  Aorta   retrograde central aortic catheterization  05/19/2005   cutting balloon atherectomy, c-circ stenosis with DES STENTING CYPHER   TONSILLECTOMY     TOTAL KNEE ARTHROPLASTY Left 01/03/2019   Procedure: TOTAL KNEE ARTHROPLASTY;  Surgeon: Netta Cedars, MD;  Location: [facility];  Service: Orthopedics;  Laterality: Left;  with IS block   TRANSURETHRAL RESECTION OF PROSTATE N/A 09/08/2016   Procedure: TRANSURETHRAL RESECTION OF THE PROSTATE (TURP);  Surgeon: Ardis Hughs, MD;   Location: [facility];  Service: Urology;  Laterality: N/A;   TRANSURETHRAL RESECTION OF PROSTATE     10-10-17  Dr. Louis Meckel   TRANSURETHRAL RESECTION OF PROSTATE N/A 10/10/2017   Procedure: TRANSURETHRAL RESECTION OF THE PROSTATE (TURP);  Surgeon: Ardis Hughs, MD;  Location: [facility];  Service: Urology;  Laterality: N/A;   VASECTOMY      Social History:  Ambulatory  walker  scooter     reports that he quit smoking about 53 years ago. His smoking use included cigarettes. He has a 40.00 pack-year smoking history. He has never used smokeless tobacco. He reports that he does not currently use alcohol after a past usage of about 4.0 standard drinks of alcohol per week. He reports that he does not use drugs.     Family History:   Family History  Problem Relation Age of Onset   Ovarian cancer Sister  Heart disease Father    Brain cancer Brother        Tumor   ______________________________________________________________________________________________ Allergies: Allergies  Allergen Reactions   Lisinopril Cough   Niaspan [Niacin Er] Rash     Prior to Admission medications   Medication Sig Start Date End Date Taking? Authorizing Provider  acetaminophen (TYLENOL) 500 MG tablet Take 1 tablet (500 mg total) by mouth 2 (two) times daily as needed. 01/13/19   Royal Hawthorn, NP  amLODipine (NORVASC) 5 MG tablet Take 5 mg by mouth daily.    [provider]  Apoaequorin (PREVAGEN PO) Take 1 tablet by mouth daily.    [provider]  aspirin EC 81 MG tablet Take 1 tablet (81 mg total) by mouth 2 (two) times a day. Patient taking differently: Take 81 mg by mouth daily. 01/03/19   Netta Cedars, MD  atenolol (TENORMIN) 50 MG tablet Take 50 mg by mouth daily.    [provider]  atorvastatin (LIPITOR) 40 MG tablet Take 40 mg by mouth daily at 6 PM.  11/14/16   [provider]  Biotin 10 MG CAPS Take 10 mg by mouth daily.    [provider]   cholecalciferol (VITAMIN D3) 25 MCG (1000 UT) tablet Take 1,000 Units by mouth daily.    [provider]  clopidogrel (PLAVIX) 75 MG tablet Take 75 mg by mouth every evening.     [provider]  diazepam (VALIUM) 5 MG tablet Take 2.5-5 mg by mouth every 8 (eight) hours as needed (vertigo). 01/21/18   [provider]  hydrochlorothiazide 25 MG tablet Take 25 mg by mouth daily.    [provider]  HYDROcodone bit-homatropine (HYCODAN) 5-1.5 MG/5ML syrup Take 5 mLs by mouth every 6 (six) hours as needed for cough. 11/29/20   [provider]  influenza vaccine adjuvanted (FLUAD QUADRIVALENT) 0.5 ML injection Inject into the muscle. 05/01/22   Carlyle Basques, MD  iron polysaccharides (NIFEREX) 150 MG capsule Take 150 mg by mouth 3 (three) times a week.    [provider]  losartan (COZAAR) 50 MG tablet Take 50 mg by mouth every evening.     [provider]  Magnesium 250 MG TABS Take 250 mg by mouth at bedtime.    [provider]  Misc Natural Products (GLUCOSAMINE CHONDROITIN TRIPLE) TABS Take 2 tablets by mouth daily.     [provider]  Multiple Vitamin (MULTIVITAMIN WITH MINERALS) TABS tablet Take 1 tablet by mouth daily.    [provider]  pantoprazole (PROTONIX) 40 MG tablet TAKE 1 TABLET BY MOUTH TWICE A DAY Patient taking differently: Take 40 mg by mouth 2 (two) times daily. 10/21/18   Collene Gobble, MD  potassium chloride SA (K-DUR,KLOR-CON) 20 MEQ tablet Take 20 mEq by mouth every evening.  07/16/13   [provider]  traMADol (ULTRAM) 50 MG tablet Take 50 mg by mouth every 6 (six) hours as needed for severe pain. 01/16/19   [provider]    ___________________________________________________________________________________________________ Physical Exam:    05/13/2022    9:00 PM 05/13/2022    8:45 PM 05/13/2022    8:00 PM  Vitals with BMI  Systolic [phone]  Diastolic 78  74 65  Pulse 79 74 76     1. General:  in No  Acute distress   Chronically ill   -appearing 2. Psychological: Alert and   Oriented 3. Head/ENT:   Dry Mucous Membranes  Head Non traumatic, neck supple                            Poor Dentition 4. SKIN:  decreased Skin turgor,  Skin clean Dry and intact no rash 5. Heart: Regular rate and rhythm no  Murmur, no Rub or gallop 6. Lungs:    no wheezes or crackles   7. Abdomen: Soft,  non-tender, Non distended   obese  bowel sounds present 8. Lower extremities: no clubbing, cyanosis, no  edema 9. Neurologically   strength 5 out of 5 in all 4 extremities cranial nerves II through XII intact 10. MSK: Normal range of motion    Chart has been reviewed  ______________________________________________________________________________________________  Assessment/Plan a 88 y.o. male with medical history significant of PAD  HTN, HLD, CAD, PAD   Admitted for   Urinary retention   Renal insufficiency     Present on Admission:  AKI (acute kidney injury) ([place])  Essential hypertension  HYPERCHOLESTEROLEMIA  GERD    AKI (acute kidney injury) ([place]) likely due to renal retention,  Place foley rehydrate Obtain KUB and renal US obtain urine elctrolytes  Essential hypertension Restart norvasc 5 mg po qday Atenolol 50 mg po qday  and hold cozaar and HCTZ  HYPERCHOLESTEROLEMIA Restart lipitor 40 mg po qday  GERD Cont potonix 40 mg po qday    Other plan as per orders.  DVT prophylaxis:  SCD      Code Status:    Code Status: Prior FULL CODE   as per patient  I had personally discussed CODE STATUS with patient    Family Communication:   Family not at  Bedside    Disposition Plan:     likely will need placement for rehabilitation                             Following barriers for discharge:                                                       Will need consultants to evaluate patient prior to discharge                       Would benefit from PT/OT eval prior to DC  Ordered                   Swallow eval - SLP ordered                                     Transition of care consulted                   Nutrition    consulted                     Consults called: none  Admission status:  ED Disposition     ED Disposition  Admit   Condition  --   Comment  The patient appears reasonably stabilized for admission considering the current resources, flow, and capabilities available in the ED at this time, and I doubt any other [facility] requiring further screening and/or treatment in  the ED prior to admission is  present.           Obs     Level of care     tele  For 24H    igation given very high risk for false native test result    Arshawn Valdez 05/13/2022, 11:09 PM    [facility]     after 2 AM please page floor coverage PA If 7AM-7PM, please contact the day team taking care of the patient using [email or web address]   Patient was evaluated in the context of the global COVID-19 pandemic, which necessitated consideration that the patient might be at risk for infection with the SARS-CoV-2 virus that causes COVID-19. Institutional protocols and algorithms that pertain to the evaluation of patients at risk for COVID-19 are in a state of rapid change based on information released by regulatory bodies including the CDC and federal and state organizations. These policies and algorithms were followed during the patient's care.

## 2022-05-14 ENCOUNTER — Observation Stay (HOSPITAL_COMMUNITY): Payer: Medicare Other

## 2022-05-14 DIAGNOSIS — I10 Essential (primary) hypertension: Secondary | ICD-10-CM | POA: Diagnosis not present

## 2022-05-14 DIAGNOSIS — I739 Peripheral vascular disease, unspecified: Secondary | ICD-10-CM | POA: Diagnosis not present

## 2022-05-14 DIAGNOSIS — N179 Acute kidney failure, unspecified: Secondary | ICD-10-CM | POA: Diagnosis not present

## 2022-05-14 DIAGNOSIS — R338 Other retention of urine: Secondary | ICD-10-CM | POA: Diagnosis not present

## 2022-05-14 LAB — COMPREHENSIVE METABOLIC PANEL
ALT: 21 U/L (ref 0–44)
AST: 19 U/L (ref 15–41)
Albumin: 3.2 g/dL — ABNORMAL LOW (ref 3.5–5.0)
Alkaline Phosphatase: 64 U/L (ref 38–126)
Anion gap: 8 (ref 5–15)
BUN: 24 mg/dL — ABNORMAL HIGH (ref 8–23)
CO2: 26 mmol/L (ref 22–32)
Calcium: 8.6 mg/dL — ABNORMAL LOW (ref 8.9–10.3)
Chloride: 106 mmol/L (ref 98–111)
Creatinine, Ser: 1.14 mg/dL (ref 0.61–1.24)
GFR, Estimated: >60 mL/min (ref >60)
Glucose, Bld: 90 mg/dL (ref 70–99)
Potassium: 3.2 mmol/L — ABNORMAL LOW (ref 3.5–5.1)
Sodium: 140 mmol/L (ref 135–145)
Total Bilirubin: 0.8 mg/dL (ref 0.3–1.2)
Total Protein: 6.1 g/dL — ABNORMAL LOW (ref 6.5–8.1)

## 2022-05-14 LAB — IRON AND TIBC
Iron: 63 ug/dL (ref 45–182)
Saturation Ratios: 20 % (ref 17.9–39.5)
TIBC: 323 ug/dL (ref 250–450)
UIBC: 260 ug/dL

## 2022-05-14 LAB — FERRITIN: Ferritin: 23 ng/mL — ABNORMAL LOW (ref 24–336)

## 2022-05-14 LAB — MAGNESIUM: Magnesium: 2.2 mg/dL (ref 1.7–2.4)

## 2022-05-14 LAB — CBC
HCT: 39.4 % (ref 39.0–52.0)
Hemoglobin: 12.2 g/dL — ABNORMAL LOW (ref 13.0–17.0)
MCH: 27.7 pg (ref 26.0–34.0)
MCHC: 31 g/dL (ref 30.0–36.0)
MCV: 89.5 fL (ref 80.0–100.0)
Platelets: 190 10*3/uL (ref 150–400)
RBC: 4.4 MIL/uL (ref 4.22–5.81)
RDW: 14.1 % (ref 11.5–15.5)
WBC: 7.5 10*3/uL (ref 4.0–10.5)
nRBC: 0 % (ref 0.0–0.2)

## 2022-05-14 LAB — URINALYSIS, COMPLETE (UACMP) WITH MICROSCOPIC
Bacteria, UA: NONE SEEN
Bilirubin Urine: NEGATIVE
Glucose, UA: NEGATIVE mg/dL
Hgb urine dipstick: NEGATIVE
Ketones, ur: 5 mg/dL — AB
Leukocytes,Ua: NEGATIVE
Nitrite: NEGATIVE
Protein, ur: NEGATIVE mg/dL
Specific Gravity, Urine: 1.019 (ref 1.005–1.030)
pH: 5 (ref 5.0–8.0)

## 2022-05-14 LAB — SARS CORONAVIRUS 2 BY RT PCR: SARS Coronavirus 2 by RT PCR: NEGATIVE

## 2022-05-14 LAB — CK: Total CK: 50 U/L (ref 49–397)

## 2022-05-14 LAB — RETICULOCYTES
Immature Retic Fract: 2.7 % (ref 2.3–15.9)
RBC.: 4.4 MIL/uL (ref 4.22–5.81)
Retic Count, Absolute: 40 10*3/uL (ref 19.0–186.0)
Retic Ct Pct: 0.9 % (ref 0.4–3.1)

## 2022-05-14 LAB — AMMONIA: Ammonia: 18 umol/L (ref 9–35)

## 2022-05-14 LAB — HEPATIC FUNCTION PANEL
ALT: 22 U/L (ref 0–44)
AST: 17 U/L (ref 15–41)
Albumin: 3.3 g/dL — ABNORMAL LOW (ref 3.5–5.0)
Alkaline Phosphatase: 66 U/L (ref 38–126)
Bilirubin, Direct: 0.1 mg/dL (ref 0.0–0.2)
Indirect Bilirubin: 0.8 mg/dL (ref 0.3–0.9)
Total Bilirubin: 0.9 mg/dL (ref 0.3–1.2)
Total Protein: 6.3 g/dL — ABNORMAL LOW (ref 6.5–8.1)

## 2022-05-14 LAB — PROTIME-INR
INR: 1.1 (ref 0.8–1.2)
Prothrombin Time: 13.8 seconds (ref 11.4–15.2)

## 2022-05-14 LAB — PHOSPHORUS
Phosphorus: 3.2 mg/dL (ref 2.5–4.6)
Phosphorus: 2.7 mg/dL (ref 2.5–4.6)

## 2022-05-14 LAB — CREATININE, URINE, RANDOM: Creatinine, Urine: 118 mg/dL

## 2022-05-14 LAB — PREALBUMIN: Prealbumin: 25 mg/dL (ref 18–38)

## 2022-05-14 LAB — VITAMIN B12: Vitamin B-12: 571 pg/mL (ref 180–914)

## 2022-05-14 LAB — TSH: TSH: 1.104 u[IU]/mL (ref 0.350–4.500)

## 2022-05-14 LAB — LACTIC ACID, PLASMA
Lactic Acid, Venous: 0.9 mmol/L (ref 0.5–1.9)
Lactic Acid, Venous: 0.9 mmol/L (ref 0.5–1.9)

## 2022-05-14 LAB — FOLATE: Folate: 32.5 ng/mL (ref >5.9)

## 2022-05-14 LAB — SODIUM, URINE, RANDOM: Sodium, Ur: 151 mmol/L

## 2022-05-14 MED ORDER — ATENOLOL 25 MG PO TABS
50.0000 mg | ORAL_TABLET | Freq: Every day | ORAL | Status: DC
  Administered 2022-05-14 – 2022-05-16 (×3): 50 mg via ORAL
  Filled 2022-05-14 (×3): qty 2

## 2022-05-14 MED ORDER — SODIUM CHLORIDE 0.9 % IV SOLN: INTRAVENOUS | Status: DC

## 2022-05-14 MED ORDER — POTASSIUM CHLORIDE CRYS ER 20 MEQ PO TBCR
40.0000 meq | EXTENDED_RELEASE_TABLET | Freq: Once | ORAL | Status: AC
  Administered 2022-05-14: 40 meq via ORAL
  Filled 2022-05-14: qty 2

## 2022-05-14 MED ORDER — PANTOPRAZOLE SODIUM 40 MG PO TBEC
40.0000 mg | DELAYED_RELEASE_TABLET | Freq: Two times a day (BID) | ORAL | Status: DC
  Administered 2022-05-14 – 2022-05-16 (×5): 40 mg via ORAL
  Filled 2022-05-14 (×5): qty 1

## 2022-05-14 MED ORDER — ASPIRIN 81 MG PO TBEC
81.0000 mg | DELAYED_RELEASE_TABLET | Freq: Every day | ORAL | Status: DC
  Administered 2022-05-14 – 2022-05-16 (×3): 81 mg via ORAL
  Filled 2022-05-14 (×3): qty 1

## 2022-05-14 MED ORDER — CLOPIDOGREL BISULFATE 75 MG PO TABS
75.0000 mg | ORAL_TABLET | Freq: Every evening | ORAL | Status: DC
  Administered 2022-05-14 – 2022-05-15 (×2): 75 mg via ORAL
  Filled 2022-05-14 (×2): qty 1

## 2022-05-14 MED ORDER — ATORVASTATIN CALCIUM 40 MG PO TABS
40.0000 mg | ORAL_TABLET | Freq: Every day | ORAL | Status: DC
  Administered 2022-05-14 – 2022-05-15 (×2): 40 mg via ORAL
  Filled 2022-05-14 (×2): qty 1

## 2022-05-14 MED ORDER — LOSARTAN POTASSIUM 25 MG PO TABS
25.0000 mg | ORAL_TABLET | Freq: Every day | ORAL | Status: DC
  Administered 2022-05-14: 25 mg via ORAL
  Filled 2022-05-14 (×2): qty 1

## 2022-05-14 MED ORDER — ACETAMINOPHEN 650 MG RE SUPP: 650.0000 mg | Freq: Four times a day (QID) | RECTAL | Status: DC | PRN

## 2022-05-14 MED ORDER — HYDROCODONE-ACETAMINOPHEN 5-325 MG PO TABS
1.0000 | ORAL_TABLET | ORAL | Status: DC | PRN
  Administered 2022-05-14: 1 via ORAL
  Filled 2022-05-14: qty 1

## 2022-05-14 MED ORDER — TRAMADOL HCL 50 MG PO TABS
50.0000 mg | ORAL_TABLET | Freq: Four times a day (QID) | ORAL | Status: DC | PRN
  Administered 2022-05-15: 50 mg via ORAL
  Filled 2022-05-14: qty 1

## 2022-05-14 MED ORDER — ACETAMINOPHEN 325 MG PO TABS: 650.0000 mg | ORAL_TABLET | Freq: Four times a day (QID) | ORAL | Status: DC | PRN

## 2022-05-14 MED ORDER — AMLODIPINE BESYLATE 5 MG PO TABS
5.0000 mg | ORAL_TABLET | Freq: Every day | ORAL | Status: DC
  Administered 2022-05-14: 5 mg via ORAL
  Filled 2022-05-14 (×2): qty 1

## 2022-05-14 NOTE — Progress Notes (Signed)
PT Cancellation Note  Patient Details Name: Nathan Santiago MRN: [id number] DOB: 01/07/1931   Cancelled Treatment:    Reason Eval/Treat Not Completed: Patient not medically ready  Noted BP 195/75 with goal SBP <160. Will hold until after 10:00 meds as activity will further incr BP. Noted plan is for SNF and will see as soon as medically appropriate.    [place]  Office [phone]  Rexanne Mano 05/14/2022, 8:24 AM

## 2022-05-14 NOTE — ED Notes (Signed)
Pt requesting urinal, provided. Pt informed that if he has not voided by 9a, we will bladder scan and possibly cath.

## 2022-05-14 NOTE — Evaluation (Signed)
Physical Therapy Evaluation Patient Details Name: Nathan Santiago MRN: [id number] DOB: March 23, 1931 Today's Date: 05/14/2022  History of Present Illness  88 y.o. male Presented with generalized fatigue and lower extremity weakness. Had recent fall. PMH significant of HTN, HLD, CAD, PAD  Clinical Impression   Pt admitted secondary to problem above with deficits below. PTA patient was living with wife in [place] ([place]). He was using rollator inside the home and electric scooter when going outside.  Pt currently requires min assist for standing and bedside and did not ambulate due to increasing dizziness and posterior lean in standing. Patient has had recent fall at home and was unable to get off the floor. Recommend return to skilled nursing portion of [facility] for continued rehab. Anticipate patient will benefit from PT to address problems listed below. Will continue to follow acutely to maximize functional mobility independence and safety.          Recommendations for follow up therapy are one component of a multi-disciplinary discharge planning process, led by the attending physician.  Recommendations may be updated based on patient status, additional functional criteria and insurance authorization.  Follow Up Recommendations Skilled nursing-short term rehab (<3 hours/day) Can patient physically be transported by private vehicle: No    Assistance Recommended at Discharge Frequent or constant Supervision/Assistance  Patient can return home with the following  Two people to help with walking and/or transfers;Assistance with cooking/housework;Assist for transportation;Direct supervision/assist for medications management;Direct supervision/assist for financial management;Help with stairs or ramp for entrance    Equipment Recommendations None recommended by PT  Recommendations for Other Services       Functional Status Assessment Patient has had a recent decline in  their functional status and demonstrates the ability to make significant improvements in function in a reasonable and predictable amount of time.     Precautions / Restrictions Precautions Precautions: Fall Precaution Comments: recent fall at home      Mobility  Bed Mobility Overal bed mobility: Needs Assistance Bed Mobility: Supine to Sit, Sit to Supine     Supine to sit: Min guard, HOB elevated Sit to supine: Min guard, HOB elevated   General bed mobility comments: on ED stretcher with HOB elevated; incr time and near need for assist during transitions    Transfers Overall transfer level: Needs assistance Equipment used: Rolling walker (2 wheels) Transfers: Sit to/from Stand Sit to Stand: Min assist           General transfer comment: posterior lean    Ambulation/Gait               General Gait Details: deferred due to patient's report of increasing dizziness with incr time standing (not officially orthostatic, but BP did drop in standing)  Stairs            Wheelchair Mobility    Modified Rankin (Stroke Patients Only)       Balance Overall balance assessment: Needs assistance Sitting-balance support: Bilateral upper extremity supported, Feet unsupported Sitting balance-Leahy Scale: Poor     Standing balance support: Bilateral upper extremity supported, Reliant on assistive device for balance Standing balance-Leahy Scale: Poor Standing balance comment: posterior lean                             Pertinent Vitals/Pain Pain Assessment Pain Assessment: No/denies pain    Home Living Family/patient expects to be discharged to:: Skilled nursing facility  Prior Function Prior Level of Function : Independent/Modified Independent             Mobility Comments: normally uses rollator inside home and electric scooter if going out (ex. to [employer])       Hand Dominance         Extremity/Trunk Assessment   Upper Extremity Assessment Upper Extremity Assessment: Defer to OT evaluation    Lower Extremity Assessment Lower Extremity Assessment: RLE deficits/detail;LLE deficits/detail RLE Deficits / Details: hip flexion 4/5, hip extension 3+, knee extension 4/5 LLE Deficits / Details: hip flexion 4/5, hip extension 3+, knee extension 4/5    Cervical / Trunk Assessment Cervical / Trunk Assessment: Normal  Communication   Communication: No difficulties  Cognition Arousal/Alertness: Awake/alert Behavior During Therapy: WFL for tasks assessed/performed Overall Cognitive Status: Impaired/Different from baseline Area of Impairment: Orientation, Attention, Problem solving                 Orientation Level: Place, Time Current Attention Level: Sustained         Problem Solving: Slow processing, Requires verbal cues General Comments: Repeatedly thought he was at [place] despite re-orientation; easily distracted by lines or internally distracted        General Comments      Exercises     Assessment/Plan    PT Assessment Patient needs continued PT services  PT Problem List Decreased strength;Decreased activity tolerance;Decreased balance;Decreased mobility;Decreased cognition;Decreased knowledge of use of DME;Decreased knowledge of precautions;Cardiopulmonary status limiting activity       PT Treatment Interventions DME instruction;Gait training;Functional mobility training;Therapeutic activities;Therapeutic exercise;Balance training;Cognitive remediation;Patient/family education    PT Goals (Current goals can be found in the Care Plan section)  Acute Rehab PT Goals Patient Stated Goal: get stronger and not fall PT Goal Formulation: With patient Time For Goal Achievement: 05/28/22 Potential to Achieve Goals: Good    Frequency Min 2X/week     Co-evaluation               AM-PAC PT "6 Clicks" Mobility  Outcome Measure Help needed  turning from your back to your side while in a flat bed without using bedrails?: None Help needed moving from lying on your back to sitting on the side of a flat bed without using bedrails?: A Little Help needed moving to and from a bed to a chair (including a wheelchair)?: A Little Help needed standing up from a chair using your arms (e.g., wheelchair or bedside chair)?: A Little Help needed to walk in hospital room?: Total Help needed climbing 3-5 steps with a railing? : Total 6 Click Score: 15    End of Session Equipment Utilized During Treatment: Gait belt Activity Tolerance: Other (comment) (limited by dizziness) Patient left: in bed;with call bell/phone within reach (on ED stretcher)   PT Visit Diagnosis: Unsteadiness on feet (R26.81);History of falling (Z91.81)    Time: 1950-9326 PT Time Calculation (min) (ACUTE ONLY): 21 min   Charges:   PT Evaluation $PT Eval Moderate Complexity: [place], PT Acute Rehabilitation Services  Office [phone]   Rexanne Mano 05/14/2022, 12:14 PM

## 2022-05-14 NOTE — ED Notes (Signed)
Patient transported to MRI 

## 2022-05-14 NOTE — Progress Notes (Signed)
SLP Cancellation Note  Patient Details Name: Nathan Santiago MRN: [id number] DOB: 03-06-1931   Cancelled treatment:       Reason Eval/Treat Not Completed: SLP screened, no needs identified, will sign off. Patient denies h/o or current dysphagia and RN has not observed any overt s/s dysphagia.   Sonia Baller, MA, CCC-SLP Speech Therapy

## 2022-05-14 NOTE — TOC Initial Note (Signed)
Transition of Care [facility]) - Initial/Assessment Note    Patient Details  Name: Nathan Santiago MRN: [id number] Date of Birth: 10-Sep-1930  [facility] CM/SW Contact:    Coralee Pesa, [place] Phone Number: 05/14/2022, 2:47 PM  Clinical Narrative:                 CSW spoke with pt's spouse who confirmed they currently live at [facility] in the [place] living. Spouse notes she and pt are agreeable to SNF placement and she provided the number for [facility] ([phone], the weekend RN in admissions. [facility] Panning confirmed they have a bed for pt and will need an FL2 and DC summary, will not require an authorization. She provided the weekday admission social worker's number. Butch Penny [phone]. CSW to complete FL2 and send in hub at facilities request. TOC will continue to follow for DC needs.  Expected Discharge Plan: Skilled Nursing Facility Barriers to Discharge: Continued Medical Work up   Patient Goals and CMS Choice     Choice offered to / list presented to : Patient, Spouse  Expected Discharge Plan and Services Expected Discharge Plan: [place] Acute Care Choice: [place] Living arrangements for the past 2 months: [place]                                      Prior Living Arrangements/Services Living arrangements for the past 2 months: [place] Lives with:: Spouse Patient language and need for interpreter reviewed:: Yes Do you feel safe going back to the place where you live?: Yes      Need for Family Participation in Patient Care: Yes (Comment) Care giver support system in place?: Yes (comment)   Criminal Activity/Legal Involvement Pertinent to Current Situation/Hospitalization: No - Comment as needed  Activities of Daily Living      Permission Sought/Granted Permission sought to share information with : Family Supports Permission granted to share information with : Yes,  Verbal Permission Granted  Share Information with NAME: [name]     Permission granted to share info w Relationship: Spouse     Emotional Assessment Appearance:: Appears stated age Attitude/Demeanor/Rapport: Engaged Affect (typically observed): Appropriate Orientation: : Oriented to Self, Oriented to Place, Oriented to  Time Alcohol / Substance Use: Not Applicable Psych Involvement: No (comment)  Admission diagnosis:  AKI (acute kidney injury) ([place]) [N17.9] Patient Active Problem List   Diagnosis Date Noted   AKI (acute kidney injury) ([place]) 05/13/2022   Neuropathy 05/13/2022   Acute urinary retention 05/13/2022   Status post total knee replacement, left 01/03/2019   Pain in left knee 08/05/2018   Pain in right knee 08/05/2018   Chronic cough 04/15/2018   Foreign body in ear 12/21/2017   Hematuria 10/10/2017   Gait abnormality 02/13/2017   UTI (urinary tract infection) 11/22/2016   Vertigo 11/22/2016   Hypokalemia 11/22/2016   Urinary frequency 09/08/2016   Claudication ([place]) 04/24/2016   Sensorineural hearing loss (SNHL), bilateral 03/09/2016   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   Bilateral carotid artery disease ([place]) 08/07/2014   Peripheral arterial disease ([place]) 08/06/2013   GASTROPARESIS 02/25/2009   COLONIC POLYPS, ADENOMATOUS, HX OF 02/22/2009   HYPERCHOLESTEROLEMIA 09/26/2007   Essential hypertension 09/26/2007   Coronary atherosclerosis 09/26/2007   Allergic rhinitis 09/26/2007   GERD 09/26/2007   Peptic ulcer 86/57/8469   UMBILICAL HERNIA  09/26/2007   NEPHROLITHIASIS 09/26/2007   BLADDER STONE 09/26/2007   PCP:  [name], MD Pharmacy:   [id number] [place] [place] [id number]: [phone]: [phone] [address] [place] [address]Phone:  [phone]: [phone]    Social Determinants of Health (SDOH) Interventions    Readmission Risk Interventions     No data to display

## 2022-05-14 NOTE — Progress Notes (Signed)
PROGRESS NOTE  Avrohom Mckelvin [facility] ZOX:[id number] DOB: 07-Dec-1930 DOA: 05/13/2022 PCP: [name], MD  HPI/Recap of past 24 hours: Nathan Santiago is a 88 y.o. male with medical history significant of PAD, HTN, HLD, CAD, PAD presented with generalized fatigue with lower ext weakness/tingling and urinary retention. Pt states he tried to get up, but did not have the strength to ambulate so nurse supervisor suggested to come to the ED, for further management.  Of note, [facility].  In the ED, noted to be retaining urine.  Patient noted to have AKI.  PT/OT consulted.  Patient admitted for further management     Today, patient noted to be slightly confused, otherwise denies any new complaints except for urinary retention.   Assessment/Plan: Principal Problem:   AKI (acute kidney injury) ([place]) Active Problems:   HYPERCHOLESTEROLEMIA   Essential hypertension   Coronary atherosclerosis   GERD   Peripheral arterial disease ([facility])   Neuropathy   Acute urinary retention   AKI Acute urinary retention Creatinine resolved Ultrasound renal with mild right hydronephrosis S/p I's and O's-May need Foley catheter insertion S/p IV fluids Daily BMP  Hypokalemia Replace as needed  Hypertension BP somewhat controlled Continue Norvasc, atenolol, started cozaar at a lower dose Continue to hydrochlorothiazide, discontinue upon discharge  HLD Continue statins  GERD Continue PPI  CAD/PAD Continue aspirin, statin, Plavix       Estimated body mass index is 28.15 kg/m as calculated from the following:   Height as of this encounter: '5\' 4"'$  (1.626 m).   Weight as of this encounter: 74.4 kg.     Code Status: Full  Family Communication: None at bedside  Disposition Plan: Status is: Observation The patient remains OBS appropriate and will d/c before 2  midnights.      Consultants: None  Procedures: None  Antimicrobials: None  DVT prophylaxis: SCDs   Objective: Vitals:   05/14/22 1100 05/14/22 1150 05/14/22 1200 05/14/22 1300  BP: 134/63  (!) 170/59 (!) 153/95  Pulse: 61  (!) 58 69  Resp: 16  17 (!) 26  Temp:  97.6 F (36.4 C)    TempSrc:  Oral    SpO2: 96%  92% 94%  Weight:      Height:        Intake/Output Summary (Last 24 hours) at 05/14/2022 1340 Last data filed at 05/14/2022 0846 Gross per 24 hour  Intake 500 ml  Output 625 ml  Net -125 ml   Filed Weights   05/14/22 0738  Weight: 74.4 kg    Exam: General: NAD  Cardiovascular: S1, S2 present Respiratory: CTAB Abdomen: Soft, nontender, nondistended, bowel sounds present Musculoskeletal: No bilateral pedal edema noted Skin: Normal Psychiatry: Normal mood     Data Reviewed: CBC: Recent Labs  Lab 05/13/22 1510 05/13/22 2341 05/14/22 0452  WBC 9.5  --  7.5  NEUTROABS 7.3  --   --   HGB 12.6* 11.6* 12.2*  HCT 37.7* 34.0* 39.4  MCV 85.1  --  89.5  PLT 245  --  811   Basic Metabolic Panel: Recent Labs  Lab 05/13/22 1510 05/13/22 1848 05/13/22 2334 05/13/22 2341 05/14/22 0452  NA 143  --   --  142 140  K 4.0  --   --  3.4* 3.2*  CL 108  --   --   --  106  CO2 28  --   --   --  26  GLUCOSE 114*  --   --   --  90  BUN 33*  --   --   --  24*  CREATININE 1.43*  --   --   --  1.14  CALCIUM 9.3  --   --   --  8.6*  MG  --  2.4  --   --  2.2  PHOS  --   --  3.2  --  2.7   GFR: Estimated Creatinine Clearance: 40.6 mL/min (by C-G formula based on SCr of 1.14 mg/dL). Liver Function Tests: Recent Labs  Lab 05/13/22 1510 05/13/22 2334 05/14/22 0452  AST '22 17 19  '$ ALT '21 22 21  '$ ALKPHOS 70 66 64  BILITOT 0.7 0.9 0.8  PROT 6.7 6.3* 6.1*  ALBUMIN 3.5 3.3* 3.2*   No results for input(s): "LIPASE", "AMYLASE" in the last 168 hours. Recent Labs  Lab 05/13/22 2334  AMMONIA 18   Coagulation Profile: Recent Labs  Lab 05/13/22 2334   INR 1.1   Cardiac Enzymes: Recent Labs  Lab 05/13/22 2334  CKTOTAL 50   BNP (last 3 results) No results for input(s): "PROBNP" in the last 8760 hours. HbA1C: No results for input(s): "HGBA1C" in the last 72 hours. CBG: No results for input(s): "GLUCAP" in the last 168 hours. Lipid Profile: No results for input(s): "CHOL", "HDL", "LDLCALC", "TRIG", "CHOLHDL", "LDLDIRECT" in the last 72 hours. Thyroid Function Tests: Recent Labs    05/13/22 2334  TSH 1.104   Anemia Panel: Recent Labs    05/13/22 2334  VITAMINB12 571  FOLATE 32.5  FERRITIN 23*  TIBC 323  IRON 63  RETICCTPCT 0.9   Urine analysis:    Component Value Date/Time   COLORURINE YELLOW 05/14/2022 0846   APPEARANCEUR CLEAR 05/14/2022 0846   LABSPEC 1.019 05/14/2022 0846   PHURINE 5.0 05/14/2022 0846   GLUCOSEU NEGATIVE 05/14/2022 0846   HGBUR NEGATIVE 05/14/2022 0846   BILIRUBINUR NEGATIVE 05/14/2022 0846   KETONESUR 5 (A) 05/14/2022 0846   PROTEINUR NEGATIVE 05/14/2022 0846   UROBILINOGEN 0.2 10/28/2010 0909   NITRITE NEGATIVE 05/14/2022 0846   LEUKOCYTESUR NEGATIVE 05/14/2022 0846   Sepsis Labs: '@LABRCNTIP'$ (procalcitonin:4,lacticidven:4)  ) Recent Results (from the past 240 hour(s))  SARS Coronavirus 2 by RT PCR (hospital order, performed in [place] hospital lab) *cepheid single result test* Anterior Nasal Swab     Status: None   Collection Time: 05/14/22  1:08 AM   Specimen: Anterior Nasal Swab  Result Value Ref Range Status   SARS Coronavirus 2 by RT PCR NEGATIVE NEGATIVE Final    Comment: (NOTE) SARS-CoV-2 target nucleic acids are NOT DETECTED.  The SARS-CoV-2 RNA is generally detectable in upper and lower respiratory specimens during the acute phase of infection. The lowest concentration of SARS-CoV-2 viral copies this assay can detect is 250 copies / mL. A negative result does not preclude SARS-CoV-2 infection and should not be used as the sole basis for treatment or other patient  management decisions.  A negative result may occur with improper specimen collection / handling, submission of specimen other than nasopharyngeal swab, presence of viral mutation(s) within the areas targeted by this assay, and inadequate number of viral copies (<250 copies / mL). A negative result must be combined with clinical observations, patient history, and epidemiological information.  Fact Sheet for Patients:   https://www.patel.info/  Fact Sheet for Healthcare Providers: https://hall.com/  This test is not yet approved or  cleared by the [place] FDA and has been authorized for detection and/or diagnosis of SARS-CoV-2 by FDA under an Emergency Use  Authorization (EUA).  This EUA will remain in effect (meaning this test can be used) for the duration of the COVID-19 declaration under Section 564(b)(1) of the Act, 21 U.S.C. section 360bbb-3(b)(1), unless the authorization is terminated or revoked sooner.  Performed at [facility], [place] [address], [place], [place] [address]       Studies: MR LUMBAR SPINE WO CONTRAST  Result Date: 05/14/2022 CLINICAL DATA:  Lumbar radiculopathy EXAM: MRI LUMBAR SPINE WITHOUT CONTRAST TECHNIQUE: Multiplanar, multisequence MR imaging of the lumbar spine was performed. No intravenous contrast was administered. COMPARISON:  None Available. FINDINGS: Segmentation:  Standard. Alignment:  Physiologic. Vertebrae: Low STIR signal, hyperintense T1/T2-weighted signal within the L1 vertebral body with approximately 25% height loss. Otherwise normal vertebrae. Conus medullaris and cauda equina: Conus extends to the L1 level. Conus and cauda equina appear normal. Paraspinal and other soft tissues: Progression of left renal atrophy Disc levels: T12-L1: Small central disc protrusion without stenosis L1-L2: Small central disc protrusion. No spinal canal stenosis. No neural foraminal stenosis. L2-L3: Small disc  bulge. No spinal canal stenosis. No neural foraminal stenosis. L3-L4: Minimal disc bulge. No spinal canal stenosis. No neural foraminal stenosis. L4-L5: Moderate facet hypertrophy and small disc bulge. No spinal canal stenosis. No neural foraminal stenosis. L5-S1: Disc space narrowing with left asymmetric bulge. No spinal canal stenosis. Left neural foraminal stenosis. Visualized sacrum: Normal. IMPRESSION: 1. L1 height loss without bone marrow edema. This may be a non acute compression fracture in the setting of hemangioma. 2. Mild left L5-S1 neural foraminal stenosis. 3. Unchanged moderate L4-5 facet arthrosis, which may serve as a source of local low back pain. Electronically Signed   By: Ulyses Jarred M.D.   On: 05/14/2022 02:21   US RENAL  Result Date: 05/14/2022 CLINICAL DATA:  Acute kidney injury EXAM: RENAL / URINARY TRACT ULTRASOUND COMPLETE COMPARISON:  02/15/2018 FINDINGS: Right Kidney: Renal measurements: 10.9 x 5.2 x 7.2 cm = volume: 212 mL. Normal echotexture. Mild right hydronephrosis. No mass. Left Kidney: Renal measurements: 9.7 x 4.8 x 5.1 cm = volume: 123 mL. 1.4 cm cyst in the midpole. Normal echotexture. No hydronephrosis. Bladder: Appears normal for degree of bladder distention. Other: None. IMPRESSION: Mild right hydronephrosis. Electronically Signed   By: Rolm Baptise M.D.   On: 05/14/2022 00:40   DG Chest Port 1 View  Result Date: 05/13/2022 CLINICAL DATA:  Weight loss, weakness.  Fall 1 week ago. EXAM: ABDOMEN - 1 VIEW; PORTABLE CHEST - 1 VIEW COMPARISON:  01/25/2018, 05/02/2022. FINDINGS: The heart and mediastinal contour are stable. There is atherosclerotic calcification of the aorta. No consolidation, effusion, or pneumothorax. No acute osseous abnormality. The bowel gas pattern is normal. A vascular stent is present over the lumbar spine. No radio-opaque calculi. Degenerative changes are present in the thoracolumbar spine. IMPRESSION: 1. No cardiopulmonary process. 2.  Nonobstructive bowel-gas pattern. Electronically Signed   By: Brett Fairy M.D.   On: 05/13/2022 22:34   DG Abd 1 View  Result Date: 05/13/2022 CLINICAL DATA:  Weight loss, weakness.  Fall 1 week ago. EXAM: ABDOMEN - 1 VIEW; PORTABLE CHEST - 1 VIEW COMPARISON:  01/25/2018, 05/02/2022. FINDINGS: The heart and mediastinal contour are stable. There is atherosclerotic calcification of the aorta. No consolidation, effusion, or pneumothorax. No acute osseous abnormality. The bowel gas pattern is normal. A vascular stent is present over the lumbar spine. No radio-opaque calculi. Degenerative changes are present in the thoracolumbar spine. IMPRESSION: 1. No cardiopulmonary process. 2. Nonobstructive bowel-gas pattern. Electronically  Signed   By: Brett Fairy M.D.   On: 05/13/2022 22:34    Scheduled Meds:  amLODipine  5 mg Oral Daily   aspirin EC  81 mg Oral Daily   atenolol  50 mg Oral Daily   atorvastatin  40 mg Oral q1800   clopidogrel  75 mg Oral QPM   gabapentin  200 mg Oral QHS   pantoprazole  40 mg Oral BID   tamsulosin  0.4 mg Oral QPC supper    Continuous Infusions:   LOS: 0 days     Alma Friendly, MD [facility]  If 7PM-7AM, please contact night-coverage [email or web address] 05/14/2022, 1:40 PM

## 2022-05-14 NOTE — NC FL2 (Signed)
[place] LEVEL OF CARE SCREENING TOOL     IDENTIFICATION  Patient Name: Nathan Santiago Birthdate: 27-Aug-1930 Sex: male Admission Date (Current Location): 05/13/2022  [facility] and [place] Number:  Herbalist and Address:  [facility], [place] [address], [place], [place] [address]      Provider Number: [id number]  Attending Physician Name and Address:  Alma Friendly, MD  Relative Name and Phone Number:  Phillips Goulette [phone]    Current Level of Care: Hospital Recommended Level of Care: [place] Prior Approval Number:    Date Approved/Denied:   PASRR Number:    Discharge Plan: SNF    Current Diagnoses: Patient Active Problem List   Diagnosis Date Noted   AKI (acute kidney injury) ([place]) 05/13/2022   Neuropathy 05/13/2022   Acute urinary retention 05/13/2022   Status post total knee replacement, left 01/03/2019   Pain in left knee 08/05/2018   Pain in right knee 08/05/2018   Chronic cough 04/15/2018   Foreign body in ear 12/21/2017   Hematuria 10/10/2017   Gait abnormality 02/13/2017   UTI (urinary tract infection) 11/22/2016   Vertigo 11/22/2016   Hypokalemia 11/22/2016   Urinary frequency 09/08/2016   Claudication ([place]) 04/24/2016   Sensorineural hearing loss (SNHL), bilateral 03/09/2016   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   Bilateral carotid artery disease ([place]) 08/07/2014   Peripheral arterial disease ([place]) 08/06/2013   GASTROPARESIS 02/25/2009   COLONIC POLYPS, ADENOMATOUS, HX OF 02/22/2009   HYPERCHOLESTEROLEMIA 09/26/2007   Essential hypertension 09/26/2007   Coronary atherosclerosis 09/26/2007   Allergic rhinitis 09/26/2007   GERD 09/26/2007   Peptic ulcer 12/45/8099   UMBILICAL HERNIA 83/38/2505   NEPHROLITHIASIS 09/26/2007   BLADDER STONE 09/26/2007    Orientation RESPIRATION BLADDER Height & Weight     Self, Time, Place  Normal Continent Weight: 164 lb  (74.4 kg) Height:  '5\' 4"'$  (162.6 cm)  BEHAVIORAL SYMPTOMS/MOOD NEUROLOGICAL BOWEL NUTRITION STATUS      Continent Diet (See DC summary)  AMBULATORY STATUS COMMUNICATION OF NEEDS Skin   Extensive Assist Verbally Normal                       Personal Care Assistance Level of Assistance  Bathing, Feeding, Dressing Bathing Assistance: Maximum assistance Feeding assistance: Limited assistance Dressing Assistance: Maximum assistance     Functional Limitations Info  Sight, Hearing, Speech Sight Info: Adequate Hearing Info: Adequate Speech Info: Adequate    SPECIAL CARE FACTORS FREQUENCY  PT (By licensed PT), OT (By licensed OT)     PT Frequency: 5x week OT Frequency: 5x week            Contractures Contractures Info: Not present    Additional Factors Info  Code Status, Allergies Code Status Info: Full Allergies Info: Lisinopril,  Niaspan (niacin Er)           Current Medications (05/14/2022):  This is the current hospital active medication list Current Facility-Administered Medications  Medication Dose Route Frequency Provider Last Rate Last Admin   acetaminophen (TYLENOL) tablet 650 mg  650 mg Oral Q6H PRN Doutova, Anastassia, MD       Or   acetaminophen (TYLENOL) suppository 650 mg  650 mg Rectal Q6H PRN Doutova, Anastassia, MD       amLODipine (NORVASC) tablet 5 mg  5 mg Oral Daily Doutova, Anastassia, MD   5 mg at 05/14/22 0840   aspirin EC tablet 81 mg  81  mg Oral Daily Toy Baker, MD   81 mg at 05/14/22 0840   atenolol (TENORMIN) tablet 50 mg  50 mg Oral Daily Doutova, Nyoka Lint, MD   50 mg at 05/14/22 0839   atorvastatin (LIPITOR) tablet 40 mg  40 mg Oral q1800 Doutova, Anastassia, MD       clopidogrel (PLAVIX) tablet 75 mg  75 mg Oral QPM Doutova, Anastassia, MD       gabapentin (NEURONTIN) capsule 200 mg  200 mg Oral QHS Doutova, Anastassia, MD   200 mg at 05/14/22 0014   HYDROcodone-acetaminophen (NORCO/VICODIN) 5-325 MG per tablet 1-2 tablet   1-2 tablet Oral Q4H PRN Toy Baker, MD   1 tablet at 05/14/22 0806   losartan (COZAAR) tablet 25 mg  25 mg Oral Daily Alma Friendly, MD       pantoprazole (PROTONIX) EC tablet 40 mg  40 mg Oral BID Toy Baker, MD   40 mg at 05/14/22 0840   tamsulosin (FLOMAX) capsule 0.4 mg  0.4 mg Oral QPC supper Toy Baker, MD       traMADol (ULTRAM) tablet 50 mg  50 mg Oral Q6H PRN Toy Baker, MD       Current Outpatient Medications  Medication Sig Dispense Refill   acetaminophen (TYLENOL) 500 MG tablet Take 1 tablet (500 mg total) by mouth 2 (two) times daily as needed. (Patient taking differently: Take 500 mg by mouth 2 (two) times daily as needed for moderate pain.) 30 tablet 2   amLODipine (NORVASC) 5 MG tablet Take 5 mg by mouth daily.     Apoaequorin (PREVAGEN PO) Take 1 tablet by mouth daily.     aspirin EC 81 MG tablet Take 1 tablet (81 mg total) by mouth 2 (two) times a day. (Patient taking differently: Take 81 mg by mouth daily.) 60 tablet 0   atenolol (TENORMIN) 50 MG tablet Take 50 mg by mouth daily.     atorvastatin (LIPITOR) 40 MG tablet Take 40 mg by mouth daily at 6 PM.      Biotin 10 MG CAPS Take 10 mg by mouth daily.     cholecalciferol (VITAMIN D3) 25 MCG (1000 UT) tablet Take 1,000 Units by mouth daily.     clopidogrel (PLAVIX) 75 MG tablet Take 75 mg by mouth every evening.      diazepam (VALIUM) 5 MG tablet Take 2.5-5 mg by mouth every 8 (eight) hours as needed (vertigo).     hydrochlorothiazide 25 MG tablet Take 25 mg by mouth daily.     HYDROcodone bit-homatropine (HYCODAN) 5-1.5 MG/5ML syrup Take 5 mLs by mouth every 6 (six) hours as needed for cough.     iron polysaccharides (NIFEREX) 150 MG capsule Take 150 mg by mouth every Monday, Wednesday, and Friday.     losartan (COZAAR) 50 MG tablet Take 50 mg by mouth every evening.      Magnesium 250 MG TABS Take 250 mg by mouth at bedtime.     Misc Natural Products (GLUCOSAMINE CHONDROITIN  TRIPLE) TABS Take 1 tablet by mouth 2 (two) times daily.     Multiple Vitamin (MULTIVITAMIN WITH MINERALS) TABS tablet Take 1 tablet by mouth daily.     pantoprazole (PROTONIX) 40 MG tablet TAKE 1 TABLET BY MOUTH TWICE A DAY (Patient taking differently: Take 40 mg by mouth daily.) 180 tablet 0   potassium chloride SA (K-DUR,KLOR-CON) 20 MEQ tablet Take 20 mEq by mouth every evening.      traMADol (ULTRAM) 50 MG tablet  Take 50 mg by mouth every 6 (six) hours as needed for severe pain.     influenza vaccine adjuvanted (FLUAD QUADRIVALENT) 0.5 ML injection Inject into the muscle. 0.5 mL 0     Discharge Medications: Please see discharge summary for a list of discharge medications.  Relevant Imaging Results:  Relevant Lab Results:   Additional Information SS# [id number]  Coralee Pesa, [place]

## 2022-05-14 NOTE — ED Notes (Signed)
Assisted pt to call his wife.

## 2022-05-14 NOTE — ED Notes (Signed)
Bladder scanned patient and pt with 227m in bladder.

## 2022-05-14 NOTE — ED Notes (Signed)
Pt enjoying his lunch, requests bladder scan and foley after lunch.

## 2022-05-15 DIAGNOSIS — R338 Other retention of urine: Secondary | ICD-10-CM | POA: Diagnosis not present

## 2022-05-15 DIAGNOSIS — I10 Essential (primary) hypertension: Secondary | ICD-10-CM | POA: Diagnosis not present

## 2022-05-15 DIAGNOSIS — G629 Polyneuropathy, unspecified: Secondary | ICD-10-CM | POA: Diagnosis not present

## 2022-05-15 DIAGNOSIS — N179 Acute kidney failure, unspecified: Secondary | ICD-10-CM | POA: Diagnosis not present

## 2022-05-15 LAB — CBC WITH DIFFERENTIAL/PLATELET
Abs Immature Granulocytes: 0.02 10*3/uL (ref 0.00–0.07)
Basophils Absolute: 0.1 10*3/uL (ref 0.0–0.1)
Basophils Relative: 1 %
Eosinophils Absolute: 0.3 10*3/uL (ref 0.0–0.5)
Eosinophils Relative: 5 %
HCT: 40.6 % (ref 39.0–52.0)
Hemoglobin: 13.5 g/dL (ref 13.0–17.0)
Immature Granulocytes: 0 %
Lymphocytes Relative: 17 %
Lymphs Abs: 1.3 10*3/uL (ref 0.7–4.0)
MCH: 27.7 pg (ref 26.0–34.0)
MCHC: 33.3 g/dL (ref 30.0–36.0)
MCV: 83.2 fL (ref 80.0–100.0)
Monocytes Absolute: 0.6 10*3/uL (ref 0.1–1.0)
Monocytes Relative: 9 %
Neutro Abs: 5 10*3/uL (ref 1.7–7.7)
Neutrophils Relative %: 68 %
Platelets: 195 10*3/uL (ref 150–400)
RBC: 4.88 MIL/uL (ref 4.22–5.81)
RDW: 13.8 % (ref 11.5–15.5)
WBC: 7.3 10*3/uL (ref 4.0–10.5)
nRBC: 0 % (ref 0.0–0.2)

## 2022-05-15 LAB — BASIC METABOLIC PANEL
Anion gap: 9 (ref 5–15)
BUN: 21 mg/dL (ref 8–23)
CO2: 26 mmol/L (ref 22–32)
Calcium: 9.1 mg/dL (ref 8.9–10.3)
Chloride: 105 mmol/L (ref 98–111)
Creatinine, Ser: 1.05 mg/dL (ref 0.61–1.24)
GFR, Estimated: >60 mL/min (ref >60)
Glucose, Bld: 85 mg/dL (ref 70–99)
Potassium: 3.7 mmol/L (ref 3.5–5.1)
Sodium: 140 mmol/L (ref 135–145)

## 2022-05-15 MED ORDER — AMLODIPINE BESYLATE 10 MG PO TABS
10.0000 mg | ORAL_TABLET | Freq: Every day | ORAL | Status: DC
  Administered 2022-05-15 – 2022-05-16 (×2): 10 mg via ORAL
  Filled 2022-05-15 (×2): qty 1

## 2022-05-15 MED ORDER — AMLODIPINE BESYLATE 5 MG PO TABS: 10.0000 mg | ORAL_TABLET | Freq: Every day | ORAL | Status: DC

## 2022-05-15 MED ORDER — ENSURE ENLIVE PO LIQD
237.0000 mL | ORAL | Status: DC
  Administered 2022-05-15: 237 mL via ORAL

## 2022-05-15 MED ORDER — TAMSULOSIN HCL 0.4 MG PO CAPS: 0.4000 mg | ORAL_CAPSULE | Freq: Every day | ORAL | Status: DC

## 2022-05-15 MED ORDER — LOSARTAN POTASSIUM 50 MG PO TABS
50.0000 mg | ORAL_TABLET | Freq: Every day | ORAL | Status: DC
  Administered 2022-05-15 – 2022-05-16 (×2): 50 mg via ORAL
  Filled 2022-05-15 (×2): qty 1

## 2022-05-15 MED ORDER — HYDRALAZINE HCL 20 MG/ML IJ SOLN
10.0000 mg | Freq: Three times a day (TID) | INTRAMUSCULAR | Status: DC | PRN
  Administered 2022-05-15: 10 mg via INTRAVENOUS
  Filled 2022-05-15: qty 1

## 2022-05-15 NOTE — Discharge Summary (Signed)
Physician Discharge Summary   Patient: Nathan Santiago MRN: [id number] DOB: 1931-05-21  Admit date:     05/13/2022  Discharge date: 05/16/22  Discharge Physician: Alma Friendly   PCP: [name], MD   Recommendations at discharge:   Follow-up with PCP in 1 week Follow-up with urology as scheduled   Discharge Diagnoses: Principal Problem:   AKI (acute kidney injury) ([place]) Active Problems:   HYPERCHOLESTEROLEMIA   Essential hypertension   Coronary atherosclerosis   GERD   Peripheral arterial disease ([place])   Neuropathy   Acute urinary retention    Hospital Course: JUANLUIS GUASTELLA is a 88 y.o. male with medical history significant of PAD, HTN, HLD, CAD, PAD presented with generalized fatigue with lower ext weakness/tingling and urinary retention. Pt states he tried to get up, but did not have the strength to ambulate so nurse supervisor suggested to come to the ED, for further management.  Of note, patient presents from [facility].  In the ED, noted to be retaining urine.  Patient noted to have AKI.  PT/OT consulted.  Patient admitted for further management.    Today, patient denies any new complaints. Urology consulted, agreed with discharging patient with Foley and arranging a follow-up appointment in their office for a voiding trial. Stable to d/c to SNF for further rehab needs   Assessment and Plan:  AKI Acute urinary retention Creatinine resolved Ultrasound renal with mild right hydronephrosis Foley catheter placed on 05/15/2022 S/p IV fluids Started on Flomax Urology consulted, agreed for voiding trial as an outpatient, discharged with Foley  Generalized weakness Lower extremity weakness Recent fall Orthostatic negative MRI lumbar spine showed nonacute compression fracture in the setting of hemangioma, L1 height loss.  Also showed unchanged moderate L4-L5 facet arthrosis Neurosurgery consulted, discussed with Dr.  Venetia Constable on 10/16, outpatient follow-up, no further recommendation, likely from peripheral neuropathy PT/OT-SNF  Hypokalemia Replace as needed   Hypertension BP somewhat controlled Continue increased dose of Norvasc, atenolol, cozaar Discontinue hydrochlorothiazide due to advanced age and history of falls PCP should monitor closely and adjust   HLD Continue statins   GERD Continue PPI   CAD/PAD Continue aspirin, statin, Plavix     Consultants: Urology, neurosurgery Procedures performed: None Disposition: Skilled nursing facility Diet recommendation:  Cardiac diet   DISCHARGE MEDICATION: Allergies as of 05/16/2022       Reactions   Lisinopril Cough   Niaspan [niacin Er] Rash        Medication List     STOP taking these medications    Fluad Quadrivalent 0.5 ML injection Generic drug: influenza vaccine adjuvanted   hydrochlorothiazide 25 MG tablet Commonly known as: HYDRODIURIL       TAKE these medications    acetaminophen 500 MG tablet Commonly known as: TYLENOL Take 1 tablet (500 mg total) by mouth 2 (two) times daily as needed. What changed: reasons to take this   amLODipine 5 MG tablet Commonly known as: NORVASC Take 2 tablets (10 mg total) by mouth daily. What changed: how much to take   aspirin EC 81 MG tablet Take 1 tablet (81 mg total) by mouth 2 (two) times a day. What changed: when to take this   atenolol 50 MG tablet Commonly known as: TENORMIN Take 50 mg by mouth daily.   atorvastatin 40 MG tablet Commonly known as: LIPITOR Take 40 mg by mouth daily at 6 PM.   Biotin 10 MG Caps Take 10 mg by mouth daily.   cholecalciferol  25 MCG (1000 UNIT) tablet Commonly known as: VITAMIN D3 Take 1,000 Units by mouth daily.   clopidogrel 75 MG tablet Commonly known as: PLAVIX Take 75 mg by mouth every evening.   diazepam 5 MG tablet Commonly known as: VALIUM Take 2.5-5 mg by mouth every 8 (eight) hours as needed (vertigo).    Glucosamine Chondroitin Triple Tabs Take 1 tablet by mouth 2 (two) times daily.   HYDROcodone bit-homatropine 5-1.5 MG/5ML syrup Commonly known as: HYCODAN Take 5 mLs by mouth every 6 (six) hours as needed for cough.   iron polysaccharides 150 MG capsule Commonly known as: NIFEREX Take 150 mg by mouth every Monday, Wednesday, and Friday.   losartan 50 MG tablet Commonly known as: COZAAR Take 50 mg by mouth every evening.   Magnesium 250 MG Tabs Take 250 mg by mouth at bedtime.   multivitamin with minerals Tabs tablet Take 1 tablet by mouth daily.   pantoprazole 40 MG tablet Commonly known as: PROTONIX TAKE 1 TABLET BY MOUTH TWICE A DAY What changed: when to take this   potassium chloride SA 20 MEQ tablet Commonly known as: KLOR-CON M Take 20 mEq by mouth every evening.   PREVAGEN PO Take 1 tablet by mouth daily.   tamsulosin 0.4 MG Caps capsule Commonly known as: FLOMAX Take 1 capsule (0.4 mg total) by mouth daily after supper.   traMADol 50 MG tablet Commonly known as: ULTRAM Take 50 mg by mouth every 6 (six) hours as needed for severe pain.         Follow-up Information     [name], MD. Schedule an appointment as soon as possible for a visit in 1 week(s).   Specialty: Endocrinology Contact information: [place] [address] [phone]         Ceasar Mons, MD. Call.   Specialty: Urology Why: Office will call you about the foley catheter. You will have an appointment to see when it can be removed Contact information: [address] [place] [address] [phone]                Discharge Exam: Danley Danker Weights   05/14/22 0738  Weight: 74.4 kg   General: NAD, deconditioned Cardiovascular: S1, S2 present Respiratory: CTAB Abdomen: Soft, nontender, nondistended, bowel sounds present Musculoskeletal: No bilateral pedal edema noted Skin: Normal Psychiatry: Normal mood   Condition at  discharge: fair    The results of significant diagnostics from this hospitalization (including imaging, microbiology, ancillary and laboratory) are listed below for reference.   Imaging Studies: MR LUMBAR SPINE WO CONTRAST  Result Date: 05/14/2022 CLINICAL DATA:  Lumbar radiculopathy EXAM: MRI LUMBAR SPINE WITHOUT CONTRAST TECHNIQUE: Multiplanar, multisequence MR imaging of the lumbar spine was performed. No intravenous contrast was administered. COMPARISON:  None Available. FINDINGS: Segmentation:  Standard. Alignment:  Physiologic. Vertebrae: Low STIR signal, hyperintense T1/T2-weighted signal within the L1 vertebral body with approximately 25% height loss. Otherwise normal vertebrae. Conus medullaris and cauda equina: Conus extends to the L1 level. Conus and cauda equina appear normal. Paraspinal and other soft tissues: Progression of left renal atrophy Disc levels: T12-L1: Small central disc protrusion without stenosis L1-L2: Small central disc protrusion. No spinal canal stenosis. No neural foraminal stenosis. L2-L3: Small disc bulge. No spinal canal stenosis. No neural foraminal stenosis. L3-L4: Minimal disc bulge. No spinal canal stenosis. No neural foraminal stenosis. L4-L5: Moderate facet hypertrophy and small disc bulge. No spinal canal stenosis. No neural foraminal stenosis. L5-S1: Disc space  narrowing with left asymmetric bulge. No spinal canal stenosis. Left neural foraminal stenosis. Visualized sacrum: Normal. IMPRESSION: 1. L1 height loss without bone marrow edema. This may be a non acute compression fracture in the setting of hemangioma. 2. Mild left L5-S1 neural foraminal stenosis. 3. Unchanged moderate L4-5 facet arthrosis, which may serve as a source of local low back pain. Electronically Signed   By: Ulyses Jarred M.D.   On: 05/14/2022 02:21   US RENAL  Result Date: 05/14/2022 CLINICAL DATA:  Acute kidney injury EXAM: RENAL / URINARY TRACT ULTRASOUND COMPLETE COMPARISON:   02/15/2018 FINDINGS: Right Kidney: Renal measurements: 10.9 x 5.2 x 7.2 cm = volume: 212 mL. Normal echotexture. Mild right hydronephrosis. No mass. Left Kidney: Renal measurements: 9.7 x 4.8 x 5.1 cm = volume: 123 mL. 1.4 cm cyst in the midpole. Normal echotexture. No hydronephrosis. Bladder: Appears normal for degree of bladder distention. Other: None. IMPRESSION: Mild right hydronephrosis. Electronically Signed   By: Rolm Baptise M.D.   On: 05/14/2022 00:40   DG Chest Port 1 View  Result Date: 05/13/2022 CLINICAL DATA:  Weight loss, weakness.  Fall 1 week ago. EXAM: ABDOMEN - 1 VIEW; PORTABLE CHEST - 1 VIEW COMPARISON:  01/25/2018, 05/02/2022. FINDINGS: The heart and mediastinal contour are stable. There is atherosclerotic calcification of the aorta. No consolidation, effusion, or pneumothorax. No acute osseous abnormality. The bowel gas pattern is normal. A vascular stent is present over the lumbar spine. No radio-opaque calculi. Degenerative changes are present in the thoracolumbar spine. IMPRESSION: 1. No cardiopulmonary process. 2. Nonobstructive bowel-gas pattern. Electronically Signed   By: Brett Fairy M.D.   On: 05/13/2022 22:34   DG Abd 1 View  Result Date: 05/13/2022 CLINICAL DATA:  Weight loss, weakness.  Fall 1 week ago. EXAM: ABDOMEN - 1 VIEW; PORTABLE CHEST - 1 VIEW COMPARISON:  01/25/2018, 05/02/2022. FINDINGS: The heart and mediastinal contour are stable. There is atherosclerotic calcification of the aorta. No consolidation, effusion, or pneumothorax. No acute osseous abnormality. The bowel gas pattern is normal. A vascular stent is present over the lumbar spine. No radio-opaque calculi. Degenerative changes are present in the thoracolumbar spine. IMPRESSION: 1. No cardiopulmonary process. 2. Nonobstructive bowel-gas pattern. Electronically Signed   By: Brett Fairy M.D.   On: 05/13/2022 22:34   CT HEAD WO CONTRAST (5MM)  Result Date: 05/02/2022 CLINICAL DATA:  Status post trauma.  EXAM: CT HEAD WITHOUT CONTRAST TECHNIQUE: Contiguous axial images were obtained from the base of the skull through the vertex without intravenous contrast. RADIATION DOSE REDUCTION: This exam was performed according to the departmental dose-optimization program which includes automated exposure control, adjustment of the mA and/or kV according to patient size and/or use of iterative reconstruction technique. COMPARISON:  November 22, 2016 FINDINGS: Brain: There is mild to moderate severity cerebral atrophy with widening of the extra-axial spaces and ventricular dilatation. There are areas of decreased attenuation within the white matter tracts of the supratentorial brain, consistent with microvascular disease changes. Vascular: No hyperdense vessel or unexpected calcification. Skull: Normal. Negative for fracture or focal lesion. Sinuses/Orbits: No acute finding. Other: None. IMPRESSION: 1. No acute intracranial abnormality. 2. Cerebral atrophy and microvascular disease changes of the supratentorial brain. Electronically Signed   By: Virgina Norfolk M.D.   On: 05/02/2022 21:42   DG Chest Portable 1 View  Result Date: 05/02/2022 CLINICAL DATA:  Fall. EXAM: PORTABLE CHEST 1 VIEW COMPARISON:  January 06, 2021. FINDINGS: The heart size and mediastinal contours are within normal limits. Both  lungs are clear. The visualized skeletal structures are unremarkable. IMPRESSION: No active disease. Electronically Signed   By: Marijo Conception M.D.   On: 05/02/2022 21:24    Microbiology: Results for orders placed or performed during the hospital encounter of 05/13/22  SARS Coronavirus 2 by RT PCR (hospital order, performed in [facility] hospital lab) *cepheid single result test* Anterior Nasal Swab     Status: None   Collection Time: 05/14/22  1:08 AM   Specimen: Anterior Nasal Swab  Result Value Ref Range Status   SARS Coronavirus 2 by RT PCR NEGATIVE NEGATIVE Final    Comment: (NOTE) SARS-CoV-2 target nucleic acids are  NOT DETECTED.  The SARS-CoV-2 RNA is generally detectable in upper and lower respiratory specimens during the acute phase of infection. The lowest concentration of SARS-CoV-2 viral copies this assay can detect is 250 copies / mL. A negative result does not preclude SARS-CoV-2 infection and should not be used as the sole basis for treatment or other patient management decisions.  A negative result may occur with improper specimen collection / handling, submission of specimen other than nasopharyngeal swab, presence of viral mutation(s) within the areas targeted by this assay, and inadequate number of viral copies (<250 copies / mL). A negative result must be combined with clinical observations, patient history, and epidemiological information.  Fact Sheet for Patients:   https://www.patel.info/  Fact Sheet for Healthcare Providers: https://hall.com/  This test is not yet approved or  cleared by the [place] FDA and has been authorized for detection and/or diagnosis of SARS-CoV-2 by FDA under an Emergency Use Authorization (EUA).  This EUA will remain in effect (meaning this test can be used) for the duration of the COVID-19 declaration under Section 564(b)(1) of the Act, 21 U.S.C. section 360bbb-3(b)(1), unless the authorization is terminated or revoked sooner.  Performed at [facility], [place] [address], [place], [place] [address]     Labs: CBC: Recent Labs  Lab 05/13/22 1510 05/13/22 2341 05/14/22 0452 05/15/22 0105 05/15/22 0613 05/16/22 0110  WBC 9.5  --  7.5 TEST REQUEST RECEIVED WITHOUT APPROPRIATE SPECIMEN 7.3 8.7  NEUTROABS 7.3  --   --   --  5.0 6.4  HGB 12.6* 11.6* 12.2*  --  13.5 12.6*  HCT 37.7* 34.0* 39.4  --  40.6 38.4*  MCV 85.1  --  89.5  --  83.2 83.3  PLT 245  --  190  --  195 017   Basic Metabolic Panel: Recent Labs  Lab 05/13/22 1510 05/13/22 1848 05/13/22 2334 05/13/22 2341 05/14/22 0452  05/15/22 0613  NA 143  --   --  142 140 140  K 4.0  --   --  3.4* 3.2* 3.7  CL 108  --   --   --  106 105  CO2 28  --   --   --  26 26  GLUCOSE 114*  --   --   --  90 85  BUN 33*  --   --   --  24* 21  CREATININE 1.43*  --   --   --  1.14 1.05  CALCIUM 9.3  --   --   --  8.6* 9.1  MG  --  2.4  --   --  2.2  --   PHOS  --   --  3.2  --  2.7  --    Liver Function Tests: Recent Labs  Lab 05/13/22 1510 05/13/22 2334 05/14/22  0452  AST '22 17 19  '$ ALT '21 22 21  '$ ALKPHOS 70 66 64  BILITOT 0.7 0.9 0.8  PROT 6.7 6.3* 6.1*  ALBUMIN 3.5 3.3* 3.2*   CBG: No results for input(s): "GLUCAP" in the last 168 hours.  Discharge time spent: less than 30 minutes.  Signed: Alma Friendly, MD [facility] 05/16/2022

## 2022-05-15 NOTE — Evaluation (Signed)
Occupational Therapy Evaluation Patient Details Name: Nathan Santiago MRN: [id number] DOB: 1930/12/14 Today's Date: 05/15/2022   History of Present Illness 88 y.o. male Presented with generalized fatigue and lower extremity weakness. Had recent fall. PMH significant of HTN, HLD, CAD, PAD   Clinical Impression   Pt independent at baseline with ADLs and uses rollator for mobility. Pt needing set up -max A for ADLs, min A for bed mobility, and min-mod A +2 for transfer to chair. Pt fatigued after transfer, reporting mild dizziness, however BP WNL (see below). Pt presenting with impairments listed below, will follow acutely. Recommend SNF at d/c.  BP supine 150/59 (86) BP seated EOB 171/63 (93) BP seated post-transfer 169/54 (85)      Recommendations for follow up therapy are one component of a multi-disciplinary discharge planning process, led by the attending physician.  Recommendations may be updated based on patient status, additional functional criteria and insurance authorization.   Follow Up Recommendations  Skilled nursing-short term rehab (<3 hours/day)    Assistance Recommended at Discharge Frequent or constant Supervision/Assistance  Patient can return home with the following A lot of help with walking and/or transfers;A lot of help with bathing/dressing/bathroom;Assistance with cooking/housework;Direct supervision/assist for medications management;Direct supervision/assist for financial management;Assist for transportation;Help with stairs or ramp for entrance    Functional Status Assessment  Patient has had a recent decline in their functional status and demonstrates the ability to make significant improvements in function in a reasonable and predictable amount of time.  Equipment Recommendations  None recommended by OT (defer)    Recommendations for Other Services PT consult     Precautions / Restrictions Precautions Precautions: Fall Precaution Comments: recent fall  at home Restrictions Weight Bearing Restrictions: No      Mobility Bed Mobility Overal bed mobility: Needs Assistance Bed Mobility: Supine to Sit     Supine to sit: Min assist          Transfers Overall transfer level: Needs assistance Equipment used: Rolling walker (2 wheels) Transfers: Sit to/from Stand, Bed to chair/wheelchair/BSC Sit to Stand: Min assist     Step pivot transfers: Mod assist, +2 physical assistance     General transfer comment: posterior lean      Balance Overall balance assessment: Needs assistance Sitting-balance support: Bilateral upper extremity supported, Feet unsupported Sitting balance-Leahy Scale: Fair Sitting balance - Comments: sits unsupported without LOB   Standing balance support: Bilateral upper extremity supported, Reliant on assistive device for balance Standing balance-Leahy Scale: Poor Standing balance comment: posterior lean                           ADL either performed or assessed with clinical judgement   ADL Overall ADL's : Needs assistance/impaired Eating/Feeding: Set up;Sitting   Grooming: Set up;Sitting;Standing   Upper Body Bathing: Sitting;Moderate assistance   Lower Body Bathing: Sitting/lateral leans;Sit to/from stand;Moderate assistance   Upper Body Dressing : Sitting;Standing;Moderate assistance   Lower Body Dressing: Moderate assistance;Sitting/lateral leans;Bed level   Toilet Transfer: Moderate assistance;+2 for physical assistance   Toileting- Clothing Manipulation and Hygiene: Maximal assistance       Functional mobility during ADLs: Moderate assistance;+2 for physical assistance       Vision Baseline Vision/History: 1 Wears glasses Vision Assessment?: No apparent visual deficits     Perception     Praxis      Pertinent Vitals/Pain Pain Assessment Pain Assessment: No/denies pain     Hand Dominance Left  Extremity/Trunk Assessment Upper Extremity Assessment Upper  Extremity Assessment: Generalized weakness   Lower Extremity Assessment Lower Extremity Assessment: Defer to PT evaluation   Cervical / Trunk Assessment Cervical / Trunk Assessment: Normal   Communication Communication Communication: No difficulties   Cognition Arousal/Alertness: Awake/alert Behavior During Therapy: WFL for tasks assessed/performed Overall Cognitive Status: Impaired/Different from baseline Area of Impairment: Orientation, Attention, Problem solving                 Orientation Level: Place, Time Current Attention Level: Sustained         Problem Solving: Slow processing, Requires verbal cues       General Comments  VSS on RA, spouse present    Exercises     Shoulder Instructions      Home Living Family/patient expects to be discharged to:: Skilled nursing facility                                        Prior Functioning/Environment Prior Level of Function : Independent/Modified Independent             Mobility Comments: normally uses rollator inside home and electric scooter if going out (ex. to [employer]) ADLs Comments: ind with ADLs        OT Problem List: Decreased range of motion;Decreased strength;Decreased activity tolerance;Impaired balance (sitting and/or standing);Decreased cognition;Decreased safety awareness      OT Treatment/Interventions: Self-care/ADL training;Therapeutic exercise;Energy conservation;DME and/or AE instruction;Therapeutic activities;Visual/perceptual remediation/compensation;Patient/family education;Balance training    OT Goals(Current goals can be found in the care plan section) Acute Rehab OT Goals Patient Stated Goal: none stated OT Goal Formulation: With patient Time For Goal Achievement: 05/29/22 Potential to Achieve Goals: Good ADL Goals Pt Will Perform Upper Body Dressing: with min guard assist;sitting Pt Will Perform Lower Body Dressing: with min assist;sitting/lateral  leans;sit to/from stand Pt Will Transfer to Toilet: with min assist;bedside commode;squat pivot transfer;stand pivot transfer Pt Will Perform Tub/Shower Transfer: Shower transfer;Tub transfer;with min assist;ambulating;shower seat;rolling walker Additional ADL Goal #1: pt will perform bed mobilty with supervision in prep for ADLs  OT Frequency: Min 2X/week    Co-evaluation              AM-PAC OT "6 Clicks" Daily Activity     Outcome Measure Help from another person eating meals?: None Help from another person taking care of personal grooming?: A Little Help from another person toileting, which includes using toliet, bedpan, or urinal?: A Lot Help from another person bathing (including washing, rinsing, drying)?: A Lot Help from another person to put on and taking off regular upper body clothing?: A Lot Help from another person to put on and taking off regular lower body clothing?: A Lot 6 Click Score: 15   End of Session Equipment Utilized During Treatment: Gait belt;Rolling walker (2 wheels) Nurse Communication: Mobility status  Activity Tolerance: Patient limited by fatigue Patient left: in chair;with call bell/phone within reach;with chair alarm set;with family/visitor present  OT Visit Diagnosis: Unsteadiness on feet (R26.81);Other abnormalities of gait and mobility (R26.89);Muscle weakness (generalized) (M62.81)                Time: 1025-8527 OT Time Calculation (min): 31 min Charges:  OT General Charges $OT Visit: 1 Visit OT Evaluation $OT Eval Moderate Complexity: 1 Mod OT Treatments $Therapeutic Activity: 8-22 mins  Ezme Duch, OTD, OTR/L Acute Rehab [phone] - [place]  Jose Corvin 05/15/2022, 12:47 PM

## 2022-05-15 NOTE — Progress Notes (Signed)
Bladder scan showed 482 ml. Foley cath placed per MD order. Pt tolerated well.   Lavenia Atlas, RN

## 2022-05-15 NOTE — Progress Notes (Addendum)
Initial Nutrition Assessment  DOCUMENTATION CODES:   Not applicable  INTERVENTION:  Liberalize diet to regular to encourage oral intake. Brought patient and wife menu from unit floor.  Provide Ensure Enlive/Ensure Plus High Protein po once daily, each supplement provides 350 kcal and 20 grams of protein.  NUTRITION DIAGNOSIS:   Inadequate oral intake related to decreased appetite, constipation as evidenced by per patient/family report.  GOAL:   Patient will meet greater than or equal to 90% of their needs  MONITOR:   PO intake, Supplement acceptance, Labs, Weight trends, I & O's  REASON FOR ASSESSMENT:   Consult Assessment of nutrition requirement/status  ASSESSMENT:   88 year old male with PMHx of HTN, HLD, GERD, CAD, gastroparesis, PAD admitted from [facility] independent living facility with generalized fatigue, lower extremity weakness/tingling, and urinary retention found to have AKI.  Met with pt and wife at bedside. Pt reports decreased appetite and intake during hospital stay. Wife reports may be related to constipation and also confusion with ordering (they didn't receive a menu and received a late tray since they hadn't placed an order earlier in the day). At baseline pt reports good appetite and intake. They are served 3 meals per day at [employer] He reports typically eating macaroni and cheese, beef, and salads. He denies any nausea, emesis, abdominal pain, or diarrhea. Reports it has been 1-2 days since he had a BM and he typically has regular bowel movements. Denies food allergies or intolerances. Discussed in setting of acute decreased appetite and intake, recommendation for liberalization to regular diet to encourage increased intake. Pt also agreeable to trying Ensure Plus High Protein once daily as oral nutrition supplement. Provided patient and wife with  menu.   Pt denies any wt loss and reports he is at his UBW. Reports UBW 164 lbs (74.5 kg). Noted wts in  chart fluctuate between 73.9-78.5 kg. Pt documented to be 78.5 kg on 12/06/21, though this is above reported UBW. If wts in chart are accurate he has lost 4.1 kg or 5.2% wt over the past 5 months, which is not significant for time frame. Also, suspect may not be accurate due to reported UBW lower than this.  UOP: 625 mL (0.4 mL/kg/hr)  I/O: -125 mL since admission  Medications reviewed and include: atenolol, pantoprazole, Flomax.  Labs reviewed.  Pt does not meet criteria for malnutrition at this time.  Discussed with RN.  NUTRITION - FOCUSED PHYSICAL EXAM:  Flowsheet Row Most Recent Value  Orbital Region No depletion  Upper Arm Region Mild depletion  Thoracic and Lumbar Region No depletion  Buccal Region No depletion  Temple Region No depletion  Clavicle Bone Region No depletion  Clavicle and Acromion Bone Region No depletion  Scapular Bone Region No depletion  Dorsal Hand No depletion  Patellar Region No depletion  Anterior Thigh Region No depletion  Posterior Calf Region No depletion  Edema (RD Assessment) None  Hair Reviewed  Eyes Reviewed  Mouth Reviewed  Skin Reviewed  Nails Reviewed       Diet Order:   Diet Order             Diet Heart Room service appropriate? No; Fluid consistency: Thin  Diet effective now                   EDUCATION NEEDS:   No education needs have been identified at this time  Skin:  Skin Assessment: Reviewed RN Assessment  Last BM:  PTA  Height:  Ht Readings from Last 1 Encounters:  05/14/22 '5\' 4"'  (1.626 m)    Weight:   Wt Readings from Last 1 Encounters:  05/14/22 74.4 kg    BMI:  Body mass index is 28.15 kg/m.  Estimated Nutritional Needs:   Kcal:  1900-2100  Protein:  95-105 grams  Fluid:  1.9-2.1 L/day  Loanne Drilling, MS, RD, LDN, CNSC Pager number available on Amion

## 2022-05-15 NOTE — TOC Progression Note (Signed)
Transition of Care [facility]) - Progression Note    Patient Details  Name: Nathan Santiago MRN: [id number] Date of Birth: Dec 23, 1930  Transition of Care [facility]) CM/SW [place], [place] Phone Number: 05/15/2022, 2:40 PM  Clinical Narrative:     CSW spoke with [facility]/Donna- she informed, it too late in the afternoon for the patient to d/c to their facility w/ PTAR- the patient may arrived too late-they request d/c tomorrow morning.   Expected Discharge Plan: [place] Barriers to Discharge: Continued Medical Work up  Expected Discharge Plan and Services Expected Discharge Plan: [place] Choice: [facility] arrangements for the past 2 months: [place] Expected Discharge Date: 05/15/22                                     Social Determinants of Health (SDOH) Interventions    Readmission Risk Interventions     No data to display

## 2022-05-16 LAB — CBC WITH DIFFERENTIAL/PLATELET
Abs Immature Granulocytes: 0.02 10*3/uL (ref 0.00–0.07)
Basophils Absolute: 0.1 10*3/uL (ref 0.0–0.1)
Basophils Relative: 1 %
Eosinophils Absolute: 0.4 10*3/uL (ref 0.0–0.5)
Eosinophils Relative: 4 %
HCT: 38.4 % — ABNORMAL LOW (ref 39.0–52.0)
Hemoglobin: 12.6 g/dL — ABNORMAL LOW (ref 13.0–17.0)
Immature Granulocytes: 0 %
Lymphocytes Relative: 14 %
Lymphs Abs: 1.2 10*3/uL (ref 0.7–4.0)
MCH: 27.3 pg (ref 26.0–34.0)
MCHC: 32.8 g/dL (ref 30.0–36.0)
MCV: 83.3 fL (ref 80.0–100.0)
Monocytes Absolute: 0.7 10*3/uL (ref 0.1–1.0)
Monocytes Relative: 8 %
Neutro Abs: 6.4 10*3/uL (ref 1.7–7.7)
Neutrophils Relative %: 73 %
Platelets: 189 10*3/uL (ref 150–400)
RBC: 4.61 MIL/uL (ref 4.22–5.81)
RDW: 13.9 % (ref 11.5–15.5)
WBC: 8.7 10*3/uL (ref 4.0–10.5)
nRBC: 0 % (ref 0.0–0.2)

## 2022-05-16 MED ORDER — DIAZEPAM 5 MG PO TABS: 2.5000 mg | ORAL_TABLET | Freq: Three times a day (TID) | ORAL | 0 refills | Status: DC | PRN

## 2022-05-16 MED ORDER — TRAMADOL HCL 50 MG PO TABS: 50.0000 mg | ORAL_TABLET | Freq: Four times a day (QID) | ORAL | 0 refills | Status: AC | PRN

## 2022-05-16 MED ORDER — HYDROCODONE BIT-HOMATROP MBR 5-1.5 MG/5ML PO SOLN: 5.0000 mL | Freq: Four times a day (QID) | ORAL | 0 refills | Status: DC | PRN

## 2022-05-16 NOTE — TOC Transition Note (Signed)
Transition of Care [facility]) - CM/SW Discharge Note   Patient Details  Name: Nathan Santiago MRN: [id number] Date of Birth: 1931/02/06  Transition [facility] CM/SW Contact:  Vinie Sill, LCSW Phone Number: 05/16/2022, 11:35 AM   Clinical Narrative:     Patient will Discharge to: [facility]/SNF  Discharge Date: 05/16/2022 Family Notified: [name], spouse  Transport By: Corey Harold   Per MD patient is ready for discharge. RN, patient, and facility notified of discharge. Discharge Summary sent to facility. RN given number for [phone]. Ambulance transport requested for patient @ 11:37am.  Clinical Social Worker signing off.  Thurmond Butts, MSW, LCSW Clinical Social Worker     Final next level of care: Skilled Nursing Facility Barriers to Discharge: Barriers Resolved   Patient Goals and CMS Choice     Choice offered to / list presented to : Patient, Spouse  Discharge Placement              Patient chooses bed at: [facility] Patient to be transferred to facility by: [place] Name of family member notified: Spouse Norma Patient and family notified of of transfer: 05/16/22  Discharge Plan and Services     Post Acute Care Choice: [place]                               Social Determinants of Health (SDOH) Interventions     Readmission Risk Interventions     No data to display

## 2022-05-17 ENCOUNTER — Encounter: Payer: Self-pay | Admitting: Orthopedic Surgery

## 2022-05-17 ENCOUNTER — Non-Acute Institutional Stay (SKILLED_NURSING_FACILITY): Payer: Medicare Other | Admitting: Orthopedic Surgery

## 2022-05-17 DIAGNOSIS — I10 Essential (primary) hypertension: Secondary | ICD-10-CM

## 2022-05-17 DIAGNOSIS — R269 Unspecified abnormalities of gait and mobility: Secondary | ICD-10-CM | POA: Diagnosis not present

## 2022-05-17 DIAGNOSIS — I739 Peripheral vascular disease, unspecified: Secondary | ICD-10-CM

## 2022-05-17 DIAGNOSIS — E876 Hypokalemia: Secondary | ICD-10-CM

## 2022-05-17 DIAGNOSIS — R338 Other retention of urine: Secondary | ICD-10-CM | POA: Diagnosis not present

## 2022-05-17 DIAGNOSIS — G629 Polyneuropathy, unspecified: Secondary | ICD-10-CM

## 2022-05-17 DIAGNOSIS — N179 Acute kidney failure, unspecified: Secondary | ICD-10-CM

## 2022-05-17 DIAGNOSIS — E7800 Pure hypercholesterolemia, unspecified: Secondary | ICD-10-CM

## 2022-05-17 NOTE — Progress Notes (Unsigned)
Location:  [place] Room Number: 160/A Place of Service:  SNF ([phone]) Provider:  Yvonna Alanis, NP   [name], MD  Patient Care Team: [name], MD as PCP - General (Endocrinology) Lorretta Harp, MD as PCP - Cardiology (Cardiology)  Extended Emergency Contact Information Primary Emergency Contact: Marjie Skiff Address: [address]          [place], [place] [address] Johnnette Litter of [place] Phone: [phone] Mobile Phone: [phone] Relation: Spouse  Code Status:  Full code Goals of care: Advanced Directive information    05/14/2022    7:22 AM  Advanced Directives  Does Patient Have a Medical Advance Directive? Yes  Type of Paramedic of [place];Living will     Chief Complaint  Patient presents with   Hospitalization Follow-up    HPI:  Pt is a 88 y.o. male seen today for f/u s/p hospitalization at [facility] 10/14- 10/17.   He currently resides on the rehab unit at [facility] PMH: CAD, HTN, HLD, PAD, GERD, peptic ulcer, neuropathy, bladder stones, hearing loss, bilateral knee pain, and gait abnormality.   10/03 he had a mechanical fall with head injury. CT head negative for acute intracranial abnormality.  Presented to the ED 10/14 due to generalized fatigue, lower extremity weakness and urinary retention. In the ED, he had 500 cc urine on bladder scan, foley placed. CXR unremarkable. KUB indicated non obstructive gas pattern. EKG NSR. WBC 9.5,BUN/creat 33/1.43, GFR 47, total CK 50, hgb 12.6, hct 37.7.  Renal ultrasound noted mild right hydronephrosis. Admitted for AKI. IV fluids given. Urology consulted. Started on flomax. Discharged with foley and advised to f/u for voiding trial. Urology f/u 05/31/2022. PT/OT started for weakness thought to be related to peripheral neuropathy.Orthostatics negative.  MRI lumbar spine noted non acute compression fracture in the setting of hemangioma, L1  height loss, moderate L4-L5 facet arthrosis. Neurosurgery consulted, recommended outpatient f/u 10/16. BP elevated. HCTZ discontinued due to age and falls. Remains on Norvasc,atenolol and cozaar. He was discharged to [facility] rehab for additional PT/OT.   Today, he continues to have weakness. He is able to stand 1+ assist with walker. Also able to take a few steps. Foley with good output, urine yellow/clear. Denies chest pain, sob or generalized pain. Appetite fair. Goal to return to [place] at [place]. Vitals stable.     Past Medical History:  Diagnosis Date   AAA (abdominal aortic aneurysm) ([place])    asymptomatic   AAA (abdominal aortic aneurysm) ([place]) 2010   peripheral  angiogram-- bilateral SFA DISEASE  and 60 to 70% infrarenaal abd. aortic stenosis with 15 -mm gradient   Adenomatous colon polyp 11/2003   CAD (coronary artery disease)    Gait abnormality 02/13/2017   Gastroparesis    pt denies   GERD (gastroesophageal reflux disease)    w/ LPR   History of kidney stones    x2   Hyperlipidemia    Hypertension    IDA (iron deficiency anemia)    Peripheral arterial disease ([place])    RCEA  by Dr Lemar Livings   PUD (peptic ulcer disease)    pt unaware   Vertigo    Past Surgical History:  Procedure Laterality Date   ABDOMINAL AORTOGRAM W/LOWER EXTREMITY Bilateral 08/05/2018   Procedure: ABDOMINAL AORTOGRAM W/LOWER EXTREMITY;  Surgeon: Lorretta Harp, MD;  Location: [facility];  Service: Cardiovascular;  Laterality: Bilateral;   ABDOMINAL AORTOGRAM W/LOWER EXTREMITY N/A 09/26/2021  Procedure: ABDOMINAL AORTOGRAM W/LOWER EXTREMITY;  Surgeon: Lorretta Harp, MD;  Location: [facility];  Service: Cardiovascular;  Laterality: N/A;   AORTOGRAM  04/24/2016    Abdominal aortogram, bilateral iliac angiogram, bifemoral runoff   CARDIAC CATHETERIZATION  05/12/2005   RCA   carotid doppler  01/24/2013   RICA endarterectomy,left CCA 0-49%; left bulb and prox ICA 50-69%;  bilaateral subclavian < 50%   CAROTID ENDARTERECTOMY  11/01/2010   CATARACT EXTRACTION     COLONOSCOPY     CORONARY ANGIOPLASTY  05/18/2005   2 STENTS distal RCA AND PROXIMAL-MID RCA   5 total stents per pt.   CYSTOSCOPY/URETEROSCOPY/HOLMIUM LASER/STENT PLACEMENT Left 01/11/2018   Procedure: LEFT URETEROSCOPY/HOLMIUM LASER/STENT PLACEMENT;  Surgeon: Ardis Hughs, MD;  Location: [facility];  Service: Urology;  Laterality: Left;   DOPPLER ECHOCARDIOGRAPHY  02/07/2012   EF 55%,SHOWED NO ISCHEMIA    HERNIA REPAIR     umbilical   lower arterial  doppler  02/04/2013   aotra 1.5 x 1.5 cm; distal abd aorta 70-99%,proximal common iliac arteries -very stenotic with increased velocities>50%,may be falsely elevated as a result of residual plaque from the distal aorta stenosis   lower extremity doppler  June 18 ,2013   ABI'S ABNORMAL, RABI was 0.88 and LABI 0.75 ,with 3-vessel  run off   NM MYOVIEW LTD  MAY 23,2011   showed no significant ischemia;   NM MYOVIEW LTD  04/22/2008   ef 77%,exercise capcity 6 METS ,exaggerated blood pressure response to exercise   PERIPHERAL VASCULAR CATHETERIZATION N/A 04/24/2016   Procedure: Lower Extremity Angiography;  Surgeon: Lorretta Harp, MD;  Location: [facility] [facility];  Service: Cardiovascular;  Laterality: N/A;   PERIPHERAL VASCULAR CATHETERIZATION  04/24/2016   Procedure: Peripheral Vascular Intervention;  Surgeon: Lorretta Harp, MD;  Location: [facility];  Service: Cardiovascular;;  Aorta   retrograde central aortic catheterization  05/19/2005   cutting balloon atherectomy, c-circ stenosis with DES STENTING CYPHER   TONSILLECTOMY     TOTAL KNEE ARTHROPLASTY Left 01/03/2019   Procedure: TOTAL KNEE ARTHROPLASTY;  Surgeon: Netta Cedars, MD;  Location: [facility];  Service: Orthopedics;  Laterality: Left;  with IS block   TRANSURETHRAL RESECTION OF PROSTATE N/A 09/08/2016   Procedure: TRANSURETHRAL RESECTION OF THE PROSTATE (TURP);  Surgeon: Ardis Hughs, MD;  Location: [facility];  Service: Urology;  Laterality: N/A;   TRANSURETHRAL RESECTION OF PROSTATE     10-10-17  Dr. Louis Meckel   TRANSURETHRAL RESECTION OF PROSTATE N/A 10/10/2017   Procedure: TRANSURETHRAL RESECTION OF THE PROSTATE (TURP);  Surgeon: Ardis Hughs, MD;  Location: [facility];  Service: Urology;  Laterality: N/A;   VASECTOMY      Allergies  Allergen Reactions   Lisinopril Cough   Niaspan [Niacin Er] Rash    Outpatient Encounter Medications as of 05/17/2022  Medication Sig   acetaminophen (TYLENOL) 500 MG tablet Take 1 tablet (500 mg total) by mouth 2 (two) times daily as needed. (Patient taking differently: Take 500 mg by mouth 2 (two) times daily as needed for moderate pain.)   amLODipine (NORVASC) 5 MG tablet Take 2 tablets (10 mg total) by mouth daily.   Apoaequorin (PREVAGEN PO) Take 1 tablet by mouth daily.   aspirin EC 81 MG tablet Take 1 tablet (81 mg total) by mouth 2 (two) times a day. (Patient taking differently: Take 81 mg by mouth daily.)   atenolol (TENORMIN) 50 MG tablet Take 50 mg by mouth daily.  atorvastatin (LIPITOR) 40 MG tablet Take 40 mg by mouth daily at 6 PM.    Biotin 10 MG CAPS Take 10 mg by mouth daily.   cholecalciferol (VITAMIN D3) 25 MCG (1000 UT) tablet Take 1,000 Units by mouth daily.   clopidogrel (PLAVIX) 75 MG tablet Take 75 mg by mouth every evening.    diazepam (VALIUM) 5 MG tablet Take 0.5-1 tablets (2.5-5 mg total) by mouth every 8 (eight) hours as needed (vertigo).   HYDROcodone bit-homatropine (HYCODAN) 5-1.5 MG/5ML syrup Take 5 mLs by mouth every 6 (six) hours as needed for cough.   iron polysaccharides (NIFEREX) 150 MG capsule Take 150 mg by mouth every Monday, Wednesday, and Friday.   losartan (COZAAR) 50 MG tablet Take 50 mg by mouth every evening.    Magnesium 250 MG TABS Take 250 mg by mouth at bedtime.   Misc Natural Products (GLUCOSAMINE CHONDROITIN TRIPLE) TABS Take 1 tablet by mouth 2 (two) times daily.   Multiple  Vitamin (MULTIVITAMIN WITH MINERALS) TABS tablet Take 1 tablet by mouth daily.   pantoprazole (PROTONIX) 40 MG tablet TAKE 1 TABLET BY MOUTH TWICE A DAY (Patient taking differently: Take 40 mg by mouth daily.)   potassium chloride SA (K-DUR,KLOR-CON) 20 MEQ tablet Take 20 mEq by mouth every evening.    tamsulosin (FLOMAX) 0.4 MG CAPS capsule Take 1 capsule (0.4 mg total) by mouth daily after supper.   traMADol (ULTRAM) 50 MG tablet Take 1 tablet (50 mg total) by mouth every 6 (six) hours as needed for up to 3 days for severe pain.   No facility-administered encounter medications on file as of 05/17/2022.    Review of Systems  Constitutional:  Positive for activity change. Negative for appetite change, fatigue and fever.  HENT:  Negative for congestion and trouble swallowing.   Eyes:  Negative for visual disturbance.  Respiratory:  Negative for cough, shortness of breath and wheezing.   Cardiovascular:  Negative for chest pain and leg swelling.  Gastrointestinal:  Negative for abdominal distention, abdominal pain, constipation, diarrhea, nausea and vomiting.  Genitourinary:  Negative for hematuria.       Indwelling foley  Musculoskeletal:  Negative for arthralgias, gait problem and joint swelling.  Skin:  Negative for wound.  Neurological:  Positive for weakness. Negative for dizziness and headaches.  Psychiatric/Behavioral:  Negative for dysphoric mood. The patient is not nervous/anxious.     Immunization History  Administered Date(s) Administered   Fluad Quad(high Dose 65+) 05/02/2022   Influenza, High Dose Seasonal PF 03/29/2018   Influenza,inj,Quad PF,6+ Mos 04/25/2016, 05/06/2018   Moderna SARS-COV2 Booster Vaccination 11/19/2020   Pneumococcal Polysaccharide-23 05/02/2000, 09/24/2018   Td 09/08/2013   Pertinent  Health Maintenance Due  Topic Date Due   INFLUENZA VACCINE  Completed      05/14/2022    2:57 AM 05/14/2022    7:39 PM 05/14/2022   11:17 PM 05/15/2022   12:00  PM 05/15/2022    8:00 PM  Fall Risk  Patient Fall Risk Level High fall risk Moderate fall risk High fall risk High fall risk High fall risk   Functional Status Survey:    Vitals:   05/17/22 1610  BP: (!) 144/67  Pulse: (!) 16  Resp: 16  Temp: 98.1 F (36.7 C)  SpO2: 97%  Weight: 165 lb 12.8 oz (75.2 kg)   Body mass index is 28.46 kg/m. Physical Exam Vitals reviewed.  Constitutional:      General: He is not in acute distress. HENT:  Head: Normocephalic.  Eyes:     General:        Right eye: No discharge.        Left eye: No discharge.  Cardiovascular:     Rate and Rhythm: Normal rate and regular rhythm.     Pulses: Normal pulses.     Heart sounds: Normal heart sounds.  Pulmonary:     Effort: Pulmonary effort is normal. No respiratory distress.     Breath sounds: Normal breath sounds. No wheezing.  Abdominal:     General: Bowel sounds are normal. There is no distension.     Palpations: Abdomen is soft.     Tenderness: There is no abdominal tenderness.  Musculoskeletal:     Cervical back: Neck supple.     Right lower leg: Edema present.     Left lower leg: Edema present.     Comments: Non pitting  Skin:    General: Skin is warm and dry.     Capillary Refill: Capillary refill takes less than 2 seconds.  Neurological:     General: No focal deficit present.     Mental Status: He is alert and oriented to person, place, and time.     Motor: Weakness present.     Gait: Gait abnormal.     Comments: walker  Psychiatric:        Mood and Affect: Mood normal.        Behavior: Behavior normal.     Labs reviewed: Recent Labs    05/13/22 1510 05/13/22 1848 05/13/22 2334 05/13/22 2341 05/14/22 0452 05/15/22 0613  NA 143  --   --  142 140 140  K 4.0  --   --  3.4* 3.2* 3.7  CL 108  --   --   --  106 105  CO2 28  --   --   --  26 26  GLUCOSE 114*  --   --   --  90 85  BUN 33*  --   --   --  24* 21  CREATININE 1.43*  --   --   --  1.14 1.05  CALCIUM 9.3  --    --   --  8.6* 9.1  MG  --  2.4  --   --  2.2  --   PHOS  --   --  3.2  --  2.7  --    Recent Labs    05/13/22 1510 05/13/22 2334 05/14/22 0452  AST '22 17 19  '$ ALT '21 22 21  '$ ALKPHOS 70 66 64  BILITOT 0.7 0.9 0.8  PROT 6.7 6.3* 6.1*  ALBUMIN 3.5 3.3* 3.2*   Recent Labs    05/13/22 1510 05/13/22 2341 05/14/22 0452 05/15/22 0105 05/15/22 0613 05/16/22 0110  WBC 9.5  --  7.5 TEST REQUEST RECEIVED WITHOUT APPROPRIATE SPECIMEN 7.3 8.7  NEUTROABS 7.3  --   --   --  5.0 6.4  HGB 12.6*   < > 12.2*  --  13.5 12.6*  HCT 37.7*   < > 39.4  --  40.6 38.4*  MCV 85.1  --  89.5  --  83.2 83.3  PLT 245  --  190  --  195 189   < > = values in this interval not displayed.   Lab Results  Component Value Date   TSH 1.104 05/13/2022   Lab Results  Component Value Date   HGBA1C 5.5 11/23/2016   Lab Results  Component Value Date  CHOL 108 11/23/2016   HDL 45 11/23/2016   LDLCALC 44 11/23/2016   TRIG 94 11/23/2016   CHOLHDL 2.4 11/23/2016    Significant Diagnostic Results in last 30 days:  MR LUMBAR SPINE WO CONTRAST  Result Date: 05/14/2022 CLINICAL DATA:  Lumbar radiculopathy EXAM: MRI LUMBAR SPINE WITHOUT CONTRAST TECHNIQUE: Multiplanar, multisequence MR imaging of the lumbar spine was performed. No intravenous contrast was administered. COMPARISON:  None Available. FINDINGS: Segmentation:  Standard. Alignment:  Physiologic. Vertebrae: Low STIR signal, hyperintense T1/T2-weighted signal within the L1 vertebral body with approximately 25% height loss. Otherwise normal vertebrae. Conus medullaris and cauda equina: Conus extends to the L1 level. Conus and cauda equina appear normal. Paraspinal and other soft tissues: Progression of left renal atrophy Disc levels: T12-L1: Small central disc protrusion without stenosis L1-L2: Small central disc protrusion. No spinal canal stenosis. No neural foraminal stenosis. L2-L3: Small disc bulge. No spinal canal stenosis. No neural foraminal stenosis.  L3-L4: Minimal disc bulge. No spinal canal stenosis. No neural foraminal stenosis. L4-L5: Moderate facet hypertrophy and small disc bulge. No spinal canal stenosis. No neural foraminal stenosis. L5-S1: Disc space narrowing with left asymmetric bulge. No spinal canal stenosis. Left neural foraminal stenosis. Visualized sacrum: Normal. IMPRESSION: 1. L1 height loss without bone marrow edema. This may be a non acute compression fracture in the setting of hemangioma. 2. Mild left L5-S1 neural foraminal stenosis. 3. Unchanged moderate L4-5 facet arthrosis, which may serve as a source of local low back pain. Electronically Signed   By: Ulyses Jarred M.D.   On: 05/14/2022 02:21   US RENAL  Result Date: 05/14/2022 CLINICAL DATA:  Acute kidney injury EXAM: RENAL / URINARY TRACT ULTRASOUND COMPLETE COMPARISON:  02/15/2018 FINDINGS: Right Kidney: Renal measurements: 10.9 x 5.2 x 7.2 cm = volume: 212 mL. Normal echotexture. Mild right hydronephrosis. No mass. Left Kidney: Renal measurements: 9.7 x 4.8 x 5.1 cm = volume: 123 mL. 1.4 cm cyst in the midpole. Normal echotexture. No hydronephrosis. Bladder: Appears normal for degree of bladder distention. Other: None. IMPRESSION: Mild right hydronephrosis. Electronically Signed   By: Rolm Baptise M.D.   On: 05/14/2022 00:40   DG Chest Port 1 View  Result Date: 05/13/2022 CLINICAL DATA:  Weight loss, weakness.  Fall 1 week ago. EXAM: ABDOMEN - 1 VIEW; PORTABLE CHEST - 1 VIEW COMPARISON:  01/25/2018, 05/02/2022. FINDINGS: The heart and mediastinal contour are stable. There is atherosclerotic calcification of the aorta. No consolidation, effusion, or pneumothorax. No acute osseous abnormality. The bowel gas pattern is normal. A vascular stent is present over the lumbar spine. No radio-opaque calculi. Degenerative changes are present in the thoracolumbar spine. IMPRESSION: 1. No cardiopulmonary process. 2. Nonobstructive bowel-gas pattern. Electronically Signed   By: Brett Fairy M.D.   On: 05/13/2022 22:34   DG Abd 1 View  Result Date: 05/13/2022 CLINICAL DATA:  Weight loss, weakness.  Fall 1 week ago. EXAM: ABDOMEN - 1 VIEW; PORTABLE CHEST - 1 VIEW COMPARISON:  01/25/2018, 05/02/2022. FINDINGS: The heart and mediastinal contour are stable. There is atherosclerotic calcification of the aorta. No consolidation, effusion, or pneumothorax. No acute osseous abnormality. The bowel gas pattern is normal. A vascular stent is present over the lumbar spine. No radio-opaque calculi. Degenerative changes are present in the thoracolumbar spine. IMPRESSION: 1. No cardiopulmonary process. 2. Nonobstructive bowel-gas pattern. Electronically Signed   By: Brett Fairy M.D.   On: 05/13/2022 22:34   CT HEAD WO CONTRAST (5MM)  Result Date: 05/02/2022 CLINICAL DATA:  Status post trauma. EXAM: CT HEAD WITHOUT CONTRAST TECHNIQUE: Contiguous axial images were obtained from the base of the skull through the vertex without intravenous contrast. RADIATION DOSE REDUCTION: This exam was performed according to the departmental dose-optimization program which includes automated exposure control, adjustment of the mA and/or kV according to patient size and/or use of iterative reconstruction technique. COMPARISON:  November 22, 2016 FINDINGS: Brain: There is mild to moderate severity cerebral atrophy with widening of the extra-axial spaces and ventricular dilatation. There are areas of decreased attenuation within the white matter tracts of the supratentorial brain, consistent with microvascular disease changes. Vascular: No hyperdense vessel or unexpected calcification. Skull: Normal. Negative for fracture or focal lesion. Sinuses/Orbits: No acute finding. Other: None. IMPRESSION: 1. No acute intracranial abnormality. 2. Cerebral atrophy and microvascular disease changes of the supratentorial brain. Electronically Signed   By: Virgina Norfolk M.D.   On: 05/02/2022 21:42   DG Chest Portable 1  View  Result Date: 05/02/2022 CLINICAL DATA:  Fall. EXAM: PORTABLE CHEST 1 VIEW COMPARISON:  January 06, 2021. FINDINGS: The heart size and mediastinal contours are within normal limits. Both lungs are clear. The visualized skeletal structures are unremarkable. IMPRESSION: No active disease. Electronically Signed   By: Marijo Conception M.D.   On: 05/02/2022 21:24    Assessment/Plan 1. AKI (acute kidney injury) ([place]) - improved - creatinine 1.43> 1.05 at discharge - bmp   2. Acute urinary retention - Bladder scan 500 cc in ED> foley placed - UOP stable, urine yellow/clear  - f/u urology for voiding trial 11/01 - cont Flomax  3. Gait abnormality - ongoing - thought to be related to neuropathy - cont PT/OT  4. Neuropathy - MRI lumbar spine noted non acute compression fracture in the setting of hemangioma, L1 height loss, moderate L4-L5 facet arthrosis - followed by neurosurgery  5. Essential hypertension - controlled - off HCTZ due to age/falls - cont amlodipine, atenolol, and cozaar  6. HYPERCHOLESTEROLEMIA - cont statin  7. Peripheral arterial disease ([facility]) - cont asa and statin  8. Hypokalemia - K+ 3.2- replaced in hospital - bmp    Family/ staff Communication: plan discussed with patient and nurse  Labs/tests ordered:  bmp

## 2022-05-22 ENCOUNTER — Encounter: Payer: Self-pay | Admitting: Internal Medicine

## 2022-05-22 ENCOUNTER — Non-Acute Institutional Stay (SKILLED_NURSING_FACILITY): Payer: Medicare Other | Admitting: Internal Medicine

## 2022-05-22 DIAGNOSIS — I10 Essential (primary) hypertension: Secondary | ICD-10-CM | POA: Diagnosis not present

## 2022-05-22 DIAGNOSIS — N179 Acute kidney failure, unspecified: Secondary | ICD-10-CM | POA: Diagnosis not present

## 2022-05-22 DIAGNOSIS — R338 Other retention of urine: Secondary | ICD-10-CM

## 2022-05-22 DIAGNOSIS — G629 Polyneuropathy, unspecified: Secondary | ICD-10-CM

## 2022-05-22 DIAGNOSIS — R29898 Other symptoms and signs involving the musculoskeletal system: Secondary | ICD-10-CM | POA: Diagnosis not present

## 2022-05-22 DIAGNOSIS — E7800 Pure hypercholesterolemia, unspecified: Secondary | ICD-10-CM

## 2022-05-22 LAB — BASIC METABOLIC PANEL
BUN: 16 (ref 4–21)
CO2: 28 — AB (ref 13–22)
Chloride: 106 (ref 99–108)
Creatinine: 1 (ref 0.6–1.3)
Glucose: 165
Potassium: 4.7 mEq/L (ref 3.5–5.1)
Sodium: 142 (ref 137–147)

## 2022-05-22 LAB — COMPREHENSIVE METABOLIC PANEL
Calcium: 9.3 (ref 8.7–10.7)
eGFR: 72

## 2022-05-22 NOTE — Progress Notes (Signed)
Provider:   Location:  [facility] Room Number: 160-A Place of Service:  SNF (31)  PCP: Adrian Prince, MD Patient Care Team: Adrian Prince, MD as PCP - General (Endocrinology) Runell Gess, MD as PCP - Cardiology (Cardiology)  Extended Emergency Contact Information Primary Emergency Contact: [name] Address: [address]          [place], [place] [address] [place] of [place] Home Phone: [phone] Mobile Phone: [phone] Relation: Spouse  Code Status:  Goals of Care: Advanced Directive information    05/22/2022   10:08 AM  Advanced Directives  Does Patient Have a Medical Advance Directive? Yes  Type of Estate agent of [place];Living will  Does patient want to make changes to medical advance directive? No - Patient declined  Copy of Healthcare Power of Attorney in Chart? Yes - validated most recent copy scanned in chart (See row information)      Chief Complaint  Patient presents with   New Admit To SNF    HPI: Patient is a 88 y.o. male seen today for admission to SNF  Admitted in the hospital 10/14-10/17  Patient has h/o hypertension, hyperlipidemia, PAD, CAD  Per patient and his wife they went to ED because he was getting progressively weaker and they have admission he was unable to get up and do his ADLs which is unusual for him On the baseline patient is able to walk with his walker uses scooter for long distance. He denies any abdominal pain urinary issues .  In the ED was found to have a 500 cc in his bladder and a Foley catheter was placed .  MRI pf lumbar spine show L1 Possible nonacute compression fracture And L4-5 stenosis Renal US showed Mild Right hydronephrosis  CT scan of his head done on 10/03 was negative for any acute process Patient was also taken off his HCTZ.  Still feels blood pressure has been running high.  Systolic has been more than 180.  Patient got  2 doses of clonidine to help with blood pressure His other complaint is feeling weak he is still help needing help with his ADLs.  Denies any dizziness or back pain.  Does have a history of tingling in his legs.  Past Medical History:  Diagnosis Date   AAA (abdominal aortic aneurysm) (HCC)    asymptomatic   AAA (abdominal aortic aneurysm) (HCC) 2010   peripheral  angiogram-- bilateral SFA DISEASE  and 60 to 70% infrarenaal abd. aortic stenosis with 15 -mm gradient   Adenomatous colon polyp 11/2003   CAD (coronary artery disease)    Gait abnormality 02/13/2017   Gastroparesis    pt denies   GERD (gastroesophageal reflux disease)    w/ LPR   History of kidney stones    x2   Hyperlipidemia    Hypertension    IDA (iron deficiency anemia)    Peripheral arterial disease (HCC)    RCEA  by Dr [name]   PUD (peptic ulcer disease)    pt unaware   Vertigo    Past Surgical History:  Procedure Laterality Date   ABDOMINAL AORTOGRAM W/LOWER EXTREMITY Bilateral 08/05/2018   Procedure: ABDOMINAL AORTOGRAM W/LOWER EXTREMITY;  Surgeon: Runell Gess, MD;  Location: [facility] INVASIVE CV [facility];  Service: Cardiovascular;  Laterality: Bilateral;   ABDOMINAL AORTOGRAM W/LOWER EXTREMITY N/A 09/26/2021   Procedure: ABDOMINAL AORTOGRAM W/LOWER EXTREMITY;  Surgeon: Runell Gess, MD;  Location: [facility] CV [facility];  Service: Cardiovascular;  Laterality: N/A;   AORTOGRAM  04/24/2016    Abdominal aortogram, bilateral iliac angiogram, bifemoral runoff   CARDIAC CATHETERIZATION  05/12/2005   RCA   carotid doppler  01/24/2013   RICA endarterectomy,left CCA 0-49%; left bulb and prox ICA 50-69%; bilaateral subclavian < 50%   CAROTID ENDARTERECTOMY  11/01/2010   CATARACT EXTRACTION     COLONOSCOPY     CORONARY ANGIOPLASTY  05/18/2005   2 STENTS distal RCA AND PROXIMAL-MID RCA   5 total stents per pt.   CYSTOSCOPY/URETEROSCOPY/HOLMIUM LASER/STENT PLACEMENT Left 01/11/2018   Procedure: LEFT  URETEROSCOPY/HOLMIUM LASER/STENT PLACEMENT;  Surgeon: Crist Fat, MD;  Location: [facility];  Service: Urology;  Laterality: Left;   DOPPLER ECHOCARDIOGRAPHY  02/07/2012   EF 55%,SHOWED NO ISCHEMIA    HERNIA REPAIR     umbilical   lower arterial  doppler  02/04/2013   aotra 1.5 x 1.5 cm; distal abd aorta 70-99%,proximal common iliac arteries -very stenotic with increased velocities>50%,may be falsely elevated as a result of residual plaque from the distal aorta stenosis   lower extremity doppler  June 18 ,2013   ABI'S ABNORMAL, RABI was 0.88 and LABI 0.75 ,with 3-vessel  run off   NM MYOVIEW LTD  MAY 23,2011   showed no significant ischemia;   NM MYOVIEW LTD  04/22/2008   ef 77%,exercise capcity 6 METS ,exaggerated blood pressure response to exercise   PERIPHERAL VASCULAR CATHETERIZATION N/A 04/24/2016   Procedure: Lower Extremity Angiography;  Surgeon: Runell Gess, MD;  Location: [facility] INVASIVE CV LAB;  Service: Cardiovascular;  Laterality: N/A;   PERIPHERAL VASCULAR CATHETERIZATION  04/24/2016   Procedure: Peripheral Vascular Intervention;  Surgeon: Runell Gess, MD;  Location: [facility] INVASIVE CV LAB;  Service: Cardiovascular;;  Aorta   retrograde central aortic catheterization  05/19/2005   cutting balloon atherectomy, c-circ stenosis with DES STENTING CYPHER   TONSILLECTOMY     TOTAL KNEE ARTHROPLASTY Left 01/03/2019   Procedure: TOTAL KNEE ARTHROPLASTY;  Surgeon: Beverely Low, MD;  Location: [facility];  Service: Orthopedics;  Laterality: Left;  with IS block   TRANSURETHRAL RESECTION OF PROSTATE N/A 09/08/2016   Procedure: TRANSURETHRAL RESECTION OF THE PROSTATE (TURP);  Surgeon: Crist Fat, MD;  Location: [facility];  Service: Urology;  Laterality: N/A;   TRANSURETHRAL RESECTION OF PROSTATE     10-10-17  Dr. Marlou Porch   TRANSURETHRAL RESECTION OF PROSTATE N/A 10/10/2017   Procedure: TRANSURETHRAL RESECTION OF THE PROSTATE (TURP);  Surgeon: Crist Fat, MD;  Location: [facility];   Service: Urology;  Laterality: N/A;   VASECTOMY      reports that he quit smoking about 53 years ago. His smoking use included cigarettes. He has a 40.00 pack-year smoking history. He has never used smokeless tobacco. He reports that he does not currently use alcohol after a past usage of about 4.0 standard drinks of alcohol per week. He reports that he does not use drugs. Social History   Socioeconomic History   Marital status: Married    Spouse name: Nelva Bush   Number of children: 2   Years of education: BA   Highest education level: Not on file  Occupational History   Occupation: Retired  Tobacco Use   Smoking status: Former    Packs/day: 2.00    Years: 20.00    Total pack years: 40.00    Types: Cigarettes    Quit date: 07/31/1968    Years since quitting: 53.8   Smokeless tobacco: Never  Vaping Use   Vaping  Use: Never used  Substance and Sexual Activity   Alcohol use: Not Currently    Alcohol/week: 4.0 standard drinks of alcohol    Types: 4 Glasses of wine per week   Drug use: Never   Sexual activity: Not Currently  Other Topics Concern   Not on file  Social History Narrative   Lives with wife   Caffeine use: Decaf coffee, tea   Left handed   Social Determinants of Health   Financial Resource Strain: Not on file  Food Insecurity: Not on file  Transportation Needs: Not on file  Physical Activity: Not on file  Stress: Not on file  Social Connections: Not on file  Intimate Partner Violence: Not on file    Functional Status Survey:    Family History  Problem Relation Age of Onset   Ovarian cancer Sister    Heart disease Father    Brain cancer Brother        Tumor    Health Maintenance  Topic Date Due   Pneumonia Vaccine 9+ Years old (2 - PCV) 09/25/2019   COVID-19 Vaccine (4 - Moderna series) 05/04/2022   TETANUS/TDAP  09/09/2023   INFLUENZA VACCINE  Completed   Zoster Vaccines- Shingrix  Completed   HPV VACCINES  Aged Out    Allergies  Allergen  Reactions   Lisinopril Cough   Niaspan [Niacin Er] Rash    Outpatient Encounter Medications as of 05/22/2022  Medication Sig   acetaminophen (TYLENOL) 500 MG tablet Take 1 tablet (500 mg total) by mouth 2 (two) times daily as needed.   amLODipine (NORVASC) 5 MG tablet Take 2 tablets (10 mg total) by mouth daily.   Apoaequorin (PREVAGEN PO) Take 1 tablet by mouth daily.   aspirin EC 81 MG tablet Take 1 tablet (81 mg total) by mouth 2 (two) times a day.   atenolol (TENORMIN) 50 MG tablet Take 50 mg by mouth daily.   atorvastatin (LIPITOR) 40 MG tablet Take 40 mg by mouth daily at 6 PM.    Biotin 10 MG CAPS Take 10 mg by mouth daily.   cholecalciferol (VITAMIN D3) 25 MCG (1000 UT) tablet Take 1,000 Units by mouth daily.   clopidogrel (PLAVIX) 75 MG tablet Take 75 mg by mouth every evening.    diazepam (VALIUM) 5 MG tablet Take 0.5-1 tablets (2.5-5 mg total) by mouth every 8 (eight) hours as needed (vertigo).   HYDROcodone bit-homatropine (HYCODAN) 5-1.5 MG/5ML syrup Take 5 mLs by mouth every 6 (six) hours as needed for cough.   iron polysaccharides (NIFEREX) 150 MG capsule Take 150 mg by mouth every Monday, Wednesday, and Friday.   losartan (COZAAR) 50 MG tablet Take 50 mg by mouth every evening.    Magnesium 250 MG TABS Take 250 mg by mouth at bedtime.   Misc Natural Products (GLUCOSAMINE CHONDROITIN TRIPLE) TABS Take 1 tablet by mouth 2 (two) times daily.   Multiple Vitamin (MULTIVITAMIN WITH MINERALS) TABS tablet Take 1 tablet by mouth daily.   pantoprazole (PROTONIX) 40 MG tablet TAKE 1 TABLET BY MOUTH TWICE A DAY   potassium chloride SA (K-DUR,KLOR-CON) 20 MEQ tablet Take 20 mEq by mouth every evening.    tamsulosin (FLOMAX) 0.4 MG CAPS capsule Take 1 capsule (0.4 mg total) by mouth daily after supper.   traMADol (ULTRAM) 50 MG tablet Take 50 mg by mouth every 6 (six) hours as needed for severe pain.   No facility-administered encounter medications on file as of 05/22/2022.    Review  of Systems  Vitals:   05/22/22 0957  BP: (!) 160/84  Pulse: 70  Resp: 18  Temp: 98 F (36.7 C)  SpO2: 93%  Weight: 165 lb 12.8 oz (75.2 kg)  Height: 5\' 4"  (1.626 m)   Body mass index is 28.46 kg/m. Physical Exam Vitals reviewed.  Constitutional:      Appearance: Normal appearance.  HENT:     Head: Normocephalic.     Nose: Nose normal.     Mouth/Throat:     Mouth: Mucous membranes are moist.     Pharynx: Oropharynx is clear.  Eyes:     Pupils: Pupils are equal, round, and reactive to light.  Cardiovascular:     Rate and Rhythm: Normal rate and regular rhythm.     Pulses: Normal pulses.     Heart sounds: No murmur heard. Pulmonary:     Effort: Pulmonary effort is normal. No respiratory distress.     Breath sounds: Rales present.  Abdominal:     General: Abdomen is flat. Bowel sounds are normal.     Palpations: Abdomen is soft.  Musculoskeletal:        General: No swelling.     Cervical back: Neck supple.  Skin:    General: Skin is warm.  Neurological:     General: No focal deficit present.     Mental Status: He is alert and oriented to person, place, and time.  Psychiatric:        Mood and Affect: Mood normal.        Thought Content: Thought content normal.    Labs reviewed: Basic Metabolic Panel: Recent Labs    05/13/22 1510 05/13/22 1848 05/13/22 2334 05/13/22 2341 05/14/22 0452 05/15/22 0613  NA 143  --   --  142 140 140  K 4.0  --   --  3.4* 3.2* 3.7  CL 108  --   --   --  106 105  CO2 28  --   --   --  26 26  GLUCOSE 114*  --   --   --  90 85  BUN 33*  --   --   --  24* 21  CREATININE 1.43*  --   --   --  1.14 1.05  CALCIUM 9.3  --   --   --  8.6* 9.1  MG  --  2.4  --   --  2.2  --   PHOS  --   --  3.2  --  2.7  --    Liver Function Tests: Recent Labs    05/13/22 1510 05/13/22 2334 05/14/22 0452  AST 22 17 19   ALT 21 22 21   ALKPHOS 70 66 64  BILITOT 0.7 0.9 0.8  PROT 6.7 6.3* 6.1*  ALBUMIN 3.5 3.3* 3.2*   No results for input(s):  "LIPASE", "AMYLASE" in the last 8760 hours. Recent Labs    05/13/22 2334  AMMONIA 18   CBC: Recent Labs    05/13/22 1510 05/13/22 2341 05/14/22 0452 05/15/22 0105 05/15/22 0613 05/16/22 0110  WBC 9.5  --  7.5 TEST REQUEST RECEIVED WITHOUT APPROPRIATE SPECIMEN 7.3 8.7  NEUTROABS 7.3  --   --   --  5.0 6.4  HGB 12.6*   < > 12.2*  --  13.5 12.6*  HCT 37.7*   < > 39.4  --  40.6 38.4*  MCV 85.1  --  89.5  --  83.2 83.3  PLT 245  --  190  --  195 189   < > = values in this interval not displayed.   Cardiac Enzymes: Recent Labs    05/13/22 2334  CKTOTAL 50   BNP: Invalid input(s): "POCBNP" Lab Results  Component Value Date   HGBA1C 5.5 11/23/2016   Lab Results  Component Value Date   TSH 1.104 05/13/2022   Lab Results  Component Value Date   VITAMINB12 571 05/13/2022   Lab Results  Component Value Date   FOLATE 32.5 05/13/2022   Lab Results  Component Value Date   IRON 63 05/13/2022   TIBC 323 05/13/2022   FERRITIN 23 (L) 05/13/2022    Imaging and Procedures obtained prior to SNF admission: MR LUMBAR SPINE WO CONTRAST  Result Date: 05/14/2022 CLINICAL DATA:  Lumbar radiculopathy EXAM: MRI LUMBAR SPINE WITHOUT CONTRAST TECHNIQUE: Multiplanar, multisequence MR imaging of the lumbar spine was performed. No intravenous contrast was administered. COMPARISON:  None Available. FINDINGS: Segmentation:  Standard. Alignment:  Physiologic. Vertebrae: Low STIR signal, hyperintense T1/T2-weighted signal within the L1 vertebral body with approximately 25% height loss. Otherwise normal vertebrae. Conus medullaris and cauda equina: Conus extends to the L1 level. Conus and cauda equina appear normal. Paraspinal and other soft tissues: Progression of left renal atrophy Disc levels: T12-L1: Small central disc protrusion without stenosis L1-L2: Small central disc protrusion. No spinal canal stenosis. No neural foraminal stenosis. L2-L3: Small disc bulge. No spinal canal stenosis. No  neural foraminal stenosis. L3-L4: Minimal disc bulge. No spinal canal stenosis. No neural foraminal stenosis. L4-L5: Moderate facet hypertrophy and small disc bulge. No spinal canal stenosis. No neural foraminal stenosis. L5-S1: Disc space narrowing with left asymmetric bulge. No spinal canal stenosis. Left neural foraminal stenosis. Visualized sacrum: Normal. IMPRESSION: 1. L1 height loss without bone marrow edema. This may be a non acute compression fracture in the setting of hemangioma. 2. Mild left L5-S1 neural foraminal stenosis. 3. Unchanged moderate L4-5 facet arthrosis, which may serve as a source of local low back pain. Electronically Signed   By: Deatra Robinson M.D.   On: 05/14/2022 02:21   US RENAL  Result Date: 05/14/2022 CLINICAL DATA:  Acute kidney injury EXAM: RENAL / URINARY TRACT ULTRASOUND COMPLETE COMPARISON:  02/15/2018 FINDINGS: Right Kidney: Renal measurements: 10.9 x 5.2 x 7.2 cm = volume: 212 mL. Normal echotexture. Mild right hydronephrosis. No mass. Left Kidney: Renal measurements: 9.7 x 4.8 x 5.1 cm = volume: 123 mL. 1.4 cm cyst in the midpole. Normal echotexture. No hydronephrosis. Bladder: Appears normal for degree of bladder distention. Other: None. IMPRESSION: Mild right hydronephrosis. Electronically Signed   By: Charlett Nose M.D.   On: 05/14/2022 00:40   DG Chest Port 1 View  Result Date: 05/13/2022 CLINICAL DATA:  Weight loss, weakness.  Fall 1 week ago. EXAM: ABDOMEN - 1 VIEW; PORTABLE CHEST - 1 VIEW COMPARISON:  01/25/2018, 05/02/2022. FINDINGS: The heart and mediastinal contour are stable. There is atherosclerotic calcification of the aorta. No consolidation, effusion, or pneumothorax. No acute osseous abnormality. The bowel gas pattern is normal. A vascular stent is present over the lumbar spine. No radio-opaque calculi. Degenerative changes are present in the thoracolumbar spine. IMPRESSION: 1. No cardiopulmonary process. 2. Nonobstructive bowel-gas pattern.  Electronically Signed   By: Thornell Sartorius M.D.   On: 05/13/2022 22:34   DG Abd 1 View  Result Date: 05/13/2022 CLINICAL DATA:  Weight loss, weakness.  Fall 1 week ago. EXAM: ABDOMEN - 1 VIEW; PORTABLE CHEST - 1 VIEW COMPARISON:  01/25/2018, 05/02/2022. FINDINGS: The heart  and mediastinal contour are stable. There is atherosclerotic calcification of the aorta. No consolidation, effusion, or pneumothorax. No acute osseous abnormality. The bowel gas pattern is normal. A vascular stent is present over the lumbar spine. No radio-opaque calculi. Degenerative changes are present in the thoracolumbar spine. IMPRESSION: 1. No cardiopulmonary process. 2. Nonobstructive bowel-gas pattern. Electronically Signed   By: Thornell Sartorius M.D.   On: 05/13/2022 22:34    Assessment/Plan 1. Essential hypertension Changed Losartan 50 mg  BID Hydralazine prn if SBP more then 180 Also on Norvasc and Tenormin  2. Acute urinary retention Voiding trial  Patient initially refused but now he is ready to try in the morning He is on Flomax Is going to see Urology  3. AKI (acute kidney injury) (HCC) Repeat BMP shows normal Creat  4. Weakness of both lower extremities MRI Lumbar spine negative Therapy   5. PAD On Aspirin and Plavix 6. HYPERCHOLESTEROLEMIA On statin 7 Chronic Cough On Hydrocodone  [place] 8 Iron def anemia Had EGD and Colon in 07/23 On Protonix     Family/ staff Communication:   Labs/tests ordered:

## 2022-05-23 ENCOUNTER — Encounter: Payer: Self-pay | Admitting: Internal Medicine

## 2022-05-29 ENCOUNTER — Encounter: Payer: Self-pay | Admitting: Internal Medicine

## 2022-05-29 ENCOUNTER — Non-Acute Institutional Stay (SKILLED_NURSING_FACILITY): Payer: Medicare Other | Admitting: Internal Medicine

## 2022-05-29 DIAGNOSIS — E7800 Pure hypercholesterolemia, unspecified: Secondary | ICD-10-CM

## 2022-05-29 DIAGNOSIS — R29898 Other symptoms and signs involving the musculoskeletal system: Secondary | ICD-10-CM

## 2022-05-29 DIAGNOSIS — R338 Other retention of urine: Secondary | ICD-10-CM

## 2022-05-29 DIAGNOSIS — I10 Essential (primary) hypertension: Secondary | ICD-10-CM | POA: Diagnosis not present

## 2022-05-29 DIAGNOSIS — G629 Polyneuropathy, unspecified: Secondary | ICD-10-CM

## 2022-05-29 DIAGNOSIS — I739 Peripheral vascular disease, unspecified: Secondary | ICD-10-CM

## 2022-05-29 NOTE — Progress Notes (Signed)
Location:  [facility] Room Number: Rehab 160A Place of Service:  SNF [phone]) Provider:  Dr. Erenest Blank, MD  Patient Care Team: [name], MD as PCP - General (Endocrinology) Lorretta Harp, MD as PCP - Cardiology (Cardiology)  Extended Emergency Contact Information Primary Emergency Contact: Marjie Skiff Address: [address]          [place], [place] [address] Johnnette Litter of [place] Phone: [phone] Mobile Phone: [phone] Relation: Spouse  Code Status:  DNR Goals of care: Advanced Directive information    05/22/2022   10:08 AM  Advanced Directives  Does Patient Have a Medical Advance Directive? Yes  Type of Paramedic of [place];Living will  Does patient want to make changes to medical advance directive? No - Patient declined  Copy of [place] in Chart? Yes - validated most recent copy scanned in chart (See row information)     Chief Complaint  Patient presents with   Acute Visit    Blood Pressure.     HPI:  Pt is a 88 y.o. male seen today for an acute visit for Hypertension and Discharge planning  Patient in Rehab and wants to go home  Admitted in the hospital 10/14-10/17   Patient has h/o hypertension, hyperlipidemia, PAD, CAD   Per patient and his wife they went to ED because he was getting progressively weaker and ON DAY OF admission he was unable to get up and do his ADLs which is unusual for him On the baseline patient is able to walk with his walker uses scooter for long distance. He denies any abdominal pain urinary issues .  In the ED was found to have a 500 cc in his bladder and a Foley catheter was placed .  MRI pf lumbar spine show L1 Possible nonacute compression fracture And L4-5 stenosis Renal US showed Mild Right hydronephrosis   CT scan of his head done on 10/03 was negative for any acute process Patient was also  taken off his HCTZ.   IN Rehab He is now walking with no assist and doing his ADLS His foley was also taken out and he is now voiding normal Does have appointment with urology on Wed and then he wants to go home BP still running high SBP more then 170  Past Medical History:  Diagnosis Date   AAA (abdominal aortic aneurysm) ([place])    asymptomatic   AAA (abdominal aortic aneurysm) ([place]) 2010   peripheral  angiogram-- bilateral SFA DISEASE  and 60 to 70% infrarenaal abd. aortic stenosis with 15 -mm gradient   Adenomatous colon polyp 11/2003   CAD (coronary artery disease)    Gait abnormality 02/13/2017   Gastroparesis    pt denies   GERD (gastroesophageal reflux disease)    w/ LPR   History of kidney stones    x2   Hyperlipidemia    Hypertension    IDA (iron deficiency anemia)    Peripheral arterial disease ([place])    RCEA  by Dr Lemar Livings   PUD (peptic ulcer disease)    pt unaware   Vertigo    Past Surgical History:  Procedure Laterality Date   ABDOMINAL AORTOGRAM W/LOWER EXTREMITY Bilateral 08/05/2018   Procedure: ABDOMINAL AORTOGRAM W/LOWER EXTREMITY;  Surgeon: Lorretta Harp, MD;  Location: [facility];  Service: Cardiovascular;  Laterality: Bilateral;   ABDOMINAL AORTOGRAM W/LOWER EXTREMITY N/A 09/26/2021   Procedure: ABDOMINAL AORTOGRAM W/LOWER EXTREMITY;  Surgeon: Lorretta Harp, MD;  Location: [facility];  Service: Cardiovascular;  Laterality: N/A;   AORTOGRAM  04/24/2016    Abdominal aortogram, bilateral iliac angiogram, bifemoral runoff   CARDIAC CATHETERIZATION  05/12/2005   RCA   carotid doppler  01/24/2013   RICA endarterectomy,left CCA 0-49%; left bulb and prox ICA 50-69%; bilaateral subclavian < 50%   CAROTID ENDARTERECTOMY  11/01/2010   CATARACT EXTRACTION     COLONOSCOPY     CORONARY ANGIOPLASTY  05/18/2005   2 STENTS distal RCA AND PROXIMAL-MID RCA   5 total stents per pt.   CYSTOSCOPY/URETEROSCOPY/HOLMIUM LASER/STENT PLACEMENT Left  01/11/2018   Procedure: LEFT URETEROSCOPY/HOLMIUM LASER/STENT PLACEMENT;  Surgeon: Ardis Hughs, MD;  Location: [facility];  Service: Urology;  Laterality: Left;   DOPPLER ECHOCARDIOGRAPHY  02/07/2012   EF 55%,SHOWED NO ISCHEMIA    HERNIA REPAIR     umbilical   lower arterial  doppler  02/04/2013   aotra 1.5 x 1.5 cm; distal abd aorta 70-99%,proximal common iliac arteries -very stenotic with increased velocities>50%,may be falsely elevated as a result of residual plaque from the distal aorta stenosis   lower extremity doppler  June 18 ,2013   ABI'S ABNORMAL, RABI was 0.88 and LABI 0.75 ,with 3-vessel  run off   NM MYOVIEW LTD  MAY 23,2011   showed no significant ischemia;   NM MYOVIEW LTD  04/22/2008   ef 77%,exercise capcity 6 METS ,exaggerated blood pressure response to exercise   PERIPHERAL VASCULAR CATHETERIZATION N/A 04/24/2016   Procedure: Lower Extremity Angiography;  Surgeon: Lorretta Harp, MD;  Location: [facility];  Service: Cardiovascular;  Laterality: N/A;   PERIPHERAL VASCULAR CATHETERIZATION  04/24/2016   Procedure: Peripheral Vascular Intervention;  Surgeon: Lorretta Harp, MD;  Location: [facility];  Service: Cardiovascular;;  Aorta   retrograde central aortic catheterization  05/19/2005   cutting balloon atherectomy, c-circ stenosis with DES STENTING CYPHER   TONSILLECTOMY     TOTAL KNEE ARTHROPLASTY Left 01/03/2019   Procedure: TOTAL KNEE ARTHROPLASTY;  Surgeon: Netta Cedars, MD;  Location: [facility];  Service: Orthopedics;  Laterality: Left;  with IS block   TRANSURETHRAL RESECTION OF PROSTATE N/A 09/08/2016   Procedure: TRANSURETHRAL RESECTION OF THE PROSTATE (TURP);  Surgeon: Ardis Hughs, MD;  Location: [facility];  Service: Urology;  Laterality: N/A;   TRANSURETHRAL RESECTION OF PROSTATE     10-10-17  Dr. Louis Meckel   TRANSURETHRAL RESECTION OF PROSTATE N/A 10/10/2017   Procedure: TRANSURETHRAL RESECTION OF THE PROSTATE (TURP);  Surgeon: Ardis Hughs, MD;  Location: [facility];  Service: Urology;  Laterality: N/A;   VASECTOMY      Allergies  Allergen Reactions   Lisinopril Cough   Niaspan [Niacin Er] Rash    Outpatient Encounter Medications as of 05/29/2022  Medication Sig   acetaminophen (TYLENOL) 500 MG tablet Take 1 tablet (500 mg total) by mouth 2 (two) times daily as needed.   amLODipine (NORVASC) 5 MG tablet Take 2 tablets (10 mg total) by mouth daily.   Apoaequorin (PREVAGEN PO) Take 1 tablet by mouth daily.   aspirin EC 81 MG tablet Take 1 tablet (81 mg total) by mouth 2 (two) times a day.   atenolol (TENORMIN) 50 MG tablet Take 50 mg by mouth daily.   atorvastatin (LIPITOR) 40 MG tablet Take 40 mg by mouth daily at 6 PM.    Biotin 10 MG CAPS Take 10 mg by mouth daily.   cholecalciferol (VITAMIN  D3) 25 MCG (1000 UT) tablet Take 1,000 Units by mouth daily.   clopidogrel (PLAVIX) 75 MG tablet Take 75 mg by mouth every evening.    diazepam (VALIUM) 5 MG tablet Take 2.5 mg by mouth every 8 (eight) hours as needed for anxiety.   hydrALAZINE (APRESOLINE) 10 MG tablet Take 10 mg by mouth 3 (three) times daily. As needed only if SBP>180   hydrochlorothiazide (MICROZIDE) 12.5 MG capsule Take 12.5 mg by mouth daily.   HYDROcodone bit-homatropine (HYCODAN) 5-1.5 MG/5ML syrup Take 5 mLs by mouth every 6 (six) hours as needed for cough.   iron polysaccharides (NIFEREX) 150 MG capsule Take 150 mg by mouth every Monday, Wednesday, and Friday.   losartan (COZAAR) 50 MG tablet Take 50 mg by mouth 2 (two) times daily.   Magnesium 250 MG TABS Take 250 mg by mouth at bedtime.   Misc Natural Products (GLUCOSAMINE CHONDROITIN TRIPLE) TABS Take 1 tablet by mouth 2 (two) times daily.   Multiple Vitamin (MULTIVITAMIN WITH MINERALS) TABS tablet Take 1 tablet by mouth daily.   pantoprazole (PROTONIX) 40 MG tablet TAKE 1 TABLET BY MOUTH TWICE A DAY   potassium chloride SA (K-DUR,KLOR-CON) 20 MEQ tablet Take 20 mEq by mouth every evening.    tamsulosin  (FLOMAX) 0.4 MG CAPS capsule Take 1 capsule (0.4 mg total) by mouth daily after supper.   traMADol (ULTRAM) 50 MG tablet Take 50 mg by mouth every 6 (six) hours as needed for severe pain.   [DISCONTINUED] diazepam (VALIUM) 5 MG tablet Take 0.5-1 tablets (2.5-5 mg total) by mouth every 8 (eight) hours as needed (vertigo). (Patient taking differently: Take 2.5 mg by mouth every 8 (eight) hours as needed (vertigo).)   No facility-administered encounter medications on file as of 05/29/2022.    Review of Systems  Constitutional:  Negative for activity change, appetite change and unexpected weight change.  HENT: Negative.    Respiratory:  Negative for cough and shortness of breath.   Cardiovascular:  Negative for leg swelling.  Gastrointestinal:  Negative for constipation.  Genitourinary:  Negative for frequency.  Musculoskeletal:  Negative for arthralgias, gait problem and myalgias.  Skin: Negative.  Negative for rash.  Neurological:  Negative for dizziness and weakness.  Psychiatric/Behavioral:  Negative for confusion and sleep disturbance.   All other systems reviewed and are negative.   Immunization History  Administered Date(s) Administered   Fluad Quad(high Dose 65+) 05/02/2022   Influenza, High Dose Seasonal PF 03/29/2018   Influenza,inj,Quad PF,6+ Mos 04/25/2016, 05/06/2018   Moderna SARS-COV2 Booster Vaccination 06/15/2020, 11/19/2020, 01/02/2022   Pneumococcal Polysaccharide-23 05/02/2000, 09/24/2018   Td 09/08/2013   Pertinent  Health Maintenance Due  Topic Date Due   INFLUENZA VACCINE  Completed      05/14/2022    2:57 AM 05/14/2022    7:39 PM 05/14/2022   11:17 PM 05/15/2022   12:00 PM 05/15/2022    8:00 PM  Fall Risk  Patient Fall Risk Level High fall risk Moderate fall risk High fall risk High fall risk High fall risk   Functional Status Survey:    Vitals:   05/29/22 1004  BP: (!) 174/61  Pulse: 67  Resp: 18  Temp: 98 F (36.7 C)  SpO2: 94%  Weight: 161  lb 12.8 oz (73.4 kg)  Height: '5\' 4"'$  (1.626 m)   Body mass index is 27.77 kg/m. Physical Exam Vitals reviewed.  Constitutional:      Appearance: Normal appearance.  HENT:     Head: Normocephalic.  Nose: Nose normal.     Mouth/Throat:     Mouth: Mucous membranes are moist.     Pharynx: Oropharynx is clear.  Eyes:     Pupils: Pupils are equal, round, and reactive to light.  Cardiovascular:     Rate and Rhythm: Normal rate and regular rhythm.     Pulses: Normal pulses.     Heart sounds: No murmur heard. Pulmonary:     Effort: Pulmonary effort is normal. No respiratory distress.     Breath sounds: Normal breath sounds. No rales.  Abdominal:     General: Abdomen is flat. Bowel sounds are normal.     Palpations: Abdomen is soft.  Musculoskeletal:        General: No swelling.     Cervical back: Neck supple.  Skin:    General: Skin is warm.  Neurological:     General: No focal deficit present.     Mental Status: He is alert and oriented to person, place, and time.  Psychiatric:        Mood and Affect: Mood normal.        Thought Content: Thought content normal.     Labs reviewed: Recent Labs    05/13/22 1510 05/13/22 1848 05/13/22 2334 05/13/22 2341 05/14/22 0452 05/15/22 0613 05/22/22 0000  NA 143  --   --    < > 140 140 142  K 4.0  --   --    < > 3.2* 3.7 4.7  CL 108  --   --   --  106 105 106  CO2 28  --   --   --  26 26 28*  GLUCOSE 114*  --   --   --  90 85  --   BUN 33*  --   --   --  24* 21 16  CREATININE 1.43*  --   --   --  1.14 1.05 1.0  CALCIUM 9.3  --   --   --  8.6* 9.1 9.3  MG  --  2.4  --   --  2.2  --   --   PHOS  --   --  3.2  --  2.7  --   --    < > = values in this interval not displayed.   Recent Labs    05/13/22 1510 05/13/22 2334 05/14/22 0452  AST '22 17 19  '$ ALT '21 22 21  '$ ALKPHOS 70 66 64  BILITOT 0.7 0.9 0.8  PROT 6.7 6.3* 6.1*  ALBUMIN 3.5 3.3* 3.2*   Recent Labs    05/13/22 1510 05/13/22 2341 05/14/22 0452  05/15/22 0105 05/15/22 0613 05/16/22 0110  WBC 9.5  --  7.5 TEST REQUEST RECEIVED WITHOUT APPROPRIATE SPECIMEN 7.3 8.7  NEUTROABS 7.3  --   --   --  5.0 6.4  HGB 12.6*   < > 12.2*  --  13.5 12.6*  HCT 37.7*   < > 39.4  --  40.6 38.4*  MCV 85.1  --  89.5  --  83.2 83.3  PLT 245  --  190  --  195 189   < > = values in this interval not displayed.   Lab Results  Component Value Date   TSH 1.104 05/13/2022   Lab Results  Component Value Date   HGBA1C 5.5 11/23/2016   Lab Results  Component Value Date   CHOL 108 11/23/2016   HDL 45 11/23/2016   LDLCALC 44 11/23/2016  TRIG 94 11/23/2016   CHOLHDL 2.4 11/23/2016    Significant Diagnostic Results in last 30 days:  MR LUMBAR SPINE WO CONTRAST  Result Date: 05/14/2022 CLINICAL DATA:  Lumbar radiculopathy EXAM: MRI LUMBAR SPINE WITHOUT CONTRAST TECHNIQUE: Multiplanar, multisequence MR imaging of the lumbar spine was performed. No intravenous contrast was administered. COMPARISON:  None Available. FINDINGS: Segmentation:  Standard. Alignment:  Physiologic. Vertebrae: Low STIR signal, hyperintense T1/T2-weighted signal within the L1 vertebral body with approximately 25% height loss. Otherwise normal vertebrae. Conus medullaris and cauda equina: Conus extends to the L1 level. Conus and cauda equina appear normal. Paraspinal and other soft tissues: Progression of left renal atrophy Disc levels: T12-L1: Small central disc protrusion without stenosis L1-L2: Small central disc protrusion. No spinal canal stenosis. No neural foraminal stenosis. L2-L3: Small disc bulge. No spinal canal stenosis. No neural foraminal stenosis. L3-L4: Minimal disc bulge. No spinal canal stenosis. No neural foraminal stenosis. L4-L5: Moderate facet hypertrophy and small disc bulge. No spinal canal stenosis. No neural foraminal stenosis. L5-S1: Disc space narrowing with left asymmetric bulge. No spinal canal stenosis. Left neural foraminal stenosis. Visualized sacrum:  Normal. IMPRESSION: 1. L1 height loss without bone marrow edema. This may be a non acute compression fracture in the setting of hemangioma. 2. Mild left L5-S1 neural foraminal stenosis. 3. Unchanged moderate L4-5 facet arthrosis, which may serve as a source of local low back pain. Electronically Signed   By: Ulyses Jarred M.D.   On: 05/14/2022 02:21   US RENAL  Result Date: 05/14/2022 CLINICAL DATA:  Acute kidney injury EXAM: RENAL / URINARY TRACT ULTRASOUND COMPLETE COMPARISON:  02/15/2018 FINDINGS: Right Kidney: Renal measurements: 10.9 x 5.2 x 7.2 cm = volume: 212 mL. Normal echotexture. Mild right hydronephrosis. No mass. Left Kidney: Renal measurements: 9.7 x 4.8 x 5.1 cm = volume: 123 mL. 1.4 cm cyst in the midpole. Normal echotexture. No hydronephrosis. Bladder: Appears normal for degree of bladder distention. Other: None. IMPRESSION: Mild right hydronephrosis. Electronically Signed   By: Rolm Baptise M.D.   On: 05/14/2022 00:40   DG Chest Port 1 View  Result Date: 05/13/2022 CLINICAL DATA:  Weight loss, weakness.  Fall 1 week ago. EXAM: ABDOMEN - 1 VIEW; PORTABLE CHEST - 1 VIEW COMPARISON:  01/25/2018, 05/02/2022. FINDINGS: The heart and mediastinal contour are stable. There is atherosclerotic calcification of the aorta. No consolidation, effusion, or pneumothorax. No acute osseous abnormality. The bowel gas pattern is normal. A vascular stent is present over the lumbar spine. No radio-opaque calculi. Degenerative changes are present in the thoracolumbar spine. IMPRESSION: 1. No cardiopulmonary process. 2. Nonobstructive bowel-gas pattern. Electronically Signed   By: Brett Fairy M.D.   On: 05/13/2022 22:34   DG Abd 1 View  Result Date: 05/13/2022 CLINICAL DATA:  Weight loss, weakness.  Fall 1 week ago. EXAM: ABDOMEN - 1 VIEW; PORTABLE CHEST - 1 VIEW COMPARISON:  01/25/2018, 05/02/2022. FINDINGS: The heart and mediastinal contour are stable. There is atherosclerotic calcification of the  aorta. No consolidation, effusion, or pneumothorax. No acute osseous abnormality. The bowel gas pattern is normal. A vascular stent is present over the lumbar spine. No radio-opaque calculi. Degenerative changes are present in the thoracolumbar spine. IMPRESSION: 1. No cardiopulmonary process. 2. Nonobstructive bowel-gas pattern. Electronically Signed   By: Brett Fairy M.D.   On: 05/13/2022 22:34   CT HEAD WO CONTRAST (5MM)  Result Date: 05/02/2022 CLINICAL DATA:  Status post trauma. EXAM: CT HEAD WITHOUT CONTRAST TECHNIQUE: Contiguous axial images were obtained from  the base of the skull through the vertex without intravenous contrast. RADIATION DOSE REDUCTION: This exam was performed according to the departmental dose-optimization program which includes automated exposure control, adjustment of the mA and/or kV according to patient size and/or use of iterative reconstruction technique. COMPARISON:  November 22, 2016 FINDINGS: Brain: There is mild to moderate severity cerebral atrophy with widening of the extra-axial spaces and ventricular dilatation. There are areas of decreased attenuation within the white matter tracts of the supratentorial brain, consistent with microvascular disease changes. Vascular: No hyperdense vessel or unexpected calcification. Skull: Normal. Negative for fracture or focal lesion. Sinuses/Orbits: No acute finding. Other: None. IMPRESSION: 1. No acute intracranial abnormality. 2. Cerebral atrophy and microvascular disease changes of the supratentorial brain. Electronically Signed   By: Virgina Norfolk M.D.   On: 05/02/2022 21:42   DG Chest Portable 1 View  Result Date: 05/02/2022 CLINICAL DATA:  Fall. EXAM: PORTABLE CHEST 1 VIEW COMPARISON:  January 06, 2021. FINDINGS: The heart size and mediastinal contours are within normal limits. Both lungs are clear. The visualized skeletal structures are unremarkable. IMPRESSION: No active disease. Electronically Signed   By: Marijo Conception  M.D.   On: 05/02/2022 21:24    Assessment/Plan 1. Essential hypertension On Max dose of Norvasc and Losartan Also on Tenormin Will Change the HCTZ back to 25 mg Monitor BP in rehab Will need follow up with Dr Forde Dandy after discharge  2. Acute urinary retention Foley out Did well with her Voiding On Flomax now Has appointment with Urology  3. Weakness of both lower extremities Doing well with therapy Plan to continue therapy in his apartment MRI Lumbar spine negative 4. Peripheral arterial disease ([place]) Follows with Dr Gwenlyn Found On aspirin and Plavix  5. HYPERCHOLESTEROLEMIA On statin  6. Chronic Cough On Hydrocodone  [place] 8 Iron def anemia Had EGD and Colon in 07/23 On Protonix 9 Mild Cognitive impairment Does forgets specifics MMSE here was 25/30 Missed on Serial 7 and some in orientations  Family/ staff Communication:   Labs/tests ordered:

## 2022-05-31 LAB — BASIC METABOLIC PANEL
BUN: 22 — AB (ref 4–21)
CO2: 28 — AB (ref 13–22)
Chloride: 104 (ref 99–108)
Creatinine: 1 (ref 0.6–1.3)
Glucose: 97
Potassium: 3.9 mEq/L (ref 3.5–5.1)
Sodium: 142 (ref 137–147)

## 2022-05-31 LAB — COMPREHENSIVE METABOLIC PANEL
Calcium: 9.5 (ref 8.7–10.7)
eGFR: 69

## 2022-06-01 ENCOUNTER — Non-Acute Institutional Stay (SKILLED_NURSING_FACILITY): Payer: Medicare Other | Admitting: Adult Health

## 2022-06-01 ENCOUNTER — Encounter: Payer: Self-pay | Admitting: Adult Health

## 2022-06-01 DIAGNOSIS — R338 Other retention of urine: Secondary | ICD-10-CM

## 2022-06-01 DIAGNOSIS — I739 Peripheral vascular disease, unspecified: Secondary | ICD-10-CM

## 2022-06-01 DIAGNOSIS — I10 Essential (primary) hypertension: Secondary | ICD-10-CM

## 2022-06-01 DIAGNOSIS — E782 Mixed hyperlipidemia: Secondary | ICD-10-CM

## 2022-06-01 DIAGNOSIS — R29898 Other symptoms and signs involving the musculoskeletal system: Secondary | ICD-10-CM

## 2022-06-01 DIAGNOSIS — G3184 Mild cognitive impairment, so stated: Secondary | ICD-10-CM

## 2022-06-01 DIAGNOSIS — D509 Iron deficiency anemia, unspecified: Secondary | ICD-10-CM

## 2022-06-01 NOTE — Progress Notes (Signed)
Location:  Occupational psychologist of Service:  SNF (31)  Provider:  Cindi Carbon, ANP [place] [phone]   PCP: [name], MD Patient Care Team: [name], MD as PCP - General (Endocrinology) Lorretta Harp, MD as PCP - Cardiology (Cardiology)  Extended Emergency Contact Information Primary Emergency Contact: Marjie Skiff Address: [address]          [place], [place] [address] Johnnette Litter of [place] Phone: [phone] Mobile Phone: [phone] Relation: Spouse   Goals of care:  Advanced Directive information    05/22/2022   10:08 AM  Advanced Directives  Does Patient Have a Medical Advance Directive? Yes  Type of Paramedic of [place];Living will  Does patient want to make changes to medical advance directive? No - Patient declined  Copy of [place] in Chart? Yes - validated most recent copy scanned in chart (See row information)     Allergies  Allergen Reactions   Lisinopril Cough   Niaspan [Niacin Er] Rash    Chief Complaint  Patient presents with   Discharge Note    HPI:  88 y.o. male seen for discharge from [facility] returning to independent living.  He has a PMH significant for PAD, HTN, HLD, CAD, memory loss, AAA, IDA, PUD. He presented with weakness and urinary retention to the hospital.  He was treated for AKI with IVF, which resolved, and a foley was placed. Renal US showed right hydronephrosis. UA did not show infection. Due to lower ext weakness an MRI was obtained which showed MRI lumbar spine showed nonacute compression fracture in the setting of hemangioma, L1 height loss.  Also showed unchanged moderate L4-L5 facet arthrosis Neurosurgery consulted, discussed with Dr. Venetia Constable on 10/16, outpatient follow-up, no further recommendation, likely from peripheral neuropathy. He came to [facility] and received therapy. He is now  ambulatory with a walker. Needs help with bathing and dressing which his wife did prior to admission. Home mods are being made to the BR.  His BP has been elevated during his stay. HCTZ was added back and dose increased. PRN Hydralazine added as well. BP still 170-200 at times, comes down to 140-160s with hydralazine. He has no cp, edema, or sob.  MCI MMSE 25/30 during admission Has chronic cough on hycodan. On asa and plavix for PAD followed by Dr Gwenlyn Found  Followed up with urology, foley out and patient is voiding well. On flomax.    Past Medical History:  Diagnosis Date   AAA (abdominal aortic aneurysm) ([place])    asymptomatic   AAA (abdominal aortic aneurysm) ([place]) 2010   peripheral  angiogram-- bilateral SFA DISEASE  and 60 to 70% infrarenaal abd. aortic stenosis with 15 -mm gradient   Adenomatous colon polyp 11/2003   CAD (coronary artery disease)    Gait abnormality 02/13/2017   Gastroparesis    pt denies   GERD (gastroesophageal reflux disease)    w/ LPR   History of kidney stones    x2   Hyperlipidemia    Hypertension    IDA (iron deficiency anemia)    Peripheral arterial disease ([place])    RCEA  by Dr Lemar Livings   PUD (peptic ulcer disease)    pt unaware   Vertigo     Past Surgical History:  Procedure Laterality Date   ABDOMINAL AORTOGRAM W/LOWER EXTREMITY Bilateral 08/05/2018   Procedure: ABDOMINAL AORTOGRAM W/LOWER EXTREMITY;  Surgeon: Lorretta Harp, MD;  Location:  [facility];  Service: Cardiovascular;  Laterality: Bilateral;   ABDOMINAL AORTOGRAM W/LOWER EXTREMITY N/A 09/26/2021   Procedure: ABDOMINAL AORTOGRAM W/LOWER EXTREMITY;  Surgeon: Lorretta Harp, MD;  Location: [facility];  Service: Cardiovascular;  Laterality: N/A;   AORTOGRAM  04/24/2016    Abdominal aortogram, bilateral iliac angiogram, bifemoral runoff   CARDIAC CATHETERIZATION  05/12/2005   RCA   carotid doppler  01/24/2013   RICA endarterectomy,left CCA 0-49%; left bulb and prox  ICA 50-69%; bilaateral subclavian < 50%   CAROTID ENDARTERECTOMY  11/01/2010   CATARACT EXTRACTION     COLONOSCOPY     CORONARY ANGIOPLASTY  05/18/2005   2 STENTS distal RCA AND PROXIMAL-MID RCA   5 total stents per pt.   CYSTOSCOPY/URETEROSCOPY/HOLMIUM LASER/STENT PLACEMENT Left 01/11/2018   Procedure: LEFT URETEROSCOPY/HOLMIUM LASER/STENT PLACEMENT;  Surgeon: Ardis Hughs, MD;  Location: [facility];  Service: Urology;  Laterality: Left;   DOPPLER ECHOCARDIOGRAPHY  02/07/2012   EF 55%,SHOWED NO ISCHEMIA    HERNIA REPAIR     umbilical   lower arterial  doppler  02/04/2013   aotra 1.5 x 1.5 cm; distal abd aorta 70-99%,proximal common iliac arteries -very stenotic with increased velocities>50%,may be falsely elevated as a result of residual plaque from the distal aorta stenosis   lower extremity doppler  June 18 ,2013   ABI'S ABNORMAL, RABI was 0.88 and LABI 0.75 ,with 3-vessel  run off   NM MYOVIEW LTD  MAY 23,2011   showed no significant ischemia;   NM MYOVIEW LTD  04/22/2008   ef 77%,exercise capcity 6 METS ,exaggerated blood pressure response to exercise   PERIPHERAL VASCULAR CATHETERIZATION N/A 04/24/2016   Procedure: Lower Extremity Angiography;  Surgeon: Lorretta Harp, MD;  Location: [facility];  Service: Cardiovascular;  Laterality: N/A;   PERIPHERAL VASCULAR CATHETERIZATION  04/24/2016   Procedure: Peripheral Vascular Intervention;  Surgeon: Lorretta Harp, MD;  Location: [facility];  Service: Cardiovascular;;  Aorta   retrograde central aortic catheterization  05/19/2005   cutting balloon atherectomy, c-circ stenosis with DES STENTING CYPHER   TONSILLECTOMY     TOTAL KNEE ARTHROPLASTY Left 01/03/2019   Procedure: TOTAL KNEE ARTHROPLASTY;  Surgeon: Netta Cedars, MD;  Location: [facility];  Service: Orthopedics;  Laterality: Left;  with IS block   TRANSURETHRAL RESECTION OF PROSTATE N/A 09/08/2016   Procedure: TRANSURETHRAL RESECTION OF THE PROSTATE (TURP);  Surgeon:  Ardis Hughs, MD;  Location: [facility];  Service: Urology;  Laterality: N/A;   TRANSURETHRAL RESECTION OF PROSTATE     10-10-17  Dr. Louis Meckel   TRANSURETHRAL RESECTION OF PROSTATE N/A 10/10/2017   Procedure: TRANSURETHRAL RESECTION OF THE PROSTATE (TURP);  Surgeon: Ardis Hughs, MD;  Location: [facility];  Service: Urology;  Laterality: N/A;   VASECTOMY        reports that he quit smoking about 53 years ago. His smoking use included cigarettes. He has a 40.00 pack-year smoking history. He has never used smokeless tobacco. He reports that he does not currently use alcohol after a past usage of about 4.0 standard drinks of alcohol per week. He reports that he does not use drugs. Social History   Socioeconomic History   Marital status: Married    Spouse name: [name]   Number of children: 2   Years of education: BA   Highest education level: Not on file  Occupational History   Occupation: Retired  Tobacco Use   Smoking status: Former  Packs/day: 2.00    Years: 20.00    Total pack years: 40.00    Types: Cigarettes    Quit date: 07/31/1968    Years since quitting: 53.8   Smokeless tobacco: Never  Vaping Use   Vaping Use: Never used  Substance and Sexual Activity   Alcohol use: Not Currently    Alcohol/week: 4.0 standard drinks of alcohol    Types: 4 Glasses of wine per week   Drug use: Never   Sexual activity: Not Currently  Other Topics Concern   Not on file  Social History Narrative   Lives with wife   Caffeine use: Decaf coffee, tea   Left handed   Social Determinants of Health   Financial Resource Strain: Not on file  Food Insecurity: Not on file  Transportation Needs: Not on file  Physical Activity: Not on file  Stress: Not on file  Social Connections: Not on file  Intimate Partner Violence: Not on file   Functional Status Survey:    Allergies  Allergen Reactions   Lisinopril Cough   Niaspan [Niacin Er] Rash    Pertinent  Health Maintenance Due  Topic  Date Due   INFLUENZA VACCINE  Completed    Medications: Outpatient Encounter Medications as of 06/01/2022  Medication Sig   acetaminophen (TYLENOL) 500 MG tablet Take 1 tablet (500 mg total) by mouth 2 (two) times daily as needed.   amLODipine (NORVASC) 5 MG tablet Take 2 tablets (10 mg total) by mouth daily.   Apoaequorin (PREVAGEN PO) Take 1 tablet by mouth daily.   aspirin EC 81 MG tablet Take 1 tablet (81 mg total) by mouth 2 (two) times a day.   atenolol (TENORMIN) 50 MG tablet Take 50 mg by mouth daily.   atorvastatin (LIPITOR) 40 MG tablet Take 40 mg by mouth daily at 6 PM.    Biotin 10 MG CAPS Take 10 mg by mouth daily.   cholecalciferol (VITAMIN D3) 25 MCG (1000 UT) tablet Take 1,000 Units by mouth daily.   clopidogrel (PLAVIX) 75 MG tablet Take 75 mg by mouth every evening.    diazepam (VALIUM) 5 MG tablet Take 2.5 mg by mouth every 8 (eight) hours as needed for anxiety.   hydrALAZINE (APRESOLINE) 10 MG tablet Take 10 mg by mouth 3 (three) times daily. As needed only if SBP>180   hydrochlorothiazide (MICROZIDE) 12.5 MG capsule Take 25 mg by mouth daily.   HYDROcodone bit-homatropine (HYCODAN) 5-1.5 MG/5ML syrup Take 5 mLs by mouth every 6 (six) hours as needed for cough.   iron polysaccharides (NIFEREX) 150 MG capsule Take 150 mg by mouth every Monday, Wednesday, and Friday.   losartan (COZAAR) 50 MG tablet Take 50 mg by mouth 2 (two) times daily.   Magnesium 250 MG TABS Take 250 mg by mouth at bedtime.   Misc Natural Products (GLUCOSAMINE CHONDROITIN TRIPLE) TABS Take 1 tablet by mouth 2 (two) times daily.   Multiple Vitamin (MULTIVITAMIN WITH MINERALS) TABS tablet Take 1 tablet by mouth daily.   pantoprazole (PROTONIX) 40 MG tablet TAKE 1 TABLET BY MOUTH TWICE A DAY   potassium chloride SA (K-DUR,KLOR-CON) 20 MEQ tablet Take 20 mEq by mouth every evening.    tamsulosin (FLOMAX) 0.4 MG CAPS capsule Take 1 capsule (0.4 mg total) by mouth daily after supper.   No  facility-administered encounter medications on file as of 06/01/2022.    Review of Systems  Constitutional:  Negative for activity change, appetite change, chills, diaphoresis, fatigue, fever and unexpected  weight change.  Respiratory:  Positive for cough (chronic). Negative for shortness of breath, wheezing and stridor.   Cardiovascular:  Negative for chest pain, palpitations and leg swelling.  Gastrointestinal:  Negative for abdominal distention, abdominal pain, constipation and diarrhea.  Genitourinary:  Negative for difficulty urinating and dysuria.  Musculoskeletal:  Positive for gait problem. Negative for arthralgias, back pain, joint swelling and myalgias.  Neurological:  Positive for weakness (improving). Negative for dizziness, seizures, syncope, facial asymmetry, speech difficulty and headaches.  Hematological:  Negative for adenopathy. Does not bruise/bleed easily.  Psychiatric/Behavioral:  Negative for agitation, behavioral problems and confusion.        Memory loss    Vitals:   06/01/22 1204  BP: (!) 165/65  Pulse: 67  Resp: 20  Temp: 98.4 F (36.9 C)  SpO2: 96%   There is no height or weight on file to calculate BMI. Physical Exam Constitutional:      General: He is not in acute distress.    Appearance: He is not diaphoretic.  HENT:     Head: Normocephalic and atraumatic.  Neck:     Thyroid: No thyromegaly.     Vascular: No JVD.     Trachea: No tracheal deviation.  Cardiovascular:     Rate and Rhythm: Normal rate and regular rhythm.     Heart sounds: No murmur heard. Pulmonary:     Effort: Pulmonary effort is normal. No respiratory distress.     Breath sounds: Normal breath sounds. No wheezing.  Abdominal:     General: Bowel sounds are normal. There is no distension.     Palpations: Abdomen is soft.     Tenderness: There is no abdominal tenderness.  Musculoskeletal:     Right lower leg: No edema.     Left lower leg: No edema.  Lymphadenopathy:      Cervical: No cervical adenopathy.  Skin:    General: Skin is warm and dry.  Neurological:     Mental Status: He is alert and oriented to person, place, and time.     Cranial Nerves: No cranial nerve deficit.     Labs reviewed: Basic Metabolic Panel: Recent Labs    05/13/22 1510 05/13/22 1848 05/13/22 2334 05/13/22 2341 05/14/22 0452 05/15/22 0613 05/22/22 0000  NA 143  --   --    < > 140 140 142  K 4.0  --   --    < > 3.2* 3.7 4.7  CL 108  --   --   --  106 105 106  CO2 28  --   --   --  26 26 28*  GLUCOSE 114*  --   --   --  90 85  --   BUN 33*  --   --   --  24* 21 16  CREATININE 1.43*  --   --   --  1.14 1.05 1.0  CALCIUM 9.3  --   --   --  8.6* 9.1 9.3  MG  --  2.4  --   --  2.2  --   --   PHOS  --   --  3.2  --  2.7  --   --    < > = values in this interval not displayed.   Liver Function Tests: Recent Labs    05/13/22 1510 05/13/22 2334 05/14/22 0452  AST '22 17 19  '$ ALT '21 22 21  '$ ALKPHOS 70 66 64  BILITOT 0.7 0.9 0.8  PROT 6.7  6.3* 6.1*  ALBUMIN 3.5 3.3* 3.2*   No results for input(s): "LIPASE", "AMYLASE" in the last 8760 hours. Recent Labs    05/13/22 2334  AMMONIA 18   CBC: Recent Labs    05/13/22 1510 05/13/22 2341 05/14/22 0452 05/15/22 0105 05/15/22 0613 05/16/22 0110  WBC 9.5  --  7.5 TEST REQUEST RECEIVED WITHOUT APPROPRIATE SPECIMEN 7.3 8.7  NEUTROABS 7.3  --   --   --  5.0 6.4  HGB 12.6*   < > 12.2*  --  13.5 12.6*  HCT 37.7*   < > 39.4  --  40.6 38.4*  MCV 85.1  --  89.5  --  83.2 83.3  PLT 245  --  190  --  195 189   < > = values in this interval not displayed.   Cardiac Enzymes: Recent Labs    05/13/22 2334  CKTOTAL 50   BNP: Invalid input(s): "POCBNP" CBG: No results for input(s): "GLUCAP" in the last 8760 hours.  Procedures and Imaging Studies During Stay: MR LUMBAR SPINE WO CONTRAST  Result Date: 05/14/2022 CLINICAL DATA:  Lumbar radiculopathy EXAM: MRI LUMBAR SPINE WITHOUT CONTRAST TECHNIQUE: Multiplanar,  multisequence MR imaging of the lumbar spine was performed. No intravenous contrast was administered. COMPARISON:  None Available. FINDINGS: Segmentation:  Standard. Alignment:  Physiologic. Vertebrae: Low STIR signal, hyperintense T1/T2-weighted signal within the L1 vertebral body with approximately 25% height loss. Otherwise normal vertebrae. Conus medullaris and cauda equina: Conus extends to the L1 level. Conus and cauda equina appear normal. Paraspinal and other soft tissues: Progression of left renal atrophy Disc levels: T12-L1: Small central disc protrusion without stenosis L1-L2: Small central disc protrusion. No spinal canal stenosis. No neural foraminal stenosis. L2-L3: Small disc bulge. No spinal canal stenosis. No neural foraminal stenosis. L3-L4: Minimal disc bulge. No spinal canal stenosis. No neural foraminal stenosis. L4-L5: Moderate facet hypertrophy and small disc bulge. No spinal canal stenosis. No neural foraminal stenosis. L5-S1: Disc space narrowing with left asymmetric bulge. No spinal canal stenosis. Left neural foraminal stenosis. Visualized sacrum: Normal. IMPRESSION: 1. L1 height loss without bone marrow edema. This may be a non acute compression fracture in the setting of hemangioma. 2. Mild left L5-S1 neural foraminal stenosis. 3. Unchanged moderate L4-5 facet arthrosis, which may serve as a source of local low back pain. Electronically Signed   By: Ulyses Jarred M.D.   On: 05/14/2022 02:21   US RENAL  Result Date: 05/14/2022 CLINICAL DATA:  Acute kidney injury EXAM: RENAL / URINARY TRACT ULTRASOUND COMPLETE COMPARISON:  02/15/2018 FINDINGS: Right Kidney: Renal measurements: 10.9 x 5.2 x 7.2 cm = volume: 212 mL. Normal echotexture. Mild right hydronephrosis. No mass. Left Kidney: Renal measurements: 9.7 x 4.8 x 5.1 cm = volume: 123 mL. 1.4 cm cyst in the midpole. Normal echotexture. No hydronephrosis. Bladder: Appears normal for degree of bladder distention. Other: None. IMPRESSION:  Mild right hydronephrosis. Electronically Signed   By: Rolm Baptise M.D.   On: 05/14/2022 00:40   DG Chest Port 1 View  Result Date: 05/13/2022 CLINICAL DATA:  Weight loss, weakness.  Fall 1 week ago. EXAM: ABDOMEN - 1 VIEW; PORTABLE CHEST - 1 VIEW COMPARISON:  01/25/2018, 05/02/2022. FINDINGS: The heart and mediastinal contour are stable. There is atherosclerotic calcification of the aorta. No consolidation, effusion, or pneumothorax. No acute osseous abnormality. The bowel gas pattern is normal. A vascular stent is present over the lumbar spine. No radio-opaque calculi. Degenerative changes are present in the thoracolumbar spine. IMPRESSION:  1. No cardiopulmonary process. 2. Nonobstructive bowel-gas pattern. Electronically Signed   By: Brett Fairy M.D.   On: 05/13/2022 22:34   DG Abd 1 View  Result Date: 05/13/2022 CLINICAL DATA:  Weight loss, weakness.  Fall 1 week ago. EXAM: ABDOMEN - 1 VIEW; PORTABLE CHEST - 1 VIEW COMPARISON:  01/25/2018, 05/02/2022. FINDINGS: The heart and mediastinal contour are stable. There is atherosclerotic calcification of the aorta. No consolidation, effusion, or pneumothorax. No acute osseous abnormality. The bowel gas pattern is normal. A vascular stent is present over the lumbar spine. No radio-opaque calculi. Degenerative changes are present in the thoracolumbar spine. IMPRESSION: 1. No cardiopulmonary process. 2. Nonobstructive bowel-gas pattern. Electronically Signed   By: Brett Fairy M.D.   On: 05/13/2022 22:34   CT HEAD WO CONTRAST (5MM)  Result Date: 05/02/2022 CLINICAL DATA:  Status post trauma. EXAM: CT HEAD WITHOUT CONTRAST TECHNIQUE: Contiguous axial images were obtained from the base of the skull through the vertex without intravenous contrast. RADIATION DOSE REDUCTION: This exam was performed according to the departmental dose-optimization program which includes automated exposure control, adjustment of the mA and/or kV according to patient size and/or  use of iterative reconstruction technique. COMPARISON:  November 22, 2016 FINDINGS: Brain: There is mild to moderate severity cerebral atrophy with widening of the extra-axial spaces and ventricular dilatation. There are areas of decreased attenuation within the white matter tracts of the supratentorial brain, consistent with microvascular disease changes. Vascular: No hyperdense vessel or unexpected calcification. Skull: Normal. Negative for fracture or focal lesion. Sinuses/Orbits: No acute finding. Other: None. IMPRESSION: 1. No acute intracranial abnormality. 2. Cerebral atrophy and microvascular disease changes of the supratentorial brain. Electronically Signed   By: Virgina Norfolk M.D.   On: 05/02/2022 21:42   DG Chest Portable 1 View  Result Date: 05/02/2022 CLINICAL DATA:  Fall. EXAM: PORTABLE CHEST 1 VIEW COMPARISON:  January 06, 2021. FINDINGS: The heart size and mediastinal contours are within normal limits. Both lungs are clear. The visualized skeletal structures are unremarkable. IMPRESSION: No active disease. Electronically Signed   By: Marijo Conception M.D.   On: 05/02/2022 21:24    Assessment/Plan:    1. Acute urinary retention Resolved Followed by urology On Flomax  2. Weakness of both legs Some improvement with therapy Likely has neuropathy but no pain Needs help with personal care. Recommended home care but his wife wants to try to care for him herself.   3. MCI (mild cognitive impairment) MMSE 25/30  4. PAD (peripheral artery disease) ([facility]) Followed by Dr Gwenlyn Found, on plavix and asa Also has AAA  5. Mixed hyperlipidemia Lab Results  Component Value Date   LDLCALC 44 11/23/2016  Continue lipitor.   6. Essential hypertension Add hydralazine 10 mg BID Clinic nurse to recheck BP Recommend follow up with PCP in 1-2 weeks for BMP since the HCTZ was added bac  7. Iron deficiency anemia, unspecified iron deficiency anemia type Lab Results  Component Value Date   HGB 12.6  (L) 05/16/2022   ON iron EGD and Colonoscopy 02/06/22 polyp removal    Patient is being discharged with the following home health services:  NA  Patient is being discharged with the following durable medical equipment:  NA  Patient has been advised to f/u with their PCP in 1-2 weeks to for a transitions of care visit.  Social services at their facility was responsible for arranging this appointment.  Pt was provided with adequate prescriptions of noncontrolled medications to reach the  scheduled appointment .  For controlled substances, a limited supply was provided as appropriate for the individual patient.  If the pt normally receives these medications from a pain clinic or has a contract with another physician, these medications should be received from that clinic or physician only).    Future labs/tests needed:  BMP

## 2022-06-12 ENCOUNTER — Encounter: Payer: Self-pay | Admitting: Internal Medicine

## 2022-07-09 ENCOUNTER — Other Ambulatory Visit: Payer: Self-pay

## 2022-07-09 ENCOUNTER — Encounter (HOSPITAL_COMMUNITY): Payer: Self-pay | Admitting: Internal Medicine

## 2022-07-09 ENCOUNTER — Emergency Department (HOSPITAL_COMMUNITY): Payer: Medicare Other

## 2022-07-09 ENCOUNTER — Inpatient Hospital Stay (HOSPITAL_COMMUNITY)
Admission: EM | Admit: 2022-07-09 | Discharge: 2022-07-13 | DRG: 522 | Disposition: A | Discharge status: Skilled Nursing Facility | Payer: Medicare Other | Source: Skilled Nursing Facility | Attending: Student | Admitting: Student

## 2022-07-09 DIAGNOSIS — S72011A Unspecified intracapsular fracture of right femur, initial encounter for closed fracture: Principal | ICD-10-CM | POA: Diagnosis present

## 2022-07-09 DIAGNOSIS — E860 Dehydration: Secondary | ICD-10-CM | POA: Diagnosis not present

## 2022-07-09 DIAGNOSIS — Z87891 Personal history of nicotine dependence: Secondary | ICD-10-CM

## 2022-07-09 DIAGNOSIS — Z7902 Long term (current) use of antithrombotics/antiplatelets: Secondary | ICD-10-CM

## 2022-07-09 DIAGNOSIS — Z9849 Cataract extraction status, unspecified eye: Secondary | ICD-10-CM | POA: Diagnosis not present

## 2022-07-09 DIAGNOSIS — Z79899 Other long term (current) drug therapy: Secondary | ICD-10-CM | POA: Diagnosis not present

## 2022-07-09 DIAGNOSIS — K219 Gastro-esophageal reflux disease without esophagitis: Secondary | ICD-10-CM | POA: Diagnosis not present

## 2022-07-09 DIAGNOSIS — I350 Nonrheumatic aortic (valve) stenosis: Secondary | ICD-10-CM | POA: Diagnosis present

## 2022-07-09 DIAGNOSIS — N179 Acute kidney failure, unspecified: Secondary | ICD-10-CM | POA: Diagnosis not present

## 2022-07-09 DIAGNOSIS — D62 Acute posthemorrhagic anemia: Secondary | ICD-10-CM | POA: Insufficient documentation

## 2022-07-09 DIAGNOSIS — S72141A Displaced intertrochanteric fracture of right femur, initial encounter for closed fracture: Secondary | ICD-10-CM | POA: Diagnosis present

## 2022-07-09 DIAGNOSIS — Z8711 Personal history of peptic ulcer disease: Secondary | ICD-10-CM

## 2022-07-09 DIAGNOSIS — S72144A Nondisplaced intertrochanteric fracture of right femur, initial encounter for closed fracture: Secondary | ICD-10-CM | POA: Diagnosis not present

## 2022-07-09 DIAGNOSIS — E785 Hyperlipidemia, unspecified: Secondary | ICD-10-CM | POA: Diagnosis present

## 2022-07-09 DIAGNOSIS — I739 Peripheral vascular disease, unspecified: Secondary | ICD-10-CM | POA: Diagnosis present

## 2022-07-09 DIAGNOSIS — Z86010 Personal history of colonic polyps: Secondary | ICD-10-CM | POA: Diagnosis not present

## 2022-07-09 DIAGNOSIS — D509 Iron deficiency anemia, unspecified: Secondary | ICD-10-CM | POA: Diagnosis present

## 2022-07-09 DIAGNOSIS — Z8679 Personal history of other diseases of the circulatory system: Secondary | ICD-10-CM

## 2022-07-09 DIAGNOSIS — I10 Essential (primary) hypertension: Secondary | ICD-10-CM | POA: Diagnosis not present

## 2022-07-09 DIAGNOSIS — W07XXXA Fall from chair, initial encounter: Secondary | ICD-10-CM | POA: Diagnosis present

## 2022-07-09 DIAGNOSIS — N400 Enlarged prostate without lower urinary tract symptoms: Secondary | ICD-10-CM | POA: Diagnosis present

## 2022-07-09 DIAGNOSIS — R410 Disorientation, unspecified: Secondary | ICD-10-CM | POA: Insufficient documentation

## 2022-07-09 DIAGNOSIS — Z9079 Acquired absence of other genital organ(s): Secondary | ICD-10-CM

## 2022-07-09 DIAGNOSIS — Z87442 Personal history of urinary calculi: Secondary | ICD-10-CM

## 2022-07-09 DIAGNOSIS — I2510 Atherosclerotic heart disease of native coronary artery without angina pectoris: Secondary | ICD-10-CM | POA: Diagnosis present

## 2022-07-09 DIAGNOSIS — Z7982 Long term (current) use of aspirin: Secondary | ICD-10-CM

## 2022-07-09 DIAGNOSIS — W19XXXA Unspecified fall, initial encounter: Secondary | ICD-10-CM | POA: Diagnosis not present

## 2022-07-09 DIAGNOSIS — Z95820 Peripheral vascular angioplasty status with implants and grafts: Secondary | ICD-10-CM

## 2022-07-09 DIAGNOSIS — Z8249 Family history of ischemic heart disease and other diseases of the circulatory system: Secondary | ICD-10-CM

## 2022-07-09 DIAGNOSIS — Z8719 Personal history of other diseases of the digestive system: Secondary | ICD-10-CM

## 2022-07-09 DIAGNOSIS — Z888 Allergy status to other drugs, medicaments and biological substances status: Secondary | ICD-10-CM | POA: Diagnosis not present

## 2022-07-09 DIAGNOSIS — S72001A Fracture of unspecified part of neck of right femur, initial encounter for closed fracture: Secondary | ICD-10-CM | POA: Diagnosis not present

## 2022-07-09 DIAGNOSIS — Z96652 Presence of left artificial knee joint: Secondary | ICD-10-CM | POA: Diagnosis present

## 2022-07-09 DIAGNOSIS — Z01818 Encounter for other preprocedural examination: Secondary | ICD-10-CM | POA: Diagnosis not present

## 2022-07-09 DIAGNOSIS — Z8041 Family history of malignant neoplasm of ovary: Secondary | ICD-10-CM

## 2022-07-09 DIAGNOSIS — Z955 Presence of coronary angioplasty implant and graft: Secondary | ICD-10-CM

## 2022-07-09 DIAGNOSIS — Y92009 Unspecified place in unspecified non-institutional (private) residence as the place of occurrence of the external cause: Secondary | ICD-10-CM

## 2022-07-09 DIAGNOSIS — Z808 Family history of malignant neoplasm of other organs or systems: Secondary | ICD-10-CM

## 2022-07-09 LAB — BASIC METABOLIC PANEL
Anion gap: 8 (ref 5–15)
BUN: 15 mg/dL (ref 8–23)
CO2: 25 mmol/L (ref 22–32)
Calcium: 9 mg/dL (ref 8.9–10.3)
Chloride: 108 mmol/L (ref 98–111)
Creatinine, Ser: 0.96 mg/dL (ref 0.61–1.24)
GFR, Estimated: >60 mL/min (ref >60)
Glucose, Bld: 129 mg/dL — ABNORMAL HIGH (ref 70–99)
Potassium: 3.8 mmol/L (ref 3.5–5.1)
Sodium: 141 mmol/L (ref 135–145)

## 2022-07-09 LAB — CBC WITH DIFFERENTIAL/PLATELET
Abs Immature Granulocytes: 0.08 10*3/uL — ABNORMAL HIGH (ref 0.00–0.07)
Basophils Absolute: 0 10*3/uL (ref 0.0–0.1)
Basophils Relative: 0 %
Eosinophils Absolute: 0.2 10*3/uL (ref 0.0–0.5)
Eosinophils Relative: 2 %
HCT: 38.5 % — ABNORMAL LOW (ref 39.0–52.0)
Hemoglobin: 12.6 g/dL — ABNORMAL LOW (ref 13.0–17.0)
Immature Granulocytes: 1 %
Lymphocytes Relative: 10 %
Lymphs Abs: 0.9 10*3/uL (ref 0.7–4.0)
MCH: 28.5 pg (ref 26.0–34.0)
MCHC: 32.7 g/dL (ref 30.0–36.0)
MCV: 87.1 fL (ref 80.0–100.0)
Monocytes Absolute: 0.5 10*3/uL (ref 0.1–1.0)
Monocytes Relative: 6 %
Neutro Abs: 7.3 10*3/uL (ref 1.7–7.7)
Neutrophils Relative %: 81 %
Platelets: 257 10*3/uL (ref 150–400)
RBC: 4.42 MIL/uL (ref 4.22–5.81)
RDW: 13.1 % (ref 11.5–15.5)
WBC: 9 10*3/uL (ref 4.0–10.5)
nRBC: 0 % (ref 0.0–0.2)

## 2022-07-09 LAB — TYPE AND SCREEN
ABO/RH(D): O POS
Antibody Screen: NEGATIVE

## 2022-07-09 LAB — PROTIME-INR
INR: 0.9 (ref 0.8–1.2)
Prothrombin Time: 12.5 s (ref 11.4–15.2)

## 2022-07-09 MED ORDER — FENTANYL CITRATE PF 50 MCG/ML IJ SOSY
50.0000 ug | PREFILLED_SYRINGE | Freq: Once | INTRAMUSCULAR | Status: AC
  Administered 2022-07-09: 50 ug via INTRAVENOUS
  Filled 2022-07-09: qty 1

## 2022-07-09 MED ORDER — HEPARIN SODIUM (PORCINE) 5000 UNIT/ML IJ SOLN
5000.0000 [IU] | Freq: Three times a day (TID) | INTRAMUSCULAR | Status: DC
  Administered 2022-07-09 – 2022-07-10 (×4): 5000 [IU] via SUBCUTANEOUS
  Filled 2022-07-09 (×4): qty 1

## 2022-07-09 MED ORDER — GABAPENTIN 100 MG PO CAPS
100.0000 mg | ORAL_CAPSULE | Freq: Three times a day (TID) | ORAL | Status: DC
  Administered 2022-07-09 – 2022-07-13 (×10): 100 mg via ORAL
  Filled 2022-07-09 (×11): qty 1

## 2022-07-09 MED ORDER — POLYSACCHARIDE IRON COMPLEX 150 MG PO CAPS
150.0000 mg | ORAL_CAPSULE | ORAL | Status: DC
  Administered 2022-07-10 – 2022-07-12 (×2): 150 mg via ORAL
  Filled 2022-07-09 (×2): qty 1

## 2022-07-09 MED ORDER — ATENOLOL 50 MG PO TABS
50.0000 mg | ORAL_TABLET | Freq: Every day | ORAL | Status: DC
  Administered 2022-07-10 – 2022-07-13 (×4): 50 mg via ORAL
  Filled 2022-07-09 (×4): qty 1

## 2022-07-09 MED ORDER — ATORVASTATIN CALCIUM 40 MG PO TABS
40.0000 mg | ORAL_TABLET | Freq: Every day | ORAL | Status: DC
  Administered 2022-07-10 – 2022-07-12 (×3): 40 mg via ORAL
  Filled 2022-07-09 (×3): qty 1

## 2022-07-09 MED ORDER — ACETAMINOPHEN 325 MG PO TABS
650.0000 mg | ORAL_TABLET | Freq: Four times a day (QID) | ORAL | Status: DC
  Administered 2022-07-09 – 2022-07-11 (×6): 650 mg via ORAL
  Filled 2022-07-09 (×6): qty 2

## 2022-07-09 MED ORDER — MORPHINE SULFATE (PF) 2 MG/ML IV SOLN
0.5000 mg | INTRAVENOUS | Status: DC | PRN
  Administered 2022-07-09 – 2022-07-10 (×5): 0.5 mg via INTRAVENOUS
  Filled 2022-07-09 (×5): qty 1

## 2022-07-09 MED ORDER — SODIUM CHLORIDE 0.45 % IV SOLN: INTRAVENOUS | Status: DC

## 2022-07-09 MED ORDER — FENTANYL CITRATE PF 50 MCG/ML IJ SOSY
50.0000 ug | PREFILLED_SYRINGE | INTRAMUSCULAR | Status: AC | PRN
  Administered 2022-07-09 (×2): 50 ug via INTRAVENOUS
  Filled 2022-07-09 (×2): qty 1

## 2022-07-09 MED ORDER — TAMSULOSIN HCL 0.4 MG PO CAPS
0.4000 mg | ORAL_CAPSULE | Freq: Every day | ORAL | Status: DC
  Administered 2022-07-09 – 2022-07-12 (×4): 0.4 mg via ORAL
  Filled 2022-07-09 (×4): qty 1

## 2022-07-09 MED ORDER — KETOROLAC TROMETHAMINE 30 MG/ML IJ SOLN: 30.0000 mg | Freq: Four times a day (QID) | INTRAMUSCULAR | Status: DC | PRN

## 2022-07-09 MED ORDER — ZOLPIDEM TARTRATE 5 MG PO TABS: 5.0000 mg | ORAL_TABLET | Freq: Every evening | ORAL | Status: DC | PRN

## 2022-07-09 MED ORDER — POTASSIUM CHLORIDE CRYS ER 20 MEQ PO TBCR
20.0000 meq | EXTENDED_RELEASE_TABLET | Freq: Every evening | ORAL | Status: DC
  Administered 2022-07-09 – 2022-07-12 (×4): 20 meq via ORAL
  Filled 2022-07-09 (×4): qty 1

## 2022-07-09 MED ORDER — HYDROCHLOROTHIAZIDE 12.5 MG PO TABS
25.0000 mg | ORAL_TABLET | Freq: Every day | ORAL | Status: DC
  Administered 2022-07-10: 25 mg via ORAL
  Filled 2022-07-09: qty 2

## 2022-07-09 MED ORDER — FINASTERIDE 5 MG PO TABS
5.0000 mg | ORAL_TABLET | Freq: Every day | ORAL | Status: DC
  Administered 2022-07-10 – 2022-07-13 (×4): 5 mg via ORAL
  Filled 2022-07-09 (×4): qty 1

## 2022-07-09 MED ORDER — ASPIRIN 81 MG PO TBEC
81.0000 mg | DELAYED_RELEASE_TABLET | Freq: Every day | ORAL | Status: DC
  Administered 2022-07-10: 81 mg via ORAL
  Filled 2022-07-09 (×2): qty 1

## 2022-07-09 MED ORDER — HYDRALAZINE HCL 10 MG PO TABS
10.0000 mg | ORAL_TABLET | ORAL | Status: DC
  Administered 2022-07-09: 10 mg via ORAL
  Filled 2022-07-09 (×2): qty 1

## 2022-07-09 MED ORDER — DIAZEPAM 5 MG PO TABS: 2.5000 mg | ORAL_TABLET | Freq: Three times a day (TID) | ORAL | Status: DC | PRN

## 2022-07-09 MED ORDER — SENNA 8.6 MG PO TABS
1.0000 | ORAL_TABLET | Freq: Two times a day (BID) | ORAL | Status: DC
  Administered 2022-07-09 – 2022-07-13 (×8): 8.6 mg via ORAL
  Filled 2022-07-09 (×8): qty 1

## 2022-07-09 MED ORDER — PANTOPRAZOLE SODIUM 40 MG PO TBEC
40.0000 mg | DELAYED_RELEASE_TABLET | Freq: Every day | ORAL | Status: DC
  Administered 2022-07-09 – 2022-07-12 (×4): 40 mg via ORAL
  Filled 2022-07-09 (×4): qty 1

## 2022-07-09 MED ORDER — LOSARTAN POTASSIUM 50 MG PO TABS
50.0000 mg | ORAL_TABLET | Freq: Every day | ORAL | Status: DC
  Administered 2022-07-10 – 2022-07-13 (×4): 50 mg via ORAL
  Filled 2022-07-09: qty 1
  Filled 2022-07-09: qty 2
  Filled 2022-07-09: qty 1
  Filled 2022-07-09: qty 2

## 2022-07-09 MED ORDER — AMLODIPINE BESYLATE 10 MG PO TABS
10.0000 mg | ORAL_TABLET | Freq: Every day | ORAL | Status: DC
  Administered 2022-07-10 – 2022-07-13 (×4): 10 mg via ORAL
  Filled 2022-07-09: qty 1
  Filled 2022-07-09 (×2): qty 2
  Filled 2022-07-09: qty 1

## 2022-07-09 MED ORDER — IBUPROFEN 200 MG PO TABS
400.0000 mg | ORAL_TABLET | Freq: Four times a day (QID) | ORAL | Status: DC
  Administered 2022-07-09 – 2022-07-10 (×3): 400 mg via ORAL
  Filled 2022-07-09 (×3): qty 2

## 2022-07-09 NOTE — Assessment & Plan Note (Signed)
Sees Dr. Adora Fridge. He has been stable. EKG nl.  Plan Continue all cardiac meds except plavix  Cleared for surgery

## 2022-07-09 NOTE — Assessment & Plan Note (Signed)
BP stable.  Plan Continue home regimen

## 2022-07-09 NOTE — Assessment & Plan Note (Signed)
Continue PPI ?

## 2022-07-09 NOTE — ED Notes (Signed)
ED TO INPATIENT HANDOFF REPORT  ED Nurse Name and Phone #: Alroy Bailiff Name/Age/Gender Nathan Santiago 88 y.o. male Room/Bed: WA13/WA13  Code Status   Code Status: Full Code  Home/SNF/Other Home Patient oriented to: self, place, time, and situation Is this baseline? Yes   Triage Complete: Triage complete  Chief Complaint Intertrochanteric fracture of right femur [facility]) [M62.947M]  Triage Note Pt BIBA from [facility]. Pt had mech fall while trying to pull chairs apart. Landed on R hip, some deform noted. No head trauma. Pt on Plavix.  Aox4  BP: 246/98, down to 191/81 on arrival HR: 90   Allergies Allergies  Allergen Reactions   Lisinopril Cough   Niaspan [Niacin Er] Rash    Level of Care/Admitting Diagnosis ED Disposition     ED Disposition  Admit   Condition  --   [place]: [facility] [[id number]]  Level of Care: Med-Surg [16]  May admit patient to [facility] or [facility] if equivalent level of care is available:: Yes  Covid Evaluation: Confirmed COVID Negative  Diagnosis: Intertrochanteric fracture of right femur [facility]) [[id number]]  Admitting Physician: Neena Rhymes [[id number]]  Attending Physician: Neena Rhymes [[id number]]  Certification:: I certify this patient will need inpatient services for at least 2 midnights  Estimated Length of Stay: 4          B Medical/Surgery History Past Medical History:  Diagnosis Date   AAA (abdominal aortic aneurysm) ([place])    asymptomatic   AAA (abdominal aortic aneurysm) ([place]) 2010   peripheral  angiogram-- bilateral SFA DISEASE  and 60 to 70% infrarenaal abd. aortic stenosis with 15 -mm gradient   Adenomatous colon polyp 11/2003   CAD (coronary artery disease)    Gait abnormality 02/13/2017   Gastroparesis    pt denies   GERD (gastroesophageal reflux disease)    w/ LPR   History of kidney stones    x2   Hyperlipidemia    Hypertension    IDA (iron deficiency anemia)     Peripheral arterial disease ([place])    RCEA  by Dr Lemar Livings   PUD (peptic ulcer disease)    pt unaware   Vertigo    Past Surgical History:  Procedure Laterality Date   ABDOMINAL AORTOGRAM W/LOWER EXTREMITY Bilateral 08/05/2018   Procedure: ABDOMINAL AORTOGRAM W/LOWER EXTREMITY;  Surgeon: Lorretta Harp, MD;  Location: [place] [facility];  Service: Cardiovascular;  Laterality: Bilateral;   ABDOMINAL AORTOGRAM W/LOWER EXTREMITY N/A 09/26/2021   Procedure: ABDOMINAL AORTOGRAM W/LOWER EXTREMITY;  Surgeon: Lorretta Harp, MD;  Location: [place] [facility];  Service: Cardiovascular;  Laterality: N/A;   AORTOGRAM  04/24/2016    Abdominal aortogram, bilateral iliac angiogram, bifemoral runoff   CARDIAC CATHETERIZATION  05/12/2005   RCA   carotid doppler  01/24/2013   RICA endarterectomy,left CCA 0-49%; left bulb and prox ICA 50-69%; bilaateral subclavian < 50%   CAROTID ENDARTERECTOMY  11/01/2010   CATARACT EXTRACTION     COLONOSCOPY     CORONARY ANGIOPLASTY  05/18/2005   2 STENTS distal RCA AND PROXIMAL-MID RCA   5 total stents per pt.   CYSTOSCOPY/URETEROSCOPY/HOLMIUM LASER/STENT PLACEMENT Left 01/11/2018   Procedure: LEFT URETEROSCOPY/HOLMIUM LASER/STENT PLACEMENT;  Surgeon: Ardis Hughs, MD;  Location[facility];  Service: Urology;  Laterality: Left;   DOPPLER ECHOCARDIOGRAPHY  02/07/2012   EF 55%,SHOWED NO ISCHEMIA    HERNIA REPAIR     umbilical   lower arterial  doppler  02/04/2013   aotra 1.5 x 1.5 cm; distal abd aorta 70-99%,proximal common iliac arteries -very stenotic with increased velocities>50%,may be falsely elevated as a result of residual plaque from the distal aorta stenosis   lower extremity doppler  June 18 ,2013   ABI'S ABNORMAL, RABI was 0.88 and LABI 0.75 ,with 3-vessel  run off   NM MYOVIEW LTD  MAY 23,2011   showed no significant ischemia;   NM MYOVIEW LTD  04/22/2008   ef 77%,exercise capcity 6 METS ,exaggerated blood pressure response to exercise    PERIPHERAL VASCULAR CATHETERIZATION N/A 04/24/2016   Procedure: Lower Extremity Angiography;  Surgeon: Lorretta Harp, MD;  Location: [place] [facility];  Service: Cardiovascular;  Laterality: N/A;   PERIPHERAL VASCULAR CATHETERIZATION  04/24/2016   Procedure: Peripheral Vascular Intervention;  Surgeon: Lorretta Harp, MD;  Location: [place] [facility];  Service: Cardiovascular;;  Aorta   retrograde central aortic catheterization  05/19/2005   cutting balloon atherectomy, c-circ stenosis with DES STENTING CYPHER   TONSILLECTOMY     TOTAL KNEE ARTHROPLASTY Left 01/03/2019   Procedure: TOTAL KNEE ARTHROPLASTY;  Surgeon: Netta Cedars, MD;  [facility];  Service: Orthopedics;  Laterality: Left;  with IS block   TRANSURETHRAL RESECTION OF PROSTATE N/A 09/08/2016   Procedure: TRANSURETHRAL RESECTION OF THE PROSTATE (TURP);  Surgeon: Ardis Hughs, MD;  [facility];  Service: Urology;  Laterality: N/A;   TRANSURETHRAL RESECTION OF PROSTATE     10-10-17  Dr. Louis Meckel   TRANSURETHRAL RESECTION OF PROSTATE N/A 10/10/2017   Procedure: TRANSURETHRAL RESECTION OF THE PROSTATE (TURP);  Surgeon: Ardis Hughs, MD;  [facility];  Service: Urology;  Laterality: N/A;   VASECTOMY       A IV Location/Drains/Wounds Patient Lines/Drains/Airways Status     Active Line/Drains/Airways     Name Placement date Placement time Site Days   Peripheral IV 07/09/22 20 G Anterior;Distal;Left;Upper Arm 07/09/22  1440  Arm  less than 1   External Urinary Catheter 07/09/22  1857  --  less than 1   Incision (Closed) 01/03/19 Knee Left 01/03/19  0823  -- 1283            Intake/Output Last 24 hours No intake or output data in the 24 hours ending 07/09/22 1953  Labs/Imaging Results for orders placed or performed during the hospital encounter of 07/09/22 (from the past 48 hour(s))  Basic metabolic panel     Status: Abnormal   Collection Time: 07/09/22  2:50 PM  Result Value Ref Range   Sodium  141 135 - 145 mmol/L   Potassium 3.8 3.5 - 5.1 mmol/L   Chloride 108 98 - 111 mmol/L   CO2 25 22 - 32 mmol/L   Glucose, Bld 129 (H) 70 - 99 mg/dL    Comment: Glucose reference range applies only to samples taken after fasting for at least 8 hours.   BUN 15 8 - 23 mg/dL   Creatinine, Ser 0.96 0.61 - 1.24 mg/dL   Calcium 9.0 8.9 - 10.3 mg/dL   GFR, Estimated >60 >60 mL/min    Comment: (NOTE) Calculated using the CKD-EPI Creatinine Equation (2021)    Anion gap 8 5 - 15    Comment: Performed at [facility], [place] [address] [place],  [address]  CBC with Differential     Status: Abnormal   Collection Time: 07/09/22  2:50 PM  Result Value Ref Range   WBC 9.0 4.0 - 10.5  K/uL   RBC 4.42 4.22 - 5.81 MIL/uL   Hemoglobin 12.6 (L) 13.0 - 17.0 g/dL   HCT 38.5 (L) 39.0 - 52.0 %   MCV 87.1 80.0 - 100.0 fL   MCH 28.5 26.0 - 34.0 pg   MCHC 32.7 30.0 - 36.0 g/dL   RDW 13.1 11.5 - 15.5 %   Platelets 257 150 - 400 K/uL   nRBC 0.0 0.0 - 0.2 %   Neutrophils Relative % 81 %   Neutro Abs 7.3 1.7 - 7.7 K/uL   Lymphocytes Relative 10 %   Lymphs Abs 0.9 0.7 - 4.0 K/uL   Monocytes Relative 6 %   Monocytes Absolute 0.5 0.1 - 1.0 K/uL   Eosinophils Relative 2 %   Eosinophils Absolute 0.2 0.0 - 0.5 K/uL   Basophils Relative 0 %   Basophils Absolute 0.0 0.0 - 0.1 K/uL   Immature Granulocytes 1 %   Abs Immature Granulocytes 0.08 (H) 0.00 - 0.07 K/uL    Comment: Performed at [facility], [place] [address] [place], [place] [address]  Protime-INR     Status: None   Collection Time: 07/09/22  2:50 PM  Result Value Ref Range   Prothrombin Time 12.5 11.4 - 15.2 seconds   INR 0.9 0.8 - 1.2    Comment: (NOTE) INR goal varies based on device and disease states. Performed at [facility], [place] [address] [place], [place] [address]   Type and screen [place]     Status: None   Collection Time: 07/09/22  2:50 PM  Result  Value Ref Range   ABO/RH(D) O POS    Antibody Screen NEG    Sample Expiration      07/12/2022,2359 Performed at [facility], [place] [address], [place], [place] [address]    CT HEAD WO CONTRAST  Result Date: 07/09/2022 CLINICAL DATA:  [place] EXAM: CT HEAD WITHOUT CONTRAST TECHNIQUE: Contiguous axial images were obtained from the base of the skull through the vertex without intravenous contrast. RADIATION DOSE REDUCTION: This exam was performed according to the departmental dose-optimization program which includes automated exposure control, adjustment of the mA and/or kV according to patient size and/or use of iterative reconstruction technique. COMPARISON:  05/02/2022 FINDINGS: Brain: Stable confluent hypodensities throughout the periventricular white matter consistent with chronic small vessel ischemic changes. Chronic lacunar infarcts are again seen within the basal ganglia. No signs of acute infarct or hemorrhage. Lateral ventricles and remaining midline structures are unremarkable. No acute extra-axial fluid collections. No mass effect. Vascular: No hyperdense vessel or unexpected calcification. Skull: Small right parietal scalp hematoma. No underlying fracture. The remainder of the calvarium is unremarkable. Sinuses/Orbits: No acute finding. Other: None. IMPRESSION: 1. Small right parietal scalp hematoma.  No underlying fracture. 2. No acute intracranial process. Electronically Signed   By: Randa Ngo M.D.   On: 07/09/2022 15:30   DG Hip Unilat  With Pelvis 2-3 Views Right  Result Date: 07/09/2022 CLINICAL DATA:  Right hip pain after fall. EXAM: DG HIP (WITH OR WITHOUT PELVIS) 2-3V RIGHT COMPARISON:  None Available. FINDINGS: Signs of impacted subcapital femoral neck fracture identified involving the proximal right femur. Left hip appears intact. Mild bilateral hip osteoarthritis. Atherosclerotic calcifications identified. IMPRESSION: Impacted subcapital femoral neck fracture  involving the proximal right femur. Electronically Signed   By: Kerby Moors M.D.   On: 07/09/2022 15:26   DG Chest 1 View  Result Date: 07/09/2022 CLINICAL DATA:  Status post mechanical  fall with right hip pain. For preoperative evaluation. EXAM: CHEST  1 VIEW COMPARISON:  Chest radiograph dated 05/13/2022 FINDINGS: Normal lung volumes. No focal consolidations. Bibasilar linear opacities. No pleural effusion or pneumothorax. Similar cardiomediastinal silhouette. The visualized skeletal structures are unremarkable. IMPRESSION: 1. No acute cardiopulmonary process. 2. Bibasilar atelectasis. Electronically Signed   By: Darrin Nipper M.D.   On: 07/09/2022 15:23    Pending Labs Unresulted Labs (From admission, onward)    None       Vitals/Pain Today's Vitals   07/09/22 1845 07/09/22 1849 07/09/22 1900 07/09/22 1917  BP: (!) 203/76  (!) 183/71   Pulse: 89  81   Resp:   15   Temp:  98.4 F (36.9 C)    TempSrc:  Oral    SpO2: 97%  97%   Weight:      Height:      PainSc:    6     Isolation Precautions No active isolations  Medications Medications  aspirin EC tablet 81 mg (has no administration in time range)  amLODipine (NORVASC) tablet 10 mg (has no administration in time range)  atenolol (TENORMIN) tablet 50 mg (has no administration in time range)  atorvastatin (LIPITOR) tablet 40 mg (has no administration in time range)  hydrALAZINE (APRESOLINE) tablet 10 mg (has no administration in time range)  hydrochlorothiazide (MICROZIDE) capsule 25 mg (has no administration in time range)  losartan (COZAAR) tablet 50 mg (has no administration in time range)  diazepam (VALIUM) tablet 2.5 mg (has no administration in time range)  pantoprazole (PROTONIX) EC tablet 40 mg (has no administration in time range)  finasteride (PROSCAR) tablet 5 mg (has no administration in time range)  tamsulosin (FLOMAX) capsule 0.4 mg (has no administration in time range)  iron polysaccharides (NIFEREX) capsule  150 mg (has no administration in time range)  gabapentin (NEURONTIN) capsule 100 mg (has no administration in time range)  potassium chloride SA (KLOR-CON M) CR tablet 20 mEq (has no administration in time range)  morphine (PF) 2 MG/ML injection 0.5 mg (has no administration in time range)  heparin injection 5,000 Units (has no administration in time range)  0.45 % sodium chloride infusion (has no administration in time range)  ibuprofen (ADVIL) tablet 400 mg (has no administration in time range)  acetaminophen (TYLENOL) tablet 650 mg (has no administration in time range)  zolpidem (AMBIEN) tablet 5 mg (has no administration in time range)  senna (SENOKOT) tablet 8.6 mg (has no administration in time range)  fentaNYL (SUBLIMAZE) injection 50 mcg (50 mcg Intravenous Given 07/09/22 1608)  fentaNYL (SUBLIMAZE) injection 50 mcg (50 mcg Intravenous Given 07/09/22 1827)    Mobility walks Low fall risk   Focused Assessments    R Recommendations: See Admitting Provider Note  Report given to:   Additional Notes:

## 2022-07-09 NOTE — ED Triage Notes (Signed)
Pt BIBA from [facility]. Pt had mech fall while trying to pull chairs apart. Landed on R hip, some deform noted. No head trauma. Pt on Plavix.  Aox4  BP: 246/98, down to 191/81 on arrival HR: 90

## 2022-07-09 NOTE — Subjective & Objective (Signed)
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 He presents to [facility] for further evaluation.

## 2022-07-09 NOTE — H&P (Signed)
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 He presents to [facility] for further evaluation.   ED Course: afebrile, BP 183/71 (patient in pain) HR 81  RR 15. Lab - glucose 129, CBCD nl, Cmet nl. CXR- NAD, EKG NSR, no acute changes. Hip film right - impacted subcapital femoral neck fracture. Dr. Alvan Dame consulted for orthopedics. [facility] called to admit patient for medical and pain mgt.   Review of Systems:  Review of Systems  Constitutional: Negative.   HENT: Negative.    Eyes: Negative.   Respiratory: Negative.    Cardiovascular: Negative.   Gastrointestinal: Negative.   Genitourinary: Negative.   Musculoskeletal:  Positive for joint pain.       Right hip pain  Skin: Negative.   Neurological: Negative.   Endo/Heme/Allergies: Negative.   Psychiatric/Behavioral: Negative.      Past Medical History:  Diagnosis Date   AAA (abdominal aortic aneurysm) ([place])    asymptomatic   AAA (abdominal aortic aneurysm) ([place]) 2010   peripheral  angiogram-- bilateral SFA DISEASE  and 60 to 70% infrarenaal abd. aortic stenosis with 15 -mm gradient   Adenomatous colon polyp 11/2003   CAD (coronary artery disease)    Gait abnormality 02/13/2017   Gastroparesis    pt denies   GERD (gastroesophageal reflux disease)    w/ LPR   History of kidney stones    x2   Hyperlipidemia    Hypertension    IDA (iron deficiency anemia)    Peripheral arterial disease ([place])    RCEA  by Dr Lemar Livings   PUD (peptic ulcer disease)    pt unaware   Vertigo     Past Surgical History:  Procedure Laterality Date    ABDOMINAL AORTOGRAM W/LOWER EXTREMITY Bilateral 08/05/2018   Procedure: ABDOMINAL AORTOGRAM W/LOWER EXTREMITY;  Surgeon: Lorretta Harp, MD;  Location: [place] [facility];  Service: Cardiovascular;  Laterality: Bilateral;   ABDOMINAL AORTOGRAM W/LOWER EXTREMITY N/A 09/26/2021   Procedure: ABDOMINAL AORTOGRAM W/LOWER EXTREMITY;  Surgeon: Lorretta Harp, MD;  Location: [place] [facility];  Service: Cardiovascular;  Laterality: N/A;   AORTOGRAM  04/24/2016    Abdominal aortogram, bilateral iliac angiogram, bifemoral runoff   CARDIAC CATHETERIZATION  05/12/2005   RCA   carotid doppler  01/24/2013   RICA endarterectomy,left CCA 0-49%; left bulb and prox ICA 50-69%; bilaateral subclavian < 50%   CAROTID ENDARTERECTOMY  11/01/2010   CATARACT EXTRACTION     COLONOSCOPY     CORONARY ANGIOPLASTY  05/18/2005   2 STENTS distal RCA AND PROXIMAL-MID RCA   5 total stents per pt.   CYSTOSCOPY/URETEROSCOPY/HOLMIUM LASER/STENT PLACEMENT Left 01/11/2018   Procedure: LEFT URETEROSCOPY/HOLMIUM LASER/STENT PLACEMENT;  Surgeon: Ardis Hughs, MD;  Location[facility];  Service: Urology;  Laterality: Left;   DOPPLER ECHOCARDIOGRAPHY  02/07/2012   EF 55%,SHOWED NO ISCHEMIA    HERNIA REPAIR     umbilical   lower arterial  doppler  02/04/2013   aotra 1.5 x 1.5 cm; distal abd aorta 70-99%,proximal common iliac arteries -very stenotic with increased velocities>50%,may be falsely elevated as a  result of residual plaque from the distal aorta stenosis   lower extremity doppler  June 18 ,2013   ABI'S ABNORMAL, RABI was 0.88 and LABI 0.75 ,with 3-vessel  run off   NM MYOVIEW LTD  MAY 23,2011   showed no significant ischemia;   NM MYOVIEW LTD  04/22/2008   ef 77%,exercise capcity 6 METS ,exaggerated blood pressure response to exercise   PERIPHERAL VASCULAR CATHETERIZATION N/A 04/24/2016   Procedure: Lower Extremity Angiography;  Surgeon: Lorretta Harp, MD;  Location: [place] [facility];  Service: Cardiovascular;   Laterality: N/A;   PERIPHERAL VASCULAR CATHETERIZATION  04/24/2016   Procedure: Peripheral Vascular Intervention;  Surgeon: Lorretta Harp, MD;  Location: [place] [facility];  Service: Cardiovascular;;  Aorta   retrograde central aortic catheterization  05/19/2005   cutting balloon atherectomy, c-circ stenosis with DES STENTING CYPHER   TONSILLECTOMY     TOTAL KNEE ARTHROPLASTY Left 01/03/2019   Procedure: TOTAL KNEE ARTHROPLASTY;  Surgeon: Netta Cedars, MD;  Location: [facility];  Service: Orthopedics;  Laterality: Left;  with IS block   TRANSURETHRAL RESECTION OF PROSTATE N/A 09/08/2016   Procedure: TRANSURETHRAL RESECTION OF THE PROSTATE (TURP);  Surgeon: Ardis Hughs, MD;  Location: [facility];  Service: Urology;  Laterality: N/A;   TRANSURETHRAL RESECTION OF PROSTATE     10-10-17  Dr. Louis Meckel   TRANSURETHRAL RESECTION OF PROSTATE N/A 10/10/2017   Procedure: TRANSURETHRAL RESECTION OF THE PROSTATE (TURP);  Surgeon: Ardis Hughs, MD;  [facility];  Service: Urology;  Laterality: N/A;   VASECTOMY      Soc Hx - married. Has a son. LIves with wife at [employer].   reports that he quit smoking about 53 years ago. His smoking use included cigarettes. He has a 40.00 pack-year smoking history. He has never used smokeless tobacco. He reports that he does not currently use alcohol after a past usage of about 4.0 standard drinks of alcohol per week. He reports that he does not use drugs.  Allergies  Allergen Reactions   Lisinopril Cough   Niaspan [Niacin Er] Rash    Family History  Problem Relation Age of Onset   Ovarian cancer Sister    Heart disease Father    Brain cancer Brother        Tumor    Prior to Admission medications   Medication Sig Start Date End Date Taking? Authorizing Provider  acetaminophen (TYLENOL) 500 MG tablet Take 1 tablet (500 mg total) by mouth 2 (two) times daily as needed. Patient taking differently: Take 500 mg by mouth 2 (two) times daily as needed for  mild pain. 01/13/19  Yes Wert, Margreta Journey, NP  amLODipine (NORVASC) 10 MG tablet Take 10 mg by mouth daily. 06/07/22  Yes [provider]  Apoaequorin (PREVAGEN PO) Take 1 tablet by mouth daily.   Yes [provider]  aspirin EC 81 MG tablet Take 1 tablet (81 mg total) by mouth 2 (two) times a day. Patient taking differently: Take 81 mg by mouth daily. 01/03/19  Yes Netta Cedars, MD  atenolol (TENORMIN) 50 MG tablet Take 50 mg by mouth daily.   Yes [provider]  atorvastatin (LIPITOR) 40 MG tablet Take 40 mg by mouth daily at 6 PM.  11/14/16  Yes [provider]  Biotin 10 MG CAPS Take 10 mg by mouth daily.   Yes [provider]  cholecalciferol (VITAMIN D3) 25 MCG (1000 UT) tablet Take 1,000 Units by mouth daily.   Yes  [provider]  clopidogrel (PLAVIX) 75 MG tablet Take 75 mg by mouth every evening.    Yes [provider]  diazepam (VALIUM) 5 MG tablet Take 2.5 mg by mouth every 8 (eight) hours as needed for anxiety.   Yes [provider]  finasteride (PROSCAR) 5 MG tablet Take 5 mg by mouth daily.   Yes [provider]  gabapentin (NEURONTIN) 100 MG capsule Take 100 mg by mouth 3 (three) times daily.   Yes [provider]  hydrALAZINE (APRESOLINE) 10 MG tablet Take 10 mg by mouth in the morning and at bedtime.   Yes [provider]  hydrochlorothiazide (MICROZIDE) 12.5 MG capsule Take 25 mg by mouth daily.   Yes [provider]  HYDROcodone bit-homatropine (HYCODAN) 5-1.5 MG/5ML syrup Take 5 mLs by mouth every 6 (six) hours as needed for cough. 05/16/22  Yes Alma Friendly, MD  iron polysaccharides (NIFEREX) 150 MG capsule Take 150 mg by mouth every Monday, Wednesday, and Friday.   Yes [provider]  losartan (COZAAR) 50 MG tablet Take 50 mg by mouth daily.   Yes [provider]  Magnesium 250 MG TABS Take 250 mg by mouth at bedtime.   Yes [provider]  Misc Natural Products (GLUCOSAMINE CHONDROITIN TRIPLE) TABS Take 1 tablet by mouth 2 (two) times daily.   Yes [provider]  Multiple Vitamin (MULTIVITAMIN WITH MINERALS) TABS tablet Take 1 tablet by mouth daily.   Yes [provider]  pantoprazole (PROTONIX) 40 MG tablet TAKE 1 TABLET BY MOUTH TWICE A DAY Patient taking differently: Take 40 mg by mouth at bedtime. 10/21/18  Yes Collene Gobble, MD  potassium chloride SA (K-DUR,KLOR-CON) 20 MEQ tablet Take 20 mEq by mouth every evening.  07/16/13  Yes [provider]  tamsulosin (FLOMAX) 0.4 MG CAPS capsule Take 1 capsule (0.4 mg total) by mouth daily after supper. 05/15/22  Yes Alma Friendly, MD    Physical Exam: Vitals:   07/09/22 1830 07/09/22 1845 07/09/22 1849 07/09/22 1900  BP: (!) 211/79 (!) 203/76  (!) 183/71  Pulse: 94 89  81  Resp: 16   15  Temp:   98.4 F (36.9 C)   TempSrc:   Oral   SpO2: (!) 87% 97%  97%  Weight:      Height:        Physical Exam Vitals and nursing note reviewed.  Constitutional:      General: He is in acute distress.     Appearance: Normal appearance. He is normal weight. He is not ill-appearing.     Comments: Acute distress due to hip pain  HENT:     Head: Normocephalic and atraumatic.     Nose: Nose normal.     Mouth/Throat:     Mouth: Mucous membranes are moist.     Pharynx: Oropharynx is clear.  Eyes:     Extraocular Movements: Extraocular movements intact.     Conjunctiva/sclera: Conjunctivae normal.     Pupils: Pupils are equal, round, and reactive to light.  Cardiovascular:     Rate and Rhythm: Normal rate and regular rhythm.     Pulses: Normal pulses.     Heart sounds: Normal heart sounds.  Pulmonary:     Effort: Pulmonary effort is normal.     Breath sounds: Normal breath sounds.  Abdominal:     General: Bowel sounds are normal.     Palpations: Abdomen is soft.  Musculoskeletal:  General: Tenderness and signs of injury present.      Cervical back: Normal range of motion and neck supple.     Comments: Suffering right hip pain. All movement causes pain  Skin:    General: Skin is warm and dry.  Neurological:     General: No focal deficit present.     Mental Status: He is alert and oriented to person, place, and time.  Psychiatric:        Mood and Affect: Mood normal.        Behavior: Behavior normal.      Labs on Admission: I have personally reviewed following labs and imaging studies  CBC: Recent Labs  Lab 07/09/22 1450  WBC 9.0  NEUTROABS 7.3  HGB 12.6*  HCT 38.5*  MCV 87.1  PLT 696   Basic Metabolic Panel: Recent Labs  Lab 07/09/22 1450  NA 141  K 3.8  CL 108  CO2 25  GLUCOSE 129*  BUN 15  CREATININE 0.96  CALCIUM 9.0   GFR: Estimated Creatinine Clearance: 48 mL/min (by C-G formula based on SCr of 0.96 mg/dL). Liver Function Tests: No results for input(s): "AST", "ALT", "ALKPHOS", "BILITOT", "PROT", "ALBUMIN" in the last 168 hours. No results for input(s): "LIPASE", "AMYLASE" in the last 168 hours. No results for input(s): "AMMONIA" in the last 168 hours. Coagulation Profile: Recent Labs  Lab 07/09/22 1450  INR 0.9   Cardiac Enzymes: No results for input(s): "CKTOTAL", "CKMB", "CKMBINDEX", "TROPONINI" in the last 168 hours. BNP (last 3 results) No results for input(s): "PROBNP" in the last 8760 hours. HbA1C: No results for input(s): "HGBA1C" in the last 72 hours. CBG: No results for input(s): "GLUCAP" in the last 168 hours. Lipid Profile: No results for input(s): "CHOL", "HDL", "LDLCALC", "TRIG", "CHOLHDL", "LDLDIRECT" in the last 72 hours. Thyroid Function Tests: No results for input(s): "TSH", "T4TOTAL", "FREET4", "T3FREE", "THYROIDAB" in the last 72 hours. Anemia Panel: No results for input(s): "VITAMINB12", "FOLATE", "FERRITIN", "TIBC", "IRON", "RETICCTPCT" in the last 72 hours. Urine analysis:    Component Value Date/Time   COLORURINE YELLOW 05/14/2022 0846   APPEARANCEUR  CLEAR 05/14/2022 0846   LABSPEC 1.019 05/14/2022 0846   PHURINE 5.0 05/14/2022 0846   GLUCOSEU NEGATIVE 05/14/2022 0846   HGBUR NEGATIVE 05/14/2022 0846   BILIRUBINUR NEGATIVE 05/14/2022 0846   KETONESUR 5 (A) 05/14/2022 0846   PROTEINUR NEGATIVE 05/14/2022 0846   UROBILINOGEN 0.2 10/28/2010 0909   NITRITE NEGATIVE 05/14/2022 0846   LEUKOCYTESUR NEGATIVE 05/14/2022 0846    Radiological Exams on Admission: I have personally reviewed images CT HEAD WO CONTRAST  Result Date: 07/09/2022 CLINICAL DATA:  [place] EXAM: CT HEAD WITHOUT CONTRAST TECHNIQUE: Contiguous axial images were obtained from the base of the skull through the vertex without intravenous contrast. RADIATION DOSE REDUCTION: This exam was performed according to the departmental dose-optimization program which includes automated exposure control, adjustment of the mA and/or kV according to patient size and/or use of iterative reconstruction technique. COMPARISON:  05/02/2022 FINDINGS: Brain: Stable confluent hypodensities throughout the periventricular white matter consistent with chronic small vessel ischemic changes. Chronic lacunar infarcts are again seen within the basal ganglia. No signs of acute infarct or hemorrhage. Lateral ventricles and remaining midline structures are unremarkable. No acute extra-axial fluid collections. No mass effect. Vascular: No hyperdense vessel or unexpected calcification. Skull: Small right parietal scalp hematoma. No underlying fracture. The remainder of the calvarium is unremarkable. Sinuses/Orbits: No acute finding. Other: None. IMPRESSION: 1. Small right parietal scalp hematoma.  No underlying  fracture. 2. No acute intracranial process. Electronically Signed   By: Randa Ngo M.D.   On: 07/09/2022 15:30   DG Hip Unilat  With Pelvis 2-3 Views Right  Result Date: 07/09/2022 CLINICAL DATA:  Right hip pain after fall. EXAM: DG HIP (WITH OR WITHOUT PELVIS) 2-3V RIGHT COMPARISON:  None Available.  FINDINGS: Signs of impacted subcapital femoral neck fracture identified involving the proximal right femur. Left hip appears intact. Mild bilateral hip osteoarthritis. Atherosclerotic calcifications identified. IMPRESSION: Impacted subcapital femoral neck fracture involving the proximal right femur. Electronically Signed   By: Kerby Moors M.D.   On: 07/09/2022 15:26   DG Chest 1 View  Result Date: 07/09/2022 CLINICAL DATA:  Status post mechanical fall with right hip pain. For preoperative evaluation. EXAM: CHEST  1 VIEW COMPARISON:  Chest radiograph dated 05/13/2022 FINDINGS: Normal lung volumes. No focal consolidations. Bibasilar linear opacities. No pleural effusion or pneumothorax. Similar cardiomediastinal silhouette. The visualized skeletal structures are unremarkable. IMPRESSION: 1. No acute cardiopulmonary process. 2. Bibasilar atelectasis. Electronically Signed   By: Darrin Nipper M.D.   On: 07/09/2022 15:23    EKG: I have personally reviewed EKG: NSR, no acute changes  Assessment/Plan Active Problems:   Intertrochanteric fracture of right femur [facility])   Essential hypertension   Coronary atherosclerosis   GERD    Assessment and Plan: Intertrochanteric fracture of right femur ([facility]) >>ASSESSMENT AND PLAN FOR CLOSED DISPLACED FRACTURE OF RIGHT FEMORAL NECK ([place]) WRITTEN ON 07/09/2022  7:45 PM BY Kattaleya Alia E, MD  Fracture as noted. Dr. Ralene Muskrat consulted  Plan Med-surg admit  Pain mgt: ibuprofen QID, APAP QID, Ketorolac q6 prn, MS for breakthru  For surgery 07/11/22  GERD Continue PPI  Coronary atherosclerosis Sees Dr. Adora Fridge. He has been stable. EKG nl.  Plan Continue all cardiac meds except plavix  Cleared for surgery  Essential hypertension BP stable.  Plan Continue home regimen       DVT prophylaxis: SQ Heparin Code Status: Full Code Family Communication: spoke with Eliezer Mccoy, wife. Answered questions  Disposition Plan: TBD  Consults called:  ortho- Dr. Alvan Dame  Admission status: Inpatient, Med-Surg   Adella Hare, MD [facility] 07/09/2022, 7:49 PM

## 2022-07-09 NOTE — ED Provider Notes (Signed)
[facility] DEPT Provider Note   CSN: [id number] Arrival date & time: 07/09/22  1351     History  Chief Complaint  Patient presents with   Fall   Hip Pain    Nathan Santiago is a 88 y.o. male.  HPI    88 year old male with history of hypertension, hyperlipidemia, CAD, PAD comes with a chief complaint of fall.  Patient states that he was trying to sit in a chair, when he lost balance and fell backwards.  He fell onto his right hip and is having pain over his right hip.  Patient denies any headache, neck pain, shoulder pain, chest pain.  Patient also denies any numbness or tingling.  He is on Plavix.  Home Medications Prior to Admission medications   Medication Sig Start Date End Date Taking? Authorizing Provider  acetaminophen (TYLENOL) 500 MG tablet Take 1 tablet (500 mg total) by mouth 2 (two) times daily as needed. Patient taking differently: Take 500 mg by mouth 2 (two) times daily as needed for mild pain. 01/13/19  Yes Wert, Margreta Journey, NP  amLODipine (NORVASC) 10 MG tablet Take 10 mg by mouth daily. 06/07/22  Yes [provider]  Apoaequorin (PREVAGEN PO) Take 1 tablet by mouth daily.   Yes [provider]  aspirin EC 81 MG tablet Take 1 tablet (81 mg total) by mouth 2 (two) times a day. Patient taking differently: Take 81 mg by mouth daily. 01/03/19  Yes Netta Cedars, MD  atenolol (TENORMIN) 50 MG tablet Take 50 mg by mouth daily.   Yes [provider]  atorvastatin (LIPITOR) 40 MG tablet Take 40 mg by mouth daily at 6 PM.  11/14/16  Yes [provider]  Biotin 10 MG CAPS Take 10 mg by mouth daily.   Yes [provider]  cholecalciferol (VITAMIN D3) 25 MCG (1000 UT) tablet Take 1,000 Units by mouth daily.   Yes [provider]  clopidogrel (PLAVIX) 75 MG tablet Take 75 mg by mouth every evening.    Yes [provider]  diazepam (VALIUM) 5 MG tablet Take 2.5 mg by mouth every 8 (eight)  hours as needed for anxiety.   Yes [provider]  finasteride (PROSCAR) 5 MG tablet Take 5 mg by mouth daily.   Yes [provider]  gabapentin (NEURONTIN) 100 MG capsule Take 100 mg by mouth 3 (three) times daily.   Yes [provider]  hydrALAZINE (APRESOLINE) 10 MG tablet Take 10 mg by mouth in the morning and at bedtime.   Yes [provider]  hydrochlorothiazide (MICROZIDE) 12.5 MG capsule Take 25 mg by mouth daily.   Yes [provider]  HYDROcodone bit-homatropine (HYCODAN) 5-1.5 MG/5ML syrup Take 5 mLs by mouth every 6 (six) hours as needed for cough. 05/16/22  Yes Alma Friendly, MD  iron polysaccharides (NIFEREX) 150 MG capsule Take 150 mg by mouth every Monday, Wednesday, and Friday.   Yes [provider]  losartan (COZAAR) 50 MG tablet Take 50 mg by mouth daily.   Yes [provider]  Magnesium 250 MG TABS Take 250 mg by mouth at bedtime.   Yes [provider]  Misc Natural Products (GLUCOSAMINE CHONDROITIN TRIPLE) TABS Take 1 tablet by mouth 2 (two) times daily.   Yes [provider]  Multiple Vitamin (MULTIVITAMIN WITH MINERALS) TABS tablet Take 1 tablet by mouth daily.   Yes [provider]  pantoprazole (PROTONIX) 40 MG tablet TAKE 1 TABLET BY MOUTH  TWICE A DAY Patient taking differently: Take 40 mg by mouth at bedtime. 10/21/18  Yes Collene Gobble, MD  potassium chloride SA (K-DUR,KLOR-CON) 20 MEQ tablet Take 20 mEq by mouth every evening.  07/16/13  Yes [provider]  tamsulosin (FLOMAX) 0.4 MG CAPS capsule Take 1 capsule (0.4 mg total) by mouth daily after supper. 05/15/22  Yes Alma Friendly, MD      Allergies    Lisinopril and Niaspan [niacin er]    Review of Systems   Review of Systems  All other systems reviewed and are negative.   Physical Exam Updated Vital Signs BP (!) 175/77 (BP Location: Right Arm)   Pulse 81   Temp 98.1 F (36.7 C) (Oral)    Resp 16   Ht '5\' 4"'$  (1.626 m)   Wt 73.9 kg   SpO2 93%   BMI 27.98 kg/m  Physical Exam Vitals and nursing note reviewed.  Constitutional:      Appearance: He is well-developed.  HENT:     Head: Atraumatic.  Eyes:     Extraocular Movements: Extraocular movements intact.     Pupils: Pupils are equal, round, and reactive to light.  Neck:     Comments: No midline c-spine tenderness, pt able to turn head to 45 degrees bilaterally without any pain and able to flex neck to the chest and extend without any pain or neurologic symptoms.  Cardiovascular:     Rate and Rhythm: Normal rate.  Pulmonary:     Effort: Pulmonary effort is normal.  Musculoskeletal:     Comments: Patient has a shortened, externally rotated right lower extremity.  Gross sensory exam is normal.  Skin is warm to touch, no skin discoloration noted.  Skin:    General: Skin is warm.  Neurological:     Mental Status: He is alert and oriented to person, place, and time.     ED Results / Procedures / Treatments   Labs (all labs ordered are listed, but only abnormal results are displayed) Labs Reviewed  BASIC METABOLIC PANEL - Abnormal; Notable for the following components:      Result Value   Glucose, Bld 129 (*)    All other components within normal limits  CBC WITH DIFFERENTIAL/PLATELET - Abnormal; Notable for the following components:   Hemoglobin 12.6 (*)    HCT 38.5 (*)    Abs Immature Granulocytes 0.08 (*)    All other components within normal limits  PROTIME-INR  TYPE AND SCREEN    EKG None  Radiology CT HEAD WO CONTRAST  Result Date: 07/09/2022 CLINICAL DATA:  [place] EXAM: CT HEAD WITHOUT CONTRAST TECHNIQUE: Contiguous axial images were obtained from the base of the skull through the vertex without intravenous contrast. RADIATION DOSE REDUCTION: This exam was performed according to the departmental dose-optimization program which includes automated exposure control, adjustment of the mA and/or kV  according to patient size and/or use of iterative reconstruction technique. COMPARISON:  05/02/2022 FINDINGS: Brain: Stable confluent hypodensities throughout the periventricular white matter consistent with chronic small vessel ischemic changes. Chronic lacunar infarcts are again seen within the basal ganglia. No signs of acute infarct or hemorrhage. Lateral ventricles and remaining midline structures are unremarkable. No acute extra-axial fluid collections. No mass effect. Vascular: No hyperdense vessel or unexpected calcification. Skull: Small right parietal scalp hematoma. No underlying fracture. The remainder of the calvarium is unremarkable. Sinuses/Orbits: No acute finding. Other: None. IMPRESSION: 1. Small right parietal scalp hematoma.  No underlying fracture. 2. No acute  intracranial process. Electronically Signed   By: Randa Ngo M.D.   On: 07/09/2022 15:30   DG Hip Unilat  With Pelvis 2-3 Views Right  Result Date: 07/09/2022 CLINICAL DATA:  Right hip pain after fall. EXAM: DG HIP (WITH OR WITHOUT PELVIS) 2-3V RIGHT COMPARISON:  None Available. FINDINGS: Signs of impacted subcapital femoral neck fracture identified involving the proximal right femur. Left hip appears intact. Mild bilateral hip osteoarthritis. Atherosclerotic calcifications identified. IMPRESSION: Impacted subcapital femoral neck fracture involving the proximal right femur. Electronically Signed   By: Kerby Moors M.D.   On: 07/09/2022 15:26   DG Chest 1 View  Result Date: 07/09/2022 CLINICAL DATA:  Status post mechanical fall with right hip pain. For preoperative evaluation. EXAM: CHEST  1 VIEW COMPARISON:  Chest radiograph dated 05/13/2022 FINDINGS: Normal lung volumes. No focal consolidations. Bibasilar linear opacities. No pleural effusion or pneumothorax. Similar cardiomediastinal silhouette. The visualized skeletal structures are unremarkable. IMPRESSION: 1. No acute cardiopulmonary process. 2. Bibasilar atelectasis.  Electronically Signed   By: Darrin Nipper M.D.   On: 07/09/2022 15:23    Procedures Procedures    Medications Ordered in ED Medications  fentaNYL (SUBLIMAZE) injection 50 mcg (50 mcg Intravenous Given 07/09/22 1608)    ED Course/ Medical Decision Making/ A&P                           Medical Decision Making Amount and/or Complexity of Data Reviewed Labs: ordered. Radiology: ordered.  Risk Prescription drug management. Decision regarding hospitalization.   This patient presents to the ED with chief complaint(s) of mechanical fall with pertinent past medical history of CAD, PAD on Plavix.The complaint involves an extensive differential diagnosis and also carries with it a high risk of complications and morbidity.    The differential diagnosis includes : Right-sided hip fracture, hip dislocation.  Traumatic subdural hematoma also in the differential. I feel comfortable clearing the C-spine.  Patient has had C-spine injury in the past.  He has no numbness, tingling and denies any neck pain, has no midline C-spine tenderness and is able to move the neck without any issues.  The initial plan is to get CT scan of the head, x-ray of the chest, x-ray of the right hip along with basic labs.   Additional history obtained: Additional history obtained from spouse Records reviewed previous admission documents -discharge summary from earlier this year reviewed.  Independent labs interpretation:  The following labs were independently interpreted: CBC, metabolic profile are reassuring.  Independent visualization and interpretation of imaging: - I independently visualized the following imaging with scope of interpretation limited to determining acute life threatening conditions related to emergency care: X-ray of the right hip, which revealed clear evidence of femoral neck fracture.  Treatment and Reassessment: Results of the ER workup discussed with the patient. He is made aware that he will  need admission to the hospital.  Consultation: - Consulted or discussed management/test interpretation with external professional: Dr. Orbie Hurst, orthopedic service.  He recommends that patient be admitted to hospital staff.  Patient will likely need surgery on Tuesday given that he is on Plavix.  Final Clinical Impression(s) / ED Diagnoses Final diagnoses:  Closed right hip fracture, initial encounter [facility])    Rx / DC Orders ED Discharge Orders     None         Varney Biles, MD 07/09/22 1612

## 2022-07-09 NOTE — Assessment & Plan Note (Signed)
>>  ASSESSMENT AND PLAN FOR CLOSED DISPLACED FRACTURE OF RIGHT FEMORAL NECK ([place]) WRITTEN ON 07/09/2022  7:45 PM BY Meagan Spease E, MD  Fracture as noted. Dr. Ralene Muskrat consulted  Plan Med-surg admit  Pain mgt: ibuprofen QID, APAP QID, Ketorolac q6 prn, MS for breakthru  For surgery 07/11/22

## 2022-07-09 NOTE — Assessment & Plan Note (Signed)
Fracture as noted. Dr. Ralene Muskrat consulted  Plan Med-surg admit  Pain mgt: ibuprofen QID, APAP QID, Ketorolac q6 prn, MS for breakthru  For surgery 07/11/22

## 2022-07-10 ENCOUNTER — Inpatient Hospital Stay (HOSPITAL_COMMUNITY): Payer: Medicare Other | Admitting: Anesthesiology

## 2022-07-10 DIAGNOSIS — S72144A Nondisplaced intertrochanteric fracture of right femur, initial encounter for closed fracture: Secondary | ICD-10-CM | POA: Diagnosis not present

## 2022-07-10 DIAGNOSIS — I10 Essential (primary) hypertension: Secondary | ICD-10-CM | POA: Diagnosis not present

## 2022-07-10 DIAGNOSIS — Z8679 Personal history of other diseases of the circulatory system: Secondary | ICD-10-CM

## 2022-07-10 DIAGNOSIS — K219 Gastro-esophageal reflux disease without esophagitis: Secondary | ICD-10-CM

## 2022-07-10 DIAGNOSIS — W19XXXA Unspecified fall, initial encounter: Secondary | ICD-10-CM | POA: Insufficient documentation

## 2022-07-10 DIAGNOSIS — Z01818 Encounter for other preprocedural examination: Secondary | ICD-10-CM

## 2022-07-10 LAB — SURGICAL PCR SCREEN
MRSA, PCR: NEGATIVE
Staphylococcus aureus: NEGATIVE

## 2022-07-10 MED ORDER — METHOCARBAMOL 1000 MG/10ML IJ SOLN: 500.0000 mg | Freq: Four times a day (QID) | INTRAVENOUS | Status: DC | PRN

## 2022-07-10 MED ORDER — LABETALOL HCL 5 MG/ML IV SOLN: 10.0000 mg | INTRAVENOUS | Status: DC | PRN

## 2022-07-10 MED ORDER — HYDROMORPHONE HCL 1 MG/ML IJ SOLN
0.5000 mg | INTRAMUSCULAR | Status: DC | PRN
  Administered 2022-07-10: 0.5 mg via INTRAVENOUS
  Filled 2022-07-10: qty 0.5

## 2022-07-10 MED ORDER — OXYCODONE HCL 5 MG PO TABS: 5.0000 mg | ORAL_TABLET | Freq: Four times a day (QID) | ORAL | Status: DC | PRN

## 2022-07-10 MED ORDER — BOOST / RESOURCE BREEZE PO LIQD CUSTOM
1.0000 | ORAL | Status: DC
  Administered 2022-07-10 – 2022-07-13 (×3): 1 via ORAL

## 2022-07-10 MED ORDER — FENTANYL CITRATE PF 50 MCG/ML IJ SOSY
PREFILLED_SYRINGE | INTRAMUSCULAR | Status: AC
  Administered 2022-07-10: 50 ug via INTRAVENOUS
  Filled 2022-07-10: qty 2

## 2022-07-10 MED ORDER — BUPIVACAINE HCL (PF) 0.25 % IJ SOLN
INTRAMUSCULAR | Status: DC | PRN
  Administered 2022-07-10: 20 mL via PERINEURAL

## 2022-07-10 NOTE — Anesthesia Preprocedure Evaluation (Addendum)
Anesthesia Evaluation  Patient identified by MRN, date of birth, ID band Patient awake    Reviewed: Allergy & Precautions, NPO status , Patient's Chart, lab work & pertinent test results  History of Anesthesia Complications Negative for: history of anesthetic complications  Airway Mallampati: III       Dental   Pulmonary former smoker   Pulmonary exam normal breath sounds clear to auscultation       Cardiovascular hypertension, Pt. on medications and Pt. on home beta blockers + CAD, + Cardiac Stents (s/p stenting of his proximal mid and distal RCA using overlapping stents as well as the proximal AV groove circumflex with a known occluded OM branch which is old.) and + Peripheral Vascular Disease (patent distal aortic stent and iliac arteries with a high-frequency signal in the distal left common femoral and distal right SFA)  + dysrhythmias (prolonged QT)  Rhythm:Regular Rate:Normal  AAA, HLD, bilateral carotid disease s/p right CEA   Neuro/Psych vertigo    GI/Hepatic PUD,GERD  ,,gastroparesis   Endo/Other    Renal/GU Renal disease (h/o nephrolithiasis)     Musculoskeletal   Abdominal   Peds  Hematology  (+) Blood dyscrasia (iron deficiency), anemia   Anesthesia Other Findings h/o CAD, GERD, HTN, PVD, HLD had a fall at home with subsequent severe pain in the right hip and inability to bear weight.  Last Plavix: 07/09/22  Reproductive/Obstetrics                             Anesthesia Physical Anesthesia Plan  ASA: 3  Anesthesia Plan: Regional   Post-op Pain Management: Regional block*   Induction:   PONV Risk Score and Plan:   Airway Management Planned:   Additional Equipment:   Intra-op Plan:   Post-operative Plan:   Informed Consent: I have reviewed the patients History and Physical, chart, labs and discussed the procedure including the risks, benefits and alternatives for  the proposed anesthesia with the patient or authorized representative who has indicated his/her understanding and acceptance.       Plan Discussed with:   Anesthesia Plan Comments: (Discussed potential risks of nerve blocks including, but not limited to, infection, bleeding, nerve damage, seizures, pneumothorax, respiratory depression, and potential failure of the block. Alternatives to nerve blocks discussed. All questions answered. )       Anesthesia Quick Evaluation

## 2022-07-10 NOTE — Plan of Care (Signed)

## 2022-07-10 NOTE — Anesthesia Procedure Notes (Addendum)
Anesthesia Regional Block: Femoral nerve block   Pre-Anesthetic Checklist: , timeout performed,  Correct Patient, Correct Site, Correct Laterality,  Correct Procedure, Correct Position, site marked,  Risks and benefits discussed,  Surgical consent,  Pre-op evaluation,  At surgeon's request and post-op pain management  Laterality: Right  Prep: chloraprep       Needles:  Injection technique: Single-shot  Needle Type: Echogenic Stimulator Needle     Needle Length: 9cm  Needle Gauge: 21     Additional Needles:   Procedures:,,,, ultrasound used (permanent image in chart),,    Narrative:  Start time: 07/10/2022 12:20 PM End time: 07/10/2022 12:25 PM Injection made incrementally with aspirations every 5 mL.  Performed by: Personally  Anesthesiologist: Nilda Simmer, MD  Additional Notes: Discussed risks and benefits of nerve block including, but not limited to, prolonged and/or permanent nerve injury involving sensory and/or motor function. Monitors were applied and a time-out was performed. The nerve and associated structures were visualized under ultrasound guidance. After negative aspiration, local anesthetic was slowly injected around the nerve. There was no evidence of high pressure during the procedure. There were no paresthesias. VSS remained stable and the patient tolerated the procedure well.

## 2022-07-10 NOTE — Progress Notes (Signed)
Initial Nutrition Assessment  DOCUMENTATION CODES:   Not applicable  INTERVENTION:  - Heart Healthy diet per MD.  - Boost Breeze once daily to support intake for meeting increased needs. - Encourage intake at all meals, focusing on protein rich food sources.   NUTRITION DIAGNOSIS:   Increased nutrient needs related to other (see comment) (femur fracture) as evidenced by estimated needs.  GOAL:   Patient will meet greater than or equal to 90% of their needs  MONITOR:   PO intake, Supplement acceptance, Weight trends  REASON FOR ASSESSMENT:   Consult Hip fracture protocol  ASSESSMENT:    88 y.o. male h/o CAD, GERD, HTN, PVD, HLD had a fall at home with subsequent severe pain in the right hip. Found to have intertrochanteric fx of R femur.   Patient resting in bed at time of visit. He reports a UBW of 163# and denied recent changes in weight.  Per EMR, no recent changes in weight over the past year.  Patient endorses eating well PTA, usually three meals a day. Food recall as below: Breakfast: cereal Lunch: soup Dinner: meal provided by pts retirement community  Patient reports his appetite is always good, sometimes "too good". Admits his current appetite is decreased but was able to have 50% of his breakfast today. Discussed importance of eating well during admission with 3 meals a day, focusing on protein rich food sources to promote healing. Patient agreeable to try 1 Boost per day to support intake and provide additional protein.    Medications reviewed and include: Senokot  Labs reviewed:  -   NUTRITION - FOCUSED PHYSICAL EXAM:  Flowsheet Row Most Recent Value  Orbital Region No depletion  Upper Arm Region Mild depletion  Thoracic and Lumbar Region No depletion  Buccal Region No depletion  Temple Region Mild depletion  Clavicle Bone Region No depletion  Clavicle and Acromion Bone Region Mild depletion  Scapular Bone Region Unable to assess  Dorsal Hand Mild  depletion  Patellar Region No depletion  Anterior Thigh Region No depletion  Posterior Calf Region No depletion  Edema (RD Assessment) Mild  Hair Reviewed  Eyes Reviewed  Mouth Reviewed  Skin Reviewed  Nails Reviewed       Diet Order:   Diet Order             Diet NPO time specified  Diet effective midnight           Diet Heart Room service appropriate? Yes; Fluid consistency: Thin  Diet effective now                   EDUCATION NEEDS:  Education needs have been addressed  Skin:  Skin Assessment: Reviewed RN Assessment  Last BM:  12/9  Height:  Ht Readings from Last 1 Encounters:  07/09/22 '5\' 4"'$  (1.626 m)   Weight:  Wt Readings from Last 1 Encounters:  07/09/22 73.9 kg    BMI:  Body mass index is 27.98 kg/m.  Estimated Nutritional Needs:  Kcal:  2100-2200 kcals Protein:  90-105 grams Fluid:  >/= 2.1L    Samson Frederic RD, LDN For contact information, refer to [facility].

## 2022-07-10 NOTE — TOC Initial Note (Signed)
Transition of Care [facility]) - Initial/Assessment Note    Patient Details  Name: Nathan Santiago MRN: [id number] Date of Birth: 13-Oct-1930  [facility]) CM/SW Contact:    Lennart Pall, LCSW Phone Number: 07/10/2022, 3:01 PM  Clinical Narrative:                 Met with Nathan Santiago and spouse today to introduce TOC/CSW role with dc planning.  Nathan Santiago confirms [place] and [place] live [place] apt at [facility] community.  They anticipate need for SNF rehab at [facility] after surgery.  Aware this CSW will assist with this dc plan.  Have alerted admissions director of SNF at [facility], Donna Tessitore, [place] and she confirms space is ready for Nathan Santiago when he is discharged.  Continue to follow.  Expected Discharge Plan: Skilled Nursing Facility Barriers to Discharge: Continued Medical Work up   Patient Goals and CMS Choice Patient states their goals for this hospitalization and ongoing recovery are:: return to [facility] via rehab      Expected Discharge Plan and Services Expected Discharge Plan: [place] In-house Referral: Clinical Social Work   Post Acute Care Choice: [place] Living arrangements for the past 2 months: [place]                 DME Arranged: N/A DME Agency: NA                  Prior Living Arrangements/Services Living arrangements for the past 2 months: [place] Lives with:: Spouse Patient language and need for interpreter reviewed:: Yes Do you feel safe going back to the place where you live?: Yes      Need for Family Participation in Patient Care: Yes (Comment) Care giver support system in place?: Yes (comment)   Criminal Activity/Legal Involvement Pertinent to Current Situation/Hospitalization: No - Comment as needed  Activities of Daily Living Home Assistive Devices/Equipment: Grab bars in shower, Walker (specify type), Eyeglasses, Dentures (specify type) (dentures lower) ADL Screening  (condition at time of admission) Patient's cognitive ability adequate to safely complete daily activities?: Yes Is the patient deaf or have difficulty hearing?: No Does the patient have difficulty seeing, even when wearing glasses/contacts?: No Does the patient have difficulty concentrating, remembering, or making decisions?: No Patient able to express need for assistance with ADLs?: Yes Does the patient have difficulty dressing or bathing?: No Independently performs ADLs?: Yes (appropriate for developmental age) Does the patient have difficulty walking or climbing stairs?: Yes Weakness of Legs: Right Weakness of Arms/Hands: None  Permission Sought/Granted Permission sought to share information with : Family Supports, Customer service manager Permission granted to share information with : Yes, Verbal Permission Granted  Share Information with NAME: wife, Nathan Santiago @ [phone]  Permission granted to share info w AGENCY: [facility]        Emotional Assessment Appearance:: Appears stated age Attitude/Demeanor/Rapport: Gracious Affect (typically observed): Accepting, Pleasant Orientation: : Oriented to Self, Oriented to Place, Oriented to  Time, Oriented to Situation Alcohol / Substance Use: Not Applicable Psych Involvement: No (comment)  Admission diagnosis:  Intertrochanteric fracture of right femur ([place]) [S72.141A] Closed right hip fracture, initial encounter [facility]) [S72.001A] Patient Active Problem List   Diagnosis Date Noted   Preoperative clearance 07/10/2022   Accidental fall 07/10/2022   Intertrochanteric fracture of right femur ([place]) 07/09/2022   Neuropathy 05/13/2022   Status post total knee replacement, left 01/03/2019   Pain in left knee  08/05/2018   Pain in right knee 08/05/2018   Foreign body in ear 12/21/2017   Hematuria 10/10/2017   Gait abnormality 02/13/2017   UTI (urinary tract infection) 11/22/2016   Vertigo 11/22/2016   Hypokalemia  11/22/2016   Urinary frequency 09/08/2016   Claudication ([place]) 04/24/2016   Sensorineural hearing loss (SNHL), bilateral 03/09/2016   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   Peripheral arterial disease ([place]) 08/06/2013   GASTROPARESIS 02/25/2009   COLONIC POLYPS, ADENOMATOUS, HX OF 02/22/2009   HYPERCHOLESTEROLEMIA 09/26/2007   Essential hypertension 09/26/2007   History of CAD (coronary artery disease) 09/26/2007   Allergic rhinitis 09/26/2007   GERD 09/26/2007   Peptic ulcer 95/74/7340   UMBILICAL HERNIA 37/03/6437   NEPHROLITHIASIS 09/26/2007   PCP:  [name], MD Pharmacy:   [id number] [place] [address]Phone: [phone]: [phone] [place] [address]Phone: [phone]: [phone]    Social Determinants of Health (SDOH) Interventions Food Insecurity Interventions: Intervention Not Indicated Housing Interventions: Intervention Not Indicated Transportation Interventions: Intervention Not Indicated Utilities Interventions: Intervention Not Indicated  Readmission Risk Interventions    07/10/2022    2:59 PM  Readmission Risk Prevention Plan  Post Dischage Appt Complete  Medication Screening Complete  Transportation Screening Complete

## 2022-07-10 NOTE — Consult Note (Signed)
Reason for Consult: right hip fracture Referring Physician: Cyndia Skeeters, MD  Nathan Santiago is an 88 y.o. male.  HPI: Nathan Santiago, a retired Arboriculturist, with h/o CAD, GERD, HTN, PVD, HLD had a fall at home with subsequent severe pain in the right hip and inability to bear weight. He presents to [facility] for further evaluation.   Fell backward out of a chair  Past Medical History:  Diagnosis Date   AAA (abdominal aortic aneurysm) ([place])    asymptomatic   AAA (abdominal aortic aneurysm) ([place]) 2010   peripheral  angiogram-- bilateral SFA DISEASE  and 60 to 70% infrarenaal abd. aortic stenosis with 15 -mm gradient   Adenomatous colon polyp 11/2003   CAD (coronary artery disease)    Gait abnormality 02/13/2017   Gastroparesis    pt denies   GERD (gastroesophageal reflux disease)    w/ LPR   History of kidney stones    x2   Hyperlipidemia    Hypertension    IDA (iron deficiency anemia)    Peripheral arterial disease ([place])    RCEA  by Dr Lemar Livings   PUD (peptic ulcer disease)    pt unaware   Vertigo     Past Surgical History:  Procedure Laterality Date   ABDOMINAL AORTOGRAM W/LOWER EXTREMITY Bilateral 08/05/2018   Procedure: ABDOMINAL AORTOGRAM W/LOWER EXTREMITY;  Surgeon: Lorretta Harp, MD;  Location: [facility];  Service: Cardiovascular;  Laterality: Bilateral;   ABDOMINAL AORTOGRAM W/LOWER EXTREMITY N/A 09/26/2021   Procedure: ABDOMINAL AORTOGRAM W/LOWER EXTREMITY;  Surgeon: Lorretta Harp, MD;  Location: [facility];  Service: Cardiovascular;  Laterality: N/A;   AORTOGRAM  04/24/2016    Abdominal aortogram, bilateral iliac angiogram, bifemoral runoff   CARDIAC CATHETERIZATION  05/12/2005   RCA   carotid doppler  01/24/2013   RICA endarterectomy,left CCA 0-49%; left bulb and prox ICA 50-69%; bilaateral subclavian < 50%   CAROTID ENDARTERECTOMY  11/01/2010   CATARACT EXTRACTION     COLONOSCOPY     CORONARY ANGIOPLASTY  05/18/2005   2 STENTS distal RCA AND  PROXIMAL-MID RCA   5 total stents per pt.   CYSTOSCOPY/URETEROSCOPY/HOLMIUM LASER/STENT PLACEMENT Left 01/11/2018   Procedure: LEFT URETEROSCOPY/HOLMIUM LASER/STENT PLACEMENT;  Surgeon: Ardis Hughs, MD;  [facility];  Service: Urology;  Laterality: Left;   DOPPLER ECHOCARDIOGRAPHY  02/07/2012   EF 55%,SHOWED NO ISCHEMIA    HERNIA REPAIR     umbilical   lower arterial  doppler  02/04/2013   aotra 1.5 x 1.5 cm; distal abd aorta 70-99%,proximal common iliac arteries -very stenotic with increased velocities>50%,may be falsely elevated as a result of residual plaque from the distal aorta stenosis   lower extremity doppler  June 18 ,2013   ABI'S ABNORMAL, RABI was 0.88 and LABI 0.75 ,with 3-vessel  run off   NM MYOVIEW LTD  MAY 23,2011   showed no significant ischemia;   NM MYOVIEW LTD  04/22/2008   ef 77%,exercise capcity 6 METS ,exaggerated blood pressure response to exercise   PERIPHERAL VASCULAR CATHETERIZATION N/A 04/24/2016   Procedure: Lower Extremity Angiography;  Surgeon: Lorretta Harp, MD;  Location: [facility];  Service: Cardiovascular;  Laterality: N/A;   PERIPHERAL VASCULAR CATHETERIZATION  04/24/2016   Procedure: Peripheral Vascular Intervention;  Surgeon: Lorretta Harp, MD;  Location: [facility];  Service: Cardiovascular;;  Aorta   retrograde central aortic catheterization  05/19/2005   cutting balloon atherectomy, c-circ stenosis with DES STENTING CYPHER  TONSILLECTOMY     TOTAL KNEE ARTHROPLASTY Left 01/03/2019   Procedure: TOTAL KNEE ARTHROPLASTY;  Surgeon: Netta Cedars, MD;  [facility];  Service: Orthopedics;  Laterality: Left;  with IS block   TRANSURETHRAL RESECTION OF PROSTATE N/A 09/08/2016   Procedure: TRANSURETHRAL RESECTION OF THE PROSTATE (TURP);  Surgeon: Ardis Hughs, MD;  [facility];  Service: Urology;  Laterality: N/A;   TRANSURETHRAL RESECTION OF PROSTATE     10-10-17  Dr. Louis Meckel   TRANSURETHRAL RESECTION OF PROSTATE  N/A 10/10/2017   Procedure: TRANSURETHRAL RESECTION OF THE PROSTATE (TURP);  Surgeon: Ardis Hughs, MD;  [facility];  Service: Urology;  Laterality: N/A;   VASECTOMY      Family History  Problem Relation Age of Onset   Ovarian cancer Sister    Heart disease Father    Brain cancer Brother        Tumor    Social History:  reports that he quit smoking about 53 years ago. His smoking use included cigarettes. He has a 40.00 pack-year smoking history. He has never used smokeless tobacco. He reports that he does not currently use alcohol after a past usage of about 4.0 standard drinks of alcohol per week. He reports that he does not use drugs.  Allergies:  Allergies  Allergen Reactions   Lisinopril Cough   Niaspan [Niacin Er] Rash    Medications: I have reviewed the patient's current medications. Scheduled:  acetaminophen  650 mg Oral QID   amLODipine  10 mg Oral Daily   aspirin EC  81 mg Oral Daily   atenolol  50 mg Oral Daily   atorvastatin  40 mg Oral q1800   finasteride  5 mg Oral Daily   gabapentin  100 mg Oral TID   heparin  5,000 Units Subcutaneous Q8H   hydrALAZINE  10 mg Oral BH-qamhs   hydrochlorothiazide  25 mg Oral Daily   ibuprofen  400 mg Oral QID   iron polysaccharides  150 mg Oral Q M,W,F   losartan  50 mg Oral Daily   pantoprazole  40 mg Oral QHS   potassium chloride SA  20 mEq Oral QPM   senna  1 tablet Oral BID   tamsulosin  0.4 mg Oral QPC supper    Results for orders placed or performed during the hospital encounter of 07/09/22 (from the past 24 hour(s))  Basic metabolic panel     Status: Abnormal   Collection Time: 07/09/22  2:50 PM  Result Value Ref Range   Sodium 141 135 - 145 mmol/L   Potassium 3.8 3.5 - 5.1 mmol/L   Chloride 108 98 - 111 mmol/L   CO2 25 22 - 32 mmol/L   Glucose, Bld 129 (H) 70 - 99 mg/dL   BUN 15 8 - 23 mg/dL   Creatinine, Ser 0.96 0.61 - 1.24 mg/dL   Calcium 9.0 8.9 - 10.3 mg/dL   GFR, Estimated >60 >60 mL/min    Anion gap 8 5 - 15  CBC with Differential     Status: Abnormal   Collection Time: 07/09/22  2:50 PM  Result Value Ref Range   WBC 9.0 4.0 - 10.5 K/uL   RBC 4.42 4.22 - 5.81 MIL/uL   Hemoglobin 12.6 (L) 13.0 - 17.0 g/dL   HCT 38.5 (L) 39.0 - 52.0 %   MCV 87.1 80.0 - 100.0 fL   MCH 28.5 26.0 - 34.0 pg   MCHC 32.7 30.0 - 36.0 g/dL   RDW  13.1 11.5 - 15.5 %   Platelets 257 150 - 400 K/uL   nRBC 0.0 0.0 - 0.2 %   Neutrophils Relative % 81 %   Neutro Abs 7.3 1.7 - 7.7 K/uL   Lymphocytes Relative 10 %   Lymphs Abs 0.9 0.7 - 4.0 K/uL   Monocytes Relative 6 %   Monocytes Absolute 0.5 0.1 - 1.0 K/uL   Eosinophils Relative 2 %   Eosinophils Absolute 0.2 0.0 - 0.5 K/uL   Basophils Relative 0 %   Basophils Absolute 0.0 0.0 - 0.1 K/uL   Immature Granulocytes 1 %   Abs Immature Granulocytes 0.08 (H) 0.00 - 0.07 K/uL  Protime-INR     Status: None   Collection Time: 07/09/22  2:50 PM  Result Value Ref Range   Prothrombin Time 12.5 11.4 - 15.2 seconds   INR 0.9 0.8 - 1.2  Type and screen [place]     Status: None   Collection Time: 07/09/22  2:50 PM  Result Value Ref Range   ABO/RH(D) O POS    Antibody Screen NEG    Sample Expiration      07/12/2022,2359 Performed at [facility], [place] [address], [place], [place] [address]     X-ray: CLINICAL DATA:  Right hip pain after fall.   EXAM: DG HIP (WITH OR WITHOUT PELVIS) 2-3V RIGHT   COMPARISON:  None Available.   FINDINGS: Signs of impacted subcapital femoral neck fracture identified involving the proximal right femur. Left hip appears intact. Mild bilateral hip osteoarthritis. Atherosclerotic calcifications identified.   IMPRESSION: Impacted subcapital femoral neck fracture involving the proximal right femur.     Electronically Signed   By: Kerby Moors M.D.  ROS: Review of Systems  Constitutional: Negative.   HENT: Negative.    Eyes: Negative.   Respiratory: Negative.     Cardiovascular: Negative.   Gastrointestinal: Negative.   Genitourinary: Negative.   Musculoskeletal:  Positive for joint pain.       Right hip pain  Skin: Negative.   Neurological: Negative.   Endo/Heme/Allergies: Negative.   Psychiatric/Behavioral: Negative.  Blood pressure (!) 170/69, pulse 76, temperature 98 F (36.7 C), temperature source Oral, resp. rate 18, height '5\' 4"'$  (1.626 m), weight 73.9 kg, SpO2 94 %.  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 He presents to [facility] for further evaluation.    ED Course: afebrile, BP 183/71 (patient in pain) HR 81  RR 15. Lab - glucose 129, CBCD nl, Cmet nl. CXR- NAD, EKG NSR, no acute changes. Hip film right - impacted subcapital femoral neck fracture. Dr. Alvan Dame consulted for orthopedics. [facility] called to admit patient for medical and pain mgt.    Review of Systems:  Review of Systems  Constitutional: Negative.   HENT: Negative.    Eyes: Negative.   Respiratory: Negative.    Cardiovascular: Negative.   Gastrointestinal: Negative.   Genitourinary: Negative.   Musculoskeletal:  Positive for joint pain.  Right hip pain  Skin: Negative.   Neurological: Negative.   Endo/Heme/Allergies: Negative.   Psychiatric/Behavioral: Negative.            Past Medical History:  Diagnosis Date   AAA (abdominal aortic aneurysm) ([place])      asymptomatic   AAA (abdominal aortic aneurysm) ([place]) 2010    peripheral  angiogram-- bilateral SFA DISEASE  and 60 to 70% infrarenaal abd. aortic stenosis  with 15 -mm gradient   Adenomatous colon polyp 11/2003   CAD (coronary artery disease)     Gait abnormality 02/13/2017   Gastroparesis      pt denies   GERD (gastroesophageal reflux disease)      w/ LPR   History of kidney stones      x2   Hyperlipidemia     Hypertension     IDA (iron deficiency anemia)     Peripheral arterial disease ([place])      RCEA  by Dr Lemar Livings   PUD (peptic ulcer disease)      pt unaware   Vertigo             Past Surgical History:  Procedure Laterality Date   ABDOMINAL AORTOGRAM W/LOWER EXTREMITY Bilateral 08/05/2018    Procedure: ABDOMINAL AORTOGRAM W/LOWER EXTREMITY;  Surgeon: Lorretta Harp, MD;  Location: [facility];  Service: Cardiovascular;  Laterality: Bilateral;   ABDOMINAL AORTOGRAM W/LOWER EXTREMITY N/A 09/26/2021    Procedure: ABDOMINAL AORTOGRAM W/LOWER EXTREMITY;  Surgeon: Lorretta Harp, MD;  Location: [facility];  Service: Cardiovascular;  Laterality: N/A;   AORTOGRAM   04/24/2016     Abdominal aortogram, bilateral iliac angiogram, bifemoral runoff   CARDIAC CATHETERIZATION   05/12/2005    RCA   carotid doppler   01/24/2013    RICA endarterectomy,left CCA 0-49%; left bulb and prox ICA 50-69%; bilaateral subclavian < 50%   CAROTID ENDARTERECTOMY   11/01/2010   CATARACT EXTRACTION       COLONOSCOPY       CORONARY ANGIOPLASTY   05/18/2005    2 STENTS distal RCA AND PROXIMAL-MID RCA   5 total stents per pt.   CYSTOSCOPY/URETEROSCOPY/HOLMIUM LASER/STENT PLACEMENT Left 01/11/2018    Procedure: LEFT URETEROSCOPY/HOLMIUM LASER/STENT PLACEMENT;  Surgeon: Ardis Hughs, MD;  [facility];  Service: Urology;  Laterality: Left;   DOPPLER ECHOCARDIOGRAPHY   02/07/2012    EF 55%,SHOWED NO ISCHEMIA    HERNIA REPAIR        umbilical   lower arterial  doppler   02/04/2013    aotra 1.5 x 1.5 cm; distal abd aorta 70-99%,proximal common iliac arteries -very stenotic with increased velocities>50%,may be falsely elevated as  a result of residual plaque from the distal aorta stenosis   lower extremity doppler   June 18 ,2013    ABI'S ABNORMAL, RABI was 0.88 and LABI 0.75 ,with 3-vessel  run off   NM MYOVIEW LTD   MAY 23,2011    showed no significant ischemia;   NM MYOVIEW LTD   04/22/2008    ef 77%,exercise capcity 6 METS ,exaggerated blood pressure response to exercise   PERIPHERAL VASCULAR CATHETERIZATION N/A 04/24/2016    Procedure: Lower Extremity Angiography;  Surgeon: Lorretta Harp, MD;  Location: [facility];  Service: Cardiovascular;  Laterality: N/A;   PERIPHERAL VASCULAR CATHETERIZATION   04/24/2016    Procedure: Peripheral Vascular Intervention;  Surgeon: Lorretta Harp, MD;  Location: [facility];  Service: Cardiovascular;;  Aorta  retrograde central aortic catheterization   05/19/2005    cutting balloon atherectomy, c-circ stenosis with DES STENTING CYPHER   TONSILLECTOMY       TOTAL KNEE ARTHROPLASTY Left 01/03/2019    Procedure: TOTAL KNEE ARTHROPLASTY;  Surgeon: Netta Cedars, MD;  [facility];  Service: Orthopedics;  Laterality: Left;  with IS block   TRANSURETHRAL RESECTION OF PROSTATE N/A 09/08/2016    Procedure: TRANSURETHRAL RESECTION OF THE PROSTATE (TURP);  Surgeon: Ardis Hughs, MD;  [facility];  Service: Urology;  Laterality: N/A;   TRANSURETHRAL RESECTION OF PROSTATE        10-10-17  Dr. Louis Meckel   TRANSURETHRAL RESECTION OF PROSTATE N/A 10/10/2017    Procedure: TRANSURETHRAL RESECTION OF THE PROSTATE (TURP);  Surgeon: Ardis Hughs, MD;  [facility];  Service: Urology;  Laterality: N/A;   VASECTOMY          Soc Hx - married. Has a son. LIves with wife at [employer].    reports that he quit smoking about 53 years ago. His smoking use included cigarettes. He has a 40.00 pack-year smoking history. He has never used smokeless tobacco. He reports that he does not currently use alcohol after a past usage of about 4.0 standard drinks of alcohol per week.  He reports that he does not use drugs.       Allergies  Allergen Reactions   Lisinopril Cough   Niaspan [Niacin Er] Rash           Family History  Problem Relation Age of Onset   Ovarian cancer Sister     Heart disease Father     Brain cancer Brother          Tumor             Prior to Admission medications   Medication Sig Start Date End Date Taking? Authorizing Provider  acetaminophen (TYLENOL) 500 MG tablet Take 1 tablet (500 mg total) by mouth 2 (two) times daily as needed. Patient taking differently: Take 500 mg by mouth 2 (two) times daily as needed for mild pain. 01/13/19   Yes Wert, Margreta Journey, NP  amLODipine (NORVASC) 10 MG tablet Take 10 mg by mouth daily. 06/07/22   Yes [provider]  Apoaequorin (PREVAGEN PO) Take 1 tablet by mouth daily.     Yes [provider]  aspirin EC 81 MG tablet Take 1 tablet (81 mg total) by mouth 2 (two) times a day. Patient taking differently: Take 81 mg by mouth daily. 01/03/19   Yes Netta Cedars, MD  atenolol (TENORMIN) 50 MG tablet Take 50 mg by mouth daily.     Yes [provider]  atorvastatin (LIPITOR) 40 MG tablet Take 40 mg by mouth daily at 6 PM.  11/14/16   Yes [provider]  Biotin 10 MG CAPS Take 10 mg by mouth daily.     Yes [provider]  cholecalciferol (VITAMIN D3) 25 MCG (1000 UT) tablet Take 1,000 Units by mouth daily.     Yes [provider]  clopidogrel (PLAVIX) 75 MG tablet Take 75 mg by mouth every evening.      Yes [provider]  diazepam (VALIUM) 5 MG tablet Take 2.5 mg by mouth every 8 (eight) hours as needed for anxiety.     Yes [provider]  finasteride (PROSCAR) 5 MG tablet Take 5 mg by mouth daily.     Yes [provider]  gabapentin (NEURONTIN) 100 MG capsule Take  100 mg by mouth 3 (three) times daily.     Yes [provider]  hydrALAZINE (APRESOLINE) 10 MG tablet Take 10 mg by mouth in the morning and at bedtime.      Yes [provider]  hydrochlorothiazide (MICROZIDE) 12.5 MG capsule Take 25 mg by mouth daily.     Yes [provider]  HYDROcodone bit-homatropine (HYCODAN) 5-1.5 MG/5ML syrup Take 5 mLs by mouth every 6 (six) hours as needed for cough. 05/16/22   Yes Alma Friendly, MD  iron polysaccharides (NIFEREX) 150 MG capsule Take 150 mg by mouth every Monday, Wednesday, and Friday.     Yes [provider]  losartan (COZAAR) 50 MG tablet Take 50 mg by mouth daily.     Yes [provider]  Magnesium 250 MG TABS Take 250 mg by mouth at bedtime.     Yes [provider]  Misc Natural Products (GLUCOSAMINE CHONDROITIN TRIPLE) TABS Take 1 tablet by mouth 2 (two) times daily.     Yes [provider]  Multiple Vitamin (MULTIVITAMIN WITH MINERALS) TABS tablet Take 1 tablet by mouth daily.     Yes [provider]  pantoprazole (PROTONIX) 40 MG tablet TAKE 1 TABLET BY MOUTH TWICE A DAY Patient taking differently: Take 40 mg by mouth at bedtime. 10/21/18   Yes Collene Gobble, MD  potassium chloride SA (K-DUR,KLOR-CON) 20 MEQ tablet Take 20 mEq by mouth every evening.  07/16/13   Yes [provider]  tamsulosin (FLOMAX) 0.4 MG CAPS capsule Take 1 capsule (0.4 mg total) by mouth daily after supper. 05/15/22   Yes Alma Friendly, MD      Physical Exam:       Vitals:    07/09/22 1830 07/09/22 1845 07/09/22 1849 07/09/22 1900  BP: (!) 211/79 (!) 203/76   (!) 183/71  Pulse: 94 89   81  Resp: 16     15  Temp:     98.4 F (36.9 C)    TempSrc:     Oral    SpO2: (!) 87% 97%   97%  Weight:          Height:              Physical Exam Vitals and nursing note reviewed.  Constitutional:      General: He is in acute distress.     Appearance: Normal appearance. He is normal weight. He is not ill-appearing.     Comments: Acute distress due to hip pain  HENT:     Head: Normocephalic and atraumatic.     Nose: Nose normal.      Mouth/Throat:     Mouth: Mucous membranes are moist.     Pharynx: Oropharynx is clear.  Eyes:     Extraocular Movements: Extraocular movements intact.     Conjunctiva/sclera: Conjunctivae normal.     Pupils: Pupils are equal, round, and reactive to light.  Cardiovascular:     Rate and Rhythm: Normal rate and regular rhythm.     Pulses: Normal pulses.     Heart sounds: Normal heart sounds.  Pulmonary:     Effort: Pulmonary effort is normal.     Breath sounds: Normal breath sounds.  Abdominal:     General: Bowel sounds are normal.     Palpations: Abdomen is soft.  Musculoskeletal:        General: Tenderness and signs of injury present.     Cervical back: Normal range of motion and  neck supple.     Comments: Suffering right hip pain. All movement causes pain  Skin:    General: Skin is warm and dry.  Neurological:     General: No focal deficit present.     Mental Status: He is alert and oriented to person, place, and time.  Psychiatric:        Mood and Affect: Mood normal.        Behavior: Behavior normal.     Assessment/Plan: Right hip femoral neck fracture  Plan: Plan for OR tomorrow for right THR Urgent orders placed Consent issues discussed  Mauri Pole 07/10/2022, 7:13 AM

## 2022-07-10 NOTE — Progress Notes (Signed)
PROGRESS NOTE  Nathan Santiago [facility] DEY:[id number] DOB: 06/24/1931   PCP: [name], MD  Patient is from: Home.  Lives with his wife.  DOA: 07/09/2022 LOS: 1  Chief complaints Chief Complaint  Patient presents with   Fall   Hip Pain     Brief Narrative / Interim history: 88 year old M with PMH of CAD/remote stent, GERD, HTN, PVD and HLD presenting with mechanical fall and subsequent severe right hip pain and inability to bear weight, and admitted for impacted subcapital femoral neck fracture.  Orthopedic surgery consulted.  Plan for surgical repair on 07/11/2022.     Subjective: Seen and examined earlier this morning.  No major events overnight of this morning.  Pain fairly controlled with pain medication.  He denies chest pain, palpitation, shortness of breath, GI or UTI symptoms.  Objective: Vitals:   07/10/22 1222 07/10/22 1227 07/10/22 1232 07/10/22 1355  BP: (!) 174/76 (!) 176/59 (!) 165/59 (!) 163/66  Pulse: 65 67 66 67  Resp: '16 16 16 17  '$ Temp:    97.9 F (36.6 C)  TempSrc:    Oral  SpO2: 96%   92%  Weight:      Height:        Examination:  GENERAL: No apparent distress.  Nontoxic. HEENT: MMM.  Vision and hearing grossly intact.  NECK: Supple.  No apparent JVD.  RESP:  No IWOB.  Fair aeration bilaterally. CVS:  RRR. Heart sounds normal.  ABD/GI/GU: BS+. Abd soft, NTND.  MSK/EXT: TTP over right hip.  Not able to move RLE due to pain. SKIN: no apparent skin lesion or wound NEURO: Awake, alert and oriented appropriately.  No apparent focal neuro deficit. PSYCH: Calm. Normal affect.   Procedures:  None  Microbiology summarized: MRSA PCR screen negative.  Assessment and plan: Principal Problem:   Intertrochanteric fracture of right femur (HCC) Active Problems:   Essential hypertension   Coronary atherosclerosis   GERD  Accidental fall at home Closed impacted subcapital femoral neck fracture involving the proximal right femur: Due to  fall. -Orthopedic surgery on board.  Plan for surgical repair on 07/11/2022. -Discontinue ibuprofen, HCTZ, Toradol and morphine -IV Robaxin for muscle spasm. -Schedule Tylenol with as needed oxycodone and hydromorphone for pain control -Patient is stable from cardiovascular standpoint.  No exertional chest pain, dyspnea or arrhythmia -Continue holding Plavix.   History of CAD/remote stent: Stable.  Cardiopulmonary symptoms.  No prodromes leading to fall. -Continue home medications -Hold Plavix pending surgery.  Essential hypertension: BP slightly elevated.  Partly due to pain. -Pain control as above -Continue home losartan, atenolol, amlodipine -Labetalol as needed.  GERD -PPI  BPH without LUTS -Continue home meds.   Body mass index is 27.98 kg/m.   Nutrition Problem: Increased nutrient needs Etiology: other (see comment) (femur fracture) Signs/Symptoms: estimated needs Interventions: Boost Plus   DVT prophylaxis:  heparin injection 5,000 Units Start: 07/09/22 2200  Code Status: Full code Family Communication: None at bedside Level of care: Med-Surg Status is: Inpatient Remains inpatient appropriate because: Right femoral neck fracture pending repair.   Final disposition: TBD Consultants:  Orthopedic surgery  Sch Meds:  Scheduled Meds:  acetaminophen  650 mg Oral QID   amLODipine  10 mg Oral Daily   aspirin EC  81 mg Oral Daily   atenolol  50 mg Oral Daily   atorvastatin  40 mg Oral q1800   feeding supplement  1 Container Oral Q24H   finasteride  5 mg Oral Daily   gabapentin  100 mg Oral TID   heparin  5,000 Units Subcutaneous Q8H   iron polysaccharides  150 mg Oral Q M,W,F   losartan  50 mg Oral Daily   pantoprazole  40 mg Oral QHS   potassium chloride SA  20 mEq Oral QPM   senna  1 tablet Oral BID   tamsulosin  0.4 mg Oral QPC supper   Continuous Infusions:  sodium chloride 50 mL/hr at 07/10/22 1106   methocarbamol (ROBAXIN) IV     PRN  Meds:.diazepam, HYDROmorphone (DILAUDID) injection, labetalol, methocarbamol (ROBAXIN) IV, oxyCODONE, zolpidem  Antimicrobials: Anti-infectives (From admission, onward)    None        I have personally reviewed the following labs and images: CBC: Recent Labs  Lab 07/09/22 1450  WBC 9.0  NEUTROABS 7.3  HGB 12.6*  HCT 38.5*  MCV 87.1  PLT 257   BMP &GFR Recent Labs  Lab 07/09/22 1450  NA 141  K 3.8  CL 108  CO2 25  GLUCOSE 129*  BUN 15  CREATININE 0.96  CALCIUM 9.0   Estimated Creatinine Clearance: 48 mL/min (by C-G formula based on SCr of 0.96 mg/dL). Liver & Pancreas: No results for input(s): "AST", "ALT", "ALKPHOS", "BILITOT", "PROT", "ALBUMIN" in the last 168 hours. No results for input(s): "LIPASE", "AMYLASE" in the last 168 hours. No results for input(s): "AMMONIA" in the last 168 hours. Diabetic: No results for input(s): "HGBA1C" in the last 72 hours. No results for input(s): "GLUCAP" in the last 168 hours. Cardiac Enzymes: No results for input(s): "CKTOTAL", "CKMB", "CKMBINDEX", "TROPONINI" in the last 168 hours. No results for input(s): "PROBNP" in the last 8760 hours. Coagulation Profile: Recent Labs  Lab 07/09/22 1450  INR 0.9   Thyroid Function Tests: No results for input(s): "TSH", "T4TOTAL", "FREET4", "T3FREE", "THYROIDAB" in the last 72 hours. Lipid Profile: No results for input(s): "CHOL", "HDL", "LDLCALC", "TRIG", "CHOLHDL", "LDLDIRECT" in the last 72 hours. Anemia Panel: No results for input(s): "VITAMINB12", "FOLATE", "FERRITIN", "TIBC", "IRON", "RETICCTPCT" in the last 72 hours. Urine analysis:    Component Value Date/Time   COLORURINE YELLOW 05/14/2022 0846   APPEARANCEUR CLEAR 05/14/2022 0846   LABSPEC 1.019 05/14/2022 0846   PHURINE 5.0 05/14/2022 0846   GLUCOSEU NEGATIVE 05/14/2022 0846   HGBUR NEGATIVE 05/14/2022 0846   BILIRUBINUR NEGATIVE 05/14/2022 0846   KETONESUR 5 (A) 05/14/2022 0846   PROTEINUR NEGATIVE 05/14/2022  0846   UROBILINOGEN 0.2 10/28/2010 0909   NITRITE NEGATIVE 05/14/2022 0846   LEUKOCYTESUR NEGATIVE 05/14/2022 0846   Sepsis Labs: Invalid input(s): "PROCALCITONIN", "LACTICIDVEN"  Microbiology: Recent Results (from the past 240 hour(s))  Surgical PCR screen     Status: None   Collection Time: 07/10/22  9:20 AM   Specimen: Nasal Mucosa; Nasal Swab  Result Value Ref Range Status   MRSA, PCR NEGATIVE NEGATIVE Final   Staphylococcus aureus NEGATIVE NEGATIVE Final    Comment: (NOTE) The Xpert SA Assay (FDA approved for NASAL specimens in patients 72 years of age and older), is one component of a comprehensive surveillance program. It is not intended to diagnose infection nor to guide or monitor treatment. Performed at [facility], [place] [address] [place], [place] [address]     Radiology Studies: CT HEAD WO CONTRAST  Result Date: 07/09/2022 CLINICAL DATA:  [place] EXAM: CT HEAD WITHOUT CONTRAST TECHNIQUE: Contiguous axial images were obtained from the base of the skull through the vertex without intravenous contrast. RADIATION DOSE REDUCTION: This exam was performed according to the departmental  dose-optimization program which includes automated exposure control, adjustment of the mA and/or kV according to patient size and/or use of iterative reconstruction technique. COMPARISON:  05/02/2022 FINDINGS: Brain: Stable confluent hypodensities throughout the periventricular white matter consistent with chronic small vessel ischemic changes. Chronic lacunar infarcts are again seen within the basal ganglia. No signs of acute infarct or hemorrhage. Lateral ventricles and remaining midline structures are unremarkable. No acute extra-axial fluid collections. No mass effect. Vascular: No hyperdense vessel or unexpected calcification. Skull: Small right parietal scalp hematoma. No underlying fracture. The remainder of the calvarium is unremarkable. Sinuses/Orbits: No acute finding.  Other: None. IMPRESSION: 1. Small right parietal scalp hematoma.  No underlying fracture. 2. No acute intracranial process. Electronically Signed   By: Randa Ngo M.D.   On: 07/09/2022 15:30   DG Hip Unilat  With Pelvis 2-3 Views Right  Result Date: 07/09/2022 CLINICAL DATA:  Right hip pain after fall. EXAM: DG HIP (WITH OR WITHOUT PELVIS) 2-3V RIGHT COMPARISON:  None Available. FINDINGS: Signs of impacted subcapital femoral neck fracture identified involving the proximal right femur. Left hip appears intact. Mild bilateral hip osteoarthritis. Atherosclerotic calcifications identified. IMPRESSION: Impacted subcapital femoral neck fracture involving the proximal right femur. Electronically Signed   By: Kerby Moors M.D.   On: 07/09/2022 15:26   DG Chest 1 View  Result Date: 07/09/2022 CLINICAL DATA:  Status post mechanical fall with right hip pain. For preoperative evaluation. EXAM: CHEST  1 VIEW COMPARISON:  Chest radiograph dated 05/13/2022 FINDINGS: Normal lung volumes. No focal consolidations. Bibasilar linear opacities. No pleural effusion or pneumothorax. Similar cardiomediastinal silhouette. The visualized skeletal structures are unremarkable. IMPRESSION: 1. No acute cardiopulmonary process. 2. Bibasilar atelectasis. Electronically Signed   By: Darrin Nipper M.D.   On: 07/09/2022 15:23      Lekha Dancer T. Pukalani  If 7PM-7AM, please contact night-coverage [email or web address] 07/10/2022, 2:19 PM

## 2022-07-11 ENCOUNTER — Encounter (HOSPITAL_COMMUNITY)
Admission: EM | Disposition: A | Discharge status: Skilled Nursing Facility | Payer: Self-pay | Source: Skilled Nursing Facility | Attending: Student

## 2022-07-11 ENCOUNTER — Inpatient Hospital Stay (HOSPITAL_COMMUNITY): Payer: Medicare Other

## 2022-07-11 ENCOUNTER — Other Ambulatory Visit: Payer: Self-pay

## 2022-07-11 ENCOUNTER — Inpatient Hospital Stay (HOSPITAL_COMMUNITY): Payer: Medicare Other | Admitting: Registered Nurse

## 2022-07-11 ENCOUNTER — Encounter (HOSPITAL_COMMUNITY): Payer: Self-pay | Admitting: Internal Medicine

## 2022-07-11 DIAGNOSIS — I10 Essential (primary) hypertension: Secondary | ICD-10-CM

## 2022-07-11 DIAGNOSIS — Z87891 Personal history of nicotine dependence: Secondary | ICD-10-CM

## 2022-07-11 DIAGNOSIS — I2510 Atherosclerotic heart disease of native coronary artery without angina pectoris: Secondary | ICD-10-CM

## 2022-07-11 DIAGNOSIS — K219 Gastro-esophageal reflux disease without esophagitis: Secondary | ICD-10-CM | POA: Diagnosis not present

## 2022-07-11 DIAGNOSIS — S72001A Fracture of unspecified part of neck of right femur, initial encounter for closed fracture: Secondary | ICD-10-CM

## 2022-07-11 DIAGNOSIS — S72144A Nondisplaced intertrochanteric fracture of right femur, initial encounter for closed fracture: Secondary | ICD-10-CM | POA: Diagnosis not present

## 2022-07-11 DIAGNOSIS — W19XXXA Unspecified fall, initial encounter: Secondary | ICD-10-CM

## 2022-07-11 DIAGNOSIS — R410 Disorientation, unspecified: Secondary | ICD-10-CM

## 2022-07-11 HISTORY — PX: TOTAL HIP ARTHROPLASTY: SHX124

## 2022-07-11 LAB — RENAL FUNCTION PANEL
Albumin: 3.3 g/dL — ABNORMAL LOW (ref 3.5–5.0)
Anion gap: 8 (ref 5–15)
BUN: 16 mg/dL (ref 8–23)
CO2: 26 mmol/L (ref 22–32)
Calcium: 8.7 mg/dL — ABNORMAL LOW (ref 8.9–10.3)
Chloride: 108 mmol/L (ref 98–111)
Creatinine, Ser: 0.84 mg/dL (ref 0.61–1.24)
GFR, Estimated: >60 mL/min (ref >60)
Glucose, Bld: 129 mg/dL — ABNORMAL HIGH (ref 70–99)
Phosphorus: 3.3 mg/dL (ref 2.5–4.6)
Potassium: 3.8 mmol/L (ref 3.5–5.1)
Sodium: 142 mmol/L (ref 135–145)

## 2022-07-11 LAB — CBC
HCT: 37.6 % — ABNORMAL LOW (ref 39.0–52.0)
Hemoglobin: 12.2 g/dL — ABNORMAL LOW (ref 13.0–17.0)
MCH: 28.6 pg (ref 26.0–34.0)
MCHC: 32.4 g/dL (ref 30.0–36.0)
MCV: 88.3 fL (ref 80.0–100.0)
Platelets: 202 10*3/uL (ref 150–400)
RBC: 4.26 MIL/uL (ref 4.22–5.81)
RDW: 13.1 % (ref 11.5–15.5)
WBC: 10.2 10*3/uL (ref 4.0–10.5)
nRBC: 0 % (ref 0.0–0.2)

## 2022-07-11 LAB — MAGNESIUM: Magnesium: 2.1 mg/dL (ref 1.7–2.4)

## 2022-07-11 SURGERY — TOTAL HIP ARTHROPLASTY ANTERIOR APPROACH
Anesthesia: General | Site: Hip | Laterality: Right

## 2022-07-11 MED ORDER — MENTHOL 3 MG MT LOZG: 1.0000 | LOZENGE | OROMUCOSAL | Status: DC | PRN

## 2022-07-11 MED ORDER — LACTATED RINGERS IV SOLN: INTRAVENOUS | Status: DC

## 2022-07-11 MED ORDER — STERILE WATER FOR IRRIGATION IR SOLN
Status: DC | PRN
  Administered 2022-07-11: 2000 mL

## 2022-07-11 MED ORDER — PHENYLEPHRINE HCL-NACL 20-0.9 MG/250ML-% IV SOLN
INTRAVENOUS | Status: DC | PRN
  Administered 2022-07-11: 50 ug/min via INTRAVENOUS

## 2022-07-11 MED ORDER — DEXAMETHASONE SODIUM PHOSPHATE 10 MG/ML IJ SOLN
10.0000 mg | Freq: Once | INTRAMUSCULAR | Status: AC
  Administered 2022-07-12: 10 mg via INTRAVENOUS
  Filled 2022-07-11: qty 1

## 2022-07-11 MED ORDER — DIPHENHYDRAMINE HCL 12.5 MG/5ML PO ELIX: 12.5000 mg | ORAL_SOLUTION | ORAL | Status: DC | PRN

## 2022-07-11 MED ORDER — POVIDONE-IODINE 10 % EX SWAB: 2.0000 | Freq: Once | CUTANEOUS | Status: DC

## 2022-07-11 MED ORDER — ONDANSETRON HCL 4 MG/2ML IJ SOLN
INTRAMUSCULAR | Status: AC
  Filled 2022-07-11: qty 2

## 2022-07-11 MED ORDER — SODIUM CHLORIDE (PF) 0.9 % IJ SOLN
INTRAMUSCULAR | Status: DC | PRN
  Administered 2022-07-11: 30 mL

## 2022-07-11 MED ORDER — 0.9 % SODIUM CHLORIDE (POUR BTL) OPTIME
TOPICAL | Status: DC | PRN
  Administered 2022-07-11: 1000 mL

## 2022-07-11 MED ORDER — ASPIRIN 81 MG PO CHEW: 81.0000 mg | CHEWABLE_TABLET | Freq: Two times a day (BID) | ORAL | Status: DC

## 2022-07-11 MED ORDER — TRANEXAMIC ACID-NACL 1000-0.7 MG/100ML-% IV SOLN
1000.0000 mg | INTRAVENOUS | Status: AC
  Administered 2022-07-11: 1000 mg via INTRAVENOUS
  Filled 2022-07-11: qty 100

## 2022-07-11 MED ORDER — PHENOL 1.4 % MT LIQD: 1.0000 | OROMUCOSAL | Status: DC | PRN

## 2022-07-11 MED ORDER — DEXAMETHASONE SODIUM PHOSPHATE 10 MG/ML IJ SOLN
INTRAMUSCULAR | Status: AC
  Filled 2022-07-11: qty 1

## 2022-07-11 MED ORDER — ACETAMINOPHEN 500 MG PO TABS
1000.0000 mg | ORAL_TABLET | Freq: Four times a day (QID) | ORAL | Status: DC
  Administered 2022-07-11 – 2022-07-13 (×7): 1000 mg via ORAL
  Filled 2022-07-11 (×8): qty 2

## 2022-07-11 MED ORDER — PHENYLEPHRINE 80 MCG/ML (10ML) SYRINGE FOR IV PUSH (FOR BLOOD PRESSURE SUPPORT)
PREFILLED_SYRINGE | INTRAVENOUS | Status: AC
  Filled 2022-07-11: qty 10

## 2022-07-11 MED ORDER — FENTANYL CITRATE PF 50 MCG/ML IJ SOSY: 50.0000 ug | PREFILLED_SYRINGE | INTRAMUSCULAR | Status: DC

## 2022-07-11 MED ORDER — ROCURONIUM BROMIDE 10 MG/ML (PF) SYRINGE
PREFILLED_SYRINGE | INTRAVENOUS | Status: DC | PRN
  Administered 2022-07-11: 60 mg via INTRAVENOUS

## 2022-07-11 MED ORDER — FENTANYL CITRATE (PF) 250 MCG/5ML IJ SOLN
INTRAMUSCULAR | Status: DC | PRN
  Administered 2022-07-11 (×2): 50 ug via INTRAVENOUS

## 2022-07-11 MED ORDER — BUPIVACAINE HCL 0.25 % IJ SOLN
INTRAMUSCULAR | Status: AC
  Filled 2022-07-11: qty 1

## 2022-07-11 MED ORDER — SODIUM CHLORIDE 0.9 % IV SOLN: INTRAVENOUS | Status: DC

## 2022-07-11 MED ORDER — KETOROLAC TROMETHAMINE 30 MG/ML IJ SOLN
INTRAMUSCULAR | Status: AC
  Filled 2022-07-11: qty 1

## 2022-07-11 MED ORDER — DOCUSATE SODIUM 100 MG PO CAPS
100.0000 mg | ORAL_CAPSULE | Freq: Two times a day (BID) | ORAL | Status: DC
  Administered 2022-07-11 – 2022-07-13 (×4): 100 mg via ORAL
  Filled 2022-07-11 (×4): qty 1

## 2022-07-11 MED ORDER — BISACODYL 10 MG RE SUPP: 10.0000 mg | Freq: Every day | RECTAL | Status: DC | PRN

## 2022-07-11 MED ORDER — METOCLOPRAMIDE HCL 5 MG PO TABS: 5.0000 mg | ORAL_TABLET | Freq: Three times a day (TID) | ORAL | Status: DC | PRN

## 2022-07-11 MED ORDER — HYDROMORPHONE HCL 1 MG/ML IJ SOLN: 0.5000 mg | INTRAMUSCULAR | Status: DC | PRN

## 2022-07-11 MED ORDER — POLYETHYLENE GLYCOL 3350 17 G PO PACK
17.0000 g | PACK | Freq: Two times a day (BID) | ORAL | Status: DC
  Administered 2022-07-11 – 2022-07-13 (×4): 17 g via ORAL
  Filled 2022-07-11 (×4): qty 1

## 2022-07-11 MED ORDER — KETOROLAC TROMETHAMINE 30 MG/ML IJ SOLN
INTRAMUSCULAR | Status: DC | PRN
  Administered 2022-07-11: 30 mg

## 2022-07-11 MED ORDER — CEFAZOLIN SODIUM-DEXTROSE 2-4 GM/100ML-% IV SOLN
2.0000 g | Freq: Four times a day (QID) | INTRAVENOUS | Status: AC
  Administered 2022-07-11 – 2022-07-12 (×2): 2 g via INTRAVENOUS
  Filled 2022-07-11 (×2): qty 100

## 2022-07-11 MED ORDER — ONDANSETRON HCL 4 MG PO TABS: 4.0000 mg | ORAL_TABLET | Freq: Four times a day (QID) | ORAL | Status: DC | PRN

## 2022-07-11 MED ORDER — PHENYLEPHRINE 80 MCG/ML (10ML) SYRINGE FOR IV PUSH (FOR BLOOD PRESSURE SUPPORT)
PREFILLED_SYRINGE | INTRAVENOUS | Status: DC | PRN
  Administered 2022-07-11 (×2): 160 ug via INTRAVENOUS
  Administered 2022-07-11: 120 ug via INTRAVENOUS

## 2022-07-11 MED ORDER — FENTANYL CITRATE PF 50 MCG/ML IJ SOSY: 25.0000 ug | PREFILLED_SYRINGE | INTRAMUSCULAR | Status: DC | PRN

## 2022-07-11 MED ORDER — POVIDONE-IODINE 10 % EX SWAB
2.0000 | Freq: Once | CUTANEOUS | Status: AC
  Administered 2022-07-11: 2 via TOPICAL

## 2022-07-11 MED ORDER — SUGAMMADEX SODIUM 200 MG/2ML IV SOLN
INTRAVENOUS | Status: DC | PRN
  Administered 2022-07-11: 200 mg via INTRAVENOUS

## 2022-07-11 MED ORDER — TRANEXAMIC ACID-NACL 1000-0.7 MG/100ML-% IV SOLN
1000.0000 mg | Freq: Once | INTRAVENOUS | Status: AC
  Administered 2022-07-11: 1000 mg via INTRAVENOUS
  Filled 2022-07-11: qty 100

## 2022-07-11 MED ORDER — ONDANSETRON HCL 4 MG/2ML IJ SOLN: 4.0000 mg | Freq: Four times a day (QID) | INTRAMUSCULAR | Status: DC | PRN

## 2022-07-11 MED ORDER — ONDANSETRON HCL 4 MG/2ML IJ SOLN
INTRAMUSCULAR | Status: DC | PRN
  Administered 2022-07-11: 4 mg via INTRAVENOUS

## 2022-07-11 MED ORDER — FENTANYL CITRATE (PF) 250 MCG/5ML IJ SOLN
INTRAMUSCULAR | Status: AC
  Filled 2022-07-11: qty 5

## 2022-07-11 MED ORDER — ONDANSETRON HCL 4 MG/2ML IJ SOLN: 4.0000 mg | Freq: Once | INTRAMUSCULAR | Status: DC | PRN

## 2022-07-11 MED ORDER — PROPOFOL 1000 MG/100ML IV EMUL
INTRAVENOUS | Status: AC
  Filled 2022-07-11: qty 100

## 2022-07-11 MED ORDER — METHOCARBAMOL 500 MG IVPB - SIMPLE MED: 500.0000 mg | Freq: Four times a day (QID) | INTRAVENOUS | Status: DC | PRN

## 2022-07-11 MED ORDER — ROCURONIUM BROMIDE 10 MG/ML (PF) SYRINGE
PREFILLED_SYRINGE | INTRAVENOUS | Status: AC
  Filled 2022-07-11: qty 10

## 2022-07-11 MED ORDER — SODIUM CHLORIDE (PF) 0.9 % IJ SOLN
INTRAMUSCULAR | Status: AC
  Filled 2022-07-11: qty 50

## 2022-07-11 MED ORDER — VASOPRESSIN 20 UNIT/ML IV SOLN
INTRAVENOUS | Status: AC
  Filled 2022-07-11: qty 1

## 2022-07-11 MED ORDER — CHLORHEXIDINE GLUCONATE 4 % EX LIQD: 60.0000 mL | Freq: Once | CUTANEOUS | Status: DC

## 2022-07-11 MED ORDER — OXYCODONE HCL 5 MG PO TABS: 10.0000 mg | ORAL_TABLET | ORAL | Status: DC | PRN

## 2022-07-11 MED ORDER — CEFAZOLIN SODIUM-DEXTROSE 2-4 GM/100ML-% IV SOLN
2.0000 g | INTRAVENOUS | Status: AC
  Administered 2022-07-11: 2 g via INTRAVENOUS
  Filled 2022-07-11: qty 100

## 2022-07-11 MED ORDER — PROPOFOL 10 MG/ML IV BOLUS
INTRAVENOUS | Status: DC | PRN
  Administered 2022-07-11: 50 mg via INTRAVENOUS
  Administered 2022-07-11: 60 mg via INTRAVENOUS

## 2022-07-11 MED ORDER — DEXAMETHASONE SODIUM PHOSPHATE 10 MG/ML IJ SOLN
INTRAMUSCULAR | Status: DC | PRN
  Administered 2022-07-11: 10 mg via INTRAVENOUS

## 2022-07-11 MED ORDER — METOCLOPRAMIDE HCL 5 MG/ML IJ SOLN: 5.0000 mg | Freq: Three times a day (TID) | INTRAMUSCULAR | Status: DC | PRN

## 2022-07-11 MED ORDER — METHOCARBAMOL 500 MG PO TABS
500.0000 mg | ORAL_TABLET | Freq: Four times a day (QID) | ORAL | Status: DC | PRN
  Administered 2022-07-11 – 2022-07-12 (×2): 500 mg via ORAL
  Filled 2022-07-11 (×2): qty 1

## 2022-07-11 MED ORDER — BUPIVACAINE HCL (PF) 0.25 % IJ SOLN
INTRAMUSCULAR | Status: DC | PRN
  Administered 2022-07-11: 30 mL

## 2022-07-11 MED ORDER — OXYCODONE HCL 5 MG PO TABS
5.0000 mg | ORAL_TABLET | ORAL | Status: DC | PRN
  Administered 2022-07-12: 5 mg via ORAL
  Filled 2022-07-11: qty 1

## 2022-07-11 SURGICAL SUPPLY — 39 items
ADH SKN CLS APL DERMABOND .7 (GAUZE/BANDAGES/DRESSINGS) ×1
ARTICULEZE HEAD (Hips) ×1 IMPLANT
BAG COUNTER SPONGE SURGICOUNT (BAG) ×1
BAG DECANTER FOR FLEXI CONT (MISCELLANEOUS)
BAG SPEC THK2 15X12 ZIP CLS (MISCELLANEOUS)
BAG SPNG CNTER NS LX DISP (BAG) ×1
BAG ZIPLOCK 12X15 (MISCELLANEOUS)
BLADE SAG 18X100X1.27 (BLADE) ×1
COVER PERINEAL POST (MISCELLANEOUS) ×1
COVER SURGICAL LIGHT HANDLE (MISCELLANEOUS) ×1
DERMABOND ADVANCED .7 DNX12 (GAUZE/BANDAGES/DRESSINGS) ×1
DRAPE FOOT SWITCH (DRAPES) ×1
DRAPE STERI IOBAN 125X83 (DRAPES) ×1
DRAPE U-SHAPE 47X51 STRL (DRAPES) ×2
DRSG AQUACEL AG ADV 3.5X10 (GAUZE/BANDAGES/DRESSINGS) ×1
DRSG AQUACEL AG SP 3.5X10 (GAUZE/BANDAGES/DRESSINGS) ×1
DURAPREP 26ML APPLICATOR (WOUND CARE) ×1
ELECT REM PT RETURN 15FT ADLT (MISCELLANEOUS) ×1
GLOVE BIO SURGEON STRL SZ 6 (GLOVE) ×1
GLOVE BIOGEL PI IND STRL 6.5 (GLOVE) ×1
GLOVE BIOGEL PI IND STRL 7.5 (GLOVE) ×1
GLOVE ORTHO TXT STRL SZ7.5 (GLOVE) ×2
GOWN STRL REUS W/ TWL LRG LVL3 (GOWN DISPOSABLE) ×2
GOWN STRL REUS W/TWL LRG LVL3 (GOWN DISPOSABLE) ×2
HOLDER FOLEY CATH W/STRAP (MISCELLANEOUS) ×1
KIT TURNOVER KIT A (KITS) ×1
PACK ANTERIOR HIP CUSTOM (KITS) ×1
PINNACLE ALTRX PLUS 4 N 36X56 (Hips) ×1 IMPLANT
PINNACLE SECTOR CUP 56MM (Hips) ×1 IMPLANT
SCREW 6.5MMX35MM (Screw) ×1 IMPLANT
STEM FEM ACTIS HIGH SZ8 (Stem) ×1 IMPLANT
SUT MNCRL AB 4-0 PS2 18 (SUTURE) ×1
SUT STRATAFIX 0 PDS 27 VIOLET (SUTURE) ×1
SUT VIC AB 1 CT1 36 (SUTURE) ×3
SUT VIC AB 2-0 CT1 27 (SUTURE) ×2
SUT VIC AB 2-0 CT1 TAPERPNT 27 (SUTURE) ×2
TRAY FOLEY MTR SLVR 16FR STAT (SET/KITS/TRAYS/PACK)
TUBE SUCTION HIGH CAP CLEAR NV (SUCTIONS) ×1
WATER STERILE IRR 1000ML POUR (IV SOLUTION) ×1

## 2022-07-11 NOTE — Discharge Instructions (Signed)

## 2022-07-11 NOTE — Progress Notes (Signed)
Patient has been transported down to the O.R. for surgery. Report given to [place], RN.

## 2022-07-11 NOTE — Interval H&P Note (Signed)
History and Physical Interval Note:  07/11/2022 12:09 PM  Nathan Santiago [facility]  has presented today for surgery, with the diagnosis of right hip fracture.  The various methods of treatment have been discussed with the patient and family. After consideration of risks, benefits and other options for treatment, the patient has consented to  Procedure(s): TOTAL HIP ARTHROPLASTY ANTERIOR APPROACH (Right) as a surgical intervention.  The patient's history has been reviewed, patient examined, no change in status, stable for surgery.  I have reviewed the patient's chart and labs.  Questions were answered to the patient's satisfaction.     Mauri Pole

## 2022-07-11 NOTE — Progress Notes (Signed)
PROGRESS NOTE  Nathan Santiago [facility] BSJ:[id number] DOB: 05/24/1931   PCP: [name], MD  Patient is from: Home.  Lives with his wife.  DOA: 07/09/2022 LOS: 2  Chief complaints Chief Complaint  Patient presents with   Fall   Hip Pain     Brief Narrative / Interim history: 88 year old M with PMH of CAD/remote stent, GERD, HTN, PVD and HLD presenting with mechanical fall and subsequent severe right hip pain and inability to bear weight, and admitted for impacted subcapital femoral neck fracture.  Orthopedic surgery consulted.   Patient underwent right THA by Dr. Alvan Dame on 07/11/2022.  EBL about 300 cc.   Subjective: Seen and examined earlier this morning before he went for surgery.  No major events overnight.  He is confused and somewhat delirious but not agitated.  He is oriented to self and situation but not place or time.  Objective: Vitals:   07/10/22 1355 07/10/22 2100 07/11/22 0609 07/11/22 1217  BP: (!) 163/66 (!) 164/54 (!) 164/52   Pulse: 67 69 74   Resp: '17 17 17   '$ Temp: 97.9 F (36.6 C) 98.4 F (36.9 C) 98.6 F (37 C)   TempSrc: Oral  Oral   SpO2: 92% 93% 94%   Weight:    73.9 kg  Height:        Examination:   GENERAL: No apparent distress.  Nontoxic. HEENT: MMM.  Vision and hearing grossly intact.  NECK: Supple.  No apparent JVD.  RESP:  No IWOB.  Fair aeration bilaterally. CVS:  RRR. Heart sounds normal.  ABD/GI/GU: BS+. Abd soft, NTND.  MSK/EXT: TTP over right hip.  Not able to move RLE due to pain. SKIN: no apparent skin lesion or wound NEURO: Awake and alert. Oriented to self but not place or time.  Follows commands.  No apparent focal neuro deficit. PSYCH: Calm.  No agitation.  Procedures:  07/11/2022-right THA by Dr. Alvan Dame  Microbiology summarized: MRSA PCR screen negative.  Assessment and plan: Principal Problem:   Intertrochanteric fracture of right femur (HCC) Active Problems:   Essential hypertension   History of CAD (coronary artery  disease)   GERD   Preoperative clearance   Accidental fall  Accidental fall at home Closed impacted subcapital femoral neck fracture involving the proximal right femur: Due to fall. -S/p right THA by Dr. Alvan Dame -Pain control and DVT prophylaxis per orthopedic surgery -PT/OT   History of CAD/remote stent: Stable.  Cardiopulmonary symptoms.  No prodromes leading to fall. -Continue home medications -Hold Plavix pending surgery.  Essential hypertension: BP slightly elevated.  Partly due to pain. -Pain control as above -Continue home losartan, atenolol, amlodipine -Labetalol as needed.  Delirium: Likely from pain medication.  He is disoriented but not agitated. -Delirium and fall precaution -Minimize sedating medications  GERD -PPI  BPH without LUTS -Continue home meds.   Body mass index is 27.98 kg/m.   Nutrition Problem: Increased nutrient needs Etiology: other (see comment) (femur fracture) Signs/Symptoms: estimated needs Interventions: Boost Plus   DVT prophylaxis:  heparin injection 5,000 Units Start: 07/09/22 2200  Code Status: Full code Family Communication: None at bedside Level of care: Med-Surg Status is: Inpatient Remains inpatient appropriate because: Right femoral neck fracture   Final disposition: TBD Consultants:  Orthopedic surgery  Sch Meds:  Scheduled Meds:  [MAR Hold] acetaminophen  650 mg Oral QID   [MAR Hold] amLODipine  10 mg Oral Daily   [MAR Hold] aspirin EC  81 mg Oral Daily   [MAR Hold]  atenolol  50 mg Oral Daily   [MAR Hold] atorvastatin  40 mg Oral q1800   chlorhexidine  60 mL Topical Once   [MAR Hold] feeding supplement  1 Container Oral Q24H   [MAR Hold] finasteride  5 mg Oral Daily   [MAR Hold] gabapentin  100 mg Oral TID   [MAR Hold] heparin  5,000 Units Subcutaneous Q8H   [MAR Hold] iron polysaccharides  150 mg Oral Q M,W,F   [MAR Hold] losartan  50 mg Oral Daily   [MAR Hold] pantoprazole  40 mg Oral QHS   [MAR Hold]  potassium chloride SA  20 mEq Oral QPM   povidone-iodine  2 Application Topical Once   [MAR Hold] senna  1 tablet Oral BID   [MAR Hold] tamsulosin  0.4 mg Oral QPC supper   Continuous Infusions:  sodium chloride 50 mL/hr at 07/10/22 1643   sodium chloride     lactated ringers 75 mL/hr at 07/11/22 1319   [MAR Hold] methocarbamol (ROBAXIN) IV     methocarbamol (ROBAXIN) IV     PRN Meds:.0.9 % irrigation (POUR BTL), bupivacaine (PF), [MAR Hold] diazepam, fentaNYL (SUBLIMAZE) injection, [MAR Hold]  HYDROmorphone (DILAUDID) injection, ketorolac, [MAR Hold] labetalol, [MAR Hold] methocarbamol (ROBAXIN) IV, methocarbamol **OR** methocarbamol (ROBAXIN) IV, ondansetron (ZOFRAN) IV, [MAR Hold] oxyCODONE, sodium chloride (PF), sterile water for irrigation, [MAR Hold] zolpidem  Antimicrobials: Anti-infectives (From admission, onward)    Start     Dose/Rate Route Frequency Ordered Stop   07/11/22 1200  ceFAZolin (ANCEF) IVPB 2g/100 mL premix        2 g 200 mL/hr over 30 Minutes Intravenous On call to O.R. 07/11/22 0853 07/11/22 1346        I have personally reviewed the following labs and images: CBC: Recent Labs  Lab 07/09/22 1450 07/11/22 0334  WBC 9.0 10.2  NEUTROABS 7.3  --   HGB 12.6* 12.2*  HCT 38.5* 37.6*  MCV 87.1 88.3  PLT 257 202   BMP &GFR Recent Labs  Lab 07/09/22 1450 07/11/22 0334  NA 141 142  K 3.8 3.8  CL 108 108  CO2 25 26  GLUCOSE 129* 129*  BUN 15 16  CREATININE 0.96 0.84  CALCIUM 9.0 8.7*  MG  --  2.1  PHOS  --  3.3   Estimated Creatinine Clearance: 54.9 mL/min (by C-G formula based on SCr of 0.84 mg/dL). Liver & Pancreas: Recent Labs  Lab 07/11/22 0334  ALBUMIN 3.3*   No results for input(s): "LIPASE", "AMYLASE" in the last 168 hours. No results for input(s): "AMMONIA" in the last 168 hours. Diabetic: No results for input(s): "HGBA1C" in the last 72 hours. No results for input(s): "GLUCAP" in the last 168 hours. Cardiac Enzymes: No results  for input(s): "CKTOTAL", "CKMB", "CKMBINDEX", "TROPONINI" in the last 168 hours. No results for input(s): "PROBNP" in the last 8760 hours. Coagulation Profile: Recent Labs  Lab 07/09/22 1450  INR 0.9   Thyroid Function Tests: No results for input(s): "TSH", "T4TOTAL", "FREET4", "T3FREE", "THYROIDAB" in the last 72 hours. Lipid Profile: No results for input(s): "CHOL", "HDL", "LDLCALC", "TRIG", "CHOLHDL", "LDLDIRECT" in the last 72 hours. Anemia Panel: No results for input(s): "VITAMINB12", "FOLATE", "FERRITIN", "TIBC", "IRON", "RETICCTPCT" in the last 72 hours. Urine analysis:    Component Value Date/Time   COLORURINE YELLOW 05/14/2022 [place] 05/14/2022 0846   LABSPEC 1.019 05/14/2022 0846   PHURINE 5.0 05/14/2022 [place] 05/14/2022 0846   HGBUR NEGATIVE 05/14/2022 5784  BILIRUBINUR NEGATIVE 05/14/2022 0846   KETONESUR 5 (A) 05/14/2022 0846   PROTEINUR NEGATIVE 05/14/2022 0846   UROBILINOGEN 0.2 10/28/2010 0909   NITRITE NEGATIVE 05/14/2022 0846   LEUKOCYTESUR NEGATIVE 05/14/2022 0846   Sepsis Labs: Invalid input(s): "PROCALCITONIN", "LACTICIDVEN"  Microbiology: Recent Results (from the past 240 hour(s))  Surgical PCR screen     Status: None   Collection Time: 07/10/22  9:20 AM   Specimen: Nasal Mucosa; Nasal Swab  Result Value Ref Range Status   MRSA, PCR NEGATIVE NEGATIVE Final   Staphylococcus aureus NEGATIVE NEGATIVE Final    Comment: (NOTE) The Xpert SA Assay (FDA approved for NASAL specimens in patients 61 years of age and older), is one component of a comprehensive surveillance program. It is not intended to diagnose infection nor to guide or monitor treatment. Performed at [facility], [place] [address], [place], [place] [address]     Radiology Studies: DG HIP UNILAT WITH PELVIS 1V RIGHT  Result Date: 07/11/2022 CLINICAL DATA:  Right hip replacement EXAM: DG HIP (WITH OR WITHOUT PELVIS) 1V RIGHT; DG  C-ARM 1-60 MIN-NO REPORT COMPARISON:  07/09/2022 FINDINGS: Seven C-arm images were obtained of the right hip. Right hip replacement in satisfactory position and alignment. No acute complication. IMPRESSION: Satisfactory right hip replacement. Electronically Signed   By: Franchot Gallo M.D.   On: 07/11/2022 14:54   DG C-Arm 1-60 Min-No Report  Result Date: 07/11/2022 Fluoroscopy was utilized by the requesting physician.  No radiographic interpretation.   DG C-Arm 1-60 Min-No Report  Result Date: 07/11/2022 Fluoroscopy was utilized by the requesting physician.  No radiographic interpretation.      Ghina Bittinger T. [place]  If 7PM-7AM, please contact night-coverage [email or web address] 07/11/2022, 3:09 PM

## 2022-07-11 NOTE — Brief Op Note (Incomplete)
07/09/2022 - 07/11/2022  1:39 PM  PATIENT:  [name] Mangrum  88 y.o. male  PRE-OPERATIVE DIAGNOSIS:  right hip fracture  POST-OPERATIVE DIAGNOSIS:  right hip fracture  PROCEDURE:  Procedure(s): TOTAL HIP ARTHROPLASTY ANTERIOR APPROACH (Right)  SURGEON:  Surgeon(s) and Role:    Paralee Cancel, MD - Primary  PHYSICIAN ASSISTANT:   ASSISTANTS: {ASSISTANTS:31801}   ANESTHESIA:   {procedures; anesthesia:812}  EBL:  {None/Minimal: 21241}   BLOOD ADMINISTERED:{BLOOD GIVEN TYPES AND AMOUNTS:20467}  DRAINS: {Devices; drains:31758}   LOCAL MEDICATIONS USED:  {LOCAL MEDICATIONS:10721995}  SPECIMEN:  {ONC STAGING; AJCC TYPE OF SPECIMEN:115600001}  DISPOSITION OF SPECIMEN:  {SPECIMEN DISPOSITION:204680}  COUNTS:  {OR COUNTS CORRECT/INCORRECT:204690}  TOURNIQUET:  * No tourniquets in log *  DICTATION: .{DICTATION UVOZD:6644034742}  PLAN OF CARE: {OPTIME PLAN OF VZDG:3875643329}  PATIENT DISPOSITION:  {op note disposition:31782}   Delay start of Pharmacological VTE agent (>24hrs) due to surgical blood loss or risk of bleeding: {YES/NO/NOT APPLICABLE:20182}

## 2022-07-11 NOTE — Progress Notes (Signed)
Patient ID: Nathan Santiago, male   DOB: [birth year], 88 y.o.   MRN: [id number] Subjective: Day of Surgery Procedure(s) (LRB): TOTAL HIP ARTHROPLASTY ANTERIOR APPROACH (Right)    Patient reports pain as moderate. Ready for OR today  Objective:   VITALS:   Vitals:   07/10/22 2100 07/11/22 0609  BP: (!) 164/54 (!) 164/52  Pulse: 69 74  Resp: 17 17  Temp: 98.4 F (36.9 C) 98.6 F (37 C)  SpO2: 93% 94%    Neurovascular intact  LABS Recent Labs    07/09/22 1450 07/11/22 0334  HGB 12.6* 12.2*  HCT 38.5* 37.6*  WBC 9.0 10.2  PLT 257 202    Recent Labs    07/09/22 1450 07/11/22 0334  NA 141 142  K 3.8 3.8  BUN 15 16  CREATININE 0.96 0.84  GLUCOSE 129* 129*    Recent Labs    07/09/22 1450  INR 0.9     Assessment/Plan: Day of Surgery Procedure(s) (LRB): TOTAL HIP ARTHROPLASTY ANTERIOR APPROACH (Right)   To OR today for right THR for management of his hip femoral neck fracture Post op orders to follow Disposition pending functional capabilities after the procedure with therapy

## 2022-07-11 NOTE — Plan of Care (Signed)
  Problem: Education: Goal: Knowledge of General Education information will improve Description: Including pain rating scale, medication(s)/side effects and non-pharmacologic comfort measures Outcome: Progressing   Problem: Pain Managment: Goal: General experience of comfort will improve Outcome: Progressing   Problem: Safety: Goal: Ability to remain free from injury will improve Outcome: Progressing   

## 2022-07-11 NOTE — Anesthesia Postprocedure Evaluation (Signed)
Anesthesia Post Note  Patient: Nathan Santiago [facility]  Procedure(s) Performed: TOTAL HIP ARTHROPLASTY ANTERIOR APPROACH (Right: Hip)     Patient location during evaluation: PACU Anesthesia Type: General Level of consciousness: awake and alert Pain management: pain level controlled Vital Signs Assessment: post-procedure vital signs reviewed and stable Respiratory status: spontaneous breathing, nonlabored ventilation, respiratory function stable and patient connected to nasal cannula oxygen Cardiovascular status: blood pressure returned to baseline and stable Postop Assessment: no apparent nausea or vomiting Anesthetic complications: no  No notable events documented.  Last Vitals:  Vitals:   07/11/22 1615 07/11/22 1638  BP: (!) 167/66 (!) 141/56  Pulse: 72 69  Resp: 16 14  Temp:    SpO2: 96% 92%    Last Pain:  Vitals:   07/11/22 1615  TempSrc:   PainSc: 0-No pain                 Santa Lighter

## 2022-07-11 NOTE — Anesthesia Preprocedure Evaluation (Addendum)
Anesthesia Evaluation  Patient identified by MRN, date of birth, ID band Patient awake    Reviewed: Allergy & Precautions, NPO status , Patient's Chart, lab work & pertinent test results, reviewed documented beta blocker date and time   Airway Mallampati: II  TM Distance: >3 FB Neck ROM: Full    Dental  (+) Teeth Intact, Dental Advisory Given   Pulmonary former smoker   Pulmonary exam normal breath sounds clear to auscultation       Cardiovascular hypertension, Pt. on medications and Pt. on home beta blockers + CAD and + Peripheral Vascular Disease (AAA)  Normal cardiovascular exam Rhythm:Regular Rate:Normal     Neuro/Psych negative neurological ROS     GI/Hepatic Neg liver ROS, PUD,GERD  Medicated,,  Endo/Other  negative endocrine ROS    Renal/GU negative Renal ROS     Musculoskeletal right hip fracture   Abdominal   Peds  Hematology  (+) Blood dyscrasia (Plavix), anemia Plt 202k   Anesthesia Other Findings Day of surgery medications reviewed with the patient.  Reproductive/Obstetrics                             Anesthesia Physical Anesthesia Plan  ASA: 4  Anesthesia Plan: General   Post-op Pain Management: Tylenol PO (pre-op)* and Gabapentin PO (pre-op)*   Induction: Intravenous  PONV Risk Score and Plan: 2 and Dexamethasone and Ondansetron  Airway Management Planned: Oral ETT  Additional Equipment: ClearSight  Intra-op Plan:   Post-operative Plan: Extubation in OR  Informed Consent: I have reviewed the patients History and Physical, chart, labs and discussed the procedure including the risks, benefits and alternatives for the proposed anesthesia with the patient or authorized representative who has indicated his/her understanding and acceptance.     Dental advisory given  Plan Discussed with: CRNA  Anesthesia Plan Comments:        Anesthesia Quick Evaluation

## 2022-07-11 NOTE — Op Note (Signed)
NAME:  [name] [facility]                ACCOUNT NO.: [id number]      MEDICAL RECORD NO.: [id number]      FACILITY:  [facility]      PHYSICIAN:  Mauri Pole  DATE OF BIRTH:  May 09, 1931     DATE OF PROCEDURE:  07/11/2022                                 OPERATIVE REPORT         PREOPERATIVE DIAGNOSIS: Right  hip femoral neck fracture.      POSTOPERATIVE DIAGNOSIS:  Right hip femoral neck fracture.      PROCEDURE:  Right total hip replacement through an anterior approach   utilizing DePuy THR system, component size 56 mm pinnacle cup, a size 36+4 neutral   Altrex liner, a size 8 Hi Actis stem with a 36+5 Articuleze metal head ball.      SURGEON:  Pietro Cassis. Alvan Dame, M.D.      ASSISTANT:  Costella Hatcher, PA-C     ANESTHESIA:  General.      SPECIMENS:  None.      COMPLICATIONS:  None.      BLOOD LOSS:  300 cc     DRAINS:  None.      INDICATION OF THE PROCEDURE:  Nathan Santiago is a 88 y.o. male who presented to the emergency room after a ground-level fall.  He states that he was in a chair that got tangled up with another chair resulted in him falling backwards onto his right side.  Had immediate onset of pain.  He had inability to bear weight.  He is brought to the emergency room where radiographs revealed a displaced right femoral neck fracture.  He was admitted to the hospitalist service.  Surgery was scheduled for a right total hip replacement.  The indications for the procedure were discussed and reviewed.  Risks of infection DVT component failure dislocation neurovascular injury and the need for future surgeries reviewed.  Postoperative course and expectations reviewed.  Consent was obtained for management of his fracture as well as pain control.     PROCEDURE IN DETAIL:  The patient was brought to operative theater.   Once adequate anesthesia, preoperative antibiotics, 2 gm of Ancef, 1 gm of Tranexamic Acid, and 10 mg of Decadron were administered, the  patient was positioned supine on the [employer] table.  Once the patient was safely positioned with adequate padding of boney prominences we predraped out the hip, and used fluoroscopy to confirm orientation of the pelvis.      The right hip was then prepped and draped from proximal iliac crest to   mid thigh with a shower curtain technique.      Time-out was performed identifying the patient, planned procedure, and the appropriate extremity.     An incision was then made 2 cm lateral to the   anterior superior iliac spine extending over the orientation of the   tensor fascia lata muscle and sharp dissection was carried down to the   fascia of the muscle.      The fascia was then incised.  The muscle belly was identified and swept   laterally and retractor placed along the superior neck.  Following   cauterization of the circumflex vessels and removing some pericapsular   fat, a  second cobra retractor was placed on the inferior neck.  A T-capsulotomy was made along the line of the   superior neck to the trochanteric fossa, then extended proximally and   distally.  Tag sutures were placed and the retractors were then placed   intracapsular.  We then identified the trochanteric fossa and   orientation of my neck cut and then made a neck osteotomy with the femur on traction.  The fractured femoral neck segment and then the femoral head were removed without difficulty or complication.  Traction was let   off and retractors were placed posterior and anterior around the   acetabulum.      The labrum and foveal tissue were debrided.  I began reaming with a 47 mm   reamer and reamed up to 55 mm reamer with good bony bed preparation and a 56 mm  cup was chosen.  The final 56 mm Pinnacle cup was then impacted under fluoroscopy to confirm the depth of penetration and orientation with respect to   Abduction and forward flexion.  A screw was placed into the ilium followed by the hole eliminator.  The  final   36+4 neutral Altrex liner was impacted with good visualized rim fit.  The cup was positioned anatomically within the acetabular portion of the pelvis.      At this point, the femur was rolled to 100 degrees.  Further capsule was   released off the inferior aspect of the femoral neck.  I then   released the superior capsule proximally.  With the leg in a neutral position the hook was placed laterally   along the femur under the vastus lateralis origin and elevated manually and then held in position using the hook attachment on the bed.  The leg was then extended and adducted with the leg rolled to 100   degrees of external rotation.  Retractors were placed along the medial calcar and posteriorly over the greater trochanter.  Once the proximal femur was fully   exposed, I used a box osteotome to set orientation.  I then began   broaching with the starting chili pepper broach and passed this by hand and then broached up to 8 .  With the 8 broach in place I chose a high offset neck and did several trial reductions.  The offset was appropriate, leg lengths   appeared to be equal best matched with the +5 head ball trial confirmed radiographically.   Given these findings, I went ahead and dislocated the hip, repositioned all   retractors and positioned the right hip in the extended and abducted position.  The final 8 Hi Actis stem was   chosen and it was impacted down to the level of neck cut.  Based on this   and the trial reductions, a final 36+5 Articuleze metal head ball was chosen and   impacted onto a clean and dry trunnion, and the hip was reduced.  The   hip had been irrigated throughout the case again at this point.  I did   reapproximate the superior capsular leaflet to the anterior leaflet   using #1 Vicryl.  The fascia of the   tensor fascia lata muscle was then reapproximated using #1 Vicryl and #0 Stratafix sutures.  The   remaining wound was closed with 2-0 Vicryl and running 4-0  Monocryl.   The hip was cleaned, dried, and dressed sterilely using Dermabond and   Aquacel dressing.  The patient was then brought  to recovery room in stable condition tolerating the procedure well.    Costella Hatcher, PA-C was present for the entirety of the case involved from   preoperative positioning, perioperative retractor management, general   facilitation of the case, as well as primary wound closure as assistant.            Pietro Cassis Alvan Dame, M.D.        07/11/2022 12:09 PM

## 2022-07-11 NOTE — Anesthesia Procedure Notes (Signed)
Procedure Name: Intubation Date/Time: 07/11/2022 1:30 PM  Performed by: Talbot Grumbling, CRNAPre-anesthesia Checklist: Patient identified, Emergency Drugs available, Suction available and Patient being monitored Patient Re-evaluated:Patient Re-evaluated prior to induction Oxygen Delivery Method: Circle system utilized Preoxygenation: Pre-oxygenation with 100% oxygen Induction Type: IV induction Ventilation: Mask ventilation without difficulty Laryngoscope Size: Mac and 3 Grade View: Grade III Tube type: Oral Tube size: 7.5 mm Number of attempts: 1 Airway Equipment and Method: Stylet Placement Confirmation: positive ETCO2, breath sounds checked- equal and bilateral and ETT inserted through vocal cords under direct vision Secured at: 23 cm Tube secured with: Tape Dental Injury: Teeth and Oropharynx as per pre-operative assessment

## 2022-07-11 NOTE — H&P (View-Only) (Signed)
Patient ID: Nathan Santiago, male   DOB: [birth year], 88 y.o.   MRN: [id number] Subjective: Day of Surgery Procedure(s) (LRB): TOTAL HIP ARTHROPLASTY ANTERIOR APPROACH (Right)    Patient reports pain as moderate. Ready for OR today  Objective:   VITALS:   Vitals:   07/10/22 2100 07/11/22 0609  BP: (!) 164/54 (!) 164/52  Pulse: 69 74  Resp: 17 17  Temp: 98.4 F (36.9 C) 98.6 F (37 C)  SpO2: 93% 94%    Neurovascular intact  LABS Recent Labs    07/09/22 1450 07/11/22 0334  HGB 12.6* 12.2*  HCT 38.5* 37.6*  WBC 9.0 10.2  PLT 257 202    Recent Labs    07/09/22 1450 07/11/22 0334  NA 141 142  K 3.8 3.8  BUN 15 16  CREATININE 0.96 0.84  GLUCOSE 129* 129*    Recent Labs    07/09/22 1450  INR 0.9     Assessment/Plan: Day of Surgery Procedure(s) (LRB): TOTAL HIP ARTHROPLASTY ANTERIOR APPROACH (Right)   To OR today for right THR for management of his hip femoral neck fracture Post op orders to follow Disposition pending functional capabilities after the procedure with therapy

## 2022-07-11 NOTE — Transfer of Care (Signed)
Immediate Anesthesia Transfer of Care Note  Patient: Miguel Christiana [facility]  Procedure(s) Performed: TOTAL HIP ARTHROPLASTY ANTERIOR APPROACH (Right: Hip)  Patient Location: PACU  Anesthesia Type:General  Level of Consciousness: sedated  Airway & Oxygen Therapy: Patient Spontanous Breathing and Patient connected to face mask oxygen  Post-op Assessment: Report given to RN and Post -op Vital signs reviewed and stable  Post vital signs: Reviewed and stable  Last Vitals:  Vitals Value Taken Time  BP    Temp    Pulse 74 07/11/22 1507  Resp 21 07/11/22 1507  SpO2 98 % 07/11/22 1507  Vitals shown include unvalidated device data.  Last Pain:  Vitals:   07/11/22 1217  TempSrc:   PainSc: 6       Patients Stated Pain Goal: 2 (11/91/47 8295)  Complications: No notable events documented.

## 2022-07-11 NOTE — Plan of Care (Signed)
  Problem: Pain Managment: Goal: General experience of comfort will improve Outcome: Progressing   

## 2022-07-12 ENCOUNTER — Encounter (HOSPITAL_COMMUNITY): Payer: Self-pay | Admitting: Orthopedic Surgery

## 2022-07-12 DIAGNOSIS — I10 Essential (primary) hypertension: Secondary | ICD-10-CM | POA: Diagnosis not present

## 2022-07-12 DIAGNOSIS — Z8679 Personal history of other diseases of the circulatory system: Secondary | ICD-10-CM | POA: Diagnosis not present

## 2022-07-12 DIAGNOSIS — K219 Gastro-esophageal reflux disease without esophagitis: Secondary | ICD-10-CM | POA: Diagnosis not present

## 2022-07-12 DIAGNOSIS — N179 Acute kidney failure, unspecified: Secondary | ICD-10-CM

## 2022-07-12 DIAGNOSIS — D62 Acute posthemorrhagic anemia: Secondary | ICD-10-CM | POA: Insufficient documentation

## 2022-07-12 DIAGNOSIS — S72144A Nondisplaced intertrochanteric fracture of right femur, initial encounter for closed fracture: Secondary | ICD-10-CM | POA: Diagnosis not present

## 2022-07-12 LAB — RENAL FUNCTION PANEL
Albumin: 2.7 g/dL — ABNORMAL LOW (ref 3.5–5.0)
Anion gap: 9 (ref 5–15)
BUN: 24 mg/dL — ABNORMAL HIGH (ref 8–23)
CO2: 24 mmol/L (ref 22–32)
Calcium: 8.7 mg/dL — ABNORMAL LOW (ref 8.9–10.3)
Chloride: 110 mmol/L (ref 98–111)
Creatinine, Ser: 1.34 mg/dL — ABNORMAL HIGH (ref 0.61–1.24)
GFR, Estimated: 51 mL/min — ABNORMAL LOW (ref >60)
Glucose, Bld: 181 mg/dL — ABNORMAL HIGH (ref 70–99)
Phosphorus: 3.4 mg/dL (ref 2.5–4.6)
Potassium: 4.6 mmol/L (ref 3.5–5.1)
Sodium: 143 mmol/L (ref 135–145)

## 2022-07-12 LAB — BASIC METABOLIC PANEL
Anion gap: 8 (ref 5–15)
BUN: 26 mg/dL — ABNORMAL HIGH (ref 8–23)
CO2: 25 mmol/L (ref 22–32)
Calcium: 8.7 mg/dL — ABNORMAL LOW (ref 8.9–10.3)
Chloride: 110 mmol/L (ref 98–111)
Creatinine, Ser: 1.32 mg/dL — ABNORMAL HIGH (ref 0.61–1.24)
GFR, Estimated: 52 mL/min — ABNORMAL LOW (ref >60)
Glucose, Bld: 179 mg/dL — ABNORMAL HIGH (ref 70–99)
Potassium: 4.6 mmol/L (ref 3.5–5.1)
Sodium: 143 mmol/L (ref 135–145)

## 2022-07-12 LAB — CBC
HCT: 32.6 % — ABNORMAL LOW (ref 39.0–52.0)
Hemoglobin: 10.5 g/dL — ABNORMAL LOW (ref 13.0–17.0)
MCH: 28.8 pg (ref 26.0–34.0)
MCHC: 32.2 g/dL (ref 30.0–36.0)
MCV: 89.6 fL (ref 80.0–100.0)
Platelets: 201 10*3/uL (ref 150–400)
RBC: 3.64 MIL/uL — ABNORMAL LOW (ref 4.22–5.81)
RDW: 12.8 % (ref 11.5–15.5)
WBC: 14.4 10*3/uL — ABNORMAL HIGH (ref 4.0–10.5)
nRBC: 0 % (ref 0.0–0.2)

## 2022-07-12 LAB — MAGNESIUM: Magnesium: 2.4 mg/dL (ref 1.7–2.4)

## 2022-07-12 MED ORDER — CLOPIDOGREL BISULFATE 75 MG PO TABS
75.0000 mg | ORAL_TABLET | Freq: Every evening | ORAL | Status: DC
  Administered 2022-07-12: 75 mg via ORAL
  Filled 2022-07-12: qty 1

## 2022-07-12 MED ORDER — ASPIRIN 81 MG PO TBEC
81.0000 mg | DELAYED_RELEASE_TABLET | Freq: Every day | ORAL | Status: DC
  Administered 2022-07-12 – 2022-07-13 (×2): 81 mg via ORAL
  Filled 2022-07-12 (×2): qty 1

## 2022-07-12 NOTE — Care Management Important Message (Signed)
Important Message  Patient Details IM Letter given. Name: Nathan Santiago MRN: [id number] Date of Birth: 09-06-1930   Medicare Important Message Given:  Yes     [name] 07/12/2022, 10:06 AM

## 2022-07-12 NOTE — TOC Progression Note (Signed)
Transition of Care [facility]) - Progression Note    Patient Details  Name: Nathan Santiago MRN: [id number] Date of Birth: 10/15/1930  Transition of Care [facility]) CM/SW Contact  Lennart Pall, LCSW Phone Number: 07/12/2022, 1:26 PM  Clinical Narrative:     Per MDs, anticipating pt will be medically ready for dc tomorrow to SNF rehab at [facility].  Pt and wife aware and agreeable.    Expected Discharge Plan: [place] Barriers to Discharge: Continued Medical Work up  Expected Discharge Plan and Services Expected Discharge Plan: [place] In-house Referral: Clinical Social Work   Post Acute Care Choice: [place] Living arrangements for the past 2 months: [place]                 DME Arranged: N/A DME Agency: NA                   Social Determinants of Health (SDOH) Interventions Food Insecurity Interventions: Intervention Not Indicated Housing Interventions: Intervention Not Indicated Transportation Interventions: Intervention Not Indicated Utilities Interventions: Intervention Not Indicated  Readmission Risk Interventions    07/10/2022    2:59 PM  Readmission Risk Prevention Plan  Post Dischage Appt Complete  Medication Screening Complete  Transportation Screening Complete

## 2022-07-12 NOTE — Progress Notes (Signed)
Physical Therapy Treatment Patient Details Name: Nathan Santiago MRN: [id number] DOB: 05/23/1931 Today's Date: 07/12/2022   History of Present Illness 88 year old male with PMH of CAD/remote stent, GERD, HTN, PVD and HLD presenting with mechanical fall and subsequent severe right hip pain and inability to bear weight, and admitted for impacted subcapital femoral neck fracture.  Pt s/p Rt THA on 07/11/22.    PT Comments    Pt assisted with ambulating very short distances in room with seated rest break.  Pt requiring +2 for safety.  Pt at least mod assist to mobilize due to pain, weakness, and poor balance.    Recommendations for follow up therapy are one component of a multi-disciplinary discharge planning process, led by the attending physician.  Recommendations may be updated based on patient status, additional functional criteria and insurance authorization.  Follow Up Recommendations  Skilled nursing-short term rehab (<3 hours/day) Can patient physically be transported by private vehicle: No   Assistance Recommended at Discharge Frequent or constant Supervision/Assistance  Patient can return home with the following A lot of help with walking and/or transfers;A lot of help with bathing/dressing/bathroom   Equipment Recommendations  None recommended by PT    Recommendations for Other Services       Precautions / Restrictions Precautions Precautions: Fall Restrictions Weight Bearing Restrictions: No RLE Weight Bearing: Weight bearing as tolerated     Mobility  Bed Mobility Overal bed mobility: Needs Assistance Bed Mobility: Sit to Supine     Supine to sit: Mod assist Sit to supine: Mod assist   General bed mobility comments: assist for lower body    Transfers Overall transfer level: Needs assistance Equipment used: Rolling walker (2 wheels) Transfers: Sit to/from Stand Sit to Stand: Mod assist, +2 safety/equipment   Step pivot transfers: Mod assist, +2  safety/equipment       General transfer comment: multimodal cues for technique, assist to rise, steady and control descent, mod assist for weight shifting for weakness, pain and stability    Ambulation/Gait Ambulation/Gait assistance: Mod assist, +2 safety/equipment, +2 physical assistance Gait Distance (Feet): 4 Feet Assistive device: Rolling walker (2 wheels) Gait Pattern/deviations: Step-to pattern, Antalgic       General Gait Details: pt keeps weight shifted posteriorly requiring mod assist for stabilizing; performed 4' then seated rest break, pt then performed another 7' with his shoes on; assist also for manuevering RW, kept recliner close for safety   Stairs             Wheelchair Mobility    Modified Rankin (Stroke Patients Only)       Balance Overall balance assessment: History of Falls                                          Cognition Arousal/Alertness: Awake/alert Behavior During Therapy: Anxious Overall Cognitive Status: Impaired/Different from baseline                                 General Comments: asking same questions, requiring repeated multimodal cues, appears anxious        Exercises      General Comments        Pertinent Vitals/Pain Pain Assessment Pain Assessment: Faces Faces Pain Scale: Hurts even more Pain Location: right hip Pain Descriptors / Indicators: Sore, Grimacing Pain Intervention(s): Repositioned, Monitored during  session    Home Living Family/patient expects to be discharged to:: Skilled nursing facility Living Arrangements: Spouse/significant other   Type of Home: Independent living facility Home Access: Level entry         Home Equipment: Rollator (4 wheels)      Prior Function            PT Goals (current goals can now be found in the care plan section) Acute Rehab PT Goals PT Goal Formulation: With patient/family Time For Goal Achievement: 07/26/22 Potential to  Achieve Goals: Good Progress towards PT goals: Progressing toward goals    Frequency    Min 3X/week      PT Plan Current plan remains appropriate    Co-evaluation              AM-PAC PT "6 Clicks" Mobility   Outcome Measure  Help needed turning from your back to your side while in a flat bed without using bedrails?: A Lot Help needed moving from lying on your back to sitting on the side of a flat bed without using bedrails?: A Lot Help needed moving to and from a bed to a chair (including a wheelchair)?: A Lot Help needed standing up from a chair using your arms (e.g., wheelchair or bedside chair)?: A Lot Help needed to walk in hospital room?: Total Help needed climbing 3-5 steps with a railing? : Total 6 Click Score: 10    End of Session Equipment Utilized During Treatment: Gait belt Activity Tolerance: Patient tolerated treatment well Patient left: in bed;with call bell/phone within reach;with bed alarm set;with family/visitor present Nurse Communication: Mobility status PT Visit Diagnosis: Pain;Other abnormalities of gait and mobility (R26.89);Unsteadiness on feet (R26.81) Pain - Right/Left: Right Pain - part of body: Hip     Time: 9211-9417 PT Time Calculation (min) (ACUTE ONLY): 15 min  Charges:  $Gait Training: 8-22 mins                    Jannette Spanner PT, DPT Physical Therapist Acute Rehabilitation Services Preferred contact method: Secure Chat Weekend Pager Only: [phone] Office: [place] 07/12/2022, 3:36 PM

## 2022-07-12 NOTE — Progress Notes (Signed)
   Subjective: 1 Day Post-Op Procedure(s) (LRB): TOTAL HIP ARTHROPLASTY ANTERIOR APPROACH (Right) Patient reports pain as mild.   Patient seen in rounds by Dr. Alvan Dame. Patient is resting in bed this morning. He is confused. No acute events overnight. We will start therapy today.   Objective: Vital signs in last 24 hours: Temp:  [97.6 F (36.4 C)-98.6 F (37 C)] 97.6 F (36.4 C) (12/13 0551) Pulse Rate:  [65-74] 65 (12/13 0551) Resp:  [14-21] 17 (12/13 0551) BP: (137-175)/(48-69) 159/67 (12/13 0551) SpO2:  [91 %-98 %] 96 % (12/13 0551) Weight:  [73.9 kg] 73.9 kg (12/12 1217)  Intake/Output from previous day:  Intake/Output Summary (Last 24 hours) at 07/12/2022 0737 Last data filed at 07/12/2022 0600 Gross per 24 hour  Intake 2763.57 ml  Output 1300 ml  Net 1463.57 ml     Intake/Output this shift: No intake/output data recorded.  Labs: Recent Labs    07/09/22 1450 07/11/22 0334 07/12/22 0329  HGB 12.6* 12.2* 10.5*   Recent Labs    07/11/22 0334 07/12/22 0329  WBC 10.2 14.4*  RBC 4.26 3.64*  HCT 37.6* 32.6*  PLT 202 201   Recent Labs    07/12/22 0329 07/12/22 0330  NA 143 143  K 4.6 4.6  CL 110 110  CO2 25 24  BUN 26* 24*  CREATININE 1.32* 1.34*  GLUCOSE 179* 181*  CALCIUM 8.7* 8.7*   Recent Labs    07/09/22 1450  INR 0.9    Exam: General - Patient is Alert and Confused Extremity - Neurologically intact Sensation intact distally Intact pulses distally Dorsiflexion/Plantar flexion intact Dressing - dressing C/D/I Motor Function - intact, moving foot and toes well on exam.   Past Medical History:  Diagnosis Date   AAA (abdominal aortic aneurysm) (HCC)    asymptomatic   AAA (abdominal aortic aneurysm) ([place]) 2010   peripheral  angiogram-- bilateral SFA DISEASE  and 60 to 70% infrarenaal abd. aortic stenosis with 15 -mm gradient   Adenomatous colon polyp 11/2003   CAD (coronary artery disease)    Gait abnormality 02/13/2017   Gastroparesis     pt denies   GERD (gastroesophageal reflux disease)    w/ LPR   History of kidney stones    x2   Hyperlipidemia    Hypertension    IDA (iron deficiency anemia)    Peripheral arterial disease ([place])    RCEA  by Dr Lemar Livings   PUD (peptic ulcer disease)    pt unaware   Vertigo     Assessment/Plan: 1 Day Post-Op Procedure(s) (LRB): TOTAL HIP ARTHROPLASTY ANTERIOR APPROACH (Right) Principal Problem:   Intertrochanteric fracture of right femur ([place]) Active Problems:   Essential hypertension   History of CAD (coronary artery disease)   GERD   Preoperative clearance   Accidental fall   Delirium  Estimated body mass index is 27.98 kg/m as calculated from the following:   Height as of this encounter: '5\' 4"'$  (1.626 m).   Weight as of this encounter: 73.9 kg. Advance diet Up with therapy  DVT Prophylaxis - May resume aspirin and plavix today Weight bearing as tolerated.  Hgb stable at 10.5 this AM. Up with PT today to determine disposition.    Griffith Citron, PA-C Orthopedic Surgery [phone] 07/12/2022, 7:37 AM

## 2022-07-12 NOTE — Progress Notes (Signed)
PROGRESS NOTE  Nathan Santiago [facility] IRS:[id number] DOB: 1931-04-28   PCP: [name], MD  Patient is from: Home.  Lives with his wife.  DOA: 07/09/2022 LOS: 3  Chief complaints Chief Complaint  Patient presents with   Fall   Hip Pain     Brief Narrative / Interim history: 88 year old M with PMH of CAD/remote stent, GERD, HTN, PVD and HLD presenting with mechanical fall and subsequent severe right hip pain and inability to bear weight, and admitted for impacted subcapital femoral neck fracture.  Orthopedic surgery consulted.   Patient underwent right THA by Dr. Alvan Dame on 07/11/2022.  EBL about 300 cc.   Subjective: Seen and examined earlier this morning.  No major events overnight of this morning.  A little confused but is still oriented to self, place and year.  He did not recall having surgery.  Objective: Vitals:   07/11/22 2213 07/12/22 0150 07/12/22 0551 07/12/22 1002  BP: (!) 137/48 (!) 165/69 (!) 159/67 (!) 184/68  Pulse: 70 69 65 73  Resp: '17 17 17 17  '$ Temp: 98 F (36.7 C) 97.6 F (36.4 C) 97.6 F (36.4 C) 97.6 F (36.4 C)  TempSrc:      SpO2: 96% 98% 96% 96%  Weight:      Height:        Examination:  GENERAL: No apparent distress.  Nontoxic. HEENT: MMM.  Vision and hearing grossly intact.  NECK: Supple.  No apparent JVD.  RESP:  No IWOB.  Fair aeration bilaterally. CVS:  RRR. Heart sounds normal.  ABD/GI/GU: BS+. Abd soft, NTND.  MSK/EXT:  Moves extremities. No apparent deformity. No edema.  SKIN: Dressing over right hip DCI NEURO: Awake and alert.  A little confused but oriented x 4 except month and date.  No apparent focal neuro deficit. PSYCH: Calm.  No distress or agitation.  Procedures:  07/11/2022-right THA by Dr. Alvan Dame  Microbiology summarized: MRSA PCR screen negative.  Assessment and plan: Principal Problem:   Intertrochanteric fracture of right femur (HCC) Active Problems:   Essential hypertension   History of CAD (coronary artery  disease)   GERD   AKI (acute kidney injury) (HCC)   Preoperative clearance   Accidental fall   Delirium   Acute postoperative anemia due to expected blood loss  Accidental fall at home Closed impacted subcapital femoral neck fracture involving the proximal right femur: Due to fall. -S/p right THA by Dr. Alvan Dame on 07/11/2022 -Pain control and DVT prophylaxis per orthopedic surgery -PT/OT   History of CAD/remote stent: Stable.  Cardiopulmonary symptoms.  No prodromes leading to fall. -Continue home medications -Resume home Plavix.  Essential hypertension: BP slightly elevated.  Partly due to pain. -Pain control -Continue home losartan, atenolol, amlodipine -Labetalol as needed.  Postoperative acute blood loss anemia: Admitted EBL of 300 cc. Recent Labs    09/26/21 0808 05/02/22 2120 05/13/22 1510 05/13/22 2341 05/14/22 0452 05/15/22 0613 05/16/22 0110 07/09/22 1450 07/11/22 0334 07/12/22 0329  HGB 9.9* 13.5 12.6* 11.6* 12.2* 13.5 12.6* 12.6* 12.2* 10.5*  -Monitor H&H  AKI: Likely from dehydration. Recent Labs    05/02/22 2120 05/13/22 1510 05/14/22 0452 05/15/22 0613 05/22/22 0000 05/31/22 0000 07/09/22 1450 07/11/22 0334 07/12/22 0329 07/12/22 0330  BUN 24* 33* 24* 21 16 22* 15 16 26* 24*  CREATININE 1.10 1.43* 1.14 1.05 1.0 1.0 0.96 0.84 1.32* 1.34*  -Continue IV fluid -Monitor urine output -Recheck in the morning   Delirium: Likely from pain medication.  A little disoriented but improved. -  Delirium and fall precaution -Minimize sedating medications  GERD -PPI  BPH without LUTS -Continue home meds.   Body mass index is 27.98 kg/m.   Nutrition Problem: Increased nutrient needs Etiology: other (see comment) (femur fracture) Signs/Symptoms: estimated needs Interventions: Boost Plus   DVT prophylaxis:  SCDs Start: 07/11/22 1655 Place TED hose Start: 07/11/22 1655  Code Status: Full code Family Communication: None at bedside Level of care:  Med-Surg Status is: Inpatient Remains inpatient appropriate because: Right femoral neck fracture   Final disposition: TBD Consultants:  Orthopedic surgery  Sch Meds:  Scheduled Meds:  acetaminophen  1,000 mg Oral Q6H   amLODipine  10 mg Oral Daily   aspirin EC  81 mg Oral Daily   atenolol  50 mg Oral Daily   atorvastatin  40 mg Oral q1800   clopidogrel  75 mg Oral QPM   docusate sodium  100 mg Oral BID   feeding supplement  1 Container Oral Q24H   finasteride  5 mg Oral Daily   gabapentin  100 mg Oral TID   iron polysaccharides  150 mg Oral Q M,W,F   losartan  50 mg Oral Daily   pantoprazole  40 mg Oral QHS   polyethylene glycol  17 g Oral BID   potassium chloride SA  20 mEq Oral QPM   senna  1 tablet Oral BID   tamsulosin  0.4 mg Oral QPC supper   Continuous Infusions:  sodium chloride 75 mL/hr at 07/11/22 1743   methocarbamol (ROBAXIN) IV     PRN Meds:.bisacodyl, diazepam, diphenhydrAMINE, HYDROmorphone (DILAUDID) injection, labetalol, menthol-cetylpyridinium **OR** phenol, methocarbamol **OR** methocarbamol (ROBAXIN) IV, metoCLOPramide **OR** metoCLOPramide (REGLAN) injection, ondansetron **OR** ondansetron (ZOFRAN) IV, oxyCODONE, oxyCODONE, zolpidem  Antimicrobials: Anti-infectives (From admission, onward)    Start     Dose/Rate Route Frequency Ordered Stop   07/11/22 2000  ceFAZolin (ANCEF) IVPB 2g/100 mL premix        2 g 200 mL/hr over 30 Minutes Intravenous Every 6 hours 07/11/22 1654 07/12/22 0223   07/11/22 1200  ceFAZolin (ANCEF) IVPB 2g/100 mL premix        2 g 200 mL/hr over 30 Minutes Intravenous On call to O.R. 07/11/22 0853 07/11/22 1346        I have personally reviewed the following labs and images: CBC: Recent Labs  Lab 07/09/22 1450 07/11/22 0334 07/12/22 0329  WBC 9.0 10.2 14.4*  NEUTROABS 7.3  --   --   HGB 12.6* 12.2* 10.5*  HCT 38.5* 37.6* 32.6*  MCV 87.1 88.3 89.6  PLT 257 202 201   BMP &GFR Recent Labs  Lab 07/09/22 1450  07/11/22 0334 07/12/22 0329 07/12/22 0330  NA 141 142 143 143  K 3.8 3.8 4.6 4.6  CL 108 108 110 110  CO2 '25 26 25 24  '$ GLUCOSE 129* 129* 179* 181*  BUN 15 16 26* 24*  CREATININE 0.96 0.84 1.32* 1.34*  CALCIUM 9.0 8.7* 8.7* 8.7*  MG  --  2.1 2.4  --   PHOS  --  3.3  --  3.4   Estimated Creatinine Clearance: 34.4 mL/min (A) (by C-G formula based on SCr of 1.34 mg/dL (H)). Liver & Pancreas: Recent Labs  Lab 07/11/22 0334 07/12/22 0330  ALBUMIN 3.3* 2.7*   No results for input(s): "LIPASE", "AMYLASE" in the last 168 hours. No results for input(s): "AMMONIA" in the last 168 hours. Diabetic: No results for input(s): "HGBA1C" in the last 72 hours. No results for input(s): "GLUCAP" in the  last 168 hours. Cardiac Enzymes: No results for input(s): "CKTOTAL", "CKMB", "CKMBINDEX", "TROPONINI" in the last 168 hours. No results for input(s): "PROBNP" in the last 8760 hours. Coagulation Profile: Recent Labs  Lab 07/09/22 1450  INR 0.9   Thyroid Function Tests: No results for input(s): "TSH", "T4TOTAL", "FREET4", "T3FREE", "THYROIDAB" in the last 72 hours. Lipid Profile: No results for input(s): "CHOL", "HDL", "LDLCALC", "TRIG", "CHOLHDL", "LDLDIRECT" in the last 72 hours. Anemia Panel: No results for input(s): "VITAMINB12", "FOLATE", "FERRITIN", "TIBC", "IRON", "RETICCTPCT" in the last 72 hours. Urine analysis:    Component Value Date/Time   COLORURINE YELLOW 05/14/2022 0846   APPEARANCEUR CLEAR 05/14/2022 0846   LABSPEC 1.019 05/14/2022 0846   PHURINE 5.0 05/14/2022 0846   GLUCOSEU NEGATIVE 05/14/2022 0846   HGBUR NEGATIVE 05/14/2022 0846   BILIRUBINUR NEGATIVE 05/14/2022 0846   KETONESUR 5 (A) 05/14/2022 0846   PROTEINUR NEGATIVE 05/14/2022 0846   UROBILINOGEN 0.2 10/28/2010 0909   NITRITE NEGATIVE 05/14/2022 0846   LEUKOCYTESUR NEGATIVE 05/14/2022 0846   Sepsis Labs: Invalid input(s): "PROCALCITONIN", "LACTICIDVEN"  Microbiology: Recent Results (from the past 240  hour(s))  Surgical PCR screen     Status: None   Collection Time: 07/10/22  9:20 AM   Specimen: Nasal Mucosa; Nasal Swab  Result Value Ref Range Status   MRSA, PCR NEGATIVE NEGATIVE Final   Staphylococcus aureus NEGATIVE NEGATIVE Final    Comment: (NOTE) The Xpert SA Assay (FDA approved for NASAL specimens in patients 86 years of age and older), is one component of a comprehensive surveillance program. It is not intended to diagnose infection nor to guide or monitor treatment. Performed at [facility], [place] [address], [place], [place] [address]     Radiology Studies: DG Pelvis Portable  Result Date: 07/11/2022 CLINICAL DATA:  Right hip replacement EXAM: PORTABLE PELVIS 1 VIEWS COMPARISON:  05/13/2022 FINDINGS: Right hip replacement in satisfactory position and alignment. No fracture or complication. Mild joint space narrowing left hip. IMPRESSION: Satisfactory right hip replacement without complication. Electronically Signed   By: Franchot Gallo M.D.   On: 07/11/2022 15:32   DG C-Arm 1-60 Min-No Report  Result Date: 07/11/2022 CLINICAL DATA:  Right hip replacement EXAM: DG HIP (WITH OR WITHOUT PELVIS) 1V RIGHT; DG C-ARM 1-60 MIN-NO REPORT COMPARISON:  07/09/2022 FINDINGS: Seven C-arm images were obtained of the right hip. Right hip replacement in satisfactory position and alignment. No acute complication. IMPRESSION: Satisfactory right hip replacement. Electronically Signed   By: Franchot Gallo M.D.   On: 07/11/2022 14:54   DG HIP UNILAT WITH PELVIS 1V RIGHT  Result Date: 07/11/2022 CLINICAL DATA:  Right hip replacement EXAM: DG HIP (WITH OR WITHOUT PELVIS) 1V RIGHT; DG C-ARM 1-60 MIN-NO REPORT COMPARISON:  07/09/2022 FINDINGS: Seven C-arm images were obtained of the right hip. Right hip replacement in satisfactory position and alignment. No acute complication. IMPRESSION: Satisfactory right hip replacement. Electronically Signed   By: Franchot Gallo M.D.   On:  07/11/2022 14:54   DG C-Arm 1-60 Min-No Report  Result Date: 07/11/2022 CLINICAL DATA:  Right hip replacement EXAM: DG HIP (WITH OR WITHOUT PELVIS) 1V RIGHT; DG C-ARM 1-60 MIN-NO REPORT COMPARISON:  07/09/2022 FINDINGS: Seven C-arm images were obtained of the right hip. Right hip replacement in satisfactory position and alignment. No acute complication. IMPRESSION: Satisfactory right hip replacement. Electronically Signed   By: Franchot Gallo M.D.   On: 07/11/2022 14:54      [address] [place]  If 7PM-7AM, please contact night-coverage  [email or web address] 07/12/2022, 11:24 AM

## 2022-07-12 NOTE — Evaluation (Signed)
Physical Therapy Evaluation Patient Details Name: Nathan Santiago MRN: [id number] DOB: [birth year] Today's Date: 07/12/2022  History of Present Illness  88 year old male with PMH of CAD/remote stent, GERD, HTN, PVD and HLD presenting with mechanical fall and subsequent severe right hip pain and inability to bear weight, and admitted for impacted subcapital femoral neck fracture.  Pt s/p Rt THA on 07/11/22.  Clinical Impression  Pt admitted with above diagnosis.  Pt currently with functional limitations due to the deficits listed below (see PT Problem List). Pt will benefit from skilled PT to increase their independence and safety with mobility to allow discharge to the venue listed below.  Pt from independent living facility with spouse and typically able to ambulate with rollator.  Pt requiring at least mod assist for transfers at this time.  Pt would benefit from d/c to SNF for rehab.        Recommendations for follow up therapy are one component of a multi-disciplinary discharge planning process, led by the attending physician.  Recommendations may be updated based on patient status, additional functional criteria and insurance authorization.  Follow Up Recommendations Skilled nursing-short term rehab (<3 hours/day) Can patient physically be transported by private vehicle: No    Assistance Recommended at Discharge Frequent or constant Supervision/Assistance  Patient can return home with the following  A lot of help with walking and/or transfers;A lot of help with bathing/dressing/bathroom    Equipment Recommendations None recommended by PT  Recommendations for Other Services       Functional Status Assessment Patient has had a recent decline in their functional status and demonstrates the ability to make significant improvements in function in a reasonable and predictable amount of time.     Precautions / Restrictions Precautions Precautions: Fall Restrictions Weight Bearing  Restrictions: No RLE Weight Bearing: Weight bearing as tolerated      Mobility  Bed Mobility Overal bed mobility: Needs Assistance Bed Mobility: Supine to Sit     Supine to sit: Mod assist     General bed mobility comments: assist for Rt LE and trunk upright    Transfers Overall transfer level: Needs assistance Equipment used: Rolling walker (2 wheels) Transfers: Sit to/from Stand, Bed to chair/wheelchair/BSC Sit to Stand: Mod assist, From elevated surface   Step pivot transfers: Mod assist       General transfer comment: multimodal cues for technique, assist to rise, steady and control descent, mod assist for weight shifting for weakness, pain and stability so only took a few steps over to recliner    Ambulation/Gait                  Stairs            Wheelchair Mobility    Modified Rankin (Stroke Patients Only)       Balance Overall balance assessment: History of Falls                                           Pertinent Vitals/Pain Pain Assessment Pain Assessment: Faces Faces Pain Scale: Hurts even more Pain Location: right hip Pain Descriptors / Indicators: Sore, Grimacing Pain Intervention(s): Monitored during session, Repositioned, Premedicated before session    Home Living Family/patient expects to be discharged to:: Skilled nursing facility Living Arrangements: Spouse/significant other   Type of Home: Independent living facility Home Access: Level entry  Home Equipment: Rollator (4 wheels)      Prior Function Prior Level of Function : Independent/Modified Independent             Mobility Comments: normally uses rollator, goes to dining hall at least once daily       Hand Dominance        Extremity/Trunk Assessment        Lower Extremity Assessment Lower Extremity Assessment: Generalized weakness;RLE deficits/detail RLE Deficits / Details: anticipated post op hip weakness observed,  able to perform ankle pumps RLE: Unable to fully assess due to pain       Communication   Communication: No difficulties  Cognition Arousal/Alertness: Awake/alert Behavior During Therapy: Anxious Overall Cognitive Status: Impaired/Different from baseline                                 General Comments: asking same questions, requiring repeated multimodal cues, appears anxious and reports shakiness from "worry" and "fear"        General Comments      Exercises     Assessment/Plan    PT Assessment Patient needs continued PT services  PT Problem List         PT Treatment Interventions Gait training;DME instruction;Therapeutic exercise;Balance training;Functional mobility training;Therapeutic activities;Patient/family education    PT Goals (Current goals can be found in the Care Plan section)  Acute Rehab PT Goals PT Goal Formulation: With patient/family Time For Goal Achievement: 07/26/22 Potential to Achieve Goals: Good    Frequency Min 3X/week     Co-evaluation               AM-PAC PT "6 Clicks" Mobility  Outcome Measure Help needed turning from your back to your side while in a flat bed without using bedrails?: A Lot Help needed moving from lying on your back to sitting on the side of a flat bed without using bedrails?: A Lot Help needed moving to and from a bed to a chair (including a wheelchair)?: A Lot Help needed standing up from a chair using your arms (e.g., wheelchair or bedside chair)?: A Lot Help needed to walk in hospital room?: Total Help needed climbing 3-5 steps with a railing? : Total 6 Click Score: 10    End of Session Equipment Utilized During Treatment: Gait belt Activity Tolerance: Patient tolerated treatment well Patient left: in chair;with call bell/phone within reach;with chair alarm set;with family/visitor present Nurse Communication: Mobility status PT Visit Diagnosis: Pain;Other abnormalities of gait and mobility  (R26.89);Unsteadiness on feet (R26.81) Pain - Right/Left: Right Pain - part of body: Hip    Time: 8250-5397 PT Time Calculation (min) (ACUTE ONLY): 16 min   Charges:   PT Evaluation $PT Eval Low Complexity: 1 Low         Kati PT, DPT Physical Therapist Acute Rehabilitation Services Preferred contact method: Secure Chat Weekend Pager Only: [phone] Office: [phone]   Myrtis Hopping [place] 07/12/2022, 12:03 PM

## 2022-07-12 NOTE — NC FL2 (Signed)
[place] LEVEL OF CARE FORM     IDENTIFICATION  Patient Name: Nathan Santiago Birthdate: Nov 19, 1930 Sex: male Admission Date (Current Location): 07/09/2022  [facility] and [place] Number:  Herbalist and Address:  [facility],  [place], [place]      Provider Number: [id number]  Attending Physician Name and Address:  Mercy Riding, MD  Relative Name and Phone Number:  wife, Ubaldo Daywalt [phone]    Current Level of Care: Hospital Recommended Level of Care: [place] Prior Approval Number:    Date Approved/Denied:   PASRR Number: [id number]  Discharge Plan: SNF    Current Diagnoses: Patient Active Problem List   Diagnosis Date Noted   Acute postoperative anemia due to expected blood loss 07/12/2022   Delirium 07/11/2022   Preoperative clearance 07/10/2022   Accidental fall 07/10/2022   Intertrochanteric fracture of right femur ([place]) 07/09/2022   AKI (acute kidney injury) ([place]) 05/13/2022   Neuropathy 05/13/2022   Status post total knee replacement, left 01/03/2019   Pain in left knee 08/05/2018   Pain in right knee 08/05/2018   Foreign body in ear 12/21/2017   Hematuria 10/10/2017   Gait abnormality 02/13/2017   UTI (urinary tract infection) 11/22/2016   Vertigo 11/22/2016   Hypokalemia 11/22/2016   Urinary frequency 09/08/2016   Claudication ([place]) 04/24/2016   Sensorineural hearing loss (SNHL), bilateral 03/09/2016   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   Peripheral arterial disease ([place]) 08/06/2013   GASTROPARESIS 02/25/2009   COLONIC POLYPS, ADENOMATOUS, HX OF 02/22/2009   HYPERCHOLESTEROLEMIA 09/26/2007   Essential hypertension 09/26/2007   History of CAD (coronary artery disease) 09/26/2007   Allergic rhinitis 09/26/2007   GERD 09/26/2007   Peptic ulcer 43/15/4008   UMBILICAL HERNIA 67/61/9509   NEPHROLITHIASIS 09/26/2007    Orientation RESPIRATION BLADDER Height  & Weight     Self, Time, Situation, Place  Normal Continent Weight: 163 lb (73.9 kg) Height:  '5\' 4"'$  (162.6 cm)  BEHAVIORAL SYMPTOMS/MOOD NEUROLOGICAL BOWEL NUTRITION STATUS      Continent Diet (regular)  AMBULATORY STATUS COMMUNICATION OF NEEDS Skin   Limited Assist Verbally Other (Comment) (surgical incision only)                       Personal Care Assistance Level of Assistance  Bathing, Dressing Bathing Assistance: Limited assistance   Dressing Assistance: Limited assistance     Functional Limitations Info  Sight, Hearing, Speech Sight Info: Adequate Hearing Info: Impaired Speech Info: Adequate    SPECIAL CARE FACTORS FREQUENCY  PT (By licensed PT), OT (By licensed OT)     PT Frequency: 5x/wk OT Frequency: 5x/wk            Contractures Contractures Info: Not present    Additional Factors Info  Code Status, Allergies Code Status Info: Full Allergies Info: Lisinopril, Niaspan (Niacin Er)           Current Medications (07/12/2022):  This is the current hospital active medication list Current Facility-Administered Medications  Medication Dose Route Frequency Provider Last Rate Last Admin   0.9 %  sodium chloride infusion   Intravenous Continuous Irving Copas, PA-C 75 mL/hr at 07/11/22 1743 New Bag at 07/11/22 1743   acetaminophen (TYLENOL) tablet 1,000 mg  1,000 mg Oral Q6H Irving Copas, PA-C   1,000 mg at 07/12/22 1244   amLODipine (NORVASC) tablet 10 mg  10 mg Oral Daily Irving Copas, PA-C  10 mg at 07/12/22 7342   aspirin EC tablet 81 mg  81 mg Oral Daily Irving Copas, PA-C   81 mg at 07/12/22 8768   atenolol (TENORMIN) tablet 50 mg  50 mg Oral Daily Irving Copas, PA-C   50 mg at 07/12/22 1157   atorvastatin (LIPITOR) tablet 40 mg  40 mg Oral [id number] Irving Copas, PA-C   40 mg at 07/11/22 1711   bisacodyl (DULCOLAX) suppository 10 mg  10 mg Rectal Daily PRN Irving Copas, PA-C       clopidogrel (PLAVIX) tablet 75 mg  75  mg Oral QPM Costella Hatcher R, PA-C       diazepam (VALIUM) tablet 2.5 mg  2.5 mg Oral Q8H PRN Irving Copas, PA-C       diphenhydrAMINE (BENADRYL) 12.5 MG/5ML elixir 12.5-25 mg  12.5-25 mg Oral Q4H PRN Irving Copas, PA-C       docusate sodium (COLACE) capsule 100 mg  100 mg Oral BID Irving Copas, PA-C   100 mg at 07/12/22 5597   feeding supplement (BOOST / RESOURCE BREEZE) liquid 1 Container  1 Container Oral Q24H Irving Copas, PA-C   1 Container at 07/10/22 1403   finasteride (PROSCAR) tablet 5 mg  5 mg Oral Daily Irving Copas, PA-C   5 mg at 07/12/22 4163   gabapentin (NEURONTIN) capsule 100 mg  100 mg Oral TID Irving Copas, PA-C   100 mg at 07/12/22 0930   HYDROmorphone (DILAUDID) injection 0.5-1 mg  0.5-1 mg Intravenous Q4H PRN Irving Copas, PA-C       iron polysaccharides (NIFEREX) capsule 150 mg  150 mg Oral Q M,W,F Irving Copas, PA-C   150 mg at 07/12/22 0930   labetalol (NORMODYNE) injection 10 mg  10 mg Intravenous Q2H PRN Irving Copas, PA-C       losartan (COZAAR) tablet 50 mg  50 mg Oral Daily Irving Copas, PA-C   50 mg at 07/12/22 8453   menthol-cetylpyridinium (CEPACOL) lozenge 3 mg  1 lozenge Oral PRN Irving Copas, PA-C       Or   phenol (CHLORASEPTIC) mouth spray 1 spray  1 spray Mouth/Throat PRN Irving Copas, PA-C       methocarbamol (ROBAXIN) tablet 500 mg  500 mg Oral Q6H PRN Costella Hatcher R, PA-C   500 mg at 07/11/22 2058   Or   methocarbamol (ROBAXIN) 500 mg in dextrose 5 % 50 mL IVPB  500 mg Intravenous Q6H PRN Irving Copas, PA-C       metoCLOPramide (REGLAN) tablet 5-10 mg  5-10 mg Oral Q8H PRN Irving Copas, PA-C       Or   metoCLOPramide (REGLAN) injection 5-10 mg  5-10 mg Intravenous Q8H PRN Irving Copas, PA-C       ondansetron [facility]) tablet 4 mg  4 mg Oral Q6H PRN Irving Copas, PA-C       Or   ondansetron [facility]) injection 4 mg  4 mg Intravenous Q6H PRN Irving Copas, PA-C        oxyCODONE (Oxy IR/ROXICODONE) immediate release tablet 10 mg  10 mg Oral Q4H PRN Irving Copas, PA-C       oxyCODONE (Oxy IR/ROXICODONE) immediate release tablet 5 mg  5 mg Oral Q4H PRN Costella Hatcher R, PA-C   5 mg at 07/12/22 0930   pantoprazole (PROTONIX) EC tablet 40 mg  40 mg Oral QHS  Irving Copas, PA-C   40 mg at 07/11/22 4715   polyethylene glycol (MIRALAX / GLYCOLAX) packet 17 g  17 g Oral BID Irving Copas, PA-C   17 g at 07/12/22 9539   potassium chloride SA (KLOR-CON M) CR tablet 20 mEq  20 mEq Oral QPM Irving Copas, PA-C   20 mEq at 07/11/22 1711   senna (SENOKOT) tablet 8.6 mg  1 tablet Oral BID Irving Copas, PA-C   8.6 mg at 07/12/22 6728   tamsulosin (FLOMAX) capsule 0.4 mg  0.4 mg Oral QPC supper Irving Copas, PA-C   0.4 mg at 07/11/22 1711   zolpidem (AMBIEN) tablet 5 mg  5 mg Oral QHS PRN Irving Copas, PA-C         Discharge Medications: Please see discharge summary for a list of discharge medications.  Relevant Imaging Results:  Relevant Lab Results:   Additional Information SS# [id number]  Lennart Pall, LCSW

## 2022-07-13 ENCOUNTER — Ambulatory Visit: Payer: Medicare Other | Admitting: Physician Assistant

## 2022-07-13 DIAGNOSIS — N179 Acute kidney failure, unspecified: Secondary | ICD-10-CM | POA: Diagnosis not present

## 2022-07-13 DIAGNOSIS — S72144A Nondisplaced intertrochanteric fracture of right femur, initial encounter for closed fracture: Secondary | ICD-10-CM | POA: Diagnosis not present

## 2022-07-13 DIAGNOSIS — D62 Acute posthemorrhagic anemia: Secondary | ICD-10-CM | POA: Diagnosis not present

## 2022-07-13 DIAGNOSIS — I10 Essential (primary) hypertension: Secondary | ICD-10-CM | POA: Diagnosis not present

## 2022-07-13 LAB — RENAL FUNCTION PANEL
Albumin: 2.9 g/dL — ABNORMAL LOW (ref 3.5–5.0)
Anion gap: 5 (ref 5–15)
BUN: 33 mg/dL — ABNORMAL HIGH (ref 8–23)
CO2: 25 mmol/L (ref 22–32)
Calcium: 8.8 mg/dL — ABNORMAL LOW (ref 8.9–10.3)
Chloride: 115 mmol/L — ABNORMAL HIGH (ref 98–111)
Creatinine, Ser: 1.04 mg/dL (ref 0.61–1.24)
GFR, Estimated: >60 mL/min (ref >60)
Glucose, Bld: 186 mg/dL — ABNORMAL HIGH (ref 70–99)
Phosphorus: 2.8 mg/dL (ref 2.5–4.6)
Potassium: 4 mmol/L (ref 3.5–5.1)
Sodium: 145 mmol/L (ref 135–145)

## 2022-07-13 LAB — CBC
HCT: 30 % — ABNORMAL LOW (ref 39.0–52.0)
Hemoglobin: 9.6 g/dL — ABNORMAL LOW (ref 13.0–17.0)
MCH: 28.6 pg (ref 26.0–34.0)
MCHC: 32 g/dL (ref 30.0–36.0)
MCV: 89.3 fL (ref 80.0–100.0)
Platelets: 205 10*3/uL (ref 150–400)
RBC: 3.36 MIL/uL — ABNORMAL LOW (ref 4.22–5.81)
RDW: 12.8 % (ref 11.5–15.5)
WBC: 16 10*3/uL — ABNORMAL HIGH (ref 4.0–10.5)
nRBC: 0 % (ref 0.0–0.2)

## 2022-07-13 LAB — MAGNESIUM: Magnesium: 2.2 mg/dL (ref 1.7–2.4)

## 2022-07-13 MED ORDER — HYDRALAZINE HCL 10 MG PO TABS: 10.0000 mg | ORAL_TABLET | Freq: Three times a day (TID) | ORAL | Status: DC

## 2022-07-13 MED ORDER — SENNA 8.6 MG PO TABS: 1.0000 | ORAL_TABLET | Freq: Two times a day (BID) | ORAL | 0 refills | Status: DC

## 2022-07-13 MED ORDER — HYDROCHLOROTHIAZIDE 12.5 MG PO CAPS: 25.0000 mg | ORAL_CAPSULE | Freq: Every day | ORAL | Status: DC

## 2022-07-13 MED ORDER — OXYCODONE HCL 5 MG PO TABS: 5.0000 mg | ORAL_TABLET | Freq: Four times a day (QID) | ORAL | 0 refills | Status: DC | PRN

## 2022-07-13 NOTE — Discharge Summary (Signed)
Physician Discharge Summary  Blair Lundeen [facility] FYB:[id number] DOB: [birth year] DOA: 07/09/2022  PCP: [name], MD  Admit date: 07/09/2022 Discharge date: 07/13/2022 Admitted From: Home Disposition: SNF Recommendations for Outpatient Follow-up:  Follow up with orthopedic surgery as below Check BMP and CBC in 1 week Please follow up on the following pending results: None  Discharge Condition: Stable CODE STATUS: Full code  Contact information for follow-up providers     Paralee Cancel, MD. Schedule an appointment as soon as possible for a visit in 2 week(s).   Specialty: Orthopedic Surgery Contact information: [address] [place] [address] [phone]              Contact information for after-discharge care     Destination     [facility] .   Service: Skilled Nursing Contact information: [place] [facility] course 88 year old M with PMH of CAD/remote stent, GERD, HTN, PVD and HLD presenting with mechanical fall and subsequent severe right hip pain and inability to bear weight, and admitted for impacted subcapital femoral neck fracture.  Orthopedic surgery consulted.    Patient underwent right THA by Dr. Alvan Dame on 07/11/2022.  EBL about 300 cc.  He also had mild AKI.  AKI resolved with IV fluid hydration.  H&H relatively stable.  He was cleared for discharge by orthopedic surgery for outpatient follow-up.  Therapy recommended SNF.  See individual problem list below for more.   Problems addressed during this hospitalization Principal Problem:   Intertrochanteric fracture of right femur (HCC) Active Problems:   Essential hypertension   History of CAD (coronary artery disease)   GERD   AKI (acute kidney injury) (HCC)   Preoperative clearance   Accidental fall   Delirium   Acute postoperative anemia due to expected  blood loss   Accidental fall at home Closed impacted subcapital femoral neck fracture involving the proximal right femur: Due to fall. -S/p right THA by Dr. Alvan Dame on 07/11/2022 -On Plavix and aspirin that could serve DVT prophylaxis. -Pain control per orthopedic surgery. -Bowel regimen. -Continue PT/OT   History of CAD/remote stent: Stable.  Cardiopulmonary symptoms.  No prodromes leading to fall. -Continue home medications   Essential hypertension: BP slightly elevated.  Partly due to pain. -Pain control, home losartan, atenolol and amlodipine -Hold HCTZ for 3 to 4 days. -Increased hydralazine from 10 mg twice daily to 10 mg 3 times daily   Postoperative acute blood loss anemia: Admitted EBL of 300 cc.  Also hemodilution from IV fluid Recent Labs    05/02/22 2120 05/13/22 1510 05/13/22 2341 05/14/22 0452 05/15/22 0613 05/16/22 0110 07/09/22 1450 07/11/22 0334 07/12/22 0329 07/13/22 0335  HGB 13.5 12.6* 11.6* 12.2* 13.5 12.6* 12.6* 12.2* 10.5* 9.6*  -Check CBC 1 week    AKI: Likely from dehydration.  Resolved. -Hold HCTZ for 3 to 4 days -Check BMP in 1 week   Delirium: Likely from pain medication.  Improved. -Delirium and fall precaution -Minimize sedating medications   GERD -PPI   BPH without LUTS -Continue home meds.  Increased nutrient needs Nutrition Problem: Increased nutrient needs Etiology: other (see comment) (femur fracture) Signs/Symptoms: estimated needs Interventions: Boost Plus     Vital signs Vitals:   07/12/22 1428 07/12/22 2157 07/13/22 0520 07/13/22 0953  BP: (!) 162/58 (!) 166/57 (!) 178/52 (!) 191/62  Pulse: 70 77 67 73  Temp: 98.6 F (37 C) 97.8 F (36.6 C) 97.8 F (36.6 C)   Resp: '17 17 17   '$ Height:      Weight:      SpO2: 92% 92% 96%   TempSrc:      BMI (Calculated):         Discharge exam  GENERAL: No apparent distress.  Nontoxic. HEENT: MMM.  Vision and hearing grossly intact.  NECK: Supple.  No apparent JVD.  RESP:   No IWOB.  Fair aeration bilaterally. CVS:  RRR. Heart sounds normal.  ABD/GI/GU: BS+. Abd soft, NTND.  MSK/EXT:  Moves extremities. No apparent deformity. No edema.  SKIN: Dressing over right hip DCI NEURO: Awake and alert. Oriented appropriately.  No apparent focal neuro deficit. PSYCH: Calm. Normal affect.   Discharge Instructions Discharge Instructions     Diet - low sodium heart healthy   Complete by: As directed    Discharge wound care:   Complete by: As directed    Reinforce dressing  as needed   Increase activity slowly   Complete by: As directed       Allergies as of 07/13/2022       Reactions   Lisinopril Cough   Niaspan [niacin Er] Rash        Medication List     STOP taking these medications    diazepam 5 MG tablet Commonly known as: VALIUM   HYDROcodone bit-homatropine 5-1.5 MG/5ML syrup Commonly known as: HYCODAN   Magnesium 250 MG Tabs       TAKE these medications    acetaminophen 500 MG tablet Commonly known as: TYLENOL Take 1 tablet (500 mg total) by mouth 2 (two) times daily as needed. What changed: reasons to take this   amLODipine 10 MG tablet Commonly known as: NORVASC Take 10 mg by mouth daily.   aspirin EC 81 MG tablet Take 1 tablet (81 mg total) by mouth 2 (two) times a day. What changed: when to take this   atenolol 50 MG tablet Commonly known as: TENORMIN Take 50 mg by mouth daily.   atorvastatin 40 MG tablet Commonly known as: LIPITOR Take 40 mg by mouth daily at 6 PM.   Biotin 10 MG Caps Take 10 mg by mouth daily.   cholecalciferol 25 MCG (1000 UNIT) tablet Commonly known as: VITAMIN D3 Take 1,000 Units by mouth daily.   clopidogrel 75 MG tablet Commonly known as: PLAVIX Take 75 mg by mouth every evening.   finasteride 5 MG tablet Commonly known as: PROSCAR Take 5 mg by mouth daily.   gabapentin 100 MG capsule Commonly known as: NEURONTIN Take 100 mg by mouth 3 (three) times daily.   Glucosamine  Chondroitin Triple Tabs Take 1 tablet by mouth 2 (two) times daily.   hydrALAZINE 10 MG tablet Commonly known as: APRESOLINE Take 1 tablet (10 mg total) by mouth 3 (three) times daily. What changed: when to take this   hydrochlorothiazide 12.5 MG capsule Commonly known as: MICROZIDE Take 2 capsules (25 mg total) by mouth daily. Start taking on: July 17, 2022 What changed: These instructions start on July 17, 2022. If you are unsure what to do until then, ask your doctor or other care provider.   iron polysaccharides 150 MG capsule Commonly known as: NIFEREX Take 150 mg by mouth every Monday, Wednesday, and Friday.   losartan 50 MG tablet Commonly known as: COZAAR Take 50 mg by mouth daily.   multivitamin with  minerals Tabs tablet Take 1 tablet by mouth daily.   oxyCODONE 5 MG immediate release tablet Commonly known as: Oxy IR/ROXICODONE Take 1 tablet (5 mg total) by mouth every 6 (six) hours as needed for severe pain.   pantoprazole 40 MG tablet Commonly known as: PROTONIX TAKE 1 TABLET BY MOUTH TWICE A DAY What changed: when to take this   potassium chloride SA 20 MEQ tablet Commonly known as: KLOR-CON M Take 20 mEq by mouth every evening.   PREVAGEN PO Take 1 tablet by mouth daily.   senna 8.6 MG Tabs tablet Commonly known as: SENOKOT Take 1 tablet (8.6 mg total) by mouth 2 (two) times daily.   tamsulosin 0.4 MG Caps capsule Commonly known as: FLOMAX Take 1 capsule (0.4 mg total) by mouth daily after supper.               Discharge Care Instructions  (From admission, onward)           Start     Ordered   07/13/22 0000  Discharge wound care:       Comments: Reinforce dressing  as needed   07/13/22 0740            Consultations: Orthopedic surgery  Procedures/Studies: 12/12-right THA   DG Pelvis Portable  Result Date: 07/11/2022 CLINICAL DATA:  Right hip replacement EXAM: PORTABLE PELVIS 1 VIEWS COMPARISON:  05/13/2022  FINDINGS: Right hip replacement in satisfactory position and alignment. No fracture or complication. Mild joint space narrowing left hip. IMPRESSION: Satisfactory right hip replacement without complication. Electronically Signed   By: Franchot Gallo M.D.   On: 07/11/2022 15:32   DG C-Arm 1-60 Min-No Report  Result Date: 07/11/2022 CLINICAL DATA:  Right hip replacement EXAM: DG HIP (WITH OR WITHOUT PELVIS) 1V RIGHT; DG C-ARM 1-60 MIN-NO REPORT COMPARISON:  07/09/2022 FINDINGS: Seven C-arm images were obtained of the right hip. Right hip replacement in satisfactory position and alignment. No acute complication. IMPRESSION: Satisfactory right hip replacement. Electronically Signed   By: Franchot Gallo M.D.   On: 07/11/2022 14:54   DG HIP UNILAT WITH PELVIS 1V RIGHT  Result Date: 07/11/2022 CLINICAL DATA:  Right hip replacement EXAM: DG HIP (WITH OR WITHOUT PELVIS) 1V RIGHT; DG C-ARM 1-60 MIN-NO REPORT COMPARISON:  07/09/2022 FINDINGS: Seven C-arm images were obtained of the right hip. Right hip replacement in satisfactory position and alignment. No acute complication. IMPRESSION: Satisfactory right hip replacement. Electronically Signed   By: Franchot Gallo M.D.   On: 07/11/2022 14:54   DG C-Arm 1-60 Min-No Report  Result Date: 07/11/2022 CLINICAL DATA:  Right hip replacement EXAM: DG HIP (WITH OR WITHOUT PELVIS) 1V RIGHT; DG C-ARM 1-60 MIN-NO REPORT COMPARISON:  07/09/2022 FINDINGS: Seven C-arm images were obtained of the right hip. Right hip replacement in satisfactory position and alignment. No acute complication. IMPRESSION: Satisfactory right hip replacement. Electronically Signed   By: Franchot Gallo M.D.   On: 07/11/2022 14:54   CT HEAD WO CONTRAST  Result Date: 07/09/2022 CLINICAL DATA:  [place] EXAM: CT HEAD WITHOUT CONTRAST TECHNIQUE: Contiguous axial images were obtained from the base of the skull through the vertex without intravenous contrast. RADIATION DOSE REDUCTION: This exam was  performed according to the departmental dose-optimization program which includes automated exposure control, adjustment of the mA and/or kV according to patient size and/or use of iterative reconstruction technique. COMPARISON:  05/02/2022 FINDINGS: Brain: Stable confluent hypodensities throughout the periventricular white matter consistent with chronic small vessel ischemic changes. Chronic lacunar infarcts  are again seen within the basal ganglia. No signs of acute infarct or hemorrhage. Lateral ventricles and remaining midline structures are unremarkable. No acute extra-axial fluid collections. No mass effect. Vascular: No hyperdense vessel or unexpected calcification. Skull: Small right parietal scalp hematoma. No underlying fracture. The remainder of the calvarium is unremarkable. Sinuses/Orbits: No acute finding. Other: None. IMPRESSION: 1. Small right parietal scalp hematoma.  No underlying fracture. 2. No acute intracranial process. Electronically Signed   By: Randa Ngo M.D.   On: 07/09/2022 15:30   DG Hip Unilat  With Pelvis 2-3 Views Right  Result Date: 07/09/2022 CLINICAL DATA:  Right hip pain after fall. EXAM: DG HIP (WITH OR WITHOUT PELVIS) 2-3V RIGHT COMPARISON:  None Available. FINDINGS: Signs of impacted subcapital femoral neck fracture identified involving the proximal right femur. Left hip appears intact. Mild bilateral hip osteoarthritis. Atherosclerotic calcifications identified. IMPRESSION: Impacted subcapital femoral neck fracture involving the proximal right femur. Electronically Signed   By: Kerby Moors M.D.   On: 07/09/2022 15:26   DG Chest 1 View  Result Date: 07/09/2022 CLINICAL DATA:  Status post mechanical fall with right hip pain. For preoperative evaluation. EXAM: CHEST  1 VIEW COMPARISON:  Chest radiograph dated 05/13/2022 FINDINGS: Normal lung volumes. No focal consolidations. Bibasilar linear opacities. No pleural effusion or pneumothorax. Similar cardiomediastinal  silhouette. The visualized skeletal structures are unremarkable. IMPRESSION: 1. No acute cardiopulmonary process. 2. Bibasilar atelectasis. Electronically Signed   By: Darrin Nipper M.D.   On: 07/09/2022 15:23       The results of significant diagnostics from this hospitalization (including imaging, microbiology, ancillary and laboratory) are listed below for reference.     Microbiology: Recent Results (from the past 240 hour(s))  Surgical PCR screen     Status: None   Collection Time: 07/10/22  9:20 AM   Specimen: Nasal Mucosa; Nasal Swab  Result Value Ref Range Status   MRSA, PCR NEGATIVE NEGATIVE Final   Staphylococcus aureus NEGATIVE NEGATIVE Final    Comment: (NOTE) The Xpert SA Assay (FDA approved for NASAL specimens in patients 65 years of age and older), is one component of a comprehensive surveillance program. It is not intended to diagnose infection nor to guide or monitor treatment. Performed at [facility], [place] [address] [place], [place] [address]      Labs:  CBC: Recent Labs  Lab 07/09/22 1450 07/11/22 0334 07/12/22 0329 07/13/22 0335  WBC 9.0 10.2 14.4* 16.0*  NEUTROABS 7.3  --   --   --   HGB 12.6* 12.2* 10.5* 9.6*  HCT 38.5* 37.6* 32.6* 30.0*  MCV 87.1 88.3 89.6 89.3  PLT 257 202 201 205   BMP &GFR Recent Labs  Lab 07/09/22 1450 07/11/22 0334 07/12/22 0329 07/12/22 0330 07/13/22 0335  NA 141 142 143 143 145  K 3.8 3.8 4.6 4.6 4.0  CL 108 108 110 110 115*  CO2 '25 26 25 24 25  '$ GLUCOSE 129* 129* 179* 181* 186*  BUN 15 16 26* 24* 33*  CREATININE 0.96 0.84 1.32* 1.34* 1.04  CALCIUM 9.0 8.7* 8.7* 8.7* 8.8*  MG  --  2.1 2.4  --  2.2  PHOS  --  3.3  --  3.4 2.8   Estimated Creatinine Clearance: 44.3 mL/min (by C-G formula based on SCr of 1.04 mg/dL). Liver & Pancreas: Recent Labs  Lab 07/11/22 0334 07/12/22 0330 07/13/22 0335  ALBUMIN 3.3* 2.7* 2.9*   No results for input(s): "LIPASE", "AMYLASE" in the last 168  hours. No  results for input(s): "AMMONIA" in the last 168 hours. Diabetic: No results for input(s): "HGBA1C" in the last 72 hours. No results for input(s): "GLUCAP" in the last 168 hours. Cardiac Enzymes: No results for input(s): "CKTOTAL", "CKMB", "CKMBINDEX", "TROPONINI" in the last 168 hours. No results for input(s): "PROBNP" in the last 8760 hours. Coagulation Profile: Recent Labs  Lab 07/09/22 1450  INR 0.9   Thyroid Function Tests: No results for input(s): "TSH", "T4TOTAL", "FREET4", "T3FREE", "THYROIDAB" in the last 72 hours. Lipid Profile: No results for input(s): "CHOL", "HDL", "LDLCALC", "TRIG", "CHOLHDL", "LDLDIRECT" in the last 72 hours. Anemia Panel: No results for input(s): "VITAMINB12", "FOLATE", "FERRITIN", "TIBC", "IRON", "RETICCTPCT" in the last 72 hours. Urine analysis:    Component Value Date/Time   COLORURINE YELLOW 05/14/2022 0846   APPEARANCEUR CLEAR 05/14/2022 0846   LABSPEC 1.019 05/14/2022 0846   PHURINE 5.0 05/14/2022 0846   GLUCOSEU NEGATIVE 05/14/2022 0846   HGBUR NEGATIVE 05/14/2022 0846   BILIRUBINUR NEGATIVE 05/14/2022 0846   KETONESUR 5 (A) 05/14/2022 0846   PROTEINUR NEGATIVE 05/14/2022 0846   UROBILINOGEN 0.2 10/28/2010 0909   NITRITE NEGATIVE 05/14/2022 0846   LEUKOCYTESUR NEGATIVE 05/14/2022 0846   Sepsis Labs: Invalid input(s): "PROCALCITONIN", "LACTICIDVEN"   SIGNED:  Mercy Riding, MD  [facility] 07/13/2022, 1:37 PM

## 2022-07-13 NOTE — TOC Transition Note (Signed)
Transition of Care [facility]) - CM/SW Discharge Note   Patient Details  Name: ROBERTSON COLCLOUGH MRN: [id number] Date of Birth: 20-Jan-1931  [facility]) CM/SW Contact:  Lennart Pall, LCSW Phone Number: 07/13/2022, 1:49 PM   Clinical Narrative:    Pt medically cleared for dc today to SNF bed at [facility].  Pt and wife aware/ agreeable.  PTAR called at 1:45pm.  RN to call report to [phone].  No further TOC needs.   Final next level of care: Skilled Nursing Facility Barriers to Discharge: Barriers Resolved   Patient Goals and CMS Choice Patient states their goals for this hospitalization and ongoing recovery are:: return to [facility] via rehab      Discharge Placement              Patient chooses bed at: [facility] Patient to be transferred to facility by: [facility] Name of family member notified: wife Patient and family notified of of transfer: 07/13/22  Discharge Plan and Services In-house Referral: Clinical Social Work   Post Acute Care Choice: [facility]          DME Arranged: N/A DME Agency: NA                  Social Determinants of Health (SDOH) Interventions Food Insecurity Interventions: Intervention Not Indicated Housing Interventions: Intervention Not Indicated Transportation Interventions: Intervention Not Indicated Utilities Interventions: Intervention Not Indicated   Readmission Risk Interventions    07/10/2022    2:59 PM  Readmission Risk Prevention Plan  Post Dischage Appt Complete  Medication Screening Complete  Transportation Screening Complete

## 2022-07-13 NOTE — Plan of Care (Signed)

## 2022-07-13 NOTE — Plan of Care (Signed)

## 2022-07-14 ENCOUNTER — Encounter: Payer: Self-pay | Admitting: Adult Health

## 2022-07-14 ENCOUNTER — Non-Acute Institutional Stay (SKILLED_NURSING_FACILITY): Payer: Medicare Other | Admitting: Adult Health

## 2022-07-14 DIAGNOSIS — I2510 Atherosclerotic heart disease of native coronary artery without angina pectoris: Secondary | ICD-10-CM

## 2022-07-14 DIAGNOSIS — K279 Peptic ulcer, site unspecified, unspecified as acute or chronic, without hemorrhage or perforation: Secondary | ICD-10-CM

## 2022-07-14 DIAGNOSIS — I10 Essential (primary) hypertension: Secondary | ICD-10-CM

## 2022-07-14 DIAGNOSIS — S72001A Fracture of unspecified part of neck of right femur, initial encounter for closed fracture: Secondary | ICD-10-CM | POA: Diagnosis not present

## 2022-07-14 DIAGNOSIS — D509 Iron deficiency anemia, unspecified: Secondary | ICD-10-CM

## 2022-07-14 DIAGNOSIS — R053 Chronic cough: Secondary | ICD-10-CM

## 2022-07-14 DIAGNOSIS — N179 Acute kidney failure, unspecified: Secondary | ICD-10-CM | POA: Diagnosis not present

## 2022-07-14 DIAGNOSIS — E782 Mixed hyperlipidemia: Secondary | ICD-10-CM

## 2022-07-14 MED ORDER — HYDRALAZINE HCL 25 MG PO TABS: 25.0000 mg | ORAL_TABLET | Freq: Three times a day (TID) | ORAL | 0 refills | Status: DC | PRN

## 2022-07-14 MED ORDER — POLYETHYLENE GLYCOL 3350 17 G PO PACK: 17.0000 g | PACK | Freq: Every day | ORAL | 0 refills | Status: DC | PRN

## 2022-07-14 NOTE — Progress Notes (Signed)
This encounter was created in error - please disregard.

## 2022-07-14 NOTE — Progress Notes (Signed)
Location:  [facility]upational psychologist of Service:  SNF (31) Provider:  Royal Hawthorn, NP  [name], MD  Patient Care Team: [name], MD as PCP - General (Endocrinology) Lorretta Harp, MD as PCP - Cardiology (Cardiology)  Extended Emergency Contact Information Primary Emergency Contact: Marjie Skiff Address: [address]          [place], [place] [address] Johnnette Litter of [place] Phone: [phone] Mobile Phone: [phone] Relation: Spouse  Code Status:  DNR Goals of care: Advanced Directive information    07/14/2022   11:12 AM  Advanced Directives  Does Patient Have a Medical Advance Directive? Yes  Type of Paramedic of [place];Out of facility DNR (pink MOST or yellow form)  Does patient want to make changes to medical advance directive? No - Patient declined  Copy of [place] in Chart? Yes - validated most recent copy scanned in chart (See row information)     Chief Complaint  Patient presents with   Acute Visit    Hospitalization    HPI:  Pt is a 88 y.o. male seen today for follow up s/p hospitalization 07/09/22-07/13/22 with PMH of CAD, PAD followed with Dr Gwenlyn Found IDA, GERD, HTN, PVD and HLD presenting with mechanical fall and subsequent severe right hip pain and inability to bear weight, and admitted for impacted subcapital femoral neck fracture. He underwent right THA by Dr. Alvan Dame on 07/11/2022.  He had AKI with BUN/Cr 24/1.34 on 12/13. Repeat 33/1.04 on discharge. During his stay and HCTZ was held til 12/18 Hydralazine was increased to tid due to HTN BP 12/15 for my visit is 209/83 after morning meds. He denies any headaches, dizziness, sob, etc. Reports recurrent bp issues.   He is here in skilled care at [facility] for therapy. Pain to right hip is 4/10.  LBM 2 days ago per patient He has a hx of urinary retention but reports being able to urinate ok for now. Hx of  hydronephrosis and stent.  Has some memory loss. [place] for [place] in [place].  Has lumbar stenosis and hx of leg weakness was in rehab in Oct with this issue   Past Medical History:  Diagnosis Date   AAA (abdominal aortic aneurysm) ([facility])    asymptomatic   AAA (abdominal aortic aneurysm) ([place]) 2010   peripheral  angiogram-- bilateral SFA DISEASE  and 60 to 70% infrarenaal abd. aortic stenosis with 15 -mm gradient   Adenomatous colon polyp 11/2003   CAD (coronary artery disease)    Gait abnormality 02/13/2017   Gastroparesis    pt denies   GERD (gastroesophageal reflux disease)    w/ LPR   History of kidney stones    x2   Hyperlipidemia    Hypertension    IDA (iron deficiency anemia)    Peripheral arterial disease ([place])    RCEA  by Dr Lemar Livings   PUD (peptic ulcer disease)    pt unaware   Vertigo    Past Surgical History:  Procedure Laterality Date   ABDOMINAL AORTOGRAM W/LOWER EXTREMITY Bilateral 08/05/2018   Procedure: ABDOMINAL AORTOGRAM W/LOWER EXTREMITY;  Surgeon: Lorretta Harp, MD;  Location: [facility];  Service: Cardiovascular;  Laterality: Bilateral;   ABDOMINAL AORTOGRAM W/LOWER EXTREMITY N/A 09/26/2021   Procedure: ABDOMINAL AORTOGRAM W/LOWER EXTREMITY;  Surgeon: Lorretta Harp, MD;  Location: [facility];  Service: Cardiovascular;  Laterality: N/A;   AORTOGRAM  04/24/2016  Abdominal aortogram, bilateral iliac angiogram, bifemoral runoff   CARDIAC CATHETERIZATION  05/12/2005   RCA   carotid doppler  01/24/2013   RICA endarterectomy,left CCA 0-49%; left bulb and prox ICA 50-69%; bilaateral subclavian < 50%   CAROTID ENDARTERECTOMY  11/01/2010   CATARACT EXTRACTION     COLONOSCOPY     CORONARY ANGIOPLASTY  05/18/2005   2 STENTS distal RCA AND PROXIMAL-MID RCA   5 total stents per pt.   CYSTOSCOPY/URETEROSCOPY/HOLMIUM LASER/STENT PLACEMENT Left 01/11/2018   Procedure: LEFT URETEROSCOPY/HOLMIUM LASER/STENT PLACEMENT;  Surgeon: Ardis Hughs, MD;  Location: [facility];  Service: Urology;  Laterality: Left;   DOPPLER ECHOCARDIOGRAPHY  02/07/2012   EF 55%,SHOWED NO ISCHEMIA    HERNIA REPAIR     umbilical   lower arterial  doppler  02/04/2013   aotra 1.5 x 1.5 cm; distal abd aorta 70-99%,proximal common iliac arteries -very stenotic with increased velocities>50%,may be falsely elevated as a result of residual plaque from the distal aorta stenosis   lower extremity doppler  June 18 ,2013   ABI'S ABNORMAL, RABI was 0.88 and LABI 0.75 ,with 3-vessel  run off   NM MYOVIEW LTD  MAY 23,2011   showed no significant ischemia;   NM MYOVIEW LTD  04/22/2008   ef 77%,exercise capcity 6 METS ,exaggerated blood pressure response to exercise   PERIPHERAL VASCULAR CATHETERIZATION N/A 04/24/2016   Procedure: Lower Extremity Angiography;  Surgeon: Lorretta Harp, MD;  Location: [facility];  Service: Cardiovascular;  Laterality: N/A;   PERIPHERAL VASCULAR CATHETERIZATION  04/24/2016   Procedure: Peripheral Vascular Intervention;  Surgeon: Lorretta Harp, MD;  Location: [facility];  Service: Cardiovascular;;  Aorta   retrograde central aortic catheterization  05/19/2005   cutting balloon atherectomy, c-circ stenosis with DES STENTING CYPHER   TONSILLECTOMY     TOTAL HIP ARTHROPLASTY Right 07/11/2022   Procedure: TOTAL HIP ARTHROPLASTY ANTERIOR APPROACH;  Surgeon: Paralee Cancel, MD;  Location: [facility];  Service: Orthopedics;  Laterality: Right;   TOTAL KNEE ARTHROPLASTY Left 01/03/2019   Procedure: TOTAL KNEE ARTHROPLASTY;  Surgeon: Netta Cedars, MD;  Location: [facility];  Service: Orthopedics;  Laterality: Left;  with IS block   TRANSURETHRAL RESECTION OF PROSTATE N/A 09/08/2016   Procedure: TRANSURETHRAL RESECTION OF THE PROSTATE (TURP);  Surgeon: Ardis Hughs, MD;  [facility];  Service: Urology;  Laterality: N/A;   TRANSURETHRAL RESECTION OF PROSTATE     10-10-17  Dr. Louis Meckel   TRANSURETHRAL RESECTION OF PROSTATE N/A  10/10/2017   Procedure: TRANSURETHRAL RESECTION OF THE PROSTATE (TURP);  Surgeon: Ardis Hughs, MD;  [facility];  Service: Urology;  Laterality: N/A;   VASECTOMY      Allergies  Allergen Reactions   Lisinopril Cough   Niaspan [Niacin Er] Rash    Outpatient Encounter Medications as of 07/14/2022  Medication Sig   acetaminophen (TYLENOL) 500 MG tablet Take 1 tablet (500 mg total) by mouth 2 (two) times daily as needed. (Patient taking differently: Take 500 mg by mouth 2 (two) times daily as needed for mild pain.)   amLODipine (NORVASC) 10 MG tablet Take 10 mg by mouth daily.   Apoaequorin (PREVAGEN PO) Take 1 tablet by mouth daily.   aspirin EC 81 MG tablet Take 1 tablet (81 mg total) by mouth 2 (two) times a day. (Patient taking differently: Take 81 mg by mouth daily.)   atenolol (TENORMIN) 50 MG tablet Take 50 mg by mouth daily.   atorvastatin (LIPITOR) 40  MG tablet Take 40 mg by mouth daily at 6 PM.    Biotin 10 MG CAPS Take 10 mg by mouth daily.   cholecalciferol (VITAMIN D3) 25 MCG (1000 UT) tablet Take 1,000 Units by mouth daily.   clopidogrel (PLAVIX) 75 MG tablet Take 75 mg by mouth every evening.    finasteride (PROSCAR) 5 MG tablet Take 5 mg by mouth daily.   gabapentin (NEURONTIN) 100 MG capsule Take 100 mg by mouth 3 (three) times daily.   hydrALAZINE (APRESOLINE) 10 MG tablet Take 1 tablet (10 mg total) by mouth 3 (three) times daily.   [START ON 07/17/2022] hydrochlorothiazide (MICROZIDE) 12.5 MG capsule Take 2 capsules (25 mg total) by mouth daily.   iron polysaccharides (NIFEREX) 150 MG capsule Take 150 mg by mouth every Monday, Wednesday, and Friday.   losartan (COZAAR) 50 MG tablet Take 50 mg by mouth daily.   Misc Natural Products (GLUCOSAMINE CHONDROITIN TRIPLE) TABS Take 1 tablet by mouth 2 (two) times daily.   Multiple Vitamin (MULTIVITAMIN WITH MINERALS) TABS tablet Take 1 tablet by mouth daily.   oxyCODONE (OXY IR/ROXICODONE) 5 MG immediate release  tablet Take 1 tablet (5 mg total) by mouth every 6 (six) hours as needed for severe pain.   pantoprazole (PROTONIX) 40 MG tablet TAKE 1 TABLET BY MOUTH TWICE A DAY (Patient taking differently: Take 40 mg by mouth at bedtime.)   potassium chloride SA (K-DUR,KLOR-CON) 20 MEQ tablet Take 20 mEq by mouth every evening.    senna (SENOKOT) 8.6 MG TABS tablet Take 1 tablet (8.6 mg total) by mouth 2 (two) times daily.   tamsulosin (FLOMAX) 0.4 MG CAPS capsule Take 1 capsule (0.4 mg total) by mouth daily after supper.   No facility-administered encounter medications on file as of 07/14/2022.    Review of Systems  Constitutional:  Positive for activity change. Negative for appetite change, chills, diaphoresis, fatigue, fever and unexpected weight change.  HENT:  Negative for congestion.   Eyes:  Negative for visual disturbance.  Respiratory:  Negative for cough, shortness of breath, wheezing and stridor.   Cardiovascular:  Negative for chest pain, palpitations and leg swelling.  Gastrointestinal:  Negative for abdominal distention, abdominal pain, constipation and diarrhea.  Genitourinary:  Negative for difficulty urinating and dysuria.  Musculoskeletal:  Positive for arthralgias and gait problem. Negative for back pain, joint swelling and myalgias.  Neurological:  Negative for dizziness, seizures, syncope, facial asymmetry, speech difficulty, weakness and headaches.  Hematological:  Negative for adenopathy. Does not bruise/bleed easily.  Psychiatric/Behavioral:  Negative for agitation, behavioral problems and confusion.     Immunization History  Administered Date(s) Administered   Fluad Quad(high Dose 65+) 05/02/2022   Influenza, High Dose Seasonal PF 03/31/2017, 03/29/2018   Influenza,inj,Quad PF,6+ Mos 04/25/2016, 05/06/2018   Influenza,inj,quad, With Preservative 05/26/2019   Influenza-Unspecified 05/14/2001, 05/31/2001, 05/14/2002, 05/15/2002, 05/18/2003, 05/25/2004, 04/30/2005, 05/29/2006,  05/01/2007, 05/11/2008, 05/31/2009, 05/31/2010, 04/30/2013, 05/31/2014, 03/31/2021   Moderna Covid-19 Vaccine Bivalent Booster 47yr & up 08/31/2021   Moderna SARS-COV2 Booster Vaccination 06/15/2020, 11/19/2020, 01/02/2022   Moderna Sars-Covid-2 Vaccination 08/12/2019, 09/10/2019, 05/31/2022   Pneumococcal Polysaccharide-23 05/02/2000, 09/24/2018   Td 09/08/2013   Pertinent  Health Maintenance Due  Topic Date Due   INFLUENZA VACCINE  Completed      07/11/2022    7:36 AM 07/11/2022    7:25 PM 07/12/2022    9:20 AM 07/12/2022    7:10 PM 07/13/2022    7:10 AM  Fall Risk  Patient Fall Risk Level High fall  risk High fall risk High fall risk High fall risk High fall risk   Functional Status Survey:    Vitals:   07/14/22 1110 07/14/22 1138  BP: (!) 209/83 (!) 183/80  Pulse: 71   Resp: 17   Temp: 98 F (36.7 C)   TempSrc: Temporal   SpO2: 93%   Weight: 160 lb 12.8 oz (72.9 kg)   Height: '5\' 4"'$  (1.626 m)    Body mass index is 27.6 kg/m. Physical Exam Vitals and nursing note reviewed.  Constitutional:      General: He is not in acute distress.    Appearance: He is not diaphoretic.  HENT:     Head: Normocephalic and atraumatic.  Neck:     Thyroid: No thyromegaly.     Vascular: No JVD.     Trachea: No tracheal deviation.  Cardiovascular:     Rate and Rhythm: Normal rate and regular rhythm.     Pulses:          Dorsalis pedis pulses are 1+ on the right side and 1+ on the left side.     Heart sounds: No murmur heard. Pulmonary:     Effort: Pulmonary effort is normal. No respiratory distress.     Breath sounds: Normal breath sounds. No wheezing.  Abdominal:     General: Bowel sounds are normal. There is no distension.     Palpations: Abdomen is soft.     Tenderness: There is no abdominal tenderness.  Musculoskeletal:     Right lower leg: No edema.     Left lower leg: No edema.  Lymphadenopathy:     Cervical: No cervical adenopathy.  Skin:    General: Skin is warm  and dry.     Comments: Right hip dressing CDI, surrounding area with mild edema. NO erythema or drainage.   Neurological:     Mental Status: He is alert and oriented to person, place, and time.  Psychiatric:        Mood and Affect: Mood normal.     Labs reviewed: Recent Labs    07/11/22 0334 07/12/22 0329 07/12/22 0330 07/13/22 0335  NA 142 143 143 145  K 3.8 4.6 4.6 4.0  CL 108 110 110 115*  CO2 '26 25 24 25  '$ GLUCOSE 129* 179* 181* 186*  BUN 16 26* 24* 33*  CREATININE 0.84 1.32* 1.34* 1.04  CALCIUM 8.7* 8.7* 8.7* 8.8*  MG 2.1 2.4  --  2.2  PHOS 3.3  --  3.4 2.8   Recent Labs    05/13/22 1510 05/13/22 2334 05/14/22 0452 07/11/22 0334 07/12/22 0330 07/13/22 0335  AST '22 17 19  '$ --   --   --   ALT '21 22 21  '$ --   --   --   ALKPHOS 70 66 64  --   --   --   BILITOT 0.7 0.9 0.8  --   --   --   PROT 6.7 6.3* 6.1*  --   --   --   ALBUMIN 3.5 3.3* 3.2* 3.3* 2.7* 2.9*   Recent Labs    05/15/22 0613 05/16/22 0110 07/09/22 1450 07/11/22 0334 07/12/22 0329 07/13/22 0335  WBC 7.3 8.7 9.0 10.2 14.4* 16.0*  NEUTROABS 5.0 6.4 7.3  --   --   --   HGB 13.5 12.6* 12.6* 12.2* 10.5* 9.6*  HCT 40.6 38.4* 38.5* 37.6* 32.6* 30.0*  MCV 83.2 83.3 87.1 88.3 89.6 89.3  PLT 195 189 257 202 201 205  Lab Results  Component Value Date   TSH 1.104 05/13/2022   Lab Results  Component Value Date   HGBA1C 5.5 11/23/2016   Lab Results  Component Value Date   CHOL 108 11/23/2016   HDL 45 11/23/2016   LDLCALC 44 11/23/2016   TRIG 94 11/23/2016   CHOLHDL 2.4 11/23/2016    Significant Diagnostic Results in last 30 days:  DG Pelvis Portable  Result Date: 07/11/2022 CLINICAL DATA:  Right hip replacement EXAM: PORTABLE PELVIS 1 VIEWS COMPARISON:  05/13/2022 FINDINGS: Right hip replacement in satisfactory position and alignment. No fracture or complication. Mild joint space narrowing left hip. IMPRESSION: Satisfactory right hip replacement without complication. Electronically Signed    By: Franchot Gallo M.D.   On: 07/11/2022 15:32   DG C-Arm 1-60 Min-No Report  Result Date: 07/11/2022 CLINICAL DATA:  Right hip replacement EXAM: DG HIP (WITH OR WITHOUT PELVIS) 1V RIGHT; DG C-ARM 1-60 MIN-NO REPORT COMPARISON:  07/09/2022 FINDINGS: Seven C-arm images were obtained of the right hip. Right hip replacement in satisfactory position and alignment. No acute complication. IMPRESSION: Satisfactory right hip replacement. Electronically Signed   By: Franchot Gallo M.D.   On: 07/11/2022 14:54   DG HIP UNILAT WITH PELVIS 1V RIGHT  Result Date: 07/11/2022 CLINICAL DATA:  Right hip replacement EXAM: DG HIP (WITH OR WITHOUT PELVIS) 1V RIGHT; DG C-ARM 1-60 MIN-NO REPORT COMPARISON:  07/09/2022 FINDINGS: Seven C-arm images were obtained of the right hip. Right hip replacement in satisfactory position and alignment. No acute complication. IMPRESSION: Satisfactory right hip replacement. Electronically Signed   By: Franchot Gallo M.D.   On: 07/11/2022 14:54   DG C-Arm 1-60 Min-No Report  Result Date: 07/11/2022 CLINICAL DATA:  Right hip replacement EXAM: DG HIP (WITH OR WITHOUT PELVIS) 1V RIGHT; DG C-ARM 1-60 MIN-NO REPORT COMPARISON:  07/09/2022 FINDINGS: Seven C-arm images were obtained of the right hip. Right hip replacement in satisfactory position and alignment. No acute complication. IMPRESSION: Satisfactory right hip replacement. Electronically Signed   By: Franchot Gallo M.D.   On: 07/11/2022 14:54   CT HEAD WO CONTRAST  Result Date: 07/09/2022 CLINICAL DATA:  [place] EXAM: CT HEAD WITHOUT CONTRAST TECHNIQUE: Contiguous axial images were obtained from the base of the skull through the vertex without intravenous contrast. RADIATION DOSE REDUCTION: This exam was performed according to the departmental dose-optimization program which includes automated exposure control, adjustment of the mA and/or kV according to patient size and/or use of iterative reconstruction technique. COMPARISON:   05/02/2022 FINDINGS: Brain: Stable confluent hypodensities throughout the periventricular white matter consistent with chronic small vessel ischemic changes. Chronic lacunar infarcts are again seen within the basal ganglia. No signs of acute infarct or hemorrhage. Lateral ventricles and remaining midline structures are unremarkable. No acute extra-axial fluid collections. No mass effect. Vascular: No hyperdense vessel or unexpected calcification. Skull: Small right parietal scalp hematoma. No underlying fracture. The remainder of the calvarium is unremarkable. Sinuses/Orbits: No acute finding. Other: None. IMPRESSION: 1. Small right parietal scalp hematoma.  No underlying fracture. 2. No acute intracranial process. Electronically Signed   By: Randa Ngo M.D.   On: 07/09/2022 15:30   DG Hip Unilat  With Pelvis 2-3 Views Right  Result Date: 07/09/2022 CLINICAL DATA:  Right hip pain after fall. EXAM: DG HIP (WITH OR WITHOUT PELVIS) 2-3V RIGHT COMPARISON:  None Available. FINDINGS: Signs of impacted subcapital femoral neck fracture identified involving the proximal right femur. Left hip appears intact. Mild bilateral hip osteoarthritis. Atherosclerotic calcifications identified. IMPRESSION:  Impacted subcapital femoral neck fracture involving the proximal right femur. Electronically Signed   By: Kerby Moors M.D.   On: 07/09/2022 15:26   DG Chest 1 View  Result Date: 07/09/2022 CLINICAL DATA:  Status post mechanical fall with right hip pain. For preoperative evaluation. EXAM: CHEST  1 VIEW COMPARISON:  Chest radiograph dated 05/13/2022 FINDINGS: Normal lung volumes. No focal consolidations. Bibasilar linear opacities. No pleural effusion or pneumothorax. Similar cardiomediastinal silhouette. The visualized skeletal structures are unremarkable. IMPRESSION: 1. No acute cardiopulmonary process. 2. Bibasilar atelectasis. Electronically Signed   By: Darrin Nipper M.D.   On: 07/09/2022 15:23     Assessment/Plan  1. Closed fracture of neck of right femur, initial encounter ([facility]) S/p repair IN rehab for therapy WBAT Pain control with oxycodone Add miralax for constipation prn   2. AKI (acute kidney injury) ([place]) Improving HCTZ on hold BMP next week   3. Essential hypertension Uncontrolled.  Add hydralazine 25 mg tid prn SBP >180 HCTZ will be added back 12/18 Continue to monitor and f/u next week   4. Peptic ulcer With hx of GERD On protonix   5. Iron deficiency anemia, unspecified iron deficiency anemia type Lab Results  Component Value Date   HGB 9.6 (L) 07/13/2022  On iron CBC next week   6. Mixed hyperlipidemia Lab Results  Component Value Date   LDLCALC 44 11/23/2016   On lipitor, may have had another lipid panel at PCP  7. Coronary artery disease involving native coronary artery of native heart without angina pectoris On asa and plavix and statin  8. Chronic cough On Hycodan prn chroincally  9. MCI Needs MMSE   Family/ staff Communication: nurse  Labs/tests ordered:  CBC and BMP next week

## 2022-07-17 ENCOUNTER — Non-Acute Institutional Stay (SKILLED_NURSING_FACILITY): Payer: Medicare Other | Admitting: Internal Medicine

## 2022-07-17 ENCOUNTER — Telehealth: Payer: Self-pay | Admitting: Adult Health

## 2022-07-17 ENCOUNTER — Encounter: Payer: Self-pay | Admitting: Internal Medicine

## 2022-07-17 DIAGNOSIS — G3184 Mild cognitive impairment, so stated: Secondary | ICD-10-CM

## 2022-07-17 DIAGNOSIS — S72001S Fracture of unspecified part of neck of right femur, sequela: Secondary | ICD-10-CM

## 2022-07-17 DIAGNOSIS — D509 Iron deficiency anemia, unspecified: Secondary | ICD-10-CM

## 2022-07-17 DIAGNOSIS — E782 Mixed hyperlipidemia: Secondary | ICD-10-CM

## 2022-07-17 DIAGNOSIS — N179 Acute kidney failure, unspecified: Secondary | ICD-10-CM

## 2022-07-17 DIAGNOSIS — I10 Essential (primary) hypertension: Secondary | ICD-10-CM

## 2022-07-17 DIAGNOSIS — R053 Chronic cough: Secondary | ICD-10-CM

## 2022-07-17 MED ORDER — HYDROCODONE BIT-HOMATROP MBR 5-1.5 MG/5ML PO SOLN: 5.0000 mL | Freq: Four times a day (QID) | ORAL | 0 refills | Status: DC | PRN

## 2022-07-17 NOTE — Telephone Encounter (Signed)
Nurse request refill for hycodan syrup which pt takes for chronic cough. Can use in place of oxycodone for pain but not together.

## 2022-07-17 NOTE — Progress Notes (Unsigned)
Provider:   Location:  Occupational psychologist of Service:  SNF (31)  PCP: [name], MD Patient Care Team: [name], MD as PCP - General (Endocrinology) Lorretta Harp, MD as PCP - Cardiology (Cardiology)  Extended Emergency Contact Information Primary Emergency Contact: Marjie Skiff Address: [address]          [place], [place] [address] Johnnette Litter of [place] Phone: [phone] Mobile Phone: [phone] Relation: Spouse  Code Status: DNR Goals of Care: Advanced Directive information    07/17/2022    9:54 AM  Advanced Directives  Does Patient Have a Medical Advance Directive? Yes  Type of Paramedic of [place];Out of facility DNR (pink MOST or yellow form)  Does patient want to make changes to medical advance directive? No - Patient declined  Copy of [place] in Chart? Yes - validated most recent copy scanned in chart (See row information)      Chief Complaint  Patient presents with   New Admit To SNF    Patient is a new admission to SNF   Immunizations    Dscussed the need for shingles vaccine, AWV, and pneumonia vaccine    HPI: Patient is a 88 y.o. male seen today for admission to SNF   Is here for Rehab  Admitted in the hospital from 12/10-12/14 for Right Femur Fracture   Patient has h/o hypertension, hyperlipidemia, PAD, CAD,BPH   He fell at home when slipped from his chair. Denies any Dizziness Was found to have Right Hip Fracture  Underwent Right THA by Dr Alvan Dame on 07/11/2022  Has AKI post op . HCTZ was held.  Creat back to normal He is now in Rehab His main issue is High BP This morning SBP was more then 200 and he got Prn Hydralazine Denies any Dizzines SOB and  chest pain Does have pain in his Hip with any Movement Has not started to work with therapy yet due to high BP  Past Medical History:  Diagnosis Date   AAA (abdominal aortic aneurysm) (HCC)     asymptomatic   AAA (abdominal aortic aneurysm) ([place]) 2010   peripheral  angiogram-- bilateral SFA DISEASE  and 60 to 70% infrarenaal abd. aortic stenosis with 15 -mm gradient   Adenomatous colon polyp 11/2003   CAD (coronary artery disease)    Gait abnormality 02/13/2017   Gastroparesis    pt denies   GERD (gastroesophageal reflux disease)    w/ LPR   History of kidney stones    x2   Hyperlipidemia    Hypertension    IDA (iron deficiency anemia)    Peripheral arterial disease ([place])    RCEA  by Dr Lemar Livings   PUD (peptic ulcer disease)    pt unaware   Vertigo    Past Surgical History:  Procedure Laterality Date   ABDOMINAL AORTOGRAM W/LOWER EXTREMITY Bilateral 08/05/2018   Procedure: ABDOMINAL AORTOGRAM W/LOWER EXTREMITY;  Surgeon: Lorretta Harp, MD;  Location: [facility];  Service: Cardiovascular;  Laterality: Bilateral;   ABDOMINAL AORTOGRAM W/LOWER EXTREMITY N/A 09/26/2021   Procedure: ABDOMINAL AORTOGRAM W/LOWER EXTREMITY;  Surgeon: Lorretta Harp, MD;  Location: [facility];  Service: Cardiovascular;  Laterality: N/A;   AORTOGRAM  04/24/2016    Abdominal aortogram, bilateral iliac angiogram, bifemoral runoff   CARDIAC CATHETERIZATION  05/12/2005   RCA   carotid doppler  01/24/2013   RICA endarterectomy,left CCA 0-49%; left bulb and prox  ICA 50-69%; bilaateral subclavian < 50%   CAROTID ENDARTERECTOMY  11/01/2010   CATARACT EXTRACTION     COLONOSCOPY     CORONARY ANGIOPLASTY  05/18/2005   2 STENTS distal RCA AND PROXIMAL-MID RCA   5 total stents per pt.   CYSTOSCOPY/URETEROSCOPY/HOLMIUM LASER/STENT PLACEMENT Left 01/11/2018   Procedure: LEFT URETEROSCOPY/HOLMIUM LASER/STENT PLACEMENT;  Surgeon: Ardis Hughs, MD;  Location: [facility];  Service: Urology;  Laterality: Left;   DOPPLER ECHOCARDIOGRAPHY  02/07/2012   EF 55%,SHOWED NO ISCHEMIA    HERNIA REPAIR     umbilical   lower arterial  doppler  02/04/2013   aotra 1.5 x 1.5 cm; distal abd aorta  70-99%,proximal common iliac arteries -very stenotic with increased velocities>50%,may be falsely elevated as a result of residual plaque from the distal aorta stenosis   lower extremity doppler  June 18 ,2013   ABI'S ABNORMAL, RABI was 0.88 and LABI 0.75 ,with 3-vessel  run off   NM MYOVIEW LTD  MAY 23,2011   showed no significant ischemia;   NM MYOVIEW LTD  04/22/2008   ef 77%,exercise capcity 6 METS ,exaggerated blood pressure response to exercise   PERIPHERAL VASCULAR CATHETERIZATION N/A 04/24/2016   Procedure: Lower Extremity Angiography;  Surgeon: Lorretta Harp, MD;  Location: [facility];  Service: Cardiovascular;  Laterality: N/A;   PERIPHERAL VASCULAR CATHETERIZATION  04/24/2016   Procedure: Peripheral Vascular Intervention;  Surgeon: Lorretta Harp, MD;  Location: [facility];  Service: Cardiovascular;;  Aorta   retrograde central aortic catheterization  05/19/2005   cutting balloon atherectomy, c-circ stenosis with DES STENTING CYPHER   TONSILLECTOMY     TOTAL HIP ARTHROPLASTY Right 07/11/2022   Procedure: TOTAL HIP ARTHROPLASTY ANTERIOR APPROACH;  Surgeon: Paralee Cancel, MD;  Location: [facility];  Service: Orthopedics;  Laterality: Right;   TOTAL KNEE ARTHROPLASTY Left 01/03/2019   Procedure: TOTAL KNEE ARTHROPLASTY;  Surgeon: Netta Cedars, MD;  Location: [facility];  Service: Orthopedics;  Laterality: Left;  with IS block   TRANSURETHRAL RESECTION OF PROSTATE N/A 09/08/2016   Procedure: TRANSURETHRAL RESECTION OF THE PROSTATE (TURP);  Surgeon: Ardis Hughs, MD;  Location: [facility];  Service: Urology;  Laterality: N/A;   TRANSURETHRAL RESECTION OF PROSTATE     10-10-17  Dr. Louis Meckel   TRANSURETHRAL RESECTION OF PROSTATE N/A 10/10/2017   Procedure: TRANSURETHRAL RESECTION OF THE PROSTATE (TURP);  Surgeon: Ardis Hughs, MD;  Location[facility];  Service: Urology;  Laterality: N/A;   VASECTOMY      reports that he quit smoking about 53 years ago. His smoking use  included cigarettes. He has a 40.00 pack-year smoking history. He has never used smokeless tobacco. He reports that he does not currently use alcohol after a past usage of about 4.0 standard drinks of alcohol per week. He reports that he does not use drugs. Social History   Socioeconomic History   Marital status: Married    Spouse name: [name]   Number of children: 2   Years of education: BA   Highest education level: Not on file  Occupational History   Occupation: Retired  Tobacco Use   Smoking status: Former    Packs/day: 2.00    Years: 20.00    Total pack years: 40.00    Types: Cigarettes    Quit date: 07/31/1968    Years since quitting: 53.9   Smokeless tobacco: Never  Vaping Use   Vaping Use: Never used  Substance and Sexual Activity   Alcohol use:  Not Currently    Alcohol/week: 4.0 standard drinks of alcohol    Types: 4 Glasses of wine per week   Drug use: Never   Sexual activity: Not Currently  Other Topics Concern   Not on file  Social History Narrative   Lives with wife   Caffeine use: Decaf coffee, tea   Left handed   Social Determinants of Health   Financial Resource Strain: Not on file  Food Insecurity: No Food Insecurity (07/09/2022)   Hunger Vital Sign    Worried About Running Out of Food in the Last Year: Never true    Ran Out of Food in the Last Year: Never true  Transportation Needs: No Transportation Needs (07/09/2022)   PRAPARE - Hydrologist (Medical): No    Lack of Transportation (Non-Medical): No  Physical Activity: Not on file  Stress: Not on file  Social Connections: Not on file  Intimate Partner Violence: Not At Risk (07/09/2022)   Humiliation, Afraid, Rape, and Kick questionnaire    Fear of Current or Ex-Partner: No    Emotionally Abused: No    Physically Abused: No    Sexually Abused: No    Functional Status Survey:    Family History  Problem Relation Age of Onset   Ovarian cancer Sister    Heart  disease Father    Brain cancer Brother        Tumor    Health Maintenance  Topic Date Due   Zoster Vaccines- Shingrix (1 of 2) Never done   Medicare Annual Wellness (AWV)  09/18/2018   Pneumonia Vaccine 79+ Years old (2 - PCV) 09/25/2019   COVID-19 Vaccine (8 - 2023-24 season) 07/26/2022   DTaP/Tdap/Td (2 - Tdap) 09/09/2023   INFLUENZA VACCINE  Completed   HPV VACCINES  Aged Out    Allergies  Allergen Reactions   Lisinopril Cough   Niaspan [Niacin Er] Rash    Outpatient Encounter Medications as of 07/17/2022  Medication Sig   acetaminophen (TYLENOL) 500 MG tablet Take 1 tablet (500 mg total) by mouth 2 (two) times daily as needed. (Patient taking differently: Take 500 mg by mouth 2 (two) times daily as needed for mild pain.)   amLODipine (NORVASC) 10 MG tablet Take 10 mg by mouth daily.   Apoaequorin (PREVAGEN PO) Take 1 tablet by mouth daily.   aspirin EC 81 MG tablet Take 1 tablet (81 mg total) by mouth 2 (two) times a day. (Patient taking differently: Take 81 mg by mouth daily.)   atenolol (TENORMIN) 50 MG tablet Take 50 mg by mouth daily.   atorvastatin (LIPITOR) 40 MG tablet Take 40 mg by mouth daily at 6 PM.    Biotin 10 MG CAPS Take 10 mg by mouth daily.   cholecalciferol (VITAMIN D3) 25 MCG (1000 UT) tablet Take 1,000 Units by mouth daily.   clopidogrel (PLAVIX) 75 MG tablet Take 75 mg by mouth every evening.    finasteride (PROSCAR) 5 MG tablet Take 5 mg by mouth daily.   gabapentin (NEURONTIN) 100 MG capsule Take 100 mg by mouth 3 (three) times daily.   hydrALAZINE (APRESOLINE) 10 MG tablet Take 1 tablet (10 mg total) by mouth 3 (three) times daily.   hydrALAZINE (APRESOLINE) 25 MG tablet Take 1 tablet (25 mg total) by mouth 3 (three) times daily as needed.   hydrochlorothiazide (MICROZIDE) 12.5 MG capsule Take 2 capsules (25 mg total) by mouth daily.   HYDROcodone bit-homatropine (HYCODAN) 5-1.5 MG/5ML syrup Take  5 mLs by mouth every 6 (six) hours as needed for cough.    iron polysaccharides (NIFEREX) 150 MG capsule Take 150 mg by mouth every Monday, Wednesday, and Friday.   losartan (COZAAR) 50 MG tablet Take 50 mg by mouth daily.   Misc Natural Products (GLUCOSAMINE CHONDROITIN TRIPLE) TABS Take 1 tablet by mouth 2 (two) times daily.   Multiple Vitamin (MULTIVITAMIN WITH MINERALS) TABS tablet Take 1 tablet by mouth daily.   oxyCODONE (OXY IR/ROXICODONE) 5 MG immediate release tablet Take 1 tablet (5 mg total) by mouth every 6 (six) hours as needed for severe pain.   pantoprazole (PROTONIX) 40 MG tablet TAKE 1 TABLET BY MOUTH TWICE A DAY (Patient taking differently: Take 40 mg by mouth at bedtime.)   polyethylene glycol (MIRALAX) 17 g packet Take 17 g by mouth daily as needed.   potassium chloride SA (K-DUR,KLOR-CON) 20 MEQ tablet Take 20 mEq by mouth every evening.    senna (SENOKOT) 8.6 MG TABS tablet Take 1 tablet (8.6 mg total) by mouth 2 (two) times daily.   tamsulosin (FLOMAX) 0.4 MG CAPS capsule Take 1 capsule (0.4 mg total) by mouth daily after supper.   No facility-administered encounter medications on file as of 07/17/2022.    Review of Systems  Constitutional:  Negative for activity change, appetite change and unexpected weight change.  HENT: Negative.    Respiratory:  Negative for cough and shortness of breath.   Cardiovascular:  Negative for leg swelling.  Gastrointestinal:  Negative for constipation.  Genitourinary:  Negative for frequency.  Musculoskeletal:  Positive for arthralgias, gait problem and myalgias. Negative for back pain.  Skin: Negative.  Negative for rash.  Neurological:  Positive for weakness. Negative for dizziness.  Psychiatric/Behavioral:  Negative for confusion and sleep disturbance.   All other systems reviewed and are negative.   Vitals:   07/17/22 0945  BP: (!) 180/66  Pulse: 73  Resp: 18  Temp: 97.9 F (36.6 C)  TempSrc: Temporal  SpO2: 95%  Weight: 160 lb 12.8 oz (72.9 kg)  Height: '5\' 4"'$  (1.626 m)    Body mass index is 27.6 kg/m. Physical Exam Vitals reviewed.  Constitutional:      Appearance: Normal appearance.  HENT:     Head: Normocephalic.     Nose: Nose normal.     Mouth/Throat:     Mouth: Mucous membranes are moist.     Pharynx: Oropharynx is clear.  Eyes:     Pupils: Pupils are equal, round, and reactive to light.  Cardiovascular:     Rate and Rhythm: Normal rate and regular rhythm.     Pulses: Normal pulses.     Heart sounds: No murmur heard. Pulmonary:     Effort: Pulmonary effort is normal. No respiratory distress.     Breath sounds: Normal breath sounds. No rales.  Abdominal:     General: Abdomen is flat. Bowel sounds are normal.     Palpations: Abdomen is soft.  Musculoskeletal:        General: No swelling.     Cervical back: Neck supple.  Skin:    General: Skin is warm.  Neurological:     General: No focal deficit present.     Mental Status: He is alert and oriented to person, place, and time.     Comments: Weakness iN Right leg  Psychiatric:        Mood and Affect: Mood normal.        Thought Content: Thought content normal.  Labs reviewed: Basic Metabolic Panel: Recent Labs    07/11/22 0334 07/12/22 0329 07/12/22 0330 07/13/22 0335  NA 142 143 143 145  K 3.8 4.6 4.6 4.0  CL 108 110 110 115*  CO2 '26 25 24 25  '$ GLUCOSE 129* 179* 181* 186*  BUN 16 26* 24* 33*  CREATININE 0.84 1.32* 1.34* 1.04  CALCIUM 8.7* 8.7* 8.7* 8.8*  MG 2.1 2.4  --  2.2  PHOS 3.3  --  3.4 2.8   Liver Function Tests: Recent Labs    05/13/22 1510 05/13/22 2334 05/14/22 0452 07/11/22 0334 07/12/22 0330 07/13/22 0335  AST '22 17 19  '$ --   --   --   ALT '21 22 21  '$ --   --   --   ALKPHOS 70 66 64  --   --   --   BILITOT 0.7 0.9 0.8  --   --   --   PROT 6.7 6.3* 6.1*  --   --   --   ALBUMIN 3.5 3.3* 3.2* 3.3* 2.7* 2.9*   No results for input(s): "LIPASE", "AMYLASE" in the last 8760 hours. Recent Labs    05/13/22 2334  AMMONIA 18   CBC: Recent Labs     05/15/22 0613 05/16/22 0110 07/09/22 1450 07/11/22 0334 07/12/22 0329 07/13/22 0335  WBC 7.3 8.7 9.0 10.2 14.4* 16.0*  NEUTROABS 5.0 6.4 7.3  --   --   --   HGB 13.5 12.6* 12.6* 12.2* 10.5* 9.6*  HCT 40.6 38.4* 38.5* 37.6* 32.6* 30.0*  MCV 83.2 83.3 87.1 88.3 89.6 89.3  PLT 195 189 257 202 201 205   Cardiac Enzymes: Recent Labs    05/13/22 2334  CKTOTAL 50   BNP: Invalid input(s): "POCBNP" Lab Results  Component Value Date   HGBA1C 5.5 11/23/2016   Lab Results  Component Value Date   TSH 1.104 05/13/2022   Lab Results  Component Value Date   EYCXKGYJ85 631 05/13/2022   Lab Results  Component Value Date   FOLATE 32.5 05/13/2022   Lab Results  Component Value Date   IRON 63 05/13/2022   TIBC 323 05/13/2022   FERRITIN 23 (L) 05/13/2022    Imaging and Procedures obtained prior to SNF admission: CT HEAD WO CONTRAST  Result Date: 07/09/2022 CLINICAL DATA:  [place] EXAM: CT HEAD WITHOUT CONTRAST TECHNIQUE: Contiguous axial images were obtained from the base of the skull through the vertex without intravenous contrast. RADIATION DOSE REDUCTION: This exam was performed according to the departmental dose-optimization program which includes automated exposure control, adjustment of the mA and/or kV according to patient size and/or use of iterative reconstruction technique. COMPARISON:  05/02/2022 FINDINGS: Brain: Stable confluent hypodensities throughout the periventricular white matter consistent with chronic small vessel ischemic changes. Chronic lacunar infarcts are again seen within the basal ganglia. No signs of acute infarct or hemorrhage. Lateral ventricles and remaining midline structures are unremarkable. No acute extra-axial fluid collections. No mass effect. Vascular: No hyperdense vessel or unexpected calcification. Skull: Small right parietal scalp hematoma. No underlying fracture. The remainder of the calvarium is unremarkable. Sinuses/Orbits: No acute finding. Other:  None. IMPRESSION: 1. Small right parietal scalp hematoma.  No underlying fracture. 2. No acute intracranial process. Electronically Signed   By: Randa Ngo M.D.   On: 07/09/2022 15:30   DG Hip Unilat  With Pelvis 2-3 Views Right  Result Date: 07/09/2022 CLINICAL DATA:  Right hip pain after fall. EXAM: DG HIP (WITH OR WITHOUT PELVIS) 2-3V RIGHT COMPARISON:  None Available. FINDINGS: Signs of impacted subcapital femoral neck fracture identified involving the proximal right femur. Left hip appears intact. Mild bilateral hip osteoarthritis. Atherosclerotic calcifications identified. IMPRESSION: Impacted subcapital femoral neck fracture involving the proximal right femur. Electronically Signed   By: Kerby Moors M.D.   On: 07/09/2022 15:26   DG Chest 1 View  Result Date: 07/09/2022 CLINICAL DATA:  Status post mechanical fall with right hip pain. For preoperative evaluation. EXAM: CHEST  1 VIEW COMPARISON:  Chest radiograph dated 05/13/2022 FINDINGS: Normal lung volumes. No focal consolidations. Bibasilar linear opacities. No pleural effusion or pneumothorax. Similar cardiomediastinal silhouette. The visualized skeletal structures are unremarkable. IMPRESSION: 1. No acute cardiopulmonary process. 2. Bibasilar atelectasis. Electronically Signed   By: Darrin Nipper M.D.   On: 07/09/2022 15:23    Assessment/Plan 1. Essential hypertension Change losartan to 50 mg BID Change Hydralazine to 25 mg TID Continue Norvasc and Prn Hydralazine  2. Closed fracture of neck of right femur, sequela Pain Control with Oxycodone Aspirin for DVT WBAT  Follow with Ortho  3. Mixed hyperlipidemia On statin  4. Iron deficiency anemia, unspecified iron deficiency anemia type Continue iron  5. AKI (acute kidney injury) ([place]) Creat back to normal repeat pending  6. Chronic cough Hydrocodone  syrup  7. MCI (mild cognitive impairment)  8 Peptic ulcer On Protonix  9 CAD Aspirin Plavix and statin 10 h/o  Urinary retension Doing well on Flomax and Proscar BMP and CBC pending  Family/ staff Communication:   Labs/tests ordered:

## 2022-07-20 LAB — CBC AND DIFFERENTIAL
HCT: 32 — AB (ref 41–53)
Hemoglobin: 10.4 — AB (ref 13.5–17.5)
Platelets: 258 10*3/uL (ref 150–400)
WBC: 11

## 2022-07-20 LAB — CBC: RBC: 3.57 — AB (ref 3.87–5.11)

## 2022-07-20 LAB — BASIC METABOLIC PANEL
BUN: 33 — AB (ref 4–21)
CO2: 23 — AB (ref 13–22)
Chloride: 107 (ref 99–108)
Creatinine: 1 (ref 0.6–1.3)
Glucose: 110
Potassium: 4.2 mEq/L (ref 3.5–5.1)
Sodium: 141 (ref 137–147)

## 2022-07-20 LAB — COMPREHENSIVE METABOLIC PANEL: Calcium: 9 (ref 8.7–10.7)

## 2022-07-21 ENCOUNTER — Other Ambulatory Visit: Payer: Self-pay | Admitting: Adult Health

## 2022-07-21 MED ORDER — HYDRALAZINE HCL 50 MG PO TABS: 50.0000 mg | ORAL_TABLET | Freq: Three times a day (TID) | ORAL | Status: DC

## 2022-07-21 MED ORDER — OXYCODONE HCL 5 MG PO TABS: 5.0000 mg | ORAL_TABLET | Freq: Four times a day (QID) | ORAL | 0 refills | Status: DC | PRN

## 2022-07-21 NOTE — Progress Notes (Signed)
BP still over 170s, pulse 61. Will increase hydralazine to 50 mg tid

## 2022-07-27 LAB — BASIC METABOLIC PANEL
BUN: 30 — AB (ref 4–21)
CO2: 23 — AB (ref 13–22)
Chloride: 103 (ref 99–108)
Creatinine: 1.5 — AB (ref 0.6–1.3)
Glucose: 170
Potassium: 3.9 mEq/L (ref 3.5–5.1)
Sodium: 140 (ref 137–147)

## 2022-07-27 LAB — COMPREHENSIVE METABOLIC PANEL
Calcium: 9.4 (ref 8.7–10.7)
eGFR: 44

## 2022-07-27 LAB — CBC AND DIFFERENTIAL
HCT: 33 — AB (ref 41–53)
Hemoglobin: 10.5 — AB (ref 13.5–17.5)
Platelets: 296 10*3/uL (ref 150–400)
WBC: 15.5

## 2022-07-27 LAB — CBC: RBC: 3.73 — AB (ref 3.87–5.11)

## 2022-07-28 ENCOUNTER — Non-Acute Institutional Stay (SKILLED_NURSING_FACILITY): Payer: Medicare Other | Admitting: Adult Health

## 2022-07-28 ENCOUNTER — Encounter: Payer: Self-pay | Admitting: Adult Health

## 2022-07-28 DIAGNOSIS — N401 Enlarged prostate with lower urinary tract symptoms: Secondary | ICD-10-CM

## 2022-07-28 DIAGNOSIS — I10 Essential (primary) hypertension: Secondary | ICD-10-CM | POA: Insufficient documentation

## 2022-07-28 DIAGNOSIS — I259 Chronic ischemic heart disease, unspecified: Secondary | ICD-10-CM | POA: Insufficient documentation

## 2022-07-28 DIAGNOSIS — H903 Sensorineural hearing loss, bilateral: Secondary | ICD-10-CM | POA: Insufficient documentation

## 2022-07-28 DIAGNOSIS — N3000 Acute cystitis without hematuria: Secondary | ICD-10-CM | POA: Diagnosis not present

## 2022-07-28 DIAGNOSIS — R3914 Feeling of incomplete bladder emptying: Secondary | ICD-10-CM

## 2022-07-28 DIAGNOSIS — E785 Hyperlipidemia, unspecified: Secondary | ICD-10-CM | POA: Insufficient documentation

## 2022-07-28 DIAGNOSIS — N1831 Chronic kidney disease, stage 3a: Secondary | ICD-10-CM

## 2022-07-28 DIAGNOSIS — Z029 Encounter for administrative examinations, unspecified: Secondary | ICD-10-CM | POA: Insufficient documentation

## 2022-07-28 DIAGNOSIS — Z461 Encounter for fitting and adjustment of hearing aid: Secondary | ICD-10-CM | POA: Insufficient documentation

## 2022-07-28 MED ORDER — HYDRALAZINE HCL 50 MG PO TABS: 50.0000 mg | ORAL_TABLET | Freq: Four times a day (QID) | ORAL | Status: DC

## 2022-07-28 MED ORDER — CEPHALEXIN 500 MG PO CAPS: 500.0000 mg | ORAL_CAPSULE | Freq: Three times a day (TID) | ORAL | 0 refills | Status: DC

## 2022-07-28 NOTE — Progress Notes (Signed)
Location:  [facility] Room Number: Rehab 155A Place of Service:  SNF [phone]) Provider:  Rolla Plate, MD  Patient Care Team: [name], MD as PCP - General (Endocrinology) Lorretta Harp, MD as PCP - Cardiology (Cardiology)  Extended Emergency Contact Information Primary Emergency Contact: Marjie Skiff Address: [address]          [place], [place] [address] Johnnette Litter of [place] Phone: [phone] Mobile Phone: [phone] Relation: Spouse  Code Status:  Full code Goals of care: Advanced Directive information    07/28/2022    9:01 AM  Advanced Directives  Does Patient Have a Medical Advance Directive? Yes  Type of Paramedic of [place];Out of facility DNR (pink MOST or yellow form)  Does patient want to make changes to medical advance directive? No - Patient declined  Copy of [place] in Chart? Yes - validated most recent copy scanned in chart (See row information)     Chief Complaint  Patient presents with   Acute Visit    Patient is being seen because he is experiencing some confusion.   Immunizations    Discussed the need for shingles, pneumonia and covid vaccine    Quality Metric Gaps    Discussed the need for AWV    HPI:  Pt is a 88 y.o. male seen today for an acute visit for leukocytosis and low grade temp.  He is currently in skilled rehab due to a fall with right femur fracture s/p repair THA by Dr Alvan Dame 07/11/22.  He was having some agitation, low grade temp, and not feeling well on 12/27.  ON 12/28 stat BMP and CBC and UA were done.  WBC 15.5 Hgb 10.5 Plt 296 BMP K 3.9 NA 140 BUN 30 Cr 1.52  UA urine color orange, leuk esterase 500 WBC 398 bacteria few nitrite neg blood neg  Pt denies bladder pain or dysuria.  Has had a hx of urinary retention with foley in the past and BPH. He did require I and O cath 12/25 with 400 returned.   Has since had incontinent episodes.    Past Medical History:  Diagnosis Date   AAA (abdominal aortic aneurysm) ([facility])    asymptomatic   AAA (abdominal aortic aneurysm) ([facility]) 2010   peripheral  angiogram-- bilateral SFA DISEASE  and 60 to 70% infrarenaal abd. aortic stenosis with 15 -mm gradient   Adenomatous colon polyp 11/2003   CAD (coronary artery disease)    Gait abnormality 02/13/2017   Gastroparesis    pt denies   GERD (gastroesophageal reflux disease)    w/ LPR   History of kidney stones    x2   Hyperlipidemia    Hypertension    IDA (iron deficiency anemia)    Peripheral arterial disease ([facility])    RCEA  by Dr Lemar Livings   PUD (peptic ulcer disease)    pt unaware   Vertigo    Past Surgical History:  Procedure Laterality Date   ABDOMINAL AORTOGRAM W/LOWER EXTREMITY Bilateral 08/05/2018   Procedure: ABDOMINAL AORTOGRAM W/LOWER EXTREMITY;  Surgeon: Lorretta Harp, MD;  Location: [facility];  Service: Cardiovascular;  Laterality: Bilateral;   ABDOMINAL AORTOGRAM W/LOWER EXTREMITY N/A 09/26/2021   Procedure: ABDOMINAL AORTOGRAM W/LOWER EXTREMITY;  Surgeon: Lorretta Harp, MD;  Location: [facility];  Service: Cardiovascular;  Laterality: N/A;   AORTOGRAM  04/24/2016    Abdominal aortogram, bilateral iliac angiogram, bifemoral  runoff   CARDIAC CATHETERIZATION  05/12/2005   RCA   carotid doppler  01/24/2013   RICA endarterectomy,left CCA 0-49%; left bulb and prox ICA 50-69%; bilaateral subclavian < 50%   CAROTID ENDARTERECTOMY  11/01/2010   CATARACT EXTRACTION     COLONOSCOPY     CORONARY ANGIOPLASTY  05/18/2005   2 STENTS distal RCA AND PROXIMAL-MID RCA   5 total stents per pt.   CYSTOSCOPY/URETEROSCOPY/HOLMIUM LASER/STENT PLACEMENT Left 01/11/2018   Procedure: LEFT URETEROSCOPY/HOLMIUM LASER/STENT PLACEMENT;  Surgeon: Ardis Hughs, MD;  Location: [facility];  Service: Urology;  Laterality: Left;   DOPPLER ECHOCARDIOGRAPHY  02/07/2012   EF  55%,SHOWED NO ISCHEMIA    HERNIA REPAIR     umbilical   lower arterial  doppler  02/04/2013   aotra 1.5 x 1.5 cm; distal abd aorta 70-99%,proximal common iliac arteries -very stenotic with increased velocities>50%,may be falsely elevated as a result of residual plaque from the distal aorta stenosis   lower extremity doppler  June 18 ,2013   ABI'S ABNORMAL, RABI was 0.88 and LABI 0.75 ,with 3-vessel  run off   NM MYOVIEW LTD  MAY 23,2011   showed no significant ischemia;   NM MYOVIEW LTD  04/22/2008   ef 77%,exercise capcity 6 METS ,exaggerated blood pressure response to exercise   PERIPHERAL VASCULAR CATHETERIZATION N/A 04/24/2016   Procedure: Lower Extremity Angiography;  Surgeon: Lorretta Harp, MD;  Location: [facility];  Service: Cardiovascular;  Laterality: N/A;   PERIPHERAL VASCULAR CATHETERIZATION  04/24/2016   Procedure: Peripheral Vascular Intervention;  Surgeon: Lorretta Harp, MD;  Location: [facility];  Service: Cardiovascular;;  Aorta   retrograde central aortic catheterization  05/19/2005   cutting balloon atherectomy, c-circ stenosis with DES STENTING CYPHER   TONSILLECTOMY     TOTAL HIP ARTHROPLASTY Right 07/11/2022   Procedure: TOTAL HIP ARTHROPLASTY ANTERIOR APPROACH;  Surgeon: Paralee Cancel, MD;  Location: [facility];  Service: Orthopedics;  Laterality: Right;   TOTAL KNEE ARTHROPLASTY Left 01/03/2019   Procedure: TOTAL KNEE ARTHROPLASTY;  Surgeon: Netta Cedars, MD;  Location: [facility];  Service: Orthopedics;  Laterality: Left;  with IS block   TRANSURETHRAL RESECTION OF PROSTATE N/A 09/08/2016   Procedure: TRANSURETHRAL RESECTION OF THE PROSTATE (TURP);  Surgeon: Ardis Hughs, MD;  Location: [facility];  Service: Urology;  Laterality: N/A;   TRANSURETHRAL RESECTION OF PROSTATE     10-10-17  Dr. Louis Meckel   TRANSURETHRAL RESECTION OF PROSTATE N/A 10/10/2017   Procedure: TRANSURETHRAL RESECTION OF THE PROSTATE (TURP);  Surgeon: Ardis Hughs, MD;  [facility];  Service: Urology;  Laterality: N/A;   VASECTOMY      Allergies  Allergen Reactions   Lisinopril Cough   Niaspan [Niacin Er] Rash    Outpatient Encounter Medications as of 07/28/2022  Medication Sig   acetaminophen (TYLENOL) 500 MG tablet Take 1 tablet (500 mg total) by mouth 2 (two) times daily as needed. (Patient taking differently: Take 500 mg by mouth 2 (two) times daily as needed for mild pain.)   amLODipine (NORVASC) 10 MG tablet Take 10 mg by mouth daily.   Apoaequorin (PREVAGEN PO) Take 1 tablet by mouth daily.   aspirin EC 81 MG tablet Take 1 tablet (81 mg total) by mouth 2 (two) times a day. (Patient taking differently: Take 81 mg by mouth in the morning and at bedtime.)   atenolol (TENORMIN) 50 MG tablet Take 50 mg by mouth daily.   atorvastatin (LIPITOR) 40 MG  tablet Take 40 mg by mouth daily at 6 PM.    Biotin 10 MG CAPS Take 10 mg by mouth daily.   cholecalciferol (VITAMIN D3) 25 MCG (1000 UT) tablet Take 1,000 Units by mouth daily.   clopidogrel (PLAVIX) 75 MG tablet Take 75 mg by mouth every evening.    finasteride (PROSCAR) 5 MG tablet Take 5 mg by mouth daily.   gabapentin (NEURONTIN) 100 MG capsule Take 100 mg by mouth 3 (three) times daily.   Glucosamine-Chondroitin 750-600 MG TABS Take 1 tablet by mouth 2 (two) times daily. Take 1 tablet PO 2 times daily.   hydrALAZINE (APRESOLINE) 10 MG tablet Take 1 tablet (10 mg total) by mouth 3 (three) times daily.   hydrALAZINE (APRESOLINE) 50 MG tablet Take 1 tablet (50 mg total) by mouth 3 (three) times daily.   hydrochlorothiazide (MICROZIDE) 12.5 MG capsule Take 2 capsules (25 mg total) by mouth daily.   HYDROcodone bit-homatropine (HYCODAN) 5-1.5 MG/5ML syrup Take 5 mLs by mouth every 6 (six) hours as needed for cough.   losartan (COZAAR) 50 MG tablet Take 50 mg by mouth daily.   Misc Natural Products (GLUCOSAMINE CHONDROITIN TRIPLE) TABS Take 1 tablet by mouth 2 (two) times daily.   Multiple Vitamin (MULTIVITAMIN  WITH MINERALS) TABS tablet Take 1 tablet by mouth daily.   oxyCODONE (OXY IR/ROXICODONE) 5 MG immediate release tablet Take 1 tablet (5 mg total) by mouth every 6 (six) hours as needed for severe pain.   pantoprazole (PROTONIX) 40 MG tablet TAKE 1 TABLET BY MOUTH TWICE A DAY (Patient taking differently: Take 40 mg by mouth at bedtime.)   polyethylene glycol (MIRALAX) 17 g packet Take 17 g by mouth daily as needed.   potassium chloride SA (K-DUR,KLOR-CON) 20 MEQ tablet Take 20 mEq by mouth every evening.    senna (SENOKOT) 8.6 MG TABS tablet Take 1 tablet (8.6 mg total) by mouth 2 (two) times daily.   tamsulosin (FLOMAX) 0.4 MG CAPS capsule Take 1 capsule (0.4 mg total) by mouth daily after supper.   iron polysaccharides (NIFEREX) 150 MG capsule Take 150 mg by mouth every Monday, Wednesday, and Friday.   No facility-administered encounter medications on file as of 07/28/2022.    Review of Systems  Constitutional:  Positive for activity change and fever. Negative for appetite change, chills, diaphoresis, fatigue and unexpected weight change.  HENT:  Negative for congestion.   Respiratory:  Negative for cough, shortness of breath, wheezing and stridor.   Cardiovascular:  Negative for chest pain, palpitations and leg swelling.  Gastrointestinal:  Negative for abdominal distention, abdominal pain, constipation and diarrhea.  Genitourinary:  Positive for decreased urine volume. Negative for difficulty urinating, dysuria, flank pain and hematuria.  Musculoskeletal:  Positive for gait problem. Negative for arthralgias, back pain, joint swelling and myalgias.  Neurological:  Negative for dizziness, seizures, syncope, facial asymmetry, speech difficulty, weakness and headaches.  Hematological:  Negative for adenopathy. Does not bruise/bleed easily.  Psychiatric/Behavioral:  Positive for confusion. Negative for agitation and behavioral problems.     Immunization History  Administered Date(s)  Administered   Fluad Quad(high Dose 65+) 05/02/2022   Influenza, High Dose Seasonal PF 03/31/2017, 03/29/2018   Influenza,inj,Quad PF,6+ Mos 04/25/2016, 05/06/2018   Influenza,inj,quad, With Preservative 05/26/2019   Influenza-Unspecified 05/14/2001, 05/31/2001, 05/14/2002, 05/15/2002, 05/18/2003, 05/25/2004, 04/30/2005, 05/29/2006, 05/01/2007, 05/11/2008, 05/31/2009, 05/31/2010, 04/30/2013, 05/31/2014, 03/31/2021   Moderna Covid-19 Vaccine Bivalent Booster 67yr & up 08/31/2021   Moderna SARS-COV2 Booster Vaccination 06/15/2020, 11/19/2020, 01/02/2022   Moderna  Sars-Covid-2 Vaccination 08/12/2019, 09/10/2019, 05/31/2022   Pneumococcal Polysaccharide-23 05/02/2000, 09/24/2018   Td 09/08/2013   Pertinent  Health Maintenance Due  Topic Date Due   INFLUENZA VACCINE  Completed      07/11/2022    7:25 PM 07/12/2022    9:20 AM 07/12/2022    7:10 PM 07/13/2022    7:10 AM 07/28/2022    9:00 AM  Fall Risk  Patient Fall Risk Level High fall risk High fall risk High fall risk High fall risk High fall risk   Functional Status Survey:    Vitals:   07/28/22 0848  BP: (!) 154/56  Pulse: 70  Resp: 16  Temp: 97.9 F (36.6 C)  TempSrc: Temporal  SpO2: 96%  Weight: 163 lb 12.8 oz (74.3 kg)  Height: '5\' 4"'$  (1.626 m)   Body mass index is 28.12 kg/m. Physical Exam Vitals and nursing note reviewed.  Constitutional:      General: He is not in acute distress.    Appearance: He is not diaphoretic.  HENT:     Head: Normocephalic and atraumatic.  Neck:     Thyroid: No thyromegaly.     Vascular: No JVD.     Trachea: No tracheal deviation.  Cardiovascular:     Rate and Rhythm: Normal rate and regular rhythm.     Heart sounds: No murmur heard. Pulmonary:     Effort: Pulmonary effort is normal. No respiratory distress.     Breath sounds: Normal breath sounds. No wheezing.  Abdominal:     General: Bowel sounds are normal. There is distension (mild).     Palpations: Abdomen is soft.      Tenderness: There is no abdominal tenderness. There is no right CVA tenderness, left CVA tenderness or guarding.  Lymphadenopathy:     Cervical: No cervical adenopathy.  Skin:    General: Skin is warm and dry.  Neurological:     General: No focal deficit present.     Mental Status: He is alert and oriented to person, place, and time. Mental status is at baseline.  Psychiatric:        Mood and Affect: Mood normal.     Labs reviewed: Recent Labs    07/11/22 0334 07/12/22 0329 07/12/22 0330 07/13/22 0335 07/20/22 0000 07/27/22 0000  NA 142 143 143 145 141 140  K 3.8 4.6 4.6 4.0 4.2 3.9  CL 108 110 110 115* 107 103  CO2 '26 25 24 25 '$ 23* 23*  GLUCOSE 129* 179* 181* 186*  --   --   BUN 16 26* 24* 33* 33* 30*  CREATININE 0.84 1.32* 1.34* 1.04 1.0 1.5*  CALCIUM 8.7* 8.7* 8.7* 8.8* 9.0 9.4  MG 2.1 2.4  --  2.2  --   --   PHOS 3.3  --  3.4 2.8  --   --    Recent Labs    05/13/22 1510 05/13/22 2334 05/14/22 0452 07/11/22 0334 07/12/22 0330 07/13/22 0335  AST '22 17 19  '$ --   --   --   ALT '21 22 21  '$ --   --   --   ALKPHOS 70 66 64  --   --   --   BILITOT 0.7 0.9 0.8  --   --   --   PROT 6.7 6.3* 6.1*  --   --   --   ALBUMIN 3.5 3.3* 3.2* 3.3* 2.7* 2.9*   Recent Labs    05/15/22 0613 05/16/22 0110 07/09/22 1450 07/11/22 0334  07/12/22 0329 07/13/22 0335 07/20/22 0000 07/27/22 0000  WBC 7.3 8.7 9.0 10.2 14.4* 16.0* 11.0 15.5  NEUTROABS 5.0 6.4 7.3  --   --   --   --   --   HGB 13.5 12.6* 12.6* 12.2* 10.5* 9.6* 10.4* 10.5*  HCT 40.6 38.4* 38.5* 37.6* 32.6* 30.0* 32* 33*  MCV 83.2 83.3 87.1 88.3 89.6 89.3  --   --   PLT 195 189 257 202 201 205 258 296   Lab Results  Component Value Date   TSH 1.104 05/13/2022   Lab Results  Component Value Date   HGBA1C 5.5 11/23/2016   Lab Results  Component Value Date   CHOL 108 11/23/2016   HDL 45 11/23/2016   LDLCALC 44 11/23/2016   TRIG 94 11/23/2016   CHOLHDL 2.4 11/23/2016    Significant Diagnostic Results in last  30 days:  DG Pelvis Portable  Result Date: 07/11/2022 CLINICAL DATA:  Right hip replacement EXAM: PORTABLE PELVIS 1 VIEWS COMPARISON:  05/13/2022 FINDINGS: Right hip replacement in satisfactory position and alignment. No fracture or complication. Mild joint space narrowing left hip. IMPRESSION: Satisfactory right hip replacement without complication. Electronically Signed   By: Franchot Gallo M.D.   On: 07/11/2022 15:32   DG C-Arm 1-60 Min-No Report  Result Date: 07/11/2022 CLINICAL DATA:  Right hip replacement EXAM: DG HIP (WITH OR WITHOUT PELVIS) 1V RIGHT; DG C-ARM 1-60 MIN-NO REPORT COMPARISON:  07/09/2022 FINDINGS: Seven C-arm images were obtained of the right hip. Right hip replacement in satisfactory position and alignment. No acute complication. IMPRESSION: Satisfactory right hip replacement. Electronically Signed   By: Franchot Gallo M.D.   On: 07/11/2022 14:54   DG HIP UNILAT WITH PELVIS 1V RIGHT  Result Date: 07/11/2022 CLINICAL DATA:  Right hip replacement EXAM: DG HIP (WITH OR WITHOUT PELVIS) 1V RIGHT; DG C-ARM 1-60 MIN-NO REPORT COMPARISON:  07/09/2022 FINDINGS: Seven C-arm images were obtained of the right hip. Right hip replacement in satisfactory position and alignment. No acute complication. IMPRESSION: Satisfactory right hip replacement. Electronically Signed   By: Franchot Gallo M.D.   On: 07/11/2022 14:54   DG C-Arm 1-60 Min-No Report  Result Date: 07/11/2022 CLINICAL DATA:  Right hip replacement EXAM: DG HIP (WITH OR WITHOUT PELVIS) 1V RIGHT; DG C-ARM 1-60 MIN-NO REPORT COMPARISON:  07/09/2022 FINDINGS: Seven C-arm images were obtained of the right hip. Right hip replacement in satisfactory position and alignment. No acute complication. IMPRESSION: Satisfactory right hip replacement. Electronically Signed   By: Franchot Gallo M.D.   On: 07/11/2022 14:54   CT HEAD WO CONTRAST  Result Date: 07/09/2022 CLINICAL DATA:  [place] EXAM: CT HEAD WITHOUT CONTRAST TECHNIQUE: Contiguous  axial images were obtained from the base of the skull through the vertex without intravenous contrast. RADIATION DOSE REDUCTION: This exam was performed according to the departmental dose-optimization program which includes automated exposure control, adjustment of the mA and/or kV according to patient size and/or use of iterative reconstruction technique. COMPARISON:  05/02/2022 FINDINGS: Brain: Stable confluent hypodensities throughout the periventricular white matter consistent with chronic small vessel ischemic changes. Chronic lacunar infarcts are again seen within the basal ganglia. No signs of acute infarct or hemorrhage. Lateral ventricles and remaining midline structures are unremarkable. No acute extra-axial fluid collections. No mass effect. Vascular: No hyperdense vessel or unexpected calcification. Skull: Small right parietal scalp hematoma. No underlying fracture. The remainder of the calvarium is unremarkable. Sinuses/Orbits: No acute finding. Other: None. IMPRESSION: 1. Small right parietal scalp hematoma.  No underlying fracture. 2. No acute intracranial process. Electronically Signed   By: Randa Ngo M.D.   On: 07/09/2022 15:30   DG Hip Unilat  With Pelvis 2-3 Views Right  Result Date: 07/09/2022 CLINICAL DATA:  Right hip pain after fall. EXAM: DG HIP (WITH OR WITHOUT PELVIS) 2-3V RIGHT COMPARISON:  None Available. FINDINGS: Signs of impacted subcapital femoral neck fracture identified involving the proximal right femur. Left hip appears intact. Mild bilateral hip osteoarthritis. Atherosclerotic calcifications identified. IMPRESSION: Impacted subcapital femoral neck fracture involving the proximal right femur. Electronically Signed   By: Kerby Moors M.D.   On: 07/09/2022 15:26   DG Chest 1 View  Result Date: 07/09/2022 CLINICAL DATA:  Status post mechanical fall with right hip pain. For preoperative evaluation. EXAM: CHEST  1 VIEW COMPARISON:  Chest radiograph dated 05/13/2022  FINDINGS: Normal lung volumes. No focal consolidations. Bibasilar linear opacities. No pleural effusion or pneumothorax. Similar cardiomediastinal silhouette. The visualized skeletal structures are unremarkable. IMPRESSION: 1. No acute cardiopulmonary process. 2. Bibasilar atelectasis. Electronically Signed   By: Darrin Nipper M.D.   On: 07/09/2022 15:23    Assessment/Plan  1. Acute cystitis without hematuria Add keflex 500 mg tid, follow cx.  2. Benign prostatic hyperplasia with incomplete bladder emptying Check bladder scan again after voiding and report to me.   3. Essential hypertension Increase hydralazine to 50 mg qid   4. CKD  Slightly worse ?retention, will check PVR    Labs/tests ordered:  NA

## 2022-08-11 ENCOUNTER — Ambulatory Visit: Payer: Medicare Other | Admitting: Podiatry

## 2022-08-15 ENCOUNTER — Other Ambulatory Visit: Payer: Self-pay | Admitting: Orthopedic Surgery

## 2022-08-15 DIAGNOSIS — S72001S Fracture of unspecified part of neck of right femur, sequela: Secondary | ICD-10-CM

## 2022-08-15 MED ORDER — OXYCODONE HCL 5 MG PO TABS: 5.0000 mg | ORAL_TABLET | Freq: Four times a day (QID) | ORAL | 0 refills | Status: DC | PRN

## 2022-08-15 NOTE — Progress Notes (Deleted)
Cardiology Clinic Note   Patient Name: Nathan Santiago Nathan Santiago Date of Encounter: 08/15/2022  Primary Care Provider:  [name], MD Primary Cardiologist:  Nathan Burow, MD  Patient Profile    89 year old male with history of hypertension, hyperlipidemia, remote tobacco abuse, CAD status post stenting of the proximal and mid distal RCA using overlapping stents and proximal AV groove circumflex with known occluded OM branch.  Carotid artery disease status post right carotid endarterectomy and moderate left ICA stenosis.  PAD status post peripheral angiography on 04/24/2016 revealing 75% distal abdominal aortic stenosis, 95% calcified distal left common femoral artery stenosis and 80% segmental mid right SFA stenosis with two-vessel runoff bilaterally.    Dr. Gwenlyn Santiago stented in his distal abdominal aorta with a 10 x 29 mm long balloon expandable stent.  Repeat peripheral angiography 08/05/2018 revealed patent distal abdominal aortic stent without gradient.  Repeat peripheral angiography on 09/26/2021 revealed 90% left renal artery stenosis, patent distal abdominal aorta stent and high-grade calcified left greater than right common femoral artery stenosis and focal bilateral mid SFA disease.  He was seen by Dr. Scot Santiago, who did not feel the patient was a good candidate for intervention due to age and comorbidities.  Past Medical History    Past Medical History:  Diagnosis Date   AAA (abdominal aortic aneurysm) ([place])    asymptomatic   AAA (abdominal aortic aneurysm) ([place]) 2010   peripheral  angiogram-- bilateral SFA DISEASE  and 60 to 70% infrarenaal abd. aortic stenosis with 15 -mm gradient   Adenomatous colon polyp 11/2003   CAD (coronary artery disease)    Gait abnormality 02/13/2017   Gastroparesis    pt denies   GERD (gastroesophageal reflux disease)    w/ LPR   History of kidney stones    x2   Hyperlipidemia    Hypertension    IDA (iron deficiency anemia)    Peripheral arterial disease  ([place])    RCEA  by Dr Nathan Santiago   PUD (peptic ulcer disease)    pt unaware   Vertigo    Past Surgical History:  Procedure Laterality Date   ABDOMINAL AORTOGRAM W/LOWER EXTREMITY Bilateral 08/05/2018   Procedure: ABDOMINAL AORTOGRAM W/LOWER EXTREMITY;  Surgeon: Nathan Harp, MD;  Location: [facility];  Service: Cardiovascular;  Laterality: Bilateral;   ABDOMINAL AORTOGRAM W/LOWER EXTREMITY N/A 09/26/2021   Procedure: ABDOMINAL AORTOGRAM W/LOWER EXTREMITY;  Surgeon: Nathan Harp, MD;  Location: [facility];  Service: Cardiovascular;  Laterality: N/A;   AORTOGRAM  04/24/2016    Abdominal aortogram, bilateral iliac angiogram, bifemoral runoff   CARDIAC CATHETERIZATION  05/12/2005   RCA   carotid doppler  01/24/2013   RICA endarterectomy,left CCA 0-49%; left bulb and prox ICA 50-69%; bilaateral subclavian < 50%   CAROTID ENDARTERECTOMY  11/01/2010   CATARACT EXTRACTION     COLONOSCOPY     CORONARY ANGIOPLASTY  05/18/2005   2 STENTS distal RCA AND PROXIMAL-MID RCA   5 total stents per pt.   CYSTOSCOPY/URETEROSCOPY/HOLMIUM LASER/STENT PLACEMENT Left 01/11/2018   Procedure: LEFT URETEROSCOPY/HOLMIUM LASER/STENT PLACEMENT;  Surgeon: Nathan Hughs, MD;  Location: [facility];  Service: Urology;  Laterality: Left;   DOPPLER ECHOCARDIOGRAPHY  02/07/2012   EF 55%,SHOWED NO ISCHEMIA    HERNIA REPAIR     umbilical   lower arterial  doppler  02/04/2013   aotra 1.5 x 1.5 cm; distal abd aorta 70-99%,proximal common iliac arteries -very stenotic with increased velocities>50%,may be falsely elevated as a result of residual  plaque from the distal aorta stenosis   lower extremity doppler  June 18 ,2013   ABI'S ABNORMAL, RABI was 0.88 and LABI 0.75 ,with 3-vessel  run off   NM MYOVIEW LTD  MAY 23,2011   showed no significant ischemia;   NM MYOVIEW LTD  04/22/2008   ef 77%,exercise capcity 6 METS ,exaggerated blood pressure response to exercise   PERIPHERAL VASCULAR CATHETERIZATION  N/A 04/24/2016   Procedure: Lower Extremity Angiography;  Surgeon: Nathan Harp, MD;  Location: [facility];  Service: Cardiovascular;  Laterality: N/A;   PERIPHERAL VASCULAR CATHETERIZATION  04/24/2016   Procedure: Peripheral Vascular Intervention;  Surgeon: Nathan Harp, MD;  Location: [facility];  Service: Cardiovascular;;  Aorta   retrograde central aortic catheterization  05/19/2005   cutting balloon atherectomy, c-circ stenosis with DES STENTING CYPHER   TONSILLECTOMY     TOTAL HIP ARTHROPLASTY Right 07/11/2022   Procedure: TOTAL HIP ARTHROPLASTY ANTERIOR APPROACH;  Surgeon: Nathan Cancel, MD;  Location: [facility];  Service: Orthopedics;  Laterality: Right;   TOTAL KNEE ARTHROPLASTY Left 01/03/2019   Procedure: TOTAL KNEE ARTHROPLASTY;  Surgeon: Nathan Cedars, MD;  Location: [facility];  Service: Orthopedics;  Laterality: Left;  with IS block   TRANSURETHRAL RESECTION OF PROSTATE N/A 09/08/2016   Procedure: TRANSURETHRAL RESECTION OF THE PROSTATE (TURP);  Surgeon: Nathan Hughs, MD;  Location: [facility];  Service: Urology;  Laterality: N/A;   TRANSURETHRAL RESECTION OF PROSTATE     10-10-17  Dr. Louis Santiago   TRANSURETHRAL RESECTION OF PROSTATE N/A 10/10/2017   Procedure: TRANSURETHRAL RESECTION OF THE PROSTATE (TURP);  Surgeon: Nathan Hughs, MD;  Location: [facility];  Service: Urology;  Laterality: N/A;   VASECTOMY      Allergies  Allergies  Allergen Reactions   Lisinopril Cough   Niaspan [Niacin Er] Rash    History of Present Illness    Mr. Nathan Santiago presents today for ongoing assessment and management of hypertension, hyperlipidemia, remote tobacco abuse, CAD status post stenting of the proximal and mid distal RCA using overlapping stents and proximal AV groove circumflex with known occluded OM branch, PAD with 90% left renal artery stenosis, patent distal abdominal aorta stent and high-grade calcified left greater than right common femoral artery stenosis and focal  bilateral mid SFA disease.  He is not Santiago to be a surgical candidate due to age and morbidities.  Home Medications    Current Outpatient Medications  Medication Sig Dispense Refill   acetaminophen (TYLENOL) 500 MG tablet Take 1 tablet (500 mg total) by mouth 2 (two) times daily as needed. (Patient taking differently: Take 500 mg by mouth 2 (two) times daily as needed for mild pain.) 30 tablet 2   amLODipine (NORVASC) 10 MG tablet Take 10 mg by mouth daily.     Apoaequorin (PREVAGEN PO) Take 1 tablet by mouth daily.     aspirin EC 81 MG tablet Take 1 tablet (81 mg total) by mouth 2 (two) times a day. (Patient taking differently: Take 81 mg by mouth in the morning and at bedtime.) 60 tablet 0   atenolol (TENORMIN) 50 MG tablet Take 50 mg by mouth daily.     atorvastatin (LIPITOR) 40 MG tablet Take 40 mg by mouth daily at 6 PM.      Biotin 10 MG CAPS Take 10 mg by mouth daily.     cephALEXin (KEFLEX) 500 MG capsule Take 1 capsule (500 mg total) by mouth 3 (three) times daily. 21 capsule 0  cholecalciferol (VITAMIN D3) 25 MCG (1000 UT) tablet Take 1,000 Units by mouth daily.     clopidogrel (PLAVIX) 75 MG tablet Take 75 mg by mouth every evening.      finasteride (PROSCAR) 5 MG tablet Take 5 mg by mouth daily.     gabapentin (NEURONTIN) 100 MG capsule Take 100 mg by mouth 3 (three) times daily.     Glucosamine-Chondroitin 750-600 MG TABS Take 1 tablet by mouth 2 (two) times daily. Take 1 tablet PO 2 times daily.     hydrALAZINE (APRESOLINE) 50 MG tablet Take 1 tablet (50 mg total) by mouth 4 (four) times daily.     hydrochlorothiazide (MICROZIDE) 12.5 MG capsule Take 2 capsules (25 mg total) by mouth daily.     HYDROcodone bit-homatropine (HYCODAN) 5-1.5 MG/5ML syrup Take 5 mLs by mouth every 6 (six) hours as needed for cough. 120 mL 0   iron polysaccharides (NIFEREX) 150 MG capsule Take 150 mg by mouth every Monday, Wednesday, and Friday.     losartan (COZAAR) 50 MG tablet Take 50 mg by mouth  daily.     Misc Natural Products (GLUCOSAMINE CHONDROITIN TRIPLE) TABS Take 1 tablet by mouth 2 (two) times daily.     Multiple Vitamin (MULTIVITAMIN WITH MINERALS) TABS tablet Take 1 tablet by mouth daily.     oxyCODONE (OXY IR/ROXICODONE) 5 MG immediate release tablet Take 1 tablet (5 mg total) by mouth every 6 (six) hours as needed for severe pain. 30 tablet 0   pantoprazole (PROTONIX) 40 MG tablet TAKE 1 TABLET BY MOUTH TWICE A DAY (Patient taking differently: Take 40 mg by mouth at bedtime.) 180 tablet 0   polyethylene glycol (MIRALAX) 17 g packet Take 17 g by mouth daily as needed. 14 each 0   potassium chloride SA (K-DUR,KLOR-CON) 20 MEQ tablet Take 20 mEq by mouth every evening.      senna (SENOKOT) 8.6 MG TABS tablet Take 1 tablet (8.6 mg total) by mouth 2 (two) times daily. 120 tablet 0   tamsulosin (FLOMAX) 0.4 MG CAPS capsule Take 1 capsule (0.4 mg total) by mouth daily after supper. 30 capsule    No current facility-administered medications for this visit.     Family History    Family History  Problem Relation Age of Onset   Ovarian cancer Sister    Heart disease Father    Brain cancer Brother        Tumor   He indicated that his mother is deceased. He indicated that his father is deceased. He indicated that his sister is deceased. He indicated that his brother is deceased.  Social History    Social History   Socioeconomic History   Marital status: Married    Spouse name: [name]   Number of children: 2   Years of education: BA   Highest education level: Not on file  Occupational History   Occupation: Retired  Tobacco Use   Smoking status: Former    Packs/day: 2.00    Years: 20.00    Total pack years: 40.00    Types: Cigarettes    Quit date: 07/31/1968    Years since quitting: 54.0   Smokeless tobacco: Never  Vaping Use   Vaping Use: Never used  Substance and Sexual Activity   Alcohol use: Not Currently    Alcohol/week: 4.0 standard drinks of alcohol     Types: 4 Glasses of wine per week   Drug use: Never   Sexual activity: Not Currently  Other Topics  Concern   Not on file  Social History Narrative   Lives with wife   Caffeine use: Decaf coffee, tea   Left handed   Social Determinants of Health   Financial Resource Strain: Not on file  Food Insecurity: No Food Insecurity (07/09/2022)   Hunger Vital Sign    Worried About Running Out of Food in the Last Year: Never true    Ran Out of Food in the Last Year: Never true  Transportation Needs: No Transportation Needs (07/09/2022)   PRAPARE - Hydrologist (Medical): No    Lack of Transportation (Non-Medical): No  Physical Activity: Not on file  Stress: Not on file  Social Connections: Not on file  Intimate Partner Violence: Not At Risk (07/09/2022)   Humiliation, Afraid, Rape, and Kick questionnaire    Fear of Current or Ex-Partner: No    Emotionally Abused: No    Physically Abused: No    Sexually Abused: No     Review of Systems    General:  No chills, fever, night sweats or weight changes.  Cardiovascular:  No chest pain, dyspnea on exertion, edema, orthopnea, palpitations, paroxysmal nocturnal dyspnea. Dermatological: No rash, lesions/masses Respiratory: No cough, dyspnea Urologic: No hematuria, dysuria Abdominal:   No nausea, vomiting, diarrhea, bright red blood per rectum, melena, or hematemesis Neurologic:  No visual changes, wkns, changes in mental status. All other systems reviewed and are otherwise negative except as noted above.     Physical Exam    VS:  There were no vitals taken for this visit. , BMI There is no height or weight on file to calculate BMI.     GEN: Well nourished, well developed, in no acute distress. HEENT: normal. Neck: Supple, no JVD, carotid bruits, or masses. Cardiac: RRR, no murmurs, rubs, or gallops. No clubbing, cyanosis, edema.  Radials/DP/PT 2+ and equal bilaterally.  Respiratory:  Respirations regular and  unlabored, clear to auscultation bilaterally. GI: Soft, nontender, nondistended, BS + x 4. MS: no deformity or atrophy. Skin: warm and dry, no rash. Neuro:  Strength and sensation are intact. Psych: Normal affect.  Accessory Clinical Findings    ECG personally reviewed by me today- *** - No acute changes  Lab Results  Component Value Date   WBC 15.5 07/27/2022   HGB 10.5 (A) 07/27/2022   HCT 33 (A) 07/27/2022   MCV 89.3 07/13/2022   PLT 296 07/27/2022   Lab Results  Component Value Date   CREATININE 1.5 (A) 07/27/2022   BUN 30 (A) 07/27/2022   NA 140 07/27/2022   K 3.9 07/27/2022   CL 103 07/27/2022   CO2 23 (A) 07/27/2022   Lab Results  Component Value Date   ALT 21 05/14/2022   AST 19 05/14/2022   ALKPHOS 64 05/14/2022   BILITOT 0.8 05/14/2022   Lab Results  Component Value Date   CHOL 108 11/23/2016   HDL 45 11/23/2016   LDLCALC 44 11/23/2016   TRIG 94 11/23/2016   CHOLHDL 2.4 11/23/2016    Lab Results  Component Value Date   HGBA1C 5.5 11/23/2016    Review of Prior Studies: ABI with VAS Ultrasound 10-22-21:  Right: Resting right ankle-brachial index indicates mild right lower  extremity arterial disease. The right toe-brachial index is abnormal.   Left: Resting left ankle-brachial index indicates moderate left lower  extremity arterial disease. The left toe-brachial index is abnormal.    Assessment & Plan   1.  ***  Current medicines are reviewed at length with the patient today.  I have spent *** min's  dedicated to the care of this patient on the date of this encounter to include pre-visit review of records, assessment, management and diagnostic testing,with shared decision making. Signed, Phill Myron. West Pugh, ANP, [place]   08/15/2022 12:19 PM      Office [phone] Fax [phone]  Notice: This dictation was prepared with Dragon dictation along with smaller phrase technology. Any transcriptional errors that result from this  process are unintentional and may not be corrected upon review.

## 2022-08-18 ENCOUNTER — Ambulatory Visit: Payer: Medicare Other | Attending: Physician Assistant | Admitting: Adult Health

## 2022-08-18 ENCOUNTER — Non-Acute Institutional Stay (SKILLED_NURSING_FACILITY): Payer: Medicare Other | Admitting: Adult Health

## 2022-08-18 ENCOUNTER — Encounter: Payer: Self-pay | Admitting: Adult Health

## 2022-08-18 DIAGNOSIS — N1832 Chronic kidney disease, stage 3b: Secondary | ICD-10-CM

## 2022-08-18 DIAGNOSIS — S72144A Nondisplaced intertrochanteric fracture of right femur, initial encounter for closed fracture: Secondary | ICD-10-CM | POA: Diagnosis not present

## 2022-08-18 DIAGNOSIS — E782 Mixed hyperlipidemia: Secondary | ICD-10-CM | POA: Diagnosis not present

## 2022-08-18 DIAGNOSIS — I10 Essential (primary) hypertension: Secondary | ICD-10-CM | POA: Diagnosis not present

## 2022-08-18 DIAGNOSIS — K279 Peptic ulcer, site unspecified, unspecified as acute or chronic, without hemorrhage or perforation: Secondary | ICD-10-CM

## 2022-08-18 DIAGNOSIS — R413 Other amnesia: Secondary | ICD-10-CM

## 2022-08-18 DIAGNOSIS — I739 Peripheral vascular disease, unspecified: Secondary | ICD-10-CM

## 2022-08-18 DIAGNOSIS — G629 Polyneuropathy, unspecified: Secondary | ICD-10-CM

## 2022-08-18 NOTE — Progress Notes (Signed)
Location:  [facility] Room Number: 115A Place of Service:  SNF [phone]) Provider:  Royal Hawthorn, NP  [name], MD  Patient Care Team: [name], MD as PCP - General (Endocrinology) Lorretta Harp, MD as PCP - Cardiology (Cardiology)  Extended Emergency Contact Information Primary Emergency Contact: Marjie Skiff Address: [address]          [place], [place] [address] Nathan Santiago of [place] Phone: [phone] Mobile Phone: [phone] Relation: Spouse  Code Status:  Full Code  Goals of care: Advanced Directive information    07/28/2022    9:01 AM  Advanced Directives  Does Patient Have a Medical Advance Directive? Yes  Type of [facility] of [place];Out of facility DNR (pink MOST or yellow form)  Does patient want to make changes to medical advance directive? No - Patient declined  Copy of [place] in Chart? Yes - validated most recent copy scanned in chart (See row information)     Chief Complaint  Patient presents with   Acute Visit    Patient is being seen for discharge   Quality Metric Gaps    Discussed the need for AWV and Shingles vaccine    HPI:  Pt is a 89 y.o. male seen today for seen for discharge from [facility] rehab back to independent living.   PMH of CAD, PAD followed with Dr Gwenlyn Found IDA, GERD, HTN, PVD and HLD He had a mechanical fall and subsequent severe right hip pain and inability to bear weight, and was admitted to the hospital for impacted subcapital femoral neck fracture. He underwent right THA by Dr. Alvan Dame on 07/11/2022.  He was discharged back to [facility] for therapy and has made gains. Able to ambulate with a walker. He wants to return home with his wife and she is his primary caregiver. He has some memory loss. She helps him with dressing, bathing, and medications  He denies any hip pain. He is using oxy once per day for heel pain which  is chronic. His wife reports he has heel injections for this outpatient.   He has a hx of urinary retention but reports being able to urinate ok for now. Hx of hydronephrosis and stent.   Has lumbar stenosis and hx of leg weakness with peripheral neuropathy was in rehab in Oct with this issue BP has been elevated during his stay and hydralazine adjusted upward with improvement.   MMSE 25/30 05/2022 Chronic cough on hycodan  Past Surgical History:  Procedure Laterality Date   ABDOMINAL AORTOGRAM W/LOWER EXTREMITY Bilateral 08/05/2018   Procedure: ABDOMINAL AORTOGRAM W/LOWER EXTREMITY;  Surgeon: Lorretta Harp, MD;  Location: [facility];  Service: Cardiovascular;  Laterality: Bilateral;   ABDOMINAL AORTOGRAM W/LOWER EXTREMITY N/A 09/26/2021   Procedure: ABDOMINAL AORTOGRAM W/LOWER EXTREMITY;  Surgeon: Lorretta Harp, MD;  Location: [facility];  Service: Cardiovascular;  Laterality: N/A;   AORTOGRAM  04/24/2016    Abdominal aortogram, bilateral iliac angiogram, bifemoral runoff   CARDIAC CATHETERIZATION  05/12/2005   RCA   carotid doppler  01/24/2013   RICA endarterectomy,left CCA 0-49%; left bulb and prox ICA 50-69%; bilaateral subclavian < 50%   CAROTID ENDARTERECTOMY  11/01/2010   CATARACT EXTRACTION     COLONOSCOPY     CORONARY ANGIOPLASTY  05/18/2005   2 STENTS distal RCA AND PROXIMAL-MID RCA   5 total stents per pt.   CYSTOSCOPY/URETEROSCOPY/HOLMIUM LASER/STENT PLACEMENT Left 01/11/2018   Procedure: LEFT  URETEROSCOPY/HOLMIUM LASER/STENT PLACEMENT;  Surgeon: Ardis Hughs, MD;  Location: [facility];  Service: Urology;  Laterality: Left;   DOPPLER ECHOCARDIOGRAPHY  02/07/2012   EF 55%,SHOWED NO ISCHEMIA    HERNIA REPAIR     umbilical   lower arterial  doppler  02/04/2013   aotra 1.5 x 1.5 cm; distal abd aorta 70-99%,proximal common iliac arteries -very stenotic with increased velocities>50%,may be falsely elevated as a result of residual plaque from the distal aorta  stenosis   lower extremity doppler  June 18 ,2013   ABI'S ABNORMAL, RABI was 0.88 and LABI 0.75 ,with 3-vessel  run off   NM MYOVIEW LTD  MAY 23,2011   showed no significant ischemia;   NM MYOVIEW LTD  04/22/2008   ef 77%,exercise capcity 6 METS ,exaggerated blood pressure response to exercise   PERIPHERAL VASCULAR CATHETERIZATION N/A 04/24/2016   Procedure: Lower Extremity Angiography;  Surgeon: Lorretta Harp, MD;  Location: [facility];  Service: Cardiovascular;  Laterality: N/A;   PERIPHERAL VASCULAR CATHETERIZATION  04/24/2016   Procedure: Peripheral Vascular Intervention;  Surgeon: Lorretta Harp, MD;  Location: [facility];  Service: Cardiovascular;;  Aorta   retrograde central aortic catheterization  05/19/2005   cutting balloon atherectomy, c-circ stenosis with DES STENTING CYPHER   TONSILLECTOMY     TOTAL HIP ARTHROPLASTY Right 07/11/2022   Procedure: TOTAL HIP ARTHROPLASTY ANTERIOR APPROACH;  Surgeon: Paralee Cancel, MD;  Location: [facility];  Service: Orthopedics;  Laterality: Right;   TOTAL KNEE ARTHROPLASTY Left 01/03/2019   Procedure: TOTAL KNEE ARTHROPLASTY;  Surgeon: Netta Cedars, MD;  Location: [facility];  Service: Orthopedics;  Laterality: Left;  with IS block   TRANSURETHRAL RESECTION OF PROSTATE N/A 09/08/2016   Procedure: TRANSURETHRAL RESECTION OF THE PROSTATE (TURP);  Surgeon: Ardis Hughs, MD;  Location: [facility];  Service: Urology;  Laterality: N/A;   TRANSURETHRAL RESECTION OF PROSTATE     10-10-17  Dr. Louis Meckel   TRANSURETHRAL RESECTION OF PROSTATE N/A 10/10/2017   Procedure: TRANSURETHRAL RESECTION OF THE PROSTATE (TURP);  Surgeon: Ardis Hughs, MD;  Location: [facility];  Service: Urology;  Laterality: N/A;   VASECTOMY      Allergies  Allergen Reactions   Lisinopril Cough   Niaspan [Niacin Er] Rash    Outpatient Encounter Medications as of 08/18/2022  Medication Sig   acetaminophen (TYLENOL) 500 MG tablet Take 1 tablet (500 mg total) by mouth 2  (two) times daily as needed. (Patient taking differently: Take 500 mg by mouth 2 (two) times daily as needed for mild pain.)   amLODipine (NORVASC) 10 MG tablet Take 10 mg by mouth daily.   Apoaequorin (PREVAGEN PO) Take 1 tablet by mouth daily.   aspirin EC 81 MG tablet Take 1 tablet (81 mg total) by mouth 2 (two) times a day. (Patient taking differently: Take 81 mg by mouth in the morning and at bedtime.)   atenolol (TENORMIN) 50 MG tablet Take 50 mg by mouth daily.   atorvastatin (LIPITOR) 40 MG tablet Take 40 mg by mouth daily at 6 PM.    Biotin 10 MG CAPS Take 10 mg by mouth daily.   cholecalciferol (VITAMIN D3) 25 MCG (1000 UT) tablet Take 1,000 Units by mouth daily.   clopidogrel (PLAVIX) 75 MG tablet Take 75 mg by mouth every evening.    finasteride (PROSCAR) 5 MG tablet Take 5 mg by mouth daily.   gabapentin (NEURONTIN) 100 MG capsule Take 100 mg by mouth 3 (three) times  daily.   Glucosamine-Chondroitin 750-600 MG TABS Take 1 tablet by mouth 2 (two) times daily. Take 1 tablet PO 2 times daily.   hydrALAZINE (APRESOLINE) 50 MG tablet Take 1 tablet (50 mg total) by mouth 4 (four) times daily.   hydrochlorothiazide (MICROZIDE) 12.5 MG capsule Take 2 capsules (25 mg total) by mouth daily.   HYDROcodone bit-homatropine (HYCODAN) 5-1.5 MG/5ML syrup Take 5 mLs by mouth every 6 (six) hours as needed for cough.   iron polysaccharides (NIFEREX) 150 MG capsule Take 150 mg by mouth every Monday, Wednesday, and Friday.   losartan (COZAAR) 50 MG tablet Take 50 mg by mouth daily.   Misc Natural Products (GLUCOSAMINE CHONDROITIN TRIPLE) TABS Take 1 tablet by mouth 2 (two) times daily.   Multiple Vitamin (MULTIVITAMIN WITH MINERALS) TABS tablet Take 1 tablet by mouth daily.   oxyCODONE (OXY IR/ROXICODONE) 5 MG immediate release tablet Take 1 tablet (5 mg total) by mouth every 6 (six) hours as needed for severe pain.   pantoprazole (PROTONIX) 40 MG tablet TAKE 1 TABLET BY MOUTH TWICE A DAY (Patient taking  differently: Take 40 mg by mouth at bedtime.)   polyethylene glycol (MIRALAX) 17 g packet Take 17 g by mouth daily as needed.   potassium chloride SA (K-DUR,KLOR-CON) 20 MEQ tablet Take 20 mEq by mouth every evening.    senna (SENOKOT) 8.6 MG TABS tablet Take 1 tablet (8.6 mg total) by mouth 2 (two) times daily.   tamsulosin (FLOMAX) 0.4 MG CAPS capsule Take 1 capsule (0.4 mg total) by mouth daily after supper.   cephALEXin (KEFLEX) 500 MG capsule Take 1 capsule (500 mg total) by mouth 3 (three) times daily. (Patient not taking: Reported on 08/18/2022)   No facility-administered encounter medications on file as of 08/18/2022.    Review of Systems  Constitutional:  Negative for activity change, appetite change, chills, diaphoresis, fatigue, fever and unexpected weight change.  Respiratory:  Negative for cough, shortness of breath, wheezing and stridor.   Cardiovascular:  Negative for chest pain, palpitations and leg swelling.  Gastrointestinal:  Negative for abdominal distention, abdominal pain, constipation and diarrhea.  Genitourinary:  Negative for difficulty urinating and dysuria.  Musculoskeletal:  Positive for arthralgias (heel pain at times) and gait problem. Negative for back pain, joint swelling and myalgias.  Neurological:  Negative for dizziness, seizures, syncope, facial asymmetry, speech difficulty, weakness and headaches.  Hematological:  Negative for adenopathy. Does not bruise/bleed easily.  Psychiatric/Behavioral:  Negative for agitation, behavioral problems and confusion.        Memory loss.     Immunization History  Administered Date(s) Administered   Fluad Quad(high Dose 65+) 05/02/2022   Influenza, High Dose Seasonal PF 03/31/2017, 03/29/2018   Influenza,inj,Quad PF,6+ Mos 04/25/2016, 05/06/2018   Influenza,inj,quad, With Preservative 05/26/2019   Influenza-Unspecified 05/14/2001, 05/31/2001, 05/14/2002, 05/15/2002, 05/18/2003, 05/25/2004, 04/30/2005, 05/29/2006,  05/01/2007, 05/11/2008, 05/31/2009, 05/31/2010, 04/30/2013, 05/31/2014, 03/31/2021   Moderna Covid-19 Vaccine Bivalent Booster 73yr & up 08/31/2021   Moderna SARS-COV2 Booster Vaccination 06/15/2020, 11/19/2020, 01/02/2022   Moderna Sars-Covid-2 Vaccination 08/12/2019, 09/10/2019, 05/31/2022   Pneumococcal Polysaccharide-23 05/02/2000, 09/24/2018   Td 09/08/2013   Pertinent  Health Maintenance Due  Topic Date Due   INFLUENZA VACCINE  Completed      07/11/2022    7:25 PM 07/12/2022    9:20 AM 07/12/2022    7:10 PM 07/13/2022    7:10 AM 07/28/2022    9:00 AM  Fall Risk  (RETIRED) Patient Fall Risk Level High fall risk High fall risk  High fall risk High fall risk High fall risk   Functional Status Survey:    Vitals:   08/18/22 0823  BP: (!) 149/68  Pulse: 69  Resp: 14  Temp: 98.3 F (36.8 C)  TempSrc: Temporal  SpO2: 94%  Weight: 161 lb 9.6 oz (73.3 kg)  Height: '5\' 4"'$  (1.626 m)   Body mass index is 27.74 kg/m. Physical Exam Vitals reviewed.  Constitutional:      General: He is not in acute distress.    Appearance: He is not diaphoretic.  HENT:     Head: Normocephalic and atraumatic.  Neck:     Thyroid: No thyromegaly.     Vascular: No JVD.     Trachea: No tracheal deviation.  Cardiovascular:     Rate and Rhythm: Normal rate and regular rhythm.     Heart sounds: No murmur heard. Pulmonary:     Effort: Pulmonary effort is normal. No respiratory distress.     Breath sounds: Normal breath sounds. No wheezing.  Abdominal:     General: Bowel sounds are normal. There is no distension.     Palpations: Abdomen is soft.     Tenderness: There is no abdominal tenderness.  Musculoskeletal:     Right lower leg: No edema.     Left lower leg: No edema.  Lymphadenopathy:     Cervical: No cervical adenopathy.  Skin:    General: Skin is warm and dry.  Neurological:     Mental Status: He is alert and oriented to person, place, and time.     Cranial Nerves: No cranial  nerve deficit.     Labs reviewed: Recent Labs    07/11/22 0334 07/12/22 0329 07/12/22 0330 07/13/22 0335 07/20/22 0000 07/27/22 0000  NA 142 143 143 145 141 140  K 3.8 4.6 4.6 4.0 4.2 3.9  CL 108 110 110 115* 107 103  CO2 '26 25 24 25 '$ 23* 23*  GLUCOSE 129* 179* 181* 186*  --   --   BUN 16 26* 24* 33* 33* 30*  CREATININE 0.84 1.32* 1.34* 1.04 1.0 1.5*  CALCIUM 8.7* 8.7* 8.7* 8.8* 9.0 9.4  MG 2.1 2.4  --  2.2  --   --   PHOS 3.3  --  3.4 2.8  --   --    Recent Labs    05/13/22 1510 05/13/22 2334 05/14/22 0452 07/11/22 0334 07/12/22 0330 07/13/22 0335  AST '22 17 19  '$ --   --   --   ALT '21 22 21  '$ --   --   --   ALKPHOS 70 66 64  --   --   --   BILITOT 0.7 0.9 0.8  --   --   --   PROT 6.7 6.3* 6.1*  --   --   --   ALBUMIN 3.5 3.3* 3.2* 3.3* 2.7* 2.9*   Recent Labs    05/15/22 0613 05/16/22 0110 07/09/22 1450 07/11/22 0334 07/12/22 0329 07/13/22 0335 07/20/22 0000 07/27/22 0000  WBC 7.3 8.7 9.0 10.2 14.4* 16.0* 11.0 15.5  NEUTROABS 5.0 6.4 7.3  --   --   --   --   --   HGB 13.5 12.6* 12.6* 12.2* 10.5* 9.6* 10.4* 10.5*  HCT 40.6 38.4* 38.5* 37.6* 32.6* 30.0* 32* 33*  MCV 83.2 83.3 87.1 88.3 89.6 89.3  --   --   PLT 195 189 257 202 201 205 258 296   Lab Results  Component Value Date   TSH  1.104 05/13/2022   Lab Results  Component Value Date   HGBA1C 5.5 11/23/2016   Lab Results  Component Value Date   CHOL 108 11/23/2016   HDL 45 11/23/2016   LDLCALC 44 11/23/2016   TRIG 94 11/23/2016   CHOLHDL 2.4 11/23/2016    Significant Diagnostic Results in last 30 days:  No results found.  Assessment/Plan   1. Closed nondisplaced intertrochanteric fracture of right femur, initial encounter [facility]) S/p repair Has worked with therapy and made gains and is ready for discharge Will d/c oxy Just use tylenol for pain  2. Essential hypertension Improved, f/u with pcp   3. Mixed hyperlipidemia With PAD On lipitor   4. Peptic ulcer Continue protonix   5.  Stage 3b chronic kidney disease (HCC) Slight worsening. F/U with PCP   6. Neuropathy Noted to both feet affecting gait   7. Peripheral arterial disease ([place]) Followed by Dr Gwenlyn Found   8. Memory loss Progressive Needs reminders and assistance May benefit from a higher level of care in the near future.     Ok to discharge home Pt was not given prescriptions. He will be provided with meds purchased here at [facility] from the med care. Discussed his care with his wife and she is ready for him to come home. She does have some apprehension about taking care of him and his meds. I encouraged her to go over the list and reach out to the clinic nurse if there are concerns. I also let her know med management is available.  He has a walker.  Will f/u with PCP regarding BP F/U ortho as indicated  Discharge review, assessment, plan, and coordination took >30 min

## 2022-08-28 ENCOUNTER — Ambulatory Visit (HOSPITAL_COMMUNITY)
Admission: RE | Admit: 2022-08-28 | Discharge: 2022-08-28 | Disposition: A | Discharge status: Home / Self Care | Payer: Medicare Other | Source: Ambulatory Visit | Attending: Cardiovascular Disease | Admitting: Cardiovascular Disease

## 2022-08-28 DIAGNOSIS — I6523 Occlusion and stenosis of bilateral carotid arteries: Secondary | ICD-10-CM | POA: Diagnosis present

## 2022-09-01 ENCOUNTER — Other Ambulatory Visit: Payer: Self-pay

## 2022-09-01 DIAGNOSIS — I6523 Occlusion and stenosis of bilateral carotid arteries: Secondary | ICD-10-CM

## 2022-10-12 ENCOUNTER — Encounter: Payer: Self-pay | Admitting: Adult Health

## 2022-10-12 ENCOUNTER — Non-Acute Institutional Stay (SKILLED_NURSING_FACILITY): Payer: Medicare Other | Admitting: Adult Health

## 2022-10-12 DIAGNOSIS — K279 Peptic ulcer, site unspecified, unspecified as acute or chronic, without hemorrhage or perforation: Secondary | ICD-10-CM | POA: Diagnosis not present

## 2022-10-12 DIAGNOSIS — I10 Essential (primary) hypertension: Secondary | ICD-10-CM | POA: Diagnosis not present

## 2022-10-12 DIAGNOSIS — D509 Iron deficiency anemia, unspecified: Secondary | ICD-10-CM | POA: Diagnosis not present

## 2022-10-12 DIAGNOSIS — R413 Other amnesia: Secondary | ICD-10-CM

## 2022-10-12 DIAGNOSIS — F5101 Primary insomnia: Secondary | ICD-10-CM

## 2022-10-12 DIAGNOSIS — R269 Unspecified abnormalities of gait and mobility: Secondary | ICD-10-CM

## 2022-10-12 DIAGNOSIS — I739 Peripheral vascular disease, unspecified: Secondary | ICD-10-CM

## 2022-10-12 DIAGNOSIS — E782 Mixed hyperlipidemia: Secondary | ICD-10-CM

## 2022-10-12 DIAGNOSIS — N1831 Chronic kidney disease, stage 3a: Secondary | ICD-10-CM

## 2022-10-12 DIAGNOSIS — G629 Polyneuropathy, unspecified: Secondary | ICD-10-CM

## 2022-10-12 NOTE — Progress Notes (Unsigned)
Location:  [facility] Room Number: 149 A Place of Service:  SNF [id number]) Provider:  Royal Hawthorn, NP   Patient Care Team: [name], MD as PCP - General (Endocrinology) Lorretta Harp, MD as PCP - Cardiology (Cardiology)  Extended Emergency Contact Information Primary Emergency Contact: Marjie Skiff Address: [address]          [place], [place] [address] Johnnette Litter of [place] Phone: [phone] Mobile Phone: [phone] Relation: Spouse  Code Status:  Changed to DNR Goals of care: Advanced Directive information    10/12/2022    2:34 PM  Advanced Directives  Does Patient Have a Medical Advance Directive? Yes  Type of Paramedic of [place];Living will  Does patient want to make changes to medical advance directive? No - Patient declined  Copy of [place] in Chart? Yes - validated most recent copy scanned in chart (See row information)     Chief Complaint  Patient presents with   Acute Visit    Falls     HPI:  Pt is a 89 y.o. male seen today for an acute visit for falls, leading to admission to skilled care. Acute f/u after admission.   PMH of CAD, PAD followed with Dr Gwenlyn Found IDA, GERD, HTN, PVD and HLD He had a mechanical fall and subsequent severe right hip pain and inability to bear weight, and impacted subcapital femoral neck fracture. He underwent right THA by Dr. Alvan Dame on 07/11/2022.  He moved to skilled care due to increased care needs and falls. He previously lived in [place] with his wife as his care taker. He walks with a walker short distances and uses a scooter for long distances. Now is changing to a PWC.  Needs assistance with bathing, dressing. Able to feed himself. Very pleasant. Incontinent  He has a hx of urinary retention but reports being able to urinate ok for now. Hx of hydronephrosis and stent.   Has lumbar stenosis and hx of leg weakness with  peripheral neuropathy   Difficult to control BP    MMSE 27/30 09/2022  Chronic cough on hycodan  Reports some difficulty sleeping  Hx of AAA ICA stenosis and PAD, followed by Dr Gwenlyn Found. Last carotid US showing bilateral ICA stenosis with progression. Repeat ordered 12 months.   Past Medical History:  Diagnosis Date   AAA (abdominal aortic aneurysm) ([place])    asymptomatic   AAA (abdominal aortic aneurysm) ([place]) 2010   peripheral  angiogram-- bilateral SFA DISEASE  and 60 to 70% infrarenaal abd. aortic stenosis with 15 -mm gradient   Adenomatous colon polyp 11/2003   CAD (coronary artery disease)    Gait abnormality 02/13/2017   Gastroparesis    pt denies   GERD (gastroesophageal reflux disease)    w/ LPR   History of kidney stones    x2   Hyperlipidemia    Hypertension    IDA (iron deficiency anemia)    Peripheral arterial disease ([place])    RCEA  by Dr Lemar Livings   PUD (peptic ulcer disease)    pt unaware   Vertigo    Past Surgical History:  Procedure Laterality Date   ABDOMINAL AORTOGRAM W/LOWER EXTREMITY Bilateral 08/05/2018   Procedure: ABDOMINAL AORTOGRAM W/LOWER EXTREMITY;  Surgeon: Lorretta Harp, MD;  Location: [facility];  Service: Cardiovascular;  Laterality: Bilateral;   ABDOMINAL AORTOGRAM W/LOWER EXTREMITY N/A 09/26/2021   Procedure: ABDOMINAL AORTOGRAM W/LOWER EXTREMITY;  Surgeon: [name]  J, MD;  Location: [facility];  Service: Cardiovascular;  Laterality: N/A;   AORTOGRAM  04/24/2016    Abdominal aortogram, bilateral iliac angiogram, bifemoral runoff   CARDIAC CATHETERIZATION  05/12/2005   RCA   carotid doppler  01/24/2013   RICA endarterectomy,left CCA 0-49%; left bulb and prox ICA 50-69%; bilaateral subclavian < 50%   CAROTID ENDARTERECTOMY  11/01/2010   CATARACT EXTRACTION     COLONOSCOPY     CORONARY ANGIOPLASTY  05/18/2005   2 STENTS distal RCA AND PROXIMAL-MID RCA   5 total stents per pt.   CYSTOSCOPY/URETEROSCOPY/HOLMIUM  LASER/STENT PLACEMENT Left 01/11/2018   Procedure: LEFT URETEROSCOPY/HOLMIUM LASER/STENT PLACEMENT;  Surgeon: Ardis Hughs, MD;  Location: [facility];  Service: Urology;  Laterality: Left;   DOPPLER ECHOCARDIOGRAPHY  02/07/2012   EF 55%,SHOWED NO ISCHEMIA    HERNIA REPAIR     umbilical   lower arterial  doppler  02/04/2013   aotra 1.5 x 1.5 cm; distal abd aorta 70-99%,proximal common iliac arteries -very stenotic with increased velocities>50%,may be falsely elevated as a result of residual plaque from the distal aorta stenosis   lower extremity doppler  June 18 ,2013   ABI'S ABNORMAL, RABI was 0.88 and LABI 0.75 ,with 3-vessel  run off   NM MYOVIEW LTD  MAY 23,2011   showed no significant ischemia;   NM MYOVIEW LTD  04/22/2008   ef 77%,exercise capcity 6 METS ,exaggerated blood pressure response to exercise   PERIPHERAL VASCULAR CATHETERIZATION N/A 04/24/2016   Procedure: Lower Extremity Angiography;  Surgeon: Lorretta Harp, MD;  Location: [facility];  Service: Cardiovascular;  Laterality: N/A;   PERIPHERAL VASCULAR CATHETERIZATION  04/24/2016   Procedure: Peripheral Vascular Intervention;  Surgeon: Lorretta Harp, MD;  Location: [facility];  Service: Cardiovascular;;  Aorta   retrograde central aortic catheterization  05/19/2005   cutting balloon atherectomy, c-circ stenosis with DES STENTING CYPHER   TONSILLECTOMY     TOTAL HIP ARTHROPLASTY Right 07/11/2022   Procedure: TOTAL HIP ARTHROPLASTY ANTERIOR APPROACH;  Surgeon: Paralee Cancel, MD;  [facility]: [facility];  Service: Orthopedics;  Laterality: Right;   TOTAL KNEE ARTHROPLASTY Left 01/03/2019   Procedure: TOTAL KNEE ARTHROPLASTY;  Surgeon: Netta Cedars, MD;  Location: [facility];  Service: Orthopedics;  Laterality: Left;  with IS block   TRANSURETHRAL RESECTION OF PROSTATE N/A 09/08/2016   Procedure: TRANSURETHRAL RESECTION OF THE PROSTATE (TURP);  Surgeon: Ardis Hughs, MD;  [facility];  Service: Urology;   Laterality: N/A;   TRANSURETHRAL RESECTION OF PROSTATE     10-10-17  Dr. Louis Meckel   TRANSURETHRAL RESECTION OF PROSTATE N/A 10/10/2017   Procedure: TRANSURETHRAL RESECTION OF THE PROSTATE (TURP);  Surgeon: Ardis Hughs, MD;  [facility];  Service: Urology;  Laterality: N/A;   VASECTOMY      Allergies  Allergen Reactions   Lisinopril Cough   Niaspan [Niacin Er] Rash    Outpatient Encounter Medications as of 10/12/2022  Medication Sig   acetaminophen (TYLENOL) 500 MG tablet Take 1 tablet (500 mg total) by mouth 2 (two) times daily as needed.   amLODipine (NORVASC) 10 MG tablet Take 10 mg by mouth daily.   Apoaequorin (PREVAGEN PO) Take 1 tablet by mouth daily.   atenolol (TENORMIN) 50 MG tablet Take 50 mg by mouth daily.   atorvastatin (LIPITOR) 40 MG tablet Take 40 mg by mouth daily at 6 PM.    Biotin 10 MG CAPS Take 10 mg by mouth daily.  Camphor-Menthol-Methyl Sal (SALONPAS) 3.08-05-08 % PTCH Apply 1 patch topically as needed. Special Instructions: Salonpas patch (on in am, off in pm) as needed to affected area   cholecalciferol (VITAMIN D3) 25 MCG (1000 UT) tablet Take 1,000 Units by mouth daily.   clopidogrel (PLAVIX) 75 MG tablet Take 75 mg by mouth every evening.    finasteride (PROSCAR) 5 MG tablet Take 5 mg by mouth daily.   gabapentin (NEURONTIN) 100 MG capsule Take 100 mg by mouth 3 (three) times daily.   Glucosamine-Chondroitin 750-600 MG TABS Take 1 tablet by mouth 2 (two) times daily.   hydrALAZINE (APRESOLINE) 25 MG tablet Take 25 mg by mouth every 8 (eight) hours as needed. See other listing   hydrALAZINE (APRESOLINE) 50 MG tablet Take 1 tablet (50 mg total) by mouth 4 (four) times daily.   hydrochlorothiazide (MICROZIDE) 12.5 MG capsule Take 2 capsules (25 mg total) by mouth daily.   HYDROcodone bit-homatropine (HYCODAN) 5-1.5 MG/5ML syrup Take 5 mLs by mouth every 6 (six) hours as needed for cough.   iron polysaccharides (NIFEREX) 150 MG capsule Take 150 mg by  mouth every Monday, Wednesday, and Friday.   losartan (COZAAR) 50 MG tablet Take 50 mg by mouth daily.   melatonin 5 MG TABS Take 1 tablet (5 mg total) by mouth at bedtime.   Misc Natural Products (GLUCOSAMINE CHONDROITIN TRIPLE) TABS Take 1 tablet by mouth 2 (two) times daily.   Multiple Vitamin (MULTIVITAMIN WITH MINERALS) TABS tablet Take 1 tablet by mouth daily.   pantoprazole (PROTONIX) 40 MG tablet Take 40 mg by mouth 2 (two) times daily.   polyethylene glycol (MIRALAX) 17 g packet Take 17 g by mouth daily as needed.   potassium chloride SA (K-DUR,KLOR-CON) 20 MEQ tablet Take 20 mEq by mouth every evening.    senna (SENOKOT) 8.6 MG TABS tablet Take 1 tablet (8.6 mg total) by mouth 2 (two) times daily.   tamsulosin (FLOMAX) 0.4 MG CAPS capsule Take 1 capsule (0.4 mg total) by mouth daily after supper.   [DISCONTINUED] aspirin EC 81 MG tablet Take 1 tablet (81 mg total) by mouth 2 (two) times a day. (Patient taking differently: Take 81 mg by mouth in the morning and at bedtime.)   [DISCONTINUED] cephALEXin (KEFLEX) 500 MG capsule Take 1 capsule (500 mg total) by mouth 3 (three) times daily. (Patient not taking: Reported on 08/18/2022)   [DISCONTINUED] pantoprazole (PROTONIX) 40 MG tablet TAKE 1 TABLET BY MOUTH TWICE A DAY (Patient taking differently: Take 40 mg by mouth at bedtime.)   No facility-administered encounter medications on file as of 10/12/2022.    Review of Systems  Constitutional:  Positive for activity change (falling more). Negative for appetite change, chills, diaphoresis, fatigue, fever and unexpected weight change.  Respiratory:  Positive for cough (chronic dry). Negative for shortness of breath, wheezing and stridor.   Cardiovascular:  Negative for chest pain, palpitations and leg swelling.  Gastrointestinal:  Negative for abdominal distention, abdominal pain, constipation and diarrhea.  Genitourinary:  Negative for difficulty urinating and dysuria.  Musculoskeletal:   Positive for gait problem. Negative for arthralgias, back pain, joint swelling and myalgias.  Neurological:  Positive for weakness and numbness. Negative for dizziness, seizures, syncope, facial asymmetry, speech difficulty and headaches.  Hematological:  Negative for adenopathy. Does not bruise/bleed easily.  Psychiatric/Behavioral:  Positive for sleep disturbance. Negative for agitation, behavioral problems, confusion, self-injury and suicidal ideas.        Memory loss    Immunization History  Administered Date(s) Administered   Fluad Quad(high Dose 65+) 05/02/2022   Influenza, High Dose Seasonal PF 03/31/2017, 03/29/2018   Influenza,inj,Quad PF,6+ Mos 04/25/2016, 05/06/2018   Influenza,inj,quad, With Preservative 05/26/2019   Influenza-Unspecified 05/14/2001, 05/31/2001, 05/14/2002, 05/15/2002, 05/18/2003, 05/25/2004, 04/30/2005, 05/29/2006, 05/01/2007, 05/11/2008, 05/31/2009, 05/31/2010, 04/30/2013, 05/31/2014, 03/31/2021   Moderna Covid-19 Vaccine Bivalent Booster 53yrs & up 08/31/2021   Moderna SARS-COV2 Booster Vaccination 06/15/2020, 11/19/2020, 01/02/2022   Moderna Sars-Covid-2 Vaccination 08/12/2019, 09/10/2019, 05/31/2022   PFIZER(Purple Top)SARS-COV-2 Vaccination 06/03/2021   Pneumococcal Polysaccharide-23 05/02/2000, 09/24/2018   Rsv, Bivalent, Protein Subunit Rsvpref,pf [place] Lance) 07/19/2022   Td 09/08/2013   Pertinent  Health Maintenance Due  Topic Date Due   INFLUENZA VACCINE  Completed      07/12/2022    9:20 AM 07/12/2022    7:10 PM 07/13/2022    7:10 AM 07/28/2022    9:00 AM 10/14/2022    7:21 AM  Fall Risk  Falls in the past year?     1  Was there an injury with Fall?     1  Fall Risk Category Calculator     3  (RETIRED) Patient Fall Risk Level High fall risk High fall risk High fall risk High fall risk   Fall risk Follow up     Falls evaluation completed   Functional Status Survey:    Vitals:   10/12/22 1432 10/12/22 1436  BP: (!) 163/63 (!) 164/64   Pulse: 67   Temp: (!) 97.5 F (36.4 C)   SpO2: 95%   Weight: 161 lb (73 kg)   Height: 5\' 4"  (1.626 m)    Body mass index is 27.64 kg/m. Physical Exam Vitals and nursing note reviewed.  Constitutional:      General: He is not in acute distress.    Appearance: He is not diaphoretic.  HENT:     Head: Normocephalic and atraumatic.     Mouth/Throat:     Mouth: Mucous membranes are moist.     Pharynx: Oropharynx is clear.  Eyes:     Conjunctiva/sclera: Conjunctivae normal.     Pupils: Pupils are equal, round, and reactive to light.  Neck:     Thyroid: No thyromegaly.     Vascular: No JVD.     Trachea: No tracheal deviation.  Cardiovascular:     Rate and Rhythm: Normal rate and regular rhythm.     Heart sounds: No murmur heard. Pulmonary:     Effort: Pulmonary effort is normal. No respiratory distress.     Breath sounds: Normal breath sounds. No wheezing.  Abdominal:     General: Bowel sounds are normal. There is no distension.     Palpations: Abdomen is soft.     Tenderness: There is no abdominal tenderness.  Musculoskeletal:     Right lower leg: No edema.     Left lower leg: No edema.  Lymphadenopathy:     Cervical: No cervical adenopathy.  Skin:    General: Skin is warm and dry.  Neurological:     Mental Status: He is alert and oriented to person, place, and time.     Cranial Nerves: No cranial nerve deficit.     Labs reviewed: Recent Labs    07/11/22 0334 07/12/22 0329 07/12/22 0330 07/13/22 0335 07/20/22 0000 07/27/22 0000  NA 142 143 143 145 141 140  K 3.8 4.6 4.6 4.0 4.2 3.9  CL 108 110 110 115* 107 103  CO2 26 25 24 25  23* 23*  GLUCOSE 129* 179* 181* 186*  --   --  BUN 16 26* 24* 33* 33* 30*  CREATININE 0.84 1.32* 1.34* 1.04 1.0 1.5*  CALCIUM 8.7* 8.7* 8.7* 8.8* 9.0 9.4  MG 2.1 2.4  --  2.2  --   --   PHOS 3.3  --  3.4 2.8  --   --    Recent Labs    05/13/22 1510 05/13/22 2334 05/14/22 0452 07/11/22 0334 07/12/22 0330 07/13/22 0335  AST  22 17 19   --   --   --   ALT 21 22 21   --   --   --   ALKPHOS 70 66 64  --   --   --   BILITOT 0.7 0.9 0.8  --   --   --   PROT 6.7 6.3* 6.1*  --   --   --   ALBUMIN 3.5 3.3* 3.2* 3.3* 2.7* 2.9*   Recent Labs    05/15/22 0613 05/16/22 0110 07/09/22 1450 07/11/22 0334 07/12/22 0329 07/13/22 0335 07/20/22 0000 07/27/22 0000  WBC 7.3 8.7 9.0 10.2 14.4* 16.0* 11.0 15.5  NEUTROABS 5.0 6.4 7.3  --   --   --   --   --   HGB 13.5 12.6* 12.6* 12.2* 10.5* 9.6* 10.4* 10.5*  HCT 40.6 38.4* 38.5* 37.6* 32.6* 30.0* 32* 33*  MCV 83.2 83.3 87.1 88.3 89.6 89.3  --   --   PLT 195 189 257 202 201 205 258 296   Lab Results  Component Value Date   TSH 1.104 05/13/2022   Lab Results  Component Value Date   HGBA1C 5.5 11/23/2016   Lab Results  Component Value Date   CHOL 108 11/23/2016   HDL 45 11/23/2016   LDLCALC 44 11/23/2016   TRIG 94 11/23/2016   CHOLHDL 2.4 11/23/2016    Significant Diagnostic Results in last 30 days:  No results found.  Assessment/Plan  Gait abnormality  Due to hx of spinal stenosis, neuropathy, and knee OA with falls Also with memory loss Skilled care is the appropriate setting for him.   2. Essential hypertension Slightly above goal Will monitor over the weekend with provider f/u Monday.   3. Mixed hyperlipidemia With PAD On lipitor   4. Peptic ulcer Continue protonix   5. Stage 3b chronic kidney disease ([place]) Continue to periodically monitor BMP and avoid nephrotoxic agents  6. Neuropathy Noted to both feet affecting gait   7. Peripheral arterial disease ([place]) Followed by Dr Gwenlyn Found   8. Memory loss Progressive Needs reminders and assistance May benefit from a higher level of care in the near future.   9. Insomnia Melatonin 5 mg qhs  Family/ staff Communication: resident   Labs/tests ordered:  , had labs at Dr Forde Dandy office

## 2022-10-13 ENCOUNTER — Encounter: Payer: Self-pay | Admitting: Adult Health

## 2022-10-13 MED ORDER — MELATONIN 5 MG PO TABS: 5.0000 mg | ORAL_TABLET | Freq: Every day | ORAL | 0 refills | Status: DC

## 2022-10-16 ENCOUNTER — Other Ambulatory Visit: Payer: Self-pay | Admitting: Adult Health

## 2022-10-16 ENCOUNTER — Non-Acute Institutional Stay (SKILLED_NURSING_FACILITY): Payer: Medicare Other | Admitting: Internal Medicine

## 2022-10-16 DIAGNOSIS — G629 Polyneuropathy, unspecified: Secondary | ICD-10-CM

## 2022-10-16 DIAGNOSIS — E782 Mixed hyperlipidemia: Secondary | ICD-10-CM

## 2022-10-16 DIAGNOSIS — D509 Iron deficiency anemia, unspecified: Secondary | ICD-10-CM

## 2022-10-16 DIAGNOSIS — R195 Other fecal abnormalities: Secondary | ICD-10-CM | POA: Diagnosis not present

## 2022-10-16 DIAGNOSIS — R269 Unspecified abnormalities of gait and mobility: Secondary | ICD-10-CM

## 2022-10-16 DIAGNOSIS — G3184 Mild cognitive impairment, so stated: Secondary | ICD-10-CM

## 2022-10-16 DIAGNOSIS — N1831 Chronic kidney disease, stage 3a: Secondary | ICD-10-CM

## 2022-10-16 DIAGNOSIS — I10 Essential (primary) hypertension: Secondary | ICD-10-CM | POA: Diagnosis not present

## 2022-10-16 DIAGNOSIS — K279 Peptic ulcer, site unspecified, unspecified as acute or chronic, without hemorrhage or perforation: Secondary | ICD-10-CM | POA: Diagnosis not present

## 2022-10-16 MED ORDER — HYDROCODONE BIT-HOMATROP MBR 5-1.5 MG/5ML PO SOLN: 5.0000 mL | Freq: Four times a day (QID) | ORAL | 0 refills | Status: DC | PRN

## 2022-10-17 LAB — HEPATIC FUNCTION PANEL
ALT: 15 U/L (ref 10–40)
AST: 16 (ref 14–40)
Alkaline Phosphatase: 111 (ref 25–125)
Bilirubin, Total: 0.4

## 2022-10-17 LAB — COMPREHENSIVE METABOLIC PANEL
Albumin: 4.1 (ref 3.5–5.0)
Calcium: 9.6 (ref 8.7–10.7)

## 2022-10-17 LAB — CBC: RBC: 4.31 (ref 3.87–5.11)

## 2022-10-17 LAB — BASIC METABOLIC PANEL
BUN: 21 (ref 4–21)
CO2: 26 — AB (ref 13–22)
Chloride: 105 (ref 99–108)
Creatinine: 1 (ref 0.6–1.3)
Glucose: 98
Potassium: 4 mEq/L (ref 3.5–5.1)
Sodium: 142 (ref 137–147)

## 2022-10-17 LAB — CBC AND DIFFERENTIAL
HCT: 36 — AB (ref 41–53)
Hemoglobin: 12.6 — AB (ref 13.5–17.5)
Platelets: 217 10*3/uL (ref 150–400)
WBC: 7.6

## 2022-10-17 LAB — LIPID PANEL
Cholesterol: 133 (ref 0–200)
HDL: 49 (ref 35–70)
LDL Cholesterol: 65
Triglycerides: 122 (ref 40–160)

## 2022-10-20 ENCOUNTER — Encounter: Payer: Self-pay | Admitting: Internal Medicine

## 2022-10-20 NOTE — Progress Notes (Signed)
Provider:   Location:  Occupational psychologist of Service:  SNF (31)  PCP: Virgie Dad, MD Patient Care Team: Virgie Dad, MD as PCP - General (Internal Medicine) Lorretta Harp, MD as PCP - Cardiology (Cardiology)  Extended Emergency Contact Information Primary Emergency Contact: Marjie Skiff Address: [address]          [place], [place] [address] Johnnette Litter of [place] Phone: [phone] Mobile Phone: [phone] Relation: Spouse  Code Status: DNR Goals of Care: Advanced Directive information    10/12/2022    2:34 PM  Advanced Directives  Does Patient Have a Medical Advance Directive? Yes  Type of Paramedic of [place];Living will  Does patient want to make changes to medical advance directive? No - Patient declined  Copy of [place] in Chart? Yes - validated most recent copy scanned in chart (See row information)      Chief Complaint  Patient presents with   ReAdmit To SNF    HPI: Patient is a 89 y.o. male seen today for admission to SNF  He was living with his wife in [place] in [place] Admitted in the hospital from 12/10-12/14 for Right Femur Fracture   s/p Right THA by Dr Alvan Dame on 07/11/2022  Patient has h/o hypertension, hyperlipidemia, PAD, CAD,BP  Also h/o Urinary Retention with Hydronephrosis and stent  Hx of AAA ICA stenosis and PAD, followed by Dr Gwenlyn Found. Last carotid US showing bilateral ICA stenosis with progressio   Mild Cognitive impairment MMSE 27/30 in 03/24 Needing more help and Continue sot be fall risk Wife was unable to take care of him and decided to move him in SNF Has chronic  Cough  Also Neuropathy  Doing well in SNF Walks with his walker Needs assistant with ADLS Only c/o Loose stools  No Abdomina l pain no Blood  no nausea   Past Medical History:  Diagnosis Date   AAA (abdominal aortic aneurysm) ([place])    asymptomatic   AAA (abdominal aortic  aneurysm) ([place]) 2010   peripheral  angiogram-- bilateral SFA DISEASE  and 60 to 70% infrarenaal abd. aortic stenosis with 15 -mm gradient   Adenomatous colon polyp 11/2003   CAD (coronary artery disease)    Gait abnormality 02/13/2017   Gastroparesis    pt denies   GERD (gastroesophageal reflux disease)    w/ LPR   History of kidney stones    x2   Hyperlipidemia    Hypertension    IDA (iron deficiency anemia)    Peripheral arterial disease ([place])    RCEA  by Dr Lemar Livings   PUD (peptic ulcer disease)    pt unaware   Vertigo    Past Surgical History:  Procedure Laterality Date   ABDOMINAL AORTOGRAM W/LOWER EXTREMITY Bilateral 08/05/2018   Procedure: ABDOMINAL AORTOGRAM W/LOWER EXTREMITY;  Surgeon: Lorretta Harp, MD;  Location: [facility];  Service: Cardiovascular;  Laterality: Bilateral;   ABDOMINAL AORTOGRAM W/LOWER EXTREMITY N/A 09/26/2021   Procedure: ABDOMINAL AORTOGRAM W/LOWER EXTREMITY;  Surgeon: Lorretta Harp, MD;  Location: [facility];  Service: Cardiovascular;  Laterality: N/A;   AORTOGRAM  04/24/2016    Abdominal aortogram, bilateral iliac angiogram, bifemoral runoff   CARDIAC CATHETERIZATION  05/12/2005   RCA   carotid doppler  01/24/2013   RICA endarterectomy,left CCA 0-49%; left bulb and prox ICA 50-69%; bilaateral subclavian < 50%   CAROTID ENDARTERECTOMY  11/01/2010   CATARACT EXTRACTION  COLONOSCOPY     CORONARY ANGIOPLASTY  05/18/2005   2 STENTS distal RCA AND PROXIMAL-MID RCA   5 total stents per pt.   CYSTOSCOPY/URETEROSCOPY/HOLMIUM LASER/STENT PLACEMENT Left 01/11/2018   Procedure: LEFT URETEROSCOPY/HOLMIUM LASER/STENT PLACEMENT;  Surgeon: Ardis Hughs, MD;  Location: [facility];  Service: Urology;  Laterality: Left;   DOPPLER ECHOCARDIOGRAPHY  02/07/2012   EF 55%,SHOWED NO ISCHEMIA    HERNIA REPAIR     umbilical   lower arterial  doppler  02/04/2013   aotra 1.5 x 1.5 cm; distal abd aorta 70-99%,proximal common iliac arteries  -very stenotic with increased velocities>50%,may be falsely elevated as a result of residual plaque from the distal aorta stenosis   lower extremity doppler  June 18 ,2013   ABI'S ABNORMAL, RABI was 0.88 and LABI 0.75 ,with 3-vessel  run off   NM MYOVIEW LTD  MAY 23,2011   showed no significant ischemia;   NM MYOVIEW LTD  04/22/2008   ef 77%,exercise capcity 6 METS ,exaggerated blood pressure response to exercise   PERIPHERAL VASCULAR CATHETERIZATION N/A 04/24/2016   Procedure: Lower Extremity Angiography;  Surgeon: Lorretta Harp, MD;  Location: [facility];  Service: Cardiovascular;  Laterality: N/A;   PERIPHERAL VASCULAR CATHETERIZATION  04/24/2016   Procedure: Peripheral Vascular Intervention;  Surgeon: Lorretta Harp, MD;  Location: [facility];  Service: Cardiovascular;;  Aorta   retrograde central aortic catheterization  05/19/2005   cutting balloon atherectomy, c-circ stenosis with DES STENTING CYPHER   TONSILLECTOMY     TOTAL HIP ARTHROPLASTY Right 07/11/2022   Procedure: TOTAL HIP ARTHROPLASTY ANTERIOR APPROACH;  Surgeon: Paralee Cancel, MD;  [facility];  Service: Orthopedics;  Laterality: Right;   TOTAL KNEE ARTHROPLASTY Left 01/03/2019   Procedure: TOTAL KNEE ARTHROPLASTY;  Surgeon: Netta Cedars, MD;  Location[facility];  Service: Orthopedics;  Laterality: Left;  with IS block   TRANSURETHRAL RESECTION OF PROSTATE N/A 09/08/2016   Procedure: TRANSURETHRAL RESECTION OF THE PROSTATE (TURP);  Surgeon: Ardis Hughs, MD;  [facility];  Service: Urology;  Laterality: N/A;   TRANSURETHRAL RESECTION OF PROSTATE     10-10-17  Dr. Louis Meckel   TRANSURETHRAL RESECTION OF PROSTATE N/A 10/10/2017   Procedure: TRANSURETHRAL RESECTION OF THE PROSTATE (TURP);  Surgeon: Ardis Hughs, MD;  [facility];  Service: Urology;  Laterality: N/A;   VASECTOMY      reports that he quit smoking about 54 years ago. His smoking use included cigarettes. He has a 40.00 pack-year  smoking history. He has never used smokeless tobacco. He reports that he does not currently use alcohol after a past usage of about 4.0 standard drinks of alcohol per week. He reports that he does not use drugs. Social History   Socioeconomic History   Marital status: Married    Spouse name: [name]   Number of children: 2   Years of education: BA   Highest education level: Not on file  Occupational History   Occupation: Retired  Tobacco Use   Smoking status: Former    Packs/day: 2.00    Years: 20.00    Additional pack years: 0.00    Total pack years: 40.00    Types: Cigarettes    Quit date: 07/31/1968    Years since quitting: 54.2   Smokeless tobacco: Never  Vaping Use   Vaping Use: Never used  Substance and Sexual Activity   Alcohol use: Not Currently    Alcohol/week: 4.0 standard drinks of alcohol  Types: 4 Glasses of wine per week   Drug use: Never   Sexual activity: Not Currently  Other Topics Concern   Not on file  Social History Narrative   Lives with wife   Caffeine use: Decaf coffee, tea   Left handed   Social Determinants of Health   Financial Resource Strain: Not on file  Food Insecurity: No Food Insecurity (07/09/2022)   Hunger Vital Sign    Worried About Running Out of Food in the Last Year: Never true    Ran Out of Food in the Last Year: Never true  Transportation Needs: No Transportation Needs (07/09/2022)   PRAPARE - Hydrologist (Medical): No    Lack of Transportation (Non-Medical): No  Physical Activity: Not on file  Stress: Not on file  Social Connections: Not on file  Intimate Partner Violence: Not At Risk (07/09/2022)   Humiliation, Afraid, Rape, and Kick questionnaire    Fear of Current or Ex-Partner: No    Emotionally Abused: No    Physically Abused: No    Sexually Abused: No    Functional Status Survey:    Family History  Problem Relation Age of Onset   Ovarian cancer Sister    Heart disease Father     Brain cancer Brother        Tumor    Health Maintenance  Topic Date Due   Zoster Vaccines- Shingrix (1 of 2) Never done   Medicare Annual Wellness (AWV)  09/18/2018   COVID-19 Vaccine (9 - 2023-24 season) 07/26/2022   Pneumonia Vaccine 55+ Years old (2 of 2 - PCV) 08/19/2023 (Originally 09/25/2019)   DTaP/Tdap/Td (2 - Tdap) 09/09/2023   INFLUENZA VACCINE  Completed   HPV VACCINES  Aged Out    Allergies  Allergen Reactions   Lisinopril Cough   Niaspan [Niacin Er] Rash    Outpatient Encounter Medications as of 10/16/2022  Medication Sig   acetaminophen (TYLENOL) 500 MG tablet Take 1 tablet (500 mg total) by mouth 2 (two) times daily as needed.   amLODipine (NORVASC) 10 MG tablet Take 10 mg by mouth daily.   Apoaequorin (PREVAGEN PO) Take 1 tablet by mouth daily.   atenolol (TENORMIN) 50 MG tablet Take 50 mg by mouth daily.   atorvastatin (LIPITOR) 40 MG tablet Take 40 mg by mouth daily at 6 PM.    Biotin 10 MG CAPS Take 10 mg by mouth daily.   Camphor-Menthol-Methyl Sal (SALONPAS) 3.08-05-08 % PTCH Apply 1 patch topically as needed. Special Instructions: Salonpas patch (on in am, off in pm) as needed to affected area   cholecalciferol (VITAMIN D3) 25 MCG (1000 UT) tablet Take 1,000 Units by mouth daily.   clopidogrel (PLAVIX) 75 MG tablet Take 75 mg by mouth every evening.    finasteride (PROSCAR) 5 MG tablet Take 5 mg by mouth daily.   gabapentin (NEURONTIN) 100 MG capsule Take 100 mg by mouth 3 (three) times daily.   Glucosamine-Chondroitin 750-600 MG TABS Take 1 tablet by mouth 2 (two) times daily.   hydrALAZINE (APRESOLINE) 25 MG tablet Take 25 mg by mouth every 8 (eight) hours as needed. See other listing   hydrALAZINE (APRESOLINE) 50 MG tablet Take 1 tablet (50 mg total) by mouth 4 (four) times daily.   hydrochlorothiazide (MICROZIDE) 12.5 MG capsule Take 2 capsules (25 mg total) by mouth daily.   HYDROcodone bit-homatropine (HYCODAN) 5-1.5 MG/5ML syrup Take 5 mLs by mouth  every 6 (six) hours as needed for  cough.   iron polysaccharides (NIFEREX) 150 MG capsule Take 150 mg by mouth every Monday, Wednesday, and Friday.   losartan (COZAAR) 50 MG tablet Take 50 mg by mouth 2 (two) times daily.   melatonin 5 MG TABS Take 1 tablet (5 mg total) by mouth at bedtime.   Misc Natural Products (GLUCOSAMINE CHONDROITIN TRIPLE) TABS Take 1 tablet by mouth 2 (two) times daily.   Multiple Vitamin (MULTIVITAMIN WITH MINERALS) TABS tablet Take 1 tablet by mouth daily.   pantoprazole (PROTONIX) 40 MG tablet Take 40 mg by mouth 2 (two) times daily.   polyethylene glycol (MIRALAX) 17 g packet Take 17 g by mouth daily as needed.   potassium chloride SA (K-DUR,KLOR-CON) 20 MEQ tablet Take 20 mEq by mouth every evening.    senna (SENOKOT) 8.6 MG TABS tablet Take 1 tablet (8.6 mg total) by mouth 2 (two) times daily.   tamsulosin (FLOMAX) 0.4 MG CAPS capsule Take 1 capsule (0.4 mg total) by mouth daily after supper.   No facility-administered encounter medications on file as of 10/16/2022.    Review of Systems  Constitutional:  Negative for activity change, appetite change and unexpected weight change.  HENT: Negative.    Respiratory:  Negative for cough and shortness of breath.   Cardiovascular:  Negative for leg swelling.  Gastrointestinal:  Positive for diarrhea. Negative for constipation.  Genitourinary:  Negative for frequency.  Musculoskeletal:  Positive for gait problem. Negative for arthralgias and myalgias.  Skin: Negative.  Negative for rash.  Neurological:  Negative for dizziness and weakness.  Psychiatric/Behavioral:  Positive for confusion. Negative for sleep disturbance.   All other systems reviewed and are negative.   Vitals:   10/16/22 1237  BP: (!) 167/60  Pulse: 75  Resp: 18  Temp: 97.7 F (36.5 C)   There is no height or weight on file to calculate BMI. Physical Exam Vitals reviewed.  Constitutional:      Appearance: Normal appearance.  HENT:      Head: Normocephalic.     Nose: Nose normal.     Mouth/Throat:     Mouth: Mucous membranes are moist.     Pharynx: Oropharynx is clear.  Eyes:     Pupils: Pupils are equal, round, and reactive to light.  Cardiovascular:     Rate and Rhythm: Normal rate and regular rhythm.     Pulses: Normal pulses.     Heart sounds: No murmur heard. Pulmonary:     Effort: Pulmonary effort is normal. No respiratory distress.     Breath sounds: Normal breath sounds.  Abdominal:     General: Abdomen is flat. Bowel sounds are normal.     Palpations: Abdomen is soft.  Musculoskeletal:        General: No swelling.     Cervical back: Neck supple.  Skin:    General: Skin is warm.  Neurological:     General: No focal deficit present.     Mental Status: He is alert.  Psychiatric:        Mood and Affect: Mood normal.        Thought Content: Thought content normal.     Labs reviewed: Basic Metabolic Panel: Recent Labs    07/11/22 0334 07/12/22 0329 07/12/22 0330 07/13/22 0335 07/20/22 0000 07/27/22 0000  NA 142 143 143 145 141 140  K 3.8 4.6 4.6 4.0 4.2 3.9  CL 108 110 110 115* 107 103  CO2 26 25 24 25  23* 23*  GLUCOSE 129* 179*  181* 186*  --   --   BUN 16 26* 24* 33* 33* 30*  CREATININE 0.84 1.32* 1.34* 1.04 1.0 1.5*  CALCIUM 8.7* 8.7* 8.7* 8.8* 9.0 9.4  MG 2.1 2.4  --  2.2  --   --   PHOS 3.3  --  3.4 2.8  --   --    Liver Function Tests: Recent Labs    05/13/22 1510 05/13/22 2334 05/14/22 0452 07/11/22 0334 07/12/22 0330 07/13/22 0335  AST 22 17 19   --   --   --   ALT 21 22 21   --   --   --   ALKPHOS 70 66 64  --   --   --   BILITOT 0.7 0.9 0.8  --   --   --   PROT 6.7 6.3* 6.1*  --   --   --   ALBUMIN 3.5 3.3* 3.2* 3.3* 2.7* 2.9*   No results for input(s): "LIPASE", "AMYLASE" in the last 8760 hours. Recent Labs    05/13/22 2334  AMMONIA 18   CBC: Recent Labs    05/15/22 0613 05/16/22 0110 07/09/22 1450 07/11/22 0334 07/12/22 0329 07/13/22 0335 07/20/22 0000  07/27/22 0000  WBC 7.3 8.7 9.0 10.2 14.4* 16.0* 11.0 15.5  NEUTROABS 5.0 6.4 7.3  --   --   --   --   --   HGB 13.5 12.6* 12.6* 12.2* 10.5* 9.6* 10.4* 10.5*  HCT 40.6 38.4* 38.5* 37.6* 32.6* 30.0* 32* 33*  MCV 83.2 83.3 87.1 88.3 89.6 89.3  --   --   PLT 195 189 257 202 201 205 258 296   Cardiac Enzymes: Recent Labs    05/13/22 2334  CKTOTAL 50   BNP: Invalid input(s): "POCBNP" Lab Results  Component Value Date   HGBA1C 5.5 11/23/2016   Lab Results  Component Value Date   TSH 1.104 05/13/2022   Lab Results  Component Value Date   [id number] 05/13/2022   Lab Results  Component Value Date   FOLATE 32.5 05/13/2022   Lab Results  Component Value Date   IRON 63 05/13/2022   TIBC 323 05/13/2022   FERRITIN 23 (L) 05/13/2022    Imaging and Procedures obtained prior to SNF admission: VAS US CAROTID  Result Date: 08/28/2022 Carotid Arterial Duplex Study Patient Name:  PENN ALKINS  Date of Exam:   08/28/2022 Medical Rec #: [id number]          Accession #:    [id number] Date of Birth: 14-Feb-1931          Patient Gender: M Patient Age:   30 years Exam Location:  [facility] Procedure:      VAS US CAROTID Referring Phys: Roderic Palau BERRY --------------------------------------------------------------------------------  Indications:       Carotid artery disease and Patient denies any cerebrovascular                    symptoms today. He has history of right carotid                    endarterectomy 11/15/2010. Risk Factors:      Hypertension, hyperlipidemia, past history of smoking,                    coronary artery disease, PAD. Comparison Study:  Prior carotid duplex exam on 08/19/2021 showed highest  velocities in right mid ICA 76/15 cm/s and left proximal ICA                    291/50 cm/s. Performing Technologist: Salvadore Dom RVT, RDCS (AE), RDMS  Examination Guidelines: A complete evaluation includes B-mode imaging, spectral Doppler, color Doppler, and power  Doppler as needed of all accessible portions of each vessel. Bilateral testing is considered an integral part of a complete examination. Limited examinations for reoccurring indications may be performed as noted.  Right Carotid Findings: +----------+--------+--------+--------+-------------------+--------------------+           PSV cm/sEDV cm/sStenosisPlaque Description Comments             +----------+--------+--------+--------+-------------------+--------------------+ CCA Prox  161     18              calcific, smooth   turbulent flow                                         and diffuse                             +----------+--------+--------+--------+-------------------+--------------------+ CCA Distal62      18                                 S/P CEA              +----------+--------+--------+--------+-------------------+--------------------+ ICA Prox  90      16                                 S/P CEA              +----------+--------+--------+--------+-------------------+--------------------+ ICA Mid   150     26      1-39%                      based on peak                                                             systolic flow        +----------+--------+--------+--------+-------------------+--------------------+ ICA Distal134     26                                                      +----------+--------+--------+--------+-------------------+--------------------+ ECA       234     11                                                      +----------+--------+--------+--------+-------------------+--------------------+ +----------+--------+-------+---------+-------------------+           PSV cm/sEDV cmsDescribe Arm Pressure (mmHG) +----------+--------+-------+---------+-------------------+ [id number]  Turbulent150                 +----------+--------+-------+---------+-------------------+  +---------+--------+--+--------+-+---------------------------+ VertebralPSV cm/s42EDV cm/s1High resistant and Atypical +---------+--------+--+--------+-+---------------------------+  Left Carotid Findings: +----------+--------+--------+--------+------------------+---------------------+           PSV cm/sEDV cm/sStenosisPlaque DescriptionComments              +----------+--------+--------+--------+------------------+---------------------+ CCA Prox  37      7               diffuse,                                                                  heterogenous and                                                          calcific                                +----------+--------+--------+--------+------------------+---------------------+ CCA Mid   47      14      <50%    diffuse, calcific                                                         and irregular                           +----------+--------+--------+--------+------------------+---------------------+ CCA Distal41      8               diffuse, calcific                                                         and irregular                           +----------+--------+--------+--------+------------------+---------------------+ ICA Prox  458     59      40-59%  diffuse, irregularshadowing; high end                                     and calcific      range stenosis        +----------+--------+--------+--------+------------------+---------------------+ ICA Mid   92      14                                                      +----------+--------+--------+--------+------------------+---------------------+ ICA Distal59  13                                                      +----------+--------+--------+--------+------------------+---------------------+ ECA       113     10                                                       +----------+--------+--------+--------+------------------+---------------------+ +----------+--------+--------+---------+-------------------+           PSV cm/sEDV cm/sDescribe Arm Pressure (mmHG) +----------+--------+--------+---------+-------------------+ Subclavian213             Turbulent160                 +----------+--------+--------+---------+-------------------+ +---------+--------+---+--------+--+---------+ VertebralPSV cm/s156EDV cm/s22Antegrade +---------+--------+---+--------+--+---------+ Due to shadowing plaque can not rule out higher grade stenosis versus occlusion within.  Summary: Right Carotid: Velocities in the right ICA are consistent with a 1-39% stenosis.                S/P CEA Left Carotid: Velocities in the left ICA are consistent with a high end range               40-59% stenosis. ICA velocities have increased since prior exam.               See note above. Vertebrals:  Left vertebral artery demonstrates antegrade flow. Right vertebral              artery demonstrates high resistant flow. Subclavians: Bilateral subclavian artery flow was disturbed. *See table(s) above for measurements and observations. Suggest follow up study in 12 months. Electronically signed by Carlyle Dolly MD on 08/28/2022 at 4:16:21 PM.    Final     Assessment/Plan 1. Loose stools Will make his senokot and Miralax prn If he still continues then consider Stool studies  2. Gait abnormality Walking with his walker and Power chair Needs Mild assistant  3. Essential hypertension Very hard to control On Number of Meds Will also increase Tenormin to 75 mg  4. Peptic ulcer On Protonix  5. Iron deficiency anemia, unspecified iron deficiency anemia type HGB stable on Iron  6. Mixed hyperlipidemia Statin Repeat Lipids  7. MCI (mild cognitive impairment) Doing well in SNF  8. Stage 3a chronic kidney disease ([place]) Repeat Labs  9. Neuropathy Gabapentin    Family/ staff  Communication:   Labs/tests ordered: CBC,CMP,Lipid

## 2022-10-25 ENCOUNTER — Ambulatory Visit: Payer: Medicare Other | Admitting: Podiatry

## 2022-10-25 ENCOUNTER — Ambulatory Visit (INDEPENDENT_AMBULATORY_CARE_PROVIDER_SITE_OTHER): Payer: Medicare Other | Admitting: Podiatry

## 2022-10-25 ENCOUNTER — Encounter: Payer: Self-pay | Admitting: Podiatry

## 2022-10-25 DIAGNOSIS — M722 Plantar fascial fibromatosis: Secondary | ICD-10-CM

## 2022-10-25 MED ORDER — TRIAMCINOLONE ACETONIDE 10 MG/ML IJ SUSP
20.0000 mg | Freq: Once | INTRAMUSCULAR | Status: AC
  Administered 2022-10-25: 20 mg

## 2022-10-25 NOTE — Progress Notes (Signed)
Subjective:   Patient ID: Nathan Santiago, male   DOB: 89 y.o.   MRN: [id number]   HPI Patient presents stating both his heels are starting to get sore they did great for around 5 months   ROS      Objective:  Physical Exam  Neurovascular status intact chronic discomfort plantar fascia of both feet currently     Assessment:  Acute Planter fasciitis bilateral     Plan:  Reviewed condition sterile prep injected the plantar fascia bilateral 3 mg Kenalog 5 mg Xylocaine advised on support reappoint to recheck as needed

## 2022-11-09 ENCOUNTER — Other Ambulatory Visit: Payer: Self-pay | Admitting: Adult Health

## 2022-11-09 MED ORDER — HYDROCODONE BIT-HOMATROP MBR 5-1.5 MG/5ML PO SOLN: 5.0000 mL | Freq: Four times a day (QID) | ORAL | 0 refills | Status: DC | PRN

## 2022-11-16 ENCOUNTER — Non-Acute Institutional Stay (SKILLED_NURSING_FACILITY): Payer: Medicare Other | Admitting: Adult Health

## 2022-11-16 ENCOUNTER — Encounter: Payer: Self-pay | Admitting: Adult Health

## 2022-11-16 DIAGNOSIS — I10 Essential (primary) hypertension: Secondary | ICD-10-CM

## 2022-11-16 DIAGNOSIS — E7800 Pure hypercholesterolemia, unspecified: Secondary | ICD-10-CM | POA: Diagnosis not present

## 2022-11-16 DIAGNOSIS — F5101 Primary insomnia: Secondary | ICD-10-CM

## 2022-11-16 DIAGNOSIS — G629 Polyneuropathy, unspecified: Secondary | ICD-10-CM

## 2022-11-16 DIAGNOSIS — R413 Other amnesia: Secondary | ICD-10-CM | POA: Diagnosis not present

## 2022-11-16 DIAGNOSIS — I739 Peripheral vascular disease, unspecified: Secondary | ICD-10-CM

## 2022-11-16 DIAGNOSIS — R053 Chronic cough: Secondary | ICD-10-CM

## 2022-11-16 MED ORDER — CETIRIZINE HCL 10 MG PO TABS: 10.0000 mg | ORAL_TABLET | Freq: Every day | ORAL | 11 refills | Status: DC

## 2022-11-16 NOTE — Progress Notes (Signed)
Location: [facility].   Provider:  Peggye Ley, ANP [facility] [phone]  Code Status: DNR Goals of Care:     10/12/2022    2:34 PM  Advanced Directives  Does Patient Have a Medical Advance Directive? Yes  Type of Estate agent of [place];Living will  Does patient want to make changes to medical advance directive? No - Patient declined  Copy of Healthcare Power of Attorney in Chart? Yes - validated most recent copy scanned in chart (See row information)     Chief Complaint  Patient presents with   Medical Management of Chronic Issues    HPI: Patient is a 89 y.o. male seen today for medical management of chronic diseases.    PMH of CAD, PAD followed with Dr Allyson Sabal IDA, GERD, HTN, PVD and HLD He had a mechanical fall and subsequent severe right hip pain and inability to bear weight, and impacted subcapital femoral neck fracture. He underwent right THA by Dr. Charlann Boxer on 07/11/2022.  He moved to skilled care due to increased care needs and falls. [place] previously [place] wife as his care taker. He walks with a walker short distances and uses a scooter for long distances. Now is changing to a PWC.  Needs assistance with bathing, dressing. Able to feed himself. Very pleasant. Incontinent  He has a hx of urinary retention but reports being able to urinate ok for now. Hx of hydronephrosis and stent.   Has lumbar stenosis and hx of leg weakness with peripheral neuropathy Recently had injection for plantar fascitis.    Difficult to control BP  running 150-170/50-60s. No symptoms. No edema.   MMSE 27/30 09/2022  Chronic cough on hycodan, hx of cough due to gerd. No lung disease noted per note by pulmonary 2019. ON PPI.  Reports some difficulty sleeping but has improved over the past month. Using melatonin. Also taking hydrocodone at night for cough   Hx of AAA ICA stenosis and PAD, followed by Dr Allyson Sabal. Last carotid US showing bilateral  ICA stenosis with progression. Repeat ordered 12 months.  Renal artery stenosis left 90%. Also has femoral artery stenosis.  Currently denies claudication Past Medical History:  Diagnosis Date   AAA (abdominal aortic aneurysm)    asymptomatic   AAA (abdominal aortic aneurysm) 2010   peripheral  angiogram-- bilateral SFA DISEASE  and 60 to 70% infrarenaal abd. aortic stenosis with 15 -mm gradient   Adenomatous colon polyp 11/2003   CAD (coronary artery disease)    Gait abnormality 02/13/2017   Gastroparesis    pt denies   GERD (gastroesophageal reflux disease)    w/ LPR   History of kidney stones    x2   Hyperlipidemia    Hypertension    IDA (iron deficiency anemia)    Peripheral arterial disease    RCEA  by Dr [name]   PUD (peptic ulcer disease)    pt unaware   Vertigo     Past Surgical History:  Procedure Laterality Date   ABDOMINAL AORTOGRAM W/LOWER EXTREMITY Bilateral 08/05/2018   Procedure: ABDOMINAL AORTOGRAM W/LOWER EXTREMITY;  Surgeon: Runell Gess, MD;  Location: [facility] INVASIVE CV LAB;  Service: Cardiovascular;  Laterality: Bilateral;   ABDOMINAL AORTOGRAM W/LOWER EXTREMITY N/A 09/26/2021   Procedure: ABDOMINAL AORTOGRAM W/LOWER EXTREMITY;  Surgeon: Runell Gess, MD;  Location: [facility] INVASIVE CV LAB;  Service: Cardiovascular;  Laterality: N/A;   AORTOGRAM  04/24/2016    Abdominal aortogram, bilateral iliac angiogram,  bifemoral runoff   CARDIAC CATHETERIZATION  05/12/2005   RCA   carotid doppler  01/24/2013   RICA endarterectomy,left CCA 0-49%; left bulb and prox ICA 50-69%; bilaateral subclavian < 50%   CAROTID ENDARTERECTOMY  11/01/2010   CATARACT EXTRACTION     COLONOSCOPY     CORONARY ANGIOPLASTY  05/18/2005   2 STENTS distal RCA AND PROXIMAL-MID RCA   5 total stents per pt.   CYSTOSCOPY/URETEROSCOPY/HOLMIUM LASER/STENT PLACEMENT Left 01/11/2018   Procedure: LEFT URETEROSCOPY/HOLMIUM LASER/STENT PLACEMENT;  Surgeon: Crist Fat, MD;  Location: [facility]  ORS;  Service: Urology;  Laterality: Left;   DOPPLER ECHOCARDIOGRAPHY  02/07/2012   EF 55%,SHOWED NO ISCHEMIA    HERNIA REPAIR     umbilical   lower arterial  doppler  02/04/2013   aotra 1.5 x 1.5 cm; distal abd aorta 70-99%,proximal common iliac arteries -very stenotic with increased velocities>50%,may be falsely elevated as a result of residual plaque from the distal aorta stenosis   lower extremity doppler  June 18 ,2013   ABI'S ABNORMAL, RABI was 0.88 and LABI 0.75 ,with 3-vessel  run off   NM MYOVIEW LTD  MAY 23,2011   showed no significant ischemia;   NM MYOVIEW LTD  04/22/2008   ef 77%,exercise capcity 6 METS ,exaggerated blood pressure response to exercise   PERIPHERAL VASCULAR CATHETERIZATION N/A 04/24/2016   Procedure: Lower Extremity Angiography;  Surgeon: Runell Gess, MD;  Location: [facility] INVASIVE CV LAB;  Service: Cardiovascular;  Laterality: N/A;   PERIPHERAL VASCULAR CATHETERIZATION  04/24/2016   Procedure: Peripheral Vascular Intervention;  Surgeon: Runell Gess, MD;  Location: [facility] INVASIVE CV LAB;  Service: Cardiovascular;;  Aorta   retrograde central aortic catheterization  05/19/2005   cutting balloon atherectomy, c-circ stenosis with DES STENTING CYPHER   TONSILLECTOMY     TOTAL HIP ARTHROPLASTY Right 07/11/2022   Procedure: TOTAL HIP ARTHROPLASTY ANTERIOR APPROACH;  Surgeon: Durene Romans, MD;  Location: [facility] ORS;  Service: Orthopedics;  Laterality: Right;   TOTAL KNEE ARTHROPLASTY Left 01/03/2019   Procedure: TOTAL KNEE ARTHROPLASTY;  Surgeon: Beverely Low, MD;  Location: [facility] ORS;  Service: Orthopedics;  Laterality: Left;  with IS block   TRANSURETHRAL RESECTION OF PROSTATE N/A 09/08/2016   Procedure: TRANSURETHRAL RESECTION OF THE PROSTATE (TURP);  Surgeon: Crist Fat, MD;  Location: [facility] ORS;  Service: Urology;  Laterality: N/A;   TRANSURETHRAL RESECTION OF PROSTATE     10-10-17  Dr. Marlou Porch   TRANSURETHRAL RESECTION OF PROSTATE N/A 10/10/2017   Procedure:  TRANSURETHRAL RESECTION OF THE PROSTATE (TURP);  Surgeon: Crist Fat, MD;  Location: [facility] OR[facility];  Service: Urology;  Laterality: N/A;   VASECTOMY      Allergies  Allergen Reactions   Lisinopril Cough   Niaspan [Niacin Er] Rash    Outpatient Encounter Medications as of 11/16/2022  Medication Sig   cetirizine (ZYRTEC) 10 MG tablet Take 1 tablet (10 mg total) by mouth at bedtime.   acetaminophen (TYLENOL) 500 MG tablet Take 1 tablet (500 mg total) by mouth 2 (two) times daily as needed.   amLODipine (NORVASC) 10 MG tablet Take 10 mg by mouth daily.   Apoaequorin (PREVAGEN PO) Take 1 tablet by mouth daily.   atenolol (TENORMIN) 50 MG tablet Take 75 mg by mouth daily.   atorvastatin (LIPITOR) 40 MG tablet Take 40 mg by mouth daily at 6 PM.    Biotin 10 MG CAPS Take 10 mg by mouth daily.   Camphor-Menthol-Methyl Sal (SALONPAS) 3.08-05-08 % PTCH Apply 1  patch topically as needed. Special Instructions: Salonpas patch (on in am, off in pm) as needed to affected area   cholecalciferol (VITAMIN D3) 25 MCG (1000 UT) tablet Take 1,000 Units by mouth daily.   clopidogrel (PLAVIX) 75 MG tablet Take 75 mg by mouth every evening.    finasteride (PROSCAR) 5 MG tablet Take 5 mg by mouth daily.   gabapentin (NEURONTIN) 100 MG capsule Take 100 mg by mouth 3 (three) times daily.   Glucosamine-Chondroitin 750-600 MG TABS Take 1 tablet by mouth 2 (two) times daily.   hydrALAZINE (APRESOLINE) 25 MG tablet Take 25 mg by mouth every 8 (eight) hours as needed. See other listing   hydrALAZINE (APRESOLINE) 50 MG tablet Take 1 tablet (50 mg total) by mouth 4 (four) times daily.   hydrochlorothiazide (MICROZIDE) 12.5 MG capsule Take 2 capsules (25 mg total) by mouth daily.   HYDROcodone bit-homatropine (HYCODAN) 5-1.5 MG/5ML syrup Take 5 mLs by mouth every 6 (six) hours as needed for cough.   iron polysaccharides (NIFEREX) 150 MG capsule Take 150 mg by mouth every Monday, Wednesday, and Friday.   losartan (COZAAR)  50 MG tablet Take 50 mg by mouth 2 (two) times daily.   melatonin 5 MG TABS Take 1 tablet (5 mg total) by mouth at bedtime.   Misc Natural Products (GLUCOSAMINE CHONDROITIN TRIPLE) TABS Take 1 tablet by mouth 2 (two) times daily.   Multiple Vitamin (MULTIVITAMIN WITH MINERALS) TABS tablet Take 1 tablet by mouth daily.   pantoprazole (PROTONIX) 40 MG tablet Take 40 mg by mouth 2 (two) times daily.   polyethylene glycol (MIRALAX) 17 g packet Take 17 g by mouth daily as needed.   potassium chloride SA (K-DUR,KLOR-CON) 20 MEQ tablet Take 20 mEq by mouth every evening.    senna (SENOKOT) 8.6 MG TABS tablet Take 1 tablet (8.6 mg total) by mouth 2 (two) times daily.   tamsulosin (FLOMAX) 0.4 MG CAPS capsule Take 1 capsule (0.4 mg total) by mouth daily after supper.   No facility-administered encounter medications on file as of 11/16/2022.    Review of Systems:  Review of Systems  Constitutional:  Negative for activity change, appetite change, chills, diaphoresis, fatigue, fever and unexpected weight change.  Respiratory:  Positive for cough. Negative for shortness of breath, wheezing and stridor.   Cardiovascular:  Negative for chest pain, palpitations and leg swelling.  Gastrointestinal:  Negative for abdominal distention, abdominal pain, constipation and diarrhea.  Genitourinary:  Negative for difficulty urinating and dysuria.  Musculoskeletal:  Positive for gait problem. Negative for arthralgias, back pain, joint swelling and myalgias.       Leg pain, foot pain  Neurological:  Negative for dizziness, seizures, syncope, facial asymmetry, speech difficulty, weakness and headaches.  Hematological:  Negative for adenopathy. Does not bruise/bleed easily.  Psychiatric/Behavioral:  Negative for agitation, behavioral problems and confusion.     Health Maintenance  Topic Date Due   Zoster Vaccines- Shingrix (1 of 2) Never done   Medicare Annual Wellness (AWV)  09/18/2018   COVID-19 Vaccine (9 -  2023-24 season) 07/26/2022   Pneumonia Vaccine 19+ Years old (2 of 2 - PCV) 08/19/2023 (Originally 09/25/2019)   INFLUENZA VACCINE  03/01/2023   DTaP/Tdap/Td (2 - Tdap) 09/09/2023   HPV VACCINES  Aged Out    Physical Exam: Vitals:   11/16/22 1258  BP: (!) 161/67  Pulse: 62  Resp: 20  Temp: 97.9 F (36.6 C)  SpO2: 94%  Weight: 168 lb 6.4 oz (76.4 kg)  Body mass index is 28.91 kg/m. Physical Exam Vitals and nursing note reviewed.  Constitutional:      General: He is not in acute distress.    Appearance: He is not diaphoretic.  HENT:     Head: Normocephalic and atraumatic.     Right Ear: Tympanic membrane normal.     Left Ear: Tympanic membrane normal.     Nose: Nose normal.     Mouth/Throat:     Mouth: Mucous membranes are moist.     Pharynx: Oropharynx is clear.  Eyes:     Conjunctiva/sclera: Conjunctivae normal.     Pupils: Pupils are equal, round, and reactive to light.  Neck:     Thyroid: No thyromegaly.     Vascular: No JVD.     Trachea: No tracheal deviation.  Cardiovascular:     Rate and Rhythm: Normal rate and regular rhythm.     Heart sounds: No murmur heard. Pulmonary:     Effort: Pulmonary effort is normal. No respiratory distress.     Breath sounds: Normal breath sounds. No wheezing.  Abdominal:     General: Bowel sounds are normal. There is no distension.     Palpations: Abdomen is soft.     Tenderness: There is no abdominal tenderness.  Musculoskeletal:        General: No swelling, tenderness, deformity or signs of injury.     Right lower leg: No edema.     Left lower leg: No edema.  Lymphadenopathy:     Cervical: No cervical adenopathy.  Skin:    General: Skin is warm and dry.  Neurological:     Mental Status: He is alert and oriented to person, place, and time.     Cranial Nerves: No cranial nerve deficit.     Labs reviewed: Basic Metabolic Panel: Recent Labs    05/13/22 2334 05/13/22 2341 07/11/22 0334 07/12/22 0329 07/12/22 0330  07/13/22 0335 07/20/22 0000 07/27/22 0000  NA  --    < > 142 143 143 145 141 140  K  --    < > 3.8 4.6 4.6 4.0 4.2 3.9  CL  --    < > 108 110 110 115* 107 103  CO2  --    < > 26 25 24 25  23* 23*  GLUCOSE  --    < > 129* 179* 181* 186*  --   --   BUN  --    < > 16 26* 24* 33* 33* 30*  CREATININE  --    < > 0.84 1.32* 1.34* 1.04 1.0 1.5*  CALCIUM  --    < > 8.7* 8.7* 8.7* 8.8* 9.0 9.4  MG  --    < > 2.1 2.4  --  2.2  --   --   PHOS 3.2   < > 3.3  --  3.4 2.8  --   --   TSH 1.104  --   --   --   --   --   --   --    < > = values in this interval not displayed.   Liver Function Tests: Recent Labs    05/13/22 1510 05/13/22 2334 05/14/22 0452 07/11/22 0334 07/12/22 0330 07/13/22 0335  AST 22 17 19   --   --   --   ALT 21 22 21   --   --   --   ALKPHOS 70 66 64  --   --   --   BILITOT 0.7  0.9 0.8  --   --   --   PROT 6.7 6.3* 6.1*  --   --   --   ALBUMIN 3.5 3.3* 3.2* 3.3* 2.7* 2.9*   No results for input(s): "LIPASE", "AMYLASE" in the last 8760 hours. Recent Labs    05/13/22 2334  AMMONIA 18   CBC: Recent Labs    05/15/22 0613 05/16/22 0110 07/09/22 1450 07/11/22 0334 07/12/22 0329 07/13/22 0335 07/20/22 0000 07/27/22 0000  WBC 7.3 8.7 9.0 10.2 14.4* 16.0* 11.0 15.5  NEUTROABS 5.0 6.4 7.3  --   --   --   --   --   HGB 13.5 12.6* 12.6* 12.2* 10.5* 9.6* 10.4* 10.5*  HCT 40.6 38.4* 38.5* 37.6* 32.6* 30.0* 32* 33*  MCV 83.2 83.3 87.1 88.3 89.6 89.3  --   --   PLT 195 189 257 202 201 205 258 296   Lipid Panel: No results for input(s): "CHOL", "HDL", "LDLCALC", "TRIG", "CHOLHDL", "LDLDIRECT" in the last 8760 hours. Lab Results  Component Value Date   HGBA1C 5.5 11/23/2016    Procedures since last visit: No results found.  Assessment/Plan  1. Essential hypertension BUN 21.3/Cr 1.04 10/17/22 Increase atenolol to 100 but overall he will likely need to change meds further.Needs f/u with Dr Allyson Sabal but at this point in his life with his dementia not sure if there is  anything that can be done.  90% left renal artery stenosis noted per Dr Hazle Coca noted on angiogram 09/26/21  2. Neuropathy Intermittent foot pain Also has hx of claudication, radiculopathy, now recently treated for plantar fascia issues Would not taper neurontin at this time.   3. HYPERCHOLESTEROLEMIA LDL 65 TC 133 10/17/22 On lipitor   4. Memory loss Progressive short term memory loss now in skilled care due to this with falls Very pleasant no behaviors Adjusting to skilled well Would likely not benefit from any changes.   5. Peripheral artery disease Followed by Dr Allyson Sabal Stenting in the past Intermittent claudication in the past but not now.  Noted to have common femoral artery stenoses On lipitor, plavix.   6. Primary insomnia On melatonin Improving as he adjusts to skilled care Continue melatonin Should not use cough syrup for sleep.  Could up neurontin if worsening.   7. Chronic cough Remote smoking hx No sputum production Saw Dr Delton Coombes in 2019. GERD was the pain factor. No obstructive lung disease.  Continue with dry cough using cough syrup Nurse to try guaifenesin syrup rather than hydrocodone.   Discussed with patient and wife at bedside. Went over [employer] for March need to be abstracted into epic.

## 2022-11-28 ENCOUNTER — Encounter: Payer: Self-pay | Admitting: Orthopedic Surgery

## 2022-11-28 ENCOUNTER — Non-Acute Institutional Stay (INDEPENDENT_AMBULATORY_CARE_PROVIDER_SITE_OTHER): Payer: Medicare Other | Admitting: Orthopedic Surgery

## 2022-11-28 DIAGNOSIS — Z0000 Encounter for general adult medical examination without abnormal findings: Secondary | ICD-10-CM

## 2022-11-28 NOTE — Progress Notes (Signed)
Subjective:   Nathan Santiago is a 89 y.o. male who presents for Medicare Annual/Subsequent preventive examination.  Place of Service: [facility] skilled nursing unit Provider: Hazle Nordmann, AGNP-C   Review of Systems     Cardiac Risk Factors include: advanced age (>15men, >45 women);hypertension;male gender;sedentary lifestyle     Objective:    Today's Vitals   11/28/22 1143  BP: (!) 154/70  Pulse: 62  Resp: 17  Temp: (!) 97.5 F (36.4 C)  SpO2: 94%  Weight: 168 lb 6.4 oz (76.4 kg)  Height: 5\' 4"  (1.626 m)   Body mass index is 28.91 kg/m.     11/28/2022   11:49 AM 10/12/2022    2:34 PM 07/28/2022    9:01 AM 07/17/2022    9:54 AM 07/14/2022   11:12 AM 07/14/2022   10:56 AM 07/09/2022    8:28 PM  Advanced Directives  Does Patient Have a Medical Advance Directive? Yes Yes Yes Yes Yes Yes Yes  Type of Estate agent of [place];Living will Healthcare Power of [place];Living will Healthcare Power of [place];Out of facility DNR (pink MOST or yellow form) Healthcare Power of [place];Out of facility DNR (pink MOST or yellow form) Healthcare Power of [place];Out of facility DNR (pink MOST or yellow form) Healthcare Power of [place];Living will Healthcare Power of [place];Living will  Does patient want to make changes to medical advance directive? No - Patient declined No - Patient declined No - Patient declined No - Patient declined No - Patient declined No - Patient declined No - Patient declined  Copy of Healthcare Power of Attorney in Chart? Yes - validated most recent copy scanned in chart (See row information) Yes - validated most recent copy scanned in chart (See row information) Yes - validated most recent copy scanned in chart (See row information) Yes - validated most recent copy scanned in chart (See row information) Yes - validated most recent copy scanned in chart (See row information) Yes - validated most recent copy scanned in chart (See row  information) No - copy requested    Current Medications (verified) Outpatient Encounter Medications as of 11/28/2022  Medication Sig   acetaminophen (TYLENOL) 500 MG tablet Take 1 tablet (500 mg total) by mouth 2 (two) times daily as needed.   amLODipine (NORVASC) 10 MG tablet Take 10 mg by mouth daily.   Apoaequorin (PREVAGEN PO) Take 1 tablet by mouth daily.   atenolol (TENORMIN) 50 MG tablet Take 100 mg by mouth daily.   atorvastatin (LIPITOR) 40 MG tablet Take 40 mg by mouth daily at 6 PM.    Biotin 10 MG CAPS Take 10 mg by mouth daily.   Camphor-Menthol-Methyl Sal (SALONPAS) 3.08-05-08 % PTCH Apply 1 patch topically as needed. Special Instructions: Salonpas patch (on in am, off in pm) as needed to affected area   cetirizine (ZYRTEC) 10 MG tablet Take 1 tablet (10 mg total) by mouth at bedtime.   cholecalciferol (VITAMIN D3) 25 MCG (1000 UT) tablet Take 1,000 Units by mouth daily.   clopidogrel (PLAVIX) 75 MG tablet Take 75 mg by mouth every evening.    finasteride (PROSCAR) 5 MG tablet Take 5 mg by mouth daily.   gabapentin (NEURONTIN) 100 MG capsule Take 100 mg by mouth 3 (three) times daily.   Glucosamine-Chondroitin 750-600 MG TABS Take 1 tablet by mouth 2 (two) times daily.   hydrALAZINE (APRESOLINE) 25 MG tablet Take 25 mg by mouth every 8 (eight) hours as needed. See other listing  hydrALAZINE (APRESOLINE) 50 MG tablet Take 1 tablet (50 mg total) by mouth 4 (four) times daily.   hydrochlorothiazide (MICROZIDE) 12.5 MG capsule Take 2 capsules (25 mg total) by mouth daily.   HYDROcodone bit-homatropine (HYCODAN) 5-1.5 MG/5ML syrup Take 5 mLs by mouth every 6 (six) hours as needed for cough.   iron polysaccharides (NIFEREX) 150 MG capsule Take 150 mg by mouth every Monday, Wednesday, and Friday.   losartan (COZAAR) 50 MG tablet Take 50 mg by mouth 2 (two) times daily.   melatonin 5 MG TABS Take 1 tablet (5 mg total) by mouth at bedtime.   Misc Natural Products (GLUCOSAMINE  CHONDROITIN TRIPLE) TABS Take 1 tablet by mouth 2 (two) times daily.   Multiple Vitamin (MULTIVITAMIN WITH MINERALS) TABS tablet Take 1 tablet by mouth daily.   pantoprazole (PROTONIX) 40 MG tablet Take 40 mg by mouth 2 (two) times daily.   polyethylene glycol (MIRALAX) 17 g packet Take 17 g by mouth daily as needed.   potassium chloride SA (K-DUR,KLOR-CON) 20 MEQ tablet Take 20 mEq by mouth every evening.    tamsulosin (FLOMAX) 0.4 MG CAPS capsule Take 1 capsule (0.4 mg total) by mouth daily after supper.   [DISCONTINUED] senna (SENOKOT) 8.6 MG TABS tablet Take 1 tablet (8.6 mg total) by mouth 2 (two) times daily.   No facility-administered encounter medications on file as of 11/28/2022.    Allergies (verified) Lisinopril and Niaspan [niacin er]   History: Past Medical History:  Diagnosis Date   AAA (abdominal aortic aneurysm) (HCC)    asymptomatic   AAA (abdominal aortic aneurysm) (HCC) 2010   peripheral  angiogram-- bilateral SFA DISEASE  and 60 to 70% infrarenaal abd. aortic stenosis with 15 -mm gradient   Adenomatous colon polyp 11/2003   CAD (coronary artery disease)    Gait abnormality 02/13/2017   Gastroparesis    pt denies   GERD (gastroesophageal reflux disease)    w/ LPR   History of kidney stones    x2   Hyperlipidemia    Hypertension    IDA (iron deficiency anemia)    Peripheral arterial disease (HCC)    RCEA  by Dr [name]   PUD (peptic ulcer disease)    pt unaware   Vertigo    Past Surgical History:  Procedure Laterality Date   ABDOMINAL AORTOGRAM W/LOWER EXTREMITY Bilateral 08/05/2018   Procedure: ABDOMINAL AORTOGRAM W/LOWER EXTREMITY;  Surgeon: Runell Gess, MD;  Location: [facility];  Service: Cardiovascular;  Laterality: Bilateral;   ABDOMINAL AORTOGRAM W/LOWER EXTREMITY N/A 09/26/2021   Procedure: ABDOMINAL AORTOGRAM W/LOWER EXTREMITY;  Surgeon: Runell Gess, MD;  Location: [facility];  Service: Cardiovascular;  Laterality:  N/A;   AORTOGRAM  04/24/2016    Abdominal aortogram, bilateral iliac angiogram, bifemoral runoff   CARDIAC CATHETERIZATION  05/12/2005   RCA   carotid doppler  01/24/2013   RICA endarterectomy,left CCA 0-49%; left bulb and prox ICA 50-69%; bilaateral subclavian < 50%   CAROTID ENDARTERECTOMY  11/01/2010   CATARACT EXTRACTION     COLONOSCOPY     CORONARY ANGIOPLASTY  05/18/2005   2 STENTS distal RCA AND PROXIMAL-MID RCA   5 total stents per pt.   CYSTOSCOPY/URETEROSCOPY/HOLMIUM LASER/STENT PLACEMENT Left 01/11/2018   Procedure: LEFT URETEROSCOPY/HOLMIUM LASER/STENT PLACEMENT;  Surgeon: Crist Fat, MD;  Location: [facility];  Service: Urology;  Laterality: Left;   DOPPLER ECHOCARDIOGRAPHY  02/07/2012   EF 55%,SHOWED NO ISCHEMIA    HERNIA REPAIR  umbilical   lower arterial  doppler  02/04/2013   aotra 1.5 x 1.5 cm; distal abd aorta 70-99%,proximal common iliac arteries -very stenotic with increased velocities>50%,may be falsely elevated as a result of residual plaque from the distal aorta stenosis   lower extremity doppler  June 18 ,2013   ABI'S ABNORMAL, RABI was 0.88 and LABI 0.75 ,with 3-vessel  run off   NM MYOVIEW LTD  MAY 23,2011   showed no significant ischemia;   NM MYOVIEW LTD  04/22/2008   ef 77%,exercise capcity 6 METS ,exaggerated blood pressure response to exercise   PERIPHERAL VASCULAR CATHETERIZATION N/A 04/24/2016   Procedure: Lower Extremity Angiography;  Surgeon: Runell Gess, MD;  [facility] INV[facility] CV [facility];  Service: Cardiovascular;  Laterality: N/A;   PERIPHERAL VASCULAR CATHETERIZATION  04/24/2016   Procedure: Peripheral Vascular Intervention;  Surgeon: Runell Gess, MD;  Location: [facility] INVASIVE CV LAB;  Service: Cardiovascular;;  Aorta   retrograde central aortic catheterization  05/19/2005   cutting balloon atherectomy, c-circ stenosis with DES STENTING CYPHER   TONSILLECTOMY     TOTAL HIP ARTHROPLASTY Right 07/11/2022   Procedure: TOTAL HIP ARTHROPLASTY  ANTERIOR APPROACH;  Surgeon: Durene Romans, MD;  Location: [facility];  Service: Orthopedics;  Laterality: Right;   TOTAL KNEE ARTHROPLASTY Left 01/03/2019   Procedure: TOTAL KNEE ARTHROPLASTY;  Surgeon: Beverely Low, MD;  Location: [facility];  Service: Orthopedics;  Laterality: Left;  with IS block   TRANSURETHRAL RESECTION OF PROSTATE N/A 09/08/2016   Procedure: TRANSURETHRAL RESECTION OF THE PROSTATE (TURP);  Surgeon: Crist Fat, MD;  Location: [facility];  Service: Urology;  Laterality: N/A;   TRANSURETHRAL RESECTION OF PROSTATE     10-10-17  Dr. Marlou Porch   TRANSURETHRAL RESECTION OF PROSTATE N/A 10/10/2017   Procedure: TRANSURETHRAL RESECTION OF THE PROSTATE (TURP);  Surgeon: Crist Fat, MD;  Location: [facility];  Service: Urology;  Laterality: N/A;   VASECTOMY     Family History  Problem Relation Age of Onset   Ovarian cancer Sister    Heart disease Father    Brain cancer Brother        Tumor   Social History   Socioeconomic History   Marital status: Married    Spouse name: Nelva Bush   Number of children: 2   Years of education: BA   Highest education level: Not on file  Occupational History   Occupation: Retired  Tobacco Use   Smoking status: Former    Packs/day: 2.00    Years: 20.00    Additional pack years: 0.00    Total pack years: 40.00    Types: Cigarettes    Quit date: 07/31/1968    Years since quitting: 54.3   Smokeless tobacco: Never  Vaping Use   Vaping Use: Never used  Substance and Sexual Activity   Alcohol use: Not Currently    Alcohol/week: 4.0 standard drinks of alcohol    Types: 4 Glasses of wine per week   Drug use: Never   Sexual activity: Not Currently  Other Topics Concern   Not on file  Social History Narrative   Lives with wife   Caffeine use: Decaf coffee, tea   Left handed   Social Determinants of Health   Financial Resource Strain: Low Risk  (11/28/2022)   Overall Financial Resource Strain (CARDIA)    Difficulty of Paying Living Expenses:  Not hard at all  Food Insecurity: No Food Insecurity (11/28/2022)   Hunger Vital Sign    Worried About  Running Out of Food in the Last Year: Never true    Ran Out of Food in the Last Year: Never true  Transportation Needs: No Transportation Needs (11/28/2022)   PRAPARE - Administrator, Civil Service (Medical): No    Lack of Transportation (Non-Medical): No  Physical Activity: Insufficiently Active (11/28/2022)   Exercise Vital Sign    Days of Exercise per Week: 3 days    Minutes of Exercise per Session: 30 min  Stress: No Stress Concern Present (11/28/2022)   [employer] of Occupational Health - Occupational Stress Questionnaire    Feeling of Stress : Not at all  Social Connections: Moderately Isolated (11/28/2022)   Social Connection and Isolation Panel [NHANES]    Frequency of Communication with Friends and Family: Three times a week    Frequency of Social Gatherings with Friends and Family: More than three times a week    Attends Religious Services: Never    Database administrator or Organizations: No    Attends Engineer, structural: Never    Marital Status: Married    Tobacco Counseling Counseling given: Not Answered   Clinical Intake:  Pre-visit preparation completed: No  Pain : No/denies pain     BMI - recorded: 28.91 Nutritional Status: BMI 25 -29 Overweight Nutritional Risks: None Diabetes: No  How often do you need to have someone help you when you read instructions, pamphlets, or other written materials from your doctor or pharmacy?: 3 - Sometimes What is the last grade level you completed in school?: [place]  Diabetic?No  Interpreter Needed?: No      Activities of Daily Living    11/28/2022    2:50 PM 07/09/2022    8:28 PM  In your present state of health, do you have any difficulty performing the following activities:  Hearing? 1 0  Vision? 0 0  Difficulty concentrating or making decisions? 0 0  Walking or climbing  stairs? 1 1  Dressing or bathing? 1 0  Doing errands, shopping? 1 0  Using the Toilet? Y   In the past six months, have you accidently leaked urine? Y   Do you have problems with loss of bowel control? Y   Managing your Medications? Y   Managing your Finances? Y   Housekeeping or managing your Housekeeping? Y     Patient Care Team: Mahlon Gammon, MD as PCP - General (Internal Medicine) Runell Gess, MD as PCP - Cardiology (Cardiology)  Indicate any recent Medical Services you may have received from other than [facility] providers in the past year (date may be approximate).     Assessment:   This is a routine wellness examination for [place].  Hearing/Vision screen No results found.  Dietary issues and exercise activities discussed: Current Exercise Habits: Structured exercise class, Type of exercise: walking, Time (Minutes): 30, Frequency (Times/Week): 3, Weekly Exercise (Minutes/Week): 90, Intensity: Mild, Exercise limited by: orthopedic condition(s)   Goals Addressed             This Visit's Progress    DIET - INCREASE WATER INTAKE   On track      Depression Screen    11/28/2022    2:50 PM  PHQ 2/9 Scores  PHQ - 2 Score 0    Fall Risk    11/28/2022    2:50 PM 10/14/2022    7:21 AM 11/12/2017    2:58 PM  Fall Risk   Falls in the past year? 1 1 No  Number falls in past yr: 0 1   Injury with Fall? 0 1   Risk for fall due to : History of fall(s);Impaired balance/gait;Impaired mobility  Impaired balance/gait  Follow up Falls evaluation completed;Education provided;Falls prevention discussed Falls evaluation completed     FALL RISK PREVENTION PERTAINING TO THE HOME:  Any stairs in or around the home? No  If so, are there any without handrails? No  Home free of loose throw rugs in walkways, pet beds, electrical cords, etc? Yes  Adequate lighting in your home to reduce risk of falls? Yes   ASSISTIVE DEVICES UTILIZED TO PREVENT FALLS:  Life alert? Yes  Use of a  cane, walker or w/c? Yes  Grab bars in the bathroom? Yes  Shower chair or bench in shower? Yes  Elevated toilet seat or a handicapped toilet? Yes   TIMED UP AND GO:  Was the test performed? No .  Length of time to ambulate 10 feet: N/A sec.   Gait slow and steady with assistive device  Cognitive Function:    11/28/2022    2:41 PM  MMSE - Mini Mental State Exam  Orientation to time 4  Orientation to Place 5  Registration 3  Attention/ Calculation 5  Recall 3  Language- name 2 objects 1  Language- repeat 1  Language- follow 3 step command 3  Language- read & follow direction 1  Write a sentence 1  Copy design 1  Total score 28        Immunizations Immunization History  Administered Date(s) Administered   Fluad Quad(high Dose 65+) 05/02/2022   Influenza, High Dose Seasonal PF 03/31/2017, 03/29/2018   Influenza,inj,Quad PF,6+ Mos 04/25/2016, 05/06/2018   Influenza,inj,quad, With Preservative 05/26/2019   Influenza-Unspecified 05/14/2001, 05/31/2001, 05/14/2002, 05/15/2002, 05/18/2003, 05/25/2004, 04/30/2005, 05/29/2006, 05/01/2007, 05/11/2008, 05/31/2009, 05/31/2010, 04/30/2013, 05/31/2014, 03/31/2021   Moderna Covid-19 Vaccine Bivalent Booster 3yrs & up 08/31/2021   Moderna SARS-COV2 Booster Vaccination 06/15/2020, 11/19/2020, 01/02/2022   Moderna Sars-Covid-2 Vaccination 08/12/2019, 09/10/2019, 05/31/2022   Pneumococcal Polysaccharide-23 05/02/2000, 09/24/2018   Rsv, Bivalent, Protein Subunit Rsvpref,pf Verdis Frederickson) 07/19/2022   Td 09/08/2013    TDAP status: Up to date  Flu Vaccine status: Up to date  Pneumococcal vaccine status: Up to date  Covid-19 vaccine status: Completed vaccines  Qualifies for Shingles Vaccine? Yes   Zostavax completed Yes   Shingrix Completed?: Yes  Screening Tests Health Maintenance  Topic Date Due   Zoster Vaccines- Shingrix (1 of 2) Never done   COVID-19 Vaccine (8 - 2023-24 season) 07/26/2022   Pneumonia Vaccine 66+ Years old (2  of 2 - PCV) 08/19/2023 (Originally 09/25/2019)   INFLUENZA VACCINE  03/01/2023   DTaP/Tdap/Td (2 - Tdap) 09/09/2023   Medicare Annual Wellness (AWV)  11/28/2023   HPV VACCINES  Aged Out    Health Maintenance  Health Maintenance Due  Topic Date Due   Zoster Vaccines- Shingrix (1 of 2) Never done   COVID-19 Vaccine (8 - 2023-24 season) 07/26/2022    Colorectal cancer screening: No longer required.   Lung Cancer Screening: (Low Dose CT Chest recommended if Age 36-80 years, 30 pack-year currently smoking OR have quit w/in 15years.) does not qualify.   Lung Cancer Screening Referral: No  Additional Screening:  Hepatitis C Screening: does not qualify; Completed   Vision Screening: Recommended annual ophthalmology exams for early detection of glaucoma and other disorders of the eye. Is the patient up to date with their annual eye exam?  Yes  Who is the provider or what  is the name of the office in which the patient attends annual eye exams? Dr. Francee [place] [place] If pt is not established with a provider, would they like to be referred to a provider to establish care? No .   Dental Screening: Recommended annual dental exams for proper oral hygiene  Community Resource Referral / Chronic Care Management: CRR required this visit?  No   CCM required this visit?  No      Plan:     I have personally reviewed and noted the following in the patient's chart:   Medical and social history Use of alcohol, tobacco or illicit drugs  Current medications and supplements including opioid prescriptions. Patient is currently taking opioid prescriptions. Information provided to patient regarding non-opioid alternatives. Patient advised to discuss non-opioid treatment plan with their provider. Functional ability and status Nutritional status Physical activity Advanced directives List of other physicians Hospitalizations, surgeries, and ER visits in previous 12 months Vitals Screenings to include  cognitive, depression, and falls Referrals and appointments  In addition, I have reviewed and discussed with patient certain preventive protocols, quality metrics, and best practice recommendations. A written personalized care plan for preventive services as well as general preventive health recommendations were provided to patient.     Octavia Heir, NP   11/28/2022   Nurse Notes: Shingles vaccine given by [facility]. Recent covid booster given 04/25.

## 2022-11-30 ENCOUNTER — Other Ambulatory Visit: Payer: Self-pay | Admitting: Adult Health

## 2022-11-30 MED ORDER — HYDROCODONE BIT-HOMATROP MBR 5-1.5 MG/5ML PO SOLN: 5.0000 mL | Freq: Four times a day (QID) | ORAL | 0 refills | Status: DC | PRN

## 2022-12-15 ENCOUNTER — Ambulatory Visit: Payer: Medicare Other | Attending: Physician Assistant | Admitting: Physician Assistant

## 2022-12-15 ENCOUNTER — Encounter: Payer: Self-pay | Admitting: Physician Assistant

## 2022-12-15 VITALS — BP 140/50 | HR 61 | Ht 64.0 in | Wt 171.0 lb

## 2022-12-15 DIAGNOSIS — I6523 Occlusion and stenosis of bilateral carotid arteries: Secondary | ICD-10-CM

## 2022-12-15 DIAGNOSIS — I2510 Atherosclerotic heart disease of native coronary artery without angina pectoris: Secondary | ICD-10-CM

## 2022-12-15 DIAGNOSIS — I10 Essential (primary) hypertension: Secondary | ICD-10-CM | POA: Diagnosis not present

## 2022-12-15 DIAGNOSIS — Z79899 Other long term (current) drug therapy: Secondary | ICD-10-CM | POA: Diagnosis not present

## 2022-12-15 DIAGNOSIS — I739 Peripheral vascular disease, unspecified: Secondary | ICD-10-CM

## 2022-12-15 DIAGNOSIS — E785 Hyperlipidemia, unspecified: Secondary | ICD-10-CM

## 2022-12-15 MED ORDER — IRBESARTAN 150 MG PO TABS: 150.0000 mg | ORAL_TABLET | Freq: Every day | ORAL | 3 refills | Status: DC

## 2022-12-15 NOTE — Progress Notes (Unsigned)
Cardiology Office Note:    Date:  12/17/2022   ID:  Nathan Santiago, DOB 1931/05/31, MRN [id number]  PCP:  Mahlon Gammon, MD   [facility] Providers Cardiologist:  Nanetta Batty, MD     Referring MD: Mahlon Gammon, MD   Chief Complaint  Patient presents with   Follow-up    Seen for Dr. Allyson Sabal    History of Present Illness:    Nathan Santiago is a 89 y.o. male with a hx of CAD, AAA, hypertension, hyperlipidemia, carotid artery disease and PAD.  He was a former patient of Dr. Kandis Cocking.  He previously underwent stenting of proximal, mid and distal RCA using overlapping stents as well as proximal AV groove circumflex was known occluded OM branch in 2006.  Myoview was nonischemic in 2010.  Echocardiogram in July 2013 showed normal EF.  He previously underwent right CEA by vascular surgery for carotid artery disease.  He underwent lower extremity angiography in September 2017 revealing 95% distal abdominal aortic stenosis with 60 mm pullback gradient, 95% calcified distal left common femoral artery stenosis, 80% segmental mid right SFA stenosis with two-vessel runoff.  Distal abdominal aorta was stented with a 10 x 29 mm balloon expandable stent.  His claudication symptom subsequently resolved.  He underwent outpatient peripheral angiography in January 2020 revealing patent distal abdominal aortic stent without gradient, otherwise unchanged anatomy with 95% calcified left common femoral artery stenosis and bilateral moderate SFA disease.  In 2022, he had a recurrent claudication symptom.  Doppler ultrasound showed patent distal aortic stent and iliac arteries with high-frequency signal in the distal left common femoral and the distal right SFA.  He underwent peripheral angiography in February 2023 revealing 90% left renal artery stenosis, patent distal abdominal aortic stent and high-grade calcified left greater than right common femoral artery stenosis and a focal bilateral mid  SFA disease.  He was referred to Dr. Edilia Bo for consideration of common femoral enterectomy.  Dr. Edilia Bo felt the patient was not a good candidate because of his age and comorbidities.  He was last seen by Dr. Allyson Sabal in May 2023 at which time he was doing well.  Most recent carotid Doppler obtained in January 2024 demonstrated 1 to 39% disease using right ICA, 40 to 59% disease in the left ICA, antegrade vertebral artery flow with high resistance flow in the right vertebral artery.  Since the last visit, patient was admitted in October 2023 due to AKI secondary to urinary retention.  Renal ultrasound showed mild right hydronephrosis.  He was treated with IV fluid and a Foley catheter.  Urology recommended outpatient voiding trial.  He was discharged with Foley.  Due to back issue, MRI of the lumbar spine was ordered which demonstrated non-acute compression fracture in the setting of hemangioma, L1 height loss.  He was referred to Dr. Johnsie Cancel of neurosurgery.  He returned back to the hospital in December 2023 after a mechanical fall that resulted in subcapital femoral neck fracture.  He was seen by Dr. Charlann Boxer.  Hospital course was complicated by AKI which resolved with IV hydration.  He eventually underwent right total hip surgery by Dr. Charlann Boxer on 07/11/2022.  Patient presents today for follow-up.  He denies any anginal symptom or lower extremity pain.  He currently resides in [facility].  His room is 100 yards away from the cafeteria.  He goes down to the cafeteria multiple times during the day without any exertional symptoms.  Overall, he  is doing well from the cardiac perspective.  His blood pressure however remain a problem.  Systolic blood pressure typically runs around 150 at [facility].  Even in the cardiology office today, systolic blood pressure remain around 140s.  Manual recheck of blood pressure by myself was 140/50.  He is on amlodipine, atenolol, hydralazine,  hydrochlorothiazide and losartan.  Atenolol was the recent increase to 100 mg daily by PCP, however he says the change did not affect his blood pressure.  I will change losartan 100 mg daily to irbesartan 150 mg daily.  If blood pressure remains uncontrolled in the future, may increase irbesartan to 300 mg daily.  He will need a basic metabolic panel in 3 to 4 weeks to reassess renal function the electrolyte.  He can follow-up in 6 months   Past Medical History:  Diagnosis Date   AAA (abdominal aortic aneurysm) (HCC)    asymptomatic   AAA (abdominal aortic aneurysm) (HCC) 2010   peripheral  angiogram-- bilateral SFA DISEASE  and 60 to 70% infrarenaal abd. aortic stenosis with 15 -mm gradient   Adenomatous colon polyp 11/2003   CAD (coronary artery disease)    Gait abnormality 02/13/2017   Gastroparesis    pt denies   GERD (gastroesophageal reflux disease)    w/ LPR   History of kidney stones    x2   Hyperlipidemia    Hypertension    IDA (iron deficiency anemia)    Peripheral arterial disease (HCC)    RCEA  by Dr [name]   PUD (peptic ulcer disease)    pt unaware   Vertigo     Past Surgical History:  Procedure Laterality Date   ABDOMINAL AORTOGRAM W/LOWER EXTREMITY Bilateral 08/05/2018   Procedure: ABDOMINAL AORTOGRAM W/LOWER EXTREMITY;  Surgeon: Runell Gess, MD;  Location: [facility]AS[facility] CV [facility];  Service: Cardiovascular;  Laterality: Bilateral;   ABDOMINAL AORTOGRAM W/LOWER EXTREMITY N/A 09/26/2021   Procedure: ABDOMINAL AORTOGRAM W/LOWER EXTREMITY;  Surgeon: Runell Gess, MD;  Location: [facility];  Service: Cardiovascular;  Laterality: N/A;   AORTOGRAM  04/24/2016    Abdominal aortogram, bilateral iliac angiogram, bifemoral runoff   CARDIAC CATHETERIZATION  05/12/2005   RCA   carotid doppler  01/24/2013   RICA endarterectomy,left CCA 0-49%; left bulb and prox ICA 50-69%; bilaateral subclavian < 50%   CAROTID ENDARTERECTOMY  11/01/2010   CATARACT EXTRACTION      COLONOSCOPY     CORONARY ANGIOPLASTY  05/18/2005   2 STENTS distal RCA AND PROXIMAL-MID RCA   5 total stents per pt.   CYSTOSCOPY/URETEROSCOPY/HOLMIUM LASER/STENT PLACEMENT Left 01/11/2018   Procedure: LEFT URETEROSCOPY/HOLMIUM LASER/STENT PLACEMENT;  Surgeon: Crist Fat, MD;  Location: [facility];  Service: Urology;  Laterality: Left;   DOPPLER ECHOCARDIOGRAPHY  02/07/2012   EF 55%,SHOWED NO ISCHEMIA    HERNIA REPAIR     umbilical   lower arterial  doppler  02/04/2013   aotra 1.5 x 1.5 cm; distal abd aorta 70-99%,proximal common iliac arteries -very stenotic with increased velocities>50%,may be falsely elevated as a result of residual plaque from the distal aorta stenosis   lower extremity doppler  June 18 ,2013   ABI'S ABNORMAL, RABI was 0.88 and LABI 0.75 ,with 3-vessel  run off   NM MYOVIEW LTD  MAY 23,2011   showed no significant ischemia;   NM MYOVIEW LTD  04/22/2008   ef 77%,exercise capcity 6 METS ,exaggerated blood pressure response to exercise   PERIPHERAL VASCULAR CATHETERIZATION N/A 04/24/2016   Procedure:  Lower Extremity Angiography;  Surgeon: Runell Gess, MD;  Location[facility] INVASIVE CV LAB;  Service: Cardiovascular;  Laterality: N/A;   PERIPHERAL VASCULAR CATHETERIZATION  04/24/2016   Procedure: Peripheral Vascular Intervention;  Surgeon: Runell Gess, MD;  Location: [facility] INVASIVE CV LAB;  Service: Cardiovascular;;  Aorta   retrograde central aortic catheterization  05/19/2005   cutting balloon atherectomy, c-circ stenosis with DES STENTING CYPHER   TONSILLECTOMY     TOTAL HIP ARTHROPLASTY Right 07/11/2022   Procedure: TOTAL HIP ARTHROPLASTY ANTERIOR APPROACH;  Surgeon: Durene Romans, MD;  Location: [facility];  Service: Orthopedics;  Laterality: Right;   TOTAL KNEE ARTHROPLASTY Left 01/03/2019   Procedure: TOTAL KNEE ARTHROPLASTY;  Surgeon: Beverely Low, MD;  Location: [facility];  Service: Orthopedics;  Laterality: Left;  with IS block   TRANSURETHRAL RESECTION OF  PROSTATE N/A 09/08/2016   Procedure: TRANSURETHRAL RESECTION OF THE PROSTATE (TURP);  Surgeon: Crist Fat, MD;  Location: [facility];  Service: Urology;  Laterality: N/A;   TRANSURETHRAL RESECTION OF PROSTATE     10-10-17  Dr. Marlou Porch   TRANSURETHRAL RESECTION OF PROSTATE N/A 10/10/2017   Procedure: TRANSURETHRAL RESECTION OF THE PROSTATE (TURP);  Surgeon: Crist Fat, MD;  Location: [facility];  Service: Urology;  Laterality: N/A;   VASECTOMY      Current Medications: Current Meds  Medication Sig   acetaminophen (TYLENOL) 500 MG tablet Take 1 tablet (500 mg total) by mouth 2 (two) times daily as needed.   amLODipine (NORVASC) 10 MG tablet Take 10 mg by mouth daily.   Apoaequorin (PREVAGEN PO) Take 1 tablet by mouth daily.   atenolol (TENORMIN) 100 MG tablet Take 100 mg by mouth daily.   atorvastatin (LIPITOR) 40 MG tablet Take 40 mg by mouth daily at 6 PM.    Biotin 10 MG CAPS Take 10 mg by mouth daily.   Camphor-Menthol-Methyl Sal (SALONPAS) 3.08-05-08 % PTCH Apply 1 patch topically as needed. Special Instructions: Salonpas patch (on in am, off in pm) as needed to affected area   cetirizine (ZYRTEC) 10 MG tablet Take 1 tablet (10 mg total) by mouth at bedtime.   cholecalciferol (VITAMIN D3) 25 MCG (1000 UT) tablet Take 1,000 Units by mouth daily.   clopidogrel (PLAVIX) 75 MG tablet Take 75 mg by mouth every evening.    finasteride (PROSCAR) 5 MG tablet Take 5 mg by mouth daily.   gabapentin (NEURONTIN) 100 MG capsule Take 100 mg by mouth 3 (three) times daily.   Glucosamine-Chondroitin 750-600 MG TABS Take 1 tablet by mouth 2 (two) times daily.   hydrALAZINE (APRESOLINE) 25 MG tablet Take 25 mg by mouth every 8 (eight) hours as needed. See other listing   hydrALAZINE (APRESOLINE) 50 MG tablet Take 1 tablet (50 mg total) by mouth 4 (four) times daily.   hydrochlorothiazide (MICROZIDE) 12.5 MG capsule Take 2 capsules (25 mg total) by mouth daily.   HYDROcodone bit-homatropine (HYCODAN)  5-1.5 MG/5ML syrup Take 5 mLs by mouth every 6 (six) hours as needed for cough.   irbesartan (AVAPRO) 150 MG tablet Take 1 tablet (150 mg total) by mouth daily.   iron polysaccharides (NIFEREX) 150 MG capsule Take 150 mg by mouth every Monday, Wednesday, and Friday.   melatonin 5 MG TABS Take 1 tablet (5 mg total) by mouth at bedtime.   Multiple Vitamin (MULTIVITAMIN WITH MINERALS) TABS tablet Take 1 tablet by mouth daily.   pantoprazole (PROTONIX) 40 MG tablet Take 40 mg by mouth 2 (two) times daily.  polyethylene glycol (MIRALAX) 17 g packet Take 17 g by mouth daily as needed.   potassium chloride SA (K-DUR,KLOR-CON) 20 MEQ tablet Take 20 mEq by mouth every evening.    tamsulosin (FLOMAX) 0.4 MG CAPS capsule Take 1 capsule (0.4 mg total) by mouth daily after supper.   [DISCONTINUED] losartan (COZAAR) 50 MG tablet Take 50 mg by mouth 2 (two) times daily.     Allergies:   Lisinopril and Niaspan [niacin er]   Social History   Socioeconomic History   Marital status: Married    Spouse name: Nelva Bush   Number of children: 2   Years of education: BA   Highest education level: Not on file  Occupational History   Occupation: Retired  Tobacco Use   Smoking status: Former    Packs/day: 2.00    Years: 20.00    Additional pack years: 0.00    Total pack years: 40.00    Types: Cigarettes    Quit date: 07/31/1968    Years since quitting: 54.4   Smokeless tobacco: Never  Vaping Use   Vaping Use: Never used  Substance and Sexual Activity   Alcohol use: Not Currently    Alcohol/week: 4.0 standard drinks of alcohol    Types: 4 Glasses of wine per week   Drug use: Never   Sexual activity: Not Currently  Other Topics Concern   Not on file  Social History Narrative   Lives with wife   Caffeine use: Decaf coffee, tea   Left handed   Social Determinants of Health   Financial Resource Strain: Low Risk  (11/28/2022)   Overall Financial Resource Strain (CARDIA)    Difficulty of Paying Living  Expenses: Not hard at all  Food Insecurity: No Food Insecurity (11/28/2022)   Hunger Vital Sign    Worried About Running Out of Food in the Last Year: Never true    Ran Out of Food in the Last Year: Never true  Transportation Needs: No Transportation Needs (11/28/2022)   PRAPARE - Administrator, Civil Service (Medical): No    Lack of Transportation (Non-Medical): No  Physical Activity: Insufficiently Active (11/28/2022)   Exercise Vital Sign    Days of Exercise per Week: 3 days    Minutes of Exercise per Session: 30 min  Stress: No Stress Concern Present (11/28/2022)   [employer] of Occupational Health - Occupational Stress Questionnaire    Feeling of Stress : Not at all  Social Connections: Moderately Isolated (11/28/2022)   Social Connection and Isolation Panel [NHANES]    Frequency of Communication with Friends and Family: Three times a week    Frequency of Social Gatherings with Friends and Family: More than three times a week    Attends Religious Services: Never    Database administrator or Organizations: No    Attends Engineer, structural: Never    Marital Status: Married     Family History: The patient's family history includes Brain cancer in his brother; Heart disease in his father; Ovarian cancer in his sister.  ROS:   Please see the history of present illness.     All other systems reviewed and are negative.  EKGs/Labs/Other Studies Reviewed:    The following studies were reviewed today:  Carotid US 08/28/2022 Summary:  Right Carotid: Velocities in the right ICA are consistent with a 1-39%  stenosis.                S/P CEA   Left Carotid:  Velocities in the left ICA are consistent with a high end  range               40-59% stenosis. ICA velocities have increased since prior  exam.               See note above.   Vertebrals:  Left vertebral artery demonstrates antegrade flow. Right  vertebral              artery demonstrates high  resistant flow.  Subclavians: Bilateral subclavian artery flow was disturbed.   EKG:  EKG is not ordered today.    Recent Labs: 05/13/2022: TSH 1.104 07/13/2022: Magnesium 2.2 10/17/2022: ALT 15; BUN 21; Creatinine 1.0; Hemoglobin 12.6; Platelets 217; Potassium 4.0; Sodium 142  Recent Lipid Panel    Component Value Date/Time   CHOL 133 10/17/2022 0000   TRIG 122 10/17/2022 0000   HDL 49 10/17/2022 0000   CHOLHDL 2.4 11/23/2016 0700   VLDL 19 11/23/2016 0700   LDLCALC 65 10/17/2022 0000     Risk Assessment/Calculations:           Physical Exam:    VS:  BP (!) 140/50   Pulse 61   Ht 5\' 4"  (1.626 m)   Wt 171 lb (77.6 kg)   SpO2 97%   BMI 29.35 kg/m        Wt Readings from Last 3 Encounters:  12/15/22 171 lb (77.6 kg)  11/28/22 168 lb 6.4 oz (76.4 kg)  11/16/22 168 lb 6.4 oz (76.4 kg)     GEN:  Well nourished, well developed in no acute distress HEENT: Normal NECK: No JVD; No carotid bruits LYMPHATICS: No lymphadenopathy CARDIAC: RRR, no murmurs, rubs, gallops RESPIRATORY:  Clear to auscultation without rales, wheezing or rhonchi  ABDOMEN: Soft, non-tender, non-distended MUSCULOSKELETAL:  No edema; No deformity  SKIN: Warm and dry NEUROLOGIC:  Alert and oriented x 3 PSYCHIATRIC:  Normal affect   ASSESSMENT:    1. Coronary artery disease involving native coronary artery of native heart without angina pectoris   2. Medication management   3. Primary hypertension   4. Hyperlipidemia LDL goal <70   5. PAD (peripheral artery disease) (HCC)   6. Carotid stenosis, asymptomatic, bilateral    PLAN:    In order of problems listed above:  CAD: Denies any exertional chest pain recently.  On Lipitor and Plavix  Medication management: Obtain basic metabolic panel in 2 to 3 weeks to check renal function after switching losartan to irbesartan  Hyperlipidemia: On Lipitor  Hypertension: Blood pressure remains elevated despite manual recheck.  Currently on  amlodipine 10 mg daily, atenolol 100 mg daily, hydrochlorothiazide 25 mg daily and losartan 50 mg twice daily.  Will discontinue losartan and switch to irbesartan 150 mg daily.  If systolic blood pressure remain elevated above 140 mmHg after 2 weeks, may consider increase irbesartan to 300 mg daily  PAD: No significant claudication symptoms  Coronary artery disease: He previously underwent right CEA.  Recent carotid Doppler obtained in January 2024 demonstrated 1 to 39% stenosis in the right ICA, 40 to 59% stenosis in the left ICA.           Medication Adjustments/Labs and Tests Ordered: Current medicines are reviewed at length with the patient today.  Concerns regarding medicines are outlined above.  Orders Placed This Encounter  Procedures   Basic metabolic panel   Meds ordered this encounter  Medications   irbesartan (AVAPRO) 150 MG tablet    Sig:  Take 1 tablet (150 mg total) by mouth daily.    Dispense:  90 tablet    Refill:  3    Patient Instructions  Medication Instructions:   STOP Losartan  START Irebesartan 150 mg daily   *If you need a refill on your cardiac medications before your next appointment, please call your pharmacy*  Lab Work: Your physician recommends that you return for lab work in 3-4 weeks:  BMP  If you have labs (blood work) drawn today and your tests are completely normal, you will receive your results only by: MyChart Message (if you have MyChart) OR A paper copy in the mail If you have any lab test that is abnormal or we need to change your treatment, we will call you to review the results.  Testing/Procedures: NONE ordered at this time of appointment   Follow-Up: At [facility], you and your health needs are our priority.  As part of our continuing mission to provide you with exceptional heart care, we have created designated Provider Care Teams.  These Care Teams include your primary Cardiologist (physician) and Advanced Practice  Providers (APPs -  Physician Assistants and Nurse Practitioners) who all work together to provide you with the care you need, when you need it.  We recommend signing up for the patient portal called "MyChart".  Sign up information is provided on this After Visit Summary.  MyChart is used to connect with patients for Virtual Visits (Telemedicine).  Patients are able to view lab/test results, encounter notes, upcoming appointments, etc.  Non-urgent messages can be sent to your provider as well.   To learn more about what you can do with MyChart, go to [email or web address].    Your next appointment:   6 month(s)  Provider:   Nanetta Batty, MD     Other Instructions     Signed, Azalee Course, PA  12/17/2022 1:09 PM    [facility]

## 2022-12-15 NOTE — Patient Instructions (Signed)
Medication Instructions:   STOP Losartan  START Irebesartan 150 mg daily   *If you need a refill on your cardiac medications before your next appointment, please call your pharmacy*  Lab Work: Your physician recommends that you return for lab work in 3-4 weeks:  BMP  If you have labs (blood work) drawn today and your tests are completely normal, you will receive your results only by: MyChart Message (if you have MyChart) OR A paper copy in the mail If you have any lab test that is abnormal or we need to change your treatment, we will call you to review the results.  Testing/Procedures: NONE ordered at this time of appointment   Follow-Up: At [facility], you and your health needs are our priority.  As part of our continuing mission to provide you with exceptional heart care, we have created designated Provider Care Teams.  These Care Teams include your primary Cardiologist (physician) and Advanced Practice Providers (APPs -  Physician Assistants and Nurse Practitioners) who all work together to provide you with the care you need, when you need it.  We recommend signing up for the patient portal called "MyChart".  Sign up information is provided on this After Visit Summary.  MyChart is used to connect with patients for Virtual Visits (Telemedicine).  Patients are able to view lab/test results, encounter notes, upcoming appointments, etc.  Non-urgent messages can be sent to your provider as well.   To learn more about what you can do with MyChart, go to [email or web address].    Your next appointment:   6 month(s)  Provider:   Nanetta Batty, MD     Other Instructions

## 2022-12-17 ENCOUNTER — Encounter: Payer: Self-pay | Admitting: Physician Assistant

## 2022-12-20 ENCOUNTER — Other Ambulatory Visit: Payer: Self-pay | Admitting: Orthopedic Surgery

## 2022-12-20 DIAGNOSIS — R053 Chronic cough: Secondary | ICD-10-CM

## 2022-12-20 MED ORDER — HYDROCODONE BIT-HOMATROP MBR 5-1.5 MG/5ML PO SOLN
5.0000 mL | Freq: Four times a day (QID) | ORAL | 0 refills | Status: DC | PRN
Start: 2022-12-20 — End: 2023-01-12

## 2022-12-21 ENCOUNTER — Encounter: Payer: Self-pay | Admitting: Adult Health

## 2022-12-21 ENCOUNTER — Non-Acute Institutional Stay (SKILLED_NURSING_FACILITY): Payer: Medicare Other | Admitting: Adult Health

## 2022-12-21 DIAGNOSIS — R531 Weakness: Secondary | ICD-10-CM

## 2022-12-21 DIAGNOSIS — G252 Other specified forms of tremor: Secondary | ICD-10-CM

## 2022-12-21 MED ORDER — IRBESARTAN 150 MG PO TABS: 150.0000 mg | ORAL_TABLET | Freq: Every day | ORAL | 0 refills | Status: DC

## 2022-12-21 NOTE — Progress Notes (Signed)
Location:  [facility] Room Number: 149A Place of Service:  SNF [phone]) Provider:Linlee Cromie,NP  Mahlon Gammon, MD  Patient Care Team: Mahlon Gammon, MD as PCP - General (Internal Medicine) Runell Gess, MD as PCP - Cardiology (Cardiology)  Extended Emergency Contact Information Primary Emergency Contact: [name] Address: [address]          [place], [place] [address] [place] of [place] Home Phone: [phone] Mobile Phone: [phone] Relation: Spouse  Code Status:  DNR Goals of care: Advanced Directive information    12/21/2022    4:07 PM  Advanced Directives  Does Patient Have a Medical Advance Directive? Yes  Type of Estate agent of [place];Living will;Out of facility DNR (pink MOST or yellow form)  Does patient want to make changes to medical advance directive? No - Patient declined  Copy of Healthcare Power of Attorney in Chart? Yes - validated most recent copy scanned in chart (See row information)     Chief Complaint  Patient presents with   Acute Visit    Patient is being seen for tremors and weakness    HPI:  Pt is a 89 y.o. male seen today for an acute visit for tremors and weakness.   Mr. Hawes resides in skilled care. The nurse reports he had an unwitnessed fall. He was found sitting on the floor. He scraped a mole off but was otherwise not harmed. He did not pass out. Denies hitting his head. The nurse notes he is having more tremors. He has a hx of mild tremor but this seems worse. He is also weaker and seems to be falling asleep more. He does arouse easily. He has a hx of UTI and urinary retention. Denies any urinary symptoms at this time. He denies any sob or cough or bodyaches. Vitals are WNL accept his BP is elevated which has been an issue. He saw cardiology and they started him on irbrasartan 150 mg on 5/17     Past Medical History:  Diagnosis Date   AAA  (abdominal aortic aneurysm) (HCC)    asymptomatic   AAA (abdominal aortic aneurysm) (HCC) 2010   peripheral  angiogram-- bilateral SFA DISEASE  and 60 to 70% infrarenaal abd. aortic stenosis with 15 -mm gradient   Adenomatous colon polyp 11/2003   CAD (coronary artery disease)    Gait abnormality 02/13/2017   Gastroparesis    pt denies   GERD (gastroesophageal reflux disease)    w/ LPR   History of kidney stones    x2   Hyperlipidemia    Hypertension    IDA (iron deficiency anemia)    Peripheral arterial disease (HCC)    RCEA  by Dr [name]   PUD (peptic ulcer disease)    pt unaware   Vertigo    Past Surgical History:  Procedure Laterality Date   ABDOMINAL AORTOGRAM W/LOWER EXTREMITY Bilateral 08/05/2018   Procedure: ABDOMINAL AORTOGRAM W/LOWER EXTREMITY;  Surgeon: Runell Gess, MD;  Location: [facility] INVASIVE CV LAB;  Service: Cardiovascular;  Laterality: Bilateral;   ABDOMINAL AORTOGRAM W/LOWER EXTREMITY N/A 09/26/2021   Procedure: ABDOMINAL AORTOGRAM W/LOWER EXTREMITY;  Surgeon: Runell Gess, MD;  Location: [facility]AS[facility] CV [facility];  Service: Cardiovascular;  Laterality: N/A;   AORTOGRAM  04/24/2016    Abdominal aortogram, bilateral iliac angiogram, bifemoral runoff   CARDIAC CATHETERIZATION  05/12/2005   RCA   carotid doppler  01/24/2013   RICA endarterectomy,left CCA 0-49%; left bulb and  prox ICA 50-69%; bilaateral subclavian < 50%   CAROTID ENDARTERECTOMY  11/01/2010   CATARACT EXTRACTION     COLONOSCOPY     CORONARY ANGIOPLASTY  05/18/2005   2 STENTS distal RCA AND PROXIMAL-MID RCA   5 total stents per pt.   CYSTOSCOPY/URETEROSCOPY/HOLMIUM LASER/STENT PLACEMENT Left 01/11/2018   Procedure: LEFT URETEROSCOPY/HOLMIUM LASER/STENT PLACEMENT;  Surgeon: Crist Fat, MD;  Location: [facility];  Service: Urology;  Laterality: Left;   DOPPLER ECHOCARDIOGRAPHY  02/07/2012   EF 55%,SHOWED NO ISCHEMIA    HERNIA REPAIR     umbilical   lower arterial  doppler  02/04/2013    aotra 1.5 x 1.5 cm; distal abd aorta 70-99%,proximal common iliac arteries -very stenotic with increased velocities>50%,may be falsely elevated as a result of residual plaque from the distal aorta stenosis   lower extremity doppler  June 18 ,2013   ABI'S ABNORMAL, RABI was 0.88 and LABI 0.75 ,with 3-vessel  run off   NM MYOVIEW LTD  MAY 23,2011   showed no significant ischemia;   NM MYOVIEW LTD  04/22/2008   ef 77%,exercise capcity 6 METS ,exaggerated blood pressure response to exercise   PERIPHERAL VASCULAR CATHETERIZATION N/A 04/24/2016   Procedure: Lower Extremity Angiography;  Surgeon: Runell Gess, MD;  Location: [facility] INVASIVE CV LAB;  Service: Cardiovascular;  Laterality: N/A;   PERIPHERAL VASCULAR CATHETERIZATION  04/24/2016   Procedure: Peripheral Vascular Intervention;  Surgeon: Runell Gess, MD;  Location: [facility] INVASIVE CV LAB;  Service: Cardiovascular;;  Aorta   retrograde central aortic catheterization  05/19/2005   cutting balloon atherectomy, c-circ stenosis with DES STENTING CYPHER   TONSILLECTOMY     TOTAL HIP ARTHROPLASTY Right 07/11/2022   Procedure: TOTAL HIP ARTHROPLASTY ANTERIOR APPROACH;  Surgeon: Durene Romans, MD;  Location: [facility];  Service: Orthopedics;  Laterality: Right;   TOTAL KNEE ARTHROPLASTY Left 01/03/2019   Procedure: TOTAL KNEE ARTHROPLASTY;  Surgeon: Beverely Low, MD;  Location: [facility];  Service: Orthopedics;  Laterality: Left;  with IS block   TRANSURETHRAL RESECTION OF PROSTATE N/A 09/08/2016   Procedure: TRANSURETHRAL RESECTION OF THE PROSTATE (TURP);  Surgeon: Crist Fat, MD;  Location: [facility];  Service: Urology;  Laterality: N/A;   TRANSURETHRAL RESECTION OF PROSTATE     10-10-17  Dr. Marlou Porch   TRANSURETHRAL RESECTION OF PROSTATE N/A 10/10/2017   Procedure: TRANSURETHRAL RESECTION OF THE PROSTATE (TURP);  Surgeon: Crist Fat, MD;  Location: [facility];  Service: Urology;  Laterality: N/A;   VASECTOMY      Allergies  Allergen Reactions    Lisinopril Cough   Niaspan [Niacin Er] Rash    Outpatient Encounter Medications as of 12/21/2022  Medication Sig   acetaminophen (TYLENOL) 500 MG tablet Take 1 tablet (500 mg total) by mouth 2 (two) times daily as needed.   amLODipine (NORVASC) 10 MG tablet Take 10 mg by mouth daily.   Apoaequorin (PREVAGEN PO) Take 1 tablet by mouth daily.   atenolol (TENORMIN) 100 MG tablet Take 100 mg by mouth daily.   atorvastatin (LIPITOR) 40 MG tablet Take 40 mg by mouth daily at 6 PM.    Biotin 10 MG CAPS Take 10 mg by mouth daily.   Camphor-Menthol-Methyl Sal (SALONPAS) 3.08-05-08 % PTCH Apply 1 patch topically as needed. Special Instructions: Salonpas patch (on in am, off in pm) as needed to affected area   cetirizine (ZYRTEC) 10 MG tablet Take 1 tablet (10 mg total) by mouth at bedtime.   cholecalciferol (VITAMIN D3) 25 MCG (  1000 UT) tablet Take 1,000 Units by mouth daily.   clopidogrel (PLAVIX) 75 MG tablet Take 75 mg by mouth every evening.    finasteride (PROSCAR) 5 MG tablet Take 5 mg by mouth daily.   gabapentin (NEURONTIN) 100 MG capsule Take 100 mg by mouth 3 (three) times daily.   Glucosamine-Chondroitin 750-600 MG TABS Take 1 tablet by mouth 2 (two) times daily.   hydrALAZINE (APRESOLINE) 25 MG tablet Take 25 mg by mouth every 8 (eight) hours as needed. See other listing   hydrALAZINE (APRESOLINE) 50 MG tablet Take 1 tablet (50 mg total) by mouth 4 (four) times daily.   hydrochlorothiazide (MICROZIDE) 12.5 MG capsule Take 2 capsules (25 mg total) by mouth daily.   HYDROcodone bit-homatropine (HYCODAN) 5-1.5 MG/5ML syrup Take 5 mLs by mouth every 6 (six) hours as needed for cough.   iron polysaccharides (NIFEREX) 150 MG capsule Take 150 mg by mouth every Monday, Wednesday, and Friday.   melatonin 5 MG TABS Take 1 tablet (5 mg total) by mouth at bedtime.   Misc Natural Products (GLUCOSAMINE CHONDROITIN TRIPLE) TABS Take 1 tablet by mouth 2 (two) times daily.   Multiple Vitamin (MULTIVITAMIN  WITH MINERALS) TABS tablet Take 1 tablet by mouth daily.   pantoprazole (PROTONIX) 40 MG tablet Take 40 mg by mouth 2 (two) times daily.   polyethylene glycol (MIRALAX) 17 g packet Take 17 g by mouth daily as needed.   potassium chloride SA (K-DUR,KLOR-CON) 20 MEQ tablet Take 20 mEq by mouth every evening.    tamsulosin (FLOMAX) 0.4 MG CAPS capsule Take 1 capsule (0.4 mg total) by mouth daily after supper.   irbesartan (AVAPRO) 150 MG tablet Take 1 tablet (150 mg total) by mouth daily. (Patient not taking: Reported on 12/21/2022)   No facility-administered encounter medications on file as of 12/21/2022.    Review of Systems  Constitutional:  Negative for activity change, appetite change, chills, diaphoresis, fatigue, fever and unexpected weight change.  Respiratory:  Positive for cough (chronic dry). Negative for shortness of breath, wheezing and stridor.   Cardiovascular:  Negative for chest pain, palpitations and leg swelling.  Gastrointestinal:  Negative for abdominal distention, abdominal pain, constipation and diarrhea.  Genitourinary:  Negative for difficulty urinating, dysuria, flank pain, frequency and hematuria.  Musculoskeletal:  Positive for gait problem. Negative for arthralgias, back pain, joint swelling and myalgias.  Neurological:  Positive for tremors and weakness. Negative for dizziness, seizures, syncope, facial asymmetry, speech difficulty and headaches.  Hematological:  Negative for adenopathy. Does not bruise/bleed easily.  Psychiatric/Behavioral:  Negative for agitation, behavioral problems and confusion.     Immunization History  Administered Date(s) Administered   Fluad Quad(high Dose 65+) 05/02/2022   Influenza, High Dose Seasonal PF 03/31/2017, 03/29/2018   Influenza,inj,Quad PF,6+ Mos 04/25/2016, 05/06/2018   Influenza,inj,quad, With Preservative 05/26/2019   Influenza-Unspecified 05/14/2001, 05/31/2001, 05/14/2002, 05/15/2002, 05/18/2003, 05/25/2004, 04/30/2005,  05/29/2006, 05/01/2007, 05/11/2008, 05/31/2009, 05/31/2010, 04/30/2013, 05/31/2014, 03/31/2021   Moderna Covid-19 Vaccine Bivalent Booster 33yrs & up 08/31/2021   Moderna SARS-COV2 Booster Vaccination 06/15/2020, 11/19/2020, 01/02/2022   Moderna Sars-Covid-2 Vaccination 08/12/2019, 09/10/2019, 05/31/2022   Pfizer Covid-19 Vaccine Bivalent Booster 31yrs & up 11/23/2022   Pneumococcal Polysaccharide-23 05/02/2000, 09/24/2018   Rsv, Bivalent, Protein Subunit Rsvpref,pf Verdis Frederickson) 07/19/2022   Td 09/08/2013   Zoster Recombinat (Shingrix) 10/04/2017, 01/23/2018   Pertinent  Health Maintenance Due  Topic Date Due   INFLUENZA VACCINE  03/01/2023      07/12/2022    7:10 PM 07/13/2022  7:10 AM 07/28/2022    9:00 AM 10/14/2022    7:21 AM 11/28/2022    2:50 PM  Fall Risk  Falls in the past year?    1 1  Was there an injury with Fall?    1 0  Fall Risk Category Calculator    3 1  (RETIRED) Patient Fall Risk Level High fall risk High fall risk High fall risk    Patient at Risk for Falls Due to     History of fall(s);Impaired balance/gait;Impaired mobility  Fall risk Follow up    Falls evaluation completed Falls evaluation completed;Education provided;Falls prevention discussed   Functional Status Survey:    Vitals:   12/21/22 1604  BP: 128/64  Pulse: 60  Resp: 17  Temp: 97.9 F (36.6 C)  TempSrc: Temporal  SpO2: 97%  Weight: 170 lb 6.4 oz (77.3 kg)  Height: 5\' 4"  (1.626 m)   Body mass index is 29.25 kg/m. Physical Exam Vitals and nursing note reviewed.  Constitutional:      General: He is not in acute distress.    Appearance: He is not diaphoretic.  HENT:     Head: Normocephalic and atraumatic.  Neck:     Thyroid: No thyromegaly.     Vascular: No JVD.     Trachea: No tracheal deviation.  Cardiovascular:     Rate and Rhythm: Normal rate and regular rhythm.     Heart sounds: No murmur heard. Pulmonary:     Effort: Pulmonary effort is normal. No respiratory distress.      Breath sounds: Wheezing present. No rhonchi or rales.  Abdominal:     General: Bowel sounds are normal. There is no distension.     Palpations: Abdomen is soft.     Tenderness: There is no abdominal tenderness.  Musculoskeletal:     Cervical back: Normal range of motion and neck supple.  Lymphadenopathy:     Cervical: No cervical adenopathy.  Skin:    General: Skin is warm and dry.  Neurological:     General: No focal deficit present.     Cranial Nerves: No cranial nerve deficit.     Comments: General weakness. Falls asleep while talking. Able to arouse and f/c.       Labs reviewed: Recent Labs    07/11/22 0334 07/12/22 0329 07/12/22 0330 07/13/22 0335 07/20/22 0000 07/27/22 0000 10/17/22 0000  NA 142 143 143 145 141 140 142  K 3.8 4.6 4.6 4.0 4.2 3.9 4.0  CL 108 110 110 115* 107 103 105  CO2 26 25 24 25  23* 23* 26*  GLUCOSE 129* 179* 181* 186*  --   --   --   BUN 16 26* 24* 33* 33* 30* 21  CREATININE 0.84 1.32* 1.34* 1.04 1.0 1.5* 1.0  CALCIUM 8.7* 8.7* 8.7* 8.8* 9.0 9.4 9.6  MG 2.1 2.4  --  2.2  --   --   --   PHOS 3.3  --  3.4 2.8  --   --   --    Recent Labs    05/13/22 1510 05/13/22 2334 05/14/22 0452 07/11/22 0334 07/12/22 0330 07/13/22 0335 10/17/22 0000  AST 22 17 19   --   --   --  16  ALT 21 22 21   --   --   --  15  ALKPHOS 70 66 64  --   --   --  111  BILITOT 0.7 0.9 0.8  --   --   --   --  PROT 6.7 6.3* 6.1*  --   --   --   --   ALBUMIN 3.5 3.3* 3.2*   < > 2.7* 2.9* 4.1   < > = values in this interval not displayed.   Recent Labs    05/15/22 0613 05/16/22 0110 07/09/22 1450 07/11/22 0334 07/12/22 0329 07/13/22 0335 07/20/22 0000 07/27/22 0000 10/17/22 0000  WBC 7.3 8.7 9.0 10.2 14.4* 16.0* 11.0 15.5 7.6  NEUTROABS 5.0 6.4 7.3  --   --   --   --   --   --   HGB 13.5 12.6* 12.6* 12.2* 10.5* 9.6* 10.4* 10.5* 12.6*  HCT 40.6 38.4* 38.5* 37.6* 32.6* 30.0* 32* 33* 36*  MCV 83.2 83.3 87.1 88.3 89.6 89.3  --   --   --   PLT 195 189 257 202  201 205 258 296 217   Lab Results  Component Value Date   TSH 1.104 05/13/2022   Lab Results  Component Value Date   HGBA1C 5.5 11/23/2016   Lab Results  Component Value Date   CHOL 133 10/17/2022   HDL 49 10/17/2022   LDLCALC 65 10/17/2022   TRIG 122 10/17/2022   CHOLHDL 2.4 11/23/2016    Significant Diagnostic Results in last 30 days:  No results found.  Assessment/Plan  1. Weakness Vitals ok  Having some wheezing on exam. Will check CXR. Has hx of UTIs will check UA and have the bladder drained to check PVR  2. Coarse tremors Has underlying tremor wore now likely due weakness, will check electrolytes. No focal abnormality on exam  3. HTN Above goal, dose ordered to increase after two weeks.   Family/ staff Communication: discussed with his wife in the room.   Labs/tests ordered:  CBC CMP UA C and S CXR

## 2023-01-09 DIAGNOSIS — I10 Essential (primary) hypertension: Secondary | ICD-10-CM | POA: Diagnosis not present

## 2023-01-09 LAB — BASIC METABOLIC PANEL
BUN: 25 — AB (ref 4–21)
CO2: 22 (ref 13–22)
Chloride: 109 — AB (ref 99–108)
Creatinine: 1.1 (ref 0.6–1.3)
Glucose: 101
Potassium: 4.1 meq/L (ref 3.5–5.1)
Sodium: 144 (ref 137–147)

## 2023-01-09 LAB — COMPREHENSIVE METABOLIC PANEL
Calcium: 9.1 (ref 8.7–10.7)
eGFR: 61

## 2023-01-12 ENCOUNTER — Other Ambulatory Visit: Payer: Self-pay | Admitting: Internal Medicine

## 2023-01-12 DIAGNOSIS — R053 Chronic cough: Secondary | ICD-10-CM

## 2023-01-12 MED ORDER — HYDROCODONE BIT-HOMATROP MBR 5-1.5 MG/5ML PO SOLN
5.0000 mL | Freq: Four times a day (QID) | ORAL | 0 refills | Status: DC | PRN
Start: 2023-01-12 — End: 2023-02-09

## 2023-01-16 DIAGNOSIS — M2041 Other hammer toe(s) (acquired), right foot: Secondary | ICD-10-CM | POA: Diagnosis not present

## 2023-01-16 DIAGNOSIS — M79672 Pain in left foot: Secondary | ICD-10-CM | POA: Diagnosis not present

## 2023-01-16 DIAGNOSIS — M79671 Pain in right foot: Secondary | ICD-10-CM | POA: Diagnosis not present

## 2023-01-16 DIAGNOSIS — B351 Tinea unguium: Secondary | ICD-10-CM | POA: Diagnosis not present

## 2023-01-16 DIAGNOSIS — M2042 Other hammer toe(s) (acquired), left foot: Secondary | ICD-10-CM | POA: Diagnosis not present

## 2023-01-29 ENCOUNTER — Non-Acute Institutional Stay (SKILLED_NURSING_FACILITY): Payer: Medicare Other | Admitting: Internal Medicine

## 2023-01-29 ENCOUNTER — Encounter: Payer: Self-pay | Admitting: Internal Medicine

## 2023-01-29 DIAGNOSIS — F5101 Primary insomnia: Secondary | ICD-10-CM | POA: Diagnosis not present

## 2023-01-29 DIAGNOSIS — G3184 Mild cognitive impairment, so stated: Secondary | ICD-10-CM | POA: Diagnosis not present

## 2023-01-29 DIAGNOSIS — R053 Chronic cough: Secondary | ICD-10-CM | POA: Diagnosis not present

## 2023-01-29 DIAGNOSIS — R269 Unspecified abnormalities of gait and mobility: Secondary | ICD-10-CM | POA: Diagnosis not present

## 2023-01-29 DIAGNOSIS — G629 Polyneuropathy, unspecified: Secondary | ICD-10-CM

## 2023-01-29 DIAGNOSIS — K279 Peptic ulcer, site unspecified, unspecified as acute or chronic, without hemorrhage or perforation: Secondary | ICD-10-CM

## 2023-01-29 DIAGNOSIS — D509 Iron deficiency anemia, unspecified: Secondary | ICD-10-CM | POA: Diagnosis not present

## 2023-01-29 DIAGNOSIS — I10 Essential (primary) hypertension: Secondary | ICD-10-CM | POA: Diagnosis not present

## 2023-01-29 DIAGNOSIS — E782 Mixed hyperlipidemia: Secondary | ICD-10-CM

## 2023-01-29 NOTE — Progress Notes (Signed)
Location:   [facility] Room Number: 149A Place of Service:  SNF [phone]) Provider:  Einar Crow, MD  Mahlon Gammon, MD  Patient Care Team: Mahlon Gammon, MD as PCP - General (Internal Medicine) Runell Gess, MD as PCP - Cardiology (Cardiology)  Extended Emergency Contact Information Primary Emergency Contact: [name] Address: [address]          [place], [place] [address] [place] Home Phone: [phone] Mobile Phone: [phone] Relation: Spouse  Code Status:  DNR Goals of care: Advanced Directive information    01/29/2023    2:41 PM  Advanced Directives  Does Patient Have a Medical Advance Directive? Yes  Type of Advance Directive Living will;Out of facility DNR (pink MOST or yellow form)  Does patient want to make changes to medical advance directive? No - Patient declined  Copy of Healthcare Power of Attorney in Chart? Yes - validated most recent copy scanned in chart (See row information)  Pre-existing out of facility DNR order (yellow form or pink MOST form) Yellow form placed in chart (order not valid for inpatient use)     Chief Complaint  Patient presents with   Medical Management of Chronic Issues    Routine follow up   Immunizations    COVID booster due    HPI:  Pt is a 89 y.o. male seen today for medical management of chronic diseases.    Lives in SNF in [place] Due to help with his ADLS and recurrent falls [place] in [place] and Able to visit her during daytime  Patient has h/o hypertension, hyperlipidemia, PAD, CAD,BP  Also h/o Urinary Retention with Hydronephrosis and stent   Hx of AAA ICA stenosis and PAD, followed by Dr Allyson Sabal. Last carotid US showing bilateral ICA stenosis    Mild Cognitive impairment MMSE 27/30 in 03/24 S/p THA in 12/23  Patient was started on Irbrastatin by cardiology to help his blood pressure.  The patient started having weakness dizziness and had a fall.  He  was started back on losartan.  And his symptoms have resolved  Past Medical History:  Diagnosis Date   AAA (abdominal aortic aneurysm) (HCC)    asymptomatic   AAA (abdominal aortic aneurysm) (HCC) 2010   peripheral  angiogram-- bilateral SFA DISEASE  and 60 to 70% infrarenaal abd. aortic stenosis with 15 -mm gradient   Adenomatous colon polyp 11/2003   CAD (coronary artery disease)    Gait abnormality 02/13/2017   Gastroparesis    pt denies   GERD (gastroesophageal reflux disease)    w/ LPR   History of kidney stones    x2   Hyperlipidemia    Hypertension    IDA (iron deficiency anemia)    Peripheral arterial disease (HCC)    RCEA  by Dr [name]   PUD (peptic ulcer disease)    pt unaware   Vertigo    Past Surgical History:  Procedure Laterality Date   ABDOMINAL AORTOGRAM W/LOWER EXTREMITY Bilateral 08/05/2018   Procedure: ABDOMINAL AORTOGRAM W/LOWER EXTREMITY;  Surgeon: Runell Gess, MD;  Location: [facility] INVASIVE CV LAB;  Service: Cardiovascular;  Laterality: Bilateral;   ABDOMINAL AORTOGRAM W/LOWER EXTREMITY N/A 09/26/2021   Procedure: ABDOMINAL AORTOGRAM W/LOWER EXTREMITY;  Surgeon: Runell Gess, MD;  Location: [facility] IN[facility]AS[facility] CV [facility];  Service: Cardiovascular;  Laterality: N/A;   AORTOGRAM  04/24/2016    Abdominal aortogram, bilateral iliac angiogram, bifemoral runoff   CARDIAC CATHETERIZATION  05/12/2005  RCA   carotid doppler  01/24/2013   RICA endarterectomy,left CCA 0-49%; left bulb and prox ICA 50-69%; bilaateral subclavian < 50%   CAROTID ENDARTERECTOMY  11/01/2010   CATARACT EXTRACTION     COLONOSCOPY     CORONARY ANGIOPLASTY  05/18/2005   2 STENTS distal RCA AND PROXIMAL-MID RCA   5 total stents per pt.   CYSTOSCOPY/URETEROSCOPY/HOLMIUM LASER/STENT PLACEMENT Left 01/11/2018   Procedure: LEFT URETEROSCOPY/HOLMIUM LASER/STENT PLACEMENT;  Surgeon: Crist Fat, MD;  Location: [facility];  Service: Urology;  Laterality: Left;   DOPPLER ECHOCARDIOGRAPHY   02/07/2012   EF 55%,SHOWED NO ISCHEMIA    HERNIA REPAIR     umbilical   lower arterial  doppler  02/04/2013   aotra 1.5 x 1.5 cm; distal abd aorta 70-99%,proximal common iliac arteries -very stenotic with increased velocities>50%,may be falsely elevated as a result of residual plaque from the distal aorta stenosis   lower extremity doppler  June 18 ,2013   ABI'S ABNORMAL, RABI was 0.88 and LABI 0.75 ,with 3-vessel  run off   NM MYOVIEW LTD  MAY 23,2011   showed no significant ischemia;   NM MYOVIEW LTD  04/22/2008   ef 77%,exercise capcity 6 METS ,exaggerated blood pressure response to exercise   PERIPHERAL VASCULAR CATHETERIZATION N/A 04/24/2016   Procedure: Lower Extremity Angiography;  Surgeon: Runell Gess, MD;  Location: [facility] INVASIVE CV LAB;  Service: Cardiovascular;  Laterality: N/A;   PERIPHERAL VASCULAR CATHETERIZATION  04/24/2016   Procedure: Peripheral Vascular Intervention;  Surgeon: Runell Gess, MD;  Location: [facility] INVASIVE CV LAB;  Service: Cardiovascular;;  Aorta   retrograde central aortic catheterization  05/19/2005   cutting balloon atherectomy, c-circ stenosis with DES STENTING CYPHER   TONSILLECTOMY     TOTAL HIP ARTHROPLASTY Right 07/11/2022   Procedure: TOTAL HIP ARTHROPLASTY ANTERIOR APPROACH;  Surgeon: Durene Romans, MD;  Location: [facility];  Service: Orthopedics;  Laterality: Right;   TOTAL KNEE ARTHROPLASTY Left 01/03/2019   Procedure: TOTAL KNEE ARTHROPLASTY;  Surgeon: Beverely Low, MD;  Location: [facility];  Service: Orthopedics;  Laterality: Left;  with IS block   TRANSURETHRAL RESECTION OF PROSTATE N/A 09/08/2016   Procedure: TRANSURETHRAL RESECTION OF THE PROSTATE (TURP);  Surgeon: Crist Fat, MD;  Location: [facility];  Service: Urology;  Laterality: N/A;   TRANSURETHRAL RESECTION OF PROSTATE     10-10-17  Dr. Marlou Porch   TRANSURETHRAL RESECTION OF PROSTATE N/A 10/10/2017   Procedure: TRANSURETHRAL RESECTION OF THE PROSTATE (TURP);  Surgeon: Crist Fat,  MD;  Location: [facility];  Service: Urology;  Laterality: N/A;   VASECTOMY      Allergies  Allergen Reactions   Lisinopril Cough   Niaspan [Niacin Er] Rash    Allergies as of 01/29/2023       Reactions   Lisinopril Cough   Niaspan [niacin Er] Rash        Medication List        Accurate as of January 29, 2023  2:44 PM. If you have any questions, ask your nurse or doctor.          STOP taking these medications    irbesartan 150 MG tablet Commonly known as: Avapro Stopped by: Mahlon Gammon, MD       TAKE these medications    acetaminophen 500 MG tablet Commonly known as: TYLENOL Take 1 tablet (500 mg total) by mouth 2 (two) times daily as needed.   amLODipine 10 MG tablet Commonly known as: NORVASC Take 10 mg  by mouth daily.   ARTIFICIAL TEARS OP Apply 1 drop to eye 4 (four) times daily.   atenolol 100 MG tablet Commonly known as: TENORMIN Take 100 mg by mouth daily.   atorvastatin 40 MG tablet Commonly known as: LIPITOR Take 40 mg by mouth daily at 6 PM.   betamethasone dipropionate 0.05 % ointment Commonly known as: DIPROLENE Apply topically 2 (two) times daily as needed.   BIOTENE DRY MOUTH CARE DT Place 2 sprays onto teeth 4 (four) times daily as needed.   Biotin 10 MG Tabs Take 10 mg by mouth daily in the afternoon. What changed: Another medication with the same name was removed. Continue taking this medication, and follow the directions you see here. Changed by: Mahlon Gammon, MD   cetirizine 10 MG tablet Commonly known as: ZYRTEC Take 1 tablet (10 mg total) by mouth at bedtime.   cholecalciferol 25 MCG (1000 UNIT) tablet Commonly known as: VITAMIN D3 Take 1,000 Units by mouth daily.   clopidogrel 75 MG tablet Commonly known as: PLAVIX Take 75 mg by mouth every evening.   finasteride 5 MG tablet Commonly known as: PROSCAR Take 5 mg by mouth daily.   gabapentin 100 MG capsule Commonly known as: NEURONTIN Take 100 mg by mouth 3  (three) times daily.   Glucosamine Chondroitin Triple Tabs Take 1 tablet by mouth 2 (two) times daily.   Glucosamine-Chondroitin 750-600 MG Tabs Take 1 tablet by mouth 2 (two) times daily.   hydrALAZINE 25 MG tablet Commonly known as: APRESOLINE Take 25 mg by mouth every 8 (eight) hours as needed.   hydrALAZINE 50 MG tablet Commonly known as: APRESOLINE Take 1 tablet (50 mg total) by mouth 4 (four) times daily.   hydrochlorothiazide 12.5 MG capsule Commonly known as: MICROZIDE Take 2 capsules (25 mg total) by mouth daily.   HYDROcodone bit-homatropine 5-1.5 MG/5ML syrup Commonly known as: HYCODAN Take 5 mLs by mouth every 6 (six) hours as needed for cough.   iron polysaccharides 150 MG capsule Commonly known as: NIFEREX Take 150 mg by mouth every Monday, Wednesday, and Friday.   losartan 50 MG tablet Commonly known as: COZAAR Take 50 mg by mouth 2 (two) times daily.   melatonin 5 MG Tabs Take 1 tablet (5 mg total) by mouth at bedtime.   multivitamin with minerals Tabs tablet Take 1 tablet by mouth daily.   ondansetron 4 MG tablet Commonly known as: ZOFRAN Take 4 mg by mouth every 8 (eight) hours as needed for nausea or vomiting.   pantoprazole 40 MG tablet Commonly known as: PROTONIX Take 40 mg by mouth 2 (two) times daily.   polyethylene glycol 17 g packet Commonly known as: MiraLax Take 17 g by mouth daily as needed.   potassium chloride SA 20 MEQ tablet Commonly known as: KLOR-CON M Take 20 mEq by mouth every evening.   PREVAGEN PO Take 1 tablet by mouth daily.   Salonpas 3.08-05-08 % Ptch Generic drug: Camphor-Menthol-Methyl Sal Apply 1 patch topically as needed. Special Instructions: Salonpas patch (on in am, off in pm) as needed to affected area   tamsulosin 0.4 MG Caps capsule Commonly known as: FLOMAX Take 1 capsule (0.4 mg total) by mouth daily after supper.        Review of Systems  Constitutional:  Negative for activity change, appetite  change and unexpected weight change.  HENT: Negative.    Respiratory:  Negative for cough and shortness of breath.   Cardiovascular:  Negative for leg  swelling.  Gastrointestinal:  Negative for constipation.  Genitourinary:  Positive for frequency.  Musculoskeletal:  Positive for gait problem. Negative for arthralgias and myalgias.  Skin: Negative.  Negative for rash.  Neurological:  Negative for dizziness and weakness.  Psychiatric/Behavioral:  Positive for confusion. Negative for sleep disturbance.   All other systems reviewed and are negative.   Immunization History  Administered Date(s) Administered   Fluad Quad(high Dose 65+) 05/02/2022   Influenza, High Dose Seasonal PF 03/31/2017, 03/29/2018   Influenza,inj,Quad PF,6+ Mos 04/25/2016, 05/06/2018   Influenza,inj,quad, With Preservative 05/26/2019   Influenza-Unspecified 05/14/2001, 05/31/2001, 05/14/2002, 05/15/2002, 05/18/2003, 05/25/2004, 04/30/2005, 05/29/2006, 05/01/2007, 05/11/2008, 05/31/2009, 05/31/2010, 04/30/2013, 05/31/2014, 03/31/2021   Moderna Covid-19 Vaccine Bivalent Booster 68yrs & up 08/31/2021   Moderna SARS-COV2 Booster Vaccination 06/15/2020, 11/19/2020, 01/02/2022   Moderna Sars-Covid-2 Vaccination 08/12/2019, 09/10/2019, 05/31/2022   Pfizer Covid-19 Vaccine Bivalent Booster 19yrs & up 11/23/2022   Pneumococcal Polysaccharide-23 05/02/2000, 09/24/2018   Rsv, Bivalent, Protein Subunit Rsvpref,pf Verdis Frederickson) 07/19/2022   Td 09/08/2013   Zoster Recombinant(Shingrix) 10/04/2017, 01/23/2018   Pertinent  Health Maintenance Due  Topic Date Due   INFLUENZA VACCINE  03/01/2023      07/12/2022    7:10 PM 07/13/2022    7:10 AM 07/28/2022    9:00 AM 10/14/2022    7:21 AM 11/28/2022    2:50 PM  Fall Risk  Falls in the past year?    1 1  Was there an injury with Fall?    1 0  Fall Risk Category Calculator    3 1  (RETIRED) Patient Fall Risk Level High fall risk High fall risk High fall risk    Patient at Risk for  Falls Due to     History of fall(s);Impaired balance/gait;Impaired mobility  Fall risk Follow up    Falls evaluation completed Falls evaluation completed;Education provided;Falls prevention discussed   Functional Status Survey:    Vitals:   01/29/23 1400  BP: (!) 145/79  Pulse: 82  Resp: 18  Temp: 98.4 F (36.9 C)  SpO2: 96%  Weight: 150 lb 3.2 oz (68.1 kg)  Height: 5\' 4"  (1.626 m)   Body mass index is 25.78 kg/m. Physical Exam Vitals reviewed.  Constitutional:      Appearance: Normal appearance.  HENT:     Head: Normocephalic.     Nose: Nose normal.     Mouth/Throat:     Mouth: Mucous membranes are moist.     Pharynx: Oropharynx is clear.  Eyes:     Pupils: Pupils are equal, round, and reactive to light.  Cardiovascular:     Rate and Rhythm: Normal rate and regular rhythm.     Pulses: Normal pulses.     Heart sounds: No murmur heard. Pulmonary:     Effort: Pulmonary effort is normal. No respiratory distress.     Breath sounds: Normal breath sounds. No rales.  Abdominal:     General: Abdomen is flat. Bowel sounds are normal.     Palpations: Abdomen is soft.  Musculoskeletal:        General: Swelling present.     Cervical back: Neck supple.  Skin:    General: Skin is warm.  Neurological:     General: No focal deficit present.     Mental Status: He is alert and oriented to person, place, and time.  Psychiatric:        Mood and Affect: Mood normal.        Thought Content: Thought content normal.  Labs reviewed: Recent Labs    07/11/22 0334 07/12/22 0329 07/12/22 0330 07/13/22 0335 07/20/22 0000 07/27/22 0000 10/17/22 0000  NA 142 143 143 145 141 140 142  K 3.8 4.6 4.6 4.0 4.2 3.9 4.0  CL 108 110 110 115* 107 103 105  CO2 26 25 24 25  23* 23* 26*  GLUCOSE 129* 179* 181* 186*  --   --   --   BUN 16 26* 24* 33* 33* 30* 21  CREATININE 0.84 1.32* 1.34* 1.04 1.0 1.5* 1.0  CALCIUM 8.7* 8.7* 8.7* 8.8* 9.0 9.4 9.6  MG 2.1 2.4  --  2.2  --   --   --    PHOS 3.3  --  3.4 2.8  --   --   --    Recent Labs    05/13/22 1510 05/13/22 2334 05/14/22 0452 07/11/22 0334 07/12/22 0330 07/13/22 0335 10/17/22 0000  AST 22 17 19   --   --   --  16  ALT 21 22 21   --   --   --  15  ALKPHOS 70 66 64  --   --   --  111  BILITOT 0.7 0.9 0.8  --   --   --   --   PROT 6.7 6.3* 6.1*  --   --   --   --   ALBUMIN 3.5 3.3* 3.2*   < > 2.7* 2.9* 4.1   < > = values in this interval not displayed.   Recent Labs    05/15/22 0613 05/16/22 0110 07/09/22 1450 07/11/22 0334 07/12/22 0329 07/13/22 0335 07/20/22 0000 07/27/22 0000 10/17/22 0000  WBC 7.3 8.7 9.0 10.2 14.4* 16.0* 11.0 15.5 7.6  NEUTROABS 5.0 6.4 7.3  --   --   --   --   --   --   HGB 13.5 12.6* 12.6* 12.2* 10.5* 9.6* 10.4* 10.5* 12.6*  HCT 40.6 38.4* 38.5* 37.6* 32.6* 30.0* 32* 33* 36*  MCV 83.2 83.3 87.1 88.3 89.6 89.3  --   --   --   PLT 195 189 257 202 201 205 258 296 217   Lab Results  Component Value Date   TSH 1.104 05/13/2022   Lab Results  Component Value Date   HGBA1C 5.5 11/23/2016   Lab Results  Component Value Date   CHOL 133 10/17/2022   HDL 49 10/17/2022   LDLCALC 65 10/17/2022   TRIG 122 10/17/2022   CHOLHDL 2.4 11/23/2016    Significant Diagnostic Results in last 30 days:  No results found.  Assessment/Plan 1. Essential hypertension BP ranges from 160 -140 Will not make any changes Continue Amlodipine Hydralazine,Cozaar and Atenolol, HCTZ  2. Chronic cough On Chronic Hydrocodone syrup   3. Neuropathy Gabapentin  4. MCI (mild cognitive impairment) Doing well in SNF  5. Gait abnormality Uses Power chair and his walker  6. Peptic ulcer On Chronic Protonix  7. Iron deficiency anemia, unspecified iron deficiency anemia type Iron HGB good  8. Primary insomnia Melatonin  9. Mixed hyperlipidemia Statin LDL good on 03/24 10 BPH with h/o Urinary retention Proscar and Flomax 11 CAD with PAD s/p Stenting On Statin and Plavix  Family/ staff  Communication:   Labs/tests ordered:

## 2023-02-09 ENCOUNTER — Other Ambulatory Visit: Payer: Self-pay | Admitting: Adult Health

## 2023-02-09 DIAGNOSIS — R053 Chronic cough: Secondary | ICD-10-CM

## 2023-02-09 MED ORDER — HYDROCODONE BIT-HOMATROP MBR 5-1.5 MG/5ML PO SOLN: 5.0000 mL | Freq: Four times a day (QID) | ORAL | 0 refills | Status: DC | PRN

## 2023-02-19 ENCOUNTER — Non-Acute Institutional Stay (SKILLED_NURSING_FACILITY): Payer: Medicare Other | Admitting: Adult Health

## 2023-02-19 ENCOUNTER — Encounter: Payer: Self-pay | Admitting: Adult Health

## 2023-02-19 DIAGNOSIS — R0981 Nasal congestion: Secondary | ICD-10-CM

## 2023-02-19 DIAGNOSIS — R051 Acute cough: Secondary | ICD-10-CM

## 2023-02-19 NOTE — Progress Notes (Unsigned)
Location:  [facility] Room Number: 149A Place of Service:  SNF [phone]) Provider:  Tamsen Roers, MD  Patient Care Team: Mahlon Gammon, MD as PCP - General (Internal Medicine) Runell Gess, MD as PCP - Cardiology (Cardiology)  Extended Emergency Contact Information Primary Emergency Contact: [name] Address: [address]          [place], [place] [address] [place] of [place] Home Phone: [phone] Mobile Phone: [phone] Relation: Spouse  Code Status:  DNR Goals of care: Advanced Directive information    02/19/2023   11:23 AM  Advanced Directives  Does Patient Have a Medical Advance Directive? Yes  Type of Advance Directive Living will;Out of facility DNR (pink MOST or yellow form)  Does patient want to make changes to medical advance directive? No - Patient declined  Copy of Healthcare Power of Attorney in Chart? Yes - validated most recent copy scanned in chart (See row information)  Pre-existing out of facility DNR order (yellow form or pink MOST form) Yellow form placed in chart (order not valid for inpatient use)     Chief Complaint  Patient presents with   Acute Visit    Patient is being seen for a acute cough     HPI:  Pt is a 89 y.o. male seen today for an acute visit for acute cough and nasal congestion.   Mr. Gumz resides in skilled care. He has a chronic cough and uses hydrocodone syrup. The nurse report his cough has worsened the past 2-3 days with phlegm production. He is also having nasal congestion. No fever or sob. He has had two rapid covid swabs both of which are negative. 02 sats are normal.  He is slightly weaker and needs more assistance. He reports he is now feeling better. No body aches or headaches. No GI symptoms.    Past Medical History:  Diagnosis Date   AAA (abdominal aortic aneurysm) (HCC)    asymptomatic   AAA (abdominal aortic aneurysm) (HCC) 2010    peripheral  angiogram-- bilateral SFA DISEASE  and 60 to 70% infrarenaal abd. aortic stenosis with 15 -mm gradient   Adenomatous colon polyp 11/2003   CAD (coronary artery disease)    Gait abnormality 02/13/2017   Gastroparesis    pt denies   GERD (gastroesophageal reflux disease)    w/ LPR   History of kidney stones    x2   Hyperlipidemia    Hypertension    IDA (iron deficiency anemia)    Peripheral arterial disease (HCC)    RCEA  by Dr [name]   PUD (peptic ulcer disease)    pt unaware   Vertigo    Past Surgical History:  Procedure Laterality Date   ABDOMINAL AORTOGRAM W/LOWER EXTREMITY Bilateral 08/05/2018   Procedure: ABDOMINAL AORTOGRAM W/LOWER EXTREMITY;  Surgeon: Runell Gess, MD;  Location: [facility] INVASIVE CV LAB;  Service: Cardiovascular;  Laterality: Bilateral;   ABDOMINAL AORTOGRAM W/LOWER EXTREMITY N/A 09/26/2021   Procedure: ABDOMINAL AORTOGRAM W/LOWER EXTREMITY;  Surgeon: Runell Gess, MD;  Location: [facility] INVAS[facility] CV [facility];  Service: Cardiovascular;  Laterality: N/A;   AORTOGRAM  04/24/2016    Abdominal aortogram, bilateral iliac angiogram, bifemoral runoff   CARDIAC CATHETERIZATION  05/12/2005   RCA   carotid doppler  01/24/2013   RICA endarterectomy,left CCA 0-49%; left bulb and prox ICA 50-69%; bilaateral subclavian < 50%   CAROTID ENDARTERECTOMY  11/01/2010   CATARACT EXTRACTION  COLONOSCOPY     CORONARY ANGIOPLASTY  05/18/2005   2 STENTS distal RCA AND PROXIMAL-MID RCA   5 total stents per pt.   CYSTOSCOPY/URETEROSCOPY/HOLMIUM LASER/STENT PLACEMENT Left 01/11/2018   Procedure: LEFT URETEROSCOPY/HOLMIUM LASER/STENT PLACEMENT;  Surgeon: Crist Fat, MD;  Location: [facility];  Service: Urology;  Laterality: Left;   DOPPLER ECHOCARDIOGRAPHY  02/07/2012   EF 55%,SHOWED NO ISCHEMIA    HERNIA REPAIR     umbilical   lower arterial  doppler  02/04/2013   aotra 1.5 x 1.5 cm; distal abd aorta 70-99%,proximal common iliac arteries -very stenotic with  increased velocities>50%,may be falsely elevated as a result of residual plaque from the distal aorta stenosis   lower extremity doppler  June 18 ,2013   ABI'S ABNORMAL, RABI was 0.88 and LABI 0.75 ,with 3-vessel  run off   NM MYOVIEW LTD  MAY 23,2011   showed no significant ischemia;   NM MYOVIEW LTD  04/22/2008   ef 77%,exercise capcity 6 METS ,exaggerated blood pressure response to exercise   PERIPHERAL VASCULAR CATHETERIZATION N/A 04/24/2016   Procedure: Lower Extremity Angiography;  Surgeon: Runell Gess, MD;  Location: [facility] INVASIVE CV LAB;  Service: Cardiovascular;  Laterality: N/A;   PERIPHERAL VASCULAR CATHETERIZATION  04/24/2016   Procedure: Peripheral Vascular Intervention;  Surgeon: Runell Gess, MD;  Location: [facility] INVASIVE CV LAB;  Service: Cardiovascular;;  Aorta   retrograde central aortic catheterization  05/19/2005   cutting balloon atherectomy, c-circ stenosis with DES STENTING CYPHER   TONSILLECTOMY     TOTAL HIP ARTHROPLASTY Right 07/11/2022   Procedure: TOTAL HIP ARTHROPLASTY ANTERIOR APPROACH;  Surgeon: Durene Romans, MD;  Location: [facility];  Service: Orthopedics;  Laterality: Right;   TOTAL KNEE ARTHROPLASTY Left 01/03/2019   Procedure: TOTAL KNEE ARTHROPLASTY;  Surgeon: Beverely Low, MD;  Location: [facility];  Service: Orthopedics;  Laterality: Left;  with IS block   TRANSURETHRAL RESECTION OF PROSTATE N/A 09/08/2016   Procedure: TRANSURETHRAL RESECTION OF THE PROSTATE (TURP);  Surgeon: Crist Fat, MD;  Location: [facility];  Service: Urology;  Laterality: N/A;   TRANSURETHRAL RESECTION OF PROSTATE     10-10-17  Dr. Marlou Porch   TRANSURETHRAL RESECTION OF PROSTATE N/A 10/10/2017   Procedure: TRANSURETHRAL RESECTION OF THE PROSTATE (TURP);  Surgeon: Crist Fat, MD;  Location: [facility];  Service: Urology;  Laterality: N/A;   VASECTOMY      Allergies  Allergen Reactions   Lisinopril Cough   Niaspan [Niacin Er] Rash    Outpatient Encounter Medications as of  02/19/2023  Medication Sig   acetaminophen (TYLENOL) 500 MG tablet Take 1 tablet (500 mg total) by mouth 2 (two) times daily as needed.   amLODipine (NORVASC) 10 MG tablet Take 10 mg by mouth daily.   Apoaequorin (PREVAGEN PO) Take 1 tablet by mouth daily.   atenolol (TENORMIN) 100 MG tablet Take 100 mg by mouth daily.   atorvastatin (LIPITOR) 40 MG tablet Take 40 mg by mouth daily at 6 PM.    betamethasone dipropionate (DIPROLENE) 0.05 % ointment Apply topically 2 (two) times daily as needed.   Biotin 10 MG TABS Take 10 mg by mouth daily in the afternoon.   Carboxymethylcellulose Sodium (ARTIFICIAL TEARS OP) Apply 1 drop to eye 4 (four) times daily.   cetirizine (ZYRTEC) 10 MG tablet Take 1 tablet (10 mg total) by mouth at bedtime.   cholecalciferol (VITAMIN D3) 25 MCG (1000 UT) tablet Take 1,000 Units by mouth daily.   clopidogrel (PLAVIX) 75 MG  tablet Take 75 mg by mouth every evening.    Dentifrices (BIOTENE DRY MOUTH CARE DT) Place 2 sprays onto teeth 4 (four) times daily as needed.   finasteride (PROSCAR) 5 MG tablet Take 5 mg by mouth daily.   gabapentin (NEURONTIN) 100 MG capsule Take 100 mg by mouth 3 (three) times daily.   Glucosamine-Chondroitin 750-600 MG TABS Take 1 tablet by mouth 2 (two) times daily.   hydrALAZINE (APRESOLINE) 25 MG tablet Take 25 mg by mouth every 8 (eight) hours as needed.   hydrALAZINE (APRESOLINE) 50 MG tablet Take 1 tablet (50 mg total) by mouth 4 (four) times daily.   hydrochlorothiazide (MICROZIDE) 12.5 MG capsule Take 2 capsules (25 mg total) by mouth daily.   HYDROcodone bit-homatropine (HYCODAN) 5-1.5 MG/5ML syrup Take 5 mLs by mouth every 6 (six) hours as needed for cough.   iron polysaccharides (NIFEREX) 150 MG capsule Take 150 mg by mouth every Monday, Wednesday, and Friday.   losartan (COZAAR) 50 MG tablet Take 50 mg by mouth 2 (two) times daily.   melatonin 5 MG TABS Take 1 tablet (5 mg total) by mouth at bedtime.   Misc Natural Products  (GLUCOSAMINE CHONDROITIN TRIPLE) TABS Take 1 tablet by mouth 2 (two) times daily.   Multiple Vitamin (MULTIVITAMIN WITH MINERALS) TABS tablet Take 1 tablet by mouth daily.   ondansetron (ZOFRAN) 4 MG tablet Take 4 mg by mouth every 8 (eight) hours as needed for nausea or vomiting.   pantoprazole (PROTONIX) 40 MG tablet Take 40 mg by mouth 2 (two) times daily.   potassium chloride SA (K-DUR,KLOR-CON) 20 MEQ tablet Take 20 mEq by mouth every evening.    tamsulosin (FLOMAX) 0.4 MG CAPS capsule Take 1 capsule (0.4 mg total) by mouth daily after supper.   Camphor-Menthol-Methyl Sal (SALONPAS) 3.08-05-08 % PTCH Apply 1 patch topically as needed. Special Instructions: Salonpas patch (on in am, off in pm) as needed to affected area (Patient not taking: Reported on 02/19/2023)   [DISCONTINUED] polyethylene glycol (MIRALAX) 17 g packet Take 17 g by mouth daily as needed. (Patient not taking: Reported on 02/19/2023)   No facility-administered encounter medications on file as of 02/19/2023.    Review of Systems  Constitutional:  Positive for activity change and fatigue. Negative for appetite change, chills, diaphoresis, fever and unexpected weight change.  HENT:  Positive for congestion, postnasal drip and rhinorrhea. Negative for dental problem, drooling, ear discharge, ear pain, facial swelling, hearing loss, mouth sores, nosebleeds, sinus pressure, sinus pain, sneezing, sore throat, tinnitus and trouble swallowing.   Respiratory:  Positive for cough. Negative for shortness of breath, wheezing and stridor.   Cardiovascular:  Negative for chest pain, palpitations and leg swelling.  Gastrointestinal:  Negative for abdominal distention, abdominal pain, constipation and diarrhea.  Genitourinary:  Negative for difficulty urinating and dysuria.  Musculoskeletal:  Positive for gait problem. Negative for arthralgias, back pain, joint swelling and myalgias.  Neurological:  Negative for dizziness, seizures, syncope,  facial asymmetry, speech difficulty, weakness and headaches.  Hematological:  Negative for adenopathy. Does not bruise/bleed easily.  Psychiatric/Behavioral:  Negative for agitation, behavioral problems and confusion.     Immunization History  Administered Date(s) Administered   Fluad Quad(high Dose 65+) 05/02/2022   Influenza, High Dose Seasonal PF 03/31/2017, 03/29/2018   Influenza,inj,Quad PF,6+ Mos 04/25/2016, 05/06/2018   Influenza,inj,quad, With Preservative 05/26/2019   Influenza-Unspecified 05/14/2001, 05/31/2001, 05/14/2002, 05/15/2002, 05/18/2003, 05/25/2004, 04/30/2005, 05/29/2006, 05/01/2007, 05/11/2008, 05/31/2009, 05/31/2010, 04/30/2013, 05/31/2014, 03/31/2021   Moderna Covid-19 Vaccine Bivalent Booster  42yrs & up 08/31/2021   Moderna SARS-COV2 Booster Vaccination 06/15/2020, 11/19/2020, 01/02/2022   Moderna Sars-Covid-2 Vaccination 08/12/2019, 09/10/2019, 05/31/2022   Pfizer Covid-19 Vaccine Bivalent Booster 58yrs & up 11/23/2022   Pneumococcal Polysaccharide-23 05/02/2000, 09/24/2018   Rsv, Bivalent, Protein Subunit Rsvpref,pf Verdis Frederickson) 07/19/2022   Td 09/08/2013   Zoster Recombinant(Shingrix) 10/04/2017, 01/23/2018   Pertinent  Health Maintenance Due  Topic Date Due   INFLUENZA VACCINE  03/01/2023      07/12/2022    7:10 PM 07/13/2022    7:10 AM 07/28/2022    9:00 AM 10/14/2022    7:21 AM 11/28/2022    2:50 PM  Fall Risk  Falls in the past year?    1 1  Was there an injury with Fall?    1 0  Fall Risk Category Calculator    3 1  (RETIRED) Patient Fall Risk Level High fall risk High fall risk High fall risk    Patient at Risk for Falls Due to     History of fall(s);Impaired balance/gait;Impaired mobility  Fall risk Follow up    Falls evaluation completed Falls evaluation completed;Education provided;Falls prevention discussed   Functional Status Survey:    Vitals:   02/19/23 1121  BP: (!) 144/80  Pulse: 61  Resp: 18  Temp: 98.1 F (36.7 C)  TempSrc:  Temporal  SpO2: 94%  Height: 5\' 4"  (1.626 m)   Body mass index is 25.78 kg/m. Physical Exam Vitals and nursing note reviewed.  Constitutional:      General: He is not in acute distress.    Appearance: He is not diaphoretic.  HENT:     Head: Normocephalic and atraumatic.     Right Ear: Tympanic membrane and ear canal normal.     Left Ear: Tympanic membrane and ear canal normal.     Nose: Congestion present.     Mouth/Throat:     Mouth: Mucous membranes are moist.     Pharynx: Oropharynx is clear. No oropharyngeal exudate.  Eyes:     Conjunctiva/sclera: Conjunctivae normal.     Pupils: Pupils are equal, round, and reactive to light.  Neck:     Thyroid: No thyromegaly.     Vascular: No JVD.     Trachea: No tracheal deviation.  Cardiovascular:     Rate and Rhythm: Normal rate and regular rhythm.     Heart sounds: No murmur heard. Pulmonary:     Effort: Pulmonary effort is normal. No respiratory distress.     Breath sounds: Normal breath sounds. No wheezing.     Comments: He had rhonchi in the right lung that cleared with cough/throat clearing Abdominal:     General: Bowel sounds are normal. There is no distension.     Palpations: Abdomen is soft.     Tenderness: There is no abdominal tenderness.  Musculoskeletal:     Cervical back: No tenderness.  Lymphadenopathy:     Cervical: No cervical adenopathy.  Skin:    General: Skin is warm and dry.  Neurological:     Mental Status: He is alert and oriented to person, place, and time.     Cranial Nerves: No cranial nerve deficit.     Labs reviewed: Recent Labs    07/11/22 0334 07/12/22 0329 07/12/22 0330 07/13/22 0335 07/20/22 0000 07/27/22 0000 10/17/22 0000  NA 142 143 143 145 141 140 142  K 3.8 4.6 4.6 4.0 4.2 3.9 4.0  CL 108 110 110 115* 107 103 105  CO2 26 25 24  25  23* 23* 26*  GLUCOSE 129* 179* 181* 186*  --   --   --   BUN 16 26* 24* 33* 33* 30* 21  CREATININE 0.84 1.32* 1.34* 1.04 1.0 1.5* 1.0  CALCIUM  8.7* 8.7* 8.7* 8.8* 9.0 9.4 9.6  MG 2.1 2.4  --  2.2  --   --   --   PHOS 3.3  --  3.4 2.8  --   --   --    Recent Labs    05/13/22 1510 05/13/22 2334 05/14/22 0452 07/11/22 0334 07/12/22 0330 07/13/22 0335 10/17/22 0000  AST 22 17 19   --   --   --  16  ALT 21 22 21   --   --   --  15  ALKPHOS 70 66 64  --   --   --  111  BILITOT 0.7 0.9 0.8  --   --   --   --   PROT 6.7 6.3* 6.1*  --   --   --   --   ALBUMIN 3.5 3.3* 3.2*   < > 2.7* 2.9* 4.1   < > = values in this interval not displayed.   Recent Labs    05/15/22 0613 05/16/22 0110 07/09/22 1450 07/11/22 0334 07/12/22 0329 07/13/22 0335 07/20/22 0000 07/27/22 0000 10/17/22 0000  WBC 7.3 8.7 9.0 10.2 14.4* 16.0* 11.0 15.5 7.6  NEUTROABS 5.0 6.4 7.3  --   --   --   --   --   --   HGB 13.5 12.6* 12.6* 12.2* 10.5* 9.6* 10.4* 10.5* 12.6*  HCT 40.6 38.4* 38.5* 37.6* 32.6* 30.0* 32* 33* 36*  MCV 83.2 83.3 87.1 88.3 89.6 89.3  --   --   --   PLT 195 189 257 202 201 205 258 296 217   Lab Results  Component Value Date   TSH 1.104 05/13/2022   Lab Results  Component Value Date   HGBA1C 5.5 11/23/2016   Lab Results  Component Value Date   CHOL 133 10/17/2022   HDL 49 10/17/2022   LDLCALC 65 10/17/2022   TRIG 122 10/17/2022   CHOLHDL 2.4 11/23/2016    Significant Diagnostic Results in last 30 days:  No results found.  Assessment/Plan  1. Acute cough No fever or sob Lungs are clear Neg covid swab x 2 Recommend guaifenesin for cough without DM due to HTN Encourage fluids If not improving or worsening notify [facility] for CXR and further direction   2. Nasal congestion Neg covid x 2  See #1   Family/ staff Communication: nurse  Labs/tests ordered:  NA

## 2023-02-20 ENCOUNTER — Encounter: Payer: Self-pay | Admitting: Adult Health

## 2023-02-20 MED ORDER — GUAIFENESIN 100 MG/5ML PO LIQD: 20.0000 mL | Freq: Three times a day (TID) | ORAL | Status: AC

## 2023-03-01 ENCOUNTER — Other Ambulatory Visit: Payer: Self-pay

## 2023-03-01 ENCOUNTER — Emergency Department (HOSPITAL_COMMUNITY): Payer: Medicare Other

## 2023-03-01 ENCOUNTER — Emergency Department (HOSPITAL_COMMUNITY)
Admission: EM | Admit: 2023-03-01 | Discharge: 2023-03-01 | Disposition: A | Discharge status: Home / Self Care | Payer: Medicare Other | Attending: Emergency Medicine | Admitting: Emergency Medicine

## 2023-03-01 DIAGNOSIS — R6889 Other general symptoms and signs: Secondary | ICD-10-CM | POA: Diagnosis not present

## 2023-03-01 DIAGNOSIS — S3991XA Unspecified injury of abdomen, initial encounter: Secondary | ICD-10-CM | POA: Insufficient documentation

## 2023-03-01 DIAGNOSIS — S12000A Unspecified displaced fracture of first cervical vertebra, initial encounter for closed fracture: Secondary | ICD-10-CM | POA: Diagnosis not present

## 2023-03-01 DIAGNOSIS — S12090A Other displaced fracture of first cervical vertebra, initial encounter for closed fracture: Secondary | ICD-10-CM | POA: Diagnosis not present

## 2023-03-01 DIAGNOSIS — Y92129 Unspecified place in nursing home as the place of occurrence of the external cause: Secondary | ICD-10-CM | POA: Insufficient documentation

## 2023-03-01 DIAGNOSIS — Z96641 Presence of right artificial hip joint: Secondary | ICD-10-CM | POA: Diagnosis not present

## 2023-03-01 DIAGNOSIS — I2510 Atherosclerotic heart disease of native coronary artery without angina pectoris: Secondary | ICD-10-CM | POA: Insufficient documentation

## 2023-03-01 DIAGNOSIS — Z955 Presence of coronary angioplasty implant and graft: Secondary | ICD-10-CM | POA: Insufficient documentation

## 2023-03-01 DIAGNOSIS — Z043 Encounter for examination and observation following other accident: Secondary | ICD-10-CM | POA: Diagnosis not present

## 2023-03-01 DIAGNOSIS — M1612 Unilateral primary osteoarthritis, left hip: Secondary | ICD-10-CM | POA: Diagnosis not present

## 2023-03-01 DIAGNOSIS — K5730 Diverticulosis of large intestine without perforation or abscess without bleeding: Secondary | ICD-10-CM | POA: Diagnosis not present

## 2023-03-01 DIAGNOSIS — W19XXXA Unspecified fall, initial encounter: Secondary | ICD-10-CM | POA: Diagnosis not present

## 2023-03-01 DIAGNOSIS — R404 Transient alteration of awareness: Secondary | ICD-10-CM | POA: Diagnosis not present

## 2023-03-01 DIAGNOSIS — Z96652 Presence of left artificial knee joint: Secondary | ICD-10-CM | POA: Insufficient documentation

## 2023-03-01 DIAGNOSIS — S0990XA Unspecified injury of head, initial encounter: Secondary | ICD-10-CM | POA: Insufficient documentation

## 2023-03-01 DIAGNOSIS — S299XXA Unspecified injury of thorax, initial encounter: Secondary | ICD-10-CM | POA: Diagnosis not present

## 2023-03-01 DIAGNOSIS — S199XXA Unspecified injury of neck, initial encounter: Secondary | ICD-10-CM | POA: Diagnosis present

## 2023-03-01 DIAGNOSIS — I10 Essential (primary) hypertension: Secondary | ICD-10-CM | POA: Insufficient documentation

## 2023-03-01 DIAGNOSIS — R9082 White matter disease, unspecified: Secondary | ICD-10-CM | POA: Diagnosis not present

## 2023-03-01 DIAGNOSIS — Z743 Need for continuous supervision: Secondary | ICD-10-CM | POA: Diagnosis not present

## 2023-03-01 DIAGNOSIS — I700 Atherosclerosis of aorta: Secondary | ICD-10-CM | POA: Diagnosis not present

## 2023-03-01 LAB — I-STAT CHEM 8, ED
BUN: 20 mg/dL (ref 8–23)
Calcium, Ion: 1.23 mmol/L (ref 1.15–1.40)
Chloride: 107 mmol/L (ref 98–111)
Creatinine, Ser: 1.1 mg/dL (ref 0.61–1.24)
Glucose, Bld: 109 mg/dL — ABNORMAL HIGH (ref 70–99)
HCT: 37 % — ABNORMAL LOW (ref 39.0–52.0)
Hemoglobin: 12.6 g/dL — ABNORMAL LOW (ref 13.0–17.0)
Potassium: 4.2 mmol/L (ref 3.5–5.1)
Sodium: 142 mmol/L (ref 135–145)
TCO2: 23 mmol/L (ref 22–32)

## 2023-03-01 MED ORDER — IOHEXOL 350 MG/ML SOLN
75.0000 mL | Freq: Once | INTRAVENOUS | Status: AC | PRN
  Administered 2023-03-01: 75 mL via INTRAVENOUS

## 2023-03-01 NOTE — ED Notes (Signed)
Patient transported to CT with nurse

## 2023-03-01 NOTE — ED Notes (Signed)
Patient  X-ray present

## 2023-03-01 NOTE — ED Provider Notes (Signed)
Clinical Course as of 03/02/23 0044  Thu Mar 01, 2023  1523 Stable. 89yM s/p mechanical fall. On plavix. Struck head. Leveled trauma. No evidence of trauma on exam. Getting trauma imaging - f/u imaging results for dispo [CD]  1550 Cervical spine CT: Left lamina of C1 fracture per radiology.  - no neuro deficits on exam [CD]    Clinical Course User Index [CD] Rhys Martini, DO    Physical Exam  BP (!) 177/64   Pulse 71   Temp 97.9 F (36.6 C)   Resp 16   Ht 5\' 4"  (1.626 m)   Wt 77.1 kg   SpO2 94%   BMI 29.18 kg/m   Physical Exam Vitals and nursing note reviewed.  Constitutional:      General: He is not in acute distress.    Appearance: He is well-developed.  HENT:     Head: Normocephalic and atraumatic.  Eyes:     Conjunctiva/sclera: Conjunctivae normal.     Pupils: Pupils are equal, round, and reactive to light.  Neck:     Comments: Wearing cervical collar Cardiovascular:     Rate and Rhythm: Normal rate and regular rhythm.  Pulmonary:     Effort: Pulmonary effort is normal. No respiratory distress.  Abdominal:     General: There is no distension.     Palpations: Abdomen is soft.  Musculoskeletal:        General: No swelling or deformity.     Cervical back: Neck supple.  Skin:    General: Skin is warm and dry.     Capillary Refill: Capillary refill takes less than 2 seconds.  Neurological:     Mental Status: He is alert.     Comments: Alert and oriented.  Cranial nerves II through XII intact, strength 5/5 in all extremities, sensation intact throughout  Psychiatric:        Mood and Affect: Mood normal.     Procedures  Procedures  ED Course / MDM   Clinical Course as of 03/02/23 0044  Thu Mar 01, 2023  1523 Stable. 89yM s/p mechanical fall. On plavix. Struck head. Leveled trauma. No evidence of trauma on exam. Getting trauma imaging - f/u imaging results for dispo [CD]  1550 Cervical spine CT: Left lamina of C1 fracture per radiology.  - no  neuro deficits on exam [CD]    Clinical Course User Index [CD] Rhys Martini, DO   Medical Decision Making Problems Addressed: Fall, initial encounter: complicated acute illness or injury Other closed displaced fracture of first cervical vertebra, initial encounter [facility]): complicated acute illness or injury  Amount and/or Complexity of Data Reviewed Independent Historian: caregiver Radiology: ordered and independent interpretation performed.  Risk Prescription drug management. Decision regarding hospitalization.   Radiology images reviewed.  He does have a fracture of C1.  No other acute injuries are noted CT imaging.  He is maintained in cervical collar.  Repeat neurologic exam performed as described above but showing no acute neurologic deficits.  Discussed case with neurosurgery team.  They are in agreement with continuing cervical collar.  He will follow-up with neurosurgery in the clinic in a few weeks.  No need for discontinuing blood thinners this time in regards to vertebral injury.  Spouse at the bedside states that patient is nonambulatory at baseline and gets around with a wheelchair at [employer]  He is not in any pain at this time.  Discussed need for continued wearing of the cervical collar they are provided strict return precautions.  Patient and family state understanding and agree with plan for discharge at this time with close follow-up with neurosurgery.       Rhys Martini, DO 03/02/23 4098    Gerhard Munch, MD 03/04/23 1725

## 2023-03-01 NOTE — ED Notes (Signed)
Patient discharged with C-collar on. Patient, wife, and Donnamae Jude RN ([facility]) understood all care.

## 2023-03-01 NOTE — Discharge Instructions (Signed)
Keep cervical collar on at all times.  Monitor for any signs of worsening including increased pain, numbness, weakness, or changes in mental status.  Follow-up with your primary care physician in the next 2 to 3 days.  Follow-up with neurosurgery as well.  They will likely schedule you an appointment in the next 2 to 3 weeks.  Return to the ED for any worsening of symptoms or further concerns.

## 2023-03-01 NOTE — ED Triage Notes (Signed)
Patient BIB EMS [facility], transferring from walker to wheelchair and slipped hitting hit on floor. Patient is on Plavix, Denies LOC. C/O Lower back pain. 20, 182/88, 95%RA, 71, CBG 119

## 2023-03-01 NOTE — Progress Notes (Signed)
Orthopedic Tech Progress Note Patient Details:  Nathan Santiago [facility] 03-13-1931 [id number]  Level II trauma, ortho techs not needed at this time.  Patient ID: Nathan Santiago, male   DOB: [birth year], 89 y.o.   MRN: [id number]  Docia Furl 03/01/2023, 2:48 PM

## 2023-03-01 NOTE — ED Notes (Signed)
Patient A&Ox4 no confusion noted. No s/s of any distress. Will continue to monitor

## 2023-03-01 NOTE — ED Notes (Signed)
C-collar placed on patient.

## 2023-03-01 NOTE — ED Notes (Signed)
Patient BIB EMS [facility], transferring from walker to wheelchair and slipped hitting hit on floor. Patient is on Plavix, Denies LOC. C/O Lower back pain. 20, 182/88, 95%RA, 71, CBG 119

## 2023-03-01 NOTE — ED Provider Notes (Signed)
[place] EMERGENCY DEPARTMENT AT [facility] Provider Note  MDM   HPI/ROS:  Nathan Santiago is a 89 y.o. male presenting from facility after mechanical fall while transitioning to his walker.  Patient is on Plavix and experienced head trauma.  Denies syncope prior to fall.  He did not lose consciousness or experience bleeding.  Upon presentation, patient is well-appearing with no outward signs of trauma.  Denying pain at this time.  Denies warfarin, Eliquis, Xarelto.  Physical exam is notable for: - Overall well-appearing, no acute distress - Airway intact, bilateral breath sounds present - Secondary survey without any obvious signs of trauma.  Scalp nontender to palpation, C-spine without midline tenderness.  On my initial evaluation, patient is:  -Vital signs stable. Patient afebrile, hemodynamically stable, and non-toxic appearing. -Additional history obtained from chart review  Given the patient's history and physical exam, differential diagnosis includes but is not limited to acute fractures, dislocations, internal bleeding, other traumatic injuries.  Interpretations, interventions, and the patient's course of care are documented below.    Upon initial evaluation, patient was placed in c-collar.  Trauma labs ordered, i-STAT Chem-8 ordered.  Disposition:  Care of this patient was assumed by oncoming team providers at time of signout at approximately 1500.  Plan at time of signout is to follow-up with additional trauma imaging, determine disposition.  Clinical Impression: No diagnosis found.  Rx / DC Orders ED Discharge Orders     None       The plan for this patient was discussed with Dr. Rosalia Hammers, who voiced agreement and who oversaw evaluation and treatment of this patient.   Clinical Complexity A medically appropriate history, review of systems, and physical exam was performed.  My independent interpretations of EKG, labs, and radiology are documented in the  ED course above.   Click here for ABCD2, HEART and other calculatorsREFRESH Note before signing   Patient's presentation is most consistent with acute presentation with potential threat to life or bodily function.  Medical Decision Making Amount and/or Complexity of Data Reviewed Radiology: ordered.  Risk Prescription drug management.    HPI/ROS      See MDM section for pertinent HPI and ROS. A complete ROS was performed with pertinent positives/negatives noted above.   Past Medical History:  Diagnosis Date   AAA (abdominal aortic aneurysm) (HCC)    asymptomatic   AAA (abdominal aortic aneurysm) (HCC) 2010   peripheral  angiogram-- bilateral SFA DISEASE  and 60 to 70% infrarenaal abd. aortic stenosis with 15 -mm gradient   Adenomatous colon polyp 11/2003   CAD (coronary artery disease)    Gait abnormality 02/13/2017   Gastroparesis    pt denies   GERD (gastroesophageal reflux disease)    w/ LPR   History of kidney stones    x2   Hyperlipidemia    Hypertension    IDA (iron deficiency anemia)    Peripheral arterial disease (HCC)    RCEA  by Dr [name]   PUD (peptic ulcer disease)    pt unaware   Vertigo     Past Surgical History:  Procedure Laterality Date   ABDOMINAL AORTOGRAM W/LOWER EXTREMITY Bilateral 08/05/2018   Procedure: ABDOMINAL AORTOGRAM W/LOWER EXTREMITY;  Surgeon: Runell Gess, MD;  Location: [facility] INVASIVE CV LAB;  Service: Cardiovascular;  Laterality: Bilateral;   ABDOMINAL AORTOGRAM W/LOWER EXTREMITY N/A 09/26/2021   Procedure: ABDOMINAL AORTOGRAM W/LOWER EXTREMITY;  Surgeon: Runell Gess, MD;  Location: [facility] IN[facility] CV [facility];  Service: Cardiovascular;  Laterality: N/A;   AORTOGRAM  04/24/2016    Abdominal aortogram, bilateral iliac angiogram, bifemoral runoff   CARDIAC CATHETERIZATION  05/12/2005   RCA   carotid doppler  01/24/2013   RICA endarterectomy,left CCA 0-49%; left bulb and prox ICA 50-69%; bilaateral subclavian < 50%   CAROTID  ENDARTERECTOMY  11/01/2010   CATARACT EXTRACTION     COLONOSCOPY     CORONARY ANGIOPLASTY  05/18/2005   2 STENTS distal RCA AND PROXIMAL-MID RCA   5 total stents per pt.   CYSTOSCOPY/URETEROSCOPY/HOLMIUM LASER/STENT PLACEMENT Left 01/11/2018   Procedure: LEFT URETEROSCOPY/HOLMIUM LASER/STENT PLACEMENT;  Surgeon: Crist Fat, MD;  Location: [facility];  Service: Urology;  Laterality: Left;   DOPPLER ECHOCARDIOGRAPHY  02/07/2012   EF 55%,SHOWED NO ISCHEMIA    HERNIA REPAIR     umbilical   lower arterial  doppler  02/04/2013   aotra 1.5 x 1.5 cm; distal abd aorta 70-99%,proximal common iliac arteries -very stenotic with increased velocities>50%,may be falsely elevated as a result of residual plaque from the distal aorta stenosis   lower extremity doppler  June 18 ,2013   ABI'S ABNORMAL, RABI was 0.88 and LABI 0.75 ,with 3-vessel  run off   NM MYOVIEW LTD  MAY 23,2011   showed no significant ischemia;   NM MYOVIEW LTD  04/22/2008   ef 77%,exercise capcity 6 METS ,exaggerated blood pressure response to exercise   PERIPHERAL VASCULAR CATHETERIZATION N/A 04/24/2016   Procedure: Lower Extremity Angiography;  Surgeon: Runell Gess, MD;  Location: [facility] INVASIVE CV LAB;  Service: Cardiovascular;  Laterality: N/A;   PERIPHERAL VASCULAR CATHETERIZATION  04/24/2016   Procedure: Peripheral Vascular Intervention;  Surgeon: Runell Gess, MD;  Location: [facility] INVASIVE CV LAB;  Service: Cardiovascular;;  Aorta   retrograde central aortic catheterization  05/19/2005   cutting balloon atherectomy, c-circ stenosis with DES STENTING CYPHER   TONSILLECTOMY     TOTAL HIP ARTHROPLASTY Right 07/11/2022   Procedure: TOTAL HIP ARTHROPLASTY ANTERIOR APPROACH;  Surgeon: Durene Romans, MD;  Location: [facility];  Service: Orthopedics;  Laterality: Right;   TOTAL KNEE ARTHROPLASTY Left 01/03/2019   Procedure: TOTAL KNEE ARTHROPLASTY;  Surgeon: Beverely Low, MD;  Location: [facility];  Service: Orthopedics;  Laterality: Left;   with IS block   TRANSURETHRAL RESECTION OF PROSTATE N/A 09/08/2016   Procedure: TRANSURETHRAL RESECTION OF THE PROSTATE (TURP);  Surgeon: Crist Fat, MD;  Location: [facility];  Service: Urology;  Laterality: N/A;   TRANSURETHRAL RESECTION OF PROSTATE     10-10-17  Dr. Marlou Porch   TRANSURETHRAL RESECTION OF PROSTATE N/A 10/10/2017   Procedure: TRANSURETHRAL RESECTION OF THE PROSTATE (TURP);  Surgeon: Crist Fat, MD;  Location: [facility];  Service: Urology;  Laterality: N/A;   VASECTOMY        Physical Exam   Vitals:   03/01/23 1446 03/01/23 1449 03/01/23 1515 03/01/23 1517  BP:   (!) 172/59   Pulse:   80   Resp:  18 16   Temp: 97.9 F (36.6 C)     SpO2:   91%   Weight:    77.1 kg  Height:    5\' 4"  (1.626 m)    Physical Exam Vitals and nursing note reviewed.  Constitutional:      General: He is not in acute distress.    Appearance: He is well-developed.  HENT:     Head: Normocephalic and atraumatic.  Eyes:     Conjunctiva/sclera: Conjunctivae normal.  Cardiovascular:     Rate and  Rhythm: Normal rate and regular rhythm.     Heart sounds: No murmur heard. Pulmonary:     Effort: Pulmonary effort is normal. No respiratory distress.     Breath sounds: Normal breath sounds.  Abdominal:     Palpations: Abdomen is soft.     Tenderness: There is no abdominal tenderness.  Musculoskeletal:        General: No swelling.     Cervical back: Neck supple. No tenderness.  Skin:    General: Skin is warm and dry.     Capillary Refill: Capillary refill takes less than 2 seconds.  Neurological:     Mental Status: He is alert.  Psychiatric:        Mood and Affect: Mood normal.    Starleen Arms, MD Department of Emergency Medicine   Please note that this documentation was produced with the assistance of voice-to-text technology and may contain errors.    Dyanne Iha, MD 03/01/23 1530    Margarita Grizzle, MD 03/05/23 1236

## 2023-03-01 NOTE — ED Notes (Signed)
Patient is A&Ox4, no s/s of any distress. Patient to move all extremities without any issues. Will continue to monitor

## 2023-03-01 NOTE — ED Notes (Signed)
Patient back from CT.

## 2023-03-02 ENCOUNTER — Encounter: Payer: Self-pay | Admitting: Adult Health

## 2023-03-02 NOTE — Progress Notes (Unsigned)
Location:  [facility] Room Number: 149A Place of Service:  SNF [phone]) Provider:  Fletcher Anon, NP  Mahlon Gammon, MD  Patient Care Team: Mahlon Gammon, MD as PCP - General (Internal Medicine) Runell Gess, MD as PCP - Cardiology (Cardiology)  Extended Emergency Contact Information Primary Emergency Contact: [name] Address: [address]          [place], [place] [address] [place] of [place] Home Phone: [phone] Mobile Phone: [phone] Relation: Spouse  Code Status:  DNR Goals of care: Advanced Directive information    03/02/2023   10:51 AM  Advanced Directives  Does Patient Have a Medical Advance Directive? Yes  Type of Advance Directive Living will;Out of facility DNR (pink MOST or yellow form)  Does patient want to make changes to medical advance directive? No - Patient declined  Copy of Healthcare Power of Attorney in Chart? Yes - validated most recent copy scanned in chart (See row information)  Pre-existing out of facility DNR order (yellow form or pink MOST form) Yellow form placed in chart (order not valid for inpatient use)     Chief Complaint  Patient presents with  . Medical Management of Chronic Issues    Patient is being seen for routine visit   . Immunizations    Patient is due for flu vaccine     HPI:  Pt is a 89 y.o. male seen today for medical management of chronic diseases.     Past Medical History:  Diagnosis Date  . AAA (abdominal aortic aneurysm) (HCC)    asymptomatic  . AAA (abdominal aortic aneurysm) (HCC) 2010   peripheral  angiogram-- bilateral SFA DISEASE  and 60 to 70% infrarenaal abd. aortic stenosis with 15 -mm gradient  . Adenomatous colon polyp 11/2003  . CAD (coronary artery disease)   . Gait abnormality 02/13/2017  . Gastroparesis    pt denies  . GERD (gastroesophageal reflux disease)    w/ LPR  . History of kidney stones    x2  . Hyperlipidemia   .  Hypertension   . IDA (iron deficiency anemia)   . Peripheral arterial disease (HCC)    RCEA  by Dr [name]  . PUD (peptic ulcer disease)    pt unaware  . Vertigo    Past Surgical History:  Procedure Laterality Date  . ABDOMINAL AORTOGRAM W/LOWER EXTREMITY Bilateral 08/05/2018   Procedure: ABDOMINAL AORTOGRAM W/LOWER EXTREMITY;  Surgeon: Runell Gess, MD;  Location: [facility] INVASIVE CV LAB;  Service: Cardiovascular;  Laterality: Bilateral;  . ABDOMINAL AORTOGRAM W/LOWER EXTREMITY N/A 09/26/2021   Procedure: ABDOMINAL AORTOGRAM W/LOWER EXTREMITY;  Surgeon: Runell Gess, MD;  Location: [facility] INVASIVE CV LAB;  Service: Cardiovascular;  Laterality: N/A;  . AORTOGRAM  04/24/2016    Abdominal aortogram, bilateral iliac angiogram, bifemoral runoff  . CARDIAC CATHETERIZATION  05/12/2005   RCA  . carotid doppler  01/24/2013   RICA endarterectomy,left CCA 0-49%; left bulb and prox ICA 50-69%; bilaateral subclavian < 50%  . CAROTID ENDARTERECTOMY  11/01/2010  . CATARACT EXTRACTION    . COLONOSCOPY    . CORONARY ANGIOPLASTY  05/18/2005   2 STENTS distal RCA AND PROXIMAL-MID RCA   5 total stents per pt.  . CYSTOSCOPY/URETEROSCOPY/HOLMIUM LASER/STENT PLACEMENT Left 01/11/2018   Procedure: LEFT URETEROSCOPY/HOLMIUM LASER/STENT PLACEMENT;  Surgeon: Crist Fat, MD;  Location: [facility] ORS;  Service: Urology;  Laterality: Left;  . DOPPLER ECHOCARDIOGRAPHY  02/07/2012   EF 55%,SHOWED NO ISCHEMIA   .  HERNIA REPAIR     umbilical  . lower arterial  doppler  02/04/2013   aotra 1.5 x 1.5 cm; distal abd aorta 70-99%,proximal common iliac arteries -very stenotic with increased velocities>50%,may be falsely elevated as a result of residual plaque from the distal aorta stenosis  . lower extremity doppler  June 18 ,2013   ABI'S ABNORMAL, RABI was 0.88 and LABI 0.75 ,with 3-vessel  run off  . NM MYOVIEW LTD  MAY [id number]   showed no significant ischemia;  . NM MYOVIEW LTD  04/22/2008   ef 77%,exercise  capcity 6 METS ,exaggerated blood pressure response to exercise  . PERIPHERAL VASCULAR CATHETERIZATION N/A 04/24/2016   Procedure: Lower Extremity Angiography;  Surgeon: Runell Gess, MD;  Location: [facility] INVASIVE CV LAB;  Service: Cardiovascular;  Laterality: N/A;  . PERIPHERAL VASCULAR CATHETERIZATION  04/24/2016   Procedure: Peripheral Vascular Intervention;  Surgeon: Runell Gess, MD;  Location: [facility] INVASIVE CV LAB;  Service: Cardiovascular;;  Aorta  . retrograde central aortic catheterization  05/19/2005   cutting balloon atherectomy, c-circ stenosis with DES STENTING CYPHER  . TONSILLECTOMY    . TOTAL HIP ARTHROPLASTY Right 07/11/2022   Procedure: TOTAL HIP ARTHROPLASTY ANTERIOR APPROACH;  Surgeon: Durene Romans, MD;  Location: [facility];  Service: Orthopedics;  Laterality: Right;  . TOTAL KNEE ARTHROPLASTY Left 01/03/2019   Procedure: TOTAL KNEE ARTHROPLASTY;  Surgeon: Beverely Low, MD;  Location: [facility];  Service: Orthopedics;  Laterality: Left;  with IS block  . TRANSURETHRAL RESECTION OF PROSTATE N/A 09/08/2016   Procedure: TRANSURETHRAL RESECTION OF THE PROSTATE (TURP);  Surgeon: Crist Fat, MD;  Location: [facility];  Service: Urology;  Laterality: N/A;  . TRANSURETHRAL RESECTION OF PROSTATE     10-10-17  Dr. Marlou Porch  . TRANSURETHRAL RESECTION OF PROSTATE N/A 10/10/2017   Procedure: TRANSURETHRAL RESECTION OF THE PROSTATE (TURP);  Surgeon: Crist Fat, MD;  Location: [facility];  Service: Urology;  Laterality: N/A;  . VASECTOMY      Allergies  Allergen Reactions  . Lisinopril Cough  . Niaspan [Niacin Er] Rash    Outpatient Encounter Medications as of 03/02/2023  Medication Sig  . acetaminophen (TYLENOL) 500 MG tablet Take 1 tablet (500 mg total) by mouth 2 (two) times daily as needed.  [name] amLODipine (NORVASC) 10 MG tablet Take 10 mg by mouth daily.  [name] Apoaequorin (PREVAGEN PO) Take 1 tablet by mouth daily.  [name] atenolol (TENORMIN) 100 MG tablet Take 100 mg by mouth daily.  [name]  atorvastatin (LIPITOR) 40 MG tablet Take 40 mg by mouth daily at 6 PM.   . betamethasone dipropionate (DIPROLENE) 0.05 % ointment Apply topically 2 (two) times daily as needed.  . Biotin 10 MG TABS Take 10 mg by mouth daily in the afternoon.  . Camphor-Menthol-Methyl Sal (SALONPAS) 3.08-05-08 % PTCH Apply 1 patch topically as needed. Special Instructions: Salonpas patch (on in am, off in pm) as needed to affected area  . Carboxymethylcellulose Sodium (ARTIFICIAL TEARS OP) Apply 1 drop to eye 4 (four) times daily.  . cetirizine (ZYRTEC) 10 MG tablet Take 1 tablet (10 mg total) by mouth at bedtime.  . cholecalciferol (VITAMIN D3) 25 MCG (1000 UT) tablet Take 1,000 Units by mouth daily.  . clopidogrel (PLAVIX) 75 MG tablet Take 75 mg by mouth every evening.   . Dentifrices (BIOTENE DRY MOUTH CARE DT) Place 2 sprays onto teeth 4 (four) times daily as needed.  . finasteride (PROSCAR) 5 MG tablet Take 5 mg by mouth daily.  [name]  gabapentin (NEURONTIN) 100 MG capsule Take 100 mg by mouth 3 (three) times daily.  . Glucosamine-Chondroitin 750-600 MG TABS Take 1 tablet by mouth 2 (two) times daily.  . hydrALAZINE (APRESOLINE) 25 MG tablet Take 25 mg by mouth every 8 (eight) hours as needed.  . hydrALAZINE (APRESOLINE) 50 MG tablet Take 1 tablet (50 mg total) by mouth 4 (four) times daily.  . hydrochlorothiazide (MICROZIDE) 12.5 MG capsule Take 2 capsules (25 mg total) by mouth daily.  [name] HYDROcodone bit-homatropine (HYCODAN) 5-1.5 MG/5ML syrup Take 5 mLs by mouth every 6 (six) hours as needed for cough.  . iron polysaccharides (NIFEREX) 150 MG capsule Take 150 mg by mouth every Monday, Wednesday, and Friday.  . losartan (COZAAR) 50 MG tablet Take 50 mg by mouth 2 (two) times daily.  . melatonin 5 MG TABS Take 1 tablet (5 mg total) by mouth at bedtime.  . Misc Natural Products (GLUCOSAMINE CHONDROITIN TRIPLE) TABS Take 1 tablet by mouth 2 (two) times daily.  . Multiple Vitamin (MULTIVITAMIN WITH MINERALS) TABS  tablet Take 1 tablet by mouth daily.  . ondansetron (ZOFRAN) 4 MG tablet Take 4 mg by mouth every 8 (eight) hours as needed for nausea or vomiting.  . pantoprazole (PROTONIX) 40 MG tablet Take 40 mg by mouth 2 (two) times daily.  . potassium chloride SA (K-DUR,KLOR-CON) 20 MEQ tablet Take 20 mEq by mouth every evening.   . tamsulosin (FLOMAX) 0.4 MG CAPS capsule Take 1 capsule (0.4 mg total) by mouth daily after supper.   No facility-administered encounter medications on file as of 03/02/2023.    Review of Systems  Immunization History  Administered Date(s) Administered  . Fluad Quad(high Dose 65+) 05/02/2022  . Influenza, High Dose Seasonal PF 03/31/2017, 03/29/2018  . Influenza,inj,Quad PF,6+ Mos 04/25/2016, 05/06/2018  . Influenza,inj,quad, With Preservative 05/26/2019  . Influenza-Unspecified 05/14/2001, 05/31/2001, 05/14/2002, 05/15/2002, 05/18/2003, 05/25/2004, 04/30/2005, 05/29/2006, 05/01/2007, 05/11/2008, 05/31/2009, 05/31/2010, 04/30/2013, 05/31/2014, 03/31/2021  . Moderna Covid-19 Vaccine Bivalent Booster 57yrs & up 08/31/2021  . Moderna SARS-COV2 Booster Vaccination 06/15/2020, 11/19/2020, 01/02/2022  . Moderna Sars-Covid-2 Vaccination 08/12/2019, 09/10/2019, 05/31/2022  . [employer], [employer] 73yrs & up 11/23/2022  . Pneumococcal Polysaccharide-23 05/02/2000, 09/24/2018  . Rsv, Bivalent, Protein Subunit Rsvpref,pf Verdis Frederickson) 07/19/2022  . Td 09/08/2013  . Zoster Recombinant(Shingrix) 10/04/2017, 01/23/2018   Pertinent  Health Maintenance Due  Topic Date Due  . INFLUENZA VACCINE  03/01/2023      07/12/2022    7:10 PM 07/13/2022    7:10 AM 07/28/2022    9:00 AM 10/14/2022    7:21 AM 11/28/2022    2:50 PM  Fall Risk  Falls in the past year?    1 1  Was there an injury with Fall?    1 0  Fall Risk Category Calculator    3 1  (RETIRED) Patient Fall Risk Level High fall risk High fall risk High fall risk    Patient at Risk for Falls Due to     History  of fall(s);Impaired balance/gait;Impaired mobility  Fall risk Follow up    Falls evaluation completed Falls evaluation completed;Education provided;Falls prevention discussed   Functional Status Survey:    Vitals:   03/02/23 1041  BP: (!) 154/59  Pulse: 65  Resp: 17  Temp: 97.9 F (36.6 C)  TempSrc: Temporal  SpO2: 97%  Weight: 174 lb (78.9 kg)  Height: 5\' 4"  (1.626 m)   Body mass index is 29.87 kg/m. Physical Exam  Labs reviewed: Recent Labs  07/11/22 0334 07/12/22 0329 07/12/22 0330 07/13/22 0335 07/20/22 0000 07/27/22 0000 10/17/22 0000 03/01/23 1450  NA 142 143 143 145 141 140 142 142  K 3.8 4.6 4.6 4.0 4.2 3.9 4.0 4.2  CL 108 110 110 115* 107 103 105 107  CO2 26 25 24 25  23* 23* 26*  --   GLUCOSE 129* 179* 181* 186*  --   --   --  109*  BUN 16 26* 24* 33* 33* 30* 21 20  CREATININE 0.84 1.32* 1.34* 1.04 1.0 1.5* 1.0 1.10  CALCIUM 8.7* 8.7* 8.7* 8.8* 9.0 9.4 9.6  --   MG 2.1 2.4  --  2.2  --   --   --   --   PHOS 3.3  --  3.4 2.8  --   --   --   --    Recent Labs    05/13/22 1510 05/13/22 2334 05/14/22 0452 07/11/22 0334 07/12/22 0330 07/13/22 0335 10/17/22 0000  AST 22 17 19   --   --   --  16  ALT 21 22 21   --   --   --  15  ALKPHOS 70 66 64  --   --   --  111  BILITOT 0.7 0.9 0.8  --   --   --   --   PROT 6.7 6.3* 6.1*  --   --   --   --   ALBUMIN 3.5 3.3* 3.2*   < > 2.7* 2.9* 4.1   < > = values in this interval not displayed.   Recent Labs    05/15/22 0613 05/16/22 0110 07/09/22 1450 07/11/22 0334 07/12/22 0329 07/13/22 0335 07/20/22 0000 07/27/22 0000 10/17/22 0000 03/01/23 1450  WBC 7.3 8.7 9.0 10.2 14.4* 16.0* 11.0 15.5 7.6  --   NEUTROABS 5.0 6.4 7.3  --   --   --   --   --   --   --   HGB 13.5 12.6* 12.6* 12.2* 10.5* 9.6* 10.4* 10.5* 12.6* 12.6*  HCT 40.6 38.4* 38.5* 37.6* 32.6* 30.0* 32* 33* 36* 37.0*  MCV 83.2 83.3 87.1 88.3 89.6 89.3  --   --   --   --   PLT 195 189 257 202 201 205 258 296 217  --    Lab Results   Component Value Date   TSH 1.104 05/13/2022   Lab Results  Component Value Date   HGBA1C 5.5 11/23/2016   Lab Results  Component Value Date   CHOL 133 10/17/2022   HDL 49 10/17/2022   LDLCALC 65 10/17/2022   TRIG 122 10/17/2022   CHOLHDL 2.4 11/23/2016    Significant Diagnostic Results in last 30 days:  CT Head Wo Contrast  Addendum Date: 03/01/2023   ADDENDUM REPORT: 03/01/2023 16:21 ADDENDUM: Findings discussed by telephone with Dr. Rosalia Hammers at 3:50 p.m., 03/01/2023. Electronically Signed   By: Jearld Lesch M.D.   On: 03/01/2023 16:21   Result Date: 03/01/2023 CLINICAL DATA:  Fall EXAM: CT HEAD WITHOUT CONTRAST CT CERVICAL SPINE WITHOUT CONTRAST TECHNIQUE: Multidetector CT imaging of the head and cervical spine was performed following the standard protocol without intravenous contrast. Multiplanar CT image reconstructions of the cervical spine were also generated. RADIATION DOSE REDUCTION: This exam was performed according to the departmental dose-optimization program which includes automated exposure control, adjustment of the mA and/or kV according to patient size and/or use of iterative reconstruction technique. COMPARISON:  05/09/2022 FINDINGS: CT HEAD FINDINGS Brain: No evidence of acute  infarction, hemorrhage, hydrocephalus, extra-axial collection or mass lesion/mass effect. Extensive periventricular and deep white matter hypodensity. Vascular: No hyperdense vessel or unexpected calcification. Skull: Normal. Negative for fracture or focal lesion. Sinuses/Orbits: No acute finding. Other: None. CT CERVICAL SPINE FINDINGS Alignment: Degenerative and positional straightening of the normal cervical lordosis. Skull base and vertebrae: Minimally displaced, unilateral fracture of the base of the left lamina of C1 (series 8, image 17). No other fracture appreciated. No primary bone lesion or focal pathologic process. Soft tissues and spinal canal: No prevertebral fluid or swelling. No visible canal  hematoma. Disc levels: Focally severe disc space height loss and osteophytosis of C5-C6 with otherwise mild disc degenerative change throughout the cervical spine. Upper chest: Negative. Other: None. IMPRESSION: 1. No acute intracranial pathology. Small-vessel white matter disease, in keeping with advanced patient age. 2. Minimally displaced, unilateral fracture of the base of the left lamina of C1. No other cervical fracture. 3. Focally severe disc space height loss and osteophytosis of C5-C6 with otherwise mild disc degenerative change throughout the cervical spine. Call report request placed at the time of interpretation. Report issued at this time in the interest of expediency. Final communication will be documented. Electronically Signed: By: Jearld Lesch M.D. On: 03/01/2023 15:46   CT Cervical Spine Wo Contrast  Addendum Date: 03/01/2023   ADDENDUM REPORT: 03/01/2023 16:21 ADDENDUM: Findings discussed by telephone with Dr. Rosalia Hammers at 3:50 p.m., 03/01/2023. Electronically Signed   By: Jearld Lesch M.D.   On: 03/01/2023 16:21   Result Date: 03/01/2023 CLINICAL DATA:  Fall EXAM: CT HEAD WITHOUT CONTRAST CT CERVICAL SPINE WITHOUT CONTRAST TECHNIQUE: Multidetector CT imaging of the head and cervical spine was performed following the standard protocol without intravenous contrast. Multiplanar CT image reconstructions of the cervical spine were also generated. RADIATION DOSE REDUCTION: This exam was performed according to the departmental dose-optimization program which includes automated exposure control, adjustment of the mA and/or kV according to patient size and/or use of iterative reconstruction technique. COMPARISON:  05/09/2022 FINDINGS: CT HEAD FINDINGS Brain: No evidence of acute infarction, hemorrhage, hydrocephalus, extra-axial collection or mass lesion/mass effect. Extensive periventricular and deep white matter hypodensity. Vascular: No hyperdense vessel or unexpected calcification. Skull: Normal.  Negative for fracture or focal lesion. Sinuses/Orbits: No acute finding. Other: None. CT CERVICAL SPINE FINDINGS Alignment: Degenerative and positional straightening of the normal cervical lordosis. Skull base and vertebrae: Minimally displaced, unilateral fracture of the base of the left lamina of C1 (series 8, image 17). No other fracture appreciated. No primary bone lesion or focal pathologic process. Soft tissues and spinal canal: No prevertebral fluid or swelling. No visible canal hematoma. Disc levels: Focally severe disc space height loss and osteophytosis of C5-C6 with otherwise mild disc degenerative change throughout the cervical spine. Upper chest: Negative. Other: None. IMPRESSION: 1. No acute intracranial pathology. Small-vessel white matter disease, in keeping with advanced patient age. 2. Minimally displaced, unilateral fracture of the base of the left lamina of C1. No other cervical fracture. 3. Focally severe disc space height loss and osteophytosis of C5-C6 with otherwise mild disc degenerative change throughout the cervical spine. Call report request placed at the time of interpretation. Report issued at this time in the interest of expediency. Final communication will be documented. Electronically Signed: By: Jearld Lesch M.D. On: 03/01/2023 15:46   CT CHEST ABDOMEN PELVIS W CONTRAST  Result Date: 03/01/2023 CLINICAL DATA:  Fall EXAM: CT CHEST, ABDOMEN, AND PELVIS WITH CONTRAST CT THORACIC AND LUMBAR SPINE WITH CONTRAST TECHNIQUE:  Multidetector CT imaging of the chest, abdomen and pelvis was performed following the standard protocol during bolus administration of intravenous contrast. Multidetector CT imaging of the thoracic and lumbar spine was performed following the standard protocol during bolus administration of intravenous contrast. RADIATION DOSE REDUCTION: This exam was performed according to the departmental dose-optimization program which includes automated exposure control,  adjustment of the mA and/or kV according to patient size and/or use of iterative reconstruction technique. CONTRAST:  75mL OMNIPAQUE IOHEXOL 350 MG/ML SOLN COMPARISON:  CT abdomen pelvis, 12/26/2017 FINDINGS: CT CHEST FINDINGS Cardiovascular: Aortic atherosclerosis. Cardiomegaly. Three-vessel coronary artery calcifications and or stents. No pericardial effusion. Mediastinum/Nodes: No enlarged mediastinal, hilar, or axillary lymph nodes. Small calcified mediastinal and bilateral hilar lymph nodes, consistent with sequelae of prior granulomatous infection thyroid gland, trachea, and esophagus demonstrate no significant findings. Lungs/Pleura: Small, calcified benign granulomatous nodules, requiring no further follow-up or characterization. No pleural effusion or pneumothorax. Musculoskeletal: No chest wall mass or suspicious osseous lesions identified. CT ABDOMEN PELVIS FINDINGS Hepatobiliary: No solid liver abnormality is seen. Simple, benign liver cysts, for which no further follow-up or characterization is required. No gallstones, gallbladder wall thickening, or biliary dilatation. Pancreas: Unremarkable. No pancreatic ductal dilatation or surrounding inflammatory changes. Spleen: Normal in size without significant abnormality. Benign granulomatous calcifications throughout the spleen. Adrenals/Urinary Tract: Definitively benign, fat containing adenomatous thickening of the bilateral adrenal glands, for which no further follow-up or characterization is required. Simple, benign bilateral renal cortical cysts, for which no further follow-up or characterization is required. Scarred, atrophic left renal cortex. No calculi or hydronephrosis. Right kidney is normal, without renal calculi, solid lesion, or hydronephrosis. Bladder is unremarkable. Stomach/Bowel: Stomach is within normal limits. Appendix appears normal. No evidence of bowel wall thickening, distention, or inflammatory changes. Sigmoid diverticulosis.  Vascular/Lymphatic: Severe aortic atherosclerosis. Stent of the infrarenal abdominal aorta. No enlarged abdominal or pelvic lymph nodes. Reproductive: No mass or other abnormality. Other: Small fat containing left inguinal hernia.  No ascites. Musculoskeletal: No acute osseous findings. Status post right hip total arthroplasty. CT THORACIC AND LUMBAR SPINE FINDINGS Alignment: Normal thoracic kyphosis. Normal lumbar lordosis. Vertebral bodies: Unchanged, nonacute minimal wedging of L1 (series 9, image 55). No acute fracture or dislocation. Disc spaces: Intact. Paraspinous soft tissues: Unremarkable. IMPRESSION: 1. No acute CT findings of the chest, abdomen, or pelvis. 2. Unchanged, nonacute minimal wedging of L1. No acute fracture or dislocation of the thoracic or lumbar spine. 3. Cardiomegaly and coronary artery disease. 4. Sigmoid diverticulosis without evidence of acute diverticulitis. Aortic Atherosclerosis (ICD10-I70.0). Electronically Signed   By: Jearld Lesch M.D.   On: 03/01/2023 16:09   CT L-SPINE NO CHARGE  Result Date: 03/01/2023 CLINICAL DATA:  Fall EXAM: CT CHEST, ABDOMEN, AND PELVIS WITH CONTRAST CT THORACIC AND LUMBAR SPINE WITH CONTRAST TECHNIQUE: Multidetector CT imaging of the chest, abdomen and pelvis was performed following the standard protocol during bolus administration of intravenous contrast. Multidetector CT imaging of the thoracic and lumbar spine was performed following the standard protocol during bolus administration of intravenous contrast. RADIATION DOSE REDUCTION: This exam was performed according to the departmental dose-optimization program which includes automated exposure control, adjustment of the mA and/or kV according to patient size and/or use of iterative reconstruction technique. CONTRAST:  75mL OMNIPAQUE IOHEXOL 350 MG/ML SOLN COMPARISON:  CT abdomen pelvis, 12/26/2017 FINDINGS: CT CHEST FINDINGS Cardiovascular: Aortic atherosclerosis. Cardiomegaly. Three-vessel  coronary artery calcifications and or stents. No pericardial effusion. Mediastinum/Nodes: No enlarged mediastinal, hilar, or axillary lymph nodes. Small calcified mediastinal and  bilateral hilar lymph nodes, consistent with sequelae of prior granulomatous infection thyroid gland, trachea, and esophagus demonstrate no significant findings. Lungs/Pleura: Small, calcified benign granulomatous nodules, requiring no further follow-up or characterization. No pleural effusion or pneumothorax. Musculoskeletal: No chest wall mass or suspicious osseous lesions identified. CT ABDOMEN PELVIS FINDINGS Hepatobiliary: No solid liver abnormality is seen. Simple, benign liver cysts, for which no further follow-up or characterization is required. No gallstones, gallbladder wall thickening, or biliary dilatation. Pancreas: Unremarkable. No pancreatic ductal dilatation or surrounding inflammatory changes. Spleen: Normal in size without significant abnormality. Benign granulomatous calcifications throughout the spleen. Adrenals/Urinary Tract: Definitively benign, fat containing adenomatous thickening of the bilateral adrenal glands, for which no further follow-up or characterization is required. Simple, benign bilateral renal cortical cysts, for which no further follow-up or characterization is required. Scarred, atrophic left renal cortex. No calculi or hydronephrosis. Right kidney is normal, without renal calculi, solid lesion, or hydronephrosis. Bladder is unremarkable. Stomach/Bowel: Stomach is within normal limits. Appendix appears normal. No evidence of bowel wall thickening, distention, or inflammatory changes. Sigmoid diverticulosis. Vascular/Lymphatic: Severe aortic atherosclerosis. Stent of the infrarenal abdominal aorta. No enlarged abdominal or pelvic lymph nodes. Reproductive: No mass or other abnormality. Other: Small fat containing left inguinal hernia.  No ascites. Musculoskeletal: No acute osseous findings. Status post  right hip total arthroplasty. CT THORACIC AND LUMBAR SPINE FINDINGS Alignment: Normal thoracic kyphosis. Normal lumbar lordosis. Vertebral bodies: Unchanged, nonacute minimal wedging of L1 (series 9, image 55). No acute fracture or dislocation. Disc spaces: Intact. Paraspinous soft tissues: Unremarkable. IMPRESSION: 1. No acute CT findings of the chest, abdomen, or pelvis. 2. Unchanged, nonacute minimal wedging of L1. No acute fracture or dislocation of the thoracic or lumbar spine. 3. Cardiomegaly and coronary artery disease. 4. Sigmoid diverticulosis without evidence of acute diverticulitis. Aortic Atherosclerosis (ICD10-I70.0). Electronically Signed   By: Jearld Lesch M.D.   On: 03/01/2023 16:09   CT T-SPINE NO CHARGE  Result Date: 03/01/2023 CLINICAL DATA:  Fall EXAM: CT CHEST, ABDOMEN, AND PELVIS WITH CONTRAST CT THORACIC AND LUMBAR SPINE WITH CONTRAST TECHNIQUE: Multidetector CT imaging of the chest, abdomen and pelvis was performed following the standard protocol during bolus administration of intravenous contrast. Multidetector CT imaging of the thoracic and lumbar spine was performed following the standard protocol during bolus administration of intravenous contrast. RADIATION DOSE REDUCTION: This exam was performed according to the departmental dose-optimization program which includes automated exposure control, adjustment of the mA and/or kV according to patient size and/or use of iterative reconstruction technique. CONTRAST:  75mL OMNIPAQUE IOHEXOL 350 MG/ML SOLN COMPARISON:  CT abdomen pelvis, 12/26/2017 FINDINGS: CT CHEST FINDINGS Cardiovascular: Aortic atherosclerosis. Cardiomegaly. Three-vessel coronary artery calcifications and or stents. No pericardial effusion. Mediastinum/Nodes: No enlarged mediastinal, hilar, or axillary lymph nodes. Small calcified mediastinal and bilateral hilar lymph nodes, consistent with sequelae of prior granulomatous infection thyroid gland, trachea, and esophagus  demonstrate no significant findings. Lungs/Pleura: Small, calcified benign granulomatous nodules, requiring no further follow-up or characterization. No pleural effusion or pneumothorax. Musculoskeletal: No chest wall mass or suspicious osseous lesions identified. CT ABDOMEN PELVIS FINDINGS Hepatobiliary: No solid liver abnormality is seen. Simple, benign liver cysts, for which no further follow-up or characterization is required. No gallstones, gallbladder wall thickening, or biliary dilatation. Pancreas: Unremarkable. No pancreatic ductal dilatation or surrounding inflammatory changes. Spleen: Normal in size without significant abnormality. Benign granulomatous calcifications throughout the spleen. Adrenals/Urinary Tract: Definitively benign, fat containing adenomatous thickening of the bilateral adrenal glands, for which no further follow-up or characterization  is required. Simple, benign bilateral renal cortical cysts, for which no further follow-up or characterization is required. Scarred, atrophic left renal cortex. No calculi or hydronephrosis. Right kidney is normal, without renal calculi, solid lesion, or hydronephrosis. Bladder is unremarkable. Stomach/Bowel: Stomach is within normal limits. Appendix appears normal. No evidence of bowel wall thickening, distention, or inflammatory changes. Sigmoid diverticulosis. Vascular/Lymphatic: Severe aortic atherosclerosis. Stent of the infrarenal abdominal aorta. No enlarged abdominal or pelvic lymph nodes. Reproductive: No mass or other abnormality. Other: Small fat containing left inguinal hernia.  No ascites. Musculoskeletal: No acute osseous findings. Status post right hip total arthroplasty. CT THORACIC AND LUMBAR SPINE FINDINGS Alignment: Normal thoracic kyphosis. Normal lumbar lordosis. Vertebral bodies: Unchanged, nonacute minimal wedging of L1 (series 9, image 55). No acute fracture or dislocation. Disc spaces: Intact. Paraspinous soft tissues:  Unremarkable. IMPRESSION: 1. No acute CT findings of the chest, abdomen, or pelvis. 2. Unchanged, nonacute minimal wedging of L1. No acute fracture or dislocation of the thoracic or lumbar spine. 3. Cardiomegaly and coronary artery disease. 4. Sigmoid diverticulosis without evidence of acute diverticulitis. Aortic Atherosclerosis (ICD10-I70.0). Electronically Signed   By: Jearld Lesch M.D.   On: 03/01/2023 16:09   DG Pelvis Portable  Result Date: 03/01/2023 CLINICAL DATA:  fall EXAM: PORTABLE PELVIS 1-2 VIEWS COMPARISON:  07/11/2022. FINDINGS: Pelvis is intact with normal and symmetric sacroiliac joints. No acute fracture or dislocation. No aggressive osseous lesion. Visualized sacral arcuate lines are unremarkable. Unremarkable symphysis pubis. There are mild degenerative changes of the left hip joint with mild-to-moderate joint space narrowing and minimal osteophyte formation. No radiopaque foreign bodies. IMPRESSION: 1. Mild left hip osteoarthritis. Electronically Signed   By: Jules Schick M.D.   On: 03/01/2023 15:16   DG Chest Portable 1 View  Result Date: 03/01/2023 CLINICAL DATA:  fall EXAM: PORTABLE CHEST 1 VIEW COMPARISON:  07/09/2022. FINDINGS: Bilateral lung fields are clear. Bilateral costophrenic angles are clear. Stable cardio-mediastinal silhouette. Aortic arch calcifications noted. No acute osseous abnormalities. No acute displaced rib fracture seen. The soft tissues are within normal limits. IMPRESSION: No active disease. Electronically Signed   By: Jules Schick M.D.   On: 03/01/2023 15:14    Assessment/Plan There are no diagnoses linked to this encounter.       This encounter was created in error - please disregard.

## 2023-03-08 ENCOUNTER — Other Ambulatory Visit: Payer: Self-pay | Admitting: Adult Health

## 2023-03-08 ENCOUNTER — Encounter: Payer: Self-pay | Admitting: Adult Health

## 2023-03-08 ENCOUNTER — Non-Acute Institutional Stay (SKILLED_NURSING_FACILITY): Payer: Medicare Other | Admitting: Adult Health

## 2023-03-08 DIAGNOSIS — I10 Essential (primary) hypertension: Secondary | ICD-10-CM

## 2023-03-08 DIAGNOSIS — R053 Chronic cough: Secondary | ICD-10-CM

## 2023-03-08 DIAGNOSIS — E782 Mixed hyperlipidemia: Secondary | ICD-10-CM

## 2023-03-08 DIAGNOSIS — D509 Iron deficiency anemia, unspecified: Secondary | ICD-10-CM

## 2023-03-08 DIAGNOSIS — K279 Peptic ulcer, site unspecified, unspecified as acute or chronic, without hemorrhage or perforation: Secondary | ICD-10-CM

## 2023-03-08 DIAGNOSIS — S12090A Other displaced fracture of first cervical vertebra, initial encounter for closed fracture: Secondary | ICD-10-CM

## 2023-03-08 MED ORDER — HYDROCODONE BIT-HOMATROP MBR 5-1.5 MG/5ML PO SOLN
5.0000 mL | Freq: Every evening | ORAL | 0 refills | Status: AC | PRN
Start: 2023-03-08 — End: ?

## 2023-03-08 NOTE — Progress Notes (Signed)
Location: [facility].   Provider:  Peggye Ley, ANP [facility] [phone]  Code Status: DNR Goals of Care:     03/02/2023   10:51 AM  Advanced Directives  Does Patient Have a Medical Advance Directive? Yes  Type of Advance Directive Living will;Out of facility DNR (pink MOST or yellow form)  Does patient want to make changes to medical advance directive? No - Patient declined  Copy of Healthcare Power of Attorney in Chart? Yes - validated most recent copy scanned in chart (See row information)  Pre-existing out of facility DNR order (yellow form or pink MOST form) Yellow form placed in chart (order not valid for inpatient use)     Chief Complaint  Patient presents with   Medical Management of Chronic Issues    HPI: Patient is a 89 y.o. male seen today for medical management of chronic diseases.    PMH of CAD, PAD followed with Dr Allyson Sabal IDA, GERD, HTN, PVD and HLD He had a mechanical fall and subsequent severe right hip pain and inability to bear weight, and impacted subcapital femoral neck fracture. He underwent right THA by Dr. Charlann Boxer on 07/11/2022.  He walks with a walker short distances and uses a scooter for long distances. Needs assistance with bathing, dressing. Able to feed himself. Very pleasant. Incontinent intermittently.   Update 03/08/23: Seen in the ED 03/01/23 after a mechanical fall in his wife's apt. He struck his head on plavix. CT of the neck showed a C1 fracture of the left lamina. He is following up with neurosurgery. Currently wearing a miami collar. Denies any pain or numbness or tingling.   He has a hx of urinary retention but reports being able to urinate ok for now. Hx of hydronephrosis and stent.   Has lumbar stenosis and hx of leg weakness with peripheral neuropathy  BP running 150-170 systolic.  Irbesartan was tried bu the could not tolerate it, was having dizziness. No edema and no sob   MMSE 27/30 09/2022  Chronic cough on hycodan,  hx of cough due to gerd. No lung disease noted per note by pulmonary 2019. ON PPI.   Hx of AAA ICA stenosis and PAD, followed by Dr Allyson Sabal. Last carotid US showing bilateral ICA stenosis with progression. Repeat ordered 12 months.  Renal artery stenosis left 90%. Also has femoral artery stenosis.  Currently denies claudication Past Medical History:  Diagnosis Date   AAA (abdominal aortic aneurysm) (HCC)    asymptomatic   AAA (abdominal aortic aneurysm) (HCC) 2010   peripheral  angiogram-- bilateral SFA DISEASE  and 60 to 70% infrarenaal abd. aortic stenosis with 15 -mm gradient   Adenomatous colon polyp 11/2003   CAD (coronary artery disease)    Gait abnormality 02/13/2017   Gastroparesis    pt denies   GERD (gastroesophageal reflux disease)    w/ LPR   History of kidney stones    x2   Hyperlipidemia    Hypertension    IDA (iron deficiency anemia)    Peripheral arterial disease (HCC)    RCEA  by Dr [name]   PUD (peptic ulcer disease)    pt unaware   Vertigo     Past Surgical History:  Procedure Laterality Date   ABDOMINAL AORTOGRAM W/LOWER EXTREMITY Bilateral 08/05/2018   Procedure: ABDOMINAL AORTOGRAM W/LOWER EXTREMITY;  Surgeon: Runell Gess, MD;  Location: [facility] INVASIVE CV LAB;  Service: Cardiovascular;  Laterality: Bilateral;   ABDOMINAL AORTOGRAM W/LOWER EXTREMITY N/A 09/26/2021  Procedure: ABDOMINAL AORTOGRAM W/LOWER EXTREMITY;  Surgeon: Runell Gess, MD;  Location: [facility] INVASIVE CV LAB;  Service: Cardiovascular;  Laterality: N/A;   AORTOGRAM  04/24/2016    Abdominal aortogram, bilateral iliac angiogram, bifemoral runoff   CARDIAC CATHETERIZATION  05/12/2005   RCA   carotid doppler  01/24/2013   RICA endarterectomy,left CCA 0-49%; left bulb and prox ICA 50-69%; bilaateral subclavian < 50%   CAROTID ENDARTERECTOMY  11/01/2010   CATARACT EXTRACTION     COLONOSCOPY     CORONARY ANGIOPLASTY  05/18/2005   2 STENTS distal RCA AND PROXIMAL-MID RCA   5 total  stents per pt.   CYSTOSCOPY/URETEROSCOPY/HOLMIUM LASER/STENT PLACEMENT Left 01/11/2018   Procedure: LEFT URETEROSCOPY/HOLMIUM LASER/STENT PLACEMENT;  Surgeon: Crist Fat, MD;  Location: [facility];  Service: Urology;  Laterality: Left;   DOPPLER ECHOCARDIOGRAPHY  02/07/2012   EF 55%,SHOWED NO ISCHEMIA    HERNIA REPAIR     umbilical   lower arterial  doppler  02/04/2013   aotra 1.5 x 1.5 cm; distal abd aorta 70-99%,proximal common iliac arteries -very stenotic with increased velocities>50%,may be falsely elevated as a result of residual plaque from the distal aorta stenosis   lower extremity doppler  June 18 ,2013   ABI'S ABNORMAL, RABI was 0.88 and LABI 0.75 ,with 3-vessel  run off   NM MYOVIEW LTD  MAY 23,2011   showed no significant ischemia;   NM MYOVIEW LTD  04/22/2008   ef 77%,exercise capcity 6 METS ,exaggerated blood pressure response to exercise   PERIPHERAL VASCULAR CATHETERIZATION N/A 04/24/2016   Procedure: Lower Extremity Angiography;  Surgeon: Runell Gess, MD;  Location: [facility] INVASIVE CV LAB;  Service: Cardiovascular;  Laterality: N/A;   PERIPHERAL VASCULAR CATHETERIZATION  04/24/2016   Procedure: Peripheral Vascular Intervention;  Surgeon: Runell Gess, MD;  Location: [facility] INVASIVE CV LAB;  Service: Cardiovascular;;  Aorta   retrograde central aortic catheterization  05/19/2005   cutting balloon atherectomy, c-circ stenosis with DES STENTING CYPHER   TONSILLECTOMY     TOTAL HIP ARTHROPLASTY Right 07/11/2022   Procedure: TOTAL HIP ARTHROPLASTY ANTERIOR APPROACH;  Surgeon: Durene Romans, MD;  Location: [facility];  Service: Orthopedics;  Laterality: Right;   TOTAL KNEE ARTHROPLASTY Left 01/03/2019   Procedure: TOTAL KNEE ARTHROPLASTY;  Surgeon: Beverely Low, MD;  Location: [facility];  Service: Orthopedics;  Laterality: Left;  with IS block   TRANSURETHRAL RESECTION OF PROSTATE N/A 09/08/2016   Procedure: TRANSURETHRAL RESECTION OF THE PROSTATE (TURP);  Surgeon: Crist Fat,  MD;  Location: [facility];  Service: Urology;  Laterality: N/A;   TRANSURETHRAL RESECTION OF PROSTATE     10-10-17  Dr. Marlou Porch   TRANSURETHRAL RESECTION OF PROSTATE N/A 10/10/2017   Procedure: TRANSURETHRAL RESECTION OF THE PROSTATE (TURP);  Surgeon: Crist Fat, MD;  Location: [facility];  Service: Urology;  Laterality: N/A;   VASECTOMY      Allergies  Allergen Reactions   Lisinopril Cough   Niaspan [Niacin Er] Rash    Outpatient Encounter Medications as of 03/08/2023  Medication Sig   acetaminophen (TYLENOL) 500 MG tablet Take 1 tablet (500 mg total) by mouth 2 (two) times daily as needed.   amLODipine (NORVASC) 10 MG tablet Take 10 mg by mouth daily.   Apoaequorin (PREVAGEN PO) Take 1 tablet by mouth daily.   atenolol (TENORMIN) 100 MG tablet Take 100 mg by mouth daily.   atorvastatin (LIPITOR) 40 MG tablet Take 40 mg by mouth daily at 6 PM.    betamethasone  dipropionate (DIPROLENE) 0.05 % ointment Apply topically 2 (two) times daily as needed.   Biotin 10 MG TABS Take 10 mg by mouth daily in the afternoon.   Camphor-Menthol-Methyl Sal (SALONPAS) 3.08-05-08 % PTCH Apply 1 patch topically as needed. Special Instructions: Salonpas patch (on in am, off in pm) as needed to affected area   Carboxymethylcellulose Sodium (ARTIFICIAL TEARS OP) Apply 1 drop to eye 4 (four) times daily.   cetirizine (ZYRTEC) 10 MG tablet Take 1 tablet (10 mg total) by mouth at bedtime.   cholecalciferol (VITAMIN D3) 25 MCG (1000 UT) tablet Take 1,000 Units by mouth daily.   clopidogrel (PLAVIX) 75 MG tablet Take 75 mg by mouth every evening.    Dentifrices (BIOTENE DRY MOUTH CARE DT) Place 2 sprays onto teeth 4 (four) times daily as needed.   finasteride (PROSCAR) 5 MG tablet Take 5 mg by mouth daily.   gabapentin (NEURONTIN) 100 MG capsule Take 100 mg by mouth 3 (three) times daily.   Glucosamine-Chondroitin 750-600 MG TABS Take 1 tablet by mouth 2 (two) times daily.   hydrALAZINE (APRESOLINE) 25 MG tablet Take  25 mg by mouth every 8 (eight) hours as needed.   hydrALAZINE (APRESOLINE) 50 MG tablet Take 1 tablet (50 mg total) by mouth 4 (four) times daily.   hydrochlorothiazide (MICROZIDE) 12.5 MG capsule Take 2 capsules (25 mg total) by mouth daily.   HYDROcodone bit-homatropine (HYCODAN) 5-1.5 MG/5ML syrup Take 5 mLs by mouth at bedtime as needed for cough.   iron polysaccharides (NIFEREX) 150 MG capsule Take 150 mg by mouth every Monday, Wednesday, and Friday.   losartan (COZAAR) 50 MG tablet Take 50 mg by mouth 2 (two) times daily.   melatonin 5 MG TABS Take 1 tablet (5 mg total) by mouth at bedtime.   Misc Natural Products (GLUCOSAMINE CHONDROITIN TRIPLE) TABS Take 1 tablet by mouth 2 (two) times daily.   Multiple Vitamin (MULTIVITAMIN WITH MINERALS) TABS tablet Take 1 tablet by mouth daily.   ondansetron (ZOFRAN) 4 MG tablet Take 4 mg by mouth every 8 (eight) hours as needed for nausea or vomiting.   pantoprazole (PROTONIX) 40 MG tablet Take 40 mg by mouth 2 (two) times daily.   potassium chloride SA (K-DUR,KLOR-CON) 20 MEQ tablet Take 20 mEq by mouth every evening.    tamsulosin (FLOMAX) 0.4 MG CAPS capsule Take 1 capsule (0.4 mg total) by mouth daily after supper.   No facility-administered encounter medications on file as of 03/08/2023.    Review of Systems:  Review of Systems  Constitutional:  Negative for activity change, appetite change, chills, diaphoresis, fatigue, fever and unexpected weight change.  Respiratory:  Positive for cough. Negative for shortness of breath, wheezing and stridor.   Cardiovascular:  Negative for chest pain, palpitations and leg swelling.  Gastrointestinal:  Negative for abdominal distention, abdominal pain, constipation and diarrhea.  Genitourinary:  Negative for difficulty urinating and dysuria.  Musculoskeletal:  Positive for gait problem and neck pain. Negative for arthralgias, back pain, joint swelling and myalgias.       Leg pain, foot pain  Neurological:   Negative for dizziness, seizures, syncope, facial asymmetry, speech difficulty, weakness and headaches.  Hematological:  Negative for adenopathy. Does not bruise/bleed easily.  Psychiatric/Behavioral:  Negative for agitation, behavioral problems and confusion.     Health Maintenance  Topic Date Due   INFLUENZA VACCINE  03/01/2023   COVID-19 Vaccine (6 - 2023-24 season) 03/18/2023 (Originally 01/18/2023)   Pneumonia Vaccine 53+ Years old (2 of  2 - PCV) 08/19/2023 (Originally 09/25/2019)   DTaP/Tdap/Td (2 - Tdap) 09/09/2023   Medicare Annual Wellness (AWV)  11/28/2023   Zoster Vaccines- Shingrix  Completed   HPV VACCINES  Aged Out    Physical Exam: Vitals:   03/08/23 1545  BP: (!) 168/63  Pulse: 62  Resp: 18  Temp: (!) 97.5 F (36.4 C)  SpO2: 92%  Weight: 174 lb (78.9 kg)   Body mass index is 29.87 kg/m. Physical Exam Vitals and nursing note reviewed.  Constitutional:      General: He is not in acute distress.    Appearance: He is not diaphoretic.  HENT:     Head: Normocephalic and atraumatic.     Right Ear: Tympanic membrane normal.     Left Ear: Tympanic membrane normal.     Nose: Nose normal.     Mouth/Throat:     Mouth: Mucous membranes are moist.     Pharynx: Oropharynx is clear.  Eyes:     Conjunctiva/sclera: Conjunctivae normal.     Pupils: Pupils are equal, round, and reactive to light.  Neck:     Thyroid: No thyromegaly.     Vascular: No JVD.     Trachea: No tracheal deviation.  Cardiovascular:     Rate and Rhythm: Normal rate and regular rhythm.     Heart sounds: No murmur heard. Pulmonary:     Effort: Pulmonary effort is normal. No respiratory distress.     Breath sounds: Normal breath sounds. No wheezing.  Abdominal:     General: Bowel sounds are normal. There is no distension.     Palpations: Abdomen is soft.     Tenderness: There is no abdominal tenderness.  Musculoskeletal:        General: No swelling, tenderness, deformity or signs of injury.      Right lower leg: No edema.     Left lower leg: No edema.     Comments: Strength 5/5 to BUE and BLE.  +CMS to neck and both arms   Lymphadenopathy:     Cervical: No cervical adenopathy.  Skin:    General: Skin is warm and dry.  Neurological:     Mental Status: He is alert and oriented to person, place, and time.     Cranial Nerves: No cranial nerve deficit.     Labs reviewed: Basic Metabolic Panel: Recent Labs    05/13/22 2334 05/13/22 2341 07/11/22 0334 07/12/22 0329 07/12/22 0330 07/13/22 0335 07/20/22 0000 07/27/22 0000 10/17/22 0000 03/01/23 1450  NA  --    < > 142 143 143 145 141 140 142 142  K  --    < > 3.8 4.6 4.6 4.0 4.2 3.9 4.0 4.2  CL  --    < > 108 110 110 115* 107 103 105 107  CO2  --    < > 26 25 24 25  23* 23* 26*  --   GLUCOSE  --    < > 129* 179* 181* 186*  --   --   --  109*  BUN  --    < > 16 26* 24* 33* 33* 30* 21 20  CREATININE  --    < > 0.84 1.32* 1.34* 1.04 1.0 1.5* 1.0 1.10  CALCIUM  --    < > 8.7* 8.7* 8.7* 8.8* 9.0 9.4 9.6  --   MG  --    < > 2.1 2.4  --  2.2  --   --   --   --  PHOS 3.2   < > 3.3  --  3.4 2.8  --   --   --   --   TSH 1.104  --   --   --   --   --   --   --   --   --    < > = values in this interval not displayed.   Liver Function Tests: Recent Labs    05/13/22 1510 05/13/22 2334 05/14/22 0452 07/11/22 0334 07/12/22 0330 07/13/22 0335 10/17/22 0000  AST 22 17 19   --   --   --  16  ALT 21 22 21   --   --   --  15  ALKPHOS 70 66 64  --   --   --  111  BILITOT 0.7 0.9 0.8  --   --   --   --   PROT 6.7 6.3* 6.1*  --   --   --   --   ALBUMIN 3.5 3.3* 3.2*   < > 2.7* 2.9* 4.1   < > = values in this interval not displayed.   No results for input(s): "LIPASE", "AMYLASE" in the last 8760 hours. Recent Labs    05/13/22 2334  AMMONIA 18   CBC: Recent Labs    05/15/22 0613 05/16/22 0110 07/09/22 1450 07/11/22 0334 07/12/22 0329 07/13/22 0335 07/20/22 0000 07/27/22 0000 10/17/22 0000 03/01/23 1450  WBC 7.3  8.7 9.0 10.2 14.4* 16.0* 11.0 15.5 7.6  --   NEUTROABS 5.0 6.4 7.3  --   --   --   --   --   --   --   HGB 13.5 12.6* 12.6* 12.2* 10.5* 9.6* 10.4* 10.5* 12.6* 12.6*  HCT 40.6 38.4* 38.5* 37.6* 32.6* 30.0* 32* 33* 36* 37.0*  MCV 83.2 83.3 87.1 88.3 89.6 89.3  --   --   --   --   PLT 195 189 257 202 201 205 258 296 217  --    Lipid Panel: Recent Labs    10/17/22 0000  CHOL 133  HDL 49  LDLCALC 65  TRIG 122   Lab Results  Component Value Date   HGBA1C 5.5 11/23/2016    Procedures since last visit: CT Head Wo Contrast  Addendum Date: 03/01/2023   ADDENDUM REPORT: 03/01/2023 16:21 ADDENDUM: Findings discussed by telephone with Dr. Rosalia Hammers at 3:50 p.m., 03/01/2023. Electronically Signed   By: Jearld Lesch M.D.   On: 03/01/2023 16:21   Result Date: 03/01/2023 CLINICAL DATA:  Fall EXAM: CT HEAD WITHOUT CONTRAST CT CERVICAL SPINE WITHOUT CONTRAST TECHNIQUE: Multidetector CT imaging of the head and cervical spine was performed following the standard protocol without intravenous contrast. Multiplanar CT image reconstructions of the cervical spine were also generated. RADIATION DOSE REDUCTION: This exam was performed according to the departmental dose-optimization program which includes automated exposure control, adjustment of the mA and/or kV according to patient size and/or use of iterative reconstruction technique. COMPARISON:  05/09/2022 FINDINGS: CT HEAD FINDINGS Brain: No evidence of acute infarction, hemorrhage, hydrocephalus, extra-axial collection or mass lesion/mass effect. Extensive periventricular and deep white matter hypodensity. Vascular: No hyperdense vessel or unexpected calcification. Skull: Normal. Negative for fracture or focal lesion. Sinuses/Orbits: No acute finding. Other: None. CT CERVICAL SPINE FINDINGS Alignment: Degenerative and positional straightening of the normal cervical lordosis. Skull base and vertebrae: Minimally displaced, unilateral fracture of the base of the left  lamina of C1 (series 8, image 17). No other fracture appreciated. No primary bone lesion  or focal pathologic process. Soft tissues and spinal canal: No prevertebral fluid or swelling. No visible canal hematoma. Disc levels: Focally severe disc space height loss and osteophytosis of C5-C6 with otherwise mild disc degenerative change throughout the cervical spine. Upper chest: Negative. Other: None. IMPRESSION: 1. No acute intracranial pathology. Small-vessel white matter disease, in keeping with advanced patient age. 2. Minimally displaced, unilateral fracture of the base of the left lamina of C1. No other cervical fracture. 3. Focally severe disc space height loss and osteophytosis of C5-C6 with otherwise mild disc degenerative change throughout the cervical spine. Call report request placed at the time of interpretation. Report issued at this time in the interest of expediency. Final communication will be documented. Electronically Signed: By: Jearld Lesch M.D. On: 03/01/2023 15:46   CT Cervical Spine Wo Contrast  Addendum Date: 03/01/2023   ADDENDUM REPORT: 03/01/2023 16:21 ADDENDUM: Findings discussed by telephone with Dr. Rosalia Hammers at 3:50 p.m., 03/01/2023. Electronically Signed   By: Jearld Lesch M.D.   On: 03/01/2023 16:21   Result Date: 03/01/2023 CLINICAL DATA:  Fall EXAM: CT HEAD WITHOUT CONTRAST CT CERVICAL SPINE WITHOUT CONTRAST TECHNIQUE: Multidetector CT imaging of the head and cervical spine was performed following the standard protocol without intravenous contrast. Multiplanar CT image reconstructions of the cervical spine were also generated. RADIATION DOSE REDUCTION: This exam was performed according to the departmental dose-optimization program which includes automated exposure control, adjustment of the mA and/or kV according to patient size and/or use of iterative reconstruction technique. COMPARISON:  05/09/2022 FINDINGS: CT HEAD FINDINGS Brain: No evidence of acute infarction, hemorrhage,  hydrocephalus, extra-axial collection or mass lesion/mass effect. Extensive periventricular and deep white matter hypodensity. Vascular: No hyperdense vessel or unexpected calcification. Skull: Normal. Negative for fracture or focal lesion. Sinuses/Orbits: No acute finding. Other: None. CT CERVICAL SPINE FINDINGS Alignment: Degenerative and positional straightening of the normal cervical lordosis. Skull base and vertebrae: Minimally displaced, unilateral fracture of the base of the left lamina of C1 (series 8, image 17). No other fracture appreciated. No primary bone lesion or focal pathologic process. Soft tissues and spinal canal: No prevertebral fluid or swelling. No visible canal hematoma. Disc levels: Focally severe disc space height loss and osteophytosis of C5-C6 with otherwise mild disc degenerative change throughout the cervical spine. Upper chest: Negative. Other: None. IMPRESSION: 1. No acute intracranial pathology. Small-vessel white matter disease, in keeping with advanced patient age. 2. Minimally displaced, unilateral fracture of the base of the left lamina of C1. No other cervical fracture. 3. Focally severe disc space height loss and osteophytosis of C5-C6 with otherwise mild disc degenerative change throughout the cervical spine. Call report request placed at the time of interpretation. Report issued at this time in the interest of expediency. Final communication will be documented. Electronically Signed: By: Jearld Lesch M.D. On: 03/01/2023 15:46   CT CHEST ABDOMEN PELVIS W CONTRAST  Result Date: 03/01/2023 CLINICAL DATA:  Fall EXAM: CT CHEST, ABDOMEN, AND PELVIS WITH CONTRAST CT THORACIC AND LUMBAR SPINE WITH CONTRAST TECHNIQUE: Multidetector CT imaging of the chest, abdomen and pelvis was performed following the standard protocol during bolus administration of intravenous contrast. Multidetector CT imaging of the thoracic and lumbar spine was performed following the standard protocol during  bolus administration of intravenous contrast. RADIATION DOSE REDUCTION: This exam was performed according to the departmental dose-optimization program which includes automated exposure control, adjustment of the mA and/or kV according to patient size and/or use of iterative reconstruction technique. CONTRAST:  75mL OMNIPAQUE  IOHEXOL 350 MG/ML SOLN COMPARISON:  CT abdomen pelvis, 12/26/2017 FINDINGS: CT CHEST FINDINGS Cardiovascular: Aortic atherosclerosis. Cardiomegaly. Three-vessel coronary artery calcifications and or stents. No pericardial effusion. Mediastinum/Nodes: No enlarged mediastinal, hilar, or axillary lymph nodes. Small calcified mediastinal and bilateral hilar lymph nodes, consistent with sequelae of prior granulomatous infection thyroid gland, trachea, and esophagus demonstrate no significant findings. Lungs/Pleura: Small, calcified benign granulomatous nodules, requiring no further follow-up or characterization. No pleural effusion or pneumothorax. Musculoskeletal: No chest wall mass or suspicious osseous lesions identified. CT ABDOMEN PELVIS FINDINGS Hepatobiliary: No solid liver abnormality is seen. Simple, benign liver cysts, for which no further follow-up or characterization is required. No gallstones, gallbladder wall thickening, or biliary dilatation. Pancreas: Unremarkable. No pancreatic ductal dilatation or surrounding inflammatory changes. Spleen: Normal in size without significant abnormality. Benign granulomatous calcifications throughout the spleen. Adrenals/Urinary Tract: Definitively benign, fat containing adenomatous thickening of the bilateral adrenal glands, for which no further follow-up or characterization is required. Simple, benign bilateral renal cortical cysts, for which no further follow-up or characterization is required. Scarred, atrophic left renal cortex. No calculi or hydronephrosis. Right kidney is normal, without renal calculi, solid lesion, or hydronephrosis. Bladder  is unremarkable. Stomach/Bowel: Stomach is within normal limits. Appendix appears normal. No evidence of bowel wall thickening, distention, or inflammatory changes. Sigmoid diverticulosis. Vascular/Lymphatic: Severe aortic atherosclerosis. Stent of the infrarenal abdominal aorta. No enlarged abdominal or pelvic lymph nodes. Reproductive: No mass or other abnormality. Other: Small fat containing left inguinal hernia.  No ascites. Musculoskeletal: No acute osseous findings. Status post right hip total arthroplasty. CT THORACIC AND LUMBAR SPINE FINDINGS Alignment: Normal thoracic kyphosis. Normal lumbar lordosis. Vertebral bodies: Unchanged, nonacute minimal wedging of L1 (series 9, image 55). No acute fracture or dislocation. Disc spaces: Intact. Paraspinous soft tissues: Unremarkable. IMPRESSION: 1. No acute CT findings of the chest, abdomen, or pelvis. 2. Unchanged, nonacute minimal wedging of L1. No acute fracture or dislocation of the thoracic or lumbar spine. 3. Cardiomegaly and coronary artery disease. 4. Sigmoid diverticulosis without evidence of acute diverticulitis. Aortic Atherosclerosis (ICD10-I70.0). Electronically Signed   By: Jearld Lesch M.D.   On: 03/01/2023 16:09   CT L-SPINE NO CHARGE  Result Date: 03/01/2023 CLINICAL DATA:  Fall EXAM: CT CHEST, ABDOMEN, AND PELVIS WITH CONTRAST CT THORACIC AND LUMBAR SPINE WITH CONTRAST TECHNIQUE: Multidetector CT imaging of the chest, abdomen and pelvis was performed following the standard protocol during bolus administration of intravenous contrast. Multidetector CT imaging of the thoracic and lumbar spine was performed following the standard protocol during bolus administration of intravenous contrast. RADIATION DOSE REDUCTION: This exam was performed according to the departmental dose-optimization program which includes automated exposure control, adjustment of the mA and/or kV according to patient size and/or use of iterative reconstruction technique.  CONTRAST:  75mL OMNIPAQUE IOHEXOL 350 MG/ML SOLN COMPARISON:  CT abdomen pelvis, 12/26/2017 FINDINGS: CT CHEST FINDINGS Cardiovascular: Aortic atherosclerosis. Cardiomegaly. Three-vessel coronary artery calcifications and or stents. No pericardial effusion. Mediastinum/Nodes: No enlarged mediastinal, hilar, or axillary lymph nodes. Small calcified mediastinal and bilateral hilar lymph nodes, consistent with sequelae of prior granulomatous infection thyroid gland, trachea, and esophagus demonstrate no significant findings. Lungs/Pleura: Small, calcified benign granulomatous nodules, requiring no further follow-up or characterization. No pleural effusion or pneumothorax. Musculoskeletal: No chest wall mass or suspicious osseous lesions identified. CT ABDOMEN PELVIS FINDINGS Hepatobiliary: No solid liver abnormality is seen. Simple, benign liver cysts, for which no further follow-up or characterization is required. No gallstones, gallbladder wall thickening, or biliary dilatation. Pancreas: Unremarkable.  No pancreatic ductal dilatation or surrounding inflammatory changes. Spleen: Normal in size without significant abnormality. Benign granulomatous calcifications throughout the spleen. Adrenals/Urinary Tract: Definitively benign, fat containing adenomatous thickening of the bilateral adrenal glands, for which no further follow-up or characterization is required. Simple, benign bilateral renal cortical cysts, for which no further follow-up or characterization is required. Scarred, atrophic left renal cortex. No calculi or hydronephrosis. Right kidney is normal, without renal calculi, solid lesion, or hydronephrosis. Bladder is unremarkable. Stomach/Bowel: Stomach is within normal limits. Appendix appears normal. No evidence of bowel wall thickening, distention, or inflammatory changes. Sigmoid diverticulosis. Vascular/Lymphatic: Severe aortic atherosclerosis. Stent of the infrarenal abdominal aorta. No enlarged abdominal  or pelvic lymph nodes. Reproductive: No mass or other abnormality. Other: Small fat containing left inguinal hernia.  No ascites. Musculoskeletal: No acute osseous findings. Status post right hip total arthroplasty. CT THORACIC AND LUMBAR SPINE FINDINGS Alignment: Normal thoracic kyphosis. Normal lumbar lordosis. Vertebral bodies: Unchanged, nonacute minimal wedging of L1 (series 9, image 55). No acute fracture or dislocation. Disc spaces: Intact. Paraspinous soft tissues: Unremarkable. IMPRESSION: 1. No acute CT findings of the chest, abdomen, or pelvis. 2. Unchanged, nonacute minimal wedging of L1. No acute fracture or dislocation of the thoracic or lumbar spine. 3. Cardiomegaly and coronary artery disease. 4. Sigmoid diverticulosis without evidence of acute diverticulitis. Aortic Atherosclerosis (ICD10-I70.0). Electronically Signed   By: Jearld Lesch M.D.   On: 03/01/2023 16:09   CT T-SPINE NO CHARGE  Result Date: 03/01/2023 CLINICAL DATA:  Fall EXAM: CT CHEST, ABDOMEN, AND PELVIS WITH CONTRAST CT THORACIC AND LUMBAR SPINE WITH CONTRAST TECHNIQUE: Multidetector CT imaging of the chest, abdomen and pelvis was performed following the standard protocol during bolus administration of intravenous contrast. Multidetector CT imaging of the thoracic and lumbar spine was performed following the standard protocol during bolus administration of intravenous contrast. RADIATION DOSE REDUCTION: This exam was performed according to the departmental dose-optimization program which includes automated exposure control, adjustment of the mA and/or kV according to patient size and/or use of iterative reconstruction technique. CONTRAST:  75mL OMNIPAQUE IOHEXOL 350 MG/ML SOLN COMPARISON:  CT abdomen pelvis, 12/26/2017 FINDINGS: CT CHEST FINDINGS Cardiovascular: Aortic atherosclerosis. Cardiomegaly. Three-vessel coronary artery calcifications and or stents. No pericardial effusion. Mediastinum/Nodes: No enlarged mediastinal, hilar,  or axillary lymph nodes. Small calcified mediastinal and bilateral hilar lymph nodes, consistent with sequelae of prior granulomatous infection thyroid gland, trachea, and esophagus demonstrate no significant findings. Lungs/Pleura: Small, calcified benign granulomatous nodules, requiring no further follow-up or characterization. No pleural effusion or pneumothorax. Musculoskeletal: No chest wall mass or suspicious osseous lesions identified. CT ABDOMEN PELVIS FINDINGS Hepatobiliary: No solid liver abnormality is seen. Simple, benign liver cysts, for which no further follow-up or characterization is required. No gallstones, gallbladder wall thickening, or biliary dilatation. Pancreas: Unremarkable. No pancreatic ductal dilatation or surrounding inflammatory changes. Spleen: Normal in size without significant abnormality. Benign granulomatous calcifications throughout the spleen. Adrenals/Urinary Tract: Definitively benign, fat containing adenomatous thickening of the bilateral adrenal glands, for which no further follow-up or characterization is required. Simple, benign bilateral renal cortical cysts, for which no further follow-up or characterization is required. Scarred, atrophic left renal cortex. No calculi or hydronephrosis. Right kidney is normal, without renal calculi, solid lesion, or hydronephrosis. Bladder is unremarkable. Stomach/Bowel: Stomach is within normal limits. Appendix appears normal. No evidence of bowel wall thickening, distention, or inflammatory changes. Sigmoid diverticulosis. Vascular/Lymphatic: Severe aortic atherosclerosis. Stent of the infrarenal abdominal aorta. No enlarged abdominal or pelvic lymph nodes. Reproductive: No mass  or other abnormality. Other: Small fat containing left inguinal hernia.  No ascites. Musculoskeletal: No acute osseous findings. Status post right hip total arthroplasty. CT THORACIC AND LUMBAR SPINE FINDINGS Alignment: Normal thoracic kyphosis. Normal lumbar  lordosis. Vertebral bodies: Unchanged, nonacute minimal wedging of L1 (series 9, image 55). No acute fracture or dislocation. Disc spaces: Intact. Paraspinous soft tissues: Unremarkable. IMPRESSION: 1. No acute CT findings of the chest, abdomen, or pelvis. 2. Unchanged, nonacute minimal wedging of L1. No acute fracture or dislocation of the thoracic or lumbar spine. 3. Cardiomegaly and coronary artery disease. 4. Sigmoid diverticulosis without evidence of acute diverticulitis. Aortic Atherosclerosis (ICD10-I70.0). Electronically Signed   By: Jearld Lesch M.D.   On: 03/01/2023 16:09   DG Pelvis Portable  Result Date: 03/01/2023 CLINICAL DATA:  fall EXAM: PORTABLE PELVIS 1-2 VIEWS COMPARISON:  07/11/2022. FINDINGS: Pelvis is intact with normal and symmetric sacroiliac joints. No acute fracture or dislocation. No aggressive osseous lesion. Visualized sacral arcuate lines are unremarkable. Unremarkable symphysis pubis. There are mild degenerative changes of the left hip joint with mild-to-moderate joint space narrowing and minimal osteophyte formation. No radiopaque foreign bodies. IMPRESSION: 1. Mild left hip osteoarthritis. Electronically Signed   By: Jules Schick M.D.   On: 03/01/2023 15:16   DG Chest Portable 1 View  Result Date: 03/01/2023 CLINICAL DATA:  fall EXAM: PORTABLE CHEST 1 VIEW COMPARISON:  07/09/2022. FINDINGS: Bilateral lung fields are clear. Bilateral costophrenic angles are clear. Stable cardio-mediastinal silhouette. Aortic arch calcifications noted. No acute osseous abnormalities. No acute displaced rib fracture seen. The soft tissues are within normal limits. IMPRESSION: No active disease. Electronically Signed   By: Jules Schick M.D.   On: 03/01/2023 15:14    Assessment/Plan  1. Other closed displaced fracture of first cervical vertebra, initial encounter (HCC) Continue [place] collar until apt with neurosurgery Tylenol for pain  Will hold off on therapy until f/u apt  2.  Essential hypertension Above goal  Discussed with Dr Chales Abrahams, will d/c atenolol and start metoprolol and monitor bp  3. Peptic ulcer Continue protonix  4. Iron deficiency anemia, unspecified iron deficiency anemia type Lab Results  Component Value Date   HGB 12.6 (L) 03/01/2023  Continue Niferex   5. Mixed hyperlipidemia Lab Results  Component Value Date   LDLCALC 65 10/17/2022   Continue lipitor

## 2023-03-12 MED ORDER — METOPROLOL TARTRATE 50 MG PO TABS: 75.0000 mg | ORAL_TABLET | Freq: Every day | ORAL | Status: DC

## 2023-03-12 MED ORDER — METOPROLOL TARTRATE 50 MG PO TABS: 25.0000 mg | ORAL_TABLET | Freq: Every morning | ORAL | Status: DC

## 2023-03-15 ENCOUNTER — Other Ambulatory Visit: Payer: Self-pay | Admitting: Family Medicine

## 2023-03-16 NOTE — Progress Notes (Unsigned)
Referring Physician:  Margarita Grizzle, MD [address] [place],  [place] [address]  Primary Physician:  Mahlon Gammon, MD  History of Present Illness: 03/21/2023 Mr. Nathan Santiago has a history of urinary retention, peripheral neuropathy, HTN, AAA, PAD, CAD, GERD, and hyperlipidemia.   He was seen in ED at [facility] on 03/01/23 after fall and was found to have left lamina fracture of C1. This was reviewed with neurosurgery and they recommended cervical collar.   He is here for follow up. He is resident at [facility] in [place].   He has no neck pain. No arm pain. No numbness, tingling, or weakness. He hates the cervical collar- it is bothering him.   He is on PLAVIX.    Review of Systems:  A 10 point review of systems is negative, except for the pertinent positives and negatives detailed in the HPI.  Past Medical History: Past Medical History:  Diagnosis Date   AAA (abdominal aortic aneurysm) (HCC)    asymptomatic   AAA (abdominal aortic aneurysm) (HCC) 2010   peripheral  angiogram-- bilateral SFA DISEASE  and 60 to 70% infrarenaal abd. aortic stenosis with 15 -mm gradient   Adenomatous colon polyp 11/2003   CAD (coronary artery disease)    Gait abnormality 02/13/2017   Gastroparesis    pt denies   GERD (gastroesophageal reflux disease)    w/ LPR   History of kidney stones    x2   Hyperlipidemia    Hypertension    IDA (iron deficiency anemia)    Peripheral arterial disease (HCC)    RCEA  by Dr [name]   PUD (peptic ulcer disease)    pt unaware   Vertigo     Past Surgical History: Past Surgical History:  Procedure Laterality Date   ABDOMINAL AORTOGRAM W/LOWER EXTREMITY Bilateral 08/05/2018   Procedure: ABDOMINAL AORTOGRAM W/LOWER EXTREMITY;  Surgeon: Runell Gess, MD;  Location: [facility] INVASIVE CV LAB;  Service: Cardiovascular;  Laterality: Bilateral;   ABDOMINAL AORTOGRAM W/LOWER EXTREMITY N/A 09/26/2021   Procedure: ABDOMINAL AORTOGRAM W/LOWER EXTREMITY;   Surgeon: Runell Gess, MD;  Location: [facility] INVASIVE CV LAB;  Service: Cardiovascular;  Laterality: N/A;   AORTOGRAM  04/24/2016    Abdominal aortogram, bilateral iliac angiogram, bifemoral runoff   CARDIAC CATHETERIZATION  05/12/2005   RCA   carotid doppler  01/24/2013   RICA endarterectomy,left CCA 0-49%; left bulb and prox ICA 50-69%; bilaateral subclavian < 50%   CAROTID ENDARTERECTOMY  11/01/2010   CATARACT EXTRACTION     COLONOSCOPY     CORONARY ANGIOPLASTY  05/18/2005   2 STENTS distal RCA AND PROXIMAL-MID RCA   5 total stents per pt.   CYSTOSCOPY/URETEROSCOPY/HOLMIUM LASER/STENT PLACEMENT Left 01/11/2018   Procedure: LEFT URETEROSCOPY/HOLMIUM LASER/STENT PLACEMENT;  Surgeon: Crist Fat, MD;  Location: [facility];  Service: Urology;  Laterality: Left;   DOPPLER ECHOCARDIOGRAPHY  02/07/2012   EF 55%,SHOWED NO ISCHEMIA    HERNIA REPAIR     umbilical   lower arterial  doppler  02/04/2013   aotra 1.5 x 1.5 cm; distal abd aorta 70-99%,proximal common iliac arteries -very stenotic with increased velocities>50%,may be falsely elevated as a result of residual plaque from the distal aorta stenosis   lower extremity doppler  June 18 ,2013   ABI'S ABNORMAL, RABI was 0.88 and LABI 0.75 ,with 3-vessel  run off   NM MYOVIEW LTD  MAY 23,2011   showed no significant ischemia;   NM MYOVIEW LTD  04/22/2008   ef 77%,exercise  capcity 6 METS ,exaggerated blood pressure response to exercise   PERIPHERAL VASCULAR CATHETERIZATION N/A 04/24/2016   Procedure: Lower Extremity Angiography;  Surgeon: Runell Gess, MD;  Location: [facility] INVASIVE CV LAB;  Service: Cardiovascular;  Laterality: N/A;   PERIPHERAL VASCULAR CATHETERIZATION  04/24/2016   Procedure: Peripheral Vascular Intervention;  Surgeon: Runell Gess, MD;  Location: [facility] INVASIVE CV LAB;  Service: Cardiovascular;;  Aorta   retrograde central aortic catheterization  05/19/2005   cutting balloon atherectomy, c-circ stenosis with DES STENTING  CYPHER   TONSILLECTOMY     TOTAL HIP ARTHROPLASTY Right 07/11/2022   Procedure: TOTAL HIP ARTHROPLASTY ANTERIOR APPROACH;  Surgeon: Durene Romans, MD;  Location: [facility];  Service: Orthopedics;  Laterality: Right;   TOTAL KNEE ARTHROPLASTY Left 01/03/2019   Procedure: TOTAL KNEE ARTHROPLASTY;  Surgeon: Beverely Low, MD;  Location: [facility];  Service: Orthopedics;  Laterality: Left;  with IS block   TRANSURETHRAL RESECTION OF PROSTATE N/A 09/08/2016   Procedure: TRANSURETHRAL RESECTION OF THE PROSTATE (TURP);  Surgeon: Crist Fat, MD;  Location: [facility];  Service: Urology;  Laterality: N/A;   TRANSURETHRAL RESECTION OF PROSTATE     10-10-17  Dr. Marlou Porch   TRANSURETHRAL RESECTION OF PROSTATE N/A 10/10/2017   Procedure: TRANSURETHRAL RESECTION OF THE PROSTATE (TURP);  Surgeon: Crist Fat, MD;  Location: [facility];  Service: Urology;  Laterality: N/A;   VASECTOMY      Allergies: Allergies as of 03/21/2023 - Review Complete 03/21/2023  Allergen Reaction Noted   Lisinopril Cough    Niaspan [niacin er] Rash 04/11/2011    Medications: Outpatient Encounter Medications as of 03/21/2023  Medication Sig   acetaminophen (TYLENOL) 500 MG tablet Take 1 tablet (500 mg total) by mouth 2 (two) times daily as needed.   amLODipine (NORVASC) 10 MG tablet Take 10 mg by mouth daily.   Apoaequorin (PREVAGEN PO) Take 1 tablet by mouth daily.   atorvastatin (LIPITOR) 40 MG tablet Take 40 mg by mouth daily at 6 PM.    betamethasone dipropionate (DIPROLENE) 0.05 % ointment Apply topically 2 (two) times daily as needed.   Biotin 10 MG TABS Take 10 mg by mouth daily in the afternoon.   Camphor-Menthol-Methyl Sal (SALONPAS) 3.08-05-08 % PTCH Apply 1 patch topically as needed. Special Instructions: Salonpas patch (on in am, off in pm) as needed to affected area   Carboxymethylcellulose Sodium (ARTIFICIAL TEARS OP) Apply 1 drop to eye 4 (four) times daily.   cetirizine (ZYRTEC) 10 MG tablet Take 1 tablet (10 mg  total) by mouth at bedtime.   cholecalciferol (VITAMIN D3) 25 MCG (1000 UT) tablet Take 1,000 Units by mouth daily.   clopidogrel (PLAVIX) 75 MG tablet Take 75 mg by mouth every evening.    Dentifrices (BIOTENE DRY MOUTH CARE DT) Place 2 sprays onto teeth 4 (four) times daily as needed.   finasteride (PROSCAR) 5 MG tablet Take 5 mg by mouth daily.   gabapentin (NEURONTIN) 100 MG capsule Take 100 mg by mouth 3 (three) times daily.   Glucosamine-Chondroitin 750-600 MG TABS Take 1 tablet by mouth 2 (two) times daily.   hydrALAZINE (APRESOLINE) 25 MG tablet Take 25 mg by mouth every 8 (eight) hours as needed.   hydrALAZINE (APRESOLINE) 50 MG tablet Take 1 tablet (50 mg total) by mouth 4 (four) times daily.   hydrochlorothiazide (MICROZIDE) 12.5 MG capsule Take 2 capsules (25 mg total) by mouth daily.   HYDROcodone bit-homatropine (HYCODAN) 5-1.5 MG/5ML syrup Take 5 mLs by mouth at  bedtime as needed for cough.   iron polysaccharides (NIFEREX) 150 MG capsule Take 150 mg by mouth every Monday, Wednesday, and Friday.   losartan (COZAAR) 50 MG tablet Take 50 mg by mouth 2 (two) times daily.   melatonin 5 MG TABS Take 1 tablet (5 mg total) by mouth at bedtime.   metoprolol tartrate (LOPRESSOR) 50 MG tablet Take 0.5 tablets (25 mg total) by mouth in the morning.   metoprolol tartrate (LOPRESSOR) 50 MG tablet Take 1.5 tablets (75 mg total) by mouth at bedtime.   Misc Natural Products (GLUCOSAMINE CHONDROITIN TRIPLE) TABS Take 1 tablet by mouth 2 (two) times daily.   Multiple Vitamin (MULTIVITAMIN WITH MINERALS) TABS tablet Take 1 tablet by mouth daily.   ondansetron (ZOFRAN) 4 MG tablet Take 4 mg by mouth every 8 (eight) hours as needed for nausea or vomiting.   pantoprazole (PROTONIX) 40 MG tablet Take 40 mg by mouth 2 (two) times daily.   potassium chloride SA (K-DUR,KLOR-CON) 20 MEQ tablet Take 20 mEq by mouth every evening.    tamsulosin (FLOMAX) 0.4 MG CAPS capsule Take 1 capsule (0.4 mg total) by  mouth daily after supper.   No facility-administered encounter medications on file as of 03/21/2023.    Social History: Social History   Tobacco Use   Smoking status: Former    Current packs/day: 0.00    Average packs/day: 2.0 packs/day for 20.0 years (40.0 ttl pk-yrs)    Types: Cigarettes    Start date: 07/31/1948    Quit date: 07/31/1968    Years since quitting: 54.6   Smokeless tobacco: Never  Vaping Use   Vaping status: Never Used  Substance Use Topics   Alcohol use: Not Currently    Alcohol/week: 4.0 standard drinks of alcohol    Types: 4 Glasses of wine per week   Drug use: Never    Family Medical History: Family History  Problem Relation Age of Onset   Ovarian cancer Sister    Heart disease Father    Brain cancer Brother        Tumor    Physical Examination: Vitals:   03/21/23 1410  BP: 128/64    General: Patient is well developed, well nourished, calm, collected, and in no apparent distress. Attention to examination is appropriate.  Respiratory: Patient is breathing without any difficulty.   NEUROLOGICAL:     Awake, alert, oriented to person, place, and time.  Speech is clear and fluent. Fund of knowledge is appropriate.   Cranial Nerves: Pupils equal round and reactive to light.  Facial tone is symmetric.    He has no posterior cervical tenderness.  He has mild bilateral trapezial tenderness.   No abnormal lesions on exposed skin.   Strength: Side Biceps Triceps Deltoid Interossei Grip Wrist Ext. Wrist Flex.  R 5 5 5 5 5 5 5   L 5 5 5 5 5 5 5    Side Iliopsoas Quads Hamstring PF DF EHL  R 5 5 5 5 5 5   L 5 5 5 5 5 5    Reflexes are 2+ and symmetric at the biceps, brachioradialis, patella and achilles.   Hoffman's is absent.  Clonus is not present.   Bilateral upper and lower extremity sensation is intact to light touch.     Gait not tested. He is in a WC.   Medical Decision Making  Imaging: Cervical xrays dated 03/21/23:  Difficult to  visualize C1 fracture. Cervical spondylosis with DDD C5-C6.   Radiology report not yet  available.   CT of cervical spine dated 03/01/23:  CT CERVICAL SPINE FINDINGS   Alignment: Degenerative and positional straightening of the normal cervical lordosis.   Skull base and vertebrae: Minimally displaced, unilateral fracture of the base of the left lamina of C1 (series 8, image 17). No other fracture appreciated. No primary bone lesion or focal pathologic process.   Soft tissues and spinal canal: No prevertebral fluid or swelling. No visible canal hematoma.   Disc levels: Focally severe disc space height loss and osteophytosis of C5-C6 with otherwise mild disc degenerative change throughout the cervical spine.   Upper chest: Negative.   Other: None.   IMPRESSION: 1. No acute intracranial pathology. Small-vessel white matter disease, in keeping with advanced patient age. 2. Minimally displaced, unilateral fracture of the base of the left lamina of C1. No other cervical fracture. 3. Focally severe disc space height loss and osteophytosis of C5-C6 with otherwise mild disc degenerative change throughout the cervical spine.   Call report request placed at the time of interpretation. Report issued at this time in the interest of expediency. Final communication will be documented.   Electronically Signed: By: Jearld Lesch M.D. On: 03/01/2023 15:46  I have personally reviewed the images and agree with the above interpretation.  Above CT reviewed with Dr. Myer Haff prior to his visit.   Assessment and Plan: Mr. Nathan Santiago is a pleasant 89 y.o. male was seen in ED at [facility] on 03/01/23 after fall and was found to have left lamina fracture of C1.   He has no neck pain. No arm pain. No numbness, tingling, or weakness.   Xrays from today show cervical spondylosis and DDD C5-C6. Difficult to visualize fracture. Reviewed CT previously with Dr. Myer Haff and this is a stable fracture.    Treatment options discussed with patient and following plan made:   - He can wear cervical collar for comfort. He should avoid any significant lifting.  - Follow up in 6 weeks with cervical flexion/extension xrays. If these look okay then he can be released.  - His follow up is on 05/08/23 and he will need to be here one hour early to get xrays.   I spent a total of 20 minutes in face-to-face and non-face-to-face activities related to this patient's care today including review of outside records, review of imaging, review of symptoms, physical exam, discussion of differential diagnosis, discussion of treatment options, and documentation.   Thank you for involving me in the care of this patient.   Drake Leach PA-C Dept. of Neurosurgery

## 2023-03-20 ENCOUNTER — Other Ambulatory Visit: Payer: Self-pay | Admitting: Orthopedic Surgery

## 2023-03-20 DIAGNOSIS — S128XXA Fracture of other parts of neck, initial encounter: Secondary | ICD-10-CM

## 2023-03-21 ENCOUNTER — Ambulatory Visit: Payer: Medicare Other | Admitting: Orthopedic Surgery

## 2023-03-21 ENCOUNTER — Ambulatory Visit
Admission: RE | Admit: 2023-03-21 | Discharge: 2023-03-21 | Disposition: A | Discharge status: Home / Self Care | Payer: Medicare Other | Attending: Orthopedic Surgery | Admitting: Orthopedic Surgery

## 2023-03-21 ENCOUNTER — Ambulatory Visit
Admission: RE | Admit: 2023-03-21 | Discharge: 2023-03-21 | Disposition: A | Discharge status: Home / Self Care | Payer: Medicare Other | Source: Ambulatory Visit | Attending: Orthopedic Surgery | Admitting: Orthopedic Surgery

## 2023-03-21 ENCOUNTER — Encounter: Payer: Self-pay | Admitting: Orthopedic Surgery

## 2023-03-21 VITALS — BP 128/64 | Ht 64.0 in | Wt 170.0 lb

## 2023-03-21 DIAGNOSIS — W19XXXA Unspecified fall, initial encounter: Secondary | ICD-10-CM | POA: Diagnosis not present

## 2023-03-21 DIAGNOSIS — M50222 Other cervical disc displacement at C5-C6 level: Secondary | ICD-10-CM

## 2023-03-21 DIAGNOSIS — S12001A Unspecified nondisplaced fracture of first cervical vertebra, initial encounter for closed fracture: Secondary | ICD-10-CM | POA: Diagnosis not present

## 2023-03-21 DIAGNOSIS — S128XXA Fracture of other parts of neck, initial encounter: Secondary | ICD-10-CM | POA: Diagnosis not present

## 2023-03-21 DIAGNOSIS — M47812 Spondylosis without myelopathy or radiculopathy, cervical region: Secondary | ICD-10-CM | POA: Diagnosis not present

## 2023-03-21 DIAGNOSIS — S12000D Unspecified displaced fracture of first cervical vertebra, subsequent encounter for fracture with routine healing: Secondary | ICD-10-CM | POA: Diagnosis not present

## 2023-03-21 DIAGNOSIS — M4312 Spondylolisthesis, cervical region: Secondary | ICD-10-CM | POA: Diagnosis not present

## 2023-04-06 ENCOUNTER — Encounter: Payer: Self-pay | Admitting: Adult Health

## 2023-04-06 ENCOUNTER — Non-Acute Institutional Stay (SKILLED_NURSING_FACILITY): Payer: Medicare Other | Admitting: Adult Health

## 2023-04-06 DIAGNOSIS — S12090D Other displaced fracture of first cervical vertebra, subsequent encounter for fracture with routine healing: Secondary | ICD-10-CM

## 2023-04-06 DIAGNOSIS — R413 Other amnesia: Secondary | ICD-10-CM

## 2023-04-06 DIAGNOSIS — G629 Polyneuropathy, unspecified: Secondary | ICD-10-CM | POA: Diagnosis not present

## 2023-04-06 DIAGNOSIS — D509 Iron deficiency anemia, unspecified: Secondary | ICD-10-CM

## 2023-04-06 DIAGNOSIS — G252 Other specified forms of tremor: Secondary | ICD-10-CM | POA: Diagnosis not present

## 2023-04-06 DIAGNOSIS — K219 Gastro-esophageal reflux disease without esophagitis: Secondary | ICD-10-CM

## 2023-04-06 DIAGNOSIS — I10 Essential (primary) hypertension: Secondary | ICD-10-CM | POA: Diagnosis not present

## 2023-04-06 DIAGNOSIS — E782 Mixed hyperlipidemia: Secondary | ICD-10-CM

## 2023-04-06 MED ORDER — METOPROLOL TARTRATE 50 MG PO TABS
75.0000 mg | ORAL_TABLET | Freq: Every morning | ORAL | Status: DC
Start: 2023-04-06 — End: 2023-05-07

## 2023-04-06 NOTE — Progress Notes (Signed)
Location: [facility].   Provider:  Peggye Ley, ANP [facility] [phone]  Code Status: DNR Goals of Care:     03/02/2023   10:51 AM  Advanced Directives  Does Patient Have a Medical Advance Directive? Yes  Type of Advance Directive Living will;Out of facility DNR (pink MOST or yellow form)  Does patient want to make changes to medical advance directive? No - Patient declined  Copy of Healthcare Power of Attorney in Chart? Yes - validated most recent copy scanned in chart (See row information)  Pre-existing out of facility DNR order (yellow form or pink MOST form) Yellow form placed in chart (order not valid for inpatient use)     Chief Complaint  Patient presents with   Medical Management of Chronic Issues    HPI: Patient is a 89 y.o. male seen today for medical management of chronic diseases.    PMH of CAD, PAD followed with Dr Allyson Sabal IDA, GERD, HTN, PVD and HLD He had a mechanical fall and subsequent severe right hip pain and inability to bear weight, and impacted subcapital femoral neck fracture. He underwent right THA by Dr. Charlann Boxer on 07/11/2022.  He walks with a walker short distances and uses a scooter for long distances. Needs assistance with bathing, dressing. Able to feed himself. Very pleasant. Incontinent intermittently.   Seen in the ED 03/01/23 after a mechanical fall in his wife's apt. He struck his head on plavix. CT of the neck showed a C1 fracture of the left lamina. He is following up with neurosurgery. Denies any pain or numbness or tingling.  He was in a [place] collar. He saw neurosurgery 03/21/23, they recommended changing to a soft cervical collar for comfort. Fx appeared stable. He is following up in October with repeat xrays. He is not having any pain.   He has a tremor with intention, seems slightly worse over time. He denies that it is affecting his QOL or ADls.   He has a hx of urinary retention but reports being able to urinate ok for now.  Hx of hydronephrosis and stent.   Has lumbar stenosis and hx of leg weakness with peripheral neuropathy  BP running 150-160 systolic.  Irbesartan was tried bu the could not tolerate it, was having dizziness. No edema and no sob He was changed from atenolol to metoprolol in August. BP did improve some. HR ranging 58-73  MMSE 26/30 11/2022  Chronic cough on hycodan, hx of cough due to gerd. No lung disease noted per note by pulmonary 2019. ON PPI.   Hx of AAA ICA stenosis and PAD, followed by Dr Allyson Sabal. Last carotid US showing bilateral ICA stenosis with progression. Repeat ordered 12 months.  Renal artery stenosis left 90%. Also has femoral artery stenosis.  Currently denies claudication Past Medical History:  Diagnosis Date   AAA (abdominal aortic aneurysm) (HCC)    asymptomatic   AAA (abdominal aortic aneurysm) (HCC) 2010   peripheral  angiogram-- bilateral SFA DISEASE  and 60 to 70% infrarenaal abd. aortic stenosis with 15 -mm gradient   Adenomatous colon polyp 11/2003   CAD (coronary artery disease)    Gait abnormality 02/13/2017   Gastroparesis    pt denies   GERD (gastroesophageal reflux disease)    w/ LPR   History of kidney stones    x2   Hyperlipidemia    Hypertension    IDA (iron deficiency anemia)    Peripheral arterial disease (HCC)    RCEA  by  Dr [name]   PUD (peptic ulcer disease)    pt unaware   Vertigo     Past Surgical History:  Procedure Laterality Date   ABDOMINAL AORTOGRAM W/LOWER EXTREMITY Bilateral 08/05/2018   Procedure: ABDOMINAL AORTOGRAM W/LOWER EXTREMITY;  Surgeon: Runell Gess, MD;  Location: [facility] INVASIVE CV LAB;  Service: Cardiovascular;  Laterality: Bilateral;   ABDOMINAL AORTOGRAM W/LOWER EXTREMITY N/A 09/26/2021   Procedure: ABDOMINAL AORTOGRAM W/LOWER EXTREMITY;  Surgeon: Runell Gess, MD;  Location: [facility] INVASIVE CV LAB;  Service: Cardiovascular;  Laterality: N/A;   AORTOGRAM  04/24/2016    Abdominal aortogram, bilateral  iliac angiogram, bifemoral runoff   CARDIAC CATHETERIZATION  05/12/2005   RCA   carotid doppler  01/24/2013   RICA endarterectomy,left CCA 0-49%; left bulb and prox ICA 50-69%; bilaateral subclavian < 50%   CAROTID ENDARTERECTOMY  11/01/2010   CATARACT EXTRACTION     COLONOSCOPY     CORONARY ANGIOPLASTY  05/18/2005   2 STENTS distal RCA AND PROXIMAL-MID RCA   5 total stents per pt.   CYSTOSCOPY/URETEROSCOPY/HOLMIUM LASER/STENT PLACEMENT Left 01/11/2018   Procedure: LEFT URETEROSCOPY/HOLMIUM LASER/STENT PLACEMENT;  Surgeon: Crist Fat, MD;  Location: [facility] ORS;  Service: Urology;  Laterality: Left;   DOPPLER ECHOCARDIOGRAPHY  02/07/2012   EF 55%,SHOWED NO ISCHEMIA    HERNIA REPAIR     umbilical   lower arterial  doppler  02/04/2013   aotra 1.5 x 1.5 cm; distal abd aorta 70-99%,proximal common iliac arteries -very stenotic with increased velocities>50%,may be falsely elevated as a result of residual plaque from the distal aorta stenosis   lower extremity doppler  June 18 ,2013   ABI'S ABNORMAL, RABI was 0.88 and LABI 0.75 ,with 3-vessel  run off   NM MYOVIEW LTD  MAY 23,2011   showed no significant ischemia;   NM MYOVIEW LTD  04/22/2008   ef 77%,exercise capcity 6 METS ,exaggerated blood pressure response to exercise   PERIPHERAL VASCULAR CATHETERIZATION N/A 04/24/2016   Procedure: Lower Extremity Angiography;  Surgeon: Runell Gess, MD;  Location: [facility] INVASIVE CV LAB;  Service: Cardiovascular;  Laterality: N/A;   PERIPHERAL VASCULAR CATHETERIZATION  04/24/2016   Procedure: Peripheral Vascular Intervention;  Surgeon: Runell Gess, MD;  Location: [facility] INVASIVE CV LAB;  Service: Cardiovascular;;  Aorta   retrograde central aortic catheterization  05/19/2005   cutting balloon atherectomy, c-circ stenosis with DES STENTING CYPHER   TONSILLECTOMY     TOTAL HIP ARTHROPLASTY Right 07/11/2022   Procedure: TOTAL HIP ARTHROPLASTY ANTERIOR APPROACH;  Surgeon: Durene Romans, MD;  Location: [facility]  ORS;  Service: Orthopedics;  Laterality: Right;   TOTAL KNEE ARTHROPLASTY Left 01/03/2019   Procedure: TOTAL KNEE ARTHROPLASTY;  Surgeon: Beverely Low, MD;  Location: [facility] ORS;  Service: Orthopedics;  Laterality: Left;  with IS block   TRANSURETHRAL RESECTION OF PROSTATE N/A 09/08/2016   Procedure: TRANSURETHRAL RESECTION OF THE PROSTATE (TURP);  Surgeon: Crist Fat, MD;  Location: [facility] ORS;  Service: Urology;  Laterality: N/A;   TRANSURETHRAL RESECTION OF PROSTATE     10-10-17  Dr. Marlou Porch   TRANSURETHRAL RESECTION OF PROSTATE N/A 10/10/2017   Procedure: TRANSURETHRAL RESECTION OF THE PROSTATE (TURP);  Surgeon: Crist Fat, MD;  Location: [facility] ORS;  Service: Urology;  Laterality: N/A;   VASECTOMY      Allergies  Allergen Reactions   Lisinopril Cough   Niaspan [Niacin Er] Rash    Outpatient Encounter Medications as of 04/06/2023  Medication Sig   acetaminophen (TYLENOL) 500 MG tablet  Take 1 tablet (500 mg total) by mouth 2 (two) times daily as needed.   amLODipine (NORVASC) 10 MG tablet Take 10 mg by mouth daily.   Apoaequorin (PREVAGEN PO) Take 1 tablet by mouth daily.   atorvastatin (LIPITOR) 40 MG tablet Take 40 mg by mouth daily at 6 PM.    betamethasone dipropionate (DIPROLENE) 0.05 % ointment Apply topically 2 (two) times daily as needed.   Biotin 10 MG TABS Take 10 mg by mouth daily in the afternoon.   Camphor-Menthol-Methyl Sal (SALONPAS) 3.08-05-08 % PTCH Apply 1 patch topically as needed. Special Instructions: Salonpas patch (on in am, off in pm) as needed to affected area   Carboxymethylcellulose Sodium (ARTIFICIAL TEARS OP) Apply 1 drop to eye 4 (four) times daily.   cetirizine (ZYRTEC) 10 MG tablet Take 1 tablet (10 mg total) by mouth at bedtime.   cholecalciferol (VITAMIN D3) 25 MCG (1000 UT) tablet Take 1,000 Units by mouth daily.   clopidogrel (PLAVIX) 75 MG tablet Take 75 mg by mouth every evening.    Dentifrices (BIOTENE DRY MOUTH CARE DT) Place 2 sprays onto teeth  4 (four) times daily as needed.   finasteride (PROSCAR) 5 MG tablet Take 5 mg by mouth daily.   gabapentin (NEURONTIN) 100 MG capsule Take 100 mg by mouth 3 (three) times daily.   Glucosamine-Chondroitin 750-600 MG TABS Take 1 tablet by mouth 2 (two) times daily.   hydrALAZINE (APRESOLINE) 25 MG tablet Take 25 mg by mouth every 8 (eight) hours as needed.   hydrALAZINE (APRESOLINE) 50 MG tablet Take 1 tablet (50 mg total) by mouth 4 (four) times daily.   hydrochlorothiazide (MICROZIDE) 12.5 MG capsule Take 2 capsules (25 mg total) by mouth daily.   HYDROcodone bit-homatropine (HYCODAN) 5-1.5 MG/5ML syrup Take 5 mLs by mouth at bedtime as needed for cough.   iron polysaccharides (NIFEREX) 150 MG capsule Take 150 mg by mouth every Monday, Wednesday, and Friday.   losartan (COZAAR) 50 MG tablet Take 50 mg by mouth 2 (two) times daily.   melatonin 5 MG TABS Take 1 tablet (5 mg total) by mouth at bedtime.   metoprolol tartrate (LOPRESSOR) 50 MG tablet Take 1.5 tablets (75 mg total) by mouth at bedtime.   metoprolol tartrate (LOPRESSOR) 50 MG tablet Take 1.5 tablets (75 mg total) by mouth in the morning.   Misc Natural Products (GLUCOSAMINE CHONDROITIN TRIPLE) TABS Take 1 tablet by mouth 2 (two) times daily.   Multiple Vitamin (MULTIVITAMIN WITH MINERALS) TABS tablet Take 1 tablet by mouth daily.   ondansetron (ZOFRAN) 4 MG tablet Take 4 mg by mouth every 8 (eight) hours as needed for nausea or vomiting.   pantoprazole (PROTONIX) 40 MG tablet Take 40 mg by mouth 2 (two) times daily.   potassium chloride SA (K-DUR,KLOR-CON) 20 MEQ tablet Take 20 mEq by mouth every evening.    tamsulosin (FLOMAX) 0.4 MG CAPS capsule Take 1 capsule (0.4 mg total) by mouth daily after supper.   [DISCONTINUED] metoprolol tartrate (LOPRESSOR) 50 MG tablet Take 0.5 tablets (25 mg total) by mouth in the morning.   No facility-administered encounter medications on file as of 04/06/2023.    Review of Systems:  Review of  Systems  Constitutional:  Negative for activity change, appetite change, chills, diaphoresis, fatigue, fever and unexpected weight change.  Respiratory:  Positive for cough. Negative for shortness of breath, wheezing and stridor.   Cardiovascular:  Negative for chest pain, palpitations and leg swelling.  Gastrointestinal:  Negative for abdominal  distention, abdominal pain, constipation and diarrhea.  Genitourinary:  Negative for difficulty urinating and dysuria.  Musculoskeletal:  Positive for gait problem and neck pain. Negative for arthralgias, back pain, joint swelling and myalgias.       Leg pain, foot pain  Neurological:  Negative for dizziness, seizures, syncope, facial asymmetry, speech difficulty, weakness and headaches.  Hematological:  Negative for adenopathy. Does not bruise/bleed easily.  Psychiatric/Behavioral:  Negative for agitation, behavioral problems and confusion.     Health Maintenance  Topic Date Due   INFLUENZA VACCINE  03/01/2023   COVID-19 Vaccine (6 - 2023-24 season) 04/01/2023   Pneumonia Vaccine 18+ Years old (2 of 2 - PCV) 08/19/2023 (Originally 09/25/2019)   DTaP/Tdap/Td (2 - Tdap) 09/09/2023   Medicare Annual Wellness (AWV)  11/28/2023   Zoster Vaccines- Shingrix  Completed   HPV VACCINES  Aged Out    Physical Exam: Vitals:   04/06/23 1136  BP: (!) 157/68  Pulse: (!) 58  Resp: 16  Temp: (!) 97.5 F (36.4 C)  Weight: 175 lb (79.4 kg)   Body mass index is 30.04 kg/m. Physical Exam Vitals and nursing note reviewed.  Constitutional:      General: He is not in acute distress.    Appearance: He is not diaphoretic.  HENT:     Head: Normocephalic and atraumatic.     Right Ear: Tympanic membrane normal.     Left Ear: Tympanic membrane normal.     Nose: Nose normal.     Mouth/Throat:     Mouth: Mucous membranes are moist.     Pharynx: Oropharynx is clear.  Eyes:     Conjunctiva/sclera: Conjunctivae normal.     Pupils: Pupils are equal, round, and  reactive to light.  Neck:     Thyroid: No thyromegaly.     Vascular: No JVD.     Trachea: No tracheal deviation.  Cardiovascular:     Rate and Rhythm: Normal rate and regular rhythm.     Heart sounds: No murmur heard. Pulmonary:     Effort: Pulmonary effort is normal. No respiratory distress.     Breath sounds: Normal breath sounds. No wheezing.  Abdominal:     General: Bowel sounds are normal. There is no distension.     Palpations: Abdomen is soft.     Tenderness: There is no abdominal tenderness.  Musculoskeletal:        General: No swelling, tenderness, deformity or signs of injury.     Right lower leg: No edema.     Left lower leg: No edema.     Comments: Strength 5/5 to BUE and BLE.  +CMS to neck and both arms   Lymphadenopathy:     Cervical: No cervical adenopathy.  Skin:    General: Skin is warm and dry.  Neurological:     Mental Status: He is alert and oriented to person, place, and time.     Cranial Nerves: No cranial nerve deficit.     Labs reviewed: Basic Metabolic Panel: Recent Labs    05/13/22 2334 05/13/22 2341 07/11/22 0334 07/12/22 0329 07/12/22 0330 07/13/22 0335 07/20/22 0000 07/27/22 0000 10/17/22 0000 03/01/23 1450  NA  --    < > 142 143 143 145 141 140 142 142  K  --    < > 3.8 4.6 4.6 4.0 4.2 3.9 4.0 4.2  CL  --    < > 108 110 110 115* 107 103 105 107  CO2  --    < >  26 25 24 25  23* 23* 26*  --   GLUCOSE  --    < > 129* 179* 181* 186*  --   --   --  109*  BUN  --    < > 16 26* 24* 33* 33* 30* 21 20  CREATININE  --    < > 0.84 1.32* 1.34* 1.04 1.0 1.5* 1.0 1.10  CALCIUM  --    < > 8.7* 8.7* 8.7* 8.8* 9.0 9.4 9.6  --   MG  --    < > 2.1 2.4  --  2.2  --   --   --   --   PHOS 3.2   < > 3.3  --  3.4 2.8  --   --   --   --   TSH 1.104  --   --   --   --   --   --   --   --   --    < > = values in this interval not displayed.   Liver Function Tests: Recent Labs    05/13/22 1510 05/13/22 2334 05/14/22 0452 07/11/22 0334 07/12/22 0330  07/13/22 0335 10/17/22 0000  AST 22 17 19   --   --   --  16  ALT 21 22 21   --   --   --  15  ALKPHOS 70 66 64  --   --   --  111  BILITOT 0.7 0.9 0.8  --   --   --   --   PROT 6.7 6.3* 6.1*  --   --   --   --   ALBUMIN 3.5 3.3* 3.2*   < > 2.7* 2.9* 4.1   < > = values in this interval not displayed.   No results for input(s): "LIPASE", "AMYLASE" in the last 8760 hours. Recent Labs    05/13/22 2334  AMMONIA 18   CBC: Recent Labs    05/15/22 0613 05/16/22 0110 07/09/22 1450 07/11/22 0334 07/12/22 0329 07/13/22 0335 07/20/22 0000 07/27/22 0000 10/17/22 0000 03/01/23 1450  WBC 7.3 8.7 9.0 10.2 14.4* 16.0* 11.0 15.5 7.6  --   NEUTROABS 5.0 6.4 7.3  --   --   --   --   --   --   --   HGB 13.5 12.6* 12.6* 12.2* 10.5* 9.6* 10.4* 10.5* 12.6* 12.6*  HCT 40.6 38.4* 38.5* 37.6* 32.6* 30.0* 32* 33* 36* 37.0*  MCV 83.2 83.3 87.1 88.3 89.6 89.3  --   --   --   --   PLT 195 189 257 202 201 205 258 296 217  --    Lipid Panel: Recent Labs    10/17/22 0000  CHOL 133  HDL 49  LDLCALC 65  TRIG 122   Lab Results  Component Value Date   HGBA1C 5.5 11/23/2016    Procedures since last visit: DG Cervical Spine 2 or 3 views  Result Date: 03/27/2023 CLINICAL DATA:  Follow-up left C1 lamina fracture. EXAM: CERVICAL SPINE - 2-3 VIEW COMPARISON:  Cervical spine CT dated 03/01/2023. Cervical spine radiographs dated 06/19/2019. FINDINGS: Small superiorly displaced fragments from the left C1 lamina fracture are better visualized today. Multilevel degenerative changes with stable grade 1 anterolisthesis at the C4-5 and C6-7 levels. The majority of the C7 level and below are obscured by the patient's shoulders. Dense left carotid artery atheromatous calcifications are again demonstrated with right carotid artery surgical clips. IMPRESSION: 1. Small superiorly displaced fragments from  the left C1 lamina fracture are better visualized today. 2. Multilevel degenerative changes with stable grade 1  anterolisthesis at the C4-5 and C6-7 levels. 3. Dense left carotid artery atheromatous calcifications. Electronically Signed   By: Beckie Salts M.D.   On: 03/27/2023 22:11    Assessment/Plan  1. Other closed displaced fracture of first cervical vertebra, initial encounter (HCC) Continue soft collar prn until apt with neurosurgery  2. Essential hypertension Above goal  Increase metoprolol to 75 mg bid  3. Peptic ulcer With GERD Discussed with nurse that med needs to be given 30 min to 1 hr prior ta meal.  Continue protonix  4. Iron deficiency anemia, unspecified iron deficiency anemia type Lab Results  Component Value Date   HGB 12.6 (L) 03/01/2023  Continue Niferex   5. Mixed hyperlipidemia Lab Results  Component Value Date   LDLCALC 65 10/17/2022   Continue lipitor    6. Neuropathy Pain is controlled with neurontin  5. Memory loss MIld and progressing Appropriate for skilled care Would not likely benefit from additional meds.   6. Iron deficiency anemia, unspecified iron deficiency anemia type Continue ferrous sulfate.   7. Mixed hyperlipidemia Continue to monitor.   8. Coarse tremors Has some mild rigidity, slightly parkinsonian Mild and not affecting his ADls  9. Chronic ischemic heart disease Followed by cardiology ON plavix and statin.

## 2023-04-27 ENCOUNTER — Other Ambulatory Visit: Payer: Self-pay | Admitting: Adult Health

## 2023-04-27 DIAGNOSIS — R053 Chronic cough: Secondary | ICD-10-CM

## 2023-04-27 MED ORDER — HYDROCODONE BIT-HOMATROP MBR 5-1.5 MG/5ML PO SOLN: 5.0000 mL | Freq: Every evening | ORAL | 0 refills | Status: DC | PRN

## 2023-05-04 NOTE — Progress Notes (Deleted)
Referring Physician:  Mahlon Gammon, MD [address] [place],  [place] [address]  Primary Physician:  Mahlon Gammon, MD  History of Present Illness: 05/04/2023 Nathan Santiago has a history of urinary retention, peripheral neuropathy, HTN, AAA, PAD, CAD, GERD, and hyperlipidemia.   Last seen by me on 03/21/23 for left lamina fracture of C1 s/p fall on 03/01/23. He is resident at [facility] in [place].   This is a stable fracture- he was advised to wear collar for comfort. He is here for repeat xrays.       He has no neck pain. No arm pain. No numbness, tingling, or weakness. He hates the cervical collar- it is bothering him.   He is on PLAVIX.    Review of Systems:  A 10 point review of systems is negative, except for the pertinent positives and negatives detailed in the HPI.  Past Medical History: Past Medical History:  Diagnosis Date   AAA (abdominal aortic aneurysm) (HCC)    asymptomatic   AAA (abdominal aortic aneurysm) (HCC) 2010   peripheral  angiogram-- bilateral SFA DISEASE  and 60 to 70% infrarenaal abd. aortic stenosis with 15 -mm gradient   Adenomatous colon polyp 11/2003   CAD (coronary artery disease)    Gait abnormality 02/13/2017   Gastroparesis    pt denies   GERD (gastroesophageal reflux disease)    w/ LPR   History of kidney stones    x2   Hyperlipidemia    Hypertension    IDA (iron deficiency anemia)    Peripheral arterial disease (HCC)    RCEA  by Dr [name]   PUD (peptic ulcer disease)    pt unaware   Vertigo     Past Surgical History: Past Surgical History:  Procedure Laterality Date   ABDOMINAL AORTOGRAM W/LOWER EXTREMITY Bilateral 08/05/2018   Procedure: ABDOMINAL AORTOGRAM W/LOWER EXTREMITY;  Surgeon: Runell Gess, MD;  Location: [facility] INVASIVE CV [facility];  Service: Cardiovascular;  Laterality: Bilateral;   ABDOMINAL AORTOGRAM W/LOWER EXTREMITY N/A 09/26/2021   Procedure: ABDOMINAL AORTOGRAM W/LOWER EXTREMITY;  Surgeon:  Runell Gess, MD;  Location: [facility] CV [facility];  Service: Cardiovascular;  Laterality: N/A;   AORTOGRAM  04/24/2016    Abdominal aortogram, bilateral iliac angiogram, bifemoral runoff   CARDIAC CATHETERIZATION  05/12/2005   RCA   carotid doppler  01/24/2013   RICA endarterectomy,left CCA 0-49%; left bulb and prox ICA 50-69%; bilaateral subclavian < 50%   CAROTID ENDARTERECTOMY  11/01/2010   CATARACT EXTRACTION     COLONOSCOPY     CORONARY ANGIOPLASTY  05/18/2005   2 STENTS distal RCA AND PROXIMAL-MID RCA   5 total stents per pt.   CYSTOSCOPY/URETEROSCOPY/HOLMIUM LASER/STENT PLACEMENT Left 01/11/2018   Procedure: LEFT URETEROSCOPY/HOLMIUM LASER/STENT PLACEMENT;  Surgeon: Crist Fat, MD;  Location: [facility];  Service: Urology;  Laterality: Left;   DOPPLER ECHOCARDIOGRAPHY  02/07/2012   EF 55%,SHOWED NO ISCHEMIA    HERNIA REPAIR     umbilical   lower arterial  doppler  02/04/2013   aotra 1.5 x 1.5 cm; distal abd aorta 70-99%,proximal common iliac arteries -very stenotic with increased velocities>50%,may be falsely elevated as a result of residual plaque from the distal aorta stenosis   lower extremity doppler  June 18 ,2013   ABI'S ABNORMAL, RABI was 0.88 and LABI 0.75 ,with 3-vessel  run off   NM MYOVIEW LTD  MAY 23,2011   showed no significant ischemia;   NM MYOVIEW LTD  04/22/2008  ef 77%,exercise capcity 6 METS ,exaggerated blood pressure response to exercise   PERIPHERAL VASCULAR CATHETERIZATION N/A 04/24/2016   Procedure: Lower Extremity Angiography;  Surgeon: Runell Gess, MD;  Location: [facility] INVASIVE CV LAB;  Service: Cardiovascular;  Laterality: N/A;   PERIPHERAL VASCULAR CATHETERIZATION  04/24/2016   Procedure: Peripheral Vascular Intervention;  Surgeon: Runell Gess, MD;  Location: [facility] INVASIVE CV LAB;  Service: Cardiovascular;;  Aorta   retrograde central aortic catheterization  05/19/2005   cutting balloon atherectomy, c-circ stenosis with DES STENTING CYPHER    TONSILLECTOMY     TOTAL HIP ARTHROPLASTY Right 07/11/2022   Procedure: TOTAL HIP ARTHROPLASTY ANTERIOR APPROACH;  Surgeon: Durene Romans, MD;  Location: [facility];  Service: Orthopedics;  Laterality: Right;   TOTAL KNEE ARTHROPLASTY Left 01/03/2019   Procedure: TOTAL KNEE ARTHROPLASTY;  Surgeon: Beverely Low, MD;  Location: [facility];  Service: Orthopedics;  Laterality: Left;  with IS block   TRANSURETHRAL RESECTION OF PROSTATE N/A 09/08/2016   Procedure: TRANSURETHRAL RESECTION OF THE PROSTATE (TURP);  Surgeon: Crist Fat, MD;  Location: [facility];  Service: Urology;  Laterality: N/A;   TRANSURETHRAL RESECTION OF PROSTATE     10-10-17  Dr. Marlou Porch   TRANSURETHRAL RESECTION OF PROSTATE N/A 10/10/2017   Procedure: TRANSURETHRAL RESECTION OF THE PROSTATE (TURP);  Surgeon: Crist Fat, MD;  Location: [facility];  Service: Urology;  Laterality: N/A;   VASECTOMY      Allergies: Allergies as of 05/08/2023 - Review Complete 04/06/2023  Allergen Reaction Noted   Lisinopril Cough    Niaspan [niacin er] Rash 04/11/2011    Medications: Outpatient Encounter Medications as of 05/08/2023  Medication Sig   acetaminophen (TYLENOL) 500 MG tablet Take 1 tablet (500 mg total) by mouth 2 (two) times daily as needed.   amLODipine (NORVASC) 10 MG tablet Take 10 mg by mouth daily.   Apoaequorin (PREVAGEN PO) Take 1 tablet by mouth daily.   atorvastatin (LIPITOR) 40 MG tablet Take 40 mg by mouth daily at 6 PM.    Biotin 10 MG TABS Take 10 mg by mouth daily in the afternoon.   cetirizine (ZYRTEC) 10 MG tablet Take 1 tablet (10 mg total) by mouth at bedtime.   cholecalciferol (VITAMIN D3) 25 MCG (1000 UT) tablet Take 1,000 Units by mouth daily.   clopidogrel (PLAVIX) 75 MG tablet Take 75 mg by mouth every evening.    finasteride (PROSCAR) 5 MG tablet Take 5 mg by mouth daily.   gabapentin (NEURONTIN) 100 MG capsule Take 100 mg by mouth 3 (three) times daily.   Glucosamine-Chondroitin 750-600 MG TABS Take 1  tablet by mouth 2 (two) times daily.   hydrALAZINE (APRESOLINE) 25 MG tablet Take 25 mg by mouth every 8 (eight) hours as needed.   hydrALAZINE (APRESOLINE) 50 MG tablet Take 1 tablet (50 mg total) by mouth 4 (four) times daily.   hydrochlorothiazide (MICROZIDE) 12.5 MG capsule Take 2 capsules (25 mg total) by mouth daily.   HYDROcodone bit-homatropine (HYCODAN) 5-1.5 MG/5ML syrup Take 5 mLs by mouth at bedtime as needed for cough.   iron polysaccharides (NIFEREX) 150 MG capsule Take 150 mg by mouth every Monday, Wednesday, and Friday.   losartan (COZAAR) 50 MG tablet Take 50 mg by mouth 2 (two) times daily.   melatonin 5 MG TABS Take 1 tablet (5 mg total) by mouth at bedtime.   metoprolol tartrate (LOPRESSOR) 50 MG tablet Take 1.5 tablets (75 mg total) by mouth at bedtime.   metoprolol tartrate (LOPRESSOR)  50 MG tablet Take 1.5 tablets (75 mg total) by mouth in the morning.   Multiple Vitamin (MULTIVITAMIN WITH MINERALS) TABS tablet Take 1 tablet by mouth daily.   ondansetron (ZOFRAN) 4 MG tablet Take 4 mg by mouth every 8 (eight) hours as needed for nausea or vomiting.   pantoprazole (PROTONIX) 40 MG tablet Take 40 mg by mouth 2 (two) times daily.   potassium chloride SA (K-DUR,KLOR-CON) 20 MEQ tablet Take 20 mEq by mouth every evening.    tamsulosin (FLOMAX) 0.4 MG CAPS capsule Take 1 capsule (0.4 mg total) by mouth daily after supper.   No facility-administered encounter medications on file as of 05/08/2023.    Social History: Social History   Tobacco Use   Smoking status: Former    Current packs/day: 0.00    Average packs/day: 2.0 packs/day for 20.0 years (40.0 ttl pk-yrs)    Types: Cigarettes    Start date: 07/31/1948    Quit date: 07/31/1968    Years since quitting: 54.7   Smokeless tobacco: Never  Vaping Use   Vaping status: Never Used  Substance Use Topics   Alcohol use: Not Currently    Alcohol/week: 4.0 standard drinks of alcohol    Types: 4 Glasses of wine per week   Drug  use: Never    Family Medical History: Family History  Problem Relation Age of Onset   Ovarian cancer Sister    Heart disease Father    Brain cancer Brother        Tumor    Physical Examination: There were no vitals filed for this visit.    Awake, alert, oriented to person, place, and time.  Speech is clear and fluent. Fund of knowledge is appropriate.   Cranial Nerves: Pupils equal round and reactive to light.  Facial tone is symmetric.    He has no posterior cervical tenderness.  He has mild bilateral trapezial tenderness.   No abnormal lesions on exposed skin.   Strength: Side Biceps Triceps Deltoid Interossei Grip Wrist Ext. Wrist Flex.  R 5 5 5 5 5 5 5   L 5 5 5 5 5 5 5    Side Iliopsoas Quads Hamstring PF DF EHL  R 5 5 5 5 5 5   L 5 5 5 5 5 5    Reflexes are 2+ and symmetric at the biceps, brachioradialis, patella and achilles.   Hoffman's is absent.  Clonus is not present.   Bilateral upper and lower extremity sensation is intact to light touch.     Gait not tested. He is in a WC.   Medical Decision Making  Imaging: Cervical xrays dated ***:  Difficult to visualize C1 fracture. Cervical spondylosis with DDD C5-C6. ***  Radiology report not yet available.   Assessment and Plan: Mr. Nathan Santiago is a pleasant 89 y.o. male was seen in ED at [facility] on 03/01/23 after fall and was found to have left lamina fracture of C1.   He has no neck pain. No arm pain. No numbness, tingling, or weakness.   Xrays from today show cervical spondylosis and DDD C5-C6. Difficult to visualize fracture. Reviewed CT previously with Dr. Myer Haff and this is a stable fracture.   Treatment options discussed with patient and following plan made:   - He can wear cervical collar for comfort. He should avoid any significant lifting.  - Follow up in 6 weeks with cervical flexion/extension xrays. If these look okay then he can be released.  - His follow up is on 05/08/23  and he will need to be here  one hour early to get xrays.   I spent a total of 20 minutes in face-to-face and non-face-to-face activities related to this patient's care today including review of outside records, review of imaging, review of symptoms, physical exam, discussion of differential diagnosis, discussion of treatment options, and documentation.   Thank you for involving me in the care of this patient.   Drake Leach PA-C Dept. of Neurosurgery

## 2023-05-07 ENCOUNTER — Encounter: Payer: Self-pay | Admitting: Internal Medicine

## 2023-05-07 ENCOUNTER — Non-Acute Institutional Stay (SKILLED_NURSING_FACILITY): Payer: Medicare Other | Admitting: Internal Medicine

## 2023-05-07 DIAGNOSIS — D509 Iron deficiency anemia, unspecified: Secondary | ICD-10-CM | POA: Diagnosis not present

## 2023-05-07 DIAGNOSIS — R269 Unspecified abnormalities of gait and mobility: Secondary | ICD-10-CM

## 2023-05-07 DIAGNOSIS — K279 Peptic ulcer, site unspecified, unspecified as acute or chronic, without hemorrhage or perforation: Secondary | ICD-10-CM | POA: Diagnosis not present

## 2023-05-07 DIAGNOSIS — I10 Essential (primary) hypertension: Secondary | ICD-10-CM | POA: Diagnosis not present

## 2023-05-07 DIAGNOSIS — S12090D Other displaced fracture of first cervical vertebra, subsequent encounter for fracture with routine healing: Secondary | ICD-10-CM | POA: Diagnosis not present

## 2023-05-07 DIAGNOSIS — G629 Polyneuropathy, unspecified: Secondary | ICD-10-CM | POA: Diagnosis not present

## 2023-05-07 DIAGNOSIS — G3184 Mild cognitive impairment, so stated: Secondary | ICD-10-CM

## 2023-05-07 DIAGNOSIS — F5101 Primary insomnia: Secondary | ICD-10-CM | POA: Diagnosis not present

## 2023-05-07 DIAGNOSIS — K219 Gastro-esophageal reflux disease without esophagitis: Secondary | ICD-10-CM | POA: Diagnosis not present

## 2023-05-07 DIAGNOSIS — R053 Chronic cough: Secondary | ICD-10-CM | POA: Diagnosis not present

## 2023-05-07 DIAGNOSIS — E782 Mixed hyperlipidemia: Secondary | ICD-10-CM

## 2023-05-07 NOTE — Progress Notes (Unsigned)
[facility] Room Number: 149-A Place of Service:  SNF [phone]) Provider:  Merian Capron    Patient Care Team: Mahlon Gammon, MD as PCP - General (Internal Medicine) Runell Gess, MD as PCP - Cardiology (Cardiology)  Extended Emergency Contact Information Primary Emergency Contact: [name] Address: [address]          [place], [place] [address] [place] of [place] Home Phone: [phone] Mobile Phone: [phone] Relation: Spouse  Code Status:  DNR Goals of care: Advanced Directive information    05/07/2023   12:37 PM  Advanced Directives  Does Patient Have a Medical Advance Directive? Yes  Type of Estate agent of [place];Living will;Out of facility DNR (pink MOST or yellow form)  Does patient want to make changes to medical advance directive? No - Patient declined  Copy of Healthcare Power of Attorney in Chart? Yes - validated most recent copy scanned in chart (See row information)     Chief Complaint  Patient presents with   Medical Management of Chronic Issues    Routine Visit.    Immunizations    Discuss the need for Influenza vaccine, and Covid Booster.     HPI:  Pt is a 89 y.o. male seen today for medical management of chronic diseases.    Lives in SNF in [place] Due to help with his ADLS and recurrent falls [place] in [place] and He is stays with  her during daytime  Acute issue Hypertension Patient BP runs Higher especially in the morning Nurses also said he is missing the 11 Am Dose of Hydralazine as he is with his wife  He had Mechanical Fall in her apartment on 03/01/23 resulting in C1 Neck Fracture of Left Lamina Now wearing Soft Collar PRN for Comfort Has Follow up with them in 10/08  He also Has h/o   hyperlipidemia, PAD, CAD h/o Urinary Retention with Hydronephrosis and stent   Hx of AAA ICA stenosis and PAD, followed by Dr Allyson Sabal. Last carotid US showing  bilateral ICA stenosis   Mild Cognitive impairment MMSE 27/30 in 03/24 S/p THA in 12/23  Continues to do well otherwise Walks with his walker Uses Power chair for long Distance Has Gained weight  Wt Readings from Last 3 Encounters:  05/07/23 181 lb (82.1 kg)  04/06/23 175 lb (79.4 kg)  03/21/23 170 lb (77.1 kg)      Past Medical History:  Diagnosis Date   AAA (abdominal aortic aneurysm) (HCC)    asymptomatic   AAA (abdominal aortic aneurysm) (HCC) 2010   peripheral  angiogram-- bilateral SFA DISEASE  and 60 to 70% infrarenaal abd. aortic stenosis with 15 -mm gradient   Adenomatous colon polyp 11/2003   CAD (coronary artery disease)    Gait abnormality 02/13/2017   Gastroparesis    pt denies   GERD (gastroesophageal reflux disease)    w/ LPR   History of kidney stones    x2   Hyperlipidemia    Hypertension    IDA (iron deficiency anemia)    Peripheral arterial disease (HCC)    RCEA  by Dr [name]   PUD (peptic ulcer disease)    pt unaware   Vertigo    Past Surgical History:  Procedure Laterality Date   ABDOMINAL AORTOGRAM W/LOWER EXTREMITY Bilateral 08/05/2018   Procedure: ABDOMINAL AORTOGRAM W/LOWER EXTREMITY;  Surgeon: Runell Gess, MD;  Location: [facility] INVASIVE CV LAB;  Service: Cardiovascular;  Laterality: Bilateral;  ABDOMINAL AORTOGRAM W/LOWER EXTREMITY N/A 09/26/2021   Procedure: ABDOMINAL AORTOGRAM W/LOWER EXTREMITY;  Surgeon: Runell Gess, MD;  Location: [facility] INVASIVE CV LAB;  Service: Cardiovascular;  Laterality: N/A;   AORTOGRAM  04/24/2016    Abdominal aortogram, bilateral iliac angiogram, bifemoral runoff   CARDIAC CATHETERIZATION  05/12/2005   RCA   carotid doppler  01/24/2013   RICA endarterectomy,left CCA 0-49%; left bulb and prox ICA 50-69%; bilaateral subclavian < 50%   CAROTID ENDARTERECTOMY  11/01/2010   CATARACT EXTRACTION     COLONOSCOPY     CORONARY ANGIOPLASTY  05/18/2005   2 STENTS distal RCA AND PROXIMAL-MID RCA   5 total stents  per pt.   CYSTOSCOPY/URETEROSCOPY/HOLMIUM LASER/STENT PLACEMENT Left 01/11/2018   Procedure: LEFT URETEROSCOPY/HOLMIUM LASER/STENT PLACEMENT;  Surgeon: Crist Fat, MD;  Location: [facility];  Service: Urology;  Laterality: Left;   DOPPLER ECHOCARDIOGRAPHY  02/07/2012   EF 55%,SHOWED NO ISCHEMIA    HERNIA REPAIR     umbilical   lower arterial  doppler  02/04/2013   aotra 1.5 x 1.5 cm; distal abd aorta 70-99%,proximal common iliac arteries -very stenotic with increased velocities>50%,may be falsely elevated as a result of residual plaque from the distal aorta stenosis   lower extremity doppler  June 18 ,2013   ABI'S ABNORMAL, RABI was 0.88 and LABI 0.75 ,with 3-vessel  run off   NM MYOVIEW LTD  MAY 23,2011   showed no significant ischemia;   NM MYOVIEW LTD  04/22/2008   ef 77%,exercise capcity 6 METS ,exaggerated blood pressure response to exercise   PERIPHERAL VASCULAR CATHETERIZATION N/A 04/24/2016   Procedure: Lower Extremity Angiography;  Surgeon: Runell Gess, MD;  Location: [facility] INVASIVE CV LAB;  Service: Cardiovascular;  Laterality: N/A;   PERIPHERAL VASCULAR CATHETERIZATION  04/24/2016   Procedure: Peripheral Vascular Intervention;  Surgeon: Runell Gess, MD;  Location: [facility] INVASIVE CV LAB;  Service: Cardiovascular;;  Aorta   retrograde central aortic catheterization  05/19/2005   cutting balloon atherectomy, c-circ stenosis with DES STENTING CYPHER   TONSILLECTOMY     TOTAL HIP ARTHROPLASTY Right 07/11/2022   Procedure: TOTAL HIP ARTHROPLASTY ANTERIOR APPROACH;  Surgeon: Durene Romans, MD;  Location: [facility];  Service: Orthopedics;  Laterality: Right;   TOTAL KNEE ARTHROPLASTY Left 01/03/2019   Procedure: TOTAL KNEE ARTHROPLASTY;  Surgeon: Beverely Low, MD;  Location: [facility];  Service: Orthopedics;  Laterality: Left;  with IS block   TRANSURETHRAL RESECTION OF PROSTATE N/A 09/08/2016   Procedure: TRANSURETHRAL RESECTION OF THE PROSTATE (TURP);  Surgeon: Crist Fat, MD;   Location: [facility];  Service: Urology;  Laterality: N/A;   TRANSURETHRAL RESECTION OF PROSTATE     10-10-17  Dr. Marlou Porch   TRANSURETHRAL RESECTION OF PROSTATE N/A 10/10/2017   Procedure: TRANSURETHRAL RESECTION OF THE PROSTATE (TURP);  Surgeon: Crist Fat, MD;  Location: [facility];  Service: Urology;  Laterality: N/A;   VASECTOMY      Allergies  Allergen Reactions   Lisinopril Cough   Niaspan [Niacin Er] Rash    Allergies as of 05/07/2023       Reactions   Lisinopril Cough   Niaspan [niacin Er] Rash        Medication List        Accurate as of May 07, 2023 12:37 PM. If you have any questions, ask your nurse or doctor.          acetaminophen 500 MG tablet Commonly known as: TYLENOL Take 1 tablet (500 mg total) by  mouth 2 (two) times daily as needed.   amLODipine 10 MG tablet Commonly known as: NORVASC Take 10 mg by mouth daily.   atorvastatin 40 MG tablet Commonly known as: LIPITOR Take 40 mg by mouth daily at 6 PM.   Biotin 10 MG Tabs Take 10 mg by mouth daily in the afternoon.   cetirizine 10 MG tablet Commonly known as: ZYRTEC Take 1 tablet (10 mg total) by mouth at bedtime.   cholecalciferol 25 MCG (1000 UNIT) tablet Commonly known as: VITAMIN D3 Take 1,000 Units by mouth daily.   clopidogrel 75 MG tablet Commonly known as: PLAVIX Take 75 mg by mouth every evening.   finasteride 5 MG tablet Commonly known as: PROSCAR Take 5 mg by mouth daily.   gabapentin 100 MG capsule Commonly known as: NEURONTIN Take 100 mg by mouth 3 (three) times daily.   Glucosamine-Chondroitin 750-600 MG Tabs Take 1 tablet by mouth 2 (two) times daily.   hydrALAZINE 25 MG tablet Commonly known as: APRESOLINE Take 25 mg by mouth every 8 (eight) hours as needed.   hydrALAZINE 50 MG tablet Commonly known as: APRESOLINE Take 1 tablet (50 mg total) by mouth 4 (four) times daily.   hydrochlorothiazide 12.5 MG capsule Commonly known as: MICROZIDE Take 2 capsules  (25 mg total) by mouth daily.   HYDROcodone bit-homatropine 5-1.5 MG/5ML syrup Commonly known as: HYCODAN Take 5 mLs by mouth at bedtime as needed for cough.   iron polysaccharides 150 MG capsule Commonly known as: NIFEREX Take 150 mg by mouth every Monday, Wednesday, and Friday.   losartan 50 MG tablet Commonly known as: COZAAR Take 50 mg by mouth 2 (two) times daily.   melatonin 5 MG Tabs Take 1 tablet (5 mg total) by mouth at bedtime.   metoprolol tartrate 50 MG tablet Commonly known as: LOPRESSOR Take 75 mg by mouth 2 (two) times daily. What changed: Another medication with the same name was removed. Continue taking this medication, and follow the directions you see here. Changed by: Mahlon Gammon   multivitamin with minerals Tabs tablet Take 1 tablet by mouth daily.   ondansetron 4 MG tablet Commonly known as: ZOFRAN Take 4 mg by mouth every 6 (six) hours as needed for nausea or vomiting.   pantoprazole 40 MG tablet Commonly known as: PROTONIX Take 40 mg by mouth 2 (two) times daily.   potassium chloride SA 20 MEQ tablet Commonly known as: KLOR-CON M Take 20 mEq by mouth every evening.   PREVAGEN PO Take 1 tablet by mouth daily.   tamsulosin 0.4 MG Caps capsule Commonly known as: FLOMAX Take 1 capsule (0.4 mg total) by mouth daily after supper.        Review of Systems  Constitutional:  Negative for activity change, appetite change and unexpected weight change.  HENT: Negative.    Respiratory:  Negative for cough and shortness of breath.   Cardiovascular:  Negative for leg swelling.  Gastrointestinal:  Negative for constipation.  Genitourinary:  Negative for frequency.  Musculoskeletal:  Positive for gait problem. Negative for arthralgias and myalgias.  Skin: Negative.  Negative for rash.  Neurological:  Negative for dizziness and weakness.  Psychiatric/Behavioral:  Negative for confusion and sleep disturbance.   All other systems reviewed and are  negative.   Immunization History  Administered Date(s) Administered   Fluad Quad(high Dose 65+) 05/02/2022   Influenza, High Dose Seasonal PF 03/31/2017, 03/29/2018   Influenza,inj,Quad PF,6+ Mos 04/25/2016, 05/06/2018   Influenza,inj,quad, With Preservative 05/26/2019  Influenza-Unspecified 05/14/2001, 05/31/2001, 05/14/2002, 05/15/2002, 05/18/2003, 05/25/2004, 04/30/2005, 05/29/2006, 05/01/2007, 05/11/2008, 05/31/2009, 05/31/2010, 04/30/2013, 05/31/2014, 03/31/2021   Moderna Covid-19 Vaccine Bivalent Booster 66yrs & up 08/31/2021   Moderna SARS-COV2 Booster Vaccination 06/15/2020, 11/19/2020, 01/02/2022   Moderna Sars-Covid-2 Vaccination 08/12/2019, 09/10/2019, 05/31/2022   Pfizer Covid-19 Vaccine Bivalent Booster 1yrs & up 11/23/2022   Pneumococcal Polysaccharide-23 05/02/2000, 09/24/2018   Rsv, Bivalent, Protein Subunit Rsvpref,pf Verdis Frederickson) 07/19/2022   Td 09/08/2013   Zoster Recombinant(Shingrix) 10/04/2017, 01/23/2018   Pertinent  Health Maintenance Due  Topic Date Due   INFLUENZA VACCINE  03/01/2023      07/12/2022    7:10 PM 07/13/2022    7:10 AM 07/28/2022    9:00 AM 10/14/2022    7:21 AM 11/28/2022    2:50 PM  Fall Risk  Falls in the past year?    1 1  Was there an injury with Fall?    1 0  Fall Risk Category Calculator    3 1  (RETIRED) Patient Fall Risk Level High fall risk High fall risk High fall risk    Patient at Risk for Falls Due to     History of fall(s);Impaired balance/gait;Impaired mobility  Fall risk Follow up    Falls evaluation completed Falls evaluation completed;Education provided;Falls prevention discussed   Functional Status Survey:    Vitals:   05/07/23 1230  BP: (!) 189/60  Pulse: 68  Resp: 16  Temp: 97.7 F (36.5 C)  SpO2: 94%  Weight: 181 lb (82.1 kg)  Height: 5\' 4"  (1.626 m)   Body mass index is 31.07 kg/m. Physical Exam Vitals reviewed.  Constitutional:      Appearance: Normal appearance.  HENT:     Head: Normocephalic.      Nose: Nose normal.     Mouth/Throat:     Mouth: Mucous membranes are moist.     Pharynx: Oropharynx is clear.  Eyes:     Pupils: Pupils are equal, round, and reactive to light.  Cardiovascular:     Rate and Rhythm: Normal rate and regular rhythm.     Pulses: Normal pulses.     Heart sounds: No murmur heard. Pulmonary:     Effort: Pulmonary effort is normal. No respiratory distress.     Breath sounds: Normal breath sounds. No rales.  Abdominal:     General: Abdomen is flat. Bowel sounds are normal.     Palpations: Abdomen is soft.  Musculoskeletal:     Cervical back: Neck supple.     Comments: Mild Swelling Bilateral  Skin:    General: Skin is warm.  Neurological:     General: No focal deficit present.     Mental Status: He is alert and oriented to person, place, and time.  Psychiatric:        Mood and Affect: Mood normal.        Thought Content: Thought content normal.     Labs reviewed: Recent Labs    07/11/22 0334 07/12/22 0329 07/12/22 0330 07/13/22 0335 07/20/22 0000 07/27/22 0000 10/17/22 0000 03/01/23 1450  NA 142 143 143 145 141 140 142 142  K 3.8 4.6 4.6 4.0 4.2 3.9 4.0 4.2  CL 108 110 110 115* 107 103 105 107  CO2 26 25 24 25  23* 23* 26*  --   GLUCOSE 129* 179* 181* 186*  --   --   --  109*  BUN 16 26* 24* 33* 33* 30* 21 20  CREATININE 0.84 1.32* 1.34* 1.04 1.0 1.5* 1.0 1.10  CALCIUM 8.7* 8.7* 8.7* 8.8* 9.0 9.4 9.6  --   MG 2.1 2.4  --  2.2  --   --   --   --   PHOS 3.3  --  3.4 2.8  --   --   --   --    Recent Labs    05/13/22 1510 05/13/22 2334 05/14/22 0452 07/11/22 0334 07/12/22 0330 07/13/22 0335 10/17/22 0000  AST 22 17 19   --   --   --  16  ALT 21 22 21   --   --   --  15  ALKPHOS 70 66 64  --   --   --  111  BILITOT 0.7 0.9 0.8  --   --   --   --   PROT 6.7 6.3* 6.1*  --   --   --   --   ALBUMIN 3.5 3.3* 3.2*   < > 2.7* 2.9* 4.1   < > = values in this interval not displayed.   Recent Labs    05/15/22 0613 05/16/22 0110  07/09/22 1450 07/11/22 0334 07/12/22 0329 07/13/22 0335 07/20/22 0000 07/27/22 0000 10/17/22 0000 03/01/23 1450  WBC 7.3 8.7 9.0 10.2 14.4* 16.0* 11.0 15.5 7.6  --   NEUTROABS 5.0 6.4 7.3  --   --   --   --   --   --   --   HGB 13.5 12.6* 12.6* 12.2* 10.5* 9.6* 10.4* 10.5* 12.6* 12.6*  HCT 40.6 38.4* 38.5* 37.6* 32.6* 30.0* 32* 33* 36* 37.0*  MCV 83.2 83.3 87.1 88.3 89.6 89.3  --   --   --   --   PLT 195 189 257 202 201 205 258 296 217  --    Lab Results  Component Value Date   TSH 1.104 05/13/2022   Lab Results  Component Value Date   HGBA1C 5.5 11/23/2016   Lab Results  Component Value Date   CHOL 133 10/17/2022   HDL 49 10/17/2022   LDLCALC 65 10/17/2022   TRIG 122 10/17/2022   CHOLHDL 2.4 11/23/2016    Significant Diagnostic Results in last 30 days:  No results found.  Assessment/Plan 1. Essential hypertension Will change his Hydralazine to TID for Compliance 75 mg BID and 50 mg in afternoon Did not tolerate Irbesartan Back on Losartan Also on Norvasc and HCTZ 2. Chronic cough On Hydrocodone Syrup  3. CAD s/p Stents Follows with Cardiology On Statin and Plavix  4. Neuropathy Doing well With Gabapentin  5. MCI (mild cognitive impairment) Doing well in SNF  6. Peptic ulcer Chronic Protonix  7. Iron deficiency anemia, unspecified iron deficiency anemia type On Iron HGB in good levels  8. Primary insomnia Low Dose of melatonin  9. Mixed hyperlipidemia On statin LDL good in 3/24  10. Other closed displaced fracture of first cervical vertebra with routine healing, subsequent encounter Doing well now Following with Neurosurgery  11. Gait abnormality Continue Using his walker 12 BPH On Proscar and Flomax   Family/ staff Communication:   Labs/tests ordered:

## 2023-05-08 ENCOUNTER — Encounter: Payer: Self-pay | Admitting: Internal Medicine

## 2023-05-08 ENCOUNTER — Ambulatory Visit: Payer: Medicare Other | Admitting: Orthopedic Surgery

## 2023-05-24 DIAGNOSIS — M6281 Muscle weakness (generalized): Secondary | ICD-10-CM | POA: Diagnosis not present

## 2023-05-24 DIAGNOSIS — M6389 Disorders of muscle in diseases classified elsewhere, multiple sites: Secondary | ICD-10-CM | POA: Diagnosis not present

## 2023-05-24 DIAGNOSIS — R278 Other lack of coordination: Secondary | ICD-10-CM | POA: Diagnosis not present

## 2023-05-25 NOTE — Progress Notes (Addendum)
Referring Physician:  Mahlon Gammon, MD [address] [place],  [place] [address]  Primary Physician:  Mahlon Gammon, MD  History of Present Illness: 05/29/2023 Mr. Seamon Brueggemann has a history of urinary retention, peripheral neuropathy, HTN, AAA, PAD, CAD, GERD, and hyperlipidemia.   Last seen by me on 03/21/23 for left lamina fracture of C1 s/p fall on 03/01/23. He is resident at [facility] in [place].   This is a stable fracture- he was advised to wear collar for comfort. He is here for repeat xrays.   He has intermittent neck soreness on both the right and left side. No radiation into his shoulders or into his arms. No numbness, tingling, or weakness.   He is on PLAVIX.    Review of Systems:  A 10 point review of systems is negative, except for the pertinent positives and negatives detailed in the HPI.  Past Medical History: Past Medical History:  Diagnosis Date   AAA (abdominal aortic aneurysm) (HCC)    asymptomatic   AAA (abdominal aortic aneurysm) (HCC) 2010   peripheral  angiogram-- bilateral SFA DISEASE  and 60 to 70% infrarenaal abd. aortic stenosis with 15 -mm gradient   Adenomatous colon polyp 11/2003   CAD (coronary artery disease)    Gait abnormality 02/13/2017   Gastroparesis    pt denies   GERD (gastroesophageal reflux disease)    w/ LPR   History of kidney stones    x2   Hyperlipidemia    Hypertension    IDA (iron deficiency anemia)    Peripheral arterial disease (HCC)    RCEA  by Dr [name]   PUD (peptic ulcer disease)    pt unaware   Vertigo     Past Surgical History: Past Surgical History:  Procedure Laterality Date   ABDOMINAL AORTOGRAM W/LOWER EXTREMITY Bilateral 08/05/2018   Procedure: ABDOMINAL AORTOGRAM W/LOWER EXTREMITY;  Surgeon: Runell Gess, MD;  Location: [facility];  Service: Cardiovascular;  Laterality: Bilateral;   ABDOMINAL AORTOGRAM W/LOWER EXTREMITY N/A 09/26/2021   Procedure: ABDOMINAL AORTOGRAM W/LOWER  EXTREMITY;  Surgeon: Runell Gess, MD;  Location: [facility];  Service: Cardiovascular;  Laterality: N/A;   AORTOGRAM  04/24/2016    Abdominal aortogram, bilateral iliac angiogram, bifemoral runoff   CARDIAC CATHETERIZATION  05/12/2005   RCA   carotid doppler  01/24/2013   RICA endarterectomy,left CCA 0-49%; left bulb and prox ICA 50-69%; bilaateral subclavian < 50%   CAROTID ENDARTERECTOMY  11/01/2010   CATARACT EXTRACTION     COLONOSCOPY     CORONARY ANGIOPLASTY  05/18/2005   2 STENTS distal RCA AND PROXIMAL-MID RCA   5 total stents per pt.   CYSTOSCOPY/URETEROSCOPY/HOLMIUM LASER/STENT PLACEMENT Left 01/11/2018   Procedure: LEFT URETEROSCOPY/HOLMIUM LASER/STENT PLACEMENT;  Surgeon: Crist Fat, MD;  Location: [facility];  Service: Urology;  Laterality: Left;   DOPPLER ECHOCARDIOGRAPHY  02/07/2012   EF 55%,SHOWED NO ISCHEMIA    HERNIA REPAIR     umbilical   lower arterial  doppler  02/04/2013   aotra 1.5 x 1.5 cm; distal abd aorta 70-99%,proximal common iliac arteries -very stenotic with increased velocities>50%,may be falsely elevated as a result of residual plaque from the distal aorta stenosis   lower extremity doppler  June 18 ,2013   ABI'S ABNORMAL, RABI was 0.88 and LABI 0.75 ,with 3-vessel  run off   NM MYOVIEW LTD  MAY 23,2011   showed no significant ischemia;   NM MYOVIEW LTD  04/22/2008  ef 77%,exercise capcity 6 METS ,exaggerated blood pressure response to exercise   PERIPHERAL VASCULAR CATHETERIZATION N/A 04/24/2016   Procedure: Lower Extremity Angiography;  Surgeon: Runell Gess, MD;  Location: [facility] INVASIVE CV LAB;  Service: Cardiovascular;  Laterality: N/A;   PERIPHERAL VASCULAR CATHETERIZATION  04/24/2016   Procedure: Peripheral Vascular Intervention;  Surgeon: Runell Gess, MD;  Location: [facility] INVASIVE CV LAB;  Service: Cardiovascular;;  Aorta   retrograde central aortic catheterization  05/19/2005   cutting balloon atherectomy, c-circ stenosis with DES  STENTING CYPHER   TONSILLECTOMY     TOTAL HIP ARTHROPLASTY Right 07/11/2022   Procedure: TOTAL HIP ARTHROPLASTY ANTERIOR APPROACH;  Surgeon: Durene Romans, MD;  Location: [facility];  Service: Orthopedics;  Laterality: Right;   TOTAL KNEE ARTHROPLASTY Left 01/03/2019   Procedure: TOTAL KNEE ARTHROPLASTY;  Surgeon: Beverely Low, MD;  Location: [facility];  Service: Orthopedics;  Laterality: Left;  with IS block   TRANSURETHRAL RESECTION OF PROSTATE N/A 09/08/2016   Procedure: TRANSURETHRAL RESECTION OF THE PROSTATE (TURP);  Surgeon: Crist Fat, MD;  Location: [facility];  Service: Urology;  Laterality: N/A;   TRANSURETHRAL RESECTION OF PROSTATE     10-10-17  Dr. Marlou Porch   TRANSURETHRAL RESECTION OF PROSTATE N/A 10/10/2017   Procedure: TRANSURETHRAL RESECTION OF THE PROSTATE (TURP);  Surgeon: Crist Fat, MD;  Location: [facility];  Service: Urology;  Laterality: N/A;   VASECTOMY      Allergies: Allergies as of 05/29/2023 - Review Complete 05/29/2023  Allergen Reaction Noted   Lisinopril Cough    Niaspan [niacin er (antihyperlipidemic)] Rash 04/11/2011    Medications: Outpatient Encounter Medications as of 05/29/2023  Medication Sig   NYAMYC powder Apply 1 Application topically daily.   acetaminophen (TYLENOL) 500 MG tablet Take 1 tablet (500 mg total) by mouth 2 (two) times daily as needed.   amLODipine (NORVASC) 10 MG tablet Take 10 mg by mouth daily.   Apoaequorin (PREVAGEN PO) Take 1 tablet by mouth daily.   atorvastatin (LIPITOR) 40 MG tablet Take 40 mg by mouth daily at 6 PM.    Biotin 10 MG TABS Take 10 mg by mouth daily in the afternoon.   cetirizine (ZYRTEC) 10 MG tablet Take 1 tablet (10 mg total) by mouth at bedtime.   cholecalciferol (VITAMIN D3) 25 MCG (1000 UT) tablet Take 1,000 Units by mouth daily.   clopidogrel (PLAVIX) 75 MG tablet Take 75 mg by mouth every evening.    finasteride (PROSCAR) 5 MG tablet Take 5 mg by mouth daily.   gabapentin (NEURONTIN) 100 MG capsule  Take 100 mg by mouth 3 (three) times daily.   Glucosamine-Chondroitin 750-600 MG TABS Take 1 tablet by mouth 2 (two) times daily.   hydrALAZINE (APRESOLINE) 25 MG tablet Take 25 mg by mouth every 8 (eight) hours as needed.   hydrALAZINE (APRESOLINE) 50 MG tablet Take by mouth. 75 mg AM and HS 50 mg in afternood   hydrochlorothiazide (MICROZIDE) 12.5 MG capsule Take 2 capsules (25 mg total) by mouth daily.   HYDROcodone bit-homatropine (HYCODAN) 5-1.5 MG/5ML syrup Take 5 mLs by mouth at bedtime as needed for cough.   iron polysaccharides (NIFEREX) 150 MG capsule Take 150 mg by mouth every Monday, Wednesday, and Friday.   losartan (COZAAR) 50 MG tablet Take 50 mg by mouth 2 (two) times daily.   melatonin 5 MG TABS Take 1 tablet (5 mg total) by mouth at bedtime.   metoprolol tartrate (LOPRESSOR) 50 MG tablet Take 75 mg by mouth  2 (two) times daily.   Multiple Vitamin (MULTIVITAMIN WITH MINERALS) TABS tablet Take 1 tablet by mouth daily.   ondansetron (ZOFRAN) 4 MG tablet Take 4 mg by mouth every 6 (six) hours as needed for nausea or vomiting.   pantoprazole (PROTONIX) 40 MG tablet Take 40 mg by mouth 2 (two) times daily.   potassium chloride SA (K-DUR,KLOR-CON) 20 MEQ tablet Take 20 mEq by mouth every evening.    tamsulosin (FLOMAX) 0.4 MG CAPS capsule Take 1 capsule (0.4 mg total) by mouth daily after supper.   No facility-administered encounter medications on file as of 05/29/2023.    Social History: Social History   Tobacco Use   Smoking status: Former    Current packs/day: 0.00    Average packs/day: 2.0 packs/day for 20.0 years (40.0 ttl pk-yrs)    Types: Cigarettes    Start date: 07/31/1948    Quit date: 07/31/1968    Years since quitting: 54.8   Smokeless tobacco: Never  Vaping Use   Vaping status: Never Used  Substance Use Topics   Alcohol use: Not Currently    Alcohol/week: 4.0 standard drinks of alcohol    Types: 4 Glasses of wine per week   Drug use: Never    Family  Medical History: Family History  Problem Relation Age of Onset   Ovarian cancer Sister    Heart disease Father    Brain cancer Brother        Tumor    Physical Examination: Vitals:   05/29/23 1424 05/29/23 1511  BP: (!) 140/50 (!) 140/52      Awake, alert, oriented to person, place, and time.  Speech is clear and fluent. Fund of knowledge is appropriate.   Cranial Nerves: Pupils equal round and reactive to light.  Facial tone is symmetric.    He has no posterior cervical tenderness.  He has mild bilateral trapezial tenderness.   No abnormal lesions on exposed skin.   Strength: Side Biceps Triceps Deltoid Interossei Grip Wrist Ext. Wrist Flex.  R 5 5 5 5 5 5 5   L 5 5 5 5 5 5 5    Side Iliopsoas Quads Hamstring PF DF EHL  R 5 5 5 5 5 5   L 5 5 5 5 5 5    Reflexes are 2+ and symmetric at the biceps, brachioradialis, patella and achilles.   Hoffman's is absent.  Clonus is not present.   Bilateral upper and lower extremity sensation is intact to light touch.     Gait not tested. He is in a motorized scooter.    Medical Decision Making  Imaging: Cervical xrays dated 05/29/23:  Difficult to visualize C1 fracture. Cervical spondylosis with DDD C5-C6. Slight slip C4-C5 that does not appear to be unstable on flexion/extension views.   Radiology report not yet available.   Assessment and Plan: Nathan Santiago is a pleasant 89 y.o. male was seen in ED at [facility] on 03/01/23 after fall and was found to have left lamina fracture of C1.   He has only intermittent soreness in his neck. No radiation of pain into shoulders or his arms. No numbness, tingling, or weakness.   Xrays from today show cervical spondylosis and DDD C5-C6. Difficult to visualize fracture. No apparent instability seen.   Treatment options discussed with patient and following plan made:   - He can wear cervical collar for comfort. He should avoid any significant lifting.  - Will call his wife Nelva Bush and sent note  back to facility once  I have final read of the xrays. He likely will be able to follow up prn.   BP was 140/50 and then 140/52. He feels well with no symptoms. Recommend they recheck it at SNF when he gets back.   I spent a total of 20 minutes in face-to-face and non-face-to-face activities related to this patient's care today including review of outside records, review of imaging, review of symptoms, physical exam, discussion of differential diagnosis, discussion of treatment options, and documentation.   ADDENDUM 07/02/23:  Cervical xrays dated 05/29/23:  FINDINGS: Patient's known C1 fracture is not well seen. No new fractures are identified. There is stable 2 mm of anterolisthesis at C4-C5. Alignment is otherwise anatomic. There is disc space narrowing and endplate osteophyte formation at C5-C6 compatible with degenerative change similar to the prior study. There is no prevertebral soft tissue swelling. There surgical clips in the soft tissues of the right neck.   IMPRESSION: 1. Patient's known C1 fracture is not well seen. No new fractures are identified. 2. Stable 2 mm of anterolisthesis at C4-C5.     Electronically Signed   By: Darliss Cheney M.D.   On: 06/22/2023 23:40  I have personally reviewed the images and agree with the above interpretation.  Above xrays reviewed with Dr. Katrinka Blazing. No instability noted at C1-C2. He can stop wearing cervical collar and can follow up prn. Will send copy of this note to his SNF and call his wife Nelva Bush to let her know as well.   Drake Leach PA-C Dept. of Neurosurgery

## 2023-05-29 ENCOUNTER — Ambulatory Visit
Admission: RE | Admit: 2023-05-29 | Discharge: 2023-05-29 | Disposition: A | Discharge status: Home / Self Care | Payer: Medicare Other | Source: Ambulatory Visit | Attending: Orthopedic Surgery | Admitting: Orthopedic Surgery

## 2023-05-29 ENCOUNTER — Ambulatory Visit: Payer: Medicare Other | Admitting: Orthopedic Surgery

## 2023-05-29 ENCOUNTER — Encounter: Payer: Self-pay | Admitting: Orthopedic Surgery

## 2023-05-29 VITALS — BP 140/52 | Wt 168.0 lb

## 2023-05-29 DIAGNOSIS — S12001A Unspecified nondisplaced fracture of first cervical vertebra, initial encounter for closed fracture: Secondary | ICD-10-CM | POA: Diagnosis not present

## 2023-05-29 DIAGNOSIS — M47812 Spondylosis without myelopathy or radiculopathy, cervical region: Secondary | ICD-10-CM | POA: Diagnosis not present

## 2023-05-29 DIAGNOSIS — S12000A Unspecified displaced fracture of first cervical vertebra, initial encounter for closed fracture: Secondary | ICD-10-CM | POA: Diagnosis not present

## 2023-05-29 DIAGNOSIS — S12001D Unspecified nondisplaced fracture of first cervical vertebra, subsequent encounter for fracture with routine healing: Secondary | ICD-10-CM

## 2023-05-29 DIAGNOSIS — W19XXXD Unspecified fall, subsequent encounter: Secondary | ICD-10-CM | POA: Diagnosis not present

## 2023-05-29 DIAGNOSIS — M4802 Spinal stenosis, cervical region: Secondary | ICD-10-CM | POA: Diagnosis not present

## 2023-05-29 DIAGNOSIS — M4312 Spondylolisthesis, cervical region: Secondary | ICD-10-CM | POA: Diagnosis not present

## 2023-05-29 DIAGNOSIS — M50322 Other cervical disc degeneration at C5-C6 level: Secondary | ICD-10-CM

## 2023-05-29 DIAGNOSIS — S128XXD Fracture of other parts of neck, subsequent encounter: Secondary | ICD-10-CM

## 2023-05-31 ENCOUNTER — Non-Acute Institutional Stay (SKILLED_NURSING_FACILITY): Payer: Medicare Other | Admitting: Adult Health

## 2023-05-31 ENCOUNTER — Encounter: Payer: Self-pay | Admitting: Adult Health

## 2023-05-31 DIAGNOSIS — I10 Essential (primary) hypertension: Secondary | ICD-10-CM | POA: Diagnosis not present

## 2023-05-31 MED ORDER — CLONIDINE HCL 0.1 MG PO TABS
0.1000 mg | ORAL_TABLET | Freq: Every day | ORAL | 3 refills | Status: DC
Start: 2023-05-31 — End: 2023-06-08

## 2023-05-31 NOTE — Progress Notes (Signed)
Location:  Medical illustrator of Service:  SNF (31) Provider:   Peggye Ley, ANP [facility] [phone]   Mahlon Gammon, MD  Patient Care Team: Mahlon Gammon, MD as PCP - General (Internal Medicine) Runell Gess, MD as PCP - Cardiology (Cardiology)  Extended Emergency Contact Information Primary Emergency Contact: [name] Address: [address]          [place], [place] [address] [place] of [place] Home Phone: [phone] Mobile Phone: [phone] Relation: Spouse  Code Status:  DNR Goals of care: Advanced Directive information    05/07/2023   12:37 PM  Advanced Directives  Does Patient Have a Medical Advance Directive? Yes  Type of Estate agent of [place];Living will;Out of facility DNR (pink MOST or yellow form)  Does patient want to make changes to medical advance directive? No - Patient declined  Copy of Healthcare Power of Attorney in Chart? Yes - validated most recent copy scanned in chart (See row information)     Chief Complaint  Patient presents with   Acute Visit    HTN    HPI:  Pt is a 89 y.o. male seen today for an acute visit for HTN  BPs reviewed in matrix.   Blood Pressure: 173 / 67 mmHg  Blood Pressure: 171 / 63 mmHg   Blood Pressure: 187 / 67 mmHg  Blood Pressure: 147 / 53 mmHg  Pt denies any dizziness, cp, or sob. No change in weight or diet.  Hx of AAA ICA stenosis and PAD, followed by Dr Allyson Sabal. Last carotid US showing bilateral ICA stenosis with progression. Repeat ordered 12 months.  Renal artery stenosis left 90%. Also has femoral artery stenosis.   Past Medical History:  Diagnosis Date   AAA (abdominal aortic aneurysm) (HCC)    asymptomatic   AAA (abdominal aortic aneurysm) (HCC) 2010   peripheral  angiogram-- bilateral SFA DISEASE  and 60 to 70% infrarenaal abd. aortic stenosis with 15 -mm gradient   Adenomatous colon polyp 11/2003   CAD  (coronary artery disease)    Gait abnormality 02/13/2017   Gastroparesis    pt denies   GERD (gastroesophageal reflux disease)    w/ LPR   History of kidney stones    x2   Hyperlipidemia    Hypertension    IDA (iron deficiency anemia)    Peripheral arterial disease (HCC)    RCEA  by Dr [name]   PUD (peptic ulcer disease)    pt unaware   Vertigo    Past Surgical History:  Procedure Laterality Date   ABDOMINAL AORTOGRAM W/LOWER EXTREMITY Bilateral 08/05/2018   Procedure: ABDOMINAL AORTOGRAM W/LOWER EXTREMITY;  Surgeon: Runell Gess, MD;  Location: [facility] INVASIVE CV LAB;  Service: Cardiovascular;  Laterality: Bilateral;   ABDOMINAL AORTOGRAM W/LOWER EXTREMITY N/A 09/26/2021   Procedure: ABDOMINAL AORTOGRAM W/LOWER EXTREMITY;  Surgeon: Runell Gess, MD;  Location: [facility] INVASIVE CV LAB;  Service: Cardiovascular;  Laterality: N/A;   AORTOGRAM  04/24/2016    Abdominal aortogram, bilateral iliac angiogram, bifemoral runoff   CARDIAC CATHETERIZATION  05/12/2005   RCA   carotid doppler  01/24/2013   RICA endarterectomy,left CCA 0-49%; left bulb and prox ICA 50-69%; bilaateral subclavian < 50%   CAROTID ENDARTERECTOMY  11/01/2010   CATARACT EXTRACTION     COLONOSCOPY     CORONARY ANGIOPLASTY  05/18/2005   2 STENTS distal RCA AND PROXIMAL-MID RCA   5 total stents per pt.  CYSTOSCOPY/URETEROSCOPY/HOLMIUM LASER/STENT PLACEMENT Left 01/11/2018   Procedure: LEFT URETEROSCOPY/HOLMIUM LASER/STENT PLACEMENT;  Surgeon: Crist Fat, MD;  Location: [facility];  Service: Urology;  Laterality: Left;   DOPPLER ECHOCARDIOGRAPHY  02/07/2012   EF 55%,SHOWED NO ISCHEMIA    HERNIA REPAIR     umbilical   lower arterial  doppler  02/04/2013   aotra 1.5 x 1.5 cm; distal abd aorta 70-99%,proximal common iliac arteries -very stenotic with increased velocities>50%,may be falsely elevated as a result of residual plaque from the distal aorta stenosis   lower extremity doppler  June 18 ,2013   ABI'S  ABNORMAL, RABI was 0.88 and LABI 0.75 ,with 3-vessel  run off   NM MYOVIEW LTD  MAY 23,2011   showed no significant ischemia;   NM MYOVIEW LTD  04/22/2008   ef 77%,exercise capcity 6 METS ,exaggerated blood pressure response to exercise   PERIPHERAL VASCULAR CATHETERIZATION N/A 04/24/2016   Procedure: Lower Extremity Angiography;  Surgeon: Runell Gess, MD;  Location: [facility] INVASIVE CV LAB;  Service: Cardiovascular;  Laterality: N/A;   PERIPHERAL VASCULAR CATHETERIZATION  04/24/2016   Procedure: Peripheral Vascular Intervention;  Surgeon: Runell Gess, MD;  Location: [facility] INVASIVE CV LAB;  Service: Cardiovascular;;  Aorta   retrograde central aortic catheterization  05/19/2005   cutting balloon atherectomy, c-circ stenosis with DES STENTING CYPHER   TONSILLECTOMY     TOTAL HIP ARTHROPLASTY Right 07/11/2022   Procedure: TOTAL HIP ARTHROPLASTY ANTERIOR APPROACH;  Surgeon: Durene Romans, MD;  Location: [facility];  Service: Orthopedics;  Laterality: Right;   TOTAL KNEE ARTHROPLASTY Left 01/03/2019   Procedure: TOTAL KNEE ARTHROPLASTY;  Surgeon: Beverely Low, MD;  Location: [facility];  Service: Orthopedics;  Laterality: Left;  with IS block   TRANSURETHRAL RESECTION OF PROSTATE N/A 09/08/2016   Procedure: TRANSURETHRAL RESECTION OF THE PROSTATE (TURP);  Surgeon: Crist Fat, MD;  Location: [facility];  Service: Urology;  Laterality: N/A;   TRANSURETHRAL RESECTION OF PROSTATE     10-10-17  Dr. Marlou Porch   TRANSURETHRAL RESECTION OF PROSTATE N/A 10/10/2017   Procedure: TRANSURETHRAL RESECTION OF THE PROSTATE (TURP);  Surgeon: Crist Fat, MD;  Location: [facility];  Service: Urology;  Laterality: N/A;   VASECTOMY      Allergies  Allergen Reactions   Lisinopril Cough   Niaspan [Niacin Er (Antihyperlipidemic)] Rash    Outpatient Encounter Medications as of 05/31/2023  Medication Sig   cloNIDine (CATAPRES) 0.1 MG tablet Take 1 tablet (0.1 mg total) by mouth at bedtime.   acetaminophen (TYLENOL) 500  MG tablet Take 1 tablet (500 mg total) by mouth 2 (two) times daily as needed.   amLODipine (NORVASC) 10 MG tablet Take 10 mg by mouth daily.   Apoaequorin (PREVAGEN PO) Take 1 tablet by mouth daily.   atorvastatin (LIPITOR) 40 MG tablet Take 40 mg by mouth daily at 6 PM.    Biotin 10 MG TABS Take 10 mg by mouth daily in the afternoon.   cetirizine (ZYRTEC) 10 MG tablet Take 1 tablet (10 mg total) by mouth at bedtime.   cholecalciferol (VITAMIN D3) 25 MCG (1000 UT) tablet Take 1,000 Units by mouth daily.   clopidogrel (PLAVIX) 75 MG tablet Take 75 mg by mouth every evening.    finasteride (PROSCAR) 5 MG tablet Take 5 mg by mouth daily.   gabapentin (NEURONTIN) 100 MG capsule Take 100 mg by mouth 3 (three) times daily.   Glucosamine-Chondroitin 750-600 MG TABS Take 1 tablet by mouth 2 (two) times daily.  hydrALAZINE (APRESOLINE) 25 MG tablet Take 25 mg by mouth every 8 (eight) hours as needed.   hydrALAZINE (APRESOLINE) 50 MG tablet Take by mouth. 75 mg AM and HS 50 mg in afternood   hydrochlorothiazide (MICROZIDE) 12.5 MG capsule Take 2 capsules (25 mg total) by mouth daily.   HYDROcodone bit-homatropine (HYCODAN) 5-1.5 MG/5ML syrup Take 5 mLs by mouth at bedtime as needed for cough.   iron polysaccharides (NIFEREX) 150 MG capsule Take 150 mg by mouth every Monday, Wednesday, and Friday.   losartan (COZAAR) 50 MG tablet Take 50 mg by mouth 2 (two) times daily.   melatonin 5 MG TABS Take 1 tablet (5 mg total) by mouth at bedtime.   metoprolol tartrate (LOPRESSOR) 50 MG tablet Take 75 mg by mouth 2 (two) times daily.   Multiple Vitamin (MULTIVITAMIN WITH MINERALS) TABS tablet Take 1 tablet by mouth daily.   NYAMYC powder Apply 1 Application topically daily.   ondansetron (ZOFRAN) 4 MG tablet Take 4 mg by mouth every 6 (six) hours as needed for nausea or vomiting.   pantoprazole (PROTONIX) 40 MG tablet Take 40 mg by mouth 2 (two) times daily.   potassium chloride SA (K-DUR,KLOR-CON) 20 MEQ  tablet Take 20 mEq by mouth every evening.    tamsulosin (FLOMAX) 0.4 MG CAPS capsule Take 1 capsule (0.4 mg total) by mouth daily after supper.   No facility-administered encounter medications on file as of 05/31/2023.    Review of Systems  Constitutional:  Negative for activity change, appetite change, chills, diaphoresis, fatigue, fever and unexpected weight change.  Respiratory:  Negative for cough, shortness of breath, wheezing and stridor.   Cardiovascular:  Positive for leg swelling. Negative for chest pain and palpitations.  Gastrointestinal:  Negative for abdominal distention, abdominal pain, constipation and diarrhea.  Genitourinary:  Negative for difficulty urinating and dysuria.  Musculoskeletal:  Positive for gait problem. Negative for arthralgias, back pain, joint swelling and myalgias.  Neurological:  Negative for dizziness, seizures, syncope, facial asymmetry, speech difficulty, weakness and headaches.  Hematological:  Negative for adenopathy. Does not bruise/bleed easily.  Psychiatric/Behavioral:  Negative for agitation, behavioral problems and confusion.     Immunization History  Administered Date(s) Administered   Fluad Quad(high Dose 65+) 05/02/2022   Influenza, High Dose Seasonal PF 03/31/2017, 03/29/2018   Influenza,inj,Quad PF,6+ Mos 04/25/2016, 05/06/2018   Influenza,inj,quad, With Preservative 05/26/2019   Influenza-Unspecified 05/14/2001, 05/31/2001, 05/14/2002, 05/15/2002, 05/18/2003, 05/25/2004, 04/30/2005, 05/29/2006, 05/01/2007, 05/11/2008, 05/31/2009, 05/31/2010, 04/30/2013, 05/31/2014, 03/31/2021   Moderna Covid-19 Vaccine Bivalent Booster 29yrs & up 08/31/2021   Moderna SARS-COV2 Booster Vaccination 06/15/2020, 11/19/2020, 01/02/2022   Moderna Sars-Covid-2 Vaccination 08/12/2019, 09/10/2019, 05/31/2022   Pfizer Covid-19 Vaccine Bivalent Booster 48yrs & up 11/23/2022   Pneumococcal Polysaccharide-23 05/02/2000, 09/24/2018   Rsv, Bivalent, Protein Subunit  Rsvpref,pf Verdis Frederickson) 07/19/2022   Td 09/08/2013   Zoster Recombinant(Shingrix) 10/04/2017, 01/23/2018   Pertinent  Health Maintenance Due  Topic Date Due   INFLUENZA VACCINE  03/01/2023      07/12/2022    7:10 PM 07/13/2022    7:10 AM 07/28/2022    9:00 AM 10/14/2022    7:21 AM 11/28/2022    2:50 PM  Fall Risk  Falls in the past year?    1 1  Was there an injury with Fall?    1 0  Fall Risk Category Calculator    3 1  (RETIRED) Patient Fall Risk Level High fall risk High fall risk High fall risk    Patient at Risk for  Falls Due to     History of fall(s);Impaired balance/gait;Impaired mobility  Fall risk Follow up    Falls evaluation completed Falls evaluation completed;Education provided;Falls prevention discussed   Functional Status Survey:    Vitals:   05/31/23 1609  BP: (!) 173/67  Pulse: 67  Resp: 17  Temp: (!) 97.5 F (36.4 C)  SpO2: 97%  Weight: 181 lb (82.1 kg)   Body mass index is 31.07 kg/m. Physical Exam Constitutional:      General: He is not in acute distress.    Appearance: He is not diaphoretic.  HENT:     Head: Normocephalic and atraumatic.  Neck:     Thyroid: No thyromegaly.     Vascular: No JVD.     Trachea: No tracheal deviation.  Cardiovascular:     Rate and Rhythm: Normal rate and regular rhythm.     Heart sounds: No murmur heard. Pulmonary:     Effort: Pulmonary effort is normal. No respiratory distress.     Breath sounds: Normal breath sounds. No wheezing.  Abdominal:     General: Bowel sounds are normal. There is no distension.     Palpations: Abdomen is soft.     Tenderness: There is no abdominal tenderness.  Musculoskeletal:     Comments: Pitting BLE +1 edema  Lymphadenopathy:     Cervical: No cervical adenopathy.  Skin:    General: Skin is warm and dry.  Neurological:     General: No focal deficit present.     Mental Status: He is alert. Mental status is at baseline.     Labs reviewed: Recent Labs    07/11/22 0334  07/12/22 0329 07/12/22 0330 07/13/22 0335 07/13/22 0335 07/20/22 0000 07/27/22 0000 10/17/22 0000 03/01/23 1450  NA 142 143 143 145   < > 141 140 142 142  K 3.8 4.6 4.6 4.0  --  4.2 3.9 4.0 4.2  CL 108 110 110 115*  --  107 103 105 107  CO2 26 25 24 25   --  23* 23* 26*  --   GLUCOSE 129* 179* 181* 186*  --   --   --   --  109*  BUN 16 26* 24* 33*   < > 33* 30* 21 20  CREATININE 0.84 1.32* 1.34* 1.04   < > 1.0 1.5* 1.0 1.10  CALCIUM 8.7* 8.7* 8.7* 8.8*  --  9.0 9.4 9.6  --   MG 2.1 2.4  --  2.2  --   --   --   --   --   PHOS 3.3  --  3.4 2.8  --   --   --   --   --    < > = values in this interval not displayed.   Recent Labs    07/12/22 0330 07/13/22 0335 10/17/22 0000  AST  --   --  16  ALT  --   --  15  ALKPHOS  --   --  111  ALBUMIN 2.7* 2.9* 4.1   Recent Labs    07/09/22 1450 07/11/22 0334 07/12/22 0329 07/13/22 0335 07/20/22 0000 07/27/22 0000 10/17/22 0000 03/01/23 1450  WBC 9.0 10.2 14.4* 16.0* 11.0 15.5 7.6  --   NEUTROABS 7.3  --   --   --   --   --   --   --   HGB 12.6* 12.2* 10.5* 9.6* 10.4* 10.5* 12.6* 12.6*  HCT 38.5* 37.6* 32.6* 30.0* 32* 33* 36* 37.0*  MCV  87.1 88.3 89.6 89.3  --   --   --   --   PLT 257 202 201 205 258 296 217  --    Lab Results  Component Value Date   TSH 1.104 05/13/2022   Lab Results  Component Value Date   HGBA1C 5.5 11/23/2016   Lab Results  Component Value Date   CHOL 133 10/17/2022   HDL 49 10/17/2022   LDLCALC 65 10/17/2022   TRIG 122 10/17/2022   CHOLHDL 2.4 11/23/2016    Significant Diagnostic Results in last 30 days:  No results found.  Assessment/Plan 1. Essential hypertension   - cloNIDine (CATAPRES) 0.1 MG tablet; Take 1 tablet (0.1 mg total) by mouth at bedtime.  Dispense: 30 tablet; Refill: 3 -discussed rising slowly and calling for help before getting up due fall risk.  -followed by cardiology    Family/ staff Communication: nurse  Labs/tests ordered:  recommend BMP at next routine visit

## 2023-06-01 DIAGNOSIS — R278 Other lack of coordination: Secondary | ICD-10-CM | POA: Diagnosis not present

## 2023-06-01 DIAGNOSIS — M6281 Muscle weakness (generalized): Secondary | ICD-10-CM | POA: Diagnosis not present

## 2023-06-01 DIAGNOSIS — M6389 Disorders of muscle in diseases classified elsewhere, multiple sites: Secondary | ICD-10-CM | POA: Diagnosis not present

## 2023-06-04 ENCOUNTER — Encounter: Payer: Self-pay | Admitting: Internal Medicine

## 2023-06-04 ENCOUNTER — Non-Acute Institutional Stay (SKILLED_NURSING_FACILITY): Payer: Medicare Other | Admitting: Internal Medicine

## 2023-06-04 DIAGNOSIS — R21 Rash and other nonspecific skin eruption: Secondary | ICD-10-CM

## 2023-06-04 DIAGNOSIS — M6389 Disorders of muscle in diseases classified elsewhere, multiple sites: Secondary | ICD-10-CM | POA: Diagnosis not present

## 2023-06-04 DIAGNOSIS — M6281 Muscle weakness (generalized): Secondary | ICD-10-CM | POA: Diagnosis not present

## 2023-06-04 DIAGNOSIS — R278 Other lack of coordination: Secondary | ICD-10-CM | POA: Diagnosis not present

## 2023-06-04 NOTE — Progress Notes (Signed)
Location: [facility] Room Number: 149A Place of Service:  SNF [phone])  Provider: Mahlon Gammon, MD   Code Status: DNR Goals of Care:     06/04/2023    4:59 PM  Advanced Directives  Does Patient Have a Medical Advance Directive? Yes  Type of Estate agent of [place];Living will;Out of facility DNR (pink MOST or yellow form)  Does patient want to make changes to medical advance directive? No - Patient declined  Copy of Healthcare Power of Attorney in Chart? Yes - validated most recent copy scanned in chart (See row information)     Chief Complaint  Patient presents with   Acute Visit    Patient is being seen for rash     HPI: Patient is a 89 y.o. male seen today for an acute visit for Scrotal Rash  Lives in SNF in [place] Due to help with his ADLS and recurrent falls     Seen for Scrotal rash Not better inspite of Nystatin Powder Has been Itching causing skin to bleed  Past Medical History:  Diagnosis Date   AAA (abdominal aortic aneurysm) (HCC)    asymptomatic   AAA (abdominal aortic aneurysm) (HCC) 2010   peripheral  angiogram-- bilateral SFA DISEASE  and 60 to 70% infrarenaal abd. aortic stenosis with 15 -mm gradient   Adenomatous colon polyp 11/2003   CAD (coronary artery disease)    Gait abnormality 02/13/2017   Gastroparesis    pt denies   GERD (gastroesophageal reflux disease)    w/ LPR   History of kidney stones    x2   Hyperlipidemia    Hypertension    IDA (iron deficiency anemia)    Peripheral arterial disease (HCC)    RCEA  by Dr [name]   PUD (peptic ulcer disease)    pt unaware   Vertigo     Past Surgical History:  Procedure Laterality Date   ABDOMINAL AORTOGRAM W/LOWER EXTREMITY Bilateral 08/05/2018   Procedure: ABDOMINAL AORTOGRAM W/LOWER EXTREMITY;  Surgeon: Runell Gess, MD;  Location: [facility];  Service: Cardiovascular;  Laterality: Bilateral;   ABDOMINAL AORTOGRAM  W/LOWER EXTREMITY N/A 09/26/2021   Procedure: ABDOMINAL AORTOGRAM W/LOWER EXTREMITY;  Surgeon: Runell Gess, MD;  Location: [facility];  Service: Cardiovascular;  Laterality: N/A;   AORTOGRAM  04/24/2016    Abdominal aortogram, bilateral iliac angiogram, bifemoral runoff   CARDIAC CATHETERIZATION  05/12/2005   RCA   carotid doppler  01/24/2013   RICA endarterectomy,left CCA 0-49%; left bulb and prox ICA 50-69%; bilaateral subclavian < 50%   CAROTID ENDARTERECTOMY  11/01/2010   CATARACT EXTRACTION     COLONOSCOPY     CORONARY ANGIOPLASTY  05/18/2005   2 STENTS distal RCA AND PROXIMAL-MID RCA   5 total stents per pt.   CYSTOSCOPY/URETEROSCOPY/HOLMIUM LASER/STENT PLACEMENT Left 01/11/2018   Procedure: LEFT URETEROSCOPY/HOLMIUM LASER/STENT PLACEMENT;  Surgeon: Crist Fat, MD;  Location: [facility];  Service: Urology;  Laterality: Left;   DOPPLER ECHOCARDIOGRAPHY  02/07/2012   EF 55%,SHOWED NO ISCHEMIA    HERNIA REPAIR     umbilical   lower arterial  doppler  02/04/2013   aotra 1.5 x 1.5 cm; distal abd aorta 70-99%,proximal common iliac arteries -very stenotic with increased velocities>50%,may be falsely elevated as a result of residual plaque from the distal aorta stenosis   lower extremity doppler  June 18 ,2013   ABI'S ABNORMAL, RABI was 0.88 and LABI 0.75 ,with 3-vessel  run off   NM MYOVIEW LTD  MAY 23,2011   showed no significant ischemia;   NM MYOVIEW LTD  04/22/2008   ef 77%,exercise capcity 6 METS ,exaggerated blood pressure response to exercise   PERIPHERAL VASCULAR CATHETERIZATION N/A 04/24/2016   Procedure: Lower Extremity Angiography;  Surgeon: Runell Gess, MD;  Location: [facility] INVASIVE CV [facility];  Service: Cardiovascular;  Laterality: N/A;   PERIPHERAL VASCULAR CATHETERIZATION  04/24/2016   Procedure: Peripheral Vascular Intervention;  Surgeon: Runell Gess, MD;  Location: [facility] INVASIVE CV LAB;  Service: Cardiovascular;;  Aorta   retrograde central aortic  catheterization  05/19/2005   cutting balloon atherectomy, c-circ stenosis with DES STENTING CYPHER   TONSILLECTOMY     TOTAL HIP ARTHROPLASTY Right 07/11/2022   Procedure: TOTAL HIP ARTHROPLASTY ANTERIOR APPROACH;  Surgeon: Durene Romans, MD;  Location: [facility];  Service: Orthopedics;  Laterality: Right;   TOTAL KNEE ARTHROPLASTY Left 01/03/2019   Procedure: TOTAL KNEE ARTHROPLASTY;  Surgeon: Beverely Low, MD;  Location: [facility];  Service: Orthopedics;  Laterality: Left;  with IS block   TRANSURETHRAL RESECTION OF PROSTATE N/A 09/08/2016   Procedure: TRANSURETHRAL RESECTION OF THE PROSTATE (TURP);  Surgeon: Crist Fat, MD;  Location: [facility];  Service: Urology;  Laterality: N/A;   TRANSURETHRAL RESECTION OF PROSTATE     10-10-17  Dr. Marlou Porch   TRANSURETHRAL RESECTION OF PROSTATE N/A 10/10/2017   Procedure: TRANSURETHRAL RESECTION OF THE PROSTATE (TURP);  Surgeon: Crist Fat, MD;  Location: [facility];  Service: Urology;  Laterality: N/A;   VASECTOMY      Allergies  Allergen Reactions   Lisinopril Cough   Niaspan [Niacin Er (Antihyperlipidemic)] Rash    Outpatient Encounter Medications as of 06/04/2023  Medication Sig   acetaminophen (TYLENOL) 500 MG tablet Take 1 tablet (500 mg total) by mouth 2 (two) times daily as needed.   amLODipine (NORVASC) 10 MG tablet Take 10 mg by mouth daily.   Apoaequorin (PREVAGEN PO) Take 1 tablet by mouth daily.   atorvastatin (LIPITOR) 40 MG tablet Take 40 mg by mouth daily at 6 PM.    Biotin 10 MG TABS Take 10 mg by mouth daily in the afternoon.   cetirizine (ZYRTEC) 10 MG tablet Take 1 tablet (10 mg total) by mouth at bedtime.   cholecalciferol (VITAMIN D3) 25 MCG (1000 UT) tablet Take 1,000 Units by mouth daily.   cloNIDine (CATAPRES) 0.1 MG tablet Take 1 tablet (0.1 mg total) by mouth at bedtime.   clopidogrel (PLAVIX) 75 MG tablet Take 75 mg by mouth every evening.    finasteride (PROSCAR) 5 MG tablet Take 5 mg by mouth daily.   fluconazole  (DIFLUCAN) 100 MG tablet Take 100 mg by mouth daily.   gabapentin (NEURONTIN) 100 MG capsule Take 100 mg by mouth 3 (three) times daily.   Glucosamine-Chondroitin 750-600 MG TABS Take 1 tablet by mouth 2 (two) times daily.   hydrALAZINE (APRESOLINE) 25 MG tablet Take 25 mg by mouth every 8 (eight) hours as needed.   hydrALAZINE (APRESOLINE) 50 MG tablet Take by mouth. 75 mg AM and HS 50 mg in afternood   hydrochlorothiazide (MICROZIDE) 12.5 MG capsule Take 2 capsules (25 mg total) by mouth daily.   HYDROcodone bit-homatropine (HYCODAN) 5-1.5 MG/5ML syrup Take 5 mLs by mouth at bedtime as needed for cough.   iron polysaccharides (NIFEREX) 150 MG capsule Take 150 mg by mouth every Monday, Wednesday, and Friday.   losartan (COZAAR) 50 MG tablet Take 50 mg by  mouth 2 (two) times daily.   melatonin 5 MG TABS Take 1 tablet (5 mg total) by mouth at bedtime.   metoprolol tartrate (LOPRESSOR) 50 MG tablet Take 75 mg by mouth 2 (two) times daily.   Multiple Vitamin (MULTIVITAMIN WITH MINERALS) TABS tablet Take 1 tablet by mouth daily.   NYAMYC powder Apply 1 Application topically daily.   ondansetron (ZOFRAN) 4 MG tablet Take 4 mg by mouth every 6 (six) hours as needed for nausea or vomiting.   pantoprazole (PROTONIX) 40 MG tablet Take 40 mg by mouth 2 (two) times daily.   potassium chloride SA (K-DUR,KLOR-CON) 20 MEQ tablet Take 20 mEq by mouth every evening.    tamsulosin (FLOMAX) 0.4 MG CAPS capsule Take 1 capsule (0.4 mg total) by mouth daily after supper.   No facility-administered encounter medications on file as of 06/04/2023.    Review of Systems:  Review of Systems  Constitutional:  Negative for activity change, appetite change and unexpected weight change.  HENT: Negative.    Respiratory:  Negative for cough and shortness of breath.   Cardiovascular:  Negative for leg swelling.  Gastrointestinal:  Negative for constipation.  Genitourinary:  Negative for frequency.  Musculoskeletal:   Positive for gait problem. Negative for arthralgias and myalgias.  Skin:  Positive for rash.  Neurological:  Negative for dizziness and weakness.  Psychiatric/Behavioral:  Negative for confusion and sleep disturbance.   All other systems reviewed and are negative.   Health Maintenance  Topic Date Due   INFLUENZA VACCINE  03/01/2023   COVID-19 Vaccine (6 - 2023-24 season) 04/01/2023   Pneumonia Vaccine 42+ Years old (2 of 2 - PCV) 08/19/2023 (Originally 09/25/2019)   DTaP/Tdap/Td (2 - Tdap) 09/09/2023   Medicare Annual Wellness (AWV)  11/28/2023   Zoster Vaccines- Shingrix  Completed   HPV VACCINES  Aged Out    Physical Exam: Vitals:   06/04/23 1656  BP: (!) 178/72  Pulse: 63  Resp: 17  Temp: 97.6 F (36.4 C)  TempSrc: Temporal  SpO2: 92%  Weight: 181 lb (82.1 kg)  Height: 5\' 4"  (1.626 m)   Body mass index is 31.07 kg/m. Physical Exam Vitals reviewed.  Constitutional:      Appearance: Normal appearance.  HENT:     Head: Normocephalic.     Nose: Nose normal.     Mouth/Throat:     Mouth: Mucous membranes are moist.     Pharynx: Oropharynx is clear.  Eyes:     Pupils: Pupils are equal, round, and reactive to light.  Cardiovascular:     Rate and Rhythm: Normal rate and regular rhythm.     Pulses: Normal pulses.     Heart sounds: Murmur heard.  Pulmonary:     Effort: Pulmonary effort is normal. No respiratory distress.     Breath sounds: Normal breath sounds. No rales.  Abdominal:     General: Abdomen is flat. Bowel sounds are normal.     Palpations: Abdomen is soft.  Musculoskeletal:        General: Swelling present.     Cervical back: Neck supple.  Skin:    Comments: Scrotal rash present  Neurological:     General: No focal deficit present.     Mental Status: He is alert and oriented to person, place, and time.  Psychiatric:        Mood and Affect: Mood normal.        Thought Content: Thought content normal.     Labs reviewed: Basic  Metabolic  Panel: Recent Labs    07/11/22 0334 07/12/22 0329 07/12/22 0330 07/13/22 0335 07/13/22 0335 07/20/22 0000 07/27/22 0000 10/17/22 0000 03/01/23 1450  NA 142 143 143 145   < > 141 140 142 142  K 3.8 4.6 4.6 4.0  --  4.2 3.9 4.0 4.2  CL 108 110 110 115*  --  107 103 105 107  CO2 26 25 24 25   --  23* 23* 26*  --   GLUCOSE 129* 179* 181* 186*  --   --   --   --  109*  BUN 16 26* 24* 33*   < > 33* 30* 21 20  CREATININE 0.84 1.32* 1.34* 1.04   < > 1.0 1.5* 1.0 1.10  CALCIUM 8.7* 8.7* 8.7* 8.8*  --  9.0 9.4 9.6  --   MG 2.1 2.4  --  2.2  --   --   --   --   --   PHOS 3.3  --  3.4 2.8  --   --   --   --   --    < > = values in this interval not displayed.   Liver Function Tests: Recent Labs    07/12/22 0330 07/13/22 0335 10/17/22 0000  AST  --   --  16  ALT  --   --  15  ALKPHOS  --   --  111  ALBUMIN 2.7* 2.9* 4.1   No results for input(s): "LIPASE", "AMYLASE" in the last 8760 hours. No results for input(s): "AMMONIA" in the last 8760 hours. CBC: Recent Labs    07/09/22 1450 07/11/22 0334 07/12/22 0329 07/13/22 0335 07/20/22 0000 07/27/22 0000 10/17/22 0000 03/01/23 1450  WBC 9.0 10.2 14.4* 16.0* 11.0 15.5 7.6  --   NEUTROABS 7.3  --   --   --   --   --   --   --   HGB 12.6* 12.2* 10.5* 9.6* 10.4* 10.5* 12.6* 12.6*  HCT 38.5* 37.6* 32.6* 30.0* 32* 33* 36* 37.0*  MCV 87.1 88.3 89.6 89.3  --   --   --   --   PLT 257 202 201 205 258 296 217  --    Lipid Panel: Recent Labs    10/17/22 0000  CHOL 133  HDL 49  LDLCALC 65  TRIG 122   Lab Results  Component Value Date   HGBA1C 5.5 11/23/2016    Procedures since last visit: No results found.  Assessment/Plan 1. Scrotal rash Diflucan 100 mg For 5 days Continue Nystatin  Add Hydrocortisone 2.5 mg with Nystatin till Rash resolved    Labs/tests ordered:  * No order type specified * Next appt:  Visit date not found

## 2023-06-06 DIAGNOSIS — M6281 Muscle weakness (generalized): Secondary | ICD-10-CM | POA: Diagnosis not present

## 2023-06-06 DIAGNOSIS — M6389 Disorders of muscle in diseases classified elsewhere, multiple sites: Secondary | ICD-10-CM | POA: Diagnosis not present

## 2023-06-06 DIAGNOSIS — R278 Other lack of coordination: Secondary | ICD-10-CM | POA: Diagnosis not present

## 2023-06-07 ENCOUNTER — Other Ambulatory Visit: Payer: Self-pay | Admitting: Adult Health

## 2023-06-07 DIAGNOSIS — R053 Chronic cough: Secondary | ICD-10-CM

## 2023-06-07 MED ORDER — HYDROCODONE BIT-HOMATROP MBR 5-1.5 MG/5ML PO SOLN
5.0000 mL | Freq: Every evening | ORAL | 0 refills | Status: DC | PRN
Start: 2023-06-07 — End: 2023-08-23

## 2023-06-08 ENCOUNTER — Non-Acute Institutional Stay (SKILLED_NURSING_FACILITY): Payer: Medicare Other | Admitting: Adult Health

## 2023-06-08 ENCOUNTER — Encounter: Payer: Self-pay | Admitting: Adult Health

## 2023-06-08 DIAGNOSIS — I739 Peripheral vascular disease, unspecified: Secondary | ICD-10-CM | POA: Diagnosis not present

## 2023-06-08 DIAGNOSIS — K219 Gastro-esophageal reflux disease without esophagitis: Secondary | ICD-10-CM

## 2023-06-08 DIAGNOSIS — G629 Polyneuropathy, unspecified: Secondary | ICD-10-CM

## 2023-06-08 DIAGNOSIS — I10 Essential (primary) hypertension: Secondary | ICD-10-CM

## 2023-06-08 DIAGNOSIS — M6389 Disorders of muscle in diseases classified elsewhere, multiple sites: Secondary | ICD-10-CM | POA: Diagnosis not present

## 2023-06-08 DIAGNOSIS — Z8679 Personal history of other diseases of the circulatory system: Secondary | ICD-10-CM | POA: Diagnosis not present

## 2023-06-08 DIAGNOSIS — R278 Other lack of coordination: Secondary | ICD-10-CM | POA: Diagnosis not present

## 2023-06-08 DIAGNOSIS — D509 Iron deficiency anemia, unspecified: Secondary | ICD-10-CM | POA: Diagnosis not present

## 2023-06-08 DIAGNOSIS — M6281 Muscle weakness (generalized): Secondary | ICD-10-CM | POA: Diagnosis not present

## 2023-06-08 MED ORDER — CLONIDINE HCL 0.1 MG PO TABS
0.1000 mg | ORAL_TABLET | Freq: Two times a day (BID) | ORAL | Status: DC
Start: 2023-06-08 — End: 2023-07-16

## 2023-06-08 NOTE — Progress Notes (Unsigned)
Location:  [facility] Room Number: 149A Place of Service:  SNF [phone]) Provider:  Tamsen Roers, MD  Patient Care Team: Mahlon Gammon, MD as PCP - General (Internal Medicine) Runell Gess, MD as PCP - Cardiology (Cardiology)  Extended Emergency Contact Information Primary Emergency Contact: [name] Address: [address]          [place], [place] [address] [place] of [place] Home Phone: [phone] Mobile Phone: [phone] Relation: Spouse  Code Status: DNR Goals of care: Advanced Directive information    06/08/2023   10:48 AM  Advanced Directives  Does Patient Have a Medical Advance Directive? Yes  Type of Estate agent of [place];Living will;Out of facility DNR (pink MOST or yellow form)  Does patient want to make changes to medical advance directive? No - Patient declined  Copy of Healthcare Power of Attorney in Chart? Yes - validated most recent copy scanned in chart (See row information)     Chief Complaint  Patient presents with  . Medical Management of Chronic Issues    Patient is being seen for routine visit     HPI:  Pt is a 89 y.o. male seen today for medical management of chronic diseases.    PMH of CAD, PAD followed with Dr Allyson Sabal IDA, GERD, HTN, PVD and HLD He had a mechanical fall and subsequent severe right hip pain and inability to bear weight, and impacted subcapital femoral neck fracture. He underwent right THA by Dr. Charlann Boxer on 07/11/2022.  BP still running in the 150-170 range systolic. Has some edema in his legs. No sob. No dizziness.   Also reports tingling in his heels which is not new. It occurs mostly at night. He is on neurontin to help.  Has lumbar stenosis and hx of leg weakness with peripheral neuropathy Hx of AAA ICA stenosis and PAD, followed by Dr Allyson Sabal. Last carotid US showing bilateral ICA stenosis with progression. Repeat ordered 12  months.  Renal artery stenosis left 90%. Also has femoral artery stenosis.  Currently denies claudication but has limited ambulation and poor memory  ABI 09/29/21 Right: Resting right ankle-brachial index indicates mild right lower  extremity arterial disease. The right toe-brachial index is abnormal.   Left: Resting left ankle-brachial index indicates moderate left lower  extremity arterial disease. The left toe-brachial index is abnormal.   He walks with a walker short distances and uses a scooter for long distances. Needs assistance with bathing, dressing. Able to feed himself. Very pleasant. Incontinent intermittently.   Seen in the ED 03/01/23 after a mechanical fall in his wife's apt. He struck his head on plavix. CT of the neck showed a C1 fracture of the left lamina. He is following up with neurosurgery. Denies any pain or numbness or tingling.  He was in a [place] collar. He saw neurosurgery 03/21/23, they recommended changing to a soft cervical collar for comfort. Fx appeared stable. He is following up in October with repeat xrays. He is not having any pain.   He has a tremor with intention, seems slightly worse over time. He denies that it is affecting his QOL or ADls.   He has a hx of urinary retention but reports being able to urinate ok for now. Hx of hydronephrosis and stent.    MMSE 26/30 11/2022  Chronic cough on hycodan, hx of cough due to gerd. No lung disease noted per note by pulmonary 2019. ON PPI.  Past Medical History:  Diagnosis Date  . AAA (abdominal aortic aneurysm) (HCC)    asymptomatic  . AAA (abdominal aortic aneurysm) (HCC) 2010   peripheral  angiogram-- bilateral SFA DISEASE  and 60 to 70% infrarenaal abd. aortic stenosis with 15 -mm gradient  . Adenomatous colon polyp 11/2003  . CAD (coronary artery disease)   . Gait abnormality 02/13/2017  . Gastroparesis    pt denies  . GERD (gastroesophageal reflux disease)    w/ LPR  . History of kidney stones     x2  . Hyperlipidemia   . Hypertension   . IDA (iron deficiency anemia)   . Peripheral arterial disease (HCC)    RCEA  by Dr [name]  . PUD (peptic ulcer disease)    pt unaware  . Vertigo    Past Surgical History:  Procedure Laterality Date  . ABDOMINAL AORTOGRAM W/LOWER EXTREMITY Bilateral 08/05/2018   Procedure: ABDOMINAL AORTOGRAM W/LOWER EXTREMITY;  Surgeon: Runell Gess, MD;  Location: [facility] INVASIVE CV LAB;  Service: Cardiovascular;  Laterality: Bilateral;  . ABDOMINAL AORTOGRAM W/LOWER EXTREMITY N/A 09/26/2021   Procedure: ABDOMINAL AORTOGRAM W/LOWER EXTREMITY;  Surgeon: Runell Gess, MD;  Location: [facility] INVASIVE CV LAB;  Service: Cardiovascular;  Laterality: N/A;  . AORTOGRAM  04/24/2016    Abdominal aortogram, bilateral iliac angiogram, bifemoral runoff  . CARDIAC CATHETERIZATION  05/12/2005   RCA  . carotid doppler  01/24/2013   RICA endarterectomy,left CCA 0-49%; left bulb and prox ICA 50-69%; bilaateral subclavian < 50%  . CAROTID ENDARTERECTOMY  11/01/2010  . CATARACT EXTRACTION    . COLONOSCOPY    . CORONARY ANGIOPLASTY  05/18/2005   2 STENTS distal RCA AND PROXIMAL-MID RCA   5 total stents per pt.  . CYSTOSCOPY/URETEROSCOPY/HOLMIUM LASER/STENT PLACEMENT Left 01/11/2018   Procedure: LEFT URETEROSCOPY/HOLMIUM LASER/STENT PLACEMENT;  Surgeon: Crist Fat, MD;  Location: [facility] ORS;  Service: Urology;  Laterality: Left;  . DOPPLER ECHOCARDIOGRAPHY  02/07/2012   EF 55%,SHOWED NO ISCHEMIA   . HERNIA REPAIR     umbilical  . lower arterial  doppler  02/04/2013   aotra 1.5 x 1.5 cm; distal abd aorta 70-99%,proximal common iliac arteries -very stenotic with increased velocities>50%,may be falsely elevated as a result of residual plaque from the distal aorta stenosis  . lower extremity doppler  June 18 ,2013   ABI'S ABNORMAL, RABI was 0.88 and LABI 0.75 ,with 3-vessel  run off  . NM MYOVIEW LTD  MAY [id number]   showed no significant ischemia;  . NM MYOVIEW LTD   04/22/2008   ef 77%,exercise capcity 6 METS ,exaggerated blood pressure response to exercise  . PERIPHERAL VASCULAR CATHETERIZATION N/A 04/24/2016   Procedure: Lower Extremity Angiography;  Surgeon: Runell Gess, MD;  Location: [facility] INVASIVE CV LAB;  Service: Cardiovascular;  Laterality: N/A;  . PERIPHERAL VASCULAR CATHETERIZATION  04/24/2016   Procedure: Peripheral Vascular Intervention;  Surgeon: Runell Gess, MD;  Location: [facility] INVASIVE CV LAB;  Service: Cardiovascular;;  Aorta  . retrograde central aortic catheterization  05/19/2005   cutting balloon atherectomy, c-circ stenosis with DES STENTING CYPHER  . TONSILLECTOMY    . TOTAL HIP ARTHROPLASTY Right 07/11/2022   Procedure: TOTAL HIP ARTHROPLASTY ANTERIOR APPROACH;  Surgeon: Durene Romans, MD;  Location: [facility];  Service: Orthopedics;  Laterality: Right;  . TOTAL KNEE ARTHROPLASTY Left 01/03/2019   Procedure: TOTAL KNEE ARTHROPLASTY;  Surgeon: Beverely Low, MD;  Location: [facility];  Service: Orthopedics;  Laterality: Left;  with IS block  .  TRANSURETHRAL RESECTION OF PROSTATE N/A 09/08/2016   Procedure: TRANSURETHRAL RESECTION OF THE PROSTATE (TURP);  Surgeon: Crist Fat, MD;  Location: [facility];  Service: Urology;  Laterality: N/A;  . TRANSURETHRAL RESECTION OF PROSTATE     10-10-17  Dr. Marlou Porch  . TRANSURETHRAL RESECTION OF PROSTATE N/A 10/10/2017   Procedure: TRANSURETHRAL RESECTION OF THE PROSTATE (TURP);  Surgeon: Crist Fat, MD;  Location: [facility];  Service: Urology;  Laterality: N/A;  . VASECTOMY      Allergies  Allergen Reactions  . Lisinopril Cough  . Niaspan [Niacin Er (Antihyperlipidemic)] Rash    Outpatient Encounter Medications as of 06/08/2023  Medication Sig  . acetaminophen (TYLENOL) 500 MG tablet Take 1 tablet (500 mg total) by mouth 2 (two) times daily as needed.  [name] amLODipine (NORVASC) 10 MG tablet Take 10 mg by mouth daily.  [name] Apoaequorin (PREVAGEN PO) Take 1 tablet by mouth daily.  [name] atorvastatin  (LIPITOR) 40 MG tablet Take 40 mg by mouth daily at 6 PM.   . Biotin 10 MG TABS Take 10 mg by mouth daily in the afternoon.  . cetirizine (ZYRTEC) 10 MG tablet Take 1 tablet (10 mg total) by mouth at bedtime.  . cholecalciferol (VITAMIN D3) 25 MCG (1000 UT) tablet Take 1,000 Units by mouth daily.  . clopidogrel (PLAVIX) 75 MG tablet Take 75 mg by mouth every evening.   . finasteride (PROSCAR) 5 MG tablet Take 5 mg by mouth daily.  [name] gabapentin (NEURONTIN) 100 MG capsule Take 100 mg by mouth 3 (three) times daily.  . Glucosamine-Chondroitin 750-600 MG TABS Take 1 tablet by mouth 2 (two) times daily.  . hydrALAZINE (APRESOLINE) 25 MG tablet Take 25 mg by mouth every 8 (eight) hours as needed.  . hydrALAZINE (APRESOLINE) 50 MG tablet Take by mouth. 75 mg AM and HS 50 mg in afternood  . hydrochlorothiazide (MICROZIDE) 12.5 MG capsule Take 2 capsules (25 mg total) by mouth daily.  [name] HYDROcodone bit-homatropine (HYCODAN) 5-1.5 MG/5ML syrup Take 5 mLs by mouth at bedtime as needed for cough.  . losartan (COZAAR) 50 MG tablet Take 50 mg by mouth 2 (two) times daily.  . melatonin 5 MG TABS Take 1 tablet (5 mg total) by mouth at bedtime.  . metoprolol tartrate (LOPRESSOR) 50 MG tablet Take 75 mg by mouth 2 (two) times daily.  . Multiple Vitamin (MULTIVITAMIN WITH MINERALS) TABS tablet Take 1 tablet by mouth daily.  [name] NYAMYC powder Apply 1 Application topically daily.  . ondansetron (ZOFRAN) 4 MG tablet Take 4 mg by mouth every 6 (six) hours as needed for nausea or vomiting.  . pantoprazole (PROTONIX) 40 MG tablet Take 40 mg by mouth 2 (two) times daily.  . potassium chloride SA (K-DUR,KLOR-CON) 20 MEQ tablet Take 20 mEq by mouth every evening.   . tamsulosin (FLOMAX) 0.4 MG CAPS capsule Take 1 capsule (0.4 mg total) by mouth daily after supper.  . [DISCONTINUED] cloNIDine (CATAPRES) 0.1 MG tablet Take 1 tablet (0.1 mg total) by mouth at bedtime.  . cloNIDine (CATAPRES) 0.1 MG tablet Take 1 tablet (0.1  mg total) by mouth 2 (two) times daily.  . fluconazole (DIFLUCAN) 100 MG tablet Take 100 mg by mouth daily. (Patient not taking: Reported on 06/08/2023)  . iron polysaccharides (NIFEREX) 150 MG capsule Take 150 mg by mouth every Monday, Wednesday, and Friday.   No facility-administered encounter medications on file as of 06/08/2023.    Review of Systems  Constitutional:  Negative for activity  change, appetite change, chills, diaphoresis, fatigue, fever and unexpected weight change.  HENT:  Negative for congestion.   Eyes:  Negative for visual disturbance.  Respiratory:  Positive for cough. Negative for shortness of breath, wheezing and stridor.   Cardiovascular:  Positive for leg swelling. Negative for chest pain and palpitations.  Gastrointestinal:  Negative for abdominal distention, abdominal pain, constipation and diarrhea.  Genitourinary:  Negative for difficulty urinating and dysuria.  Musculoskeletal:  Positive for arthralgias and gait problem. Negative for back pain, joint swelling and myalgias.  Neurological:  Positive for numbness. Negative for dizziness, seizures, syncope, facial asymmetry, speech difficulty, weakness and headaches.  Hematological:  Negative for adenopathy. Does not bruise/bleed easily.  Psychiatric/Behavioral:  Negative for agitation, behavioral problems and confusion.     Immunization History  Administered Date(s) Administered  . Fluad Quad(high Dose 65+) 05/02/2022, 05/22/2023  . Influenza, High Dose Seasonal PF 03/31/2017, 03/29/2018  . Influenza,inj,Quad PF,6+ Mos 04/25/2016, 05/06/2018  . Influenza,inj,quad, With Preservative 05/26/2019  . Influenza-Unspecified 05/14/2001, 05/31/2001, 05/14/2002, 05/15/2002, 05/18/2003, 05/25/2004, 04/30/2005, 05/29/2006, 05/01/2007, 05/11/2008, 05/31/2009, 05/31/2010, 04/30/2013, 05/31/2014, 03/31/2021  . Moderna Covid-19 Vaccine Bivalent Booster 4yrs & up 08/31/2021, 05/22/2023  . Moderna SARS-COV2 Booster Vaccination  06/15/2020, 11/19/2020, 01/02/2022  . Moderna Sars-Covid-2 Vaccination 08/12/2019, 09/10/2019, 05/31/2022  . [employer], [employer] 84yrs & up 11/23/2022  . Pneumococcal Polysaccharide-23 05/02/2000, 09/24/2018  . Rsv, Bivalent, Protein Subunit Rsvpref,pf Verdis Frederickson) 07/19/2022  . Td 09/08/2013  . Zoster Recombinant(Shingrix) 10/04/2017, 01/23/2018   Pertinent  Health Maintenance Due  Topic Date Due  . INFLUENZA VACCINE  Completed      07/13/2022    7:10 AM 07/28/2022    9:00 AM 10/14/2022    7:21 AM 11/28/2022    2:50 PM 06/04/2023    4:59 PM  Fall Risk  Falls in the past year?   1 1 0  Was there an injury with Fall?   1 0 0  Fall Risk Category Calculator   3 1 0  (RETIRED) Patient Fall Risk Level High fall risk High fall risk     Patient at Risk for Falls Due to    History of fall(s);Impaired balance/gait;Impaired mobility No Fall Risks  Fall risk Follow up   Falls evaluation completed Falls evaluation completed;Education provided;Falls prevention discussed Falls evaluation completed   Functional Status Survey:    Vitals:   06/08/23 1041  BP: (!) 149/62  Pulse: 63  Resp: 17  Temp: 97.6 F (36.4 C)  TempSrc: Temporal  SpO2: 92%  Weight: 180 lb 14.4 oz (82.1 kg)  Height: 5\' 4"  (1.626 m)   Body mass index is 31.05 kg/m. Physical Exam Vitals and nursing note reviewed.  Constitutional:      General: He is not in acute distress.    Appearance: He is not diaphoretic.  HENT:     Head: Normocephalic and atraumatic.  Neck:     Thyroid: No thyromegaly.     Vascular: No JVD.     Trachea: No tracheal deviation.  Cardiovascular:     Rate and Rhythm: Normal rate and regular rhythm.     Heart sounds: No murmur heard. Pulmonary:     Effort: Pulmonary effort is normal. No respiratory distress.     Breath sounds: Normal breath sounds. No wheezing.  Abdominal:     General: Bowel sounds are normal. There is no distension.     Palpations: Abdomen is soft.      Tenderness: There is no abdominal tenderness.  Musculoskeletal:  Comments: BLE edema +1  Lymphadenopathy:     Cervical: No cervical adenopathy.  Skin:    General: Skin is warm and dry.  Neurological:     General: No focal deficit present.     Mental Status: He is alert. Mental status is at baseline.    Labs reviewed: Recent Labs    07/11/22 0334 07/12/22 0329 07/12/22 0330 07/13/22 0335 07/13/22 0335 07/20/22 0000 07/27/22 0000 10/17/22 0000 03/01/23 1450  NA 142 143 143 145   < > 141 140 142 142  K 3.8 4.6 4.6 4.0  --  4.2 3.9 4.0 4.2  CL 108 110 110 115*  --  107 103 105 107  CO2 26 25 24 25   --  23* 23* 26*  --   GLUCOSE 129* 179* 181* 186*  --   --   --   --  109*  BUN 16 26* 24* 33*   < > 33* 30* 21 20  CREATININE 0.84 1.32* 1.34* 1.04   < > 1.0 1.5* 1.0 1.10  CALCIUM 8.7* 8.7* 8.7* 8.8*  --  9.0 9.4 9.6  --   MG 2.1 2.4  --  2.2  --   --   --   --   --   PHOS 3.3  --  3.4 2.8  --   --   --   --   --    < > = values in this interval not displayed.   Recent Labs    07/12/22 0330 07/13/22 0335 10/17/22 0000  AST  --   --  16  ALT  --   --  15  ALKPHOS  --   --  111  ALBUMIN 2.7* 2.9* 4.1   Recent Labs    07/09/22 1450 07/11/22 0334 07/12/22 0329 07/13/22 0335 07/20/22 0000 07/27/22 0000 10/17/22 0000 03/01/23 1450  WBC 9.0 10.2 14.4* 16.0* 11.0 15.5 7.6  --   NEUTROABS 7.3  --   --   --   --   --   --   --   HGB 12.6* 12.2* 10.5* 9.6* 10.4* 10.5* 12.6* 12.6*  HCT 38.5* 37.6* 32.6* 30.0* 32* 33* 36* 37.0*  MCV 87.1 88.3 89.6 89.3  --   --   --   --   PLT 257 202 201 205 258 296 217  --    Lab Results  Component Value Date   TSH 1.104 05/13/2022   Lab Results  Component Value Date   HGBA1C 5.5 11/23/2016   Lab Results  Component Value Date   CHOL 133 10/17/2022   HDL 49 10/17/2022   LDLCALC 65 10/17/2022   TRIG 122 10/17/2022   CHOLHDL 2.4 11/23/2016    Significant Diagnostic Results in last 30 days:  No results  found.  Assessment/Plan There are no diagnoses linked to this encounter.   Family/ staff Communication: resident   Labs/tests ordered:  NA

## 2023-06-09 ENCOUNTER — Encounter: Payer: Self-pay | Admitting: Adult Health

## 2023-06-12 DIAGNOSIS — M6281 Muscle weakness (generalized): Secondary | ICD-10-CM | POA: Diagnosis not present

## 2023-06-12 DIAGNOSIS — R278 Other lack of coordination: Secondary | ICD-10-CM | POA: Diagnosis not present

## 2023-06-12 DIAGNOSIS — M6389 Disorders of muscle in diseases classified elsewhere, multiple sites: Secondary | ICD-10-CM | POA: Diagnosis not present

## 2023-06-13 DIAGNOSIS — M6389 Disorders of muscle in diseases classified elsewhere, multiple sites: Secondary | ICD-10-CM | POA: Diagnosis not present

## 2023-06-13 DIAGNOSIS — R278 Other lack of coordination: Secondary | ICD-10-CM | POA: Diagnosis not present

## 2023-06-13 DIAGNOSIS — M6281 Muscle weakness (generalized): Secondary | ICD-10-CM | POA: Diagnosis not present

## 2023-06-15 DIAGNOSIS — M6281 Muscle weakness (generalized): Secondary | ICD-10-CM | POA: Diagnosis not present

## 2023-06-15 DIAGNOSIS — R278 Other lack of coordination: Secondary | ICD-10-CM | POA: Diagnosis not present

## 2023-06-15 DIAGNOSIS — M6389 Disorders of muscle in diseases classified elsewhere, multiple sites: Secondary | ICD-10-CM | POA: Diagnosis not present

## 2023-06-18 DIAGNOSIS — I10 Essential (primary) hypertension: Secondary | ICD-10-CM | POA: Diagnosis not present

## 2023-06-18 DIAGNOSIS — R278 Other lack of coordination: Secondary | ICD-10-CM | POA: Diagnosis not present

## 2023-06-18 DIAGNOSIS — D509 Iron deficiency anemia, unspecified: Secondary | ICD-10-CM | POA: Diagnosis not present

## 2023-06-18 DIAGNOSIS — M6389 Disorders of muscle in diseases classified elsewhere, multiple sites: Secondary | ICD-10-CM | POA: Diagnosis not present

## 2023-06-18 DIAGNOSIS — M6281 Muscle weakness (generalized): Secondary | ICD-10-CM | POA: Diagnosis not present

## 2023-06-18 LAB — COMPREHENSIVE METABOLIC PANEL
Albumin: 3.9 (ref 3.5–5.0)
Calcium: 9.4 (ref 8.7–10.7)
Globulin: 2.9
eGFR: 53

## 2023-06-18 LAB — BASIC METABOLIC PANEL
BUN: 29 — AB (ref 4–21)
CO2: 23 — AB (ref 13–22)
Chloride: 110 — AB (ref 99–108)
Creatinine: 1.3 (ref 0.6–1.3)
Glucose: 111
Potassium: 4.3 meq/L (ref 3.5–5.1)
Sodium: 144 (ref 137–147)

## 2023-06-18 LAB — HEPATIC FUNCTION PANEL
ALT: 15 U/L (ref 10–40)
AST: 16 (ref 14–40)
Alkaline Phosphatase: 91 (ref 25–125)
Bilirubin, Total: 0.3

## 2023-06-18 LAB — CBC AND DIFFERENTIAL
HCT: 34 — AB (ref 41–53)
Hemoglobin: 11.6 — AB (ref 13.5–17.5)
Platelets: 188 10*3/uL (ref 150–400)
WBC: 6.8

## 2023-06-18 LAB — CBC: RBC: 3.78 — AB (ref 3.87–5.11)

## 2023-06-18 LAB — TSH: TSH: 6.02 — AB (ref 0.41–5.90)

## 2023-06-20 DIAGNOSIS — M6281 Muscle weakness (generalized): Secondary | ICD-10-CM | POA: Diagnosis not present

## 2023-06-20 DIAGNOSIS — R278 Other lack of coordination: Secondary | ICD-10-CM | POA: Diagnosis not present

## 2023-06-20 DIAGNOSIS — M6389 Disorders of muscle in diseases classified elsewhere, multiple sites: Secondary | ICD-10-CM | POA: Diagnosis not present

## 2023-06-22 DIAGNOSIS — M6389 Disorders of muscle in diseases classified elsewhere, multiple sites: Secondary | ICD-10-CM | POA: Diagnosis not present

## 2023-06-22 DIAGNOSIS — M6281 Muscle weakness (generalized): Secondary | ICD-10-CM | POA: Diagnosis not present

## 2023-06-22 DIAGNOSIS — R278 Other lack of coordination: Secondary | ICD-10-CM | POA: Diagnosis not present

## 2023-06-25 DIAGNOSIS — R278 Other lack of coordination: Secondary | ICD-10-CM | POA: Diagnosis not present

## 2023-06-25 DIAGNOSIS — M6281 Muscle weakness (generalized): Secondary | ICD-10-CM | POA: Diagnosis not present

## 2023-06-25 DIAGNOSIS — M6389 Disorders of muscle in diseases classified elsewhere, multiple sites: Secondary | ICD-10-CM | POA: Diagnosis not present

## 2023-06-27 DIAGNOSIS — R278 Other lack of coordination: Secondary | ICD-10-CM | POA: Diagnosis not present

## 2023-06-27 DIAGNOSIS — M6389 Disorders of muscle in diseases classified elsewhere, multiple sites: Secondary | ICD-10-CM | POA: Diagnosis not present

## 2023-06-27 DIAGNOSIS — M6281 Muscle weakness (generalized): Secondary | ICD-10-CM | POA: Diagnosis not present

## 2023-06-29 DIAGNOSIS — M6389 Disorders of muscle in diseases classified elsewhere, multiple sites: Secondary | ICD-10-CM | POA: Diagnosis not present

## 2023-06-29 DIAGNOSIS — R278 Other lack of coordination: Secondary | ICD-10-CM | POA: Diagnosis not present

## 2023-06-29 DIAGNOSIS — M6281 Muscle weakness (generalized): Secondary | ICD-10-CM | POA: Diagnosis not present

## 2023-07-02 ENCOUNTER — Telehealth: Payer: Self-pay | Admitting: Orthopedic Surgery

## 2023-07-02 NOTE — Telephone Encounter (Signed)
Patient's wife notified and expressed understanding.

## 2023-07-02 NOTE — Telephone Encounter (Signed)
Please fax copy of my last note from 10/29 to SNF (Think he's a [facility]). I just did an addendum today.   Also, please call his wife Nelva Bush and let her know that his neck xrays look good. He does not need to wear cervical collar and can follow up with me prn. Please let me know if she has any questions.

## 2023-07-03 ENCOUNTER — Non-Acute Institutional Stay (SKILLED_NURSING_FACILITY): Payer: Medicare Other | Admitting: Orthopedic Surgery

## 2023-07-03 ENCOUNTER — Encounter: Payer: Self-pay | Admitting: Orthopedic Surgery

## 2023-07-03 DIAGNOSIS — R600 Localized edema: Secondary | ICD-10-CM | POA: Diagnosis not present

## 2023-07-03 DIAGNOSIS — I10 Essential (primary) hypertension: Secondary | ICD-10-CM | POA: Diagnosis not present

## 2023-07-03 DIAGNOSIS — R278 Other lack of coordination: Secondary | ICD-10-CM | POA: Diagnosis not present

## 2023-07-03 DIAGNOSIS — M6281 Muscle weakness (generalized): Secondary | ICD-10-CM | POA: Diagnosis not present

## 2023-07-03 NOTE — Progress Notes (Signed)
Location:  [facility] Room Number: 149-A Place of Service:  SNF [phone]) Provider:  Andrey Mccaskill Roland Earl, MD  Patient Care Team: Mahlon Gammon, MD as PCP - General (Internal Medicine) Runell Gess, MD as PCP - Cardiology (Cardiology)  Extended Emergency Contact Information Primary Emergency Contact: [name] Address: [address]          [place], [place] [address] [place] of [place] Home Phone: [phone] Mobile Phone: [phone] Relation: Spouse  Code Status:  DNR Goals of care: Advanced Directive information    06/08/2023   10:48 AM  Advanced Directives  Does Patient Have a Medical Advance Directive? Yes  Type of Estate agent of [place];Living will;Out of facility DNR (pink MOST or yellow form)  Does patient want to make changes to medical advance directive? No - Patient declined  Copy of Healthcare Power of Attorney in Chart? Yes - validated most recent copy scanned in chart (See row information)     Chief Complaint  Patient presents with   Acute Visit    Edema    HPI:  Pt is a 89 y.o. male seen today for acute visit due to lower leg edema.   He currently resides on the skilled nursing unit at [facility]. PMH: CAD, PAD, HTN, HLD, GERD, fall with right hip fracture s/p RATH 06/2022, and unstable gait.   Nursing reports increased lower leg edema x 1 day. Right 1+ pitting and left 2+ pitting. No h/o CHF. He ambulates with PWC. He is on multiple antihypertensives including full dose amlodipine. He denies chest pain, shortness of breath or dizziness. He was noted to have edema last month per chart review. Treatment options discussed, he is refusing to try compression stockings.   11/08 he was started on clonidine 0.1 mg BID due to uncontrolled blood pressures. SBP > 170's last 5 days. See trends below. He remains on amlodipine, hydralazine, losartan and metoprolol. Denies chest  pain, blurred vision, headaches or pain.   Recent blood pressures:  12/03- 178/67  11/29- 172/72, 173/67  11/28- 193/62, 178/62, 192/99    Past Medical History:  Diagnosis Date   AAA (abdominal aortic aneurysm) (HCC)    asymptomatic   AAA (abdominal aortic aneurysm) (HCC) 2010   peripheral  angiogram-- bilateral SFA DISEASE  and 60 to 70% infrarenaal abd. aortic stenosis with 15 -mm gradient   Adenomatous colon polyp 11/2003   CAD (coronary artery disease)    Gait abnormality 02/13/2017   Gastroparesis    pt denies   GERD (gastroesophageal reflux disease)    w/ LPR   History of kidney stones    x2   Hyperlipidemia    Hypertension    IDA (iron deficiency anemia)    Peripheral arterial disease (HCC)    RCEA  by Dr [name]   PUD (peptic ulcer disease)    pt unaware   Vertigo    Past Surgical History:  Procedure Laterality Date   ABDOMINAL AORTOGRAM W/LOWER EXTREMITY Bilateral 08/05/2018   Procedure: ABDOMINAL AORTOGRAM W/LOWER EXTREMITY;  Surgeon: Runell Gess, MD;  Location: [facility] INVASIVE CV LAB;  Service: Cardiovascular;  Laterality: Bilateral;   ABDOMINAL AORTOGRAM W/LOWER EXTREMITY N/A 09/26/2021   Procedure: ABDOMINAL AORTOGRAM W/LOWER EXTREMITY;  Surgeon: Runell Gess, MD;  Location: [facility] INVAS[facility] CV [facility];  Service: Cardiovascular;  Laterality: N/A;   AORTOGRAM  04/24/2016    Abdominal aortogram, bilateral iliac angiogram, bifemoral runoff   CARDIAC CATHETERIZATION  05/12/2005  RCA   carotid doppler  01/24/2013   RICA endarterectomy,left CCA 0-49%; left bulb and prox ICA 50-69%; bilaateral subclavian < 50%   CAROTID ENDARTERECTOMY  11/01/2010   CATARACT EXTRACTION     COLONOSCOPY     CORONARY ANGIOPLASTY  05/18/2005   2 STENTS distal RCA AND PROXIMAL-MID RCA   5 total stents per pt.   CYSTOSCOPY/URETEROSCOPY/HOLMIUM LASER/STENT PLACEMENT Left 01/11/2018   Procedure: LEFT URETEROSCOPY/HOLMIUM LASER/STENT PLACEMENT;  Surgeon: Crist Fat, MD;   Location: [facility];  Service: Urology;  Laterality: Left;   DOPPLER ECHOCARDIOGRAPHY  02/07/2012   EF 55%,SHOWED NO ISCHEMIA    HERNIA REPAIR     umbilical   lower arterial  doppler  02/04/2013   aotra 1.5 x 1.5 cm; distal abd aorta 70-99%,proximal common iliac arteries -very stenotic with increased velocities>50%,may be falsely elevated as a result of residual plaque from the distal aorta stenosis   lower extremity doppler  June 18 ,2013   ABI'S ABNORMAL, RABI was 0.88 and LABI 0.75 ,with 3-vessel  run off   NM MYOVIEW LTD  MAY 23,2011   showed no significant ischemia;   NM MYOVIEW LTD  04/22/2008   ef 77%,exercise capcity 6 METS ,exaggerated blood pressure response to exercise   PERIPHERAL VASCULAR CATHETERIZATION N/A 04/24/2016   Procedure: Lower Extremity Angiography;  Surgeon: Runell Gess, MD;  Location: [facility] INVASIVE CV LAB;  Service: Cardiovascular;  Laterality: N/A;   PERIPHERAL VASCULAR CATHETERIZATION  04/24/2016   Procedure: Peripheral Vascular Intervention;  Surgeon: Runell Gess, MD;  Location: [facility] INVASIVE CV LAB;  Service: Cardiovascular;;  Aorta   retrograde central aortic catheterization  05/19/2005   cutting balloon atherectomy, c-circ stenosis with DES STENTING CYPHER   TONSILLECTOMY     TOTAL HIP ARTHROPLASTY Right 07/11/2022   Procedure: TOTAL HIP ARTHROPLASTY ANTERIOR APPROACH;  Surgeon: Durene Romans, MD;  Location: [facility];  Service: Orthopedics;  Laterality: Right;   TOTAL KNEE ARTHROPLASTY Left 01/03/2019   Procedure: TOTAL KNEE ARTHROPLASTY;  Surgeon: Beverely Low, MD;  Location: [facility];  Service: Orthopedics;  Laterality: Left;  with IS block   TRANSURETHRAL RESECTION OF PROSTATE N/A 09/08/2016   Procedure: TRANSURETHRAL RESECTION OF THE PROSTATE (TURP);  Surgeon: Crist Fat, MD;  Location: [facility];  Service: Urology;  Laterality: N/A;   TRANSURETHRAL RESECTION OF PROSTATE     10-10-17  Dr. Marlou Porch   TRANSURETHRAL RESECTION OF PROSTATE N/A 10/10/2017    Procedure: TRANSURETHRAL RESECTION OF THE PROSTATE (TURP);  Surgeon: Crist Fat, MD;  Location: [facility];  Service: Urology;  Laterality: N/A;   VASECTOMY      Allergies  Allergen Reactions   Lisinopril Cough   Niaspan [Niacin Er (Antihyperlipidemic)] Rash    Outpatient Encounter Medications as of 07/03/2023  Medication Sig   acetaminophen (TYLENOL) 500 MG tablet Take 1 tablet (500 mg total) by mouth 2 (two) times daily as needed.   amLODipine (NORVASC) 10 MG tablet Take 10 mg by mouth daily.   Apoaequorin (PREVAGEN PO) Take 1 tablet by mouth daily.   atorvastatin (LIPITOR) 40 MG tablet Take 40 mg by mouth daily at 6 PM.    Biotin 10 MG TABS Take 10 mg by mouth daily in the afternoon.   cholecalciferol (VITAMIN D3) 25 MCG (1000 UT) tablet Take 1,000 Units by mouth daily.   cloNIDine (CATAPRES) 0.1 MG tablet Take 1 tablet (0.1 mg total) by mouth 2 (two) times daily.   clopidogrel (PLAVIX) 75 MG tablet Take 75 mg by  mouth every evening.    finasteride (PROSCAR) 5 MG tablet Take 5 mg by mouth daily.   gabapentin (NEURONTIN) 100 MG capsule Take 100 mg by mouth 3 (three) times daily.   Glucosamine-Chondroitin 750-600 MG TABS Take 1 tablet by mouth 2 (two) times daily.   hydrALAZINE (APRESOLINE) 25 MG tablet Take 25 mg by mouth every 8 (eight) hours as needed.   hydrALAZINE (APRESOLINE) 50 MG tablet Take by mouth. 75 mg AM and HS 50 mg in afternood   hydrochlorothiazide (MICROZIDE) 12.5 MG capsule Take 2 capsules (25 mg total) by mouth daily.   HYDROcodone bit-homatropine (HYCODAN) 5-1.5 MG/5ML syrup Take 5 mLs by mouth at bedtime as needed for cough.   hydrocortisone ointment 0.5 % Apply 1 Application topically 2 (two) times daily. nystatin with hydrocortisone 0.5% 1:1 ointment; 0.5%; amt: Topically to area; topical rash is healed   iron polysaccharides (NIFEREX) 150 MG capsule Take 150 mg by mouth every Monday, Wednesday, and Friday.   losartan (COZAAR) 50 MG tablet Take 50 mg by  mouth 2 (two) times daily.   melatonin 5 MG TABS Take 1 tablet (5 mg total) by mouth at bedtime.   metoprolol tartrate (LOPRESSOR) 50 MG tablet Take 75 mg by mouth 2 (two) times daily.   Multiple Vitamin (MULTIVITAMIN WITH MINERALS) TABS tablet Take 1 tablet by mouth daily.   ondansetron (ZOFRAN) 4 MG tablet Take 4 mg by mouth every 6 (six) hours as needed for nausea or vomiting.   pantoprazole (PROTONIX) 40 MG tablet Take 40 mg by mouth 2 (two) times daily.   potassium chloride SA (K-DUR,KLOR-CON) 20 MEQ tablet Take 20 mEq by mouth every evening.    tamsulosin (FLOMAX) 0.4 MG CAPS capsule Take 1 capsule (0.4 mg total) by mouth daily after supper.   cetirizine (ZYRTEC) 10 MG tablet Take 1 tablet (10 mg total) by mouth at bedtime. (Patient not taking: Reported on 07/03/2023)   NYAMYC powder Apply 1 Application topically daily. (Patient not taking: Reported on 07/03/2023)   No facility-administered encounter medications on file as of 07/03/2023.    Review of Systems  Constitutional:  Negative for activity change and appetite change.  Respiratory:  Negative for cough and shortness of breath.   Cardiovascular:  Positive for leg swelling. Negative for chest pain.  Musculoskeletal:  Positive for gait problem.  Neurological:  Negative for dizziness and light-headedness.  Psychiatric/Behavioral:  Negative for dysphoric mood. The patient is not nervous/anxious.     Immunization History  Administered Date(s) Administered   Fluad Quad(high Dose 65+) 05/02/2022, 05/22/2023   Influenza, High Dose Seasonal PF 03/31/2017, 03/29/2018   Influenza,inj,Quad PF,6+ Mos 04/25/2016, 05/06/2018   Influenza,inj,quad, With Preservative 05/26/2019   Influenza-Unspecified 05/14/2001, 05/31/2001, 05/14/2002, 05/15/2002, 05/18/2003, 05/25/2004, 04/30/2005, 05/29/2006, 05/01/2007, 05/11/2008, 05/31/2009, 05/31/2010, 04/30/2013, 05/31/2014, 03/31/2021   Moderna Covid-19 Vaccine Bivalent Booster 74yrs & up 08/31/2021,  05/22/2023   Moderna SARS-COV2 Booster Vaccination 06/15/2020, 11/19/2020, 01/02/2022   Moderna Sars-Covid-2 Vaccination 08/12/2019, 09/10/2019, 05/31/2022   Pfizer Covid-19 Vaccine Bivalent Booster 19yrs & up 11/23/2022   Pneumococcal Polysaccharide-23 05/02/2000, 09/24/2018   Rsv, Bivalent, Protein Subunit Rsvpref,pf Verdis Frederickson) 07/19/2022   Td 09/08/2013   Zoster Recombinant(Shingrix) 10/04/2017, 01/23/2018   Pertinent  Health Maintenance Due  Topic Date Due   INFLUENZA VACCINE  Completed      07/13/2022    7:10 AM 07/28/2022    9:00 AM 10/14/2022    7:21 AM 11/28/2022    2:50 PM 06/04/2023    4:59 PM  Fall Risk  Falls in the past year?   1 1 0  Was there an injury with Fall?   1 0 0  Fall Risk Category Calculator   3 1 0  (RETIRED) Patient Fall Risk Level High fall risk High fall risk     Patient at Risk for Falls Due to    History of fall(s);Impaired balance/gait;Impaired mobility No Fall Risks  Fall risk Follow up   Falls evaluation completed Falls evaluation completed;Education provided;Falls prevention discussed Falls evaluation completed   Functional Status Survey:    Vitals:   07/03/23 1141  BP: (!) 178/67  Pulse: 69  Temp: (!) 97.2 F (36.2 C)  Weight: 181 lb 4.8 oz (82.2 kg)  Height: 5\' 4"  (1.626 m)   Body mass index is 31.12 kg/m. Physical Exam Vitals reviewed.  Constitutional:      General: He is not in acute distress. HENT:     Head: Normocephalic.  Eyes:     General:        Right eye: No discharge.        Left eye: No discharge.  Cardiovascular:     Rate and Rhythm: Normal rate and regular rhythm.     Pulses: Normal pulses.     Heart sounds: Normal heart sounds.  Pulmonary:     Effort: Pulmonary effort is normal.     Breath sounds: Normal breath sounds.  Abdominal:     Palpations: Abdomen is soft.  Musculoskeletal:     Cervical back: Neck supple.     Right lower leg: Edema present.     Left lower leg: Edema present.     Comments: Non pitting   Skin:    General: Skin is warm.     Capillary Refill: Capillary refill takes less than 2 seconds.  Neurological:     General: No focal deficit present.     Mental Status: He is alert.  Psychiatric:        Mood and Affect: Mood normal.     Labs reviewed: Recent Labs    07/11/22 0334 07/12/22 0329 07/12/22 0330 07/13/22 0335 07/20/22 0000 10/17/22 0000 01/09/23 0000 03/01/23 1450 06/18/23 0000  NA 142 143 143 145   < > 142 144 142 144  K 3.8 4.6 4.6 4.0   < > 4.0 4.1 4.2 4.3  CL 108 110 110 115*   < > 105 109* 107 110*  CO2 26 25 24 25    < > 26* 22  --  23*  GLUCOSE 129* 179* 181* 186*  --   --   --  109*  --   BUN 16 26* 24* 33*   < > 21 25* 20 29*  CREATININE 0.84 1.32* 1.34* 1.04   < > 1.0 1.1 1.10 1.3  CALCIUM 8.7* 8.7* 8.7* 8.8*   < > 9.6 9.1  --  9.4  MG 2.1 2.4  --  2.2  --   --   --   --   --   PHOS 3.3  --  3.4 2.8  --   --   --   --   --    < > = values in this interval not displayed.   Recent Labs    07/13/22 0335 10/17/22 0000 06/18/23 0000  AST  --  16 16  ALT  --  15 15  ALKPHOS  --  111 91  ALBUMIN 2.9* 4.1 3.9   Recent Labs    07/09/22 1450 07/11/22 0334 07/12/22 0329 07/13/22  0335 07/20/22 0000 07/27/22 0000 10/17/22 0000 03/01/23 1450 06/18/23 0000  WBC 9.0 10.2 14.4* 16.0*   < > 15.5 7.6  --  6.8  NEUTROABS 7.3  --   --   --   --   --   --   --   --   HGB 12.6* 12.2* 10.5* 9.6*   < > 10.5* 12.6* 12.6* 11.6*  HCT 38.5* 37.6* 32.6* 30.0*   < > 33* 36* 37.0* 34*  MCV 87.1 88.3 89.6 89.3  --   --   --   --   --   PLT 257 202 201 205   < > 296 217  --  188   < > = values in this interval not displayed.   Lab Results  Component Value Date   TSH 6.02 (A) 06/18/2023   Lab Results  Component Value Date   HGBA1C 5.5 11/23/2016   Lab Results  Component Value Date   CHOL 133 10/17/2022   HDL 49 10/17/2022   LDLCALC 65 10/17/2022   TRIG 122 10/17/2022   CHOLHDL 2.4 11/23/2016    Significant Diagnostic Results in last 30 days:  No  results found.  Assessment/Plan 1. Lower leg edema - non pitting edema - suspect from sedentary lifestyle - no h/o CHF - refused to try compression stockings - recommend low sodium diet - unable to reduce amlodipine due to ongoing HTN  2. Essential hypertension - uncontrolled, goal < 150/90 - BUN/creat 29/1.3 06/18/2023 - asymptomatic - 11/08 clonidine 0.1 po BID started - will increased clonidine to TID - manual blood pressures TID x 7 days> give report to provider    Family/ staff Communication: plan discussed with patient and nurse  Labs/tests ordered:  manual blood pressures TID x 7 days

## 2023-07-04 DIAGNOSIS — R278 Other lack of coordination: Secondary | ICD-10-CM | POA: Diagnosis not present

## 2023-07-04 DIAGNOSIS — M6281 Muscle weakness (generalized): Secondary | ICD-10-CM | POA: Diagnosis not present

## 2023-07-09 DIAGNOSIS — R278 Other lack of coordination: Secondary | ICD-10-CM | POA: Diagnosis not present

## 2023-07-09 DIAGNOSIS — M6281 Muscle weakness (generalized): Secondary | ICD-10-CM | POA: Diagnosis not present

## 2023-07-11 DIAGNOSIS — R278 Other lack of coordination: Secondary | ICD-10-CM | POA: Diagnosis not present

## 2023-07-11 DIAGNOSIS — M6281 Muscle weakness (generalized): Secondary | ICD-10-CM | POA: Diagnosis not present

## 2023-07-13 ENCOUNTER — Encounter: Payer: Self-pay | Admitting: Adult Health

## 2023-07-13 DIAGNOSIS — M6281 Muscle weakness (generalized): Secondary | ICD-10-CM | POA: Diagnosis not present

## 2023-07-13 DIAGNOSIS — R278 Other lack of coordination: Secondary | ICD-10-CM | POA: Diagnosis not present

## 2023-07-13 NOTE — Progress Notes (Unsigned)
Location:  [facility] Room Number: 149A Place of Service:  SNF [id number]) Provider:  Tamsen Roers, MD  Patient Care Team: Mahlon Gammon, MD as PCP - General (Internal Medicine) Runell Gess, MD as PCP - Cardiology (Cardiology)  Extended Emergency Contact Information Primary Emergency Contact: [name] Address: [address]          [place], [place] [address] [place] of [place] Home Phone: [phone] Mobile Phone: [phone] Relation: Spouse  Code Status:  DNR Goals of care: Advanced Directive information    07/13/2023   10:03 AM  Advanced Directives  Does Patient Have a Medical Advance Directive? Yes  Type of Estate agent of [place];Living will;Out of facility DNR (pink MOST or yellow form)  Does patient want to make changes to medical advance directive? No - Patient declined  Copy of Healthcare Power of Attorney in Chart? Yes - validated most recent copy scanned in chart (See row information)     Chief Complaint  Patient presents with  . Medical Management of Chronic Issues    Patient is being seen for routine     HPI:  Pt is a 89 y.o. male seen today for medical management of chronic diseases.     Past Medical History:  Diagnosis Date  . AAA (abdominal aortic aneurysm) (HCC)    asymptomatic  . AAA (abdominal aortic aneurysm) (HCC) 2010   peripheral  angiogram-- bilateral SFA DISEASE  and 60 to 70% infrarenaal abd. aortic stenosis with 15 -mm gradient  . Adenomatous colon polyp 11/2003  . CAD (coronary artery disease)   . Gait abnormality 02/13/2017  . Gastroparesis    pt denies  . GERD (gastroesophageal reflux disease)    w/ LPR  . History of kidney stones    x2  . Hyperlipidemia   . Hypertension   . IDA (iron deficiency anemia)   . Peripheral arterial disease (HCC)    RCEA  by Dr [name]  . PUD (peptic ulcer disease)    pt unaware  .  Vertigo    Past Surgical History:  Procedure Laterality Date  . ABDOMINAL AORTOGRAM W/LOWER EXTREMITY Bilateral 08/05/2018   Procedure: ABDOMINAL AORTOGRAM W/LOWER EXTREMITY;  Surgeon: Runell Gess, MD;  Location: [facility] INVASIVE CV LAB;  Service: Cardiovascular;  Laterality: Bilateral;  . ABDOMINAL AORTOGRAM W/LOWER EXTREMITY N/A 09/26/2021   Procedure: ABDOMINAL AORTOGRAM W/LOWER EXTREMITY;  Surgeon: Runell Gess, MD;  Location: [facility] INVASIVE CV LAB;  Service: Cardiovascular;  Laterality: N/A;  . AORTOGRAM  04/24/2016    Abdominal aortogram, bilateral iliac angiogram, bifemoral runoff  . CARDIAC CATHETERIZATION  05/12/2005   RCA  . carotid doppler  01/24/2013   RICA endarterectomy,left CCA 0-49%; left bulb and prox ICA 50-69%; bilaateral subclavian < 50%  . CAROTID ENDARTERECTOMY  11/01/2010  . CATARACT EXTRACTION    . COLONOSCOPY    . CORONARY ANGIOPLASTY  05/18/2005   2 STENTS distal RCA AND PROXIMAL-MID RCA   5 total stents per pt.  . CYSTOSCOPY/URETEROSCOPY/HOLMIUM LASER/STENT PLACEMENT Left 01/11/2018   Procedure: LEFT URETEROSCOPY/HOLMIUM LASER/STENT PLACEMENT;  Surgeon: Crist Fat, MD;  Location: [facility];  Service: Urology;  Laterality: Left;  . DOPPLER ECHOCARDIOGRAPHY  02/07/2012   EF 55%,SHOWED NO ISCHEMIA   . HERNIA REPAIR     umbilical  . lower arterial  doppler  02/04/2013   aotra 1.5 x 1.5 cm; distal abd aorta 70-99%,proximal common iliac arteries -very stenotic with increased velocities>50%,may  be falsely elevated as a result of residual plaque from the distal aorta stenosis  . lower extremity doppler  June 18 ,2013   ABI'S ABNORMAL, RABI was 0.88 and LABI 0.75 ,with 3-vessel  run off  . NM MYOVIEW LTD  MAY [id number]   showed no significant ischemia;  . NM MYOVIEW LTD  04/22/2008   ef 77%,exercise capcity 6 METS ,exaggerated blood pressure response to exercise  . PERIPHERAL VASCULAR CATHETERIZATION N/A 04/24/2016   Procedure: Lower Extremity Angiography;  Surgeon:  Runell Gess, MD;  Location: [facility] INVASIVE CV LAB;  Service: Cardiovascular;  Laterality: N/A;  . PERIPHERAL VASCULAR CATHETERIZATION  04/24/2016   Procedure: Peripheral Vascular Intervention;  Surgeon: Runell Gess, MD;  Location: [facility] INVASIVE CV LAB;  Service: Cardiovascular;;  Aorta  . retrograde central aortic catheterization  05/19/2005   cutting balloon atherectomy, c-circ stenosis with DES STENTING CYPHER  . TONSILLECTOMY    . TOTAL HIP ARTHROPLASTY Right 07/11/2022   Procedure: TOTAL HIP ARTHROPLASTY ANTERIOR APPROACH;  Surgeon: Durene Romans, MD;  Location: [facility];  Service: Orthopedics;  Laterality: Right;  . TOTAL KNEE ARTHROPLASTY Left 01/03/2019   Procedure: TOTAL KNEE ARTHROPLASTY;  Surgeon: Beverely Low, MD;  Location: [facility];  Service: Orthopedics;  Laterality: Left;  with IS block  . TRANSURETHRAL RESECTION OF PROSTATE N/A 09/08/2016   Procedure: TRANSURETHRAL RESECTION OF THE PROSTATE (TURP);  Surgeon: Crist Fat, MD;  Location: [facility];  Service: Urology;  Laterality: N/A;  . TRANSURETHRAL RESECTION OF PROSTATE     10-10-17  Dr. Marlou Porch  . TRANSURETHRAL RESECTION OF PROSTATE N/A 10/10/2017   Procedure: TRANSURETHRAL RESECTION OF THE PROSTATE (TURP);  Surgeon: Crist Fat, MD;  Location: [facility];  Service: Urology;  Laterality: N/A;  . VASECTOMY      Allergies  Allergen Reactions  . Lisinopril Cough  . Niaspan [Niacin Er (Antihyperlipidemic)] Rash    Outpatient Encounter Medications as of 07/13/2023  Medication Sig  . acetaminophen (TYLENOL) 500 MG tablet Take 1 tablet (500 mg total) by mouth 2 (two) times daily as needed.  [name] amLODipine (NORVASC) 10 MG tablet Take 10 mg by mouth daily.  [name] Apoaequorin (PREVAGEN PO) Take 1 tablet by mouth daily.  [name] atorvastatin (LIPITOR) 40 MG tablet Take 40 mg by mouth daily at 6 PM.   . Biotin 10 MG TABS Take 10 mg by mouth daily in the afternoon.  . cetirizine (ZYRTEC) 10 MG tablet Take 1 tablet (10 mg total) by mouth  at bedtime.  . cholecalciferol (VITAMIN D3) 25 MCG (1000 UT) tablet Take 1,000 Units by mouth daily.  . cloNIDine (CATAPRES) 0.1 MG tablet Take 1 tablet (0.1 mg total) by mouth 2 (two) times daily.  . clopidogrel (PLAVIX) 75 MG tablet Take 75 mg by mouth every evening.   . finasteride (PROSCAR) 5 MG tablet Take 5 mg by mouth daily.  [name] gabapentin (NEURONTIN) 100 MG capsule Take 100 mg by mouth 3 (three) times daily.  . Glucosamine-Chondroitin 750-600 MG TABS Take 1 tablet by mouth 2 (two) times daily.  . hydrALAZINE (APRESOLINE) 25 MG tablet Take 25 mg by mouth every 8 (eight) hours as needed.  . hydrALAZINE (APRESOLINE) 50 MG tablet Take by mouth. 75 mg AM and HS 50 mg in afternood  . hydrochlorothiazide (MICROZIDE) 12.5 MG capsule Take 2 capsules (25 mg total) by mouth daily.  [name] HYDROcodone bit-homatropine (HYCODAN) 5-1.5 MG/5ML syrup Take 5 mLs by mouth at bedtime as needed for cough.  . hydrocortisone ointment  0.5 % Apply 1 Application topically 2 (two) times daily. nystatin with hydrocortisone 0.5% 1:1 ointment; 0.5%; amt: Topically to area; topical rash is healed  . iron polysaccharides (NIFEREX) 150 MG capsule Take 150 mg by mouth every Monday, Wednesday, and Friday.  . losartan (COZAAR) 50 MG tablet Take 50 mg by mouth 2 (two) times daily.  . melatonin 5 MG TABS Take 1 tablet (5 mg total) by mouth at bedtime.  . metoprolol tartrate (LOPRESSOR) 50 MG tablet Take 75 mg by mouth 2 (two) times daily.  . Multiple Vitamin (MULTIVITAMIN WITH MINERALS) TABS tablet Take 1 tablet by mouth daily.  [name] NYAMYC powder Apply 1 Application topically daily.  . ondansetron (ZOFRAN) 4 MG tablet Take 4 mg by mouth every 6 (six) hours as needed for nausea or vomiting.  . pantoprazole (PROTONIX) 40 MG tablet Take 40 mg by mouth 2 (two) times daily.  . potassium chloride SA (K-DUR,KLOR-CON) 20 MEQ tablet Take 20 mEq by mouth every evening.   . tamsulosin (FLOMAX) 0.4 MG CAPS capsule Take 1 capsule (0.4 mg  total) by mouth daily after supper.   No facility-administered encounter medications on file as of 07/13/2023.    Review of Systems  Immunization History  Administered Date(s) Administered  . Fluad Quad(high Dose 65+) 05/02/2022, 05/22/2023  . Influenza, High Dose Seasonal PF 03/31/2017, 03/29/2018  . Influenza,inj,Quad PF,6+ Mos 04/25/2016, 05/06/2018  . Influenza,inj,quad, With Preservative 05/26/2019  . Influenza-Unspecified 05/14/2001, 05/31/2001, 05/14/2002, 05/15/2002, 05/18/2003, 05/25/2004, 04/30/2005, 05/29/2006, 05/01/2007, 05/11/2008, 05/31/2009, 05/31/2010, 04/30/2013, 05/31/2014, 03/31/2021  . Moderna Covid-19 Vaccine Bivalent Booster 3yrs & up 08/31/2021, 05/22/2023  . Moderna SARS-COV2 Booster Vaccination 06/15/2020, 11/19/2020, 01/02/2022  . Moderna Sars-Covid-2 Vaccination 08/12/2019, 09/10/2019, 05/31/2022  . 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 Pneumococcal Polysaccharide-23 05/02/2000, 09/24/2018  . Rsv, Bivalent, Protein Subunit Rsvpref,pf Verdis Frederickson) 07/19/2022  . Td 09/08/2013  . Zoster Recombinant(Shingrix) 10/04/2017, 01/23/2018   Pertinent  Health Maintenance Due  Topic Date Due  . INFLUENZA VACCINE  Completed      07/13/2022    7:10 AM 07/28/2022    9:00 AM 10/14/2022    7:21 AM 11/28/2022    2:50 PM 06/04/2023    4:59 PM  Fall Risk  Falls in the past year?   1 1 0  Was there an injury with Fall?   1 0 0  Fall Risk Category Calculator   3 1 0  (RETIRED) Patient Fall Risk Level High fall risk High fall risk     Patient at Risk for Falls Due to    History of fall(s);Impaired balance/gait;Impaired mobility No Fall Risks  Fall risk Follow up   Falls evaluation completed Falls evaluation completed;Education provided;Falls prevention discussed Falls evaluation completed   Functional Status Survey:    Vitals:   07/13/23 0949  BP: (!) 164/62  Pulse: 69  Resp: 17  Temp: (!) 97.2 F (36.2 C)  TempSrc: Temporal  SpO2: 92%   Weight: 181 lb 4.8 oz (82.2 kg)  Height: 5\' 4"  (1.626 m)   Body mass index is 31.12 kg/m. Physical Exam  Labs reviewed: Recent Labs    10/17/22 0000 01/09/23 0000 03/01/23 1450 06/18/23 0000  NA 142 144 142 144  K 4.0 4.1 4.2 4.3  CL 105 109* 107 110*  CO2 26* 22  --  23*  GLUCOSE  --   --  109*  --   BUN 21 25* 20 29*  CREATININE 1.0 1.1 1.10 1.3  CALCIUM 9.6 9.1  --  9.4   Recent Labs    10/17/22 0000 06/18/23 0000  AST 16 16  ALT 15 15  ALKPHOS 111 91  ALBUMIN 4.1 3.9   Recent Labs    07/27/22 0000 10/17/22 0000 03/01/23 1450 06/18/23 0000  WBC 15.5 7.6  --  6.8  HGB 10.5* 12.6* 12.6* 11.6*  HCT 33* 36* 37.0* 34*  PLT 296 217  --  188   Lab Results  Component Value Date   TSH 6.02 (A) 06/18/2023   Lab Results  Component Value Date   HGBA1C 5.5 11/23/2016   Lab Results  Component Value Date   CHOL 133 10/17/2022   HDL 49 10/17/2022   LDLCALC 65 10/17/2022   TRIG 122 10/17/2022   CHOLHDL 2.4 11/23/2016    Significant Diagnostic Results in last 30 days:  No results found.  Assessment/Plan There are no diagnoses linked to this encounter.      This encounter was created in error - please disregard.

## 2023-07-16 ENCOUNTER — Non-Acute Institutional Stay (SKILLED_NURSING_FACILITY): Payer: Medicare Other | Admitting: Adult Health

## 2023-07-16 DIAGNOSIS — N1831 Chronic kidney disease, stage 3a: Secondary | ICD-10-CM | POA: Diagnosis not present

## 2023-07-16 DIAGNOSIS — I10 Essential (primary) hypertension: Secondary | ICD-10-CM

## 2023-07-16 DIAGNOSIS — R600 Localized edema: Secondary | ICD-10-CM

## 2023-07-16 MED ORDER — CLONIDINE HCL 0.1 MG PO TABS
0.1000 mg | ORAL_TABLET | Freq: Three times a day (TID) | ORAL | Status: DC
Start: 2023-07-16 — End: 2023-12-12

## 2023-07-16 MED ORDER — CHLORTHALIDONE 25 MG PO TABS: 25.0000 mg | ORAL_TABLET | Freq: Every day | ORAL | Status: DC

## 2023-07-16 NOTE — Progress Notes (Unsigned)
Location:  [facility] Room Number: 149A Place of Service:  SNF [phone]) Provider:  Fletcher Anon ,NP  Mahlon Gammon, MD  Patient Care Team: Mahlon Gammon, MD as PCP - General (Internal Medicine) Runell Gess, MD as PCP - Cardiology (Cardiology)  Extended Emergency Contact Information Primary Emergency Contact: [name] Address: [address]          [place], [place] [address] Darden [place] of [place] Home Phone: [phone] Mobile Phone: [phone] Relation: Spouse  Code Status:  DNR Goals of care: Advanced Directive information    07/16/2023   10:59 AM  Advanced Directives  Does Patient Have a Medical Advance Directive? Yes  Type of Estate agent of [place];Living will;Out of facility DNR (pink MOST or yellow form)  Does patient want to make changes to medical advance directive? No - Patient declined  Copy of Healthcare Power of Attorney in Chart? Yes - validated most recent copy scanned in chart (See row information)     Chief Complaint  Patient presents with   Acute Visit    Patient is being seen for hypertension     HPI:  Pt is a 89 y.o. male seen today for an acute visit for HTN  PMH of CAD, PAD followed with Dr Allyson Sabal, IDA, GERD, HTN, PVD and HLD He had a mechanical fall and subsequent severe right hip pain and inability to bear weight, and impacted subcapital femoral neck fracture. He underwent right THA by Dr. Charlann Boxer on 07/11/2022.  BP still running in the 140-180 range systolic. Has some edema in his legs. No sob. No dizziness.   Clonidine was increased to 0.1 mg TID on 12/3. Mild improvement noted.   Has lumbar stenosis and hx of leg weakness with peripheral neuropathy Hx of AAA ICA stenosis and PAD, followed by Dr Allyson Sabal. Last carotid US showing bilateral ICA stenosis with progression. Repeat ordered 12 months.  Renal artery stenosis left 90%. Also has femoral artery stenosis.   Currently denies claudication but has limited ambulation and poor memory  PAD: ABI 09/29/21 Right: Resting right ankle-brachial index indicates mild right lower  extremity arterial disease. The right toe-brachial index is abnormal.   Left: Resting left ankle-brachial index indicates moderate left lower  extremity arterial disease. The left toe-brachial index is abnormal.   He walks with a walker short distances and uses a scooter for long distances. Needs assistance with bathing, dressing. Able to feed himself. Very pleasant. Incontinent intermittently.    He has a tremor with intention, seems slightly worse over time. He denies that it is affecting his QOL or ADls.   He has a hx of urinary retention  Hx of hydronephrosis and stent. No current issues.   Memory loss: MMSE 26/30 11/2022  Chronic cough on hycodan, hx of cough due to gerd. No lung disease noted per note by pulmonary 2019. ON PPI.    Past Medical History:  Diagnosis Date   AAA (abdominal aortic aneurysm) (HCC)    asymptomatic   AAA (abdominal aortic aneurysm) (HCC) 2010   peripheral  angiogram-- bilateral SFA DISEASE  and 60 to 70% infrarenaal abd. aortic stenosis with 15 -mm gradient   Adenomatous colon polyp 11/2003   CAD (coronary artery disease)    Gait abnormality 02/13/2017   Gastroparesis    pt denies   GERD (gastroesophageal reflux disease)    w/ LPR   History of kidney stones    x2   Hyperlipidemia  Hypertension    IDA (iron deficiency anemia)    Peripheral arterial disease (HCC)    RCEA  by Dr [name]   PUD (peptic ulcer disease)    pt unaware   Vertigo    Past Surgical History:  Procedure Laterality Date   ABDOMINAL AORTOGRAM W/LOWER EXTREMITY Bilateral 08/05/2018   Procedure: ABDOMINAL AORTOGRAM W/LOWER EXTREMITY;  Surgeon: Runell Gess, MD;  Location: [facility] INVASIVE CV LAB;  Service: Cardiovascular;  Laterality: Bilateral;   ABDOMINAL AORTOGRAM W/LOWER EXTREMITY N/A 09/26/2021    Procedure: ABDOMINAL AORTOGRAM W/LOWER EXTREMITY;  Surgeon: Runell Gess, MD;  Location: [facility] INVASIVE CV LAB;  Service: Cardiovascular;  Laterality: N/A;   AORTOGRAM  04/24/2016    Abdominal aortogram, bilateral iliac angiogram, bifemoral runoff   CARDIAC CATHETERIZATION  05/12/2005   RCA   carotid doppler  01/24/2013   RICA endarterectomy,left CCA 0-49%; left bulb and prox ICA 50-69%; bilaateral subclavian < 50%   CAROTID ENDARTERECTOMY  11/01/2010   CATARACT EXTRACTION     COLONOSCOPY     CORONARY ANGIOPLASTY  05/18/2005   2 STENTS distal RCA AND PROXIMAL-MID RCA   5 total stents per pt.   CYSTOSCOPY/URETEROSCOPY/HOLMIUM LASER/STENT PLACEMENT Left 01/11/2018   Procedure: LEFT URETEROSCOPY/HOLMIUM LASER/STENT PLACEMENT;  Surgeon: Crist Fat, MD;  Location: [facility] ORS;  Service: Urology;  Laterality: Left;   DOPPLER ECHOCARDIOGRAPHY  02/07/2012   EF 55%,SHOWED NO ISCHEMIA    HERNIA REPAIR     umbilical   lower arterial  doppler  02/04/2013   aotra 1.5 x 1.5 cm; distal abd aorta 70-99%,proximal common iliac arteries -very stenotic with increased velocities>50%,may be falsely elevated as a result of residual plaque from the distal aorta stenosis   lower extremity doppler  June 18 ,2013   ABI'S ABNORMAL, RABI was 0.88 and LABI 0.75 ,with 3-vessel  run off   NM MYOVIEW LTD  MAY 23,2011   showed no significant ischemia;   NM MYOVIEW LTD  04/22/2008   ef 77%,exercise capcity 6 METS ,exaggerated blood pressure response to exercise   PERIPHERAL VASCULAR CATHETERIZATION N/A 04/24/2016   Procedure: Lower Extremity Angiography;  Surgeon: Runell Gess, MD;  Location: [facility] INVASIVE CV LAB;  Service: Cardiovascular;  Laterality: N/A;   PERIPHERAL VASCULAR CATHETERIZATION  04/24/2016   Procedure: Peripheral Vascular Intervention;  Surgeon: Runell Gess, MD;  Location: [facility] INVASIVE CV LAB;  Service: Cardiovascular;;  Aorta   retrograde central aortic catheterization  05/19/2005   cutting balloon  atherectomy, c-circ stenosis with DES STENTING CYPHER   TONSILLECTOMY     TOTAL HIP ARTHROPLASTY Right 07/11/2022   Procedure: TOTAL HIP ARTHROPLASTY ANTERIOR APPROACH;  Surgeon: Durene Romans, MD;  Location: [facility];  Service: Orthopedics;  Laterality: Right;   TOTAL KNEE ARTHROPLASTY Left 01/03/2019   Procedure: TOTAL KNEE ARTHROPLASTY;  Surgeon: Beverely Low, MD;  Location: [facility];  Service: Orthopedics;  Laterality: Left;  with IS block   TRANSURETHRAL RESECTION OF PROSTATE N/A 09/08/2016   Procedure: TRANSURETHRAL RESECTION OF THE PROSTATE (TURP);  Surgeon: Crist Fat, MD;  Location: [facility];  Service: Urology;  Laterality: N/A;   TRANSURETHRAL RESECTION OF PROSTATE     10-10-17  Dr. Marlou Porch   TRANSURETHRAL RESECTION OF PROSTATE N/A 10/10/2017   Procedure: TRANSURETHRAL RESECTION OF THE PROSTATE (TURP);  Surgeon: Crist Fat, MD;  Location: [facility];  Service: Urology;  Laterality: N/A;   VASECTOMY      Allergies  Allergen Reactions   Lisinopril Cough   Niaspan [Niacin Er (  Antihyperlipidemic)] Rash    Outpatient Encounter Medications as of 07/16/2023  Medication Sig   acetaminophen (TYLENOL) 500 MG tablet Take 1 tablet (500 mg total) by mouth 2 (two) times daily as needed.   amLODipine (NORVASC) 10 MG tablet Take 10 mg by mouth daily.   Apoaequorin (PREVAGEN PO) Take 1 tablet by mouth daily.   atorvastatin (LIPITOR) 40 MG tablet Take 40 mg by mouth daily at 6 PM.    Biotin 10 MG TABS Take 10 mg by mouth daily in the afternoon.   cetirizine (ZYRTEC) 10 MG tablet Take 1 tablet (10 mg total) by mouth at bedtime.   cholecalciferol (VITAMIN D3) 25 MCG (1000 UT) tablet Take 1,000 Units by mouth daily.   cloNIDine (CATAPRES) 0.1 MG tablet Take 1 tablet (0.1 mg total) by mouth 2 (two) times daily.   clopidogrel (PLAVIX) 75 MG tablet Take 75 mg by mouth every evening.    finasteride (PROSCAR) 5 MG tablet Take 5 mg by mouth daily.   gabapentin (NEURONTIN) 100 MG capsule Take 100 mg  by mouth 3 (three) times daily.   Glucosamine-Chondroitin 750-600 MG TABS Take 1 tablet by mouth 2 (two) times daily.   hydrALAZINE (APRESOLINE) 25 MG tablet Take 25 mg by mouth every 8 (eight) hours as needed.   hydrALAZINE (APRESOLINE) 50 MG tablet Take by mouth. 75 mg AM and HS 50 mg in afternood   hydrochlorothiazide (MICROZIDE) 12.5 MG capsule Take 2 capsules (25 mg total) by mouth daily.   HYDROcodone bit-homatropine (HYCODAN) 5-1.5 MG/5ML syrup Take 5 mLs by mouth at bedtime as needed for cough.   hydrocortisone ointment 0.5 % Apply 1 Application topically 2 (two) times daily. nystatin with hydrocortisone 0.5% 1:1 ointment; 0.5%; amt: Topically to area; topical rash is healed   iron polysaccharides (NIFEREX) 150 MG capsule Take 150 mg by mouth every Monday, Wednesday, and Friday.   losartan (COZAAR) 50 MG tablet Take 50 mg by mouth 2 (two) times daily.   melatonin 5 MG TABS Take 1 tablet (5 mg total) by mouth at bedtime.   metoprolol tartrate (LOPRESSOR) 50 MG tablet Take 75 mg by mouth 2 (two) times daily.   Multiple Vitamin (MULTIVITAMIN WITH MINERALS) TABS tablet Take 1 tablet by mouth daily.   NYAMYC powder Apply 1 Application topically daily.   ondansetron (ZOFRAN) 4 MG tablet Take 4 mg by mouth every 6 (six) hours as needed for nausea or vomiting.   pantoprazole (PROTONIX) 40 MG tablet Take 40 mg by mouth 2 (two) times daily.   potassium chloride SA (K-DUR,KLOR-CON) 20 MEQ tablet Take 20 mEq by mouth every evening.    tamsulosin (FLOMAX) 0.4 MG CAPS capsule Take 1 capsule (0.4 mg total) by mouth daily after supper.   No facility-administered encounter medications on file as of 07/16/2023.    Review of Systems  Constitutional:  Negative for activity change, appetite change, chills, diaphoresis, fatigue, fever and unexpected weight change.  Respiratory:  Negative for cough, shortness of breath, wheezing and stridor.   Cardiovascular:  Positive for leg swelling. Negative for chest  pain and palpitations.  Gastrointestinal:  Negative for abdominal distention, abdominal pain, constipation and diarrhea.  Genitourinary:  Negative for difficulty urinating and dysuria.  Musculoskeletal:  Positive for gait problem. Negative for arthralgias, back pain, joint swelling and myalgias.  Neurological:  Negative for dizziness, seizures, syncope, facial asymmetry, speech difficulty, weakness and headaches.  Hematological:  Negative for adenopathy. Does not bruise/bleed easily.  Psychiatric/Behavioral:  Negative for agitation, behavioral problems  and confusion.        Memory loss    Immunization History  Administered Date(s) Administered   Fluad Quad(high Dose 65+) 05/02/2022, 05/22/2023   Influenza, High Dose Seasonal PF 03/31/2017, 03/29/2018   Influenza,inj,Quad PF,6+ Mos 04/25/2016, 05/06/2018   Influenza,inj,quad, With Preservative 05/26/2019   Influenza-Unspecified 05/14/2001, 05/31/2001, 05/14/2002, 05/15/2002, 05/18/2003, 05/25/2004, 04/30/2005, 05/29/2006, 05/01/2007, 05/11/2008, 05/31/2009, 05/31/2010, 04/30/2013, 05/31/2014, 03/31/2021   Moderna Covid-19 Vaccine Bivalent Booster 25yrs & up 08/31/2021, 05/22/2023   Moderna SARS-COV2 Booster Vaccination 06/15/2020, 11/19/2020, 01/02/2022   Moderna Sars-Covid-2 Vaccination 08/12/2019, 09/10/2019, 05/31/2022   Pfizer Covid-19 Vaccine Bivalent Booster 51yrs & up 11/23/2022   Pneumococcal Polysaccharide-23 05/02/2000, 09/24/2018   Rsv, Bivalent, Protein Subunit Rsvpref,pf Verdis Frederickson) 07/19/2022   Td 09/08/2013   Zoster Recombinant(Shingrix) 10/04/2017, 01/23/2018   Pertinent  Health Maintenance Due  Topic Date Due   INFLUENZA VACCINE  Completed      07/13/2022    7:10 AM 07/28/2022    9:00 AM 10/14/2022    7:21 AM 11/28/2022    2:50 PM 06/04/2023    4:59 PM  Fall Risk  Falls in the past year?   1 1 0  Was there an injury with Fall?   1 0 0  Fall Risk Category Calculator   3 1 0  (RETIRED) Patient Fall Risk Level High  fall risk High fall risk     Patient at Risk for Falls Due to    History of fall(s);Impaired balance/gait;Impaired mobility No Fall Risks  Fall risk Follow up   Falls evaluation completed Falls evaluation completed;Education provided;Falls prevention discussed Falls evaluation completed   Functional Status Survey:    Vitals:   07/16/23 1057  BP: (!) 143/63  Pulse: 69  Resp: 17  Temp: (!) 97.2 F (36.2 C)  TempSrc: Temporal  SpO2: 92%  Weight: 181 lb 4.8 oz (82.2 kg)  Height: 5\' 4"  (1.626 m)   Body mass index is 31.12 kg/m. Physical Exam Vitals and nursing note reviewed.  Constitutional:      General: He is not in acute distress.    Appearance: He is not diaphoretic.  HENT:     Head: Normocephalic and atraumatic.  Neck:     Thyroid: No thyromegaly.     Vascular: No JVD.     Trachea: No tracheal deviation.  Cardiovascular:     Rate and Rhythm: Normal rate and regular rhythm.     Heart sounds: No murmur heard. Pulmonary:     Effort: Pulmonary effort is normal. No respiratory distress.     Breath sounds: Normal breath sounds. No wheezing.  Abdominal:     General: Bowel sounds are normal. There is no distension.     Palpations: Abdomen is soft.     Tenderness: There is no abdominal tenderness.  Musculoskeletal:     Comments: BLE edema +1  Lymphadenopathy:     Cervical: No cervical adenopathy.  Skin:    General: Skin is warm and dry.  Neurological:     Mental Status: He is alert and oriented to person, place, and time.     Cranial Nerves: No cranial nerve deficit.  Psychiatric:        Mood and Affect: Mood normal.     Labs reviewed: Recent Labs    10/17/22 0000 01/09/23 0000 03/01/23 1450 06/18/23 0000  NA 142 144 142 144  K 4.0 4.1 4.2 4.3  CL 105 109* 107 110*  CO2 26* 22  --  23*  GLUCOSE  --   --  109*  --   BUN 21 25* 20 29*  CREATININE 1.0 1.1 1.10 1.3  CALCIUM 9.6 9.1  --  9.4   Recent Labs    10/17/22 0000 06/18/23 0000  AST 16 16  ALT 15  15  ALKPHOS 111 91  ALBUMIN 4.1 3.9   Recent Labs    07/27/22 0000 10/17/22 0000 03/01/23 1450 06/18/23 0000  WBC 15.5 7.6  --  6.8  HGB 10.5* 12.6* 12.6* 11.6*  HCT 33* 36* 37.0* 34*  PLT 296 217  --  188   Lab Results  Component Value Date   TSH 6.02 (A) 06/18/2023   Lab Results  Component Value Date   HGBA1C 5.5 11/23/2016   Lab Results  Component Value Date   CHOL 133 10/17/2022   HDL 49 10/17/2022   LDLCALC 65 10/17/2022   TRIG 122 10/17/2022   CHOLHDL 2.4 11/23/2016    Significant Diagnostic Results in last 30 days:  No results found.  Assessment/Plan  1. Essential hypertension (Primary) Difficult to control D/C hydrochlorothiazide Add chlorthalidone 25 mg every day then increase to 50 mg every day BMP weekly x 2  2. Localized edema Declined compression hose Would not taper norvasc due to HTN Change diuretic as above Discussed low sodium diet Leg elevation   3. Stage 3a chronic kidney disease (HCC) Continue to periodically monitor BMP and avoid nephrotoxic agents    Family/ staff Communication: nurse  Labs/tests ordered:  BMP on 1 week then again in 1` more week

## 2023-07-17 DIAGNOSIS — M6281 Muscle weakness (generalized): Secondary | ICD-10-CM | POA: Diagnosis not present

## 2023-07-17 DIAGNOSIS — R278 Other lack of coordination: Secondary | ICD-10-CM | POA: Diagnosis not present

## 2023-07-18 DIAGNOSIS — M6281 Muscle weakness (generalized): Secondary | ICD-10-CM | POA: Diagnosis not present

## 2023-07-18 DIAGNOSIS — R278 Other lack of coordination: Secondary | ICD-10-CM | POA: Diagnosis not present

## 2023-07-19 ENCOUNTER — Encounter: Payer: Self-pay | Admitting: Adult Health

## 2023-07-22 DIAGNOSIS — M6281 Muscle weakness (generalized): Secondary | ICD-10-CM | POA: Diagnosis not present

## 2023-07-22 DIAGNOSIS — R278 Other lack of coordination: Secondary | ICD-10-CM | POA: Diagnosis not present

## 2023-07-23 DIAGNOSIS — I10 Essential (primary) hypertension: Secondary | ICD-10-CM | POA: Diagnosis not present

## 2023-07-24 DIAGNOSIS — M6281 Muscle weakness (generalized): Secondary | ICD-10-CM | POA: Diagnosis not present

## 2023-07-24 DIAGNOSIS — R278 Other lack of coordination: Secondary | ICD-10-CM | POA: Diagnosis not present

## 2023-07-29 DIAGNOSIS — M6281 Muscle weakness (generalized): Secondary | ICD-10-CM | POA: Diagnosis not present

## 2023-07-29 DIAGNOSIS — R278 Other lack of coordination: Secondary | ICD-10-CM | POA: Diagnosis not present

## 2023-07-30 DIAGNOSIS — M6281 Muscle weakness (generalized): Secondary | ICD-10-CM | POA: Diagnosis not present

## 2023-07-30 DIAGNOSIS — I10 Essential (primary) hypertension: Secondary | ICD-10-CM | POA: Diagnosis not present

## 2023-07-30 DIAGNOSIS — R278 Other lack of coordination: Secondary | ICD-10-CM | POA: Diagnosis not present

## 2023-07-30 LAB — BASIC METABOLIC PANEL
BUN: 34 — AB (ref 4–21)
CO2: 22 (ref 13–22)
Chloride: 107 (ref 99–108)
Creatinine: 1.4 — AB (ref 0.6–1.3)
Glucose: 103
Potassium: 4.2 meq/L (ref 3.5–5.1)
Sodium: 139 (ref 137–147)

## 2023-07-30 LAB — COMPREHENSIVE METABOLIC PANEL
Calcium: 8.6 — AB (ref 8.7–10.7)
eGFR: 49

## 2023-07-31 ENCOUNTER — Ambulatory Visit: Payer: Medicare Other | Admitting: Physician Assistant

## 2023-07-31 DIAGNOSIS — M6281 Muscle weakness (generalized): Secondary | ICD-10-CM | POA: Diagnosis not present

## 2023-07-31 DIAGNOSIS — R278 Other lack of coordination: Secondary | ICD-10-CM | POA: Diagnosis not present

## 2023-08-02 DIAGNOSIS — R278 Other lack of coordination: Secondary | ICD-10-CM | POA: Diagnosis not present

## 2023-08-02 DIAGNOSIS — M6281 Muscle weakness (generalized): Secondary | ICD-10-CM | POA: Diagnosis not present

## 2023-08-02 DIAGNOSIS — M6389 Disorders of muscle in diseases classified elsewhere, multiple sites: Secondary | ICD-10-CM | POA: Diagnosis not present

## 2023-08-23 ENCOUNTER — Other Ambulatory Visit: Payer: Self-pay | Admitting: Adult Health

## 2023-08-23 DIAGNOSIS — R053 Chronic cough: Secondary | ICD-10-CM

## 2023-08-23 MED ORDER — HYDROCODONE BIT-HOMATROP MBR 5-1.5 MG/5ML PO SOLN: 5.0000 mL | Freq: Every evening | ORAL | 0 refills | Status: DC | PRN

## 2023-08-24 ENCOUNTER — Encounter: Payer: Self-pay | Admitting: Physician Assistant

## 2023-08-24 ENCOUNTER — Ambulatory Visit: Payer: Medicare Other | Attending: Physician Assistant | Admitting: Physician Assistant

## 2023-08-24 VITALS — BP 128/64 | HR 49 | Ht 64.0 in | Wt 184.4 lb

## 2023-08-24 DIAGNOSIS — R600 Localized edema: Secondary | ICD-10-CM

## 2023-08-24 DIAGNOSIS — I10 Essential (primary) hypertension: Secondary | ICD-10-CM

## 2023-08-24 DIAGNOSIS — I6523 Occlusion and stenosis of bilateral carotid arteries: Secondary | ICD-10-CM | POA: Diagnosis not present

## 2023-08-24 DIAGNOSIS — I2510 Atherosclerotic heart disease of native coronary artery without angina pectoris: Secondary | ICD-10-CM | POA: Diagnosis not present

## 2023-08-24 DIAGNOSIS — E785 Hyperlipidemia, unspecified: Secondary | ICD-10-CM | POA: Diagnosis not present

## 2023-08-24 DIAGNOSIS — Z79899 Other long term (current) drug therapy: Secondary | ICD-10-CM | POA: Diagnosis not present

## 2023-08-24 MED ORDER — CHLORTHALIDONE 25 MG PO TABS: 50.0000 mg | ORAL_TABLET | Freq: Every day | ORAL | Status: DC

## 2023-08-24 NOTE — Progress Notes (Unsigned)
Cardiology Office Note:  .   Date:  08/24/2023  ID:  Nathan Santiago, DOB 09/03/1930, MRN [id number] PCP: Mahlon Gammon, MD  [facility] Providers Cardiologist:  Nanetta Batty, MD { Click to update primary MD,subspecialty MD or APP then REFRESH:1}   History of Present Illness: .   Nathan Santiago is a 89 y.o. male with a hx of CAD, AAA, hypertension, hyperlipidemia, carotid artery disease and PAD.  He was a former patient of Dr. Kandis Cocking.  He previously underwent stenting of proximal, mid and distal RCA using overlapping stents as well as proximal AV groove circumflex with known occluded OM branch in 2006.  Myoview was nonischemic in 2010.  Echocardiogram in July 2013 showed normal EF.  He previously underwent right CEA by vascular surgery for carotid artery disease.  He underwent lower extremity angiography in September 2017 revealing 95% distal abdominal aortic stenosis with 60 mm pullback gradient, 95% calcified distal left common femoral artery stenosis, 80% segmental mid right SFA stenosis with two-vessel runoff.  Distal abdominal aorta was stented with a 10 x 29 mm balloon expandable stent.  His claudication symptom subsequently resolved.  He underwent outpatient peripheral angiography in January 2020 revealing patent distal abdominal aortic stent without gradient, otherwise unchanged anatomy with 95% calcified left common femoral artery stenosis and bilateral moderate SFA disease.  He had a recurrent claudication symptom in 2022.  Doppler ultrasound showed patent distal aortic stent and iliac arteries with high-frequency signal in the distal left common femoral and distal right SFA.  He underwent peripheral angiography in February 2023 revealing 90% left renal artery stenosis, patent distal abdominal aortic stent and high-grade calcified left greater than right common femoral artery stenosis and focal bilateral mid SFA disease.  He was referred to Dr. Edilia Bo for consideration of  common femoral enterectomy.  Dr. Edilia Bo felt the patient was not a good candidate because of his age and comorbidities.  Carotid Doppler obtained in January 2024 demonstrated 1 to 39% disease using right ICA, 40 to 59% disease in the left ICA, antegrade vertebral artery flow with high resistance flow in the right vertebral artery.    He was previously admitted in October 2023 for AKI secondary to urinary retention.  Renal ultrasound showed mild right hydronephrosis.  He was treated with IV fluid and a Foley catheter.  Urology service recommended outpatient voiding trial.  He was discharged with a Foley.  Due to back issue, MRI of the lumbar spine was ordered which demonstrated nonacute compression fracture in the setting of hemangioma, L1 height loss.  He was referred to Dr. Johnsie Cancel of neurosurgery.  He returned in the hospital in December 2023 after mechanical fall that resulted in subcapital femoral neck fracture.  He was seen by Dr. Constance Goltz and underwent a right total hip surgery on 07/11/2022.  Hospital course complicated by AKI resolved with IV fluid.  I last saw the patient in May 2024 at which time he denied any anginal symptom or lower extremity claudication symptom.  He resides at [facility].  He goes down to cafeteria multiple times during the day without any exertional symptom.  His blood pressure typically runs around 150 at [facility].  Even in the cardiology office his blood pressure remain elevated.  He was on amlodipine, atenolol, hydralazine, hydrochlorothiazide and losartan.  Let Tylenol was previously increased to 100 mg daily by PCP.  I changed his losartan to irbesartan 150 mg daily for better blood pressure control.  Patient presents  today for follow-up.  Since the last visit, there has been multiple medication changes.  He has went back to losartan 50 mg twice a day.  He has been placed on clonidine 0.1 mg 3 times a day.  He is no longer on HCTZ, instead he is on  chlorthalidone 50 mg daily.  Hydralazine is currently at 75 mg twice daily dosing.  Blood pressure is very well-controlled at 128/64.  He denies any chest pain or shortness of breath.  However he is essentially wheelchair-bound at this point.  He says he will be receiving physical therapy where soon but hope to be able to walk at some point.  On physical exam, he has 2+ pitting edema and also crackles in his lung.  Wife suspect he has been having leg edema for several months now.  As for his crackles in the lung, I was not sure if he has atelectasis or this represent pulmonary edema.  However patient denies any shortness of breath, which makes me think this is more likely to be atelectasis.  I will obtain basic metabolic panel and also BNP.  Given the presence of leg edema, I also recommended a echocardiogram.  He is due for carotid ultrasound in the next few days.  I plan to bring the patient back in 3 to 4 months, earlier if leg edema worsens or echocardiograms become abnormal.   ROS: ***  Studies Reviewed: .        *** Risk Assessment/Calculations:   {Does this patient have ATRIAL FIBRILLATION?:[phone]}         Physical Exam:   VS:  BP 128/64 (BP Location: Left Arm, Patient Position: Sitting, Cuff Size: Normal)   Pulse (!) 49   Ht 5\' 4"  (1.626 m)   Wt 184 lb 6.4 oz (83.6 kg)   SpO2 97%   BMI 31.65 kg/m    Wt Readings from Last 3 Encounters:  08/24/23 184 lb 6.4 oz (83.6 kg)  07/16/23 181 lb 4.8 oz (82.2 kg)  07/13/23 181 lb 4.8 oz (82.2 kg)    GEN: Well nourished, well developed in no acute distress NECK: No JVD; No carotid bruits CARDIAC: ***RRR, no murmurs, rubs, gallops RESPIRATORY:  Clear to auscultation without rales, wheezing or rhonchi  ABDOMEN: Soft, non-tender, non-distended EXTREMITIES:  No edema; No deformity   ASSESSMENT AND PLAN: .   ***    {Are you ordering a CV Procedure (e.g. stress test, cath, DCCV, TEE, etc)?   Press F2        :[id number]}  Dispo:  ***  Signed, Azalee Course, PA

## 2023-08-24 NOTE — Patient Instructions (Signed)
Medication Instructions:  NO CHANGES *If you need a refill on your cardiac medications before your next appointment, please call your pharmacy*   Lab Work: BMP AND BNP TODAY If you have labs (blood work) drawn today and your tests are completely normal, you will receive your results only by: MyChart Message (if you have MyChart) OR A paper copy in the mail If you have any lab test that is abnormal or we need to change your treatment, we will call you to review the results.   Testing/Procedures:[address] Your physician has requested that you have an echocardiogram. Echocardiography is a painless test that uses sound waves to create images of your heart. It provides your doctor with information about the size and shape of your heart and how well your heart's chambers and valves are working. This procedure takes approximately one hour. There are no restrictions for this procedure. Please do NOT wear cologne, perfume, aftershave, or lotions (deodorant is allowed). Please arrive 15 minutes prior to your appointment time.  Please note: We ask at that you not bring children with you during ultrasound (echo/ vascular) testing. Due to room size and safety concerns, children are not allowed in the ultrasound rooms during exams. Our front office staff cannot provide observation of children in our lobby area while testing is being conducted. An adult accompanying a patient to their appointment will only be allowed in the ultrasound room at the discretion of the ultrasound technician under special circumstances. We apologize for any inconvenience.    Follow-Up: At [facility], you and your health needs are our priority.  As part of our continuing mission to provide you with exceptional heart care, we have created designated Provider Care Teams.  These Care Teams include your primary Cardiologist (physician) and Advanced Practice Providers (APPs -  Physician Assistants and Nurse  Practitioners) who all work together to provide you with the care you need, when you need it.  We recommend signing up for the patient portal called "MyChart".  Sign up information is provided on this After Visit Summary.  MyChart is used to connect with patients for Virtual Visits (Telemedicine).  Patients are able to view lab/test results, encounter notes, upcoming appointments, etc.  Non-urgent messages can be sent to your provider as well.   To learn more about what you can do with MyChart, go to [email or web address].    Your next appointment:   3-4 month(s)  Provider:   Nanetta Batty, MD or Azalee Course, PA     Other Instructions

## 2023-08-25 LAB — BASIC METABOLIC PANEL
BUN/Creatinine Ratio: 25 — ABNORMAL HIGH (ref 10–24)
BUN: 41 mg/dL — ABNORMAL HIGH (ref 10–36)
CO2: 18 mmol/L — ABNORMAL LOW (ref 20–29)
Calcium: 9.3 mg/dL (ref 8.6–10.2)
Chloride: 111 mmol/L — ABNORMAL HIGH (ref 96–106)
Creatinine, Ser: 1.61 mg/dL — ABNORMAL HIGH (ref 0.76–1.27)
Glucose: 97 mg/dL (ref 70–99)
Potassium: 4.9 mmol/L (ref 3.5–5.2)
Sodium: 146 mmol/L — ABNORMAL HIGH (ref 134–144)
eGFR: 40 mL/min/{1.73_m2} — ABNORMAL LOW (ref >59)

## 2023-08-25 LAB — BRAIN NATRIURETIC PEPTIDE: BNP: 266.9 pg/mL — ABNORMAL HIGH (ref 0.0–100.0)

## 2023-08-27 ENCOUNTER — Non-Acute Institutional Stay (SKILLED_NURSING_FACILITY): Payer: Medicare Other | Admitting: Adult Health

## 2023-08-27 ENCOUNTER — Telehealth: Payer: Self-pay | Admitting: Cardiovascular Disease

## 2023-08-27 ENCOUNTER — Encounter: Payer: Self-pay | Admitting: Adult Health

## 2023-08-27 DIAGNOSIS — K279 Peptic ulcer, site unspecified, unspecified as acute or chronic, without hemorrhage or perforation: Secondary | ICD-10-CM | POA: Diagnosis not present

## 2023-08-27 DIAGNOSIS — R531 Weakness: Secondary | ICD-10-CM | POA: Diagnosis not present

## 2023-08-27 DIAGNOSIS — G629 Polyneuropathy, unspecified: Secondary | ICD-10-CM | POA: Diagnosis not present

## 2023-08-27 DIAGNOSIS — N1832 Chronic kidney disease, stage 3b: Secondary | ICD-10-CM

## 2023-08-27 DIAGNOSIS — R600 Localized edema: Secondary | ICD-10-CM | POA: Diagnosis not present

## 2023-08-27 DIAGNOSIS — E782 Mixed hyperlipidemia: Secondary | ICD-10-CM | POA: Diagnosis not present

## 2023-08-27 DIAGNOSIS — H35371 Puckering of macula, right eye: Secondary | ICD-10-CM | POA: Diagnosis not present

## 2023-08-27 DIAGNOSIS — D509 Iron deficiency anemia, unspecified: Secondary | ICD-10-CM

## 2023-08-27 DIAGNOSIS — I10 Essential (primary) hypertension: Secondary | ICD-10-CM

## 2023-08-27 DIAGNOSIS — Z961 Presence of intraocular lens: Secondary | ICD-10-CM | POA: Diagnosis not present

## 2023-08-27 DIAGNOSIS — H5203 Hypermetropia, bilateral: Secondary | ICD-10-CM | POA: Diagnosis not present

## 2023-08-27 NOTE — Progress Notes (Unsigned)
Location:  [facility] Room Number: 149A Place of Service:  SNF [phone]) Provider:  Tamsen Roers, MD  Patient Care Team: Mahlon Gammon, MD as PCP - General (Internal Medicine) Runell Gess, MD as PCP - Cardiology (Cardiology)  Extended Emergency Contact Information Primary Emergency Contact: [name] Address: [address]          [place], [place] [address] [place] of [place] Home Phone: [phone] Mobile Phone: [phone] Relation: Spouse  Code Status:  DNR Goals of care: Advanced Directive information    08/27/2023   10:31 AM  Advanced Directives  Does Patient Have a Medical Advance Directive? Yes  Type of Estate agent of [place];Living will;Out of facility DNR (pink MOST or yellow form)  Does patient want to make changes to medical advance directive? No - Patient declined  Copy of Healthcare Power of Attorney in Chart? Yes - validated most recent copy scanned in chart (See row information)     Chief Complaint  Patient presents with   Medical Management of Chronic Issues    Patient is being seen for a routine visit     HPI:  Pt is a 89 y.o. male seen today for medical management of chronic diseases.     PMH of CAD, PAD followed with Dr Allyson Sabal, IDA, GERD, HTN, PVD and HLD He had a mechanical fall and subsequent severe right hip pain and inability to bear weight, and impacted subcapital femoral neck fracture. He underwent right THA by Dr. Charlann Boxer on 07/11/2022.  Has lumbar stenosis and hx of leg weakness with peripheral neuropathy Hx of AAA ICA stenosis and PAD, followed by Dr Allyson Sabal. Last carotid US showing bilateral ICA stenosis with progression. Repeat ordered this month 08/2023  Renal artery stenosis left 90%. Also has femoral artery stenosis.  Currently denies claudication but has limited ambulation and poor memory  PAD: ABI 09/29/21 Right: Resting right  ankle-brachial index indicates mild right lower  extremity arterial disease. The right toe-brachial index is abnormal.   Left: Resting left ankle-brachial index indicates moderate left lower  extremity arterial disease. The left toe-brachial index is abnormal.   He walks with a walker short distances and uses a scooter for long distances. Needs assistance with bathing, dressing. Able to feed himself. Very pleasant. Incontinent intermittently.    He has a tremor with intention, seems slightly worse over time. He denies that it is affecting his QOL or ADls.   He has a hx of urinary retention  Hx of hydronephrosis and stent. No current issues.   Memory loss: MMSE 26/30 11/2022  Chronic cough on hycodan, hx of cough due to gerd. No lung disease noted per note by pulmonary 2019. ON PPI.  He was seen by Cardiology 08/24/23 and was noted to have some edema and placed on lasix. BNP 266.9. BUN 41 Cr 1.61 NA 146 K 4.9 No significant weight gain Wt Readings from Last 3 Encounters:  08/27/23 182 lb 12.8 oz (82.9 kg)  08/24/23 184 lb 6.4 oz (83.6 kg)  07/16/23 181 lb 4.8 oz (82.2 kg)    Staff report he is leaning over to the right due to weakness in his power chair. No focal weakness.  Past Medical History:  Diagnosis Date   AAA (abdominal aortic aneurysm) (HCC)    asymptomatic   AAA (abdominal aortic aneurysm) (HCC) 2010   peripheral  angiogram-- bilateral SFA DISEASE  and 60 to 70% infrarenaal abd. aortic stenosis  with 15 -mm gradient   Adenomatous colon polyp 11/2003   CAD (coronary artery disease)    Gait abnormality 02/13/2017   Gastroparesis    pt denies   GERD (gastroesophageal reflux disease)    w/ LPR   History of kidney stones    x2   Hyperlipidemia    Hypertension    IDA (iron deficiency anemia)    Peripheral arterial disease (HCC)    RCEA  by Dr [name]   PUD (peptic ulcer disease)    pt unaware   Vertigo    Past Surgical History:  Procedure Laterality Date    ABDOMINAL AORTOGRAM W/LOWER EXTREMITY Bilateral 08/05/2018   Procedure: ABDOMINAL AORTOGRAM W/LOWER EXTREMITY;  Surgeon: Runell Gess, MD;  Location: [facility] INVASIVE CV LAB;  Service: Cardiovascular;  Laterality: Bilateral;   ABDOMINAL AORTOGRAM W/LOWER EXTREMITY N/A 09/26/2021   Procedure: ABDOMINAL AORTOGRAM W/LOWER EXTREMITY;  Surgeon: Runell Gess, MD;  Location: [facility] INVASIVE CV LAB;  Service: Cardiovascular;  Laterality: N/A;   AORTOGRAM  04/24/2016    Abdominal aortogram, bilateral iliac angiogram, bifemoral runoff   CARDIAC CATHETERIZATION  05/12/2005   RCA   carotid doppler  01/24/2013   RICA endarterectomy,left CCA 0-49%; left bulb and prox ICA 50-69%; bilaateral subclavian < 50%   CAROTID ENDARTERECTOMY  11/01/2010   CATARACT EXTRACTION     COLONOSCOPY     CORONARY ANGIOPLASTY  05/18/2005   2 STENTS distal RCA AND PROXIMAL-MID RCA   5 total stents per pt.   CYSTOSCOPY/URETEROSCOPY/HOLMIUM LASER/STENT PLACEMENT Left 01/11/2018   Procedure: LEFT URETEROSCOPY/HOLMIUM LASER/STENT PLACEMENT;  Surgeon: Crist Fat, MD;  Location: [facility];  Service: Urology;  Laterality: Left;   DOPPLER ECHOCARDIOGRAPHY  02/07/2012   EF 55%,SHOWED NO ISCHEMIA    HERNIA REPAIR     umbilical   lower arterial  doppler  02/04/2013   aotra 1.5 x 1.5 cm; distal abd aorta 70-99%,proximal common iliac arteries -very stenotic with increased velocities>50%,may be falsely elevated as a result of residual plaque from the distal aorta stenosis   lower extremity doppler  June 18 ,2013   ABI'S ABNORMAL, RABI was 0.88 and LABI 0.75 ,with 3-vessel  run off   NM MYOVIEW LTD  MAY 23,2011   showed no significant ischemia;   NM MYOVIEW LTD  04/22/2008   ef 77%,exercise capcity 6 METS ,exaggerated blood pressure response to exercise   PERIPHERAL VASCULAR CATHETERIZATION N/A 04/24/2016   Procedure: Lower Extremity Angiography;  Surgeon: Runell Gess, MD;  Location: [facility] INVASIVE CV LAB;  Service: Cardiovascular;   Laterality: N/A;   PERIPHERAL VASCULAR CATHETERIZATION  04/24/2016   Procedure: Peripheral Vascular Intervention;  Surgeon: Runell Gess, MD;  Location: [facility] INVASIVE CV LAB;  Service: Cardiovascular;;  Aorta   retrograde central aortic catheterization  05/19/2005   cutting balloon atherectomy, c-circ stenosis with DES STENTING CYPHER   TONSILLECTOMY     TOTAL HIP ARTHROPLASTY Right 07/11/2022   Procedure: TOTAL HIP ARTHROPLASTY ANTERIOR APPROACH;  Surgeon: Durene Romans, MD;  Location: [facility];  Service: Orthopedics;  Laterality: Right;   TOTAL KNEE ARTHROPLASTY Left 01/03/2019   Procedure: TOTAL KNEE ARTHROPLASTY;  Surgeon: Beverely Low, MD;  Location: [facility];  Service: Orthopedics;  Laterality: Left;  with IS block   TRANSURETHRAL RESECTION OF PROSTATE N/A 09/08/2016   Procedure: TRANSURETHRAL RESECTION OF THE PROSTATE (TURP);  Surgeon: Crist Fat, MD;  Location: [facility];  Service: Urology;  Laterality: N/A;   TRANSURETHRAL RESECTION OF PROSTATE     10-10-17  Dr. Marlou Porch   TRANSURETHRAL RESECTION OF PROSTATE N/A 10/10/2017   Procedure: TRANSURETHRAL RESECTION OF THE PROSTATE (TURP);  Surgeon: Crist Fat, MD;  Location: [facility];  Service: Urology;  Laterality: N/A;   VASECTOMY      Allergies  Allergen Reactions   Lisinopril Cough   Niaspan [Niacin Er (Antihyperlipidemic)] Rash    Outpatient Encounter Medications as of 08/27/2023  Medication Sig   acetaminophen (TYLENOL) 500 MG tablet Take 1 tablet (500 mg total) by mouth 2 (two) times daily as needed.   amLODipine (NORVASC) 10 MG tablet Take 10 mg by mouth daily.   Apoaequorin (PREVAGEN PO) Take 1 tablet by mouth daily.   atorvastatin (LIPITOR) 40 MG tablet Take 40 mg by mouth daily at 6 PM.    Biotin 10 MG TABS Take 10 mg by mouth daily in the afternoon.   cetirizine (ZYRTEC) 10 MG tablet Take 1 tablet (10 mg total) by mouth at bedtime.   chlorthalidone (HYGROTON) 25 MG tablet Take 2 tablets (50 mg total) by mouth daily.  25 mg daily x 1 week then increase to 50 mg daily.   cholecalciferol (VITAMIN D3) 25 MCG (1000 UT) tablet Take 1,000 Units by mouth daily.   cloNIDine (CATAPRES) 0.1 MG tablet Take 1 tablet (0.1 mg total) by mouth 3 (three) times daily.   clopidogrel (PLAVIX) 75 MG tablet Take 75 mg by mouth every evening.    finasteride (PROSCAR) 5 MG tablet Take 5 mg by mouth daily.   gabapentin (NEURONTIN) 100 MG capsule Take 100 mg by mouth 3 (three) times daily.   Glucosamine-Chondroitin 750-600 MG TABS Take 1 tablet by mouth 2 (two) times daily.   hydrALAZINE (APRESOLINE) 25 MG tablet Take 25 mg by mouth 3 (three) times daily.   hydrALAZINE (APRESOLINE) 50 MG tablet Take by mouth. 75 mg AM and HS 50 mg in afternood   HYDROcodone bit-homatropine (HYCODAN) 5-1.5 MG/5ML syrup Take 5 mLs by mouth at bedtime as needed for cough.   iron polysaccharides (NIFEREX) 150 MG capsule Take 150 mg by mouth every Monday, Wednesday, and Friday.   losartan (COZAAR) 50 MG tablet Take 50 mg by mouth 2 (two) times daily.   melatonin 5 MG TABS Take 1 tablet (5 mg total) by mouth at bedtime.   metoprolol tartrate (LOPRESSOR) 50 MG tablet Take 75 mg by mouth 2 (two) times daily.   Multiple Vitamin (MULTIVITAMIN WITH MINERALS) TABS tablet Take 1 tablet by mouth daily.   ondansetron (ZOFRAN) 4 MG tablet Take 4 mg by mouth every 6 (six) hours as needed for nausea or vomiting.   pantoprazole (PROTONIX) 40 MG tablet Take 40 mg by mouth 2 (two) times daily.   potassium chloride SA (K-DUR,KLOR-CON) 20 MEQ tablet Take 20 mEq by mouth every evening.    tamsulosin (FLOMAX) 0.4 MG CAPS capsule Take 1 capsule (0.4 mg total) by mouth daily after supper.   hydrocortisone ointment 0.5 % Apply 1 Application topically 2 (two) times daily. nystatin with hydrocortisone 0.5% 1:1 ointment; 0.5%; amt: Topically to area; topical rash is healed (Patient not taking: Reported on 08/27/2023)   NYAMYC powder Apply 1 Application topically daily. (Patient not  taking: Reported on 08/27/2023)   No facility-administered encounter medications on file as of 08/27/2023.    Review of Systems  Constitutional:  Negative for activity change, appetite change, chills, diaphoresis, fatigue, fever and unexpected weight change.  Respiratory:  Negative for cough, shortness of breath, wheezing and stridor.   Cardiovascular:  Positive for  leg swelling. Negative for chest pain and palpitations.  Gastrointestinal:  Negative for abdominal distention, abdominal pain, constipation and diarrhea.  Genitourinary:  Negative for difficulty urinating and dysuria.  Musculoskeletal:  Positive for gait problem. Negative for arthralgias, back pain, joint swelling and myalgias.  Neurological:  Positive for weakness. Negative for dizziness, seizures, syncope, facial asymmetry, speech difficulty and headaches.  Hematological:  Negative for adenopathy. Does not bruise/bleed easily.  Psychiatric/Behavioral:  Negative for agitation, behavioral problems and confusion.     Immunization History  Administered Date(s) Administered   Fluad Quad(high Dose 65+) 05/02/2022, 05/22/2023   Influenza, High Dose Seasonal PF 03/31/2017, 03/29/2018   Influenza,inj,Quad PF,6+ Mos 04/25/2016, 05/06/2018   Influenza,inj,quad, With Preservative 05/26/2019   Influenza-Unspecified 05/14/2001, 05/31/2001, 05/14/2002, 05/15/2002, 05/18/2003, 05/25/2004, 04/30/2005, 05/29/2006, 05/01/2007, 05/11/2008, 05/31/2009, 05/31/2010, 04/30/2013, 05/31/2014, 03/31/2021   Moderna Covid-19 Vaccine Bivalent Booster 24yrs & up 08/31/2021, 05/22/2023   Moderna SARS-COV2 Booster Vaccination 06/15/2020, 11/19/2020, 01/02/2022   Moderna Sars-Covid-2 Vaccination 08/12/2019, 09/10/2019, 05/31/2022   Pfizer Covid-19 Vaccine Bivalent Booster 28yrs & up 11/23/2022   Pneumococcal Polysaccharide-23 05/02/2000, 09/24/2018   Rsv, Bivalent, Protein Subunit Rsvpref,pf Verdis Frederickson) 07/19/2022   Td 09/08/2013   Zoster  Recombinant(Shingrix) 10/04/2017, 01/23/2018   Pertinent  Health Maintenance Due  Topic Date Due   INFLUENZA VACCINE  Completed      07/13/2022    7:10 AM 07/28/2022    9:00 AM 10/14/2022    7:21 AM 11/28/2022    2:50 PM 06/04/2023    4:59 PM  Fall Risk  Falls in the past year?   1 1 0  Was there an injury with Fall?   1 0 0  Fall Risk Category Calculator   3 1 0  (RETIRED) Patient Fall Risk Level High fall risk High fall risk     Patient at Risk for Falls Due to    History of fall(s);Impaired balance/gait;Impaired mobility No Fall Risks  Fall risk Follow up   Falls evaluation completed Falls evaluation completed;Education provided;Falls prevention discussed Falls evaluation completed   Functional Status Survey:    Vitals:   08/27/23 0958  BP: (!) 141/54  Pulse: 60  Resp: 14  Temp: (!) 97.2 F (36.2 C)  TempSrc: Temporal  SpO2: 93%  Weight: 182 lb 12.8 oz (82.9 kg)  Height: 5\' 4"  (1.626 m)   Body mass index is 31.38 kg/m. Physical Exam Constitutional:      General: He is not in acute distress.    Appearance: He is not diaphoretic.  HENT:     Head: Normocephalic and atraumatic.  Eyes:     Extraocular Movements: Extraocular movements intact.     Conjunctiva/sclera: Conjunctivae normal.     Pupils: Pupils are equal, round, and reactive to light.  Neck:     Thyroid: No thyromegaly.     Vascular: No JVD.     Trachea: No tracheal deviation.  Cardiovascular:     Rate and Rhythm: Normal rate and regular rhythm.     Heart sounds: No murmur heard. Pulmonary:     Effort: Pulmonary effort is normal. No respiratory distress.     Breath sounds: Normal breath sounds. No wheezing.  Abdominal:     General: Bowel sounds are normal. There is no distension.     Palpations: Abdomen is soft.     Tenderness: There is no abdominal tenderness.  Musculoskeletal:     Right lower leg: Edema (+1) present.     Left lower leg: Edema (+1) present.  Lymphadenopathy:  Cervical: No  cervical adenopathy.  Skin:    General: Skin is warm and dry.  Neurological:     General: No focal deficit present.     Mental Status: He is alert and oriented to person, place, and time.     Cranial Nerves: No cranial nerve deficit.  Psychiatric:        Mood and Affect: Mood normal.     Labs reviewed: Recent Labs    01/09/23 0000 03/01/23 1450 06/18/23 0000 08/24/23 1236  NA 144 142 144 146*  K 4.1 4.2 4.3 4.9  CL 109* 107 110* 111*  CO2 22  --  23* 18*  GLUCOSE  --  109*  --  97  BUN 25* 20 29* 41*  CREATININE 1.1 1.10 1.3 1.61*  CALCIUM 9.1  --  9.4 9.3   Recent Labs    10/17/22 0000 06/18/23 0000  AST 16 16  ALT 15 15  ALKPHOS 111 91  ALBUMIN 4.1 3.9   Recent Labs    10/17/22 0000 03/01/23 1450 06/18/23 0000  WBC 7.6  --  6.8  HGB 12.6* 12.6* 11.6*  HCT 36* 37.0* 34*  PLT 217  --  188   Lab Results  Component Value Date   TSH 6.02 (A) 06/18/2023   Lab Results  Component Value Date   HGBA1C 5.5 11/23/2016   Lab Results  Component Value Date   CHOL 133 10/17/2022   HDL 49 10/17/2022   LDLCALC 65 10/17/2022   TRIG 122 10/17/2022   CHOLHDL 2.4 11/23/2016    Significant Diagnostic Results in last 30 days:  No results found.  Assessment/Plan  1. Essential hypertension (Primary) Improved May consider changing from hygroton to lasix await cardiology input. Echo ordered.   2. Mixed hyperlipidemia Lab Results  Component Value Date   LDLCALC 65 10/17/2022  Continue crestor    3. Peptic ulcer With chronic cough Continue Protonix   4. Neuropathy Continue gabapentin 100 mg tid   5. Iron deficiency anemia, unspecified iron deficiency anemia type Lab Results  Component Value Date   HGB 11.6 (A) 06/18/2023   Continue iron   6. Localized edema Staring lasix per cardiology  7. Weakness OT eval already ordered for leaning in PWC  8. CKD 3b Worsening on Hygroton  See #1  Family/ staff Communication: nurse  Labs/tests ordered:   BMP BNP one month per cardiology

## 2023-08-27 NOTE — Telephone Encounter (Signed)
Spoke with Misty Stanley at [employer]. She wanted to confirm PA plan (per lab results) that were posted on patient's MyChart. Confirmed/reviewed with her.   She did not need Rx or lab orders -- she took a verbal order based on lab results. She will send labs in 1 month to our office for review.

## 2023-08-27 NOTE — Telephone Encounter (Signed)
Pt c/o medication issue:  1. Name of Medication:   Furosemide  2. How are you currently taking this medication (dosage and times per day)?   3. Are you having a reaction (difficulty breathing--STAT)?   4. What is your medication issue?   Caller Misty Stanley) wants to confirm patient had Furosemide added to his medication list and has orders for a follow-up blood test.

## 2023-08-29 ENCOUNTER — Ambulatory Visit (HOSPITAL_COMMUNITY)
Admission: RE | Admit: 2023-08-29 | Discharge: 2023-08-29 | Disposition: A | Discharge status: Home / Self Care | Payer: Medicare Other | Source: Ambulatory Visit | Attending: Cardiovascular Disease | Admitting: Cardiovascular Disease

## 2023-08-29 DIAGNOSIS — I6523 Occlusion and stenosis of bilateral carotid arteries: Secondary | ICD-10-CM | POA: Insufficient documentation

## 2023-08-29 DIAGNOSIS — R293 Abnormal posture: Secondary | ICD-10-CM | POA: Diagnosis not present

## 2023-08-29 DIAGNOSIS — Z741 Need for assistance with personal care: Secondary | ICD-10-CM | POA: Diagnosis not present

## 2023-08-29 DIAGNOSIS — M1712 Unilateral primary osteoarthritis, left knee: Secondary | ICD-10-CM | POA: Diagnosis not present

## 2023-08-30 ENCOUNTER — Encounter: Payer: Self-pay | Admitting: Adult Health

## 2023-09-03 DIAGNOSIS — R293 Abnormal posture: Secondary | ICD-10-CM | POA: Diagnosis not present

## 2023-09-03 DIAGNOSIS — M1712 Unilateral primary osteoarthritis, left knee: Secondary | ICD-10-CM | POA: Diagnosis not present

## 2023-09-03 DIAGNOSIS — Z741 Need for assistance with personal care: Secondary | ICD-10-CM | POA: Diagnosis not present

## 2023-09-04 ENCOUNTER — Telehealth: Payer: Self-pay | Admitting: Cardiovascular Disease

## 2023-09-04 ENCOUNTER — Telehealth: Payer: Self-pay

## 2023-09-04 DIAGNOSIS — Z79899 Other long term (current) drug therapy: Secondary | ICD-10-CM

## 2023-09-04 DIAGNOSIS — R293 Abnormal posture: Secondary | ICD-10-CM | POA: Diagnosis not present

## 2023-09-04 DIAGNOSIS — Z741 Need for assistance with personal care: Secondary | ICD-10-CM | POA: Diagnosis not present

## 2023-09-04 DIAGNOSIS — B351 Tinea unguium: Secondary | ICD-10-CM | POA: Diagnosis not present

## 2023-09-04 DIAGNOSIS — M79671 Pain in right foot: Secondary | ICD-10-CM | POA: Diagnosis not present

## 2023-09-04 DIAGNOSIS — M1712 Unilateral primary osteoarthritis, left knee: Secondary | ICD-10-CM | POA: Diagnosis not present

## 2023-09-04 DIAGNOSIS — M79672 Pain in left foot: Secondary | ICD-10-CM | POA: Diagnosis not present

## 2023-09-04 NOTE — Telephone Encounter (Addendum)
 Called patient regarding results spoke with wife instead says husband is now in a skilled facility, wife did verbalize understanding of results but will have someone from nursing home call office back to go over results  ----- Message from Scot Ford sent at 08/26/2023  5:44 PM EST ----- Kidney function mildly elevated, BNP borderline elevated suggestive of mild volume overload. Pending echocardiogram as recommended. Recommend lasix  20mg  daily for 2 days, then change to take lasix  as needed after that for worsening leg edema. Repeat BMET and BNP in 1 month.

## 2023-09-04 NOTE — Telephone Encounter (Signed)
Misty Stanley from [employer] on pts behalf to further discuss results per wife's request due to her not understanding

## 2023-09-05 DIAGNOSIS — R293 Abnormal posture: Secondary | ICD-10-CM | POA: Diagnosis not present

## 2023-09-05 DIAGNOSIS — M1712 Unilateral primary osteoarthritis, left knee: Secondary | ICD-10-CM | POA: Diagnosis not present

## 2023-09-05 DIAGNOSIS — Z741 Need for assistance with personal care: Secondary | ICD-10-CM | POA: Diagnosis not present

## 2023-09-05 NOTE — Telephone Encounter (Signed)
 I have called and spoke with Nathan Santiago, all questions answered. Thank you

## 2023-09-06 DIAGNOSIS — M1712 Unilateral primary osteoarthritis, left knee: Secondary | ICD-10-CM | POA: Diagnosis not present

## 2023-09-06 DIAGNOSIS — R293 Abnormal posture: Secondary | ICD-10-CM | POA: Diagnosis not present

## 2023-09-06 DIAGNOSIS — Z741 Need for assistance with personal care: Secondary | ICD-10-CM | POA: Diagnosis not present

## 2023-09-07 ENCOUNTER — Other Ambulatory Visit (HOSPITAL_COMMUNITY): Payer: Self-pay

## 2023-09-07 DIAGNOSIS — R293 Abnormal posture: Secondary | ICD-10-CM | POA: Diagnosis not present

## 2023-09-07 DIAGNOSIS — M1712 Unilateral primary osteoarthritis, left knee: Secondary | ICD-10-CM | POA: Diagnosis not present

## 2023-09-07 DIAGNOSIS — Z741 Need for assistance with personal care: Secondary | ICD-10-CM | POA: Diagnosis not present

## 2023-09-07 DIAGNOSIS — I6523 Occlusion and stenosis of bilateral carotid arteries: Secondary | ICD-10-CM

## 2023-09-10 DIAGNOSIS — Z741 Need for assistance with personal care: Secondary | ICD-10-CM | POA: Diagnosis not present

## 2023-09-10 DIAGNOSIS — M1712 Unilateral primary osteoarthritis, left knee: Secondary | ICD-10-CM | POA: Diagnosis not present

## 2023-09-10 DIAGNOSIS — R293 Abnormal posture: Secondary | ICD-10-CM | POA: Diagnosis not present

## 2023-09-11 DIAGNOSIS — R293 Abnormal posture: Secondary | ICD-10-CM | POA: Diagnosis not present

## 2023-09-11 DIAGNOSIS — Z741 Need for assistance with personal care: Secondary | ICD-10-CM | POA: Diagnosis not present

## 2023-09-11 DIAGNOSIS — M1712 Unilateral primary osteoarthritis, left knee: Secondary | ICD-10-CM | POA: Diagnosis not present

## 2023-09-13 ENCOUNTER — Encounter: Payer: Self-pay | Admitting: Adult Health

## 2023-09-13 ENCOUNTER — Non-Acute Institutional Stay (SKILLED_NURSING_FACILITY): Payer: Medicare Other | Admitting: Adult Health

## 2023-09-13 DIAGNOSIS — T148XXA Other injury of unspecified body region, initial encounter: Secondary | ICD-10-CM

## 2023-09-13 DIAGNOSIS — L089 Local infection of the skin and subcutaneous tissue, unspecified: Secondary | ICD-10-CM | POA: Diagnosis not present

## 2023-09-13 MED ORDER — CEPHALEXIN 500 MG PO CAPS: 500.0000 mg | ORAL_CAPSULE | Freq: Three times a day (TID) | ORAL | Status: AC

## 2023-09-13 NOTE — Progress Notes (Signed)
Location:  Medical illustrator of Service:  SNF (31) Provider:   Peggye Ley, ANP [facility] [phone]   Mahlon Gammon, MD  Patient Care Team: Mahlon Gammon, MD as PCP - General (Internal Medicine) Runell Gess, MD as PCP - Cardiology (Cardiology)  Extended Emergency Contact Information Primary Emergency Contact: [name] Address: [address]          [place], [place] [address] D[place] of [place] Home Phone: [phone] Mobile Phone: [phone] Relation: Spouse  Code Status:  DNR Goals of care: Advanced Directive information    08/27/2023   10:31 AM  Advanced Directives  Does Patient Have a Medical Advance Directive? Yes  Type of Estate agent of [place];Living will;Out of facility DNR (pink MOST or yellow form)  Does patient want to make changes to medical advance directive? No - Patient declined  Copy of Healthcare Power of Attorney in Chart? Yes - validated most recent copy scanned in chart (See row information)     Chief Complaint  Patient presents with   Acute Visit    Skin tear    HPI:  Pt is a 89 y.o. 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  The staff cleansed the area and applied steristrips and a polymem dressing. The is now warm, red, and tender. He is not having a fever, or streaking redness.   Also he has a small pustule to his right groin.     Past Medical History:  Diagnosis Date   AAA (abdominal aortic aneurysm) (HCC)    asymptomatic   AAA (abdominal aortic aneurysm) (HCC) 2010   peripheral  angiogram-- bilateral SFA DISEASE  and 60 to 70% infrarenaal abd. aortic stenosis with 15 -mm gradient   Adenomatous colon polyp 11/2003   CAD (coronary artery disease)    Gait abnormality 02/13/2017   Gastroparesis    pt denies   GERD (gastroesophageal  reflux disease)    w/ LPR   History of kidney stones    x2   Hyperlipidemia    Hypertension    IDA (iron deficiency anemia)    Peripheral arterial disease (HCC)    RCEA  by Dr [name]   PUD (peptic ulcer disease)    pt unaware   Vertigo    Past Surgical History:  Procedure Laterality Date   ABDOMINAL AORTOGRAM W/LOWER EXTREMITY Bilateral 08/05/2018   Procedure: ABDOMINAL AORTOGRAM W/LOWER EXTREMITY;  Surgeon: Runell Gess, MD;  Location: [facility] INVASIVE CV LAB;  Service: Cardiovascular;  Laterality: Bilateral;   ABDOMINAL AORTOGRAM W/LOWER EXTREMITY N/A 09/26/2021   Procedure: ABDOMINAL AORTOGRAM W/LOWER EXTREMITY;  Surgeon: Runell Gess, MD;  Location: [facility] INVASIVE CV LAB;  Service: Cardiovascular;  Laterality: N/A;   AORTOGRAM  04/24/2016    Abdominal aortogram, bilateral iliac angiogram, bifemoral runoff   CARDIAC CATHETERIZATION  05/12/2005   RCA   carotid doppler  01/24/2013   RICA endarterectomy,left CCA 0-49%; left bulb and prox ICA 50-69%; bilaateral subclavian < 50%   CAROTID ENDARTERECTOMY  11/01/2010   CATARACT EXTRACTION     COLONOSCOPY     CORONARY ANGIOPLASTY  05/18/2005   2 STENTS distal RCA AND PROXIMAL-MID RCA   5 total stents per pt.   CYSTOSCOPY/URETEROSCOPY/HOLMIUM LASER/STENT PLACEMENT Left 01/11/2018   Procedure: LEFT URETEROSCOPY/HOLMIUM LASER/STENT PLACEMENT;  Surgeon: Crist Fat, MD;  Location: [facility];  Service: Urology;  Laterality: Left;   DOPPLER ECHOCARDIOGRAPHY  02/07/2012   EF 55%,SHOWED NO ISCHEMIA    HERNIA REPAIR     umbilical   lower arterial  doppler  02/04/2013   aotra 1.5 x 1.5 cm; distal abd aorta 70-99%,proximal common iliac arteries -very stenotic with increased velocities>50%,may be falsely elevated as a result of residual plaque from the distal aorta stenosis   lower extremity doppler  June 18 ,2013   ABI'S ABNORMAL, RABI was 0.88 and LABI 0.75 ,with 3-vessel  run off   NM MYOVIEW LTD  MAY 23,2011   showed no  significant ischemia;   NM MYOVIEW LTD  04/22/2008   ef 77%,exercise capcity 6 METS ,exaggerated blood pressure response to exercise   PERIPHERAL VASCULAR CATHETERIZATION N/A 04/24/2016   Procedure: Lower Extremity Angiography;  Surgeon: Runell Gess, MD;  Location: [facility] INVASIVE CV LAB;  Service: Cardiovascular;  Laterality: N/A;   PERIPHERAL VASCULAR CATHETERIZATION  04/24/2016   Procedure: Peripheral Vascular Intervention;  Surgeon: Runell Gess, MD;  Location: [facility] INVASIVE CV LAB;  Service: Cardiovascular;;  Aorta   retrograde central aortic catheterization  05/19/2005   cutting balloon atherectomy, c-circ stenosis with DES STENTING CYPHER   TONSILLECTOMY     TOTAL HIP ARTHROPLASTY Right 07/11/2022   Procedure: TOTAL HIP ARTHROPLASTY ANTERIOR APPROACH;  Surgeon: Durene Romans, MD;  Location: [facility];  Service: Orthopedics;  Laterality: Right;   TOTAL KNEE ARTHROPLASTY Left 01/03/2019   Procedure: TOTAL KNEE ARTHROPLASTY;  Surgeon: Beverely Low, MD;  Location: [facility];  Service: Orthopedics;  Laterality: Left;  with IS block   TRANSURETHRAL RESECTION OF PROSTATE N/A 09/08/2016   Procedure: TRANSURETHRAL RESECTION OF THE PROSTATE (TURP);  Surgeon: Crist Fat, MD;  Location: [facility];  Service: Urology;  Laterality: N/A;   TRANSURETHRAL RESECTION OF PROSTATE     10-10-17  Dr. Marlou Porch   TRANSURETHRAL RESECTION OF PROSTATE N/A 10/10/2017   Procedure: TRANSURETHRAL RESECTION OF THE PROSTATE (TURP);  Surgeon: Crist Fat, MD;  Location: [facility];  Service: Urology;  Laterality: N/A;   VASECTOMY      Allergies  Allergen Reactions   Lisinopril Cough   Niaspan [Niacin Er (Antihyperlipidemic)] Rash    Outpatient Encounter Medications as of 09/13/2023  Medication Sig   cephALEXin (KEFLEX) 500 MG capsule Take 1 capsule (500 mg total) by mouth 3 (three) times daily for 7 days.   acetaminophen (TYLENOL) 500 MG tablet Take 1 tablet (500 mg total) by mouth 2 (two) times daily as needed.    amLODipine (NORVASC) 10 MG tablet Take 10 mg by mouth daily.   Apoaequorin (PREVAGEN PO) Take 1 tablet by mouth daily.   atorvastatin (LIPITOR) 40 MG tablet Take 40 mg by mouth daily at 6 PM.    Biotin 10 MG TABS Take 10 mg by mouth daily in the afternoon.   cetirizine (ZYRTEC) 10 MG tablet Take 1 tablet (10 mg total) by mouth at bedtime.   chlorthalidone (HYGROTON) 25 MG tablet Take 2 tablets (50 mg total) by mouth daily. 25 mg daily x 1 week then increase to 50 mg daily.   cholecalciferol (VITAMIN D3) 25 MCG (1000 UT) tablet Take 1,000 Units by mouth daily.   cloNIDine (CATAPRES) 0.1 MG tablet Take 1 tablet (0.1 mg total) by mouth 3 (three) times daily.   clopidogrel (PLAVIX) 75 MG tablet Take 75 mg by mouth every evening.    finasteride (PROSCAR) 5 MG tablet Take 5 mg by mouth  daily.   furosemide (LASIX) 20 MG tablet Take 20 mg by mouth. Take daily in AM for 2 days. Then take daily AS NEEDED for leg swelling.   gabapentin (NEURONTIN) 100 MG capsule Take 100 mg by mouth 3 (three) times daily.   Glucosamine-Chondroitin 750-600 MG TABS Take 1 tablet by mouth 2 (two) times daily.   hydrALAZINE (APRESOLINE) 25 MG tablet Take 25 mg by mouth 3 (three) times daily as needed.   hydrALAZINE (APRESOLINE) 50 MG tablet Take by mouth. 75 mg AM and HS 50 mg in afternoon   HYDROcodone bit-homatropine (HYCODAN) 5-1.5 MG/5ML syrup Take 5 mLs by mouth at bedtime as needed for cough.   iron polysaccharides (NIFEREX) 150 MG capsule Take 150 mg by mouth every Monday, Wednesday, and Friday.   losartan (COZAAR) 50 MG tablet Take 50 mg by mouth 2 (two) times daily.   melatonin 5 MG TABS Take 1 tablet (5 mg total) by mouth at bedtime.   metoprolol tartrate (LOPRESSOR) 50 MG tablet Take 75 mg by mouth 2 (two) times daily.   Multiple Vitamin (MULTIVITAMIN WITH MINERALS) TABS tablet Take 1 tablet by mouth daily.   ondansetron (ZOFRAN) 4 MG tablet Take 4 mg by mouth every 6 (six) hours as needed for nausea or vomiting.    pantoprazole (PROTONIX) 40 MG tablet Take 40 mg by mouth 2 (two) times daily.   potassium chloride SA (K-DUR,KLOR-CON) 20 MEQ tablet Take 20 mEq by mouth every evening.    tamsulosin (FLOMAX) 0.4 MG CAPS capsule Take 1 capsule (0.4 mg total) by mouth daily after supper.   No facility-administered encounter medications on file as of 09/13/2023.    Review of Systems  Immunization History  Administered Date(s) Administered   Fluad Quad(high Dose 65+) 05/02/2022, 05/22/2023   Influenza, High Dose Seasonal PF 03/31/2017, 03/29/2018   Influenza,inj,Quad PF,6+ Mos 04/25/2016, 05/06/2018   Influenza,inj,quad, With Preservative 05/26/2019   Influenza-Unspecified 05/14/2001, 05/31/2001, 05/14/2002, 05/15/2002, 05/18/2003, 05/25/2004, 04/30/2005, 05/29/2006, 05/01/2007, 05/11/2008, 05/31/2009, 05/31/2010, 04/30/2013, 05/31/2014, 03/31/2021   Moderna Covid-19 Vaccine Bivalent Booster 44yrs & up 08/31/2021, 05/22/2023   Moderna SARS-COV2 Booster Vaccination 06/15/2020, 11/19/2020, 01/02/2022   Moderna Sars-Covid-2 Vaccination 08/12/2019, 09/10/2019, 05/31/2022   Pfizer Covid-19 Vaccine Bivalent Booster 31yrs & up 11/23/2022   Pneumococcal Polysaccharide-23 05/02/2000, 09/24/2018   Rsv, Bivalent, Protein Subunit Rsvpref,pf Verdis Frederickson) 07/19/2022   Td 09/08/2013   Zoster Recombinant(Shingrix) 10/04/2017, 01/23/2018   Pertinent  Health Maintenance Due  Topic Date Due   INFLUENZA VACCINE  Completed      07/13/2022    7:10 AM 07/28/2022    9:00 AM 10/14/2022    7:21 AM 11/28/2022    2:50 PM 06/04/2023    4:59 PM  Fall Risk  Falls in the past year?   1 1 0  Was there an injury with Fall?   1 0 0  Fall Risk Category Calculator   3 1 0  (RETIRED) Patient Fall Risk Level High fall risk High fall risk     Patient at Risk for Falls Due to    History of fall(s);Impaired balance/gait;Impaired mobility No Fall Risks  Fall risk Follow up   Falls evaluation completed Falls evaluation completed;Education  provided;Falls prevention discussed Falls evaluation completed   Functional Status Survey:    Vitals:   09/13/23 1544  BP: (!) 153/81  Pulse: 65  Resp: 14  Temp: 98.1 F (36.7 C)  SpO2: 95%   There is no height or weight on file to calculate BMI. Physical Exam Vitals and  nursing note reviewed.  Constitutional:      General: He is not in acute distress.    Appearance: Normal appearance. He is not ill-appearing.  Musculoskeletal:     Right lower leg: Edema (+1 pitting) present.     Left lower leg: Edema (1+ pitting) present.  Skin:    General: Skin is warm and dry.     Comments: Right groin area with small pustule and surrounding erythema.  Right lower ext with large vertical skin tear to RLE.  Surrounding erythema to the site. Mildly tender. No red streaking. No purulent matter. Bloody drainage noted.   Neurological:     General: No focal deficit present.     Mental Status: He is alert. Mental status is at baseline.  Psychiatric:        Mood and Affect: Mood normal.     Labs reviewed: Recent Labs    03/01/23 1450 06/18/23 0000 07/30/23 0000 08/24/23 1236  NA 142 144 139 146*  K 4.2 4.3 4.2 4.9  CL 107 110* 107 111*  CO2  --  23* 22 18*  GLUCOSE 109*  --   --  97  BUN 20 29* 34* 41*  CREATININE 1.10 1.3 1.4* 1.61*  CALCIUM  --  9.4 8.6* 9.3   Recent Labs    10/17/22 0000 06/18/23 0000  AST 16 16  ALT 15 15  ALKPHOS 111 91  ALBUMIN 4.1 3.9   Recent Labs    10/17/22 0000 03/01/23 1450 06/18/23 0000  WBC 7.6  --  6.8  HGB 12.6* 12.6* 11.6*  HCT 36* 37.0* 34*  PLT 217  --  188   Lab Results  Component Value Date   TSH 6.02 (A) 06/18/2023   Lab Results  Component Value Date   HGBA1C 5.5 11/23/2016   Lab Results  Component Value Date   CHOL 133 10/17/2022   HDL 49 10/17/2022   LDLCALC 65 10/17/2022   TRIG 122 10/17/2022   CHOLHDL 2.4 11/23/2016    Significant Diagnostic Results in last 30 days:  VAS US CAROTID Result Date:  08/30/2023 Carotid Arterial Duplex Study Patient Name:  AMALIO LOE  Date of Exam:   08/29/2023 Medical Rec #: [id number]          Accession #:    [id number] Date of Birth: 06-30-1931          Patient Gender: M Patient Age:   31 years Exam Location:  [facility] Procedure:      VAS US CAROTID Referring Phys: Christiane Ha BERRY --------------------------------------------------------------------------------  Indications:       Carotid artery disease, right endarterectomy and Patient                    denies any cerebrovascular symptoms. Risk Factors:      Hypertension, hyperlipidemia, past history of smoking,                    coronary artery disease, PAD. Other Factors:     Right carotid endarterectomy 11/15/2010. Comparison Study:  Prior carotid duplex 08/28/2022 showed highest velocities in                    the right mid ICA 150/26 cm/s and left proximal ICA 458/59                    cm/s. Performing Technologist: Carlos American RVT, RDCS (AE), RDMS  Examination Guidelines: A complete evaluation includes B-mode imaging, spectral  Doppler, color Doppler, and power Doppler as needed of all accessible portions of each vessel. Bilateral testing is considered an integral part of a complete examination. Limited examinations for reoccurring indications may be performed as noted.  Right Carotid Findings: +----------+--------+--------+--------+---------------------+------------------+           PSV cm/sEDV cm/sStenosisPlaque Description   Comments           +----------+--------+--------+--------+---------------------+------------------+ CCA Prox  201     20      <50%                                            +----------+--------+--------+--------+---------------------+------------------+ CCA Mid   158     12      <50%    focal, smooth and                                                         calcific                                 +----------+--------+--------+--------+---------------------+------------------+ CCA Distal95      11                                   intimal thickening +----------+--------+--------+--------+---------------------+------------------+ ICA Prox  76      12                                   S/P CEA            +----------+--------+--------+--------+---------------------+------------------+ ICA Mid   113     19      1-39%                                           +----------+--------+--------+--------+---------------------+------------------+ ICA Distal129     23                                                      +----------+--------+--------+--------+---------------------+------------------+ ECA       298     13      >50%    focal, irregular and                                                      calcific                                +----------+--------+--------+--------+---------------------+------------------+ +----------+--------+-------+--------+-------------------+           PSV cm/sEDV cmsDescribeArm Pressure (mmHG) +----------+--------+-------+--------+-------------------+ [id number]  Stenotic136                 +----------+--------+-------+--------+-------------------+ +---------+--------+--+--------+-+---------+ VertebralPSV cm/s52EDV cm/s0Antegrade +---------+--------+--+--------+-+---------+  Left Carotid Findings: +----------+--------+--------+--------+------------------+---------------------+           PSV cm/sEDV cm/sStenosisPlaque DescriptionComments              +----------+--------+--------+--------+------------------+---------------------+ CCA Prox  39      0                                 dampened low flow     +----------+--------+--------+--------+------------------+---------------------+ CCA Mid   39      8               diffuse, irregulardampened low flow                                        and calcific                            +----------+--------+--------+--------+------------------+---------------------+ CCA Distal38      7               diffuse, irregulardampened low flow                                       and calcific                            +----------+--------+--------+--------+------------------+---------------------+ ICA Prox  37      11                                shadowing; can not                                                        rule out higher grade                                                     stenosis versus                                                           occlusion within.     +----------+--------+--------+--------+------------------+---------------------+ ICA Mid   37      11                                dampened flow         +----------+--------+--------+--------+------------------+---------------------+ ICA Distal47      13  dampened flow         +----------+--------+--------+--------+------------------+---------------------+ ECA       100     25                                shadowing; can not                                                        rule out higher grade                                                     stenosis versus                                                           occlusion within      +----------+--------+--------+--------+------------------+---------------------+ +----------+--------+--------+---------+-------------------+           PSV cm/sEDV cm/sDescribe Arm Pressure (mmHG) +----------+--------+--------+---------+-------------------+ [id number]             Turbulent136                 +----------+--------+--------+---------+-------------------+ +---------+--------+--+--------+--+---------+ VertebralPSV cm/s81EDV cm/s18Antegrade +---------+--------+--+--------+--+---------+  Utilized 5C1 probe for ICA visualization. Unable to duplicate prior ICA velocities. Consider alternate imaging if clinically indicated.  Summary: Right Carotid: Velocities in the right ICA are consistent with a 1-39% stenosis.                Non-hemodynamically significant plaque <50% noted in the CCA. The                ECA appears >50% stenosed. S/P CEA. Left Carotid: Limited evaluation of proximal ICA and ECA due to increased               shadowing plaque. If clinically indicated, consider alternative               imaging. Dampened flow seen throughout. Vertebrals:  Bilateral vertebral arteries demonstrate antegrade flow. Subclavians: Bilateral subclavian artery flow was disturbed. *See table(s) above for measurements and observations. Suggest follow up study in 12 months. Electronically signed by Dina Rich MD on 08/30/2023 at 4:14:24 PM.    Final     Assessment/Plan 1. Infected skin tear (Primary)  - cephALEXin (KEFLEX) 500 MG capsule; Take 1 capsule (500 mg total) by mouth 3 (three) times daily for 7 days. Continue polymem dressing changes twice weekly and monitor for improvement Recommend updated Tdap vaccine.   2. Pustule Right groin area (small) Starting antimicrobial therapy     Family/ staff Communication: nurse/resident  Labs/tests ordered:  NA

## 2023-09-14 ENCOUNTER — Ambulatory Visit (HOSPITAL_COMMUNITY): Payer: Medicare Other | Attending: Physician Assistant

## 2023-09-14 DIAGNOSIS — R600 Localized edema: Secondary | ICD-10-CM | POA: Diagnosis not present

## 2023-09-14 LAB — ECHOCARDIOGRAM COMPLETE
Area-P 1/2: 2.62 cm2
S' Lateral: 2.4 cm

## 2023-09-20 DIAGNOSIS — R293 Abnormal posture: Secondary | ICD-10-CM | POA: Diagnosis not present

## 2023-09-20 DIAGNOSIS — Z741 Need for assistance with personal care: Secondary | ICD-10-CM | POA: Diagnosis not present

## 2023-09-20 DIAGNOSIS — M1712 Unilateral primary osteoarthritis, left knee: Secondary | ICD-10-CM | POA: Diagnosis not present

## 2023-09-21 DIAGNOSIS — Z741 Need for assistance with personal care: Secondary | ICD-10-CM | POA: Diagnosis not present

## 2023-09-21 DIAGNOSIS — M1712 Unilateral primary osteoarthritis, left knee: Secondary | ICD-10-CM | POA: Diagnosis not present

## 2023-09-21 DIAGNOSIS — R293 Abnormal posture: Secondary | ICD-10-CM | POA: Diagnosis not present

## 2023-09-24 DIAGNOSIS — M1712 Unilateral primary osteoarthritis, left knee: Secondary | ICD-10-CM | POA: Diagnosis not present

## 2023-09-24 DIAGNOSIS — Z741 Need for assistance with personal care: Secondary | ICD-10-CM | POA: Diagnosis not present

## 2023-09-24 DIAGNOSIS — R293 Abnormal posture: Secondary | ICD-10-CM | POA: Diagnosis not present

## 2023-09-25 ENCOUNTER — Inpatient Hospital Stay (HOSPITAL_COMMUNITY)
Admission: EM | Admit: 2023-09-25 | Discharge: 2023-10-01 | DRG: 291 | Disposition: A | Discharge status: Skilled Nursing Facility | Payer: Medicare Other | Source: Skilled Nursing Facility | Attending: Family Medicine | Admitting: Family Medicine

## 2023-09-25 ENCOUNTER — Other Ambulatory Visit: Payer: Self-pay

## 2023-09-25 ENCOUNTER — Emergency Department (HOSPITAL_COMMUNITY): Payer: Medicare Other

## 2023-09-25 ENCOUNTER — Telehealth: Payer: Self-pay | Admitting: Home Health

## 2023-09-25 ENCOUNTER — Encounter (HOSPITAL_COMMUNITY): Payer: Self-pay

## 2023-09-25 DIAGNOSIS — Z808 Family history of malignant neoplasm of other organs or systems: Secondary | ICD-10-CM

## 2023-09-25 DIAGNOSIS — Z860101 Personal history of adenomatous and serrated colon polyps: Secondary | ICD-10-CM

## 2023-09-25 DIAGNOSIS — Z1152 Encounter for screening for COVID-19: Secondary | ICD-10-CM

## 2023-09-25 DIAGNOSIS — Z7902 Long term (current) use of antithrombotics/antiplatelets: Secondary | ICD-10-CM

## 2023-09-25 DIAGNOSIS — E669 Obesity, unspecified: Secondary | ICD-10-CM | POA: Diagnosis present

## 2023-09-25 DIAGNOSIS — Z9862 Peripheral vascular angioplasty status: Secondary | ICD-10-CM

## 2023-09-25 DIAGNOSIS — G9340 Encephalopathy, unspecified: Secondary | ICD-10-CM | POA: Diagnosis not present

## 2023-09-25 DIAGNOSIS — N1832 Chronic kidney disease, stage 3b: Secondary | ICD-10-CM | POA: Diagnosis not present

## 2023-09-25 DIAGNOSIS — R54 Age-related physical debility: Secondary | ICD-10-CM | POA: Diagnosis present

## 2023-09-25 DIAGNOSIS — Z9889 Other specified postprocedural states: Secondary | ICD-10-CM

## 2023-09-25 DIAGNOSIS — I70201 Unspecified atherosclerosis of native arteries of extremities, right leg: Secondary | ICD-10-CM | POA: Diagnosis not present

## 2023-09-25 DIAGNOSIS — I480 Paroxysmal atrial fibrillation: Secondary | ICD-10-CM | POA: Diagnosis not present

## 2023-09-25 DIAGNOSIS — I739 Peripheral vascular disease, unspecified: Secondary | ICD-10-CM | POA: Diagnosis not present

## 2023-09-25 DIAGNOSIS — I872 Venous insufficiency (chronic) (peripheral): Secondary | ICD-10-CM | POA: Diagnosis not present

## 2023-09-25 DIAGNOSIS — B965 Pseudomonas (aeruginosa) (mallei) (pseudomallei) as the cause of diseases classified elsewhere: Secondary | ICD-10-CM | POA: Diagnosis not present

## 2023-09-25 DIAGNOSIS — I714 Abdominal aortic aneurysm, without rupture, unspecified: Secondary | ICD-10-CM | POA: Diagnosis present

## 2023-09-25 DIAGNOSIS — Z888 Allergy status to other drugs, medicaments and biological substances status: Secondary | ICD-10-CM

## 2023-09-25 DIAGNOSIS — N400 Enlarged prostate without lower urinary tract symptoms: Secondary | ICD-10-CM | POA: Diagnosis present

## 2023-09-25 DIAGNOSIS — I130 Hypertensive heart and chronic kidney disease with heart failure and stage 1 through stage 4 chronic kidney disease, or unspecified chronic kidney disease: Secondary | ICD-10-CM | POA: Diagnosis not present

## 2023-09-25 DIAGNOSIS — G8929 Other chronic pain: Secondary | ICD-10-CM | POA: Diagnosis not present

## 2023-09-25 DIAGNOSIS — Z6831 Body mass index (BMI) 31.0-31.9, adult: Secondary | ICD-10-CM

## 2023-09-25 DIAGNOSIS — R0602 Shortness of breath: Secondary | ICD-10-CM | POA: Diagnosis not present

## 2023-09-25 DIAGNOSIS — I701 Atherosclerosis of renal artery: Secondary | ICD-10-CM | POA: Diagnosis not present

## 2023-09-25 DIAGNOSIS — Z9089 Acquired absence of other organs: Secondary | ICD-10-CM

## 2023-09-25 DIAGNOSIS — M549 Dorsalgia, unspecified: Secondary | ICD-10-CM | POA: Diagnosis present

## 2023-09-25 DIAGNOSIS — I5033 Acute on chronic diastolic (congestive) heart failure: Secondary | ICD-10-CM

## 2023-09-25 DIAGNOSIS — J9811 Atelectasis: Secondary | ICD-10-CM | POA: Diagnosis not present

## 2023-09-25 DIAGNOSIS — I4891 Unspecified atrial fibrillation: Secondary | ICD-10-CM

## 2023-09-25 DIAGNOSIS — Z8041 Family history of malignant neoplasm of ovary: Secondary | ICD-10-CM

## 2023-09-25 DIAGNOSIS — I878 Other specified disorders of veins: Secondary | ICD-10-CM | POA: Diagnosis present

## 2023-09-25 DIAGNOSIS — I350 Nonrheumatic aortic (valve) stenosis: Secondary | ICD-10-CM | POA: Diagnosis not present

## 2023-09-25 DIAGNOSIS — Z955 Presence of coronary angioplasty implant and graft: Secondary | ICD-10-CM

## 2023-09-25 DIAGNOSIS — Z8249 Family history of ischemic heart disease and other diseases of the circulatory system: Secondary | ICD-10-CM

## 2023-09-25 DIAGNOSIS — E785 Hyperlipidemia, unspecified: Secondary | ICD-10-CM | POA: Diagnosis not present

## 2023-09-25 DIAGNOSIS — G9341 Metabolic encephalopathy: Secondary | ICD-10-CM | POA: Diagnosis not present

## 2023-09-25 DIAGNOSIS — Z87442 Personal history of urinary calculi: Secondary | ICD-10-CM

## 2023-09-25 DIAGNOSIS — D509 Iron deficiency anemia, unspecified: Secondary | ICD-10-CM | POA: Diagnosis not present

## 2023-09-25 DIAGNOSIS — I10 Essential (primary) hypertension: Secondary | ICD-10-CM | POA: Diagnosis not present

## 2023-09-25 DIAGNOSIS — R7889 Finding of other specified substances, not normally found in blood: Secondary | ICD-10-CM | POA: Diagnosis not present

## 2023-09-25 DIAGNOSIS — R0989 Other specified symptoms and signs involving the circulatory and respiratory systems: Secondary | ICD-10-CM | POA: Diagnosis not present

## 2023-09-25 DIAGNOSIS — E8779 Other fluid overload: Secondary | ICD-10-CM | POA: Diagnosis not present

## 2023-09-25 DIAGNOSIS — Z993 Dependence on wheelchair: Secondary | ICD-10-CM

## 2023-09-25 DIAGNOSIS — Z7901 Long term (current) use of anticoagulants: Secondary | ICD-10-CM

## 2023-09-25 DIAGNOSIS — N390 Urinary tract infection, site not specified: Secondary | ICD-10-CM | POA: Diagnosis not present

## 2023-09-25 DIAGNOSIS — I2510 Atherosclerotic heart disease of native coronary artery without angina pectoris: Secondary | ICD-10-CM | POA: Diagnosis not present

## 2023-09-25 DIAGNOSIS — E875 Hyperkalemia: Secondary | ICD-10-CM | POA: Diagnosis not present

## 2023-09-25 DIAGNOSIS — D631 Anemia in chronic kidney disease: Secondary | ICD-10-CM | POA: Diagnosis not present

## 2023-09-25 DIAGNOSIS — R6889 Other general symptoms and signs: Secondary | ICD-10-CM | POA: Diagnosis not present

## 2023-09-25 DIAGNOSIS — I1A0 Resistant hypertension: Secondary | ICD-10-CM | POA: Diagnosis not present

## 2023-09-25 DIAGNOSIS — K3184 Gastroparesis: Secondary | ICD-10-CM | POA: Diagnosis present

## 2023-09-25 DIAGNOSIS — Z87891 Personal history of nicotine dependence: Secondary | ICD-10-CM

## 2023-09-25 DIAGNOSIS — E876 Hypokalemia: Secondary | ICD-10-CM | POA: Diagnosis present

## 2023-09-25 DIAGNOSIS — Z743 Need for continuous supervision: Secondary | ICD-10-CM | POA: Diagnosis not present

## 2023-09-25 DIAGNOSIS — E8770 Fluid overload, unspecified: Secondary | ICD-10-CM | POA: Diagnosis present

## 2023-09-25 DIAGNOSIS — Z95828 Presence of other vascular implants and grafts: Secondary | ICD-10-CM

## 2023-09-25 DIAGNOSIS — N179 Acute kidney failure, unspecified: Secondary | ICD-10-CM | POA: Diagnosis not present

## 2023-09-25 DIAGNOSIS — I440 Atrioventricular block, first degree: Secondary | ICD-10-CM | POA: Diagnosis present

## 2023-09-25 DIAGNOSIS — Z9079 Acquired absence of other genital organ(s): Secondary | ICD-10-CM

## 2023-09-25 DIAGNOSIS — Z79899 Other long term (current) drug therapy: Secondary | ICD-10-CM

## 2023-09-25 DIAGNOSIS — Z96652 Presence of left artificial knee joint: Secondary | ICD-10-CM | POA: Diagnosis present

## 2023-09-25 DIAGNOSIS — Z8711 Personal history of peptic ulcer disease: Secondary | ICD-10-CM

## 2023-09-25 DIAGNOSIS — J90 Pleural effusion, not elsewhere classified: Secondary | ICD-10-CM | POA: Diagnosis not present

## 2023-09-25 DIAGNOSIS — K219 Gastro-esophageal reflux disease without esophagitis: Secondary | ICD-10-CM | POA: Diagnosis present

## 2023-09-25 DIAGNOSIS — N189 Chronic kidney disease, unspecified: Secondary | ICD-10-CM | POA: Diagnosis not present

## 2023-09-25 DIAGNOSIS — Z66 Do not resuscitate: Secondary | ICD-10-CM | POA: Diagnosis not present

## 2023-09-25 DIAGNOSIS — I5031 Acute diastolic (congestive) heart failure: Secondary | ICD-10-CM | POA: Diagnosis not present

## 2023-09-25 DIAGNOSIS — Z7401 Bed confinement status: Secondary | ICD-10-CM

## 2023-09-25 DIAGNOSIS — Z96641 Presence of right artificial hip joint: Secondary | ICD-10-CM | POA: Diagnosis present

## 2023-09-25 DIAGNOSIS — R609 Edema, unspecified: Secondary | ICD-10-CM | POA: Diagnosis not present

## 2023-09-25 DIAGNOSIS — Z9849 Cataract extraction status, unspecified eye: Secondary | ICD-10-CM

## 2023-09-25 LAB — CBC WITH DIFFERENTIAL/PLATELET
Abs Immature Granulocytes: 0.03 10*3/uL (ref 0.00–0.07)
Basophils Absolute: 0 10*3/uL (ref 0.0–0.1)
Basophils Relative: 1 %
Eosinophils Absolute: 0.2 10*3/uL (ref 0.0–0.5)
Eosinophils Relative: 3 %
HCT: 36.7 % — ABNORMAL LOW (ref 39.0–52.0)
Hemoglobin: 11.7 g/dL — ABNORMAL LOW (ref 13.0–17.0)
Immature Granulocytes: 0 %
Lymphocytes Relative: 11 %
Lymphs Abs: 0.8 10*3/uL (ref 0.7–4.0)
MCH: 29.2 pg (ref 26.0–34.0)
MCHC: 31.9 g/dL (ref 30.0–36.0)
MCV: 91.5 fL (ref 80.0–100.0)
Monocytes Absolute: 0.6 10*3/uL (ref 0.1–1.0)
Monocytes Relative: 9 %
Neutro Abs: 5.6 10*3/uL (ref 1.7–7.7)
Neutrophils Relative %: 76 %
Platelets: 181 10*3/uL (ref 150–400)
RBC: 4.01 MIL/uL — ABNORMAL LOW (ref 4.22–5.81)
RDW: 13 % (ref 11.5–15.5)
WBC: 7.4 10*3/uL (ref 4.0–10.5)
nRBC: 0 % (ref 0.0–0.2)

## 2023-09-25 LAB — BASIC METABOLIC PANEL
Anion gap: 13 (ref 5–15)
BUN: 48 mg/dL — ABNORMAL HIGH (ref 8–23)
CO2: 18 mmol/L — ABNORMAL LOW (ref 22–32)
Calcium: 9.3 mg/dL (ref 8.9–10.3)
Chloride: 111 mmol/L (ref 98–111)
Creatinine, Ser: 1.83 mg/dL — ABNORMAL HIGH (ref 0.61–1.24)
GFR, Estimated: 35 mL/min — ABNORMAL LOW (ref >60)
Glucose, Bld: 126 mg/dL — ABNORMAL HIGH (ref 70–99)
Potassium: 5.2 mmol/L — ABNORMAL HIGH (ref 3.5–5.1)
Sodium: 142 mmol/L (ref 135–145)

## 2023-09-25 LAB — BRAIN NATRIURETIC PEPTIDE: B Natriuretic Peptide: 488.2 pg/mL — ABNORMAL HIGH (ref 0.0–100.0)

## 2023-09-25 LAB — TROPONIN I (HIGH SENSITIVITY): Troponin I (High Sensitivity): 6 ng/L (ref <18)

## 2023-09-25 NOTE — ED Provider Triage Note (Signed)
 Emergency Medicine Provider Triage Evaluation Note  Nathan Santiago , a 89 y.o. male  was evaluated in triage.  Pt complains of elevated BNP. He reports asymptomatic but Cardiology notes from today mention fatigue and lethargy as well as leg swelling.  Review of Systems  Positive:  Negative:   Physical Exam  BP (!) 179/70 (BP Location: Left Arm)   Pulse 64   Temp 98.1 F (36.7 C) (Oral)   Resp 16   SpO2 93%  Gen:   Awake, no distress   Resp:  Normal effort  MSK:   Moves extremities without difficulty  Other:    Medical Decision Making  Medically screening exam initiated at 9:34 PM.  Appropriate orders placed.  [facility]lton [facility] was informed that the remainder of the evaluation will be completed by another provider, this initial triage assessment does not replace that evaluation, and the importance of remaining in the ED until their evaluation is complete.     Glyn Ade, MD 09/25/23 2138

## 2023-09-25 NOTE — ED Triage Notes (Signed)
 Pt BIB [facility] from [facility] with a c/o an elevated BNP at 1209. They also stated pt has bilateral lower leg edema. Pt has no complaints and denies any pain at this time.

## 2023-09-25 NOTE — Telephone Encounter (Signed)
 Patient's SNF nurse Vevelyn Royals called after hour today, reports patient's BNP >1200, he has been more lethargic, increasingly weak, has more leg edema. Advised nurse to send the patient to nearest ER for evaluation of CHF.

## 2023-09-26 ENCOUNTER — Encounter (HOSPITAL_COMMUNITY): Payer: Self-pay | Admitting: Internal Medicine

## 2023-09-26 DIAGNOSIS — I1A0 Resistant hypertension: Secondary | ICD-10-CM

## 2023-09-26 DIAGNOSIS — E8779 Other fluid overload: Secondary | ICD-10-CM | POA: Diagnosis not present

## 2023-09-26 DIAGNOSIS — I10 Essential (primary) hypertension: Secondary | ICD-10-CM | POA: Diagnosis not present

## 2023-09-26 DIAGNOSIS — I5033 Acute on chronic diastolic (congestive) heart failure: Secondary | ICD-10-CM | POA: Diagnosis not present

## 2023-09-26 DIAGNOSIS — E8770 Fluid overload, unspecified: Secondary | ICD-10-CM | POA: Diagnosis present

## 2023-09-26 MED ORDER — GABAPENTIN 100 MG PO CAPS
100.0000 mg | ORAL_CAPSULE | Freq: Three times a day (TID) | ORAL | Status: DC
  Administered 2023-09-26 – 2023-10-01 (×14): 100 mg via ORAL
  Filled 2023-09-26 (×16): qty 1

## 2023-09-26 MED ORDER — PANTOPRAZOLE SODIUM 40 MG PO TBEC
40.0000 mg | DELAYED_RELEASE_TABLET | Freq: Two times a day (BID) | ORAL | Status: DC
  Administered 2023-09-26 – 2023-10-01 (×11): 40 mg via ORAL
  Filled 2023-09-26 (×11): qty 1

## 2023-09-26 MED ORDER — ACETAMINOPHEN 325 MG PO TABS
650.0000 mg | ORAL_TABLET | Freq: Four times a day (QID) | ORAL | Status: DC | PRN
  Administered 2023-09-26 – 2023-09-29 (×2): 650 mg via ORAL
  Filled 2023-09-26 (×2): qty 2

## 2023-09-26 MED ORDER — HYDRALAZINE HCL 50 MG PO TABS
50.0000 mg | ORAL_TABLET | Freq: Three times a day (TID) | ORAL | Status: DC
  Administered 2023-09-26 – 2023-10-01 (×14): 50 mg via ORAL
  Filled 2023-09-26 (×14): qty 1

## 2023-09-26 MED ORDER — ATORVASTATIN CALCIUM 40 MG PO TABS
40.0000 mg | ORAL_TABLET | Freq: Every day | ORAL | Status: DC
  Administered 2023-09-26 – 2023-09-30 (×5): 40 mg via ORAL
  Filled 2023-09-26 (×5): qty 1

## 2023-09-26 MED ORDER — ONDANSETRON HCL 4 MG/2ML IJ SOLN
4.0000 mg | Freq: Four times a day (QID) | INTRAMUSCULAR | Status: DC | PRN
Start: 2023-09-26 — End: 2023-10-01

## 2023-09-26 MED ORDER — FUROSEMIDE 10 MG/ML IJ SOLN: 20.0000 mg | Freq: Every day | INTRAMUSCULAR | Status: DC

## 2023-09-26 MED ORDER — ONDANSETRON HCL 4 MG PO TABS: 4.0000 mg | ORAL_TABLET | Freq: Four times a day (QID) | ORAL | Status: DC | PRN

## 2023-09-26 MED ORDER — POLYSACCHARIDE IRON COMPLEX 150 MG PO CAPS
150.0000 mg | ORAL_CAPSULE | ORAL | Status: DC
  Administered 2023-09-26 – 2023-10-01 (×3): 150 mg via ORAL
  Filled 2023-09-26 (×3): qty 1

## 2023-09-26 MED ORDER — CHLORTHALIDONE 25 MG PO TABS
50.0000 mg | ORAL_TABLET | Freq: Every morning | ORAL | Status: DC
  Administered 2023-09-26 – 2023-09-27 (×2): 50 mg via ORAL
  Filled 2023-09-26 (×2): qty 2

## 2023-09-26 MED ORDER — MELATONIN 5 MG PO TABS
5.0000 mg | ORAL_TABLET | Freq: Every day | ORAL | Status: DC
Start: 2023-09-26 — End: 2023-10-01
  Administered 2023-09-26 – 2023-09-30 (×5): 5 mg via ORAL
  Filled 2023-09-26 (×5): qty 1

## 2023-09-26 MED ORDER — CLOPIDOGREL BISULFATE 75 MG PO TABS
75.0000 mg | ORAL_TABLET | Freq: Every evening | ORAL | Status: DC
  Administered 2023-09-26 – 2023-09-30 (×5): 75 mg via ORAL
  Filled 2023-09-26 (×5): qty 1

## 2023-09-26 MED ORDER — ENOXAPARIN SODIUM 40 MG/0.4ML IJ SOSY
40.0000 mg | PREFILLED_SYRINGE | INTRAMUSCULAR | Status: DC
  Administered 2023-09-26: 40 mg via SUBCUTANEOUS
  Filled 2023-09-26: qty 0.4

## 2023-09-26 MED ORDER — LORATADINE 10 MG PO TABS
10.0000 mg | ORAL_TABLET | Freq: Every day | ORAL | Status: DC
  Administered 2023-09-26 – 2023-09-30 (×5): 10 mg via ORAL
  Filled 2023-09-26 (×5): qty 1

## 2023-09-26 MED ORDER — CLONIDINE HCL 0.1 MG PO TABS
0.1000 mg | ORAL_TABLET | Freq: Three times a day (TID) | ORAL | Status: DC
  Administered 2023-09-26 – 2023-10-01 (×13): 0.1 mg via ORAL
  Filled 2023-09-26 (×14): qty 1

## 2023-09-26 MED ORDER — VITAMIN D 25 MCG (1000 UNIT) PO TABS
1000.0000 [IU] | ORAL_TABLET | Freq: Every morning | ORAL | Status: DC
  Administered 2023-09-26 – 2023-10-01 (×6): 1000 [IU] via ORAL
  Filled 2023-09-26 (×6): qty 1

## 2023-09-26 MED ORDER — HYDRALAZINE HCL 25 MG PO TABS: 50.0000 mg | ORAL_TABLET | Freq: Every evening | ORAL | Status: DC

## 2023-09-26 MED ORDER — FUROSEMIDE 10 MG/ML IJ SOLN
80.0000 mg | Freq: Once | INTRAMUSCULAR | Status: AC
  Administered 2023-09-26: 80 mg via INTRAVENOUS
  Filled 2023-09-26: qty 8

## 2023-09-26 MED ORDER — FUROSEMIDE 10 MG/ML IJ SOLN
40.0000 mg | Freq: Once | INTRAMUSCULAR | Status: AC
  Administered 2023-09-26: 40 mg via INTRAVENOUS
  Filled 2023-09-26: qty 4

## 2023-09-26 MED ORDER — TAMSULOSIN HCL 0.4 MG PO CAPS
0.4000 mg | ORAL_CAPSULE | Freq: Every day | ORAL | Status: DC
  Administered 2023-09-26 – 2023-09-30 (×5): 0.4 mg via ORAL
  Filled 2023-09-26 (×5): qty 1

## 2023-09-26 MED ORDER — HYDRALAZINE HCL 25 MG PO TABS: 50.0000 mg | ORAL_TABLET | Freq: Three times a day (TID) | ORAL | Status: DC

## 2023-09-26 MED ORDER — SENNOSIDES-DOCUSATE SODIUM 8.6-50 MG PO TABS
1.0000 | ORAL_TABLET | Freq: Every evening | ORAL | Status: DC | PRN
  Administered 2023-09-29: 1 via ORAL
  Filled 2023-09-26: qty 1

## 2023-09-26 MED ORDER — HYDRALAZINE HCL 25 MG PO TABS
25.0000 mg | ORAL_TABLET | Freq: Every day | ORAL | Status: DC
  Administered 2023-09-26: 25 mg via ORAL
  Filled 2023-09-26: qty 1

## 2023-09-26 MED ORDER — CARVEDILOL 12.5 MG PO TABS
12.5000 mg | ORAL_TABLET | Freq: Two times a day (BID) | ORAL | Status: DC
  Administered 2023-09-26: 12.5 mg via ORAL
  Filled 2023-09-26: qty 1

## 2023-09-26 MED ORDER — HYDRALAZINE HCL 20 MG/ML IJ SOLN
5.0000 mg | Freq: Four times a day (QID) | INTRAMUSCULAR | Status: DC | PRN
Start: 2023-09-26 — End: 2023-10-01

## 2023-09-26 MED ORDER — ISOSORBIDE DINITRATE 10 MG PO TABS
10.0000 mg | ORAL_TABLET | Freq: Three times a day (TID) | ORAL | Status: DC
  Administered 2023-09-26 – 2023-10-01 (×15): 10 mg via ORAL
  Filled 2023-09-26 (×15): qty 1

## 2023-09-26 MED ORDER — METOPROLOL TARTRATE 25 MG PO TABS
75.0000 mg | ORAL_TABLET | Freq: Two times a day (BID) | ORAL | Status: DC
  Administered 2023-09-26: 75 mg via ORAL
  Filled 2023-09-26: qty 3

## 2023-09-26 MED ORDER — ADULT MULTIVITAMIN W/MINERALS CH
1.0000 | ORAL_TABLET | Freq: Every morning | ORAL | Status: DC
  Administered 2023-09-26 – 2023-10-01 (×6): 1 via ORAL
  Filled 2023-09-26 (×6): qty 1

## 2023-09-26 MED ORDER — AMLODIPINE BESYLATE 10 MG PO TABS
10.0000 mg | ORAL_TABLET | Freq: Every day | ORAL | Status: DC
  Administered 2023-09-26: 10 mg via ORAL
  Filled 2023-09-26: qty 1
  Filled 2023-09-26: qty 2

## 2023-09-26 MED ORDER — FINASTERIDE 5 MG PO TABS
5.0000 mg | ORAL_TABLET | Freq: Every morning | ORAL | Status: DC
  Administered 2023-09-26 – 2023-10-01 (×6): 5 mg via ORAL
  Filled 2023-09-26 (×6): qty 1

## 2023-09-26 MED ORDER — GLUCOSAMINE-CHONDROITIN 750-600 MG PO TABS: 1.0000 | ORAL_TABLET | Freq: Two times a day (BID) | ORAL | Status: DC

## 2023-09-26 MED ORDER — ENOXAPARIN SODIUM 30 MG/0.3ML IJ SOSY
30.0000 mg | PREFILLED_SYRINGE | INTRAMUSCULAR | Status: DC
  Administered 2023-09-27: 30 mg via SUBCUTANEOUS
  Filled 2023-09-26: qty 0.3

## 2023-09-26 MED ORDER — ACETAMINOPHEN 650 MG RE SUPP: 650.0000 mg | Freq: Four times a day (QID) | RECTAL | Status: DC | PRN

## 2023-09-26 NOTE — Consult Note (Addendum)
 Cardiology Consultation   Patient ID: Nathan Santiago MRN: [id number]; DOB: January 08, 1931  Admit date: 09/25/2023 Date of Consult: 09/26/2023  PCP:  Mahlon Gammon, MD   [facility] Providers Cardiologist:  Nanetta Batty, MD   {  Patient Profile:   Nathan Santiago is a 89 y.o. male with a hx of CAD remote stening 2006, AAA, hypertension, hyperlipidemia, carotid artery disease CEA 2013, and PAD who is being seen 09/26/2023 for the evaluation of CHF at the request of Dr. Jonathon Bellows.  History of Present Illness:   Mr. Menning previously underwent stenting to his proximal, mid and distal RCA using overlapping stents as well as proximal AV groove circumflex with known occluded OM branch in 2006. Myoview was nonischemic in 2010.  Also with extensive PAD history.  His last peripheral angiography was in February 2023 showing 90% left renal artery stenosis, patent distal abdominal aortic stent and highly calcified left greater than right common femoral artery stenosis and focal bilateral mid SFA disease.  Referred to Dr. Durwin Nora however not felt to be a candidate for common femoral endarterectomy given age and comorbidities.  Fortunately has had stable PAD, he is bedbound so has not had any claudication symptoms.  He was seen in clinic January 24th with blood pressure that was difficult to control.  He was noted to be volume up then with 2+ pitting edema and crackles in his lungs but was asymptomatic.  Echocardiogram checked and done February 2025 that showed preserved biventricular function with no significant valvular disease.  He lives in assisted living, SNF nurse had called reporting patient had a BNP over 1200 and was more lethargic and with more weakness and peripheral edema.  Heart team recommended evaluation at the nearest ED.  Here he has had no complaints denies any shortness of breath, chest pain, orthopnea.  Chest x-ray showing small bilateral pleural effusions with some vascular  congestion.  He was given 1 dose of IV Lasix.[name]  He also has AKI/hyperkalemia.  He really has no complaints, he has a Foley catheter and is complaining of bilateral groin pain since then but otherwise feels comfortable.  Reports being compliant with all of his medications despite significantly elevated blood pressures between 182-210 systolic.  Potassium 5.2, creatinine 1.83 and rising.  BNP 488.  Troponins negative x 2.  Hemoglobin 11.7.  He tells me that he is feeling fine he does not have any shortness of breath and the swelling is no more than usual.  He thinks that his whole being here is a "misunderstanding ".  Past Medical History:  Diagnosis Date   AAA (abdominal aortic aneurysm) (HCC)    asymptomatic   AAA (abdominal aortic aneurysm) (HCC) 2010   peripheral  angiogram-- bilateral SFA DISEASE  and 60 to 70% infrarenaal abd. aortic stenosis with 15 -mm gradient   Adenomatous colon polyp 11/2003   CAD (coronary artery disease)    Gait abnormality 02/13/2017   Gastroparesis    pt denies   GERD (gastroesophageal reflux disease)    w/ LPR   History of kidney stones    x2   Hyperlipidemia    Hypertension    IDA (iron deficiency anemia)    Peripheral arterial disease (HCC)    RCEA  by Dr [name]   PUD (peptic ulcer disease)    pt unaware   Vertigo     Past Surgical History:  Procedure Laterality Date   ABDOMINAL AORTOGRAM W/LOWER EXTREMITY Bilateral 08/05/2018   Procedure: ABDOMINAL  AORTOGRAM W/LOWER EXTREMITY;  Surgeon: Runell Gess, MD;  Location: [facility] INVASIVE CV LAB;  Service: Cardiovascular;  Laterality: Bilateral;   ABDOMINAL AORTOGRAM W/LOWER EXTREMITY N/A 09/26/2021   Procedure: ABDOMINAL AORTOGRAM W/LOWER EXTREMITY;  Surgeon: Runell Gess, MD;  Location: [facility] INVASIVE CV LAB;  Service: Cardiovascular;  Laterality: N/A;   AORTOGRAM  04/24/2016    Abdominal aortogram, bilateral iliac angiogram, bifemoral runoff   CARDIAC CATHETERIZATION  05/12/2005   RCA    carotid doppler  01/24/2013   RICA endarterectomy,left CCA 0-49%; left bulb and prox ICA 50-69%; bilaateral subclavian < 50%   CAROTID ENDARTERECTOMY  11/01/2010   CATARACT EXTRACTION     COLONOSCOPY     CORONARY ANGIOPLASTY  05/18/2005   2 STENTS distal RCA AND PROXIMAL-MID RCA   5 total stents per pt.   CYSTOSCOPY/URETEROSCOPY/HOLMIUM LASER/STENT PLACEMENT Left 01/11/2018   Procedure: LEFT URETEROSCOPY/HOLMIUM LASER/STENT PLACEMENT;  Surgeon: Crist Fat, MD;  Location: [facility] ORS;  Service: Urology;  Laterality: Left;   DOPPLER ECHOCARDIOGRAPHY  02/07/2012   EF 55%,SHOWED NO ISCHEMIA    HERNIA REPAIR     umbilical   lower arterial  doppler  02/04/2013   aotra 1.5 x 1.5 cm; distal abd aorta 70-99%,proximal common iliac arteries -very stenotic with increased velocities>50%,may be falsely elevated as a result of residual plaque from the distal aorta stenosis   lower extremity doppler  June 18 ,2013   ABI'S ABNORMAL, RABI was 0.88 and LABI 0.75 ,with 3-vessel  run off   NM MYOVIEW LTD  MAY 23,2011   showed no significant ischemia;   NM MYOVIEW LTD  04/22/2008   ef 77%,exercise capcity 6 METS ,exaggerated blood pressure response to exercise   PERIPHERAL VASCULAR CATHETERIZATION N/A 04/24/2016   Procedure: Lower Extremity Angiography;  Surgeon: Runell Gess, MD;  Location: [facility] INVASIVE CV LAB;  Service: Cardiovascular;  Laterality: N/A;   PERIPHERAL VASCULAR CATHETERIZATION  04/24/2016   Procedure: Peripheral Vascular Intervention;  Surgeon: Runell Gess, MD;  Location: [facility] INVASIVE CV LAB;  Service: Cardiovascular;;  Aorta   retrograde central aortic catheterization  05/19/2005   cutting balloon atherectomy, c-circ stenosis with DES STENTING CYPHER   TONSILLECTOMY     TOTAL HIP ARTHROPLASTY Right 07/11/2022   Procedure: TOTAL HIP ARTHROPLASTY ANTERIOR APPROACH;  Surgeon: Durene Romans, MD;  Location: [facility];  Service: Orthopedics;  Laterality: Right;   TOTAL KNEE ARTHROPLASTY Left  01/03/2019   Procedure: TOTAL KNEE ARTHROPLASTY;  Surgeon: Beverely Low, MD;  Location: [facility];  Service: Orthopedics;  Laterality: Left;  with IS block   TRANSURETHRAL RESECTION OF PROSTATE N/A 09/08/2016   Procedure: TRANSURETHRAL RESECTION OF THE PROSTATE (TURP);  Surgeon: Crist Fat, MD;  Location: [facility];  Service: Urology;  Laterality: N/A;   TRANSURETHRAL RESECTION OF PROSTATE     10-10-17  Dr. Marlou Porch   TRANSURETHRAL RESECTION OF PROSTATE N/A 10/10/2017   Procedure: TRANSURETHRAL RESECTION OF THE PROSTATE (TURP);  Surgeon: Crist Fat, MD;  Location: [facility];  Service: Urology;  Laterality: N/A;   VASECTOMY        Inpatient Medications: Scheduled Meds:  amLODipine  10 mg Oral Daily   atorvastatin  40 mg Oral q1800   chlorthalidone  50 mg Oral q AM   cholecalciferol  1,000 Units Oral q AM   cloNIDine  0.1 mg Oral TID   clopidogrel  75 mg Oral QPM   enoxaparin (LOVENOX) injection  40 mg Subcutaneous Q24H   finasteride  5 mg Oral q  AM   [START ON 09/27/2023] furosemide  20 mg Intravenous Daily   gabapentin  100 mg Oral TID   hydrALAZINE  25 mg Oral Q1400   hydrALAZINE  50 mg Oral QPM   iron polysaccharides  150 mg Oral Q M,W,F   loratadine  10 mg Oral QHS   melatonin  5 mg Oral QHS   metoprolol tartrate  75 mg Oral BID   multivitamin with minerals  1 tablet Oral q AM   pantoprazole  40 mg Oral BID   tamsulosin  0.4 mg Oral QPC supper   Continuous Infusions:  PRN Meds: acetaminophen **OR** acetaminophen, hydrALAZINE, ondansetron **OR** ondansetron (ZOFRAN) IV, senna-docusate  Allergies:    Allergies  Allergen Reactions   Lisinopril Cough   Niaspan [Niacin Er (Antihyperlipidemic)] Rash    Social History:   Social History   Socioeconomic History   Marital status: Married    Spouse name: Nelva Bush   Number of children: 2   Years of education: BA   Highest education level: Not on file  Occupational History   Occupation: Retired  Tobacco Use   Smoking  status: Former    Current packs/day: 0.00    Average packs/day: 2.0 packs/day for 20.0 years (40.0 ttl pk-yrs)    Types: Cigarettes    Start date: 07/31/1948    Quit date: 07/31/1968    Years since quitting: 55.1   Smokeless tobacco: Never  Vaping Use   Vaping status: Never Used  Substance and Sexual Activity   Alcohol use: Not Currently    Alcohol/week: 4.0 standard drinks of alcohol    Types: 4 Glasses of wine per week   Drug use: Never   Sexual activity: Not Currently  Other Topics Concern   Not on file  Social History Narrative   Lives with wife   Caffeine use: Decaf coffee, tea   Left handed   Social Drivers of Health   Financial Resource Strain: Low Risk  (11/28/2022)   Overall Financial Resource Strain (CARDIA)    Difficulty of Paying Living Expenses: Not hard at all  Food Insecurity: Unknown (11/28/2022)   Hunger Vital Sign    Worried About Running Out of Food in the Last Year: Not on file    Ran Out of Food in the Last Year: Never true  Transportation Needs: No Transportation Needs (11/28/2022)   PRAPARE - Administrator, Civil Service (Medical): No    Lack of Transportation (Non-Medical): No  Physical Activity: Insufficiently Active (11/28/2022)   Exercise Vital Sign    Days of Exercise per Week: 3 days    Minutes of Exercise per Session: 30 min  Stress: No Stress Concern Present (11/28/2022)   [employer] of Occupational Health - Occupational Stress Questionnaire    Feeling of Stress : Not at all  Social Connections: Moderately Isolated (11/28/2022)   Social Connection and Isolation Panel [NHANES]    Frequency of Communication with Friends and Family: Three times a week    Frequency of Social Gatherings with Friends and Family: More than three times a week    Attends Religious Services: Never    Database administrator or Organizations: No    Attends Banker Meetings: Never    Marital Status: Married  Catering manager Violence: Not At  Risk (11/28/2022)   Humiliation, Afraid, Rape, and Kick questionnaire    Fear of Current or Ex-Partner: No    Emotionally Abused: No    Physically Abused: No  Sexually Abused: No    Family History:    Family History  Problem Relation Age of Onset   Ovarian cancer Sister    Heart disease Father    Brain cancer Brother        Tumor     ROS:  Please see the history of present illness.  All other ROS reviewed and negative.     Physical Exam/Data:   Vitals:   09/25/23 2027 09/26/23 0308 09/26/23 0624 09/26/23 0842  BP:  (!) 183/78 (!) 211/61 (!) 192/83  Pulse:  68 65 74  Resp:  14 18 18   Temp:  98 F (36.7 C) 98.7 F (37.1 C) 98.7 F (37.1 C)  TempSrc:      SpO2: 93% 93% 95% 93%   No intake or output data in the 24 hours ending 09/26/23 1225    08/27/2023    9:58 AM 08/24/2023   10:47 AM 07/16/2023   10:57 AM  Last 3 Weights  Weight (lbs) 182 lb 12.8 oz 184 lb 6.4 oz 181 lb 4.8 oz  Weight (kg) 82.918 kg 83.643 kg 82.237 kg     There is no height or weight on file to calculate BMI.  General: Tremors.  No acute distress. HEENT: normal Neck: Difficult to assess JVD Vascular: No carotid bruits; Distal pulses 2+ bilaterally Cardiac:  normal S1, S2; RRR; no murmur  Lungs: Positive crackles -> with diminished breath sounds and respiratory effort. Abd: Distended but firm abdomen Ext: 2+ edema with Tegaderm dressing for wound care, mild venous stasis discoloration changes. Musculoskeletal:  No deformities, BUE and BLE strength normal and equal Skin: warm and dry  Neuro:  CNs 2-12 intact, no focal abnormalities noted Psych:  Normal affect   EKG:  The EKG was personally reviewed and demonstrates: EKG with significant amount of artifact however appears to be sinus rhythm heart rate 60. Telemetry:  Telemetry was personally reviewed and demonstrates: Sinus rhythm heart rates in the 70s.  Relevant CV Studies: Echocardiogram 09/14/2023 1. Left ventricular ejection fraction,  by estimation, is 60 to 65%. Left  ventricular ejection fraction by 3D volume is 63 %. The left ventricle has  normal function. The left ventricle has no regional wall motion  abnormalities. There is mild concentric  left ventricular hypertrophy. Left ventricular diastolic parameters are  consistent with Grade I diastolic dysfunction (impaired relaxation). The  average left ventricular global longitudinal strain is -24.2 %. The global  longitudinal strain is normal.   2. Right ventricular systolic function is normal. The right ventricular  size is normal.   3. Left atrial size was mildly dilated.   4. The mitral valve is normal in structure. Trivial mitral valve  regurgitation. No evidence of mitral stenosis.   5. The aortic valve is tricuspid. There is mild calcification of the  aortic valve. Aortic valve regurgitation is not visualized. Aortic valve  sclerosis/calcification is present, without any evidence of aortic  stenosis.   6. The inferior vena cava is normal in size with <50% respiratory  variability, suggesting right atrial pressure of 8 mmHg.    Laboratory Data:  High Sensitivity Troponin:   Recent Labs  Lab 09/25/23 2212  TROPONINIHS 6     Chemistry Recent Labs  Lab 09/25/23 2212  NA 142  K 5.2*  CL 111  CO2 18*  GLUCOSE 126*  BUN 48*  CREATININE 1.83*  CALCIUM 9.3  GFRNONAA 35*  ANIONGAP 13    No results for input(s): "PROT", "ALBUMIN", "AST", "ALT", "  ALKPHOS", "BILITOT" in the last 168 hours. Lipids No results for input(s): "CHOL", "TRIG", "HDL", "LABVLDL", "LDLCALC", "CHOLHDL" in the last 168 hours.  Hematology Recent Labs  Lab 09/25/23 2212  WBC 7.4  RBC 4.01*  HGB 11.7*  HCT 36.7*  MCV 91.5  MCH 29.2  MCHC 31.9  RDW 13.0  PLT 181   Thyroid No results for input(s): "TSH", "FREET4" in the last 168 hours.  BNP Recent Labs  Lab 09/25/23 2212  BNP 488.2*    DDimer No results for input(s): "DDIMER" in the last 168  hours.   Radiology/Studies:  DG Chest 2 View Result Date: 09/25/2023 CLINICAL DATA:  Short of breath, lower extremity swelling EXAM: CHEST - 2 VIEW COMPARISON:  03/01/2023 FINDINGS: Frontal and lateral views of the chest demonstrate a stable cardiac silhouette. There is chronic central pulmonary vascular congestion. There are small bilateral pleural effusions. Minimal consolidation at the left lung base most consistent with atelectasis. No pneumothorax. No acute bony abnormalities. IMPRESSION: 1. Stable enlarged cardiac silhouette and chronic central vascular congestion. 2. Small bilateral pleural effusions, with left basilar atelectasis. Electronically Signed   By: Sharlet Salina M.D.   On: 09/25/2023 22:21     Assessment and Plan:   Acute on chronic HFpEF Noted to be volume up in January's appointment, suspect that he has been hypervolemic since then.  Obviously volume up today.  Fortunately completely asymptomatic but with very poor diuretic response on IV Lasix 40 mg. Increasing to IV Lasix 80 mg once for now, suspect he needs twice daily dosing or higher dose.  Will reevaluate diuretic response. ->  Minimal response to the lower dose of Lasix, will give 1 higher dose and reassess.  He says he is urinated, but does not much urine in the drainage began. Please be sure were getting accurate weights, I's and O's.  Weight in the clinic a month ago was around 184 pounds  CAD with remote stenting 2006 PAD Stable disease with no chest pain or claudication symptoms now that he is wheelchair-bound.  He had peripheral angiography 2023 showing significant disease in lower extremities, referred to Dr. Edilia Bo, but not felt to be a candidate for common femoral endarterectomy given age and comorbidities. Currently on atorvastatin 40 mg, Plavix.  Hypertension Renal artery stenosis Uncontrolled with systolics between 182 210.  AKI also limiting treatment.  Also has history of 90% ostial right left renal  artery stenosis.  However with advanced age not sure if there is anything to be done. Continue with amlodipine 10 mg Was on Lopressor 75 mg twice daily, transitioning this to carvedilol 12.5 mg twice daily.  Uptitrate as necessary On chlorthalidone 50 mg, clonidine 0.1 mg 3 times daily (hoping to titrate this off), Isordil 10 mg 3 times daily was also added, changing hydralazine to 50 mg 3 times daily.  Hopefully when renal function improves can add back ARB. Very difficult trying to control blood pressures in this healthy gentleman.  He is on lots of different medications that are unfavorable for his age most notably high-dose chlorthalidone and clonidine.  I agree with converting to carvedilol from Lopressor for better CHF and BP management.  He is on hydralazine for afterload reduction as the ARB is held because of worsening renal function.  Will add Isordil to be taken with it and make it hydralazine 50 mg 3 times daily plus Isordil 20 mg 3 times daily.  Once we are able to reinitiate ARB this would be more beneficial.  Wheelchair-bound/deconditioned.  Hyperkalemia Hopefully will improve with diuresis.  Check BMP tomorrow.  CKD Creatinine continues to trend up.  Hopefully with some diuresis may help with renal congestion.    Risk Assessment/Risk Scores:    For questions or updates, please contact [facility] Please consult [email or web address] for contact info under    Signed, Abagail Kitchens, PA-C  09/26/2023 12:25 PM    ATTENDING ATTESTATION  I have seen, examined and evaluated the patient this afternoon in consultation along with Abagail Kitchens, PA-C .  After reviewing all the available data and chart, we discussed the patients laboratory, study & physical findings as well as symptoms in detail.  I agree with his findings, examination as well as impression recommendations as per our discussion.    Attending adjustments noted in italics.   Patient was admitted with probably  mild acute on chronic diastolic heart failure/HFpEF.  Blood pressure elevated and he seems to be mildly volume overloaded. Will try to adjust medications for better BP control and give at least 1 trial of additional diuretic and then reassess in the morning.  Summarized above.    Marykay Lex, MD, MS Bryan Lemma, M.D., M.S. Interventional Cardiologist  [facility]  Pager # [phone] Phone # [phone] [address] [place], [place] [address]

## 2023-09-26 NOTE — ED Provider Notes (Signed)
 [facility] Provider Note   CSN: [id number] Arrival date & time: 09/25/23  2024     History  Chief Complaint  Patient presents with   Abnormal Lab    Nathan Santiago is a 89 y.o. male.   Abnormal Lab   89 year old male presents emergency department with complaints of abnormal lab.  States he had BNP drawn by his nursing home in the outpatient setting yesterday due to increased lower extremity swelling was elevated of 1200 prompting visit to the emergency department.  States that he does not feel pain in his chest is short of breath.  States that he has dealt with more generalized weakness over the past month or so has not necessarily worsened acutely over the past few days.  States that he feels like his legs "may be a little more swollen" but states they are swollen at baseline.  Denies any abdominal pain, nausea, vomiting, urinary symptoms, change in cough, fever, chills, night sweats.  Past medical history significant for abdominal aortic aneurysm, CAD, GERD, CHF with low ventricular ejection fraction 60 to 65%, hypertension, hyperlipidemia, gastroparesis, orthostatic lightheadedness, peripheral artery disease  Home Medications Prior to Admission medications   Medication Sig Start Date End Date Taking? Authorizing Provider  amLODipine (NORVASC) 10 MG tablet Take 10 mg by mouth daily. 06/07/22  Yes [provider]  Apoaequorin (PREVAGEN PO) Take 1 tablet by mouth in the morning.   Yes [provider]  atorvastatin (LIPITOR) 40 MG tablet Take 40 mg by mouth daily at 6 PM.  11/14/16  Yes [provider]  Biotin 10 MG TABS Take 10 mg by mouth in the morning.   Yes [provider]  cetirizine (ZYRTEC) 10 MG tablet Take 1 tablet (10 mg total) by mouth at bedtime. 11/16/22  Yes Wert, Trula Ore, NP  chlorthalidone (HYGROTON) 50 MG tablet Take 50 mg by mouth in the morning. Hold if BP < 95   Yes [provider]  cholecalciferol (VITAMIN D3) 25 MCG (1000 UT) tablet Take 1,000 Units by mouth in the morning.   Yes [provider]  cloNIDine (CATAPRES) 0.1 MG tablet Take 1 tablet (0.1 mg total) by mouth 3 (three) times daily. Patient taking differently: Take 0.1 mg by mouth 3 (three) times daily. Hold if BP < 95 07/16/23  Yes Wert, Trula Ore, NP  clopidogrel (PLAVIX) 75 MG tablet Take 75 mg by mouth every evening.    Yes [provider]  finasteride (PROSCAR) 5 MG tablet Take 5 mg by mouth in the morning.   Yes [provider]  gabapentin (NEURONTIN) 100 MG capsule Take 100 mg by mouth 3 (three) times daily.   Yes [provider]  Glucosamine-Chondroitin 750-600 MG TABS Take 1 tablet by mouth 2 (two) times daily.   Yes [provider]  hydrALAZINE (APRESOLINE) 25 MG tablet Take 25 mg by mouth in the morning and at bedtime. Hold if BP < 95   Yes [provider]  hydrALAZINE (APRESOLINE) 50 MG tablet Take 50 mg by mouth every evening. Hold if BP <95   Yes [provider]  HYDROcodone bit-homatropine (HYCODAN) 5-1.5 MG/5ML syrup Take 5 mLs by mouth at bedtime as needed for cough. 08/23/23  Yes Fletcher Anon, NP  iron polysaccharides (NIFEREX) 150 MG capsule Take 150 mg by mouth every Monday, Wednesday, and Friday.   Yes [provider]  losartan (COZAAR) 50 MG tablet Take 50 mg by mouth 2 (two)  times daily.   Yes [provider]  melatonin 5 MG TABS Take 1 tablet (5 mg total) by mouth at bedtime. 10/13/22  Yes Fletcher Anon, NP  metoprolol tartrate (LOPRESSOR) 50 MG tablet Take 75 mg by mouth 2 (two) times daily.   Yes [provider]  Multiple Vitamin (MULTIVITAMIN WITH MINERALS) TABS tablet Take 1 tablet by mouth in the morning.   Yes [provider]  pantoprazole (PROTONIX) 40 MG tablet Take 40 mg by mouth 2 (two) times daily.   Yes [provider]  potassium chloride SA (K-DUR,KLOR-CON)  20 MEQ tablet Take 20 mEq by mouth every evening.  07/16/13  Yes [provider]  tamsulosin (FLOMAX) 0.4 MG CAPS capsule Take 1 capsule (0.4 mg total) by mouth daily after supper. 05/15/22  Yes Briant Cedar, MD  acetaminophen (TYLENOL) 500 MG tablet Take 1 tablet (500 mg total) by mouth 2 (two) times daily as needed. Patient not taking: Reported on 09/26/2023 01/13/19   Fletcher Anon, NP  cephALEXin (KEFLEX) 500 MG capsule Take 500 mg by mouth 3 (three) times daily. Patient not taking: Reported on 09/26/2023    [provider]  furosemide (LASIX) 20 MG tablet Take 20 mg by mouth daily as needed for edema. Patient not taking: Reported on 09/26/2023    [provider]  ondansetron (ZOFRAN) 4 MG tablet Take 4 mg by mouth every 6 (six) hours as needed for nausea or vomiting. Patient not taking: Reported on 09/26/2023    [provider]      Allergies    Lisinopril and Niaspan [niacin er (antihyperlipidemic)]    Review of Systems   Review of Systems  All other systems reviewed and are negative.   Physical Exam Updated Vital Signs BP (!) 192/83 (BP Location: Right Arm)   Pulse 74   Temp 98.7 F (37.1 C)   Resp 18   SpO2 93%  Physical Exam Vitals and nursing note reviewed.  Constitutional:      General: He is not in acute distress.    Appearance: He is well-developed.  HENT:     Head: Normocephalic and atraumatic.  Eyes:     Conjunctiva/sclera: Conjunctivae normal.  Cardiovascular:     Rate and Rhythm: Normal rate and regular rhythm.  Pulmonary:     Effort: Pulmonary effort is normal. No respiratory distress.     Breath sounds: Wheezing and rales present.  Abdominal:     Palpations: Abdomen is soft.     Tenderness: There is no abdominal tenderness.  Musculoskeletal:        General: No swelling.     Cervical back: Neck supple.     Right lower leg: Edema present.     Left lower leg: Edema present.     Comments: 2+ pitting edema  bilateral lower extremities.  Skin:    General: Skin is warm and dry.     Capillary Refill: Capillary refill takes less than 2 seconds.  Neurological:     Mental Status: He is alert.  Psychiatric:        Mood and Affect: Mood normal.     ED Results / Procedures / Treatments   Labs (all labs ordered are listed, but only abnormal results are displayed) Labs Reviewed  BRAIN NATRIURETIC PEPTIDE - Abnormal; Notable for the following components:      Result Value   B Natriuretic Peptide 488.2 (*)    All other components within normal limits  CBC WITH DIFFERENTIAL/PLATELET - Abnormal;  Notable for the following components:   RBC 4.01 (*)    Hemoglobin 11.7 (*)    HCT 36.7 (*)    All other components within normal limits  BASIC METABOLIC PANEL - Abnormal; Notable for the following components:   Potassium 5.2 (*)    CO2 18 (*)    Glucose, Bld 126 (*)    BUN 48 (*)    Creatinine, Ser 1.83 (*)    GFR, Estimated 35 (*)    All other components within normal limits  TROPONIN I (HIGH SENSITIVITY)    EKG EKG Interpretation Date/Time:  Tuesday September 25 2023 22:21:31 EST Ventricular Rate:  60 PR Interval:  228 QRS Duration:  68 QT Interval:  430 QTC Calculation: 430 R Axis:   55  Text Interpretation: Sinus rhythm with 1st degree A-V block Otherwise normal ECG When compared with ECG of 24-Aug-2023 10:55, Poor data quality Confirmed by Linwood Dibbles [phone]) on 09/26/2023 9:08:44 AM  Radiology DG Chest 2 View Result Date: 09/25/2023 CLINICAL DATA:  Short of breath, lower extremity swelling EXAM: CHEST - 2 VIEW COMPARISON:  03/01/2023 FINDINGS: Frontal and lateral views of the chest demonstrate a stable cardiac silhouette. There is chronic central pulmonary vascular congestion. There are small bilateral pleural effusions. Minimal consolidation at the left lung base most consistent with atelectasis. No pneumothorax. No acute bony abnormalities. IMPRESSION: 1. Stable enlarged cardiac  silhouette and chronic central vascular congestion. 2. Small bilateral pleural effusions, with left basilar atelectasis. Electronically Signed   By: Sharlet Salina M.D.   On: 09/25/2023 22:21    Procedures Procedures    Medications Ordered in ED Medications  furosemide (LASIX) injection 40 mg (has no administration in time range)    ED Course/ Medical Decision Making/ A&P Clinical Course as of 09/26/23 1022  Wed Sep 26, 2023  0951 Consulted  Dr. Dayna Barker of hospitalist who agreed with admission and assume further treatment/care. [CR]    Clinical Course User Index [CR] Peter Garter, PA                                 Medical Decision Making Risk Prescription drug management. Decision regarding hospitalization.   This patient presents to the ED for concern of abnormal lab, this involves an extensive number of treatment options, and is a complaint that carries with it a high risk of complications and morbidity.  The differential diagnosis includes CHF, dependent edema, DVT, medication side effect, other   Co morbidities that complicate the patient evaluation  See HPI   Additional history obtained:  Additional history obtained from EMR External records from outside source obtained and reviewed including hospital records   Lab Tests:  I Ordered, and personally interpreted labs.  The pertinent results include: No leukocytosis.  Anemia with a hemoglobin 11.7.  Platelets within range.  Troponin of 8.  BNP of 43.2.  Hyperkalemia of 5.2, decreased bicarb of 18.  Slightly worsening renal function from baseline creatinine 1.83, BUN 48, GFR 35 who seems to be uptrending over the past few months.   Imaging Studies ordered:  I ordered imaging studies including chest ray I independently visualized and interpreted imaging which showed stable enlarged cardiac silhouette and chronic central vascular congestion.  Small bilateral pleural effusion with left basilar atelectasis. I  agree with the radiologist interpretation   Cardiac Monitoring: / EKG:  The patient was maintained on a cardiac monitor.  I personally viewed and  interpreted the cardiac monitored which showed an underlying rhythm of: Sinus rhythm with first-degree AV block.  Consultations Obtained:  See ED course  Problem List / ED Course / Critical interventions / Medication management  AKI, hyperkalemia I ordered medication including Lasix Reevaluation of the patient after these medicines showed that the patient stayed the same I have reviewed the patients home medicines and have made adjustments as needed   Social Determinants of Health:  Former cigarette use.  Denies illicit drug use.   Test / Admission - Considered:  AKI, hyperkalemia Vitals signs significant for hypertension. Otherwise within normal range and stable throughout visit. Laboratory/imaging studies significant for: See above 89 year old male presents emergency department complaints of abnormal lab.States he had BNP drawn by his nursing home in the outpatient setting yesterday due to increased lower extremity swelling was elevated of 1200 prompting visit to the emergency department.  States that he does not feel pain in his chest is short of breath.  States that he has dealt with more generalized weakness over the past month or so has not necessarily worsened acutely over the past few days.  States that he feels like his legs "may be a little more swollen" but states they are swollen at baseline.  Denies any abdominal pain, nausea, vomiting, urinary symptoms, change in cough, fever, chills, night sweats.  On exam, bilateral pitting edema.  Auscultatory Rales/wheezing bilateral lung fields.  Laboratory studies concerning for mildly elevated BNP of 48.2, hyperkalemia of 5.2 as well as worsening renal function with creatinine of 1.83, BUN of 48 and GFR 35.  Although patient not currently complaining of significantly worsening respiratory  symptoms such as shortness of breath, increased cough, concern for slightly worsening CHF given lower extremity pitting edema worsened despite increased at home Lasix, bilateral pleural effusions on chest x-ray imaging.  In the setting of worsening symptoms despite increasing at home diuretic as well as uptrending renal function, admission to the hospital deemed appropriate for monitored diuresis.   Treatment plan were discussed at length with patient and they knowledge understanding was agreeable to said plan.  Appropriate consultations were made as described in the ED course.  Patient was stable upon admission to the hospital.         Final Clinical Impression(s) / ED Diagnoses Final diagnoses:  Hyperkalemia  AKI (acute kidney injury) [facility])    Rx / DC Orders ED Discharge Orders     None         Peter Garter, [place] 09/26/23 1022    Linwood Dibbles, MD 09/27/23 [phone]

## 2023-09-26 NOTE — Progress Notes (Signed)
 Heart Failure Navigator Progress Note  Assessed for Heart & Vascular TOC clinic readiness.  Patient does not meet criteria due to per MD note EF 60-65%, patient wheel chair bound with limited mobility, pivot only. Has a scheduled [facility] appointment on 11/21/23.   Navigator will sign off at this time.   Rhae Hammock, BSN, Scientist, clinical (histocompatibility and immunogenetics) Only

## 2023-09-26 NOTE — H&P (Signed)
 History and Physical    Nathan Santiago [facility] BJY:[id number] DOB: [birth year] DOA: 09/25/2023  PCP: Mahlon Gammon, MD   Patient coming from: Nursing facility  At baseline [facility] bound  Chief Complaint  Patient presents with   Abnormal Lab    HPI:89 year old male a nursing home resident, [facility] bound only able to pivot  with history of CAD-with stents in 2006, AAA, HTN, HLD, carotid artery disease, extensive PAD/renal artery stenosis left-not a candidate for left common femoral enterectomy, history of total hip surgery, lumbar fracture/chronic back pain followed by cardiology for lower leg edema, hypertension/vasculopath disease, had a recent echo on 2/14 showing normal EF no RWMA normal right ventricular function trivial mitral leakage who at the nursing facility on 09/25/23 was noticed to be more lethargic, with weakness, leg edema and lab work showed BNP more than 1200, in the cardiology advised the patient to be brought to the ED. He states he is bit sleepy but he has no complaints.  Legs are swollen, he denies any orthopnea or dyspnea shortness of breath.  He is doing well on room air. Passing flatus had bowel movement yesterday no constipation diarrhea nausea vomiting fever chills dysuria focal weakness or chest pain.  In ED: Vitals afebrile BP in 179-211, on RA not hypoxic, labs hyperkalemia 5.2, bun/creat 48/1.8 b/l bun/creat  around 1.4 in 2024 but last one as 1.6 in Jan 25, indicating CKD 3a or 3b, BNP 488.  Chest x-ray with a stable enlarged cardiac silhouette, chronic central vascular congestion and small bilateral pleural effusion and left basilar atelectasis. Patient was felt to be fluid overloaded Lasix 40 mg IV ordered and admission was requested.   Assessment/Plan Principal Problem:   Fluid overload  Leg edema Suspect CHF with preserved EF: Recently followed by cardiology had echo done unremarkable and diuretics adjusted. patient lab with BNP> 1200 at [facility] and worsening leg edema and  lethargy so brought to ED. has edematous leg, chest x-ray with vascular congestion, small bilateral pleural effusions AND appears fluid overloaded.  Ordered weight check in the ED to compare with his last weight.  Keep on gentle Lasix 20 mg iv daily. Got 40 today. 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  Creatinine elevated from baseline will monitor closely while on diuretics and chlorthalidone.  Mild Hyperkalemia: Monitor electrolytes, hopefully Lasix will help.  Discontinued home potassium  Hypertension: BP poorly controlled. PTA on amlodipine 10, chlorthalidone 50, hydralazine 25 in am 50 in PM losartan bid Lopressor 75 twice daily.  Resume all his home meds-except losartan due to AKI/hyperkalemia.  Added IV as needed hydralazine for systolic blood pressure more than 180  CAD with previous stents Carotid artery disease PAD AAA Renal artery stenosis HLD: Has had extensive workup with multiple vascular disease not a candidate for femoral endarterectomy, previously seen by vascular surgery, being monitored outpatient by cardiology currently.  Continue home Plavix, Lipitor  BPH: Continue Proscar and Flomax.  Mild anemia likely from CKD/chronic disease: He is on iron supplement will resume.  Deconditioning/debility Wheelchair-bound: Keep on fall precautions supportive care delirium precaution.  CODE STATUS: Patient is DNR based upon signed yellow DNR sheet and confirmed with the patient  There is no height or weight on file to calculate BMI.   Severity of Illness: The appropriate patient status for this patient is OBSERVATION. Observation status is judged to be reasonable and necessary in  order to provide the required intensity of service to ensure the patient's safety. The patient's presenting symptoms, physical exam findings, and initial radiographic and laboratory data in the context of their  medical condition is felt to place them at decreased risk for further clinical deterioration. Furthermore, it is anticipated that the patient will be medically stable for discharge from the hospital within 2 midnights of admission.    DVT prophylaxis: enoxaparin (LOVENOX) injection 40 mg Start: 09/26/23 1200 Code Status:   Code Status: Limited: Do not attempt resuscitation (DNR) -DNR-LIMITED -Do Not Intubate/DNI   Family Communication: Admission, patients condition and plan of care including tests being ordered have been discussed with the patient  who indicate understanding and agree with the plan and Code Status.  I called the contact listed- his spouse but no answer -left msg to all [place] back.  Consults called:  CARDIOLOGY  Review of Systems: All systems were reviewed and were negative except as mentioned in HPI above. Negative for fever Negative for chest pain Negative for shortness of breath  Past Medical History:  Diagnosis Date   AAA (abdominal aortic aneurysm) (HCC)    asymptomatic   AAA (abdominal aortic aneurysm) (HCC) 2010   peripheral  angiogram-- bilateral SFA DISEASE  and 60 to 70% infrarenaal abd. aortic stenosis with 15 -mm gradient   Adenomatous colon polyp 11/2003   CAD (coronary artery disease)    Gait abnormality 02/13/2017   Gastroparesis    pt denies   GERD (gastroesophageal reflux disease)    w/ LPR   History of kidney stones    x2   Hyperlipidemia    Hypertension    IDA (iron deficiency anemia)    Peripheral arterial disease (HCC)    RCEA  by Dr [name]   PUD (peptic ulcer disease)    pt unaware   Vertigo     Past Surgical History:  Procedure Laterality Date   ABDOMINAL AORTOGRAM W/LOWER EXTREMITY Bilateral 08/05/2018   Procedure: ABDOMINAL AORTOGRAM W/LOWER EXTREMITY;  Surgeon: Runell Gess, MD;  Location: [facility] INVASIVE CV LAB;  Service: Cardiovascular;  Laterality: Bilateral;   ABDOMINAL AORTOGRAM W/LOWER EXTREMITY N/A 09/26/2021    Procedure: ABDOMINAL AORTOGRAM W/LOWER EXTREMITY;  Surgeon: Runell Gess, MD;  Location: [facility] INVASIVE CV LAB;  Service: Cardiovascular;  Laterality: N/A;   AORTOGRAM  04/24/2016    Abdominal aortogram, bilateral iliac angiogram, bifemoral runoff   CARDIAC CATHETERIZATION  05/12/2005   RCA   carotid doppler  01/24/2013   RICA endarterectomy,left CCA 0-49%; left bulb and prox ICA 50-69%; bilaateral subclavian < 50%   CAROTID ENDARTERECTOMY  11/01/2010   CATARACT EXTRACTION     COLONOSCOPY     CORONARY ANGIOPLASTY  05/18/2005   2 STENTS distal RCA AND PROXIMAL-MID RCA   5 total stents per pt.   CYSTOSCOPY/URETEROSCOPY/HOLMIUM LASER/STENT PLACEMENT Left 01/11/2018   Procedure: LEFT URETEROSCOPY/HOLMIUM LASER/STENT PLACEMENT;  Surgeon: Crist Fat, MD;  Location: [facility] ORS;  Service: Urology;  Laterality: Left;   DOPPLER ECHOCARDIOGRAPHY  02/07/2012   EF 55%,SHOWED NO ISCHEMIA    HERNIA REPAIR     umbilical   lower arterial  doppler  02/04/2013   aotra 1.5 x 1.5 cm; distal abd aorta 70-99%,proximal common iliac arteries -very stenotic with increased velocities>50%,may be falsely elevated as a result of residual plaque from the distal aorta stenosis   lower extremity doppler  June 18 ,2013   ABI'S ABNORMAL, RABI was 0.88 and LABI 0.75 ,with 3-vessel  run off  NM MYOVIEW LTD  MAY [id number]   showed no significant ischemia;   NM MYOVIEW LTD  04/22/2008   ef 77%,exercise capcity 6 METS ,exaggerated blood pressure response to exercise   PERIPHERAL VASCULAR CATHETERIZATION N/A 04/24/2016   Procedure: Lower Extremity Angiography;  Surgeon: Runell Gess, MD;  Location: [facility] INVASIVE CV LAB;  Service: Cardiovascular;  Laterality: N/A;   PERIPHERAL VASCULAR CATHETERIZATION  04/24/2016   Procedure: Peripheral Vascular Intervention;  Surgeon: Runell Gess, MD;  Location: [facility] INVASIVE CV LAB;  Service: Cardiovascular;;  Aorta   retrograde central aortic catheterization  05/19/2005   cutting balloon  atherectomy, c-circ stenosis with DES STENTING CYPHER   TONSILLECTOMY     TOTAL HIP ARTHROPLASTY Right 07/11/2022   Procedure: TOTAL HIP ARTHROPLASTY ANTERIOR APPROACH;  Surgeon: Durene Romans, MD;  Location: [facility] ORS;  Service: Orthopedics;  Laterality: Right;   TOTAL KNEE ARTHROPLASTY Left 01/03/2019   Procedure: TOTAL KNEE ARTHROPLASTY;  Surgeon: Beverely Low, MD;  Location: [facility] OR[facility];  Service: Orthopedics;  Laterality: Left;  with IS block   TRANSURETHRAL RESECTION OF PROSTATE N/A 09/08/2016   Procedure: TRANSURETHRAL RESECTION OF THE PROSTATE (TURP);  Surgeon: Crist Fat, MD;  Location: [facility] OR[facility];  Service: Urology;  Laterality: N/A;   TRANSURETHRAL RESECTION OF PROSTATE     10-10-17  Dr. Marlou Porch   TRANSURETHRAL RESECTION OF PROSTATE N/A 10/10/2017   Procedure: TRANSURETHRAL RESECTION OF THE PROSTATE (TURP);  Surgeon: Crist Fat, MD;  Location: [facility] OR[facility];  Service: Urology;  Laterality: N/A;   VASECTOMY       reports that he quit smoking about 55 years ago. His smoking use included cigarettes. He started smoking about 75 years ago. He has a 40 pack-year smoking history. He has never used smokeless tobacco. He reports that he does not currently use alcohol after a past usage of about 4.0 standard drinks of alcohol per week. He reports that he does not use drugs.  Allergies  Allergen Reactions   Lisinopril Cough   Niaspan [Niacin Er (Antihyperlipidemic)] Rash    Family History  Problem Relation Age of Onset   Ovarian cancer Sister    Heart disease Father    Brain cancer Brother        Tumor     Prior to Admission medications   Medication Sig Start Date End Date Taking? Authorizing Provider  amLODipine (NORVASC) 10 MG tablet Take 10 mg by mouth daily. 06/07/22  Yes [provider]  Apoaequorin (PREVAGEN PO) Take 1 tablet by mouth in the morning.   Yes [provider]  atorvastatin (LIPITOR) 40 MG tablet Take 40 mg by mouth daily at 6 PM.  11/14/16  Yes  [provider]  Biotin 10 MG TABS Take 10 mg by mouth in the morning.   Yes [provider]  cetirizine (ZYRTEC) 10 MG tablet Take 1 tablet (10 mg total) by mouth at bedtime. 11/16/22  Yes Wert, Trula Ore, NP  chlorthalidone (HYGROTON) 50 MG tablet Take 50 mg by mouth in the morning. Hold if BP < 95   Yes [provider]  cholecalciferol (VITAMIN D3) 25 MCG (1000 UT) tablet Take 1,000 Units by mouth in the morning.   Yes [provider]  cloNIDine (CATAPRES) 0.1 MG tablet Take 1 tablet (0.1 mg total) by mouth 3 (three) times daily. Patient taking differently: Take 0.1 mg by mouth 3 (three) times daily. Hold if BP < 95 07/16/23  Yes Wert, Trula Ore, NP  clopidogrel (PLAVIX) 75 MG tablet Take  75 mg by mouth every evening.    Yes [provider]  finasteride (PROSCAR) 5 MG tablet Take 5 mg by mouth in the morning.   Yes [provider]  gabapentin (NEURONTIN) 100 MG capsule Take 100 mg by mouth 3 (three) times daily.   Yes [provider]  Glucosamine-Chondroitin 750-600 MG TABS Take 1 tablet by mouth 2 (two) times daily.   Yes [provider]  hydrALAZINE (APRESOLINE) 25 MG tablet Take 25 mg by mouth in the morning and at bedtime. Hold if BP < 95   Yes [provider]  hydrALAZINE (APRESOLINE) 50 MG tablet Take 50 mg by mouth every evening. Hold if BP <95   Yes [provider]  HYDROcodone bit-homatropine (HYCODAN) 5-1.5 MG/5ML syrup Take 5 mLs by mouth at bedtime as needed for cough. 08/23/23  Yes Fletcher Anon, NP  iron polysaccharides (NIFEREX) 150 MG capsule Take 150 mg by mouth every Monday, Wednesday, and Friday.   Yes [provider]  losartan (COZAAR) 50 MG tablet Take 50 mg by mouth 2 (two) times daily.   Yes [provider]  melatonin 5 MG TABS Take 1 tablet (5 mg total) by mouth at bedtime. 10/13/22  Yes Fletcher Anon, NP  metoprolol tartrate (LOPRESSOR) 50 MG tablet Take 75 mg by  mouth 2 (two) times daily.   Yes [provider]  Multiple Vitamin (MULTIVITAMIN WITH MINERALS) TABS tablet Take 1 tablet by mouth in the morning.   Yes [provider]  pantoprazole (PROTONIX) 40 MG tablet Take 40 mg by mouth 2 (two) times daily.   Yes [provider]  potassium chloride SA (K-DUR,KLOR-CON) 20 MEQ tablet Take 20 mEq by mouth every evening.  07/16/13  Yes [provider]  tamsulosin (FLOMAX) 0.4 MG CAPS capsule Take 1 capsule (0.4 mg total) by mouth daily after supper. 05/15/22  Yes Briant Cedar, MD  acetaminophen (TYLENOL) 500 MG tablet Take 1 tablet (500 mg total) by mouth 2 (two) times daily as needed. Patient not taking: Reported on 09/26/2023 01/13/19   Fletcher Anon, NP  cephALEXin (KEFLEX) 500 MG capsule Take 500 mg by mouth 3 (three) times daily. Patient not taking: Reported on 09/26/2023    [provider]  furosemide (LASIX) 20 MG tablet Take 20 mg by mouth daily as needed for edema. Patient not taking: Reported on 09/26/2023    [provider]  ondansetron (ZOFRAN) 4 MG tablet Take 4 mg by mouth every 6 (six) hours as needed for nausea or vomiting. Patient not taking: Reported on 09/26/2023    [provider]    Physical Exam: Vitals:   09/25/23 2027 09/26/23 0308 09/26/23 0624 09/26/23 0842  BP:  (!) 183/78 (!) 211/61 (!) 192/83  Pulse:  68 65 74  Resp:  14 18 18   Temp:  98 F (36.7 C) 98.7 F (37.1 C) 98.7 F (37.1 C)  TempSrc:      SpO2: 93% 93% 95% 93%    General exam: AAO, obese, pleasant, NAD, weak appearing. HEENT:Oral mucosa moist, Ear/Nose WNL grossly, dentition normal. Respiratory system: bilaterally CLEAR W/ basal crackles,no use of accessory muscle Cardiovascular system: S1 & S2 +, No JVD,. Gastrointestinal system: Abdomen soft, NT,ND, BS+ Nervous System:Alert, awake, moving arms and able to wiggle toes Extremities: b/l ankle pitting edema ++, distal peripheral pulses  palpable.  Skin: No rashes,no icterus. MSK: Normal muscle bulk,tone, power   Labs on Admission: I have personally reviewed following labs and imaging studies  CBC: Recent Labs  Lab 09/25/23 2212  WBC 7.4  NEUTROABS 5.6  HGB 11.7*  HCT 36.7*  MCV 91.5  PLT 181   Basic Metabolic Panel: Recent Labs  Lab 09/25/23 2212  NA 142  K 5.2*  CL 111  CO2 18*  GLUCOSE 126*  BUN 48*  CREATININE 1.83*  CALCIUM 9.3   Coagulation Profile: No results for input(s): "INR", "PROTIME" in the last 168 hours. Cardiac Panel (last 3 results) Recent Labs    09/25/23 2212  TROPONINIHS 6   Urine analysis:    Component Value Date/Time   COLORURINE YELLOW 05/14/2022 0846   APPEARANCEUR CLEAR 05/14/2022 0846   LABSPEC 1.019 05/14/2022 0846   PHURINE 5.0 05/14/2022 0846   GLUCOSEU NEGATIVE 05/14/2022 0846   HGBUR NEGATIVE 05/14/2022 0846   BILIRUBINUR NEGATIVE 05/14/2022 0846   KETONESUR 5 (A) 05/14/2022 0846   PROTEINUR NEGATIVE 05/14/2022 0846   UROBILINOGEN 0.2 10/28/2010 0909   NITRITE NEGATIVE 05/14/2022 0846   LEUKOCYTESUR NEGATIVE 05/14/2022 0846    Radiological Exams on Admission: DG Chest 2 View Result Date: 09/25/2023 CLINICAL DATA:  Short of breath, lower extremity swelling EXAM: CHEST - 2 VIEW COMPARISON:  03/01/2023 FINDINGS: Frontal and lateral views of the chest demonstrate a stable cardiac silhouette. There is chronic central pulmonary vascular congestion. There are small bilateral pleural effusions. Minimal consolidation at the left lung base most consistent with atelectasis. No pneumothorax. No acute bony abnormalities. IMPRESSION: 1. Stable enlarged cardiac silhouette and chronic central vascular congestion. 2. Small bilateral pleural effusions, with left basilar atelectasis. Electronically Signed   By: Sharlet Salina M.D.   On: 09/25/2023 22:21     Lanae Boast MD [facility]  If 7PM-7AM, please contact night-coverage [email or web address]  09/26/2023, 11:02 AM

## 2023-09-26 NOTE — Hospital Course (Addendum)
 89 year old male a nursing home resident, [facility] bound only able to pivot  with history of CAD-with stents in 2006, AAA, HTN, HLD, carotid artery disease, extensive PAD/renal artery stenosis left-not a candidate for left common femoral enterectomy, history of total hip surgery, lumbar fracture/chronic back pain followed by cardiology for lower leg edema, hypertension/vasculopath disease, had a recent echo on 2/14 showing normal EF no RWMA normal right ventricular function trivial mitral leakage who at the nursing facility on 09/25/23 was noticed to be more lethargic, with weakness, leg edema and lab work showed BNP more than 1200, in the cardiology advised the patient to be brought to the ED. He states he is bit sleepy but he has no complaints.  Legs are swollen, he denies any orthopnea or dyspnea shortness of breath.  He is doing well on room air. Passing flatus had bowel movement yesterday no constipation diarrhea nausea vomiting fever chills dysuria focal weakness or chest pain.  In ED: Vitals afebrile BP in 179-211, on RA not hypoxic, labs hyperkalemia 5.2, bun/creat 48/1.8 b/l bun/creat  around 1.4 in 2024 but last one as 1.6 in Jan 25, indicating CKD 3a or 3b, BNP 488.  Chest x-ray with a stable enlarged cardiac silhouette, chronic central vascular congestion and small bilateral pleural effusion and left basilar atelectasis. Patient was felt to be fluid overloaded Lasix 40 mg IV ordered and admission was requested.

## 2023-09-27 DIAGNOSIS — E876 Hypokalemia: Secondary | ICD-10-CM | POA: Diagnosis present

## 2023-09-27 DIAGNOSIS — I714 Abdominal aortic aneurysm, without rupture, unspecified: Secondary | ICD-10-CM | POA: Diagnosis present

## 2023-09-27 DIAGNOSIS — Z743 Need for continuous supervision: Secondary | ICD-10-CM | POA: Diagnosis not present

## 2023-09-27 DIAGNOSIS — R404 Transient alteration of awareness: Secondary | ICD-10-CM | POA: Diagnosis not present

## 2023-09-27 DIAGNOSIS — N179 Acute kidney failure, unspecified: Secondary | ICD-10-CM | POA: Diagnosis not present

## 2023-09-27 DIAGNOSIS — I739 Peripheral vascular disease, unspecified: Secondary | ICD-10-CM | POA: Diagnosis not present

## 2023-09-27 DIAGNOSIS — B965 Pseudomonas (aeruginosa) (mallei) (pseudomallei) as the cause of diseases classified elsewhere: Secondary | ICD-10-CM | POA: Diagnosis not present

## 2023-09-27 DIAGNOSIS — I1A0 Resistant hypertension: Secondary | ICD-10-CM | POA: Diagnosis not present

## 2023-09-27 DIAGNOSIS — I130 Hypertensive heart and chronic kidney disease with heart failure and stage 1 through stage 4 chronic kidney disease, or unspecified chronic kidney disease: Secondary | ICD-10-CM | POA: Diagnosis not present

## 2023-09-27 DIAGNOSIS — D631 Anemia in chronic kidney disease: Secondary | ICD-10-CM | POA: Diagnosis not present

## 2023-09-27 DIAGNOSIS — I5033 Acute on chronic diastolic (congestive) heart failure: Secondary | ICD-10-CM | POA: Diagnosis not present

## 2023-09-27 DIAGNOSIS — I5031 Acute diastolic (congestive) heart failure: Secondary | ICD-10-CM | POA: Diagnosis not present

## 2023-09-27 DIAGNOSIS — J9811 Atelectasis: Secondary | ICD-10-CM | POA: Diagnosis present

## 2023-09-27 DIAGNOSIS — G9340 Encephalopathy, unspecified: Secondary | ICD-10-CM

## 2023-09-27 DIAGNOSIS — G9341 Metabolic encephalopathy: Secondary | ICD-10-CM | POA: Diagnosis not present

## 2023-09-27 DIAGNOSIS — D509 Iron deficiency anemia, unspecified: Secondary | ICD-10-CM | POA: Diagnosis present

## 2023-09-27 DIAGNOSIS — E8779 Other fluid overload: Secondary | ICD-10-CM | POA: Diagnosis not present

## 2023-09-27 DIAGNOSIS — R6889 Other general symptoms and signs: Secondary | ICD-10-CM | POA: Diagnosis not present

## 2023-09-27 DIAGNOSIS — N390 Urinary tract infection, site not specified: Secondary | ICD-10-CM | POA: Diagnosis not present

## 2023-09-27 DIAGNOSIS — I872 Venous insufficiency (chronic) (peripheral): Secondary | ICD-10-CM | POA: Diagnosis present

## 2023-09-27 DIAGNOSIS — I480 Paroxysmal atrial fibrillation: Secondary | ICD-10-CM | POA: Diagnosis not present

## 2023-09-27 DIAGNOSIS — E875 Hyperkalemia: Secondary | ICD-10-CM | POA: Diagnosis present

## 2023-09-27 DIAGNOSIS — E669 Obesity, unspecified: Secondary | ICD-10-CM | POA: Diagnosis present

## 2023-09-27 DIAGNOSIS — R918 Other nonspecific abnormal finding of lung field: Secondary | ICD-10-CM | POA: Diagnosis not present

## 2023-09-27 DIAGNOSIS — I350 Nonrheumatic aortic (valve) stenosis: Secondary | ICD-10-CM | POA: Diagnosis not present

## 2023-09-27 DIAGNOSIS — I700 Atherosclerosis of aorta: Secondary | ICD-10-CM | POA: Diagnosis not present

## 2023-09-27 DIAGNOSIS — R059 Cough, unspecified: Secondary | ICD-10-CM | POA: Diagnosis not present

## 2023-09-27 DIAGNOSIS — Z7401 Bed confinement status: Secondary | ICD-10-CM | POA: Diagnosis not present

## 2023-09-27 DIAGNOSIS — N1832 Chronic kidney disease, stage 3b: Secondary | ICD-10-CM | POA: Diagnosis not present

## 2023-09-27 DIAGNOSIS — R531 Weakness: Secondary | ICD-10-CM | POA: Diagnosis not present

## 2023-09-27 DIAGNOSIS — I10 Essential (primary) hypertension: Secondary | ICD-10-CM | POA: Diagnosis not present

## 2023-09-27 DIAGNOSIS — G8929 Other chronic pain: Secondary | ICD-10-CM | POA: Diagnosis present

## 2023-09-27 DIAGNOSIS — I701 Atherosclerosis of renal artery: Secondary | ICD-10-CM | POA: Diagnosis not present

## 2023-09-27 DIAGNOSIS — I878 Other specified disorders of veins: Secondary | ICD-10-CM | POA: Diagnosis present

## 2023-09-27 DIAGNOSIS — E785 Hyperlipidemia, unspecified: Secondary | ICD-10-CM | POA: Diagnosis present

## 2023-09-27 DIAGNOSIS — I4891 Unspecified atrial fibrillation: Secondary | ICD-10-CM | POA: Diagnosis not present

## 2023-09-27 DIAGNOSIS — I70201 Unspecified atherosclerosis of native arteries of extremities, right leg: Secondary | ICD-10-CM | POA: Diagnosis present

## 2023-09-27 DIAGNOSIS — Z1152 Encounter for screening for COVID-19: Secondary | ICD-10-CM | POA: Diagnosis not present

## 2023-09-27 DIAGNOSIS — I2510 Atherosclerotic heart disease of native coronary artery without angina pectoris: Secondary | ICD-10-CM | POA: Diagnosis not present

## 2023-09-27 DIAGNOSIS — Z66 Do not resuscitate: Secondary | ICD-10-CM | POA: Diagnosis not present

## 2023-09-27 LAB — CBC
HCT: 31 % — ABNORMAL LOW (ref 39.0–52.0)
Hemoglobin: 10.3 g/dL — ABNORMAL LOW (ref 13.0–17.0)
MCH: 29.3 pg (ref 26.0–34.0)
MCHC: 33.2 g/dL (ref 30.0–36.0)
MCV: 88.3 fL (ref 80.0–100.0)
Platelets: 187 10*3/uL (ref 150–400)
RBC: 3.51 MIL/uL — ABNORMAL LOW (ref 4.22–5.81)
RDW: 12.9 % (ref 11.5–15.5)
WBC: 9.3 10*3/uL (ref 4.0–10.5)
nRBC: 0 % (ref 0.0–0.2)

## 2023-09-27 LAB — BASIC METABOLIC PANEL
Anion gap: 10 (ref 5–15)
BUN: 46 mg/dL — ABNORMAL HIGH (ref 8–23)
CO2: 23 mmol/L (ref 22–32)
Calcium: 9 mg/dL (ref 8.9–10.3)
Chloride: 107 mmol/L (ref 98–111)
Creatinine, Ser: 1.79 mg/dL — ABNORMAL HIGH (ref 0.61–1.24)
GFR, Estimated: 36 mL/min — ABNORMAL LOW (ref >60)
Glucose, Bld: 89 mg/dL (ref 70–99)
Potassium: 4.6 mmol/L (ref 3.5–5.1)
Sodium: 140 mmol/L (ref 135–145)

## 2023-09-27 LAB — URINALYSIS, ROUTINE W REFLEX MICROSCOPIC
Bilirubin Urine: NEGATIVE
Glucose, UA: NEGATIVE mg/dL
Hgb urine dipstick: NEGATIVE
Ketones, ur: NEGATIVE mg/dL
Nitrite: NEGATIVE
Protein, ur: NEGATIVE mg/dL
Specific Gravity, Urine: 1.006 (ref 1.005–1.030)
WBC, UA: >50 WBC/hpf (ref 0–5)
pH: 5 (ref 5.0–8.0)

## 2023-09-27 LAB — RESP PANEL BY RT-PCR (RSV, FLU A&B, COVID)  RVPGX2
Influenza A by PCR: NEGATIVE
Influenza B by PCR: NEGATIVE
Resp Syncytial Virus by PCR: NEGATIVE
SARS Coronavirus 2 by RT PCR: NEGATIVE

## 2023-09-27 MED ORDER — FUROSEMIDE 10 MG/ML IJ SOLN
60.0000 mg | Freq: Once | INTRAMUSCULAR | Status: AC
  Administered 2023-09-27: 60 mg via INTRAVENOUS
  Filled 2023-09-27: qty 6

## 2023-09-27 MED ORDER — SODIUM CHLORIDE 0.9 % IV SOLN
1.0000 g | INTRAVENOUS | Status: DC
  Administered 2023-09-27 – 2023-09-28 (×2): 1 g via INTRAVENOUS
  Filled 2023-09-27 (×2): qty 10

## 2023-09-27 NOTE — TOC Progression Note (Signed)
 Transition of Care [facility]) - Progression Note    Patient Details  Name: Nathan Santiago MRN: [id number] Date of Birth: 1931-01-07  [facility] CM/SW Contact  Michaela Corner, [place] Phone Number: 09/27/2023, 2:51 PM  Clinical Narrative:   CSW spoke with [facility] about pt discharging back. Per [facility], patient has a STR room and is able to discharge back when medically ready.   TOC will continue to follow.   Expected Discharge Plan: Skilled Nursing Facility Barriers to Discharge: Continued Medical Work up  Expected Discharge Plan and Services   Discharge Planning Services: CM Consult Post Acute Care Choice: Skilled Nursing Facility Living arrangements for the past 2 months: Skilled Nursing Facility ([facility])                 DME Arranged: N/A DME Agency: NA       HH Arranged: NA           Social Determinants of Health (SDOH) Interventions SDOH Screenings   Food Insecurity: No Food Insecurity (09/26/2023)  Housing: Low Risk  (09/26/2023)  Transportation Needs: No Transportation Needs (09/26/2023)  Utilities: Not At Risk (09/26/2023)  Alcohol Screen: Low Risk  (11/28/2022)  Depression (PHQ2-9): Low Risk  (07/13/2023)  Financial Resource Strain: Low Risk  (11/28/2022)  Physical Activity: Insufficiently Active (11/28/2022)  Social Connections: Socially Integrated (09/26/2023)  Stress: No Stress Concern Present (11/28/2022)  Tobacco Use: Medium Risk (09/26/2023)    Readmission Risk Interventions    07/10/2022    2:59 PM  Readmission Risk Prevention Plan  Post Dischage Appt Complete  Medication Screening Complete  Transportation Screening Complete

## 2023-09-27 NOTE — Plan of Care (Signed)
  Problem: Clinical Measurements: Goal: Will remain free from infection Outcome: Progressing Goal: Respiratory complications will improve Outcome: Progressing Goal: Cardiovascular complication will be avoided Outcome: Progressing   Problem: Elimination: Goal: Will not experience complications related to bowel motility Outcome: Progressing   Problem: Safety: Goal: Ability to remain free from injury will improve Outcome: Progressing   Problem: Respiratory: Goal: Respiratory symptoms related to disease process will be avoided Outcome: Progressing   Problem: Cardiac: Goal: Ability to achieve and maintain adequate cardiopulmonary perfusion will improve Outcome: Progressing

## 2023-09-27 NOTE — Progress Notes (Addendum)
 Patient Name: Nathan Santiago [facility] Date of Encounter: 09/27/2023 [facility] Cardiologist: Nanetta Batty, MD   Interval Summary  .    Patient resting comfortably in the bed. Pleasantly confused, unable to truly answer most questions, repeating most of what I asked back to me.  He is currently on 2 L oxygen via [place] He denies feeling poorly and states he has peeing a lot since admission  Vital Signs .    Vitals:   09/27/23 0627 09/27/23 0727 09/27/23 0728 09/27/23 0745  BP: (!) 154/41 (!) 134/40  (!) 134/40  Pulse:  65  (!) 59  Resp:  19    Temp:  99.2 F (37.3 C)  98.8 F (37.1 C)  TempSrc:  Oral  Oral  SpO2:  (!) 87% 94% 95%  Weight:      Height:       Intake/Output Summary (Last 24 hours) at 09/27/2023 0944 Last data filed at 09/27/2023 1610 Gross per 24 hour  Intake 160 ml  Output 1175 ml  Net -1015 ml      09/27/2023    4:10 AM 09/26/2023    3:39 PM 08/27/2023    9:58 AM  Last 3 Weights  Weight (lbs) 181 lb 14.1 oz 184 lb 11.9 oz 182 lb 12.8 oz  Weight (kg) 82.5 kg 83.8 kg 82.918 kg     Telemetry/ECG    Sinus rhythm with HR 70s - Personally Reviewed  Physical Exam .   GEN: No acute distress, pleasantly confused, currently on 2 L oxygen via [place]: unable to assess JVD Cardiac: RRR, no murmurs, rubs, or gallops.  Respiratory: diminished breath sounds at bilateral bases, crackles on left. MS: No notable LE edema, dressings present for wound care   Assessment & Plan .     Acute on chronic HFpEF Did not have robust response to IV Lasix 40 mg x 1 Then given IV Lasix 80 mg x 1  Weight down to 181 lb from 184 lb Net - 1 L since admission  Creatinine 1.79 today, down from 1.83 BNP 488 CXR showed enlarged cardiac silhouette and chronic central vascular congestion with small bilateral pleural effusions  Echo from 09/14/23 showed: EF 60-65%, mild LVH, grade I diastolic dysfunction, trivial MR Patient states he does not have any symptoms, but also often did  not answer my questions and instead repeated them back to me -- no other family members present in the room Scheduled to get IV Lasix 60 mg x 1 this AM -- reassess response will hold  on IV diureses in AM & consider converting to PO diuretic Continue CoReg 12.5 mg BID Continue other antihypertensives, held chlorthalidone for tomorrow in setting of AKI  Would ideally want to re-start home ARB when possible in regards to renal function    CAD s/p remote stenting 2006 PAD Stable disease, asymptomatic, currently wheelchair bound  Peripheral angiography 2023 showed significant LE disease, referred to Dr. Edilia Bo, determined he was not a candidate given age/co-morbidities  Continue Lipitor 40 mg daily Continue Plavix 75 mg daily  Hypertension Renal artery stenosis  History of 90% ostial right left RAS Most recent BP 134/40 Currently on amlodipine 10 mg daily, CoReg 12.5 mg BID, chlorthalidone 50 mg daily, clonidine 0.1 mg TID, hydralazine 50 mg TID, Isordil 10 mg TID Held chlorthalidone for tomorrow in setting of AKI  Home ARB held due to AKI -- would like to restart when possible   Per primary AKI on CKD  Hyperkalemia  Mild anemia BPH  For questions or updates, please contact [facility] Please consult [email or web address] for contact info under        Signed, Olena Leatherwood, PA-C   ATTENDING ATTESTATION  I have seen, examined and evaluated the patient this AM on rounds along with Olena Leatherwood, PA-C .  After reviewing all the available data and chart, we discussed the patients laboratory, study & physical findings as well as symptoms in detail.  I agree with her findings, examination as well as impression recommendations as per our discussion.    Attending adjustments noted in italics.   Pt was febrile today -currently on airborne precautions.  Very confused - mumbling. -likely encephalopathic.  Appears close to euvolemic -- consider changing to PO diuretic in AM BP  still high - but in setting of ~mild AKI & encephalopathy - would be gentle with up-titration of antihypertensives.  Will follow along.     Marykay Lex, MD, MS Bryan Lemma, M.D., M.S. Interventional Cardiologist  [facility]  Pager # [phone] Phone # [phone] [address] [place], [place] [address]

## 2023-09-27 NOTE — Progress Notes (Signed)
 [facility] PROGRESS NOTE  Nathan Santiago (DOB: [birth year]) ZOX:[id number] PCP: Mahlon Gammon, MD  Brief Narrative: Nathan Santiago is a 89 y.o. male with a history of CAD s/p stents 2006, AAA, renal artery stenosis, extensive PVD, HTN, HLD, chronic back pain who presented to the ED on 09/25/2023 from nursing facility due to lethargy. He was noted to be volume overloaded on exam with elevated BNP, and CXR with cardiomegaly, central vascular congestion, and pleural effusion. Lasix was administered, cardiology consulted, and patient admitted for suspected acute on chronic HFpEF complicated by AKI. He has exhibited delirium and on 2/27 developed fever to 101F.   Subjective: When asked his name, he sings "row, row, row your boat." Not able to truly answer questions, but within that constraint, does not have complaints.   Objective: BP (!) 145/45 (BP Location: Right Arm)   Pulse 69   Temp (!) 101 F (38.3 C) (Oral)   Resp 20   Ht 5\' 4"  (1.626 m)   Wt 82.5 kg   SpO2 93%   BMI 31.22 kg/m   Gen: Elderly male in no distress Pulm: Diminished but clear, nonlabored tachypnea on supplemental oxygen CV: RRR, no MRG, +LE edema GI: Soft, NT, ND, +BS  Neuro: Alert and disoriented, MAE, follows some but not all commands. Ext: Warm, dry, stasis dermatitis LE's Skin: No exudative wounds noted on visualized skin   Assessment & Plan: Lethargy, acute metabolic encephalopathy, acute delirium:  - Suspect hospital delirium, though ?if infection is contributing. See work up below for this.  - Delirium precautions, discussed with RN.  Fever: WBC normal. No consolidation on admission CXR. Alternative source could be cellulitis, though not overtly infected at this time. - Check blood cultures - Check covid, RSV, flu swab - Check urinalysis (pt unable to accurately report urinary symptoms)  - Empiric precautions instituted while awaiting results.   Acute on chronic HFpEF, HTN: Appears to be  volume overloaded. His weight is below outpatient clinic weight, however.  - Appreciate cardiology recommendations, dosing lasix IV, monitor UOP and weights and clinical exam.  - Holding ARB w/AKI/hyperkalemia.  - Continue norvasc 10mg , changed hydralazine to essentially bidil, change metoprolol to coreg. Continuing clonidine for now.   AKI on stage IIIb CKD:  - Hold thiazide diuretic while giving loop diuretic.  - GDMT deferred to cardiology - With history of BPH, check bladder ultrasound/post void residual and continue tamsulosin/finasteride.    PVD, CAD s/p stents: Not a candidate for common femoral endarterectomy per vascular surgery. Also not planning on intervention for renal artery stenosis.  - Continue plavix, statin  Deconditioning, debility:  - PT/OT, CSW consulted.  Iron deficiency anemia/anemia CKD:  - Continue Fe supplement.  DNR: POA.   Tyrone Nine, MD [facility] [email or web address] 09/27/2023, 2:23 PM

## 2023-09-27 NOTE — TOC Initial Note (Signed)
 Transition of Care [facility]) - Initial/Assessment Note    Patient Details  Name: Nathan Santiago MRN: [id number] Date of Birth: 04-14-1931  [facility]) CM/SW Contact:    Leone Haven, RN Phone Number: 09/27/2023, 2:43 PM  Clinical Narrative:                 From [facility] , SNF , has PCP and insurance on file, states has no HH services in place at this time, has w/chair at home.  Wife states he will need ambulance transport home at dc and she is support system, states gets medications from [facility].  [facility] SNF Resident.   Expected Discharge Plan: Skilled Nursing Facility Barriers to Discharge: Continued Medical Work up   Patient Goals and CMS Choice Patient states their goals for this hospitalization and ongoing recovery are:: return to [facility] SN[facility]          Expected Discharge Plan and Services   Discharge Planning Services: CM Consult Post Acute Care Choice: Skilled Nursing Facility Living arrangements for the past 2 months: Skilled Nursing Facility ([facility])                 DME Arranged: N/A DME Agency: NA       HH Arranged: NA          Prior Living Arrangements/Services Living arrangements for the past 2 months: Skilled Nursing Facility ([facility]) Lives with:: Facility Resident Patient language and need for interpreter reviewed:: Yes Do you feel safe going back to the place where you live?: Yes      Need for Family Participation in Patient Care: Yes (Comment) Care giver support system in place?: Yes (comment) Current home services: DME (w/chair) Criminal Activity/Legal Involvement Pertinent to Current Situation/Hospitalization: No - Comment as needed  Activities of Daily Living   ADL Screening (condition at time of admission) Independently performs ADLs?: No Does the patient have a NEW difficulty with bathing/dressing/toileting/self-feeding that is expected to last >3 days?: Yes (Initiates electronic  notice to provider for possible OT consult) Does the patient have a NEW difficulty with getting in/out of bed, walking, or climbing stairs that is expected to last >3 days?: Yes (Initiates electronic notice to provider for possible PT consult) Does the patient have a NEW difficulty with communication that is expected to last >3 days?: No Is the patient deaf or have difficulty hearing?: No Does the patient have difficulty seeing, even when wearing glasses/contacts?: No Does the patient have difficulty concentrating, remembering, or making decisions?: No  Permission Sought/Granted Permission sought to share information with : Case Manager Permission granted to share information with : Yes, Verbal Permission Granted  Share Information with NAME: wife  Permission granted to share info w AGENCY: Wife        Emotional Assessment Appearance:: Appears stated age Attitude/Demeanor/Rapport:  (appropriate) Affect (typically observed): Appropriate   Alcohol / Substance Use: Not Applicable Psych Involvement: No (comment)  Admission diagnosis:  Hyperkalemia [E87.5] AKI (acute kidney injury) (HCC) [N17.9] Fluid overload [E87.70] Patient Active Problem List   Diagnosis Date Noted   Fluid overload 09/26/2023   Memory loss 11/16/2022   Hyperlipidemia 07/28/2022   Chronic ischemic heart disease 07/28/2022   Essential (primary) hypertension 07/28/2022   Sensorineural hearing loss, bilateral 07/28/2022   Iron deficiency anemia 07/14/2022   Delirium 07/11/2022   Intertrochanteric fracture of right femur (HCC) 07/09/2022   Neuropathy 05/13/2022   Orthostatic lightheadedness 05/16/2019   Status post total knee  replacement, left 01/03/2019   Chronic cough 04/15/2018   Foreign body in ear 12/21/2017   Hematuria 10/10/2017   Gait abnormality 02/13/2017   Vertigo 11/22/2016   Hypokalemia 11/22/2016   Urinary frequency 09/08/2016   Claudication (HCC) 04/24/2016   Sensorineural hearing loss (SNHL),  bilateral 03/09/2016   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   Peripheral arterial disease (HCC) 08/06/2013   GASTROPARESIS 02/25/2009   History of colonic polyps 02/22/2009   Essential hypertension 09/26/2007   History of CAD (coronary artery disease) 09/26/2007   Allergic rhinitis 09/26/2007   GERD 09/26/2007   Peptic ulcer 09/26/2007   UMBILICAL HERNIA 09/26/2007   NEPHROLITHIASIS 09/26/2007   PCP:  Mahlon Gammon, MD Pharmacy:   [facility] - [place], [place] - [address] [place] [address] Phone: [phone] Fax: [phone]     Social Drivers of Health (SDOH) Social History: SDOH Screenings   Food Insecurity: No Food Insecurity (09/26/2023)  Housing: Low Risk  (09/26/2023)  Transportation Needs: No Transportation Needs (09/26/2023)  Utilities: Not At Risk (09/26/2023)  Alcohol Screen: Low Risk  (11/28/2022)  Depression (PHQ2-9): Low Risk  (07/13/2023)  Financial Resource Strain: Low Risk  (11/28/2022)  Physical Activity: Insufficiently Active (11/28/2022)  Social Connections: Socially Integrated (09/26/2023)  Stress: No Stress Concern Present (11/28/2022)  Tobacco Use: Medium Risk (09/26/2023)   SDOH Interventions:     Readmission Risk Interventions    07/10/2022    2:59 PM  Readmission Risk Prevention Plan  Post Dischage Appt Complete  Medication Screening Complete  Transportation Screening Complete

## 2023-09-27 NOTE — Evaluation (Signed)
 Occupational Therapy Evaluation Patient Details Name: Nathan Santiago MRN: [id number] DOB: 09-May-1931 Today's Date: 09/27/2023   History of Present Illness   Pt is a 89 y/o male presenting with lethargy, weakness and lower extremity edema. Pt admitted for acute CHF exacerbation, AKI and hyperkalemia.  PMH significant of HTN, HLD, CAD, PAD     Clinical Impressions Pt admitted from SNF, typically able to pivot to/from wheelchair at baseline though unsure of typical ADL abilities due to current AMS. Today, pt limited by lethargy and cognitive deficits. Pt answering yes/no questions but noted difficulty with initiation and motor planning, requiring overall Total A for rolling and ADLs bed level at this time. Will follow acutely to improve independence and safety with ADLs. Recommend discharge back to rehab facility when medically stable w/ therapy follow up as needed.      If plan is discharge home, recommend the following:   Two people to help with walking and/or transfers;A lot of help with walking and/or transfers;A lot of help with bathing/dressing/bathroom;Two people to help with bathing/dressing/bathroom;Assistance with feeding     Functional Status Assessment   Patient has had a recent decline in their functional status and demonstrates the ability to make significant improvements in function in a reasonable and predictable amount of time.     Equipment Recommendations   None recommended by OT     Recommendations for Other Services         Precautions/Restrictions   Precautions Precautions: Fall;Other (comment) Precaution/Restrictions Comments: monitor O2 Restrictions Weight Bearing Restrictions Per Provider Order: No     Mobility Bed Mobility Overal bed mobility: Needs Assistance Bed Mobility: Rolling Rolling: Total assist         General bed mobility comments: Total A for rolling bed    Transfers                   General transfer comment:  deferred due to lethargy      Balance                                           ADL either performed or assessed with clinical judgement   ADL Overall ADL's : Needs assistance/impaired Eating/Feeding: Total assistance;Bed level Eating/Feeding Details (indicate cue type and reason): hand over hand assist to hold cup of tea and bring to mouth; pt w/ coughing noted when drinking tea. RN present, observed and planned to place SLP consult Grooming: Total assistance;Bed level;Wash/dry face Grooming Details (indicate cue type and reason): reported "yes" when asked if he wanted to wash face but required max hand over hand cues to complete Upper Body Bathing: Total assistance;Bed level   Lower Body Bathing: Total assistance;+2 for safety/equipment;+2 for physical assistance;Bed level   Upper Body Dressing : Total assistance;Bed level   Lower Body Dressing: Total assistance;+2 for physical assistance;+2 for safety/equipment;Bed level       Toileting- Clothing Manipulation and Hygiene: Total assistance;+2 for physical assistance;+2 for safety/equipment;Bed level               Vision Baseline Vision/History: 1 Wears glasses Ability to See in Adequate Light:  (to be further assessed) Patient Visual Report: Other (comment) (to be assessed) Vision Assessment?: No apparent visual deficits Additional Comments: appears WFL when pt opened eyes though unable to formally assess due to impaired cognition     Perception  Praxis         Pertinent Vitals/Pain Pain Assessment Pain Assessment: Faces Faces Pain Scale: No hurt     Extremity/Trunk Assessment Upper Extremity Assessment Upper Extremity Assessment: Difficult to assess due to impaired cognition   Lower Extremity Assessment Lower Extremity Assessment: Defer to PT evaluation       Communication Communication Communication: Impaired Factors Affecting Communication: Reduced clarity of speech    Cognition Arousal: Lethargic Behavior During Therapy: Flat affect Cognition: Cognition impaired   Orientation impairments: Time, Place, Situation Awareness: Intellectual awareness impaired, Online awareness impaired Memory impairment (select all impairments): Short-term memory, Working [employer] fast streamer, Non-declarative long-term memory, Geneticist, molecular long-term memory Attention impairment (select first level of impairment): Focused attention Executive functioning impairment (select all impairments): Initiation, Organization, Sequencing, Reasoning, Problem solving OT - Cognition Comments: lethargic. per nursing, some issues with hospital delirium. inconsistent command following, hx of repeating phrases but able to answer  yes or no to certain questions. impaired motor planning                 Following commands: Impaired Following commands impaired: Follows one step commands inconsistently     Cueing  General Comments   Cueing Techniques: Verbal cues;Gestural cues;Tactile cues;Visual cues      Exercises     Shoulder Instructions      Home Living Family/patient expects to be discharged to:: Skilled nursing facility                                 Additional Comments: From [facility] per chart      Prior Functioning/Environment Prior Level of Function : Needs assist;Patient poor historian/Family not available             Mobility Comments: per nursing and chart, pt typically able to stand pivot to/from wheelchair ADLs Comments: Assume assist needed for ADLs though unsure how much assist    OT Problem List: Decreased activity tolerance;Decreased cognition;Decreased safety awareness;Cardiopulmonary status limiting activity   OT Treatment/Interventions: Self-care/ADL training;Therapeutic exercise;Energy conservation;DME and/or AE instruction;Therapeutic activities;Patient/family education;Balance training      OT Goals(Current goals can be found in the care  plan section)   Acute Rehab OT Goals Patient Stated Goal: none stated OT Goal Formulation: Patient unable to participate in goal setting Time For Goal Achievement: 10/11/23 Potential to Achieve Goals: Fair ADL Goals Pt Will Perform Eating: with min assist;sitting Pt Will Perform Grooming: with min assist;sitting Pt Will Transfer to Toilet: with mod assist;with +2 assist;stand pivot transfer;bedside commode Additional ADL Goal #1: Pt to complete bed mobility to sit EOB with Mod A in prep for EOB/OOB ADLs   OT Frequency:  Min 1X/week    Co-evaluation              AM-PAC OT "6 Clicks" Daily Activity     Outcome Measure Help from another person eating meals?: Total Help from another person taking care of personal grooming?: Total Help from another person toileting, which includes using toliet, bedpan, or urinal?: Total Help from another person bathing (including washing, rinsing, drying)?: Total Help from another person to put on and taking off regular upper body clothing?: Total Help from another person to put on and taking off regular lower body clothing?: Total 6 Click Score: 6   End of Session Equipment Utilized During Treatment: Oxygen Nurse Communication: Mobility status  Activity Tolerance: Patient limited by lethargy Patient left: in bed;with call bell/phone within reach;with bed alarm  set  OT Visit Diagnosis: Other abnormalities of gait and mobility (R26.89);Other symptoms and signs involving cognitive function                Time: 1250-1304 OT Time Calculation (min): 14 min Charges:  OT General Charges $OT Visit: 1 Visit OT Evaluation $OT Eval Moderate Complexity: 1 Mod  Bradd Canary, OTR/L Acute Rehab Services Office: [phone]   Lorre Munroe 09/27/2023, 1:24 PM

## 2023-09-28 ENCOUNTER — Inpatient Hospital Stay (HOSPITAL_COMMUNITY): Payer: Medicare Other

## 2023-09-28 DIAGNOSIS — I4891 Unspecified atrial fibrillation: Secondary | ICD-10-CM

## 2023-09-28 DIAGNOSIS — E8779 Other fluid overload: Secondary | ICD-10-CM | POA: Diagnosis not present

## 2023-09-28 DIAGNOSIS — N179 Acute kidney failure, unspecified: Secondary | ICD-10-CM | POA: Diagnosis not present

## 2023-09-28 DIAGNOSIS — G9340 Encephalopathy, unspecified: Secondary | ICD-10-CM | POA: Diagnosis not present

## 2023-09-28 DIAGNOSIS — N390 Urinary tract infection, site not specified: Secondary | ICD-10-CM

## 2023-09-28 DIAGNOSIS — I5033 Acute on chronic diastolic (congestive) heart failure: Secondary | ICD-10-CM | POA: Diagnosis not present

## 2023-09-28 LAB — BASIC METABOLIC PANEL
Anion gap: 10 (ref 5–15)
BUN: 49 mg/dL — ABNORMAL HIGH (ref 8–23)
CO2: 26 mmol/L (ref 22–32)
Calcium: 8.7 mg/dL — ABNORMAL LOW (ref 8.9–10.3)
Chloride: 106 mmol/L (ref 98–111)
Creatinine, Ser: 1.99 mg/dL — ABNORMAL HIGH (ref 0.61–1.24)
GFR, Estimated: 31 mL/min — ABNORMAL LOW (ref >60)
Glucose, Bld: 120 mg/dL — ABNORMAL HIGH (ref 70–99)
Potassium: 3.7 mmol/L (ref 3.5–5.1)
Sodium: 142 mmol/L (ref 135–145)

## 2023-09-28 LAB — MAGNESIUM: Magnesium: 2.1 mg/dL (ref 1.7–2.4)

## 2023-09-28 LAB — TROPONIN I (HIGH SENSITIVITY): Troponin I (High Sensitivity): 20 ng/L — ABNORMAL HIGH (ref <18)

## 2023-09-28 MED ORDER — POTASSIUM CHLORIDE CRYS ER 20 MEQ PO TBCR
30.0000 meq | EXTENDED_RELEASE_TABLET | Freq: Once | ORAL | Status: AC
  Administered 2023-09-28: 30 meq via ORAL
  Filled 2023-09-28: qty 1

## 2023-09-28 MED ORDER — AMIODARONE HCL IN DEXTROSE 360-4.14 MG/200ML-% IV SOLN
60.0000 mg/h | INTRAVENOUS | Status: DC
  Administered 2023-09-28: 60 mg/h via INTRAVENOUS
  Filled 2023-09-28: qty 200

## 2023-09-28 MED ORDER — AMIODARONE HCL IN DEXTROSE 360-4.14 MG/200ML-% IV SOLN: 30.0000 mg/h | INTRAVENOUS | Status: DC

## 2023-09-28 MED ORDER — METOPROLOL TARTRATE 25 MG PO TABS
25.0000 mg | ORAL_TABLET | Freq: Two times a day (BID) | ORAL | Status: DC
Start: 2023-09-28 — End: 2023-09-28

## 2023-09-28 MED ORDER — METOPROLOL TARTRATE 5 MG/5ML IV SOLN
2.5000 mg | INTRAVENOUS | Status: DC | PRN
  Administered 2023-09-28: 2.5 mg via INTRAVENOUS
  Filled 2023-09-28: qty 5

## 2023-09-28 MED ORDER — HEPARIN BOLUS VIA INFUSION
4000.0000 [IU] | Freq: Once | INTRAVENOUS | Status: DC
  Filled 2023-09-28: qty 4000

## 2023-09-28 MED ORDER — CARVEDILOL 12.5 MG PO TABS
12.5000 mg | ORAL_TABLET | Freq: Two times a day (BID) | ORAL | Status: DC
  Administered 2023-09-28 – 2023-10-01 (×7): 12.5 mg via ORAL
  Filled 2023-09-28 (×7): qty 1

## 2023-09-28 MED ORDER — ENOXAPARIN SODIUM 80 MG/0.8ML IJ SOSY
80.0000 mg | PREFILLED_SYRINGE | INTRAMUSCULAR | Status: DC
  Administered 2023-09-28 – 2023-09-29 (×2): 80 mg via SUBCUTANEOUS
  Filled 2023-09-28 (×2): qty 0.8

## 2023-09-28 MED ORDER — HEPARIN (PORCINE) 25000 UT/250ML-% IV SOLN
1200.0000 [IU]/h | INTRAVENOUS | Status: DC
  Filled 2023-09-28: qty 250

## 2023-09-28 MED ORDER — AMIODARONE LOAD VIA INFUSION
150.0000 mg | Freq: Once | INTRAVENOUS | Status: AC
  Administered 2023-09-28: 150 mg via INTRAVENOUS
  Filled 2023-09-28: qty 83.34

## 2023-09-28 NOTE — Consult Note (Signed)
 [facility] Liaison Consult Note    09/28/2023  Nathan Santiago [facility] 1931-02-15 [id number]  [facility] Consult   Primary Care Provider:  Mahlon Gammon, MD with [facility] and follows patient at facility.   Insurance: [employer]  Patient was reviewed for high risk score for any barriers to care when returning to community  Met with patient at wife at bedside.  Patient is in bed and not verbally participating at this time.  Wife states patient is LTC at [facility].  Patient was screened for hospitalization and on behalf of [facility]  Care Coordination to assess for post hospital community care needs.  Patient is being considered for a skilled nursing facility level of care for post hospital transition.  Plan:  If transitions to affiliated facility, the [facility] RN can be notified and follow for any known needs for transitional care needs for returning to post facility care coordination needs for returning to community.  For questions or referrals, please contact:  Charlesetta Shanks, RN, BSN, CCM [place]  [facility], [facility] Liaison Direct Dial: [phone] or secure chat Email: [email or web address]

## 2023-09-28 NOTE — Progress Notes (Addendum)
 Rounding Note    Patient Name: Nathan Santiago [facility] Date of Encounter: 09/28/2023  [facility] Cardiologist: Nanetta Batty, MD   Subjective   Pt preparing to work with PT, asymptomatic with RVR but also confused. ABX for UTI..  On my exam he is still confused, high heart rates in the 110s to 120s, but otherwise hemodynamically stable.  Just got an IV placed to get amiodarone started.  Inpatient Medications    Scheduled Meds:  amLODipine  10 mg Oral Daily   atorvastatin  40 mg Oral q1800   carvedilol  12.5 mg Oral BID WC   cholecalciferol  1,000 Units Oral q AM   cloNIDine  0.1 mg Oral TID   clopidogrel  75 mg Oral QPM   enoxaparin (LOVENOX) injection  80 mg Subcutaneous Q24H   finasteride  5 mg Oral q AM   gabapentin  100 mg Oral TID   hydrALAZINE  50 mg Oral Q8H   iron polysaccharides  150 mg Oral Q M,W,F   isosorbide dinitrate  10 mg Oral TID   loratadine  10 mg Oral QHS   melatonin  5 mg Oral QHS   multivitamin with minerals  1 tablet Oral q AM   pantoprazole  40 mg Oral BID   tamsulosin  0.4 mg Oral QPC supper   Continuous Infusions:  amiodarone 60 mg/hr (09/28/23 1019)   Followed by   amiodarone     cefTRIAXone (ROCEPHIN)  IV 1 g (09/27/23 1751)   PRN Meds: acetaminophen **OR** acetaminophen, hydrALAZINE, metoprolol tartrate, ondansetron **OR** ondansetron (ZOFRAN) IV, senna-docusate   Vital Signs    Vitals:   09/28/23 0036 09/28/23 0412 09/28/23 0729 09/28/23 0733  BP: (!) 117/46 (!) 135/98 96/64 (!) 113/56  Pulse: 65 70 (!) 130 96  Resp: 20 20 16 16   Temp: 98.3 F (36.8 C) 98.4 F (36.9 C) 98.6 F (37 C) 98.6 F (37 C)  TempSrc: Axillary Axillary Oral   SpO2: 95% 96% 95% 95%  Weight:  82.6 kg    Height:        Intake/Output Summary (Last 24 hours) at 09/28/2023 1033 Last data filed at 09/28/2023 0731 Gross per 24 hour  Intake 30 ml  Output 2600 ml  Net -2570 ml      09/28/2023    4:12 AM 09/27/2023    4:10 AM 09/26/2023    3:39 PM   Last 3 Weights  Weight (lbs) 182 lb 1.6 oz 181 lb 14.1 oz 184 lb 11.9 oz  Weight (kg) 82.6 kg 82.5 kg 83.8 kg      Telemetry    Converted from SR to Afib RVR at ~0345 this morning, rates primarily in the 130s - Personally Reviewed  ECG    Afib with VR 123, lateral ST depression - Personally Reviewed  Physical Exam   GEN: No acute distress.   Neck: No JVD Cardiac: irregular rhythm. Tachycardic rate  Respiratory: cough, rhonchi throughout GI: Soft, nontender, non-distended  MS: No edema; No deformity. -Mild chronic venous stasis changes, but I agree no edema. Neuro:  Nonfocal  Psych: Normal affect   Labs    High Sensitivity Troponin:   Recent Labs  Lab 09/25/23 2212  TROPONINIHS 6     Chemistry Recent Labs  Lab 09/25/23 2212 09/27/23 0228 09/28/23 0236  NA 142 140 142  K 5.2* 4.6 3.7  CL 111 107 106  CO2 18* 23 26  GLUCOSE 126* 89 120*  BUN 48* 46* 49*  CREATININE 1.83* 1.79* 1.99*  CALCIUM 9.3 9.0 8.7*  MG  --   --  2.1  GFRNONAA 35* 36* 31*  ANIONGAP 13 10 10     Lipids No results for input(s): "CHOL", "TRIG", "HDL", "LABVLDL", "LDLCALC", "CHOLHDL" in the last 168 hours.  Hematology Recent Labs  Lab 09/25/23 2212 09/27/23 0228  WBC 7.4 9.3  RBC 4.01* 3.51*  HGB 11.7* 10.3*  HCT 36.7* 31.0*  MCV 91.5 88.3  MCH 29.2 29.3  MCHC 31.9 33.2  RDW 13.0 12.9  PLT 181 187   Thyroid No results for input(s): "TSH", "FREET4" in the last 168 hours.  BNP Recent Labs  Lab 09/25/23 2212  BNP 488.2*    DDimer No results for input(s): "DDIMER" in the last 168 hours.   Radiology    No results found.  Cardiac Studies   Echo 09/14/23:  1. Left ventricular ejection fraction, by estimation, is 60 to 65%. Left  ventricular ejection fraction by 3D volume is 63 %. The left ventricle has  normal function. The left ventricle has no regional wall motion  abnormalities. There is mild concentric  left ventricular hypertrophy. Left ventricular diastolic parameters  are  consistent with Grade I diastolic dysfunction (impaired relaxation). The  average left ventricular global longitudinal strain is -24.2 %. The global  longitudinal strain is normal.   2. Right ventricular systolic function is normal. The right ventricular  size is normal.   3. Left atrial size was mildly dilated.   4. The mitral valve is normal in structure. Trivial mitral valve  regurgitation. No evidence of mitral stenosis.   5. The aortic valve is tricuspid. There is mild calcification of the  aortic valve. Aortic valve regurgitation is not visualized. Aortic valve  sclerosis/calcification is present, without any evidence of aortic  stenosis.   6. The inferior vena cava is normal in size with <50% respiratory  variability, suggesting right atrial pressure of 8 mmHg.   Patient Profile     89 y.o. male with a hx of CAD remote stening 2006, AAA, hypertension, hyperlipidemia, carotid artery disease CEA 2013, and PAD who is being seen 09/26/2023 for the evaluation of CHF and now new onset Afib in the setting of confusion and UTI.  Assessment & Plan    Afib with RVR - new diagnosis this admission - converted to Afib with RVR around 0345 this morning - rates in the 130s - received scheduled coreg 12.5 mg and 2.5 mg IV lopressor - EKG appears consistent with Afib with VR 123 with lateral ST depression - he remains asymptomatic, but confused - given SBP that is fluctuating 135-96, will hold off on further BB and cardizem, will start IV amiodarone => would run amiodarone for at least the standard 24-hour load and then convert to p.o. load.  Monitor for pauses or bradycardia. - suspect this is likely due to UTI - will start heparin gtt for stroke PPX -> converted from heparin infusion to Lovenox to avoid antagonizing his delirium with frequent blood draws for ACT.;  This patients CHA2DS2-VASc Score and unadjusted Ischemic Stroke Rate (% per year) is equal to 7.2 % stroke rate/year from a  score of 5 Above score calculated as 1 point each if present [CHF, HTN, DM, Vascular=MI/PAD/Aortic Plaque, Age if 65-74, or Male] Above score calculated as 2 points each if present [Age > 75, or Stroke/TIA/TE] Currently doing Lovenox for anticoagulation, but would need to consider pros and cons of DOAC on discharge   Hypertension RAS -  90% left renal artery stenosis at the ostium - received 12.5 mg coreg this morning - hold 10 mg amlodipine, remaining 12.5 mg coreg, 0.1 gm clonidine TID, 50 mg hydralazine TID, 10 mg isordil TID => Suspect some of the hypotension is related to A-fib RVR, agree with holding amlodipine clonidine and hydralazine to pressure stabilizes.   AKI - sCr trended to 1.99 (1.79) - holding IV diuretic today -> at this point I think he is at a minimum euvolemic if not may be a little bit dry.  I think the slight increase in creatinine is more related to the A-fib RVR.  Could consider gentle judicious IV fluid hydration given fever and infection.   HFpEF - has been diuresed with IV lasix, now on hold  - last dose was 60 mg IV lasix yesterday with  - need to monitor renal function - no LE swelling, but rhonchi and cough   CAD s/p remote stenting in 2006 PAD / RAS - not a candidate for intervention, per VVS - continue plavix and lipitor 40 mg -> will need to determine the requirement for DOAC going forward given his new onset A-fib with RVR.  Would probably not be a good candidate for double therapy. - no chest pain, but confused   Febrile yesterday ?UTI - UA with leukocytes and rare bacteria - on rocephin - respiratory panel negative - blood cultures NGTD - may consider repeating a CXR given rhonchi this morning, somewhat cleared with coughing      Signed, Bryan Lemma, MD  09/28/2023, 10:33 AM     ATTENDING ATTESTATION  I have seen, examined and evaluated the patient this AM on rounds along with Micah Flesher, PA-C.  After reviewing all the available  data and chart, we discussed the patients laboratory, study & physical findings as well as symptoms in detail.  I agree with her findings, examination as well as impression recommendations as per our discussion.    Attending adjustments noted in italics.   Was febrile yesterday and appeared to be relatively euvolemic, but now overnight went into A-fib RVR with some drop in blood pressures.  Agree with holding his medications.  I suspect that his A-fib RVR is related to the ongoing UTI, hopefully we can convert him back to sinus with amiodarone bolus and infusion.  This brings up the specter of DOAC versus not DOAC in an elderly gentleman with dementia.  He is already on Plavix which would be discontinued if he went to a DOAC.  Would like to see how long it takes to get him out of A-fib to determine outpatient regimen.  Will continue to monitor    Marykay Lex, MD, MS Bryan Lemma, M.D., M.S. Interventional Cardiologist  [facility]  Pager # [phone] Phone # [phone] [address] [place], [place] [address]     For questions or updates, please contact [facility] Please consult [email or web address] for contact info under

## 2023-09-28 NOTE — Progress Notes (Signed)
 Notified by RN this morning that he has converted to Afib RVR with rates 130-160s. He received 2.5 mg lopressor with improvement in HR to 99, but BP 113/56. Has already received 12.5 mg coreg this morning at 0500.  Hold BP medication. He is confused, possibly due to UTI.   I have advised to hold further IV lopressor - will obtain EKG.   Given renal function, no digoxin. Not on heparin. May ultimately need amiodarone. Will await EKG for further orders.    Roe Rutherford Georgeanna Radziewicz, PA-C 09/28/2023, 8:02 AM [phone] [facility]

## 2023-09-28 NOTE — Progress Notes (Addendum)
 PHARMACY - ANTICOAGULATION CONSULT NOTE  Pharmacy Consult for heparin Indication: atrial fibrillation  Allergies  Allergen Reactions   Lisinopril Cough   Niaspan [Niacin Er (Antihyperlipidemic)] Rash    Patient Measurements: Height: 5\' 4"  (162.6 cm) Weight: 82.6 kg (182 lb 1.6 oz) IBW/kg (Calculated) : 59.2 Heparin Dosing Weight: 82kg  Vital Signs: Temp: 98.6 F (37 C) (02/28 0733) Temp Source: Oral (02/28 0729) BP: 113/56 (02/28 0733) Pulse Rate: 96 (02/28 0733)  Labs: Recent Labs    09/25/23 2212 09/27/23 0228 09/28/23 0236  HGB 11.7* 10.3*  --   HCT 36.7* 31.0*  --   PLT 181 187  --   CREATININE 1.83* 1.79* 1.99*  TROPONINIHS 6  --   --     Estimated Creatinine Clearance: 23.9 mL/min (A) (by C-G formula based on SCr of 1.99 mg/dL (H)).   Medical History: Past Medical History:  Diagnosis Date   AAA (abdominal aortic aneurysm) (HCC)    asymptomatic   AAA (abdominal aortic aneurysm) (HCC) 2010   peripheral  angiogram-- bilateral SFA DISEASE  and 60 to 70% infrarenaal abd. aortic stenosis with 15 -mm gradient   Adenomatous colon polyp 11/2003   CAD (coronary artery disease)    Gait abnormality 02/13/2017   Gastroparesis    pt denies   GERD (gastroesophageal reflux disease)    w/ LPR   History of kidney stones    x2   Hyperlipidemia    Hypertension    IDA (iron deficiency anemia)    Peripheral arterial disease (HCC)    RCEA  by Dr [name]   PUD (peptic ulcer disease)    pt unaware   Vertigo       Assessment: 38 yoM admitted with CHF and now with AF RVR. Pt is not on AC PTA, pharmacy to dose IV heparin. Enoxaparin ppx given yesterday morning, last CBC ok.  Goal of Therapy:  Heparin level 0.3-0.7 units/ml Monitor platelets by anticoagulation protocol: Yes   Plan:  Heparin 4000 units x1 then 1200 units/h Check heparin level in 8h  ADDENDUM will use lovenox for ease of use per cards. Given CrCl ~ 25 will use 1mg /kg once daily  SQ.  Fredonia Highland, PharmD, BCPS, [facility] Clinical Pharmacist [phone] Please check AMION for all [facility] Pharmacy numbers 09/28/2023

## 2023-09-28 NOTE — [place] FL2 (Signed)
 [place] MEDICAID FL2 LEVEL OF CARE FORM     IDENTIFICATION  Patient Name: Nathan Santiago Birthdate: 1930-08-10 Sex: male Admission Date (Current Location): 09/25/2023  [facility] and [place] Number:  Producer, television/film/video and Address:  [facility] [place], [address] [place], [place] [address]      Provider Number: [id number]  Attending Physician Name and Address:  Tyrone Nine, MD  Relative Name and Phone Number:       Current Level of Care: Hospital Recommended Level of Care: Skilled Nursing Facility Prior Approval Number:    Date Approved/Denied:   PASRR Number: [id number]  Discharge Plan: SNF    Current Diagnoses: Patient Active Problem List   Diagnosis Date Noted   Atrial fibrillation with rapid ventricular response (HCC) 09/28/2023   Acute on chronic diastolic heart failure (HCC) 09/27/2023   Encephalopathy 09/27/2023   Fluid overload 09/26/2023   Memory loss 11/16/2022   Hyperlipidemia 07/28/2022   Chronic ischemic heart disease 07/28/2022   Essential (primary) hypertension 07/28/2022   Sensorineural hearing loss, bilateral 07/28/2022   Iron deficiency anemia 07/14/2022   Delirium 07/11/2022   Intertrochanteric fracture of right femur (HCC) 07/09/2022   AKI (acute kidney injury) (HCC) 05/13/2022   Neuropathy 05/13/2022   Orthostatic lightheadedness 05/16/2019   Status post total knee replacement, left 01/03/2019   Chronic cough 04/15/2018   Foreign body in ear 12/21/2017   Hematuria 10/10/2017   Gait abnormality 02/13/2017   Urinary tract infection without hematuria 11/22/2016   Vertigo 11/22/2016   Hypokalemia 11/22/2016   Urinary frequency 09/08/2016   Claudication (HCC) 04/24/2016   Sensorineural hearing loss (SNHL), bilateral 03/09/2016   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   Peripheral arterial disease (HCC) 08/06/2013   GASTROPARESIS 02/25/2009   History of colonic polyps 02/22/2009   Essential hypertension  09/26/2007   History of CAD (coronary artery disease) 09/26/2007   Allergic rhinitis 09/26/2007   GERD 09/26/2007   Peptic ulcer 09/26/2007   UMBILICAL HERNIA 09/26/2007   NEPHROLITHIASIS 09/26/2007    Orientation RESPIRATION BLADDER Height & Weight        O2 (2L O2) External catheter, Incontinent Weight: 182 lb 1.6 oz (82.6 kg) Height:  5\' 4"  (162.6 cm)  BEHAVIORAL SYMPTOMS/MOOD NEUROLOGICAL BOWEL NUTRITION STATUS      Continent Diet (see dc summary)  AMBULATORY STATUS COMMUNICATION OF NEEDS Skin   Extensive Assist Verbally Normal                       Personal Care Assistance Level of Assistance  Bathing, Dressing, Feeding Bathing Assistance: Maximum assistance Feeding assistance: Maximum assistance Dressing Assistance: Maximum assistance     Functional Limitations Info  Sight, Hearing, Speech Sight Info: Impaired (eyeglasses) Hearing Info: Adequate Speech Info: Impaired (Speaks with difficulty)    SPECIAL CARE FACTORS FREQUENCY  PT (By licensed PT), OT (By licensed OT)     PT Frequency: 5x week OT Frequency: 5x week            Contractures Contractures Info: Not present    Additional Factors Info  Code Status, Allergies, Psychotropic, Isolation Precautions Code Status Info: DNR Limited Allergies Info: Lisinopril, Niaspan (niacin Er (antihyperlipidemic)) Psychotropic Info: gabapentin (NEURONTIN)   Isolation Precautions Info: Air/Con pre     Current Medications (09/28/2023):  This is the current hospital active medication list Current Facility-Administered Medications  Medication Dose Route Frequency Provider Last Rate Last Admin   acetaminophen (TYLENOL) tablet 650 mg  650 mg  Oral Q6H PRN Lanae Boast, MD   650 mg at 09/26/23 1309   Or   acetaminophen (TYLENOL) suppository 650 mg  650 mg Rectal Q6H PRN Kc, Dayna Barker, MD       amiodarone (NEXTERONE PREMIX) 360-4.14 MG/200ML-% (1.8 mg/mL) IV infusion  60 mg/hr Intravenous Continuous Marcelino Duster, PA  33.3 mL/hr at 09/28/23 1019 60 mg/hr at 09/28/23 1019   Followed by   amiodarone (NEXTERONE PREMIX) 360-4.14 MG/200ML-% (1.8 mg/mL) IV infusion  30 mg/hr Intravenous Continuous Duke, Roe Rutherford, PA       amLODipine (NORVASC) tablet 10 mg  10 mg Oral Daily Kc, Dayna Barker, MD   10 mg at 09/26/23 1309   atorvastatin (LIPITOR) tablet 40 mg  40 mg Oral q1800 Kc, Dayna Barker, MD   40 mg at 09/27/23 1751   carvedilol (COREG) tablet 12.5 mg  12.5 mg Oral BID WC Dolly Rias, MD   12.5 mg at 09/28/23 0516   cefTRIAXone (ROCEPHIN) 1 g in sodium chloride 0.9 % 100 mL IVPB  1 g Intravenous Q24H Hazeline Junker B, MD 200 mL/hr at 09/27/23 1751 1 g at 09/27/23 1751   cholecalciferol (VITAMIN D3) 25 MCG (1000 UNIT) tablet 1,000 Units  1,000 Units Oral q AM Lanae Boast, MD   1,000 Units at 09/28/23 0703   cloNIDine (CATAPRES) tablet 0.1 mg  0.1 mg Oral TID Lanae Boast, MD   0.1 mg at 09/27/23 2127   clopidogrel (PLAVIX) tablet 75 mg  75 mg Oral QPM Kc, Dayna Barker, MD   75 mg at 09/27/23 1751   enoxaparin (LOVENOX) injection 80 mg  80 mg Subcutaneous Q24H Marcelino Duster, PA   80 mg at 09/28/23 1016   finasteride (PROSCAR) tablet 5 mg  5 mg Oral q AM Lanae Boast, MD   5 mg at 09/28/23 0703   gabapentin (NEURONTIN) capsule 100 mg  100 mg Oral TID Lanae Boast, MD   100 mg at 09/28/23 1017   hydrALAZINE (APRESOLINE) injection 5 mg  5 mg Intravenous Q6H PRN Kc, Dayna Barker, MD       hydrALAZINE (APRESOLINE) tablet 50 mg  50 mg Oral Q8H Salam, Savannah B, RPH   50 mg at 09/28/23 0703   iron polysaccharides (NIFEREX) capsule 150 mg  150 mg Oral Q M,W,F Kc, Dayna Barker, MD   150 mg at 09/26/23 1306   isosorbide dinitrate (ISORDIL) tablet 10 mg  10 mg Oral TID Yvonna Alanis L, PA-C   10 mg at 09/28/23 4098   loratadine (CLARITIN) tablet 10 mg  10 mg Oral QHS Kc, Dayna Barker, MD   10 mg at 09/27/23 2127   melatonin tablet 5 mg  5 mg Oral QHS Kc, Dayna Barker, MD   5 mg at 09/27/23 2127   metoprolol tartrate (LOPRESSOR) injection 2.5 mg  2.5 mg  Intravenous Q4H PRN Dolly Rias, MD   2.5 mg at 09/28/23 1191   multivitamin with minerals tablet 1 tablet  1 tablet Oral q AM Lanae Boast, MD   1 tablet at 09/28/23 0703   ondansetron (ZOFRAN) tablet 4 mg  4 mg Oral Q6H PRN Kc, Dayna Barker, MD       Or   ondansetron (ZOFRAN) injection 4 mg  4 mg Intravenous Q6H PRN Kc, Ramesh, MD       pantoprazole (PROTONIX) EC tablet 40 mg  40 mg Oral BID Kc, Ramesh, MD   40 mg at 09/28/23 1017   senna-docusate (Senokot-S) tablet 1 tablet  1 tablet Oral QHS PRN Lanae Boast, MD  tamsulosin (FLOMAX) capsule 0.4 mg  0.4 mg Oral QPC supper Lanae Boast, MD   0.4 mg at 09/27/23 1751     Discharge Medications: Please see discharge summary for a list of discharge medications.  Relevant Imaging Results:  Relevant Lab Results:   Additional Information SS# [id number]  Michaela Corner, [place]

## 2023-09-28 NOTE — Progress Notes (Addendum)
 Modified Barium Swallow Study  Patient Details  Name: Nathan Santiago MRN: [id number] Date of Birth: [birth year]  Today's Date: 09/28/2023  Modified Barium Swallow completed.  Full report located under Chart Review in the Imaging Section.  History of Present Illness Pt is a 89 y/o male presenting with lethargy, weakness and lower extremity edema. Pt admitted for acute CHF exacerbation, AKI and hyperkalemia.  PMH significant of HTN, HLD, CAD, PAD. CXR  Stable enlarged cardiac silhouette and chronic central vascular congestion, small bilateral pleural effusions, with left basilar atelectasis.   Clinical Impression Pt exhibited minimal pharyngeal dysphagia without aspiration during study. His strength and efficiency of swallow was within normal limits. There were 2 instances of mistimed swallow where barium spilled over epiglottis prior to initiation but ejected during the swallow (PAS 2) and barium spilled to pyriform sinuses and into laryngeal vestibule before laryngeal closure (PAS 3). Verbal cues to clear throat removed penetrate. Minimal vallecular residue with thin barium x 2 throughout study with spontaneous swallow cleared. Orally he controlled barium pill with thin to transit to posterior oral cavity through PES and timely transit through GE junction. Straw use was effective with thin. His DIGEST score is a 1 (mild impairment). Pt may have instances where he coughs during meals however additional strategies not needed other than general aspiration precautions with proper positioning and small sips. Recommend he continue regular texture, thin liquids, pills (one at a time) with thin. No further ST is needed. Factors that may increase risk of adverse event in presence of aspiration Rubye Oaks & Clearance Coots 2021):    DIGEST Swallow Severity Rating*  Safety:   Efficiency:  Overall Pharyngeal Swallow Severity:  1: mild; 2: moderate; 3: severe; 4: profound  *The Dynamic Imaging Grade of Swallowing  Toxicity is standardized for the head and neck cancer population, however, demonstrates promising clinical applications across populations to standardize the clinical rating of pharyngeal swallow safety and severity.   Swallow Evaluation Recommendations Recommendations: PO diet PO Diet Recommendation: Regular;Thin liquids (Level 0) Liquid Administration via: Straw;Cup Medication Administration: Whole meds with liquid (one at a time) Supervision: Staff to assist with self-feeding;Other (comment) (due to tremors) Swallowing strategies  : Slow rate;Small bites/sips Postural changes: Position pt fully upright for meals Oral care recommendations: Oral care BID (2x/day)      Royce Macadamia 09/28/2023,2:03 PM

## 2023-09-28 NOTE — Evaluation (Addendum)
 Clinical/Bedside Swallow Evaluation Patient Details  Name: Nathan Santiago MRN: [id number] Date of Birth: January 13, 1931  Today's Date: 09/28/2023 Time: SLP Start Time (ACUTE ONLY): 0831 SLP Stop Time (ACUTE ONLY): 0841 SLP Time Calculation (min) (ACUTE ONLY): 10 min  Past Medical History:  Past Medical History:  Diagnosis Date   AAA (abdominal aortic aneurysm) (HCC)    asymptomatic   AAA (abdominal aortic aneurysm) (HCC) 2010   peripheral  angiogram-- bilateral SFA DISEASE  and 60 to 70% infrarenaal abd. aortic stenosis with 15 -mm gradient   Adenomatous colon polyp 11/2003   CAD (coronary artery disease)    Gait abnormality 02/13/2017   Gastroparesis    pt denies   GERD (gastroesophageal reflux disease)    w/ LPR   History of kidney stones    x2   Hyperlipidemia    Hypertension    IDA (iron deficiency anemia)    Peripheral arterial disease (HCC)    RCEA  by Dr [name]   PUD (peptic ulcer disease)    pt unaware   Vertigo    Past Surgical History:  Past Surgical History:  Procedure Laterality Date   ABDOMINAL AORTOGRAM W/LOWER EXTREMITY Bilateral 08/05/2018   Procedure: ABDOMINAL AORTOGRAM W/LOWER EXTREMITY;  Surgeon: Runell Gess, MD;  Location: [facility];  Service: Cardiovascular;  Laterality: Bilateral;   ABDOMINAL AORTOGRAM W/LOWER EXTREMITY N/A 09/26/2021   Procedure: ABDOMINAL AORTOGRAM W/LOWER EXTREMITY;  Surgeon: Runell Gess, MD;  Location: [facility];  Service: Cardiovascular;  Laterality: N/A;   AORTOGRAM  04/24/2016    Abdominal aortogram, bilateral iliac angiogram, bifemoral runoff   CARDIAC CATHETERIZATION  05/12/2005   RCA   carotid doppler  01/24/2013   RICA endarterectomy,left CCA 0-49%; left bulb and prox ICA 50-69%; bilaateral subclavian < 50%   CAROTID ENDARTERECTOMY  11/01/2010   CATARACT EXTRACTION     COLONOSCOPY     CORONARY ANGIOPLASTY  05/18/2005   2 STENTS distal RCA AND PROXIMAL-MID RCA   5 total stents per pt.    CYSTOSCOPY/URETEROSCOPY/HOLMIUM LASER/STENT PLACEMENT Left 01/11/2018   Procedure: LEFT URETEROSCOPY/HOLMIUM LASER/STENT PLACEMENT;  Surgeon: Crist Fat, MD;  Location: [facility];  Service: Urology;  Laterality: Left;   DOPPLER ECHOCARDIOGRAPHY  02/07/2012   EF 55%,SHOWED NO ISCHEMIA    HERNIA REPAIR     umbilical   lower arterial  doppler  02/04/2013   aotra 1.5 x 1.5 cm; distal abd aorta 70-99%,proximal common iliac arteries -very stenotic with increased velocities>50%,may be falsely elevated as a result of residual plaque from the distal aorta stenosis   lower extremity doppler  June 18 ,2013   ABI'S ABNORMAL, RABI was 0.88 and LABI 0.75 ,with 3-vessel  run off   NM MYOVIEW LTD  MAY 23,2011   showed no significant ischemia;   NM MYOVIEW LTD  04/22/2008   ef 77%,exercise capcity 6 METS ,exaggerated blood pressure response to exercise   PERIPHERAL VASCULAR CATHETERIZATION N/A 04/24/2016   Procedure: Lower Extremity Angiography;  Surgeon: Runell Gess, MD;  Location: [facility];  Service: Cardiovascular;  Laterality: N/A;   PERIPHERAL VASCULAR CATHETERIZATION  04/24/2016   Procedure: Peripheral Vascular Intervention;  Surgeon: Runell Gess, MD;  Location: [facility] IN[facility]ASIVE CV LAB;  Service: Cardiovascular;;  Aorta   retrograde central aortic catheterization  05/19/2005   cutting balloon atherectomy, c-circ stenosis with DES STENTING CYPHER   TONSILLECTOMY     TOTAL HIP ARTHROPLASTY Right 07/11/2022   Procedure: TOTAL HIP ARTHROPLASTY ANTERIOR APPROACH;  Surgeon: Durene Romans, MD;  Location: [facility];  Service: Orthopedics;  Laterality: Right;   TOTAL KNEE ARTHROPLASTY Left 01/03/2019   Procedure: TOTAL KNEE ARTHROPLASTY;  Surgeon: Beverely Low, MD;  Location: [facility];  Service: Orthopedics;  Laterality: Left;  with IS block   TRANSURETHRAL RESECTION OF PROSTATE N/A 09/08/2016   Procedure: TRANSURETHRAL RESECTION OF THE PROSTATE (TURP);  Surgeon: Crist Fat, MD;  Location: [facility];  Service: Urology;  Laterality: N/A;   TRANSURETHRAL RESECTION OF PROSTATE     10-10-17  Dr. Marlou Porch   TRANSURETHRAL RESECTION OF PROSTATE N/A 10/10/2017   Procedure: TRANSURETHRAL RESECTION OF THE PROSTATE (TURP);  Surgeon: Crist Fat, MD;  Location: [facility];  Service: Urology;  Laterality: N/A;   VASECTOMY     HPI:  Pt is a 89 y/o male presenting from SNF with lethargy, weakness and lower extremity edema. Pt admitted for acute CHF exacerbation, AKI and hyperkalemia.  PMH significant of HTN, HLD, CAD, PAD. CXR  Stable enlarged cardiac silhouette and chronic central vascular congestion, small bilateral pleural effusions, with left basilar atelectasis.    Assessment / Plan / Recommendation  Clinical Impression  Pt difficult to determine recent swallow status as he gave confllicting responses to having difficulty but eventually stated he has has some coughing with eating. Oromotor ability was adequate, has upper partial plate and missing several lower dentition. He is exhibiting signs of pharyngeal dysphagia with consistent cough after cup sip thin liquid and one cough following puree. Solid not administered during evaluation. He was also coughing with nursing which led to this consult. Further objective assessment is recommended with MBS which can be completed at 12:30. He can continue with po's until MBS. SLP Visit Diagnosis: Dysphagia, unspecified (R13.10)    Aspiration Risk  Moderate aspiration risk    Diet Recommendation Regular;Thin liquid    Liquid Administration via: Cup;Straw Medication Administration: Whole meds with puree Supervision: Patient able to self feed Compensations: Slow rate;Small sips/bites Postural Changes: Seated upright at 90 degrees    Other  Recommendations Oral Care Recommendations: Oral care BID    Recommendations for follow up therapy are one component of a multi-disciplinary discharge planning process, led by the attending physician.   Recommendations may be updated based on patient status, additional functional criteria and insurance authorization.  Follow up Recommendations  (TBD)      Assistance Recommended at Discharge    Functional Status Assessment    Frequency and Duration            Prognosis Prognosis for improved oropharyngeal function: Good      Swallow Study   General HPI: Pt is a 89 y/o male presenting with lethargy, weakness and lower extremity edema. Pt admitted for acute CHF exacerbation, AKI and hyperkalemia.  PMH significant of HTN, HLD, CAD, PAD. CXR  Stable enlarged cardiac silhouette and chronic central vascular congestion, small bilateral pleural effusions, with left basilar atelectasis. Type of Study: Bedside Swallow Evaluation Previous Swallow Assessment: none Diet Prior to this Study: Regular;Thin liquids (Level 0) Temperature Spikes Noted: No Respiratory Status: Nasal cannula History of Recent Intubation: No Behavior/Cognition: Alert;Cooperative;Pleasant mood;Requires cueing Oral Cavity Assessment: Within Functional Limits Oral Care Completed by SLP: No Oral Cavity - Dentition: Missing dentition (missing several lower) Vision: Functional for self-feeding Self-Feeding Abilities: Needs assist Patient Positioning: Upright in bed Baseline Vocal Quality: Normal Volitional Cough: Strong Volitional Swallow: Able to elicit    Oral/Motor/Sensory Function Overall Oral Motor/Sensory Function: Within functional limits   Ice Chips  Ice chips: Not tested   Thin Liquid Thin Liquid: Impaired Presentation: Cup Oral Phase Impairments:  (none) Oral Phase Functional Implications:  (none) Pharyngeal  Phase Impairments: Cough - Immediate    Nectar Thick Nectar Thick Liquid: Not tested   Honey Thick Honey Thick Liquid: Not tested   Puree Puree: Impaired Pharyngeal Phase Impairments: Cough - Immediate   Solid     Solid: Not tested      Royce Macadamia 09/28/2023,9:05 AM

## 2023-09-28 NOTE — Evaluation (Signed)
 Physical Therapy Evaluation Patient Details Name: Nathan Santiago MRN: [id number] DOB: 1931/07/14 Today's Date: 09/28/2023  History of Present Illness  Nathan Santiago is a 89 y.o. male admitted 09/25/23 d/t lethargy. He was noted to be volume overloaded on exam with elevated BNP, and CXR with cardiomegaly, central vascular congestion, and pleural effusion. PMH of CAD s/p stents 2006, AAA, renal artery stenosis, extensive PVD, HTN, HLD, and chronic back pain.   Clinical Impression  Pt admitted with above diagnosis. PTA, he was residing at [facility] and receiving therapy services. Examination limited by lethargy, pt's impaired cognition, and lack of family member present. Pt was oriented to himself and place only. He followed one step commands inconsistently and required increase time for all mobility. Pt currently with functional limitations due to the deficits listed below (see PT Problem List). Pt required min-modA for supine>sit and +2 assist to transfer to recliner chair with use of sara stedy. He demonstrated a R lateral lean during seated balance EOB. Pt will benefit from acute skilled PT to increase his independence and safety with mobility to allow discharge. Recommend return to post-acute rehab, <3 hours/day of therapy.      If plan is discharge home, recommend the following: Two people to help with walking and/or transfers;A lot of help with bathing/dressing/bathroom;Supervision due to cognitive status;Assist for transportation;Help with stairs or ramp for entrance;Direct supervision/assist for medications management;Direct supervision/assist for financial management;Assistance with cooking/housework   Can travel by private vehicle   No    Equipment Recommendations Hoyer lift  Recommendations for Other Services       Functional Status Assessment Patient has had a recent decline in their functional status and demonstrates the ability to make significant improvements in function  in a reasonable and predictable amount of time.     Precautions / Restrictions Precautions Precautions: Fall Recall of Precautions/Restrictions: Intact Precaution/Restrictions Comments: monitor O2 Restrictions Weight Bearing Restrictions Per Provider Order: No      Mobility  Bed Mobility Overal bed mobility: Needs Assistance Bed Mobility: Supine to Sit     Supine to sit: Min assist, Mod assist, HOB elevated, Used rails     General bed mobility comments: Pt took increased time to reach EOB, minA at trunk to obtain upright, and modA to scoot fwd via bed ped until feet supported. Cued pt on sequencing and use of handrail.    Transfers Overall transfer level: Needs assistance Equipment used: Rolling walker (2 wheels), Ambulation equipment used Transfers: Sit to/from Stand, Bed to chair/wheelchair/BSC Sit to Stand: From elevated surface, Max assist, Via lift equipment, Mod assist, +2 physical assistance, +2 safety/equipment           General transfer comment: Attempted STS from raised bed height. Cued pt on proper hand positioning and sequencing. Pt required maxA and was unable to clear hips. Introduced stedy and had Scientist, research (medical) to assist with transfer. Pt was able to assist in pulling himself up with modA x2. Pt stood from stedy seat with minA x1 for pericare to be addressed. He demonstrated good eccentric control when sitting to recliner chair. Transfer via Lift Equipment: Stedy  Ambulation/Gait               General Gait Details: Deferred, per chart review w/c user at baseline.  Stairs            Wheelchair Mobility     Tilt Bed    Modified Rankin (Stroke Patients Only)       Balance Overall  balance assessment: Needs assistance Sitting-balance support: Bilateral upper extremity supported, Feet supported Sitting balance-Leahy Scale: Fair Sitting balance - Comments: Pt sat EOB with supervision-CGA for ~39mins. He demonstrated a R lateral lean and  fiddled with the lines/leads in his LUE. Postural control: Right lateral lean Standing balance support: Bilateral upper extremity supported, During functional activity Standing balance-Leahy Scale: Poor Standing balance comment: Pt required sara stedy to be able to stand with modA x2 and minA x1 for static stance.                             Pertinent Vitals/Pain Pain Assessment Pain Assessment: No/denies pain    Home Living Family/patient expects to be discharged to:: Skilled nursing facility                   Additional Comments: Per Chart review pt is from [facility]    Prior Function Prior Level of Function : Needs assist;Patient poor historian/Family not available             Mobility Comments: Pt reports he completes a stand pivot transfer to/from w/c and denies use of RW. Pt denied falling and then later reported he had fallen. ADLs Comments: Pt reports recieving help from facility staff, but is unable to state what instances they help and how much.     Extremity/Trunk Assessment   Upper Extremity Assessment Upper Extremity Assessment: Defer to OT evaluation    Lower Extremity Assessment Lower Extremity Assessment: Generalized weakness    Cervical / Trunk Assessment Cervical / Trunk Assessment: Kyphotic  Communication   Communication Communication: Impaired Factors Affecting Communication: Reduced clarity of speech;Difficulty expressing self    Cognition Arousal: Lethargic Behavior During Therapy: Flat affect   PT - Cognitive impairments: Orientation, Initiation, Sequencing, Problem solving, Safety/Judgement, No family/caregiver present to determine baseline   Orientation impairments: Time, Situation                   PT - Cognition Comments: Pt was unable to state the month, year, or why he was in the hospital. Reoriented and assessed later with no carryover. Following commands: Impaired Following commands impaired: Follows one  step commands inconsistently     Cueing Cueing Techniques: Verbal cues, Gestural cues, Tactile cues, Visual cues     General Comments General comments (skin integrity, edema, etc.): Pt greeted on 2L O2, doffed and pt tolerated RA throughout session with SpO2 maintained >93%. Pt with intermittent AFib, HR max at 148bpm. Start of Session BP 103/60 (71). End of Session BP 100/60 (70). Pt denied dizziness, lightheadedness, and SOB.    Exercises     Assessment/Plan    PT Assessment Patient needs continued PT services  PT Problem List Decreased strength;Decreased activity tolerance;Decreased balance;Decreased mobility;Decreased cognition;Decreased safety awareness       PT Treatment Interventions DME instruction;Functional mobility training;Therapeutic activities;Therapeutic exercise;Balance training;Neuromuscular re-education;Patient/family education;Wheelchair mobility training    PT Goals (Current goals can be found in the Care Plan section)  Acute Rehab PT Goals Patient Stated Goal: Pt unable to state a goal d/t AMS PT Goal Formulation: Patient unable to participate in goal setting Time For Goal Achievement: 10/12/23 Potential to Achieve Goals: Fair    Frequency Min 1X/week     Co-evaluation               AM-PAC PT "6 Clicks" Mobility  Outcome Measure Help needed turning from your back to your side while in a  flat bed without using bedrails?: A Lot Help needed moving from lying on your back to sitting on the side of a flat bed without using bedrails?: A Lot Help needed moving to and from a bed to a chair (including a wheelchair)?: Total Help needed standing up from a chair using your arms (e.g., wheelchair or bedside chair)?: Total Help needed to walk in hospital room?: Total Help needed climbing 3-5 steps with a railing? : Total 6 Click Score: 8    End of Session Equipment Utilized During Treatment: Gait belt Activity Tolerance: Patient tolerated treatment  well Patient left: in chair;with call bell/phone within reach;with chair alarm set Nurse Communication: Mobility status;Other (comment) (Pt left on RA) PT Visit Diagnosis: Muscle weakness (generalized) (M62.81);History of falling (Z91.81);Unsteadiness on feet (R26.81);Other abnormalities of gait and mobility (R26.89)    Time: 1610-9604 PT Time Calculation (min) (ACUTE ONLY): 35 min   Charges:   PT Evaluation $PT Eval Moderate Complexity: 1 Mod PT Treatments $Therapeutic Activity: 8-22 mins PT General Charges $$ ACUTE PT VISIT: 1 Visit         Cheri Guppy, PT, DPT Acute Rehabilitation Services Office: [phone] Secure Chat Preferred  Richardson Chiquito 09/28/2023, 10:31 AM

## 2023-09-28 NOTE — Progress Notes (Signed)
 [facility] PROGRESS NOTE  Nathan Santiago (DOB: 1930/08/31) [id number]:[id number] PCP: Mahlon Gammon, MD  Brief Narrative: Nathan Santiago is a 89 y.o. male with a history of CAD s/p stents 2006, AAA, renal artery stenosis, extensive PVD, HTN, HLD, chronic back pain who presented to the ED on 09/25/2023 from nursing facility due to lethargy. He was noted to be volume overloaded on exam with elevated BNP, and CXR with cardiomegaly, central vascular congestion, and pleural effusion. Lasix was administered, cardiology consulted, and patient admitted for suspected acute on chronic HFpEF complicated by AKI. He has exhibited significant delirium and on 2/27 developed fever to 101F. CTX given for UTI. AFib with RVR 2/28, converted with amiodarone load to NSR. Mentation improving.   Subjective: Much more cogent today, no chest pain, dyspnea, cough, per pt or other complaints. Felt ill this morning when rates were elevated with AFib, has improved since conversion back to NSR.   Objective: BP (!) 156/41 (BP Location: Left Arm)   Pulse 73   Temp 98.8 F (37.1 C) (Oral)   Resp 14   Ht 5\' 4"  (1.626 m)   Wt 82.6 kg   SpO2 90%   BMI 31.26 kg/m   Gen: No distress, elderly male Pulm: Coarse but improves with repeated inspirations, nonlabored  CV: RRR, NSR on monitor this PM. No significant LE edema. GI: Soft, NT, ND, +BS  Neuro: Alert and oriented. No new focal deficits. Ext: Warm, no deformities Skin: No rashes, lesions or ulcers on visualized skin   Assessment & Plan: Acute metabolic encephalopathy, acute delirium: Suspect hospital delirium and UTI-related encephalopathy.  - Delirium precautions, Tx UTI  Fever, UTI: WBC normal. No consolidation on admission CXR nor follow up despite concern for dysphagia (may have been encephalopathy-related). RSV, covid, flu PCR's negative.  - Monitor blood cultures (NGTD) and urine Cx (no result yet).  - Continue ceftriaxone   New diagnosis of  paroxysmal atrial fibrillation with RVR: Converted quickly to NSR with amiodarone.  - Continue coreg (was held yesterday) and prn metoprolol.  - CHA2DS2-VASc score is 5 (CHF, HTN, atherosclerosis, age [2]), cardiology started lovenox 1mg /kg q24h.   Acute on chronic HFpEF, HTN: Appeared to be volume overloaded initially. His weight is below outpatient clinic weight, however.  - Appreciate cardiology recommendations, holding further diuresis. Volume status improved. If not taking po, may give some volume back.  - Holding ARB w/AKI/hyperkalemia.  - Holding norvasc 10mg , changed hydralazine to essentially bidil (may hold per cardiology, changed metoprolol to coreg. Continuing clonidine for now to avoid rebound.  AKI on stage IIIb CKD:  - Holding thiazide diuretic while giving loop diuretic. Recheck in AM. - GDMT deferred to cardiology - No retention noted. Continue tamsulosin/finasteride for BPH.  PVD, CAD s/p stents, renal artery stenosis: Not a candidate for common femoral endarterectomy per vascular surgery. Also not planning on intervention for renal artery stenosis.  - Continue plavix, statin, anticoagulation started as above.  Deconditioning, debility:  - PT/OT, CSW consulted.  Iron deficiency anemia/anemia CKD:  - Continue Fe supplement.  DNR: POA.   Tyrone Nine, MD [facility] [email or web address] 09/28/2023, 5:41 PM

## 2023-09-29 DIAGNOSIS — I10 Essential (primary) hypertension: Secondary | ICD-10-CM | POA: Diagnosis not present

## 2023-09-29 DIAGNOSIS — E8779 Other fluid overload: Secondary | ICD-10-CM | POA: Diagnosis not present

## 2023-09-29 DIAGNOSIS — I4891 Unspecified atrial fibrillation: Secondary | ICD-10-CM | POA: Diagnosis not present

## 2023-09-29 LAB — CBC
HCT: 33.3 % — ABNORMAL LOW (ref 39.0–52.0)
Hemoglobin: 11.1 g/dL — ABNORMAL LOW (ref 13.0–17.0)
MCH: 29.5 pg (ref 26.0–34.0)
MCHC: 33.3 g/dL (ref 30.0–36.0)
MCV: 88.6 fL (ref 80.0–100.0)
Platelets: 185 10*3/uL (ref 150–400)
RBC: 3.76 MIL/uL — ABNORMAL LOW (ref 4.22–5.81)
RDW: 12.6 % (ref 11.5–15.5)
WBC: 9.4 10*3/uL (ref 4.0–10.5)
nRBC: 0 % (ref 0.0–0.2)

## 2023-09-29 LAB — BASIC METABOLIC PANEL
Anion gap: 11 (ref 5–15)
BUN: 40 mg/dL — ABNORMAL HIGH (ref 8–23)
CO2: 25 mmol/L (ref 22–32)
Calcium: 9.2 mg/dL (ref 8.9–10.3)
Chloride: 108 mmol/L (ref 98–111)
Creatinine, Ser: 1.39 mg/dL — ABNORMAL HIGH (ref 0.61–1.24)
GFR, Estimated: 48 mL/min — ABNORMAL LOW (ref >60)
Glucose, Bld: 127 mg/dL — ABNORMAL HIGH (ref 70–99)
Potassium: 3.7 mmol/L (ref 3.5–5.1)
Sodium: 144 mmol/L (ref 135–145)

## 2023-09-29 MED ORDER — AMLODIPINE BESYLATE 10 MG PO TABS
10.0000 mg | ORAL_TABLET | Freq: Every day | ORAL | Status: DC
Start: 2023-09-29 — End: 2023-10-01
  Administered 2023-09-29 – 2023-10-01 (×3): 10 mg via ORAL
  Filled 2023-09-29 (×3): qty 1

## 2023-09-29 MED ORDER — SODIUM CHLORIDE 0.9 % IV SOLN
2.0000 g | INTRAVENOUS | Status: DC
  Administered 2023-09-29 – 2023-10-01 (×3): 2 g via INTRAVENOUS
  Filled 2023-09-29 (×3): qty 12.5

## 2023-09-29 MED ORDER — ENOXAPARIN SODIUM 80 MG/0.8ML IJ SOSY
80.0000 mg | PREFILLED_SYRINGE | Freq: Two times a day (BID) | INTRAMUSCULAR | Status: DC
  Administered 2023-09-29 – 2023-09-30 (×3): 80 mg via SUBCUTANEOUS
  Filled 2023-09-29 (×3): qty 0.8

## 2023-09-29 NOTE — Progress Notes (Signed)
 [facility] PROGRESS NOTE  Nathan Santiago (DOB: 1930/08/01) XBJ:[id number] PCP: Mahlon Gammon, MD  Brief Narrative: Nathan Santiago is a 89 y.o. male with a history of CAD s/p stents 2006, AAA, renal artery stenosis, extensive PVD, HTN, HLD, chronic back pain who presented to the ED on 09/25/2023 from nursing facility due to lethargy. He was noted to be volume overloaded on exam with elevated BNP, and CXR with cardiomegaly, central vascular congestion, and pleural effusion. Lasix was administered, cardiology consulted, and patient admitted for suspected acute on chronic HFpEF complicated by AKI. He has exhibited significant delirium and on 2/27 developed fever to 101F. CTX given for UTI. AFib with RVR 2/28, converted with amiodarone load to NSR. Mentation improving.   Subjective: He wants to leave the hospital, knows where he is, remembers me from yesterday, oriented to year, guessed the month was May or June, then guessed March after I said we just finished February. No dyspnea or chest pain.   Objective: BP (!) 160/44 (BP Location: Left Arm)   Pulse 77   Temp 98.7 F (37.1 C) (Oral)   Resp 20   Ht 5\' 4"  (1.626 m)   Wt 79.9 kg   SpO2 92%   BMI 30.24 kg/m   Gen: Elderly frail male in no acute distress Pulm: Clear anterolaterally, nonlabored  CV: RRR, NSR on monitor, trace LE edema GI: Soft, NT, ND, +BS Neuro: Alert and mostly oriented but slow to speak/think. No new focal deficits. Ext: Warm, no deformities. Skin: Unchanged LE's, no new rashes, lesions or ulcers on visualized skin   Assessment & Plan: Acute metabolic encephalopathy, acute delirium: Suspect hospital delirium and UTI-related encephalopathy. Has improved considerably. - Delirium precautions, Tx UTI  Pseudomonas UTI:   - Monitor blood cultures (NGTD) and urine Cx susceptibilities, change ceftriaxone to cefepime (monitor mentation on this)  New diagnosis of paroxysmal atrial fibrillation with RVR:  Converted quickly to NSR with amiodarone.  - Continue coreg (was held yesterday) and prn metoprolol.  - CHA2DS2-VASc score is 5 (CHF, HTN, atherosclerosis, age [2]), cardiology started lovenox, convert to 1mg /kg q12h, d/w pharmacy.  Acute on chronic HFpEF, HTN: Appeared to be volume overloaded initially. His weight is below outpatient clinic weight, however.  - Appreciate cardiology recommendations, holding further diuresis. Appears roughly euvolemic.   - Continue isosorbide/hydralazine, clonidine, norvasc, coreg.  AKI on stage IIIb CKD:  - Holding thiazide diuretic while giving loop diuretic. Continues improving. - GDMT deferred to cardiology  BPH:  - No retention noted. Continue tamsulosin/finasteride  PVD, CAD s/p stents, renal artery stenosis: Not a candidate for common femoral endarterectomy per vascular surgery. Also not planning on intervention for renal artery stenosis.  - Continue plavix, statin, anticoagulation started as above. Per cardiology, could consider DC plavix given remoteness of stent  Deconditioning, debility:  - PT/OT, CSW consulted. Plan disposition to SNF.   Iron deficiency anemia/anemia CKD:  - Continue Fe supplement.  DNR: POA.   Tyrone Nine, MD [facility] [email or web address] 09/29/2023, 2:50 PM

## 2023-09-29 NOTE — Progress Notes (Signed)
 Crcl>30 ml/min. Ok to change Lovenox to 80mg  SQ BID. F/u with oral anticoag.  Urine culture grew out pseudomonas. Change ceftriaxone to cefepime 2g IV q24 x 5d per Dr. Jarvis Newcomer.  Ulyses Southward, PharmD, BCIDP, AAHIVP, CPP Infectious Disease Pharmacist 09/29/2023 2:33 PM

## 2023-09-29 NOTE — Progress Notes (Signed)
 Rounding Note    Patient Name: [facility] Date of Encounter: 09/29/2023  [facility] Cardiologist: Nanetta Batty, MD   Subjective   Converted to sinus rhythm yesterday Still delirious Creatinine has improved from 1.9-1.39 (AKI on CKD stage III)  Inpatient Medications    Scheduled Meds:  atorvastatin  40 mg Oral q1800   carvedilol  12.5 mg Oral BID WC   cholecalciferol  1,000 Units Oral q AM   cloNIDine  0.1 mg Oral TID   clopidogrel  75 mg Oral QPM   enoxaparin (LOVENOX) injection  80 mg Subcutaneous Q24H   finasteride  5 mg Oral q AM   gabapentin  100 mg Oral TID   hydrALAZINE  50 mg Oral Q8H   iron polysaccharides  150 mg Oral Q M,W,F   isosorbide dinitrate  10 mg Oral TID   loratadine  10 mg Oral QHS   melatonin  5 mg Oral QHS   multivitamin with minerals  1 tablet Oral q AM   pantoprazole  40 mg Oral BID   tamsulosin  0.4 mg Oral QPC supper   Continuous Infusions:  cefTRIAXone (ROCEPHIN)  IV 1 g (09/28/23 1736)   PRN Meds: acetaminophen **OR** acetaminophen, hydrALAZINE, metoprolol tartrate, ondansetron **OR** ondansetron (ZOFRAN) IV, senna-docusate   Vital Signs    Vitals:   09/28/23 1925 09/29/23 0045 09/29/23 0515 09/29/23 0739  BP: (!) 166/44 (!) 168/50 (!) 187/61 (!) 149/48  Pulse: 73 (!) 58 71   Resp: 18 18 19 18   Temp: 98.2 F (36.8 C) 98.7 F (37.1 C) 98.7 F (37.1 C) 98.7 F (37.1 C)  TempSrc: Oral Oral Oral Axillary  SpO2: 92% 91% 93% 92%  Weight:   79.9 kg   Height:        Intake/Output Summary (Last 24 hours) at 09/29/2023 1158 Last data filed at 09/29/2023 0045 Gross per 24 hour  Intake 189 ml  Output 1025 ml  Net -836 ml      09/29/2023    5:15 AM 09/28/2023    4:12 AM 09/27/2023    4:10 AM  Last 3 Weights  Weight (lbs) 176 lb 2.4 oz 182 lb 1.6 oz 181 lb 14.1 oz  Weight (kg) 79.9 kg 82.6 kg 82.5 kg      Telemetry    Sinus rhythm- Personally Reviewed  ECG    No new- Personally Reviewed  Physical Exam    Vitals:   09/29/23 0515 09/29/23 0739  BP: (!) 187/61 (!) 149/48  Pulse: 71   Resp: 19 18  Temp: 98.7 F (37.1 C) 98.7 F (37.1 C)  SpO2: 93% 92%    GEN: somnolent Cardiac: RRR, no murmurs, rubs, or gallops.  Respiratory: Clear to auscultation bilaterally anteriorly, limited exam GI: Soft, nontender, non-distended  MS: No edema; No deformity. Neuro:  Nonfocal  Psych: delerium  Labs    High Sensitivity Troponin:   Recent Labs  Lab 09/25/23 2212 09/28/23 0826  TROPONINIHS 6 20*     Chemistry Recent Labs  Lab 09/27/23 0228 09/28/23 0236 09/29/23 0752  NA 140 142 144  K 4.6 3.7 3.7  CL 107 106 108  CO2 23 26 25   GLUCOSE 89 120* 127*  BUN 46* 49* 40*  CREATININE 1.79* 1.99* 1.39*  CALCIUM 9.0 8.7* 9.2  MG  --  2.1  --   GFRNONAA 36* 31* 48*  ANIONGAP 10 10 11     Lipids No results for input(s): "CHOL", "TRIG", "HDL", "LABVLDL", "LDLCALC", "CHOLHDL" in  the last 168 hours.  Hematology Recent Labs  Lab 09/25/23 2212 09/27/23 0228 09/29/23 0752  WBC 7.4 9.3 9.4  RBC 4.01* 3.51* 3.76*  HGB 11.7* 10.3* 11.1*  HCT 36.7* 31.0* 33.3*  MCV 91.5 88.3 88.6  MCH 29.2 29.3 29.5  MCHC 31.9 33.2 33.3  RDW 13.0 12.9 12.6  PLT 181 187 185   Thyroid No results for input(s): "TSH", "FREET4" in the last 168 hours.  BNP Recent Labs  Lab 09/25/23 2212  BNP 488.2*    DDimer No results for input(s): "DDIMER" in the last 168 hours.   Radiology    DG CHEST PORT 1 VIEW Result Date: 09/28/2023 CLINICAL DATA:  Cough EXAM: PORTABLE CHEST 1 VIEW COMPARISON:  X-ray 09/25/2023 FINDINGS: No consolidation, pneumothorax or edema. Tiny left pleural effusion with blunting of the left costophrenic angle. Normal cardiopericardial silhouette. Calcified aorta. Overlapping cardiac leads. Degenerative changes of the spine. IMPRESSION: Tiny left effusion versus pleural thickening. No consolidation or edema Electronically Signed   By: Karen Kays M.D.   On: 09/28/2023 16:40   DG  Swallowing Func-Speech Pathology Result Date: 09/28/2023 Table formatting from the original result was not included. Modified Barium Swallow Study Patient Details Name: Nathan Santiago MRN: [id number] Date of Birth: 16-Jan-1931 Today's Date: 09/28/2023 HPI/PMH: HPI: Pt is a 89 y/o male presenting with lethargy, weakness and lower extremity edema. Pt admitted for acute CHF exacerbation, AKI and hyperkalemia.  PMH significant of HTN, HLD, CAD, PAD. CXR  Stable enlarged cardiac silhouette and chronic central vascular congestion, small bilateral pleural effusions, with left basilar atelectasis. Clinical Impression: Clinical Impression: Pt exhibited minimal pharyngeal dysphagia without aspiration during study. His strength and efficiency of swallow was within normal limits. There were 2 instances of mistimed swallow where barium spilled over epiglottis prior to initiation but ejected during the swallow (PAS 2) and barium spilled to pyriform sinuses and into laryngeal vestibule before laryngeal closure (PAS 3). Verbal cues to clear throat removed penetrate. Minimal vallecular residue with thin barium x 2 throughout study with spontaneous swallow cleared. Orally he controlled barium pill with thin to transit to posterior oral cavity through PES and timely transit through GE junction. Straw use was effective with thin. His DIGEST score is a 1 (mild impairment). Pt may have instances where he coughs during meals however additional strategies not needed other than general aspiration precautions with proper positioning and small sips. Recommend he continue regular texture, thin liquids, pills (one at a time) with thin. No further ST is needed. DIGEST Swallow Severity Rating*  Safety:  Efficiency:  Overall Pharyngeal Swallow Severity: 1: mild; 2: moderate; 3: severe; 4: profound *The Dynamic Imaging Grade of Swallowing Toxicity is standardized for the head and neck cancer population, however, demonstrates promising clinical  applications across populations to standardize the clinical rating of pharyngeal swallow safety and severity. Factors that may increase risk of adverse event in presence of aspiration Rubye Oaks & Clearance Coots 2021): No data recorded Recommendations/Plan: Swallowing Evaluation Recommendations Swallowing Evaluation Recommendations Recommendations: PO diet PO Diet Recommendation: Regular; Thin liquids (Level 0) Liquid Administration via: Straw; Cup Medication Administration: Whole meds with liquid (one at a time) Supervision: Staff to assist with self-feeding; Other (comment) (due to tremors) Swallowing strategies  : Slow rate; Small bites/sips Postural changes: Position pt fully upright for meals Oral care recommendations: Oral care BID (2x/day) Treatment Plan Treatment Plan Treatment recommendations: No treatment recommended at this time Follow-up recommendations: No SLP follow up Functional status assessment: Patient has not had a  recent decline in their functional status. Recommendations Recommendations for follow up therapy are one component of a multi-disciplinary discharge planning process, led by the attending physician.  Recommendations may be updated based on patient status, additional functional criteria and insurance authorization. Assessment: Orofacial Exam: Orofacial Exam Oral Cavity: Oral Hygiene: WFL Oral Cavity - Dentition: Missing dentition (missing several lower) Orofacial Anatomy: WFL Oral Motor/Sensory Function: WFL Anatomy: Anatomy: WFL Boluses Administered: Boluses Administered Boluses Administered: Thin liquids (Level 0); Mildly thick liquids (Level 2, nectar thick); Moderately thick liquids (Level 3, honey thick); Puree; Solid  Oral Impairment Domain: Oral Impairment Domain Lip Closure: No labial escape Tongue control during bolus hold: Cohesive bolus between tongue to palatal seal Bolus preparation/mastication: Timely and efficient chewing and mashing Bolus transport/lingual motion: Brisk tongue  motion Oral residue: Trace residue lining oral structures Location of oral residue : Tongue Initiation of pharyngeal swallow : Valleculae  Pharyngeal Impairment Domain: Pharyngeal Impairment Domain Soft palate elevation: No bolus between soft palate (SP)/pharyngeal wall (PW) Laryngeal elevation: Complete superior movement of thyroid cartilage with complete approximation of arytenoids to epiglottic petiole Anterior hyoid excursion: Complete anterior movement Epiglottic movement: Complete inversion Laryngeal vestibule closure: Complete, no air/contrast in laryngeal vestibule Pharyngeal stripping wave : Present - complete Pharyngeal contraction (A/P view only): N/A Pharyngoesophageal segment opening: Complete distension and complete duration, no obstruction of flow Tongue base retraction: No contrast between tongue base and posterior pharyngeal wall (PPW) Pharyngeal residue: Collection of residue within or on pharyngeal structures (with thin) Location of pharyngeal residue: Valleculae (with thin)  Esophageal Impairment Domain: Esophageal Impairment Domain Esophageal clearance upright position: Complete clearance, esophageal coating Pill: No data recorded Penetration/Aspiration Scale Score: Penetration/Aspiration Scale Score 1.  Material does not enter airway: Mildly thick liquids (Level 2, nectar thick); Moderately thick liquids (Level 3, honey thick); Puree; Solid; Pill 2.  Material enters airway, remains ABOVE vocal cords then ejected out: Thin liquids (Level 0) 3.  Material enters airway, remains ABOVE vocal cords and not ejected out: Thin liquids (Level 0) Compensatory Strategies: Compensatory Strategies Compensatory strategies: Yes Straw: Effective Effective Straw: Thin liquid (Level 0)   General Information: Caregiver present: No  Diet Prior to this Study: Regular; Thin liquids (Level 0)   Temperature : Normal   Respiratory Status: WFL   Supplemental O2: None (Room air)   History of Recent Intubation: No   Behavior/Cognition: Alert; Cooperative; Pleasant mood Self-Feeding Abilities: Needs assist with self-feeding (due to tremors) Baseline vocal quality/speech: Normal Volitional Cough: Able to elicit Volitional Swallow: Able to elicit Exam Limitations: No limitations Goal Planning: Prognosis for improved oropharyngeal function: Good No data recorded No data recorded No data recorded Consulted and agree with results and recommendations: Patient; Physician; Nurse Pain: Pain Assessment Pain Assessment: Faces Faces Pain Scale: 0 End of Session: Start Time:SLP Start Time (ACUTE ONLY): 1236 Stop Time: SLP Stop Time (ACUTE ONLY): 1249 Time Calculation:SLP Time Calculation (min) (ACUTE ONLY): 13 min Charges: SLP Evaluations $ SLP Speech Visit: 1 Visit SLP Evaluations $BSS Swallow: 1 Procedure $MBS Swallow: 1 Procedure SLP visit diagnosis: SLP Visit Diagnosis: Dysphagia, pharyngeal phase (R13.13) Past Medical History: Past Medical History: Diagnosis Date  AAA (abdominal aortic aneurysm) (HCC)   asymptomatic  AAA (abdominal aortic aneurysm) (HCC) 2010  peripheral  angiogram-- bilateral SFA DISEASE  and 60 to 70% infrarenaal abd. aortic stenosis with 15 -mm gradient  Adenomatous colon polyp 11/2003  CAD (coronary artery disease)   Gait abnormality 02/13/2017  Gastroparesis   pt denies  GERD (gastroesophageal reflux disease)  w/ LPR  History of kidney stones   x2  Hyperlipidemia   Hypertension   IDA (iron deficiency anemia)   Peripheral arterial disease (HCC)   RCEA  by Dr [name]  PUD (peptic ulcer disease)   pt unaware  Vertigo  Past Surgical History: Past Surgical History: Procedure Laterality Date  ABDOMINAL AORTOGRAM W/LOWER EXTREMITY Bilateral 08/05/2018  Procedure: ABDOMINAL AORTOGRAM W/LOWER EXTREMITY;  Surgeon: Runell Gess, MD;  Location: [facility] IN[facility]ASIVE [facility];  Service: Cardiovascular;  Laterality: Bilateral;  ABDOMINAL AORTOGRAM W/LOWER EXTREMITY N/A 09/26/2021  Procedure: ABDOMINAL AORTOGRAM W/LOWER EXTREMITY;   Surgeon: Runell Gess, MD;  Location: [facility] INVASIVE CV [facility];  Service: Cardiovascular;  Laterality: N/A;  AORTOGRAM  04/24/2016   Abdominal aortogram, bilateral iliac angiogram, bifemoral runoff  CARDIAC CATHETERIZATION  05/12/2005  RCA  carotid doppler  01/24/2013  RICA endarterectomy,left CCA 0-49%; left bulb and prox ICA 50-69%; bilaateral subclavian < 50%  CAROTID ENDARTERECTOMY  11/01/2010  CATARACT EXTRACTION    COLONOSCOPY    CORONARY ANGIOPLASTY  05/18/2005  2 STENTS distal RCA AND PROXIMAL-MID RCA   5 total stents per pt.  CYSTOSCOPY/URETEROSCOPY/HOLMIUM LASER/STENT PLACEMENT Left 01/11/2018  Procedure: LEFT URETEROSCOPY/HOLMIUM LASER/STENT PLACEMENT;  Surgeon: Crist Fat, MD;  Location: [facility];  Service: Urology;  Laterality: Left;  DOPPLER ECHOCARDIOGRAPHY  02/07/2012  EF 55%,SHOWED NO ISCHEMIA   HERNIA REPAIR    umbilical  lower arterial  doppler  02/04/2013  aotra 1.5 x 1.5 cm; distal abd aorta 70-99%,proximal common iliac arteries -very stenotic with increased velocities>50%,may be falsely elevated as a result of residual plaque from the distal aorta stenosis  lower extremity doppler  June 18 ,2013  ABI'S ABNORMAL, RABI was 0.88 and LABI 0.75 ,with 3-vessel  run off  NM MYOVIEW LTD  MAY 23,2011  showed no significant ischemia;  NM MYOVIEW LTD  04/22/2008  ef 77%,exercise capcity 6 METS ,exaggerated blood pressure response to exercise  PERIPHERAL VASCULAR CATHETERIZATION N/A 04/24/2016  Procedure: Lower Extremity Angiography;  Surgeon: Runell Gess, MD;  Location[facility] IN[facility]AS[facility] CV [facility];  Service: Cardiovascular;  Laterality: N/A;  PERIPHERAL VASCULAR CATHETERIZATION  04/24/2016  Procedure: Peripheral Vascular Intervention;  Surgeon: Runell Gess, MD;  Location: [facility] INVASIVE CV LAB;  Service: Cardiovascular;;  Aorta  retrograde central aortic catheterization  05/19/2005  cutting balloon atherectomy, c-circ stenosis with DES STENTING CYPHER  TONSILLECTOMY    TOTAL HIP ARTHROPLASTY Right 07/11/2022   Procedure: TOTAL HIP ARTHROPLASTY ANTERIOR APPROACH;  Surgeon: Durene Romans, MD;  Location: [facility];  Service: Orthopedics;  Laterality: Right;  TOTAL KNEE ARTHROPLASTY Left 01/03/2019  Procedure: TOTAL KNEE ARTHROPLASTY;  Surgeon: Beverely Low, MD;  Location: [facility];  Service: Orthopedics;  Laterality: Left;  with IS block  TRANSURETHRAL RESECTION OF PROSTATE N/A 09/08/2016  Procedure: TRANSURETHRAL RESECTION OF THE PROSTATE (TURP);  Surgeon: Crist Fat, MD;  Location: [facility];  Service: Urology;  Laterality: N/A;  TRANSURETHRAL RESECTION OF PROSTATE    10-10-17  Dr. Marlou Porch  TRANSURETHRAL RESECTION OF PROSTATE N/A 10/10/2017  Procedure: TRANSURETHRAL RESECTION OF THE PROSTATE (TURP);  Surgeon: Crist Fat, MD;  Location: [facility];  Service: Urology;  Laterality: N/A;  VASECTOMY   Royce Macadamia 09/28/2023, 2:01 PM   Cardiac Studies   Echo 09/14/23:  1. Left ventricular ejection fraction, by estimation, is 60 to 65%. Left  ventricular ejection fraction by 3D volume is 63 %. The left ventricle has  normal function. The left ventricle has no regional wall motion  abnormalities. There  is mild concentric  left ventricular hypertrophy. Left ventricular diastolic parameters are  consistent with Grade I diastolic dysfunction (impaired relaxation). The  average left ventricular global longitudinal strain is -24.2 %. The global  longitudinal strain is normal.   2. Right ventricular systolic function is normal. The right ventricular  size is normal.   3. Left atrial size was mildly dilated.   4. The mitral valve is normal in structure. Trivial mitral valve  regurgitation. No evidence of mitral stenosis.   5. The aortic valve is tricuspid. There is mild calcification of the  aortic valve. Aortic valve regurgitation is not visualized. Aortic valve  sclerosis/calcification is present, without any evidence of aortic  stenosis.   6. The inferior vena cava is normal in size with <50% respiratory   variability, suggesting right atrial pressure of 8 mmHg.   Patient Profile     89 y.o. male with a hx of CAD remote stening 2006, AAA, hypertension, hyperlipidemia, carotid artery disease CEA 2013, and PAD who is being seen 09/26/2023 for the evaluation of CHF and now new onset Afib in the setting of confusion and UTI.   Assessment & Plan    New onset A-fib with RVR; in the setting of UTI Now converted to sinus rhythm.  Continue beta-blocker and IV PRN Continue weight-based Lovenox, did not want to do heparin with frequent blood draws Can transition to Eliquis and stop home plavix considering PCI was remote once he is close to discharge  CAD status post PCI in 2006; PAD -On Plavix and Lipitor  UTI -On Rocephin  HTN BP is up In the setting of left renal artery stenosis 90% at the ostium -On Coreg 12.5 twice daily and home imdur 10 mg TID, 50 mg hydralazine TID, 0.1 gm clonidine TID, restarted his home Norvasc 10 mg daily -Agree with IV hydralazine.  HFpEF Status post IV Lasix Now more euvolemic  For questions or updates, please contact [facility] Please consult [email or web address] for contact info under        Signed, Maisie Fus, MD  09/29/2023, 11:58 AM

## 2023-09-29 NOTE — Plan of Care (Signed)
  Problem: Education: Goal: Knowledge of General Education information will improve Description: Including pain rating scale, medication(s)/side effects and non-pharmacologic comfort measures Outcome: Progressing   Problem: Clinical Measurements: Goal: Ability to maintain clinical measurements within normal limits will improve Outcome: Progressing   Problem: Clinical Measurements: Goal: Will remain free from infection Outcome: Progressing   Problem: Clinical Measurements: Goal: Diagnostic test results will improve Outcome: Progressing   Problem: Clinical Measurements: Goal: Cardiovascular complication will be avoided Outcome: Progressing   Problem: Activity: Goal: Risk for activity intolerance will decrease Outcome: Progressing

## 2023-09-30 DIAGNOSIS — E8779 Other fluid overload: Secondary | ICD-10-CM | POA: Diagnosis not present

## 2023-09-30 LAB — CBC
HCT: 30.9 % — ABNORMAL LOW (ref 39.0–52.0)
Hemoglobin: 10 g/dL — ABNORMAL LOW (ref 13.0–17.0)
MCH: 29.2 pg (ref 26.0–34.0)
MCHC: 32.4 g/dL (ref 30.0–36.0)
MCV: 90.4 fL (ref 80.0–100.0)
Platelets: 184 10*3/uL (ref 150–400)
RBC: 3.42 MIL/uL — ABNORMAL LOW (ref 4.22–5.81)
RDW: 12.5 % (ref 11.5–15.5)
WBC: 7.1 10*3/uL (ref 4.0–10.5)
nRBC: 0 % (ref 0.0–0.2)

## 2023-09-30 LAB — BASIC METABOLIC PANEL
Anion gap: 11 (ref 5–15)
BUN: 37 mg/dL — ABNORMAL HIGH (ref 8–23)
CO2: 24 mmol/L (ref 22–32)
Calcium: 8.8 mg/dL — ABNORMAL LOW (ref 8.9–10.3)
Chloride: 107 mmol/L (ref 98–111)
Creatinine, Ser: 1.37 mg/dL — ABNORMAL HIGH (ref 0.61–1.24)
GFR, Estimated: 49 mL/min — ABNORMAL LOW (ref >60)
Glucose, Bld: 111 mg/dL — ABNORMAL HIGH (ref 70–99)
Potassium: 3.4 mmol/L — ABNORMAL LOW (ref 3.5–5.1)
Sodium: 142 mmol/L (ref 135–145)

## 2023-09-30 MED ORDER — ERYTHROMYCIN 5 MG/GM OP OINT
TOPICAL_OINTMENT | Freq: Three times a day (TID) | OPHTHALMIC | Status: DC
  Administered 2023-09-30: 1 via OPHTHALMIC
  Filled 2023-09-30: qty 3.5

## 2023-09-30 MED ORDER — POTASSIUM CHLORIDE CRYS ER 20 MEQ PO TBCR
40.0000 meq | EXTENDED_RELEASE_TABLET | Freq: Once | ORAL | Status: AC
  Administered 2023-09-30: 40 meq via ORAL
  Filled 2023-09-30: qty 2

## 2023-09-30 NOTE — Plan of Care (Signed)

## 2023-09-30 NOTE — Progress Notes (Signed)
 [facility] PROGRESS NOTE  Nathan Santiago (DOB: [birth year]) WGN:[id number] PCP: Mahlon Gammon, MD  Brief Narrative: Nathan Santiago is a 89 y.o. male with a history of CAD s/p stents 2006, AAA, renal artery stenosis, extensive PVD, HTN, HLD, chronic back pain who presented to the ED on 09/25/2023 from nursing facility due to lethargy. He was noted to be volume overloaded on exam with elevated BNP, and CXR with cardiomegaly, central vascular congestion, and pleural effusion. Lasix was administered, cardiology consulted, and patient admitted for suspected acute on chronic HFpEF complicated by AKI. He has exhibited significant delirium and on 2/27 developed fever to 101F. CTX given for UTI. AFib with RVR 2/28, converted with amiodarone load to NSR. Mentation improving.   Subjective: States he lives at home with his wife, doesn't know where he is. Has no chest pain, dyspnea, palpitations, or other complaints. He'd like to watch a cowboy movie.  Objective: BP (!) 152/46 (BP Location: Left Arm)   Pulse 66   Temp 98.4 F (36.9 C) (Oral)   Resp 20   Ht 5\' 4"  (1.626 m)   Wt 78.6 kg   SpO2 95%   BMI 29.74 kg/m   Gen: Elderly male in no distress Pulm: Clear, nonlabored  CV: NSR on monitor, rate in 60's, no pitting edema or JVD GI: Soft, NT, ND, +BS Neuro: Alert and interactive, incompletely oriented but normal speed of speech/mentation. No new focal deficits. Ext: Warm, no deformities Skin: No new rashes, lesions or ulcers on visualized skin   Assessment & Plan: Acute metabolic encephalopathy, acute delirium: Suspect hospital delirium and UTI-related encephalopathy. Has improved considerably. - Delirium precautions, Tx UTI  Pseudomonas UTI:   - Monitor blood cultures (NGTD) and urine Cx susceptibilities (still pending), change ceftriaxone to cefepime (monitor mentation on this), given improvement may abbreviate course.  New diagnosis of paroxysmal atrial fibrillation with RVR:  Converted quickly to NSR with amiodarone.  - Continue coreg and prn metoprolol.  - CHA2DS2-VASc score is 5 (CHF, HTN, atherosclerosis, age [2]), cardiology started lovenox, continue at 1mg /kg q12h pending further plans  Hypokalemia:  - Supplement and monitor, check Mg in AM.  Acute on chronic HFpEF, HTN: Appeared to be volume overloaded initially. His weight is below outpatient clinic weight, however.  - Appreciate cardiology recommendations, holding further diuresis. Appears roughly euvolemic.   - Continue isosorbide/hydralazine, clonidine, norvasc, coreg.  AKI on stage IIIb CKD:  - Holding thiazide diuretic while giving loop diuretic. Continues improving. - GDMT deferred to cardiology  BPH:  - No retention noted. Continue tamsulosin/finasteride  PVD, CAD s/p stents, renal artery stenosis: Not a candidate for common femoral endarterectomy per vascular surgery. Also not planning on intervention for renal artery stenosis.  - Continue plavix, statin, anticoagulation started as above. Per cardiology, could consider DC plavix given remoteness of stent  Deconditioning, debility:  - PT/OT, CSW consulted. Plan disposition back to SNF, likely tomorrow if cleared by cardiology.   Iron deficiency anemia/anemia CKD:  - Continue Fe supplement.  DNR: POA.   Tyrone Nine, MD [facility] [email or web address] 09/30/2023, 8:31 AM

## 2023-09-30 NOTE — Plan of Care (Signed)

## 2023-10-01 ENCOUNTER — Telehealth (HOSPITAL_COMMUNITY): Payer: Self-pay | Admitting: Pharmacy Technician

## 2023-10-01 ENCOUNTER — Other Ambulatory Visit (HOSPITAL_COMMUNITY): Payer: Self-pay

## 2023-10-01 DIAGNOSIS — I5031 Acute diastolic (congestive) heart failure: Secondary | ICD-10-CM

## 2023-10-01 DIAGNOSIS — I480 Paroxysmal atrial fibrillation: Secondary | ICD-10-CM

## 2023-10-01 DIAGNOSIS — I4891 Unspecified atrial fibrillation: Secondary | ICD-10-CM | POA: Diagnosis not present

## 2023-10-01 DIAGNOSIS — I739 Peripheral vascular disease, unspecified: Secondary | ICD-10-CM

## 2023-10-01 DIAGNOSIS — E8779 Other fluid overload: Secondary | ICD-10-CM | POA: Diagnosis not present

## 2023-10-01 DIAGNOSIS — I2510 Atherosclerotic heart disease of native coronary artery without angina pectoris: Secondary | ICD-10-CM | POA: Diagnosis not present

## 2023-10-01 LAB — BASIC METABOLIC PANEL
Anion gap: 10 (ref 5–15)
BUN: 32 mg/dL — ABNORMAL HIGH (ref 8–23)
CO2: 24 mmol/L (ref 22–32)
Calcium: 8.8 mg/dL — ABNORMAL LOW (ref 8.9–10.3)
Chloride: 106 mmol/L (ref 98–111)
Creatinine, Ser: 1.16 mg/dL (ref 0.61–1.24)
GFR, Estimated: 60 mL/min — ABNORMAL LOW (ref >60)
Glucose, Bld: 102 mg/dL — ABNORMAL HIGH (ref 70–99)
Potassium: 3.6 mmol/L (ref 3.5–5.1)
Sodium: 140 mmol/L (ref 135–145)

## 2023-10-01 LAB — MAGNESIUM: Magnesium: 2.2 mg/dL (ref 1.7–2.4)

## 2023-10-01 MED ORDER — APIXABAN 5 MG PO TABS: 5.0000 mg | ORAL_TABLET | Freq: Two times a day (BID) | ORAL | Status: DC

## 2023-10-01 MED ORDER — KETOROLAC TROMETHAMINE 0.5 % OP SOLN
1.0000 [drp] | Freq: Four times a day (QID) | OPHTHALMIC | Status: DC
  Administered 2023-10-01: 1 [drp] via OPHTHALMIC
  Filled 2023-10-01: qty 5

## 2023-10-01 MED ORDER — KETOROLAC TROMETHAMINE 0.5 % OP SOLN: 1.0000 [drp] | Freq: Four times a day (QID) | OPHTHALMIC | Status: DC

## 2023-10-01 MED ORDER — DIPHENHYDRAMINE HCL 25 MG PO CAPS
25.0000 mg | ORAL_CAPSULE | Freq: Four times a day (QID) | ORAL | Status: DC | PRN
  Administered 2023-10-01: 25 mg via ORAL
  Filled 2023-10-01: qty 1

## 2023-10-01 MED ORDER — APIXABAN 5 MG PO TABS
5.0000 mg | ORAL_TABLET | Freq: Two times a day (BID) | ORAL | Status: DC
  Administered 2023-10-01: 5 mg via ORAL
  Filled 2023-10-01: qty 1

## 2023-10-01 NOTE — Progress Notes (Signed)
 On call contacted overnight re: itching eyes, see new orders

## 2023-10-01 NOTE — TOC Transition Note (Signed)
 Transition of Care [facility]) - Discharge Note   Patient Details  Name: Nathan Santiago MRN: [id number] Date of Birth: 06-11-1931  Transition of [facility]) CM/SW Contact:  Michaela Corner, LCSWA Phone Number: 10/01/2023, 10:26 AM   Clinical Narrative:   Patient will DC to: [facility] Anticipated DC date: 10/01/23 Family notified: Nelva Bush (spouse) Transport by: Sharin Mons   Per MD patient ready for DC to [facility]. RN to call report prior to discharge [phone]; room 149). RN, patient, patient's family, and facility notified of DC. Discharge Summary and FL2 sent to facility. DC packet on chart. Ambulance transport requested for patient.   CSW will sign off for now as social work intervention is no longer needed. Please consult [place] again if new needs arise.  Final next level of care: Skilled Nursing Facility Barriers to Discharge: Barriers Resolved   Patient Goals and CMS Choice Patient states their goals for this hospitalization and ongoing recovery are:: return to [facility] SN[facility]          Discharge Placement              Patient chooses bed at: [facility] Patient to be transferred to facility by: [facility]tar Name of family member notified: Nelva Bush (spouse) Patient and family notified of of transfer: 10/01/23  Discharge Plan and Services Additional resources added to the After Visit Summary for     Discharge Planning Services: CM Consult Post Acute Care Choice: Skilled Nursing Facility          DME Arranged: N/A DME Agency: NA       HH Arranged: NA          Social Drivers of Health (SDOH) Interventions SDOH Screenings   Food Insecurity: No Food Insecurity (09/26/2023)  Housing: Low Risk  (09/26/2023)  Transportation Needs: No Transportation Needs (09/26/2023)  Utilities: Not At Risk (09/26/2023)  Alcohol Screen: Low Risk  (11/28/2022)  Depression (PHQ2-9): Low Risk  (07/13/2023)  Financial Resource Strain: Low Risk  (11/28/2022)  Physical Activity: Insufficiently Active  (11/28/2022)  Social Connections: Socially Integrated (09/26/2023)  Stress: No Stress Concern Present (11/28/2022)  Tobacco Use: Medium Risk (09/26/2023)     Readmission Risk Interventions    07/10/2022    2:59 PM  Readmission Risk Prevention Plan  Post Dischage Appt Complete  Medication Screening Complete  Transportation Screening Complete

## 2023-10-01 NOTE — Care Management Important Message (Signed)
 Important Message  Patient Details  Name: Nathan Santiago MRN: [id number] Date of Birth: [birth year]   Important Message Given:  Yes - Medicare IM     Renie Ora 10/01/2023, 11:06 AM

## 2023-10-01 NOTE — Progress Notes (Signed)
 Physical Therapy Treatment Patient Details Name: Nathan Santiago MRN: [id number] DOB: [birth year] Today's Date: 10/01/2023   History of Present Illness Nathan Santiago is a 89 y.o. male admitted 09/25/23 d/t lethargy. He was noted to be volume overloaded on exam with elevated BNP, and CXR with cardiomegaly, central vascular congestion, and pleural effusion. PMH of CAD s/p stents 2006, AAA, renal artery stenosis, extensive PVD, HTN, HLD, and chronic back pain.    PT Comments  Pt greeted in recliner chair, pleasant and agreeable to PT session. He was A,Ox4 and able to follow simple one step commands with increased time. Pt engaged in transfer/gait training and standing balance re-education with min-modA x2 using RW. Pt was able to perform multiple bouts of STS followed by prolonged static stance with emphasis placed on decreasing his posterior lean. He completed forward and backward steps with multiple gait abnormalities. Will continue to follow acutely and advance appropriately.    If plan is discharge home, recommend the following: Two people to help with walking and/or transfers;A lot of help with bathing/dressing/bathroom;Supervision due to cognitive status;Assist for transportation;Help with stairs or ramp for entrance;Direct supervision/assist for medications management;Direct supervision/assist for financial management;Assistance with cooking/housework   Can travel by private vehicle     No  Equipment Recommendations  None recommended by PT (Pt already has DME)    Recommendations for Other Services       Precautions / Restrictions Precautions Precautions: Fall Recall of Precautions/Restrictions: Impaired Restrictions Weight Bearing Restrictions Per Provider Order: No     Mobility  Bed Mobility               General bed mobility comments: Not observed. Pt greeted in recliner chair at start of session and returned there at end of session.    Transfers Overall transfer  level: Needs assistance Equipment used: Rolling walker (2 wheels) Transfers: Sit to/from Stand Sit to Stand: Min assist, +2 physical assistance, +2 safety/equipment           General transfer comment: STS from recliner chair x10 reps. Cued pt to scoot fwd til B feet were supported. Educated pt on proper sequencing using RW. He pushed up with BUE from recliner chair and would transition to hold onto RW with VC. He demonstrated good eccentric control, reaching back to the surface he was going to.    Ambulation/Gait Ambulation/Gait assistance: Mod assist, +2 physical assistance, +2 safety/equipment Gait Distance (Feet): 2 Feet (x2) Assistive device: Rolling walker (2 wheels) Gait Pattern/deviations: Step-to pattern, Wide base of support, Leaning posteriorly   Gait velocity interpretation: <1.31 ft/sec, indicative of household ambulator   General Gait Details: Pt engaged in fwd/bkwd stepping in front of recliner chair. He took short shuffling steps with no floor clearence and inability to pass the opposite LE even with VC/TC. Pt maintained a posterior lean, multi-modal cueing utilized to correct with no change observed during session.   Stairs             Wheelchair Mobility     Tilt Bed    Modified Rankin (Stroke Patients Only)       Balance Overall balance assessment: Needs assistance Sitting-balance support: Bilateral upper extremity supported Sitting balance-Leahy Scale: Fair Sitting balance - Comments: Pt sat in recliner chair with supervision. Able to scoot fwd/bkwd with BUE support on armrests. Postural control: Posterior lean Standing balance support: Bilateral upper extremity supported, During functional activity, Reliant on assistive device for balance Standing balance-Leahy Scale: Poor Standing balance comment: Pt is dependent  on RW for stability and maintains a posterior lean requiring modA x2 for safety when upright. He is unable to achieve a neutral posture  at this time, even with prolonged static stance and multi-modal cueing.                            Communication Communication Communication: No apparent difficulties  Cognition Arousal: Alert Behavior During Therapy: WFL for tasks assessed/performed   PT - Cognitive impairments: Sequencing, Safety/Judgement                       PT - Cognition Comments: Pt A,Ox4. Following commands: Impaired Following commands impaired: Follows one step commands with increased time    Cueing Cueing Techniques: Verbal cues, Tactile cues, Visual cues  Exercises      General Comments General comments (skin integrity, edema, etc.): VSS on RA      Pertinent Vitals/Pain Pain Assessment Pain Assessment: No/denies pain    Home Living                          Prior Function            PT Goals (current goals can now be found in the care plan section) Acute Rehab PT Goals Patient Stated Goal: Return Home to my wife Progress towards PT goals: Progressing toward goals    Frequency    Min 1X/week      PT Plan      Co-evaluation              AM-PAC PT "6 Clicks" Mobility   Outcome Measure  Help needed turning from your back to your side while in a flat bed without using bedrails?: A Little Help needed moving from lying on your back to sitting on the side of a flat bed without using bedrails?: A Little Help needed moving to and from a bed to a chair (including a wheelchair)?: Total Help needed standing up from a chair using your arms (e.g., wheelchair or bedside chair)?: Total Help needed to walk in hospital room?: Total Help needed climbing 3-5 steps with a railing? : Total 6 Click Score: 10    End of Session Equipment Utilized During Treatment: Gait belt Activity Tolerance: Patient tolerated treatment well Patient left: in chair;with call bell/phone within reach;with chair alarm set Nurse Communication: Mobility status PT Visit Diagnosis:  Muscle weakness (generalized) (M62.81);History of falling (Z91.81);Unsteadiness on feet (R26.81);Other abnormalities of gait and mobility (R26.89)     Time: 1610-9604 PT Time Calculation (min) (ACUTE ONLY): 24 min  Charges:    $Therapeutic Activity: 8-22 mins $Neuromuscular Re-education: 8-22 mins PT General Charges $$ ACUTE PT VISIT: 1 Visit                     Cheri Guppy, PT, DPT Acute Rehabilitation Services Office: [phone] Secure Chat Preferred  Richardson Chiquito 10/01/2023, 12:57 PM

## 2023-10-01 NOTE — Discharge Summary (Signed)
 Physician Discharge Summary   Patient: Nathan Santiago MRN: [id number] DOB: 1931-07-01  Admit date:     09/25/2023  Discharge date: 10/01/23  Discharge Physician: Tyrone Nine   PCP: Mahlon Gammon, MD   Recommendations at discharge:  Follow up with cardiology as scheduled. Note acute illness (UTI) revealed new onset atrial fibrillation and CHF which has converted/resolved. Per cardiology, we are stopping plavix and starting eliquis for stroke prevention, and continuing his CHF/HTN regimen. He has received 3 days of effective antibiotics for UTI as discussed below with resolution of symptoms.   Discharge Diagnoses: Principal Problem:   Fluid overload Active Problems:   Urinary tract infection without hematuria   AKI (acute kidney injury) (HCC)   Acute on chronic diastolic heart failure (HCC)   Encephalopathy   Atrial fibrillation with rapid ventricular response [facility])  Hospital Course: [name] is a 89 y.o. male with a history of CAD s/p stents 2006, AAA, renal artery stenosis, extensive PVD, HTN, HLD, chronic back pain who presented to the ED on 09/25/2023 from nursing facility due to lethargy. He was noted to be volume overloaded on exam with elevated BNP, and CXR with cardiomegaly, central vascular congestion, and pleural effusion. Lasix was administered, cardiology consulted, and patient admitted for suspected acute on chronic HFpEF complicated by AKI. He has exhibited significant delirium and on 2/27 developed fever to 101F. CTX given for UTI. AFib with RVR 2/28, converted with amiodarone load to NSR and remains in sinus rhythm on beta blocker with UTI treatment. Mentation normalized. He will complete the course of antibiotics for UTI prior to discharge, but has been cleared by the cardiology and medical teams for return to SNF on 3/3.   Assessment and Plan: Acute metabolic encephalopathy, acute delirium: Suspect hospital delirium and UTI-related encephalopathy. Has improved  considerably. - Delirium precautions, Tx UTI   Pseudomonas UTI:   - Monitor blood cultures from 2/27 (NGTD on 3/3).  - Complete 3 days of cefepime on 3/3. Has sustained clinical improvement, VSS, afebrile, WBC remains normal.     New diagnosis of paroxysmal atrial fibrillation with RVR: Converted quickly to NSR with amiodarone and infection Tx.  - Continue home metoprolol.  - CHA2DS2-VASc score is 5 (CHF, HTN, atherosclerosis, age [2]), start eliquis 5mg  BID.   Hypokalemia:  - Supplemented and resolved.    Acute on chronic HFpEF, HTN: Appeared to be volume overloaded initially. His weight is below outpatient clinic weight, however.  - Appreciate cardiology recommendations, holding further diuresis. Appears roughly euvolemic.   - Continue home Tx as decompensation was due to UTI/AFib.    AKI on stage IIIb CKD: Continued improvement. Suggest recheck at follow up.   BPH:  - No retention noted. Continue tamsulosin/finasteride   PVD, CAD s/p stents, renal artery stenosis: Not a candidate for common femoral endarterectomy per vascular surgery. Also not planning on intervention for renal artery stenosis.  - Continue statin, anticoagulation started as above. Per cardiology, will DC plavix given remoteness of stent   Deconditioning, debility:  - PT/OT, CSW consulted. Plan disposition back to SNF    Iron deficiency anemia/anemia CKD:  - Continue Fe supplement.   DNR: POA.   Consultants: Cardiology Procedures performed: None  Disposition: Skilled nursing facility Diet recommendation:  Cardiac diet DISCHARGE MEDICATION: Allergies as of 10/01/2023       Reactions   Lisinopril Cough   Niaspan [niacin Er (antihyperlipidemic)] Rash        Medication List  STOP taking these medications    clopidogrel 75 MG tablet Commonly known as: PLAVIX   furosemide 20 MG tablet Commonly known as: LASIX   HYDROcodone bit-homatropine 5-1.5 MG/5ML syrup Commonly known as: HYCODAN        TAKE these medications    acetaminophen 500 MG tablet Commonly known as: TYLENOL Take 1 tablet (500 mg total) by mouth 2 (two) times daily as needed.   amLODipine 10 MG tablet Commonly known as: NORVASC Take 10 mg by mouth daily.   apixaban 5 MG Tabs tablet Commonly known as: ELIQUIS Take 1 tablet (5 mg total) by mouth 2 (two) times daily.   atorvastatin 40 MG tablet Commonly known as: LIPITOR Take 40 mg by mouth daily at 6 PM.   Biotin 10 MG Tabs Take 10 mg by mouth in the morning.   cetirizine 10 MG tablet Commonly known as: ZYRTEC Take 1 tablet (10 mg total) by mouth at bedtime.   chlorthalidone 50 MG tablet Commonly known as: HYGROTON Take 50 mg by mouth in the morning. Hold if BP < 95   cholecalciferol 25 MCG (1000 UNIT) tablet Commonly known as: VITAMIN D3 Take 1,000 Units by mouth in the morning.   cloNIDine 0.1 MG tablet Commonly known as: Catapres Take 1 tablet (0.1 mg total) by mouth 3 (three) times daily. What changed: additional instructions   finasteride 5 MG tablet Commonly known as: PROSCAR Take 5 mg by mouth in the morning.   gabapentin 100 MG capsule Commonly known as: NEURONTIN Take 100 mg by mouth 3 (three) times daily.   Glucosamine-Chondroitin 750-600 MG Tabs Take 1 tablet by mouth 2 (two) times daily.   hydrALAZINE 25 MG tablet Commonly known as: APRESOLINE Take 25 mg by mouth in the morning and at bedtime. Hold if BP < 95   hydrALAZINE 50 MG tablet Commonly known as: APRESOLINE Take 50 mg by mouth every evening. Hold if BP <95   iron polysaccharides 150 MG capsule Commonly known as: NIFEREX Take 150 mg by mouth every Monday, Wednesday, and Friday.   ketorolac 0.5 % ophthalmic solution Commonly known as: ACULAR Place 1 drop into both eyes 4 (four) times daily.   losartan 50 MG tablet Commonly known as: COZAAR Take 50 mg by mouth 2 (two) times daily.   melatonin 5 MG Tabs Take 1 tablet (5 mg total) by mouth at bedtime.    metoprolol tartrate 50 MG tablet Commonly known as: LOPRESSOR Take 75 mg by mouth 2 (two) times daily.   multivitamin with minerals Tabs tablet Take 1 tablet by mouth in the morning.   pantoprazole 40 MG tablet Commonly known as: PROTONIX Take 40 mg by mouth 2 (two) times daily.   potassium chloride SA 20 MEQ tablet Commonly known as: KLOR-CON M Take 20 mEq by mouth every evening.   PREVAGEN PO Take 1 tablet by mouth in the morning.   tamsulosin 0.4 MG Caps capsule Commonly known as: FLOMAX Take 1 capsule (0.4 mg total) by mouth daily after supper.        Follow-up Information     Mahlon Gammon, MD. Schedule an appointment as soon as possible for a visit.   Specialty: Internal Medicine Contact information: [address] [place] [address] [phone]         Runell Gess, MD Follow up on 11/21/2023.   Specialties: Cardiology, Radiology Contact information: [address] [place] [address] [phone]  Discharge Exam: Filed Weights   09/29/23 0515 09/30/23 0409 10/01/23 0423  Weight: 79.9 kg 78.6 kg 77.8 kg  BP (!) 149/43 (BP Location: Left Arm)   Pulse 80   Temp 97.9 F (36.6 C) (Oral)   Resp 19   Ht 5\' 4"  (1.626 m)   Wt 77.8 kg   SpO2 97%   BMI 29.46 kg/m   Alert, oriented, jovial male in no acute distress Clear, nonlabored RRR, no MRG, no pitting edema  Condition at discharge: stable  The results of significant diagnostics from this hospitalization (including imaging, microbiology, ancillary and laboratory) are listed below for reference.   Imaging Studies: DG CHEST PORT 1 VIEW Result Date: 09/28/2023 CLINICAL DATA:  Cough EXAM: PORTABLE CHEST 1 VIEW COMPARISON:  X-ray 09/25/2023 FINDINGS: No consolidation, pneumothorax or edema. Tiny left pleural effusion with blunting of the left costophrenic angle. Normal cardiopericardial silhouette. Calcified aorta. Overlapping cardiac leads.  Degenerative changes of the spine. IMPRESSION: Tiny left effusion versus pleural thickening. No consolidation or edema Electronically Signed   By: Karen Kays M.D.   On: 09/28/2023 16:40   DG Swallowing Func-Speech Pathology Result Date: 09/28/2023 Table formatting from the original result was not included. Modified Barium Swallow Study Patient Details Name: BURNARD ENIS MRN: [id number] Date of Birth: 03/02/1931 Today's Date: 09/28/2023 HPI/PMH: HPI: Pt is a 89 y/o male presenting with lethargy, weakness and lower extremity edema. Pt admitted for acute CHF exacerbation, AKI and hyperkalemia.  PMH significant of HTN, HLD, CAD, PAD. CXR  Stable enlarged cardiac silhouette and chronic central vascular congestion, small bilateral pleural effusions, with left basilar atelectasis. Clinical Impression: Clinical Impression: Pt exhibited minimal pharyngeal dysphagia without aspiration during study. His strength and efficiency of swallow was within normal limits. There were 2 instances of mistimed swallow where barium spilled over epiglottis prior to initiation but ejected during the swallow (PAS 2) and barium spilled to pyriform sinuses and into laryngeal vestibule before laryngeal closure (PAS 3). Verbal cues to clear throat removed penetrate. Minimal vallecular residue with thin barium x 2 throughout study with spontaneous swallow cleared. Orally he controlled barium pill with thin to transit to posterior oral cavity through PES and timely transit through GE junction. Straw use was effective with thin. His DIGEST score is a 1 (mild impairment). Pt may have instances where he coughs during meals however additional strategies not needed other than general aspiration precautions with proper positioning and small sips. Recommend he continue regular texture, thin liquids, pills (one at a time) with thin. No further ST is needed. DIGEST Swallow Severity Rating*  Safety:  Efficiency:  Overall Pharyngeal Swallow Severity: 1:  mild; 2: moderate; 3: severe; 4: profound *The Dynamic Imaging Grade of Swallowing Toxicity is standardized for the head and neck cancer population, however, demonstrates promising clinical applications across populations to standardize the clinical rating of pharyngeal swallow safety and severity. Factors that may increase risk of adverse event in presence of aspiration Rubye Oaks & Clearance Coots 2021): No data recorded Recommendations/Plan: Swallowing Evaluation Recommendations Swallowing Evaluation Recommendations Recommendations: PO diet PO Diet Recommendation: Regular; Thin liquids (Level 0) Liquid Administration via: Straw; Cup Medication Administration: Whole meds with liquid (one at a time) Supervision: Staff to assist with self-feeding; Other (comment) (due to tremors) Swallowing strategies  : Slow rate; Small bites/sips Postural changes: Position pt fully upright for meals Oral care recommendations: Oral care BID (2x/day) Treatment Plan Treatment Plan Treatment recommendations: No treatment recommended at this time Follow-up recommendations: No SLP follow up Functional status  assessment: Patient has not had a recent decline in their functional status. Recommendations Recommendations for follow up therapy are one component of a multi-disciplinary discharge planning process, led by the attending physician.  Recommendations may be updated based on patient status, additional functional criteria and insurance authorization. Assessment: Orofacial Exam: Orofacial Exam Oral Cavity: Oral Hygiene: WFL Oral Cavity - Dentition: Missing dentition (missing several lower) Orofacial Anatomy: WFL Oral Motor/Sensory Function: WFL Anatomy: Anatomy: WFL Boluses Administered: Boluses Administered Boluses Administered: Thin liquids (Level 0); Mildly thick liquids (Level 2, nectar thick); Moderately thick liquids (Level 3, honey thick); Puree; Solid  Oral Impairment Domain: Oral Impairment Domain Lip Closure: No labial escape Tongue  control during bolus hold: Cohesive bolus between tongue to palatal seal Bolus preparation/mastication: Timely and efficient chewing and mashing Bolus transport/lingual motion: Brisk tongue motion Oral residue: Trace residue lining oral structures Location of oral residue : Tongue Initiation of pharyngeal swallow : Valleculae  Pharyngeal Impairment Domain: Pharyngeal Impairment Domain Soft palate elevation: No bolus between soft palate (SP)/pharyngeal wall (PW) Laryngeal elevation: Complete superior movement of thyroid cartilage with complete approximation of arytenoids to epiglottic petiole Anterior hyoid excursion: Complete anterior movement Epiglottic movement: Complete inversion Laryngeal vestibule closure: Complete, no air/contrast in laryngeal vestibule Pharyngeal stripping wave : Present - complete Pharyngeal contraction (A/P view only): N/A Pharyngoesophageal segment opening: Complete distension and complete duration, no obstruction of flow Tongue base retraction: No contrast between tongue base and posterior pharyngeal wall (PPW) Pharyngeal residue: Collection of residue within or on pharyngeal structures (with thin) Location of pharyngeal residue: Valleculae (with thin)  Esophageal Impairment Domain: Esophageal Impairment Domain Esophageal clearance upright position: Complete clearance, esophageal coating Pill: No data recorded Penetration/Aspiration Scale Score: Penetration/Aspiration Scale Score 1.  Material does not enter airway: Mildly thick liquids (Level 2, nectar thick); Moderately thick liquids (Level 3, honey thick); Puree; Solid; Pill 2.  Material enters airway, remains ABOVE vocal cords then ejected out: Thin liquids (Level 0) 3.  Material enters airway, remains ABOVE vocal cords and not ejected out: Thin liquids (Level 0) Compensatory Strategies: Compensatory Strategies Compensatory strategies: Yes Straw: Effective Effective Straw: Thin liquid (Level 0)   General Information: Caregiver present:  No  Diet Prior to this Study: Regular; Thin liquids (Level 0)   Temperature : Normal   Respiratory Status: WFL   Supplemental O2: None (Room air)   History of Recent Intubation: No  Behavior/Cognition: Alert; Cooperative; Pleasant mood Self-Feeding Abilities: Needs assist with self-feeding (due to tremors) Baseline vocal quality/speech: Normal Volitional Cough: Able to elicit Volitional Swallow: Able to elicit Exam Limitations: No limitations Goal Planning: Prognosis for improved oropharyngeal function: Good No data recorded No data recorded No data recorded Consulted and agree with results and recommendations: Patient; Physician; Nurse Pain: Pain Assessment Pain Assessment: Faces Faces Pain Scale: 0 End of Session: Start Time:SLP Start Time (ACUTE ONLY): 1236 Stop Time: SLP Stop Time (ACUTE ONLY): 1249 Time Calculation:SLP Time Calculation (min) (ACUTE ONLY): 13 min Charges: SLP Evaluations $ SLP Speech Visit: 1 Visit SLP Evaluations $BSS Swallow: 1 Procedure $MBS Swallow: 1 Procedure SLP visit diagnosis: SLP Visit Diagnosis: Dysphagia, pharyngeal phase (R13.13) Past Medical History: Past Medical History: Diagnosis Date  AAA (abdominal aortic aneurysm) (HCC)   asymptomatic  AAA (abdominal aortic aneurysm) (HCC) 2010  peripheral  angiogram-- bilateral SFA DISEASE  and 60 to 70% infrarenaal abd. aortic stenosis with 15 -mm gradient  Adenomatous colon polyp 11/2003  CAD (coronary artery disease)   Gait abnormality 02/13/2017  Gastroparesis   pt  denies  GERD (gastroesophageal reflux disease)   w/ LPR  History of kidney stones   x2  Hyperlipidemia   Hypertension   IDA (iron deficiency anemia)   Peripheral arterial disease (HCC)   RCEA  by Dr [name]  PUD (peptic ulcer disease)   pt unaware  Vertigo  Past Surgical History: Past Surgical History: Procedure Laterality Date  ABDOMINAL AORTOGRAM W/LOWER EXTREMITY Bilateral 08/05/2018  Procedure: ABDOMINAL AORTOGRAM W/LOWER EXTREMITY;  Surgeon: Runell Gess, MD;   Location: [facility];  Service: Cardiovascular;  Laterality: Bilateral;  ABDOMINAL AORTOGRAM W/LOWER EXTREMITY N/A 09/26/2021  Procedure: ABDOMINAL AORTOGRAM W/LOWER EXTREMITY;  Surgeon: Runell Gess, MD;  Location: [facility];  Service: Cardiovascular;  Laterality: N/A;  AORTOGRAM  04/24/2016   Abdominal aortogram, bilateral iliac angiogram, bifemoral runoff  CARDIAC CATHETERIZATION  05/12/2005  RCA  carotid doppler  01/24/2013  RICA endarterectomy,left CCA 0-49%; left bulb and prox ICA 50-69%; bilaateral subclavian < 50%  CAROTID ENDARTERECTOMY  11/01/2010  CATARACT EXTRACTION    COLONOSCOPY    CORONARY ANGIOPLASTY  05/18/2005  2 STENTS distal RCA AND PROXIMAL-MID RCA   5 total stents per pt.  CYSTOSCOPY/URETEROSCOPY/HOLMIUM LASER/STENT PLACEMENT Left 01/11/2018  Procedure: LEFT URETEROSCOPY/HOLMIUM LASER/STENT PLACEMENT;  Surgeon: Crist Fat, MD;  Location: [facility];  Service: Urology;  Laterality: Left;  DOPPLER ECHOCARDIOGRAPHY  02/07/2012  EF 55%,SHOWED NO ISCHEMIA   HERNIA REPAIR    umbilical  lower arterial  doppler  02/04/2013  aotra 1.5 x 1.5 cm; distal abd aorta 70-99%,proximal common iliac arteries -very stenotic with increased velocities>50%,may be falsely elevated as a result of residual plaque from the distal aorta stenosis  lower extremity doppler  June 18 ,2013  ABI'S ABNORMAL, RABI was 0.88 and LABI 0.75 ,with 3-vessel  run off  NM MYOVIEW LTD  MAY 23,2011  showed no significant ischemia;  NM MYOVIEW LTD  04/22/2008  ef 77%,exercise capcity 6 METS ,exaggerated blood pressure response to exercise  PERIPHERAL VASCULAR CATHETERIZATION N/A 04/24/2016  Procedure: Lower Extremity Angiography;  Surgeon: Runell Gess, MD;  Location: [facility]ASIVE CV [facility];  Service: Cardiovascular;  Laterality: N/A;  PERIPHERAL VASCULAR CATHETERIZATION  04/24/2016  Procedure: Peripheral Vascular Intervention;  Surgeon: Runell Gess, MD;  Location: [facility] INVASIVE CV LAB;  Service: Cardiovascular;;  Aorta   retrograde central aortic catheterization  05/19/2005  cutting balloon atherectomy, c-circ stenosis with DES STENTING CYPHER  TONSILLECTOMY    TOTAL HIP ARTHROPLASTY Right 07/11/2022  Procedure: TOTAL HIP ARTHROPLASTY ANTERIOR APPROACH;  Surgeon: Durene Romans, MD;  Location: [facility];  Service: Orthopedics;  Laterality: Right;  TOTAL KNEE ARTHROPLASTY Left 01/03/2019  Procedure: TOTAL KNEE ARTHROPLASTY;  Surgeon: Beverely Low, MD;  Location: [facility];  Service: Orthopedics;  Laterality: Left;  with IS block  TRANSURETHRAL RESECTION OF PROSTATE N/A 09/08/2016  Procedure: TRANSURETHRAL RESECTION OF THE PROSTATE (TURP);  Surgeon: Crist Fat, MD;  Location: [facility];  Service: Urology;  Laterality: N/A;  TRANSURETHRAL RESECTION OF PROSTATE    10-10-17  Dr. Marlou Porch  TRANSURETHRAL RESECTION OF PROSTATE N/A 10/10/2017  Procedure: TRANSURETHRAL RESECTION OF THE PROSTATE (TURP);  Surgeon: Crist Fat, MD;  Location: [facility];  Service: Urology;  Laterality: N/A;  VASECTOMY   Royce Macadamia 09/28/2023, 2:01 PM  DG Chest 2 View Result Date: 09/25/2023 CLINICAL DATA:  Short of breath, lower extremity swelling EXAM: CHEST - 2 VIEW COMPARISON:  03/01/2023 FINDINGS: Frontal and lateral views of the chest demonstrate a stable cardiac silhouette. There is chronic  central pulmonary vascular congestion. There are small bilateral pleural effusions. Minimal consolidation at the left lung base most consistent with atelectasis. No pneumothorax. No acute bony abnormalities. IMPRESSION: 1. Stable enlarged cardiac silhouette and chronic central vascular congestion. 2. Small bilateral pleural effusions, with left basilar atelectasis. Electronically Signed   By: Sharlet Salina M.D.   On: 09/25/2023 22:21   ECHOCARDIOGRAM COMPLETE Result Date: 09/14/2023    ECHOCARDIOGRAM REPORT   Patient Name:   Caryl Manas [facility] Date of Exam: 09/14/2023 Medical Rec #:  [id number]         Height:       64.0 in Accession #:    [id number]         Weight:       182.8 lb Date of Birth:  1930/12/09         BSA:          1.883 m Patient Age:    89 years          BP:           128/64 mmHg Patient Gender: M                 HR:           57 bpm. Exam Location:  [address] Procedure: 2D Echo, 3D Echo and Strain Analysis (Both Spectral and Color Flow            Doppler were utilized during procedure). Indications:    R60.0 Lower extremity edema  History:        Patient has prior history of Echocardiogram examinations, most                 recent 02/07/2012. CAD, PAD; Risk Factors:Hypertension and                 Dyslipidemia. Carotid artery disease.  Sonographer:    Cathie Beams RCS Referring Phys: HAO MENG IMPRESSIONS  1. Left ventricular ejection fraction, by estimation, is 60 to 65%. Left ventricular ejection fraction by 3D volume is 63 %. The left ventricle has normal function. The left ventricle has no regional wall motion abnormalities. There is mild concentric left ventricular hypertrophy. Left ventricular diastolic parameters are consistent with Grade I diastolic dysfunction (impaired relaxation). The average left ventricular global longitudinal strain is -24.2 %. The global longitudinal strain is normal.  2. Right ventricular systolic function is normal. The right ventricular size is normal.  3. Left atrial size was mildly dilated.  4. The mitral valve is normal in structure. Trivial mitral valve regurgitation. No evidence of mitral stenosis.  5. The aortic valve is tricuspid. There is mild calcification of the aortic valve. Aortic valve regurgitation is not visualized. Aortic valve sclerosis/calcification is present, without any evidence of aortic stenosis.  6. The inferior vena cava is normal in size with <50% respiratory variability, suggesting right atrial pressure of 8 mmHg. FINDINGS  Left Ventricle: Left ventricular ejection fraction, by estimation, is 60 to 65%. Left ventricular ejection fraction by 3D volume is 63 %. The left ventricle has normal  function. The left ventricle has no regional wall motion abnormalities. The average left ventricular global longitudinal strain is -24.2 %. Strain was performed and the global longitudinal strain is normal. The left ventricular internal cavity size was normal in size. There is mild concentric left ventricular hypertrophy. Left ventricular diastolic parameters are consistent with Grade I diastolic dysfunction (impaired relaxation). Right Ventricle: The right ventricular size is normal. No increase in right  ventricular wall thickness. Right ventricular systolic function is normal. Left Atrium: Left atrial size was mildly dilated. Right Atrium: Right atrial size was normal in size. Pericardium: There is no evidence of pericardial effusion. Mitral Valve: The mitral valve is normal in structure. Mild mitral annular calcification. Trivial mitral valve regurgitation. No evidence of mitral valve stenosis. Tricuspid Valve: The tricuspid valve is normal in structure. Tricuspid valve regurgitation is trivial. No evidence of tricuspid stenosis. Aortic Valve: The aortic valve is tricuspid. There is mild calcification of the aortic valve. Aortic valve regurgitation is not visualized. Aortic valve sclerosis/calcification is present, without any evidence of aortic stenosis. Pulmonic Valve: The pulmonic valve was normal in structure. Pulmonic valve regurgitation is mild. No evidence of pulmonic stenosis. Aorta: The aortic root is normal in size and structure. Venous: The inferior vena cava is normal in size with less than 50% respiratory variability, suggesting right atrial pressure of 8 mmHg. IAS/Shunts: No atrial level shunt detected by color flow Doppler. Additional Comments: 3D was performed not requiring image post processing on an independent workstation and was normal.  LEFT VENTRICLE PLAX 2D LVIDd:         4.70 cm         Diastology LVIDs:         2.40 cm         LV e' medial:    5.18 cm/s LV PW:         1.20 cm         LV  E/e' medial:  17.1 LV IVS:        1.10 cm         LV e' lateral:   6.23 cm/s LVOT diam:     2.00 cm         LV E/e' lateral: 14.2 LV SV:         78 LV SV Index:   42              2D LVOT Area:     3.14 cm        Longitudinal                                Strain                                2D Strain GLS  -24.2 %                                Avg:                                 3D Volume EF                                LV 3D EF:    Left                                             ventricul  ar                                             ejection                                             fraction                                             by 3D                                             volume is                                             63 %.                                 3D Volume EF:                                3D EF:        63 %                                LV EDV:       82 ml                                LV ESV:       30 ml                                LV SV:        51 ml RIGHT VENTRICLE RV Basal diam:  3.30 cm RV S prime:     10.80 cm/s TAPSE (M-mode): 1.6 cm LEFT ATRIUM             Index        RIGHT ATRIUM           Index LA diam:        5.00 cm 2.66 cm/m   RA Area:     22.50 cm LA Vol (A2C):   54.7 ml 29.05 ml/m  RA Volume:   65.90 ml  35.00 ml/m LA Vol (A4C):   53.4 ml 28.36 ml/m LA Biplane Vol: 58.9 ml 31.28 ml/m  AORTIC VALVE             PULMONIC VALVE LVOT Vmax:   82.20 cm/s  PR End Diast Vel: 7.18 msec LVOT Vmean:  50.900 cm/s LVOT VTI:    0.249 m  AORTA Ao Root diam: 3.30 cm Ao Asc diam:  3.20 cm MITRAL  VALVE MV Area (PHT): 2.62 cm    SHUNTS MV Decel Time: 290 msec    Systemic VTI:  0.25 m MV E velocity: 88.60 cm/s  Systemic Diam: 2.00 cm MV A velocity: 96.40 cm/s MV E/A ratio:  0.92 Arvilla Meres MD Electronically signed by Arvilla Meres MD Signature Date/Time: 09/14/2023/12:04:16 PM    Final     Microbiology: Results  for orders placed or performed during the hospital encounter of 09/25/23  Resp panel by RT-PCR (RSV, Flu A&B, Covid) Anterior Nasal Swab     Status: None   Collection Time: 09/27/23  2:21 PM   Specimen: Anterior Nasal Swab  Result Value Ref Range Status   SARS Coronavirus 2 by RT PCR NEGATIVE NEGATIVE Final   Influenza A by PCR NEGATIVE NEGATIVE Final   Influenza B by PCR NEGATIVE NEGATIVE Final    Comment: (NOTE) The Xpert Xpress SARS-CoV-2/FLU/RSV plus assay is intended as an aid in the diagnosis of influenza from Nasopharyngeal swab specimens and should not be used as a sole basis for treatment. Nasal washings and aspirates are unacceptable for Xpert Xpress SARS-CoV-2/FLU/RSV testing.  Fact Sheet for Patients: BloggerCourse.com  Fact Sheet for Healthcare Providers: SeriousBroker.it  This test is not yet approved or cleared by the [place] FDA and has been authorized for detection and/or diagnosis of SARS-CoV-2 by FDA under an Emergency Use Authorization (EUA). This EUA will remain in effect (meaning this test can be used) for the duration of the COVID-19 declaration under Section 564(b)(1) of the Act, 21 U.S.C. section 360bbb-3(b)(1), unless the authorization is terminated or revoked.     Resp Syncytial Virus by PCR NEGATIVE NEGATIVE Final    Comment: (NOTE) Fact Sheet for Patients: BloggerCourse.com  Fact Sheet for Healthcare Providers: SeriousBroker.it  This test is not yet approved or cleared by the [place] FDA and has been authorized for detection and/or diagnosis of SARS-CoV-2 by FDA under an Emergency Use Authorization (EUA). This EUA will remain in effect (meaning this test can be used) for the duration of the COVID-19 declaration under Section 564(b)(1) of the Act, 21 U.S.C. section 360bbb-3(b)(1), unless the authorization is terminated  or revoked.  Performed at [facility], [address], [place], [place] [address]   Culture, blood (Routine X 2) w Reflex to ID Panel     Status: None (Preliminary result)   Collection Time: 09/27/23  3:20 PM   Specimen: BLOOD LEFT ARM  Result Value Ref Range Status   Specimen Description BLOOD LEFT ARM  Final   Special Requests   Final    BOTTLES DRAWN AEROBIC AND ANAEROBIC Blood Culture adequate volume   Culture   Final    NO GROWTH 3 DAYS Performed at [facility], [address] [place], [place] [address]    Report Status PENDING  Incomplete  Culture, blood (Routine X 2) w Reflex to ID Panel     Status: None (Preliminary result)   Collection Time: 09/27/23  3:20 PM   Specimen: BLOOD RIGHT ARM  Result Value Ref Range Status   Specimen Description BLOOD RIGHT ARM  Final   Special Requests   Final    BOTTLES DRAWN AEROBIC AND ANAEROBIC Blood Culture results may not be optimal due to an inadequate volume of blood received in culture bottles   Culture   Final    NO GROWTH 3 DAYS Performed at [facility], [address], [place], [place] [address]    Report Status PENDING  Incomplete  Urine Culture (for pregnant, neutropenic or urologic patients or patients with an indwelling urinary catheter)     Status: Abnormal (Preliminary result)   Collection Time: 09/27/23  5:23 PM   Specimen: In/Out Cath Urine  Result Value Ref Range Status   Specimen Description IN/OUT CATH URINE  Final   Special Requests NONE  Final   Culture (A)  Final    >=100,000 COLONIES/mL PSEUDOMONAS AERUGINOSA SUSCEPTIBILITIES TO FOLLOW Performed at [facility], [address], [place], [place] [address]    Report Status PENDING  Incomplete    Labs: CBC: Recent Labs  Lab 09/25/23 2212 09/27/23 0228 09/29/23 0752 09/30/23 0231  WBC 7.4 9.3 9.4 7.1  NEUTROABS 5.6  --   --   --   HGB 11.7* 10.3* 11.1* 10.0*  HCT 36.7* 31.0* 33.3* 30.9*  MCV 91.5 88.3 88.6 90.4  PLT 181  187 185 184   Basic Metabolic Panel: Recent Labs  Lab 09/27/23 0228 09/28/23 0236 09/29/23 0752 09/30/23 0231 10/01/23 0234  NA 140 142 144 142 140  K 4.6 3.7 3.7 3.4* 3.6  CL 107 106 108 107 106  CO2 23 26 25 24 24   GLUCOSE 89 120* 127* 111* 102*  BUN 46* 49* 40* 37* 32*  CREATININE 1.79* 1.99* 1.39* 1.37* 1.16  CALCIUM 9.0 8.7* 9.2 8.8* 8.8*  MG  --  2.1  --   --  2.2   Liver Function Tests: No results for input(s): "AST", "ALT", "ALKPHOS", "BILITOT", "PROT", "ALBUMIN" in the last 168 hours. CBG: No results for input(s): "GLUCAP" in the last 168 hours.  Discharge time spent: greater than 30 minutes.  Signed: Tyrone Nine, MD [facility] 10/01/2023

## 2023-10-01 NOTE — Progress Notes (Signed)
 Rounding Note    Patient Name: Nathan Santiago [facility] Date of Encounter: 10/01/2023  [facility] Cardiologist: Nanetta Batty, MD   Subjective   Feeling much better. Alert and oriented. No CV complaints.  Inpatient Medications    Scheduled Meds:  amLODipine  10 mg Oral Daily   apixaban  5 mg Oral BID   atorvastatin  40 mg Oral q1800   carvedilol  12.5 mg Oral BID WC   cholecalciferol  1,000 Units Oral q AM   cloNIDine  0.1 mg Oral TID   erythromycin   Both Eyes Q8H   finasteride  5 mg Oral q AM   gabapentin  100 mg Oral TID   hydrALAZINE  50 mg Oral Q8H   iron polysaccharides  150 mg Oral Q M,W,F   isosorbide dinitrate  10 mg Oral TID   ketorolac  1 drop Both Eyes QID   loratadine  10 mg Oral QHS   melatonin  5 mg Oral QHS   multivitamin with minerals  1 tablet Oral q AM   pantoprazole  40 mg Oral BID   tamsulosin  0.4 mg Oral QPC supper   Continuous Infusions:  ceFEPime (MAXIPIME) IV 2 g (09/30/23 1430)   PRN Meds: acetaminophen **OR** acetaminophen, diphenhydrAMINE, hydrALAZINE, metoprolol tartrate, ondansetron **OR** ondansetron (ZOFRAN) IV, senna-docusate   Vital Signs    Vitals:   09/30/23 2325 10/01/23 0422 10/01/23 0423 10/01/23 0756  BP: (!) 137/47  (!) 122/41 (!) 149/43  Pulse: 68   80  Resp: 17 19    Temp: 98.3 F (36.8 C)  98.6 F (37 C) 97.9 F (36.6 C)  TempSrc: Oral  Oral Oral  SpO2: 93%  94% 97%  Weight:   77.8 kg   Height:        Intake/Output Summary (Last 24 hours) at 10/01/2023 0908 Last data filed at 09/30/2023 2141 Gross per 24 hour  Intake 360 ml  Output 1725 ml  Net -1365 ml      10/01/2023    4:23 AM 09/30/2023    4:09 AM 09/29/2023    5:15 AM  Last 3 Weights  Weight (lbs) 171 lb 9.6 oz 173 lb 4.5 oz 176 lb 2.4 oz  Weight (kg) 77.837 kg 78.6 kg 79.9 kg      Telemetry    NSR - Personally Reviewed  ECG    No new tracing - Personally Reviewed  Physical Exam   GEN: No acute distress.   Neck: No JVD Cardiac: RRR,  no murmurs, rubs, or gallops.  Respiratory: Clear to auscultation bilaterally. GI: Soft, nontender, non-distended  MS: No edema; No deformity. Neuro:  Nonfocal  Psych: Normal affect   Labs    High Sensitivity Troponin:   Recent Labs  Lab 09/25/23 2212 09/28/23 0826  TROPONINIHS 6 20*     Chemistry Recent Labs  Lab 09/28/23 0236 09/29/23 0752 09/30/23 0231 10/01/23 0234  NA 142 144 142 140  K 3.7 3.7 3.4* 3.6  CL 106 108 107 106  CO2 26 25 24 24   GLUCOSE 120* 127* 111* 102*  BUN 49* 40* 37* 32*  CREATININE 1.99* 1.39* 1.37* 1.16  CALCIUM 8.7* 9.2 8.8* 8.8*  MG 2.1  --   --  2.2  GFRNONAA 31* 48* 49* 60*  ANIONGAP 10 11 11 10     Lipids No results for input(s): "CHOL", "TRIG", "HDL", "LABVLDL", "LDLCALC", "CHOLHDL" in the last 168 hours.  Hematology Recent Labs  Lab 09/27/23 [phone] 09/29/23 5409 09/30/23  0231  WBC 9.3 9.4 7.1  RBC 3.51* 3.76* 3.42*  HGB 10.3* 11.1* 10.0*  HCT 31.0* 33.3* 30.9*  MCV 88.3 88.6 90.4  MCH 29.3 29.5 29.2  MCHC 33.2 33.3 32.4  RDW 12.9 12.6 12.5  PLT 187 185 184   Thyroid No results for input(s): "TSH", "FREET4" in the last 168 hours.  BNP Recent Labs  Lab 09/25/23 2212  BNP 488.2*    DDimer No results for input(s): "DDIMER" in the last 168 hours.    Patient Profile     89 y.o. male with CAD and remote coronary stent 2006, AAA, HTN, carotid endarterectomy, L renal artery stenosis, PAD, presented with acute diastolic HF and new onset AFib w RVR  in the setting of UTI and delirium. Has returned spontaneously to NSR, initial AKI rapidly improving, delirium resolved.  Assessment & Plan     [facility] will sign off.   Medication Recommendations:  Eliquis 5 mg twice daily. Clopidogrel stopped. Resume other cardiac meds at DC. Other recommendations (labs, testing, etc):  per  primary team Follow up as an outpatient:  Has April appt with Dr. Allyson Sabal.  For questions or updates, please contact [facility] Please consult [email or web address] for contact info under        Signed, Thurmon Fair, MD  10/01/2023, 9:08 AM

## 2023-10-01 NOTE — Plan of Care (Signed)
  Problem: Education: Goal: Knowledge of General Education information will improve Description: Including pain rating scale, medication(s)/side effects and non-pharmacologic comfort measures Outcome: Progressing   Problem: Health Behavior/Discharge Planning: Goal: Ability to manage health-related needs will improve Outcome: Progressing   Problem: Clinical Measurements: Goal: Ability to maintain clinical measurements within normal limits will improve Outcome: Progressing Goal: Will remain free from infection Outcome: Progressing Goal: Diagnostic test results will improve Outcome: Progressing Goal: Respiratory complications will improve Outcome: Progressing Goal: Cardiovascular complication will be avoided Outcome: Progressing   Problem: Activity: Goal: Risk for activity intolerance will decrease Outcome: Progressing   Problem: Nutrition: Goal: Adequate nutrition will be maintained Outcome: Progressing   Problem: Coping: Goal: Level of anxiety will decrease Outcome: Progressing   Problem: Elimination: Goal: Will not experience complications related to bowel motility Outcome: Progressing Goal: Will not experience complications related to urinary retention Outcome: Progressing   Problem: Pain Managment: Goal: General experience of comfort will improve and/or be controlled Outcome: Progressing   Problem: Safety: Goal: Ability to remain free from injury will improve Outcome: Progressing   Problem: Skin Integrity: Goal: Risk for impaired skin integrity will decrease Outcome: Progressing   Problem: Education: Goal: Knowledge of disease and its progression will improve Outcome: Progressing   Problem: Health Behavior/Discharge Planning: Goal: Ability to manage health-related needs will improve Outcome: Progressing   Problem: Clinical Measurements: Goal: Complications related to the disease process or treatment will be avoided or minimized Outcome:  Progressing Goal: Dialysis access will remain free of complications Outcome: Progressing   Problem: Activity: Goal: Activity intolerance will improve Outcome: Progressing   Problem: Fluid Volume: Goal: Fluid volume balance will be maintained or improved Outcome: Progressing   Problem: Nutritional: Goal: Ability to make appropriate dietary choices will improve Outcome: Progressing   Problem: Respiratory: Goal: Respiratory symptoms related to disease process will be avoided Outcome: Progressing   Problem: Self-Concept: Goal: Body image disturbance will be avoided or minimized Outcome: Progressing   Problem: Urinary Elimination: Goal: Progression of disease will be identified and treated Outcome: Progressing   Problem: Education: Goal: Ability to demonstrate management of disease process will improve Outcome: Progressing Goal: Ability to verbalize understanding of medication therapies will improve Outcome: Progressing Goal: Individualized Educational Video(s) Outcome: Progressing   Problem: Activity: Goal: Capacity to carry out activities will improve Outcome: Progressing   Problem: Cardiac: Goal: Ability to achieve and maintain adequate cardiopulmonary perfusion will improve Outcome: Progressing

## 2023-10-01 NOTE — Discharge Instructions (Signed)

## 2023-10-01 NOTE — Telephone Encounter (Signed)
 Patient Product/process development scientist completed.    The patient is insured through [facility]. Patient has Medicare and is not eligible for a copay card, but may be able to apply for patient assistance or Medicare RX Payment Plan (Patient Must reach out to their plan, if eligible for payment plan), if available.    Ran test claim for Eliquis 5 mg and the current 30 day co-pay is $47.00.  Ran test claim for Xarelto 20 mg and the current 30 day co-pay is $47.00.  This test claim was processed through [facility]- copay amounts may vary at other pharmacies due to pharmacy/plan contracts, or as the patient moves through the different stages of their insurance plan.     Roland Earl, CPHT Pharmacy Technician III Certified Patient Advocate [facility] Patient Advocate Team Direct Number: [phone]  Fax: [phone]

## 2023-10-01 NOTE — Plan of Care (Signed)

## 2023-10-02 ENCOUNTER — Encounter: Payer: Self-pay | Admitting: Orthopedic Surgery

## 2023-10-02 ENCOUNTER — Non-Acute Institutional Stay (SKILLED_NURSING_FACILITY): Admitting: Orthopedic Surgery

## 2023-10-02 DIAGNOSIS — I10 Essential (primary) hypertension: Secondary | ICD-10-CM | POA: Diagnosis not present

## 2023-10-02 DIAGNOSIS — I701 Atherosclerosis of renal artery: Secondary | ICD-10-CM

## 2023-10-02 DIAGNOSIS — N179 Acute kidney failure, unspecified: Secondary | ICD-10-CM | POA: Diagnosis not present

## 2023-10-02 DIAGNOSIS — I480 Paroxysmal atrial fibrillation: Secondary | ICD-10-CM

## 2023-10-02 DIAGNOSIS — I5033 Acute on chronic diastolic (congestive) heart failure: Secondary | ICD-10-CM | POA: Diagnosis not present

## 2023-10-02 DIAGNOSIS — Z66 Do not resuscitate: Secondary | ICD-10-CM

## 2023-10-02 LAB — CULTURE, BLOOD (ROUTINE X 2)
Culture: NO GROWTH
Culture: NO GROWTH
Special Requests: ADEQUATE

## 2023-10-02 LAB — URINE CULTURE: Culture: >=100000 — AB

## 2023-10-03 ENCOUNTER — Encounter: Payer: Self-pay | Admitting: Orthopedic Surgery

## 2023-10-03 NOTE — Progress Notes (Signed)
 Location:  [facility] Room Number: 149A Place of Service:  SNF [phone]) Provider:  Octavia Heir, NP   Mahlon Gammon, MD  Patient Care Team: Mahlon Gammon, MD as PCP - General (Internal Medicine) Runell Gess, MD as PCP - Cardiology (Cardiology)  Extended Emergency Contact Information Primary Emergency Contact: [name] Address: [address]          [place], [place] [address] [place] of [place] Home Phone: [phone] Mobile Phone: [phone] Relation: Spouse  Code Status:  DNR Goals of care: Advanced Directive information    10/02/2023   11:27 AM  Advanced Directives  Does Patient Have a Medical Advance Directive? Yes  Type of Estate agent of [place];Living will  Does patient want to make changes to medical advance directive? No - Patient declined  Copy of Healthcare Power of Attorney in Chart? Yes - validated most recent copy scanned in chart (See row information)     Chief Complaint  Patient presents with   Hospitalization Follow-up    HPI:  Pt is a 89 y.o. male seen today for f/u s/p hospitalization at [facility] 02/25-03/03 due to AKI.   He currently resides on the skilled nursing unit at [facility]. PMH: CAD with stents, PAD, renal artery stenosis, HTN, AAA, HLD, GERD, fall with right hip fracture s/p RATH 06/2022, and unstable gait.   02/25 nursing noted he was more lethargic and he was sent to ED for evaluation. He was admitted for  suspected CHF and AKI. BUN/creat 48/1.83 on admission. BNP 488.2. Temp was 101. Blood cultures showed no growth. Urine culture revealed > 100,000 pseudomonas aeruginosa, he was given 3 days cefepime. WBC was normal throughout hospital stay. Lethargy/delirium was believed to be associated with UTI related encephalopathy. Delirium improved after starting antibiotics. EKG revealed paroxysmal atrial fibrillation with RVR. Thought to be exacerbated due to UTI.  Converted to NSR with amiodarone. Advised to continue metoprolol. He was started on Eliquis. Acute on chronic CHF associated with volume overload. Diuresis was held and he became euvolemic. H/o renal artery stenosis. Vascular surgery did not recommend femoral endarterectomy. Plavix was discontinued since Eliquis was started. He was discharged back to SNF, advised to follow up with cardiology 11/21/2023.   Today, he denies chest pain, shortness of breath, acute cough or edema. Mentation appears at baseline. He is eating 2 meals daily. Blood pressures averaging SBP 150's. No recent falls. Ambulating with PWC. Afebrile. Vitals stable.     Past Medical History:  Diagnosis Date   AAA (abdominal aortic aneurysm) (HCC)    asymptomatic   AAA (abdominal aortic aneurysm) (HCC) 2010   peripheral  angiogram-- bilateral SFA DISEASE  and 60 to 70% infrarenaal abd. aortic stenosis with 15 -mm gradient   Adenomatous colon polyp 11/2003   CAD (coronary artery disease)    Gait abnormality 02/13/2017   Gastroparesis    pt denies   GERD (gastroesophageal reflux disease)    w/ LPR   History of kidney stones    x2   Hyperlipidemia    Hypertension    IDA (iron deficiency anemia)    Peripheral arterial disease (HCC)    RCEA  by Dr [name]   PUD (peptic ulcer disease)    pt unaware   Vertigo    Past Surgical History:  Procedure Laterality Date   ABDOMINAL AORTOGRAM W/LOWER EXTREMITY Bilateral 08/05/2018   Procedure: ABDOMINAL AORTOGRAM W/LOWER EXTREMITY;  Surgeon: Runell Gess, MD;  Location:  [facility]AS[facility] CV [facility];  Service: Cardiovascular;  Laterality: Bilateral;   ABDOMINAL AORTOGRAM W/LOWER EXTREMITY N/A 09/26/2021   Procedure: ABDOMINAL AORTOGRAM W/LOWER EXTREMITY;  Surgeon: Runell Gess, MD;  Location: [facility];  Service: Cardiovascular;  Laterality: N/A;   AORTOGRAM  04/24/2016    Abdominal aortogram, bilateral iliac angiogram, bifemoral runoff   CARDIAC CATHETERIZATION   05/12/2005   RCA   carotid doppler  01/24/2013   RICA endarterectomy,left CCA 0-49%; left bulb and prox ICA 50-69%; bilaateral subclavian < 50%   CAROTID ENDARTERECTOMY  11/01/2010   CATARACT EXTRACTION     COLONOSCOPY     CORONARY ANGIOPLASTY  05/18/2005   2 STENTS distal RCA AND PROXIMAL-MID RCA   5 total stents per pt.   CYSTOSCOPY/URETEROSCOPY/HOLMIUM LASER/STENT PLACEMENT Left 01/11/2018   Procedure: LEFT URETEROSCOPY/HOLMIUM LASER/STENT PLACEMENT;  Surgeon: Crist Fat, MD;  Location: [facility];  Service: Urology;  Laterality: Left;   DOPPLER ECHOCARDIOGRAPHY  02/07/2012   EF 55%,SHOWED NO ISCHEMIA    HERNIA REPAIR     umbilical   lower arterial  doppler  02/04/2013   aotra 1.5 x 1.5 cm; distal abd aorta 70-99%,proximal common iliac arteries -very stenotic with increased velocities>50%,may be falsely elevated as a result of residual plaque from the distal aorta stenosis   lower extremity doppler  June 18 ,2013   ABI'S ABNORMAL, RABI was 0.88 and LABI 0.75 ,with 3-vessel  run off   NM MYOVIEW LTD  MAY 23,2011   showed no significant ischemia;   NM MYOVIEW LTD  04/22/2008   ef 77%,exercise capcity 6 METS ,exaggerated blood pressure response to exercise   PERIPHERAL VASCULAR CATHETERIZATION N/A 04/24/2016   Procedure: Lower Extremity Angiography;  Surgeon: Runell Gess, MD;  Location: [facility] INVASIVE CV LAB;  Service: Cardiovascular;  Laterality: N/A;   PERIPHERAL VASCULAR CATHETERIZATION  04/24/2016   Procedure: Peripheral Vascular Intervention;  Surgeon: Runell Gess, MD;  Location: [facility] INVASIVE CV LAB;  Service: Cardiovascular;;  Aorta   retrograde central aortic catheterization  05/19/2005   cutting balloon atherectomy, c-circ stenosis with DES STENTING CYPHER   TONSILLECTOMY     TOTAL HIP ARTHROPLASTY Right 07/11/2022   Procedure: TOTAL HIP ARTHROPLASTY ANTERIOR APPROACH;  Surgeon: Durene Romans, MD;  Location: [facility];  Service: Orthopedics;  Laterality: Right;   TOTAL KNEE  ARTHROPLASTY Left 01/03/2019   Procedure: TOTAL KNEE ARTHROPLASTY;  Surgeon: Beverely Low, MD;  Location: [facility];  Service: Orthopedics;  Laterality: Left;  with IS block   TRANSURETHRAL RESECTION OF PROSTATE N/A 09/08/2016   Procedure: TRANSURETHRAL RESECTION OF THE PROSTATE (TURP);  Surgeon: Crist Fat, MD;  Location: [facility];  Service: Urology;  Laterality: N/A;   TRANSURETHRAL RESECTION OF PROSTATE     10-10-17  Dr. Marlou Porch   TRANSURETHRAL RESECTION OF PROSTATE N/A 10/10/2017   Procedure: TRANSURETHRAL RESECTION OF THE PROSTATE (TURP);  Surgeon: Crist Fat, MD;  Location: [facility];  Service: Urology;  Laterality: N/A;   VASECTOMY      Allergies  Allergen Reactions   Lisinopril Cough   Niaspan [Niacin Er (Antihyperlipidemic)] Rash    Outpatient Encounter Medications as of 10/02/2023  Medication Sig   acetaminophen (TYLENOL) 500 MG tablet Take 1 tablet (500 mg total) by mouth 2 (two) times daily as needed.   amLODipine (NORVASC) 10 MG tablet Take 10 mg by mouth daily.   apixaban (ELIQUIS) 5 MG TABS tablet Take 1 tablet (5 mg total) by mouth 2 (two) times daily.   Apoaequorin (  PREVAGEN PO) Take 1 tablet by mouth in the morning.   atorvastatin (LIPITOR) 40 MG tablet Take 40 mg by mouth daily at 6 PM.    Biotin 10 MG TABS Take 10 mg by mouth in the morning.   cetirizine (ZYRTEC) 10 MG tablet Take 1 tablet (10 mg total) by mouth at bedtime.   chlorthalidone (HYGROTON) 50 MG tablet Take 50 mg by mouth in the morning. Hold if BP < 95   cholecalciferol (VITAMIN D3) 25 MCG (1000 UT) tablet Take 1,000 Units by mouth in the morning.   cloNIDine (CATAPRES) 0.1 MG tablet Take 1 tablet (0.1 mg total) by mouth 3 (three) times daily. (Patient taking differently: Take 0.1 mg by mouth 3 (three) times daily. Hold if BP < 95)   finasteride (PROSCAR) 5 MG tablet Take 5 mg by mouth in the morning.   gabapentin (NEURONTIN) 100 MG capsule Take 100 mg by mouth 3 (three) times daily.    Glucosamine-Chondroitin 750-600 MG TABS Take 1 tablet by mouth 2 (two) times daily.   hydrALAZINE (APRESOLINE) 25 MG tablet Take 25 mg by mouth in the morning and at bedtime. Hold if BP < 95   hydrALAZINE (APRESOLINE) 50 MG tablet Take 50 mg by mouth every evening. Hold if BP <95   iron polysaccharides (NIFEREX) 150 MG capsule Take 150 mg by mouth every Monday, Wednesday, and Friday.   ketorolac (ACULAR) 0.5 % ophthalmic solution Place 1 drop into both eyes 4 (four) times daily.   losartan (COZAAR) 50 MG tablet Take 50 mg by mouth 2 (two) times daily.   melatonin 5 MG TABS Take 1 tablet (5 mg total) by mouth at bedtime.   metoprolol tartrate (LOPRESSOR) 50 MG tablet Take 75 mg by mouth 2 (two) times daily.   Multiple Vitamin (MULTIVITAMIN WITH MINERALS) TABS tablet Take 1 tablet by mouth in the morning.   pantoprazole (PROTONIX) 40 MG tablet Take 40 mg by mouth 2 (two) times daily.   potassium chloride SA (K-DUR,KLOR-CON) 20 MEQ tablet Take 20 mEq by mouth every evening.    tamsulosin (FLOMAX) 0.4 MG CAPS capsule Take 1 capsule (0.4 mg total) by mouth daily after supper.   No facility-administered encounter medications on file as of 10/02/2023.    Review of Systems  Constitutional:  Negative for activity change and appetite change.  HENT:  Positive for hearing loss. Negative for sore throat and trouble swallowing.   Eyes:  Negative for visual disturbance.  Respiratory:  Negative for cough, shortness of breath and wheezing.   Cardiovascular:  Negative for chest pain and leg swelling.  Gastrointestinal:  Negative for abdominal pain and constipation.  Genitourinary:  Negative for dysuria and hematuria.  Musculoskeletal:  Positive for arthralgias and gait problem.  Skin:  Negative for wound.  Neurological:  Positive for weakness. Negative for dizziness and headaches.  Psychiatric/Behavioral:  Negative for confusion and dysphoric mood. The patient is not nervous/anxious.     Immunization  History  Administered Date(s) Administered   Fluad Quad(high Dose 65+) 05/02/2022, 05/22/2023   Influenza, High Dose Seasonal PF 03/31/2017, 03/29/2018   Influenza,inj,Quad PF,6+ Mos 04/25/2016, 05/06/2018   Influenza,inj,quad, With Preservative 05/26/2019   Influenza-Unspecified 05/14/2001, 05/31/2001, 05/14/2002, 05/15/2002, 05/18/2003, 05/25/2004, 04/30/2005, 05/29/2006, 05/01/2007, 05/11/2008, 05/31/2009, 05/31/2010, 04/30/2013, 05/31/2014, 03/31/2021   Moderna Covid-19 Vaccine Bivalent Booster 39yrs & up 08/31/2021, 05/22/2023   Moderna SARS-COV2 Booster Vaccination 06/15/2020, 11/19/2020, 01/02/2022   Moderna Sars-Covid-2 Vaccination 08/12/2019, 09/10/2019, 05/31/2022   Pfizer Covid-19 Vaccine Bivalent Booster 8yrs & up  11/23/2022   Pneumococcal Polysaccharide-23 05/02/2000, 09/24/2018   Rsv, Bivalent, Protein Subunit Rsvpref,pf Verdis Frederickson) 07/19/2022   Td 09/08/2013   Zoster Recombinant(Shingrix) 10/04/2017, 01/23/2018   Pertinent  Health Maintenance Due  Topic Date Due   INFLUENZA VACCINE  Completed      07/13/2022    7:10 AM 07/28/2022    9:00 AM 10/14/2022    7:21 AM 11/28/2022    2:50 PM 06/04/2023    4:59 PM  Fall Risk  Falls in the past year?   1 1 0  Was there an injury with Fall?   1 0 0  Fall Risk Category Calculator   3 1 0  (RETIRED) Patient Fall Risk Level High fall risk High fall risk     Patient at Risk for Falls Due to    History of fall(s);Impaired balance/gait;Impaired mobility No Fall Risks  Fall risk Follow up   Falls evaluation completed Falls evaluation completed;Education provided;Falls prevention discussed Falls evaluation completed   Functional Status Survey:    Vitals:   10/02/23 1125 10/02/23 1127  BP: (!) 152/66 (!) 154/66  Pulse: (!) 58   Resp: 16   Temp: 98.4 F (36.9 C)   SpO2: 92%   Weight: 181 lb 6.4 oz (82.3 kg)   Height: 5\' 4"  (1.626 m)    Body mass index is 31.14 kg/m. Physical Exam Vitals reviewed.  Constitutional:       General: He is not in acute distress. HENT:     Head: Normocephalic.     Right Ear: There is no impacted cerumen.     Left Ear: There is no impacted cerumen.     Nose: Nose normal.     Mouth/Throat:     Mouth: Mucous membranes are moist.  Eyes:     General:        Right eye: No discharge.        Left eye: No discharge.  Cardiovascular:     Rate and Rhythm: Normal rate. Rhythm irregular.     Pulses: Normal pulses.     Heart sounds: Normal heart sounds.  Pulmonary:     Effort: Pulmonary effort is normal.     Breath sounds: Normal breath sounds.  Abdominal:     General: Bowel sounds are normal.     Palpations: Abdomen is soft.  Musculoskeletal:     Cervical back: Neck supple.     Right lower leg: No edema.     Left lower leg: No edema.  Skin:    General: Skin is warm.     Capillary Refill: Capillary refill takes less than 2 seconds.  Neurological:     General: No focal deficit present.     Mental Status: He is alert and oriented to person, place, and time.     Motor: Weakness present.     Gait: Gait abnormal.     Comments: PWC  Psychiatric:        Mood and Affect: Mood normal.     Labs reviewed: Recent Labs    09/28/23 0236 09/29/23 0752 09/30/23 0231 10/01/23 0234  NA 142 144 142 140  K 3.7 3.7 3.4* 3.6  CL 106 108 107 106  CO2 26 25 24 24   GLUCOSE 120* 127* 111* 102*  BUN 49* 40* 37* 32*  CREATININE 1.99* 1.39* 1.37* 1.16  CALCIUM 8.7* 9.2 8.8* 8.8*  MG 2.1  --   --  2.2   Recent Labs    10/17/22 0000 06/18/23 0000  AST 16  16  ALT 15 15  ALKPHOS 111 91  ALBUMIN 4.1 3.9   Recent Labs    09/25/23 2212 09/27/23 0228 09/29/23 0752 09/30/23 0231  WBC 7.4 9.3 9.4 7.1  NEUTROABS 5.6  --   --   --   HGB 11.7* 10.3* 11.1* 10.0*  HCT 36.7* 31.0* 33.3* 30.9*  MCV 91.5 88.3 88.6 90.4  PLT 181 187 185 184   Lab Results  Component Value Date   TSH 6.02 (A) 06/18/2023   Lab Results  Component Value Date   HGBA1C 5.5 11/23/2016   Lab Results   Component Value Date   CHOL 133 10/17/2022   HDL 49 10/17/2022   LDLCALC 65 10/17/2022   TRIG 122 10/17/2022   CHOLHDL 2.4 11/23/2016    Significant Diagnostic Results in last 30 days:  DG CHEST PORT 1 VIEW Result Date: 09/28/2023 CLINICAL DATA:  Cough EXAM: PORTABLE CHEST 1 VIEW COMPARISON:  X-ray 09/25/2023 FINDINGS: No consolidation, pneumothorax or edema. Tiny left pleural effusion with blunting of the left costophrenic angle. Normal cardiopericardial silhouette. Calcified aorta. Overlapping cardiac leads. Degenerative changes of the spine. IMPRESSION: Tiny left effusion versus pleural thickening. No consolidation or edema Electronically Signed   By: Karen Kays M.D.   On: 09/28/2023 16:40   DG Swallowing Func-Speech Pathology Result Date: 09/28/2023 Table formatting from the original result was not included. Modified Barium Swallow Study Patient Details Name: Nathan Santiago MRN: [id number] Date of Birth: 07/31/1931 Today's Date: 09/28/2023 HPI/PMH: HPI: Pt is a 89 y/o male presenting with lethargy, weakness and lower extremity edema. Pt admitted for acute CHF exacerbation, AKI and hyperkalemia.  PMH significant of HTN, HLD, CAD, PAD. CXR  Stable enlarged cardiac silhouette and chronic central vascular congestion, small bilateral pleural effusions, with left basilar atelectasis. Clinical Impression: Clinical Impression: Pt exhibited minimal pharyngeal dysphagia without aspiration during study. His strength and efficiency of swallow was within normal limits. There were 2 instances of mistimed swallow where barium spilled over epiglottis prior to initiation but ejected during the swallow (PAS 2) and barium spilled to pyriform sinuses and into laryngeal vestibule before laryngeal closure (PAS 3). Verbal cues to clear throat removed penetrate. Minimal vallecular residue with thin barium x 2 throughout study with spontaneous swallow cleared. Orally he controlled barium pill with thin to transit to  posterior oral cavity through PES and timely transit through GE junction. Straw use was effective with thin. His DIGEST score is a 1 (mild impairment). Pt may have instances where he coughs during meals however additional strategies not needed other than general aspiration precautions with proper positioning and small sips. Recommend he continue regular texture, thin liquids, pills (one at a time) with thin. No further ST is needed. DIGEST Swallow Severity Rating*  Safety:  Efficiency:  Overall Pharyngeal Swallow Severity: 1: mild; 2: moderate; 3: severe; 4: profound *The Dynamic Imaging Grade of Swallowing Toxicity is standardized for the head and neck cancer population, however, demonstrates promising clinical applications across populations to standardize the clinical rating of pharyngeal swallow safety and severity. Factors that may increase risk of adverse event in presence of aspiration Rubye Oaks & Clearance Coots 2021): No data recorded Recommendations/Plan: Swallowing Evaluation Recommendations Swallowing Evaluation Recommendations Recommendations: PO diet PO Diet Recommendation: Regular; Thin liquids (Level 0) Liquid Administration via: Straw; Cup Medication Administration: Whole meds with liquid (one at a time) Supervision: Staff to assist with self-feeding; Other (comment) (due to tremors) Swallowing strategies  : Slow rate; Small bites/sips Postural changes: Position pt fully upright  for meals Oral care recommendations: Oral care BID (2x/day) Treatment Plan Treatment Plan Treatment recommendations: No treatment recommended at this time Follow-up recommendations: No SLP follow up Functional status assessment: Patient has not had a recent decline in their functional status. Recommendations Recommendations for follow up therapy are one component of a multi-disciplinary discharge planning process, led by the attending physician.  Recommendations may be updated based on patient status, additional functional criteria  and insurance authorization. Assessment: Orofacial Exam: Orofacial Exam Oral Cavity: Oral Hygiene: WFL Oral Cavity - Dentition: Missing dentition (missing several lower) Orofacial Anatomy: WFL Oral Motor/Sensory Function: WFL Anatomy: Anatomy: WFL Boluses Administered: Boluses Administered Boluses Administered: Thin liquids (Level 0); Mildly thick liquids (Level 2, nectar thick); Moderately thick liquids (Level 3, honey thick); Puree; Solid  Oral Impairment Domain: Oral Impairment Domain Lip Closure: No labial escape Tongue control during bolus hold: Cohesive bolus between tongue to palatal seal Bolus preparation/mastication: Timely and efficient chewing and mashing Bolus transport/lingual motion: Brisk tongue motion Oral residue: Trace residue lining oral structures Location of oral residue : Tongue Initiation of pharyngeal swallow : Valleculae  Pharyngeal Impairment Domain: Pharyngeal Impairment Domain Soft palate elevation: No bolus between soft palate (SP)/pharyngeal wall (PW) Laryngeal elevation: Complete superior movement of thyroid cartilage with complete approximation of arytenoids to epiglottic petiole Anterior hyoid excursion: Complete anterior movement Epiglottic movement: Complete inversion Laryngeal vestibule closure: Complete, no air/contrast in laryngeal vestibule Pharyngeal stripping wave : Present - complete Pharyngeal contraction (A/P view only): N/A Pharyngoesophageal segment opening: Complete distension and complete duration, no obstruction of flow Tongue base retraction: No contrast between tongue base and posterior pharyngeal wall (PPW) Pharyngeal residue: Collection of residue within or on pharyngeal structures (with thin) Location of pharyngeal residue: Valleculae (with thin)  Esophageal Impairment Domain: Esophageal Impairment Domain Esophageal clearance upright position: Complete clearance, esophageal coating Pill: No data recorded Penetration/Aspiration Scale Score: Penetration/Aspiration  Scale Score 1.  Material does not enter airway: Mildly thick liquids (Level 2, nectar thick); Moderately thick liquids (Level 3, honey thick); Puree; Solid; Pill 2.  Material enters airway, remains ABOVE vocal cords then ejected out: Thin liquids (Level 0) 3.  Material enters airway, remains ABOVE vocal cords and not ejected out: Thin liquids (Level 0) Compensatory Strategies: Compensatory Strategies Compensatory strategies: Yes Straw: Effective Effective Straw: Thin liquid (Level 0)   General Information: Caregiver present: No  Diet Prior to this Study: Regular; Thin liquids (Level 0)   Temperature : Normal   Respiratory Status: WFL   Supplemental O2: None (Room air)   History of Recent Intubation: No  Behavior/Cognition: Alert; Cooperative; Pleasant mood Self-Feeding Abilities: Needs assist with self-feeding (due to tremors) Baseline vocal quality/speech: Normal Volitional Cough: Able to elicit Volitional Swallow: Able to elicit Exam Limitations: No limitations Goal Planning: Prognosis for improved oropharyngeal function: Good No data recorded No data recorded No data recorded Consulted and agree with results and recommendations: Patient; Physician; Nurse Pain: Pain Assessment Pain Assessment: Faces Faces Pain Scale: 0 End of Session: Start Time:SLP Start Time (ACUTE ONLY): 1236 Stop Time: SLP Stop Time (ACUTE ONLY): 1249 Time Calculation:SLP Time Calculation (min) (ACUTE ONLY): 13 min Charges: SLP Evaluations $ SLP Speech Visit: 1 Visit SLP Evaluations $BSS Swallow: 1 Procedure $MBS Swallow: 1 Procedure SLP visit diagnosis: SLP Visit Diagnosis: Dysphagia, pharyngeal phase (R13.13) Past Medical History: Past Medical History: Diagnosis Date  AAA (abdominal aortic aneurysm) (HCC)   asymptomatic  AAA (abdominal aortic aneurysm) (HCC) 2010  peripheral  angiogram-- bilateral SFA DISEASE  and 60 to  70% infrarenaal abd. aortic stenosis with 15 -mm gradient  Adenomatous colon polyp 11/2003  CAD (coronary artery disease)    Gait abnormality 02/13/2017  Gastroparesis   pt denies  GERD (gastroesophageal reflux disease)   w/ LPR  History of kidney stones   x2  Hyperlipidemia   Hypertension   IDA (iron deficiency anemia)   Peripheral arterial disease (HCC)   RCEA  by Dr [name]  PUD (peptic ulcer disease)   pt unaware  Vertigo  Past Surgical History: Past Surgical History: Procedure Laterality Date  ABDOMINAL AORTOGRAM W/LOWER EXTREMITY Bilateral 08/05/2018  Procedure: ABDOMINAL AORTOGRAM W/LOWER EXTREMITY;  Surgeon: Runell Gess, MD;  [facility]: [facility];  Service: Cardiovascular;  Laterality: Bilateral;  ABDOMINAL AORTOGRAM W/LOWER EXTREMITY N/A 09/26/2021  Procedure: ABDOMINAL AORTOGRAM W/LOWER EXTREMITY;  Surgeon: Runell Gess, MD;  Location: [facility];  Service: Cardiovascular;  Laterality: N/A;  AORTOGRAM  04/24/2016   Abdominal aortogram, bilateral iliac angiogram, bifemoral runoff  CARDIAC CATHETERIZATION  05/12/2005  RCA  carotid doppler  01/24/2013  RICA endarterectomy,left CCA 0-49%; left bulb and prox ICA 50-69%; bilaateral subclavian < 50%  CAROTID ENDARTERECTOMY  11/01/2010  CATARACT EXTRACTION    COLONOSCOPY    CORONARY ANGIOPLASTY  05/18/2005  2 STENTS distal RCA AND PROXIMAL-MID RCA   5 total stents per pt.  CYSTOSCOPY/URETEROSCOPY/HOLMIUM LASER/STENT PLACEMENT Left 01/11/2018  Procedure: LEFT URETEROSCOPY/HOLMIUM LASER/STENT PLACEMENT;  Surgeon: Crist Fat, MD;  Location: [facility];  Service: Urology;  Laterality: Left;  DOPPLER ECHOCARDIOGRAPHY  02/07/2012  EF 55%,SHOWED NO ISCHEMIA   HERNIA REPAIR    umbilical  lower arterial  doppler  02/04/2013  aotra 1.5 x 1.5 cm; distal abd aorta 70-99%,proximal common iliac arteries -very stenotic with increased velocities>50%,may be falsely elevated as a result of residual plaque from the distal aorta stenosis  lower extremity doppler  June 18 ,2013  ABI'S ABNORMAL, RABI was 0.88 and LABI 0.75 ,with 3-vessel  run off  NM MYOVIEW LTD  MAY 23,2011   showed no significant ischemia;  NM MYOVIEW LTD  04/22/2008  ef 77%,exercise capcity 6 METS ,exaggerated blood pressure response to exercise  PERIPHERAL VASCULAR CATHETERIZATION N/A 04/24/2016  Procedure: Lower Extremity Angiography;  Surgeon: Runell Gess, MD;  Location: [facility] INVASIVE CV LAB;  Service: Cardiovascular;  Laterality: N/A;  PERIPHERAL VASCULAR CATHETERIZATION  04/24/2016  Procedure: Peripheral Vascular Intervention;  Surgeon: Runell Gess, MD;  Location: [facility] INVASIVE CV LAB;  Service: Cardiovascular;;  Aorta  retrograde central aortic catheterization  05/19/2005  cutting balloon atherectomy, c-circ stenosis with DES STENTING CYPHER  TONSILLECTOMY    TOTAL HIP ARTHROPLASTY Right 07/11/2022  Procedure: TOTAL HIP ARTHROPLASTY ANTERIOR APPROACH;  Surgeon: Durene Romans, MD;  Location: [facility];  Service: Orthopedics;  Laterality: Right;  TOTAL KNEE ARTHROPLASTY Left 01/03/2019  Procedure: TOTAL KNEE ARTHROPLASTY;  Surgeon: Beverely Low, MD;  Location: [facility];  Service: Orthopedics;  Laterality: Left;  with IS block  TRANSURETHRAL RESECTION OF PROSTATE N/A 09/08/2016  Procedure: TRANSURETHRAL RESECTION OF THE PROSTATE (TURP);  Surgeon: Crist Fat, MD;  Location: [facility];  Service: Urology;  Laterality: N/A;  TRANSURETHRAL RESECTION OF PROSTATE    10-10-17  Dr. Marlou Porch  TRANSURETHRAL RESECTION OF PROSTATE N/A 10/10/2017  Procedure: TRANSURETHRAL RESECTION OF THE PROSTATE (TURP);  Surgeon: Crist Fat, MD;  Location: [facility];  Service: Urology;  Laterality: N/A;  VASECTOMY   Nathan Santiago 09/28/2023, 2:01 PM  DG Chest 2 View Result Date: 09/25/2023 CLINICAL DATA:  Short  of breath, lower extremity swelling EXAM: CHEST - 2 VIEW COMPARISON:  03/01/2023 FINDINGS: Frontal and lateral views of the chest demonstrate a stable cardiac silhouette. There is chronic central pulmonary vascular congestion. There are small bilateral pleural effusions. Minimal consolidation at the left lung base most  consistent with atelectasis. No pneumothorax. No acute bony abnormalities. IMPRESSION: 1. Stable enlarged cardiac silhouette and chronic central vascular congestion. 2. Small bilateral pleural effusions, with left basilar atelectasis. Electronically Signed   By: Sharlet Salina M.D.   On: 09/25/2023 22:21   ECHOCARDIOGRAM COMPLETE Result Date: 09/14/2023    ECHOCARDIOGRAM REPORT   Patient Name:   Nathan Santiago [facility] Date of Exam: 09/14/2023 Medical Rec #:  [id number]         Height:       64.0 in Accession #:    [id number]        Weight:       182.8 lb Date of Birth:  1930-12-06         BSA:          1.883 m Patient Age:    89 years          BP:           128/64 mmHg Patient Gender: M                 HR:           57 bpm. Exam Location:  [address] Procedure: 2D Echo, 3D Echo and Strain Analysis (Both Spectral and Color Flow            Doppler were utilized during procedure). Indications:    R60.0 Lower extremity edema  History:        Patient has prior history of Echocardiogram examinations, most                 recent 02/07/2012. CAD, PAD; Risk Factors:Hypertension and                 Dyslipidemia. Carotid artery disease.  Sonographer:    Cathie Beams RCS Referring Phys: HAO MENG IMPRESSIONS  1. Left ventricular ejection fraction, by estimation, is 60 to 65%. Left ventricular ejection fraction by 3D volume is 63 %. The left ventricle has normal function. The left ventricle has no regional wall motion abnormalities. There is mild concentric left ventricular hypertrophy. Left ventricular diastolic parameters are consistent with Grade I diastolic dysfunction (impaired relaxation). The average left ventricular global longitudinal strain is -24.2 %. The global longitudinal strain is normal.  2. Right ventricular systolic function is normal. The right ventricular size is normal.  3. Left atrial size was mildly dilated.  4. The mitral valve is normal in structure. Trivial mitral valve regurgitation. No evidence of  mitral stenosis.  5. The aortic valve is tricuspid. There is mild calcification of the aortic valve. Aortic valve regurgitation is not visualized. Aortic valve sclerosis/calcification is present, without any evidence of aortic stenosis.  6. The inferior vena cava is normal in size with <50% respiratory variability, suggesting right atrial pressure of 8 mmHg. FINDINGS  Left Ventricle: Left ventricular ejection fraction, by estimation, is 60 to 65%. Left ventricular ejection fraction by 3D volume is 63 %. The left ventricle has normal function. The left ventricle has no regional wall motion abnormalities. The average left ventricular global longitudinal strain is -24.2 %. Strain was performed and the global longitudinal strain is normal. The left ventricular internal cavity size was normal in size. There is mild  concentric left ventricular hypertrophy. Left ventricular diastolic parameters are consistent with Grade I diastolic dysfunction (impaired relaxation). Right Ventricle: The right ventricular size is normal. No increase in right ventricular wall thickness. Right ventricular systolic function is normal. Left Atrium: Left atrial size was mildly dilated. Right Atrium: Right atrial size was normal in size. Pericardium: There is no evidence of pericardial effusion. Mitral Valve: The mitral valve is normal in structure. Mild mitral annular calcification. Trivial mitral valve regurgitation. No evidence of mitral valve stenosis. Tricuspid Valve: The tricuspid valve is normal in structure. Tricuspid valve regurgitation is trivial. No evidence of tricuspid stenosis. Aortic Valve: The aortic valve is tricuspid. There is mild calcification of the aortic valve. Aortic valve regurgitation is not visualized. Aortic valve sclerosis/calcification is present, without any evidence of aortic stenosis. Pulmonic Valve: The pulmonic valve was normal in structure. Pulmonic valve regurgitation is mild. No evidence of pulmonic stenosis.  Aorta: The aortic root is normal in size and structure. Venous: The inferior vena cava is normal in size with less than 50% respiratory variability, suggesting right atrial pressure of 8 mmHg. IAS/Shunts: No atrial level shunt detected by color flow Doppler. Additional Comments: 3D was performed not requiring image post processing on an independent workstation and was normal.  LEFT VENTRICLE PLAX 2D LVIDd:         4.70 cm         Diastology LVIDs:         2.40 cm         LV e' medial:    5.18 cm/s LV PW:         1.20 cm         LV E/e' medial:  17.1 LV IVS:        1.10 cm         LV e' lateral:   6.23 cm/s LVOT diam:     2.00 cm         LV E/e' lateral: 14.2 LV SV:         78 LV SV Index:   42              2D LVOT Area:     3.14 cm        Longitudinal                                Strain                                2D Strain GLS  -24.2 %                                Avg:                                 3D Volume EF                                LV 3D EF:    Left  ventricul                                             ar                                             ejection                                             fraction                                             by 3D                                             volume is                                             63 %.                                 3D Volume EF:                                3D EF:        63 %                                LV EDV:       82 ml                                LV ESV:       30 ml                                LV SV:        51 ml RIGHT VENTRICLE RV Basal diam:  3.30 cm RV S prime:     10.80 cm/s TAPSE (M-mode): 1.6 cm LEFT ATRIUM             Index        RIGHT ATRIUM           Index LA diam:        5.00 cm 2.66 cm/m   RA Area:     22.50 cm LA Vol (A2C):   54.7 ml 29.05 ml/m  RA Volume:   65.90 ml  35.00 ml/m LA Vol (A4C):   53.4 ml 28.36 ml/m LA Biplane Vol: 58.9 ml 31.28  ml/m  AORTIC VALVE  PULMONIC VALVE LVOT Vmax:   82.20 cm/s  PR End Diast Vel: 7.18 msec LVOT Vmean:  50.900 cm/s LVOT VTI:    0.249 m  AORTA Ao Root diam: 3.30 cm Ao Asc diam:  3.20 cm MITRAL VALVE MV Area (PHT): 2.62 cm    SHUNTS MV Decel Time: 290 msec    Systemic VTI:  0.25 m MV E velocity: 88.60 cm/s  Systemic Diam: 2.00 cm MV A velocity: 96.40 cm/s MV E/A ratio:  0.92 Arvilla Meres MD Electronically signed by Arvilla Meres MD Signature Date/Time: 09/14/2023/12:04:16 PM    Final     Assessment/Plan 1. Acute on chronic diastolic heart failure (HCC) (Primary) - admission BNP 488.2 - improved after diuresis - appears compensated today - not on diuretics  2. AKI (acute kidney injury) (HCC) - BUN/creat 48/1.83 - improved after diuretics stopped - discharged BUN/creat 32/1.16  3. PAF (paroxysmal atrial fibrillation) (HCC) - noted to be associated with UTI infection - converted with amiodarone - cont metoprolol - Eliquis started  4. Renal artery stenosis (HCC) - followed by vascular surgery> femoral endarterectomy not recommended - Plavix stopped  5. Essential (primary) hypertension - cont losartan, metoprolol, clonidine, amlodipine and hydralazine     Family/ staff Communication: plan discussed with patient and nurse  Labs/tests ordered:  bmp 03/11

## 2023-10-08 ENCOUNTER — Non-Acute Institutional Stay (SKILLED_NURSING_FACILITY): Payer: Self-pay | Admitting: Internal Medicine

## 2023-10-08 ENCOUNTER — Encounter: Payer: Self-pay | Admitting: Internal Medicine

## 2023-10-08 DIAGNOSIS — N179 Acute kidney failure, unspecified: Secondary | ICD-10-CM

## 2023-10-08 DIAGNOSIS — I480 Paroxysmal atrial fibrillation: Secondary | ICD-10-CM | POA: Diagnosis not present

## 2023-10-08 DIAGNOSIS — I701 Atherosclerosis of renal artery: Secondary | ICD-10-CM | POA: Diagnosis not present

## 2023-10-08 DIAGNOSIS — I5033 Acute on chronic diastolic (congestive) heart failure: Secondary | ICD-10-CM

## 2023-10-08 DIAGNOSIS — N1832 Chronic kidney disease, stage 3b: Secondary | ICD-10-CM

## 2023-10-08 DIAGNOSIS — I10 Essential (primary) hypertension: Secondary | ICD-10-CM | POA: Diagnosis not present

## 2023-10-08 DIAGNOSIS — D509 Iron deficiency anemia, unspecified: Secondary | ICD-10-CM | POA: Diagnosis not present

## 2023-10-08 DIAGNOSIS — G629 Polyneuropathy, unspecified: Secondary | ICD-10-CM | POA: Diagnosis not present

## 2023-10-08 DIAGNOSIS — E782 Mixed hyperlipidemia: Secondary | ICD-10-CM

## 2023-10-08 DIAGNOSIS — N189 Chronic kidney disease, unspecified: Secondary | ICD-10-CM | POA: Diagnosis not present

## 2023-10-08 NOTE — Progress Notes (Signed)
 Location:  Medical illustrator of Service:  SNF ([id number])  Provider:   Code Status: DNR Goals of Care:     10/02/2023   11:27 AM  Advanced Directives  Does Patient Have a Medical Advance Directive? Yes  Type of Estate agent of [place];Living will  Does patient want to make changes to medical advance directive? No - Patient declined  Copy of Healthcare Power of Attorney in Chart? Yes - validated most recent copy scanned in chart (See row information)     Chief Complaint  Patient presents with   RE Admit To SNF   Acute Visit    HPI: Patient is a 89 y.o. male seen today for medical management of chronic diseases.   Patient was admitted in the hospital from Skilled 02/25- 03/03  He also Has h/o   hyperlipidemia, PAD, CAD h/o Urinary Retention with Hydronephrosis and stent  Hx of AAA ICA stenosis and PAD, followed by Dr Allyson Sabal. Last carotid US showing bilateral ICA stenosis   Mild Cognitive impairment MMSE 27/30 in 03/24 S/p THA in 12/23  Patient was send to ED for Elevated BNP and [facility] found New diagnosis of A Fib with RVR Started on Eliquis Also Developed Delirium and was found to have Pseudomonas UTI treated with Antibiotics For CHF was  Diuresed Now back in Facility No Comlains today BP    Past Medical History:  Diagnosis Date   AAA (abdominal aortic aneurysm) (HCC)    asymptomatic   AAA (abdominal aortic aneurysm) (HCC) 2010   peripheral  angiogram-- bilateral SFA DISEASE  and 60 to 70% infrarenaal abd. aortic stenosis with 15 -mm gradient   Adenomatous colon polyp 11/2003   CAD (coronary artery disease)    Gait abnormality 02/13/2017   Gastroparesis    pt denies   GERD (gastroesophageal reflux disease)    w/ LPR   History of kidney stones    x2   Hyperlipidemia    Hypertension    IDA (iron deficiency anemia)    Peripheral arterial disease (HCC)    RCEA  by Dr [name]   PUD (peptic ulcer  disease)    pt unaware   Vertigo     Past Surgical History:  Procedure Laterality Date   ABDOMINAL AORTOGRAM W/LOWER EXTREMITY Bilateral 08/05/2018   Procedure: ABDOMINAL AORTOGRAM W/LOWER EXTREMITY;  Surgeon: Runell Gess, MD;  Location: [facility];  Service: Cardiovascular;  Laterality: Bilateral;   ABDOMINAL AORTOGRAM W/LOWER EXTREMITY N/A 09/26/2021   Procedure: ABDOMINAL AORTOGRAM W/LOWER EXTREMITY;  Surgeon: Runell Gess, MD;  Location: [facility];  Service: Cardiovascular;  Laterality: N/A;   AORTOGRAM  04/24/2016    Abdominal aortogram, bilateral iliac angiogram, bifemoral runoff   CARDIAC CATHETERIZATION  05/12/2005   RCA   carotid doppler  01/24/2013   RICA endarterectomy,left CCA 0-49%; left bulb and prox ICA 50-69%; bilaateral subclavian < 50%   CAROTID ENDARTERECTOMY  11/01/2010   CATARACT EXTRACTION     COLONOSCOPY     CORONARY ANGIOPLASTY  05/18/2005   2 STENTS distal RCA AND PROXIMAL-MID RCA   5 total stents per pt.   CYSTOSCOPY/URETEROSCOPY/HOLMIUM LASER/STENT PLACEMENT Left 01/11/2018   Procedure: LEFT URETEROSCOPY/HOLMIUM LASER/STENT PLACEMENT;  Surgeon: Crist Fat, MD;  Location: [facility];  Service: Urology;  Laterality: Left;   DOPPLER ECHOCARDIOGRAPHY  02/07/2012   EF 55%,SHOWED NO ISCHEMIA    HERNIA REPAIR     umbilical   lower arterial  doppler  02/04/2013   aotra 1.5 x 1.5 cm; distal abd aorta 70-99%,proximal common iliac arteries -very stenotic with increased velocities>50%,may be falsely elevated as a result of residual plaque from the distal aorta stenosis   lower extremity doppler  June 18 ,2013   ABI'S ABNORMAL, RABI was 0.88 and LABI 0.75 ,with 3-vessel  run off   NM MYOVIEW LTD  MAY 23,2011   showed no significant ischemia;   NM MYOVIEW LTD  04/22/2008   ef 77%,exercise capcity 6 METS ,exaggerated blood pressure response to exercise   PERIPHERAL VASCULAR CATHETERIZATION N/A 04/24/2016   Procedure: Lower Extremity Angiography;   Surgeon: Runell Gess, MD;  Location: [facility] INVASIVE CV LAB;  Service: Cardiovascular;  Laterality: N/A;   PERIPHERAL VASCULAR CATHETERIZATION  04/24/2016   Procedure: Peripheral Vascular Intervention;  Surgeon: Runell Gess, MD;  Location: [facility] INVASIVE CV LAB;  Service: Cardiovascular;;  Aorta   retrograde central aortic catheterization  05/19/2005   cutting balloon atherectomy, c-circ stenosis with DES STENTING CYPHER   TONSILLECTOMY     TOTAL HIP ARTHROPLASTY Right 07/11/2022   Procedure: TOTAL HIP ARTHROPLASTY ANTERIOR APPROACH;  Surgeon: Durene Romans, MD;  Location: [facility];  Service: Orthopedics;  Laterality: Right;   TOTAL KNEE ARTHROPLASTY Left 01/03/2019   Procedure: TOTAL KNEE ARTHROPLASTY;  Surgeon: Beverely Low, MD;  Location: [facility];  Service: Orthopedics;  Laterality: Left;  with IS block   TRANSURETHRAL RESECTION OF PROSTATE N/A 09/08/2016   Procedure: TRANSURETHRAL RESECTION OF THE PROSTATE (TURP);  Surgeon: Crist Fat, MD;  Location: [facility];  Service: Urology;  Laterality: N/A;   TRANSURETHRAL RESECTION OF PROSTATE     10-10-17  Dr. Marlou Porch   TRANSURETHRAL RESECTION OF PROSTATE N/A 10/10/2017   Procedure: TRANSURETHRAL RESECTION OF THE PROSTATE (TURP);  Surgeon: Crist Fat, MD;  Location: [facility];  Service: Urology;  Laterality: N/A;   VASECTOMY      Allergies  Allergen Reactions   Lisinopril Cough   Niaspan [Niacin Er (Antihyperlipidemic)] Rash    Outpatient Encounter Medications as of 10/08/2023  Medication Sig   acetaminophen (TYLENOL) 500 MG tablet Take 1 tablet (500 mg total) by mouth 2 (two) times daily as needed.   amLODipine (NORVASC) 10 MG tablet Take 10 mg by mouth daily.   apixaban (ELIQUIS) 5 MG TABS tablet Take 1 tablet (5 mg total) by mouth 2 (two) times daily.   Apoaequorin (PREVAGEN PO) Take 1 tablet by mouth in the morning.   atorvastatin (LIPITOR) 40 MG tablet Take 40 mg by mouth daily at 6 PM.    Biotin 10 MG TABS Take 10 mg by mouth  in the morning.   cetirizine (ZYRTEC) 10 MG tablet Take 1 tablet (10 mg total) by mouth at bedtime.   chlorthalidone (HYGROTON) 50 MG tablet Take 50 mg by mouth in the morning. Hold if BP < 95   cholecalciferol (VITAMIN D3) 25 MCG (1000 UT) tablet Take 1,000 Units by mouth in the morning.   cloNIDine (CATAPRES) 0.1 MG tablet Take 1 tablet (0.1 mg total) by mouth 3 (three) times daily. (Patient taking differently: Take 0.1 mg by mouth 3 (three) times daily. Hold if BP < 95)   finasteride (PROSCAR) 5 MG tablet Take 5 mg by mouth in the morning.   gabapentin (NEURONTIN) 100 MG capsule Take 100 mg by mouth 3 (three) times daily.   Glucosamine-Chondroitin 750-600 MG TABS Take 1 tablet by mouth 2 (two) times daily.   hydrALAZINE (APRESOLINE) 25 MG tablet Take 25  mg by mouth in the morning and at bedtime. Hold if BP < 95   hydrALAZINE (APRESOLINE) 50 MG tablet Take 50 mg by mouth every evening. Hold if BP <95   iron polysaccharides (NIFEREX) 150 MG capsule Take 150 mg by mouth every Monday, Wednesday, and Friday.   ketorolac (ACULAR) 0.5 % ophthalmic solution Place 1 drop into both eyes 4 (four) times daily.   losartan (COZAAR) 50 MG tablet Take 50 mg by mouth 2 (two) times daily.   melatonin 5 MG TABS Take 1 tablet (5 mg total) by mouth at bedtime.   metoprolol tartrate (LOPRESSOR) 50 MG tablet Take 75 mg by mouth 2 (two) times daily.   Multiple Vitamin (MULTIVITAMIN WITH MINERALS) TABS tablet Take 1 tablet by mouth in the morning.   pantoprazole (PROTONIX) 40 MG tablet Take 40 mg by mouth 2 (two) times daily.   potassium chloride SA (K-DUR,KLOR-CON) 20 MEQ tablet Take 20 mEq by mouth every evening.    tamsulosin (FLOMAX) 0.4 MG CAPS capsule Take 1 capsule (0.4 mg total) by mouth daily after supper.   No facility-administered encounter medications on file as of 10/08/2023.    Review of Systems:  Review of Systems  Constitutional:  Negative for activity change, appetite change and unexpected weight  change.  HENT: Negative.    Respiratory:  Negative for cough and shortness of breath.   Cardiovascular:  Negative for leg swelling.  Gastrointestinal:  Negative for constipation.  Genitourinary:  Negative for frequency.  Musculoskeletal:  Positive for gait problem. Negative for arthralgias and myalgias.  Skin: Negative.  Negative for rash.  Neurological:  Negative for dizziness and weakness.  Psychiatric/Behavioral:  Negative for confusion and sleep disturbance.   All other systems reviewed and are negative.   Health Maintenance  Topic Date Due   Pneumonia Vaccine 87+ Years old (3 of 3 - PCV) 09/25/2019   COVID-19 Vaccine (7 - 2024-25 season) 07/17/2023   DTaP/Tdap/Td (2 - Tdap) 09/09/2023   Medicare Annual Wellness (AWV)  11/28/2023   INFLUENZA VACCINE  Completed   Zoster Vaccines- Shingrix  Completed   HPV VACCINES  Aged Out    Physical Exam: Vitals:   10/08/23 1956  BP: (!) 130/55  Pulse: 65  Resp: 19  Temp: (!) 97.5 F (36.4 C)  Weight: 176 lb (79.8 kg)   Body mass index is 30.21 kg/m. Physical Exam Vitals reviewed.  Constitutional:      Appearance: Normal appearance.  HENT:     Head: Normocephalic.     Nose: Nose normal.     Mouth/Throat:     Mouth: Mucous membranes are moist.     Pharynx: Oropharynx is clear.  Eyes:     Pupils: Pupils are equal, round, and reactive to light.  Cardiovascular:     Rate and Rhythm: Normal rate and regular rhythm.     Pulses: Normal pulses.     Heart sounds: No murmur heard. Pulmonary:     Effort: Pulmonary effort is normal. No respiratory distress.     Breath sounds: Normal breath sounds. No rales.  Abdominal:     General: Abdomen is flat. Bowel sounds are normal.     Palpations: Abdomen is soft.  Musculoskeletal:        General: No swelling.     Cervical back: Neck supple.  Skin:    General: Skin is warm.  Neurological:     General: No focal deficit present.     Mental Status: He is alert and oriented  to person,  place, and time.  Psychiatric:        Mood and Affect: Mood normal.        Thought Content: Thought content normal.     Labs reviewed: Basic Metabolic Panel: Recent Labs    06/18/23 0000 07/30/23 0000 09/28/23 0236 09/29/23 0752 09/30/23 0231 10/01/23 0234  NA 144   < > 142 144 142 140  K 4.3   < > 3.7 3.7 3.4* 3.6  CL 110*   < > 106 108 107 106  CO2 23*   < > 26 25 24 24   GLUCOSE  --    < > 120* 127* 111* 102*  BUN 29*   < > 49* 40* 37* 32*  CREATININE 1.3   < > 1.99* 1.39* 1.37* 1.16  CALCIUM 9.4   < > 8.7* 9.2 8.8* 8.8*  MG  --   --  2.1  --   --  2.2  TSH 6.02*  --   --   --   --   --    < > = values in this interval not displayed.   Liver Function Tests: Recent Labs    10/17/22 0000 06/18/23 0000  AST 16 16  ALT 15 15  ALKPHOS 111 91  ALBUMIN 4.1 3.9   No results for input(s): "LIPASE", "AMYLASE" in the last 8760 hours. No results for input(s): "AMMONIA" in the last 8760 hours. CBC: Recent Labs    09/25/23 2212 09/27/23 0228 09/29/23 0752 09/30/23 0231  WBC 7.4 9.3 9.4 7.1  NEUTROABS 5.6  --   --   --   HGB 11.7* 10.3* 11.1* 10.0*  HCT 36.7* 31.0* 33.3* 30.9*  MCV 91.5 88.3 88.6 90.4  PLT 181 187 185 184   Lipid Panel: Recent Labs    10/17/22 0000  CHOL 133  HDL 49  LDLCALC 65  TRIG 122   Lab Results  Component Value Date   HGBA1C 5.5 11/23/2016    Procedures since last visit: DG CHEST PORT 1 VIEW Result Date: 09/28/2023 CLINICAL DATA:  Cough EXAM: PORTABLE CHEST 1 VIEW COMPARISON:  X-ray 09/25/2023 FINDINGS: No consolidation, pneumothorax or edema. Tiny left pleural effusion with blunting of the left costophrenic angle. Normal cardiopericardial silhouette. Calcified aorta. Overlapping cardiac leads. Degenerative changes of the spine. IMPRESSION: Tiny left effusion versus pleural thickening. No consolidation or edema Electronically Signed   By: Karen Kays M.D.   On: 09/28/2023 16:40   DG Swallowing Func-Speech Pathology Result Date:  09/28/2023 Table formatting from the original result was not included. Modified Barium Swallow Study Patient Details Name: Nathan Santiago MRN: [id number] Date of Birth: 11/23/1930 Today's Date: 09/28/2023 HPI/PMH: HPI: Pt is a 89 y/o male presenting with lethargy, weakness and lower extremity edema. Pt admitted for acute CHF exacerbation, AKI and hyperkalemia.  PMH significant of HTN, HLD, CAD, PAD. CXR  Stable enlarged cardiac silhouette and chronic central vascular congestion, small bilateral pleural effusions, with left basilar atelectasis. Clinical Impression: Clinical Impression: Pt exhibited minimal pharyngeal dysphagia without aspiration during study. His strength and efficiency of swallow was within normal limits. There were 2 instances of mistimed swallow where barium spilled over epiglottis prior to initiation but ejected during the swallow (PAS 2) and barium spilled to pyriform sinuses and into laryngeal vestibule before laryngeal closure (PAS 3). Verbal cues to clear throat removed penetrate. Minimal vallecular residue with thin barium x 2 throughout study with spontaneous swallow cleared. Orally he controlled barium pill with thin to  transit to posterior oral cavity through PES and timely transit through GE junction. Straw use was effective with thin. His DIGEST score is a 1 (mild impairment). Pt may have instances where he coughs during meals however additional strategies not needed other than general aspiration precautions with proper positioning and small sips. Recommend he continue regular texture, thin liquids, pills (one at a time) with thin. No further ST is needed. DIGEST Swallow Severity Rating*  Safety:  Efficiency:  Overall Pharyngeal Swallow Severity: 1: mild; 2: moderate; 3: severe; 4: profound *The Dynamic Imaging Grade of Swallowing Toxicity is standardized for the head and neck cancer population, however, demonstrates promising clinical applications across populations to standardize the  clinical rating of pharyngeal swallow safety and severity. Factors that may increase risk of adverse event in presence of aspiration Rubye Oaks & Clearance Coots 2021): No data recorded Recommendations/Plan: Swallowing Evaluation Recommendations Swallowing Evaluation Recommendations Recommendations: PO diet PO Diet Recommendation: Regular; Thin liquids (Level 0) Liquid Administration via: Straw; Cup Medication Administration: Whole meds with liquid (one at a time) Supervision: Staff to assist with self-feeding; Other (comment) (due to tremors) Swallowing strategies  : Slow rate; Small bites/sips Postural changes: Position pt fully upright for meals Oral care recommendations: Oral care BID (2x/day) Treatment Plan Treatment Plan Treatment recommendations: No treatment recommended at this time Follow-up recommendations: No SLP follow up Functional status assessment: Patient has not had a recent decline in their functional status. Recommendations Recommendations for follow up therapy are one component of a multi-disciplinary discharge planning process, led by the attending physician.  Recommendations may be updated based on patient status, additional functional criteria and insurance authorization. Assessment: Orofacial Exam: Orofacial Exam Oral Cavity: Oral Hygiene: WFL Oral Cavity - Dentition: Missing dentition (missing several lower) Orofacial Anatomy: WFL Oral Motor/Sensory Function: WFL Anatomy: Anatomy: WFL Boluses Administered: Boluses Administered Boluses Administered: Thin liquids (Level 0); Mildly thick liquids (Level 2, nectar thick); Moderately thick liquids (Level 3, honey thick); Puree; Solid  Oral Impairment Domain: Oral Impairment Domain Lip Closure: No labial escape Tongue control during bolus hold: Cohesive bolus between tongue to palatal seal Bolus preparation/mastication: Timely and efficient chewing and mashing Bolus transport/lingual motion: Brisk tongue motion Oral residue: Trace residue lining oral  structures Location of oral residue : Tongue Initiation of pharyngeal swallow : Valleculae  Pharyngeal Impairment Domain: Pharyngeal Impairment Domain Soft palate elevation: No bolus between soft palate (SP)/pharyngeal wall (PW) Laryngeal elevation: Complete superior movement of thyroid cartilage with complete approximation of arytenoids to epiglottic petiole Anterior hyoid excursion: Complete anterior movement Epiglottic movement: Complete inversion Laryngeal vestibule closure: Complete, no air/contrast in laryngeal vestibule Pharyngeal stripping wave : Present - complete Pharyngeal contraction (A/P view only): N/A Pharyngoesophageal segment opening: Complete distension and complete duration, no obstruction of flow Tongue base retraction: No contrast between tongue base and posterior pharyngeal wall (PPW) Pharyngeal residue: Collection of residue within or on pharyngeal structures (with thin) Location of pharyngeal residue: Valleculae (with thin)  Esophageal Impairment Domain: Esophageal Impairment Domain Esophageal clearance upright position: Complete clearance, esophageal coating Pill: No data recorded Penetration/Aspiration Scale Score: Penetration/Aspiration Scale Score 1.  Material does not enter airway: Mildly thick liquids (Level 2, nectar thick); Moderately thick liquids (Level 3, honey thick); Puree; Solid; Pill 2.  Material enters airway, remains ABOVE vocal cords then ejected out: Thin liquids (Level 0) 3.  Material enters airway, remains ABOVE vocal cords and not ejected out: Thin liquids (Level 0) Compensatory Strategies: Compensatory Strategies Compensatory strategies: Yes Straw: Effective Effective Straw: Thin liquid (Level 0)  General Information: Caregiver present: No  Diet Prior to this Study: Regular; Thin liquids (Level 0)   Temperature : Normal   Respiratory Status: WFL   Supplemental O2: None (Room air)   History of Recent Intubation: No  Behavior/Cognition: Alert; Cooperative; Pleasant mood  Self-Feeding Abilities: Needs assist with self-feeding (due to tremors) Baseline vocal quality/speech: Normal Volitional Cough: Able to elicit Volitional Swallow: Able to elicit Exam Limitations: No limitations Goal Planning: Prognosis for improved oropharyngeal function: Good No data recorded No data recorded No data recorded Consulted and agree with results and recommendations: Patient; Physician; Nurse Pain: Pain Assessment Pain Assessment: Faces Faces Pain Scale: 0 End of Session: Start Time:SLP Start Time (ACUTE ONLY): 1236 Stop Time: SLP Stop Time (ACUTE ONLY): 1249 Time Calculation:SLP Time Calculation (min) (ACUTE ONLY): 13 min Charges: SLP Evaluations $ SLP Speech Visit: 1 Visit SLP Evaluations $BSS Swallow: 1 Procedure $MBS Swallow: 1 Procedure SLP visit diagnosis: SLP Visit Diagnosis: Dysphagia, pharyngeal phase (R13.13) Past Medical History: Past Medical History: Diagnosis Date  AAA (abdominal aortic aneurysm) (HCC)   asymptomatic  AAA (abdominal aortic aneurysm) (HCC) 2010  peripheral  angiogram-- bilateral SFA DISEASE  and 60 to 70% infrarenaal abd. aortic stenosis with 15 -mm gradient  Adenomatous colon polyp 11/2003  CAD (coronary artery disease)   Gait abnormality 02/13/2017  Gastroparesis   pt denies  GERD (gastroesophageal reflux disease)   w/ LPR  History of kidney stones   x2  Hyperlipidemia   Hypertension   IDA (iron deficiency anemia)   Peripheral arterial disease (HCC)   RCEA  by Dr [name]  PUD (peptic ulcer disease)   pt unaware  Vertigo  Past Surgical History: Past Surgical History: Procedure Laterality Date  ABDOMINAL AORTOGRAM W/LOWER EXTREMITY Bilateral 08/05/2018  Procedure: ABDOMINAL AORTOGRAM W/LOWER EXTREMITY;  Surgeon: Runell Gess, MD;  Location: [facility];  Service: Cardiovascular;  Laterality: Bilateral;  ABDOMINAL AORTOGRAM W/LOWER EXTREMITY N/A 09/26/2021  Procedure: ABDOMINAL AORTOGRAM W/LOWER EXTREMITY;  Surgeon: Runell Gess, MD;  Location: [facility]  INVASIVE CV LAB;  Service: Cardiovascular;  Laterality: N/A;  AORTOGRAM  04/24/2016   Abdominal aortogram, bilateral iliac angiogram, bifemoral runoff  CARDIAC CATHETERIZATION  05/12/2005  RCA  carotid doppler  01/24/2013  RICA endarterectomy,left CCA 0-49%; left bulb and prox ICA 50-69%; bilaateral subclavian < 50%  CAROTID ENDARTERECTOMY  11/01/2010  CATARACT EXTRACTION    COLONOSCOPY    CORONARY ANGIOPLASTY  05/18/2005  2 STENTS distal RCA AND PROXIMAL-MID RCA   5 total stents per pt.  CYSTOSCOPY/URETEROSCOPY/HOLMIUM LASER/STENT PLACEMENT Left 01/11/2018  Procedure: LEFT URETEROSCOPY/HOLMIUM LASER/STENT PLACEMENT;  Surgeon: Crist Fat, MD;  Location: [facility];  Service: Urology;  Laterality: Left;  DOPPLER ECHOCARDIOGRAPHY  02/07/2012  EF 55%,SHOWED NO ISCHEMIA   HERNIA REPAIR    umbilical  lower arterial  doppler  02/04/2013  aotra 1.5 x 1.5 cm; distal abd aorta 70-99%,proximal common iliac arteries -very stenotic with increased velocities>50%,may be falsely elevated as a result of residual plaque from the distal aorta stenosis  lower extremity doppler  June 18 ,2013  ABI'S ABNORMAL, RABI was 0.88 and LABI 0.75 ,with 3-vessel  run off  NM MYOVIEW LTD  MAY 23,2011  showed no significant ischemia;  NM MYOVIEW LTD  04/22/2008  ef 77%,exercise capcity 6 METS ,exaggerated blood pressure response to exercise  PERIPHERAL VASCULAR CATHETERIZATION N/A 04/24/2016  Procedure: Lower Extremity Angiography;  Surgeon: Runell Gess, MD;  Location: [facility] INVASIVE CV LAB;  Service: Cardiovascular;  Laterality: N/A;  PERIPHERAL  VASCULAR CATHETERIZATION  04/24/2016  Procedure: Peripheral Vascular Intervention;  Surgeon: Runell Gess, MD;  Location: [facility] INVASIVE CV LAB;  Service: Cardiovascular;;  Aorta  retrograde central aortic catheterization  05/19/2005  cutting balloon atherectomy, c-circ stenosis with DES STENTING CYPHER  TONSILLECTOMY    TOTAL HIP ARTHROPLASTY Right 07/11/2022  Procedure: TOTAL HIP ARTHROPLASTY ANTERIOR  APPROACH;  Surgeon: Durene Romans, MD;  Location: [facility];  Service: Orthopedics;  Laterality: Right;  TOTAL KNEE ARTHROPLASTY Left 01/03/2019  Procedure: TOTAL KNEE ARTHROPLASTY;  Surgeon: Beverely Low, MD;  Location: [facility];  Service: Orthopedics;  Laterality: Left;  with IS block  TRANSURETHRAL RESECTION OF PROSTATE N/A 09/08/2016  Procedure: TRANSURETHRAL RESECTION OF THE PROSTATE (TURP);  Surgeon: Crist Fat, MD;  Location: [facility];  Service: Urology;  Laterality: N/A;  TRANSURETHRAL RESECTION OF PROSTATE    10-10-17  Dr. Marlou Porch  TRANSURETHRAL RESECTION OF PROSTATE N/A 10/10/2017  Procedure: TRANSURETHRAL RESECTION OF THE PROSTATE (TURP);  Surgeon: Crist Fat, MD;  Location: [facility];  Service: Urology;  Laterality: N/A;  VASECTOMY   Royce Macadamia 09/28/2023, 2:01 PM  DG Chest 2 View Result Date: 09/25/2023 CLINICAL DATA:  Short of breath, lower extremity swelling EXAM: CHEST - 2 VIEW COMPARISON:  03/01/2023 FINDINGS: Frontal and lateral views of the chest demonstrate a stable cardiac silhouette. There is chronic central pulmonary vascular congestion. There are small bilateral pleural effusions. Minimal consolidation at the left lung base most consistent with atelectasis. No pneumothorax. No acute bony abnormalities. IMPRESSION: 1. Stable enlarged cardiac silhouette and chronic central vascular congestion. 2. Small bilateral pleural effusions, with left basilar atelectasis. Electronically Signed   By: Sharlet Salina M.D.   On: 09/25/2023 22:21   ECHOCARDIOGRAM COMPLETE Result Date: 09/14/2023    ECHOCARDIOGRAM REPORT   Patient Name:   Nathan Santiago [facility] Date of Exam: 09/14/2023 Medical Rec #:  [id number]         Height:       64.0 in Accession #:    [id number]        Weight:       182.8 lb Date of Birth:  11/17/1930         BSA:          1.883 m Patient Age:    89 years          BP:           128/64 mmHg Patient Gender: M                 HR:           57 bpm. Exam Location:  [address]  Procedure: 2D Echo, 3D Echo and Strain Analysis (Both Spectral and Color Flow            Doppler were utilized during procedure). Indications:    R60.0 Lower extremity edema  History:        Patient has prior history of Echocardiogram examinations, most                 recent 02/07/2012. CAD, PAD; Risk Factors:Hypertension and                 Dyslipidemia. Carotid artery disease.  Sonographer:    Cathie Beams RCS Referring Phys: HAO MENG IMPRESSIONS  1. Left ventricular ejection fraction, by estimation, is 60 to 65%. Left ventricular ejection fraction by 3D volume is 63 %. The left ventricle has normal function. The left ventricle has no regional wall motion abnormalities.  There is mild concentric left ventricular hypertrophy. Left ventricular diastolic parameters are consistent with Grade I diastolic dysfunction (impaired relaxation). The average left ventricular global longitudinal strain is -24.2 %. The global longitudinal strain is normal.  2. Right ventricular systolic function is normal. The right ventricular size is normal.  3. Left atrial size was mildly dilated.  4. The mitral valve is normal in structure. Trivial mitral valve regurgitation. No evidence of mitral stenosis.  5. The aortic valve is tricuspid. There is mild calcification of the aortic valve. Aortic valve regurgitation is not visualized. Aortic valve sclerosis/calcification is present, without any evidence of aortic stenosis.  6. The inferior vena cava is normal in size with <50% respiratory variability, suggesting right atrial pressure of 8 mmHg. FINDINGS  Left Ventricle: Left ventricular ejection fraction, by estimation, is 60 to 65%. Left ventricular ejection fraction by 3D volume is 63 %. The left ventricle has normal function. The left ventricle has no regional wall motion abnormalities. The average left ventricular global longitudinal strain is -24.2 %. Strain was performed and the global longitudinal strain is normal. The left  ventricular internal cavity size was normal in size. There is mild concentric left ventricular hypertrophy. Left ventricular diastolic parameters are consistent with Grade I diastolic dysfunction (impaired relaxation). Right Ventricle: The right ventricular size is normal. No increase in right ventricular wall thickness. Right ventricular systolic function is normal. Left Atrium: Left atrial size was mildly dilated. Right Atrium: Right atrial size was normal in size. Pericardium: There is no evidence of pericardial effusion. Mitral Valve: The mitral valve is normal in structure. Mild mitral annular calcification. Trivial mitral valve regurgitation. No evidence of mitral valve stenosis. Tricuspid Valve: The tricuspid valve is normal in structure. Tricuspid valve regurgitation is trivial. No evidence of tricuspid stenosis. Aortic Valve: The aortic valve is tricuspid. There is mild calcification of the aortic valve. Aortic valve regurgitation is not visualized. Aortic valve sclerosis/calcification is present, without any evidence of aortic stenosis. Pulmonic Valve: The pulmonic valve was normal in structure. Pulmonic valve regurgitation is mild. No evidence of pulmonic stenosis. Aorta: The aortic root is normal in size and structure. Venous: The inferior vena cava is normal in size with less than 50% respiratory variability, suggesting right atrial pressure of 8 mmHg. IAS/Shunts: No atrial level shunt detected by color flow Doppler. Additional Comments: 3D was performed not requiring image post processing on an independent workstation and was normal.  LEFT VENTRICLE PLAX 2D LVIDd:         4.70 cm         Diastology LVIDs:         2.40 cm         LV e' medial:    5.18 cm/s LV PW:         1.20 cm         LV E/e' medial:  17.1 LV IVS:        1.10 cm         LV e' lateral:   6.23 cm/s LVOT diam:     2.00 cm         LV E/e' lateral: 14.2 LV SV:         78 LV SV Index:   42              2D LVOT Area:     3.14 cm         Longitudinal  Strain                                2D Strain GLS  -24.2 %                                Avg:                                 3D Volume EF                                LV 3D EF:    Left                                             ventricul                                             ar                                             ejection                                             fraction                                             by 3D                                             volume is                                             63 %.                                 3D Volume EF:                                3D EF:        63 %                                LV EDV:       82 ml  LV ESV:       30 ml                                LV SV:        51 ml RIGHT VENTRICLE RV Basal diam:  3.30 cm RV S prime:     10.80 cm/s TAPSE (M-mode): 1.6 cm LEFT ATRIUM             Index        RIGHT ATRIUM           Index LA diam:        5.00 cm 2.66 cm/m   RA Area:     22.50 cm LA Vol (A2C):   54.7 ml 29.05 ml/m  RA Volume:   65.90 ml  35.00 ml/m LA Vol (A4C):   53.4 ml 28.36 ml/m LA Biplane Vol: 58.9 ml 31.28 ml/m  AORTIC VALVE             PULMONIC VALVE LVOT Vmax:   82.20 cm/s  PR End Diast Vel: 7.18 msec LVOT Vmean:  50.900 cm/s LVOT VTI:    0.249 m  AORTA Ao Root diam: 3.30 cm Ao Asc diam:  3.20 cm MITRAL VALVE MV Area (PHT): 2.62 cm    SHUNTS MV Decel Time: 290 msec    Systemic VTI:  0.25 m MV E velocity: 88.60 cm/s  Systemic Diam: 2.00 cm MV A velocity: 96.40 cm/s MV E/A ratio:  0.92 Arvilla Meres MD Electronically signed by Arvilla Meres MD Signature Date/Time: 09/14/2023/12:04:16 PM    Final     Assessment/Plan 1. Acute on chronic diastolic heart failure (HCC) (Primary) Discharged on his Chlorthalidone No Issues today  2. AKI (acute kidney injury) (HCC) Creat came back to Normal after Diuresis stopped  3. PAF (paroxysmal  atrial fibrillation) (HCC) Now on Eliquis On Metoprolol  4. Essential (primary) hypertension On Number of Meds Including Hydralazine,Norvasc , Cozaar, Clonidine  5. Mixed hyperlipidemia statin  6. Renal artery stenosis (HCC) Not candidate for further stenting  7. Neuropathy Gabapentin  8. Stage 3b chronic kidney disease (HCC) Creat stable  9. Iron deficiency anemia, unspecified iron deficiency anemia type Iron  10 BPH on Proscar and Flomax 11 h/o PUD On Protonix   Labs/tests ordered:   Next appt:  Visit date not found

## 2023-11-01 ENCOUNTER — Non-Acute Institutional Stay (SKILLED_NURSING_FACILITY): Admitting: Adult Health

## 2023-11-01 ENCOUNTER — Encounter: Payer: Self-pay | Admitting: Adult Health

## 2023-11-01 DIAGNOSIS — I10 Essential (primary) hypertension: Secondary | ICD-10-CM | POA: Diagnosis not present

## 2023-11-01 DIAGNOSIS — I5032 Chronic diastolic (congestive) heart failure: Secondary | ICD-10-CM

## 2023-11-01 DIAGNOSIS — E782 Mixed hyperlipidemia: Secondary | ICD-10-CM | POA: Diagnosis not present

## 2023-11-01 DIAGNOSIS — I6522 Occlusion and stenosis of left carotid artery: Secondary | ICD-10-CM | POA: Diagnosis not present

## 2023-11-01 DIAGNOSIS — I480 Paroxysmal atrial fibrillation: Secondary | ICD-10-CM

## 2023-11-01 DIAGNOSIS — I739 Peripheral vascular disease, unspecified: Secondary | ICD-10-CM

## 2023-11-01 NOTE — Progress Notes (Signed)
 Location:  Medical illustrator of Service:  SNF (31) Provider:   Peggye Ley, ANP [facility] [phone]   Mahlon Gammon, MD  Patient Care Team: Mahlon Gammon, MD as PCP - General (Internal Medicine) Runell Gess, MD as PCP - Cardiology (Cardiology)  Extended Emergency Contact Information Primary Emergency Contact: [name] Address: [address]          [place], [place] [address] D[place] of [place] Home Phone: [phone] Mobile Phone: [phone] Relation: Spouse  Code Status:  DNR Goals of care: Advanced Directive information    10/02/2023   11:27 AM  Advanced Directives  Does Patient Have a Medical Advance Directive? Yes  Type of Estate agent of [place];Living will  Does patient want to make changes to medical advance directive? No - Patient declined  Copy of Healthcare Power of Attorney in Chart? Yes - validated most recent copy scanned in chart (See row information)     Chief Complaint  Patient presents with   Acute Visit    Right heel wound    HPI:  The patient is a 89 year old with peripheral arterial disease who presents with right heel pain and a wound on the right heel.   He has been experiencing a burning and painful sensation in his right heel, which has been a chronic issue but has recently worsened. He resides in skilled care at [facility], where the nurse applies a protective dressing to the wound, and he has not been wearing shoes. He is on Neurontin for the pain.  He has a history of peripheral arterial disease, left renal artery stenosis, and carotid stenosis. He underwent a right carotid endarterectomy, which was found to be widely patent on a carotid doppler in January 2025. There was some progression of left ICA stenosis, but it is being monitored due to his renal function. An arterial doppler in March 2023 showed monophasic dorsalis pedis and posterior tibial  signal with ABIs of 87% on the right and 65% on the left.  He was hospitalized in February 2025 for fluid overload, a urinary tract infection, acute kidney injury, acute on chronic diastolic heart failure, and atrial fibrillation with rapid ventricular response. His heart rate is now controlled with metoprolol, and he is on Eliquis 5 mg twice a day. His edema has improved, and he is using compression hose. His weight is stable at 180 pounds.  He has a history of difficult-to-control hypertension, but his blood pressure has improved to the 130s to 140s systolic over 50s to 70s diastolic, with one outlier recorded at 178/61. No shortness of breath or chest pain.  His last lipid panel in March 2024 showed an LDL of 65 and a total cholesterol of 133. He is currently on Lipitor 40 mg daily.  Past Medical History:  Diagnosis Date   AAA (abdominal aortic aneurysm) (HCC)    asymptomatic   AAA (abdominal aortic aneurysm) (HCC) 2010   peripheral  angiogram-- bilateral SFA DISEASE  and 60 to 70% infrarenaal abd. aortic stenosis with 15 -mm gradient   Adenomatous colon polyp 11/2003   CAD (coronary artery disease)    Gait abnormality 02/13/2017   Gastroparesis    pt denies   GERD (gastroesophageal reflux disease)    w/ LPR   History of kidney stones    x2   Hyperlipidemia    Hypertension    IDA (iron deficiency anemia)    Peripheral arterial disease (HCC)  RCEA  by Dr [name]   PUD (peptic ulcer disease)    pt unaware   Vertigo    Past Surgical History:  Procedure Laterality Date   ABDOMINAL AORTOGRAM W/LOWER EXTREMITY Bilateral 08/05/2018   Procedure: ABDOMINAL AORTOGRAM W/LOWER EXTREMITY;  Surgeon: Runell Gess, MD;  Location[facility] INVASIVE CV LAB;  Service: Cardiovascular;  Laterality: Bilateral;   ABDOMINAL AORTOGRAM W/LOWER EXTREMITY N/A 09/26/2021   Procedure: ABDOMINAL AORTOGRAM W/LOWER EXTREMITY;  Surgeon: Runell Gess, MD;  Location: [facility] INVASIVE CV LAB;  Service:  Cardiovascular;  Laterality: N/A;   AORTOGRAM  04/24/2016    Abdominal aortogram, bilateral iliac angiogram, bifemoral runoff   CARDIAC CATHETERIZATION  05/12/2005   RCA   carotid doppler  01/24/2013   RICA endarterectomy,left CCA 0-49%; left bulb and prox ICA 50-69%; bilaateral subclavian < 50%   CAROTID ENDARTERECTOMY  11/01/2010   CATARACT EXTRACTION     COLONOSCOPY     CORONARY ANGIOPLASTY  05/18/2005   2 STENTS distal RCA AND PROXIMAL-MID RCA   5 total stents per pt.   CYSTOSCOPY/URETEROSCOPY/HOLMIUM LASER/STENT PLACEMENT Left 01/11/2018   Procedure: LEFT URETEROSCOPY/HOLMIUM LASER/STENT PLACEMENT;  Surgeon: Crist Fat, MD;  Location: [facility] ORS;  Service: Urology;  Laterality: Left;   DOPPLER ECHOCARDIOGRAPHY  02/07/2012   EF 55%,SHOWED NO ISCHEMIA    HERNIA REPAIR     umbilical   lower arterial  doppler  02/04/2013   aotra 1.5 x 1.5 cm; distal abd aorta 70-99%,proximal common iliac arteries -very stenotic with increased velocities>50%,may be falsely elevated as a result of residual plaque from the distal aorta stenosis   lower extremity doppler  June 18 ,2013   ABI'S ABNORMAL, RABI was 0.88 and LABI 0.75 ,with 3-vessel  run off   NM MYOVIEW LTD  MAY 23,2011   showed no significant ischemia;   NM MYOVIEW LTD  04/22/2008   ef 77%,exercise capcity 6 METS ,exaggerated blood pressure response to exercise   PERIPHERAL VASCULAR CATHETERIZATION N/A 04/24/2016   Procedure: Lower Extremity Angiography;  Surgeon: Runell Gess, MD;  Location: [facility] INVASIVE CV LAB;  Service: Cardiovascular;  Laterality: N/A;   PERIPHERAL VASCULAR CATHETERIZATION  04/24/2016   Procedure: Peripheral Vascular Intervention;  Surgeon: Runell Gess, MD;  Location: [facility] INVASIVE CV LAB;  Service: Cardiovascular;;  Aorta   retrograde central aortic catheterization  05/19/2005   cutting balloon atherectomy, c-circ stenosis with DES STENTING CYPHER   TONSILLECTOMY     TOTAL HIP ARTHROPLASTY Right 07/11/2022    Procedure: TOTAL HIP ARTHROPLASTY ANTERIOR APPROACH;  Surgeon: Durene Romans, MD;  Location: [facility] ORS;  Service: Orthopedics;  Laterality: Right;   TOTAL KNEE ARTHROPLASTY Left 01/03/2019   Procedure: TOTAL KNEE ARTHROPLASTY;  Surgeon: Beverely Low, MD;  Location: [facility] ORS;  Service: Orthopedics;  Laterality: Left;  with IS block   TRANSURETHRAL RESECTION OF PROSTATE N/A 09/08/2016   Procedure: TRANSURETHRAL RESECTION OF THE PROSTATE (TURP);  Surgeon: Crist Fat, MD;  Location: [facility] OR[facility];  Service: Urology;  Laterality: N/A;   TRANSURETHRAL RESECTION OF PROSTATE     10-10-17  Dr. Marlou Porch   TRANSURETHRAL RESECTION OF PROSTATE N/A 10/10/2017   Procedure: TRANSURETHRAL RESECTION OF THE PROSTATE (TURP);  Surgeon: Crist Fat, MD;  Location: [facility] ORS;  Service: Urology;  Laterality: N/A;   VASECTOMY      Allergies  Allergen Reactions   Lisinopril Cough   Niaspan [Niacin Er (Antihyperlipidemic)] Rash    Outpatient Encounter Medications as of 11/01/2023  Medication Sig   acetaminophen (TYLENOL)  500 MG tablet Take 1 tablet (500 mg total) by mouth 2 (two) times daily as needed.   amLODipine (NORVASC) 10 MG tablet Take 10 mg by mouth daily.   apixaban (ELIQUIS) 5 MG TABS tablet Take 1 tablet (5 mg total) by mouth 2 (two) times daily.   Apoaequorin (PREVAGEN PO) Take 1 tablet by mouth in the morning.   atorvastatin (LIPITOR) 40 MG tablet Take 40 mg by mouth daily at 6 PM.    Biotin 10 MG TABS Take 10 mg by mouth in the morning.   cetirizine (ZYRTEC) 10 MG tablet Take 1 tablet (10 mg total) by mouth at bedtime.   chlorthalidone (HYGROTON) 50 MG tablet Take 50 mg by mouth in the morning. Hold if BP < 95   cholecalciferol (VITAMIN D3) 25 MCG (1000 UT) tablet Take 1,000 Units by mouth in the morning.   cloNIDine (CATAPRES) 0.1 MG tablet Take 1 tablet (0.1 mg total) by mouth 3 (three) times daily. (Patient taking differently: Take 0.1 mg by mouth 3 (three) times daily. Hold if BP < 95)   finasteride  (PROSCAR) 5 MG tablet Take 5 mg by mouth in the morning.   gabapentin (NEURONTIN) 100 MG capsule Take 100 mg by mouth 3 (three) times daily.   Glucosamine-Chondroitin 750-600 MG TABS Take 1 tablet by mouth 2 (two) times daily.   hydrALAZINE (APRESOLINE) 25 MG tablet Take 25 mg by mouth in the morning and at bedtime. Hold if BP < 95   hydrALAZINE (APRESOLINE) 50 MG tablet Take 50 mg by mouth every evening. Hold if BP <95   iron polysaccharides (NIFEREX) 150 MG capsule Take 150 mg by mouth every Monday, Wednesday, and Friday.   losartan (COZAAR) 50 MG tablet Take 50 mg by mouth 2 (two) times daily.   melatonin 5 MG TABS Take 1 tablet (5 mg total) by mouth at bedtime.   metoprolol tartrate (LOPRESSOR) 50 MG tablet Take 75 mg by mouth 2 (two) times daily.   Multiple Vitamin (MULTIVITAMIN WITH MINERALS) TABS tablet Take 1 tablet by mouth in the morning.   pantoprazole (PROTONIX) 40 MG tablet Take 40 mg by mouth 2 (two) times daily.   potassium chloride SA (K-DUR,KLOR-CON) 20 MEQ tablet Take 20 mEq by mouth every evening.    tamsulosin (FLOMAX) 0.4 MG CAPS capsule Take 1 capsule (0.4 mg total) by mouth daily after supper.   No facility-administered encounter medications on file as of 11/01/2023.    Review of Systems  Constitutional:  Negative for activity change, appetite change, chills, diaphoresis, fatigue, fever and unexpected weight change.  Respiratory:  Positive for cough (chronic dry). Negative for shortness of breath, wheezing and stridor.   Cardiovascular:  Positive for leg swelling. Negative for chest pain and palpitations.  Gastrointestinal:  Negative for abdominal distention, abdominal pain, constipation and diarrhea.  Genitourinary:  Negative for difficulty urinating and dysuria.  Musculoskeletal:  Positive for gait problem. Negative for arthralgias, back pain, joint swelling and myalgias.       Heel pain  Skin:  Positive for wound.  Neurological:  Negative for dizziness, seizures,  syncope, facial asymmetry, speech difficulty, weakness and headaches.  Hematological:  Negative for adenopathy. Does not bruise/bleed easily.  Psychiatric/Behavioral:  Negative for agitation, behavioral problems and confusion.     Immunization History  Administered Date(s) Administered   Fluad Quad(high Dose 65+) 05/02/2022, 05/22/2023   Influenza, High Dose Seasonal PF 03/31/2017, 03/29/2018   Influenza,inj,Quad PF,6+ Mos 04/25/2016, 05/06/2018   Influenza,inj,quad, With Preservative 05/26/2019  Influenza-Unspecified 05/14/2001, 05/31/2001, 05/14/2002, 05/15/2002, 05/18/2003, 05/25/2004, 04/30/2005, 05/29/2006, 05/01/2007, 05/11/2008, 05/31/2009, 05/31/2010, 04/30/2013, 05/31/2014, 03/31/2021   Moderna Covid-19 Vaccine Bivalent Booster 79yrs & up 08/31/2021, 05/22/2023   Moderna SARS-COV2 Booster Vaccination 06/15/2020, 11/19/2020, 01/02/2022   Moderna Sars-Covid-2 Vaccination 08/12/2019, 09/10/2019, 05/31/2022   Pfizer Covid-19 Vaccine Bivalent Booster 22yrs & up 11/23/2022   Pneumococcal Polysaccharide-23 05/02/2000, 09/24/2018   Rsv, Bivalent, Protein Subunit Rsvpref,pf Verdis Frederickson) 07/19/2022   Td 09/08/2013   Zoster Recombinant(Shingrix) 10/04/2017, 01/23/2018   Pertinent  Health Maintenance Due  Topic Date Due   INFLUENZA VACCINE  02/29/2024      07/13/2022    7:10 AM 07/28/2022    9:00 AM 10/14/2022    7:21 AM 11/28/2022    2:50 PM 06/04/2023    4:59 PM  Fall Risk  Falls in the past year?   1 1 0  Was there an injury with Fall?   1 0 0  Fall Risk Category Calculator   3 1 0  (RETIRED) Patient Fall Risk Level High fall risk High fall risk     Patient at Risk for Falls Due to    History of fall(s);Impaired balance/gait;Impaired mobility No Fall Risks  Fall risk Follow up   Falls evaluation completed Falls evaluation completed;Education provided;Falls prevention discussed Falls evaluation completed   Functional Status Survey:    Vitals:   11/01/23 1558  BP: (!) 148/54   Pulse: 64  Resp: 14  Temp: (!) 97.4 F (36.3 C)  Weight: 180 lb (81.6 kg)   Body mass index is 30.9 kg/m. Physical Exam Vitals and nursing note reviewed.  Constitutional:      Appearance: Normal appearance.  Eyes:     Conjunctiva/sclera: Conjunctivae normal.     Pupils: Pupils are equal, round, and reactive to light.  Cardiovascular:     Rate and Rhythm: Normal rate and regular rhythm.     Pulses:          Dorsalis pedis pulses are 0 on the right side and 0 on the left side.     Heart sounds: No murmur heard. Pulmonary:     Effort: Pulmonary effort is normal. No respiratory distress.     Breath sounds: Normal breath sounds. No wheezing.  Abdominal:     General: Bowel sounds are normal. There is no distension.     Palpations: Abdomen is soft.     Tenderness: There is no abdominal tenderness.  Musculoskeletal:     Cervical back: No rigidity.     Comments: +1 edema to BLE   Lymphadenopathy:     Cervical: No cervical adenopathy.  Skin:    General: Skin is warm and dry.     Comments: Right heel with circular brown eschar. Boggy texture. Not stagable.   Neurological:     General: No focal deficit present.     Mental Status: He is alert. Mental status is at baseline.     Labs reviewed: Recent Labs    09/28/23 0236 09/29/23 0752 09/30/23 0231 10/01/23 0234  NA 142 144 142 140  K 3.7 3.7 3.4* 3.6  CL 106 108 107 106  CO2 26 25 24 24   GLUCOSE 120* 127* 111* 102*  BUN 49* 40* 37* 32*  CREATININE 1.99* 1.39* 1.37* 1.16  CALCIUM 8.7* 9.2 8.8* 8.8*  MG 2.1  --   --  2.2   Recent Labs    06/18/23 0000  AST 16  ALT 15  ALKPHOS 91  ALBUMIN 3.9   Recent Labs  09/25/23 2212 09/27/23 0228 09/29/23 0752 09/30/23 0231  WBC 7.4 9.3 9.4 7.1  NEUTROABS 5.6  --   --   --   HGB 11.7* 10.3* 11.1* 10.0*  HCT 36.7* 31.0* 33.3* 30.9*  MCV 91.5 88.3 88.6 90.4  PLT 181 187 185 184   Lab Results  Component Value Date   TSH 6.02 (A) 06/18/2023   Lab Results   Component Value Date   HGBA1C 5.5 11/23/2016   Lab Results  Component Value Date   CHOL 133 10/17/2022   HDL 49 10/17/2022   LDLCALC 65 10/17/2022   TRIG 122 10/17/2022   CHOLHDL 2.4 11/23/2016    Significant Diagnostic Results in last 30 days:  No results found.  Assessment/Plan Right heel pain and wound Chronic right heel pain with burning sensation, worsening. On Neurontin for pain management. ?etiology pressure vs arterial -increase neurontin to 200 mg tid - Continue protective dressing on right heel. - Advise to avoid wearing shoes that may aggravate the wound.  Peripheral Arterial Disease (PAD) Peripheral arterial disease with monophasic dorsalis pedis and posterior tibial signal on arterial doppler. ABIs: 87% right, 65% left. On Eliquis for atrial fibrillation. Referred to vascular surgery for further evaluation. Discussed with his wife, Nelva Bush, who agrees with the referral. - Refer to Dr. Cari Caraway for further evaluation and recommendations.  Chronic Diastolic Heart Failure Edema improved with compression hose and diuresis.[name] - on chlorthalidone   Atrial Fibrillation Atrial fibrillation with rapid ventricular response, controlled on metoprolol. On Eliquis 5 mg twice daily for anticoagulation. - Continue metoprolol for rate control. - Continue Eliquis 5 mg twice daily for anticoagulation.  Hypertension Difficult to control hypertension, improved with blood pressure readings in the 130s to 140s systolic and 50s to 70s diastolic.  No symptoms of shortness of breath or chest pain. - Maintain current blood pressure management regimen.  Carotid Artery Stenosis  - Continue monitoring carotid artery stenosis with Dr. Allyson Sabal.  Hyperlipidemia Last lipid panel: LDL 65, total cholesterol 161. On Lipitor 40 mg daily. - Continue Lipitor 40 mg daily.   Family/ staff Communication: discussed with Nelva Bush  Labs/tests ordered:  NA

## 2023-11-05 ENCOUNTER — Other Ambulatory Visit: Payer: Self-pay

## 2023-11-07 ENCOUNTER — Encounter: Payer: Self-pay | Admitting: Vascular Surgery

## 2023-11-07 ENCOUNTER — Ambulatory Visit (HOSPITAL_COMMUNITY)
Admission: RE | Admit: 2023-11-07 | Discharge: 2023-11-07 | Disposition: A | Discharge status: Home / Self Care | Source: Ambulatory Visit | Attending: Vascular Surgery | Admitting: Vascular Surgery

## 2023-11-07 ENCOUNTER — Ambulatory Visit: Admitting: Vascular Surgery

## 2023-11-07 ENCOUNTER — Other Ambulatory Visit: Payer: Self-pay

## 2023-11-07 VITALS — BP 136/68 | HR 53 | Temp 97.8°F

## 2023-11-07 DIAGNOSIS — I739 Peripheral vascular disease, unspecified: Secondary | ICD-10-CM | POA: Diagnosis not present

## 2023-11-07 DIAGNOSIS — I7025 Atherosclerosis of native arteries of other extremities with ulceration: Secondary | ICD-10-CM | POA: Diagnosis not present

## 2023-11-07 LAB — VAS US ABI WITH/WO TBI: Left ABI: 0.37

## 2023-11-07 NOTE — Progress Notes (Signed)
 Patient ID: Nathan Santiago, male   DOB: 11-03-1930, 89 y.o.   MRN: [id number]  Reason for Consult: Follow-up   Referred by Mahlon Gammon, MD  Subjective:     HPI:  Nathan Santiago is a 89 y.o. male with history of aortic stenting.  He is now here for evaluation of right heel pain.  Patient lives in the nursing facility side of [facility].  He is ambulatory but usually is in a motorized wheelchair.  He uses his legs for transferring.  He is followed by Dr. Gery Pray for carotid stenosis and he is on Eliquis although he is unsure of the reasoning.  He does have strong personal history of coronary artery disease as well as hyperlipidemia and hypertension.  He was followed here in the past by Dr. Durwin Nora.  Past Medical History:  Diagnosis Date   AAA (abdominal aortic aneurysm) (HCC)    asymptomatic   AAA (abdominal aortic aneurysm) (HCC) 2010   peripheral  angiogram-- bilateral SFA DISEASE  and 60 to 70% infrarenaal abd. aortic stenosis with 15 -mm gradient   Adenomatous colon polyp 11/2003   CAD (coronary artery disease)    Gait abnormality 02/13/2017   Gastroparesis    pt denies   GERD (gastroesophageal reflux disease)    w/ LPR   History of kidney stones    x2   Hyperlipidemia    Hypertension    IDA (iron deficiency anemia)    Peripheral arterial disease (HCC)    RCEA  by Dr [name]   PUD (peptic ulcer disease)    pt unaware   Vertigo    Family History  Problem Relation Age of Onset   Ovarian cancer Sister    Heart disease Father    Brain cancer Brother        Tumor   Past Surgical History:  Procedure Laterality Date   ABDOMINAL AORTOGRAM W/LOWER EXTREMITY Bilateral 08/05/2018   Procedure: ABDOMINAL AORTOGRAM W/LOWER EXTREMITY;  Surgeon: Runell Gess, MD;  Location: [facility];  Service: Cardiovascular;  Laterality: Bilateral;   ABDOMINAL AORTOGRAM W/LOWER EXTREMITY N/A 09/26/2021   Procedure: ABDOMINAL AORTOGRAM W/LOWER EXTREMITY;  Surgeon: Runell Gess, MD;  Location: [facility];  Service: Cardiovascular;  Laterality: N/A;   AORTOGRAM  04/24/2016    Abdominal aortogram, bilateral iliac angiogram, bifemoral runoff   CARDIAC CATHETERIZATION  05/12/2005   RCA   carotid doppler  01/24/2013   RICA endarterectomy,left CCA 0-49%; left bulb and prox ICA 50-69%; bilaateral subclavian < 50%   CAROTID ENDARTERECTOMY  11/01/2010   CATARACT EXTRACTION     COLONOSCOPY     CORONARY ANGIOPLASTY  05/18/2005   2 STENTS distal RCA AND PROXIMAL-MID RCA   5 total stents per pt.   CYSTOSCOPY/URETEROSCOPY/HOLMIUM LASER/STENT PLACEMENT Left 01/11/2018   Procedure: LEFT URETEROSCOPY/HOLMIUM LASER/STENT PLACEMENT;  Surgeon: Crist Fat, MD;  Location: [facility];  Service: Urology;  Laterality: Left;   DOPPLER ECHOCARDIOGRAPHY  02/07/2012   EF 55%,SHOWED NO ISCHEMIA    HERNIA REPAIR     umbilical   lower arterial  doppler  02/04/2013   aotra 1.5 x 1.5 cm; distal abd aorta 70-99%,proximal common iliac arteries -very stenotic with increased velocities>50%,may be falsely elevated as a result of residual plaque from the distal aorta stenosis   lower extremity doppler  June 18 ,2013   ABI'S ABNORMAL, RABI was 0.88 and LABI 0.75 ,with 3-vessel  run off   NM MYOVIEW LTD  MAY [phone]  showed no significant ischemia;   NM MYOVIEW LTD  04/22/2008   ef 77%,exercise capcity 6 METS ,exaggerated blood pressure response to exercise   PERIPHERAL VASCULAR CATHETERIZATION N/A 04/24/2016   Procedure: Lower Extremity Angiography;  Surgeon: Runell Gess, MD;  Location: [facility];  Service: Cardiovascular;  Laterality: N/A;   PERIPHERAL VASCULAR CATHETERIZATION  04/24/2016   Procedure: Peripheral Vascular Intervention;  Surgeon: Runell Gess, MD;  Location: [facility] INVASIVE CV [facility];  Service: Cardiovascular;;  Aorta   retrograde central aortic catheterization  05/19/2005   cutting balloon atherectomy, c-circ stenosis with DES STENTING CYPHER    TONSILLECTOMY     TOTAL HIP ARTHROPLASTY Right 07/11/2022   Procedure: TOTAL HIP ARTHROPLASTY ANTERIOR APPROACH;  Surgeon: Durene Romans, MD;  Location: [facility];  Service: Orthopedics;  Laterality: Right;   TOTAL KNEE ARTHROPLASTY Left 01/03/2019   Procedure: TOTAL KNEE ARTHROPLASTY;  Surgeon: Beverely Low, MD;  Location: [facility];  Service: Orthopedics;  Laterality: Left;  with IS block   TRANSURETHRAL RESECTION OF PROSTATE N/A 09/08/2016   Procedure: TRANSURETHRAL RESECTION OF THE PROSTATE (TURP);  Surgeon: Crist Fat, MD;  Location: [facility];  Service: Urology;  Laterality: N/A;   TRANSURETHRAL RESECTION OF PROSTATE     10-10-17  Dr. Marlou Porch   TRANSURETHRAL RESECTION OF PROSTATE N/A 10/10/2017   Procedure: TRANSURETHRAL RESECTION OF THE PROSTATE (TURP);  Surgeon: Crist Fat, MD;  Location: [facility];  Service: Urology;  Laterality: N/A;   VASECTOMY      Short Social History:  Social History   Tobacco Use   Smoking status: Former    Current packs/day: 0.00    Average packs/day: 2.0 packs/day for 20.0 years (40.0 ttl pk-yrs)    Types: Cigarettes    Start date: 07/31/1948    Quit date: 07/31/1968    Years since quitting: 55.3   Smokeless tobacco: Never  Substance Use Topics   Alcohol use: Not Currently    Alcohol/week: 4.0 standard drinks of alcohol    Types: 4 Glasses of wine per week    Allergies  Allergen Reactions   Lisinopril Cough   Niaspan [Niacin Er (Antihyperlipidemic)] Rash    Current Outpatient Medications  Medication Sig Dispense Refill   acetaminophen (TYLENOL) 500 MG tablet Take 1 tablet (500 mg total) by mouth 2 (two) times daily as needed. 30 tablet 2   amLODipine (NORVASC) 10 MG tablet Take 10 mg by mouth daily.     apixaban (ELIQUIS) 5 MG TABS tablet Take 1 tablet (5 mg total) by mouth 2 (two) times daily.     Apoaequorin (PREVAGEN PO) Take 1 tablet by mouth in the morning.     atorvastatin (LIPITOR) 40 MG tablet Take 40 mg by mouth daily at 6 PM.       Biotin 10 MG TABS Take 10 mg by mouth in the morning.     cetirizine (ZYRTEC) 10 MG tablet Take 1 tablet (10 mg total) by mouth at bedtime. 30 tablet 11   chlorthalidone (HYGROTON) 50 MG tablet Take 50 mg by mouth in the morning. Hold if BP < 95     cholecalciferol (VITAMIN D3) 25 MCG (1000 UT) tablet Take 1,000 Units by mouth in the morning.     cloNIDine (CATAPRES) 0.1 MG tablet Take 1 tablet (0.1 mg total) by mouth 3 (three) times daily. (Patient taking differently: Take 0.1 mg by mouth 3 (three) times daily. Hold if BP < 95)     finasteride (PROSCAR) 5 MG tablet  Take 5 mg by mouth in the morning.     gabapentin (NEURONTIN) 100 MG capsule Take 100 mg by mouth 3 (three) times daily.     Glucosamine-Chondroitin 750-600 MG TABS Take 1 tablet by mouth 2 (two) times daily.     hydrALAZINE (APRESOLINE) 25 MG tablet Take 25 mg by mouth in the morning and at bedtime. Hold if BP < 95     hydrALAZINE (APRESOLINE) 50 MG tablet Take 50 mg by mouth every evening. Hold if BP <95     iron polysaccharides (NIFEREX) 150 MG capsule Take 150 mg by mouth every Monday, Wednesday, and Friday.     losartan (COZAAR) 50 MG tablet Take 50 mg by mouth 2 (two) times daily.     melatonin 5 MG TABS Take 1 tablet (5 mg total) by mouth at bedtime. 30 tablet 0   metoprolol tartrate (LOPRESSOR) 50 MG tablet Take 75 mg by mouth 2 (two) times daily.     Multiple Vitamin (MULTIVITAMIN WITH MINERALS) TABS tablet Take 1 tablet by mouth in the morning.     pantoprazole (PROTONIX) 40 MG tablet Take 40 mg by mouth 2 (two) times daily.     potassium chloride SA (K-DUR,KLOR-CON) 20 MEQ tablet Take 20 mEq by mouth every evening.      tamsulosin (FLOMAX) 0.4 MG CAPS capsule Take 1 capsule (0.4 mg total) by mouth daily after supper. 30 capsule    No current facility-administered medications for this visit.    Review of Systems  Constitutional:  Constitutional negative. HENT: HENT negative.  Eyes: Eyes negative.  Cardiovascular:  Cardiovascular negative.  GI: Gastrointestinal negative.  Musculoskeletal: Positive for leg pain.  Skin: Positive for wound.  Neurological: Neurological negative. Hematologic: Hematologic/lymphatic negative.  Psychiatric: Psychiatric negative.        Objective:  Objective   Vitals:   11/07/23 1050  BP: 136/68  Pulse: (!) 53  Temp: 97.8 F (36.6 C)  SpO2: 93%   There is no height or weight on file to calculate BMI.  Physical Exam HENT:     Head: Normocephalic.  Cardiovascular:     Pulses:          Femoral pulses are 0 on the right side and 0 on the left side.      Popliteal pulses are 0 on the right side and 0 on the left side.       Dorsalis pedis pulses are 0 on the right side and 0 on the left side.       Posterior tibial pulses are 0 on the right side and 0 on the left side.  Abdominal:     General: Abdomen is flat.     Palpations: Abdomen is soft. There is no mass.  Musculoskeletal:     Comments: There is superficial ulceration of the right heel  Skin:    Capillary Refill: Capillary refill takes 2 to 3 seconds.  Neurological:     General: No focal deficit present.     Mental Status: He is alert.  Psychiatric:        Mood and Affect: Mood normal.     Data: ABI Findings:  +---------+------------------+-----+----------+  Right   Rt Pressure (mmHg)IndexWaveform    +---------+------------------+-----+----------+  Brachial 151                                +---------+------------------+-----+----------+  PTA     255  1.67 monophasic  +---------+------------------+-----+----------+  DP      49                0.32 biphasic    +---------+------------------+-----+----------+  Great Toe19                0.12 Abnormal    +---------+------------------+-----+----------+   +---------+------------------+-----+----------+  Left    Lt Pressure (mmHg)IndexWaveform    +---------+------------------+-----+----------+  Brachial  153                                +---------+------------------+-----+----------+  PTA     46                0.30 monophasic  +---------+------------------+-----+----------+  DP      57                0.37 monophasic  +---------+------------------+-----+----------+  Great Toe112               0.73 Normal      +---------+------------------+-----+----------+   +-------+-----------+-----------+------------+------------+  ABI/TBIToday's ABIToday's TBIPrevious ABIPrevious TBI  +-------+-----------+-----------+------------+------------+  Right [place]         0.12       0.87        0.50          +-------+-----------+-----------+------------+------------+  Left  0.37       0.73       0.65        0.50          +-------+-----------+-----------+------------+------------+     Summary:  Right: Resting right ankle-brachial index indicates noncompressible right  lower extremity arteries. The right toe-brachial index is abnormal.   Left: Resting left ankle-brachial index indicates severe left lower  extremity arterial disease. The left toe-brachial index is normal.       Assessment/Plan:     89 year old male with superficial ulceration of the right heel with atherosclerosis of his native arteries leading to ulceration.  He does have a history of an aortic stent as well as known bilateral common femoral disease and known right superficial femoral artery and popliteal artery stenoses.  We have discussed proceeding with angiography from the left common femoral approach to evaluate the aortic stent as well as right lower extremity.  We discussed that this may not be straightforward case to treat just superficial femoral artery stenosis as he may have progression of his common femoral artery stenoses bilaterally.  We discussed that entering the left common femoral artery is certainly risky and that he needs to protect his heal by floating his heels and I have also  recommended no compression stockings on the right lower extremity.  All of his questions were answered in the presence of his wife and they demonstrate good understanding.  Shammond Arave [facility] has atherosclerosis of the native arteries of the Right lower extremities causing ulceration. The patient is on best medical therapy for peripheral arterial disease. The patient has been counseled about the risks of tobacco use in atherosclerotic disease. The patient has been counseled to abstain from any tobacco use. An aortogram with bilateral lower extremity runoff angiography and Right lower extremity intervention and is indicated to better evaluate the patient's lower extremity circulation because of the limb threatening nature of the patient's diagnosis. Based on the patient's clinical exam and non-invasive data, we anticipate an endovascular intervention in the femoropopliteal vessels. Stenting and/or athrectomy would be favored because of the improved primary patency of  these interventions as compared to plain balloon angioplasty.      Maeola Harman MD [facility]

## 2023-11-07 NOTE — H&P (View-Only) (Signed)
 Patient ID: Nathan Santiago, male   DOB: 11-03-1930, 89 y.o.   MRN: [id number]  Reason for Consult: Follow-up   Referred by Mahlon Gammon, MD  Subjective:     HPI:  Nathan Santiago is a 89 y.o. male with history of aortic stenting.  He is now here for evaluation of right heel pain.  Patient lives in the nursing facility side of [facility].  He is ambulatory but usually is in a motorized wheelchair.  He uses his legs for transferring.  He is followed by Dr. Gery Pray for carotid stenosis and he is on Eliquis although he is unsure of the reasoning.  He does have strong personal history of coronary artery disease as well as hyperlipidemia and hypertension.  He was followed here in the past by Dr. Durwin Nora.  Past Medical History:  Diagnosis Date   AAA (abdominal aortic aneurysm) (HCC)    asymptomatic   AAA (abdominal aortic aneurysm) (HCC) 2010   peripheral  angiogram-- bilateral SFA DISEASE  and 60 to 70% infrarenaal abd. aortic stenosis with 15 -mm gradient   Adenomatous colon polyp 11/2003   CAD (coronary artery disease)    Gait abnormality 02/13/2017   Gastroparesis    pt denies   GERD (gastroesophageal reflux disease)    w/ LPR   History of kidney stones    x2   Hyperlipidemia    Hypertension    IDA (iron deficiency anemia)    Peripheral arterial disease (HCC)    RCEA  by Dr [name]   PUD (peptic ulcer disease)    pt unaware   Vertigo    Family History  Problem Relation Age of Onset   Ovarian cancer Sister    Heart disease Father    Brain cancer Brother        Tumor   Past Surgical History:  Procedure Laterality Date   ABDOMINAL AORTOGRAM W/LOWER EXTREMITY Bilateral 08/05/2018   Procedure: ABDOMINAL AORTOGRAM W/LOWER EXTREMITY;  Surgeon: Runell Gess, MD;  Location: [facility];  Service: Cardiovascular;  Laterality: Bilateral;   ABDOMINAL AORTOGRAM W/LOWER EXTREMITY N/A 09/26/2021   Procedure: ABDOMINAL AORTOGRAM W/LOWER EXTREMITY;  Surgeon: Runell Gess, MD;  Location: [facility];  Service: Cardiovascular;  Laterality: N/A;   AORTOGRAM  04/24/2016    Abdominal aortogram, bilateral iliac angiogram, bifemoral runoff   CARDIAC CATHETERIZATION  05/12/2005   RCA   carotid doppler  01/24/2013   RICA endarterectomy,left CCA 0-49%; left bulb and prox ICA 50-69%; bilaateral subclavian < 50%   CAROTID ENDARTERECTOMY  11/01/2010   CATARACT EXTRACTION     COLONOSCOPY     CORONARY ANGIOPLASTY  05/18/2005   2 STENTS distal RCA AND PROXIMAL-MID RCA   5 total stents per pt.   CYSTOSCOPY/URETEROSCOPY/HOLMIUM LASER/STENT PLACEMENT Left 01/11/2018   Procedure: LEFT URETEROSCOPY/HOLMIUM LASER/STENT PLACEMENT;  Surgeon: Crist Fat, MD;  Location: [facility];  Service: Urology;  Laterality: Left;   DOPPLER ECHOCARDIOGRAPHY  02/07/2012   EF 55%,SHOWED NO ISCHEMIA    HERNIA REPAIR     umbilical   lower arterial  doppler  02/04/2013   aotra 1.5 x 1.5 cm; distal abd aorta 70-99%,proximal common iliac arteries -very stenotic with increased velocities>50%,may be falsely elevated as a result of residual plaque from the distal aorta stenosis   lower extremity doppler  June 18 ,2013   ABI'S ABNORMAL, RABI was 0.88 and LABI 0.75 ,with 3-vessel  run off   NM MYOVIEW LTD  MAY [phone]  showed no significant ischemia;   NM MYOVIEW LTD  04/22/2008   ef 77%,exercise capcity 6 METS ,exaggerated blood pressure response to exercise   PERIPHERAL VASCULAR CATHETERIZATION N/A 04/24/2016   Procedure: Lower Extremity Angiography;  Surgeon: Runell Gess, MD;  Location: [facility];  Service: Cardiovascular;  Laterality: N/A;   PERIPHERAL VASCULAR CATHETERIZATION  04/24/2016   Procedure: Peripheral Vascular Intervention;  Surgeon: Runell Gess, MD;  Location: [facility] INVASIVE CV LAB;  Service: Cardiovascular;;  Aorta   retrograde central aortic catheterization  05/19/2005   cutting balloon atherectomy, c-circ stenosis with DES STENTING CYPHER    TONSILLECTOMY     TOTAL HIP ARTHROPLASTY Right 07/11/2022   Procedure: TOTAL HIP ARTHROPLASTY ANTERIOR APPROACH;  Surgeon: Durene Romans, MD;  Location: [facility];  Service: Orthopedics;  Laterality: Right;   TOTAL KNEE ARTHROPLASTY Left 01/03/2019   Procedure: TOTAL KNEE ARTHROPLASTY;  Surgeon: Beverely Low, MD;  Location: [facility];  Service: Orthopedics;  Laterality: Left;  with IS block   TRANSURETHRAL RESECTION OF PROSTATE N/A 09/08/2016   Procedure: TRANSURETHRAL RESECTION OF THE PROSTATE (TURP);  Surgeon: Crist Fat, MD;  Location: [facility];  Service: Urology;  Laterality: N/A;   TRANSURETHRAL RESECTION OF PROSTATE     10-10-17  Dr. Marlou Porch   TRANSURETHRAL RESECTION OF PROSTATE N/A 10/10/2017   Procedure: TRANSURETHRAL RESECTION OF THE PROSTATE (TURP);  Surgeon: Crist Fat, MD;  Location: [facility];  Service: Urology;  Laterality: N/A;   VASECTOMY      Short Social History:  Social History   Tobacco Use   Smoking status: Former    Current packs/day: 0.00    Average packs/day: 2.0 packs/day for 20.0 years (40.0 ttl pk-yrs)    Types: Cigarettes    Start date: 07/31/1948    Quit date: 07/31/1968    Years since quitting: 55.3   Smokeless tobacco: Never  Substance Use Topics   Alcohol use: Not Currently    Alcohol/week: 4.0 standard drinks of alcohol    Types: 4 Glasses of wine per week    Allergies  Allergen Reactions   Lisinopril Cough   Niaspan [Niacin Er (Antihyperlipidemic)] Rash    Current Outpatient Medications  Medication Sig Dispense Refill   acetaminophen (TYLENOL) 500 MG tablet Take 1 tablet (500 mg total) by mouth 2 (two) times daily as needed. 30 tablet 2   amLODipine (NORVASC) 10 MG tablet Take 10 mg by mouth daily.     apixaban (ELIQUIS) 5 MG TABS tablet Take 1 tablet (5 mg total) by mouth 2 (two) times daily.     Apoaequorin (PREVAGEN PO) Take 1 tablet by mouth in the morning.     atorvastatin (LIPITOR) 40 MG tablet Take 40 mg by mouth daily at 6 PM.       Biotin 10 MG TABS Take 10 mg by mouth in the morning.     cetirizine (ZYRTEC) 10 MG tablet Take 1 tablet (10 mg total) by mouth at bedtime. 30 tablet 11   chlorthalidone (HYGROTON) 50 MG tablet Take 50 mg by mouth in the morning. Hold if BP < 95     cholecalciferol (VITAMIN D3) 25 MCG (1000 UT) tablet Take 1,000 Units by mouth in the morning.     cloNIDine (CATAPRES) 0.1 MG tablet Take 1 tablet (0.1 mg total) by mouth 3 (three) times daily. (Patient taking differently: Take 0.1 mg by mouth 3 (three) times daily. Hold if BP < 95)     finasteride (PROSCAR) 5 MG tablet  Take 5 mg by mouth in the morning.     gabapentin (NEURONTIN) 100 MG capsule Take 100 mg by mouth 3 (three) times daily.     Glucosamine-Chondroitin 750-600 MG TABS Take 1 tablet by mouth 2 (two) times daily.     hydrALAZINE (APRESOLINE) 25 MG tablet Take 25 mg by mouth in the morning and at bedtime. Hold if BP < 95     hydrALAZINE (APRESOLINE) 50 MG tablet Take 50 mg by mouth every evening. Hold if BP <95     iron polysaccharides (NIFEREX) 150 MG capsule Take 150 mg by mouth every Monday, Wednesday, and Friday.     losartan (COZAAR) 50 MG tablet Take 50 mg by mouth 2 (two) times daily.     melatonin 5 MG TABS Take 1 tablet (5 mg total) by mouth at bedtime. 30 tablet 0   metoprolol tartrate (LOPRESSOR) 50 MG tablet Take 75 mg by mouth 2 (two) times daily.     Multiple Vitamin (MULTIVITAMIN WITH MINERALS) TABS tablet Take 1 tablet by mouth in the morning.     pantoprazole (PROTONIX) 40 MG tablet Take 40 mg by mouth 2 (two) times daily.     potassium chloride SA (K-DUR,KLOR-CON) 20 MEQ tablet Take 20 mEq by mouth every evening.      tamsulosin (FLOMAX) 0.4 MG CAPS capsule Take 1 capsule (0.4 mg total) by mouth daily after supper. 30 capsule    No current facility-administered medications for this visit.    Review of Systems  Constitutional:  Constitutional negative. HENT: HENT negative.  Eyes: Eyes negative.  Cardiovascular:  Cardiovascular negative.  GI: Gastrointestinal negative.  Musculoskeletal: Positive for leg pain.  Skin: Positive for wound.  Neurological: Neurological negative. Hematologic: Hematologic/lymphatic negative.  Psychiatric: Psychiatric negative.        Objective:  Objective   Vitals:   11/07/23 1050  BP: 136/68  Pulse: (!) 53  Temp: 97.8 F (36.6 C)  SpO2: 93%   There is no height or weight on file to calculate BMI.  Physical Exam HENT:     Head: Normocephalic.  Cardiovascular:     Pulses:          Femoral pulses are 0 on the right side and 0 on the left side.      Popliteal pulses are 0 on the right side and 0 on the left side.       Dorsalis pedis pulses are 0 on the right side and 0 on the left side.       Posterior tibial pulses are 0 on the right side and 0 on the left side.  Abdominal:     General: Abdomen is flat.     Palpations: Abdomen is soft. There is no mass.  Musculoskeletal:     Comments: There is superficial ulceration of the right heel  Skin:    Capillary Refill: Capillary refill takes 2 to 3 seconds.  Neurological:     General: No focal deficit present.     Mental Status: He is alert.  Psychiatric:        Mood and Affect: Mood normal.     Data: ABI Findings:  +---------+------------------+-----+----------+  Right   Rt Pressure (mmHg)IndexWaveform    +---------+------------------+-----+----------+  Brachial 151                                +---------+------------------+-----+----------+  PTA     255  1.67 monophasic  +---------+------------------+-----+----------+  DP      49                0.32 biphasic    +---------+------------------+-----+----------+  Great Toe19                0.12 Abnormal    +---------+------------------+-----+----------+   +---------+------------------+-----+----------+  Left    Lt Pressure (mmHg)IndexWaveform    +---------+------------------+-----+----------+  Brachial  153                                +---------+------------------+-----+----------+  PTA     46                0.30 monophasic  +---------+------------------+-----+----------+  DP      57                0.37 monophasic  +---------+------------------+-----+----------+  Great Toe112               0.73 Normal      +---------+------------------+-----+----------+   +-------+-----------+-----------+------------+------------+  ABI/TBIToday's ABIToday's TBIPrevious ABIPrevious TBI  +-------+-----------+-----------+------------+------------+  Right [place]         0.12       0.87        0.50          +-------+-----------+-----------+------------+------------+  Left  0.37       0.73       0.65        0.50          +-------+-----------+-----------+------------+------------+     Summary:  Right: Resting right ankle-brachial index indicates noncompressible right  lower extremity arteries. The right toe-brachial index is abnormal.   Left: Resting left ankle-brachial index indicates severe left lower  extremity arterial disease. The left toe-brachial index is normal.       Assessment/Plan:     89 year old male with superficial ulceration of the right heel with atherosclerosis of his native arteries leading to ulceration.  He does have a history of an aortic stent as well as known bilateral common femoral disease and known right superficial femoral artery and popliteal artery stenoses.  We have discussed proceeding with angiography from the left common femoral approach to evaluate the aortic stent as well as right lower extremity.  We discussed that this may not be straightforward case to treat just superficial femoral artery stenosis as he may have progression of his common femoral artery stenoses bilaterally.  We discussed that entering the left common femoral artery is certainly risky and that he needs to protect his heal by floating his heels and I have also  recommended no compression stockings on the right lower extremity.  All of his questions were answered in the presence of his wife and they demonstrate good understanding.  Nathan Santiago has atherosclerosis of the native arteries of the Right lower extremities causing ulceration. The patient is on best medical therapy for peripheral arterial disease. The patient has been counseled about the risks of tobacco use in atherosclerotic disease. The patient has been counseled to abstain from any tobacco use. An aortogram with bilateral lower extremity runoff angiography and Right lower extremity intervention and is indicated to better evaluate the patient's lower extremity circulation because of the limb threatening nature of the patient's diagnosis. Based on the patient's clinical exam and non-invasive data, we anticipate an endovascular intervention in the femoropopliteal vessels. Stenting and/or athrectomy would be favored because of the improved primary patency of  these interventions as compared to plain balloon angioplasty.      Nathan Harman MD [facility]

## 2023-11-08 ENCOUNTER — Other Ambulatory Visit: Payer: Self-pay | Admitting: *Deleted

## 2023-11-08 DIAGNOSIS — I7025 Atherosclerosis of native arteries of other extremities with ulceration: Secondary | ICD-10-CM

## 2023-11-13 DIAGNOSIS — L89893 Pressure ulcer of other site, stage 3: Secondary | ICD-10-CM | POA: Diagnosis not present

## 2023-11-19 ENCOUNTER — Encounter (HOSPITAL_COMMUNITY): Payer: Self-pay | Admitting: Vascular Surgery

## 2023-11-19 ENCOUNTER — Encounter (HOSPITAL_COMMUNITY)
Admission: RE | Disposition: A | Discharge status: Home / Self Care | Payer: Self-pay | Source: Ambulatory Visit | Attending: Vascular Surgery

## 2023-11-19 ENCOUNTER — Ambulatory Visit (HOSPITAL_COMMUNITY)
Admission: RE | Admit: 2023-11-19 | Discharge: 2023-11-19 | Disposition: A | Discharge status: Home / Self Care | Source: Ambulatory Visit | Attending: Vascular Surgery | Admitting: Vascular Surgery

## 2023-11-19 ENCOUNTER — Other Ambulatory Visit: Payer: Self-pay

## 2023-11-19 DIAGNOSIS — I10 Essential (primary) hypertension: Secondary | ICD-10-CM | POA: Insufficient documentation

## 2023-11-19 DIAGNOSIS — Z7901 Long term (current) use of anticoagulants: Secondary | ICD-10-CM | POA: Insufficient documentation

## 2023-11-19 DIAGNOSIS — L97419 Non-pressure chronic ulcer of right heel and midfoot with unspecified severity: Secondary | ICD-10-CM | POA: Diagnosis not present

## 2023-11-19 DIAGNOSIS — I6529 Occlusion and stenosis of unspecified carotid artery: Secondary | ICD-10-CM | POA: Insufficient documentation

## 2023-11-19 DIAGNOSIS — E785 Hyperlipidemia, unspecified: Secondary | ICD-10-CM | POA: Diagnosis not present

## 2023-11-19 DIAGNOSIS — Z79899 Other long term (current) drug therapy: Secondary | ICD-10-CM | POA: Insufficient documentation

## 2023-11-19 DIAGNOSIS — I70234 Atherosclerosis of native arteries of right leg with ulceration of heel and midfoot: Secondary | ICD-10-CM | POA: Diagnosis not present

## 2023-11-19 DIAGNOSIS — Z87891 Personal history of nicotine dependence: Secondary | ICD-10-CM | POA: Insufficient documentation

## 2023-11-19 DIAGNOSIS — I7025 Atherosclerosis of native arteries of other extremities with ulceration: Secondary | ICD-10-CM

## 2023-11-19 DIAGNOSIS — I2510 Atherosclerotic heart disease of native coronary artery without angina pectoris: Secondary | ICD-10-CM | POA: Diagnosis not present

## 2023-11-19 HISTORY — PX: ABDOMINAL AORTOGRAM: CATH118222

## 2023-11-19 HISTORY — PX: LOWER EXTREMITY ANGIOGRAPHY: CATH118251

## 2023-11-19 LAB — POCT I-STAT, CHEM 8
BUN: 29 mg/dL — ABNORMAL HIGH (ref 8–23)
Calcium, Ion: 1.24 mmol/L (ref 1.15–1.40)
Chloride: 108 mmol/L (ref 98–111)
Creatinine, Ser: 1.2 mg/dL (ref 0.61–1.24)
Glucose, Bld: 136 mg/dL — ABNORMAL HIGH (ref 70–99)
HCT: 36 % — ABNORMAL LOW (ref 39.0–52.0)
Hemoglobin: 12.2 g/dL — ABNORMAL LOW (ref 13.0–17.0)
Potassium: 4.7 mmol/L (ref 3.5–5.1)
Sodium: 144 mmol/L (ref 135–145)
TCO2: 24 mmol/L (ref 22–32)

## 2023-11-19 MED ORDER — SODIUM CHLORIDE 0.9% FLUSH: 3.0000 mL | Freq: Two times a day (BID) | INTRAVENOUS | Status: DC

## 2023-11-19 MED ORDER — FENTANYL CITRATE (PF) 100 MCG/2ML IJ SOLN
INTRAMUSCULAR | Status: DC | PRN
  Administered 2023-11-19: 50 ug via INTRAVENOUS

## 2023-11-19 MED ORDER — MIDAZOLAM HCL 2 MG/2ML IJ SOLN
INTRAMUSCULAR | Status: AC
  Filled 2023-11-19: qty 2

## 2023-11-19 MED ORDER — LABETALOL HCL 5 MG/ML IV SOLN: 10.0000 mg | INTRAVENOUS | Status: DC | PRN

## 2023-11-19 MED ORDER — SODIUM CHLORIDE 0.9 % IV SOLN: INTRAVENOUS | Status: DC

## 2023-11-19 MED ORDER — MIDAZOLAM HCL 2 MG/2ML IJ SOLN
INTRAMUSCULAR | Status: DC | PRN
  Administered 2023-11-19: .5 mg via INTRAVENOUS

## 2023-11-19 MED ORDER — LABETALOL HCL 5 MG/ML IV SOLN
INTRAVENOUS | Status: AC
  Filled 2023-11-19: qty 4

## 2023-11-19 MED ORDER — LIDOCAINE HCL (PF) 1 % IJ SOLN
INTRAMUSCULAR | Status: AC
  Filled 2023-11-19: qty 30

## 2023-11-19 MED ORDER — LABETALOL HCL 5 MG/ML IV SOLN
INTRAVENOUS | Status: DC | PRN
  Administered 2023-11-19: 10 mg via INTRAVENOUS

## 2023-11-19 MED ORDER — ACETAMINOPHEN 325 MG PO TABS: 650.0000 mg | ORAL_TABLET | ORAL | Status: DC | PRN

## 2023-11-19 MED ORDER — ONDANSETRON HCL 4 MG/2ML IJ SOLN: 4.0000 mg | Freq: Four times a day (QID) | INTRAMUSCULAR | Status: DC | PRN

## 2023-11-19 MED ORDER — SODIUM CHLORIDE 0.9 % IV SOLN: 250.0000 mL | INTRAVENOUS | Status: DC | PRN

## 2023-11-19 MED ORDER — LIDOCAINE HCL (PF) 1 % IJ SOLN
INTRAMUSCULAR | Status: DC | PRN
  Administered 2023-11-19: 11 mL

## 2023-11-19 MED ORDER — IODIXANOL 320 MG/ML IV SOLN
INTRAVENOUS | Status: DC | PRN
  Administered 2023-11-19: 58 mL via INTRA_ARTERIAL

## 2023-11-19 MED ORDER — SODIUM CHLORIDE 0.9% FLUSH: 3.0000 mL | INTRAVENOUS | Status: DC | PRN

## 2023-11-19 MED ORDER — OXYCODONE HCL 5 MG PO TABS: 5.0000 mg | ORAL_TABLET | ORAL | Status: DC | PRN

## 2023-11-19 MED ORDER — HEPARIN (PORCINE) IN NACL 1000-0.9 UT/500ML-% IV SOLN
INTRAVENOUS | Status: DC | PRN
  Administered 2023-11-19: 1000 mL

## 2023-11-19 MED ORDER — FENTANYL CITRATE (PF) 100 MCG/2ML IJ SOLN
INTRAMUSCULAR | Status: AC
  Filled 2023-11-19: qty 2

## 2023-11-19 MED ORDER — HYDRALAZINE HCL 20 MG/ML IJ SOLN: 5.0000 mg | INTRAMUSCULAR | Status: DC | PRN

## 2023-11-19 NOTE — Interval H&P Note (Signed)
 History and Physical Interval Note:  11/19/2023 11:21 AM  Nathan Santiago  has presented today for surgery, with the diagnosis of atherosclerosis / bilateral lower extremities with ulceration.  The various methods of treatment have been discussed with the patient and family. After consideration of risks, benefits and other options for treatment, the patient has consented to  Procedure(s): ABDOMINAL AORTOGRAM (N/A) Lower Extremity Angiography (N/A) as a surgical intervention.  The patient's history has been reviewed, patient examined, no change in status, stable for surgery.  I have reviewed the patient's chart and labs.  Questions were answered to the patient's satisfaction.     Angela Kell

## 2023-11-19 NOTE — Op Note (Signed)
 Patient name: Nathan Santiago MRN: [id number] DOB: [birth year] Sex: male  11/19/2023 Pre-operative Diagnosis: Atherosclerosis native arteries with right lower extremity heel ulceration Post-operative diagnosis:  Same Surgeon:  Ace Holder C. Vikki Graves, MD Procedure Performed: 1.  Percutaneous ultrasound-guided cannulation left common femoral artery 2.  Catheter aorta and aortogram with bilateral lower extremity angiography 3.  Catheter selection right common femoral artery with right lower extremity angiogram 4.  Moderate sedation with fentanyl  and Versed  for 25 minutes  Indications: 89 year old male with a history of aortic stenting now with right heel ulceration and severely depressed toe pressure of 19.  He has known SFA stenosis and occlusion of his posterior tibial artery with two-vessel runoff via the anterior tibial and peroneal arteries and is now indicated for angiography with possible invention.  Findings: Aorta and the bilateral common iliac arteries were heavily calcified although there did not appear to be any flow-limiting stenosis.  The right extrailiac artery appears free of flow-limiting stenosis as of the left.  Bilateral common femorals are heavily diseased with significant calcifications in the right side.  The left side is subtotally occluded.  Both profunda femoris and SFAs are initially patent.  On the right side which is the site of interest there is a long segment stenosis followed by subtotal occlusion at adductor canal with subsequent occlusion at the knee and reconstitutes below the knee what appears to be a healthy right popliteal artery and anterior tibial artery runoff is dominant but he occludes the distal dorsalis pedis and also has peroneal artery runoff to the ankle.  Plan will be for discussion of right common femoral endarterectomy PTFE bypass to the below-knee popliteal artery for limb salvage.   Procedure:  The patient was identified in the holding area and taken  to room 8.  The patient was then placed supine on the table and prepped and draped in the usual sterile fashion.  A time out was called.  Ultrasound was used to evaluate the left common femoral artery.  This was heavily calcified and diseased.  The area around the groin was anesthetized with 1% lidocaine  and concomitantly we administered fentanyl  and Versed  as moderate sedation his vital signs were monitored throughout the case.  We able to use ultrasound to cannulate 1 small area that appeared healthy with a micropuncture needle followed by wire sheath and ultrasound image was saved for the record.  A Bentson wire was placed and we are able to track this into the aorta under fluoroscopic guidance and then placed a 5 French sheath and Omni catheter was placed to the level of L1 and aortogram was performed.  I then performed pelvic angiography by pulling down to just above the stent which appeared patent although there was heavy calcification around the stent and around the common iliac arteries bilaterally.  I then used a crossover catheter and Glidewire advanced across the aortic bifurcation into the right common femoral artery and perform right lower extremity angiography.  Given the multilevel disease including significant calcifications of the common femoral artery and occlusion of the SFA I have elected for discussion of common femoral endarterectomy with bypass to the below-knee popliteal artery likely with PTFE.  Patient is unwilling to discuss surgery at this time he will be scheduled to see me in the office for further discussion.  Catheter and wire were removed and sheath will be pulled in postoperative holding.  He did tolerate the procedure without any complication.  Contrast: 58cc   Maricruz Lucero C. Vikki Graves, MD  [facility] Office: [phone] Pager: [phone]

## 2023-11-19 NOTE — Discharge Instructions (Signed)
 Femoral Site Care This sheet gives you information about how to care for yourself after your procedure. Your health care provider may also give you more specific instructions. If you have problems or questions, contact your health care provider. What can I expect after the procedure? After the procedure, it is common to have: Bruising that usually fades within 1-2 weeks. Tenderness at the site. Follow these instructions at home: Wound care May remove bandage after 24 hours. Do not take baths, swim, or use a hot tub for 5 days. You may shower 24-48 hours after the procedure. Gently wash the site with plain soap and water. Pat the area dry with a clean towel. Do not rub the site. This may cause bleeding. Do not apply powder or lotion to the site. Keep the site clean and dry. Check your femoral site every day for signs of infection. Check for: Redness, swelling, or pain. Fluid or blood. Warmth. Pus or a bad smell. Activity For the first 2-3 days after your procedure, or as long as directed: Avoid climbing stairs as much as possible. Do not squat. Do not lift, push or pull anything that is heavier than 10 lb for 5 days. Rest as directed. Avoid sitting for a long time without moving. Get up to take short walks every 1-2 hours. Do not drive for 24 hours. General instructions Take over-the-counter and prescription medicines only as told by your health care provider. Keep all follow-up visits as told by your health care provider. This is important. DRINK PLENTY OF FLUIDS FOR THE NEXT 2-3 DAYS. Contact a health care provider if you have: A fever or chills. You have redness, swelling, or pain around your insertion site. Get help right away if: The catheter insertion area swells very fast. You pass out. You suddenly start to sweat or your skin gets clammy. The catheter insertion area is bleeding, and the bleeding does not stop when you hold steady pressure on the area. The area near or  just beyond the catheter insertion site becomes pale, cool, tingly, or numb. These symptoms may represent a serious problem that is an emergency. Do not wait to see if the symptoms will go away. Get medical help right away. Call your local emergency services (911 in the [place] Do not drive yourself to the hospital. Summary After the procedure, it is common to have bruising that usually fades within 1-2 weeks. Check your femoral site every day for signs of infection. Do not lift, push or pull anything that is heavier than 10 lb for 5 days.  This information is not intended to replace advice given to you by your health care provider. Make sure you discuss any questions you have with your health care provider. Document Revised: 07/30/2017 Document Reviewed: 07/30/2017 Elsevier Patient Education  2020 [employer].

## 2023-11-19 NOTE — Progress Notes (Addendum)
 5 French sheath pulled. Vitals stayed good during. Pressure held fro 20 mins with no complication. 4 hour bedrest started at 1450.

## 2023-11-21 ENCOUNTER — Ambulatory Visit: Payer: Medicare Other | Attending: Cardiovascular Disease | Admitting: Cardiovascular Disease

## 2023-11-21 ENCOUNTER — Encounter: Payer: Self-pay | Admitting: Cardiovascular Disease

## 2023-11-21 VITALS — BP 152/60 | HR 61 | Ht 64.0 in | Wt 185.4 lb

## 2023-11-21 DIAGNOSIS — Z8679 Personal history of other diseases of the circulatory system: Secondary | ICD-10-CM | POA: Diagnosis not present

## 2023-11-21 DIAGNOSIS — I701 Atherosclerosis of renal artery: Secondary | ICD-10-CM | POA: Diagnosis not present

## 2023-11-21 DIAGNOSIS — I6521 Occlusion and stenosis of right carotid artery: Secondary | ICD-10-CM | POA: Diagnosis not present

## 2023-11-21 DIAGNOSIS — I10 Essential (primary) hypertension: Secondary | ICD-10-CM

## 2023-11-21 DIAGNOSIS — I739 Peripheral vascular disease, unspecified: Secondary | ICD-10-CM | POA: Diagnosis not present

## 2023-11-21 DIAGNOSIS — I4891 Unspecified atrial fibrillation: Secondary | ICD-10-CM | POA: Diagnosis not present

## 2023-11-21 DIAGNOSIS — I5033 Acute on chronic diastolic (congestive) heart failure: Secondary | ICD-10-CM | POA: Diagnosis not present

## 2023-11-21 NOTE — Assessment & Plan Note (Signed)
 History of PAF maintaining sinus rhythm on Eliquis oral anticoagulation.

## 2023-11-21 NOTE — Progress Notes (Signed)
 11/21/2023 Nathan Santiago   June 17, 1931  [id number]  Primary Physician Nathan Shiley, MD Primary Cardiologist: Nathan Leigh MD Nathan Santiago, [place], [place]  HPI:  Nathan Santiago is a 89 y.o.  mildly overweight married Caucasian male father of 2 who worked as an Technical sales engineer. He was formally a patient of Dr. Rosalee Santiago.  I last saw him in the office 12/06/2021.  He is accompanied by his wife Nathan Santiago today.Nathan Santiago His cardiovascular status profile is remarkable for remote tobacco abuse, 2 hypertension and hyperlipidemia. He is not diabetic. [place] 5 days a week 25 minutes a day. He hasn't known cardiovascular disease status post stenting of his proximal mid and distal RCA using overlapping stents as well as the proximal AV groove circumflex with a known occluded OM branch which is old. He has normal LV function by 2-D echo performed 02/07/12 a nonischemic Myoview  2010. He denies chest pain, shortness of breath or claudication. He had carotid Dopplers performed 01/09/14 that showed a widely patent right carotid endarterectomy site with moderate left ICA stenosis remained stable. Since I saw him in January of this year he's noticed increasing bilateral lower extremity claudication which is lifestyle limiting. Dopplers have shown a decline in his ABIs bilaterally. He does have high frequency signal in his right greater than left iliac systems. I performed peripheral angiography on him 04/24/16 revealing a 75% distal abdominal aortic stenosis with a 60 mm pullback gradient. He also had a 95% calcified distal left common femoral artery stenosis and an 80% segmental mid right SFA stenosis with 2 vessel runoff bilaterally. I stented his distal abdominal aorta with a 10 x 29 mm long balloon expandable stent. His ABIs and increased from 0.7 bilaterally up to 1 on the right and 0.8 on the left. His claudication completely resolved. He was able to go on a trip  to the [facility] and ambulated without limitation. Since I saw him a year ago he is complained of bilateral thigh claudication which is lifestyle limiting.  His Dopplers do appear to show moderate iliac disease.    I performed outpatient peripheral angiography on 08/05/2018 revealing a patent distal abdominal aortic stent without a gradient and otherwise unchanged anatomy with a 95% calcified left common femoral artery stenosis and bilateral moderate SFA disease.   He continues to complain of bilateral lifestyle limiting claudication.  He did have Doppler studies performed 11/17/2020 revealing what appears to be a patent distal aortic stent and iliac arteries with a high-frequency signal in the distal left common femoral and distal right SFA.  I am going to refer him to Nathan Santiago  who performed his carotid endarterectomy 11/15/2010.  I performed peripheral angiography on him 09/26/2021 revealing a 90% left renal artery stenosis, patent distal abdominal aortic stent and high-grade calcified left greater than right common femoral artery stenosis as well as focal bilateral mid SFA disease.  I did refer him to Nathan Santiago who performed his endarterectomies in the past.  Nathan Santiago saw him on 09/29/2021 for consideration of common femoral endarterectomy which she thought the patient was not a good candidate for because of his age and comorbidities.   Since I saw him 2 years ago he was admitted to the hospital last month with diastolic heart failure and A-fib.  He also underwent peripheral angiography by Dr. Vikki Santiago because of a nonhealing ulcer on his right heel.  Right common femoral  endarterectomy and patch angioplasty is being considered although I suspect he is at moderately elevated risk because of his age and comorbidities.  He gets around in a motorized wheelchair and is nonambulatory.  He does live in an assisted care facility ([facility]) with his wife who is an independent living.  Current Meds   Medication Sig   amLODipine  (NORVASC ) 10 MG tablet Take 10 mg by mouth in the morning.   apixaban  (ELIQUIS ) 5 MG TABS tablet Take 1 tablet (5 mg total) by mouth 2 (two) times daily.   Apoaequorin (PREVAGEN PO) Take 1 tablet by mouth in the morning.   atorvastatin  (LIPITOR) 40 MG tablet Take 40 mg by mouth daily at 6 PM.    Biotin  10 MG TABS Take 10 mg by mouth in the morning.   cetirizine  (ZYRTEC ) 10 MG tablet Take 1 tablet (10 mg total) by mouth at bedtime.   chlorthalidone  (HYGROTON ) 50 MG tablet Take 50 mg by mouth in the morning. Hold if BP < 95   cholecalciferol  (VITAMIN D3) 25 MCG (1000 UT) tablet Take 1,000 Units by mouth in the morning.   cloNIDine  (CATAPRES ) 0.1 MG tablet Take 1 tablet (0.1 mg total) by mouth 3 (three) times daily. (Patient taking differently: Take 0.1 mg by mouth 3 (three) times daily. Hold if BP < 95)   finasteride  (PROSCAR ) 5 MG tablet Take 5 mg by mouth in the morning.   gabapentin  (NEURONTIN ) 100 MG capsule Take 200 mg by mouth 3 (three) times daily.   Glucosamine-Chondroitin 750-600 MG TABS Take 1 tablet by mouth 2 (two) times daily.   hydrALAZINE  (APRESOLINE ) 25 MG tablet Take 25 mg by mouth in the morning and at bedtime. Hold if BP < 95   hydrALAZINE  (APRESOLINE ) 50 MG tablet Take 50 mg by mouth daily at 4 PM. Hold if BP <95   iron  polysaccharides (NIFEREX) 150 MG capsule Take 150 mg by mouth every Monday, Wednesday, and Friday.   losartan  (COZAAR ) 50 MG tablet Take 50 mg by mouth 2 (two) times daily.   melatonin 5 MG TABS Take 1 tablet (5 mg total) by mouth at bedtime.   metoprolol  tartrate (LOPRESSOR ) 50 MG tablet Take 75 mg by mouth 2 (two) times daily.   Multiple Vitamin (MULTIVITAMIN WITH MINERALS) TABS tablet Take 1 tablet by mouth in the morning.   pantoprazole  (PROTONIX ) 40 MG tablet Take 40 mg by mouth 2 (two) times daily.   potassium chloride  SA (K-DUR,KLOR-CON ) 20 MEQ tablet Take 20 mEq by mouth every evening.    tamsulosin  (FLOMAX ) 0.4 MG CAPS  capsule Take 1 capsule (0.4 mg total) by mouth daily after supper.     Allergies  Allergen Reactions   Lisinopril Cough   Niaspan [Niacin Er (Antihyperlipidemic)] Rash    Social History   Socioeconomic History   Marital status: Married    Spouse name: Nathan Santiago   Number of children: 2   Years of education: BA   Highest education level: Not on file  Occupational History   Occupation: Retired  Tobacco Use   Smoking status: Former    Current packs/day: 0.00    Average packs/day: 2.0 packs/day for 20.0 years (40.0 ttl pk-yrs)    Types: Cigarettes    Start date: 07/31/1948    Quit date: 07/31/1968    Years since quitting: 55.3   Smokeless tobacco: Never  Vaping Use   Vaping status: Never Used  Substance and Sexual Activity   Alcohol  use: Not Currently    Alcohol /week:  4.0 standard drinks of alcohol     Types: 4 Glasses of wine per week   Drug use: Never   Sexual activity: Not Currently  Other Topics Concern   Not on file  Social History Narrative   Lives with wife   Caffeine use: Decaf coffee, tea   Left handed   Social Drivers of Health   Financial Resource Strain: Low Risk  (11/28/2022)   Overall Financial Resource Strain (CARDIA)    Difficulty of Paying Living Expenses: Not hard at all  Food Insecurity: No Food Insecurity (09/26/2023)   Hunger Vital Sign    Worried About Running Out of Food in the Last Year: Never true    Ran Out of Food in the Last Year: Never true  Transportation Needs: No Transportation Needs (09/26/2023)   PRAPARE - Administrator, Civil Service (Medical): No    Lack of Transportation (Non-Medical): No  Physical Activity: Insufficiently Active (11/28/2022)   Exercise Vital Sign    Days of Exercise per Week: 3 days    Minutes of Exercise per Session: 30 min  Stress: No Stress Concern Present (11/28/2022)   [employer] of Occupational Health - Occupational Stress Questionnaire    Feeling of Stress : Not at all  Social Connections:  Socially Integrated (09/26/2023)   Social Connection and Isolation Panel [NHANES]    Frequency of Communication with Friends and Family: More than three times a week    Frequency of Social Gatherings with Friends and Family: More than three times a week    Attends Religious Services: More than 4 times per year    Active Member of [employer] or Organizations: Yes    Attends Engineer, structural: More than 4 times per year    Marital Status: Married  Catering manager Violence: Not At Risk (09/26/2023)   Humiliation, Afraid, Rape, and Kick questionnaire    Fear of Current or Ex-Partner: No    Emotionally Abused: No    Physically Abused: No    Sexually Abused: No     Review of Systems: General: negative for chills, fever, night sweats or weight changes.  Cardiovascular: negative for chest pain, dyspnea on exertion, edema, orthopnea, palpitations, paroxysmal nocturnal dyspnea or shortness of breath Dermatological: negative for rash Respiratory: negative for cough or wheezing Urologic: negative for hematuria Abdominal: negative for nausea, vomiting, diarrhea, bright red blood per rectum, melena, or hematemesis Neurologic: negative for visual changes, syncope, or dizziness All other systems reviewed and are otherwise negative except as noted above.    Blood pressure (!) 152/60, pulse 61, height 5\' 4"  (1.626 m), weight 185 lb 6.4 oz (84.1 kg), SpO2 94%.  General appearance: alert and no distress Neck: no adenopathy, no carotid bruit, no JVD, supple, symmetrical, trachea midline, and thyroid  not enlarged, symmetric, no tenderness/mass/nodules Lungs: clear to auscultation bilaterally Heart: regular rate and rhythm, S1, S2 normal, no murmur, click, rub or gallop Extremities: 1+ lower extremity edema bilaterally Pulses: Absent pedal pulses Skin: Skin color, texture, turgor normal. No rashes or lesions Neurologic: Grossly normal  EKG EKG Interpretation Date/Time:  Wednesday November 21 2023  10:57:48 EDT Ventricular Rate:  61 PR Interval:  202 QRS Duration:  80 QT Interval:  456 QTC Calculation: 459 R Axis:   40  Text Interpretation: Normal sinus rhythm Nonspecific ST and T wave abnormality When compared with ECG of 28-Sep-2023 04:45, Sinus rhythm has replaced Atrial fibrillation Vent. rate has decreased BY  62 BPM ST no longer depressed in Inferior  leads ST no longer depressed in Anterolateral leads T wave inversion no longer evident in Inferior leads T wave inversion no longer evident in Anterior leads Confirmed by Lauro Portal [id number]) on 11/21/2023 11:18:52 AM    ASSESSMENT AND PLAN:   Essential hypertension History of essential hypertension with blood pressure measured today at 152/60.  He is on amlodipine , clonidine , hydralazine , losartan  and metoprolol .  History of CAD (coronary artery disease) History of CAD status post stenting of his proximal mid and distal RCA using overlapping stents as well as proximal AV groove circumflex with known occluded OM branch which is old remotely.  He has not had coronary intervention since and denies chest pain.  Peripheral arterial disease (HCC) Long history of PAD status post peripheral angiography which I performed 04/24/2016 notable for 75% distal abdominal aortic stenosis with a 60 mm pullback gradient also had a 95% calcified distal left common femoral artery stenosis, 80% segmental mid right SFA stenosis with two-vessel runoff.  I stented his distal abdominal aorta with a 10 mm x 29 mm long balloon expandable stent.  His ABIs increased from 0.7 bilaterally up to 1 on the right and 0.8 on the left.  His claudication resolved at that time.  I performed peripheral angiography on him 08/05/2018 revealing a patent distal abdominal aortic stent without a gradient and otherwise unchanged anatomy.  His last peripheral angiogram which I performed was on 09/26/2021 revealing a 90% left renal artery stenosis, patent distal abdominal aortic stent with  high-grade calcified left greater than right common femoral artery stenosis as well as focal bilateral mid SFA disease.  I referred him to Dr. Kermit Ped for bilateral common femoral endarterectomies.  He saw him 09/29/2021 for consideration of this but thought he was not a good candidate because of his age and comorbidities.  He apparently has a wound on his right heel and recently underwent angiography by Dr. Vikki Santiago 11/19/23 who apparently is planning right common femoral endarterectomy for "critical of ischemia".  Atrial fibrillation with rapid ventricular response (HCC) History of PAF maintaining sinus rhythm on Eliquis  oral anticoagulation.  Renal artery stenosis (HCC) 90% left renal artery stenosis felt at the time of angiography in the past.  Intervention was not performed.  Carotid artery disease (HCC) History of carotid artery disease status post right carotid endarterectomy in the past with Doppler studies performed 08/29/2023 revealing his endarterectomy site to be widely patent.     Nathan Leigh MD FACP,FACC,FAHA, [facility] 11/21/2023 11:33 AM

## 2023-11-21 NOTE — Assessment & Plan Note (Signed)
 History of CAD status post stenting of his proximal mid and distal RCA using overlapping stents as well as proximal AV groove circumflex with known occluded OM branch which is old remotely.  He has not had coronary intervention since and denies chest pain.

## 2023-11-21 NOTE — Patient Instructions (Signed)
 Medication Instructions:  Your physician recommends that you continue on your current medications as directed. Please refer to the Current Medication list given to you today.  *If you need a refill on your cardiac medications before your next appointment, please call your pharmacy*  Follow-Up: At [facility], you and your health needs are our priority.  As part of our continuing mission to provide you with exceptional heart care, our providers are all part of one team.  This team includes your primary Cardiologist (physician) and Advanced Practice Providers or APPs (Physician Assistants and Nurse Practitioners) who all work together to provide you with the care you need, when you need it.  Your next appointment:   6 month(s)  Provider:   Hao Meng, PA-C       Then, Lauro Portal, MD will plan to see you again in 12 month(s).    We recommend signing up for the patient portal called "MyChart".  Sign up information is provided on this After Visit Summary.  MyChart is used to connect with patients for Virtual Visits (Telemedicine).  Patients are able to view lab/test results, encounter notes, upcoming appointments, etc.  Non-urgent messages can be sent to your provider as well.   To learn more about what you can do with MyChart, go to [email or web address].   Other Instructions       1st Floor: - Lobby - Registration  - Pharmacy  - Lab - Cafe  2nd Floor: - PV Lab - Diagnostic Testing (echo, CT, nuclear med)  3rd Floor: - Vacant  4th Floor: - TCTS (cardiothoracic surgery) - AFib Clinic - Structural Heart Clinic - Vascular Surgery  - Vascular Ultrasound  5th Floor: - HeartCare Cardiology (general and EP) - Clinical Pharmacy for coumadin, hypertension, lipid, weight-loss medications, and med management appointments    Valet parking services will be available as well.

## 2023-11-21 NOTE — Assessment & Plan Note (Signed)
 History of carotid artery disease status post right carotid endarterectomy in the past with Doppler studies performed 08/29/2023 revealing his endarterectomy site to be widely patent.

## 2023-11-21 NOTE — Assessment & Plan Note (Signed)
 Long history of PAD status post peripheral angiography which I performed 04/24/2016 notable for 75% distal abdominal aortic stenosis with a 60 mm pullback gradient also had a 95% calcified distal left common femoral artery stenosis, 80% segmental mid right SFA stenosis with two-vessel runoff.  I stented his distal abdominal aorta with a 10 mm x 29 mm long balloon expandable stent.  His ABIs increased from 0.7 bilaterally up to 1 on the right and 0.8 on the left.  His claudication resolved at that time.  I performed peripheral angiography on him 08/05/2018 revealing a patent distal abdominal aortic stent without a gradient and otherwise unchanged anatomy.  His last peripheral angiogram which I performed was on 09/26/2021 revealing a 90% left renal artery stenosis, patent distal abdominal aortic stent with high-grade calcified left greater than right common femoral artery stenosis as well as focal bilateral mid SFA disease.  I referred him to Dr. Kermit Ped for bilateral common femoral endarterectomies.  He saw him 09/29/2021 for consideration of this but thought he was not a good candidate because of his age and comorbidities.  He apparently has a wound on his right heel and recently underwent angiography by Dr. Vikki Graves 11/19/23 who apparently is planning right common femoral endarterectomy for "critical of ischemia".

## 2023-11-21 NOTE — Assessment & Plan Note (Signed)
 90% left renal artery stenosis felt at the time of angiography in the past.  Intervention was not performed.

## 2023-11-21 NOTE — Assessment & Plan Note (Signed)
 History of essential hypertension with blood pressure measured today at 152/60.  He is on amlodipine , clonidine , hydralazine , losartan  and metoprolol .

## 2023-11-26 ENCOUNTER — Encounter: Payer: Self-pay | Admitting: Internal Medicine

## 2023-11-26 ENCOUNTER — Non-Acute Institutional Stay (SKILLED_NURSING_FACILITY): Admitting: Internal Medicine

## 2023-11-26 DIAGNOSIS — I5032 Chronic diastolic (congestive) heart failure: Secondary | ICD-10-CM

## 2023-11-26 DIAGNOSIS — I739 Peripheral vascular disease, unspecified: Secondary | ICD-10-CM | POA: Diagnosis not present

## 2023-11-26 DIAGNOSIS — E782 Mixed hyperlipidemia: Secondary | ICD-10-CM

## 2023-11-26 DIAGNOSIS — N1832 Chronic kidney disease, stage 3b: Secondary | ICD-10-CM | POA: Diagnosis not present

## 2023-11-26 DIAGNOSIS — I10 Essential (primary) hypertension: Secondary | ICD-10-CM | POA: Diagnosis not present

## 2023-11-26 DIAGNOSIS — I480 Paroxysmal atrial fibrillation: Secondary | ICD-10-CM

## 2023-11-26 NOTE — Progress Notes (Signed)
 Location: Medical illustrator of Service:  SNF (31)  Provider:   Code Status: DNR Goals of Care:     11/19/2023   11:24 AM  Advanced Directives  Does Patient Have a Medical Advance Directive? Yes  Type of Advance Directive Living will;Healthcare Power of Attorney     Chief Complaint  Patient presents with   Acute Visit    HPI: Patient is a 89 y.o. male seen today for an acute visit for Follow up  Lives in [facility]  He has h/o PAD and had Eval for Right Heel Pain and Possible Heel Wound  He underwent Abdominal Aortogram on 11/19/2023 by Dr Vikki Graves It showed Severe Pad Including right side there is a long segment stenosis followed by subtotal occlusion at adductor canal with subsequent occlusion at the knee and reconstitutes below the knee what appears to be a healthy right popliteal artery and anterior tibial artery runoff is dominant but he occludes the distal dorsalis pedis and also has peroneal artery runoff to the ankle.  He has Recommended right common femoral endarterectomy PTFE bypass to the below-knee popliteal artery for limb salvage.    Patient was Noticed ot have redness in his upper  Leg in thigh area and there was concern that he has Cellulitis He denies any Pain or discomfort  No Fever  His Right Heel wound is also healing well with Clear Base and No pain   He also Has h/o   HLD,  CAD h/o Urinary Retention with Hydronephrosis and stent  Hx of AAA ICA stenosis and PAD, followed by Dr Katheryne Pane. Last carotid US  showing bilateral ICA stenosis   Mild Cognitive impairment MMSE 27/30 in 03/24 S/p THA in 12/23   Past Medical History:  Diagnosis Date   AAA (abdominal aortic aneurysm) (HCC)    asymptomatic   AAA (abdominal aortic aneurysm) (HCC) 2010   peripheral  angiogram-- bilateral SFA DISEASE  and 60 to 70% infrarenaal abd. aortic stenosis with 15 -mm gradient   Adenomatous colon polyp 11/2003   CAD (coronary artery disease)    Gait  abnormality 02/13/2017   Gastroparesis    pt denies   GERD (gastroesophageal reflux disease)    w/ LPR   History of kidney stones    x2   Hyperlipidemia    Hypertension    IDA (iron  deficiency anemia)    Peripheral arterial disease (HCC)    RCEA  by Dr Paulette Borrow   PUD (peptic ulcer disease)    pt unaware   Vertigo     Past Surgical History:  Procedure Laterality Date   ABDOMINAL AORTOGRAM N/A 11/19/2023   Procedure: ABDOMINAL AORTOGRAM;  Surgeon: Adine Hoof, MD;  Location: [facility] INVASIVE CV LAB;  Service: Cardiovascular;  Laterality: N/A;   ABDOMINAL AORTOGRAM W/LOWER EXTREMITY Bilateral 08/05/2018   Procedure: ABDOMINAL AORTOGRAM W/LOWER EXTREMITY;  Surgeon: Avanell Leigh, MD;  Location: [facility] INVASIVE CV LAB;  Service: Cardiovascular;  Laterality: Bilateral;   ABDOMINAL AORTOGRAM W/LOWER EXTREMITY N/A 09/26/2021   Procedure: ABDOMINAL AORTOGRAM W/LOWER EXTREMITY;  Surgeon: Avanell Leigh, MD;  Location: [facility] INVASIVE CV LAB;  Service: Cardiovascular;  Laterality: N/A;   AORTOGRAM  04/24/2016    Abdominal aortogram, bilateral iliac angiogram, bifemoral runoff   CARDIAC CATHETERIZATION  05/12/2005   RCA   carotid doppler  01/24/2013   RICA endarterectomy,left CCA 0-49%; left bulb and prox ICA 50-69%; bilaateral subclavian < 50%   CAROTID ENDARTERECTOMY  11/01/2010   CATARACT EXTRACTION  COLONOSCOPY     CORONARY ANGIOPLASTY  05/18/2005   2 STENTS distal RCA AND PROXIMAL-MID RCA   5 total stents per pt.   CYSTOSCOPY/URETEROSCOPY/HOLMIUM LASER/STENT PLACEMENT Left 01/11/2018   Procedure: LEFT URETEROSCOPY/HOLMIUM LASER/STENT PLACEMENT;  Surgeon: Andrez Banker, MD;  Location: [facility] ORS;  Service: Urology;  Laterality: Left;   DOPPLER ECHOCARDIOGRAPHY  02/07/2012   EF 55%,SHOWED NO ISCHEMIA    HERNIA REPAIR     umbilical   lower arterial  doppler  02/04/2013   aotra 1.5 x 1.5 cm; distal abd aorta 70-99%,proximal common iliac arteries -very stenotic with  increased velocities>50%,may be falsely elevated as a result of residual plaque from the distal aorta stenosis   LOWER EXTREMITY ANGIOGRAPHY N/A 11/19/2023   Procedure: Lower Extremity Angiography;  Surgeon: Adine Hoof, MD;  Location: [facility] INVASIVE CV LAB;  Service: Cardiovascular;  Laterality: N/A;   lower extremity doppler  June 18 ,2013   ABI'S ABNORMAL, RABI was 0.88 and LABI 0.75 ,with 3-vessel  run off   NM MYOVIEW  LTD  MAY 23,2011   showed no significant ischemia;   NM MYOVIEW  LTD  04/22/2008   ef 77%,exercise capcity 6 METS ,exaggerated blood pressure response to exercise   PERIPHERAL VASCULAR CATHETERIZATION N/A 04/24/2016   Procedure: Lower Extremity Angiography;  Surgeon: Avanell Leigh, MD;  Location: [facility] INVASIVE CV LAB;  Service: Cardiovascular;  Laterality: N/A;   PERIPHERAL VASCULAR CATHETERIZATION  04/24/2016   Procedure: Peripheral Vascular Intervention;  Surgeon: Avanell Leigh, MD;  Location: [facility] INVASIVE CV LAB;  Service: Cardiovascular;;  Aorta   retrograde central aortic catheterization  05/19/2005   cutting balloon atherectomy, c-circ stenosis with DES STENTING CYPHER   TONSILLECTOMY     TOTAL HIP ARTHROPLASTY Right 07/11/2022   Procedure: TOTAL HIP ARTHROPLASTY ANTERIOR APPROACH;  Surgeon: Claiborne Crew, MD;  Location: [facility] ORS;  Service: Orthopedics;  Laterality: Right;   TOTAL KNEE ARTHROPLASTY Left 01/03/2019   Procedure: TOTAL KNEE ARTHROPLASTY;  Surgeon: Winston Hawking, MD;  Location: [facility] ORS;  Service: Orthopedics;  Laterality: Left;  with IS block   TRANSURETHRAL RESECTION OF PROSTATE N/A 09/08/2016   Procedure: TRANSURETHRAL RESECTION OF THE PROSTATE (TURP);  Surgeon: Andrez Banker, MD;  Location: [facility];  Service: Urology;  Laterality: N/A;   TRANSURETHRAL RESECTION OF PROSTATE     10-10-17  Dr. Dulcy Gibney   TRANSURETHRAL RESECTION OF PROSTATE N/A 10/10/2017   Procedure: TRANSURETHRAL RESECTION OF THE PROSTATE (TURP);  Surgeon: Andrez Banker, MD;   Location: [facility] ORS;  Service: Urology;  Laterality: N/A;   VASECTOMY      Allergies  Allergen Reactions   Lisinopril Cough   Niaspan [Niacin Er (Antihyperlipidemic)] Rash    Outpatient Encounter Medications as of 11/26/2023  Medication Sig   amLODipine  (NORVASC ) 10 MG tablet Take 10 mg by mouth in the morning.   apixaban  (ELIQUIS ) 5 MG TABS tablet Take 1 tablet (5 mg total) by mouth 2 (two) times daily.   Apoaequorin (PREVAGEN PO) Take 1 tablet by mouth in the morning.   atorvastatin  (LIPITOR) 40 MG tablet Take 40 mg by mouth daily at 6 PM.    Biotin  10 MG TABS Take 10 mg by mouth in the morning.   cetirizine  (ZYRTEC ) 10 MG tablet Take 1 tablet (10 mg total) by mouth at bedtime.   chlorthalidone  (HYGROTON ) 50 MG tablet Take 50 mg by mouth in the morning. Hold if BP < 95   cholecalciferol  (VITAMIN D3) 25 MCG (1000 UT) tablet Take 1,000 Units  by mouth in the morning.   cloNIDine  (CATAPRES ) 0.1 MG tablet Take 1 tablet (0.1 mg total) by mouth 3 (three) times daily. (Patient taking differently: Take 0.1 mg by mouth 3 (three) times daily. Hold if BP < 95)   finasteride  (PROSCAR ) 5 MG tablet Take 5 mg by mouth in the morning.   gabapentin  (NEURONTIN ) 100 MG capsule Take 200 mg by mouth 3 (three) times daily.   Glucosamine-Chondroitin 750-600 MG TABS Take 1 tablet by mouth 2 (two) times daily.   hydrALAZINE  (APRESOLINE ) 25 MG tablet Take 25 mg by mouth in the morning and at bedtime. Hold if BP < 95   hydrALAZINE  (APRESOLINE ) 50 MG tablet Take 50 mg by mouth daily at 4 PM. Hold if BP <95   iron  polysaccharides (NIFEREX) 150 MG capsule Take 150 mg by mouth every Monday, Wednesday, and Friday.   losartan  (COZAAR ) 50 MG tablet Take 50 mg by mouth 2 (two) times daily.   melatonin 5 MG TABS Take 1 tablet (5 mg total) by mouth at bedtime.   metoprolol  tartrate (LOPRESSOR ) 50 MG tablet Take 75 mg by mouth 2 (two) times daily.   Multiple Vitamin (MULTIVITAMIN WITH MINERALS) TABS tablet Take 1 tablet by  mouth in the morning.   pantoprazole  (PROTONIX ) 40 MG tablet Take 40 mg by mouth 2 (two) times daily.   potassium chloride  SA (K-DUR,KLOR-CON ) 20 MEQ tablet Take 20 mEq by mouth every evening.    tamsulosin  (FLOMAX ) 0.4 MG CAPS capsule Take 1 capsule (0.4 mg total) by mouth daily after supper.   No facility-administered encounter medications on file as of 11/26/2023.    Review of Systems:  Review of Systems  Constitutional:  Negative for activity change, appetite change and unexpected weight change.  HENT: Negative.    Respiratory:  Negative for cough and shortness of breath.   Cardiovascular:  Positive for leg swelling.  Gastrointestinal:  Negative for constipation.  Genitourinary:  Negative for frequency.  Musculoskeletal:  Positive for gait problem. Negative for arthralgias and myalgias.  Skin: Negative.  Negative for rash.  Neurological:  Negative for dizziness and weakness.  Psychiatric/Behavioral:  Negative for confusion and sleep disturbance.   All other systems reviewed and are negative.   Health Maintenance  Topic Date Due   Pneumonia Vaccine 70+ Years old (3 of 3 - PCV) 09/25/2019   COVID-19 Vaccine (7 - 2024-25 season) 07/17/2023   DTaP/Tdap/Td (2 - Tdap) 09/09/2023   Medicare Annual Wellness (AWV)  11/28/2023   INFLUENZA VACCINE  02/29/2024   Zoster Vaccines- Shingrix  Completed   HPV VACCINES  Aged Out   Meningococcal B Vaccine  Aged Out    Physical Exam: Vitals:   11/26/23 1305  BP: (!) 168/63  Pulse: 62  Temp: 97.9 F (36.6 C)  Weight: 184 lb (83.5 kg)   Body mass index is 31.58 kg/m. Physical Exam Vitals reviewed.  Constitutional:      Appearance: Normal appearance.  HENT:     Head: Normocephalic.     Nose: Nose normal.     Mouth/Throat:     Mouth: Mucous membranes are moist.     Pharynx: Oropharynx is clear.  Eyes:     Pupils: Pupils are equal, round, and reactive to light.  Cardiovascular:     Rate and Rhythm: Normal rate and regular rhythm.      Pulses: Normal pulses.     Heart sounds: No murmur heard. Pulmonary:     Effort: Pulmonary effort is normal. No respiratory  distress.     Breath sounds: Normal breath sounds. No rales.  Abdominal:     General: Abdomen is flat. Bowel sounds are normal.     Palpations: Abdomen is soft.  Musculoskeletal:        General: No swelling.     Cervical back: Neck supple.     Comments: Mild Edema Bilateral Some redness in Left Leg but no concerns for infection No Pain  Warmth in both Legs Bruise is almost resolved Not tender   Skin:    General: Skin is warm.     Comments: Right Heel wound has Clear base and Good Granulation tissue  Neurological:     General: No focal deficit present.     Mental Status: He is alert.  Psychiatric:        Mood and Affect: Mood normal.        Thought Content: Thought content normal.     Labs reviewed: Basic Metabolic Panel: Recent Labs    06/18/23 0000 07/30/23 0000 09/28/23 0236 09/29/23 0752 09/30/23 0231 10/01/23 0234 11/19/23 1128  NA 144   < > 142 144 142 140 144  K 4.3   < > 3.7 3.7 3.4* 3.6 4.7  CL 110*   < > 106 108 107 106 108  CO2 23*   < > 26 25 24 24   --   GLUCOSE  --    < > 120* 127* 111* 102* 136*  BUN 29*   < > 49* 40* 37* 32* 29*  CREATININE 1.3   < > 1.99* 1.39* 1.37* 1.16 1.20  CALCIUM  9.4   < > 8.7* 9.2 8.8* 8.8*  --   MG  --   --  2.1  --   --  2.2  --   TSH 6.02*  --   --   --   --   --   --    < > = values in this interval not displayed.   Liver Function Tests: Recent Labs    06/18/23 0000  AST 16  ALT 15  ALKPHOS 91  ALBUMIN 3.9   No results for input(s): "LIPASE", "AMYLASE" in the last 8760 hours. No results for input(s): "AMMONIA" in the last 8760 hours. CBC: Recent Labs    09/25/23 2212 09/27/23 0228 09/29/23 0752 09/30/23 0231 11/19/23 1128  WBC 7.4 9.3 9.4 7.1  --   NEUTROABS 5.6  --   --   --   --   HGB 11.7* 10.3* 11.1* 10.0* 12.2*  HCT 36.7* 31.0* 33.3* 30.9* 36.0*  MCV 91.5 88.3 88.6  90.4  --   PLT 181 187 185 184  --    Lipid Panel: No results for input(s): "CHOL", "HDL", "LDLCALC", "TRIG", "CHOLHDL", "LDLDIRECT" in the last 8760 hours. Lab Results  Component Value Date   HGBA1C 5.5 11/23/2016    Procedures since last visit: PERIPHERAL VASCULAR CATHETERIZATION Result Date: 11/19/2023 Images from the original result were not included. Patient name: Nathan Santiago MRN: [id number] DOB: November 25, 1930 Sex: male 11/19/2023 Pre-operative Diagnosis: Atherosclerosis native arteries with right lower extremity heel ulceration Post-operative diagnosis:  Same Surgeon:  Ace Holder C. Vikki Graves, MD Procedure Performed: 1.  Percutaneous ultrasound-guided cannulation left common femoral artery 2.  Catheter aorta and aortogram with bilateral lower extremity angiography 3.  Catheter selection right common femoral artery with right lower extremity angiogram 4.  Moderate sedation with fentanyl  and Versed  for 25 minutes Indications: 89 year old male with a history of aortic stenting now with right heel  ulceration and severely depressed toe pressure of 19.  He has known SFA stenosis and occlusion of his posterior tibial artery with two-vessel runoff via the anterior tibial and peroneal arteries and is now indicated for angiography with possible invention. Findings: Aorta and the bilateral common iliac arteries were heavily calcified although there did not appear to be any flow-limiting stenosis.  The right extrailiac artery appears free of flow-limiting stenosis as of the left.  Bilateral common femorals are heavily diseased with significant calcifications in the right side.  The left side is subtotally occluded.  Both profunda femoris and SFAs are initially patent.  On the right side which is the site of interest there is a long segment stenosis followed by subtotal occlusion at adductor canal with subsequent occlusion at the knee and reconstitutes below the knee what appears to be a healthy right popliteal artery  and anterior tibial artery runoff is dominant but he occludes the distal dorsalis pedis and also has peroneal artery runoff to the ankle. Plan will be for discussion of right common femoral endarterectomy PTFE bypass to the below-knee popliteal artery for limb salvage.  Procedure:  The patient was identified in the holding area and taken to room 8.  The patient was then placed supine on the table and prepped and draped in the usual sterile fashion.  A time out was called.  Ultrasound was used to evaluate the left common femoral artery.  This was heavily calcified and diseased.  The area around the groin was anesthetized with 1% lidocaine  and concomitantly we administered fentanyl  and Versed  as moderate sedation his vital signs were monitored throughout the case.  We able to use ultrasound to cannulate 1 small area that appeared healthy with a micropuncture needle followed by wire sheath and ultrasound image was saved for the record.  A Bentson wire was placed and we are able to track this into the aorta under fluoroscopic guidance and then placed a 5 French sheath and Omni catheter was placed to the level of L1 and aortogram was performed.  I then performed pelvic angiography by pulling down to just above the stent which appeared patent although there was heavy calcification around the stent and around the common iliac arteries bilaterally.  I then used a crossover catheter and Glidewire advanced across the aortic bifurcation into the right common femoral artery and perform right lower extremity angiography.  Given the multilevel disease including significant calcifications of the common femoral artery and occlusion of the SFA I have elected for discussion of common femoral endarterectomy with bypass to the below-knee popliteal artery likely with PTFE.  Patient is unwilling to discuss surgery at this time he will be scheduled to see me in the office for further discussion.  Catheter and wire were removed and sheath  will be pulled in postoperative holding.  He did tolerate the procedure without any complication. Contrast: 58cc Brandon C. Vikki Graves, MD [facility] [place] Office: [phone] Pager: [phone]   VAS US  ABI WITH/WO TBI Result Date: 11/07/2023  LOWER EXTREMITY DOPPLER STUDY Patient Name:  Nathan Santiago [facility]  Date of Exam:   11/07/2023 Medical Rec #: [id number]          Accession #:    [id number] Date of Birth: 1930-09-24          Patient Gender: M Patient Age:   62 years Exam Location:  [facility] Procedure:      VAS US  ABI WITH/WO TBI Referring Phys: Angela Kell --------------------------------------------------------------------------------  Indications: Rest pain, ulceration, and peripheral artery disease. High Risk Factors: Hypertension, hyperlipidemia, past history of smoking.  Limitations: Today's exam was limited due to patient in wheelchair. Comparison Study: 09/29/21                   Right: 0.87                   Left: 0.65 Performing Technologist: Jenifer Miu RVT  Examination Guidelines: A complete evaluation includes at minimum, Doppler waveform signals and systolic blood pressure reading at the level of bilateral brachial, anterior tibial, and posterior tibial arteries, when vessel segments are accessible. Bilateral testing is considered an integral part of a complete examination. Photoelectric Plethysmograph (PPG) waveforms and toe systolic pressure readings are included as required and additional duplex testing as needed. Limited examinations for reoccurring indications may be performed as noted.  ABI Findings: +---------+------------------+-----+----------+ Right    Rt Pressure (mmHg)IndexWaveform   +---------+------------------+-----+----------+ Brachial 151                               +---------+------------------+-----+----------+ PTA      255               1.67 monophasic +---------+------------------+-----+----------+ DP       49                 0.32 biphasic   +---------+------------------+-----+----------+ Great Toe19                0.12 Abnormal   +---------+------------------+-----+----------+ +---------+------------------+-----+----------+ Left     Lt Pressure (mmHg)IndexWaveform   +---------+------------------+-----+----------+ Brachial 153                               +---------+------------------+-----+----------+ PTA      46                0.30 monophasic +---------+------------------+-----+----------+ DP       57                0.37 monophasic +---------+------------------+-----+----------+ Great Toe112               0.73 Normal     +---------+------------------+-----+----------+ +-------+-----------+-----------+------------+------------+ ABI/TBIToday's ABIToday's TBIPrevious ABIPrevious TBI +-------+-----------+-----------+------------+------------+ Right  Gary         0.12       0.87        0.50         +-------+-----------+-----------+------------+------------+ Left   0.37       0.73       0.65        0.50         +-------+-----------+-----------+------------+------------+  Summary: Right: Resting right ankle-brachial index indicates noncompressible right lower extremity arteries. The right toe-brachial index is abnormal. Left: Resting left ankle-brachial index indicates severe left lower extremity arterial disease. The left toe-brachial index is normal. Arterial wall calcification precludes accurate ankle pressures and ABIs.  *See table(s) above for measurements and observations.  Electronically signed by Angela Kell MD on 11/07/2023 at 5:10:21 PM.    Final     Assessment/Plan 1. Peripheral arterial disease (HCC) (Primary) S/P Aortogram Legs does not have any signs of infection or concerns  2. Essential (primary) hypertension BP Controlled on Number of Meds Norvasc ,Clonidine , Chlorthalidone  Hydralazine   and Cozaar   3. Mixed hyperlipidemia On statin  4. PAF (paroxysmal atrial  fibrillation) (HCC) Eliquis  and Metoprolol   5. Chronic diastolic (  congestive) heart failure (HCC) Lasix  PRn  6. Stage 3b chronic kidney disease (HCC) Creat stable    Labs/tests ordered:   Next appt:  Visit date not found

## 2023-12-12 ENCOUNTER — Encounter: Payer: Self-pay | Admitting: Orthopedic Surgery

## 2023-12-12 ENCOUNTER — Non-Acute Institutional Stay (SKILLED_NURSING_FACILITY): Admitting: Orthopedic Surgery

## 2023-12-12 DIAGNOSIS — I10 Essential (primary) hypertension: Secondary | ICD-10-CM | POA: Diagnosis not present

## 2023-12-12 DIAGNOSIS — I5033 Acute on chronic diastolic (congestive) heart failure: Secondary | ICD-10-CM

## 2023-12-12 MED ORDER — CLONIDINE HCL 0.1 MG PO TABS: 0.1000 mg | ORAL_TABLET | Freq: Three times a day (TID) | ORAL | Status: DC

## 2023-12-12 MED ORDER — FUROSEMIDE 20 MG PO TABS: 20.0000 mg | ORAL_TABLET | Freq: Every day | ORAL | Status: DC

## 2023-12-12 NOTE — Progress Notes (Signed)
 Location:  [facility] Room Number: 149/A Place of Service:  SNF [phone]) Provider: Curvin Downing    Patient Care Team: Marguerite Shiley, MD as PCP - General (Internal Medicine) Avanell Leigh, MD as PCP - Cardiology (Cardiology)  Extended Emergency Contact Information Primary Emergency Contact: Delcia Feeling Address: [address]          [place], [place] [address] [place] Home Phone: [phone] Mobile Phone: [phone] Relation: Spouse  Code Status:  DNR Goals of care: Advanced Directive information    12/12/2023    2:19 PM  Advanced Directives  Does Patient Have a Medical Advance Directive? Yes  Type of Estate agent of [place];Living will;Out of facility DNR (pink MOST or yellow form)  Does patient want to make changes to medical advance directive? No - Patient declined  Copy of Healthcare Power of Attorney in Chart? Yes - validated most recent copy scanned in chart (See row information)  Pre-existing out of facility DNR order (yellow form or pink MOST form) Pink MOST/Yellow Form most recent copy in chart - Physician notified to receive inpatient order     Chief Complaint  Patient presents with   Acute Visit    Weight gain    HPI:  Pt is a 89 y.o. male seen today for acute visit due to weight gain.   He currently resides on the skilled nursing unit at [facility]. PMH: CAD with stents, PAD, renal artery stenosis, HTN, AAA, HLD, GERD, fall with right hip fracture s/p RATH 06/2022, and unstable gait.   Recent weight gain > 5 lbs. He remains on furosemide  20 mg daily prn for weight gain > 3 lbs in 1 day or 5 lbs within one week. He did receive furosemide  05/13 and 05/14. Ankles appear more swollen. Admits to dry cough. Denies chest pain or shortness of breath. BNP 488.2 last hospitalization 05/25-03/03 due to AKI. See weight trends below. Afebrile. Vitals stable.   BUN/creat 36.5/1.19  10/08/2023.   Recent weights:  05/14- 190.2 lbs  05/13- 191.8 lbs  05/12- 189.2 lbs  05/11- 190.8 bs  05/10- 188.6 lbs  05/09- 188.4 lbs  05/08- 185.6 bs    Past Medical History:  Diagnosis Date   AAA (abdominal aortic aneurysm) (HCC)    asymptomatic   AAA (abdominal aortic aneurysm) (HCC) 2010   peripheral  angiogram-- bilateral SFA DISEASE  and 60 to 70% infrarenaal abd. aortic stenosis with 15 -mm gradient   Adenomatous colon polyp 11/2003   CAD (coronary artery disease)    Gait abnormality 02/13/2017   Gastroparesis    pt denies   GERD (gastroesophageal reflux disease)    w/ LPR   History of kidney stones    x2   Hyperlipidemia    Hypertension    IDA (iron  deficiency anemia)    Peripheral arterial disease (HCC)    RCEA  by Dr Paulette Borrow   PUD (peptic ulcer disease)    pt unaware   Vertigo    Past Surgical History:  Procedure Laterality Date   ABDOMINAL AORTOGRAM N/A 11/19/2023   Procedure: ABDOMINAL AORTOGRAM;  Surgeon: Adine Hoof, MD;  Location: [facility] INVASIVE CV LAB;  Service: Cardiovascular;  Laterality: N/A;   ABDOMINAL AORTOGRAM W/LOWER EXTREMITY Bilateral 08/05/2018   Procedure: ABDOMINAL AORTOGRAM W/LOWER EXTREMITY;  Surgeon: Avanell Leigh, MD;  Location: [facility] INVASIVE CV LAB;  Service: Cardiovascular;  Laterality: Bilateral;   ABDOMINAL AORTOGRAM W/LOWER EXTREMITY N/A 09/26/2021   Procedure:  ABDOMINAL AORTOGRAM W/LOWER EXTREMITY;  Surgeon: Avanell Leigh, MD;  Location: [facility] INVASIVE CV LAB;  Service: Cardiovascular;  Laterality: N/A;   AORTOGRAM  04/24/2016    Abdominal aortogram, bilateral iliac angiogram, bifemoral runoff   CARDIAC CATHETERIZATION  05/12/2005   RCA   carotid doppler  01/24/2013   RICA endarterectomy,left CCA 0-49%; left bulb and prox ICA 50-69%; bilaateral subclavian < 50%   CAROTID ENDARTERECTOMY  11/01/2010   CATARACT EXTRACTION     COLONOSCOPY     CORONARY ANGIOPLASTY  05/18/2005   2 STENTS distal RCA AND  PROXIMAL-MID RCA   5 total stents per pt.   CYSTOSCOPY/URETEROSCOPY/HOLMIUM LASER/STENT PLACEMENT Left 01/11/2018   Procedure: LEFT URETEROSCOPY/HOLMIUM LASER/STENT PLACEMENT;  Surgeon: Andrez Banker, MD;  Location: [facility] ORS;  Service: Urology;  Laterality: Left;   DOPPLER ECHOCARDIOGRAPHY  02/07/2012   EF 55%,SHOWED NO ISCHEMIA    HERNIA REPAIR     umbilical   lower arterial  doppler  02/04/2013   aotra 1.5 x 1.5 cm; distal abd aorta 70-99%,proximal common iliac arteries -very stenotic with increased velocities>50%,may be falsely elevated as a result of residual plaque from the distal aorta stenosis   LOWER EXTREMITY ANGIOGRAPHY N/A 11/19/2023   Procedure: Lower Extremity Angiography;  Surgeon: Adine Hoof, MD;  Location: [facility] INVASIVE CV LAB;  Service: Cardiovascular;  Laterality: N/A;   lower extremity doppler  June 18 ,2013   ABI'S ABNORMAL, RABI was 0.88 and LABI 0.75 ,with 3-vessel  run off   NM MYOVIEW  LTD  MAY 23,2011   showed no significant ischemia;   NM MYOVIEW  LTD  04/22/2008   ef 77%,exercise capcity 6 METS ,exaggerated blood pressure response to exercise   PERIPHERAL VASCULAR CATHETERIZATION N/A 04/24/2016   Procedure: Lower Extremity Angiography;  Surgeon: Avanell Leigh, MD;  Location: [facility] INVASIVE CV LAB;  Service: Cardiovascular;  Laterality: N/A;   PERIPHERAL VASCULAR CATHETERIZATION  04/24/2016   Procedure: Peripheral Vascular Intervention;  Surgeon: Avanell Leigh, MD;  Location: [facility] INVASIVE CV LAB;  Service: Cardiovascular;;  Aorta   retrograde central aortic catheterization  05/19/2005   cutting balloon atherectomy, c-circ stenosis with DES STENTING CYPHER   TONSILLECTOMY     TOTAL HIP ARTHROPLASTY Right 07/11/2022   Procedure: TOTAL HIP ARTHROPLASTY ANTERIOR APPROACH;  Surgeon: Claiborne Crew, MD;  Location: [facility] ORS;  Service: Orthopedics;  Laterality: Right;   TOTAL KNEE ARTHROPLASTY Left 01/03/2019   Procedure: TOTAL KNEE ARTHROPLASTY;  Surgeon: Winston Hawking, MD;  Location: [facility] ORS;  Service: Orthopedics;  Laterality: Left;  with IS block   TRANSURETHRAL RESECTION OF PROSTATE N/A 09/08/2016   Procedure: TRANSURETHRAL RESECTION OF THE PROSTATE (TURP);  Surgeon: Andrez Banker, MD;  Location: [facility];  Service: Urology;  Laterality: N/A;   TRANSURETHRAL RESECTION OF PROSTATE     10-10-17  Dr. Dulcy Gibney   TRANSURETHRAL RESECTION OF PROSTATE N/A 10/10/2017   Procedure: TRANSURETHRAL RESECTION OF THE PROSTATE (TURP);  Surgeon: Andrez Banker, MD;  Location: [facility] ORS;  Service: Urology;  Laterality: N/A;   VASECTOMY      Allergies  Allergen Reactions   Lisinopril Cough   Niaspan [Niacin Er (Antihyperlipidemic)] Rash    Outpatient Encounter Medications as of 12/12/2023  Medication Sig   acetaminophen  (TYLENOL ) 500 MG tablet Take 1,000 mg by mouth 2 (two) times daily.   amLODipine  (NORVASC ) 10 MG tablet Take 10 mg by mouth in the morning.   apixaban  (ELIQUIS ) 5 MG TABS tablet Take 1 tablet (5 mg total) by mouth  2 (two) times daily.   Apoaequorin (PREVAGEN PO) Take 1 tablet by mouth in the morning.   atorvastatin  (LIPITOR) 40 MG tablet Take 40 mg by mouth daily at 6 PM.    Biotin  10 MG TABS Take 10 mg by mouth in the morning.   cetirizine  (ZYRTEC ) 10 MG tablet Take 1 tablet (10 mg total) by mouth at bedtime.   chlorthalidone  (HYGROTON ) 50 MG tablet Take 50 mg by mouth in the morning. Hold if BP < 95   cholecalciferol  (VITAMIN D3) 25 MCG (1000 UT) tablet Take 1,000 Units by mouth in the morning.   cloNIDine  (CATAPRES ) 0.1 MG tablet Take 1 tablet (0.1 mg total) by mouth 3 (three) times daily. (Patient taking differently: Take 0.1 mg by mouth 3 (three) times daily. Hold if BP < 95)   finasteride  (PROSCAR ) 5 MG tablet Take 5 mg by mouth in the morning.   furosemide  (LASIX ) 20 MG tablet Take 20 mg by mouth daily as needed.   gabapentin  (NEURONTIN ) 100 MG capsule Take 200 mg by mouth 3 (three) times daily.   Glucosamine-Chondroitin 750-600 MG TABS  Take 1 tablet by mouth 2 (two) times daily.   hydrALAZINE  (APRESOLINE ) 25 MG tablet Take 25 mg by mouth in the morning and at bedtime. Hold if BP < 95   hydrALAZINE  (APRESOLINE ) 50 MG tablet Take 50 mg by mouth daily at 4 PM. Hold if BP <95   iron  polysaccharides (NIFEREX) 150 MG capsule Take 150 mg by mouth every Monday, Wednesday, and Friday.   losartan  (COZAAR ) 50 MG tablet Take 50 mg by mouth 2 (two) times daily.   melatonin 5 MG TABS Take 1 tablet (5 mg total) by mouth at bedtime.   metoprolol  tartrate (LOPRESSOR ) 50 MG tablet Take 75 mg by mouth 2 (two) times daily.   Multiple Vitamin (MULTIVITAMIN WITH MINERALS) TABS tablet Take 1 tablet by mouth in the morning.   pantoprazole  (PROTONIX ) 40 MG tablet Take 40 mg by mouth 2 (two) times daily.   potassium chloride  SA (K-DUR,KLOR-CON ) 20 MEQ tablet Take 20 mEq by mouth every evening.    tamsulosin  (FLOMAX ) 0.4 MG CAPS capsule Take 1 capsule (0.4 mg total) by mouth daily after supper.   No facility-administered encounter medications on file as of 12/12/2023.    Review of Systems  Constitutional:  Negative for fatigue and fever.  HENT:  Negative for congestion and sore throat.   Respiratory:  Positive for cough. Negative for shortness of breath and wheezing.   Cardiovascular:  Positive for leg swelling. Negative for chest pain.  Gastrointestinal:  Negative for abdominal distention.  Genitourinary:  Negative for hematuria.  Musculoskeletal:  Positive for gait problem.  Skin:  Negative for wound.  Neurological:  Positive for weakness. Negative for dizziness and light-headedness.  Psychiatric/Behavioral:  Negative for dysphoric mood. The patient is not nervous/anxious.     Immunization History  Administered Date(s) Administered   Fluad Quad(high Dose 65+) 05/02/2022, 05/22/2023   Influenza, High Dose Seasonal PF 03/31/2017, 03/29/2018   Influenza,inj,Quad PF,6+ Mos 04/25/2016, 05/06/2018   Influenza,inj,quad, With Preservative  05/26/2019   Influenza-Unspecified 05/14/2001, 05/31/2001, 05/14/2002, 05/15/2002, 05/18/2003, 05/25/2004, 04/30/2005, 05/29/2006, 05/01/2007, 05/11/2008, 05/31/2009, 05/31/2010, 04/30/2013, 05/31/2014, 03/31/2021   Moderna Covid-19 Vaccine Bivalent Booster 92yrs & up 08/31/2021, 05/22/2023   Moderna SARS-COV2 Booster Vaccination 06/15/2020, 11/19/2020, 01/02/2022   Moderna Sars-Covid-2 Vaccination 08/12/2019, 09/10/2019, 05/31/2022   Pfizer Covid-19 Vaccine Bivalent Booster 29yrs & up 11/23/2022   Pneumococcal Polysaccharide-23 05/02/2000, 09/24/2018   Rsv, Bivalent, Protein Subunit  Rsvpref,pf (Abrysvo) 07/19/2022   Td 09/08/2013   Zoster Recombinant(Shingrix) 10/04/2017, 01/23/2018   Pertinent  Health Maintenance Due  Topic Date Due   INFLUENZA VACCINE  02/29/2024      07/13/2022    7:10 AM 07/28/2022    9:00 AM 10/14/2022    7:21 AM 11/28/2022    2:50 PM 06/04/2023    4:59 PM  Fall Risk  Falls in the past year?   1 1 0  Was there an injury with Fall?   1 0 0  Fall Risk Category Calculator   3 1 0  (RETIRED) Patient Fall Risk Level High fall risk High fall risk     Patient at Risk for Falls Due to    History of fall(s);Impaired balance/gait;Impaired mobility No Fall Risks  Fall risk Follow up   Falls evaluation completed Falls evaluation completed;Education provided;Falls prevention discussed Falls evaluation completed   Functional Status Survey:    Vitals:   12/12/23 1416  BP: (!) 162/72  Pulse: (!) 58  Resp: 18  Temp: (!) 97.3 F (36.3 C)  SpO2: 92%  Weight: 190 lb 3.2 oz (86.3 kg)  Height: 5\' 4"  (1.626 m)   Body mass index is 32.65 kg/m. Physical Exam Vitals reviewed.  Constitutional:      General: He is not in acute distress. HENT:     Head: Normocephalic.  Eyes:     General:        Right eye: No discharge.        Left eye: No discharge.  Cardiovascular:     Rate and Rhythm: Normal rate and regular rhythm.     Pulses: Normal pulses.     Heart sounds:  Normal heart sounds.  Pulmonary:     Effort: Pulmonary effort is normal. No respiratory distress.     Breath sounds: Examination of the right-upper field reveals rales. Examination of the left-upper field reveals rales. Rales present. No wheezing.  Abdominal:     General: Bowel sounds are normal.     Palpations: Abdomen is soft.  Musculoskeletal:     Cervical back: Neck supple.     Right lower leg: Edema present.     Left lower leg: Edema present.     Comments: 1+ pitting  Skin:    General: Skin is warm.     Capillary Refill: Capillary refill takes less than 2 seconds.  Neurological:     General: No focal deficit present.     Mental Status: He is alert.     Motor: Weakness present.     Gait: Gait abnormal.  Psychiatric:        Mood and Affect: Mood normal.     Labs reviewed: Recent Labs    09/28/23 0236 09/29/23 0752 09/30/23 0231 10/01/23 0234 11/19/23 1128  NA 142 144 142 140 144  K 3.7 3.7 3.4* 3.6 4.7  CL 106 108 107 106 108  CO2 26 25 24 24   --   GLUCOSE 120* 127* 111* 102* 136*  BUN 49* 40* 37* 32* 29*  CREATININE 1.99* 1.39* 1.37* 1.16 1.20  CALCIUM  8.7* 9.2 8.8* 8.8*  --   MG 2.1  --   --  2.2  --    Recent Labs    06/18/23 0000  AST 16  ALT 15  ALKPHOS 91  ALBUMIN 3.9   Recent Labs    09/25/23 2212 09/27/23 0228 09/29/23 0752 09/30/23 0231 11/19/23 1128  WBC 7.4 9.3 9.4 7.1  --   NEUTROABS  5.6  --   --   --   --   HGB 11.7* 10.3* 11.1* 10.0* 12.2*  HCT 36.7* 31.0* 33.3* 30.9* 36.0*  MCV 91.5 88.3 88.6 90.4  --   PLT 181 187 185 184  --    Lab Results  Component Value Date   TSH 6.02 (A) 06/18/2023   Lab Results  Component Value Date   HGBA1C 5.5 11/23/2016   Lab Results  Component Value Date   CHOL 133 10/17/2022   HDL 49 10/17/2022   LDLCALC 65 10/17/2022   TRIG 122 10/17/2022   CHOLHDL 2.4 11/23/2016    Significant Diagnostic Results in last 30 days:  PERIPHERAL VASCULAR CATHETERIZATION Result Date: 11/19/2023 Images from  the original result were not included. Patient name: Nathan Santiago MRN: [id number] DOB: [birth year] Sex: male 11/19/2023 Pre-operative Diagnosis: Atherosclerosis native arteries with right lower extremity heel ulceration Post-operative diagnosis:  Same Surgeon:  Ace Holder C. Vikki Graves, MD Procedure Performed: 1.  Percutaneous ultrasound-guided cannulation left common femoral artery 2.  Catheter aorta and aortogram with bilateral lower extremity angiography 3.  Catheter selection right common femoral artery with right lower extremity angiogram 4.  Moderate sedation with fentanyl  and Versed  for 25 minutes Indications: 89 year old male with a history of aortic stenting now with right heel ulceration and severely depressed toe pressure of 19.  He has known SFA stenosis and occlusion of his posterior tibial artery with two-vessel runoff via the anterior tibial and peroneal arteries and is now indicated for angiography with possible invention. Findings: Aorta and the bilateral common iliac arteries were heavily calcified although there did not appear to be any flow-limiting stenosis.  The right extrailiac artery appears free of flow-limiting stenosis as of the left.  Bilateral common femorals are heavily diseased with significant calcifications in the right side.  The left side is subtotally occluded.  Both profunda femoris and SFAs are initially patent.  On the right side which is the site of interest there is a long segment stenosis followed by subtotal occlusion at adductor canal with subsequent occlusion at the knee and reconstitutes below the knee what appears to be a healthy right popliteal artery and anterior tibial artery runoff is dominant but he occludes the distal dorsalis pedis and also has peroneal artery runoff to the ankle. Plan will be for discussion of right common femoral endarterectomy PTFE bypass to the below-knee popliteal artery for limb salvage.  Procedure:  The patient was identified in the holding area  and taken to room 8.  The patient was then placed supine on the table and prepped and draped in the usual sterile fashion.  A time out was called.  Ultrasound was used to evaluate the left common femoral artery.  This was heavily calcified and diseased.  The area around the groin was anesthetized with 1% lidocaine  and concomitantly we administered fentanyl  and Versed  as moderate sedation his vital signs were monitored throughout the case.  We able to use ultrasound to cannulate 1 small area that appeared healthy with a micropuncture needle followed by wire sheath and ultrasound image was saved for the record.  A Bentson wire was placed and we are able to track this into the aorta under fluoroscopic guidance and then placed a 5 French sheath and Omni catheter was placed to the level of L1 and aortogram was performed.  I then performed pelvic angiography by pulling down to just above the stent which appeared patent although there was heavy calcification around the stent and  around the common iliac arteries bilaterally.  I then used a crossover catheter and Glidewire advanced across the aortic bifurcation into the right common femoral artery and perform right lower extremity angiography.  Given the multilevel disease including significant calcifications of the common femoral artery and occlusion of the SFA I have elected for discussion of common femoral endarterectomy with bypass to the below-knee popliteal artery likely with PTFE.  Patient is unwilling to discuss surgery at this time he will be scheduled to see me in the office for further discussion.  Catheter and wire were removed and sheath will be pulled in postoperative holding.  He did tolerate the procedure without any complication. Contrast: 58cc Brandon C. Vikki Graves, MD [facility] Office: [phone] Pager: [phone]    Assessment/Plan 1. Acute on chronic diastolic heart failure (HCC) (Primary) - LVEF 60-65% 09/14/2023 -  weight gain > 5 lbs, rales, 1+ edema, dry cough - furosemide  20 mg was given 05/13 and 05/14 - also on chlorthalidone  - will continue furosemide  20 mg daily 05/15, 05/16, 05/17 - give extra furosemide  20 mg once now - cont KCL - cont daily weights - bmp 05/20 - if cough worsens, recommend CXR - consider scheduled furosemide  3x/week if exacerbations continue     Family/ staff Communication: plan discussed with patient and nurse  Labs/tests ordered:  bmp 05/20

## 2023-12-18 DIAGNOSIS — I130 Hypertensive heart and chronic kidney disease with heart failure and stage 1 through stage 4 chronic kidney disease, or unspecified chronic kidney disease: Secondary | ICD-10-CM | POA: Diagnosis not present

## 2023-12-18 LAB — BASIC METABOLIC PANEL WITH GFR
BUN: 41 — AB (ref 4–21)
CO2: 23 — AB (ref 13–22)
Chloride: 105 (ref 99–108)
Creatinine: 1.6 — AB (ref 0.6–1.3)
Glucose: 139
Potassium: 4.5 meq/L (ref 3.5–5.1)

## 2023-12-18 LAB — COMPREHENSIVE METABOLIC PANEL WITH GFR: Calcium: 8.9 (ref 8.7–10.7)

## 2023-12-19 ENCOUNTER — Other Ambulatory Visit: Payer: Self-pay | Admitting: *Deleted

## 2023-12-19 DIAGNOSIS — I739 Peripheral vascular disease, unspecified: Secondary | ICD-10-CM

## 2023-12-19 DIAGNOSIS — I7025 Atherosclerosis of native arteries of other extremities with ulceration: Secondary | ICD-10-CM

## 2023-12-26 ENCOUNTER — Non-Acute Institutional Stay (SKILLED_NURSING_FACILITY): Payer: Self-pay | Admitting: Internal Medicine

## 2023-12-26 ENCOUNTER — Encounter: Payer: Self-pay | Admitting: Internal Medicine

## 2023-12-26 DIAGNOSIS — I739 Peripheral vascular disease, unspecified: Secondary | ICD-10-CM

## 2023-12-26 DIAGNOSIS — I480 Paroxysmal atrial fibrillation: Secondary | ICD-10-CM | POA: Diagnosis not present

## 2023-12-26 DIAGNOSIS — I10 Essential (primary) hypertension: Secondary | ICD-10-CM

## 2023-12-26 DIAGNOSIS — N1832 Chronic kidney disease, stage 3b: Secondary | ICD-10-CM

## 2023-12-26 DIAGNOSIS — I5032 Chronic diastolic (congestive) heart failure: Secondary | ICD-10-CM

## 2023-12-26 NOTE — Progress Notes (Unsigned)
 Provider:  Merrell Santiago Location:   [facility]  Nursing Home Room Number: 149 A Place of Service:  SNF ([id number])  PCP: Nathan Shiley, MD Patient Care Team: Nathan Shiley, MD as PCP - General (Internal Medicine) Nathan Leigh, MD as PCP - Cardiology (Cardiology)  Extended Emergency Contact Information Primary Emergency Contact: Nathan Santiago Address: [address]          [place], [place] [address] [place] Home Phone: [phone] Mobile Phone: [phone] Relation: Spouse  Code Status: DNR Goals of Care: Advanced Directive information    12/26/2023    2:55 PM  Advanced Directives  Does Patient Have a Medical Advance Directive? Yes  Type of Estate agent of [place];Living will;Out of facility DNR (pink MOST or yellow form)  Does patient want to make changes to medical advance directive? No - Patient declined  Copy of Healthcare Power of Attorney in Chart? Yes - validated most recent copy scanned in chart (See row information)      Chief Complaint  Patient presents with   Edema    HPI: Patient is a 89 y.o. male seen today for admission to  Past Medical History:  Diagnosis Date   AAA (abdominal aortic aneurysm) (HCC)    asymptomatic   AAA (abdominal aortic aneurysm) (HCC) 2010   peripheral  angiogram-- bilateral SFA DISEASE  and 60 to 70% infrarenaal abd. aortic stenosis with 15 -mm gradient   Adenomatous colon polyp 11/2003   CAD (coronary artery disease)    Gait abnormality 02/13/2017   Gastroparesis    pt denies   GERD (gastroesophageal reflux disease)    w/ LPR   History of kidney stones    x2   Hyperlipidemia    Hypertension    IDA (iron  deficiency anemia)    Peripheral arterial disease (HCC)    RCEA  by Dr Paulette Borrow   PUD (peptic ulcer disease)    pt unaware   Vertigo    Past Surgical History:  Procedure Laterality Date   ABDOMINAL AORTOGRAM N/A 11/19/2023    Procedure: ABDOMINAL AORTOGRAM;  Surgeon: Adine Hoof, MD;  Location: [facility] INVASIVE CV LAB;  Service: Cardiovascular;  Laterality: N/A;   ABDOMINAL AORTOGRAM W/LOWER EXTREMITY Bilateral 08/05/2018   Procedure: ABDOMINAL AORTOGRAM W/LOWER EXTREMITY;  Surgeon: Nathan Leigh, MD;  Location: [facility] INVASIVE CV LAB;  Service: Cardiovascular;  Laterality: Bilateral;   ABDOMINAL AORTOGRAM W/LOWER EXTREMITY N/A 09/26/2021   Procedure: ABDOMINAL AORTOGRAM W/LOWER EXTREMITY;  Surgeon: Nathan Leigh, MD;  Location: [facility] INVASIVE CV LAB;  Service: Cardiovascular;  Laterality: N/A;   AORTOGRAM  04/24/2016    Abdominal aortogram, bilateral iliac angiogram, bifemoral runoff   CARDIAC CATHETERIZATION  05/12/2005   RCA   carotid doppler  01/24/2013   RICA endarterectomy,left CCA 0-49%; left bulb and prox ICA 50-69%; bilaateral subclavian < 50%   CAROTID ENDARTERECTOMY  11/01/2010   CATARACT EXTRACTION     COLONOSCOPY     CORONARY ANGIOPLASTY  05/18/2005   2 STENTS distal RCA AND PROXIMAL-MID RCA   5 total stents per pt.   CYSTOSCOPY/URETEROSCOPY/HOLMIUM LASER/STENT PLACEMENT Left 01/11/2018   Procedure: LEFT URETEROSCOPY/HOLMIUM LASER/STENT PLACEMENT;  Surgeon: Andrez Banker, MD;  Location: [facility] ORS;  Service: Urology;  Laterality: Left;   DOPPLER ECHOCARDIOGRAPHY  02/07/2012   EF 55%,SHOWED NO ISCHEMIA    HERNIA REPAIR     umbilical   lower arterial  doppler  02/04/2013   aotra 1.5 x 1.5 cm;  distal abd aorta 70-99%,proximal common iliac arteries -very stenotic with increased velocities>50%,may be falsely elevated as a result of residual plaque from the distal aorta stenosis   LOWER EXTREMITY ANGIOGRAPHY N/A 11/19/2023   Procedure: Lower Extremity Angiography;  Surgeon: Adine Hoof, MD;  Location: [facility] INVASIVE CV LAB;  Service: Cardiovascular;  Laterality: N/A;   lower extremity doppler  June 18 ,2013   ABI'S ABNORMAL, RABI was 0.88 and LABI 0.75 ,with 3-vessel  run off   NM MYOVIEW   LTD  MAY 23,2011   showed no significant ischemia;   NM MYOVIEW  LTD  04/22/2008   ef 77%,exercise capcity 6 METS ,exaggerated blood pressure response to exercise   PERIPHERAL VASCULAR CATHETERIZATION N/A 04/24/2016   Procedure: Lower Extremity Angiography;  Surgeon: Nathan Leigh, MD;  Location: [facility] INVASIVE CV LAB;  Service: Cardiovascular;  Laterality: N/A;   PERIPHERAL VASCULAR CATHETERIZATION  04/24/2016   Procedure: Peripheral Vascular Intervention;  Surgeon: Nathan Leigh, MD;  Location: [facility] INVASIVE CV LAB;  Service: Cardiovascular;;  Aorta   retrograde central aortic catheterization  05/19/2005   cutting balloon atherectomy, c-circ stenosis with DES STENTING CYPHER   TONSILLECTOMY     TOTAL HIP ARTHROPLASTY Right 07/11/2022   Procedure: TOTAL HIP ARTHROPLASTY ANTERIOR APPROACH;  Surgeon: Claiborne Crew, MD;  Location: [facility] ORS;  Service: Orthopedics;  Laterality: Right;   TOTAL KNEE ARTHROPLASTY Left 01/03/2019   Procedure: TOTAL KNEE ARTHROPLASTY;  Surgeon: Winston Hawking, MD;  Location: [facility] ORS;  Service: Orthopedics;  Laterality: Left;  with IS block   TRANSURETHRAL RESECTION OF PROSTATE N/A 09/08/2016   Procedure: TRANSURETHRAL RESECTION OF THE PROSTATE (TURP);  Surgeon: Andrez Banker, MD;  Location: [facility] ORS;  Service: Urology;  Laterality: N/A;   TRANSURETHRAL RESECTION OF PROSTATE     10-10-17  Dr. Dulcy Gibney   TRANSURETHRAL RESECTION OF PROSTATE N/A 10/10/2017   Procedure: TRANSURETHRAL RESECTION OF THE PROSTATE (TURP);  Surgeon: Andrez Banker, MD;  Location: [facility] ORS;  Service: Urology;  Laterality: N/A;   VASECTOMY      reports that he quit smoking about 55 years ago. His smoking use included cigarettes. He started smoking about 75 years ago. He has a 40 pack-year smoking history. He has never used smokeless tobacco. He reports that he does not currently use alcohol  after a past usage of about 4.0 standard drinks of alcohol  per week. He reports that he does not use drugs. Social  History   Socioeconomic History   Marital status: Married    Spouse name: Nathan Santiago   Number of children: 2   Years of education: BA   Highest education level: Not on file  Occupational History   Occupation: Retired  Tobacco Use   Smoking status: Former    Current packs/day: 0.00    Average packs/day: 2.0 packs/day for 20.0 years (40.0 ttl pk-yrs)    Types: Cigarettes    Start date: 07/31/1948    Quit date: 07/31/1968    Years since quitting: 55.4   Smokeless tobacco: Never  Vaping Use   Vaping status: Never Used  Substance and Sexual Activity   Alcohol  use: Not Currently    Alcohol /week: 4.0 standard drinks of alcohol     Types: 4 Glasses of wine per week   Drug use: Never   Sexual activity: Not Currently  Other Topics Concern   Not on file  Social History Narrative   Lives with wife   Caffeine use: Decaf coffee, tea   Left handed   Social Drivers of  Health   Financial Resource Strain: Low Risk  (11/28/2022)   Overall Financial Resource Strain (CARDIA)    Difficulty of Paying Living Expenses: Not hard at all  Food Insecurity: No Food Insecurity (09/26/2023)   Hunger Vital Sign    Worried About Running Out of Food in the Last Year: Never true    Ran Out of Food in the Last Year: Never true  Transportation Needs: No Transportation Needs (09/26/2023)   PRAPARE - Administrator, Civil Service (Medical): No    Lack of Transportation (Non-Medical): No  Physical Activity: Insufficiently Active (11/28/2022)   Exercise Vital Sign    Days of Exercise per Week: 3 days    Minutes of Exercise per Session: 30 min  Stress: No Stress Concern Present (11/28/2022)   [employer] of Occupational Health - Occupational Stress Questionnaire    Santiago of Stress : Not at all  Social Connections: Socially Integrated (09/26/2023)   Social Connection and Isolation Panel [NHANES]    Frequency of Communication with Friends and Family: More than three times a week    Frequency of Social  Gatherings with Friends and Family: More than three times a week    Attends Religious Services: More than 4 times per year    Active Member of Clubs or Organizations: Yes    Attends Banker Meetings: More than 4 times per year    Marital Status: Married  Catering manager Violence: Not At Risk (09/26/2023)   Humiliation, Afraid, Rape, and Kick questionnaire    Fear of Current or Ex-Partner: No    Emotionally Abused: No    Physically Abused: No    Sexually Abused: No    Functional Status Survey:    Family History  Problem Relation Age of Onset   Ovarian cancer Sister    Heart disease Father    Brain cancer Brother        Tumor    Health Maintenance  Topic Date Due   Pneumonia Vaccine 42+ Years old (3 of 3 - PCV) 09/25/2019   COVID-19 Vaccine (7 - 2024-25 season) 07/17/2023   DTaP/Tdap/Td (2 - Tdap) 09/09/2023   Medicare Annual Wellness (AWV)  11/28/2023   INFLUENZA VACCINE  02/29/2024   Zoster Vaccines- Shingrix  Completed   HPV VACCINES  Aged Out   Meningococcal B Vaccine  Aged Out    Allergies  Allergen Reactions   Lisinopril Cough   Niaspan [Niacin Er (Antihyperlipidemic)] Rash    Allergies as of 12/26/2023       Reactions   Lisinopril Cough   Niaspan [niacin Er (antihyperlipidemic)] Rash        Medication List        Accurate as of Dec 26, 2023  2:57 PM. If you have any questions, ask your nurse or doctor.          acetaminophen  500 MG tablet Commonly known as: TYLENOL  Take 1,000 mg by mouth 2 (two) times daily.   amLODipine  10 MG tablet Commonly known as: NORVASC  Take 10 mg by mouth in the morning. Hold if BP <95   apixaban  5 MG Tabs tablet Commonly known as: ELIQUIS  Take 1 tablet (5 mg total) by mouth 2 (two) times daily.   atorvastatin  40 MG tablet Commonly known as: LIPITOR Take 40 mg by mouth daily at 6 PM.   Biotin  10 MG Tabs Take 10 mg by mouth in the morning. Once a morning 8am-11am   cetirizine  10 MG  tablet Commonly known  as: ZYRTEC  Take 1 tablet (10 mg total) by mouth at bedtime.   chlorthalidone  50 MG tablet Commonly known as: HYGROTON  Take 50 mg by mouth in the morning. Hold if BP < 95   cholecalciferol  25 MCG (1000 UNIT) tablet Commonly known as: VITAMIN D3 Take 1,000 Units by mouth in the morning.   cloNIDine  0.1 MG tablet Commonly known as: Catapres  Take 1 tablet (0.1 mg total) by mouth 3 (three) times daily. Hold if BP < 95   finasteride  5 MG tablet Commonly known as: PROSCAR  Take 5 mg by mouth in the morning.   furosemide  20 MG tablet Commonly known as: LASIX  Take 20 mg by mouth daily as needed.   furosemide  20 MG tablet Commonly known as: LASIX  Take 1 tablet (20 mg total) by mouth daily for 3 days.   gabapentin  100 MG capsule Commonly known as: NEURONTIN  Take 200 mg by mouth 3 (three) times daily.   Glucosamine-Chondroitin 750-600 MG Tabs Take 1 tablet by mouth 2 (two) times daily.   hydrALAZINE  25 MG tablet Commonly known as: APRESOLINE  Take 25 mg by mouth in the morning and at bedtime. Hold if BP < 95   hydrALAZINE  50 MG tablet Commonly known as: APRESOLINE  Take 50 mg by mouth daily at 4 PM. Hold if BP <95   iron  polysaccharides 150 MG capsule Commonly known as: NIFEREX Take 150 mg by mouth every Monday, Wednesday, and Friday.   losartan  50 MG tablet Commonly known as: COZAAR  Take 50 mg by mouth 2 (two) times daily.   melatonin 5 MG Tabs Take 1 tablet (5 mg total) by mouth at bedtime.   metoprolol  tartrate 50 MG tablet Commonly known as: LOPRESSOR  Take 75 mg by mouth 2 (two) times daily.   multivitamin with minerals Tabs tablet Take 1 tablet by mouth in the morning.   pantoprazole  40 MG tablet Commonly known as: PROTONIX  Take 40 mg by mouth 2 (two) times daily.   potassium chloride  SA 20 MEQ tablet Commonly known as: KLOR-CON  M Take 20 mEq by mouth every evening.   PREVAGEN PO Take 1 tablet by mouth in the morning.   tamsulosin   0.4 MG Caps capsule Commonly known as: FLOMAX  Take 1 capsule (0.4 mg total) by mouth daily after supper.        Review of Systems  Vitals:   12/26/23 1431  BP: (!) 152/65  Pulse: 60  Resp: 14  Temp: 97.7 F (36.5 C)  SpO2: 94%  Weight: 192 lb 3.2 oz (87.2 kg)  Height: 5\' 4"  (1.626 m)   Body mass index is 32.99 kg/m. Physical Exam  Labs reviewed: Basic Metabolic Panel: Recent Labs    09/28/23 0236 09/29/23 0752 09/30/23 0231 10/01/23 0234 11/19/23 1128  NA 142 144 142 140 144  K 3.7 3.7 3.4* 3.6 4.7  CL 106 108 107 106 108  CO2 26 25 24 24   --   GLUCOSE 120* 127* 111* 102* 136*  BUN 49* 40* 37* 32* 29*  CREATININE 1.99* 1.39* 1.37* 1.16 1.20  CALCIUM  8.7* 9.2 8.8* 8.8*  --   MG 2.1  --   --  2.2  --    Liver Function Tests: Recent Labs    06/18/23 0000  AST 16  ALT 15  ALKPHOS 91  ALBUMIN 3.9   No results for input(s): "LIPASE", "AMYLASE" in the last 8760 hours. No results for input(s): "AMMONIA" in the last 8760 hours. CBC: Recent Labs    09/25/23 2212 09/27/23 0228 09/29/23  0865 09/30/23 0231 11/19/23 1128  WBC 7.4 9.3 9.4 7.1  --   NEUTROABS 5.6  --   --   --   --   HGB 11.7* 10.3* 11.1* 10.0* 12.2*  HCT 36.7* 31.0* 33.3* 30.9* 36.0*  MCV 91.5 88.3 88.6 90.4  --   PLT 181 187 185 184  --    Cardiac Enzymes: No results for input(s): "CKTOTAL", "CKMB", "CKMBINDEX", "TROPONINI" in the last 8760 hours. BNP: Invalid input(s): "POCBNP" Lab Results  Component Value Date   HGBA1C 5.5 11/23/2016   Lab Results  Component Value Date   TSH 6.02 (A) 06/18/2023   Lab Results  Component Value Date   VITAMINB12 571 05/13/2022   Lab Results  Component Value Date   FOLATE 32.5 05/13/2022   Lab Results  Component Value Date   IRON  63 05/13/2022   TIBC 323 05/13/2022   FERRITIN 23 (L) 05/13/2022    Imaging and Procedures obtained prior to SNF admission: PERIPHERAL VASCULAR CATHETERIZATION Result Date: 11/19/2023 Images from the original  result were not included. 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 Vikki Graves, MD Procedure Performed: 1.  Percutaneous ultrasound-guided cannulation left common femoral artery 2.  Catheter aorta and aortogram with bilateral lower extremity angiography 3.  Catheter selection right common femoral artery with right lower extremity angiogram 4.  Moderate sedation with fentanyl  and Versed  for 25 minutes Indications: 89 year old male with a history of aortic stenting now with right heel ulceration and severely depressed toe pressure of 19.  He has known SFA stenosis and occlusion of his posterior tibial artery with two-vessel runoff via the anterior tibial and peroneal arteries and is now indicated for angiography with possible invention. Findings: Aorta and the bilateral common iliac arteries were heavily calcified although there did not appear to be any flow-limiting stenosis.  The right extrailiac artery appears free of flow-limiting stenosis as of the left.  Bilateral common femorals are heavily diseased with significant calcifications in the right side.  The left side is subtotally occluded.  Both profunda femoris and SFAs are initially patent.  On the right side which is the site of interest there is a long segment stenosis followed by subtotal occlusion at adductor canal with subsequent occlusion at the knee and reconstitutes below the knee what appears to be a healthy right popliteal artery and anterior tibial artery runoff is dominant but he occludes the distal dorsalis pedis and also has peroneal artery runoff to the ankle. Plan will be for discussion of right common femoral endarterectomy PTFE bypass to the below-knee popliteal artery for limb salvage.  Procedure:  The patient was identified in the holding area and taken to  room 8.  The patient was then placed supine on the table and prepped and draped in the usual sterile fashion.  A time out was called.  Ultrasound was used to evaluate the left common femoral artery.  This was heavily calcified and diseased.  The area around the groin was anesthetized with 1% lidocaine  and concomitantly we administered fentanyl  and Versed  as moderate sedation his vital signs were monitored throughout the case.  We able to use ultrasound to cannulate 1 small area that appeared healthy with a micropuncture needle followed by wire sheath and ultrasound image was saved for the record.  A Bentson wire was placed and we are able to track this into  the aorta under fluoroscopic guidance and then placed a 5 French sheath and Omni catheter was placed to the level of L1 and aortogram was performed.  I then performed pelvic angiography by pulling down to just above the stent which appeared patent although there was heavy calcification around the stent and around the common iliac arteries bilaterally.  I then used a crossover catheter and Glidewire advanced across the aortic bifurcation into the right common femoral artery and perform right lower extremity angiography.  Given the multilevel disease including significant calcifications of the common femoral artery and occlusion of the SFA I have elected for discussion of common femoral endarterectomy with bypass to the below-knee popliteal artery likely with PTFE.  Patient is unwilling to discuss surgery at this time he will be scheduled to see me in the office for further discussion.  Catheter and wire were removed and sheath will be pulled in postoperative holding.  He did tolerate the procedure without any complication. Contrast: 58cc Brandon C. Vikki Graves, MD [facility] Office: [phone] Pager: [phone]    Assessment/Plan There are no diagnoses linked to this encounter.   Family/ staff Communication:   Labs/tests  ordered:

## 2023-12-27 MED ORDER — FUROSEMIDE 40 MG PO TABS: 40.0000 mg | ORAL_TABLET | Freq: Every day | ORAL | Status: DC

## 2023-12-31 DIAGNOSIS — I130 Hypertensive heart and chronic kidney disease with heart failure and stage 1 through stage 4 chronic kidney disease, or unspecified chronic kidney disease: Secondary | ICD-10-CM | POA: Diagnosis not present

## 2023-12-31 LAB — BASIC METABOLIC PANEL WITH GFR
BUN: 50 — AB (ref 4–21)
CO2: 21 (ref 13–22)
Chloride: 108 (ref 99–108)
Creatinine: 1.7 — AB (ref 0.6–1.3)
Glucose: 164
Potassium: 4.4 meq/L (ref 3.5–5.1)
Sodium: 143 (ref 137–147)

## 2023-12-31 LAB — COMPREHENSIVE METABOLIC PANEL WITH GFR
Calcium: 9 (ref 8.7–10.7)
eGFR: 39

## 2024-01-02 ENCOUNTER — Ambulatory Visit: Attending: Vascular Surgery | Admitting: Physician Assistant

## 2024-01-02 ENCOUNTER — Ambulatory Visit (HOSPITAL_COMMUNITY): Admission: RE | Admit: 2024-01-02 | Source: Ambulatory Visit

## 2024-01-02 VITALS — BP 149/68 | HR 62 | Temp 97.6°F | Ht 64.0 in | Wt 192.2 lb

## 2024-01-02 DIAGNOSIS — I739 Peripheral vascular disease, unspecified: Secondary | ICD-10-CM

## 2024-01-02 DIAGNOSIS — I7025 Atherosclerosis of native arteries of other extremities with ulceration: Secondary | ICD-10-CM

## 2024-01-02 NOTE — Progress Notes (Signed)
 Office Note   History of Present Illness   Nathan Santiago is a 89 y.o. (Jun 07, 1931) male who presents for follow-up.  He does have a remote history of distal aortic stenting.  He recently underwent diagnostic right lower extremity angiogram by Dr. Vikki Graves on 11/19/2023.  This was done for a right heel ulceration.  He had no endovascular options for revascularization.  Plans for today's follow-up was to discuss possible future right common femoral endarterectomy with femoral to below-knee popliteal artery bypass with PTFE.  He returns today for follow-up.  He says that he is doing okay.  He feels like his right heel ulceration has significantly improved.  He keeps a dry bandage on his heel.  He denies any drainage from his heel.  He denies any rest pain.  He says for the past couple of months he has been nonambulatory and mobilizes via motorized wheelchair.  He says that he is not sure why he is not walking.  He says that his facility has not attempted to help him walk in a few months.  Current Outpatient Medications  Medication Sig Dispense Refill   acetaminophen  (TYLENOL ) 500 MG tablet Take 1,000 mg by mouth 2 (two) times daily.     amLODipine  (NORVASC ) 10 MG tablet Take 10 mg by mouth in the morning. Hold if BP <95     apixaban  (ELIQUIS ) 5 MG TABS tablet Take 1 tablet (5 mg total) by mouth 2 (two) times daily.     Apoaequorin (PREVAGEN PO) Take 1 tablet by mouth in the morning.     atorvastatin  (LIPITOR) 40 MG tablet Take 40 mg by mouth daily at 6 PM.      Biotin  10 MG TABS Take 10 mg by mouth in the morning. Once a morning 8am-11am     cetirizine  (ZYRTEC ) 10 MG tablet Take 1 tablet (10 mg total) by mouth at bedtime. 30 tablet 11   chlorthalidone  (HYGROTON ) 50 MG tablet Take 50 mg by mouth in the morning. Hold if BP < 95     cholecalciferol  (VITAMIN D3) 25 MCG (1000 UT) tablet Take 1,000 Units by mouth in the morning.     cloNIDine  (CATAPRES ) 0.1 MG tablet Take 1 tablet (0.1 mg total) by  mouth 3 (three) times daily. Hold if BP < 95 (Patient taking differently: Take 0.1 mg by mouth 3 (three) times daily. Hold if BP < 95)     finasteride  (PROSCAR ) 5 MG tablet Take 5 mg by mouth in the morning.     furosemide  (LASIX ) 20 MG tablet Take 20 mg by mouth daily as needed.     furosemide  (LASIX ) 40 MG tablet Take 1 tablet (40 mg total) by mouth daily.     gabapentin  (NEURONTIN ) 100 MG capsule Take 200 mg by mouth 3 (three) times daily.     Glucosamine-Chondroitin 750-600 MG TABS Take 1 tablet by mouth 2 (two) times daily.     hydrALAZINE  (APRESOLINE ) 25 MG tablet Take 25 mg by mouth in the morning and at bedtime. Hold if BP < 95     hydrALAZINE  (APRESOLINE ) 50 MG tablet Take 50 mg by mouth daily at 4 PM. Hold if BP <95     iron  polysaccharides (NIFEREX) 150 MG capsule Take 150 mg by mouth every Monday, Wednesday, and Friday.     losartan  (COZAAR ) 50 MG tablet Take 50 mg by mouth 2 (two) times daily.     melatonin 5 MG TABS Take 1 tablet (5 mg total)  by mouth at bedtime. 30 tablet 0   metoprolol  tartrate (LOPRESSOR ) 50 MG tablet Take 75 mg by mouth 2 (two) times daily.     Multiple Vitamin (MULTIVITAMIN WITH MINERALS) TABS tablet Take 1 tablet by mouth in the morning.     pantoprazole  (PROTONIX ) 40 MG tablet Take 40 mg by mouth 2 (two) times daily.     potassium chloride  SA (K-DUR,KLOR-CON ) 20 MEQ tablet Take 20 mEq by mouth every evening.      tamsulosin  (FLOMAX ) 0.4 MG CAPS capsule Take 1 capsule (0.4 mg total) by mouth daily after supper. 30 capsule    No current facility-administered medications for this visit.    REVIEW OF SYSTEMS (negative unless checked):   Cardiac:  []  Chest pain or chest pressure? []  Shortness of breath upon activity? []  Shortness of breath when lying flat? []  Irregular heart rhythm?  Vascular:  []  Pain in calf, thigh, or hip brought on by walking? []  Pain in feet at night that wakes you up from your sleep? []  Blood clot in your veins? []  Leg  swelling?  Pulmonary:  []  Oxygen at home? []  Productive cough? []  Wheezing?  Neurologic:  []  Sudden weakness in arms or legs? []  Sudden numbness in arms or legs? []  Sudden onset of difficult speaking or slurred speech? []  Temporary loss of vision in one eye? []  Problems with dizziness?  Gastrointestinal:  []  Blood in stool? []  Vomited blood?  Genitourinary:  []  Burning when urinating? []  Blood in urine?  Psychiatric:  []  Major depression  Hematologic:  []  Bleeding problems? []  Problems with blood clotting?  Dermatologic:  []  Rashes or ulcers?  Constitutional:  []  Fever or chills?  Ear/Nose/Throat:  []  Change in hearing? []  Nose bleeds? []  Sore throat?  Musculoskeletal:  []  Back pain? []  Joint pain? []  Muscle pain?   Physical Examination    Vitals:   01/02/24 1316  BP: (!) 149/68  Pulse: 62  Temp: 97.6 F (36.4 C)  TempSrc: Temporal  SpO2: 94%  Weight: 192 lb 3.2 oz (87.2 kg)  Height: 5' 4 (1.626 m)   Body mass index is 32.99 kg/m.  General:  WDWN in NAD; vital signs documented above Gait: Not observed HENT: WNL, normocephalic Pulmonary: normal non-labored breathing  Cardiac: regular Abdomen: soft, NT, no masses Skin: without rashes Vascular Exam/Pulses: Nonpalpable pedal pulses Extremities: Very small, nearly healed right heel ulceration without signs of infection. Ulceration is about 1 cm in diameter Neurologic: A&O X 3;  No focal weakness or paresthesias are detected Psychiatric:  The pt has Normal affect.     Medical Decision Making   Nathan Santiago is a 89 y.o. male who presents for wound check  The patient recently underwent diagnostic right lower extremity angiogram for a right heel ulceration. He had no endovascular revascularization options. He was going to follow up today to discuss possible future right lower extremity bypass At today's visit he is doing well. His right heel ulceration is almost completely healed. It  measures about 1cm in diameter He denies any rest pain or further tissue loss. He is currently not ambulatory, likely because his facility has deemed him a fall risk.  On exam he has nonpalpable pedal pulses and a brisk right peroneal doppler signal At this time he does not appear to need revascularization since his ulceration is nearly healed. He can follow up with our office in 6 months with an ABI   Deneise Finlay PA-C [facility] Office:  [phone]  Clinic MD: Vikki Graves

## 2024-01-03 ENCOUNTER — Other Ambulatory Visit: Payer: Self-pay | Admitting: *Deleted

## 2024-01-03 DIAGNOSIS — I7025 Atherosclerosis of native arteries of other extremities with ulceration: Secondary | ICD-10-CM

## 2024-01-03 DIAGNOSIS — I739 Peripheral vascular disease, unspecified: Secondary | ICD-10-CM

## 2024-01-11 ENCOUNTER — Non-Acute Institutional Stay (SKILLED_NURSING_FACILITY): Admitting: Adult Health

## 2024-01-11 ENCOUNTER — Encounter: Payer: Self-pay | Admitting: Adult Health

## 2024-01-11 DIAGNOSIS — Z0000 Encounter for general adult medical examination without abnormal findings: Secondary | ICD-10-CM

## 2024-01-11 NOTE — Progress Notes (Signed)
 Subjective:   Nathan Santiago is a 89 y.o. male who presents for Medicare Annual/Subsequent preventive examination skilled care [facility]  Visit Complete: In person  Patient Medicare AWV questionnaire was completed by the patient on 01/11/24; I have confirmed that all information answered by patient is correct and no changes since this date.  Cardiac Risk Factors include: advanced age (>16men, >36 women);dyslipidemia;hypertension     Objective:    Today's Vitals   01/11/24 1115  BP: (!) 150/70  Pulse: 64  Weight: 189 lb 3.2 oz (85.8 kg)   Body mass index is 32.48 kg/m.     01/11/2024   11:18 AM 12/26/2023    2:55 PM 12/12/2023    2:19 PM 11/19/2023   11:24 AM 10/02/2023   11:27 AM 09/26/2023    1:00 PM 08/27/2023   10:31 AM  Advanced Directives  Does Patient Have a Medical Advance Directive? Yes Yes Yes Yes Yes Yes Yes  Type of Estate agent of [place];Living will;Out of facility DNR (pink MOST or yellow form) Healthcare Power of [place];Living will;Out of facility DNR (pink MOST or yellow form) Healthcare Power of [place];Living will;Out of facility DNR (pink MOST or yellow form) Living will;Healthcare Power of [employer] of [place];Living will Healthcare Power of [place];Living will Healthcare Power of [place];Living will;Out of facility DNR (pink MOST or yellow form)  Does patient want to make changes to medical advance directive?  No - Patient declined No - Patient declined  No - Patient declined No - Patient declined No - Patient declined  Copy of Healthcare Power of Attorney in Chart? Yes - validated most recent copy scanned in chart (See row information) Yes - validated most recent copy scanned in chart (See row information) Yes - validated most recent copy scanned in chart (See row information)  Yes - validated most recent copy scanned in chart (See row information) Yes - validated most recent copy scanned in chart  (See row information) Yes - validated most recent copy scanned in chart (See row information)  Pre-existing out of facility DNR order (yellow form or pink MOST form)   Pink MOST/Yellow Form most recent copy in chart - Physician notified to receive inpatient order        Current Medications (verified) Outpatient Encounter Medications as of 01/11/2024  Medication Sig   acetaminophen  (TYLENOL ) 500 MG tablet Take 1,000 mg by mouth 2 (two) times daily.   amLODipine  (NORVASC ) 10 MG tablet Take 10 mg by mouth in the morning. Hold if BP <95   apixaban  (ELIQUIS ) 5 MG TABS tablet Take 1 tablet (5 mg total) by mouth 2 (two) times daily.   Apoaequorin (PREVAGEN PO) Take 1 tablet by mouth in the morning.   atorvastatin  (LIPITOR) 40 MG tablet Take 40 mg by mouth daily at 6 PM.    Biotin  10 MG TABS Take 10 mg by mouth in the morning. Once a morning 8am-11am   cetirizine  (ZYRTEC ) 10 MG tablet Take 1 tablet (10 mg total) by mouth at bedtime.   chlorthalidone  (HYGROTON ) 50 MG tablet Take 50 mg by mouth in the morning. Hold if BP < 95   cholecalciferol  (VITAMIN D3) 25 MCG (1000 UT) tablet Take 1,000 Units by mouth in the morning.   cloNIDine  (CATAPRES ) 0.1 MG tablet Take 1 tablet (0.1 mg total) by mouth 3 (three) times daily. Hold if BP < 95 (Patient taking differently: Take 0.1 mg by mouth 3 (three) times daily. Hold if BP <  95)   finasteride  (PROSCAR ) 5 MG tablet Take 5 mg by mouth in the morning.   furosemide  (LASIX ) 20 MG tablet Take 20 mg by mouth daily as needed.   furosemide  (LASIX ) 40 MG tablet Take 1 tablet (40 mg total) by mouth daily.   gabapentin  (NEURONTIN ) 100 MG capsule Take 200 mg by mouth 3 (three) times daily.   Glucosamine-Chondroitin 750-600 MG TABS Take 1 tablet by mouth 2 (two) times daily.   hydrALAZINE  (APRESOLINE ) 25 MG tablet Take 25 mg by mouth in the morning and at bedtime. Hold if BP < 95   hydrALAZINE  (APRESOLINE ) 50 MG tablet Take 50 mg by mouth daily at 4 PM. Hold if BP <95   iron   polysaccharides (NIFEREX) 150 MG capsule Take 150 mg by mouth every Monday, Wednesday, and Friday.   losartan  (COZAAR ) 50 MG tablet Take 50 mg by mouth 2 (two) times daily.   melatonin 5 MG TABS Take 1 tablet (5 mg total) by mouth at bedtime.   metoprolol  tartrate (LOPRESSOR ) 50 MG tablet Take 75 mg by mouth 2 (two) times daily.   Multiple Vitamin (MULTIVITAMIN WITH MINERALS) TABS tablet Take 1 tablet by mouth in the morning.   pantoprazole  (PROTONIX ) 40 MG tablet Take 40 mg by mouth 2 (two) times daily.   potassium chloride  SA (K-DUR,KLOR-CON ) 20 MEQ tablet Take 20 mEq by mouth every evening.    tamsulosin  (FLOMAX ) 0.4 MG CAPS capsule Take 1 capsule (0.4 mg total) by mouth daily after supper.   No facility-administered encounter medications on file as of 01/11/2024.    Allergies (verified) Lisinopril and Niaspan [niacin er (antihyperlipidemic)]   History: Past Medical History:  Diagnosis Date   AAA (abdominal aortic aneurysm) (HCC)    asymptomatic   AAA (abdominal aortic aneurysm) (HCC) 2010   peripheral  angiogram-- bilateral SFA DISEASE  and 60 to 70% infrarenaal abd. aortic stenosis with 15 -mm gradient   Adenomatous colon polyp 11/2003   CAD (coronary artery disease)    Gait abnormality 02/13/2017   Gastroparesis    pt denies   GERD (gastroesophageal reflux disease)    w/ LPR   History of kidney stones    x2   Hyperlipidemia    Hypertension    IDA (iron  deficiency anemia)    Peripheral arterial disease (HCC)    RCEA  by Dr Paulette Borrow   PUD (peptic ulcer disease)    pt unaware   Vertigo    Past Surgical History:  Procedure Laterality Date   ABDOMINAL AORTOGRAM N/A 11/19/2023   Procedure: ABDOMINAL AORTOGRAM;  Surgeon: Adine Hoof, MD;  [facility] INVAS[facility] CV LAB;  Service: Cardiovascular;  Laterality: N/A;   ABDOMINAL AORTOGRAM W/LOWER EXTREMITY Bilateral 08/05/2018   Procedure: ABDOMINAL AORTOGRAM W/LOWER EXTREMITY;  Surgeon: Avanell Leigh, MD;   Location: [facility] INVASIVE CV LAB;  Service: Cardiovascular;  Laterality: Bilateral;   ABDOMINAL AORTOGRAM W/LOWER EXTREMITY N/A 09/26/2021   Procedure: ABDOMINAL AORTOGRAM W/LOWER EXTREMITY;  Surgeon: Avanell Leigh, MD;  Location: [facility] INVASIVE CV LAB;  Service: Cardiovascular;  Laterality: N/A;   AORTOGRAM  04/24/2016    Abdominal aortogram, bilateral iliac angiogram, bifemoral runoff   CARDIAC CATHETERIZATION  05/12/2005   RCA   carotid doppler  01/24/2013   RICA endarterectomy,left CCA 0-49%; left bulb and prox ICA 50-69%; bilaateral subclavian < 50%   CAROTID ENDARTERECTOMY  11/01/2010   CATARACT EXTRACTION     COLONOSCOPY     CORONARY ANGIOPLASTY  05/18/2005   2 STENTS distal  RCA AND PROXIMAL-MID RCA   5 total stents per pt.   CYSTOSCOPY/URETEROSCOPY/HOLMIUM LASER/STENT PLACEMENT Left 01/11/2018   Procedure: LEFT URETEROSCOPY/HOLMIUM LASER/STENT PLACEMENT;  Surgeon: Andrez Banker, MD;  Location: [facility] OR[facility];  Service: Urology;  Laterality: Left;   DOPPLER ECHOCARDIOGRAPHY  02/07/2012   EF 55%,SHOWED NO ISCHEMIA    HERNIA REPAIR     umbilical   lower arterial  doppler  02/04/2013   aotra 1.5 x 1.5 cm; distal abd aorta 70-99%,proximal common iliac arteries -very stenotic with increased velocities>50%,may be falsely elevated as a result of residual plaque from the distal aorta stenosis   LOWER EXTREMITY ANGIOGRAPHY N/A 11/19/2023   Procedure: Lower Extremity Angiography;  Surgeon: Adine Hoof, MD;  Location: [facility] CV LAB;  Service: Cardiovascular;  Laterality: N/A;   lower extremity doppler  June 18 ,2013   ABI'S ABNORMAL, RABI was 0.88 and LABI 0.75 ,with 3-vessel  run off   NM MYOVIEW  LTD  MAY 23,2011   showed no significant ischemia;   NM MYOVIEW  LTD  04/22/2008   ef 77%,exercise capcity 6 METS ,exaggerated blood pressure response to exercise   PERIPHERAL VASCULAR CATHETERIZATION N/A 04/24/2016   Procedure: Lower Extremity Angiography;  Surgeon: Avanell Leigh, MD;   Location: [facility] INVASIVE CV LAB;  Service: Cardiovascular;  Laterality: N/A;   PERIPHERAL VASCULAR CATHETERIZATION  04/24/2016   Procedure: Peripheral Vascular Intervention;  Surgeon: Avanell Leigh, MD;  Location: [facility] INVASIVE CV LAB;  Service: Cardiovascular;;  Aorta   retrograde central aortic catheterization  05/19/2005   cutting balloon atherectomy, c-circ stenosis with DES STENTING CYPHER   TONSILLECTOMY     TOTAL HIP ARTHROPLASTY Right 07/11/2022   Procedure: TOTAL HIP ARTHROPLASTY ANTERIOR APPROACH;  Surgeon: Claiborne Crew, MD;  Location: [facility];  Service: Orthopedics;  Laterality: Right;   TOTAL KNEE ARTHROPLASTY Left 01/03/2019   Procedure: TOTAL KNEE ARTHROPLASTY;  Surgeon: Winston Hawking, MD;  Location: [facility];  Service: Orthopedics;  Laterality: Left;  with IS block   TRANSURETHRAL RESECTION OF PROSTATE N/A 09/08/2016   Procedure: TRANSURETHRAL RESECTION OF THE PROSTATE (TURP);  Surgeon: Andrez Banker, MD;  Location: [facility];  Service: Urology;  Laterality: N/A;   TRANSURETHRAL RESECTION OF PROSTATE     10-10-17  Dr. Dulcy Gibney   TRANSURETHRAL RESECTION OF PROSTATE N/A 10/10/2017   Procedure: TRANSURETHRAL RESECTION OF THE PROSTATE (TURP);  Surgeon: Andrez Banker, MD;  Location: [facility];  Service: Urology;  Laterality: N/A;   VASECTOMY     Family History  Problem Relation Age of Onset   Ovarian cancer Sister    Heart disease Father    Brain cancer Brother        Tumor   Social History   Socioeconomic History   Marital status: Married    Spouse name: [name]   Number of children: 2   Years of education: BA   Highest education level: Not on file  Occupational History   Occupation: Retired  Tobacco Use   Smoking status: Former    Current packs/day: 0.00    Average packs/day: 2.0 packs/day for 20.0 years (40.0 ttl pk-yrs)    Types: Cigarettes    Start date: 07/31/1948    Quit date: 07/31/1968    Years since quitting: 55.4   Smokeless tobacco: Never  Vaping Use   Vaping  status: Never Used  Substance and Sexual Activity   Alcohol  use: Not Currently    Alcohol /week: 4.0 standard drinks of alcohol     Types: 4 Glasses of  wine per week   Drug use: Never   Sexual activity: Not Currently  Other Topics Concern   Not on file  Social History Narrative   Lives with wife   Caffeine use: Decaf coffee, tea   Left handed   Social Drivers of Health   Financial Resource Strain: Low Risk  (11/28/2022)   Overall Financial Resource Strain (CARDIA)    Difficulty of Paying Living Expenses: Not hard at all  Food Insecurity: No Food Insecurity (09/26/2023)   Hunger Vital Sign    Worried About Running Out of Food in the Last Year: Never true    Ran Out of Food in the Last Year: Never true  Transportation Needs: No Transportation Needs (09/26/2023)   PRAPARE - Administrator, Civil Service (Medical): No    Lack of Transportation (Non-Medical): No  Physical Activity: Insufficiently Active (11/28/2022)   Exercise Vital Sign    Days of Exercise per Week: 3 days    Minutes of Exercise per Session: 30 min  Stress: No Stress Concern Present (11/28/2022)   [employer] of Occupational Health - Occupational Stress Questionnaire    Feeling of Stress : Not at all  Social Connections: Socially Integrated (09/26/2023)   Social Connection and Isolation Panel    Frequency of Communication with Friends and Family: More than three times a week    Frequency of Social Gatherings with Friends and Family: More than three times a week    Attends Religious Services: More than 4 times per year    Active Member of [employer] or Organizations: Yes    Attends Engineer, structural: More than 4 times per year    Marital Status: Married    Tobacco Counseling Counseling given: Not Answered   Clinical Intake:  Pre-visit preparation completed: No        BMI - recorded: 32.4 Nutritional Status: BMI > 30  Obese Diabetes: No  How often do you need to have someone help  you when you read instructions, pamphlets, or other written materials from your doctor or pharmacy?: 3 - Sometimes What is the last grade level you completed in school?: college  Interpreter Needed?: No  Information entered by :: Craige Dixon Shatavia Santor NP   Activities of Daily Living    01/11/2024   11:17 AM 09/26/2023    1:00 PM  In your present state of health, do you have any difficulty performing the following activities:  Hearing? 0 0  Vision? 1 0  Difficulty concentrating or making decisions? 1 0  Walking or climbing stairs? 1   Dressing or bathing? 1   Doing errands, shopping? 1 1  Preparing Food and eating ? Y   Using the Toilet? Y   In the past six months, have you accidently leaked urine? N   Do you have problems with loss of bowel control? N   Managing your Medications? Y   Managing your Finances? Y   Housekeeping or managing your Housekeeping? Y     Patient Care Team: Marguerite Shiley, MD as PCP - General (Internal Medicine) Avanell Leigh, MD as PCP - Cardiology (Cardiology)  Indicate any recent Medical Services you may have received from other than [facility] providers in the past year (date may be approximate).     Assessment:   This is a routine wellness examination for [place].  Hearing/Vision screen No results found.   Goals Addressed             This Visit's Progress  Weight (lb) < 180 lb (81.6 kg)   189 lb 3.2 oz (85.8 kg)      Depression Screen    01/11/2024   11:19 AM 07/13/2023   10:11 AM 06/04/2023    4:59 PM 11/28/2022    2:50 PM  PHQ 2/9 Scores  PHQ - 2 Score 0 0 0 0    Fall Risk    01/11/2024   11:18 AM 06/04/2023    4:59 PM 11/28/2022    2:50 PM 10/14/2022    7:21 AM 11/12/2017    2:58 PM  Fall Risk   Falls in the past year? 1 0 1 1 No   Number falls in past yr: 0 0 0 1   Injury with Fall? 1 0 0 1   Risk for fall due to : History of fall(s) No Fall Risks History of fall(s);Impaired balance/gait;Impaired mobility  Impaired balance/gait    Follow up Falls evaluation completed Falls evaluation completed Falls evaluation completed;Education provided;Falls prevention discussed Falls evaluation completed      Data saved with a previous flowsheet row definition    MEDICARE RISK AT HOME: Medicare Risk at Home Any stairs in or around the home?: No If so, are there any without handrails?: No Home free of loose throw rugs in walkways, pet beds, electrical cords, etc?: Yes Adequate lighting in your home to reduce risk of falls?: Yes Life alert?: No Use of a cane, walker or w/c?: Yes Grab bars in the bathroom?: Yes Shower chair or bench in shower?: Yes Elevated toilet seat or a handicapped toilet?: Yes  TIMED UP AND GO:  Was the test performed?  No    Cognitive Function:    11/28/2022    2:41 PM  MMSE - Mini Mental State Exam  Orientation to time 4  Orientation to Place 5  Registration 3  Attention/ Calculation 5  Recall 3  Language- name 2 objects 1  Language- repeat 1  Language- follow 3 step command 3  Language- read & follow direction 1  Write a sentence 1  Copy design 1  Total score 28        01/11/2024   11:19 AM  6CIT Screen  What Year? 0 points  What month? 3 points  What time? 0 points  Count back from 20 4 points  Months in reverse 4 points  Repeat phrase 0 points  Total Score 11 points    Immunizations Immunization History  Administered Date(s) Administered   Fluad  Quad(high Dose 65+) 05/02/2022, 05/22/2023   Influenza, High Dose Seasonal PF 03/31/2017, 03/29/2018   Influenza,inj,Quad PF,6+ Mos 04/25/2016, 05/06/2018   Influenza,inj,quad, With Preservative 05/26/2019   Influenza-Unspecified 05/14/2001, 05/31/2001, 05/14/2002, 05/15/2002, 05/18/2003, 05/25/2004, 04/30/2005, 05/29/2006, 05/01/2007, 05/11/2008, 05/31/2009, 05/31/2010, 04/30/2013, 05/31/2014, 03/31/2021   Moderna Covid-19 Vaccine Bivalent Booster 17yrs & up 08/31/2021, 05/22/2023   Moderna SARS-COV2 Booster Vaccination  06/15/2020, 11/19/2020, 01/02/2022   Moderna Sars-Covid-2 Vaccination 08/12/2019, 09/10/2019, 05/31/2022   Pfizer Covid-19 Vaccine Bivalent Booster 78yrs & up 11/23/2022   Pneumococcal Polysaccharide-23 05/02/2000, 09/24/2018   Rsv, Bivalent, Protein Subunit Rsvpref,pf Pattricia Bores) 07/19/2022   Td 09/08/2013   Zoster Recombinant(Shingrix) 10/04/2017, 01/23/2018    TDAP status: Due, Education has been provided regarding the importance of this vaccine. Advised may receive this vaccine at local pharmacy or Health Dept. Aware to provide a copy of the vaccination record if obtained from local pharmacy or Health Dept. Verbalized acceptance and understanding.  Flu Vaccine status: Up to date  Pneumococcal vaccine status: Due, Education has  been provided regarding the importance of this vaccine. Advised may receive this vaccine at local pharmacy or Health Dept. Aware to provide a copy of the vaccination record if obtained from local pharmacy or Health Dept. Verbalized acceptance and understanding.  Covid-19 vaccine status: Completed vaccines  Qualifies for Shingles Vaccine? Yes   Zostavax completed No   Shingrix Completed?: Yes  Screening Tests Health Maintenance  Topic Date Due   Pneumococcal Vaccine: 50+ Years (3 of 3 - PCV) 09/25/2019   COVID-19 Vaccine (7 - 2024-25 season) 07/17/2023   DTaP/Tdap/Td (2 - Tdap) 09/09/2023   INFLUENZA VACCINE  02/29/2024   Medicare Annual Wellness (AWV)  01/10/2025   Zoster Vaccines- Shingrix  Completed   HPV VACCINES  Aged Out   Meningococcal B Vaccine  Aged Out    Health Maintenance  Health Maintenance Due  Topic Date Due   Pneumococcal Vaccine: 50+ Years (3 of 3 - PCV) 09/25/2019   COVID-19 Vaccine (7 - 2024-25 season) 07/17/2023   DTaP/Tdap/Td (2 - Tdap) 09/09/2023    Colorectal cancer screening: No longer required.   Lung Cancer Screening: (Low Dose CT Chest recommended if Age 62-80 years, 20 pack-year currently smoking OR have quit w/in  15years.) does not qualify.   Lung Cancer Screening Referral: NA  Additional Screening:  Hepatitis C Screening: does not qualify; Completed NA  Vision Screening: Recommended annual ophthalmology exams for early detection of glaucoma and other disorders of the eye. Is the patient up to date with their annual eye exam?  Yes  Who is the provider or what is the name of the office in which the patient attends annual eye exams? [facility] If pt is not established with a provider, would they like to be referred to a provider to establish care? No .   Dental Screening: Recommended annual dental exams for proper oral hygiene  Diabetic Foot Exam: NA  Community Resource Referral / Chronic Care Management: CRR required this visit?  No   CCM required this visit?  No     Plan:     I have personally reviewed and noted the following in the patient's chart:   Medical and social history Use of alcohol , tobacco or illicit drugs  Current medications and supplements including opioid prescriptions. Patient is not currently taking opioid prescriptions. Functional ability and status Nutritional status Physical activity Advanced directives List of other physicians Hospitalizations, surgeries, and ER visits in previous 12 months Vitals Screenings to include cognitive, depression, and falls Referrals and appointments  In addition, I have reviewed and discussed with patient certain preventive protocols, quality metrics, and best practice recommendations. A written personalized care plan for preventive services as well as general preventive health recommendations were provided to patient.     Raylene Calamity, NP   01/11/2024   After Visit Summary: [facility]  Nurse Notes: NA

## 2024-01-11 NOTE — Patient Instructions (Signed)
 Nathan Santiago , Thank you for taking time to come for your Medicare Wellness Visit. I appreciate your ongoing commitment to your health goals. Please review the following plan we discussed and let me know if I can assist you in the future.   Screening recommendations/referrals: Colonoscopy aged out Recommended yearly ophthalmology/optometry visit for glaucoma screening and checkup Recommended yearly dental visit for hygiene and checkup  Vaccinations: Influenza vaccine due annually in September/October Pneumococcal vaccine recommend prevnar 20 Tdap vaccine needs up dating  Shingles vaccine up to date    Advanced directives: reviewed   Conditions/risks identified: fall risk   Next appointment: 1 year  Preventive Care 88 Years and Older, Male Preventive care refers to lifestyle choices and visits with your health care provider that can promote health and wellness. What does preventive care include? A yearly physical exam. This is also called an annual well check. Dental exams once or twice a year. Routine eye exams. Ask your health care provider how often you should have your eyes checked. Personal lifestyle choices, including: Daily care of your teeth and gums. Regular physical activity. Eating a healthy diet. Avoiding tobacco and drug use. Limiting alcohol  use. Practicing safe sex. Taking low doses of aspirin  every day. Taking vitamin and mineral supplements as recommended by your health care provider. What happens during an annual well check? The services and screenings done by your health care provider during your annual well check will depend on your age, overall health, lifestyle risk factors, and family history of disease. Counseling  Your health care provider may ask you questions about your: Alcohol  use. Tobacco use. Drug use. Emotional well-being. Home and relationship well-being. Sexual activity. Eating habits. History of falls. Memory and ability to  understand (cognition). Work and work Astronomer. Screening  You may have the following tests or measurements: Height, weight, and BMI. Blood pressure. Lipid and cholesterol levels. These may be checked every 5 years, or more frequently if you are over 88 years old. Skin check. Lung cancer screening. You may have this screening every year starting at age 50 if you have a 30-pack-year history of smoking and currently smoke or have quit within the past 15 years. Fecal occult blood test (FOBT) of the stool. You may have this test every year starting at age 88. Flexible sigmoidoscopy or colonoscopy. You may have a sigmoidoscopy every 5 years or a colonoscopy every 10 years starting at age 88. Prostate cancer screening. Recommendations will vary depending on your family history and other risks. Hepatitis C blood test. Hepatitis B blood test. Sexually transmitted disease (STD) testing. Diabetes screening. This is done by checking your blood sugar (glucose) after you have not eaten for a while (fasting). You may have this done every 1-3 years. Abdominal aortic aneurysm (AAA) screening. You may need this if you are a current or former smoker. Osteoporosis. You may be screened starting at age 88 if you are at high risk. Talk with your health care provider about your test results, treatment options, and if necessary, the need for more tests. Vaccines  Your health care provider may recommend certain vaccines, such as: Influenza vaccine. This is recommended every year. Tetanus, diphtheria, and acellular pertussis (Tdap, Td) vaccine. You may need a Td booster every 10 years. Zoster vaccine. You may need this after age 66. Pneumococcal 13-valent conjugate (PCV13) vaccine. One dose is recommended after age 69. Pneumococcal polysaccharide (PPSV23) vaccine. One dose is recommended after age 30. Talk to your health care provider about which screenings  and vaccines you need and how often you need them. This  information is not intended to replace advice given to you by your health care provider. Make sure you discuss any questions you have with your health care provider. Document Released: 08/13/2015 Document Revised: 04/05/2016 Document Reviewed: 05/18/2015 Elsevier Interactive Patient Education  2017 [employer].  Fall Prevention in the Home Falls can cause injuries. They can happen to people of all ages. There are many things you can do to make your home safe and to help prevent falls. What can I do on the outside of my home? Regularly fix the edges of walkways and driveways and fix any cracks. Remove anything that might make you trip as you walk through a door, such as a raised step or threshold. Trim any bushes or trees on the path to your home. Use bright outdoor lighting. Clear any walking paths of anything that might make someone trip, such as rocks or tools. Regularly check to see if handrails are loose or broken. Make sure that both sides of any steps have handrails. Any raised decks and porches should have guardrails on the edges. Have any leaves, snow, or ice cleared regularly. Use sand or salt on walking paths during winter. Clean up any spills in your garage right away. This includes oil or grease spills. What can I do in the bathroom? Use night lights. Install grab bars by the toilet and in the tub and shower. Do not use towel bars as grab bars. Use non-skid mats or decals in the tub or shower. If you need to sit down in the shower, use a plastic, non-slip stool. Keep the floor dry. Clean up any water  that spills on the floor as soon as it happens. Remove soap buildup in the tub or shower regularly. Attach bath mats securely with double-sided non-slip rug tape. Do not have throw rugs and other things on the floor that can make you trip. What can I do in the bedroom? Use night lights. Make sure that you have a light by your bed that is easy to reach. Do not use any sheets or  blankets that are too big for your bed. They should not hang down onto the floor. Have a firm chair that has side arms. You can use this for support while you get dressed. Do not have throw rugs and other things on the floor that can make you trip. What can I do in the kitchen? Clean up any spills right away. Avoid walking on wet floors. Keep items that you use a lot in easy-to-reach places. If you need to reach something above you, use a strong step stool that has a grab bar. Keep electrical cords out of the way. Do not use floor polish or wax that makes floors slippery. If you must use wax, use non-skid floor wax. Do not have throw rugs and other things on the floor that can make you trip. What can I do with my stairs? Do not leave any items on the stairs. Make sure that there are handrails on both sides of the stairs and use them. Fix handrails that are broken or loose. Make sure that handrails are as long as the stairways. Check any carpeting to make sure that it is firmly attached to the stairs. Fix any carpet that is loose or worn. Avoid having throw rugs at the top or bottom of the stairs. If you do have throw rugs, attach them to the floor with carpet tape.  Make sure that you have a light switch at the top of the stairs and the bottom of the stairs. If you do not have them, ask someone to add them for you. What else can I do to help prevent falls? Wear shoes that: Do not have high heels. Have rubber bottoms. Are comfortable and fit you well. Are closed at the toe. Do not wear sandals. If you use a stepladder: Make sure that it is fully opened. Do not climb a closed stepladder. Make sure that both sides of the stepladder are locked into place. Ask someone to hold it for you, if possible. Clearly mark and make sure that you can see: Any grab bars or handrails. First and last steps. Where the edge of each step is. Use tools that help you move around (mobility aids) if they are  needed. These include: Canes. Walkers. Scooters. Crutches. Turn on the lights when you go into a dark area. Replace any light bulbs as soon as they burn out. Set up your furniture so you have a clear path. Avoid moving your furniture around. If any of your floors are uneven, fix them. If there are any pets around you, be aware of where they are. Review your medicines with your doctor. Some medicines can make you feel dizzy. This can increase your chance of falling. Ask your doctor what other things that you can do to help prevent falls. This information is not intended to replace advice given to you by your health care provider. Make sure you discuss any questions you have with your health care provider. Document Released: 05/13/2009 Document Revised: 12/23/2015 Document Reviewed: 08/21/2014 Elsevier Interactive Patient Education  2017 [employer].

## 2024-01-14 DIAGNOSIS — R2689 Other abnormalities of gait and mobility: Secondary | ICD-10-CM | POA: Diagnosis not present

## 2024-01-14 DIAGNOSIS — M6281 Muscle weakness (generalized): Secondary | ICD-10-CM | POA: Diagnosis not present

## 2024-01-14 DIAGNOSIS — M1712 Unilateral primary osteoarthritis, left knee: Secondary | ICD-10-CM | POA: Diagnosis not present

## 2024-01-15 DIAGNOSIS — R2689 Other abnormalities of gait and mobility: Secondary | ICD-10-CM | POA: Diagnosis not present

## 2024-01-15 DIAGNOSIS — M6281 Muscle weakness (generalized): Secondary | ICD-10-CM | POA: Diagnosis not present

## 2024-01-15 DIAGNOSIS — M1712 Unilateral primary osteoarthritis, left knee: Secondary | ICD-10-CM | POA: Diagnosis not present

## 2024-01-16 DIAGNOSIS — R2689 Other abnormalities of gait and mobility: Secondary | ICD-10-CM | POA: Diagnosis not present

## 2024-01-16 DIAGNOSIS — M6281 Muscle weakness (generalized): Secondary | ICD-10-CM | POA: Diagnosis not present

## 2024-01-16 DIAGNOSIS — M1712 Unilateral primary osteoarthritis, left knee: Secondary | ICD-10-CM | POA: Diagnosis not present

## 2024-01-17 DIAGNOSIS — M6281 Muscle weakness (generalized): Secondary | ICD-10-CM | POA: Diagnosis not present

## 2024-01-17 DIAGNOSIS — R2689 Other abnormalities of gait and mobility: Secondary | ICD-10-CM | POA: Diagnosis not present

## 2024-01-17 DIAGNOSIS — M1712 Unilateral primary osteoarthritis, left knee: Secondary | ICD-10-CM | POA: Diagnosis not present

## 2024-01-18 DIAGNOSIS — M6281 Muscle weakness (generalized): Secondary | ICD-10-CM | POA: Diagnosis not present

## 2024-01-18 DIAGNOSIS — M1712 Unilateral primary osteoarthritis, left knee: Secondary | ICD-10-CM | POA: Diagnosis not present

## 2024-01-18 DIAGNOSIS — R2689 Other abnormalities of gait and mobility: Secondary | ICD-10-CM | POA: Diagnosis not present

## 2024-01-21 DIAGNOSIS — M1712 Unilateral primary osteoarthritis, left knee: Secondary | ICD-10-CM | POA: Diagnosis not present

## 2024-01-21 DIAGNOSIS — M6281 Muscle weakness (generalized): Secondary | ICD-10-CM | POA: Diagnosis not present

## 2024-01-21 DIAGNOSIS — R2689 Other abnormalities of gait and mobility: Secondary | ICD-10-CM | POA: Diagnosis not present

## 2024-01-22 ENCOUNTER — Encounter: Payer: Self-pay | Admitting: Orthopedic Surgery

## 2024-01-22 ENCOUNTER — Non-Acute Institutional Stay (SKILLED_NURSING_FACILITY): Admitting: Orthopedic Surgery

## 2024-01-22 DIAGNOSIS — R278 Other lack of coordination: Secondary | ICD-10-CM | POA: Diagnosis not present

## 2024-01-22 DIAGNOSIS — I10 Essential (primary) hypertension: Secondary | ICD-10-CM | POA: Diagnosis not present

## 2024-01-22 DIAGNOSIS — E782 Mixed hyperlipidemia: Secondary | ICD-10-CM | POA: Diagnosis not present

## 2024-01-22 DIAGNOSIS — I480 Paroxysmal atrial fibrillation: Secondary | ICD-10-CM | POA: Diagnosis not present

## 2024-01-22 DIAGNOSIS — D509 Iron deficiency anemia, unspecified: Secondary | ICD-10-CM | POA: Diagnosis not present

## 2024-01-22 DIAGNOSIS — I5032 Chronic diastolic (congestive) heart failure: Secondary | ICD-10-CM | POA: Diagnosis not present

## 2024-01-22 DIAGNOSIS — N1832 Chronic kidney disease, stage 3b: Secondary | ICD-10-CM | POA: Diagnosis not present

## 2024-01-22 DIAGNOSIS — M1712 Unilateral primary osteoarthritis, left knee: Secondary | ICD-10-CM | POA: Diagnosis not present

## 2024-01-22 DIAGNOSIS — R2689 Other abnormalities of gait and mobility: Secondary | ICD-10-CM | POA: Diagnosis not present

## 2024-01-22 DIAGNOSIS — M6281 Muscle weakness (generalized): Secondary | ICD-10-CM | POA: Diagnosis not present

## 2024-01-22 DIAGNOSIS — R400 Somnolence: Secondary | ICD-10-CM

## 2024-01-22 DIAGNOSIS — R296 Repeated falls: Secondary | ICD-10-CM | POA: Diagnosis not present

## 2024-01-22 LAB — BASIC METABOLIC PANEL WITH GFR
BUN: 63 — AB (ref 4–21)
CO2: 26 — AB (ref 13–22)
Chloride: 104 (ref 99–108)
Creatinine: 2.3 — AB (ref 0.6–1.3)
Glucose: 232
Potassium: 3.9 meq/L (ref 3.5–5.1)
Sodium: 141 (ref 137–147)

## 2024-01-22 LAB — COMPREHENSIVE METABOLIC PANEL WITH GFR
Albumin: 3.9 (ref 3.5–5.0)
Calcium: 9.1 (ref 8.7–10.7)
eGFR: 26

## 2024-01-22 LAB — HEPATIC FUNCTION PANEL
ALT: 16 U/L (ref 10–40)
AST: 14 (ref 14–40)
Alkaline Phosphatase: 96 (ref 25–125)
Bilirubin, Total: 0.4

## 2024-01-22 LAB — CBC AND DIFFERENTIAL
HCT: 36 — AB (ref 41–53)
Hemoglobin: 12.4 — AB (ref 13.5–17.5)
Platelets: 211 10*3/uL (ref 150–400)
WBC: 9.1

## 2024-01-22 LAB — CBC: RBC: 4.07 (ref 3.87–5.11)

## 2024-01-22 NOTE — Progress Notes (Signed)
 Location:  [facility] Room Number: 149/A Place of Service:  SNF [phone]) Provider:  Greig FORBES Cluster, NP   Charlanne Fredia CROME, MD  Patient Care Team: Charlanne Fredia CROME, MD as PCP - General (Internal Medicine) Court Dorn PARAS, MD as PCP - Cardiology (Cardiology)  Extended Emergency Contact Information Primary Emergency Contact: Christyne Madeline LABOR Address: [address]          [place], [place] [address] [place] Home Phone: [phone] Mobile Phone: [phone] Relation: Spouse  Code Status:  DNR Goals of care: Advanced Directive information    01/11/2024   11:18 AM  Advanced Directives  Does Patient Have a Medical Advance Directive? Yes  Type of Estate agent of [place];Living will;Out of facility DNR (pink MOST or yellow form)  Copy of Healthcare Power of Attorney in Chart? Yes - validated most recent copy scanned in chart (See row information)     Chief Complaint  Patient presents with   Medical Management of Chronic Issues    HPI:  Pt is a 89 y.o. male seen today for medical management of chronic diseases.    He currently resides on the skilled nursing unit at [facility]. PMH: CAD with stents, PAD, renal artery stenosis, HTN, AAA, HLD, GERD, fall with right hip fracture s/p RATH 06/2022, and unstable gait.   Drowsiness- onset x 3 days, he has run into wall/objects with PWC, falling asleep during encounter, unclear if past OSA per chart review CHF- LVEF 60-65% 09/14/2023, K 4.4 12/31/2023, BNP 488.2 09/25/2023, 05/28 furosemide  40 mg daily, see weights below HTN- BUN/creat 50/1.7 12/31/2023, remains on amlodipine , chlorthalidone , clonidine , hydralazine , metoprolol  and losartan  HLD- total cholesterol 133, LDL 65 09/2022, remains on atorvastatin  PAF- TSH 6.02 06/2023, remains on Eliquis  and metoprolol  CKD 3b- GFR 29 (06/02)> was 42 (05/20) Anemia- hgb 10.0 09/30/2023, remains on niferex  No recent  falls or injuries.   Recent weights:  06/24- 183.6 lbs  06/23- 184.8 lbs  06/21- 188.4 lbs  Recent blood pressures:  06/24- 133/70  06/22- 154/70  06/21- 131/57    Past Medical History:  Diagnosis Date   AAA (abdominal aortic aneurysm) (HCC)    asymptomatic   AAA (abdominal aortic aneurysm) (HCC) 2010   peripheral  angiogram-- bilateral SFA DISEASE  and 60 to 70% infrarenaal abd. aortic stenosis with 15 -mm gradient   Adenomatous colon polyp 11/2003   CAD (coronary artery disease)    Gait abnormality 02/13/2017   Gastroparesis    pt denies   GERD (gastroesophageal reflux disease)    w/ LPR   History of kidney stones    x2   Hyperlipidemia    Hypertension    IDA (iron  deficiency anemia)    Peripheral arterial disease (HCC)    RCEA  by Dr Medford Brunswick   PUD (peptic ulcer disease)    pt unaware   Vertigo    Past Surgical History:  Procedure Laterality Date   ABDOMINAL AORTOGRAM N/A 11/19/2023   Procedure: ABDOMINAL AORTOGRAM;  Surgeon: Sheree Penne Bruckner, MD;  Location: [facility] INVASIVE CV LAB;  Service: Cardiovascular;  Laterality: N/A;   ABDOMINAL AORTOGRAM W/LOWER EXTREMITY Bilateral 08/05/2018   Procedure: ABDOMINAL AORTOGRAM W/LOWER EXTREMITY;  Surgeon: Court Dorn PARAS, MD;  Location: [facility] INVASIVE CV LAB;  Service: Cardiovascular;  Laterality: Bilateral;   ABDOMINAL AORTOGRAM W/LOWER EXTREMITY N/A 09/26/2021   Procedure: ABDOMINAL AORTOGRAM W/LOWER EXTREMITY;  Surgeon: Court Dorn PARAS, MD;  Location: [facility] INVASIVE CV LAB;  Service: Cardiovascular;  Laterality: N/A;   AORTOGRAM  04/24/2016    Abdominal aortogram, bilateral iliac angiogram, bifemoral runoff   CARDIAC CATHETERIZATION  05/12/2005   RCA   carotid doppler  01/24/2013   RICA endarterectomy,left CCA 0-49%; left bulb and prox ICA 50-69%; bilaateral subclavian < 50%   CAROTID ENDARTERECTOMY  11/01/2010   CATARACT EXTRACTION     COLONOSCOPY     CORONARY ANGIOPLASTY  05/18/2005   2 STENTS distal RCA AND  PROXIMAL-MID RCA   5 total stents per pt.   CYSTOSCOPY/URETEROSCOPY/HOLMIUM LASER/STENT PLACEMENT Left 01/11/2018   Procedure: LEFT URETEROSCOPY/HOLMIUM LASER/STENT PLACEMENT;  Surgeon: Cam Morene ORN, MD;  Location: [facility] ORS;  Service: Urology;  Laterality: Left;   DOPPLER ECHOCARDIOGRAPHY  02/07/2012   EF 55%,SHOWED NO ISCHEMIA    HERNIA REPAIR     umbilical   lower arterial  doppler  02/04/2013   aotra 1.5 x 1.5 cm; distal abd aorta 70-99%,proximal common iliac arteries -very stenotic with increased velocities>50%,may be falsely elevated as a result of residual plaque from the distal aorta stenosis   LOWER EXTREMITY ANGIOGRAPHY N/A 11/19/2023   Procedure: Lower Extremity Angiography;  Surgeon: Sheree Penne Bruckner, MD;  Location: [facility] INVASIVE CV LAB;  Service: Cardiovascular;  Laterality: N/A;   lower extremity doppler  June 18 ,2013   ABI'S ABNORMAL, RABI was 0.88 and LABI 0.75 ,with 3-vessel  run off   NM MYOVIEW  LTD  MAY 23,2011   showed no significant ischemia;   NM MYOVIEW  LTD  04/22/2008   ef 77%,exercise capcity 6 METS ,exaggerated blood pressure response to exercise   PERIPHERAL VASCULAR CATHETERIZATION N/A 04/24/2016   Procedure: Lower Extremity Angiography;  Surgeon: Dorn JINNY Lesches, MD;  Location: [facility] INVASIVE CV LAB;  Service: Cardiovascular;  Laterality: N/A;   PERIPHERAL VASCULAR CATHETERIZATION  04/24/2016   Procedure: Peripheral Vascular Intervention;  Surgeon: Dorn JINNY Lesches, MD;  Location: [facility] INVASIVE CV LAB;  Service: Cardiovascular;;  Aorta   retrograde central aortic catheterization  05/19/2005   cutting balloon atherectomy, c-circ stenosis with DES STENTING CYPHER   TONSILLECTOMY     TOTAL HIP ARTHROPLASTY Right 07/11/2022   Procedure: TOTAL HIP ARTHROPLASTY ANTERIOR APPROACH;  Surgeon: Ernie Cough, MD;  Location: [facility] ORS;  Service: Orthopedics;  Laterality: Right;   TOTAL KNEE ARTHROPLASTY Left 01/03/2019   Procedure: TOTAL KNEE ARTHROPLASTY;  Surgeon: Kay Kemps, MD;  Location: [facility] ORS;  Service: Orthopedics;  Laterality: Left;  with IS block   TRANSURETHRAL RESECTION OF PROSTATE N/A 09/08/2016   Procedure: TRANSURETHRAL RESECTION OF THE PROSTATE (TURP);  Surgeon: Morene ORN Cam, MD;  Location: [facility] ORS;  Service: Urology;  Laterality: N/A;   TRANSURETHRAL RESECTION OF PROSTATE     10-10-17  Dr. Cam   TRANSURETHRAL RESECTION OF PROSTATE N/A 10/10/2017   Procedure: TRANSURETHRAL RESECTION OF THE PROSTATE (TURP);  Surgeon: Cam Morene ORN, MD;  Location: [facility] ORS;  Service: Urology;  Laterality: N/A;   VASECTOMY      Allergies  Allergen Reactions   Lisinopril Cough   Niaspan [Niacin Er (Antihyperlipidemic)] Rash    Outpatient Encounter Medications as of 01/22/2024  Medication Sig   acetaminophen  (TYLENOL ) 500 MG tablet Take 1,000 mg by mouth 2 (two) times daily.   amLODipine  (NORVASC ) 10 MG tablet Take 10 mg by mouth in the morning. Hold if BP <95   apixaban  (ELIQUIS ) 5 MG TABS tablet Take 1 tablet (5 mg total) by mouth 2 (two) times daily.   Apoaequorin (PREVAGEN PO) Take 1 tablet by mouth in the  morning.   atorvastatin  (LIPITOR) 40 MG tablet Take 40 mg by mouth daily at 6 PM.    Biotin  10 MG TABS Take 10 mg by mouth in the morning. Once a morning 8am-11am   cetirizine  (ZYRTEC ) 10 MG tablet Take 1 tablet (10 mg total) by mouth at bedtime.   chlorthalidone  (HYGROTON ) 50 MG tablet Take 50 mg by mouth in the morning. Hold if BP < 95   cholecalciferol  (VITAMIN D3) 25 MCG (1000 UT) tablet Take 1,000 Units by mouth in the morning.   cloNIDine  (CATAPRES ) 0.1 MG tablet Take 1 tablet (0.1 mg total) by mouth 3 (three) times daily. Hold if BP < 95 (Patient taking differently: Take 0.1 mg by mouth 3 (three) times daily. Hold if BP < 95)   finasteride  (PROSCAR ) 5 MG tablet Take 5 mg by mouth in the morning.   furosemide  (LASIX ) 20 MG tablet Take 20 mg by mouth daily as needed.   furosemide  (LASIX ) 40 MG tablet Take 1 tablet (40 mg total) by mouth  daily.   gabapentin  (NEURONTIN ) 100 MG capsule Take 200 mg by mouth 3 (three) times daily.   Glucosamine-Chondroitin 750-600 MG TABS Take 1 tablet by mouth 2 (two) times daily.   hydrALAZINE  (APRESOLINE ) 25 MG tablet Take 25 mg by mouth in the morning and at bedtime. Hold if BP < 95   hydrALAZINE  (APRESOLINE ) 50 MG tablet Take 50 mg by mouth daily at 4 PM. Hold if BP <95   iron  polysaccharides (NIFEREX) 150 MG capsule Take 150 mg by mouth every Monday, Wednesday, and Friday.   losartan  (COZAAR ) 50 MG tablet Take 50 mg by mouth 2 (two) times daily.   melatonin 5 MG TABS Take 1 tablet (5 mg total) by mouth at bedtime.   metoprolol  tartrate (LOPRESSOR ) 50 MG tablet Take 75 mg by mouth 2 (two) times daily.   Multiple Vitamin (MULTIVITAMIN WITH MINERALS) TABS tablet Take 1 tablet by mouth in the morning.   pantoprazole  (PROTONIX ) 40 MG tablet Take 40 mg by mouth 2 (two) times daily.   potassium chloride  SA (K-DUR,KLOR-CON ) 20 MEQ tablet Take 20 mEq by mouth every evening.    tamsulosin  (FLOMAX ) 0.4 MG CAPS capsule Take 1 capsule (0.4 mg total) by mouth daily after supper.   No facility-administered encounter medications on file as of 01/22/2024.    Review of Systems  Constitutional:  Positive for fatigue. Negative for fever.  HENT:  Negative for sore throat and trouble swallowing.   Eyes:  Negative for visual disturbance.  Respiratory:  Negative for cough and shortness of breath.   Cardiovascular:  Negative for chest pain and leg swelling.  Gastrointestinal:  Negative for abdominal distention and abdominal pain.  Genitourinary:  Negative for dysuria and hematuria.  Musculoskeletal:  Positive for gait problem.  Skin:  Negative for wound.  Neurological:  Positive for weakness. Negative for dizziness and headaches.  Psychiatric/Behavioral:  Negative for confusion, dysphoric mood and sleep disturbance. The patient is not nervous/anxious.     Immunization History  Administered Date(s)  Administered   Fluad  Quad(high Dose 65+) 05/02/2022, 05/22/2023   Influenza, High Dose Seasonal PF 03/31/2017, 03/29/2018   Influenza,inj,Quad PF,6+ Mos 04/25/2016, 05/06/2018   Influenza,inj,quad, With Preservative 05/26/2019   Influenza-Unspecified 05/14/2001, 05/31/2001, 05/14/2002, 05/15/2002, 05/18/2003, 05/25/2004, 04/30/2005, 05/29/2006, 05/01/2007, 05/11/2008, 05/31/2009, 05/31/2010, 04/30/2013, 05/31/2014, 03/31/2021   Moderna Covid-19 Vaccine Bivalent Booster 51yrs & up 08/31/2021, 05/22/2023   Moderna SARS-COV2 Booster Vaccination 06/15/2020, 11/19/2020, 01/02/2022   Moderna Sars-Covid-2 Vaccination 08/12/2019, 09/10/2019, 05/31/2022  Pfizer Covid-19 Vaccine Bivalent Booster 66yrs & up 11/23/2022   Pneumococcal Polysaccharide-23 05/02/2000, 09/24/2018   Rsv, Bivalent, Protein Subunit Rsvpref,pf [place]) 07/19/2022   Td 09/08/2013   Zoster Recombinant(Shingrix) 10/04/2017, 01/23/2018   Pertinent  Health Maintenance Due  Topic Date Due   INFLUENZA VACCINE  02/29/2024      07/28/2022    9:00 AM 10/14/2022    7:21 AM 11/28/2022    2:50 PM 06/04/2023    4:59 PM 01/11/2024   11:18 AM  Fall Risk  Falls in the past year?  1 1 0 1  Was there an injury with Fall?  1 0 0 1  Fall Risk Category Calculator  3 1 0 2  (RETIRED) Patient Fall Risk Level High fall risk       Patient at Risk for Falls Due to   History of fall(s);Impaired balance/gait;Impaired mobility No Fall Risks History of fall(s)  Fall risk Follow up  Falls evaluation completed Falls evaluation completed;Education provided;Falls prevention discussed Falls evaluation completed Falls evaluation completed     Data saved with a previous flowsheet row definition   Functional Status Survey:    Vitals:   01/22/24 1306  BP: 133/70  Pulse: (!) 59  Resp: 16  Temp: (!) 97.2 F (36.2 C)  SpO2: 93%  Weight: 183 lb 9.6 oz (83.3 kg)  Height: 5' 4 (1.626 m)   Body mass index is 31.51 kg/m. Physical Exam Vitals  reviewed.  Constitutional:      General: He is not in acute distress. HENT:     Head: Normocephalic and atraumatic.     Right Ear: Tympanic membrane normal.     Left Ear: Tympanic membrane normal.     Nose: Nose normal.     Mouth/Throat:     Mouth: Mucous membranes are moist.   Eyes:     General:        Right eye: No discharge.        Left eye: No discharge.     Pupils: Pupils are equal, round, and reactive to light.    Cardiovascular:     Rate and Rhythm: Normal rate and regular rhythm.     Pulses: Normal pulses.     Heart sounds: Normal heart sounds.  Pulmonary:     Effort: Pulmonary effort is normal.     Breath sounds: Examination of the right-middle field reveals decreased breath sounds. Examination of the left-middle field reveals decreased breath sounds. Decreased breath sounds present.  Abdominal:     General: There is no distension.     Palpations: Abdomen is soft.     Tenderness: There is no abdominal tenderness.   Musculoskeletal:     Cervical back: Neck supple.     Right lower leg: No edema.     Left lower leg: No edema.   Skin:    General: Skin is warm.     Capillary Refill: Capillary refill takes less than 2 seconds.   Neurological:     General: No focal deficit present.     Mental Status: He is easily aroused. Mental status is at baseline.     Motor: Weakness present.     Gait: Gait abnormal.     Comments: PWC  Psychiatric:        Mood and Affect: Mood normal.     Labs reviewed: Recent Labs    09/28/23 0236 09/29/23 0752 09/30/23 0231 10/01/23 0234 11/19/23 1128 12/18/23 0000 12/31/23 0000  NA 142   < > 142  140 144  --  143  K 3.7   < > 3.4* 3.6 4.7 4.5 4.4  CL 106   < > 107 106 108 105 108  CO2 26   < > 24 24  --  23* 21  GLUCOSE 120*   < > 111* 102* 136*  --   --   BUN 49*   < > 37* 32* 29* 41* 50*  CREATININE 1.99*   < > 1.37* 1.16 1.20 1.6* 1.7*  CALCIUM  8.7*   < > 8.8* 8.8*  --  8.9 9.0  MG 2.1  --   --  2.2  --   --   --    < >  = values in this interval not displayed.   Recent Labs    06/18/23 0000  AST 16  ALT 15  ALKPHOS 91  ALBUMIN 3.9   Recent Labs    09/25/23 2212 09/27/23 0228 09/29/23 0752 09/30/23 0231 11/19/23 1128  WBC 7.4 9.3 9.4 7.1  --   NEUTROABS 5.6  --   --   --   --   HGB 11.7* 10.3* 11.1* 10.0* 12.2*  HCT 36.7* 31.0* 33.3* 30.9* 36.0*  MCV 91.5 88.3 88.6 90.4  --   PLT 181 187 185 184  --    Lab Results  Component Value Date   TSH 6.02 (A) 06/18/2023   Lab Results  Component Value Date   HGBA1C 5.5 11/23/2016   Lab Results  Component Value Date   CHOL 133 10/17/2022   HDL 49 10/17/2022   LDLCALC 65 10/17/2022   TRIG 122 10/17/2022   CHOLHDL 2.4 11/23/2016    Significant Diagnostic Results in last 30 days:  No results found.  Assessment/Plan: 1. Drowsiness (Primary) - falling asleep during encounter, running into objects with PWC - Differentials: advanced age, anemia, OSA, infection - repeat cbc/diff, cmp - lung sounds diminished  - CXR - if workup normal, consider sleep study, do not recommend aggressive workup due to age   2. Chronic diastolic (congestive) heart failure (HCC) - LVEF 60-65% 09/14/2023 - weight stable, edema improved - 05/28 furosemide  increased to 40 mg daily  3. Essential (primary) hypertension - controlled  - cont amlodipine , chlorthalidone , clonidine , hydralazine , metoprolol  and losartan   4. Mixed hyperlipidemia - Total 133, LDL 65 10/17/2022 - cont atorvastatin   5. PAF (paroxysmal atrial fibrillation) (HCC) - HR< 100 with metoprolol  - cont Eliquis  for clot prevention  6. Stage 3b chronic kidney disease (HCC) - encourage hydration with water  - avoid NSAIDS  7. Iron  deficiency anemia, unspecified iron  deficiency anemia type - hgb stable  - cont Niferex    Family/ staff Communication: plan discussed with patient and nurse  Labs/tests ordered:  cbc/diff, cmp, CXR

## 2024-01-23 ENCOUNTER — Non-Acute Institutional Stay (SKILLED_NURSING_FACILITY): Admitting: Orthopedic Surgery

## 2024-01-23 ENCOUNTER — Encounter: Payer: Self-pay | Admitting: Orthopedic Surgery

## 2024-01-23 DIAGNOSIS — R2689 Other abnormalities of gait and mobility: Secondary | ICD-10-CM | POA: Diagnosis not present

## 2024-01-23 DIAGNOSIS — J069 Acute upper respiratory infection, unspecified: Secondary | ICD-10-CM

## 2024-01-23 DIAGNOSIS — E860 Dehydration: Secondary | ICD-10-CM | POA: Diagnosis not present

## 2024-01-23 DIAGNOSIS — M1712 Unilateral primary osteoarthritis, left knee: Secondary | ICD-10-CM | POA: Diagnosis not present

## 2024-01-23 DIAGNOSIS — M6281 Muscle weakness (generalized): Secondary | ICD-10-CM | POA: Diagnosis not present

## 2024-01-23 MED ORDER — FUROSEMIDE 40 MG PO TABS: 40.0000 mg | ORAL_TABLET | Freq: Every day | ORAL | Status: DC

## 2024-01-23 NOTE — Progress Notes (Signed)
 Location:  [facility] Room Number: 149/A Place of Service:  SNF [phone]) Provider:  Greig Cluster, NP     Patient Care Team: Charlanne Fredia CROME, MD as PCP - General (Internal Medicine) Court Dorn PARAS, MD as PCP - Cardiology (Cardiology)  Extended Emergency Contact Information Primary Emergency Contact: Christyne Madeline LABOR Address: [address]          [place], [place] [address] [place] Home Phone: [phone] Mobile Phone: [phone] Relation: Spouse  Code Status: DNR Goals of care: Advanced Directive information    01/11/2024   11:18 AM  Advanced Directives  Does Patient Have a Medical Advance Directive? Yes  Type of Estate agent of [place];Living will;Out of facility DNR (pink MOST or yellow form)  Copy of Healthcare Power of Attorney in Chart? Yes - validated most recent copy scanned in chart (See row information)     Chief Complaint  Patient presents with   Acutr Visit    Abnormal labs    HPI:  Pt is a 89 y.o. male seen today for an acute visit due to abnormal labs.   He currently resides on the skilled nursing unit at [facility]. PMH: CAD with stents, PAD, renal artery stenosis, HTN, AAA, HLD, GERD, fall with right hip fracture s/p RATH 06/2022, and unstable gait.   06/24 seen due to increased drowsiness. CXR suggested bronchiolitis. On call provider started doxycycline x 7 days. BUN/creat 63/2.32, GFR 26, WBC 9.10, hgb 12.4, hct 35.7 01/22/2024. He does not drink water  well. He is also sitting outside in hot weather without drink. H/o CHF, he is taking furosemide  daily. Afebrile. Vitals stable.    Past Medical History:  Diagnosis Date   AAA (abdominal aortic aneurysm) (HCC)    asymptomatic   AAA (abdominal aortic aneurysm) (HCC) 2010   peripheral  angiogram-- bilateral SFA DISEASE  and 60 to 70% infrarenaal abd. aortic stenosis with 15 -mm gradient   Adenomatous colon polyp 11/2003    CAD (coronary artery disease)    Gait abnormality 02/13/2017   Gastroparesis    pt denies   GERD (gastroesophageal reflux disease)    w/ LPR   History of kidney stones    x2   Hyperlipidemia    Hypertension    IDA (iron  deficiency anemia)    Peripheral arterial disease (HCC)    RCEA  by Dr Medford Brunswick   PUD (peptic ulcer disease)    pt unaware   Vertigo    Past Surgical History:  Procedure Laterality Date   ABDOMINAL AORTOGRAM N/A 11/19/2023   Procedure: ABDOMINAL AORTOGRAM;  Surgeon: Sheree Penne Bruckner, MD;  Location: [facility] INVASIVE CV LAB;  Service: Cardiovascular;  Laterality: N/A;   ABDOMINAL AORTOGRAM W/LOWER EXTREMITY Bilateral 08/05/2018   Procedure: ABDOMINAL AORTOGRAM W/LOWER EXTREMITY;  Surgeon: Court Dorn PARAS, MD;  Location: [facility] INVASIVE CV LAB;  Service: Cardiovascular;  Laterality: Bilateral;   ABDOMINAL AORTOGRAM W/LOWER EXTREMITY N/A 09/26/2021   Procedure: ABDOMINAL AORTOGRAM W/LOWER EXTREMITY;  Surgeon: Court Dorn PARAS, MD;  Location: [facility] INVASIVE CV LAB;  Service: Cardiovascular;  Laterality: N/A;   AORTOGRAM  04/24/2016    Abdominal aortogram, bilateral iliac angiogram, bifemoral runoff   CARDIAC CATHETERIZATION  05/12/2005   RCA   carotid doppler  01/24/2013   RICA endarterectomy,left CCA 0-49%; left bulb and prox ICA 50-69%; bilaateral subclavian < 50%   CAROTID ENDARTERECTOMY  11/01/2010   CATARACT EXTRACTION     COLONOSCOPY     CORONARY ANGIOPLASTY  05/18/2005   2 STENTS distal RCA AND PROXIMAL-MID RCA   5 total stents per pt.   CYSTOSCOPY/URETEROSCOPY/HOLMIUM LASER/STENT PLACEMENT Left 01/11/2018   Procedure: LEFT URETEROSCOPY/HOLMIUM LASER/STENT PLACEMENT;  Surgeon: Cam Morene ORN, MD;  Location: [facility] ORS;  Service: Urology;  Laterality: Left;   DOPPLER ECHOCARDIOGRAPHY  02/07/2012   EF 55%,SHOWED NO ISCHEMIA    HERNIA REPAIR     umbilical   lower arterial  doppler  02/04/2013   aotra 1.5 x 1.5 cm; distal abd aorta 70-99%,proximal common iliac  arteries -very stenotic with increased velocities>50%,may be falsely elevated as a result of residual plaque from the distal aorta stenosis   LOWER EXTREMITY ANGIOGRAPHY N/A 11/19/2023   Procedure: Lower Extremity Angiography;  Surgeon: Sheree Penne Bruckner, MD;  Location: [facility] INVASIVE CV LAB;  Service: Cardiovascular;  Laterality: N/A;   lower extremity doppler  June 18 ,2013   ABI'S ABNORMAL, RABI was 0.88 and LABI 0.75 ,with 3-vessel  run off   NM MYOVIEW  LTD  MAY 23,2011   showed no significant ischemia;   NM MYOVIEW  LTD  04/22/2008   ef 77%,exercise capcity 6 METS ,exaggerated blood pressure response to exercise   PERIPHERAL VASCULAR CATHETERIZATION N/A 04/24/2016   Procedure: Lower Extremity Angiography;  Surgeon: Dorn JINNY Lesches, MD;  Location[facility] INVASIVE CV LAB;  Service: Cardiovascular;  Laterality: N/A;   PERIPHERAL VASCULAR CATHETERIZATION  04/24/2016   Procedure: Peripheral Vascular Intervention;  Surgeon: Dorn JINNY Lesches, MD;  Location: [facility] INVASIVE CV LAB;  Service: Cardiovascular;;  Aorta   retrograde central aortic catheterization  05/19/2005   cutting balloon atherectomy, c-circ stenosis with DES STENTING CYPHER   TONSILLECTOMY     TOTAL HIP ARTHROPLASTY Right 07/11/2022   Procedure: TOTAL HIP ARTHROPLASTY ANTERIOR APPROACH;  Surgeon: Ernie Cough, MD;  Location: [facility] ORS;  Service: Orthopedics;  Laterality: Right;   TOTAL KNEE ARTHROPLASTY Left 01/03/2019   Procedure: TOTAL KNEE ARTHROPLASTY;  Surgeon: Kay Kemps, MD;  Location: [facility] ORS;  Service: Orthopedics;  Laterality: Left;  with IS block   TRANSURETHRAL RESECTION OF PROSTATE N/A 09/08/2016   Procedure: TRANSURETHRAL RESECTION OF THE PROSTATE (TURP);  Surgeon: Morene ORN Cam, MD;  Location: [facility] ORS;  Service: Urology;  Laterality: N/A;   TRANSURETHRAL RESECTION OF PROSTATE     10-10-17  Dr. Cam   TRANSURETHRAL RESECTION OF PROSTATE N/A 10/10/2017   Procedure: TRANSURETHRAL RESECTION OF THE PROSTATE (TURP);   Surgeon: Cam Morene ORN, MD;  Location: [facility] ORS;  Service: Urology;  Laterality: N/A;   VASECTOMY      Allergies  Allergen Reactions   Lisinopril Cough   Niaspan [Niacin Er (Antihyperlipidemic)] Rash    Outpatient Encounter Medications as of 01/23/2024  Medication Sig   acetaminophen  (TYLENOL ) 500 MG tablet Take 1,000 mg by mouth 2 (two) times daily.   amLODipine  (NORVASC ) 10 MG tablet Take 10 mg by mouth in the morning. Hold if BP <95   apixaban  (ELIQUIS ) 5 MG TABS tablet Take 1 tablet (5 mg total) by mouth 2 (two) times daily.   Apoaequorin (PREVAGEN PO) Take 1 tablet by mouth in the morning.   atorvastatin  (LIPITOR) 40 MG tablet Take 40 mg by mouth daily at 6 PM.    Biotin  10 MG TABS Take 10 mg by mouth in the morning. Once a morning 8am-11am   cetirizine  (ZYRTEC ) 10 MG tablet Take 1 tablet (10 mg total) by mouth at bedtime.   chlorthalidone  (HYGROTON ) 50 MG tablet Take 50 mg by mouth in the morning. Hold if  BP < 95   cholecalciferol  (VITAMIN D3) 25 MCG (1000 UT) tablet Take 1,000 Units by mouth in the morning.   cloNIDine  (CATAPRES ) 0.1 MG tablet Take 1 tablet (0.1 mg total) by mouth 3 (three) times daily. Hold if BP < 95   doxycycline (MONODOX) 100 MG capsule Take 100 mg by mouth 2 (two) times daily.   finasteride  (PROSCAR ) 5 MG tablet Take 5 mg by mouth in the morning.   furosemide  (LASIX ) 20 MG tablet Take 20 mg by mouth daily as needed.   furosemide  (LASIX ) 40 MG tablet Take 1 tablet (40 mg total) by mouth daily.   gabapentin  (NEURONTIN ) 100 MG capsule Take 200 mg by mouth 3 (three) times daily.   Glucosamine-Chondroitin 750-600 MG TABS Take 1 tablet by mouth 2 (two) times daily.   hydrALAZINE  (APRESOLINE ) 25 MG tablet Take 25 mg by mouth in the morning and at bedtime. Hold if BP < 95   hydrALAZINE  (APRESOLINE ) 50 MG tablet Take 50 mg by mouth daily at 4 PM. Hold if BP <95   iron  polysaccharides (NIFEREX) 150 MG capsule Take 150 mg by mouth every Monday, Wednesday, and  Friday.   losartan  (COZAAR ) 50 MG tablet Take 50 mg by mouth 2 (two) times daily.   melatonin 5 MG TABS Take 1 tablet (5 mg total) by mouth at bedtime.   metoprolol  tartrate (LOPRESSOR ) 50 MG tablet Take 75 mg by mouth 2 (two) times daily.   Multiple Vitamin (MULTIVITAMIN WITH MINERALS) TABS tablet Take 1 tablet by mouth in the morning.   pantoprazole  (PROTONIX ) 40 MG tablet Take 40 mg by mouth 2 (two) times daily.   potassium chloride  SA (K-DUR,KLOR-CON ) 20 MEQ tablet Take 20 mEq by mouth every evening.    tamsulosin  (FLOMAX ) 0.4 MG CAPS capsule Take 1 capsule (0.4 mg total) by mouth daily after supper.   No facility-administered encounter medications on file as of 01/23/2024.    Review of Systems  Constitutional:  Positive for fatigue. Negative for fever.  HENT:  Negative for sore throat and trouble swallowing.   Respiratory:  Negative for cough, shortness of breath and wheezing.   Cardiovascular:  Negative for chest pain and leg swelling.  Gastrointestinal:  Negative for abdominal distention and abdominal pain.  Genitourinary:  Negative for dysuria, frequency and hematuria.  Musculoskeletal:  Positive for gait problem.  Neurological:  Positive for weakness. Negative for dizziness and light-headedness.  Psychiatric/Behavioral:  Negative for dysphoric mood. The patient is not nervous/anxious.     Immunization History  Administered Date(s) Administered   Fluad  Quad(high Dose 65+) 05/02/2022, 05/22/2023   Influenza, High Dose Seasonal PF 03/31/2017, 03/29/2018   Influenza,inj,Quad PF,6+ Mos 04/25/2016, 05/06/2018   Influenza,inj,quad, With Preservative 05/26/2019   Influenza-Unspecified 05/14/2001, 05/31/2001, 05/14/2002, 05/15/2002, 05/18/2003, 05/25/2004, 04/30/2005, 05/29/2006, 05/01/2007, 05/11/2008, 05/31/2009, 05/31/2010, 04/30/2013, 05/31/2014, 03/31/2021   Moderna Covid-19 Vaccine Bivalent Booster 68yrs & up 08/31/2021, 05/22/2023   Moderna SARS-COV2 Booster Vaccination  06/15/2020, 11/19/2020, 01/02/2022   Moderna Sars-Covid-2 Vaccination 08/12/2019, 09/10/2019, 05/31/2022   Pfizer Covid-19 Vaccine Bivalent Booster 73yrs & up 11/23/2022   Pneumococcal Polysaccharide-23 05/02/2000, 09/24/2018   Pneumococcal-Unspecified 01/12/2024   Rsv, Bivalent, Protein Subunit Rsvpref,pf Marlow) 07/19/2022   Td 09/08/2013   Zoster Recombinant(Shingrix) 10/04/2017, 01/23/2018   Pertinent  Health Maintenance Due  Topic Date Due   INFLUENZA VACCINE  02/29/2024      07/28/2022    9:00 AM 10/14/2022    7:21 AM 11/28/2022    2:50 PM 06/04/2023    4:59 PM  01/11/2024   11:18 AM  Fall Risk  Falls in the past year?  1 1 0 1  Was there an injury with Fall?  1 0 0 1  Fall Risk Category Calculator  3 1 0 2  (RETIRED) Patient Fall Risk Level High fall risk       Patient at Risk for Falls Due to   History of fall(s);Impaired balance/gait;Impaired mobility No Fall Risks History of fall(s)  Fall risk Follow up  Falls evaluation completed Falls evaluation completed;Education provided;Falls prevention discussed Falls evaluation completed Falls evaluation completed     Data saved with a previous flowsheet row definition   Functional Status Survey:    Vitals:   01/23/24 1312  BP: (!) 156/73  Pulse: (!) 14  Resp: 14  Temp: 97.7 F (36.5 C)  SpO2: 95%  Weight: 183 lb 9.6 oz (83.3 kg)  Height: 5' 4 (1.626 m)   Body mass index is 31.51 kg/m. Physical Exam Vitals reviewed.  Constitutional:      General: He is not in acute distress. HENT:     Head: Normocephalic.   Eyes:     General:        Right eye: No discharge.        Left eye: No discharge.    Cardiovascular:     Rate and Rhythm: Normal rate and regular rhythm.     Pulses: Normal pulses.     Heart sounds: Normal heart sounds.  Pulmonary:     Effort: Pulmonary effort is normal. No respiratory distress.     Breath sounds: Examination of the right-middle field reveals decreased breath sounds. Examination of  the left-middle field reveals decreased breath sounds. Decreased breath sounds and rhonchi present. No wheezing or rales.  Abdominal:     General: There is no distension.     Palpations: Abdomen is soft.     Tenderness: There is no abdominal tenderness.   Musculoskeletal:     Cervical back: Neck supple.     Right lower leg: No edema.     Left lower leg: No edema.   Skin:    General: Skin is warm.     Capillary Refill: Capillary refill takes less than 2 seconds.   Neurological:     General: No focal deficit present.     Mental Status: He is alert. Mental status is at baseline.     Motor: Weakness present.     Gait: Gait abnormal.   Psychiatric:        Mood and Affect: Mood normal.     Labs reviewed: Recent Labs    09/28/23 0236 09/29/23 0752 09/30/23 0231 10/01/23 0234 11/19/23 1128 12/18/23 0000 12/31/23 0000 01/22/24 0000  NA 142   < > 142 140 144  --  143 141  K 3.7   < > 3.4* 3.6 4.7 4.5 4.4 3.9  CL 106   < > 107 106 108 105 108 104  CO2 26   < > 24 24  --  23* 21 26*  GLUCOSE 120*   < > 111* 102* 136*  --   --   --   BUN 49*   < > 37* 32* 29* 41* 50* 63*  CREATININE 1.99*   < > 1.37* 1.16 1.20 1.6* 1.7* 2.3*  CALCIUM  8.7*   < > 8.8* 8.8*  --  8.9 9.0 9.1  MG 2.1  --   --  2.2  --   --   --   --    < > =  values in this interval not displayed.   Recent Labs    06/18/23 0000 01/22/24 0000  AST 16 14  ALT 15 16  ALKPHOS 91 96  ALBUMIN 3.9 3.9   Recent Labs    09/25/23 2212 09/27/23 0228 09/29/23 0752 09/30/23 0231 11/19/23 1128 01/22/24 0000  WBC 7.4 9.3 9.4 7.1  --  9.1  NEUTROABS 5.6  --   --   --   --   --   HGB 11.7* 10.3* 11.1* 10.0* 12.2* 12.4*  HCT 36.7* 31.0* 33.3* 30.9* 36.0* 36*  MCV 91.5 88.3 88.6 90.4  --   --   PLT 181 187 185 184  --  211   Lab Results  Component Value Date   TSH 6.02 (A) 06/18/2023   Lab Results  Component Value Date   HGBA1C 5.5 11/23/2016   Lab Results  Component Value Date   CHOL 133 10/17/2022   HDL  49 10/17/2022   LDLCALC 65 10/17/2022   TRIG 122 10/17/2022   CHOLHDL 2.4 11/23/2016    Significant Diagnostic Results in last 30 days:  No results found.  Assessment/Plan 1. Dehydration (Primary) - 06/24 drowsiness  - BUN/creat 63/2.32 - appears baseline creat 1.7 - concerns for ARF/ dehydration  - does not drink water  well> do not recommend IVF due to CHF - on furosemide   - encourage oral hydration - hold furosemide  until 06/30> nursing gave furosemide  today 06/25 - recheck bmp 06/30  2. Upper respiratory tract infection, unspecified type - 06/24 drowsiness - middle lobes diminished - CXR revealed bronchiolitis - WBC 9.10  - 06/24 doxycycline x 7 days started by on call     Family/ staff Communication: plan discussed with patient and nurse   Labs/tests ordered: bmp 06/30

## 2024-01-24 DIAGNOSIS — M6281 Muscle weakness (generalized): Secondary | ICD-10-CM | POA: Diagnosis not present

## 2024-01-24 DIAGNOSIS — R2689 Other abnormalities of gait and mobility: Secondary | ICD-10-CM | POA: Diagnosis not present

## 2024-01-24 DIAGNOSIS — R278 Other lack of coordination: Secondary | ICD-10-CM | POA: Diagnosis not present

## 2024-01-24 DIAGNOSIS — M1712 Unilateral primary osteoarthritis, left knee: Secondary | ICD-10-CM | POA: Diagnosis not present

## 2024-01-24 DIAGNOSIS — R296 Repeated falls: Secondary | ICD-10-CM | POA: Diagnosis not present

## 2024-01-25 ENCOUNTER — Telehealth: Payer: Self-pay | Admitting: Pharmacist

## 2024-01-25 DIAGNOSIS — M1712 Unilateral primary osteoarthritis, left knee: Secondary | ICD-10-CM | POA: Diagnosis not present

## 2024-01-25 DIAGNOSIS — R2689 Other abnormalities of gait and mobility: Secondary | ICD-10-CM | POA: Diagnosis not present

## 2024-01-25 DIAGNOSIS — M6281 Muscle weakness (generalized): Secondary | ICD-10-CM | POA: Diagnosis not present

## 2024-01-25 DIAGNOSIS — E7800 Pure hypercholesterolemia, unspecified: Secondary | ICD-10-CM

## 2024-01-25 NOTE — Progress Notes (Signed)
   01/25/2024  Patient ID: Nathan Santiago, male   DOB: 31-Jul-1931, 89 y.o.   MRN: [id number]  Pharmacy Quality Measure Review  This patient is appearing on a report for being at risk of failing the adherence measure for cholesterol (statin) medications this calendar year.   Medication: Atorvastatin  40 mg Last fill date: 01/15/24 for 30 day supply  Filled at [facility] for a 30 day supply--Patient is in a nursing home.   Cassius DOROTHA Brought, PharmD, BCACP Clinical Pharmacist [phone]

## 2024-01-28 ENCOUNTER — Non-Acute Institutional Stay (SKILLED_NURSING_FACILITY): Admitting: Adult Health

## 2024-01-28 ENCOUNTER — Encounter: Payer: Self-pay | Admitting: Adult Health

## 2024-01-28 DIAGNOSIS — N179 Acute kidney failure, unspecified: Secondary | ICD-10-CM | POA: Diagnosis not present

## 2024-01-28 DIAGNOSIS — M1712 Unilateral primary osteoarthritis, left knee: Secondary | ICD-10-CM | POA: Diagnosis not present

## 2024-01-28 DIAGNOSIS — R2689 Other abnormalities of gait and mobility: Secondary | ICD-10-CM | POA: Diagnosis not present

## 2024-01-28 DIAGNOSIS — J069 Acute upper respiratory infection, unspecified: Secondary | ICD-10-CM | POA: Diagnosis not present

## 2024-01-28 DIAGNOSIS — M6281 Muscle weakness (generalized): Secondary | ICD-10-CM | POA: Diagnosis not present

## 2024-01-28 DIAGNOSIS — N189 Chronic kidney disease, unspecified: Secondary | ICD-10-CM | POA: Diagnosis not present

## 2024-01-28 LAB — COMPREHENSIVE METABOLIC PANEL WITH GFR
Calcium: 9.5 (ref 8.7–10.7)
eGFR: 36

## 2024-01-28 LAB — BASIC METABOLIC PANEL WITH GFR
BUN: 58 — AB (ref 4–21)
CO2: 21 (ref 13–22)
Chloride: 105 (ref 99–108)
Creatinine: 1.8 — AB (ref 0.6–1.3)
Glucose: 162
Potassium: 4.3 meq/L (ref 3.5–5.1)
Sodium: 140 (ref 137–147)

## 2024-01-28 NOTE — Progress Notes (Unsigned)
 Location:  [facility] Room Number: 149 A Place of Service:  SNF [phone]) Provider:  Tawni America, NP    Patient Care Team: Charlanne Fredia CROME, MD as PCP - General (Internal Medicine) Court Dorn PARAS, MD as PCP - Cardiology (Cardiology)  Extended Emergency Contact Information Primary Emergency Contact: Christyne Madeline LABOR Address: [address]          [place], [place] [address] [place] Home Phone: [phone] Mobile Phone: [phone] Relation: Spouse  Code Status:  DNR Goals of care: Advanced Directive information    01/11/2024   11:18 AM  Advanced Directives  Does Patient Have a Medical Advance Directive? Yes  Type of Estate agent of [place];Living will;Out of facility DNR (pink MOST or yellow form)  Copy of Healthcare Power of Attorney in Chart? Yes - validated most recent copy scanned in chart (See row information)     Chief Complaint  Patient presents with   Acute Visit    Follow up elevated creatinine    HPI:   The patient is a 89 year old who presents with lethargy and needs follow up  He has been experiencing lethargy and drowsiness, which led to the current visit. A chest x-ray on June 24th showed mild bilateral peribronchial thickening, and he was started on a seven-day course of doxycycline. No cough or shortness of breath is reported, and he feels improved.  A  (BMP) conducted on June 24th showed a BUN of 63, creatinine of 2.32, and glucose of 232, indicating acute kidney injury and hyperglycemia. A repeat BMP is pending to assess renal function and glucose levels. Lasix  was placed on hold. Remains on ARB and chlorthalidone . BP ok 146/62  He mentions some right heel tenderness and bilateral leg edema. His weight is noted to be 188 pounds with a slight upward trend. Past Medical History:  Diagnosis Date   AAA (abdominal aortic aneurysm) (HCC)    asymptomatic   AAA (abdominal  aortic aneurysm) (HCC) 2010   peripheral  angiogram-- bilateral SFA DISEASE  and 60 to 70% infrarenaal abd. aortic stenosis with 15 -mm gradient   Adenomatous colon polyp 11/2003   CAD (coronary artery disease)    Gait abnormality 02/13/2017   Gastroparesis    pt denies   GERD (gastroesophageal reflux disease)    w/ LPR   History of kidney stones    x2   Hyperlipidemia    Hypertension    IDA (iron  deficiency anemia)    Peripheral arterial disease (HCC)    RCEA  by Dr Medford Brunswick   PUD (peptic ulcer disease)    pt unaware   Vertigo    Past Surgical History:  Procedure Laterality Date   ABDOMINAL AORTOGRAM N/A 11/19/2023   Procedure: ABDOMINAL AORTOGRAM;  Surgeon: Sheree Penne Bruckner, MD;  Location: [facility] INVASIVE CV LAB;  Service: Cardiovascular;  Laterality: N/A;   ABDOMINAL AORTOGRAM W/LOWER EXTREMITY Bilateral 08/05/2018   Procedure: ABDOMINAL AORTOGRAM W/LOWER EXTREMITY;  Surgeon: Court Dorn PARAS, MD;  Location: [facility] INVASIVE CV LAB;  Service: Cardiovascular;  Laterality: Bilateral;   ABDOMINAL AORTOGRAM W/LOWER EXTREMITY N/A 09/26/2021   Procedure: ABDOMINAL AORTOGRAM W/LOWER EXTREMITY;  Surgeon: Court Dorn PARAS, MD;  Location: [facility] INVASIVE CV LAB;  Service: Cardiovascular;  Laterality: N/A;   AORTOGRAM  04/24/2016    Abdominal aortogram, bilateral iliac angiogram, bifemoral runoff   CARDIAC CATHETERIZATION  05/12/2005   RCA   carotid doppler  01/24/2013   RICA endarterectomy,left CCA 0-49%; left  bulb and prox ICA 50-69%; bilaateral subclavian < 50%   CAROTID ENDARTERECTOMY  11/01/2010   CATARACT EXTRACTION     COLONOSCOPY     CORONARY ANGIOPLASTY  05/18/2005   2 STENTS distal RCA AND PROXIMAL-MID RCA   5 total stents per pt.   CYSTOSCOPY/URETEROSCOPY/HOLMIUM LASER/STENT PLACEMENT Left 01/11/2018   Procedure: LEFT URETEROSCOPY/HOLMIUM LASER/STENT PLACEMENT;  Surgeon: Cam Morene ORN, MD;  Location: [facility] ORS;  Service: Urology;  Laterality: Left;   DOPPLER  ECHOCARDIOGRAPHY  02/07/2012   EF 55%,SHOWED NO ISCHEMIA    HERNIA REPAIR     umbilical   lower arterial  doppler  02/04/2013   aotra 1.5 x 1.5 cm; distal abd aorta 70-99%,proximal common iliac arteries -very stenotic with increased velocities>50%,may be falsely elevated as a result of residual plaque from the distal aorta stenosis   LOWER EXTREMITY ANGIOGRAPHY N/A 11/19/2023   Procedure: Lower Extremity Angiography;  Surgeon: Sheree Penne Bruckner, MD;  Location: [facility] INVASIVE CV LAB;  Service: Cardiovascular;  Laterality: N/A;   lower extremity doppler  June 18 ,2013   ABI'S ABNORMAL, RABI was 0.88 and LABI 0.75 ,with 3-vessel  run off   NM MYOVIEW  LTD  MAY 23,2011   showed no significant ischemia;   NM MYOVIEW  LTD  04/22/2008   ef 77%,exercise capcity 6 METS ,exaggerated blood pressure response to exercise   PERIPHERAL VASCULAR CATHETERIZATION N/A 04/24/2016   Procedure: Lower Extremity Angiography;  Surgeon: Dorn JINNY Lesches, MD;  Location: [facility] INVASIVE CV LAB;  Service: Cardiovascular;  Laterality: N/A;   PERIPHERAL VASCULAR CATHETERIZATION  04/24/2016   Procedure: Peripheral Vascular Intervention;  Surgeon: Dorn JINNY Lesches, MD;  Location: [facility] INVASIVE CV LAB;  Service: Cardiovascular;;  Aorta   retrograde central aortic catheterization  05/19/2005   cutting balloon atherectomy, c-circ stenosis with DES STENTING CYPHER   TONSILLECTOMY     TOTAL HIP ARTHROPLASTY Right 07/11/2022   Procedure: TOTAL HIP ARTHROPLASTY ANTERIOR APPROACH;  Surgeon: Ernie Cough, MD;  Location: [facility] ORS;  Service: Orthopedics;  Laterality: Right;   TOTAL KNEE ARTHROPLASTY Left 01/03/2019   Procedure: TOTAL KNEE ARTHROPLASTY;  Surgeon: Kay Kemps, MD;  Location: [facility] OR[facility];  Service: Orthopedics;  Laterality: Left;  with IS block   TRANSURETHRAL RESECTION OF PROSTATE N/A 09/08/2016   Procedure: TRANSURETHRAL RESECTION OF THE PROSTATE (TURP);  Surgeon: Morene ORN Cam, MD;  Location: [facility] OR[facility];  Service: Urology;   Laterality: N/A;   TRANSURETHRAL RESECTION OF PROSTATE     10-10-17  Dr. Cam   TRANSURETHRAL RESECTION OF PROSTATE N/A 10/10/2017   Procedure: TRANSURETHRAL RESECTION OF THE PROSTATE (TURP);  Surgeon: Cam Morene ORN, MD;  Location: [facility] ORS;  Service: Urology;  Laterality: N/A;   VASECTOMY      Allergies  Allergen Reactions   Lisinopril Cough   Niaspan [Niacin Er (Antihyperlipidemic)] Rash    Outpatient Encounter Medications as of 01/28/2024  Medication Sig   acetaminophen  (TYLENOL ) 500 MG tablet Take 1,000 mg by mouth 2 (two) times daily.   amLODipine  (NORVASC ) 10 MG tablet Take 10 mg by mouth in the morning. Hold if BP <95   apixaban  (ELIQUIS ) 5 MG TABS tablet Take 1 tablet (5 mg total) by mouth 2 (two) times daily.   Apoaequorin (PREVAGEN PO) Take 1 tablet by mouth in the morning.   atorvastatin  (LIPITOR) 40 MG tablet Take 40 mg by mouth daily at 6 PM.    Biotin  10 MG TABS Take 10 mg by mouth in the morning. Once a morning 8am-11am   cetirizine  (  ZYRTEC ) 10 MG tablet Take 1 tablet (10 mg total) by mouth at bedtime.   chlorthalidone  (HYGROTON ) 50 MG tablet Take 50 mg by mouth in the morning. Hold if BP < 95   cholecalciferol  (VITAMIN D3) 25 MCG (1000 UT) tablet Take 1,000 Units by mouth in the morning.   cloNIDine  (CATAPRES ) 0.1 MG tablet Take 1 tablet (0.1 mg total) by mouth 3 (three) times daily. Hold if BP < 95   doxycycline (MONODOX) 100 MG capsule Take 100 mg by mouth 2 (two) times daily.   finasteride  (PROSCAR ) 5 MG tablet Take 5 mg by mouth in the morning.   furosemide  (LASIX ) 20 MG tablet Take 20 mg by mouth daily as needed (HOLD UNTIL 01/28/2024).   furosemide  (LASIX ) 40 MG tablet Take 1 tablet (40 mg total) by mouth daily. HOLD UNTIL 01/28/2024   gabapentin  (NEURONTIN ) 100 MG capsule Take 200 mg by mouth 3 (three) times daily.   Glucosamine-Chondroitin 750-600 MG TABS Take 1 tablet by mouth 2 (two) times daily.   hydrALAZINE  (APRESOLINE ) 25 MG tablet Take 25 mg by mouth  in the morning and at bedtime. Hold if BP < 95   hydrALAZINE  (APRESOLINE ) 50 MG tablet Take 50 mg by mouth daily at 4 PM. Hold if BP <95   iron  polysaccharides (NIFEREX) 150 MG capsule Take 150 mg by mouth every Monday, Wednesday, and Friday.   losartan  (COZAAR ) 50 MG tablet Take 50 mg by mouth 2 (two) times daily.   melatonin 5 MG TABS Take 1 tablet (5 mg total) by mouth at bedtime.   metoprolol  tartrate (LOPRESSOR ) 50 MG tablet Take 75 mg by mouth 2 (two) times daily.   Multiple Vitamin (MULTIVITAMIN WITH MINERALS) TABS tablet Take 1 tablet by mouth in the morning.   pantoprazole  (PROTONIX ) 40 MG tablet Take 40 mg by mouth 2 (two) times daily.   potassium chloride  SA (K-DUR,KLOR-CON ) 20 MEQ tablet Take 20 mEq by mouth every evening.    tamsulosin  (FLOMAX ) 0.4 MG CAPS capsule Take 1 capsule (0.4 mg total) by mouth daily after supper.   No facility-administered encounter medications on file as of 01/28/2024.    Review of Systems  Constitutional:  Negative for activity change, appetite change, chills, diaphoresis, fatigue, fever and unexpected weight change.  HENT:  Negative for congestion, sinus pressure, sinus pain and sore throat.   Respiratory:  Negative for cough, shortness of breath, wheezing and stridor.   Cardiovascular:  Positive for leg swelling. Negative for chest pain and palpitations.  Gastrointestinal:  Negative for abdominal distention, abdominal pain, constipation and diarrhea.  Genitourinary:  Negative for difficulty urinating and dysuria.  Musculoskeletal:  Positive for gait problem. Negative for arthralgias, back pain, joint swelling and myalgias.  Neurological:  Negative for dizziness, seizures, syncope, facial asymmetry, speech difficulty, weakness and headaches.       Right heel tenderness.   Hematological:  Negative for adenopathy. Does not bruise/bleed easily.  Psychiatric/Behavioral:  Negative for agitation, behavioral problems and confusion.     Immunization  History  Administered Date(s) Administered   Fluad  Quad(high Dose 65+) 05/02/2022, 05/22/2023   Influenza, High Dose Seasonal PF 03/31/2017, 03/29/2018   Influenza,inj,Quad PF,6+ Mos 04/25/2016, 05/06/2018   Influenza,inj,quad, With Preservative 05/26/2019   Influenza-Unspecified 05/14/2001, 05/31/2001, 05/14/2002, 05/15/2002, 05/18/2003, 05/25/2004, 04/30/2005, 05/29/2006, 05/01/2007, 05/11/2008, 05/31/2009, 05/31/2010, 04/30/2013, 05/31/2014, 03/31/2021   Moderna Covid-19 Vaccine Bivalent Booster 65yrs & up 08/31/2021, 05/22/2023   Moderna SARS-COV2 Booster Vaccination 06/15/2020, 11/19/2020, 01/02/2022   Moderna Sars-Covid-2 Vaccination 08/12/2019, 09/10/2019, 05/31/2022  Pfizer Covid-19 Vaccine Bivalent Booster 10yrs & up 11/23/2022   Pneumococcal Polysaccharide-23 05/02/2000, 09/24/2018   Pneumococcal-Unspecified 01/12/2024   Rsv, Bivalent, Protein Subunit Rsvpref,pf [place]) 07/19/2022   Td 09/08/2013   Tetanus 01/25/2024   Zoster Recombinant(Shingrix) 10/04/2017, 01/23/2018   Pertinent  Health Maintenance Due  Topic Date Due   INFLUENZA VACCINE  02/29/2024      07/28/2022    9:00 AM 10/14/2022    7:21 AM 11/28/2022    2:50 PM 06/04/2023    4:59 PM 01/11/2024   11:18 AM  Fall Risk  Falls in the past year?  1 1 0 1  Was there an injury with Fall?  1 0 0 1  Fall Risk Category Calculator  3 1 0 2  (RETIRED) Patient Fall Risk Level High fall risk       Patient at Risk for Falls Due to   History of fall(s);Impaired balance/gait;Impaired mobility No Fall Risks History of fall(s)  Fall risk Follow up  Falls evaluation completed Falls evaluation completed;Education provided;Falls prevention discussed Falls evaluation completed Falls evaluation completed     Data saved with a previous flowsheet row definition   Functional Status Survey:    Vitals:   01/28/24 1018  BP: (!) 146/62  Pulse: 64  Resp: 16  Temp: (!) 97.4 F (36.3 C)  SpO2: 94%  Weight: 188 lb 6.4 oz (85.5 kg)   Height: 5' 4 (1.626 m)   Body mass index is 32.34 kg/m. Physical Exam Vitals and nursing note reviewed.  Constitutional:      Appearance: Normal appearance.  HENT:     Head: Normocephalic and atraumatic.     Nose: Nose normal.     Mouth/Throat:     Mouth: Mucous membranes are moist.     Pharynx: Oropharynx is clear.   Eyes:     Conjunctiva/sclera: Conjunctivae normal.     Pupils: Pupils are equal, round, and reactive to light.    Cardiovascular:     Rate and Rhythm: Normal rate and regular rhythm.     Heart sounds: No murmur heard. Pulmonary:     Effort: Pulmonary effort is normal. No respiratory distress.     Breath sounds: Normal breath sounds. No wheezing.  Abdominal:     General: Bowel sounds are normal. There is no distension.     Palpations: Abdomen is soft.     Tenderness: There is no abdominal tenderness.   Musculoskeletal:     Right lower leg: Edema (+1) present.     Left lower leg: Edema (+2) present.   Skin:    General: Skin is warm and dry.     Comments: Bogginess to right heel, no open areas.  No open areas to left foot Not able to palpate pedal pulses.   Neurological:     General: No focal deficit present.     Mental Status: He is alert and oriented to person, place, and time. Mental status is at baseline.   Psychiatric:        Mood and Affect: Mood normal.     Labs reviewed: Recent Labs    09/28/23 0236 09/29/23 0752 09/30/23 0231 10/01/23 0234 11/19/23 1128 12/18/23 0000 12/31/23 0000 01/22/24 0000  NA 142   < > 142 140 144  --  143 141  K 3.7   < > 3.4* 3.6 4.7 4.5 4.4 3.9  CL 106   < > 107 106 108 105 108 104  CO2 26   < > 24 24  --  23* 21 26*  GLUCOSE 120*   < > 111* 102* 136*  --   --   --   BUN 49*   < > 37* 32* 29* 41* 50* 63*  CREATININE 1.99*   < > 1.37* 1.16 1.20 1.6* 1.7* 2.3*  CALCIUM  8.7*   < > 8.8* 8.8*  --  8.9 9.0 9.1  MG 2.1  --   --  2.2  --   --   --   --    < > = values in this interval not displayed.    Recent Labs    06/18/23 0000 01/22/24 0000  AST 16 14  ALT 15 16  ALKPHOS 91 96  ALBUMIN 3.9 3.9   Recent Labs    09/25/23 2212 09/27/23 0228 09/29/23 0752 09/30/23 0231 11/19/23 1128 01/22/24 0000  WBC 7.4 9.3 9.4 7.1  --  9.1  NEUTROABS 5.6  --   --   --   --   --   HGB 11.7* 10.3* 11.1* 10.0* 12.2* 12.4*  HCT 36.7* 31.0* 33.3* 30.9* 36.0* 36*  MCV 91.5 88.3 88.6 90.4  --   --   PLT 181 187 185 184  --  211   Lab Results  Component Value Date   TSH 6.02 (A) 06/18/2023   Lab Results  Component Value Date   HGBA1C 5.5 11/23/2016   Lab Results  Component Value Date   CHOL 133 10/17/2022   HDL 49 10/17/2022   LDLCALC 65 10/17/2022   TRIG 122 10/17/2022   CHOLHDL 2.4 11/23/2016    Significant Diagnostic Results in last 30 days:  No results found.  Assessment/Plan  Bronchitis, resp infection  Chest x-ray showed mild bilateral peribronchial thickening. Started on doxycycline with reported improvement. - Continue doxycycline as prescribed.  Acute kidney injury Some weakness and lethargy still noticed but improving.  BMP showed elevated BUN and creatinine. Repeat 01/28/24 BUN 58.1 Cr 1.78 indicating slight rise of BUN and decrease in Cr.  - lasix  on hold and due to restart in the am On ARB and chlorthalidone  also with AKI Will hold chlorthalidone .  Reduce losartan  to 50 mg daily Repeat BMP 1 week

## 2024-01-29 DIAGNOSIS — R2689 Other abnormalities of gait and mobility: Secondary | ICD-10-CM | POA: Diagnosis not present

## 2024-01-29 DIAGNOSIS — M6281 Muscle weakness (generalized): Secondary | ICD-10-CM | POA: Diagnosis not present

## 2024-01-29 DIAGNOSIS — M1712 Unilateral primary osteoarthritis, left knee: Secondary | ICD-10-CM | POA: Diagnosis not present

## 2024-01-30 DIAGNOSIS — M1712 Unilateral primary osteoarthritis, left knee: Secondary | ICD-10-CM | POA: Diagnosis not present

## 2024-01-30 DIAGNOSIS — R2689 Other abnormalities of gait and mobility: Secondary | ICD-10-CM | POA: Diagnosis not present

## 2024-01-30 DIAGNOSIS — R3914 Feeling of incomplete bladder emptying: Secondary | ICD-10-CM | POA: Diagnosis not present

## 2024-01-30 DIAGNOSIS — M6281 Muscle weakness (generalized): Secondary | ICD-10-CM | POA: Diagnosis not present

## 2024-01-31 DIAGNOSIS — M6281 Muscle weakness (generalized): Secondary | ICD-10-CM | POA: Diagnosis not present

## 2024-01-31 DIAGNOSIS — R278 Other lack of coordination: Secondary | ICD-10-CM | POA: Diagnosis not present

## 2024-01-31 DIAGNOSIS — R296 Repeated falls: Secondary | ICD-10-CM | POA: Diagnosis not present

## 2024-01-31 DIAGNOSIS — R2689 Other abnormalities of gait and mobility: Secondary | ICD-10-CM | POA: Diagnosis not present

## 2024-01-31 DIAGNOSIS — M1712 Unilateral primary osteoarthritis, left knee: Secondary | ICD-10-CM | POA: Diagnosis not present

## 2024-02-01 DIAGNOSIS — M1712 Unilateral primary osteoarthritis, left knee: Secondary | ICD-10-CM | POA: Diagnosis not present

## 2024-02-01 DIAGNOSIS — R2689 Other abnormalities of gait and mobility: Secondary | ICD-10-CM | POA: Diagnosis not present

## 2024-02-01 DIAGNOSIS — M6281 Muscle weakness (generalized): Secondary | ICD-10-CM | POA: Diagnosis not present

## 2024-02-01 DIAGNOSIS — R278 Other lack of coordination: Secondary | ICD-10-CM | POA: Diagnosis not present

## 2024-02-01 DIAGNOSIS — R296 Repeated falls: Secondary | ICD-10-CM | POA: Diagnosis not present

## 2024-02-02 DIAGNOSIS — R296 Repeated falls: Secondary | ICD-10-CM | POA: Diagnosis not present

## 2024-02-02 DIAGNOSIS — R278 Other lack of coordination: Secondary | ICD-10-CM | POA: Diagnosis not present

## 2024-02-03 DIAGNOSIS — R296 Repeated falls: Secondary | ICD-10-CM | POA: Diagnosis not present

## 2024-02-03 DIAGNOSIS — R278 Other lack of coordination: Secondary | ICD-10-CM | POA: Diagnosis not present

## 2024-02-04 DIAGNOSIS — R296 Repeated falls: Secondary | ICD-10-CM | POA: Diagnosis not present

## 2024-02-04 DIAGNOSIS — I130 Hypertensive heart and chronic kidney disease with heart failure and stage 1 through stage 4 chronic kidney disease, or unspecified chronic kidney disease: Secondary | ICD-10-CM | POA: Diagnosis not present

## 2024-02-04 DIAGNOSIS — M6281 Muscle weakness (generalized): Secondary | ICD-10-CM | POA: Diagnosis not present

## 2024-02-04 DIAGNOSIS — M1712 Unilateral primary osteoarthritis, left knee: Secondary | ICD-10-CM | POA: Diagnosis not present

## 2024-02-04 DIAGNOSIS — R2689 Other abnormalities of gait and mobility: Secondary | ICD-10-CM | POA: Diagnosis not present

## 2024-02-04 DIAGNOSIS — R278 Other lack of coordination: Secondary | ICD-10-CM | POA: Diagnosis not present

## 2024-02-04 LAB — BASIC METABOLIC PANEL WITH GFR
BUN: 40 — AB (ref 4–21)
CO2: 23 — AB (ref 13–22)
Chloride: 104 (ref 99–108)
Creatinine: 1.3 (ref 0.6–1.3)
Glucose: 183
Potassium: 4.2 meq/L (ref 3.5–5.1)
Sodium: 143 (ref 137–147)

## 2024-02-04 LAB — COMPREHENSIVE METABOLIC PANEL WITH GFR
Calcium: 9.3 (ref 8.7–10.7)
eGFR: 52

## 2024-02-05 DIAGNOSIS — R278 Other lack of coordination: Secondary | ICD-10-CM | POA: Diagnosis not present

## 2024-02-05 DIAGNOSIS — R296 Repeated falls: Secondary | ICD-10-CM | POA: Diagnosis not present

## 2024-02-06 DIAGNOSIS — R296 Repeated falls: Secondary | ICD-10-CM | POA: Diagnosis not present

## 2024-02-06 DIAGNOSIS — R278 Other lack of coordination: Secondary | ICD-10-CM | POA: Diagnosis not present

## 2024-02-07 ENCOUNTER — Non-Acute Institutional Stay (SKILLED_NURSING_FACILITY): Payer: Self-pay | Admitting: Adult Health

## 2024-02-07 ENCOUNTER — Encounter: Payer: Self-pay | Admitting: Adult Health

## 2024-02-07 DIAGNOSIS — E1122 Type 2 diabetes mellitus with diabetic chronic kidney disease: Secondary | ICD-10-CM

## 2024-02-07 DIAGNOSIS — R278 Other lack of coordination: Secondary | ICD-10-CM | POA: Diagnosis not present

## 2024-02-07 DIAGNOSIS — I10 Essential (primary) hypertension: Secondary | ICD-10-CM | POA: Diagnosis not present

## 2024-02-07 DIAGNOSIS — N1832 Chronic kidney disease, stage 3b: Secondary | ICD-10-CM | POA: Diagnosis not present

## 2024-02-07 DIAGNOSIS — R296 Repeated falls: Secondary | ICD-10-CM | POA: Diagnosis not present

## 2024-02-07 NOTE — Progress Notes (Signed)
 Location:  [facility] Room Number: 149 P Place of Service:  SNF [phone]) Provider: Tawni America, NP    Patient Care Team: Charlanne Fredia CROME, MD as PCP - General (Internal Medicine) Court Dorn PARAS, MD as PCP - Cardiology (Cardiology)  Extended Emergency Contact Information Primary Emergency Contact: Christyne Madeline LABOR Address: [address]          [place], [place] [address] [place] Home Phone: [phone] Mobile Phone: [phone] Relation: Spouse  Code Status:  DNR Goals of care: Advanced Directive information    01/11/2024   11:18 AM  Advanced Directives  Does Patient Have a Medical Advance Directive? Yes  Type of Estate agent of [place];Living will;Out of facility DNR (pink MOST or yellow form)  Copy of Healthcare Power of Attorney in Chart? Yes - validated most recent copy scanned in chart (See row information)     Chief Complaint  Patient presents with   Acute Visit    HTN F/U    HPI:  Pt is a 89 y.o. male seen today for an acute visit for follow up regarding bp  Losartan  was reduced and chlorthalidone  discontinued due to CKD BUN initially was 63 and Cr 2.32 and lasix  was held.  02/04/24 BUN 40 CR 1.3  Less lethargy and less edema.   BP has been difficult to control. Running 140-170 systolic.  No sob or doe  Fasting glucose on BMP has been elevated 139-182    Past Medical History:  Diagnosis Date   AAA (abdominal aortic aneurysm) (HCC)    asymptomatic   AAA (abdominal aortic aneurysm) (HCC) 2010   peripheral  angiogram-- bilateral SFA DISEASE  and 60 to 70% infrarenaal abd. aortic stenosis with 15 -mm gradient   Adenomatous colon polyp 11/2003   CAD (coronary artery disease)    Gait abnormality 02/13/2017   Gastroparesis    pt denies   GERD (gastroesophageal reflux disease)    w/ LPR   History of kidney stones    x2   Hyperlipidemia    Hypertension    IDA (iron   deficiency anemia)    Peripheral arterial disease (HCC)    RCEA  by Dr Medford Brunswick   PUD (peptic ulcer disease)    pt unaware   Vertigo    Past Surgical History:  Procedure Laterality Date   ABDOMINAL AORTOGRAM N/A 11/19/2023   Procedure: ABDOMINAL AORTOGRAM;  Surgeon: Sheree Penne Bruckner, MD;  Location: [facility] INVASIVE CV LAB;  Service: Cardiovascular;  Laterality: N/A;   ABDOMINAL AORTOGRAM W/LOWER EXTREMITY Bilateral 08/05/2018   Procedure: ABDOMINAL AORTOGRAM W/LOWER EXTREMITY;  Surgeon: Court Dorn PARAS, MD;  Location: [facility] INVASIVE CV LAB;  Service: Cardiovascular;  Laterality: Bilateral;   ABDOMINAL AORTOGRAM W/LOWER EXTREMITY N/A 09/26/2021   Procedure: ABDOMINAL AORTOGRAM W/LOWER EXTREMITY;  Surgeon: Court Dorn PARAS, MD;  Location: [facility] INVASIVE CV LAB;  Service: Cardiovascular;  Laterality: N/A;   AORTOGRAM  04/24/2016    Abdominal aortogram, bilateral iliac angiogram, bifemoral runoff   CARDIAC CATHETERIZATION  05/12/2005   RCA   carotid doppler  01/24/2013   RICA endarterectomy,left CCA 0-49%; left bulb and prox ICA 50-69%; bilaateral subclavian < 50%   CAROTID ENDARTERECTOMY  11/01/2010   CATARACT EXTRACTION     COLONOSCOPY     CORONARY ANGIOPLASTY  05/18/2005   2 STENTS distal RCA AND PROXIMAL-MID RCA   5 total stents per pt.   CYSTOSCOPY/URETEROSCOPY/HOLMIUM LASER/STENT PLACEMENT Left 01/11/2018   Procedure: LEFT URETEROSCOPY/HOLMIUM LASER/STENT PLACEMENT;  Surgeon: Cam Morene ORN, MD;  Location: [facility] ORS;  Service: Urology;  Laterality: Left;   DOPPLER ECHOCARDIOGRAPHY  02/07/2012   EF 55%,SHOWED NO ISCHEMIA    HERNIA REPAIR     umbilical   lower arterial  doppler  02/04/2013   aotra 1.5 x 1.5 cm; distal abd aorta 70-99%,proximal common iliac arteries -very stenotic with increased velocities>50%,may be falsely elevated as a result of residual plaque from the distal aorta stenosis   LOWER EXTREMITY ANGIOGRAPHY N/A 11/19/2023   Procedure: Lower Extremity Angiography;   Surgeon: Sheree Penne Bruckner, MD;  Location: [facility] INVASIVE CV LAB;  Service: Cardiovascular;  Laterality: N/A;   lower extremity doppler  June 18 ,2013   ABI'S ABNORMAL, RABI was 0.88 and LABI 0.75 ,with 3-vessel  run off   NM MYOVIEW  LTD  MAY 23,2011   showed no significant ischemia;   NM MYOVIEW  LTD  04/22/2008   ef 77%,exercise capcity 6 METS ,exaggerated blood pressure response to exercise   PERIPHERAL VASCULAR CATHETERIZATION N/A 04/24/2016   Procedure: Lower Extremity Angiography;  Surgeon: Dorn JINNY Lesches, MD;  Location: [facility] INVASIVE CV LAB;  Service: Cardiovascular;  Laterality: N/A;   PERIPHERAL VASCULAR CATHETERIZATION  04/24/2016   Procedure: Peripheral Vascular Intervention;  Surgeon: Dorn JINNY Lesches, MD;  Location: [facility] INVASIVE CV LAB;  Service: Cardiovascular;;  Aorta   retrograde central aortic catheterization  05/19/2005   cutting balloon atherectomy, c-circ stenosis with DES STENTING CYPHER   TONSILLECTOMY     TOTAL HIP ARTHROPLASTY Right 07/11/2022   Procedure: TOTAL HIP ARTHROPLASTY ANTERIOR APPROACH;  Surgeon: Ernie Cough, MD;  Location: [facility] OR[facility];  Service: Orthopedics;  Laterality: Right;   TOTAL KNEE ARTHROPLASTY Left 01/03/2019   Procedure: TOTAL KNEE ARTHROPLASTY;  Surgeon: Kay Kemps, MD;  Location: [facility] OR[facility];  Service: Orthopedics;  Laterality: Left;  with IS block   TRANSURETHRAL RESECTION OF PROSTATE N/A 09/08/2016   Procedure: TRANSURETHRAL RESECTION OF THE PROSTATE (TURP);  Surgeon: Morene ORN Cam, MD;  Location: [facility];  Service: Urology;  Laterality: N/A;   TRANSURETHRAL RESECTION OF PROSTATE     10-10-17  Dr. Cam   TRANSURETHRAL RESECTION OF PROSTATE N/A 10/10/2017   Procedure: TRANSURETHRAL RESECTION OF THE PROSTATE (TURP);  Surgeon: Cam Morene ORN, MD;  Location: [facility] OR[facility];  Service: Urology;  Laterality: N/A;   VASECTOMY      Allergies  Allergen Reactions   Lisinopril Cough   Niaspan [Niacin Er (Antihyperlipidemic)] Rash    Outpatient  Encounter Medications as of 02/07/2024  Medication Sig   acetaminophen  (TYLENOL ) 500 MG tablet Take 1,000 mg by mouth 2 (two) times daily.   amLODipine  (NORVASC ) 10 MG tablet Take 10 mg by mouth in the morning. Hold if BP <95   apixaban  (ELIQUIS ) 5 MG TABS tablet Take 1 tablet (5 mg total) by mouth 2 (two) times daily.   Apoaequorin (PREVAGEN PO) Take 1 tablet by mouth in the morning.   atorvastatin  (LIPITOR) 40 MG tablet Take 40 mg by mouth daily at 6 PM.    Biotin  10 MG TABS Take 10 mg by mouth in the morning. Once a morning 8am-11am   cetirizine  (ZYRTEC ) 10 MG tablet Take 1 tablet (10 mg total) by mouth at bedtime.   cholecalciferol  (VITAMIN D3) 25 MCG (1000 UT) tablet Take 1,000 Units by mouth in the morning.   cloNIDine  (CATAPRES ) 0.1 MG tablet Take 1 tablet (0.1 mg total) by mouth 3 (three) times daily. Hold if BP < 95   finasteride  (PROSCAR ) 5 MG tablet  Take 5 mg by mouth in the morning.   furosemide  (LASIX ) 20 MG tablet Take 20 mg by mouth daily as needed (HOLD UNTIL 01/28/2024).   furosemide  (LASIX ) 40 MG tablet Take 1 tablet (40 mg total) by mouth daily. HOLD UNTIL 01/28/2024   gabapentin  (NEURONTIN ) 100 MG capsule Take 200 mg by mouth 3 (three) times daily.   Glucosamine-Chondroitin 750-600 MG TABS Take 1 tablet by mouth 2 (two) times daily.   hydrALAZINE  (APRESOLINE ) 25 MG tablet Take 25 mg by mouth in the morning and at bedtime. Hold if BP < 95   hydrALAZINE  (APRESOLINE ) 50 MG tablet Take 50 mg by mouth daily at 4 PM. Hold if BP <95   iron  polysaccharides (NIFEREX) 150 MG capsule Take 150 mg by mouth every Monday, Wednesday, and Friday.   losartan  (COZAAR ) 50 MG tablet Take 50 mg by mouth daily.   melatonin 5 MG TABS Take 1 tablet (5 mg total) by mouth at bedtime.   metoprolol  tartrate (LOPRESSOR ) 50 MG tablet Take 75 mg by mouth 2 (two) times daily.   Multiple Vitamin (MULTIVITAMIN WITH MINERALS) TABS tablet Take 1 tablet by mouth in the morning.   pantoprazole  (PROTONIX ) 40 MG  tablet Take 40 mg by mouth 2 (two) times daily.   potassium chloride  SA (K-DUR,KLOR-CON ) 20 MEQ tablet Take 20 mEq by mouth every evening.    tamsulosin  (FLOMAX ) 0.4 MG CAPS capsule Take 1 capsule (0.4 mg total) by mouth daily after supper.   doxycycline (MONODOX) 100 MG capsule Take 100 mg by mouth 2 (two) times daily. (Patient not taking: Reported on 02/07/2024)   No facility-administered encounter medications on file as of 02/07/2024.    Review of Systems  Constitutional:  Positive for activity change. Negative for appetite change, chills, diaphoresis, fatigue, fever and unexpected weight change.  Respiratory:  Negative for cough, shortness of breath, wheezing and stridor.   Cardiovascular:  Positive for leg swelling. Negative for chest pain and palpitations.  Gastrointestinal:  Negative for abdominal distention, abdominal pain, constipation and diarrhea.  Genitourinary:  Negative for difficulty urinating and dysuria.  Musculoskeletal:  Positive for arthralgias and gait problem. Negative for back pain, joint swelling and myalgias.  Neurological:  Negative for dizziness, seizures, syncope, facial asymmetry, speech difficulty, weakness and headaches.  Hematological:  Negative for adenopathy. Does not bruise/bleed easily.  Psychiatric/Behavioral:  Positive for confusion. Negative for agitation and behavioral problems.     Immunization History  Administered Date(s) Administered   Fluad  Quad(high Dose 65+) 05/02/2022, 05/22/2023   Influenza, High Dose Seasonal PF 03/31/2017, 03/29/2018   Influenza,inj,Quad PF,6+ Mos 04/25/2016, 05/06/2018   Influenza,inj,quad, With Preservative 05/26/2019   Influenza-Unspecified 05/14/2001, 05/31/2001, 05/14/2002, 05/15/2002, 05/18/2003, 05/25/2004, 04/30/2005, 05/29/2006, 05/01/2007, 05/11/2008, 05/31/2009, 05/31/2010, 04/30/2013, 05/31/2014, 03/31/2021   Moderna Covid-19 Vaccine Bivalent Booster 62yrs & up 08/31/2021, 05/22/2023   Moderna SARS-COV2 Booster  Vaccination 06/15/2020, 11/19/2020, 01/02/2022   Moderna Sars-Covid-2 Vaccination 08/12/2019, 09/10/2019, 05/31/2022   Pfizer Covid-19 Vaccine Bivalent Booster 26yrs & up 11/23/2022   Pneumococcal Polysaccharide-23 05/02/2000, 09/24/2018   Pneumococcal-Unspecified 01/12/2024   Rsv, Bivalent, Protein Subunit Rsvpref,pf [place]) 07/19/2022   Td 09/08/2013   Tetanus 01/25/2024   Zoster Recombinant(Shingrix) 10/04/2017, 01/23/2018   Pertinent  Health Maintenance Due  Topic Date Due   INFLUENZA VACCINE  02/29/2024      07/28/2022    9:00 AM 10/14/2022    7:21 AM 11/28/2022    2:50 PM 06/04/2023    4:59 PM 01/11/2024   11:18 AM  Fall Risk  Falls  in the past year?  1 1 0 1  Was there an injury with Fall?  1 0 0 1  Fall Risk Category Calculator  3 1 0 2  (RETIRED) Patient Fall Risk Level High fall risk       Patient at Risk for Falls Due to   History of fall(s);Impaired balance/gait;Impaired mobility No Fall Risks History of fall(s)  Fall risk Follow up  Falls evaluation completed Falls evaluation completed;Education provided;Falls prevention discussed Falls evaluation completed Falls evaluation completed     Data saved with a previous flowsheet row definition   Functional Status Survey:    Vitals:   02/07/24 1016  BP: (!) 140/69  Pulse: 71  Resp: 14  Temp: 98.4 F (36.9 C)  SpO2: 94%  Weight: 188 lb 3.2 oz (85.4 kg)  Height: 5' 4 (1.626 m)   Body mass index is 32.3 kg/m. Physical Exam Vitals and nursing note reviewed.  Constitutional:      Appearance: Normal appearance.  HENT:     Head: Normocephalic and atraumatic.  Cardiovascular:     Rate and Rhythm: Normal rate and regular rhythm.     Heart sounds: No murmur heard. Pulmonary:     Effort: Pulmonary effort is normal. No respiratory distress.     Breath sounds: Normal breath sounds. No wheezing.  Abdominal:     General: Bowel sounds are normal. There is no distension.     Palpations: Abdomen is soft.      Tenderness: There is no abdominal tenderness.  Musculoskeletal:     Cervical back: No rigidity.     Right lower leg: Edema (+1) present.     Left lower leg: Edema (+1) present.  Lymphadenopathy:     Cervical: No cervical adenopathy.  Skin:    General: Skin is warm and dry.  Neurological:     Mental Status: He is alert. Mental status is at baseline.  Psychiatric:        Mood and Affect: Mood normal.     Labs reviewed: Recent Labs    09/28/23 0236 09/29/23 0752 09/30/23 0231 10/01/23 0234 11/19/23 1128 12/18/23 0000 01/22/24 0000 01/28/24 0000 02/04/24 0000  NA 142   < > 142 140 144   < > 141 140 143  K 3.7   < > 3.4* 3.6 4.7   < > 3.9 4.3 4.2  CL 106   < > 107 106 108   < > 104 105 104  CO2 26   < > 24 24  --    < > 26* 21 23*  GLUCOSE 120*   < > 111* 102* 136*  --   --   --   --   BUN 49*   < > 37* 32* 29*   < > 63* 58* 40*  CREATININE 1.99*   < > 1.37* 1.16 1.20   < > 2.3* 1.8* 1.3  CALCIUM  8.7*   < > 8.8* 8.8*  --    < > 9.1 9.5 9.3  MG 2.1  --   --  2.2  --   --   --   --   --    < > = values in this interval not displayed.   Recent Labs    06/18/23 0000 01/22/24 0000  AST 16 14  ALT 15 16  ALKPHOS 91 96  ALBUMIN 3.9 3.9   Recent Labs    09/25/23 2212 09/27/23 0228 09/29/23 0752 09/30/23 0231 11/19/23 1128 01/22/24 0000  WBC  7.4 9.3 9.4 7.1  --  9.1  NEUTROABS 5.6  --   --   --   --   --   HGB 11.7* 10.3* 11.1* 10.0* 12.2* 12.4*  HCT 36.7* 31.0* 33.3* 30.9* 36.0* 36*  MCV 91.5 88.3 88.6 90.4  --   --   PLT 181 187 185 184  --  211   Lab Results  Component Value Date   TSH 6.02 (A) 06/18/2023   Lab Results  Component Value Date   HGBA1C 5.5 11/23/2016   Lab Results  Component Value Date   CHOL 133 10/17/2022   HDL 49 10/17/2022   LDLCALC 65 10/17/2022   TRIG 122 10/17/2022   CHOLHDL 2.4 11/23/2016    Significant Diagnostic Results in last 30 days:  No results found.  Assessment/Plan  1. Type 2 diabetes mellitus with stage 3b  chronic kidney disease, without long-term current use of insulin (HCC) (Primary) Discussed dietary management Check A1c and lipid CBGs fasting twice weekly  2. Essential (primary) hypertension Increase hydralazine  to 50 mg qid  3. CKD stage 3 Improved off chlorthalidone  and with reduction in losartan  Lethargy also improved Edema improved.      Family/ staff Communication: left message for his wife  Labs/tests ordered:  Lipid and A1C

## 2024-02-08 ENCOUNTER — Encounter: Payer: Self-pay | Admitting: Adult Health

## 2024-02-08 DIAGNOSIS — R278 Other lack of coordination: Secondary | ICD-10-CM | POA: Diagnosis not present

## 2024-02-08 DIAGNOSIS — R296 Repeated falls: Secondary | ICD-10-CM | POA: Diagnosis not present

## 2024-02-08 DIAGNOSIS — E1122 Type 2 diabetes mellitus with diabetic chronic kidney disease: Secondary | ICD-10-CM | POA: Insufficient documentation

## 2024-02-11 DIAGNOSIS — M1712 Unilateral primary osteoarthritis, left knee: Secondary | ICD-10-CM | POA: Diagnosis not present

## 2024-02-11 DIAGNOSIS — M6281 Muscle weakness (generalized): Secondary | ICD-10-CM | POA: Diagnosis not present

## 2024-02-11 DIAGNOSIS — E785 Hyperlipidemia, unspecified: Secondary | ICD-10-CM | POA: Diagnosis not present

## 2024-02-11 DIAGNOSIS — R278 Other lack of coordination: Secondary | ICD-10-CM | POA: Diagnosis not present

## 2024-02-11 DIAGNOSIS — R296 Repeated falls: Secondary | ICD-10-CM | POA: Diagnosis not present

## 2024-02-11 DIAGNOSIS — R2689 Other abnormalities of gait and mobility: Secondary | ICD-10-CM | POA: Diagnosis not present

## 2024-02-11 DIAGNOSIS — E08319 Diabetes mellitus due to underlying condition with unspecified diabetic retinopathy without macular edema: Secondary | ICD-10-CM | POA: Diagnosis not present

## 2024-02-11 LAB — HEMOGLOBIN A1C: Hemoglobin A1C: 7.9

## 2024-02-11 LAB — LIPID PANEL
Cholesterol: 134 (ref 0–200)
HDL: 38 (ref 35–70)
LDL Cholesterol: 59
LDl/HDL Ratio: 3.6
Triglycerides: 185 — AB (ref 40–160)

## 2024-02-14 DIAGNOSIS — M6281 Muscle weakness (generalized): Secondary | ICD-10-CM | POA: Diagnosis not present

## 2024-02-14 DIAGNOSIS — M1712 Unilateral primary osteoarthritis, left knee: Secondary | ICD-10-CM | POA: Diagnosis not present

## 2024-02-14 DIAGNOSIS — R2689 Other abnormalities of gait and mobility: Secondary | ICD-10-CM | POA: Diagnosis not present

## 2024-02-18 ENCOUNTER — Encounter: Payer: Self-pay | Admitting: Adult Health

## 2024-02-18 ENCOUNTER — Non-Acute Institutional Stay (SKILLED_NURSING_FACILITY): Admitting: Adult Health

## 2024-02-18 DIAGNOSIS — E1122 Type 2 diabetes mellitus with diabetic chronic kidney disease: Secondary | ICD-10-CM

## 2024-02-18 DIAGNOSIS — R339 Retention of urine, unspecified: Secondary | ICD-10-CM | POA: Diagnosis not present

## 2024-02-18 DIAGNOSIS — N1832 Chronic kidney disease, stage 3b: Secondary | ICD-10-CM

## 2024-02-18 DIAGNOSIS — R8281 Pyuria: Secondary | ICD-10-CM

## 2024-02-18 NOTE — Progress Notes (Unsigned)
 Location:  [facility] Room Number: 149 P Place of Service:  SNF [phone]) Provider:  Tawni America, NP    Patient Care Team: Charlanne Fredia CROME, MD as PCP - General (Internal Medicine) Court Dorn PARAS, MD as PCP - Cardiology (Cardiology)  Extended Emergency Contact Information Primary Emergency Contact: Christyne Madeline LABOR Address: [address]          [place], [place] [address] [place] Home Phone: [phone] Mobile Phone: [phone] Relation: Spouse  Code Status:  DNR Goals of care: Advanced Directive information    01/11/2024   11:18 AM  Advanced Directives  Does Patient Have a Medical Advance Directive? Yes  Type of Estate agent of [place];Living will;Out of facility DNR (pink MOST or yellow form)  Copy of Healthcare Power of Attorney in Chart? Yes - validated most recent copy scanned in chart (See row information)     Chief Complaint  Patient presents with   Urinary Retention    HPI:  Pt is a 89 y.o. male seen today for an acute visit for urinary retention  The nurse reports that he has green and yellow drainage in his urine x 1 day Also having retention. Bladder scanned due to decreased output 239 and 624 I and O cath x 1 with output of 900 Pt reported some bladder pain. No dysuria, back pain or fever Recent new DX of DM II. A1C 7.9 Hx of BPH on proscar  and flomax . Is followed by urology    Past Medical History:  Diagnosis Date   AAA (abdominal aortic aneurysm) (HCC)    asymptomatic   AAA (abdominal aortic aneurysm) (HCC) 2010   peripheral  angiogram-- bilateral SFA DISEASE  and 60 to 70% infrarenaal abd. aortic stenosis with 15 -mm gradient   Adenomatous colon polyp 11/2003   CAD (coronary artery disease)    Gait abnormality 02/13/2017   Gastroparesis    pt denies   GERD (gastroesophageal reflux disease)    w/ LPR   History of kidney stones    x2   Hyperlipidemia     Hypertension    IDA (iron  deficiency anemia)    Peripheral arterial disease (HCC)    RCEA  by Dr Medford Brunswick   PUD (peptic ulcer disease)    pt unaware   Vertigo    Past Surgical History:  Procedure Laterality Date   ABDOMINAL AORTOGRAM N/A 11/19/2023   Procedure: ABDOMINAL AORTOGRAM;  Surgeon: Sheree Penne Bruckner, MD;  Location: [facility] INVASIVE CV LAB;  Service: Cardiovascular;  Laterality: N/A;   ABDOMINAL AORTOGRAM W/LOWER EXTREMITY Bilateral 08/05/2018   Procedure: ABDOMINAL AORTOGRAM W/LOWER EXTREMITY;  Surgeon: Court Dorn PARAS, MD;  Location: [facility] INVASIVE CV LAB;  Service: Cardiovascular;  Laterality: Bilateral;   ABDOMINAL AORTOGRAM W/LOWER EXTREMITY N/A 09/26/2021   Procedure: ABDOMINAL AORTOGRAM W/LOWER EXTREMITY;  Surgeon: Court Dorn PARAS, MD;  Location: [facility] INVASIVE CV LAB;  Service: Cardiovascular;  Laterality: N/A;   AORTOGRAM  04/24/2016    Abdominal aortogram, bilateral iliac angiogram, bifemoral runoff   CARDIAC CATHETERIZATION  05/12/2005   RCA   carotid doppler  01/24/2013   RICA endarterectomy,left CCA 0-49%; left bulb and prox ICA 50-69%; bilaateral subclavian < 50%   CAROTID ENDARTERECTOMY  11/01/2010   CATARACT EXTRACTION     COLONOSCOPY     CORONARY ANGIOPLASTY  05/18/2005   2 STENTS distal RCA AND PROXIMAL-MID RCA   5 total stents per pt.   CYSTOSCOPY/URETEROSCOPY/HOLMIUM LASER/STENT PLACEMENT Left 01/11/2018   Procedure:  LEFT URETEROSCOPY/HOLMIUM LASER/STENT PLACEMENT;  Surgeon: Cam Morene ORN, MD;  Location: [facility] ORS;  Service: Urology;  Laterality: Left;   DOPPLER ECHOCARDIOGRAPHY  02/07/2012   EF 55%,SHOWED NO ISCHEMIA    HERNIA REPAIR     umbilical   lower arterial  doppler  02/04/2013   aotra 1.5 x 1.5 cm; distal abd aorta 70-99%,proximal common iliac arteries -very stenotic with increased velocities>50%,may be falsely elevated as a result of residual plaque from the distal aorta stenosis   LOWER EXTREMITY ANGIOGRAPHY N/A 11/19/2023   Procedure:  Lower Extremity Angiography;  Surgeon: Sheree Penne Bruckner, MD;  Location: [facility] INVASIVE CV LAB;  Service: Cardiovascular;  Laterality: N/A;   lower extremity doppler  June 18 ,2013   ABI'S ABNORMAL, RABI was 0.88 and LABI 0.75 ,with 3-vessel  run off   NM MYOVIEW  LTD  MAY 23,2011   showed no significant ischemia;   NM MYOVIEW  LTD  04/22/2008   ef 77%,exercise capcity 6 METS ,exaggerated blood pressure response to exercise   PERIPHERAL VASCULAR CATHETERIZATION N/A 04/24/2016   Procedure: Lower Extremity Angiography;  Surgeon: Dorn JINNY Lesches, MD;  Location: [facility] INVASIVE CV LAB;  Service: Cardiovascular;  Laterality: N/A;   PERIPHERAL VASCULAR CATHETERIZATION  04/24/2016   Procedure: Peripheral Vascular Intervention;  Surgeon: Dorn JINNY Lesches, MD;  Location: [facility] INVASIVE CV LAB;  Service: Cardiovascular;;  Aorta   retrograde central aortic catheterization  05/19/2005   cutting balloon atherectomy, c-circ stenosis with DES STENTING CYPHER   TONSILLECTOMY     TOTAL HIP ARTHROPLASTY Right 07/11/2022   Procedure: TOTAL HIP ARTHROPLASTY ANTERIOR APPROACH;  Surgeon: Ernie Cough, MD;  Location: [facility] OR[facility];  Service: Orthopedics;  Laterality: Right;   TOTAL KNEE ARTHROPLASTY Left 01/03/2019   Procedure: TOTAL KNEE ARTHROPLASTY;  Surgeon: Kay Kemps, MD;  Location: [facility] OR[facility];  Service: Orthopedics;  Laterality: Left;  with IS block   TRANSURETHRAL RESECTION OF PROSTATE N/A 09/08/2016   Procedure: TRANSURETHRAL RESECTION OF THE PROSTATE (TURP);  Surgeon: Morene ORN Cam, MD;  Location: [facility];  Service: Urology;  Laterality: N/A;   TRANSURETHRAL RESECTION OF PROSTATE     10-10-17  Dr. Cam   TRANSURETHRAL RESECTION OF PROSTATE N/A 10/10/2017   Procedure: TRANSURETHRAL RESECTION OF THE PROSTATE (TURP);  Surgeon: Cam Morene ORN, MD;  Location: [facility] OR[facility];  Service: Urology;  Laterality: N/A;   VASECTOMY      Allergies  Allergen Reactions   Lisinopril Cough   Niaspan [Niacin Er (Antihyperlipidemic)]  Rash    Outpatient Encounter Medications as of 02/18/2024  Medication Sig   acetaminophen  (TYLENOL ) 500 MG tablet Take 1,000 mg by mouth 2 (two) times daily.   amLODipine  (NORVASC ) 10 MG tablet Take 10 mg by mouth in the morning. Hold if BP <95   apixaban  (ELIQUIS ) 5 MG TABS tablet Take 1 tablet (5 mg total) by mouth 2 (two) times daily.   Apoaequorin (PREVAGEN PO) Take 1 tablet by mouth in the morning.   atorvastatin  (LIPITOR) 40 MG tablet Take 40 mg by mouth daily at 6 PM.    Biotin  10 MG TABS Take 10 mg by mouth in the morning. Once a morning 8am-11am   cetirizine  (ZYRTEC ) 10 MG tablet Take 1 tablet (10 mg total) by mouth at bedtime.   cholecalciferol  (VITAMIN D3) 25 MCG (1000 UT) tablet Take 1,000 Units by mouth in the morning.   cloNIDine  (CATAPRES ) 0.1 MG tablet Take 1 tablet (0.1 mg total) by mouth 3 (three) times daily. Hold if BP < 95  finasteride  (PROSCAR ) 5 MG tablet Take 5 mg by mouth in the morning.   furosemide  (LASIX ) 20 MG tablet Take 20 mg by mouth daily as needed (HOLD UNTIL 01/28/2024).   furosemide  (LASIX ) 40 MG tablet Take 1 tablet (40 mg total) by mouth daily. HOLD UNTIL 01/28/2024   gabapentin  (NEURONTIN ) 100 MG capsule Take 200 mg by mouth 3 (three) times daily.   Glucosamine-Chondroitin 750-600 MG TABS Take 1 tablet by mouth 2 (two) times daily.   hydrALAZINE  (APRESOLINE ) 50 MG tablet Take 50 mg by mouth 4 (four) times daily. Hold if BP <95   iron  polysaccharides (NIFEREX) 150 MG capsule Take 150 mg by mouth every Monday, Wednesday, and Friday.   losartan  (COZAAR ) 50 MG tablet Take 50 mg by mouth daily.   melatonin 5 MG TABS Take 1 tablet (5 mg total) by mouth at bedtime.   metoprolol  tartrate (LOPRESSOR ) 50 MG tablet Take 75 mg by mouth 2 (two) times daily.   Multiple Vitamin (MULTIVITAMIN WITH MINERALS) TABS tablet Take 1 tablet by mouth in the morning.   pantoprazole  (PROTONIX ) 40 MG tablet Take 40 mg by mouth 2 (two) times daily.   potassium chloride  SA  (K-DUR,KLOR-CON ) 20 MEQ tablet Take 20 mEq by mouth every evening.    tamsulosin  (FLOMAX ) 0.4 MG CAPS capsule Take 1 capsule (0.4 mg total) by mouth daily after supper.   No facility-administered encounter medications on file as of 02/18/2024.    Review of Systems  Constitutional:  Negative for activity change, appetite change, chills, diaphoresis, fatigue and fever.  HENT:  Negative for congestion.   Respiratory:  Negative for cough, shortness of breath and wheezing.   Cardiovascular:  Positive for leg swelling. Negative for chest pain.  Gastrointestinal:  Negative for abdominal distention, abdominal pain, constipation, diarrhea, nausea and vomiting.  Genitourinary:  Positive for decreased urine volume and difficulty urinating. Negative for dysuria, flank pain, frequency, hematuria, penile discharge, penile pain, penile swelling, scrotal swelling and urgency.  Musculoskeletal:  Positive for gait problem. Negative for back pain, myalgias and neck pain.  Skin:  Negative for rash.  Neurological:  Positive for numbness. Negative for dizziness and weakness.  Psychiatric/Behavioral:  Negative for confusion.        Memory loss    Immunization History  Administered Date(s) Administered    sv, Bivalent, Protein Subunit Rsvpref,pf (Abrysvo) 07/19/2022   Fluad  Quad(high Dose 65+) 05/02/2022, 05/22/2023   Influenza, High Dose Seasonal PF 03/31/2017, 03/29/2018   Influenza,inj,Quad PF,6+ Mos 04/25/2016, 05/06/2018   Influenza,inj,quad, With Preservative 05/26/2019   Influenza-Unspecified 05/14/2001, 05/31/2001, 05/14/2002, 05/15/2002, 05/18/2003, 05/25/2004, 04/30/2005, 05/29/2006, 05/01/2007, 05/11/2008, 05/31/2009, 05/31/2010, 04/30/2013, 05/31/2014, 03/31/2021   Moderna Covid-19 Vaccine Bivalent Booster 58yrs & up 08/31/2021, 05/22/2023   Moderna SARS-COV2 Booster Vaccination 06/15/2020, 11/19/2020, 01/02/2022   Moderna Sars-Covid-2 Vaccination 08/12/2019, 09/10/2019, 05/31/2022   Pfizer  Covid-19 Vaccine Bivalent Booster 80yrs & up 11/23/2022   Pneumococcal Polysaccharide-23 05/02/2000, 09/24/2018   Pneumococcal-Unspecified 01/12/2024   Td 09/08/2013   Tetanus 01/25/2024   Zoster Recombinant(Shingrix) 10/04/2017, 01/23/2018   Pertinent  Health Maintenance Due  Topic Date Due   FOOT EXAM  Never done   OPHTHALMOLOGY EXAM  Never done   INFLUENZA VACCINE  02/29/2024   HEMOGLOBIN A1C  08/13/2024      07/28/2022    9:00 AM 10/14/2022    7:21 AM 11/28/2022    2:50 PM 06/04/2023    4:59 PM 01/11/2024   11:18 AM  Fall Risk  Falls in the past year?  1 1  0 1  Was there an injury with Fall?  1 0 0 1  Fall Risk Category Calculator  3 1 0 2  (RETIRED) Patient Fall Risk Level High fall risk       Patient at Risk for Falls Due to   History of fall(s);Impaired balance/gait;Impaired mobility No Fall Risks History of fall(s)  Fall risk Follow up  Falls evaluation completed Falls evaluation completed;Education provided;Falls prevention discussed Falls evaluation completed Falls evaluation completed     Data saved with a previous flowsheet row definition   Functional Status Survey:    Vitals:   02/18/24 0856  BP: (!) 146/61  Pulse: 71  Resp: 18  Temp: 98.1 F (36.7 C)  SpO2: 92%  Weight: 190 lb 9.6 oz (86.5 kg)  Height: 5' 4 (1.626 m)   Body mass index is 32.72 kg/m. Physical Exam Vitals and nursing note reviewed.  Constitutional:      Appearance: Normal appearance.  HENT:     Head: Normocephalic and atraumatic.  Cardiovascular:     Rate and Rhythm: Normal rate and regular rhythm.     Heart sounds: No murmur heard. Pulmonary:     Effort: Pulmonary effort is normal. No respiratory distress.     Breath sounds: Normal breath sounds. No wheezing.  Abdominal:     General: Bowel sounds are normal. There is distension.     Palpations: Abdomen is soft.     Tenderness: There is no abdominal tenderness. There is no right CVA tenderness or left CVA tenderness.   Musculoskeletal:     Cervical back: Normal range of motion. No rigidity.     Comments: BLE edema +2  Lymphadenopathy:     Cervical: No cervical adenopathy.  Skin:    General: Skin is warm and dry.  Neurological:     Mental Status: He is alert. Mental status is at baseline.  Psychiatric:        Mood and Affect: Mood normal.     Labs reviewed: Recent Labs    09/28/23 0236 09/29/23 0752 09/30/23 0231 10/01/23 0234 11/19/23 1128 12/18/23 0000 01/22/24 0000 01/28/24 0000 02/04/24 0000  NA 142   < > 142 140 144   < > 141 140 143  K 3.7   < > 3.4* 3.6 4.7   < > 3.9 4.3 4.2  CL 106   < > 107 106 108   < > 104 105 104  CO2 26   < > 24 24  --    < > 26* 21 23*  GLUCOSE 120*   < > 111* 102* 136*  --   --   --   --   BUN 49*   < > 37* 32* 29*   < > 63* 58* 40*  CREATININE 1.99*   < > 1.37* 1.16 1.20   < > 2.3* 1.8* 1.3  CALCIUM  8.7*   < > 8.8* 8.8*  --    < > 9.1 9.5 9.3  MG 2.1  --   --  2.2  --   --   --   --   --    < > = values in this interval not displayed.   Recent Labs    06/18/23 0000 01/22/24 0000  AST 16 14  ALT 15 16  ALKPHOS 91 96  ALBUMIN 3.9 3.9   Recent Labs    09/25/23 2212 09/27/23 0228 09/29/23 0752 09/30/23 0231 11/19/23 1128 01/22/24 0000  WBC 7.4 9.3 9.4 7.1  --  9.1  NEUTROABS 5.6  --   --   --   --   --   HGB 11.7* 10.3* 11.1* 10.0* 12.2* 12.4*  HCT 36.7* 31.0* 33.3* 30.9* 36.0* 36*  MCV 91.5 88.3 88.6 90.4  --   --   PLT 181 187 185 184  --  211   Lab Results  Component Value Date   TSH 6.02 (A) 06/18/2023   Lab Results  Component Value Date   HGBA1C 7.9 02/11/2024   Lab Results  Component Value Date   CHOL 134 02/11/2024   HDL 38 02/11/2024   LDLCALC 59 02/11/2024   TRIG 185 (A) 02/11/2024   CHOLHDL 2.4 11/23/2016    Significant Diagnostic Results in last 30 days:  No results found.  Assessment/Plan  1. Urine purulent (Primary) UA C and S   2. Urinary retention Initiate urinary retention protocol per [facility].    3. Type 2 DM Discussed diagnosis with pt and his wife They have never watched their intake of sugar and carbs but will begin to do so and we will check his A1C in 3 months. Discussed risk of recurrent infections.    Family/ staff Communication: discussed with his wife   Labs/tests ordered:  UA C and S

## 2024-02-19 ENCOUNTER — Encounter: Payer: Self-pay | Admitting: Adult Health

## 2024-02-19 DIAGNOSIS — N390 Urinary tract infection, site not specified: Secondary | ICD-10-CM | POA: Diagnosis not present

## 2024-02-19 DIAGNOSIS — M1712 Unilateral primary osteoarthritis, left knee: Secondary | ICD-10-CM | POA: Diagnosis not present

## 2024-02-19 DIAGNOSIS — M6281 Muscle weakness (generalized): Secondary | ICD-10-CM | POA: Diagnosis not present

## 2024-02-19 DIAGNOSIS — R2689 Other abnormalities of gait and mobility: Secondary | ICD-10-CM | POA: Diagnosis not present

## 2024-02-20 ENCOUNTER — Encounter: Payer: Self-pay | Admitting: Internal Medicine

## 2024-02-20 ENCOUNTER — Non-Acute Institutional Stay (SKILLED_NURSING_FACILITY): Admitting: Internal Medicine

## 2024-02-20 DIAGNOSIS — R339 Retention of urine, unspecified: Secondary | ICD-10-CM | POA: Diagnosis not present

## 2024-02-20 DIAGNOSIS — R140 Abdominal distension (gaseous): Secondary | ICD-10-CM | POA: Diagnosis not present

## 2024-02-20 DIAGNOSIS — K5901 Slow transit constipation: Secondary | ICD-10-CM | POA: Diagnosis not present

## 2024-02-20 DIAGNOSIS — R296 Repeated falls: Secondary | ICD-10-CM | POA: Diagnosis not present

## 2024-02-20 DIAGNOSIS — R278 Other lack of coordination: Secondary | ICD-10-CM | POA: Diagnosis not present

## 2024-02-20 NOTE — Progress Notes (Unsigned)
 Location:  [facility] Room Number: /149/A Place of Service:  SNF [phone]) Provider:  Charlanne Fredia CROME, MD  Patient Care Team: Charlanne Fredia CROME, MD as PCP - General (Internal Medicine) Court Dorn PARAS, MD as PCP - Cardiology (Cardiology)  Extended Emergency Contact Information Primary Emergency Contact: Christyne Madeline LABOR Address: [address]          [place], [place] [address] [place] Home Phone: [phone] Mobile Phone: [phone] Relation: Spouse  Code Status:  DNR Goals of care: Advanced Directive information    02/20/2024    2:18 PM  Advanced Directives  Does Patient Have a Medical Advance Directive? Yes  Type of Estate agent of [place];Living will;Out of facility DNR (pink MOST or yellow form)  Does patient want to make changes to medical advance directive? No - Patient declined  Copy of Healthcare Power of Attorney in Chart? Yes - validated most recent copy scanned in chart (See row information)  Pre-existing out of facility DNR order (yellow form or pink MOST form) Yellow form placed in chart (order not valid for inpatient use)     Chief Complaint  Patient presents with   Abdominal Pain    Abdominal Pain    HPI:  Pt is a 89 y.o. male seen today for an acute visit for    Past Medical History:  Diagnosis Date   AAA (abdominal aortic aneurysm) (HCC)    asymptomatic   AAA (abdominal aortic aneurysm) (HCC) 2010   peripheral  angiogram-- bilateral SFA DISEASE  and 60 to 70% infrarenaal abd. aortic stenosis with 15 -mm gradient   Adenomatous colon polyp 11/2003   CAD (coronary artery disease)    Gait abnormality 02/13/2017   Gastroparesis    pt denies   GERD (gastroesophageal reflux disease)    w/ LPR   History of kidney stones    x2   Hyperlipidemia    Hypertension    IDA (iron  deficiency anemia)    Peripheral arterial disease (HCC)    RCEA  by Dr Medford Brunswick   PUD (peptic  ulcer disease)    pt unaware   Vertigo    Past Surgical History:  Procedure Laterality Date   ABDOMINAL AORTOGRAM N/A 11/19/2023   Procedure: ABDOMINAL AORTOGRAM;  Surgeon: Sheree Penne Bruckner, MD;  Location: [facility] INVASIVE CV LAB;  Service: Cardiovascular;  Laterality: N/A;   ABDOMINAL AORTOGRAM W/LOWER EXTREMITY Bilateral 08/05/2018   Procedure: ABDOMINAL AORTOGRAM W/LOWER EXTREMITY;  Surgeon: Court Dorn PARAS, MD;  Location: [facility] INVASIVE CV LAB;  Service: Cardiovascular;  Laterality: Bilateral;   ABDOMINAL AORTOGRAM W/LOWER EXTREMITY N/A 09/26/2021   Procedure: ABDOMINAL AORTOGRAM W/LOWER EXTREMITY;  Surgeon: Court Dorn PARAS, MD;  Location: [facility] INVASIVE CV LAB;  Service: Cardiovascular;  Laterality: N/A;   AORTOGRAM  04/24/2016    Abdominal aortogram, bilateral iliac angiogram, bifemoral runoff   CARDIAC CATHETERIZATION  05/12/2005   RCA   carotid doppler  01/24/2013   RICA endarterectomy,left CCA 0-49%; left bulb and prox ICA 50-69%; bilaateral subclavian < 50%   CAROTID ENDARTERECTOMY  11/01/2010   CATARACT EXTRACTION     COLONOSCOPY     CORONARY ANGIOPLASTY  05/18/2005   2 STENTS distal RCA AND PROXIMAL-MID RCA   5 total stents per pt.   CYSTOSCOPY/URETEROSCOPY/HOLMIUM LASER/STENT PLACEMENT Left 01/11/2018   Procedure: LEFT URETEROSCOPY/HOLMIUM LASER/STENT PLACEMENT;  Surgeon: Cam Morene ORN, MD;  Location: [facility] ORS;  Service: Urology;  Laterality: Left;   DOPPLER ECHOCARDIOGRAPHY  02/07/2012  EF 55%,SHOWED NO ISCHEMIA    HERNIA REPAIR     umbilical   lower arterial  doppler  02/04/2013   aotra 1.5 x 1.5 cm; distal abd aorta 70-99%,proximal common iliac arteries -very stenotic with increased velocities>50%,may be falsely elevated as a result of residual plaque from the distal aorta stenosis   LOWER EXTREMITY ANGIOGRAPHY N/A 11/19/2023   Procedure: Lower Extremity Angiography;  Surgeon: Sheree Penne Bruckner, MD;  Location: [facility] INVASIVE CV LAB;  Service: Cardiovascular;   Laterality: N/A;   lower extremity doppler  June 18 ,2013   ABI'S ABNORMAL, RABI was 0.88 and LABI 0.75 ,with 3-vessel  run off   NM MYOVIEW  LTD  MAY 23,2011   showed no significant ischemia;   NM MYOVIEW  LTD  04/22/2008   ef 77%,exercise capcity 6 METS ,exaggerated blood pressure response to exercise   PERIPHERAL VASCULAR CATHETERIZATION N/A 04/24/2016   Procedure: Lower Extremity Angiography;  Surgeon: Dorn JINNY Lesches, MD;  Location[facility] INVASIVE CV LAB;  Service: Cardiovascular;  Laterality: N/A;   PERIPHERAL VASCULAR CATHETERIZATION  04/24/2016   Procedure: Peripheral Vascular Intervention;  Surgeon: Dorn JINNY Lesches, MD;  Location: [facility] INVASIVE CV LAB;  Service: Cardiovascular;;  Aorta   retrograde central aortic catheterization  05/19/2005   cutting balloon atherectomy, c-circ stenosis with DES STENTING CYPHER   TONSILLECTOMY     TOTAL HIP ARTHROPLASTY Right 07/11/2022   Procedure: TOTAL HIP ARTHROPLASTY ANTERIOR APPROACH;  Surgeon: Ernie Cough, MD;  Location: [facility];  Service: Orthopedics;  Laterality: Right;   TOTAL KNEE ARTHROPLASTY Left 01/03/2019   Procedure: TOTAL KNEE ARTHROPLASTY;  Surgeon: Kay Kemps, MD;  Location: [facility];  Service: Orthopedics;  Laterality: Left;  with IS block   TRANSURETHRAL RESECTION OF PROSTATE N/A 09/08/2016   Procedure: TRANSURETHRAL RESECTION OF THE PROSTATE (TURP);  Surgeon: Morene LELON Salines, MD;  Location: [facility];  Service: Urology;  Laterality: N/A;   TRANSURETHRAL RESECTION OF PROSTATE     10-10-17  Dr. Salines   TRANSURETHRAL RESECTION OF PROSTATE N/A 10/10/2017   Procedure: TRANSURETHRAL RESECTION OF THE PROSTATE (TURP);  Surgeon: Salines Morene LELON, MD;  Location: [facility];  Service: Urology;  Laterality: N/A;   VASECTOMY      Allergies  Allergen Reactions   Lisinopril Cough   Niaspan [Niacin Er (Antihyperlipidemic)] Rash    Outpatient Encounter Medications as of 02/20/2024  Medication Sig   acetaminophen  (TYLENOL ) 500 MG tablet Take 1,000  mg by mouth 2 (two) times daily.   amLODipine  (NORVASC ) 10 MG tablet Take 10 mg by mouth in the morning. Hold if BP <95   apixaban  (ELIQUIS ) 5 MG TABS tablet Take 1 tablet (5 mg total) by mouth 2 (two) times daily.   Apoaequorin (PREVAGEN PO) Take 1 tablet by mouth in the morning.   atorvastatin  (LIPITOR) 40 MG tablet Take 40 mg by mouth daily at 6 PM.    Biotin  10 MG TABS Take 10 mg by mouth in the morning. Once a morning 8am-11am   cetirizine  (ZYRTEC ) 10 MG tablet Take 1 tablet (10 mg total) by mouth at bedtime.   cholecalciferol  (VITAMIN D3) 25 MCG (1000 UT) tablet Take 1,000 Units by mouth in the morning.   cloNIDine  (CATAPRES ) 0.1 MG tablet Take 1 tablet (0.1 mg total) by mouth 3 (three) times daily. Hold if BP < 95   finasteride  (PROSCAR ) 5 MG tablet Take 5 mg by mouth in the morning.   furosemide  (LASIX ) 20 MG tablet Take 20 mg by mouth daily as needed (  HOLD UNTIL 01/28/2024).   furosemide  (LASIX ) 40 MG tablet Take 1 tablet (40 mg total) by mouth daily. HOLD UNTIL 01/28/2024   gabapentin  (NEURONTIN ) 100 MG capsule Take 200 mg by mouth 3 (three) times daily.   Glucosamine-Chondroitin 750-600 MG TABS Take 1 tablet by mouth 2 (two) times daily.   hydrALAZINE  (APRESOLINE ) 50 MG tablet Take 50 mg by mouth 4 (four) times daily. Hold if BP <95   iron  polysaccharides (NIFEREX) 150 MG capsule Take 150 mg by mouth every Monday, Wednesday, and Friday.   losartan  (COZAAR ) 50 MG tablet Take 50 mg by mouth daily.   melatonin 5 MG TABS Take 1 tablet (5 mg total) by mouth at bedtime.   metoprolol  tartrate (LOPRESSOR ) 50 MG tablet Take 75 mg by mouth 2 (two) times daily.   Multiple Vitamin (MULTIVITAMIN WITH MINERALS) TABS tablet Take 1 tablet by mouth in the morning.   pantoprazole  (PROTONIX ) 40 MG tablet Take 40 mg by mouth 2 (two) times daily.   potassium chloride  SA (K-DUR,KLOR-CON ) 20 MEQ tablet Take 20 mEq by mouth every evening.    tamsulosin  (FLOMAX ) 0.4 MG CAPS capsule Take 1 capsule (0.4 mg  total) by mouth daily after supper.   No facility-administered encounter medications on file as of 02/20/2024.    Review of Systems  Immunization History  Administered Date(s) Administered    sv, Bivalent, Protein Subunit Rsvpref,pf (Abrysvo) 07/19/2022   Fluad  Quad(high Dose 65+) 05/02/2022, 05/22/2023   Influenza, High Dose Seasonal PF 03/31/2017, 03/29/2018   Influenza,inj,Quad PF,6+ Mos 04/25/2016, 05/06/2018   Influenza,inj,quad, With Preservative 05/26/2019   Influenza-Unspecified 05/14/2001, 05/31/2001, 05/14/2002, 05/15/2002, 05/18/2003, 05/25/2004, 04/30/2005, 05/29/2006, 05/01/2007, 05/11/2008, 05/31/2009, 05/31/2010, 04/30/2013, 05/31/2014, 03/31/2021   Moderna Covid-19 Vaccine Bivalent Booster 12yrs & up 08/31/2021, 05/22/2023   Moderna SARS-COV2 Booster Vaccination 06/15/2020, 11/19/2020, 01/02/2022   Moderna Sars-Covid-2 Vaccination 08/12/2019, 09/10/2019, 05/31/2022   Pfizer Covid-19 Vaccine Bivalent Booster 49yrs & up 11/23/2022   Pneumococcal Polysaccharide-23 05/02/2000, 09/24/2018   Pneumococcal-Unspecified 01/12/2024   Td 09/08/2013   Tetanus 01/25/2024   Zoster Recombinant(Shingrix) 10/04/2017, 01/23/2018   Pertinent  Health Maintenance Due  Topic Date Due   FOOT EXAM  Never done   OPHTHALMOLOGY EXAM  Never done   INFLUENZA VACCINE  02/29/2024   HEMOGLOBIN A1C  08/13/2024      07/28/2022    9:00 AM 10/14/2022    7:21 AM 11/28/2022    2:50 PM 06/04/2023    4:59 PM 01/11/2024   11:18 AM  Fall Risk  Falls in the past year?  1 1 0 1  Was there an injury with Fall?  1 0 0 1  Fall Risk Category Calculator  3 1 0 2  (RETIRED) Patient Fall Risk Level High fall risk       Patient at Risk for Falls Due to   History of fall(s);Impaired balance/gait;Impaired mobility No Fall Risks History of fall(s)  Fall risk Follow up  Falls evaluation completed Falls evaluation completed;Education provided;Falls prevention discussed Falls evaluation completed Falls evaluation  completed     Data saved with a previous flowsheet row definition   Functional Status Survey:    Vitals:   02/20/24 1415  BP: 137/70  Pulse: 62  Resp: 13  Temp: (!) 97.5 F (36.4 C)  SpO2: 94%  Weight: 188 lb 11.2 oz (85.6 kg)  Height: 5' 4 (1.626 m)   Body mass index is 32.39 kg/m. Physical Exam  Labs reviewed: Recent Labs    09/28/23 0236 09/29/23 0752 09/30/23 0231 10/01/23  0234 11/19/23 1128 12/18/23 0000 01/22/24 0000 01/28/24 0000 02/04/24 0000  NA 142   < > 142 140 144   < > 141 140 143  K 3.7   < > 3.4* 3.6 4.7   < > 3.9 4.3 4.2  CL 106   < > 107 106 108   < > 104 105 104  CO2 26   < > 24 24  --    < > 26* 21 23*  GLUCOSE 120*   < > 111* 102* 136*  --   --   --   --   BUN 49*   < > 37* 32* 29*   < > 63* 58* 40*  CREATININE 1.99*   < > 1.37* 1.16 1.20   < > 2.3* 1.8* 1.3  CALCIUM  8.7*   < > 8.8* 8.8*  --    < > 9.1 9.5 9.3  MG 2.1  --   --  2.2  --   --   --   --   --    < > = values in this interval not displayed.   Recent Labs    06/18/23 0000 01/22/24 0000  AST 16 14  ALT 15 16  ALKPHOS 91 96  ALBUMIN 3.9 3.9   Recent Labs    09/25/23 2212 09/27/23 0228 09/29/23 0752 09/30/23 0231 11/19/23 1128 01/22/24 0000  WBC 7.4 9.3 9.4 7.1  --  9.1  NEUTROABS 5.6  --   --   --   --   --   HGB 11.7* 10.3* 11.1* 10.0* 12.2* 12.4*  HCT 36.7* 31.0* 33.3* 30.9* 36.0* 36*  MCV 91.5 88.3 88.6 90.4  --   --   PLT 181 187 185 184  --  211   Lab Results  Component Value Date   TSH 6.02 (A) 06/18/2023   Lab Results  Component Value Date   HGBA1C 7.9 02/11/2024   Lab Results  Component Value Date   CHOL 134 02/11/2024   HDL 38 02/11/2024   LDLCALC 59 02/11/2024   TRIG 185 (A) 02/11/2024   CHOLHDL 2.4 11/23/2016    Significant Diagnostic Results in last 30 days:  No results found.  Assessment/Plan There are no diagnoses linked to this encounter.   Family/ staff Communication: ***  Labs/tests ordered:  ***

## 2024-02-21 DIAGNOSIS — M1712 Unilateral primary osteoarthritis, left knee: Secondary | ICD-10-CM | POA: Diagnosis not present

## 2024-02-21 DIAGNOSIS — R278 Other lack of coordination: Secondary | ICD-10-CM | POA: Diagnosis not present

## 2024-02-21 DIAGNOSIS — M6281 Muscle weakness (generalized): Secondary | ICD-10-CM | POA: Diagnosis not present

## 2024-02-21 DIAGNOSIS — R296 Repeated falls: Secondary | ICD-10-CM | POA: Diagnosis not present

## 2024-02-21 DIAGNOSIS — R2689 Other abnormalities of gait and mobility: Secondary | ICD-10-CM | POA: Diagnosis not present

## 2024-02-22 DIAGNOSIS — R278 Other lack of coordination: Secondary | ICD-10-CM | POA: Diagnosis not present

## 2024-02-22 DIAGNOSIS — R296 Repeated falls: Secondary | ICD-10-CM | POA: Diagnosis not present

## 2024-02-25 DIAGNOSIS — R296 Repeated falls: Secondary | ICD-10-CM | POA: Diagnosis not present

## 2024-02-25 DIAGNOSIS — R278 Other lack of coordination: Secondary | ICD-10-CM | POA: Diagnosis not present

## 2024-02-26 DIAGNOSIS — R296 Repeated falls: Secondary | ICD-10-CM | POA: Diagnosis not present

## 2024-02-26 DIAGNOSIS — R278 Other lack of coordination: Secondary | ICD-10-CM | POA: Diagnosis not present

## 2024-02-27 DIAGNOSIS — R278 Other lack of coordination: Secondary | ICD-10-CM | POA: Diagnosis not present

## 2024-02-27 DIAGNOSIS — R296 Repeated falls: Secondary | ICD-10-CM | POA: Diagnosis not present

## 2024-02-28 ENCOUNTER — Encounter: Payer: Self-pay | Admitting: Adult Health

## 2024-02-28 ENCOUNTER — Non-Acute Institutional Stay (SKILLED_NURSING_FACILITY): Admitting: Adult Health

## 2024-02-28 DIAGNOSIS — H1033 Unspecified acute conjunctivitis, bilateral: Secondary | ICD-10-CM | POA: Diagnosis not present

## 2024-02-28 DIAGNOSIS — R3914 Feeling of incomplete bladder emptying: Secondary | ICD-10-CM | POA: Diagnosis not present

## 2024-02-28 DIAGNOSIS — I10 Essential (primary) hypertension: Secondary | ICD-10-CM | POA: Diagnosis not present

## 2024-02-28 DIAGNOSIS — R339 Retention of urine, unspecified: Secondary | ICD-10-CM | POA: Diagnosis not present

## 2024-02-28 DIAGNOSIS — K5901 Slow transit constipation: Secondary | ICD-10-CM | POA: Diagnosis not present

## 2024-02-28 MED ORDER — GENTAMICIN SULFATE 0.1 % EX OINT: 1.0000 | TOPICAL_OINTMENT | Freq: Three times a day (TID) | CUTANEOUS | Status: AC

## 2024-02-28 MED ORDER — SENNA-DOCUSATE SODIUM 8.6-50 MG PO TABS: 2.0000 | ORAL_TABLET | Freq: Every day | ORAL | Status: DC

## 2024-02-28 MED ORDER — POLYETHYLENE GLYCOL 3350 17 G PO PACK: 17.0000 g | PACK | Freq: Every day | ORAL | Status: DC

## 2024-02-28 NOTE — Progress Notes (Signed)
 Location:  Medical illustrator of Service:  SNF (31) Provider:   Bari [place], ANP [facility] [phone]   Charlanne Fredia CROME, MD  Patient Care Team: Charlanne Fredia CROME, MD as PCP - General (Internal Medicine) Court Dorn PARAS, MD as PCP - Cardiology (Cardiology)  Extended Emergency Contact Information Primary Emergency Contact: Christyne Madeline LABOR Address: [address]          [place], [place] [address] [place] Home Phone: [phone] Mobile Phone: [phone] Relation: Spouse  Code Status:  DNR Goals of care: Advanced Directive information    02/20/2024    2:18 PM  Advanced Directives  Does Patient Have a Medical Advance Directive? Yes  Type of Estate agent of [place];Living will;Out of facility DNR (pink MOST or yellow form)  Does patient want to make changes to medical advance directive? No - Patient declined  Copy of Healthcare Power of Attorney in Chart? Yes - validated most recent copy scanned in chart (See row information)  Pre-existing out of facility DNR order (yellow form or pink MOST form) Yellow form placed in chart (order not valid for inpatient use)     Chief Complaint  Patient presents with   Acute Visit    Eye drainage     HPI:  Pt is a 89 y.o. male seen today for an acute visit for eye drainage  The nurse reports that pt has eye drainage which is purulent and redness and swelling.  He has not noticed any changes. No fever, cough, eye pain, or change in vision  Recently diagnosed diabetic. CBG fasting range 172-261 Trying diet changes A1C 7.9 02/11/24  HTN 151/73 Edema in both legs unchanged No sob BUN 40 Cr 1.3   Also he is having urinary retention UA C and S negative Hx of BPH Saw urology 02/28/24 and they recommended keeping a foley  Constipation found on KUB 02/20/24 Now on miralax  and senna s  Having bowel movements every other day      Past Medical  History:  Diagnosis Date   AAA (abdominal aortic aneurysm) (HCC)    asymptomatic   AAA (abdominal aortic aneurysm) (HCC) 2010   peripheral  angiogram-- bilateral SFA DISEASE  and 60 to 70% infrarenaal abd. aortic stenosis with 15 -mm gradient   Adenomatous colon polyp 11/2003   CAD (coronary artery disease)    Gait abnormality 02/13/2017   Gastroparesis    pt denies   GERD (gastroesophageal reflux disease)    w/ LPR   History of kidney stones    x2   Hyperlipidemia    Hypertension    IDA (iron  deficiency anemia)    Peripheral arterial disease (HCC)    RCEA  by Dr Medford Brunswick   PUD (peptic ulcer disease)    pt unaware   Vertigo    Past Surgical History:  Procedure Laterality Date   ABDOMINAL AORTOGRAM N/A 11/19/2023   Procedure: ABDOMINAL AORTOGRAM;  Surgeon: Sheree Penne Bruckner, MD;  Location: [facility] INVASIVE CV LAB;  Service: Cardiovascular;  Laterality: N/A;   ABDOMINAL AORTOGRAM W/LOWER EXTREMITY Bilateral 08/05/2018   Procedure: ABDOMINAL AORTOGRAM W/LOWER EXTREMITY;  Surgeon: Court Dorn PARAS, MD;  Location: [facility] INVASIVE CV LAB;  Service: Cardiovascular;  Laterality: Bilateral;   ABDOMINAL AORTOGRAM W/LOWER EXTREMITY N/A 09/26/2021   Procedure: ABDOMINAL AORTOGRAM W/LOWER EXTREMITY;  Surgeon: Court Dorn PARAS, MD;  Location: [facility] INVASIVE CV LAB;  Service: Cardiovascular;  Laterality: N/A;   AORTOGRAM  04/24/2016  Abdominal aortogram, bilateral iliac angiogram, bifemoral runoff   CARDIAC CATHETERIZATION  05/12/2005   RCA   carotid doppler  01/24/2013   RICA endarterectomy,left CCA 0-49%; left bulb and prox ICA 50-69%; bilaateral subclavian < 50%   CAROTID ENDARTERECTOMY  11/01/2010   CATARACT EXTRACTION     COLONOSCOPY     CORONARY ANGIOPLASTY  05/18/2005   2 STENTS distal RCA AND PROXIMAL-MID RCA   5 total stents per pt.   CYSTOSCOPY/URETEROSCOPY/HOLMIUM LASER/STENT PLACEMENT Left 01/11/2018   Procedure: LEFT URETEROSCOPY/HOLMIUM LASER/STENT PLACEMENT;  Surgeon:  Cam Morene ORN, MD;  Location: [facility] ORS;  Service: Urology;  Laterality: Left;   DOPPLER ECHOCARDIOGRAPHY  02/07/2012   EF 55%,SHOWED NO ISCHEMIA    HERNIA REPAIR     umbilical   lower arterial  doppler  02/04/2013   aotra 1.5 x 1.5 cm; distal abd aorta 70-99%,proximal common iliac arteries -very stenotic with increased velocities>50%,may be falsely elevated as a result of residual plaque from the distal aorta stenosis   LOWER EXTREMITY ANGIOGRAPHY N/A 11/19/2023   Procedure: Lower Extremity Angiography;  Surgeon: Sheree Penne Bruckner, MD;  Location: [facility] INVASIVE CV LAB;  Service: Cardiovascular;  Laterality: N/A;   lower extremity doppler  June 18 ,2013   ABI'S ABNORMAL, RABI was 0.88 and LABI 0.75 ,with 3-vessel  run off   NM MYOVIEW  LTD  MAY 23,2011   showed no significant ischemia;   NM MYOVIEW  LTD  04/22/2008   ef 77%,exercise capcity 6 METS ,exaggerated blood pressure response to exercise   PERIPHERAL VASCULAR CATHETERIZATION N/A 04/24/2016   Procedure: Lower Extremity Angiography;  Surgeon: Dorn JINNY Lesches, MD;  Location: [facility] INVASIVE CV LAB;  Service: Cardiovascular;  Laterality: N/A;   PERIPHERAL VASCULAR CATHETERIZATION  04/24/2016   Procedure: Peripheral Vascular Intervention;  Surgeon: Dorn JINNY Lesches, MD;  Location: [facility] INVASIVE CV LAB;  Service: Cardiovascular;;  Aorta   retrograde central aortic catheterization  05/19/2005   cutting balloon atherectomy, c-circ stenosis with DES STENTING CYPHER   TONSILLECTOMY     TOTAL HIP ARTHROPLASTY Right 07/11/2022   Procedure: TOTAL HIP ARTHROPLASTY ANTERIOR APPROACH;  Surgeon: Ernie Cough, MD;  Location: [facility] ORS;  Service: Orthopedics;  Laterality: Right;   TOTAL KNEE ARTHROPLASTY Left 01/03/2019   Procedure: TOTAL KNEE ARTHROPLASTY;  Surgeon: Kay Kemps, MD;  Location: [facility] ORS;  Service: Orthopedics;  Laterality: Left;  with IS block   TRANSURETHRAL RESECTION OF PROSTATE N/A 09/08/2016   Procedure: TRANSURETHRAL RESECTION OF THE  PROSTATE (TURP);  Surgeon: Morene ORN Cam, MD;  Location: [facility] ORS;  Service: Urology;  Laterality: N/A;   TRANSURETHRAL RESECTION OF PROSTATE     10-10-17  Dr. Cam   TRANSURETHRAL RESECTION OF PROSTATE N/A 10/10/2017   Procedure: TRANSURETHRAL RESECTION OF THE PROSTATE (TURP);  Surgeon: Cam Morene ORN, MD;  Location: [facility] ORS;  Service: Urology;  Laterality: N/A;   VASECTOMY      Allergies  Allergen Reactions   Lisinopril Cough   Niaspan [Niacin Er (Antihyperlipidemic)] Rash    Outpatient Encounter Medications as of 02/28/2024  Medication Sig   gentamicin  ointment (GARAMYCIN ) 0.1 % Apply 1 Application topically 3 (three) times daily for 7 days.   acetaminophen  (TYLENOL ) 500 MG tablet Take 1,000 mg by mouth 2 (two) times daily.   amLODipine  (NORVASC ) 10 MG tablet Take 10 mg by mouth in the morning. Hold if BP <95   apixaban  (ELIQUIS ) 5 MG TABS tablet Take 1 tablet (5 mg total) by mouth 2 (two) times daily.   Apoaequorin (PREVAGEN  PO) Take 1 tablet by mouth in the morning.   atorvastatin  (LIPITOR) 40 MG tablet Take 40 mg by mouth daily at 6 PM.    Biotin  10 MG TABS Take 10 mg by mouth in the morning. Once a morning 8am-11am   cetirizine  (ZYRTEC ) 10 MG tablet Take 1 tablet (10 mg total) by mouth at bedtime.   cholecalciferol  (VITAMIN D3) 25 MCG (1000 UT) tablet Take 1,000 Units by mouth in the morning.   cloNIDine  (CATAPRES ) 0.1 MG tablet Take 1 tablet (0.1 mg total) by mouth 3 (three) times daily. Hold if BP < 95   finasteride  (PROSCAR ) 5 MG tablet Take 5 mg by mouth in the morning.   furosemide  (LASIX ) 20 MG tablet Take 20 mg by mouth daily as needed (HOLD UNTIL 01/28/2024).   furosemide  (LASIX ) 40 MG tablet Take 1 tablet (40 mg total) by mouth daily. HOLD UNTIL 01/28/2024   gabapentin  (NEURONTIN ) 100 MG capsule Take 200 mg by mouth 3 (three) times daily.   Glucosamine-Chondroitin 750-600 MG TABS Take 1 tablet by mouth 2 (two) times daily.   hydrALAZINE  (APRESOLINE ) 50 MG tablet  Take 50 mg by mouth 4 (four) times daily. Hold if BP <95   iron  polysaccharides (NIFEREX) 150 MG capsule Take 150 mg by mouth every Monday, Wednesday, and Friday.   losartan  (COZAAR ) 50 MG tablet Take 50 mg by mouth daily.   melatonin 5 MG TABS Take 1 tablet (5 mg total) by mouth at bedtime.   metoprolol  tartrate (LOPRESSOR ) 50 MG tablet Take 75 mg by mouth 2 (two) times daily.   Multiple Vitamin (MULTIVITAMIN WITH MINERALS) TABS tablet Take 1 tablet by mouth in the morning.   pantoprazole  (PROTONIX ) 40 MG tablet Take 40 mg by mouth 2 (two) times daily.   potassium chloride  SA (K-DUR,KLOR-CON ) 20 MEQ tablet Take 20 mEq by mouth every evening.    tamsulosin  (FLOMAX ) 0.4 MG CAPS capsule Take 1 capsule (0.4 mg total) by mouth daily after supper.   No facility-administered encounter medications on file as of 02/28/2024.    Review of Systems  Constitutional:  Negative for activity change, appetite change, chills, diaphoresis, fatigue and fever.  HENT:  Negative for congestion.   Eyes:  Positive for discharge and redness. Negative for photophobia, pain, itching and visual disturbance.  Respiratory:  Positive for cough. Negative for shortness of breath and wheezing.   Cardiovascular:  Positive for leg swelling. Negative for chest pain.  Gastrointestinal:  Negative for abdominal distention, abdominal pain, constipation, diarrhea, nausea and vomiting.  Genitourinary:  Positive for difficulty urinating. Negative for dysuria, flank pain, frequency, hematuria and urgency.  Musculoskeletal:  Positive for gait problem. Negative for back pain, myalgias and neck pain.  Skin:  Negative for rash.  Neurological:  Negative for dizziness and weakness.  Psychiatric/Behavioral:  Negative for agitation, behavioral problems and confusion.        Memory loss    Immunization History  Administered Date(s) Administered    sv, Bivalent, Protein Subunit Rsvpref,pf (Abrysvo) 07/19/2022   Fluad  Quad(high Dose 65+)  05/02/2022, 05/22/2023   Influenza, High Dose Seasonal PF 03/31/2017, 03/29/2018   Influenza,inj,Quad PF,6+ Mos 04/25/2016, 05/06/2018   Influenza,inj,quad, With Preservative 05/26/2019   Influenza-Unspecified 05/14/2001, 05/31/2001, 05/14/2002, 05/15/2002, 05/18/2003, 05/25/2004, 04/30/2005, 05/29/2006, 05/01/2007, 05/11/2008, 05/31/2009, 05/31/2010, 04/30/2013, 05/31/2014, 03/31/2021   Moderna Covid-19 Vaccine Bivalent Booster 24yrs & up 08/31/2021, 05/22/2023   Moderna SARS-COV2 Booster Vaccination 06/15/2020, 11/19/2020, 01/02/2022   Moderna Sars-Covid-2 Vaccination 08/12/2019, 09/10/2019, 05/31/2022   Pfizer Covid-19 Vaccine Bivalent  Booster 67yrs & up 11/23/2022   Pneumococcal Polysaccharide-23 05/02/2000, 09/24/2018   Pneumococcal-Unspecified 01/12/2024   Td 09/08/2013   Tetanus 01/25/2024   Zoster Recombinant(Shingrix) 10/04/2017, 01/23/2018   Pertinent  Health Maintenance Due  Topic Date Due   FOOT EXAM  Never done   OPHTHALMOLOGY EXAM  Never done   INFLUENZA VACCINE  02/29/2024   HEMOGLOBIN A1C  08/13/2024      07/28/2022    9:00 AM 10/14/2022    7:21 AM 11/28/2022    2:50 PM 06/04/2023    4:59 PM 01/11/2024   11:18 AM  Fall Risk  Falls in the past year?  1 1 0 1  Was there an injury with Fall?  1 0 0 1  Fall Risk Category Calculator  3 1 0 2  (RETIRED) Patient Fall Risk Level High fall risk       Patient at Risk for Falls Due to   History of fall(s);Impaired balance/gait;Impaired mobility No Fall Risks History of fall(s)  Fall risk Follow up  Falls evaluation completed Falls evaluation completed;Education provided;Falls prevention discussed Falls evaluation completed Falls evaluation completed     Data saved with a previous flowsheet row definition   Functional Status Survey:    Vitals:   02/28/24 1825  BP: (!) 151/73  Pulse: 60  Resp: 16  Temp: (!) 97.2 F (36.2 C)  SpO2: 95%  Weight: 192 lb 9.6 oz (87.4 kg)   Body mass index is 33.06 kg/m. Physical  Exam Vitals and nursing note reviewed.  Constitutional:      Appearance: Normal appearance.  HENT:     Head: Normocephalic and atraumatic.     Nose: Nose normal.     Mouth/Throat:     Mouth: Mucous membranes are moist.     Pharynx: Oropharynx is clear.  Eyes:     General:        Right eye: Discharge present.        Left eye: Discharge present.    Pupils: Pupils are equal, round, and reactive to light.     Comments: Erythema to both lids and conjunctiva Purulent drainage noted  Cardiovascular:     Rate and Rhythm: Normal rate and regular rhythm.     Heart sounds: No murmur heard. Pulmonary:     Effort: Pulmonary effort is normal. No respiratory distress.     Breath sounds: Normal breath sounds. No wheezing.  Abdominal:     General: Bowel sounds are normal. There is no distension.     Palpations: Abdomen is soft.     Tenderness: There is no abdominal tenderness.  Musculoskeletal:     Cervical back: No rigidity.     Right lower leg: Edema present.     Left lower leg: Edema present.     Right foot: Normal range of motion.     Left foot: Normal range of motion.  Feet:     Right foot:     Skin integrity: Erythema present. No ulcer, skin breakdown or callus.     Left foot:     Skin integrity: Erythema present. No ulcer or callus.     Comments: Difficult to palpate pulses hx of PAD Lymphadenopathy:     Cervical: No cervical adenopathy.  Skin:    General: Skin is warm and dry.  Neurological:     General: No focal deficit present.     Mental Status: He is alert. Mental status is at baseline.  Psychiatric:        Mood  and Affect: Mood normal.     Labs reviewed: Recent Labs    09/28/23 0236 09/29/23 0752 09/30/23 0231 10/01/23 0234 11/19/23 1128 12/18/23 0000 01/22/24 0000 01/28/24 0000 02/04/24 0000  NA 142   < > 142 140 144   < > 141 140 143  K 3.7   < > 3.4* 3.6 4.7   < > 3.9 4.3 4.2  CL 106   < > 107 106 108   < > 104 105 104  CO2 26   < > 24 24  --    < >  26* 21 23*  GLUCOSE 120*   < > 111* 102* 136*  --   --   --   --   BUN 49*   < > 37* 32* 29*   < > 63* 58* 40*  CREATININE 1.99*   < > 1.37* 1.16 1.20   < > 2.3* 1.8* 1.3  CALCIUM  8.7*   < > 8.8* 8.8*  --    < > 9.1 9.5 9.3  MG 2.1  --   --  2.2  --   --   --   --   --    < > = values in this interval not displayed.   Recent Labs    06/18/23 0000 01/22/24 0000  AST 16 14  ALT 15 16  ALKPHOS 91 96  ALBUMIN 3.9 3.9   Recent Labs    09/25/23 2212 09/27/23 0228 09/29/23 0752 09/30/23 0231 11/19/23 1128 01/22/24 0000  WBC 7.4 9.3 9.4 7.1  --  9.1  NEUTROABS 5.6  --   --   --   --   --   HGB 11.7* 10.3* 11.1* 10.0* 12.2* 12.4*  HCT 36.7* 31.0* 33.3* 30.9* 36.0* 36*  MCV 91.5 88.3 88.6 90.4  --   --   PLT 181 187 185 184  --  211   Lab Results  Component Value Date   TSH 6.02 (A) 06/18/2023   Lab Results  Component Value Date   HGBA1C 7.9 02/11/2024   Lab Results  Component Value Date   CHOL 134 02/11/2024   HDL 38 02/11/2024   LDLCALC 59 02/11/2024   TRIG 185 (A) 02/11/2024   CHOLHDL 2.4 11/23/2016    Significant Diagnostic Results in last 30 days:  No results found.  Assessment/Plan 1. Acute bacterial conjunctivitis of both eyes (Primary) Gentamicin  ointment tid x 7 days to both eyes  2. Urinary retention On flomax  and proscar  Plan to keep foley per urology   3. Slow transit constipation Improved with miralax  and senokot  4. Essential hypertension Recent discontinuation of chlorthalidone  due to renal function and reduction in losartan   Difficult to control bp Hx of left renal artery stenosis  Fall risk  Continue to monitor  May need further adjustment   5. Type 2 DM  CBGs BID x 1 week and report

## 2024-03-03 ENCOUNTER — Encounter: Payer: Self-pay | Admitting: Adult Health

## 2024-03-03 ENCOUNTER — Other Ambulatory Visit: Payer: Self-pay | Admitting: Adult Health

## 2024-03-03 ENCOUNTER — Non-Acute Institutional Stay (SKILLED_NURSING_FACILITY): Admitting: Adult Health

## 2024-03-03 DIAGNOSIS — N1832 Chronic kidney disease, stage 3b: Secondary | ICD-10-CM

## 2024-03-03 DIAGNOSIS — M10379 Gout due to renal impairment, unspecified ankle and foot: Secondary | ICD-10-CM

## 2024-03-03 DIAGNOSIS — E1122 Type 2 diabetes mellitus with diabetic chronic kidney disease: Secondary | ICD-10-CM

## 2024-03-03 DIAGNOSIS — M10171 Lead-induced gout, right ankle and foot: Secondary | ICD-10-CM | POA: Diagnosis not present

## 2024-03-03 DIAGNOSIS — I10 Essential (primary) hypertension: Secondary | ICD-10-CM

## 2024-03-03 DIAGNOSIS — M10172 Lead-induced gout, left ankle and foot: Secondary | ICD-10-CM | POA: Diagnosis not present

## 2024-03-03 MED ORDER — VALSARTAN 80 MG PO TABS: 80.0000 mg | ORAL_TABLET | Freq: Every day | ORAL | 11 refills | Status: DC

## 2024-03-03 NOTE — Progress Notes (Signed)
 Location:  [facility] Room Number: 149-P Place of Service:  SNF [phone]) Provider:  Tawni America, NP     Patient Care Team: Charlanne Fredia CROME, MD as PCP - General (Internal Medicine) Court Dorn PARAS, MD as PCP - Cardiology (Cardiology)  Extended Emergency Contact Information Primary Emergency Contact: Christyne Madeline LABOR Address: [address]          [place], [place] [address] [place] Home Phone: [phone] Mobile Phone: [phone] Relation: Spouse  Code Status:  DNR Goals of care: Advanced Directive information    02/20/2024    2:18 PM  Advanced Directives  Does Patient Have a Medical Advance Directive? Yes  Type of Estate agent of [place];Living will;Out of facility DNR (pink MOST or yellow form)  Does patient want to make changes to medical advance directive? No - Patient declined  Copy of Healthcare Power of Attorney in Chart? Yes - validated most recent copy scanned in chart (See row information)  Pre-existing out of facility DNR order (yellow form or pink MOST form) Yellow form placed in chart (order not valid for inpatient use)     Chief Complaint  Patient presents with   Toe Pain    Acute visit.    HPI:  Pt is a 89 y.o. male seen today for an acute visit for toe pain  The nurse reports he had severe great toe pain bilaterally on 8/2. Oncall provider started colchicine and symptoms improved. Uric acid drawn and is pending.  On diuretics due to CHF and HTN Hx of renal impairment  HTN: SBP range 140-190/60-90 BUN/Cr 40/1.3 Recently taken off chlorthalidone  due to CKD Had dizziness with irbasartan on losartan  at 52 (was reduced due to a period of CKD associated with acute illness) On beta blocker, hydralazine , clonidine , norvasc .  Hx of left renal artery stenosis, AAA, PAD  Followed by cardiology    Blood sugars slightly improving fasting 150-200 fasting with diet changes, was  over 200 02/11/24 A1C 7.9  Long term foley catheter due to BPH with retention Moving bowels well.    Past Medical History:  Diagnosis Date   AAA (abdominal aortic aneurysm) (HCC)    asymptomatic   AAA (abdominal aortic aneurysm) (HCC) 2010   peripheral  angiogram-- bilateral SFA DISEASE  and 60 to 70% infrarenaal abd. aortic stenosis with 15 -mm gradient   Adenomatous colon polyp 11/2003   CAD (coronary artery disease)    Gait abnormality 02/13/2017   Gastroparesis    pt denies   GERD (gastroesophageal reflux disease)    w/ LPR   History of kidney stones    x2   Hyperlipidemia    Hypertension    IDA (iron  deficiency anemia)    Peripheral arterial disease (HCC)    RCEA  by Dr Medford Brunswick   PUD (peptic ulcer disease)    pt unaware   Vertigo    Past Surgical History:  Procedure Laterality Date   ABDOMINAL AORTOGRAM N/A 11/19/2023   Procedure: ABDOMINAL AORTOGRAM;  Surgeon: Sheree Penne Bruckner, MD;  Location: [facility] INVASIVE CV LAB;  Service: Cardiovascular;  Laterality: N/A;   ABDOMINAL AORTOGRAM W/LOWER EXTREMITY Bilateral 08/05/2018   Procedure: ABDOMINAL AORTOGRAM W/LOWER EXTREMITY;  Surgeon: Court Dorn PARAS, MD;  Location: [facility] INVASIVE CV LAB;  Service: Cardiovascular;  Laterality: Bilateral;   ABDOMINAL AORTOGRAM W/LOWER EXTREMITY N/A 09/26/2021   Procedure: ABDOMINAL AORTOGRAM W/LOWER EXTREMITY;  Surgeon: Court Dorn PARAS, MD;  Location: [facility] INVASIVE CV LAB;  Service:  Cardiovascular;  Laterality: N/A;   AORTOGRAM  04/24/2016    Abdominal aortogram, bilateral iliac angiogram, bifemoral runoff   CARDIAC CATHETERIZATION  05/12/2005   RCA   carotid doppler  01/24/2013   RICA endarterectomy,left CCA 0-49%; left bulb and prox ICA 50-69%; bilaateral subclavian < 50%   CAROTID ENDARTERECTOMY  11/01/2010   CATARACT EXTRACTION     COLONOSCOPY     CORONARY ANGIOPLASTY  05/18/2005   2 STENTS distal RCA AND PROXIMAL-MID RCA   5 total stents per pt.    CYSTOSCOPY/URETEROSCOPY/HOLMIUM LASER/STENT PLACEMENT Left 01/11/2018   Procedure: LEFT URETEROSCOPY/HOLMIUM LASER/STENT PLACEMENT;  Surgeon: Cam Morene ORN, MD;  Location: [facility] ORS;  Service: Urology;  Laterality: Left;   DOPPLER ECHOCARDIOGRAPHY  02/07/2012   EF 55%,SHOWED NO ISCHEMIA    HERNIA REPAIR     umbilical   lower arterial  doppler  02/04/2013   aotra 1.5 x 1.5 cm; distal abd aorta 70-99%,proximal common iliac arteries -very stenotic with increased velocities>50%,may be falsely elevated as a result of residual plaque from the distal aorta stenosis   LOWER EXTREMITY ANGIOGRAPHY N/A 11/19/2023   Procedure: Lower Extremity Angiography;  Surgeon: Sheree Penne Bruckner, MD;  Location: [facility] INVASIVE CV LAB;  Service: Cardiovascular;  Laterality: N/A;   lower extremity doppler  June 18 ,2013   ABI'S ABNORMAL, RABI was 0.88 and LABI 0.75 ,with 3-vessel  run off   NM MYOVIEW  LTD  MAY 23,2011   showed no significant ischemia;   NM MYOVIEW  LTD  04/22/2008   ef 77%,exercise capcity 6 METS ,exaggerated blood pressure response to exercise   PERIPHERAL VASCULAR CATHETERIZATION N/A 04/24/2016   Procedure: Lower Extremity Angiography;  Surgeon: Dorn JINNY Lesches, MD;  Location: [facility] INVASIVE CV LAB;  Service: Cardiovascular;  Laterality: N/A;   PERIPHERAL VASCULAR CATHETERIZATION  04/24/2016   Procedure: Peripheral Vascular Intervention;  Surgeon: Dorn JINNY Lesches, MD;  Location: [facility] INVASIVE CV LAB;  Service: Cardiovascular;;  Aorta   retrograde central aortic catheterization  05/19/2005   cutting balloon atherectomy, c-circ stenosis with DES STENTING CYPHER   TONSILLECTOMY     TOTAL HIP ARTHROPLASTY Right 07/11/2022   Procedure: TOTAL HIP ARTHROPLASTY ANTERIOR APPROACH;  Surgeon: Ernie Cough, MD;  Location: [facility] ORS;  Service: Orthopedics;  Laterality: Right;   TOTAL KNEE ARTHROPLASTY Left 01/03/2019   Procedure: TOTAL KNEE ARTHROPLASTY;  Surgeon: Kay Kemps, MD;  Location: [facility] OR[facility];  Service:  Orthopedics;  Laterality: Left;  with IS block   TRANSURETHRAL RESECTION OF PROSTATE N/A 09/08/2016   Procedure: TRANSURETHRAL RESECTION OF THE PROSTATE (TURP);  Surgeon: Morene ORN Cam, MD;  Location: [facility] OR[facility];  Service: Urology;  Laterality: N/A;   TRANSURETHRAL RESECTION OF PROSTATE     10-10-17  Dr. Cam   TRANSURETHRAL RESECTION OF PROSTATE N/A 10/10/2017   Procedure: TRANSURETHRAL RESECTION OF THE PROSTATE (TURP);  Surgeon: Cam Morene ORN, MD;  Location: [facility] ORS;  Service: Urology;  Laterality: N/A;   VASECTOMY      Allergies  Allergen Reactions   Lisinopril Cough   Niaspan [Niacin Er (Antihyperlipidemic)] Rash    Outpatient Encounter Medications as of 03/03/2024  Medication Sig   acetaminophen  (TYLENOL ) 500 MG tablet Take 1,000 mg by mouth 2 (two) times daily.   amLODipine  (NORVASC ) 10 MG tablet Take 10 mg by mouth in the morning. Hold if BP <95   apixaban  (ELIQUIS ) 5 MG TABS tablet Take 1 tablet (5 mg total) by mouth 2 (two) times daily.   Apoaequorin (PREVAGEN PO) Take 1 tablet by mouth  in the morning.   atorvastatin  (LIPITOR) 40 MG tablet Take 40 mg by mouth daily at 6 PM.    Biotin  10 MG TABS Take 10 mg by mouth in the morning. Once a morning 8am-11am   cetirizine  (ZYRTEC ) 10 MG tablet Take 1 tablet (10 mg total) by mouth at bedtime.   cholecalciferol  (VITAMIN D3) 25 MCG (1000 UT) tablet Take 1,000 Units by mouth in the morning.   cloNIDine  (CATAPRES ) 0.1 MG tablet Take 1 tablet (0.1 mg total) by mouth 3 (three) times daily. Hold if BP < 95   colchicine 0.6 MG tablet Take 0.6 mg by mouth 2 (two) times daily.   finasteride  (PROSCAR ) 5 MG tablet Take 5 mg by mouth in the morning.   furosemide  (LASIX ) 20 MG tablet Take 20 mg by mouth daily as needed (HOLD UNTIL 01/28/2024).   furosemide  (LASIX ) 40 MG tablet Take 1 tablet (40 mg total) by mouth daily. HOLD UNTIL 01/28/2024   gabapentin  (NEURONTIN ) 100 MG capsule Take 200 mg by mouth 3 (three) times daily.   gentamicin   ointment (GARAMYCIN ) 0.1 % Apply 1 Application topically 3 (three) times daily for 7 days.   Glucosamine-Chondroitin 750-600 MG TABS Take 1 tablet by mouth 2 (two) times daily.   hydrALAZINE  (APRESOLINE ) 50 MG tablet Take 50 mg by mouth 4 (four) times daily. Hold if BP <95   iron  polysaccharides (NIFEREX) 150 MG capsule Take 150 mg by mouth every Monday, Wednesday, and Friday.   losartan  (COZAAR ) 50 MG tablet Take 50 mg by mouth daily.   melatonin 5 MG TABS Take 1 tablet (5 mg total) by mouth at bedtime.   metoprolol  tartrate (LOPRESSOR ) 50 MG tablet Take 75 mg by mouth 2 (two) times daily.   Multiple Vitamin (MULTIVITAMIN WITH MINERALS) TABS tablet Take 1 tablet by mouth in the morning.   pantoprazole  (PROTONIX ) 40 MG tablet Take 40 mg by mouth 2 (two) times daily.   polyethylene glycol (MIRALAX ) 17 g packet Take 17 g by mouth daily.   potassium chloride  SA (K-DUR,KLOR-CON ) 20 MEQ tablet Take 20 mEq by mouth every evening.    sennosides-docusate sodium  (SENOKOT-S) 8.6-50 MG tablet Take 2 tablets by mouth at bedtime.   tamsulosin  (FLOMAX ) 0.4 MG CAPS capsule Take 1 capsule (0.4 mg total) by mouth daily after supper.   No facility-administered encounter medications on file as of 03/03/2024.    Review of Systems  Constitutional:  Negative for activity change, appetite change, chills, diaphoresis, fatigue and fever.  HENT:  Negative for congestion.   Respiratory:  Negative for cough, shortness of breath and wheezing.   Cardiovascular:  Positive for leg swelling. Negative for chest pain.  Gastrointestinal:  Negative for abdominal distention, abdominal pain, constipation, diarrhea, nausea and vomiting.  Genitourinary:  Negative for difficulty urinating, dysuria and urgency.  Musculoskeletal:  Positive for arthralgias and gait problem. Negative for back pain, myalgias and neck pain.       Toe pain  Skin:  Negative for rash.  Neurological:  Negative for dizziness and weakness.   Psychiatric/Behavioral:  Negative for confusion.     Immunization History  Administered Date(s) Administered    sv, Bivalent, Protein Subunit Rsvpref,pf [place]) 07/19/2022   Fluad  Quad(high Dose 65+) 05/02/2022, 05/22/2023   Influenza, High Dose Seasonal PF 03/31/2017, 03/29/2018   Influenza,inj,Quad PF,6+ Mos 04/25/2016, 05/06/2018   Influenza,inj,quad, With Preservative 05/26/2019   Influenza-Unspecified 05/14/2001, 05/31/2001, 05/14/2002, 05/15/2002, 05/18/2003, 05/25/2004, 04/30/2005, 05/29/2006, 05/01/2007, 05/11/2008, 05/31/2009, 05/31/2010, 04/30/2013, 05/31/2014, 03/31/2021   Moderna Covid-19 Vaccine  Bivalent Booster 79yrs & up 08/31/2021, 05/22/2023   Moderna SARS-COV2 Booster Vaccination 06/15/2020, 11/19/2020, 01/02/2022   Moderna Sars-Covid-2 Vaccination 08/12/2019, 09/10/2019, 05/31/2022   Pfizer Covid-19 Vaccine Bivalent Booster 55yrs & up 11/23/2022   Pneumococcal Polysaccharide-23 05/02/2000, 09/24/2018   Pneumococcal-Unspecified 01/12/2024   Td 09/08/2013   Tetanus 01/25/2024   Zoster Recombinant(Shingrix) 10/04/2017, 01/23/2018   Pertinent  Health Maintenance Due  Topic Date Due   OPHTHALMOLOGY EXAM  Never done   INFLUENZA VACCINE  02/29/2024   HEMOGLOBIN A1C  08/13/2024   FOOT EXAM  02/27/2025      07/28/2022    9:00 AM 10/14/2022    7:21 AM 11/28/2022    2:50 PM 06/04/2023    4:59 PM 01/11/2024   11:18 AM  Fall Risk  Falls in the past year?  1 1 0 1  Was there an injury with Fall?  1 0 0 1  Fall Risk Category Calculator  3 1 0 2  (RETIRED) Patient Fall Risk Level High fall risk       Patient at Risk for Falls Due to   History of fall(s);Impaired balance/gait;Impaired mobility No Fall Risks History of fall(s)  Fall risk Follow up  Falls evaluation completed Falls evaluation completed;Education provided;Falls prevention discussed Falls evaluation completed Falls evaluation completed     Data saved with a previous flowsheet row definition   Functional  Status Survey:    Vitals:   03/03/24 1040 03/03/24 1042  BP: (!) 170/78 (!) 172/65  SpO2: 95%   Weight: 190 lb 6.4 oz (86.4 kg)   Height: 5' 4 (1.626 m)    Body mass index is 32.68 kg/m. Physical Exam Vitals and nursing note reviewed.  Constitutional:      Appearance: Normal appearance.  HENT:     Head: Normocephalic and atraumatic.  Cardiovascular:     Rate and Rhythm: Normal rate and regular rhythm.     Heart sounds: No murmur heard. Pulmonary:     Effort: Pulmonary effort is normal. No respiratory distress.     Breath sounds: Normal breath sounds. No wheezing.  Abdominal:     General: Bowel sounds are normal. There is no distension.     Palpations: Abdomen is soft.     Tenderness: There is no abdominal tenderness.  Musculoskeletal:     Cervical back: Normal range of motion. No rigidity.     Right lower leg: Edema (+1) present.     Left lower leg: Edema (+1) present.  Lymphadenopathy:     Cervical: No cervical adenopathy.  Skin:    General: Skin is warm and dry.     Comments: Mild erythema and swelling to both great toes. NO purulent drainage or open areas.  Small scab to the tip of the left great toe. Mild tenderness to both toes on palpation.   Neurological:     General: No focal deficit present.     Mental Status: He is alert and oriented to person, place, and time. Mental status is at baseline.  Psychiatric:        Mood and Affect: Mood normal.     Labs reviewed: Recent Labs    09/28/23 0236 09/29/23 0752 09/30/23 0231 10/01/23 0234 11/19/23 1128 12/18/23 0000 01/22/24 0000 01/28/24 0000 02/04/24 0000  NA 142   < > 142 140 144   < > 141 140 143  K 3.7   < > 3.4* 3.6 4.7   < > 3.9 4.3 4.2  CL 106   < > 107  106 108   < > 104 105 104  CO2 26   < > 24 24  --    < > 26* 21 23*  GLUCOSE 120*   < > 111* 102* 136*  --   --   --   --   BUN 49*   < > 37* 32* 29*   < > 63* 58* 40*  CREATININE 1.99*   < > 1.37* 1.16 1.20   < > 2.3* 1.8* 1.3  CALCIUM  8.7*    < > 8.8* 8.8*  --    < > 9.1 9.5 9.3  MG 2.1  --   --  2.2  --   --   --   --   --    < > = values in this interval not displayed.   Recent Labs    06/18/23 0000 01/22/24 0000  AST 16 14  ALT 15 16  ALKPHOS 91 96  ALBUMIN 3.9 3.9   Recent Labs    09/25/23 2212 09/27/23 0228 09/29/23 0752 09/30/23 0231 11/19/23 1128 01/22/24 0000  WBC 7.4 9.3 9.4 7.1  --  9.1  NEUTROABS 5.6  --   --   --   --   --   HGB 11.7* 10.3* 11.1* 10.0* 12.2* 12.4*  HCT 36.7* 31.0* 33.3* 30.9* 36.0* 36*  MCV 91.5 88.3 88.6 90.4  --   --   PLT 181 187 185 184  --  211   Lab Results  Component Value Date   TSH 6.02 (A) 06/18/2023   Lab Results  Component Value Date   HGBA1C 7.9 02/11/2024   Lab Results  Component Value Date   CHOL 134 02/11/2024   HDL 38 02/11/2024   LDLCALC 59 02/11/2024   TRIG 185 (A) 02/11/2024   CHOLHDL 2.4 11/23/2016    Significant Diagnostic Results in last 30 days:  No results found.  Assessment/Plan  1. Acute gout due to renal impairment involving toe, unspecified laterality Continue colchicine x 1 more day Improving Uric acid pending If reoccurs consider long term management   2. Essential (primary) hypertension (Primary) Change to valsartan  80 mg  Monitor bp manual every day x 1 week   3. Type 2 diabetes mellitus with stage 3b chronic kidney disease, without long-term current use of insulin (HCC) Improving blood sugars with diet management Continue to reduce sugar and carbs.  Monitor A1C q3-4 months   4. CKD 3b Improved off chlorthalidone .   Labs/tests ordered:  uric acid

## 2024-03-03 NOTE — Progress Notes (Signed)
 SABRA

## 2024-03-17 ENCOUNTER — Encounter: Payer: Self-pay | Admitting: Adult Health

## 2024-03-17 ENCOUNTER — Non-Acute Institutional Stay (SKILLED_NURSING_FACILITY): Admitting: Adult Health

## 2024-03-17 DIAGNOSIS — E782 Mixed hyperlipidemia: Secondary | ICD-10-CM | POA: Diagnosis not present

## 2024-03-17 DIAGNOSIS — N1832 Chronic kidney disease, stage 3b: Secondary | ICD-10-CM

## 2024-03-17 DIAGNOSIS — R413 Other amnesia: Secondary | ICD-10-CM | POA: Diagnosis not present

## 2024-03-17 DIAGNOSIS — I4891 Unspecified atrial fibrillation: Secondary | ICD-10-CM

## 2024-03-17 DIAGNOSIS — I701 Atherosclerosis of renal artery: Secondary | ICD-10-CM | POA: Diagnosis not present

## 2024-03-17 DIAGNOSIS — I5032 Chronic diastolic (congestive) heart failure: Secondary | ICD-10-CM | POA: Diagnosis not present

## 2024-03-17 DIAGNOSIS — I872 Venous insufficiency (chronic) (peripheral): Secondary | ICD-10-CM | POA: Diagnosis not present

## 2024-03-17 DIAGNOSIS — E1122 Type 2 diabetes mellitus with diabetic chronic kidney disease: Secondary | ICD-10-CM

## 2024-03-17 DIAGNOSIS — D509 Iron deficiency anemia, unspecified: Secondary | ICD-10-CM

## 2024-03-17 DIAGNOSIS — K279 Peptic ulcer, site unspecified, unspecified as acute or chronic, without hemorrhage or perforation: Secondary | ICD-10-CM

## 2024-03-17 DIAGNOSIS — I10 Essential (primary) hypertension: Secondary | ICD-10-CM

## 2024-03-17 DIAGNOSIS — M10371 Gout due to renal impairment, right ankle and foot: Secondary | ICD-10-CM | POA: Diagnosis not present

## 2024-03-17 NOTE — Progress Notes (Unsigned)
 Location:  [facility] Room Number: 149 P Place of Service:  SNF [phone]) Provider:  Tawni America, NP    Patient Care Team: Charlanne Fredia CROME, MD as PCP - General (Internal Medicine) Court Dorn PARAS, MD as PCP - Cardiology (Cardiology)  Extended Emergency Contact Information Primary Emergency Contact: Christyne Madeline LABOR Address: [address]          [place], [place] [address] [place] Home Phone: [phone] Mobile Phone: [phone] Relation: Spouse  Code Status:  DNR Goals of care: Advanced Directive information    02/20/2024    2:18 PM  Advanced Directives  Does Patient Have a Medical Advance Directive? Yes  Type of Estate agent of [place];Living will;Out of facility DNR (pink MOST or yellow form)  Does patient want to make changes to medical advance directive? No - Patient declined  Copy of Healthcare Power of Attorney in Chart? Yes - validated most recent copy scanned in chart (See row information)  Pre-existing out of facility DNR order (yellow form or pink MOST form) Yellow form placed in chart (order not valid for inpatient use)     Chief Complaint  Patient presents with   Routine  Visit    HPI:  Pt is a 89 y.o. male seen today for medical management of chronic diseases.    Resides in SNF at [facility]  PMH of CAD, PAD followed with Dr Court, IDA, GERD, HTN, PVD and HLD He had a mechanical fall and subsequent severe right hip pain and inability to bear weight, and impacted subcapital femoral neck fracture. He underwent right THA by Dr. Ernie on 07/11/2022.  Has lumbar stenosis and hx of leg weakness with peripheral neuropathy Hx of AAA ICA stenosis and PAD, followed by Dr Court. Last carotid US  showing bilateral ICA stenosis with progression. Repeat ordered this month 08/2023  Renal artery stenosis left 90%. Also has femoral artery stenosis.   Type 2 DM A1C 7.06 February 2024 Has been  working on diet CBGs improving 150-200  PAD: remote hx of distal aortic stent Follows with vascular surgery Had a heel ulcer, non healing and surgical intervention was considered 11/19/23 surgery opted not to as the ulcer had healed.   He has a hx of urinary retention now with chronic foley  Issues with constipation now resolve per staff  Memory loss: MMSE 26/30 11/2022 Pivot transfer [facility]  Needs assistance wth ADLs  Chronic cough on hycodan, hx of cough due to gerd. No lung disease noted per note by pulmonary 2019. Also hx of peptic ulcer.  ON PPI.  HTN has been difficult to control  Changed from losartan  to valsartan  dose increase to 120 bmp pending 152/76 Some of the readings are high in the record at [facility] but the bp cuff did not appear properly calibrated.    Scaly rash to both legs  Chronic leg swelling  CHF no sob or doe EF 60-65% 09/14/23 Weight stable Wt Readings from Last 3 Encounters:  03/17/24 190 lb 3.2 oz (86.3 kg)  03/03/24 190 lb 6.4 oz (86.4 kg)  02/28/24 192 lb 9.6 oz (87.4 kg)    Past Medical History:  Diagnosis Date   AAA (abdominal aortic aneurysm) (HCC)    asymptomatic   AAA (abdominal aortic aneurysm) (HCC) 2010   peripheral  angiogram-- bilateral SFA DISEASE  and 60 to 70% infrarenaal abd. aortic stenosis with 15 -mm gradient   Adenomatous colon polyp 11/2003   CAD (coronary artery disease)  Gait abnormality 02/13/2017   Gastroparesis    pt denies   GERD (gastroesophageal reflux disease)    w/ LPR   History of kidney stones    x2   Hyperlipidemia    Hypertension    IDA (iron  deficiency anemia)    Peripheral arterial disease (HCC)    RCEA  by Dr Medford Brunswick   PUD (peptic ulcer disease)    pt unaware   Vertigo    Past Surgical History:  Procedure Laterality Date   ABDOMINAL AORTOGRAM N/A 11/19/2023   Procedure: ABDOMINAL AORTOGRAM;  Surgeon: Sheree Penne Bruckner, MD;  Location: [facility] INVASIVE CV LAB;  Service: Cardiovascular;   Laterality: N/A;   ABDOMINAL AORTOGRAM W/LOWER EXTREMITY Bilateral 08/05/2018   Procedure: ABDOMINAL AORTOGRAM W/LOWER EXTREMITY;  Surgeon: Court Dorn PARAS, MD;  Location: [facility] INVASIVE CV LAB;  Service: Cardiovascular;  Laterality: Bilateral;   ABDOMINAL AORTOGRAM W/LOWER EXTREMITY N/A 09/26/2021   Procedure: ABDOMINAL AORTOGRAM W/LOWER EXTREMITY;  Surgeon: Court Dorn PARAS, MD;  Location: [facility] INVASIVE CV LAB;  Service: Cardiovascular;  Laterality: N/A;   AORTOGRAM  04/24/2016    Abdominal aortogram, bilateral iliac angiogram, bifemoral runoff   CARDIAC CATHETERIZATION  05/12/2005   RCA   carotid doppler  01/24/2013   RICA endarterectomy,left CCA 0-49%; left bulb and prox ICA 50-69%; bilaateral subclavian < 50%   CAROTID ENDARTERECTOMY  11/01/2010   CATARACT EXTRACTION     COLONOSCOPY     CORONARY ANGIOPLASTY  05/18/2005   2 STENTS distal RCA AND PROXIMAL-MID RCA   5 total stents per pt.   CYSTOSCOPY/URETEROSCOPY/HOLMIUM LASER/STENT PLACEMENT Left 01/11/2018   Procedure: LEFT URETEROSCOPY/HOLMIUM LASER/STENT PLACEMENT;  Surgeon: Cam Morene ORN, MD;  Location: [facility] ORS;  Service: Urology;  Laterality: Left;   DOPPLER ECHOCARDIOGRAPHY  02/07/2012   EF 55%,SHOWED NO ISCHEMIA    HERNIA REPAIR     umbilical   lower arterial  doppler  02/04/2013   aotra 1.5 x 1.5 cm; distal abd aorta 70-99%,proximal common iliac arteries -very stenotic with increased velocities>50%,may be falsely elevated as a result of residual plaque from the distal aorta stenosis   LOWER EXTREMITY ANGIOGRAPHY N/A 11/19/2023   Procedure: Lower Extremity Angiography;  Surgeon: Sheree Penne Bruckner, MD;  Location[facility] INVASIVE CV LAB;  Service: Cardiovascular;  Laterality: N/A;   lower extremity doppler  June 18 ,2013   ABI'S ABNORMAL, RABI was 0.88 and LABI 0.75 ,with 3-vessel  run off   NM MYOVIEW  LTD  MAY 23,2011   showed no significant ischemia;   NM MYOVIEW  LTD  04/22/2008   ef 77%,exercise capcity 6 METS ,exaggerated  blood pressure response to exercise   PERIPHERAL VASCULAR CATHETERIZATION N/A 04/24/2016   Procedure: Lower Extremity Angiography;  Surgeon: Dorn PARAS Court, MD;  Location: [facility] INVASIVE CV LAB;  Service: Cardiovascular;  Laterality: N/A;   PERIPHERAL VASCULAR CATHETERIZATION  04/24/2016   Procedure: Peripheral Vascular Intervention;  Surgeon: Dorn PARAS Court, MD;  Location: [facility] INVASIVE CV LAB;  Service: Cardiovascular;;  Aorta   retrograde central aortic catheterization  05/19/2005   cutting balloon atherectomy, c-circ stenosis with DES STENTING CYPHER   TONSILLECTOMY     TOTAL HIP ARTHROPLASTY Right 07/11/2022   Procedure: TOTAL HIP ARTHROPLASTY ANTERIOR APPROACH;  Surgeon: Ernie Cough, MD;  Location: [facility] ORS;  Service: Orthopedics;  Laterality: Right;   TOTAL KNEE ARTHROPLASTY Left 01/03/2019   Procedure: TOTAL KNEE ARTHROPLASTY;  Surgeon: Kay Kemps, MD;  Location: [facility] OR[facility];  Service: Orthopedics;  Laterality: Left;  with IS block   TRANSURETHRAL  RESECTION OF PROSTATE N/A 09/08/2016   Procedure: TRANSURETHRAL RESECTION OF THE PROSTATE (TURP);  Surgeon: Morene LELON Salines, MD;  Location: [facility];  Service: Urology;  Laterality: N/A;   TRANSURETHRAL RESECTION OF PROSTATE     10-10-17  Dr. Salines   TRANSURETHRAL RESECTION OF PROSTATE N/A 10/10/2017   Procedure: TRANSURETHRAL RESECTION OF THE PROSTATE (TURP);  Surgeon: Salines Morene LELON, MD;  Location: [facility] ORS;  Service: Urology;  Laterality: N/A;   VASECTOMY      Allergies  Allergen Reactions   Lisinopril Cough   Niaspan [Niacin Er (Antihyperlipidemic)] Rash    Outpatient Encounter Medications as of 03/17/2024  Medication Sig   acetaminophen  (TYLENOL ) 500 MG tablet Take 1,000 mg by mouth 2 (two) times daily.   amLODipine  (NORVASC ) 10 MG tablet Take 10 mg by mouth in the morning. Hold if BP <95   apixaban  (ELIQUIS ) 5 MG TABS tablet Take 1 tablet (5 mg total) by mouth 2 (two) times daily.   Apoaequorin (PREVAGEN PO) Take 1 tablet by mouth in  the morning.   atorvastatin  (LIPITOR) 40 MG tablet Take 40 mg by mouth daily at 6 PM.    Biotin  10 MG TABS Take 10 mg by mouth in the morning. Once a morning 8am-11am   cetirizine  (ZYRTEC ) 10 MG tablet Take 1 tablet (10 mg total) by mouth at bedtime.   cholecalciferol  (VITAMIN D3) 25 MCG (1000 UT) tablet Take 1,000 Units by mouth in the morning.   cloNIDine  (CATAPRES ) 0.1 MG tablet Take 1 tablet (0.1 mg total) by mouth 3 (three) times daily. Hold if BP < 95   finasteride  (PROSCAR ) 5 MG tablet Take 5 mg by mouth in the morning.   furosemide  (LASIX ) 40 MG tablet Take 1 tablet (40 mg total) by mouth daily. HOLD UNTIL 01/28/2024   gabapentin  (NEURONTIN ) 100 MG capsule Take 200 mg by mouth 3 (three) times daily.   Glucosamine-Chondroitin 750-600 MG TABS Take 1 tablet by mouth 2 (two) times daily.   hydrALAZINE  (APRESOLINE ) 50 MG tablet Take 50 mg by mouth 4 (four) times daily. Hold if BP <95   iron  polysaccharides (NIFEREX) 150 MG capsule Take 150 mg by mouth every Monday, Wednesday, and Friday.   melatonin 5 MG TABS Take 1 tablet (5 mg total) by mouth at bedtime.   metoprolol  tartrate (LOPRESSOR ) 50 MG tablet Take 75 mg by mouth 2 (two) times daily.   Multiple Vitamin (MULTIVITAMIN WITH MINERALS) TABS tablet Take 1 tablet by mouth in the morning.   pantoprazole  (PROTONIX ) 40 MG tablet Take 40 mg by mouth 2 (two) times daily.   polyethylene glycol (MIRALAX ) 17 g packet Take 17 g by mouth daily.   potassium chloride  SA (K-DUR,KLOR-CON ) 20 MEQ tablet Take 20 mEq by mouth every evening.    sennosides-docusate sodium  (SENOKOT-S) 8.6-50 MG tablet Take 2 tablets by mouth at bedtime.   tamsulosin  (FLOMAX ) 0.4 MG CAPS capsule Take 1 capsule (0.4 mg total) by mouth daily after supper.   valsartan  (DIOVAN ) 80 MG tablet Take 1 tablet (80 mg total) by mouth daily. (Patient taking differently: Take 120 mg by mouth daily.  Give 120 mg by mouth in the morning for Hypertension Valsartan  120 mg QD)   colchicine 0.6  MG tablet Take 0.6 mg by mouth 2 (two) times daily. (Patient not taking: Reported on 03/17/2024)   furosemide  (LASIX ) 20 MG tablet Take 20 mg by mouth daily as needed (HOLD UNTIL 01/28/2024).   No facility-administered encounter medications on file as of 03/17/2024.  Review of Systems  Constitutional:  Negative for activity change, appetite change, chills, diaphoresis, fatigue, fever and unexpected weight change.  HENT:  Negative for congestion.   Respiratory:  Negative for cough, shortness of breath, wheezing and stridor.   Cardiovascular:  Positive for leg swelling. Negative for chest pain and palpitations.  Gastrointestinal:  Negative for abdominal distention, abdominal pain, constipation and diarrhea.  Genitourinary:  Negative for difficulty urinating and dysuria.  Musculoskeletal:  Negative for arthralgias, back pain, joint swelling and myalgias.  Skin:  Positive for rash.  Neurological:  Negative for dizziness, seizures, syncope, facial asymmetry, speech difficulty, weakness and headaches.  Hematological:  Negative for adenopathy. Does not bruise/bleed easily.  Psychiatric/Behavioral:  Negative for agitation, behavioral problems and confusion.        Memory loss    Immunization History  Administered Date(s) Administered    sv, Bivalent, Protein Subunit Rsvpref,pf (Abrysvo) 07/19/2022   Fluad  Quad(high Dose 65+) 05/02/2022, 05/22/2023   Influenza, High Dose Seasonal PF 03/31/2017, 03/29/2018   Influenza,inj,Quad PF,6+ Mos 04/25/2016, 05/06/2018   Influenza,inj,quad, With Preservative 05/26/2019   Influenza-Unspecified 05/14/2001, 05/31/2001, 05/14/2002, 05/15/2002, 05/18/2003, 05/25/2004, 04/30/2005, 05/29/2006, 05/01/2007, 05/11/2008, 05/31/2009, 05/31/2010, 04/30/2013, 05/31/2014, 03/31/2021   Moderna Covid-19 Vaccine Bivalent Booster 80yrs & up 08/31/2021, 05/22/2023   Moderna SARS-COV2 Booster Vaccination 06/15/2020, 11/19/2020, 01/02/2022   Moderna Sars-Covid-2 Vaccination  08/12/2019, 09/10/2019, 05/31/2022   Pfizer Covid-19 Vaccine Bivalent Booster 62yrs & up 11/23/2022   Pneumococcal Polysaccharide-23 05/02/2000, 09/24/2018   Pneumococcal-Unspecified 01/12/2024   Td 09/08/2013   Tetanus 01/25/2024   Zoster Recombinant(Shingrix) 10/04/2017, 01/23/2018   Pertinent  Health Maintenance Due  Topic Date Due   OPHTHALMOLOGY EXAM  Never done   INFLUENZA VACCINE  02/29/2024   HEMOGLOBIN A1C  08/13/2024   FOOT EXAM  02/27/2025      07/28/2022    9:00 AM 10/14/2022    7:21 AM 11/28/2022    2:50 PM 06/04/2023    4:59 PM 01/11/2024   11:18 AM  Fall Risk  Falls in the past year?  1 1 0 1  Was there an injury with Fall?  1 0 0 1  Fall Risk Category Calculator  3 1 0 2  (RETIRED) Patient Fall Risk Level High fall risk       Patient at Risk for Falls Due to   History of fall(s);Impaired balance/gait;Impaired mobility No Fall Risks History of fall(s)  Fall risk Follow up  Falls evaluation completed Falls evaluation completed;Education provided;Falls prevention discussed Falls evaluation completed Falls evaluation completed     Data saved with a previous flowsheet row definition   Functional Status Survey:    Vitals:   03/17/24 0920  BP: (!) 180/65  Pulse: 71  Resp: 18  Temp: 98.5 F (36.9 C)  SpO2: 92%  Weight: 190 lb 3.2 oz (86.3 kg)  Height: 5' 4 (1.626 m)   Body mass index is 32.65 kg/m. Physical Exam Vitals and nursing note reviewed.  Constitutional:      Appearance: Normal appearance.  HENT:     Head: Normocephalic and atraumatic.  Cardiovascular:     Rate and Rhythm: Normal rate and regular rhythm.     Heart sounds: No murmur heard. Pulmonary:     Effort: Pulmonary effort is normal. No respiratory distress.     Breath sounds: Normal breath sounds. No wheezing.  Abdominal:     General: Bowel sounds are normal. There is no distension.     Palpations: Abdomen is soft.     Tenderness: There is  no abdominal tenderness.  Musculoskeletal:      Cervical back: Normal range of motion. No rigidity.     Right lower leg: Edema present.     Left lower leg: Edema present.  Lymphadenopathy:     Cervical: No cervical adenopathy.  Skin:    General: Skin is warm and dry.     Findings: Rash (dry scaly erythematous rash to BLE) present.  Neurological:     General: No focal deficit present.     Mental Status: He is alert. Mental status is at baseline.  Psychiatric:        Mood and Affect: Mood normal.     Labs reviewed: Recent Labs    09/28/23 0236 09/29/23 0752 09/30/23 0231 10/01/23 0234 11/19/23 1128 12/18/23 0000 01/22/24 0000 01/28/24 0000 02/04/24 0000  NA 142   < > 142 140 144   < > 141 140 143  K 3.7   < > 3.4* 3.6 4.7   < > 3.9 4.3 4.2  CL 106   < > 107 106 108   < > 104 105 104  CO2 26   < > 24 24  --    < > 26* 21 23*  GLUCOSE 120*   < > 111* 102* 136*  --   --   --   --   BUN 49*   < > 37* 32* 29*   < > 63* 58* 40*  CREATININE 1.99*   < > 1.37* 1.16 1.20   < > 2.3* 1.8* 1.3  CALCIUM  8.7*   < > 8.8* 8.8*  --    < > 9.1 9.5 9.3  MG 2.1  --   --  2.2  --   --   --   --   --    < > = values in this interval not displayed.   Recent Labs    06/18/23 0000 01/22/24 0000  AST 16 14  ALT 15 16  ALKPHOS 91 96  ALBUMIN 3.9 3.9   Recent Labs    09/25/23 2212 09/27/23 0228 09/29/23 0752 09/30/23 0231 11/19/23 1128 01/22/24 0000  WBC 7.4 9.3 9.4 7.1  --  9.1  NEUTROABS 5.6  --   --   --   --   --   HGB 11.7* 10.3* 11.1* 10.0* 12.2* 12.4*  HCT 36.7* 31.0* 33.3* 30.9* 36.0* 36*  MCV 91.5 88.3 88.6 90.4  --   --   PLT 181 187 185 184  --  211   Lab Results  Component Value Date   TSH 6.02 (A) 06/18/2023   Lab Results  Component Value Date   HGBA1C 7.9 02/11/2024   Lab Results  Component Value Date   CHOL 134 02/11/2024   HDL 38 02/11/2024   LDLCALC 59 02/11/2024   TRIG 185 (A) 02/11/2024   CHOLHDL 2.4 11/23/2016    Significant Diagnostic Results in last 30 days:  No results  found.  Assessment/Plan   Stasis dermatitis Apply aquaphor and triamcinolone  1:1 ratio x 2 weeks  Type 2 diabetes mellitus with diabetic chronic kidney disease (HCC) A1C 7.9 02/11/24 Working on diet management Fasting CBGs have improved.   Renal artery stenosis (HCC) 90% on the left No intervention performed due to age/debility HTN difficult to control  Peptic ulcer On protonix  bid Also hx of chronic cough  Iron  deficiency anemia Hgb 12.4 Hct 36 Improved over time Continue iron   Hyperlipidemia LDL 59 On lipitor   Atrial fibrillation with rapid  ventricular response (HCC) Rate controlled Regular on exam  On eliquis  for CVA risk reduction   Diastolic CHF (HCC) Compensated Followed by cardiology On daily lasix .   Essential hypertension Difficult to control Improving with valsartan  120 mg Bmp pending BPs skewed in [facility] record When proper manual done he is close to goal   Gout attack Uric acid 11.6 Acute attack resolved Consider prevention med if reoccurs  Memory loss Doing well in SNF   Labs/tests ordered:  BMP pending

## 2024-03-18 ENCOUNTER — Encounter: Payer: Self-pay | Admitting: Adult Health

## 2024-03-18 DIAGNOSIS — I872 Venous insufficiency (chronic) (peripheral): Secondary | ICD-10-CM | POA: Insufficient documentation

## 2024-03-18 DIAGNOSIS — M109 Gout, unspecified: Secondary | ICD-10-CM | POA: Insufficient documentation

## 2024-03-18 DIAGNOSIS — I5030 Unspecified diastolic (congestive) heart failure: Secondary | ICD-10-CM | POA: Insufficient documentation

## 2024-03-18 MED ORDER — TRIAMCINOLONE ACETONIDE 0.1 % EX CREA: 1.0000 | TOPICAL_CREAM | Freq: Every day | CUTANEOUS | Status: DC

## 2024-03-18 MED ORDER — VALSARTAN 80 MG PO TABS: 120.0000 mg | ORAL_TABLET | Freq: Every day | ORAL | Status: DC

## 2024-03-18 MED ORDER — AQUAPHOR EX OINT: TOPICAL_OINTMENT | Freq: Every day | CUTANEOUS | Status: DC

## 2024-03-18 NOTE — Assessment & Plan Note (Signed)
 Rate controlled Regular on exam  On eliquis  for CVA risk reduction

## 2024-03-18 NOTE — Assessment & Plan Note (Signed)
 A1C 7.9 02/11/24 Working on diet management Fasting CBGs have improved.

## 2024-03-18 NOTE — Assessment & Plan Note (Signed)
 90% on the left No intervention performed due to age/debility HTN difficult to control

## 2024-03-18 NOTE — Assessment & Plan Note (Signed)
 Uric acid 11.6 Acute attack resolved Consider prevention med if reoccurs

## 2024-03-18 NOTE — Assessment & Plan Note (Signed)
 Hgb 12.4 Hct 36 Improved over time Continue iron 

## 2024-03-18 NOTE — Assessment & Plan Note (Signed)
 Difficult to control Improving with valsartan  120 mg Bmp pending BPs skewed in [facility] record When proper manual done he is close to goal

## 2024-03-18 NOTE — Assessment & Plan Note (Signed)
 On protonix  bid Also hx of chronic cough

## 2024-03-18 NOTE — Assessment & Plan Note (Signed)
 LDL 59 On lipitor

## 2024-03-18 NOTE — Assessment & Plan Note (Signed)
 Apply aquaphor and triamcinolone  1:1 ratio x 2 weeks

## 2024-03-18 NOTE — Assessment & Plan Note (Signed)
 Doing well in SNF

## 2024-03-18 NOTE — Assessment & Plan Note (Signed)
 Compensated Followed by cardiology On daily lasix .

## 2024-03-21 DIAGNOSIS — R296 Repeated falls: Secondary | ICD-10-CM | POA: Diagnosis not present

## 2024-03-21 DIAGNOSIS — R278 Other lack of coordination: Secondary | ICD-10-CM | POA: Diagnosis not present

## 2024-04-01 DIAGNOSIS — R278 Other lack of coordination: Secondary | ICD-10-CM | POA: Diagnosis not present

## 2024-04-01 DIAGNOSIS — B351 Tinea unguium: Secondary | ICD-10-CM | POA: Diagnosis not present

## 2024-04-01 DIAGNOSIS — R296 Repeated falls: Secondary | ICD-10-CM | POA: Diagnosis not present

## 2024-04-02 DIAGNOSIS — R296 Repeated falls: Secondary | ICD-10-CM | POA: Diagnosis not present

## 2024-04-02 DIAGNOSIS — R278 Other lack of coordination: Secondary | ICD-10-CM | POA: Diagnosis not present

## 2024-04-03 DIAGNOSIS — R296 Repeated falls: Secondary | ICD-10-CM | POA: Diagnosis not present

## 2024-04-03 DIAGNOSIS — R278 Other lack of coordination: Secondary | ICD-10-CM | POA: Diagnosis not present

## 2024-04-08 DIAGNOSIS — R278 Other lack of coordination: Secondary | ICD-10-CM | POA: Diagnosis not present

## 2024-04-08 DIAGNOSIS — R296 Repeated falls: Secondary | ICD-10-CM | POA: Diagnosis not present

## 2024-04-09 DIAGNOSIS — R296 Repeated falls: Secondary | ICD-10-CM | POA: Diagnosis not present

## 2024-04-09 DIAGNOSIS — R278 Other lack of coordination: Secondary | ICD-10-CM | POA: Diagnosis not present

## 2024-04-10 DIAGNOSIS — R296 Repeated falls: Secondary | ICD-10-CM | POA: Diagnosis not present

## 2024-04-10 DIAGNOSIS — R278 Other lack of coordination: Secondary | ICD-10-CM | POA: Diagnosis not present

## 2024-04-17 ENCOUNTER — Encounter: Payer: Self-pay | Admitting: Adult Health

## 2024-04-17 ENCOUNTER — Non-Acute Institutional Stay (SKILLED_NURSING_FACILITY): Admitting: Adult Health

## 2024-04-17 DIAGNOSIS — E1122 Type 2 diabetes mellitus with diabetic chronic kidney disease: Secondary | ICD-10-CM

## 2024-04-17 DIAGNOSIS — R413 Other amnesia: Secondary | ICD-10-CM

## 2024-04-17 DIAGNOSIS — I4891 Unspecified atrial fibrillation: Secondary | ICD-10-CM

## 2024-04-17 DIAGNOSIS — I10 Essential (primary) hypertension: Secondary | ICD-10-CM | POA: Diagnosis not present

## 2024-04-17 DIAGNOSIS — I739 Peripheral vascular disease, unspecified: Secondary | ICD-10-CM | POA: Diagnosis not present

## 2024-04-17 DIAGNOSIS — I5032 Chronic diastolic (congestive) heart failure: Secondary | ICD-10-CM | POA: Diagnosis not present

## 2024-04-17 DIAGNOSIS — G629 Polyneuropathy, unspecified: Secondary | ICD-10-CM | POA: Diagnosis not present

## 2024-04-17 DIAGNOSIS — D509 Iron deficiency anemia, unspecified: Secondary | ICD-10-CM

## 2024-04-17 DIAGNOSIS — E782 Mixed hyperlipidemia: Secondary | ICD-10-CM | POA: Diagnosis not present

## 2024-04-17 DIAGNOSIS — N1832 Chronic kidney disease, stage 3b: Secondary | ICD-10-CM

## 2024-04-17 NOTE — Assessment & Plan Note (Signed)
 Compensated Followed by cardiology On daily lasix .

## 2024-04-17 NOTE — Assessment & Plan Note (Signed)
 Doing well in SNF

## 2024-04-17 NOTE — Assessment & Plan Note (Signed)
 LDL 59 On lipitor

## 2024-04-17 NOTE — Assessment & Plan Note (Signed)
 Hgb 12.4 Hct 36  Continue iron 

## 2024-04-17 NOTE — Assessment & Plan Note (Signed)
 Improvement noted but continues to be an issue Fall risk  Monitor bp Continue current regimen.

## 2024-04-17 NOTE — Assessment & Plan Note (Signed)
 Followed by vascular and cardiology No acute issues On statin and eliquis 

## 2024-04-17 NOTE — Progress Notes (Signed)
 Location:  Medical illustrator of Service:  SNF (31) Provider:  Tawni America, NP    Patient Care Team: Charlanne Fredia CROME, MD as PCP - General (Internal Medicine) Court Dorn PARAS, MD as PCP - Cardiology (Cardiology)  Extended Emergency Contact Information Primary Emergency Contact: Christyne Madeline LABOR Address: [address]          [place], [place] [address] [place] Home Phone: [phone] Mobile Phone: [phone] Relation: Spouse  Code Status:  DNR Goals of care: Advanced Directive information    02/20/2024    2:18 PM  Advanced Directives  Does Patient Have a Medical Advance Directive? Yes  Type of Estate agent of [place];Living will;Out of facility DNR (pink MOST or yellow form)  Does patient want to make changes to medical advance directive? No - Patient declined  Copy of Healthcare Power of Attorney in Chart? Yes - validated most recent copy scanned in chart (See row information)  Pre-existing out of facility DNR order (yellow form or pink MOST form) Yellow form placed in chart (order not valid for inpatient use)     Chief Complaint  Patient presents with   Medical Management of Chronic Issues    HPI:  Pt is a 89 y.o. male seen today for medical management of chronic diseases.    Resides in SNF at [facility]  PMH of CAD, PAD followed with Dr Court, IDA, GERD, HTN, PVD and HLD He had a mechanical fall and subsequent severe right hip pain and inability to bear weight, and impacted subcapital femoral neck fracture. He underwent right THA by Dr. Ernie on 07/11/2022.  Has lumbar stenosis and hx of leg weakness with peripheral neuropathy Hx of AAA ICA stenosis and PAD, followed by Dr Court. Last carotid US  showing bilateral ICA stenosis with progression. Repeat ordered this month 08/2023  Renal artery stenosis left 90%. Also has femoral artery stenosis.   Type 2 DM A1C 7.06 February 2024 Trying diet  modifications  CBGs improving 150-238  Afib: no symptoms reported, on eliquis  for CVA risk reduction   PAD: remote hx of distal aortic stent Follows with vascular surgery Had a heel ulcer, non healing and surgical intervention was considered 11/19/23 surgery opted not to as the ulcer had healed.   HLD on atorvastatin   Lab Results  Component Value Date   LDLCALC 59 02/11/2024   IDA on iron  Lab Results  Component Value Date   HGB 12.4 (A) 01/22/2024   CKD  BUN 40.5 Cr 1.53 K 4.8  He has a hx of urinary retention now with chronic foley  NO issues with constipation reported on miralax  and senna  Memory loss: MMSE 26/30 11/2022 [facility]  Needs assistance wth ADLs  Chronic cough: ?due to gerd. No lung disease noted per note by pulmonary 2019. Also hx of peptic ulcer.  ON PPI. ALso uses zyrtec   HTN has been difficult to control  Changed from losartan  to valsartan  dose increase to 120 bmp pending 146/62  Rash to his legs which was statis dermatitis improved with triamcinolone  Has two small open areas to his left leg  CHF no sob or doe EF 60-65% 09/14/23 Weight stable Wt Readings from Last 3 Encounters:  04/17/24 187 lb 14.4 oz (85.2 kg)  03/17/24 190 lb 3.2 oz (86.3 kg)  03/03/24 190 lb 6.4 oz (86.4 kg)    Past Medical History:  Diagnosis Date   AAA (abdominal aortic aneurysm) (HCC)    asymptomatic  AAA (abdominal aortic aneurysm) (HCC) 2010   peripheral  angiogram-- bilateral SFA DISEASE  and 60 to 70% infrarenaal abd. aortic stenosis with 15 -mm gradient   Adenomatous colon polyp 11/2003   CAD (coronary artery disease)    Gait abnormality 02/13/2017   Gastroparesis    pt denies   GERD (gastroesophageal reflux disease)    w/ LPR   History of kidney stones    x2   Hyperlipidemia    Hypertension    IDA (iron  deficiency anemia)    Peripheral arterial disease (HCC)    RCEA  by Dr Medford Brunswick   PUD (peptic ulcer disease)    pt unaware   Vertigo    Past Surgical  History:  Procedure Laterality Date   ABDOMINAL AORTOGRAM N/A 11/19/2023   Procedure: ABDOMINAL AORTOGRAM;  Surgeon: Sheree Penne Bruckner, MD;  Location: [facility] INVASIVE CV LAB;  Service: Cardiovascular;  Laterality: N/A;   ABDOMINAL AORTOGRAM W/LOWER EXTREMITY Bilateral 08/05/2018   Procedure: ABDOMINAL AORTOGRAM W/LOWER EXTREMITY;  Surgeon: Court Dorn PARAS, MD;  Location: [facility] INVASIVE CV LAB;  Service: Cardiovascular;  Laterality: Bilateral;   ABDOMINAL AORTOGRAM W/LOWER EXTREMITY N/A 09/26/2021   Procedure: ABDOMINAL AORTOGRAM W/LOWER EXTREMITY;  Surgeon: Court Dorn PARAS, MD;  Location: [facility] INVASIVE CV LAB;  Service: Cardiovascular;  Laterality: N/A;   AORTOGRAM  04/24/2016    Abdominal aortogram, bilateral iliac angiogram, bifemoral runoff   CARDIAC CATHETERIZATION  05/12/2005   RCA   carotid doppler  01/24/2013   RICA endarterectomy,left CCA 0-49%; left bulb and prox ICA 50-69%; bilaateral subclavian < 50%   CAROTID ENDARTERECTOMY  11/01/2010   CATARACT EXTRACTION     COLONOSCOPY     CORONARY ANGIOPLASTY  05/18/2005   2 STENTS distal RCA AND PROXIMAL-MID RCA   5 total stents per pt.   CYSTOSCOPY/URETEROSCOPY/HOLMIUM LASER/STENT PLACEMENT Left 01/11/2018   Procedure: LEFT URETEROSCOPY/HOLMIUM LASER/STENT PLACEMENT;  Surgeon: Cam Morene ORN, MD;  Location: [facility] ORS;  Service: Urology;  Laterality: Left;   DOPPLER ECHOCARDIOGRAPHY  02/07/2012   EF 55%,SHOWED NO ISCHEMIA    HERNIA REPAIR     umbilical   lower arterial  doppler  02/04/2013   aotra 1.5 x 1.5 cm; distal abd aorta 70-99%,proximal common iliac arteries -very stenotic with increased velocities>50%,may be falsely elevated as a result of residual plaque from the distal aorta stenosis   LOWER EXTREMITY ANGIOGRAPHY N/A 11/19/2023   Procedure: Lower Extremity Angiography;  Surgeon: Sheree Penne Bruckner, MD;  Location: [facility] INVASIVE CV LAB;  Service: Cardiovascular;  Laterality: N/A;   lower extremity doppler  June 18 ,2013   ABI'S  ABNORMAL, RABI was 0.88 and LABI 0.75 ,with 3-vessel  run off   NM MYOVIEW  LTD  MAY 23,2011   showed no significant ischemia;   NM MYOVIEW  LTD  04/22/2008   ef 77%,exercise capcity 6 METS ,exaggerated blood pressure response to exercise   PERIPHERAL VASCULAR CATHETERIZATION N/A 04/24/2016   Procedure: Lower Extremity Angiography;  Surgeon: Dorn PARAS Court, MD;  Location[facility] INVASIVE CV LAB;  Service: Cardiovascular;  Laterality: N/A;   PERIPHERAL VASCULAR CATHETERIZATION  04/24/2016   Procedure: Peripheral Vascular Intervention;  Surgeon: Dorn PARAS Court, MD;  Location: [facility] INVASIVE CV LAB;  Service: Cardiovascular;;  Aorta   retrograde central aortic catheterization  05/19/2005   cutting balloon atherectomy, c-circ stenosis with DES STENTING CYPHER   TONSILLECTOMY     TOTAL HIP ARTHROPLASTY Right 07/11/2022   Procedure: TOTAL HIP ARTHROPLASTY ANTERIOR APPROACH;  Surgeon: Ernie Cough, MD;  Location: [facility];  Service: Orthopedics;  Laterality: Right;   TOTAL KNEE ARTHROPLASTY Left 01/03/2019   Procedure: TOTAL KNEE ARTHROPLASTY;  Surgeon: Kay Kemps, MD;  Location: [facility];  Service: Orthopedics;  Laterality: Left;  with IS block   TRANSURETHRAL RESECTION OF PROSTATE N/A 09/08/2016   Procedure: TRANSURETHRAL RESECTION OF THE PROSTATE (TURP);  Surgeon: Morene LELON Salines, MD;  Location: [facility];  Service: Urology;  Laterality: N/A;   TRANSURETHRAL RESECTION OF PROSTATE     10-10-17  Dr. Salines   TRANSURETHRAL RESECTION OF PROSTATE N/A 10/10/2017   Procedure: TRANSURETHRAL RESECTION OF THE PROSTATE (TURP);  Surgeon: Salines Morene LELON, MD;  Location: [facility];  Service: Urology;  Laterality: N/A;   VASECTOMY      Allergies  Allergen Reactions   Lisinopril Cough   Niaspan [Niacin Er (Antihyperlipidemic)] Rash    Outpatient Encounter Medications as of 04/17/2024  Medication Sig   acetaminophen  (TYLENOL ) 500 MG tablet Take 1,000 mg by mouth 2 (two) times daily.   amLODipine  (NORVASC ) 10 MG  tablet Take 10 mg by mouth in the morning. Hold if BP <95   apixaban  (ELIQUIS ) 5 MG TABS tablet Take 1 tablet (5 mg total) by mouth 2 (two) times daily.   Apoaequorin (PREVAGEN PO) Take 1 tablet by mouth in the morning.   atorvastatin  (LIPITOR) 40 MG tablet Take 40 mg by mouth daily at 6 PM.    Biotin  10 MG TABS Take 10 mg by mouth in the morning. Once a morning 8am-11am   cetirizine  (ZYRTEC ) 10 MG tablet Take 1 tablet (10 mg total) by mouth at bedtime.   cholecalciferol  (VITAMIN D3) 25 MCG (1000 UT) tablet Take 1,000 Units by mouth in the morning.   cloNIDine  (CATAPRES ) 0.1 MG tablet Take 1 tablet (0.1 mg total) by mouth 3 (three) times daily. Hold if BP < 95   finasteride  (PROSCAR ) 5 MG tablet Take 5 mg by mouth in the morning.   furosemide  (LASIX ) 20 MG tablet Take 20 mg by mouth daily as needed (HOLD UNTIL 01/28/2024).   furosemide  (LASIX ) 40 MG tablet Take 1 tablet (40 mg total) by mouth daily. HOLD UNTIL 01/28/2024   gabapentin  (NEURONTIN ) 100 MG capsule Take 200 mg by mouth 3 (three) times daily.   Glucosamine-Chondroitin 750-600 MG TABS Take 1 tablet by mouth 2 (two) times daily.   hydrALAZINE  (APRESOLINE ) 50 MG tablet Take 50 mg by mouth 4 (four) times daily. Hold if BP <95   iron  polysaccharides (NIFEREX) 150 MG capsule Take 150 mg by mouth every Monday, Wednesday, and Friday.   melatonin 5 MG TABS Take 1 tablet (5 mg total) by mouth at bedtime.   metoprolol  tartrate (LOPRESSOR ) 50 MG tablet Take 75 mg by mouth 2 (two) times daily.   mineral oil-hydrophilic petrolatum (AQUAPHOR) ointment Apply topically at bedtime.   pantoprazole  (PROTONIX ) 40 MG tablet Take 40 mg by mouth 2 (two) times daily.   polyethylene glycol (MIRALAX ) 17 g packet Take 17 g by mouth daily.   potassium chloride  SA (K-DUR,KLOR-CON ) 20 MEQ tablet Take 20 mEq by mouth every evening.    sennosides-docusate sodium  (SENOKOT-S) 8.6-50 MG tablet Take 2 tablets by mouth at bedtime.   tamsulosin  (FLOMAX ) 0.4 MG CAPS  capsule Take 1 capsule (0.4 mg total) by mouth daily after supper.   valsartan  (DIOVAN ) 80 MG tablet Take 1.5 tablets (120 mg total) by mouth daily.  Give 120 mg by mouth in the morning for Hypertension Valsartan  120 mg QD   Multiple Vitamin (MULTIVITAMIN  WITH MINERALS) TABS tablet Take 1 tablet by mouth in the morning.   [DISCONTINUED] triamcinolone  cream (KENALOG ) 0.1 % Apply 1 Application topically at bedtime.   No facility-administered encounter medications on file as of 04/17/2024.    Review of Systems  Constitutional:  Negative for activity change, appetite change, chills, diaphoresis, fatigue, fever and unexpected weight change.  HENT:  Negative for congestion.   Respiratory:  Negative for cough, shortness of breath, wheezing and stridor.   Cardiovascular:  Positive for leg swelling. Negative for chest pain and palpitations.  Gastrointestinal:  Negative for abdominal distention, abdominal pain, constipation and diarrhea.  Genitourinary:  Negative for difficulty urinating and dysuria.  Musculoskeletal:  Negative for arthralgias, back pain, joint swelling and myalgias.  Skin:  Negative for rash.  Neurological:  Negative for dizziness, seizures, syncope, facial asymmetry, speech difficulty, weakness and headaches.  Hematological:  Negative for adenopathy. Does not bruise/bleed easily.  Psychiatric/Behavioral:  Negative for agitation, behavioral problems and confusion.        Memory loss    Immunization History  Administered Date(s) Administered    sv, Bivalent, Protein Subunit Rsvpref,pf (Abrysvo) 07/19/2022   Fluad  Quad(high Dose 65+) 05/02/2022, 05/22/2023   INFLUENZA, HIGH DOSE SEASONAL PF 03/31/2017, 03/29/2018   Influenza,inj,Quad PF,6+ Mos 04/25/2016, 05/06/2018   Influenza,inj,quad, With Preservative 05/26/2019   Influenza-Unspecified 05/14/2001, 05/31/2001, 05/14/2002, 05/15/2002, 05/18/2003, 05/25/2004, 04/30/2005, 05/29/2006, 05/01/2007, 05/11/2008, 05/31/2009, 05/31/2010,  04/30/2013, 05/31/2014, 03/31/2021   Moderna Covid-19 Vaccine Bivalent Booster 16yrs & up 08/31/2021, 05/22/2023   Moderna SARS-COV2 Booster Vaccination 06/15/2020, 11/19/2020, 01/02/2022   Moderna Sars-Covid-2 Vaccination 08/12/2019, 09/10/2019, 05/31/2022   Pfizer Covid-19 Vaccine Bivalent Booster 61yrs & up 11/23/2022   Pneumococcal Polysaccharide-23 05/02/2000, 09/24/2018   Pneumococcal-Unspecified 01/12/2024   Td 09/08/2013   Tetanus 01/25/2024   Zoster Recombinant(Shingrix) 10/04/2017, 01/23/2018   Pertinent  Health Maintenance Due  Topic Date Due   OPHTHALMOLOGY EXAM  Never done   Influenza Vaccine  02/29/2024   HEMOGLOBIN A1C  08/13/2024   FOOT EXAM  02/27/2025      07/28/2022    9:00 AM 10/14/2022    7:21 AM 11/28/2022    2:50 PM 06/04/2023    4:59 PM 01/11/2024   11:18 AM  Fall Risk  Falls in the past year?  1 1 0 1  Was there an injury with Fall?  1 0 0 1  Fall Risk Category Calculator  3 1 0 2  (RETIRED) Patient Fall Risk Level High fall risk       Patient at Risk for Falls Due to   History of fall(s);Impaired balance/gait;Impaired mobility No Fall Risks History of fall(s)  Fall risk Follow up  Falls evaluation completed Falls evaluation completed;Education provided;Falls prevention discussed Falls evaluation completed Falls evaluation completed     Data saved with a previous flowsheet row definition   Functional Status Survey:    Vitals:   04/17/24 1505  BP: (!) 146/62  Pulse: 63  Resp: 16  Temp: 97.6 F (36.4 C)  SpO2: 97%  Weight: 187 lb 14.4 oz (85.2 kg)   Body mass index is 32.25 kg/m. Physical Exam Vitals and nursing note reviewed.  Constitutional:      Appearance: Normal appearance.  HENT:     Head: Normocephalic and atraumatic.  Cardiovascular:     Rate and Rhythm: Normal rate and regular rhythm.     Heart sounds: No murmur heard. Pulmonary:     Effort: Pulmonary effort is normal. No respiratory distress.     Breath sounds: Normal  breath  sounds. No wheezing.  Abdominal:     General: Bowel sounds are normal. There is no distension.     Palpations: Abdomen is soft.     Tenderness: There is no abdominal tenderness.  Musculoskeletal:     Cervical back: No rigidity.     Right lower leg: Edema present.     Left lower leg: Edema present.  Lymphadenopathy:     Cervical: No cervical adenopathy.  Skin:    General: Skin is warm and dry.     Comments: Two small open areas to LLE with 100% pink tissue  Neurological:     General: No focal deficit present.     Mental Status: He is alert. Mental status is at baseline.  Psychiatric:        Mood and Affect: Mood normal.     Labs reviewed: Recent Labs    09/28/23 0236 09/29/23 0752 09/30/23 0231 10/01/23 0234 11/19/23 1128 12/18/23 0000 01/22/24 0000 01/28/24 0000 02/04/24 0000  NA 142   < > 142 140 144   < > 141 140 143  K 3.7   < > 3.4* 3.6 4.7   < > 3.9 4.3 4.2  CL 106   < > 107 106 108   < > 104 105 104  CO2 26   < > 24 24  --    < > 26* 21 23*  GLUCOSE 120*   < > 111* 102* 136*  --   --   --   --   BUN 49*   < > 37* 32* 29*   < > 63* 58* 40*  CREATININE 1.99*   < > 1.37* 1.16 1.20   < > 2.3* 1.8* 1.3  CALCIUM  8.7*   < > 8.8* 8.8*  --    < > 9.1 9.5 9.3  MG 2.1  --   --  2.2  --   --   --   --   --    < > = values in this interval not displayed.   Recent Labs    06/18/23 0000 01/22/24 0000  AST 16 14  ALT 15 16  ALKPHOS 91 96  ALBUMIN 3.9 3.9   Recent Labs    09/25/23 2212 09/27/23 0228 09/29/23 0752 09/30/23 0231 11/19/23 1128 01/22/24 0000  WBC 7.4 9.3 9.4 7.1  --  9.1  NEUTROABS 5.6  --   --   --   --   --   HGB 11.7* 10.3* 11.1* 10.0* 12.2* 12.4*  HCT 36.7* 31.0* 33.3* 30.9* 36.0* 36*  MCV 91.5 88.3 88.6 90.4  --   --   PLT 181 187 185 184  --  211   Lab Results  Component Value Date   TSH 6.02 (A) 06/18/2023   Lab Results  Component Value Date   HGBA1C 7.9 02/11/2024   Lab Results  Component Value Date   CHOL 134 02/11/2024   HDL  38 02/11/2024   LDLCALC 59 02/11/2024   TRIG 185 (A) 02/11/2024   CHOLHDL 2.4 11/23/2016    Significant Diagnostic Results in last 30 days:  No results found.  Assessment/Plan   Type 2 diabetes mellitus with diabetic chronic kidney disease (HCC) A1C 7.9 02/11/24 Working on diet management Monitor CBGs A1C next month  Peripheral arterial disease (HCC) Followed by vascular and cardiology No acute issues On statin and eliquis    Hyperlipidemia LDL 59 On lipitor   Memory loss Doing well in SNF  Iron  deficiency  anemia Hgb 12.4 Hct 36  Continue iron   Essential hypertension Improvement noted but continues to be an issue Fall risk  Monitor bp Continue current regimen.    Diastolic CHF (HCC) Compensated Followed by cardiology On daily lasix .   Atrial fibrillation with rapid ventricular response (HCC) Rate controlled Regular on exam  On eliquis  for CVA risk reduction   Neuropathy Continue neurontin     Labs/tests ordered:  A1C at next visit.

## 2024-04-17 NOTE — Assessment & Plan Note (Signed)
Continue neurontin 

## 2024-04-17 NOTE — Assessment & Plan Note (Signed)
 Rate controlled Regular on exam  On eliquis  for CVA risk reduction

## 2024-04-17 NOTE — Assessment & Plan Note (Signed)
 A1C 7.9 02/11/24 Working on diet management Monitor CBGs A1C next month

## 2024-05-06 ENCOUNTER — Telehealth: Payer: Self-pay | Admitting: Cardiovascular Disease

## 2024-05-06 NOTE — Telephone Encounter (Signed)
 [facility] [employer] about a fax they have sent twice. The fax is requesting a decrease of Eliquis  from 5mg  2.5mg . Please advise.

## 2024-05-07 NOTE — Telephone Encounter (Signed)
 Scr had previously been elevated however last BMP on 02/04/24 showed Scr of 1.3 indicating patient's dose should remain 5mg  BID

## 2024-05-08 ENCOUNTER — Telehealth: Payer: Self-pay | Admitting: Cardiovascular Disease

## 2024-05-08 NOTE — Telephone Encounter (Signed)
 [name], [name], [facility]     05/07/24  8:11 AM Note Scr had previously been elevated however last BMP on 02/04/24 showed Scr of 1.3 indicating patient's dose should remain 5mg  BID          Called back and spoke with Corean at [facility] regarding pt's Eliquis  dosing. Advised of our PharmD's recommendations to not change dosage at this time. Corean documents this. Advised that since last labs were done in July pt will be due for another set of labs in January. Also, noted that pt is due to be seen back now in October for a 6 month follow up with an APP per Dr. Court. Scheduled follow up visit for pt to see Scot Ford, PA on 10/21. Corean notes this in pt's chart. No further questions at this time.

## 2024-05-08 NOTE — Telephone Encounter (Signed)
 Nathan Santiago, Nathan Santiago, [facility]     05/07/24  8:11 AM Note Scr had previously been elevated however last BMP on 02/04/24 showed Scr of 1.3 indicating patient's dose should remain 5mg  BID          Called back and spoke with Corean at [facility] regarding pt's Eliquis  dosing. Advised of our PharmD's recommendations to not change dosage at this time. Corean documents this. Advised that since last labs were done in July pt will be due for another set of labs in January. Also, noted that pt is due to be seen back now in October for a 6 month follow up with an APP per Dr. Court. Scheduled follow up visit for pt to see Scot Ford, PA on 10/21. Corean notes this in pt's chart. No further questions at this time.

## 2024-05-08 NOTE — Telephone Encounter (Signed)
 Nursing home ([facility]) called to f/u on fax regarding medication. Please Advise

## 2024-05-09 ENCOUNTER — Other Ambulatory Visit (HOSPITAL_COMMUNITY): Payer: Self-pay

## 2024-05-19 ENCOUNTER — Non-Acute Institutional Stay (SKILLED_NURSING_FACILITY): Payer: Self-pay | Admitting: Internal Medicine

## 2024-05-19 DIAGNOSIS — I739 Peripheral vascular disease, unspecified: Secondary | ICD-10-CM

## 2024-05-19 DIAGNOSIS — I4891 Unspecified atrial fibrillation: Secondary | ICD-10-CM

## 2024-05-19 DIAGNOSIS — D509 Iron deficiency anemia, unspecified: Secondary | ICD-10-CM

## 2024-05-19 DIAGNOSIS — N1832 Chronic kidney disease, stage 3b: Secondary | ICD-10-CM

## 2024-05-19 DIAGNOSIS — E1122 Type 2 diabetes mellitus with diabetic chronic kidney disease: Secondary | ICD-10-CM | POA: Diagnosis not present

## 2024-05-19 DIAGNOSIS — I10 Essential (primary) hypertension: Secondary | ICD-10-CM

## 2024-05-19 DIAGNOSIS — E782 Mixed hyperlipidemia: Secondary | ICD-10-CM | POA: Diagnosis not present

## 2024-05-19 DIAGNOSIS — I5032 Chronic diastolic (congestive) heart failure: Secondary | ICD-10-CM

## 2024-05-19 DIAGNOSIS — G629 Polyneuropathy, unspecified: Secondary | ICD-10-CM | POA: Diagnosis not present

## 2024-05-19 NOTE — Progress Notes (Unsigned)
 Location:  Medical Illustrator of Service:  SNF (31)  Provider:   Code Status: DNR Goals of Care:     02/20/2024    2:18 PM  Advanced Directives  Does Patient Have a Medical Advance Directive? Yes  Type of Estate Agent of [place];Living will;Out of facility DNR (pink MOST or yellow form)  Does patient want to make changes to medical advance directive? No - Patient declined  Copy of Healthcare Power of Attorney in Chart? Yes - validated most recent copy scanned in chart (See row information)  Pre-existing out of facility DNR order (yellow form or pink MOST form) Yellow form placed in chart (order not valid for inpatient use)     Chief Complaint  Patient presents with   Care Management    HPI: Patient is a 89 y.o. male seen today for medical management of chronic diseases.   Lives in SNF in [place]   h/o Urinary Retention with Hydronephrosis and stent  Now has Chronic Foley   Mild Cognitive impairment MMSE 27/30 in 03/24 Stays in SNF Uses Wheelchair and needs Stand up lift for transfers  He has Severe PAD with ICA stenosis and Renal Artery Stenosis  S/p THA in 12/23 H/o CHF   BP has been doing good lately on Number of Meds  CBGS are mostly 150-200 Last A1C was elevated  Now has Chronic Foley  Today was c/o Pain in his plantar area of Foot Wants to know if he can see his Podiatrist  Wt Readings from Last 3 Encounters:  05/20/24 190 lb (86.2 kg)  05/19/24 190 lb (86.2 kg)  04/17/24 187 lb 14.4 oz (85.2 kg)      Past Medical History:  Diagnosis Date   AAA (abdominal aortic aneurysm)    asymptomatic   AAA (abdominal aortic aneurysm) 2010   peripheral  angiogram-- bilateral SFA DISEASE  and 60 to 70% infrarenaal abd. aortic stenosis with 15 -mm gradient   Adenomatous colon polyp 11/2003   CAD (coronary artery disease)    Gait abnormality 02/13/2017   Gastroparesis    pt denies   GERD (gastroesophageal reflux  disease)    w/ LPR   History of kidney stones    x2   Hyperlipidemia    Hypertension    IDA (iron  deficiency anemia)    Peripheral arterial disease    RCEA  by Dr Medford Brunswick   PUD (peptic ulcer disease)    pt unaware   Vertigo     Past Surgical History:  Procedure Laterality Date   ABDOMINAL AORTOGRAM N/A 11/19/2023   Procedure: ABDOMINAL AORTOGRAM;  Surgeon: Sheree Penne Bruckner, MD;  Location: [facility] INVASIVE CV LAB;  Service: Cardiovascular;  Laterality: N/A;   ABDOMINAL AORTOGRAM W/LOWER EXTREMITY Bilateral 08/05/2018   Procedure: ABDOMINAL AORTOGRAM W/LOWER EXTREMITY;  Surgeon: Court Dorn PARAS, MD;  Location: [facility] INVASIVE CV LAB;  Service: Cardiovascular;  Laterality: Bilateral;   ABDOMINAL AORTOGRAM W/LOWER EXTREMITY N/A 09/26/2021   Procedure: ABDOMINAL AORTOGRAM W/LOWER EXTREMITY;  Surgeon: Court Dorn PARAS, MD;  Location: [facility] INVASIVE CV LAB;  Service: Cardiovascular;  Laterality: N/A;   AORTOGRAM  04/24/2016    Abdominal aortogram, bilateral iliac angiogram, bifemoral runoff   CARDIAC CATHETERIZATION  05/12/2005   RCA   carotid doppler  01/24/2013   RICA endarterectomy,left CCA 0-49%; left bulb and prox ICA 50-69%; bilaateral subclavian < 50%   CAROTID ENDARTERECTOMY  11/01/2010   CATARACT EXTRACTION     COLONOSCOPY  CORONARY ANGIOPLASTY  05/18/2005   2 STENTS distal RCA AND PROXIMAL-MID RCA   5 total stents per pt.   CYSTOSCOPY/URETEROSCOPY/HOLMIUM LASER/STENT PLACEMENT Left 01/11/2018   Procedure: LEFT URETEROSCOPY/HOLMIUM LASER/STENT PLACEMENT;  Surgeon: Cam Morene ORN, MD;  Location: [facility] ORS;  Service: Urology;  Laterality: Left;   DOPPLER ECHOCARDIOGRAPHY  02/07/2012   EF 55%,SHOWED NO ISCHEMIA    HERNIA REPAIR     umbilical   lower arterial  doppler  02/04/2013   aotra 1.5 x 1.5 cm; distal abd aorta 70-99%,proximal common iliac arteries -very stenotic with increased velocities>50%,may be falsely elevated as a result of residual plaque from the distal aorta  stenosis   LOWER EXTREMITY ANGIOGRAPHY N/A 11/19/2023   Procedure: Lower Extremity Angiography;  Surgeon: Sheree Penne Bruckner, MD;  Location: [facility] INVASIVE CV LAB;  Service: Cardiovascular;  Laterality: N/A;   lower extremity doppler  June 18 ,2013   ABI'S ABNORMAL, RABI was 0.88 and LABI 0.75 ,with 3-vessel  run off   NM MYOVIEW  LTD  MAY 23,2011   showed no significant ischemia;   NM MYOVIEW  LTD  04/22/2008   ef 77%,exercise capcity 6 METS ,exaggerated blood pressure response to exercise   PERIPHERAL VASCULAR CATHETERIZATION N/A 04/24/2016   Procedure: Lower Extremity Angiography;  Surgeon: Dorn JINNY Lesches, MD;  Location: [facility] INVASIVE CV LAB;  Service: Cardiovascular;  Laterality: N/A;   PERIPHERAL VASCULAR CATHETERIZATION  04/24/2016   Procedure: Peripheral Vascular Intervention;  Surgeon: Dorn JINNY Lesches, MD;  Location: [facility] INVASIVE CV LAB;  Service: Cardiovascular;;  Aorta   retrograde central aortic catheterization  05/19/2005   cutting balloon atherectomy, c-circ stenosis with DES STENTING CYPHER   TONSILLECTOMY     TOTAL HIP ARTHROPLASTY Right 07/11/2022   Procedure: TOTAL HIP ARTHROPLASTY ANTERIOR APPROACH;  Surgeon: Ernie Cough, MD;  Location: [facility];  Service: Orthopedics;  Laterality: Right;   TOTAL KNEE ARTHROPLASTY Left 01/03/2019   Procedure: TOTAL KNEE ARTHROPLASTY;  Surgeon: Kay Kemps, MD;  Location: [facility];  Service: Orthopedics;  Laterality: Left;  with IS block   TRANSURETHRAL RESECTION OF PROSTATE N/A 09/08/2016   Procedure: TRANSURETHRAL RESECTION OF THE PROSTATE (TURP);  Surgeon: Morene ORN Cam, MD;  Location: [facility];  Service: Urology;  Laterality: N/A;   TRANSURETHRAL RESECTION OF PROSTATE     10-10-17  Dr. Cam   TRANSURETHRAL RESECTION OF PROSTATE N/A 10/10/2017   Procedure: TRANSURETHRAL RESECTION OF THE PROSTATE (TURP);  Surgeon: Cam Morene ORN, MD;  Location: [facility];  Service: Urology;  Laterality: N/A;   VASECTOMY      Allergies  Allergen  Reactions   Lisinopril Cough   Niaspan [Niacin Er (Antihyperlipidemic)] Rash    Outpatient Encounter Medications as of 05/19/2024  Medication Sig   acetaminophen  (TYLENOL ) 500 MG tablet Take 1,000 mg by mouth 2 (two) times daily.   amLODipine  (NORVASC ) 10 MG tablet Take 10 mg by mouth in the morning. Hold if BP <95   apixaban  (ELIQUIS ) 5 MG TABS tablet Take 1 tablet (5 mg total) by mouth 2 (two) times daily.   Apoaequorin (PREVAGEN PO) Take 1 tablet by mouth in the morning.   atorvastatin  (LIPITOR) 40 MG tablet Take 40 mg by mouth daily at 6 PM.    Biotin  10 MG TABS Take 10 mg by mouth in the morning. Once a morning 8am-11am   cetirizine  (ZYRTEC ) 10 MG tablet Take 1 tablet (10 mg total) by mouth at bedtime.   cholecalciferol  (VITAMIN D3) 25 MCG (1000 UT) tablet Take 1,000 Units by  mouth in the morning.   cloNIDine  (CATAPRES ) 0.1 MG tablet Take 1 tablet (0.1 mg total) by mouth 3 (three) times daily. Hold if BP < 95   finasteride  (PROSCAR ) 5 MG tablet Take 5 mg by mouth in the morning.   furosemide  (LASIX ) 20 MG tablet Take 20 mg by mouth daily as needed (HOLD UNTIL 01/28/2024).   furosemide  (LASIX ) 40 MG tablet Take 1 tablet (40 mg total) by mouth daily. HOLD UNTIL 01/28/2024   gabapentin  (NEURONTIN ) 100 MG capsule Take 200 mg by mouth 3 (three) times daily.   Glucosamine-Chondroitin 750-600 MG TABS Take 1 tablet by mouth 2 (two) times daily.   hydrALAZINE  (APRESOLINE ) 50 MG tablet Take 50 mg by mouth 4 (four) times daily. Hold if BP <95   iron  polysaccharides (NIFEREX) 150 MG capsule Take 150 mg by mouth every Monday, Wednesday, and Friday.   melatonin 5 MG TABS Take 1 tablet (5 mg total) by mouth at bedtime.   metoprolol  tartrate (LOPRESSOR ) 50 MG tablet Take 75 mg by mouth 2 (two) times daily.   mineral oil-hydrophilic petrolatum (AQUAPHOR) ointment Apply topically at bedtime.   Multiple Vitamin (MULTIVITAMIN WITH MINERALS) TABS tablet Take 1 tablet by mouth in the morning.    pantoprazole  (PROTONIX ) 40 MG tablet Take 40 mg by mouth 2 (two) times daily.   polyethylene glycol (MIRALAX ) 17 g packet Take 17 g by mouth daily.   potassium chloride  SA (K-DUR,KLOR-CON ) 20 MEQ tablet Take 20 mEq by mouth every evening.    sennosides-docusate sodium  (SENOKOT-S) 8.6-50 MG tablet Take 2 tablets by mouth at bedtime.   tamsulosin  (FLOMAX ) 0.4 MG CAPS capsule Take 1 capsule (0.4 mg total) by mouth daily after supper.   valsartan  (DIOVAN ) 80 MG tablet Take 1.5 tablets (120 mg total) by mouth daily.  Give 120 mg by mouth in the morning for Hypertension Valsartan  120 mg QD   No facility-administered encounter medications on file as of 05/19/2024.    Review of Systems:  Review of Systems  Constitutional:  Negative for activity change, appetite change and unexpected weight change.  HENT: Negative.    Respiratory:  Negative for cough and shortness of breath.   Cardiovascular:  Negative for leg swelling.  Gastrointestinal:  Negative for constipation.  Genitourinary:  Negative for frequency.  Musculoskeletal:  Positive for gait problem. Negative for arthralgias and myalgias.  Skin: Negative.  Negative for rash.  Neurological:  Negative for dizziness and weakness.  Psychiatric/Behavioral:  Negative for confusion and sleep disturbance.   All other systems reviewed and are negative.   Health Maintenance  Topic Date Due   Influenza Vaccine  02/29/2024   COVID-19 Vaccine (7 - 2025-26 season) 03/31/2024   HEMOGLOBIN A1C  08/13/2024   OPHTHALMOLOGY EXAM  08/26/2024   Medicare Annual Wellness (AWV)  01/10/2025   Pneumococcal Vaccine: 50+ Years (3 of 3 - PCV) 01/11/2025   FOOT EXAM  02/27/2025   DTaP/Tdap/Td (3 - Tdap) 01/24/2034   Zoster Vaccines- Shingrix  Completed   Meningococcal B Vaccine  Aged Out    Physical Exam: Vitals:   05/19/24 1858  BP: (!) 152/64  Pulse: 64  Resp: 13  Temp: 97.7 F (36.5 C)  Weight: 190 lb (86.2 kg)   Body mass index is 32.61  kg/m. Physical Exam Vitals reviewed.  Constitutional:      Appearance: Normal appearance.  HENT:     Head: Normocephalic.     Nose: Nose normal.     Mouth/Throat:     Mouth:  Mucous membranes are moist.     Pharynx: Oropharynx is clear.  Eyes:     Pupils: Pupils are equal, round, and reactive to light.  Cardiovascular:     Rate and Rhythm: Normal rate. Rhythm irregular.     Pulses: Normal pulses.     Heart sounds: No murmur heard. Pulmonary:     Effort: Pulmonary effort is normal. No respiratory distress.     Breath sounds: Normal breath sounds. No rales.  Abdominal:     General: Abdomen is flat. Bowel sounds are normal.     Palpations: Abdomen is soft.  Musculoskeletal:        General: Swelling present.     Cervical back: Neck supple.  Skin:    General: Skin is warm.  Neurological:     General: No focal deficit present.     Mental Status: He is alert.  Psychiatric:        Mood and Affect: Mood normal.        Thought Content: Thought content normal.     Labs reviewed: Basic Metabolic Panel: Recent Labs    06/18/23 0000 07/30/23 0000 09/28/23 0236 09/29/23 0752 09/30/23 0231 10/01/23 0234 11/19/23 1128 12/18/23 0000 01/22/24 0000 01/28/24 0000 02/04/24 0000  NA 144   < > 142   < > 142 140 144   < > 141 140 143  K 4.3   < > 3.7   < > 3.4* 3.6 4.7   < > 3.9 4.3 4.2  CL 110*   < > 106   < > 107 106 108   < > 104 105 104  CO2 23*   < > 26   < > 24 24  --    < > 26* 21 23*  GLUCOSE  --    < > 120*   < > 111* 102* 136*  --   --   --   --   BUN 29*   < > 49*   < > 37* 32* 29*   < > 63* 58* 40*  CREATININE 1.3   < > 1.99*   < > 1.37* 1.16 1.20   < > 2.3* 1.8* 1.3  CALCIUM  9.4   < > 8.7*   < > 8.8* 8.8*  --    < > 9.1 9.5 9.3  MG  --   --  2.1  --   --  2.2  --   --   --   --   --   TSH 6.02*  --   --   --   --   --   --   --   --   --   --    < > = values in this interval not displayed.   Liver Function Tests: Recent Labs    06/18/23 0000 01/22/24 0000  AST  16 14  ALT 15 16  ALKPHOS 91 96  ALBUMIN 3.9 3.9   No results for input(s): LIPASE, AMYLASE in the last 8760 hours. No results for input(s): AMMONIA in the last 8760 hours. CBC: Recent Labs    09/25/23 2212 09/27/23 0228 09/29/23 0752 09/30/23 0231 11/19/23 1128 01/22/24 0000  WBC 7.4 9.3 9.4 7.1  --  9.1  NEUTROABS 5.6  --   --   --   --   --   HGB 11.7* 10.3* 11.1* 10.0* 12.2* 12.4*  HCT 36.7* 31.0* 33.3* 30.9* 36.0* 36*  MCV 91.5 88.3 88.6 90.4  --   --  PLT 181 187 185 184  --  211   Lipid Panel: Recent Labs    02/11/24 0000  CHOL 134  HDL 38  LDLCALC 59  TRIG 185*   Lab Results  Component Value Date   HGBA1C 7.9 02/11/2024    Procedures since last visit: No results found.  Assessment/Plan 1. Type 2 diabetes mellitus with stage 3b chronic kidney disease, without long-term current use of insulin (HCC) (Primary) CBGS are running high Will Check A1C again If elevated consider Metformin  2. Peripheral arterial disease Follows with Vascular  No Intervention was done as his Ulcer healed  3. Mixed hyperlipidemia On statin Last LDL 59  4. Iron  deficiency anemia, unspecified iron  deficiency anemia type On Iron     5. Essential hypertension BP has been better controlled recently He has Renal Artery Hypertension  6. Chronic diastolic congestive heart failure (HCC) Doing well on Lasix   7. Atrial fibrillation with rapid ventricular response (HCC) Doing well on Eliquis  and Metoprolol   8. Elevated Uric Acid Will start on Allopurinol 100 mg Qd   9Stage 3b chronic kidney disease (HCC)  Creat stable  10 Chornic Foley Per Urology Patient has preferred with Foley They said to continue instead of Doing Voidal trial Also on Proscar  and Flomax   11 Gerd On PPI   Labs/tests ordered:  A1C Uric Acid in 4 weeks Next appt:  Visit date not found   Total time spent in this patient care encounter was  45_  minutes; greater than 50% of the visit spent  counseling patient and staff, reviewing records , Labs and coordinating care for problems addressed at this encounter.

## 2024-05-20 ENCOUNTER — Ambulatory Visit: Attending: Physician Assistant | Admitting: Physician Assistant

## 2024-05-20 VITALS — BP 110/48 | HR 54 | Ht 64.0 in | Wt 190.0 lb

## 2024-05-20 DIAGNOSIS — I480 Paroxysmal atrial fibrillation: Secondary | ICD-10-CM

## 2024-05-20 DIAGNOSIS — E785 Hyperlipidemia, unspecified: Secondary | ICD-10-CM | POA: Diagnosis not present

## 2024-05-20 DIAGNOSIS — I2510 Atherosclerotic heart disease of native coronary artery without angina pectoris: Secondary | ICD-10-CM

## 2024-05-20 DIAGNOSIS — I739 Peripheral vascular disease, unspecified: Secondary | ICD-10-CM | POA: Diagnosis not present

## 2024-05-20 NOTE — Patient Instructions (Signed)
 Medication Instructions:   Your physician recommends that you continue on your current medications as directed. Please refer to the Current Medication list given to you today.   *If you need a refill on your cardiac medications before your next appointment, please call your pharmacy*   Lab Work: NONE ORDERED  TODAY     If you have labs (blood work) drawn today and your tests are completely normal, you will receive your results only by: MyChart Message (if you have MyChart) OR A paper copy in the mail If you have any lab test that is abnormal or we need to change your treatment, we will call you to review the results.    Testing/Procedures:NONE ORDERED  TODAY     Follow-Up: At [facility], you and your health needs are our priority.  As part of our continuing mission to provide you with exceptional heart care, our providers are all part of one team.  This team includes your primary Cardiologist (physician) and Advanced Practice Providers or APPs (Physician Assistants and Nurse Practitioners) who all work together to provide you with the care you need, when you need it.   Your next appointment:    6 month(s)  Provider:    Dorn Lesches, MD    We recommend signing up for the patient portal called MyChart.  Sign up information is provided on this After Visit Summary.  MyChart is used to connect with patients for Virtual Visits (Telemedicine).  Patients are able to view lab/test results, encounter notes, upcoming appointments, etc.  Non-urgent messages can be sent to your provider as well.   To learn more about what you can do with MyChart, go to [email or web address].   Other Instructions

## 2024-05-20 NOTE — Progress Notes (Unsigned)
 Cardiology Office Note   Date:  05/22/2024  ID:  Nathan Santiago April 10, 1931, MRN [id number] PCP: Charlanne Fredia CROME, MD  [facility] Providers Cardiologist:  Dorn Lesches, MD     History of Present Illness Nathan Santiago is a 89 y.o. male with a hx of CAD, AAA, PAF, hypertension, hyperlipidemia, carotid artery disease and PAD.  He was a former patient of Dr. Maye.  He previously underwent stenting of proximal, mid and distal RCA using overlapping stents as well as proximal AV groove circumflex with known occluded OM branch in 2006.  Myoview  was nonischemic in 2010.  Echocardiogram in July 2013 showed normal EF.  He previously underwent right CEA by vascular surgery for carotid artery disease.  He underwent lower extremity angiography in September 2017 revealing 95% distal abdominal aortic stenosis with 60 mm pullback gradient, 95% calcified distal left common femoral artery stenosis, 80% segmental mid right SFA stenosis with two-vessel runoff.  Distal abdominal aorta was stented with a 10 x 29 mm balloon expandable stent.  His claudication symptom subsequently resolved.  He underwent outpatient peripheral angiography in January 2020 revealing patent distal abdominal aortic stent without gradient, otherwise unchanged anatomy with 95% calcified left common femoral artery stenosis and bilateral moderate SFA disease.  He had a recurrent claudication symptom in 2022.  Doppler ultrasound showed patent distal aortic stent and iliac arteries with high-frequency signal in the distal left common femoral and distal right SFA.  He underwent peripheral angiography in February 2023 revealing 90% left renal artery stenosis, patent distal abdominal aortic stent and high-grade calcified left greater than right common femoral artery stenosis and focal bilateral mid SFA disease.  He was referred to Dr. Eliza for consideration of common femoral enterectomy.  Dr. Eliza felt the patient was not a good  candidate because of his age and comorbidities.  Carotid Doppler obtained in January 2024 demonstrated 1 to 39% disease using right ICA, 40 to 59% disease in the left ICA, antegrade vertebral artery flow with high resistance flow in the right vertebral artery.    He was previously admitted in October 2023 for AKI secondary to urinary retention. Renal ultrasound showed mild right hydronephrosis. He was treated with IV fluid and a Foley catheter. Urology service recommended outpatient voiding trial. He was discharged with a Foley. Due to back issue, MRI of the lumbar spine was ordered which demonstrated nonacute compression fracture in the setting of hemangioma, L1 height loss. He was referred to Dr. Rockney of neurosurgery. He returned in the hospital in December 2023 after mechanical fall that resulted in subcapital femoral neck fracture. He was seen by Dr. Emmit and underwent a right total hip surgery on 07/11/2022. Hospital course complicated by AKI resolved with IV fluid.  I previously attempted to switch him from losartan  to switched irbesartan  for better blood pressure control, however he did not respond to irbesartan  as well as losartan .  He eventually went back to losartan  50 mg twice a day.  I last saw the patient in January 2025, his blood pressure was well-controlled.  He did have 2+ pitting edema and a crackle in his lung, I recommended a basic metabolic panel, BMP and repeat echocardiogram.  Creatinine was 1.61.  BNP 266.9.  Echocardiogram obtained on 09/14/2023 showed EF 60 to 65%, mild LVH, normal RV, moderate LAE, trivial MR.  He was admitted to the hospital in March 2025 with diastolic heart failure, UTI and new onset of A-fib.  He was started on Eliquis ,  Plavix  stopped he underwent peripheral angiography by Dr. Sheree due to a nonhealing ulcer of his right heel.  Right common femoral artery enterectomy and patch angioplasty was being considered.  He was last seen by Dr. Court in April 2025 at which  time he was maintaining sinus rhythm.  Patient presents today for follow-up accompanied by wife.  They both resides in [facility] assisted [facility].  She is reliant on electric wheelchair to get around, he cannot walk.  Initial blood pressure was low in the high 90s, repeat blood pressure by myself  was 110/48.  He feels good.  He has no significant lower extremity edema.  He has weak pulses but no lower extremity ulcer on physical exam.  His feet are warm.  Family has decided to hold off on further intervention for his lower extremity and to continue to monitor.  He is aware to reach out to vascular surgery if he has worsening leg pain, discoloration or nonhealing ulcers.  He currently does not complain of any leg pain.  He has leg numbness and trace leg edema.  He has crackles in bilateral bases of the lung consistent with atelectasis.  He appears to be euvolemic.  I recommend to continue on the current therapy.  Recent blood work showed creatinine stable at 1.5.  He can follow-up with Dr. Court in 6 months.  We did not get a EKG today, however his heart is regular on exam.  ROS:   He denies chest pain, palpitations, dyspnea, pnd, orthopnea, n, v, dizziness, syncope, edema, weight gain, or early satiety. All other systems reviewed and are otherwise negative except as noted above.    Studies Reviewed      Cardiac Studies & Procedures   ______________________________________________________________________________________________     ECHOCARDIOGRAM  ECHOCARDIOGRAM COMPLETE 09/14/2023  Narrative ECHOCARDIOGRAM REPORT    Patient Name:   Nathan Santiago [facility] Date of Exam: 09/14/2023 Medical Rec #:  [id number]         Height:       64.0 in Accession #:    [id number]        Weight:       182.8 lb Date of Birth:  08-May-1931         BSA:          1.883 m Patient Age:    89 years          BP:           128/64 mmHg Patient Gender: M                 HR:           57 bpm. Exam Location:  [address]  Procedure: 2D Echo, 3D Echo and Strain Analysis (Both Spectral and Color Flow Doppler were utilized during procedure).  Indications:    R60.0 Lower extremity edema  History:        Patient has prior history of Echocardiogram examinations, most recent 02/07/2012. CAD, PAD; Risk Factors:Hypertension and Dyslipidemia. Carotid artery disease.  Sonographer:    Jon Hacker RCS Referring Phys: Jaisa Defino  IMPRESSIONS   1. Left ventricular ejection fraction, by estimation, is 60 to 65%. Left ventricular ejection fraction by 3D volume is 63 %. The left ventricle has normal function. The left ventricle has no regional wall motion abnormalities. There is mild concentric left ventricular hypertrophy. Left ventricular diastolic parameters are consistent with Grade I diastolic dysfunction (impaired relaxation). The average left ventricular global longitudinal strain is -24.2 %.  The global longitudinal strain is normal. 2. Right ventricular systolic function is normal. The right ventricular size is normal. 3. Left atrial size was mildly dilated. 4. The mitral valve is normal in structure. Trivial mitral valve regurgitation. No evidence of mitral stenosis. 5. The aortic valve is tricuspid. There is mild calcification of the aortic valve. Aortic valve regurgitation is not visualized. Aortic valve sclerosis/calcification is present, without any evidence of aortic stenosis. 6. The inferior vena cava is normal in size with <50% respiratory variability, suggesting right atrial pressure of 8 mmHg.  FINDINGS Left Ventricle: Left ventricular ejection fraction, by estimation, is 60 to 65%. Left ventricular ejection fraction by 3D volume is 63 %. The left ventricle has normal function. The left ventricle has no regional wall motion abnormalities. The average left ventricular global longitudinal strain is -24.2 %. Strain was performed and the global longitudinal strain is normal. The left ventricular  internal cavity size was normal in size. There is mild concentric left ventricular hypertrophy. Left ventricular diastolic parameters are consistent with Grade I diastolic dysfunction (impaired relaxation).  Right Ventricle: The right ventricular size is normal. No increase in right ventricular wall thickness. Right ventricular systolic function is normal.  Left Atrium: Left atrial size was mildly dilated.  Right Atrium: Right atrial size was normal in size.  Pericardium: There is no evidence of pericardial effusion.  Mitral Valve: The mitral valve is normal in structure. Mild mitral annular calcification. Trivial mitral valve regurgitation. No evidence of mitral valve stenosis.  Tricuspid Valve: The tricuspid valve is normal in structure. Tricuspid valve regurgitation is trivial. No evidence of tricuspid stenosis.  Aortic Valve: The aortic valve is tricuspid. There is mild calcification of the aortic valve. Aortic valve regurgitation is not visualized. Aortic valve sclerosis/calcification is present, without any evidence of aortic stenosis.  Pulmonic Valve: The pulmonic valve was normal in structure. Pulmonic valve regurgitation is mild. No evidence of pulmonic stenosis.  Aorta: The aortic root is normal in size and structure.  Venous: The inferior vena cava is normal in size with less than 50% respiratory variability, suggesting right atrial pressure of 8 mmHg.  IAS/Shunts: No atrial level shunt detected by color flow Doppler.  Additional Comments: 3D was performed not requiring image post processing on an independent workstation and was normal.   LEFT VENTRICLE PLAX 2D LVIDd:         4.70 cm         Diastology LVIDs:         2.40 cm         LV e' medial:    5.18 cm/s LV PW:         1.20 cm         LV E/e' medial:  17.1 LV IVS:        1.10 cm         LV e' lateral:   6.23 cm/s LVOT diam:     2.00 cm         LV E/e' lateral: 14.2 LV SV:         78 LV SV Index:   42               2D LVOT Area:     3.14 cm        Longitudinal Strain 2D Strain GLS  -24.2 % Avg:  3D Volume EF LV 3D EF:    Left ventricul ar ejection fraction by 3D volume is 63 %.  3D Volume EF: 3D  EF:        63 % LV EDV:       82 ml LV ESV:       30 ml LV SV:        51 ml  RIGHT VENTRICLE RV Basal diam:  3.30 cm RV S prime:     10.80 cm/s TAPSE (M-mode): 1.6 cm  LEFT ATRIUM             Index        RIGHT ATRIUM           Index LA diam:        5.00 cm 2.66 cm/m   RA Area:     22.50 cm LA Vol (A2C):   54.7 ml 29.05 ml/m  RA Volume:   65.90 ml  35.00 ml/m LA Vol (A4C):   53.4 ml 28.36 ml/m LA Biplane Vol: 58.9 ml 31.28 ml/m AORTIC VALVE             PULMONIC VALVE LVOT Vmax:   82.20 cm/s  PR End Diast Vel: 7.18 msec LVOT Vmean:  50.900 cm/s LVOT VTI:    0.249 m  AORTA Ao Root diam: 3.30 cm Ao Asc diam:  3.20 cm  MITRAL VALVE MV Area (PHT): 2.62 cm    SHUNTS MV Decel Time: 290 msec    Systemic VTI:  0.25 m MV E velocity: 88.60 cm/s  Systemic Diam: 2.00 cm MV A velocity: 96.40 cm/s MV E/A ratio:  0.92  Toribio Fuel MD Electronically signed by Toribio Fuel MD Signature Date/Time: 09/14/2023/12:04:16 PM    Final          ______________________________________________________________________________________________      Risk Assessment/Calculations  CHA2DS2-VASc Score = 4   This indicates a 4.8% annual risk of stroke. The patient's score is based upon: CHF History: 0 HTN History: 1 Diabetes History: 0 Stroke History: 0 Vascular Disease History: 1 Age Score: 2 Gender Score: 0           Physical Exam VS:  BP (!) 110/48   Pulse (!) 54   Ht 5' 4 (1.626 m)   Wt 190 lb (86.2 kg)   SpO2 92%   BMI 32.61 kg/m        Wt Readings from Last 3 Encounters:  05/20/24 190 lb (86.2 kg)  04/17/24 187 lb 14.4 oz (85.2 kg)  03/17/24 190 lb 3.2 oz (86.3 kg)    GEN: Well nourished, well developed in no acute distress NECK: No JVD; No carotid  bruits CARDIAC: RRR, no murmurs, rubs, gallops RESPIRATORY:  Clear to auscultation without rales, wheezing or rhonchi  ABDOMEN: Soft, non-tender, non-distended EXTREMITIES:  No edema; No deformity   ASSESSMENT AND PLAN  CAD: Denies any recent chest pain.  Not on aspirin  given the need for Eliquis .  Continue atorvastatin  40 mg daily  PAF: On Eliquis  and metoprolol  tartrate 75 mg twice a day.  Heart rate regular on exam today  PAD: Previously seen by Dr. Sheree who was considering possibility of right common femoral endarterectomy and patch angioplasty, he never went through with the procedure as lower extremity ulcer has healed.  His feet are warm, however pulses are weak.  Hyperlipidemia: Continue atorvastatin  40 mg daily         Dispo: Follow-up with Dr. Wadie in 6 months  Signed, Janica Eldred, [place]

## 2024-05-22 ENCOUNTER — Ambulatory Visit

## 2024-05-25 ENCOUNTER — Encounter: Payer: Self-pay | Admitting: Internal Medicine

## 2024-05-26 MED ORDER — ALLOPURINOL 100 MG PO TABS: 100.0000 mg | ORAL_TABLET | Freq: Every day | ORAL | Status: DC

## 2024-06-19 ENCOUNTER — Non-Acute Institutional Stay (SKILLED_NURSING_FACILITY): Payer: Self-pay | Admitting: Adult Health

## 2024-06-19 DIAGNOSIS — R413 Other amnesia: Secondary | ICD-10-CM

## 2024-06-19 DIAGNOSIS — N401 Enlarged prostate with lower urinary tract symptoms: Secondary | ICD-10-CM

## 2024-06-19 DIAGNOSIS — D509 Iron deficiency anemia, unspecified: Secondary | ICD-10-CM | POA: Diagnosis not present

## 2024-06-19 DIAGNOSIS — G629 Polyneuropathy, unspecified: Secondary | ICD-10-CM

## 2024-06-19 DIAGNOSIS — I5032 Chronic diastolic (congestive) heart failure: Secondary | ICD-10-CM

## 2024-06-19 DIAGNOSIS — E1122 Type 2 diabetes mellitus with diabetic chronic kidney disease: Secondary | ICD-10-CM

## 2024-06-19 DIAGNOSIS — M10371 Gout due to renal impairment, right ankle and foot: Secondary | ICD-10-CM

## 2024-06-19 DIAGNOSIS — K219 Gastro-esophageal reflux disease without esophagitis: Secondary | ICD-10-CM

## 2024-06-19 DIAGNOSIS — R7989 Other specified abnormal findings of blood chemistry: Secondary | ICD-10-CM

## 2024-06-19 DIAGNOSIS — E782 Mixed hyperlipidemia: Secondary | ICD-10-CM

## 2024-06-19 DIAGNOSIS — I10 Essential (primary) hypertension: Secondary | ICD-10-CM

## 2024-06-19 DIAGNOSIS — I480 Paroxysmal atrial fibrillation: Secondary | ICD-10-CM

## 2024-06-19 DIAGNOSIS — N1832 Chronic kidney disease, stage 3b: Secondary | ICD-10-CM

## 2024-06-20 ENCOUNTER — Encounter: Payer: Self-pay | Admitting: Adult Health

## 2024-06-20 DIAGNOSIS — N400 Enlarged prostate without lower urinary tract symptoms: Secondary | ICD-10-CM | POA: Insufficient documentation

## 2024-06-20 NOTE — Assessment & Plan Note (Signed)
 Foley out today Follow voiding protocol Followed by urology

## 2024-06-20 NOTE — Assessment & Plan Note (Signed)
 Continue current regimen At times the systolic is elevated but he has a low diastolic and he is a fall risk

## 2024-06-20 NOTE — Assessment & Plan Note (Signed)
 On PPI Hx of peptic ulcer

## 2024-06-20 NOTE — Assessment & Plan Note (Signed)
 Hgb 12.4 Hct 36  Continue iron 

## 2024-06-20 NOTE — Assessment & Plan Note (Signed)
 Memory loss No behaviors Doing well in skilled care

## 2024-06-20 NOTE — Progress Notes (Signed)
 Location:  [facility] Service:  SNF (31) Provider:  Tawni America, NP    Patient Care Team: Charlanne Fredia CROME, MD as PCP - General (Internal Medicine) Court Dorn PARAS, MD as PCP - Cardiology (Cardiology)  Extended Emergency Contact Information Primary Emergency Contact: Christyne Madeline LABOR Address: [address]          [place], [place] [address] [place] Home Phone: [phone] Mobile Phone: [phone] Relation: Spouse  Code Status:  DNR Goals of care: Advanced Directive information    02/20/2024    2:18 PM  Advanced Directives  Does Patient Have a Medical Advance Directive? Yes  Type of Estate Agent of [place];Living will;Out of facility DNR (pink MOST or yellow form)  Does patient want to make changes to medical advance directive? No - Patient declined  Copy of Healthcare Power of Attorney in Chart? Yes - validated most recent copy scanned in chart (See row information)  Pre-existing out of facility DNR order (yellow form or pink MOST form) Yellow form placed in chart (order not valid for inpatient use)     Chief Complaint  Patient presents with   Medical Management of Chronic Issues    HPI:  Pt is a 89 y.o. male seen today for medical management of chronic diseases.    Resides in SNF at [facility]  PMH of CAD, PAD followed with Dr Court, IDA, GERD, HTN,  gout, DM II, CKD, chronic cough, afib, CHF, PVD and HLD , impacted subcapital femoral neck fracture, underwent right THA by Dr. Ernie on 07/11/2022.  Has lumbar stenosis and hx of leg weakness with peripheral neuropathy Hx of AAA ICA stenosis and PAD, followed by Dr Court. Last carotid US  showing bilateral ICA stenosis with progression.  Renal artery stenosis left 90%. Also has femoral artery stenosis.   Type 2 DM A1C 7.06 February 2024 Trying diet modifications  CBGs improving 165-208  Gout with hyperuricemia: started on allopurinol  in  October uric acid 11.6 No new issues   Afib: no symptoms reported, on eliquis  for CVA risk reduction   PAD: remote hx of distal aortic stent Follows with vascular surgery Had a heel ulcer, non healing and surgical intervention was considered 11/19/23 surgery opted not to as the ulcer had healed.   HLD on atorvastatin   Lab Results  Component Value Date   LDLCALC 59 02/11/2024   IDA on iron  Lab Results  Component Value Date   HGB 12.4 (A) 01/22/2024   CKD  BUN 40.5 Cr 1.53 K 4.8  He has a hx of urinary retention now with a chronic foley which was removed today He has not voided yet it has been 4 hrs. No bladder pain or dysuria.   NO issues with constipation reported on miralax  and senna  Memory loss: MMSE 26/30 11/2022 [facility]  Needs assistance wth ADLs  Chronic cough: ?due to gerd. No lung disease noted per note by pulmonary 2019. Also hx of peptic ulcer.  ON PPI. ALso uses zyrtec   HTN has been difficult to control  Improved SBP 120-150/60-70  CHF no sob or doe EF 60-65% 09/14/23 Weight stable Wt Readings from Last 3 Encounters:  06/20/24 188 lb (85.3 kg)  05/20/24 190 lb (86.2 kg)  05/19/24 190 lb (86.2 kg)    Past Medical History:  Diagnosis Date   AAA (abdominal aortic aneurysm)    asymptomatic   AAA (abdominal aortic aneurysm) 2010   peripheral  angiogram-- bilateral SFA DISEASE  and 60 to 70% infrarenaal abd. aortic stenosis with 15 -mm gradient   Adenomatous colon polyp 11/2003   CAD (coronary artery disease)    Gait abnormality 02/13/2017   Gastroparesis    pt denies   GERD (gastroesophageal reflux disease)    w/ LPR   History of kidney stones    x2   Hyperlipidemia    Hypertension    IDA (iron  deficiency anemia)    Peripheral arterial disease    RCEA  by Dr Medford Brunswick   PUD (peptic ulcer disease)    pt unaware   Vertigo    Past Surgical History:  Procedure Laterality Date   ABDOMINAL AORTOGRAM N/A 11/19/2023   Procedure: ABDOMINAL AORTOGRAM;   Surgeon: Sheree Penne Bruckner, MD;  Location: [facility] INVASIVE CV LAB;  Service: Cardiovascular;  Laterality: N/A;   ABDOMINAL AORTOGRAM W/LOWER EXTREMITY Bilateral 08/05/2018   Procedure: ABDOMINAL AORTOGRAM W/LOWER EXTREMITY;  Surgeon: Court Dorn PARAS, MD;  Location: [facility] INVASIVE CV LAB;  Service: Cardiovascular;  Laterality: Bilateral;   ABDOMINAL AORTOGRAM W/LOWER EXTREMITY N/A 09/26/2021   Procedure: ABDOMINAL AORTOGRAM W/LOWER EXTREMITY;  Surgeon: Court Dorn PARAS, MD;  Location: [facility] INVASIVE CV LAB;  Service: Cardiovascular;  Laterality: N/A;   AORTOGRAM  04/24/2016    Abdominal aortogram, bilateral iliac angiogram, bifemoral runoff   CARDIAC CATHETERIZATION  05/12/2005   RCA   carotid doppler  01/24/2013   RICA endarterectomy,left CCA 0-49%; left bulb and prox ICA 50-69%; bilaateral subclavian < 50%   CAROTID ENDARTERECTOMY  11/01/2010   CATARACT EXTRACTION     COLONOSCOPY     CORONARY ANGIOPLASTY  05/18/2005   2 STENTS distal RCA AND PROXIMAL-MID RCA   5 total stents per pt.   CYSTOSCOPY/URETEROSCOPY/HOLMIUM LASER/STENT PLACEMENT Left 01/11/2018   Procedure: LEFT URETEROSCOPY/HOLMIUM LASER/STENT PLACEMENT;  Surgeon: Cam Morene ORN, MD;  Location: [facility] ORS;  Service: Urology;  Laterality: Left;   DOPPLER ECHOCARDIOGRAPHY  02/07/2012   EF 55%,SHOWED NO ISCHEMIA    HERNIA REPAIR     umbilical   lower arterial  doppler  02/04/2013   aotra 1.5 x 1.5 cm; distal abd aorta 70-99%,proximal common iliac arteries -very stenotic with increased velocities>50%,may be falsely elevated as a result of residual plaque from the distal aorta stenosis   LOWER EXTREMITY ANGIOGRAPHY N/A 11/19/2023   Procedure: Lower Extremity Angiography;  Surgeon: Sheree Penne Bruckner, MD;  Location: [facility] INVASIVE CV LAB;  Service: Cardiovascular;  Laterality: N/A;   lower extremity doppler  June 18 ,2013   ABI'S ABNORMAL, RABI was 0.88 and LABI 0.75 ,with 3-vessel  run off   NM MYOVIEW  LTD  MAY 23,2011   showed no  significant ischemia;   NM MYOVIEW  LTD  04/22/2008   ef 77%,exercise capcity 6 METS ,exaggerated blood pressure response to exercise   PERIPHERAL VASCULAR CATHETERIZATION N/A 04/24/2016   Procedure: Lower Extremity Angiography;  Surgeon: Dorn PARAS Court, MD;  Location: [facility] INVASIVE CV LAB;  Service: Cardiovascular;  Laterality: N/A;   PERIPHERAL VASCULAR CATHETERIZATION  04/24/2016   Procedure: Peripheral Vascular Intervention;  Surgeon: Dorn PARAS Court, MD;  Location: [facility] INVASIVE CV LAB;  Service: Cardiovascular;;  Aorta   retrograde central aortic catheterization  05/19/2005   cutting balloon atherectomy, c-circ stenosis with DES STENTING CYPHER   TONSILLECTOMY     TOTAL HIP ARTHROPLASTY Right 07/11/2022   Procedure: TOTAL HIP ARTHROPLASTY ANTERIOR APPROACH;  Surgeon: Ernie Cough, MD;  Location: [facility] ORS;  Service: Orthopedics;  Laterality: Right;   TOTAL KNEE ARTHROPLASTY Left 01/03/2019  Procedure: TOTAL KNEE ARTHROPLASTY;  Surgeon: Kay Kemps, MD;  Location: [facility] OR[facility];  Service: Orthopedics;  Laterality: Left;  with IS block   TRANSURETHRAL RESECTION OF PROSTATE N/A 09/08/2016   Procedure: TRANSURETHRAL RESECTION OF THE PROSTATE (TURP);  Surgeon: Morene LELON Salines, MD;  Location: [facility] OR[facility];  Service: Urology;  Laterality: N/A;   TRANSURETHRAL RESECTION OF PROSTATE     10-10-17  Dr. Salines   TRANSURETHRAL RESECTION OF PROSTATE N/A 10/10/2017   Procedure: TRANSURETHRAL RESECTION OF THE PROSTATE (TURP);  Surgeon: Salines Morene LELON, MD;  Location: [facility] OR[facility];  Service: Urology;  Laterality: N/A;   VASECTOMY      Allergies  Allergen Reactions   Lisinopril Cough   Niaspan [Niacin Er (Antihyperlipidemic)] Rash    Outpatient Encounter Medications as of 06/19/2024  Medication Sig   acetaminophen  (TYLENOL ) 500 MG tablet Take 1,000 mg by mouth 2 (two) times daily.   allopurinol  (ZYLOPRIM ) 100 MG tablet Take 1 tablet (100 mg total) by mouth daily.   amLODipine  (NORVASC ) 10 MG tablet Take 10 mg by  mouth in the morning. Hold if BP <95   apixaban  (ELIQUIS ) 5 MG TABS tablet Take 1 tablet (5 mg total) by mouth 2 (two) times daily.   Apoaequorin (PREVAGEN PO) Take 1 tablet by mouth in the morning.   atorvastatin  (LIPITOR) 40 MG tablet Take 40 mg by mouth daily at 6 PM.    Biotin  10 MG TABS Take 10 mg by mouth in the morning. Once a morning 8am-11am   cetirizine  (ZYRTEC ) 10 MG tablet Take 1 tablet (10 mg total) by mouth at bedtime.   cholecalciferol  (VITAMIN D3) 25 MCG (1000 UT) tablet Take 1,000 Units by mouth in the morning.   cloNIDine  (CATAPRES ) 0.1 MG tablet Take 1 tablet (0.1 mg total) by mouth 3 (three) times daily. Hold if BP < 95   finasteride  (PROSCAR ) 5 MG tablet Take 5 mg by mouth in the morning.   furosemide  (LASIX ) 20 MG tablet Take 20 mg by mouth daily as needed (HOLD UNTIL 01/28/2024).   furosemide  (LASIX ) 40 MG tablet Take 1 tablet (40 mg total) by mouth daily. HOLD UNTIL 01/28/2024   gabapentin  (NEURONTIN ) 100 MG capsule Take 200 mg by mouth 3 (three) times daily.   Glucosamine-Chondroitin 750-600 MG TABS Take 1 tablet by mouth 2 (two) times daily.   hydrALAZINE  (APRESOLINE ) 50 MG tablet Take 50 mg by mouth 4 (four) times daily. Hold if BP <95   iron  polysaccharides (NIFEREX) 150 MG capsule Take 150 mg by mouth every Monday, Wednesday, and Friday.   melatonin 5 MG TABS Take 1 tablet (5 mg total) by mouth at bedtime.   metoprolol  tartrate (LOPRESSOR ) 50 MG tablet Take 75 mg by mouth 2 (two) times daily.   mineral oil-hydrophilic petrolatum (AQUAPHOR) ointment Apply topically at bedtime.   Multiple Vitamin (MULTIVITAMIN WITH MINERALS) TABS tablet Take 1 tablet by mouth in the morning.   pantoprazole  (PROTONIX ) 40 MG tablet Take 40 mg by mouth 2 (two) times daily.   polyethylene glycol (MIRALAX ) 17 g packet Take 17 g by mouth daily.   potassium chloride  SA (K-DUR,KLOR-CON ) 20 MEQ tablet Take 20 mEq by mouth every evening.    sennosides-docusate sodium  (SENOKOT-S) 8.6-50 MG  tablet Take 2 tablets by mouth at bedtime.   tamsulosin  (FLOMAX ) 0.4 MG CAPS capsule Take 1 capsule (0.4 mg total) by mouth daily after supper.   valsartan  (DIOVAN ) 80 MG tablet Take 1.5 tablets (120 mg total) by mouth daily.  Give 120  mg by mouth in the morning for Hypertension Valsartan  120 mg QD   No facility-administered encounter medications on file as of 06/19/2024.    Review of Systems  Constitutional:  Negative for activity change, appetite change, chills, diaphoresis, fatigue, fever and unexpected weight change.  HENT:  Negative for congestion.   Respiratory:  Negative for cough, shortness of breath, wheezing and stridor.   Cardiovascular:  Positive for leg swelling. Negative for chest pain and palpitations.  Gastrointestinal:  Negative for abdominal distention, abdominal pain, constipation and diarrhea.  Genitourinary:  Negative for difficulty urinating and dysuria.  Musculoskeletal:  Negative for arthralgias, back pain, joint swelling and myalgias.  Skin:  Negative for rash.  Neurological:  Negative for dizziness, seizures, syncope, facial asymmetry, speech difficulty, weakness and headaches.  Hematological:  Negative for adenopathy. Does not bruise/bleed easily.  Psychiatric/Behavioral:  Negative for agitation, behavioral problems and confusion.        Memory loss    Immunization History  Administered Date(s) Administered    sv, Bivalent, Protein Subunit Rsvpref,pf (Abrysvo) 07/19/2022   Fluad  Quad(high Dose 65+) 05/02/2022, 05/22/2023   INFLUENZA, HIGH DOSE SEASONAL PF 03/31/2017, 03/29/2018   Influenza,inj,Quad PF,6+ Mos 04/25/2016, 05/06/2018   Influenza,inj,quad, With Preservative 05/26/2019   Influenza-Unspecified 05/14/2001, 05/31/2001, 05/14/2002, 05/15/2002, 05/18/2003, 05/25/2004, 04/30/2005, 05/29/2006, 05/01/2007, 05/11/2008, 05/31/2009, 05/31/2010, 04/30/2013, 05/31/2014, 03/31/2021   Moderna Covid-19 Vaccine Bivalent Booster 26yrs & up 08/31/2021, 05/22/2023    Moderna SARS-COV2 Booster Vaccination 06/15/2020, 11/19/2020, 01/02/2022   Moderna Sars-Covid-2 Vaccination 08/12/2019, 09/10/2019, 05/31/2022   Pfizer Covid-19 Vaccine Bivalent Booster 66yrs & up 11/23/2022   Pneumococcal Polysaccharide-23 05/02/2000, 09/24/2018   Pneumococcal-Unspecified 01/12/2024   Td 09/08/2013   Tetanus 01/25/2024   Zoster Recombinant(Shingrix) 10/04/2017, 01/23/2018   Pertinent  Health Maintenance Due  Topic Date Due   Influenza Vaccine  02/29/2024   HEMOGLOBIN A1C  08/13/2024   OPHTHALMOLOGY EXAM  08/26/2024   FOOT EXAM  02/27/2025      07/28/2022    9:00 AM 10/14/2022    7:21 AM 11/28/2022    2:50 PM 06/04/2023    4:59 PM 01/11/2024   11:18 AM  Fall Risk  Falls in the past year?  1 1 0 1  Was there an injury with Fall?  1 0 0 1  Fall Risk Category Calculator  3 1 0 2  (RETIRED) Patient Fall Risk Level High fall risk       Patient at Risk for Falls Due to   History of fall(s);Impaired balance/gait;Impaired mobility No Fall Risks History of fall(s)  Fall risk Follow up  Falls evaluation completed Falls evaluation completed;Education provided;Falls prevention discussed Falls evaluation completed Falls evaluation completed     Data saved with a previous flowsheet row definition   Functional Status Survey:    Vitals:   06/20/24 1302  Weight: 188 lb (85.3 kg)   Body mass index is 32.27 kg/m. Physical Exam Vitals and nursing note reviewed.  Constitutional:      Appearance: Normal appearance.  HENT:     Head: Normocephalic and atraumatic.  Cardiovascular:     Rate and Rhythm: Normal rate and regular rhythm.     Heart sounds: No murmur heard. Pulmonary:     Effort: Pulmonary effort is normal. No respiratory distress.     Breath sounds: Normal breath sounds. No wheezing.  Abdominal:     General: Bowel sounds are normal. There is no distension.     Palpations: Abdomen is soft.     Tenderness: There is no abdominal  tenderness.  Musculoskeletal:      Cervical back: No rigidity.     Right lower leg: Edema present.     Left lower leg: Edema present.  Lymphadenopathy:     Cervical: No cervical adenopathy.  Skin:    General: Skin is warm and dry.  Neurological:     General: No focal deficit present.     Mental Status: He is alert. Mental status is at baseline.  Psychiatric:        Mood and Affect: Mood normal.     Labs reviewed: Recent Labs    09/28/23 0236 09/29/23 0752 09/30/23 0231 10/01/23 0234 11/19/23 1128 12/18/23 0000 01/22/24 0000 01/28/24 0000 02/04/24 0000  NA 142   < > 142 140 144   < > 141 140 143  K 3.7   < > 3.4* 3.6 4.7   < > 3.9 4.3 4.2  CL 106   < > 107 106 108   < > 104 105 104  CO2 26   < > 24 24  --    < > 26* 21 23*  GLUCOSE 120*   < > 111* 102* 136*  --   --   --   --   BUN 49*   < > 37* 32* 29*   < > 63* 58* 40*  CREATININE 1.99*   < > 1.37* 1.16 1.20   < > 2.3* 1.8* 1.3  CALCIUM  8.7*   < > 8.8* 8.8*  --    < > 9.1 9.5 9.3  MG 2.1  --   --  2.2  --   --   --   --   --    < > = values in this interval not displayed.   Recent Labs    01/22/24 0000  AST 14  ALT 16  ALKPHOS 96  ALBUMIN 3.9   Recent Labs    09/25/23 2212 09/27/23 0228 09/29/23 0752 09/30/23 0231 11/19/23 1128 01/22/24 0000  WBC 7.4 9.3 9.4 7.1  --  9.1  NEUTROABS 5.6  --   --   --   --   --   HGB 11.7* 10.3* 11.1* 10.0* 12.2* 12.4*  HCT 36.7* 31.0* 33.3* 30.9* 36.0* 36*  MCV 91.5 88.3 88.6 90.4  --   --   PLT 181 187 185 184  --  211   Lab Results  Component Value Date   TSH 6.02 (A) 06/18/2023   Lab Results  Component Value Date   HGBA1C 7.9 02/11/2024   Lab Results  Component Value Date   CHOL 134 02/11/2024   HDL 38 02/11/2024   LDLCALC 59 02/11/2024   TRIG 185 (A) 02/11/2024   CHOLHDL 2.4 11/23/2016    Significant Diagnostic Results in last 30 days:  No results found.  Assessment/Plan   Type 2 diabetes mellitus with diabetic chronic kidney disease (HCC) Not currently on meds A1C due next  week Consider metformin depending upon result   Neuropathy Continue neurontin   Memory loss Memory loss No behaviors Doing well in skilled care  Iron  deficiency anemia Hgb 12.4 Hct 36  Continue iron   Hyperlipidemia LDL 59 On lipitor   Gout attack No new issues On allopurinol    GERD On PPI Hx of peptic ulcer  A-fib (HCC) Controlled rate Followed by cardiology ON eliquis  for CVA risk reduction   Essential hypertension Continue current regimen At times the systolic is elevated but he has a low diastolic and he is a fall risk  BPH (benign prostatic hyperplasia) Foley out today Follow voiding protocol Followed by urology   Elevated TSH Lab Results  Component Value Date   TSH 6.02 (A) 06/18/2023   Recheck with next blood draw     Labs/tests ordered:  A1C TSH Uric acid

## 2024-06-20 NOTE — Assessment & Plan Note (Signed)
 Not currently on meds A1C due next week Consider metformin depending upon result

## 2024-06-20 NOTE — Assessment & Plan Note (Signed)
 Controlled rate Followed by cardiology ON eliquis  for CVA risk reduction

## 2024-06-20 NOTE — Assessment & Plan Note (Signed)
 LDL 59 On lipitor

## 2024-06-20 NOTE — Assessment & Plan Note (Signed)
 No new issues On allopurinol 

## 2024-06-20 NOTE — Assessment & Plan Note (Signed)
Continue neurontin 

## 2024-06-25 ENCOUNTER — Non-Acute Institutional Stay (SKILLED_NURSING_FACILITY): Payer: Self-pay | Admitting: Adult Health

## 2024-06-25 ENCOUNTER — Encounter: Payer: Self-pay | Admitting: Adult Health

## 2024-06-25 DIAGNOSIS — R051 Acute cough: Secondary | ICD-10-CM

## 2024-06-25 DIAGNOSIS — R531 Weakness: Secondary | ICD-10-CM | POA: Diagnosis not present

## 2024-06-25 DIAGNOSIS — H1033 Unspecified acute conjunctivitis, bilateral: Secondary | ICD-10-CM | POA: Diagnosis not present

## 2024-06-25 MED ORDER — IPRATROPIUM-ALBUTEROL 0.5-2.5 (3) MG/3ML IN SOLN: 3.0000 mL | Freq: Four times a day (QID) | RESPIRATORY_TRACT | Status: DC | PRN

## 2024-06-25 MED ORDER — ERYTHROMYCIN 5 MG/GM OP OINT
1.0000 | TOPICAL_OINTMENT | Freq: Four times a day (QID) | OPHTHALMIC | Status: AC
Start: 2024-06-25 — End: 2024-07-02

## 2024-06-25 NOTE — Progress Notes (Signed)
 Location:  Medical Illustrator of Service:  SNF (31) Provider:   Bari America, ANP [facility] [phone]   Charlanne Fredia CROME, MD  Patient Care Team: Charlanne Fredia CROME, MD as PCP - General (Internal Medicine) Court Dorn PARAS, MD as PCP - Cardiology (Cardiology)  Extended Emergency Contact Information Primary Emergency Contact: Christyne Madeline LABOR Address: [address]          [place], [place] [address] [place] Home Phone: [phone] Mobile Phone: [phone] Relation: Spouse  Code Status:  DNR Goals of care: Advanced Directive information    02/20/2024    2:18 PM  Advanced Directives  Does Patient Have a Medical Advance Directive? Yes  Type of Estate Agent of [place];Living will;Out of facility DNR (pink MOST or yellow form)  Does patient want to make changes to medical advance directive? No - Patient declined  Copy of Healthcare Power of Attorney in Chart? Yes - validated most recent copy scanned in chart (See row information)  Pre-existing out of facility DNR order (yellow form or pink MOST form) Yellow form placed in chart (order not valid for inpatient use)     Chief Complaint  Patient presents with   Acute Visit    Cough weakness low grade temp    HPI:  Pt is a 89 y.o. male seen today for an acute visit for cough, weakness, low grade temp, eye drainage  The above symptoms were noted last evening He needed more assistance with movement and was sleepy. He did wake up and eat a good breakfast.  O2 sat 90% on RA No sob Has a congested cough but no sore throat or body aches Covid and flu swab negative.  Abnormal lung sounds noted.  Circulating viral resp illness noted on unit  Also he has green discharge from his eyes. No change in vision or pain No gi symptoms Recently had a foley removed and is voiding per staff.    Past Medical History:  Diagnosis Date   AAA (abdominal aortic  aneurysm)    asymptomatic   AAA (abdominal aortic aneurysm) 2010   peripheral  angiogram-- bilateral SFA DISEASE  and 60 to 70% infrarenaal abd. aortic stenosis with 15 -mm gradient   Adenomatous colon polyp 11/2003   CAD (coronary artery disease)    Gait abnormality 02/13/2017   Gastroparesis    pt denies   GERD (gastroesophageal reflux disease)    w/ LPR   History of kidney stones    x2   Hyperlipidemia    Hypertension    IDA (iron  deficiency anemia)    Peripheral arterial disease    RCEA  by Dr Medford Brunswick   PUD (peptic ulcer disease)    pt unaware   Vertigo    Past Surgical History:  Procedure Laterality Date   ABDOMINAL AORTOGRAM N/A 11/19/2023   Procedure: ABDOMINAL AORTOGRAM;  Surgeon: Sheree Penne Bruckner, MD;  Location: [facility] INVASIVE CV LAB;  Service: Cardiovascular;  Laterality: N/A;   ABDOMINAL AORTOGRAM W/LOWER EXTREMITY Bilateral 08/05/2018   Procedure: ABDOMINAL AORTOGRAM W/LOWER EXTREMITY;  Surgeon: Court Dorn PARAS, MD;  Location: [facility] INVASIVE CV LAB;  Service: Cardiovascular;  Laterality: Bilateral;   ABDOMINAL AORTOGRAM W/LOWER EXTREMITY N/A 09/26/2021   Procedure: ABDOMINAL AORTOGRAM W/LOWER EXTREMITY;  Surgeon: Court Dorn PARAS, MD;  Location: [facility] INVASIVE CV LAB;  Service: Cardiovascular;  Laterality: N/A;   AORTOGRAM  04/24/2016    Abdominal aortogram, bilateral iliac angiogram, bifemoral runoff  CARDIAC CATHETERIZATION  05/12/2005   RCA   carotid doppler  01/24/2013   RICA endarterectomy,left CCA 0-49%; left bulb and prox ICA 50-69%; bilaateral subclavian < 50%   CAROTID ENDARTERECTOMY  11/01/2010   CATARACT EXTRACTION     COLONOSCOPY     CORONARY ANGIOPLASTY  05/18/2005   2 STENTS distal RCA AND PROXIMAL-MID RCA   5 total stents per pt.   CYSTOSCOPY/URETEROSCOPY/HOLMIUM LASER/STENT PLACEMENT Left 01/11/2018   Procedure: LEFT URETEROSCOPY/HOLMIUM LASER/STENT PLACEMENT;  Surgeon: Cam Morene ORN, MD;  Location: [facility] ORS;  Service: Urology;   Laterality: Left;   DOPPLER ECHOCARDIOGRAPHY  02/07/2012   EF 55%,SHOWED NO ISCHEMIA    HERNIA REPAIR     umbilical   lower arterial  doppler  02/04/2013   aotra 1.5 x 1.5 cm; distal abd aorta 70-99%,proximal common iliac arteries -very stenotic with increased velocities>50%,may be falsely elevated as a result of residual plaque from the distal aorta stenosis   LOWER EXTREMITY ANGIOGRAPHY N/A 11/19/2023   Procedure: Lower Extremity Angiography;  Surgeon: Sheree Penne Bruckner, MD;  Location: [facility] INVASIVE CV LAB;  Service: Cardiovascular;  Laterality: N/A;   lower extremity doppler  June 18 ,2013   ABI'S ABNORMAL, RABI was 0.88 and LABI 0.75 ,with 3-vessel  run off   NM MYOVIEW  LTD  MAY 23,2011   showed no significant ischemia;   NM MYOVIEW  LTD  04/22/2008   ef 77%,exercise capcity 6 METS ,exaggerated blood pressure response to exercise   PERIPHERAL VASCULAR CATHETERIZATION N/A 04/24/2016   Procedure: Lower Extremity Angiography;  Surgeon: Dorn JINNY Lesches, MD;  Location: [facility] INVASIVE CV LAB;  Service: Cardiovascular;  Laterality: N/A;   PERIPHERAL VASCULAR CATHETERIZATION  04/24/2016   Procedure: Peripheral Vascular Intervention;  Surgeon: Dorn JINNY Lesches, MD;  Location: [facility] INVASIVE CV LAB;  Service: Cardiovascular;;  Aorta   retrograde central aortic catheterization  05/19/2005   cutting balloon atherectomy, c-circ stenosis with DES STENTING CYPHER   TONSILLECTOMY     TOTAL HIP ARTHROPLASTY Right 07/11/2022   Procedure: TOTAL HIP ARTHROPLASTY ANTERIOR APPROACH;  Surgeon: Ernie Cough, MD;  Location: [facility] ORS;  Service: Orthopedics;  Laterality: Right;   TOTAL KNEE ARTHROPLASTY Left 01/03/2019   Procedure: TOTAL KNEE ARTHROPLASTY;  Surgeon: Kay Kemps, MD;  Location: [facility] ORS;  Service: Orthopedics;  Laterality: Left;  with IS block   TRANSURETHRAL RESECTION OF PROSTATE N/A 09/08/2016   Procedure: TRANSURETHRAL RESECTION OF THE PROSTATE (TURP);  Surgeon: Morene ORN Cam, MD;  Location: [facility]  ORS;  Service: Urology;  Laterality: N/A;   TRANSURETHRAL RESECTION OF PROSTATE     10-10-17  Dr. Cam   TRANSURETHRAL RESECTION OF PROSTATE N/A 10/10/2017   Procedure: TRANSURETHRAL RESECTION OF THE PROSTATE (TURP);  Surgeon: Cam Morene ORN, MD;  Location: [facility] ORS;  Service: Urology;  Laterality: N/A;   VASECTOMY      Allergies  Allergen Reactions   Lisinopril Cough   Niaspan [Niacin Er (Antihyperlipidemic)] Rash    Outpatient Encounter Medications as of 06/25/2024  Medication Sig   acetaminophen  (TYLENOL ) 500 MG tablet Take 1,000 mg by mouth 2 (two) times daily.   allopurinol  (ZYLOPRIM ) 100 MG tablet Take 1 tablet (100 mg total) by mouth daily.   amLODipine  (NORVASC ) 10 MG tablet Take 10 mg by mouth in the morning. Hold if BP <95   apixaban  (ELIQUIS ) 5 MG TABS tablet Take 1 tablet (5 mg total) by mouth 2 (two) times daily.   Apoaequorin (PREVAGEN PO) Take 1 tablet by mouth in the morning.  atorvastatin  (LIPITOR) 40 MG tablet Take 40 mg by mouth daily at 6 PM.    Biotin  10 MG TABS Take 10 mg by mouth in the morning. Once a morning 8am-11am   cetirizine  (ZYRTEC ) 10 MG tablet Take 1 tablet (10 mg total) by mouth at bedtime.   cholecalciferol  (VITAMIN D3) 25 MCG (1000 UT) tablet Take 1,000 Units by mouth in the morning.   cloNIDine  (CATAPRES ) 0.1 MG tablet Take 1 tablet (0.1 mg total) by mouth 3 (three) times daily. Hold if BP < 95   finasteride  (PROSCAR ) 5 MG tablet Take 5 mg by mouth in the morning.   furosemide  (LASIX ) 20 MG tablet Take 20 mg by mouth daily as needed (HOLD UNTIL 01/28/2024).   furosemide  (LASIX ) 40 MG tablet Take 1 tablet (40 mg total) by mouth daily. HOLD UNTIL 01/28/2024   gabapentin  (NEURONTIN ) 100 MG capsule Take 200 mg by mouth 3 (three) times daily.   Glucosamine-Chondroitin 750-600 MG TABS Take 1 tablet by mouth 2 (two) times daily.   hydrALAZINE  (APRESOLINE ) 50 MG tablet Take 50 mg by mouth 4 (four) times daily. Hold if BP <95   iron  polysaccharides  (NIFEREX) 150 MG capsule Take 150 mg by mouth every Monday, Wednesday, and Friday.   melatonin 5 MG TABS Take 1 tablet (5 mg total) by mouth at bedtime.   metoprolol  tartrate (LOPRESSOR ) 50 MG tablet Take 75 mg by mouth 2 (two) times daily.   mineral oil-hydrophilic petrolatum (AQUAPHOR) ointment Apply topically at bedtime.   Multiple Vitamin (MULTIVITAMIN WITH MINERALS) TABS tablet Take 1 tablet by mouth in the morning.   pantoprazole  (PROTONIX ) 40 MG tablet Take 40 mg by mouth 2 (two) times daily.   polyethylene glycol (MIRALAX ) 17 g packet Take 17 g by mouth daily.   potassium chloride  SA (K-DUR,KLOR-CON ) 20 MEQ tablet Take 20 mEq by mouth every evening.    sennosides-docusate sodium  (SENOKOT-S) 8.6-50 MG tablet Take 2 tablets by mouth at bedtime.   tamsulosin  (FLOMAX ) 0.4 MG CAPS capsule Take 1 capsule (0.4 mg total) by mouth daily after supper.   valsartan  (DIOVAN ) 80 MG tablet Take 1.5 tablets (120 mg total) by mouth daily.  Give 120 mg by mouth in the morning for Hypertension Valsartan  120 mg QD   No facility-administered encounter medications on file as of 06/25/2024.    Review of Systems  Constitutional:  Positive for activity change and fatigue. Negative for appetite change, chills, diaphoresis and fever.  HENT:  Negative for congestion.   Eyes:  Positive for discharge and redness. Negative for photophobia, pain, itching and visual disturbance.  Respiratory:  Positive for cough. Negative for shortness of breath and wheezing.   Cardiovascular:  Positive for leg swelling. Negative for chest pain.  Gastrointestinal:  Negative for abdominal distention, abdominal pain, constipation, diarrhea, nausea and vomiting.  Genitourinary:  Negative for difficulty urinating, dysuria and urgency.  Musculoskeletal:  Positive for gait problem. Negative for back pain, myalgias and neck pain.  Skin:  Negative for rash.  Neurological:  Positive for weakness. Negative for dizziness.   Psychiatric/Behavioral:  Negative for confusion.     Immunization History  Administered Date(s) Administered    sv, Bivalent, Protein Subunit Rsvpref,pf (Abrysvo) 07/19/2022   Fluad  Quad(high Dose 65+) 05/02/2022, 05/22/2023   INFLUENZA, HIGH DOSE SEASONAL PF 03/31/2017, 03/29/2018   Influenza,inj,Quad PF,6+ Mos 04/25/2016, 05/06/2018   Influenza,inj,quad, With Preservative 05/26/2019   Influenza-Unspecified 05/14/2001, 05/31/2001, 05/14/2002, 05/15/2002, 05/18/2003, 05/25/2004, 04/30/2005, 05/29/2006, 05/01/2007, 05/11/2008, 05/31/2009, 05/31/2010, 04/30/2013, 05/31/2014, 03/31/2021  Moderna Covid-19 Vaccine Bivalent Booster 45yrs & up 08/31/2021, 05/22/2023   Moderna SARS-COV2 Booster Vaccination 06/15/2020, 11/19/2020, 01/02/2022   Moderna Sars-Covid-2 Vaccination 08/12/2019, 09/10/2019, 05/31/2022   Pfizer Covid-19 Vaccine Bivalent Booster 33yrs & up 11/23/2022   Pneumococcal Polysaccharide-23 05/02/2000, 09/24/2018   Pneumococcal-Unspecified 01/12/2024   Td 09/08/2013   Tetanus 01/25/2024   Zoster Recombinant(Shingrix) 10/04/2017, 01/23/2018   Pertinent  Health Maintenance Due  Topic Date Due   Influenza Vaccine  02/29/2024   HEMOGLOBIN A1C  08/13/2024   OPHTHALMOLOGY EXAM  08/26/2024   FOOT EXAM  02/27/2025      07/28/2022    9:00 AM 10/14/2022    7:21 AM 11/28/2022    2:50 PM 06/04/2023    4:59 PM 01/11/2024   11:18 AM  Fall Risk  Falls in the past year?  1 1 0 1  Was there an injury with Fall?  1 0 0 1  Fall Risk Category Calculator  3 1 0 2  (RETIRED) Patient Fall Risk Level High fall risk       Patient at Risk for Falls Due to   History of fall(s);Impaired balance/gait;Impaired mobility No Fall Risks History of fall(s)  Fall risk Follow up  Falls evaluation completed Falls evaluation completed;Education provided;Falls prevention discussed Falls evaluation completed Falls evaluation completed     Data saved with a previous flowsheet row definition   Functional  Status Survey:    Vitals:   06/25/24 1255  BP: (!) 163/61  Pulse: 69  Resp: 16  Temp: 99.9 F (37.7 C)  SpO2: 90%   There is no height or weight on file to calculate BMI. Physical Exam Vitals and nursing note reviewed.  Constitutional:      Appearance: Normal appearance.  HENT:     Head: Normocephalic and atraumatic.     Right Ear: Tympanic membrane normal.     Left Ear: Tympanic membrane normal.     Nose: Nose normal.     Mouth/Throat:     Mouth: Mucous membranes are moist.     Pharynx: Oropharynx is clear.  Eyes:     General:        Right eye: Discharge present.        Left eye: Discharge present.    Conjunctiva/sclera: Conjunctivae normal.     Pupils: Pupils are equal, round, and reactive to light.     Comments: Erythema and slight swelling to upper lids.   Cardiovascular:     Rate and Rhythm: Normal rate and regular rhythm.     Heart sounds: No murmur heard. Pulmonary:     Effort: Pulmonary effort is normal. No respiratory distress.     Breath sounds: Rhonchi and rales present. No wheezing.  Abdominal:     General: Bowel sounds are normal. There is no distension.     Palpations: Abdomen is soft.     Tenderness: There is no abdominal tenderness.  Musculoskeletal:     Cervical back: No rigidity.     Right lower leg: Edema present.     Left lower leg: Edema present.  Lymphadenopathy:     Cervical: No cervical adenopathy.  Skin:    General: Skin is warm and dry.  Neurological:     General: No focal deficit present.     Mental Status: He is alert. Mental status is at baseline.  Psychiatric:        Mood and Affect: Mood normal.     Labs reviewed: Recent Labs    09/28/23 0236 09/29/23 9247  09/30/23 0231 10/01/23 0234 11/19/23 1128 12/18/23 0000 01/22/24 0000 01/28/24 0000 02/04/24 0000  NA 142   < > 142 140 144   < > 141 140 143  K 3.7   < > 3.4* 3.6 4.7   < > 3.9 4.3 4.2  CL 106   < > 107 106 108   < > 104 105 104  CO2 26   < > 24 24  --    < >  26* 21 23*  GLUCOSE 120*   < > 111* 102* 136*  --   --   --   --   BUN 49*   < > 37* 32* 29*   < > 63* 58* 40*  CREATININE 1.99*   < > 1.37* 1.16 1.20   < > 2.3* 1.8* 1.3  CALCIUM  8.7*   < > 8.8* 8.8*  --    < > 9.1 9.5 9.3  MG 2.1  --   --  2.2  --   --   --   --   --    < > = values in this interval not displayed.   Recent Labs    01/22/24 0000  AST 14  ALT 16  ALKPHOS 96  ALBUMIN 3.9   Recent Labs    09/25/23 2212 09/27/23 0228 09/29/23 0752 09/30/23 0231 11/19/23 1128 01/22/24 0000  WBC 7.4 9.3 9.4 7.1  --  9.1  NEUTROABS 5.6  --   --   --   --   --   HGB 11.7* 10.3* 11.1* 10.0* 12.2* 12.4*  HCT 36.7* 31.0* 33.3* 30.9* 36.0* 36*  MCV 91.5 88.3 88.6 90.4  --   --   PLT 181 187 185 184  --  211   Lab Results  Component Value Date   TSH 6.02 (A) 06/18/2023   Lab Results  Component Value Date   HGBA1C 7.9 02/11/2024   Lab Results  Component Value Date   CHOL 134 02/11/2024   HDL 38 02/11/2024   LDLCALC 59 02/11/2024   TRIG 185 (A) 02/11/2024   CHOLHDL 2.4 11/23/2016    Significant Diagnostic Results in last 30 days:  No results found.  Assessment/Plan  1. Acute cough (Primary)  - ipratropium-albuterol  (DUONEB) 0.5-2.5 (3) MG/3ML SOLN; Take 3 mLs by nebulization every 6 (six) hours as needed for up to 7 days. -borderline 02 sat, weakness, cough, and lethargy Obtain xray of the chest   2. Weakness Likely due to circulating viral illness.  Normal vitals Adequate appetite.  See above.   3. Acute bacterial conjunctivitis of both eyes  - erythromycin  ophthalmic ointment; Place 1 Application into both eyes 4 (four) times daily for 7 days.   Addendum: chest xray showed possible pneumonia vs atelectasis. Given his age, symptoms, will start Doxycycline 

## 2024-06-26 MED ORDER — DOXYCYCLINE HYCLATE 100 MG PO TABS: 100.0000 mg | ORAL_TABLET | Freq: Two times a day (BID) | ORAL | Status: AC

## 2024-06-26 NOTE — Addendum Note (Signed)
 Addended by: DARLEAN TAWNI CROME on: 06/26/2024 07:58 AM   Modules accepted: Orders

## 2024-07-07 ENCOUNTER — Non-Acute Institutional Stay (SKILLED_NURSING_FACILITY): Payer: Self-pay | Admitting: Internal Medicine

## 2024-07-07 ENCOUNTER — Emergency Department (HOSPITAL_COMMUNITY)

## 2024-07-07 ENCOUNTER — Other Ambulatory Visit: Payer: Self-pay

## 2024-07-07 ENCOUNTER — Encounter: Payer: Self-pay | Admitting: Internal Medicine

## 2024-07-07 ENCOUNTER — Inpatient Hospital Stay (HOSPITAL_COMMUNITY)
Admission: EM | Admit: 2024-07-07 | Discharge: 2024-07-10 | DRG: 682 | Disposition: A | Discharge status: Skilled Nursing Facility | Source: Skilled Nursing Facility | Attending: Family Medicine | Admitting: Family Medicine

## 2024-07-07 DIAGNOSIS — N401 Enlarged prostate with lower urinary tract symptoms: Secondary | ICD-10-CM | POA: Diagnosis not present

## 2024-07-07 DIAGNOSIS — N179 Acute kidney failure, unspecified: Secondary | ICD-10-CM | POA: Diagnosis not present

## 2024-07-07 DIAGNOSIS — R4182 Altered mental status, unspecified: Secondary | ICD-10-CM

## 2024-07-07 DIAGNOSIS — N390 Urinary tract infection, site not specified: Principal | ICD-10-CM | POA: Diagnosis present

## 2024-07-07 DIAGNOSIS — R531 Weakness: Secondary | ICD-10-CM

## 2024-07-07 DIAGNOSIS — E1122 Type 2 diabetes mellitus with diabetic chronic kidney disease: Secondary | ICD-10-CM | POA: Diagnosis not present

## 2024-07-07 DIAGNOSIS — R413 Other amnesia: Secondary | ICD-10-CM | POA: Diagnosis not present

## 2024-07-07 DIAGNOSIS — R5383 Other fatigue: Secondary | ICD-10-CM

## 2024-07-07 DIAGNOSIS — G9341 Metabolic encephalopathy: Secondary | ICD-10-CM | POA: Diagnosis not present

## 2024-07-07 DIAGNOSIS — D509 Iron deficiency anemia, unspecified: Secondary | ICD-10-CM | POA: Diagnosis not present

## 2024-07-07 DIAGNOSIS — I5032 Chronic diastolic (congestive) heart failure: Secondary | ICD-10-CM | POA: Diagnosis not present

## 2024-07-07 DIAGNOSIS — R053 Chronic cough: Secondary | ICD-10-CM | POA: Diagnosis not present

## 2024-07-07 DIAGNOSIS — I739 Peripheral vascular disease, unspecified: Secondary | ICD-10-CM | POA: Diagnosis not present

## 2024-07-07 DIAGNOSIS — M10371 Gout due to renal impairment, right ankle and foot: Secondary | ICD-10-CM | POA: Diagnosis not present

## 2024-07-07 DIAGNOSIS — I480 Paroxysmal atrial fibrillation: Secondary | ICD-10-CM | POA: Diagnosis not present

## 2024-07-07 LAB — CBC WITH DIFFERENTIAL/PLATELET
Abs Immature Granulocytes: 0.04 K/uL (ref 0.00–0.07)
Basophils Absolute: 0 K/uL (ref 0.0–0.1)
Basophils Relative: 0 %
Eosinophils Absolute: 0.4 K/uL (ref 0.0–0.5)
Eosinophils Relative: 6 %
HCT: 34.2 % — ABNORMAL LOW (ref 39.0–52.0)
Hemoglobin: 11.1 g/dL — ABNORMAL LOW (ref 13.0–17.0)
Immature Granulocytes: 1 %
Lymphocytes Relative: 11 %
Lymphs Abs: 0.9 K/uL (ref 0.7–4.0)
MCH: 29.4 pg (ref 26.0–34.0)
MCHC: 32.5 g/dL (ref 30.0–36.0)
MCV: 90.5 fL (ref 80.0–100.0)
Monocytes Absolute: 1 K/uL (ref 0.1–1.0)
Monocytes Relative: 13 %
Neutro Abs: 5.4 K/uL (ref 1.7–7.7)
Neutrophils Relative %: 69 %
Platelets: 237 K/uL (ref 150–400)
RBC: 3.78 MIL/uL — ABNORMAL LOW (ref 4.22–5.81)
RDW: 13.6 % (ref 11.5–15.5)
WBC: 7.7 K/uL (ref 4.0–10.5)
nRBC: 0 % (ref 0.0–0.2)

## 2024-07-07 LAB — URINALYSIS, ROUTINE W REFLEX MICROSCOPIC
Bilirubin Urine: NEGATIVE
Glucose, UA: NEGATIVE mg/dL
Hgb urine dipstick: NEGATIVE
Ketones, ur: NEGATIVE mg/dL
Nitrite: NEGATIVE
Protein, ur: 30 mg/dL — AB
Specific Gravity, Urine: 1.01 (ref 1.005–1.030)
WBC, UA: >50 WBC/hpf (ref 0–5)
pH: 5 (ref 5.0–8.0)

## 2024-07-07 LAB — COMPREHENSIVE METABOLIC PANEL WITH GFR
ALT: 62 U/L — ABNORMAL HIGH (ref 0–44)
AST: 84 U/L — ABNORMAL HIGH (ref 15–41)
Albumin: 2.7 g/dL — ABNORMAL LOW (ref 3.5–5.0)
Alkaline Phosphatase: 141 U/L — ABNORMAL HIGH (ref 38–126)
Anion gap: 8 (ref 5–15)
BUN: 77 mg/dL — ABNORMAL HIGH (ref 8–23)
CO2: 22 mmol/L (ref 22–32)
Calcium: 8.6 mg/dL — ABNORMAL LOW (ref 8.9–10.3)
Chloride: 108 mmol/L (ref 98–111)
Creatinine, Ser: 3.18 mg/dL — ABNORMAL HIGH (ref 0.61–1.24)
GFR, Estimated: 18 mL/min — ABNORMAL LOW (ref >60)
Glucose, Bld: 195 mg/dL — ABNORMAL HIGH (ref 70–99)
Potassium: 5.3 mmol/L — ABNORMAL HIGH (ref 3.5–5.1)
Sodium: 138 mmol/L (ref 135–145)
Total Bilirubin: 1.6 mg/dL — ABNORMAL HIGH (ref 0.0–1.2)
Total Protein: 7 g/dL (ref 6.5–8.1)

## 2024-07-07 LAB — RESP PANEL BY RT-PCR (RSV, FLU A&B, COVID)  RVPGX2
Influenza A by PCR: NEGATIVE
Influenza B by PCR: NEGATIVE
Resp Syncytial Virus by PCR: NEGATIVE
SARS Coronavirus 2 by RT PCR: NEGATIVE

## 2024-07-07 LAB — AMMONIA: Ammonia: 23 umol/L (ref 9–35)

## 2024-07-07 MED ORDER — ALBUTEROL SULFATE (2.5 MG/3ML) 0.083% IN NEBU: 2.5000 mg | INHALATION_SOLUTION | RESPIRATORY_TRACT | Status: DC | PRN

## 2024-07-07 MED ORDER — ACETAMINOPHEN 325 MG PO TABS: 650.0000 mg | ORAL_TABLET | Freq: Four times a day (QID) | ORAL | Status: DC | PRN

## 2024-07-07 MED ORDER — ACETAMINOPHEN 500 MG PO TABS
1000.0000 mg | ORAL_TABLET | Freq: Two times a day (BID) | ORAL | Status: DC
  Administered 2024-07-07 – 2024-07-10 (×6): 1000 mg via ORAL
  Filled 2024-07-07 (×6): qty 2

## 2024-07-07 MED ORDER — SODIUM CHLORIDE 0.9 % IV SOLN
2.0000 g | Freq: Once | INTRAVENOUS | Status: AC
  Administered 2024-07-07: 2 g via INTRAVENOUS
  Filled 2024-07-07: qty 20

## 2024-07-07 MED ORDER — APIXABAN 5 MG PO TABS
5.0000 mg | ORAL_TABLET | Freq: Two times a day (BID) | ORAL | Status: DC
  Administered 2024-07-07 – 2024-07-08 (×2): 5 mg via ORAL
  Filled 2024-07-07 (×2): qty 1

## 2024-07-07 MED ORDER — ONDANSETRON HCL 4 MG/2ML IJ SOLN: 4.0000 mg | Freq: Four times a day (QID) | INTRAMUSCULAR | Status: DC | PRN

## 2024-07-07 MED ORDER — SODIUM CHLORIDE 0.9 % IV SOLN: INTRAVENOUS | Status: AC

## 2024-07-07 MED ORDER — LACTATED RINGERS IV BOLUS
1000.0000 mL | Freq: Once | INTRAVENOUS | Status: AC
  Administered 2024-07-07: 1000 mL via INTRAVENOUS

## 2024-07-07 MED ORDER — PANTOPRAZOLE SODIUM 40 MG PO TBEC
40.0000 mg | DELAYED_RELEASE_TABLET | Freq: Two times a day (BID) | ORAL | Status: DC
  Administered 2024-07-07 – 2024-07-10 (×6): 40 mg via ORAL
  Filled 2024-07-07 (×6): qty 1

## 2024-07-07 MED ORDER — ONDANSETRON HCL 4 MG PO TABS: 4.0000 mg | ORAL_TABLET | Freq: Four times a day (QID) | ORAL | Status: DC | PRN

## 2024-07-07 MED ORDER — ACETAMINOPHEN 650 MG RE SUPP: 650.0000 mg | Freq: Four times a day (QID) | RECTAL | Status: DC | PRN

## 2024-07-07 NOTE — ED Triage Notes (Signed)
 PT BIB [facility] from [facility], AMS unable to orient to time, staff state new onset as of this morning but does have cognitive baseline issues unspecified. PT endorses 4/10 back pain. PT Aox3.   EMS VS:  74 Hr, 160/72BP, 95% Spo2 RA, CBG 204

## 2024-07-07 NOTE — Progress Notes (Signed)
 Location: [facility] Service:  SNF (31)  Provider:   Code Status: DNR Goals of Care:     07/07/2024    6:38 PM  Advanced Directives  Does Patient Have a Medical Advance Directive? Yes  Type of Advance Directive Out of facility DNR (pink MOST or yellow form)  Does patient want to make changes to medical advance directive? Yes (ED - Information included in AVS)  Pre-existing out of facility DNR order (yellow form or pink MOST form) Pink Most/Yellow Form available - Physician notified to receive inpatient order     Chief Complaint  Patient presents with   Acute Visit    HPI: Patient is a 89 y.o. male seen today for an acute visit for Lethrgy and weakness  Patient lives in SNF in [place]  Usually patient is interactive and Responsive He was noticed to be more sleepy today Not Responding properly Did not eat his lunch and has been feeling weak His vitals are stable  No fever No Pain no SOB or Cough  His Foley is now out after successful Voiding trial     He does have history h/o Urinary Retention with Hydronephrosis and stent     Mild Cognitive impairment MMSE 27/30 in 03/24 Stays in SNF Uses Wheelchair and needs Stand up lift for transfers   He has Severe PAD with ICA stenosis and Renal Artery Stenosis   S/p THA in 12/23 H/o CHF   Past Medical History:  Diagnosis Date   AAA (abdominal aortic aneurysm)    asymptomatic   AAA (abdominal aortic aneurysm) 2010   peripheral  angiogram-- bilateral SFA DISEASE  and 60 to 70% infrarenaal abd. aortic stenosis with 15 -mm gradient   Adenomatous colon polyp 11/2003   CAD (coronary artery disease)    Gait abnormality 02/13/2017   Gastroparesis    pt denies   GERD (gastroesophageal reflux disease)    w/ LPR   History of kidney stones    x2   Hyperlipidemia    Hypertension    IDA (iron  deficiency anemia)    Peripheral arterial disease    RCEA  by Dr Medford Brunswick   PUD (peptic ulcer  disease)    pt unaware   Vertigo     Past Surgical History:  Procedure Laterality Date   ABDOMINAL AORTOGRAM N/A 11/19/2023   Procedure: ABDOMINAL AORTOGRAM;  Surgeon: Sheree Penne Bruckner, MD;  Location: [facility] INVASIVE CV LAB;  Service: Cardiovascular;  Laterality: N/A;   ABDOMINAL AORTOGRAM W/LOWER EXTREMITY Bilateral 08/05/2018   Procedure: ABDOMINAL AORTOGRAM W/LOWER EXTREMITY;  Surgeon: Court Dorn PARAS, MD;  Location: [facility] INVASIVE CV LAB;  Service: Cardiovascular;  Laterality: Bilateral;   ABDOMINAL AORTOGRAM W/LOWER EXTREMITY N/A 09/26/2021   Procedure: ABDOMINAL AORTOGRAM W/LOWER EXTREMITY;  Surgeon: Court Dorn PARAS, MD;  Location: [facility] INVASIVE CV LAB;  Service: Cardiovascular;  Laterality: N/A;   AORTOGRAM  04/24/2016    Abdominal aortogram, bilateral iliac angiogram, bifemoral runoff   CARDIAC CATHETERIZATION  05/12/2005   RCA   carotid doppler  01/24/2013   RICA endarterectomy,left CCA 0-49%; left bulb and prox ICA 50-69%; bilaateral subclavian < 50%   CAROTID ENDARTERECTOMY  11/01/2010   CATARACT EXTRACTION     COLONOSCOPY     CORONARY ANGIOPLASTY  05/18/2005   2 STENTS distal RCA AND PROXIMAL-MID RCA   5 total stents per pt.   CYSTOSCOPY/URETEROSCOPY/HOLMIUM LASER/STENT PLACEMENT Left 01/11/2018   Procedure: LEFT URETEROSCOPY/HOLMIUM LASER/STENT PLACEMENT;  Surgeon: Cam Morene ORN, MD;  Location: [facility] ORS;  Service: Urology;  Laterality: Left;   DOPPLER ECHOCARDIOGRAPHY  02/07/2012   EF 55%,SHOWED NO ISCHEMIA    HERNIA REPAIR     umbilical   lower arterial  doppler  02/04/2013   aotra 1.5 x 1.5 cm; distal abd aorta 70-99%,proximal common iliac arteries -very stenotic with increased velocities>50%,may be falsely elevated as a result of residual plaque from the distal aorta stenosis   LOWER EXTREMITY ANGIOGRAPHY N/A 11/19/2023   Procedure: Lower Extremity Angiography;  Surgeon: Sheree Penne Bruckner, MD;  Location: [facility] INVASIVE CV LAB;  Service: Cardiovascular;  Laterality:  N/A;   lower extremity doppler  June 18 ,2013   ABI'S ABNORMAL, RABI was 0.88 and LABI 0.75 ,with 3-vessel  run off   NM MYOVIEW  LTD  MAY 23,2011   showed no significant ischemia;   NM MYOVIEW  LTD  04/22/2008   ef 77%,exercise capcity 6 METS ,exaggerated blood pressure response to exercise   PERIPHERAL VASCULAR CATHETERIZATION N/A 04/24/2016   Procedure: Lower Extremity Angiography;  Surgeon: Dorn JINNY Lesches, MD;  Location: [facility] INVASIVE CV LAB;  Service: Cardiovascular;  Laterality: N/A;   PERIPHERAL VASCULAR CATHETERIZATION  04/24/2016   Procedure: Peripheral Vascular Intervention;  Surgeon: Dorn JINNY Lesches, MD;  [facility] INVASIVE CV LAB;  Service: Cardiovascular;;  Aorta   retrograde central aortic catheterization  05/19/2005   cutting balloon atherectomy, c-circ stenosis with DES STENTING CYPHER   TONSILLECTOMY     TOTAL HIP ARTHROPLASTY Right 07/11/2022   Procedure: TOTAL HIP ARTHROPLASTY ANTERIOR APPROACH;  Surgeon: Ernie Cough, MD;  Location: [facility] ORS;  Service: Orthopedics;  Laterality: Right;   TOTAL KNEE ARTHROPLASTY Left 01/03/2019   Procedure: TOTAL KNEE ARTHROPLASTY;  Surgeon: Kay Kemps, MD;  Location: [facility] OR[facility];  Service: Orthopedics;  Laterality: Left;  with IS block   TRANSURETHRAL RESECTION OF PROSTATE N/A 09/08/2016   Procedure: TRANSURETHRAL RESECTION OF THE PROSTATE (TURP);  Surgeon: Morene LELON Salines, MD;  Location: [facility] OR[facility];  Service: Urology;  Laterality: N/A;   TRANSURETHRAL RESECTION OF PROSTATE     10-10-17  Dr. Salines   TRANSURETHRAL RESECTION OF PROSTATE N/A 10/10/2017   Procedure: TRANSURETHRAL RESECTION OF THE PROSTATE (TURP);  Surgeon: Salines Morene LELON, MD;  Location: [facility] ORS;  Service: Urology;  Laterality: N/A;   VASECTOMY      Allergies  Allergen Reactions   Lisinopril Cough   Niaspan [Niacin Er (Antihyperlipidemic)] Rash    Outpatient Encounter Medications as of 07/07/2024  Medication Sig   acetaminophen  (TYLENOL ) 500 MG tablet Take 1,000 mg by mouth  2 (two) times daily.   allopurinol  (ZYLOPRIM ) 100 MG tablet Take 1 tablet (100 mg total) by mouth daily.   amLODipine  (NORVASC ) 10 MG tablet Take 10 mg by mouth in the morning. Hold if BP <95   apixaban  (ELIQUIS ) 5 MG TABS tablet Take 1 tablet (5 mg total) by mouth 2 (two) times daily.   Apoaequorin (PREVAGEN PO) Take 1 tablet by mouth in the morning.   atorvastatin  (LIPITOR) 40 MG tablet Take 40 mg by mouth daily at 6 PM.    Biotin  10 MG TABS Take 10 mg by mouth in the morning. Once a morning 8am-11am   cetirizine  (ZYRTEC ) 10 MG tablet Take 1 tablet (10 mg total) by mouth at bedtime.   cholecalciferol  (VITAMIN D3) 25 MCG (1000 UT) tablet Take 1,000 Units by mouth in the morning.   cloNIDine  (CATAPRES ) 0.1 MG tablet Take 1 tablet (0.1 mg total) by mouth 3 (three) times daily. Hold if  BP < 95   finasteride  (PROSCAR ) 5 MG tablet Take 5 mg by mouth in the morning.   furosemide  (LASIX ) 20 MG tablet Take 20 mg by mouth daily as needed (HOLD UNTIL 01/28/2024).   furosemide  (LASIX ) 40 MG tablet Take 1 tablet (40 mg total) by mouth daily. HOLD UNTIL 01/28/2024   gabapentin  (NEURONTIN ) 100 MG capsule Take 200 mg by mouth 3 (three) times daily.   Glucosamine-Chondroitin 750-600 MG TABS Take 1 tablet by mouth 2 (two) times daily.   hydrALAZINE  (APRESOLINE ) 50 MG tablet Take 50 mg by mouth 4 (four) times daily. Hold if BP <95   ipratropium-albuterol  (DUONEB) 0.5-2.5 (3) MG/3ML SOLN Take 3 mLs by nebulization every 6 (six) hours as needed for up to 7 days.   iron  polysaccharides (NIFEREX) 150 MG capsule Take 150 mg by mouth every Monday, Wednesday, and Friday.   melatonin 5 MG TABS Take 1 tablet (5 mg total) by mouth at bedtime.   metoprolol  tartrate (LOPRESSOR ) 50 MG tablet Take 75 mg by mouth 2 (two) times daily.   mineral oil-hydrophilic petrolatum (AQUAPHOR) ointment Apply topically at bedtime.   Multiple Vitamin (MULTIVITAMIN WITH MINERALS) TABS tablet Take 1 tablet by mouth in the morning.    pantoprazole  (PROTONIX ) 40 MG tablet Take 40 mg by mouth 2 (two) times daily.   polyethylene glycol (MIRALAX ) 17 g packet Take 17 g by mouth daily.   potassium chloride  SA (K-DUR,KLOR-CON ) 20 MEQ tablet Take 20 mEq by mouth every evening.    sennosides-docusate sodium  (SENOKOT-S) 8.6-50 MG tablet Take 2 tablets by mouth at bedtime.   tamsulosin  (FLOMAX ) 0.4 MG CAPS capsule Take 1 capsule (0.4 mg total) by mouth daily after supper.   valsartan  (DIOVAN ) 80 MG tablet Take 1.5 tablets (120 mg total) by mouth daily.  Give 120 mg by mouth in the morning for Hypertension Valsartan  120 mg QD   No facility-administered encounter medications on file as of 07/07/2024.    Review of Systems:  Review of Systems  Constitutional:  Positive for activity change and appetite change. Negative for unexpected weight change.  HENT: Negative.    Respiratory:  Negative for cough and shortness of breath.   Cardiovascular:  Negative for leg swelling.  Gastrointestinal:  Negative for constipation.  Genitourinary:  Negative for frequency.  Musculoskeletal:  Negative for arthralgias, gait problem and myalgias.  Skin: Negative.  Negative for rash.  Neurological:  Positive for weakness. Negative for dizziness.  Psychiatric/Behavioral:  Positive for confusion. Negative for sleep disturbance.   All other systems reviewed and are negative.   Health Maintenance  Topic Date Due   Influenza Vaccine  02/29/2024   COVID-19 Vaccine (7 - 2025-26 season) 03/31/2024   HEMOGLOBIN A1C  08/13/2024   OPHTHALMOLOGY EXAM  08/26/2024   Medicare Annual Wellness (AWV)  01/10/2025   Pneumococcal Vaccine: 50+ Years (3 of 3 - PCV) 01/11/2025   FOOT EXAM  02/27/2025   DTaP/Tdap/Td (3 - Tdap) 01/24/2034   Zoster Vaccines- Shingrix  Completed   Meningococcal B Vaccine  Aged Out    Physical Exam: Vitals:   07/07/24 1457  BP: (!) 152/61  Pulse: 71  Resp: 20  Temp: (!) 97.1 F (36.2 C)  Weight: 184 lb (83.5 kg)   Body mass index  is 31.58 kg/m. Physical Exam Vitals reviewed.  Constitutional:      Comments: Patient would respond but could not keep his eyes open and then fall asleep  HENT:     Head: Normocephalic.  Nose: Nose normal.     Mouth/Throat:     Mouth: Mucous membranes are moist.     Pharynx: Oropharynx is clear.  Eyes:     Pupils: Pupils are equal, round, and reactive to light.  Cardiovascular:     Rate and Rhythm: Normal rate and regular rhythm.     Pulses: Normal pulses.     Heart sounds: Murmur heard.  Pulmonary:     Effort: Pulmonary effort is normal. No respiratory distress.     Breath sounds: Normal breath sounds. No rales.  Abdominal:     General: Abdomen is flat. Bowel sounds are normal.     Palpations: Abdomen is soft.  Musculoskeletal:        General: No swelling.     Cervical back: Neck supple.  Skin:    General: Skin is warm.  Neurological:     Comments: Lethargic  Psychiatric:        Mood and Affect: Mood normal.        Thought Content: Thought content normal.     Labs reviewed: Basic Metabolic Panel: Recent Labs    09/28/23 0236 09/29/23 0752 09/30/23 0231 10/01/23 0234 11/19/23 1128 12/18/23 0000 01/22/24 0000 01/28/24 0000 02/04/24 0000  NA 142   < > 142 140 144   < > 141 140 143  K 3.7   < > 3.4* 3.6 4.7   < > 3.9 4.3 4.2  CL 106   < > 107 106 108   < > 104 105 104  CO2 26   < > 24 24  --    < > 26* 21 23*  GLUCOSE 120*   < > 111* 102* 136*  --   --   --   --   BUN 49*   < > 37* 32* 29*   < > 63* 58* 40*  CREATININE 1.99*   < > 1.37* 1.16 1.20   < > 2.3* 1.8* 1.3  CALCIUM  8.7*   < > 8.8* 8.8*  --    < > 9.1 9.5 9.3  MG 2.1  --   --  2.2  --   --   --   --   --    < > = values in this interval not displayed.   Liver Function Tests: Recent Labs    01/22/24 0000  AST 14  ALT 16  ALKPHOS 96  ALBUMIN 3.9   No results for input(s): LIPASE, AMYLASE in the last 8760 hours. No results for input(s): AMMONIA in the last 8760 hours. CBC: Recent  Labs    09/25/23 2212 09/27/23 0228 09/29/23 0752 09/30/23 0231 11/19/23 1128 01/22/24 0000  WBC 7.4 9.3 9.4 7.1  --  9.1  NEUTROABS 5.6  --   --   --   --   --   HGB 11.7* 10.3* 11.1* 10.0* 12.2* 12.4*  HCT 36.7* 31.0* 33.3* 30.9* 36.0* 36*  MCV 91.5 88.3 88.6 90.4  --   --   PLT 181 187 185 184  --  211   Lipid Panel: Recent Labs    02/11/24 0000  CHOL 134  HDL 38  LDLCALC 59  TRIG 185*   Lab Results  Component Value Date   HGBA1C 7.9 02/11/2024    Procedures since last visit: No results found.  Assessment/Plan 1. Lethargy (Primary) Rule out Infection or metabolic Encephalopathy Stat CBC, and CMP  Also Check UA  Nurse called to say that his In and Out urine  showed Green mucus and Cloudy Labs cannot be done due to bad weather Will send him to ED   Labs/tests ordered:  * No order type specified * Next appt:  Visit date not found

## 2024-07-07 NOTE — H&P (Signed)
 History and Physical    Nathan Santiago [facility] FMW:[id number] DOB: [birth year] DOA: 07/07/2024  PCP: Charlanne Fredia CROME, MD  Patient coming from: ALF  I have personally briefly reviewed patient's old medical records in [facility]  Chief Complaint: weakness and lethargy   HPI: Nathan Santiago is a 89 y.o. male with medical history significant of  CAD, AAA, PAF, hypertension, hyperlipidemia, carotid artery disease and PAD who was seen initially to [facility], day of admission for sick visit due to increase weakness and lethargy. ON evaluation patient urine was note to be cloudy with green mucus due to patient symptoms and high concern for UTI , patient was referred to the ED. Patient is currently alert to self, know he is in a hospital. He no no current complaints, denies n/v/d/ sob /cp/dysuria or abdominal pain    ED Course:  Vitals: Afeb  BP 152/61, hr 71 rr 20  Wbc 7.7, hgb 11.1, plt 237,  Na 138, K 5.3, Cl 108, bicarg 22, Glu 195, cr 3.18 (1.3) AST 84, Aslt 52, Alphos 141, tbili 1.6  Cxr:  NAD CTH: NAD RVP:neg Ammonia-23 UA turbid, LE large, bacteria Many   CTAB IMPRESSION: 1. No acute findings in the abdomen or pelvis with limited evaluation on this noncontrast study. 2. Severe atherosclerotic plaque of the aorta and its main branches. 3. Sequelae of prior granulomatous disease. 4. Other, non-acute and/or normal findings as above.  Review of Systems: As per HPI otherwise 10 point review of systems negative.   Past Medical History:  Diagnosis Date   AAA (abdominal aortic aneurysm)    asymptomatic   AAA (abdominal aortic aneurysm) 2010   peripheral  angiogram-- bilateral SFA DISEASE  and 60 to 70% infrarenaal abd. aortic stenosis with 15 -mm gradient   Adenomatous colon polyp 11/2003   CAD (coronary artery disease)    Gait abnormality 02/13/2017   Gastroparesis    pt denies   GERD (gastroesophageal reflux disease)    w/ LPR   History of kidney  stones    x2   Hyperlipidemia    Hypertension    IDA (iron  deficiency anemia)    Peripheral arterial disease    RCEA  by Dr Medford Brunswick   PUD (peptic ulcer disease)    pt unaware   Vertigo     Past Surgical History:  Procedure Laterality Date   ABDOMINAL AORTOGRAM N/A 11/19/2023   Procedure: ABDOMINAL AORTOGRAM;  Surgeon: Sheree Penne Bruckner, MD;  Location: [facility] INVASIVE CV LAB;  Service: Cardiovascular;  Laterality: N/A;   ABDOMINAL AORTOGRAM W/LOWER EXTREMITY Bilateral 08/05/2018   Procedure: ABDOMINAL AORTOGRAM W/LOWER EXTREMITY;  Surgeon: Court Dorn PARAS, MD;  Location: [facility] INVASIVE CV LAB;  Service: Cardiovascular;  Laterality: Bilateral;   ABDOMINAL AORTOGRAM W/LOWER EXTREMITY N/A 09/26/2021   Procedure: ABDOMINAL AORTOGRAM W/LOWER EXTREMITY;  Surgeon: Court Dorn PARAS, MD;  Location: [facility] INVASIVE CV LAB;  Service: Cardiovascular;  Laterality: N/A;   AORTOGRAM  04/24/2016    Abdominal aortogram, bilateral iliac angiogram, bifemoral runoff   CARDIAC CATHETERIZATION  05/12/2005   RCA   carotid doppler  01/24/2013   RICA endarterectomy,left CCA 0-49%; left bulb and prox ICA 50-69%; bilaateral subclavian < 50%   CAROTID ENDARTERECTOMY  11/01/2010   CATARACT EXTRACTION     COLONOSCOPY     CORONARY ANGIOPLASTY  05/18/2005   2 STENTS distal RCA AND PROXIMAL-MID RCA   5 total stents per pt.   CYSTOSCOPY/URETEROSCOPY/HOLMIUM LASER/STENT PLACEMENT Left 01/11/2018  Procedure: LEFT URETEROSCOPY/HOLMIUM LASER/STENT PLACEMENT;  Surgeon: Cam Morene ORN, MD;  Location: [facility] ORS;  Service: Urology;  Laterality: Left;   DOPPLER ECHOCARDIOGRAPHY  02/07/2012   EF 55%,SHOWED NO ISCHEMIA    HERNIA REPAIR     umbilical   lower arterial  doppler  02/04/2013   aotra 1.5 x 1.5 cm; distal abd aorta 70-99%,proximal common iliac arteries -very stenotic with increased velocities>50%,may be falsely elevated as a result of residual plaque from the distal aorta stenosis   LOWER EXTREMITY ANGIOGRAPHY  N/A 11/19/2023   Procedure: Lower Extremity Angiography;  Surgeon: Sheree Penne Bruckner, MD;  Location: [facility] INVASIVE CV LAB;  Service: Cardiovascular;  Laterality: N/A;   lower extremity doppler  June 18 ,2013   ABI'S ABNORMAL, RABI was 0.88 and LABI 0.75 ,with 3-vessel  run off   NM MYOVIEW  LTD  MAY 23,2011   showed no significant ischemia;   NM MYOVIEW  LTD  04/22/2008   ef 77%,exercise capcity 6 METS ,exaggerated blood pressure response to exercise   PERIPHERAL VASCULAR CATHETERIZATION N/A 04/24/2016   Procedure: Lower Extremity Angiography;  Surgeon: Dorn JINNY Lesches, MD;  Location: [facility] INVASIVE CV LAB;  Service: Cardiovascular;  Laterality: N/A;   PERIPHERAL VASCULAR CATHETERIZATION  04/24/2016   Procedure: Peripheral Vascular Intervention;  Surgeon: Dorn JINNY Lesches, MD;  Location: [facility] INVASIVE CV LAB;  Service: Cardiovascular;;  Aorta   retrograde central aortic catheterization  05/19/2005   cutting balloon atherectomy, c-circ stenosis with DES STENTING CYPHER   TONSILLECTOMY     TOTAL HIP ARTHROPLASTY Right 07/11/2022   Procedure: TOTAL HIP ARTHROPLASTY ANTERIOR APPROACH;  Surgeon: Ernie Cough, MD;  Location: [facility] ORS;  Service: Orthopedics;  Laterality: Right;   TOTAL KNEE ARTHROPLASTY Left 01/03/2019   Procedure: TOTAL KNEE ARTHROPLASTY;  Surgeon: Kay Kemps, MD;  Location: [facility] OR[facility];  Service: Orthopedics;  Laterality: Left;  with IS block   TRANSURETHRAL RESECTION OF PROSTATE N/A 09/08/2016   Procedure: TRANSURETHRAL RESECTION OF THE PROSTATE (TURP);  Surgeon: Morene ORN Cam, MD;  Location: [facility] OR[facility];  Service: Urology;  Laterality: N/A;   TRANSURETHRAL RESECTION OF PROSTATE     10-10-17  Dr. Cam   TRANSURETHRAL RESECTION OF PROSTATE N/A 10/10/2017   Procedure: TRANSURETHRAL RESECTION OF THE PROSTATE (TURP);  Surgeon: Cam Morene ORN, MD;  Location: [facility] ORS;  Service: Urology;  Laterality: N/A;   VASECTOMY       reports that he quit smoking about 55 years ago. His smoking use  included cigarettes. He started smoking about 75 years ago. He has a 40 pack-year smoking history. He has never used smokeless tobacco. He reports that he does not currently use alcohol  after a past usage of about 4.0 standard drinks of alcohol  per week. He reports that he does not use drugs.  Allergies  Allergen Reactions   Lisinopril Cough   Niaspan [Niacin Er (Antihyperlipidemic)] Rash    Family History  Problem Relation Age of Onset   Ovarian cancer Sister    Heart disease Father    Brain cancer Brother        Tumor    Prior to Admission medications   Medication Sig Start Date End Date Taking? Authorizing Provider  acetaminophen  (TYLENOL ) 500 MG tablet Take 1,000 mg by mouth 2 (two) times daily.    [provider]  allopurinol  (ZYLOPRIM ) 100 MG tablet Take 1 tablet (100 mg total) by mouth daily. 05/26/24   Charlanne Fredia CROME, MD  amLODipine  (NORVASC ) 10 MG tablet Take 10 mg by mouth in  the morning. Hold if BP <95 06/07/22   [provider]  apixaban  (ELIQUIS ) 5 MG TABS tablet Take 1 tablet (5 mg total) by mouth 2 (two) times daily. 10/01/23   Bryn Bernardino NOVAK, MD  Apoaequorin (PREVAGEN PO) Take 1 tablet by mouth in the morning.    [provider]  atorvastatin  (LIPITOR) 40 MG tablet Take 40 mg by mouth daily at 6 PM.  11/14/16   [provider]  Biotin  10 MG TABS Take 10 mg by mouth in the morning. Once a morning 8am-11am    [provider]  cetirizine  (ZYRTEC ) 10 MG tablet Take 1 tablet (10 mg total) by mouth at bedtime. 11/16/22   Darlean Maus, NP  cholecalciferol  (VITAMIN D3) 25 MCG (1000 UT) tablet Take 1,000 Units by mouth in the morning.    [provider]  cloNIDine  (CATAPRES ) 0.1 MG tablet Take 1 tablet (0.1 mg total) by mouth 3 (three) times daily. Hold if BP < 95 12/12/23   Fargo, Amy E, NP  finasteride  (PROSCAR ) 5 MG tablet Take 5 mg by mouth in the morning.    [provider]  furosemide  (LASIX ) 20 MG tablet Take 20 mg  by mouth daily as needed (HOLD UNTIL 01/28/2024).    [provider]  furosemide  (LASIX ) 40 MG tablet Take 1 tablet (40 mg total) by mouth daily. HOLD UNTIL 01/28/2024 01/23/24   Fargo, Amy E, NP  gabapentin  (NEURONTIN ) 100 MG capsule Take 200 mg by mouth 3 (three) times daily.    [provider]  Glucosamine-Chondroitin 750-600 MG TABS Take 1 tablet by mouth 2 (two) times daily.    [provider]  hydrALAZINE  (APRESOLINE ) 50 MG tablet Take 50 mg by mouth 4 (four) times daily. Hold if BP <95    [provider]  ipratropium-albuterol  (DUONEB) 0.5-2.5 (3) MG/3ML SOLN Take 3 mLs by nebulization every 6 (six) hours as needed for up to 7 days. 06/25/24 07/02/24  Darlean Maus, NP  iron  polysaccharides (NIFEREX) 150 MG capsule Take 150 mg by mouth every Monday, Wednesday, and Friday.    [provider]  melatonin 5 MG TABS Take 1 tablet (5 mg total) by mouth at bedtime. 10/13/22   Darlean Maus, NP  metoprolol  tartrate (LOPRESSOR ) 50 MG tablet Take 75 mg by mouth 2 (two) times daily.    [provider]  mineral oil-hydrophilic petrolatum (AQUAPHOR) ointment Apply topically at bedtime. 03/18/24   Darlean Maus, NP  Multiple Vitamin (MULTIVITAMIN WITH MINERALS) TABS tablet Take 1 tablet by mouth in the morning.    [provider]  pantoprazole  (PROTONIX ) 40 MG tablet Take 40 mg by mouth 2 (two) times daily.    [provider]  polyethylene glycol (MIRALAX ) 17 g packet Take 17 g by mouth daily. 02/28/24   Wert, Christina, NP  potassium chloride  SA (K-DUR,KLOR-CON ) 20 MEQ tablet Take 20 mEq by mouth every evening.  07/16/13   [provider]  sennosides-docusate sodium  (SENOKOT-S) 8.6-50 MG tablet Take 2 tablets by mouth at bedtime. 02/28/24   Darlean Maus, NP  tamsulosin  (FLOMAX ) 0.4 MG CAPS capsule Take 1 capsule (0.4 mg total) by mouth daily after supper. 05/15/22   Ezenduka, Nkeiruka J, MD  valsartan  (DIOVAN ) 80 MG tablet  Take 1.5 tablets (120 mg total) by mouth daily.  Give 120 mg by mouth in the morning for Hypertension Valsartan  120 mg QD 03/18/24 03/18/25  Darlean Maus, NP    Physical Exam: Vitals:   07/07/24 1837 07/07/24 1945  07/07/24 2003 07/07/24 2156  BP:   (!) 149/66   Pulse:  62 73 70  Resp:   15 13  Temp:   97.9 F (36.6 C) (!) 97.4 F (36.3 C)  TempSrc:   Oral Oral  SpO2:  94% 94% 96%  Weight: 81.6 kg     Height: 5' 4 (1.626 m)       Constitutional: NAD, calm, comfortable Vitals:   07/07/24 1837 07/07/24 1945 07/07/24 2003 07/07/24 2156  BP:   (!) 149/66   Pulse:  62 73 70  Resp:   15 13  Temp:   97.9 F (36.6 C) (!) 97.4 F (36.3 C)  TempSrc:   Oral Oral  SpO2:  94% 94% 96%  Weight: 81.6 kg     Height: 5' 4 (1.626 m)      Eyes: PERRL, lids and conjunctivae normal ENMT: Mucous membranes are moist. Posterior pharynx clear of any exudate or lesions.Normal dentition.  Neck: normal, supple, no masses, no thyromegaly Respiratory: clear to auscultation bilaterally, no wheezing, no crackles. Normal respiratory effort. No accessory muscle use.  Cardiovascular: Regular rate and rhythm, no murmurs / rubs / gallops. No extremity edema. 2+ pedal pulses. No carotid bruits.  Abdomen: no tenderness, no masses palpated. No hepatosplenomegaly. Bowel sounds positive.  Musculoskeletal: no clubbing / cyanosis. No joint deformity upper and lower extremities. Good ROM, no contractures. Normal muscle tone.  Skin: no rashes, lesions, ulcers. No induration Neurologic: CN 2-12 grossly intact. Sensation intact, DTR normal. Strength 5/5 in all 4.  Psychiatric: Normal judgment and insight. Alert and oriented x 3. Normal mood.    Labs on Admission: I have personally reviewed following labs and imaging studies  CBC: Recent Labs  Lab 07/07/24 1836  WBC 7.7  NEUTROABS 5.4  HGB 11.1*  HCT 34.2*  MCV 90.5  PLT 237   Basic Metabolic Panel: Recent Labs  Lab 07/07/24 1836  NA 138  K 5.3*  CL  108  CO2 22  GLUCOSE 195*  BUN 77*  CREATININE 3.18*  CALCIUM  8.6*   GFR: Estimated Creatinine Clearance: 14.6 mL/min (A) (by C-G formula based on SCr of 3.18 mg/dL (H)). Liver Function Tests: Recent Labs  Lab 07/07/24 1836  AST 84*  ALT 62*  ALKPHOS 141*  BILITOT 1.6*  PROT 7.0  ALBUMIN 2.7*   No results for input(s): LIPASE, AMYLASE in the last 168 hours. Recent Labs  Lab 07/07/24 2006  AMMONIA 23   Coagulation Profile: No results for input(s): INR, PROTIME in the last 168 hours. Cardiac Enzymes: No results for input(s): CKTOTAL, CKMB, CKMBINDEX, TROPONINI in the last 168 hours. BNP (last 3 results) No results for input(s): PROBNP in the last 8760 hours. HbA1C: No results for input(s): HGBA1C in the last 72 hours. CBG: No results for input(s): GLUCAP in the last 168 hours. Lipid Profile: No results for input(s): CHOL, HDL, LDLCALC, TRIG, CHOLHDL, LDLDIRECT in the last 72 hours. Thyroid  Function Tests: No results for input(s): TSH, T4TOTAL, FREET4, T3FREE, THYROIDAB in the last 72 hours. Anemia Panel: No results for input(s): VITAMINB12, FOLATE, FERRITIN, TIBC, IRON , RETICCTPCT in the last 72 hours. Urine analysis:    Component Value Date/Time   COLORURINE YELLOW 07/07/2024 2041   APPEARANCEUR TURBID (A) 07/07/2024 2041   LABSPEC 1.010 07/07/2024 2041   PHURINE 5.0 07/07/2024 2041   GLUCOSEU NEGATIVE 07/07/2024 2041   HGBUR NEGATIVE 07/07/2024 2041   BILIRUBINUR NEGATIVE 07/07/2024 2041   KETONESUR NEGATIVE 07/07/2024 2041   PROTEINUR 30 (A)  07/07/2024 2041   UROBILINOGEN 0.2 10/28/2010 0909   NITRITE NEGATIVE 07/07/2024 2041   LEUKOCYTESUR LARGE (A) 07/07/2024 2041    Radiological Exams on Admission: CT ABDOMEN PELVIS WO CONTRAST Result Date: 07/07/2024 EXAM: CT ABDOMEN AND PELVIS WITHOUT CONTRAST 07/07/2024 08:18:20 PM TECHNIQUE: CT of the abdomen and pelvis was performed without the administration  of intravenous contrast. Multiplanar reformatted images are provided for review. Automated exposure control, iterative reconstruction, and/or weight-based adjustment of the mA/kV was utilized to reduce the radiation dose to as low as reasonably achievable. COMPARISON: None available. CLINICAL HISTORY: Abdominal pain, acute, nonlocalized; AKI, poor historian, eval for obstruction. FINDINGS: LOWER CHEST: Calcified bibasilar granulomas with calcified lymph node noted on the right interlobular region. Aortic valve leaflet calcification, mitral annular calcification, coronary artery calcification. LIVER: A fluid density lesion within the liver likely represents a simple hepatic cyst versus hemangioma. Subcentimeter hypodensities within the left hepatic lobe are too small to characterize (3.16). The liver is otherwise unremarkable. GALLBLADDER AND BILE DUCTS: Gallbladder is unremarkable. No biliary ductal dilatation. SPLEEN: Punctate calcifications throughout the spleen are consistent with the sequelae of prior granulomatous disease, as seen within the lungs. PANCREAS: Diffusely atrophic pancreas. No focal lesion. Otherwise normal pancreatic contour. No surrounding inflammatory changes. No main pancreatic ductal dilatation. ADRENAL GLANDS: No acute abnormality. KIDNEYS, URETERS AND BLADDER: Atrophic left kidney with renal cortical scarring. Fluid density lesion within the left kidney likely represents a simple renal cyst. Simple renal cysts do not require additional follow-up unless clinically indicated due to signs/symptoms. No stones in the kidneys or ureters. No hydronephrosis. No perinephric or periureteral stranding. Urinary bladder is unremarkable. GI AND BOWEL: Stomach demonstrates no acute abnormality. No small or large bowel thickening or dilatation. The appendix is unremarkable. Colonic diverticulosis. PERITONEUM AND RETROPERITONEUM: No ascites. No free air. VASCULATURE: Severe atherosclerotic plaque of the  aorta and its main branches. LYMPH NODES: No lymphadenopathy. REPRODUCTIVE ORGANS: No acute abnormality. BONES AND SOFT TISSUES: Total right hip arthroplasty partially visualized. Multilevel mild-to-moderate degenerative change of the spine. Chronic L1 compression fracture. Density sclerotic lesion of the T12 level, likely a bone island. No focal soft tissue abnormality. IMPRESSION: 1. No acute findings in the abdomen or pelvis with limited evaluation on this noncontrast study. 2. Severe atherosclerotic plaque of the aorta and its main branches. 3. Sequelae of prior granulomatous disease. 4. Other, non-acute and/or normal findings as above. Electronically signed by: Morgane Naveau MD 07/07/2024 08:29 PM EST RP Workstation: [id number]   CT Head Wo Contrast Result Date: 07/07/2024 EXAM: CT HEAD WITHOUT CONTRAST 07/07/2024 07:08:00 PM TECHNIQUE: CT of the head was performed without the administration of intravenous contrast. Automated exposure control, iterative reconstruction, and/or weight based adjustment of the mA/kV was utilized to reduce the radiation dose to as low as reasonably achievable. COMPARISON: CT head 03/01/2023 CLINICAL HISTORY: Mental status change, unknown cause FINDINGS: BRAIN AND VENTRICLES: No acute hemorrhage. No evidence of acute infarct. No hydrocephalus. No extra-axial collection. No mass effect or midline shift. Patchy white matter hypoattenuations, compatible with chronic and vascular ischemic change. ORBITS: No acute abnormality. SINUSES: No acute abnormality. SOFT TISSUES AND SKULL: No acute soft tissue abnormality. No skull fracture. IMPRESSION: 1. No acute intracranial abnormality. Electronically signed by: Gilmore Molt MD 07/07/2024 07:16 PM EST RP Workstation: [id number]   DG Chest Portable 1 View Result Date: 07/07/2024 CLINICAL DATA:  Altered level of consciousness EXAM: PORTABLE CHEST 1 VIEW COMPARISON:  09/28/2023 FINDINGS: Single frontal view of the chest demonstrates an  unremarkable cardiac silhouette.  No acute airspace disease, effusion, or pneumothorax. No acute bony abnormalities. IMPRESSION: 1. No acute intrathoracic process. Electronically Signed   By: Ozell Daring M.D.   On: 07/07/2024 19:05    EKG: Independently reviewed.   Assessment/Plan UTI  -admit to tele  - continue CTX  -f/u on urine culture    Acute Metabolic Encephalopathy  -due to above  -tx underlying cause  -CTH negative , no new focal neurologic change    AKI on CKDIIIb  - hold nephrotoxic medications  - gentle ivfs  -strict I/o  -CTAB neg  CAD s/p stent  -on eliquis  so of ASA -continue statin    AAA, -followed by cardiology   PAF -stable  -continue Eliquis    Hypertension -amlodipine ,clonidine  -hold lasix  /valsartan  in setting of aki  Hyperlipidemia -continue statin   Carotid artery disease PAD -follows with Dr Sheree  -patient /family opted for conservative treatment of right lower extremity PAD  Gout -continue allopurinol     DVT prophylaxis: eliquis  Code Status: DNR/as documented by PCP and noted on chart  per patient/family  wishes in event of cardiac arrest  Family Communication: none at bedside Disposition Plan: patient  expected to be admitted greater than 2 midnights  Consults called: n/a Admission status: med tele   Camila DELENA Ned MD [facility]   If 7PM-7AM, please contact night-coverage [email or web address] Password [facility]  07/07/2024, 11:39 PM

## 2024-07-07 NOTE — ED Provider Notes (Signed)
 [place] EMERGENCY DEPARTMENT AT [facility] Provider Note   CSN: [id number] Arrival date & time: 07/07/24  8177     Patient presents with: Altered Mental Status   Nathan Santiago is a 89 y.o. male.   89 year old male with past medical history of hypertension and hyperlipidemia presenting to the emergency department today from assisted living for altered mental status/confusion.  This been going on for the past few days.  The patient reports that he is currently having some flank pain.  Initially was complaining of back pain but on palpation seems to be more flank pain.  He is somewhat confused at this time does not provide much more history.  Medics report that the patient has had decreased oral intake over the past few days and has been having some malodorous cloudy urine per the staff who went into check on him.  He was brought to the ER today for further evaluation.   Altered Mental Status      Prior to Admission medications   Medication Sig Start Date End Date Taking? Authorizing Provider  acetaminophen  (TYLENOL ) 500 MG tablet Take 1,000 mg by mouth 2 (two) times daily.    [provider]  allopurinol  (ZYLOPRIM ) 100 MG tablet Take 1 tablet (100 mg total) by mouth daily. 05/26/24   Charlanne Fredia CROME, MD  amLODipine  (NORVASC ) 10 MG tablet Take 10 mg by mouth in the morning. Hold if BP <95 06/07/22   [provider]  apixaban  (ELIQUIS ) 5 MG TABS tablet Take 1 tablet (5 mg total) by mouth 2 (two) times daily. 10/01/23   Bryn Bernardino NOVAK, MD  Apoaequorin (PREVAGEN PO) Take 1 tablet by mouth in the morning.    [provider]  atorvastatin  (LIPITOR) 40 MG tablet Take 40 mg by mouth daily at 6 PM.  11/14/16   [provider]  Biotin  10 MG TABS Take 10 mg by mouth in the morning. Once a morning 8am-11am    [provider]  cetirizine  (ZYRTEC ) 10 MG tablet Take 1 tablet (10 mg total) by mouth at bedtime. 11/16/22   Darlean Maus, NP   cholecalciferol  (VITAMIN D3) 25 MCG (1000 UT) tablet Take 1,000 Units by mouth in the morning.    [provider]  cloNIDine  (CATAPRES ) 0.1 MG tablet Take 1 tablet (0.1 mg total) by mouth 3 (three) times daily. Hold if BP < 95 12/12/23   Fargo, Amy E, NP  finasteride  (PROSCAR ) 5 MG tablet Take 5 mg by mouth in the morning.    [provider]  furosemide  (LASIX ) 20 MG tablet Take 20 mg by mouth daily as needed (HOLD UNTIL 01/28/2024).    [provider]  furosemide  (LASIX ) 40 MG tablet Take 1 tablet (40 mg total) by mouth daily. HOLD UNTIL 01/28/2024 01/23/24   Fargo, Amy E, NP  gabapentin  (NEURONTIN ) 100 MG capsule Take 200 mg by mouth 3 (three) times daily.    [provider]  Glucosamine-Chondroitin 750-600 MG TABS Take 1 tablet by mouth 2 (two) times daily.    [provider]  hydrALAZINE  (APRESOLINE ) 50 MG tablet Take 50 mg by mouth 4 (four) times daily. Hold if BP <95    [provider]  ipratropium-albuterol  (DUONEB) 0.5-2.5 (3) MG/3ML SOLN Take 3 mLs by nebulization every 6 (six) hours as needed for up to 7 days. 06/25/24 07/02/24  Darlean Maus, NP  iron  polysaccharides (NIFEREX) 150 MG capsule Take 150 mg by mouth every Monday, Wednesday, and Friday.  [provider]  melatonin 5 MG TABS Take 1 tablet (5 mg total) by mouth at bedtime. 10/13/22   Darlean Maus, NP  metoprolol  tartrate (LOPRESSOR ) 50 MG tablet Take 75 mg by mouth 2 (two) times daily.    [provider]  mineral oil-hydrophilic petrolatum (AQUAPHOR) ointment Apply topically at bedtime. 03/18/24   Darlean Maus, NP  Multiple Vitamin (MULTIVITAMIN WITH MINERALS) TABS tablet Take 1 tablet by mouth in the morning.    [provider]  pantoprazole  (PROTONIX ) 40 MG tablet Take 40 mg by mouth 2 (two) times daily.    [provider]  polyethylene glycol (MIRALAX ) 17 g packet Take 17 g by mouth daily. 02/28/24   Wert, Christina, NP  potassium  chloride SA (K-DUR,KLOR-CON ) 20 MEQ tablet Take 20 mEq by mouth every evening.  07/16/13   [provider]  sennosides-docusate sodium  (SENOKOT-S) 8.6-50 MG tablet Take 2 tablets by mouth at bedtime. 02/28/24   Darlean Maus, NP  tamsulosin  (FLOMAX ) 0.4 MG CAPS capsule Take 1 capsule (0.4 mg total) by mouth daily after supper. 05/15/22   Ezenduka, Nkeiruka J, MD  valsartan  (DIOVAN ) 80 MG tablet Take 1.5 tablets (120 mg total) by mouth daily.  Give 120 mg by mouth in the morning for Hypertension Valsartan  120 mg QD 03/18/24 03/18/25  Wert, Christina, NP    Allergies: Lisinopril and Niaspan [niacin er (antihyperlipidemic)]    Review of Systems  Reason unable to perform ROS: Current mental status.    Updated Vital Signs BP (!) 149/66 (BP Location: Right Arm)   Pulse 73   Temp 97.9 F (36.6 C) (Oral)   Resp 15   Ht 5' 4 (1.626 m)   Wt 81.6 kg   SpO2 94%   BMI 30.90 kg/m   Physical Exam Vitals and nursing note reviewed.   Gen: NAD Eyes: PERRL, EOMI HEENT: no oropharyngeal swelling Neck: trachea midline, no meningismus Resp: clear to auscultation bilaterally Card: RRR, no murmurs, rubs, or gallops Abd: nontender, nondistended, positive CVA tenderness on the left and right Extremities: no calf tenderness, no edema MSK: No thoracic or lumbar spinal tenderness Vascular: 2+ radial pulses bilaterally, 2+ DP pulses bilaterally Neuro: Cranial nerves intact, the patient does have equal strength and sensation throughout the bilateral upper and lower extremities, alert and oriented to person and is able to tell me he is at the hospital Skin: no rashes Psyc: acting appropriately   (all labs ordered are listed, but only abnormal results are displayed) Labs Reviewed  CBC WITH DIFFERENTIAL/PLATELET - Abnormal; Notable for the following components:      Result Value   RBC 3.78 (*)    Hemoglobin 11.1 (*)    HCT 34.2 (*)    All other components within normal limits  COMPREHENSIVE  METABOLIC PANEL WITH GFR - Abnormal; Notable for the following components:   Potassium 5.3 (*)    Glucose, Bld 195 (*)    BUN 77 (*)    Creatinine, Ser 3.18 (*)    Calcium  8.6 (*)    Albumin 2.7 (*)    AST 84 (*)    ALT 62 (*)    Alkaline Phosphatase 141 (*)    Total Bilirubin 1.6 (*)    GFR, Estimated 18 (*)    All other components within normal limits  URINALYSIS, ROUTINE W REFLEX MICROSCOPIC - Abnormal; Notable for the following components:   APPearance TURBID (*)    Protein, ur 30 (*)    Leukocytes,Ua LARGE (*)  Bacteria, UA MANY (*)    All other components within normal limits  RESP PANEL BY RT-PCR (RSV, FLU A&B, COVID)  RVPGX2  AMMONIA    EKG: None  Radiology: CT ABDOMEN PELVIS WO CONTRAST Result Date: 07/07/2024 EXAM: CT ABDOMEN AND PELVIS WITHOUT CONTRAST 07/07/2024 08:18:20 PM TECHNIQUE: CT of the abdomen and pelvis was performed without the administration of intravenous contrast. Multiplanar reformatted images are provided for review. Automated exposure control, iterative reconstruction, and/or weight-based adjustment of the mA/kV was utilized to reduce the radiation dose to as low as reasonably achievable. COMPARISON: None available. CLINICAL HISTORY: Abdominal pain, acute, nonlocalized; AKI, poor historian, eval for obstruction. FINDINGS: LOWER CHEST: Calcified bibasilar granulomas with calcified lymph node noted on the right interlobular region. Aortic valve leaflet calcification, mitral annular calcification, coronary artery calcification. LIVER: A fluid density lesion within the liver likely represents a simple hepatic cyst versus hemangioma. Subcentimeter hypodensities within the left hepatic lobe are too small to characterize (3.16). The liver is otherwise unremarkable. GALLBLADDER AND BILE DUCTS: Gallbladder is unremarkable. No biliary ductal dilatation. SPLEEN: Punctate calcifications throughout the spleen are consistent with the sequelae of prior granulomatous  disease, as seen within the lungs. PANCREAS: Diffusely atrophic pancreas. No focal lesion. Otherwise normal pancreatic contour. No surrounding inflammatory changes. No main pancreatic ductal dilatation. ADRENAL GLANDS: No acute abnormality. KIDNEYS, URETERS AND BLADDER: Atrophic left kidney with renal cortical scarring. Fluid density lesion within the left kidney likely represents a simple renal cyst. Simple renal cysts do not require additional follow-up unless clinically indicated due to signs/symptoms. No stones in the kidneys or ureters. No hydronephrosis. No perinephric or periureteral stranding. Urinary bladder is unremarkable. GI AND BOWEL: Stomach demonstrates no acute abnormality. No small or large bowel thickening or dilatation. The appendix is unremarkable. Colonic diverticulosis. PERITONEUM AND RETROPERITONEUM: No ascites. No free air. VASCULATURE: Severe atherosclerotic plaque of the aorta and its main branches. LYMPH NODES: No lymphadenopathy. REPRODUCTIVE ORGANS: No acute abnormality. BONES AND SOFT TISSUES: Total right hip arthroplasty partially visualized. Multilevel mild-to-moderate degenerative change of the spine. Chronic L1 compression fracture. Density sclerotic lesion of the T12 level, likely a bone island. No focal soft tissue abnormality. IMPRESSION: 1. No acute findings in the abdomen or pelvis with limited evaluation on this noncontrast study. 2. Severe atherosclerotic plaque of the aorta and its main branches. 3. Sequelae of prior granulomatous disease. 4. Other, non-acute and/or normal findings as above. Electronically signed by: Morgane Naveau MD 07/07/2024 08:29 PM EST RP Workstation: [id number]   CT Head Wo Contrast Result Date: 07/07/2024 EXAM: CT HEAD WITHOUT CONTRAST 07/07/2024 07:08:00 PM TECHNIQUE: CT of the head was performed without the administration of intravenous contrast. Automated exposure control, iterative reconstruction, and/or weight based adjustment of the mA/kV  was utilized to reduce the radiation dose to as low as reasonably achievable. COMPARISON: CT head 03/01/2023 CLINICAL HISTORY: Mental status change, unknown cause FINDINGS: BRAIN AND VENTRICLES: No acute hemorrhage. No evidence of acute infarct. No hydrocephalus. No extra-axial collection. No mass effect or midline shift. Patchy white matter hypoattenuations, compatible with chronic and vascular ischemic change. ORBITS: No acute abnormality. SINUSES: No acute abnormality. SOFT TISSUES AND SKULL: No acute soft tissue abnormality. No skull fracture. IMPRESSION: 1. No acute intracranial abnormality. Electronically signed by: Gilmore Molt MD 07/07/2024 07:16 PM EST RP Workstation: [id number]   DG Chest Portable 1 View Result Date: 07/07/2024 CLINICAL DATA:  Altered level of consciousness EXAM: PORTABLE CHEST 1 VIEW COMPARISON:  09/28/2023 FINDINGS: Single frontal view of the chest demonstrates  an unremarkable cardiac silhouette. No acute airspace disease, effusion, or pneumothorax. No acute bony abnormalities. IMPRESSION: 1. No acute intrathoracic process. Electronically Signed   By: Ozell Daring M.D.   On: 07/07/2024 19:05     Procedures   Medications Ordered in the ED  cefTRIAXone  (ROCEPHIN ) 2 g in sodium chloride  0.9 % 100 mL IVPB (has no administration in time range)  lactated ringers  bolus 1,000 mL (has no administration in time range)                                    Medical Decision Making 89 year old male with past medical history of diabetes, hypertension, and hyperlipidemia presenting to the emergency department today with altered mental status.  I will further evaluate the patient here with basic labs Wels a chest x-ray and urinalysis to eval for infectious etiology.  Also obtain an ammonia level.  Will obtain a COVID and flu swab to evaluate for viral etiologies.  Will obtain CT scan of patient's head to evaluate for intracranial hemorrhage or mass lesion.  I will reevaluate for  ultimate disposition.  The patient's labs were consistent with a relatively significant AKI with a creatinine greater than 3.  The patient's baseline is between 1 and 2.  IV fluids are ordered for this.  The patient's urinalysis does show large leukocytes with greater than 50 WBCs.  Will cover the patient with Rocephin .  CT scan of his head is negative.  A call was placed to hospital service for admission.  Amount and/or Complexity of Data Reviewed Labs: ordered. Radiology: ordered.  Risk Decision regarding hospitalization.        Final diagnoses:  Urinary tract infection without hematuria, site unspecified  AKI (acute kidney injury)  Altered mental status, unspecified altered mental status type    ED Discharge Orders     None          Ula Prentice SAUNDERS, MD 07/07/24 2136

## 2024-07-08 ENCOUNTER — Inpatient Hospital Stay (HOSPITAL_COMMUNITY)

## 2024-07-08 DIAGNOSIS — R4182 Altered mental status, unspecified: Secondary | ICD-10-CM

## 2024-07-08 DIAGNOSIS — N179 Acute kidney failure, unspecified: Principal | ICD-10-CM

## 2024-07-08 LAB — CBC
HCT: 29.5 % — ABNORMAL LOW (ref 39.0–52.0)
Hemoglobin: 9.7 g/dL — ABNORMAL LOW (ref 13.0–17.0)
MCH: 29.9 pg (ref 26.0–34.0)
MCHC: 32.9 g/dL (ref 30.0–36.0)
MCV: 91 fL (ref 80.0–100.0)
Platelets: 186 K/uL (ref 150–400)
RBC: 3.24 MIL/uL — ABNORMAL LOW (ref 4.22–5.81)
RDW: 13.4 % (ref 11.5–15.5)
WBC: 6.2 K/uL (ref 4.0–10.5)
nRBC: 0 % (ref 0.0–0.2)

## 2024-07-08 LAB — COMPREHENSIVE METABOLIC PANEL WITH GFR
ALT: 56 U/L — ABNORMAL HIGH (ref 0–44)
AST: 55 U/L — ABNORMAL HIGH (ref 15–41)
Albumin: 2.2 g/dL — ABNORMAL LOW (ref 3.5–5.0)
Alkaline Phosphatase: 121 U/L (ref 38–126)
Anion gap: 17 — ABNORMAL HIGH (ref 5–15)
BUN: 73 mg/dL — ABNORMAL HIGH (ref 8–23)
CO2: 14 mmol/L — ABNORMAL LOW (ref 22–32)
Calcium: 8.3 mg/dL — ABNORMAL LOW (ref 8.9–10.3)
Chloride: 108 mmol/L (ref 98–111)
Creatinine, Ser: 2.72 mg/dL — ABNORMAL HIGH (ref 0.61–1.24)
GFR, Estimated: 21 mL/min — ABNORMAL LOW (ref >60)
Glucose, Bld: 154 mg/dL — ABNORMAL HIGH (ref 70–99)
Potassium: 4.3 mmol/L (ref 3.5–5.1)
Sodium: 139 mmol/L (ref 135–145)
Total Bilirubin: 0.6 mg/dL (ref 0.0–1.2)
Total Protein: 6.2 g/dL — ABNORMAL LOW (ref 6.5–8.1)

## 2024-07-08 MED ORDER — ATORVASTATIN CALCIUM 40 MG PO TABS
40.0000 mg | ORAL_TABLET | Freq: Every day | ORAL | Status: DC
  Administered 2024-07-08 – 2024-07-09 (×2): 40 mg via ORAL
  Filled 2024-07-08 (×2): qty 1

## 2024-07-08 MED ORDER — METOPROLOL TARTRATE 50 MG PO TABS
75.0000 mg | ORAL_TABLET | Freq: Two times a day (BID) | ORAL | Status: DC
  Administered 2024-07-08 – 2024-07-10 (×6): 75 mg via ORAL
  Filled 2024-07-08 (×6): qty 1

## 2024-07-08 MED ORDER — POLYETHYLENE GLYCOL 3350 17 G PO PACK
17.0000 g | PACK | Freq: Every day | ORAL | Status: DC
  Administered 2024-07-08 – 2024-07-10 (×2): 17 g via ORAL
  Filled 2024-07-08 (×3): qty 1

## 2024-07-08 MED ORDER — SODIUM CHLORIDE 0.9 % IV SOLN
2.0000 g | INTRAVENOUS | Status: DC
  Administered 2024-07-08 – 2024-07-09 (×2): 2 g via INTRAVENOUS
  Filled 2024-07-08 (×2): qty 20

## 2024-07-08 MED ORDER — CLONIDINE HCL 0.1 MG PO TABS
0.1000 mg | ORAL_TABLET | Freq: Three times a day (TID) | ORAL | Status: DC
  Administered 2024-07-08 – 2024-07-10 (×7): 0.1 mg via ORAL
  Filled 2024-07-08 (×7): qty 1

## 2024-07-08 MED ORDER — ALLOPURINOL 100 MG PO TABS
100.0000 mg | ORAL_TABLET | Freq: Every day | ORAL | Status: DC
  Administered 2024-07-08 – 2024-07-10 (×3): 100 mg via ORAL
  Filled 2024-07-08 (×3): qty 1

## 2024-07-08 MED ORDER — APIXABAN 2.5 MG PO TABS
2.5000 mg | ORAL_TABLET | Freq: Two times a day (BID) | ORAL | Status: DC
  Administered 2024-07-08 – 2024-07-10 (×4): 2.5 mg via ORAL
  Filled 2024-07-08 (×4): qty 1

## 2024-07-08 MED ORDER — SODIUM CHLORIDE 0.9 % IV SOLN: INTRAVENOUS | Status: DC

## 2024-07-08 MED ORDER — HYDRALAZINE HCL 50 MG PO TABS
50.0000 mg | ORAL_TABLET | Freq: Four times a day (QID) | ORAL | Status: DC
  Administered 2024-07-08 – 2024-07-10 (×9): 50 mg via ORAL
  Filled 2024-07-08 (×9): qty 1

## 2024-07-08 MED ORDER — SENNOSIDES-DOCUSATE SODIUM 8.6-50 MG PO TABS
1.0000 | ORAL_TABLET | ORAL | Status: DC
  Administered 2024-07-08: 1 via ORAL
  Filled 2024-07-08: qty 1

## 2024-07-08 MED ORDER — FINASTERIDE 5 MG PO TABS
5.0000 mg | ORAL_TABLET | Freq: Every day | ORAL | Status: DC
  Administered 2024-07-08 – 2024-07-10 (×3): 5 mg via ORAL
  Filled 2024-07-08 (×3): qty 1

## 2024-07-08 MED ORDER — POLYSACCHARIDE IRON COMPLEX 150 MG PO CAPS
150.0000 mg | ORAL_CAPSULE | ORAL | Status: DC
  Administered 2024-07-09: 150 mg via ORAL
  Filled 2024-07-08: qty 1

## 2024-07-08 MED ORDER — AMLODIPINE BESYLATE 10 MG PO TABS
10.0000 mg | ORAL_TABLET | Freq: Every day | ORAL | Status: DC
  Administered 2024-07-08 – 2024-07-10 (×3): 10 mg via ORAL
  Filled 2024-07-08 (×3): qty 1

## 2024-07-08 NOTE — Plan of Care (Signed)

## 2024-07-08 NOTE — Progress Notes (Signed)
 [facility]  PROGRESS NOTE  Nathan Santiago [facility] FMW:[id number] DOB: 09-19-1930 DOA: 07/07/2024 PCP: Charlanne Fredia CROME, MD   Brief HPI:    89 y.o. male with medical history significant of  CAD, AAA, PAF, hypertension, hyperlipidemia, carotid artery disease and PAD who was seen initially to [facility], day of admission for sick visit due to increase weakness and lethargy. ON evaluation patient urine was note to be cloudy with green mucus due to patient symptoms and high concern for UTI , patient was referred to the ED. Patient is currently alert to self, know he is in a hospital. He no no current complaints, denies n/v/d/ sob /cp/dysuria or abdominal pain     Assessment/Plan:   Acute metabolic encephalopathy -Improving; CT head negative - likely from dehydration and?  UTI -UA was abnormal, urine culture has not been obtained yet  Acute kidney injury on CKD stage IIIb - Creatinine baseline is around 1.7-1.8 - Presented with creatinine of 3.18; likely in setting of dehydration, poor p.o. intake - Creatinine improving, today creatinine is 2.72 - Follow renal ultrasound   ?  UTI -Complaint of dysuria, found to have abnormal UA - Urine culture has all been obtained yet - Will obtain urine culture and follow urine culture results   CAD s/p stent  -on eliquis , aspirin  -continue statin   Transaminitis -Has mild elevation of LFTs -Improving; unclear etiology -Follow LFTs in a.m.    AAA, -followed by cardiology    PAF -stable  -continue Eliquis     Hypertension -amlodipine ,clonidine  -hold lasix  /valsartan  in setting of aki   Hyperlipidemia -continue statin    Carotid artery disease PAD -follows with Dr Sheree  -patient /family opted for conservative treatment of right lower extremity PAD   Gout -continue allopurinol       DVT prophylaxis: Apixaban   Medications     acetaminophen   1,000 mg Oral BID   allopurinol   100 mg Oral Daily   amLODipine    10 mg Oral Daily   apixaban   5 mg Oral BID   atorvastatin   40 mg Oral q1800   cloNIDine   0.1 mg Oral TID   finasteride   5 mg Oral Daily   hydrALAZINE   50 mg Oral QID   [START ON 07/09/2024] iron  polysaccharides  150 mg Oral Q M,W,F   metoprolol  tartrate  75 mg Oral BID   pantoprazole   40 mg Oral BID   polyethylene glycol  17 g Oral Daily   senna-docusate  1 tablet Oral QODAY     Data Reviewed:   CBG:  No results for input(s): GLUCAP in the last 168 hours.  SpO2: 91 %    Vitals:   07/08/24 0100 07/08/24 0143 07/08/24 0458 07/08/24 0751  BP: (!) 136/42 (!) 152/57 (!) 135/44 (!) 153/54  Pulse: 66 74 (!) 58 66  Resp: 14 18 20 17   Temp:  (!) 97.4 F (36.3 C) (!) 97.3 F (36.3 C) 97.9 F (36.6 C)  TempSrc:    Oral  SpO2: 92% 94% 90% 91%  Weight:      Height:          Data Reviewed:  Basic Metabolic Panel: Recent Labs  Lab 07/07/24 1836 07/08/24 0347  NA 138 139  K 5.3* 4.3  CL 108 108  CO2 22 14*  GLUCOSE 195* 154*  BUN 77* 73*  CREATININE 3.18* 2.72*  CALCIUM  8.6* 8.3*    CBC: Recent Labs  Lab 07/07/24 1836 07/08/24 0347  WBC 7.7 6.2  NEUTROABS 5.4  --   HGB 11.1* 9.7*  HCT 34.2* 29.5*  MCV 90.5 91.0  PLT 237 186    LFT Recent Labs  Lab 07/07/24 1836 07/08/24 0347  AST 84* 55*  ALT 62* 56*  ALKPHOS 141* 121  BILITOT 1.6* 0.6  PROT 7.0 6.2*  ALBUMIN 2.7* 2.2*     Antibiotics: Anti-infectives (From admission, onward)    Start     Dose/Rate Route Frequency Ordered Stop   07/08/24 2200  cefTRIAXone  (ROCEPHIN ) 2 g in sodium chloride  0.9 % 100 mL IVPB        2 g 200 mL/hr over 30 Minutes Intravenous Every 24 hours 07/08/24 0231     07/07/24 2130  cefTRIAXone  (ROCEPHIN ) 2 g in sodium chloride  0.9 % 100 mL IVPB        2 g 200 mL/hr over 30 Minutes Intravenous  Once 07/07/24 2129 07/08/24 0044        CONSULTS   Code Status: Full code  Family Communication: Discussed with patient's wife at bedside     Subjective   Denies  any complaints.   Objective    Physical Examination:   Appears in no acute distress S1-S2, regular, no murmur auscultated Abdomen is soft, nontender, no organomegaly Extremities no edema          Nathan Santiago   [facility] If 7PM-7AM, please contact night-coverage at [email or web address], Office  [phone]   07/08/2024, 8:38 AM  LOS: 1 day

## 2024-07-08 NOTE — Progress Notes (Signed)
 Transition of Care [facility] - Inpatient Brief Assessment   Patient Details  Name: Nathan Santiago MRN: [id number] Date of Birth: 10/05/1930  [facility] CM/SW Contact:    Rosaline JONELLE Joe, RN Phone Number: 07/08/2024, 11:46 AM   Clinical Narrative: CM met with the patient and wife at the bedside to discuss IP Care management needs.  The patient admitted from [facility] facility and plans to return to skilled care when he is stable.  Patient has been at the facility for the past 15 months.  Patient's wife stays at [facility].  DME at the facility includes hospital bed, hoyer lift and WC.  Facility is aware of patient admission to the hospital and will need FL2 and discharge summary sent to the facility prior to his return.  PTAR will need to be coordinated once patient is able to return to the facility.  MD asked to place PT eval since patient will need FL2 prior to discharge.   Transition of Care Asessment: Insurance and Status: (P) Insurance coverage has been reviewed Patient has primary care physician: (P) Yes Home environment has been reviewed: (P) from [facility] Prior level of function:: (P) facility care Prior/Current Home Services: (P) No current home services Social Drivers of Health Review: (P) SDOH reviewed needs interventions Readmission risk has been reviewed: (P) Yes Transition of care needs: (P) no transition of care needs at this time

## 2024-07-09 DIAGNOSIS — N390 Urinary tract infection, site not specified: Secondary | ICD-10-CM

## 2024-07-09 LAB — COMPREHENSIVE METABOLIC PANEL WITH GFR
ALT: 57 U/L — ABNORMAL HIGH (ref 0–44)
AST: 47 U/L — ABNORMAL HIGH (ref 15–41)
Albumin: 2.2 g/dL — ABNORMAL LOW (ref 3.5–5.0)
Alkaline Phosphatase: 121 U/L (ref 38–126)
Anion gap: 8 (ref 5–15)
BUN: 60 mg/dL — ABNORMAL HIGH (ref 8–23)
CO2: 23 mmol/L (ref 22–32)
Calcium: 8.3 mg/dL — ABNORMAL LOW (ref 8.9–10.3)
Chloride: 113 mmol/L — ABNORMAL HIGH (ref 98–111)
Creatinine, Ser: 2.62 mg/dL — ABNORMAL HIGH (ref 0.61–1.24)
GFR, Estimated: 22 mL/min — ABNORMAL LOW (ref >60)
Glucose, Bld: 168 mg/dL — ABNORMAL HIGH (ref 70–99)
Potassium: 4 mmol/L (ref 3.5–5.1)
Sodium: 144 mmol/L (ref 135–145)
Total Bilirubin: 0.5 mg/dL (ref 0.0–1.2)
Total Protein: 6.2 g/dL — ABNORMAL LOW (ref 6.5–8.1)

## 2024-07-09 LAB — CBC
HCT: 31.2 % — ABNORMAL LOW (ref 39.0–52.0)
Hemoglobin: 10 g/dL — ABNORMAL LOW (ref 13.0–17.0)
MCH: 29 pg (ref 26.0–34.0)
MCHC: 32.1 g/dL (ref 30.0–36.0)
MCV: 90.4 fL (ref 80.0–100.0)
Platelets: 228 K/uL (ref 150–400)
RBC: 3.45 MIL/uL — ABNORMAL LOW (ref 4.22–5.81)
RDW: 13.5 % (ref 11.5–15.5)
WBC: 7.9 K/uL (ref 4.0–10.5)
nRBC: 0 % (ref 0.0–0.2)

## 2024-07-09 LAB — URINE CULTURE: Culture: NO GROWTH

## 2024-07-09 MED ORDER — SODIUM CHLORIDE 0.9 % IV SOLN: INTRAVENOUS | Status: AC

## 2024-07-09 NOTE — Plan of Care (Signed)

## 2024-07-09 NOTE — Evaluation (Signed)
 Physical Therapy Evaluation Patient Details Name: Nathan Santiago MRN: [id number] DOB: 1930/10/31 Today's Date: 07/09/2024  History of Present Illness  Pt is a 89 y/o male presenting on 07/07/24 with increased weakness and lethargy, found to have a UTI. PMH significant of HTN, HLD, CAD, AAA, PAD  Clinical Impression  Prior to admission, pt was living at [facility] and DEP for all mobility and ADLs. His spouse lives in ILF at [facility]. Pt agreeable to physical therapy evaluation however appears lethargic and requires intermittent tactile cues to remain awake and engaged in session. MAX-TOTAL for rolling in bed with use of bed features and attempted supine>long sitting. Pt appear to be at his functional baseline, and spouse reports he has not been able to walk in >32months but can help pull to stand using a sara stedy. Spouse unsure if therapy was following patient at [facility], but requested referral to help improve his mobility and assistance with transfers. PT will discuss this request with TOC. PT will continue to follow patient while he is in acute hospital, and benefit from continued therapy at discharge to improve his ability to assist with functional mobility tasks to decreased burden of care.      If plan is discharge home, recommend the following: Two people to help with walking and/or transfers;Two people to help with bathing/dressing/bathroom;Direct supervision/assist for medications management;Help with stairs or ramp for entrance;Assist for transportation;Supervision due to cognitive status   Can travel by private vehicle        Equipment Recommendations Other (comment)  Recommendations for Other Services       Functional Status Assessment Patient has not had a recent decline in their functional status     Precautions / Restrictions Precautions Precautions: Fall Recall of Precautions/Restrictions: Impaired Restrictions Weight Bearing Restrictions Per Provider Order:  No      Mobility  Bed Mobility Overal bed mobility: Needs Assistance Bed Mobility: Rolling, Supine to Sit Rolling: Total assist, Max assist, Used rails   Supine to sit: Total assist, Max assist, HOB elevated, Used rails     General bed mobility comments: attempt to have patient pull to sit up using bedrails, however unabl to achieve requiring MAX-TOTAL assist with continual multimodal cues for sequencing and safety with task    Transfers Overall transfer level:  (deferred attempt at time of evaluation secondary to pt requiring +2 assist with hoyer or stedy at baseline; will plan for +2 to attempt OOB mobility at next session)                      Ambulation/Gait               General Gait Details: pt does not ambulate at baseline  Stairs            Wheelchair Mobility     Tilt Bed    Modified Rankin (Stroke Patients Only)       Balance Overall balance assessment: Needs assistance     Sitting balance - Comments: based on clinical judgement and need for max-total +1-2 assist at baseline                                     Pertinent Vitals/Pain Pain Assessment Pain Assessment: No/denies pain    Home Living Family/patient expects to be discharged to:: Private residence [facility]) Living Arrangements: Alone (spouse lives at [facility]) Available Help at Discharge:  Available 24 hours/day (staff assistance at LTC) Type of Home: Independent living facility (LTC side of [facility]) Home Access: Level entry       Home Layout: One level Home Equipment: Hospital bed;Wheelchair - manual;Other (comment) (hoyer, stedy) Additional Comments: per chart review and information from spouse as patient continues to have some cognitive deficits; pt has been in LTC at [facility] for the last 6 months requiring total assistance for all ADLs, IADLs and mobility    Prior Function Prior Level of Function : Needs assist  Cognitive Assist :  ADLs (cognitive)   ADLs (Cognitive): Step by step cues Physical Assist : ADLs (physical);Mobility (physical) Mobility (physical): Bed mobility;Transfers (mobilizes at [facility] level, has not been able to walk in >68mos) ADLs (physical): Bathing;Dressing;Toileting;IADLs;Grooming Mobility Comments: requires use of hoyer or stedy for transfers to/from [facility] and mobility into the bathroom. per spouse, patient has not had any functional improvements or been able to walk in >1months ADLs Comments: requires assistance for all ADLs and IADLs     Extremity/Trunk Assessment   Upper Extremity Assessment Upper Extremity Assessment: Generalized weakness    Lower Extremity Assessment Lower Extremity Assessment: Generalized weakness;LLE deficits/detail LLE Deficits / Details: reports intermittent L knee pain, h/o TKA       Communication   Communication Communication: No apparent difficulties Factors Affecting Communication: Reduced clarity of speech;Difficulty expressing self    Cognition Arousal: Alert, Lethargic Behavior During Therapy: WFL for tasks assessed/performed   PT - Cognitive impairments: Orientation, Awareness, Memory, Sequencing, Problem solving, Safety/Judgement   Orientation impairments: Time, Situation                     Following commands: Impaired Following commands impaired: Follows one step commands with increased time, Follows one step commands inconsistently     Cueing Cueing Techniques: Tactile cues, Verbal cues     General Comments      Exercises     Assessment/Plan    PT Assessment Patient needs continued PT services  PT Problem List Decreased strength;Decreased activity tolerance;Decreased safety awareness;Decreased balance;Decreased mobility;Decreased cognition;Obesity       PT Treatment Interventions Functional mobility training;Therapeutic activities;Therapeutic exercise;Patient/family education    PT Goals (Current goals can be found in the Care  Plan section)  Acute Rehab PT Goals Patient Stated Goal: help more with transfers PT Goal Formulation: With family Time For Goal Achievement: 07/23/24 Potential to Achieve Goals: Poor    Frequency Min 2X/week     Co-evaluation               AM-PAC PT 6 Clicks Mobility  Outcome Measure Help needed turning from your back to your side while in a flat bed without using bedrails?: Total Help needed moving from lying on your back to sitting on the side of a flat bed without using bedrails?: Total Help needed moving to and from a bed to a chair (including a wheelchair)?: Total Help needed standing up from a chair using your arms (e.g., wheelchair or bedside chair)?: Total Help needed to walk in hospital room?: Total Help needed climbing 3-5 steps with a railing? : Total 6 Click Score: 6    End of Session   Activity Tolerance: Patient tolerated treatment well;Patient limited by fatigue (pt began falling asleep after mobility assessment) Patient left: in bed;with call bell/phone within reach;with family/visitor present;with bed alarm set   PT Visit Diagnosis: Muscle weakness (generalized) (M62.81);Other abnormalities of gait and mobility (R26.89)    Time: 1245-1315 PT Time Calculation (min) (  ACUTE ONLY): 30 min   Charges:   PT Evaluation $PT Eval Low Complexity: 1 Low PT Treatments $Therapeutic Activity: 8-22 mins           Isaiah DEL. Argie Lober, PT, DPT   [employer] 07/09/2024, 2:06 PM

## 2024-07-09 NOTE — Progress Notes (Signed)
 PROGRESS NOTE    Nathan Santiago [facility]  FMW:[id number] DOB: March 24, 1931 DOA: 07/07/2024 PCP: Charlanne Fredia CROME, MD   Brief Narrative:   89 y.o. male with medical history significant of  CAD, AAA, PAF, hypertension, hyperlipidemia, carotid artery disease and PAD who was seen initially to [facility] care at [facility], day of admission for sick visit due to increase weakness and lethargy. ON evaluation patient urine was note to be cloudy with green mucus due to patient symptoms and high concern for UTI , patient was referred to the ED. Patient is currently alert to self, know he is in a hospital. He no no current complaints, denies n/v/d/ sob /cp/dysuria or abdominal pain   Assessment & Plan:   Principal Problem:   UTI (urinary tract infection)  Acute metabolic encephalopathy, POA: CT head negative, improving.  Likely secondary to dehydration and UTI.  Treat underlying cause.   Acute kidney injury on CKD stage IIIb, POA: Baseline creatinine around 1.7-1.8, presented with 3.18 which is slightly improved compared yesterday to 2.6 but still far from baseline.  Will continue IV fluids, increase from 75 cc to 125 cc/h.  Renal ultrasound unremarkable.  UTI: Continue Rocephin  and follow culture.  CAD s/p stent  -on eliquis , aspirin  -continue statin    Transaminitis -Has mild elevation of LFTs -Improving; unclear etiology -Follow LFTs in a.m.    AAA, -followed by cardiology    PAF -stable  -continue Eliquis     Hypertension -amlodipine ,clonidine  -hold lasix  /valsartan  in setting of aki   Hyperlipidemia -continue statin    Carotid artery disease PAD -follows with Dr Sheree  -patient /family opted for conservative treatment of right lower extremity PAD   Gout -continue allopurinol      DVT prophylaxis: apixaban  (ELIQUIS ) tablet 2.5 mg Start: 07/08/24 2200   Code Status: Full Code  Family Communication: Wife present at bedside.  Plan of care discussed with patient in length and he/she  verbalized understanding and agreed with it.  Status is: Inpatient Remains inpatient appropriate because: Needs IV fluids, still creatinine is elevated.  Plan to discharge tomorrow hopefully.   Estimated body mass index is 30.9 kg/m as calculated from the following:   Height as of this encounter: 5' 4 (1.626 m).   Weight as of this encounter: 81.6 kg.    Nutritional Assessment: Body mass index is 30.9 kg/m.SABRA Seen by dietician.  I agree with the assessment and plan as outlined below: Nutrition Status:        . Skin Assessment: I have examined the patient's skin and I agree with the wound assessment as performed by the wound care RN as outlined below:    Consultants:  None  Procedures:  None  Antimicrobials:  Anti-infectives (From admission, onward)    Start     Dose/Rate Route Frequency Ordered Stop   07/08/24 2200  cefTRIAXone  (ROCEPHIN ) 2 g in sodium chloride  0.9 % 100 mL IVPB        2 g 200 mL/hr over 30 Minutes Intravenous Every 24 hours 07/08/24 0231     07/07/24 2130  cefTRIAXone  (ROCEPHIN ) 2 g in sodium chloride  0.9 % 100 mL IVPB        2 g 200 mL/hr over 30 Minutes Intravenous  Once 07/07/24 2129 07/08/24 0044         Subjective: Patient seen and examined, he is fully alert and oriented, he has no complaints at all.  Wife at the bedside.  Plan of care and discharge discussed with them and they  had no questions.  Objective: Vitals:   07/08/24 1549 07/08/24 2015 07/09/24 0026 07/09/24 0747  BP: (!) 138/42 (!) 140/44 (!) 134/43 (!) 154/35  Pulse: 65 70 65 72  Resp:  20 20 17   Temp: 97.9 F (36.6 C) 98 F (36.7 C) 98.6 F (37 C) (!) 97.4 F (36.3 C)  TempSrc:  Oral Oral Oral  SpO2: 94% 93% 94% 94%  Weight:      Height:        Intake/Output Summary (Last 24 hours) at 07/09/2024 0749 Last data filed at 07/09/2024 0445 Gross per 24 hour  Intake 1362.34 ml  Output 400 ml  Net 962.34 ml   Filed Weights   07/07/24 1837  Weight: 81.6 kg     Examination:  General exam: Appears calm and comfortable  Respiratory system: Clear to auscultation. Respiratory effort normal. Cardiovascular system: S1 & S2 heard, RRR. No JVD, murmurs, rubs, gallops or clicks. No pedal edema. Gastrointestinal system: Abdomen is nondistended, soft and nontender. No organomegaly or masses felt. Normal bowel sounds heard. Central nervous system: Alert and oriented. No focal neurological deficits. Extremities: Symmetric 5 x 5 power. Skin: No rashes, lesions or ulcers Psychiatry: Judgement and insight appear normal. Mood & affect appropriate.    Data Reviewed: I have personally reviewed following labs and imaging studies  CBC: Recent Labs  Lab 07/07/24 1836 07/08/24 0347 07/09/24 0333  WBC 7.7 6.2 7.9  NEUTROABS 5.4  --   --   HGB 11.1* 9.7* 10.0*  HCT 34.2* 29.5* 31.2*  MCV 90.5 91.0 90.4  PLT 237 186 228   Basic Metabolic Panel: Recent Labs  Lab 07/07/24 1836 07/08/24 0347 07/09/24 0333  NA 138 139 144  K 5.3* 4.3 4.0  CL 108 108 113*  CO2 22 14* 23  GLUCOSE 195* 154* 168*  BUN 77* 73* 60*  CREATININE 3.18* 2.72* 2.62*  CALCIUM  8.6* 8.3* 8.3*   GFR: Estimated Creatinine Clearance: 17.7 mL/min (A) (by C-G formula based on SCr of 2.62 mg/dL (H)). Liver Function Tests: Recent Labs  Lab 07/07/24 1836 07/08/24 0347 07/09/24 0333  AST 84* 55* 47*  ALT 62* 56* 57*  ALKPHOS 141* 121 121  BILITOT 1.6* 0.6 0.5  PROT 7.0 6.2* 6.2*  ALBUMIN 2.7* 2.2* 2.2*   No results for input(s): LIPASE, AMYLASE in the last 168 hours. Recent Labs  Lab 07/07/24 2006  AMMONIA 23   Coagulation Profile: No results for input(s): INR, PROTIME in the last 168 hours. Cardiac Enzymes: No results for input(s): CKTOTAL, CKMB, CKMBINDEX, TROPONINI in the last 168 hours. BNP (last 3 results) No results for input(s): PROBNP in the last 8760 hours. HbA1C: No results for input(s): HGBA1C in the last 72 hours. CBG: No results for  input(s): GLUCAP in the last 168 hours. Lipid Profile: No results for input(s): CHOL, HDL, LDLCALC, TRIG, CHOLHDL, LDLDIRECT in the last 72 hours. Thyroid  Function Tests: No results for input(s): TSH, T4TOTAL, FREET4, T3FREE, THYROIDAB in the last 72 hours. Anemia Panel: No results for input(s): VITAMINB12, FOLATE, FERRITIN, TIBC, IRON , RETICCTPCT in the last 72 hours. Sepsis Labs: No results for input(s): PROCALCITON, LATICACIDVEN in the last 168 hours.  Recent Results (from the past 240 hours)  Resp panel by RT-PCR (RSV, Flu A&B, Covid) Anterior Nasal Swab     Status: None   Collection Time: 07/07/24  8:06 PM   Specimen: Anterior Nasal Swab  Result Value Ref Range Status   SARS Coronavirus 2 by RT PCR NEGATIVE NEGATIVE Final  Influenza A by PCR NEGATIVE NEGATIVE Final   Influenza B by PCR NEGATIVE NEGATIVE Final    Comment: (NOTE) The Xpert Xpress SARS-CoV-2/FLU/RSV plus assay is intended as an aid in the diagnosis of influenza from Nasopharyngeal swab specimens and should not be used as a sole basis for treatment. Nasal washings and aspirates are unacceptable for Xpert Xpress SARS-CoV-2/FLU/RSV testing.  Fact Sheet for Patients: bloggercourse.com  Fact Sheet for Healthcare Providers: seriousbroker.it  This test is not yet approved or cleared by the [place]  FDA and has been authorized for detection and/or diagnosis of SARS-CoV-2 by FDA under an Emergency Use Authorization (EUA). This EUA will remain in effect (meaning this test can be used) for the duration of the COVID-19 declaration under Section 564(b)(1) of the Act, 21 U.S.C. section 360bbb-3(b)(1), unless the authorization is terminated or revoked.     Resp Syncytial Virus by PCR NEGATIVE NEGATIVE Final    Comment: (NOTE) Fact Sheet for Patients: bloggercourse.com  Fact Sheet for Healthcare  Providers: seriousbroker.it  This test is not yet approved or cleared by the [place]  FDA and has been authorized for detection and/or diagnosis of SARS-CoV-2 by FDA under an Emergency Use Authorization (EUA). This EUA will remain in effect (meaning this test can be used) for the duration of the COVID-19 declaration under Section 564(b)(1) of the Act, 21 U.S.C. section 360bbb-3(b)(1), unless the authorization is terminated or revoked.  Performed at [facility], [address], [place], [place] [address]      Radiology Studies: US  RENAL Result Date: 07/08/2024 CLINICAL DATA:  Acute kidney injury. EXAM: RENAL / URINARY TRACT ULTRASOUND COMPLETE COMPARISON:  Abdomen and pelvis CT dated 07/07/2024. FINDINGS: Right Kidney: Renal measurements: 11.8 x 6.8 x 6.1 cm = volume: 256 mL. Echogenicity within normal limits. No mass or hydronephrosis visualized. Left Kidney: Renal measurements: 9.6 x 5.2 x 5.1 cm = volume: 132 mL. Diffuse cortical thinning. Echogenicity within normal limits. No mass or hydronephrosis visualized. Bladder: Appears normal for degree of bladder distention. Other: The examination was limited by patient body habitus and lack of cooperation. IMPRESSION: 1. No acute abnormality. 2. Left renal atrophy. Electronically Signed   By: Elspeth Bathe M.D.   On: 07/08/2024 15:18   CT ABDOMEN PELVIS WO CONTRAST Result Date: 07/07/2024 EXAM: CT ABDOMEN AND PELVIS WITHOUT CONTRAST 07/07/2024 08:18:20 PM TECHNIQUE: CT of the abdomen and pelvis was performed without the administration of intravenous contrast. Multiplanar reformatted images are provided for review. Automated exposure control, iterative reconstruction, and/or weight-based adjustment of the mA/kV was utilized to reduce the radiation dose to as low as reasonably achievable. COMPARISON: None available. CLINICAL HISTORY: Abdominal pain, acute, nonlocalized; AKI, poor historian, eval for obstruction.  FINDINGS: LOWER CHEST: Calcified bibasilar granulomas with calcified lymph node noted on the right interlobular region. Aortic valve leaflet calcification, mitral annular calcification, coronary artery calcification. LIVER: A fluid density lesion within the liver likely represents a simple hepatic cyst versus hemangioma. Subcentimeter hypodensities within the left hepatic lobe are too small to characterize (3.16). The liver is otherwise unremarkable. GALLBLADDER AND BILE DUCTS: Gallbladder is unremarkable. No biliary ductal dilatation. SPLEEN: Punctate calcifications throughout the spleen are consistent with the sequelae of prior granulomatous disease, as seen within the lungs. PANCREAS: Diffusely atrophic pancreas. No focal lesion. Otherwise normal pancreatic contour. No surrounding inflammatory changes. No main pancreatic ductal dilatation. ADRENAL GLANDS: No acute abnormality. KIDNEYS, URETERS AND BLADDER: Atrophic left kidney with renal cortical scarring. Fluid density lesion within the left kidney likely  represents a simple renal cyst. Simple renal cysts do not require additional follow-up unless clinically indicated due to signs/symptoms. No stones in the kidneys or ureters. No hydronephrosis. No perinephric or periureteral stranding. Urinary bladder is unremarkable. GI AND BOWEL: Stomach demonstrates no acute abnormality. No small or large bowel thickening or dilatation. The appendix is unremarkable. Colonic diverticulosis. PERITONEUM AND RETROPERITONEUM: No ascites. No free air. VASCULATURE: Severe atherosclerotic plaque of the aorta and its main branches. LYMPH NODES: No lymphadenopathy. REPRODUCTIVE ORGANS: No acute abnormality. BONES AND SOFT TISSUES: Total right hip arthroplasty partially visualized. Multilevel mild-to-moderate degenerative change of the spine. Chronic L1 compression fracture. Density sclerotic lesion of the T12 level, likely a bone island. No focal soft tissue abnormality. IMPRESSION:  1. No acute findings in the abdomen or pelvis with limited evaluation on this noncontrast study. 2. Severe atherosclerotic plaque of the aorta and its main branches. 3. Sequelae of prior granulomatous disease. 4. Other, non-acute and/or normal findings as above. Electronically signed by: Morgane Naveau MD 07/07/2024 08:29 PM EST RP Workstation: [id number]   CT Head Wo Contrast Result Date: 07/07/2024 EXAM: CT HEAD WITHOUT CONTRAST 07/07/2024 07:08:00 PM TECHNIQUE: CT of the head was performed without the administration of intravenous contrast. Automated exposure control, iterative reconstruction, and/or weight based adjustment of the mA/kV was utilized to reduce the radiation dose to as low as reasonably achievable. COMPARISON: CT head 03/01/2023 CLINICAL HISTORY: Mental status change, unknown cause FINDINGS: BRAIN AND VENTRICLES: No acute hemorrhage. No evidence of acute infarct. No hydrocephalus. No extra-axial collection. No mass effect or midline shift. Patchy white matter hypoattenuations, compatible with chronic and vascular ischemic change. ORBITS: No acute abnormality. SINUSES: No acute abnormality. SOFT TISSUES AND SKULL: No acute soft tissue abnormality. No skull fracture. IMPRESSION: 1. No acute intracranial abnormality. Electronically signed by: Gilmore Molt MD 07/07/2024 07:16 PM EST RP Workstation: [id number]   DG Chest Portable 1 View Result Date: 07/07/2024 CLINICAL DATA:  Altered level of consciousness EXAM: PORTABLE CHEST 1 VIEW COMPARISON:  09/28/2023 FINDINGS: Single frontal view of the chest demonstrates an unremarkable cardiac silhouette. No acute airspace disease, effusion, or pneumothorax. No acute bony abnormalities. IMPRESSION: 1. No acute intrathoracic process. Electronically Signed   By: Ozell Daring M.D.   On: 07/07/2024 19:05    Scheduled Meds:  acetaminophen   1,000 mg Oral BID   allopurinol   100 mg Oral Daily   amLODipine   10 mg Oral Daily   apixaban   2.5 mg Oral BID    atorvastatin   40 mg Oral q1800   cloNIDine   0.1 mg Oral TID   finasteride   5 mg Oral Daily   hydrALAZINE   50 mg Oral QID   iron  polysaccharides  150 mg Oral Q M,W,F   metoprolol  tartrate  75 mg Oral BID   pantoprazole   40 mg Oral BID   polyethylene glycol  17 g Oral Daily   senna-docusate  1 tablet Oral QODAY   Continuous Infusions:  sodium chloride      cefTRIAXone  (ROCEPHIN )  IV Stopped (07/08/24 2257)     LOS: 2 days   Fredia Skeeter, MD [facility]  07/09/2024, 7:49 AM   *Please note that this is a verbal dictation therefore any spelling or grammatical errors are due to the Dragon Medical One system interpretation.  Please page via Amion and do not message via secure chat for urgent patient care matters. Secure chat can be used for non urgent patient care matters.  How to contact the [facility] Attending or Consulting provider 7A -  7P or covering provider during after hours 7P -7A, for this patient?  Check the care team in [facility] and look for a) attending/consulting [facility] provider listed and b) the [facility] team listed. Page or secure chat 7A-7P. Log into [email or web address] and use [facility] universal password to access. If you do not have the password, please contact the hospital operator. Locate the [facility] provider you are looking for under [facility] and page to a number that you can be directly reached. If you still have difficulty reaching the provider, please page the [facility] (Director on Call) for the Hospitalists listed on amion for assistance.

## 2024-07-10 ENCOUNTER — Encounter (HOSPITAL_COMMUNITY): Payer: Self-pay | Admitting: Internal Medicine

## 2024-07-10 ENCOUNTER — Non-Acute Institutional Stay (SKILLED_NURSING_FACILITY): Payer: Self-pay | Admitting: Adult Health

## 2024-07-10 ENCOUNTER — Encounter: Payer: Self-pay | Admitting: Adult Health

## 2024-07-10 DIAGNOSIS — N390 Urinary tract infection, site not specified: Secondary | ICD-10-CM | POA: Diagnosis not present

## 2024-07-10 DIAGNOSIS — R413 Other amnesia: Secondary | ICD-10-CM

## 2024-07-10 DIAGNOSIS — G9341 Metabolic encephalopathy: Secondary | ICD-10-CM

## 2024-07-10 DIAGNOSIS — N179 Acute kidney failure, unspecified: Secondary | ICD-10-CM

## 2024-07-10 DIAGNOSIS — N1832 Chronic kidney disease, stage 3b: Secondary | ICD-10-CM

## 2024-07-10 DIAGNOSIS — I739 Peripheral vascular disease, unspecified: Secondary | ICD-10-CM

## 2024-07-10 DIAGNOSIS — I480 Paroxysmal atrial fibrillation: Secondary | ICD-10-CM

## 2024-07-10 DIAGNOSIS — M10371 Gout due to renal impairment, right ankle and foot: Secondary | ICD-10-CM

## 2024-07-10 DIAGNOSIS — D509 Iron deficiency anemia, unspecified: Secondary | ICD-10-CM

## 2024-07-10 DIAGNOSIS — N401 Enlarged prostate with lower urinary tract symptoms: Secondary | ICD-10-CM

## 2024-07-10 DIAGNOSIS — I10 Essential (primary) hypertension: Secondary | ICD-10-CM

## 2024-07-10 DIAGNOSIS — H1033 Unspecified acute conjunctivitis, bilateral: Secondary | ICD-10-CM

## 2024-07-10 DIAGNOSIS — E1122 Type 2 diabetes mellitus with diabetic chronic kidney disease: Secondary | ICD-10-CM

## 2024-07-10 DIAGNOSIS — I5032 Chronic diastolic (congestive) heart failure: Secondary | ICD-10-CM

## 2024-07-10 DIAGNOSIS — R053 Chronic cough: Secondary | ICD-10-CM

## 2024-07-10 LAB — COMPREHENSIVE METABOLIC PANEL WITH GFR
ALT: 52 U/L — ABNORMAL HIGH (ref 0–44)
AST: 35 U/L (ref 15–41)
Albumin: 2.3 g/dL — ABNORMAL LOW (ref 3.5–5.0)
Alkaline Phosphatase: 117 U/L (ref 38–126)
Anion gap: 9 (ref 5–15)
BUN: 38 mg/dL — ABNORMAL HIGH (ref 8–23)
CO2: 23 mmol/L (ref 22–32)
Calcium: 8.5 mg/dL — ABNORMAL LOW (ref 8.9–10.3)
Chloride: 114 mmol/L — ABNORMAL HIGH (ref 98–111)
Creatinine, Ser: 1.58 mg/dL — ABNORMAL HIGH (ref 0.61–1.24)
GFR, Estimated: 41 mL/min — ABNORMAL LOW (ref >60)
Glucose, Bld: 139 mg/dL — ABNORMAL HIGH (ref 70–99)
Potassium: 3.7 mmol/L (ref 3.5–5.1)
Sodium: 146 mmol/L — ABNORMAL HIGH (ref 135–145)
Total Bilirubin: 0.4 mg/dL (ref 0.0–1.2)
Total Protein: 6.7 g/dL (ref 6.5–8.1)

## 2024-07-10 MED ORDER — GENTAMICIN SULFATE 0.3 % OP SOLN: 2.0000 [drp] | Freq: Four times a day (QID) | OPHTHALMIC | 0 refills | Status: DC

## 2024-07-10 MED ORDER — ATORVASTATIN CALCIUM 20 MG PO TABS: 20.0000 mg | ORAL_TABLET | Freq: Every day | ORAL | 0 refills | Status: DC

## 2024-07-10 MED ORDER — HYDRALAZINE HCL 20 MG/ML IJ SOLN
10.0000 mg | INTRAMUSCULAR | Status: DC | PRN
  Administered 2024-07-10: 10 mg via INTRAVENOUS
  Filled 2024-07-10: qty 1

## 2024-07-10 MED ORDER — APIXABAN 2.5 MG PO TABS: 2.5000 mg | ORAL_TABLET | Freq: Two times a day (BID) | ORAL | 0 refills | Status: DC

## 2024-07-10 NOTE — TOC Transition Note (Signed)
 Transition of Care [facility]) - Discharge Note   Patient Details  Name: Nathan Santiago MRN: [id number] Date of Birth: [birth year]  Transition of Care [facility]) CM/SW Contact:  Sherline Clack, LCSWA Phone Number: 07/10/2024, 11:59 AM   Clinical Narrative:     Patient will DC to: [facility] Anticipated DC date: 07/10/2024  Family notified: Spouse at bedside Transport by: [facility]   Per MD patient ready for DC to [facility] . RN to call report prior to discharge [phone] for room 149 ). RN, patient, patient's family, and facility notified of DC. Discharge Summary and FL2 sent to facility. DC packet on chart. Ambulance transport requested for patient.   CSW will sign off for now as social work intervention is no longer needed. Please consult us  again if new needs arise.    Final next level of care: Skilled Nursing Facility Barriers to Discharge: Barriers Resolved   Patient Goals and CMS Choice     Choice offered to / list presented to : Patient, Spouse      Discharge Placement   Existing PASRR number confirmed : 07/10/24          Patient chooses bed at: [facility] Patient to be transferred to facility by: [facility] Name of family member notified: Madeline Amy/spouse Patient and family notified of of transfer: 07/10/24  Discharge Plan and Services Additional resources added to the After Visit Summary for                                       Social Drivers of Health (SDOH) Interventions SDOH Screenings   Food Insecurity: Patient Unable To Answer (07/08/2024)  Housing: Patient Unable To Answer (07/08/2024)  Transportation Needs: No Transportation Needs (09/26/2023)  Utilities: Not At Risk (09/26/2023)  Alcohol  Screen: Low Risk (11/28/2022)  Depression (PHQ2-9): Low Risk (01/11/2024)  Financial Resource Strain: Low Risk (11/28/2022)  Physical Activity: Insufficiently Active (11/28/2022)  Social Connections: Unknown (07/08/2024)  Stress: No Stress  Concern Present (11/28/2022)  Tobacco Use: Medium Risk (07/07/2024)     Readmission Risk Interventions    07/08/2024   11:44 AM 07/10/2022    2:59 PM  Readmission Risk Prevention Plan  Post Dischage Appt  Complete  Medication Screening  Complete  Transportation Screening Complete Complete  PCP or Specialist Appt within 5-7 Days Complete   Home Care Screening Complete   Medication Review (RN CM) Referral to Pharmacy

## 2024-07-10 NOTE — Discharge Summary (Addendum)
 Physician Discharge Summary  Nathan Santiago [facility] FMW:[id number] DOB: Apr 05, 1931 DOA: 07/07/2024  PCP: Charlanne Fredia CROME, MD  Admit date: 07/07/2024 Discharge date: 07/10/2024 30 Day Unplanned Readmission Risk Score    Flowsheet Row ED to Hosp-Admission (Current) from 07/07/2024 in [place] [facility]  30 Day Unplanned Readmission Risk Score (%) 17.67 Filed at 07/10/2024 0801    This score is the patient's risk of an unplanned readmission within 30 days of being discharged (0 -100%). The score is based on dignosis, age, lab data, medications, orders, and past utilization.   Low:  0-14.9   Medium: 15-21.9   High: 22-29.9   Extreme: 30 and above          Admitted From: Assisted living facility Disposition: SNF  Recommendations for Outpatient Follow-up:  Follow up with PCP in 1-2 weeks Please obtain BMP/CBC in one week Please follow up with your PCP on the following pending results: Unresulted Labs (From admission, onward)    None         Home Health: None Equipment/Devices: None  Discharge Condition: Stable CODE STATUS: DNR-patient remained full code during the whole hospitalization however prior to discharge, it was revealed by the wife that patient is supposed to be DNR.  I have verified this with the wife.  Changed his CODE STATUS to DNR in the chart and signed gold form for him. Diet recommendation:  Diet Order             Diet Heart Room service appropriate? Yes; Fluid consistency: Thin  Diet effective now                   Subjective: Seen and examined, fully alert and oriented, patient has no complaints.  Wife and son at the bedside.  Brief/Interim Summary:  89 y.o. male with medical history significant of  CAD, AAA, PAF, hypertension, hyperlipidemia, carotid artery disease and PAD who was seen initially to [facility] at [facility], day of admission for sick visit due to increase weakness and lethargy. ON evaluation patient urine was note to be  cloudy with green mucus due to patient symptoms and high concern for UTI , patient was referred to the ED. admitted for acute encephalopathy.  Details of the hospitalization below.  Acute metabolic encephalopathy, POA: CT head negative, improving.  Likely secondary to dehydration and UTI.  Patient is fully alert and oriented.   Acute kidney injury on CKD stage IIIb, POA: Baseline creatinine around 1.7-1.8, presented with 3.18 which has improved and down to 1.5 today after IV fluids given.     UTI: Patient received 4 to 5 days of Rocephin , he has no urinary complaints, no fever and urine culture was negative.  No further antibiotics prescribed at discharge.   CAD s/p stent  -on eliquis , aspirin  -continue statin    Transaminitis -Has mild elevation of LFTs -Improving; unclear etiology.  Recommend rechecking LFT in 1 week at PCPs office.    AAA, -followed by cardiology    PAF -stable  -continue Eliquis     Hypertension -amlodipine ,clonidine  - Held lasix  /valsartan  in setting of aki however this has resolved and blood pressure is rising so recommend resuming these medications starting tomorrow as well.   Hyperlipidemia PTA on atorvastatin  40 mg but recent LDL was only 59.  At age 48 with elevated LFTs, he likely does not need high intensity atorvastatin  so I am reducing the dose to 20 mg.   Carotid artery disease PAD -follows with Dr  Sheree  -patient scott opted for conservative treatment of right lower extremity PAD   Gout -continue allopurinol    Discharge plan was discussed with patient and/or family member and they verbalized understanding and agreed with it.  Discharge Diagnoses:  Principal Problem:   UTI (urinary tract infection)    Discharge Instructions   Allergies as of 07/10/2024       Reactions   Lisinopril Cough   Niaspan [niacin Er (antihyperlipidemic)] Rash        Medication List     TAKE these medications    acetaminophen  500 MG tablet Commonly  known as: TYLENOL  Take 1,000 mg by mouth every 12 (twelve) hours as needed for mild pain (pain score 1-3).   allopurinol  100 MG tablet Commonly known as: ZYLOPRIM  Take 1 tablet (100 mg total) by mouth daily.   amLODipine  10 MG tablet Commonly known as: NORVASC  Take 10 mg by mouth in the morning. Hold if BP <95   apixaban  2.5 MG Tabs tablet Commonly known as: ELIQUIS  Take 1 tablet (2.5 mg total) by mouth 2 (two) times daily. What changed:  medication strength how much to take   Apoaequorin 10 MG Caps Take 10 mg by mouth in the morning.   atorvastatin  20 MG tablet Commonly known as: Lipitor Take 1 tablet (20 mg total) by mouth daily. What changed:  medication strength how much to take when to take this   Biotin  10 MG Tabs Take 10 mg by mouth in the morning. Once a morning 8am-11am   cetirizine  10 MG tablet Commonly known as: ZYRTEC  Take 1 tablet (10 mg total) by mouth at bedtime.   cholecalciferol  25 MCG (1000 UNIT) tablet Commonly known as: VITAMIN D3 Take 1,000 Units by mouth in the morning.   cloNIDine  0.1 MG tablet Commonly known as: Catapres  Take 1 tablet (0.1 mg total) by mouth 3 (three) times daily. Hold if BP < 95   finasteride  5 MG tablet Commonly known as: PROSCAR  Take 5 mg by mouth in the morning.   furosemide  20 MG tablet Commonly known as: LASIX  Take 20 mg by mouth daily as needed (HOLD UNTIL 01/28/2024).   furosemide  40 MG tablet Commonly known as: LASIX  Take 1 tablet (40 mg total) by mouth daily. HOLD UNTIL 01/28/2024   gabapentin  100 MG capsule Commonly known as: NEURONTIN  Take 200 mg by mouth 3 (three) times daily.   Glucosamine-Chondroitin 750-600 MG Tabs Take 1 tablet by mouth 2 (two) times daily.   hydrALAZINE  50 MG tablet Commonly known as: APRESOLINE  Take 50 mg by mouth 4 (four) times daily. Hold if BP <95   iron  polysaccharides 150 MG capsule Commonly known as: NIFEREX Take 150 mg by mouth every Monday, Wednesday, and Friday.    melatonin 5 MG Tabs Take 1 tablet (5 mg total) by mouth at bedtime.   metoprolol  tartrate 50 MG tablet Commonly known as: LOPRESSOR  Take 75 mg by mouth 2 (two) times daily.   multivitamin with minerals Tabs tablet Take 1 tablet by mouth in the morning.   pantoprazole  40 MG tablet Commonly known as: PROTONIX  Take 40 mg by mouth 2 (two) times daily.   polyethylene glycol 17 g packet Commonly known as: MiraLax  Take 17 g by mouth daily.   potassium chloride  SA 20 MEQ tablet Commonly known as: KLOR-CON  M Take 20 mEq by mouth every evening.   PreviDent 1.1 % Gel dental gel Generic drug: sodium fluoride Place 1 Application onto teeth at bedtime.   sennosides-docusate sodium  8.6-50 MG  tablet Commonly known as: SENOKOT-S Take 2 tablets by mouth at bedtime. What changed:  how much to take when to take this additional instructions   tamsulosin  0.4 MG Caps capsule Commonly known as: FLOMAX  Take 1 capsule (0.4 mg total) by mouth daily after supper.   valsartan  80 MG tablet Commonly known as: Diovan  Take 1.5 tablets (120 mg total) by mouth daily.  Give 120 mg by mouth in the morning for Hypertension Valsartan  120 mg QD        Contact information for follow-up providers     Charlanne Fredia CROME, MD Follow up in 1 week(s).   Specialty: Internal Medicine Contact information: [address] [place] [address] [phone]              Contact information for after-discharge care     Destination     [facility] .   Service: Skilled Nursing Contact information: [address] [place]  [address] [phone]                    Allergies[1]  Consultations: None   Procedures/Studies: US  RENAL Result Date: 07/08/2024 CLINICAL DATA:  Acute kidney injury. EXAM: RENAL / URINARY TRACT ULTRASOUND COMPLETE COMPARISON:  Abdomen and pelvis CT dated 07/07/2024. FINDINGS: Right Kidney: Renal measurements: 11.8 x 6.8 x 6.1 cm = volume: 256  mL. Echogenicity within normal limits. No mass or hydronephrosis visualized. Left Kidney: Renal measurements: 9.6 x 5.2 x 5.1 cm = volume: 132 mL. Diffuse cortical thinning. Echogenicity within normal limits. No mass or hydronephrosis visualized. Bladder: Appears normal for degree of bladder distention. Other: The examination was limited by patient body habitus and lack of cooperation. IMPRESSION: 1. No acute abnormality. 2. Left renal atrophy. Electronically Signed   By: Elspeth Bathe M.D.   On: 07/08/2024 15:18   CT ABDOMEN PELVIS WO CONTRAST Result Date: 07/07/2024 EXAM: CT ABDOMEN AND PELVIS WITHOUT CONTRAST 07/07/2024 08:18:20 PM TECHNIQUE: CT of the abdomen and pelvis was performed without the administration of intravenous contrast. Multiplanar reformatted images are provided for review. Automated exposure control, iterative reconstruction, and/or weight-based adjustment of the mA/kV was utilized to reduce the radiation dose to as low as reasonably achievable. COMPARISON: None available. CLINICAL HISTORY: Abdominal pain, acute, nonlocalized; AKI, poor historian, eval for obstruction. FINDINGS: LOWER CHEST: Calcified bibasilar granulomas with calcified lymph node noted on the right interlobular region. Aortic valve leaflet calcification, mitral annular calcification, coronary artery calcification. LIVER: A fluid density lesion within the liver likely represents a simple hepatic cyst versus hemangioma. Subcentimeter hypodensities within the left hepatic lobe are too small to characterize (3.16). The liver is otherwise unremarkable. GALLBLADDER AND BILE DUCTS: Gallbladder is unremarkable. No biliary ductal dilatation. SPLEEN: Punctate calcifications throughout the spleen are consistent with the sequelae of prior granulomatous disease, as seen within the lungs. PANCREAS: Diffusely atrophic pancreas. No focal lesion. Otherwise normal pancreatic contour. No surrounding inflammatory changes. No main pancreatic  ductal dilatation. ADRENAL GLANDS: No acute abnormality. KIDNEYS, URETERS AND BLADDER: Atrophic left kidney with renal cortical scarring. Fluid density lesion within the left kidney likely represents a simple renal cyst. Simple renal cysts do not require additional follow-up unless clinically indicated due to signs/symptoms. No stones in the kidneys or ureters. No hydronephrosis. No perinephric or periureteral stranding. Urinary bladder is unremarkable. GI AND BOWEL: Stomach demonstrates no acute abnormality. No small or large bowel thickening or dilatation. The appendix is unremarkable. Colonic diverticulosis. PERITONEUM AND RETROPERITONEUM: No ascites. No free air. VASCULATURE: Severe atherosclerotic  plaque of the aorta and its main branches. LYMPH NODES: No lymphadenopathy. REPRODUCTIVE ORGANS: No acute abnormality. BONES AND SOFT TISSUES: Total right hip arthroplasty partially visualized. Multilevel mild-to-moderate degenerative change of the spine. Chronic L1 compression fracture. Density sclerotic lesion of the T12 level, likely a bone island. No focal soft tissue abnormality. IMPRESSION: 1. No acute findings in the abdomen or pelvis with limited evaluation on this noncontrast study. 2. Severe atherosclerotic plaque of the aorta and its main branches. 3. Sequelae of prior granulomatous disease. 4. Other, non-acute and/or normal findings as above. Electronically signed by: Morgane Naveau MD 07/07/2024 08:29 PM EST RP Workstation: [id number]   CT Head Wo Contrast Result Date: 07/07/2024 EXAM: CT HEAD WITHOUT CONTRAST 07/07/2024 07:08:00 PM TECHNIQUE: CT of the head was performed without the administration of intravenous contrast. Automated exposure control, iterative reconstruction, and/or weight based adjustment of the mA/kV was utilized to reduce the radiation dose to as low as reasonably achievable. COMPARISON: CT head 03/01/2023 CLINICAL HISTORY: Mental status change, unknown cause FINDINGS: BRAIN AND  VENTRICLES: No acute hemorrhage. No evidence of acute infarct. No hydrocephalus. No extra-axial collection. No mass effect or midline shift. Patchy white matter hypoattenuations, compatible with chronic and vascular ischemic change. ORBITS: No acute abnormality. SINUSES: No acute abnormality. SOFT TISSUES AND SKULL: No acute soft tissue abnormality. No skull fracture. IMPRESSION: 1. No acute intracranial abnormality. Electronically signed by: Gilmore Molt MD 07/07/2024 07:16 PM EST RP Workstation: [id number]   DG Chest Portable 1 View Result Date: 07/07/2024 CLINICAL DATA:  Altered level of consciousness EXAM: PORTABLE CHEST 1 VIEW COMPARISON:  09/28/2023 FINDINGS: Single frontal view of the chest demonstrates an unremarkable cardiac silhouette. No acute airspace disease, effusion, or pneumothorax. No acute bony abnormalities. IMPRESSION: 1. No acute intrathoracic process. Electronically Signed   By: Ozell Daring M.D.   On: 07/07/2024 19:05     Discharge Exam: Vitals:   07/10/24 0903 07/10/24 1150  BP: (!) 188/67 (!) 177/52  Pulse: 75 65  Resp: 20 16  Temp: 98 F (36.7 C) 98.5 F (36.9 C)  SpO2: 90% 91%   Vitals:   07/10/24 0043 07/10/24 0515 07/10/24 0903 07/10/24 1150  BP: (!) 167/50 (!) 190/65 (!) 188/67 (!) 177/52  Pulse: 70 69 75 65  Resp: 17 18 20 16   Temp: 97.7 F (36.5 C) 98.3 F (36.8 C) 98 F (36.7 C) 98.5 F (36.9 C)  TempSrc: Oral     SpO2: 90% 92% 90% 91%  Weight:      Height:        General: Pt is alert, awake, not in acute distress Cardiovascular: RRR, S1/S2 +, no rubs, no gallops Respiratory: CTA bilaterally, no wheezing, no rhonchi Abdominal: Soft, NT, ND, bowel sounds + Extremities: no edema, no cyanosis    The results of significant diagnostics from this hospitalization (including imaging, microbiology, ancillary and laboratory) are listed below for reference.     Microbiology: Recent Results (from the past 240 hours)  Resp panel by RT-PCR (RSV,  Flu A&B, Covid) Anterior Nasal Swab     Status: None   Collection Time: 07/07/24  8:06 PM   Specimen: Anterior Nasal Swab  Result Value Ref Range Status   SARS Coronavirus 2 by RT PCR NEGATIVE NEGATIVE Final   Influenza A by PCR NEGATIVE NEGATIVE Final   Influenza B by PCR NEGATIVE NEGATIVE Final    Comment: (NOTE) The Xpert Xpress SARS-CoV-2/FLU/RSV plus assay is intended as an aid in the diagnosis of influenza from Nasopharyngeal swab  specimens and should not be used as a sole basis for treatment. Nasal washings and aspirates are unacceptable for Xpert Xpress SARS-CoV-2/FLU/RSV testing.  Fact Sheet for Patients: bloggercourse.com  Fact Sheet for Healthcare Providers: seriousbroker.it  This test is not yet approved or cleared by the [place]  FDA and has been authorized for detection and/or diagnosis of SARS-CoV-2 by FDA under an Emergency Use Authorization (EUA). This EUA will remain in effect (meaning this test can be used) for the duration of the COVID-19 declaration under Section 564(b)(1) of the Act, 21 U.S.C. section 360bbb-3(b)(1), unless the authorization is terminated or revoked.     Resp Syncytial Virus by PCR NEGATIVE NEGATIVE Final    Comment: (NOTE) Fact Sheet for Patients: bloggercourse.com  Fact Sheet for Healthcare Providers: seriousbroker.it  This test is not yet approved or cleared by the [place]  FDA and has been authorized for detection and/or diagnosis of SARS-CoV-2 by FDA under an Emergency Use Authorization (EUA). This EUA will remain in effect (meaning this test can be used) for the duration of the COVID-19 declaration under Section 564(b)(1) of the Act, 21 U.S.C. section 360bbb-3(b)(1), unless the authorization is terminated or revoked.  Performed at [facility], [address] [place], [place] [address]   Urine Culture (for  pregnant, neutropenic or urologic patients or patients with an indwelling urinary catheter)     Status: None   Collection Time: 07/07/24 11:38 PM   Specimen: Urine, Clean Catch  Result Value Ref Range Status   Specimen Description URINE, CLEAN CATCH  Final   Special Requests NONE  Final   Culture   Final    NO GROWTH Performed at [facility], [address] [place], [place] [address]    Report Status 07/09/2024 FINAL  Final     Labs: BNP (last 3 results) Recent Labs    08/24/23 1236 09/25/23 2212  BNP 266.9* 488.2*   Basic Metabolic Panel: Recent Labs  Lab 07/07/24 1836 07/08/24 0347 07/09/24 0333 07/10/24 0432  NA 138 139 144 146*  K 5.3* 4.3 4.0 3.7  CL 108 108 113* 114*  CO2 22 14* 23 23  GLUCOSE 195* 154* 168* 139*  BUN 77* 73* 60* 38*  CREATININE 3.18* 2.72* 2.62* 1.58*  CALCIUM  8.6* 8.3* 8.3* 8.5*   Liver Function Tests: Recent Labs  Lab 07/07/24 1836 07/08/24 0347 07/09/24 0333 07/10/24 0432  AST 84* 55* 47* 35  ALT 62* 56* 57* 52*  ALKPHOS 141* 121 121 117  BILITOT 1.6* 0.6 0.5 0.4  PROT 7.0 6.2* 6.2* 6.7  ALBUMIN 2.7* 2.2* 2.2* 2.3*   No results for input(s): LIPASE, AMYLASE in the last 168 hours. Recent Labs  Lab 07/07/24 2006  AMMONIA 23   CBC: Recent Labs  Lab 07/07/24 1836 07/08/24 0347 07/09/24 0333  WBC 7.7 6.2 7.9  NEUTROABS 5.4  --   --   HGB 11.1* 9.7* 10.0*  HCT 34.2* 29.5* 31.2*  MCV 90.5 91.0 90.4  PLT 237 186 228   Cardiac Enzymes: No results for input(s): CKTOTAL, CKMB, CKMBINDEX, TROPONINI in the last 168 hours. BNP: Invalid input(s): POCBNP CBG: No results for input(s): GLUCAP in the last 168 hours. D-Dimer No results for input(s): DDIMER in the last 72 hours. Hgb A1c No results for input(s): HGBA1C in the last 72 hours. Lipid Profile No results for input(s): CHOL, HDL, LDLCALC, TRIG, CHOLHDL, LDLDIRECT in the last 72 hours. Thyroid  function studies No results for  input(s): TSH, T4TOTAL, T3FREE, THYROIDAB  in the last 72 hours.  Invalid input(s): FREET3 Anemia work up No results for input(s): VITAMINB12, FOLATE, FERRITIN, TIBC, IRON , RETICCTPCT in the last 72 hours. Urinalysis    Component Value Date/Time   COLORURINE YELLOW 07/07/2024 2041   APPEARANCEUR TURBID (A) 07/07/2024 2041   LABSPEC 1.010 07/07/2024 2041   PHURINE 5.0 07/07/2024 2041   GLUCOSEU NEGATIVE 07/07/2024 2041   HGBUR NEGATIVE 07/07/2024 2041   BILIRUBINUR NEGATIVE 07/07/2024 2041   KETONESUR NEGATIVE 07/07/2024 2041   PROTEINUR 30 (A) 07/07/2024 2041   UROBILINOGEN 0.2 10/28/2010 0909   NITRITE NEGATIVE 07/07/2024 2041   LEUKOCYTESUR LARGE (A) 07/07/2024 2041   Sepsis Labs Recent Labs  Lab 07/07/24 1836 07/08/24 0347 07/09/24 0333  WBC 7.7 6.2 7.9   Microbiology Recent Results (from the past 240 hours)  Resp panel by RT-PCR (RSV, Flu A&B, Covid) Anterior Nasal Swab     Status: None   Collection Time: 07/07/24  8:06 PM   Specimen: Anterior Nasal Swab  Result Value Ref Range Status   SARS Coronavirus 2 by RT PCR NEGATIVE NEGATIVE Final   Influenza A by PCR NEGATIVE NEGATIVE Final   Influenza B by PCR NEGATIVE NEGATIVE Final    Comment: (NOTE) The Xpert Xpress SARS-CoV-2/FLU/RSV plus assay is intended as an aid in the diagnosis of influenza from Nasopharyngeal swab specimens and should not be used as a sole basis for treatment. Nasal washings and aspirates are unacceptable for Xpert Xpress SARS-CoV-2/FLU/RSV testing.  Fact Sheet for Patients: bloggercourse.com  Fact Sheet for Healthcare Providers: seriousbroker.it  This test is not yet approved or cleared by the [place]  FDA and has been authorized for detection and/or diagnosis of SARS-CoV-2 by FDA under an Emergency Use Authorization (EUA). This EUA will remain in effect (meaning this test can be used) for the duration of  the COVID-19 declaration under Section 564(b)(1) of the Act, 21 U.S.C. section 360bbb-3(b)(1), unless the authorization is terminated or revoked.     Resp Syncytial Virus by PCR NEGATIVE NEGATIVE Final    Comment: (NOTE) Fact Sheet for Patients: bloggercourse.com  Fact Sheet for Healthcare Providers: seriousbroker.it  This test is not yet approved or cleared by the [place]  FDA and has been authorized for detection and/or diagnosis of SARS-CoV-2 by FDA under an Emergency Use Authorization (EUA). This EUA will remain in effect (meaning this test can be used) for the duration of the COVID-19 declaration under Section 564(b)(1) of the Act, 21 U.S.C. section 360bbb-3(b)(1), unless the authorization is terminated or revoked.  Performed at [facility], [address] [place], [place] [address]   Urine Culture (for pregnant, neutropenic or urologic patients or patients with an indwelling urinary catheter)     Status: None   Collection Time: 07/07/24 11:38 PM   Specimen: Urine, Clean Catch  Result Value Ref Range Status   Specimen Description URINE, CLEAN CATCH  Final   Special Requests NONE  Final   Culture   Final    NO GROWTH Performed at [facility], [address] [place], [place] [address]    Report Status 07/09/2024 FINAL  Final    FURTHER DISCHARGE INSTRUCTIONS:   Get Medicines reviewed and adjusted: Please take all your medications with you for your next visit with your Primary MD   Laboratory/radiological data: Please request your Primary MD to go over all hospital tests and procedure/radiological results at the follow up, please ask your Primary MD to get all Hospital records sent to his/her office.  In some cases, they will be blood work, cultures and biopsy results pending at the time of your discharge. Please request that your primary care M.D. goes through all the records of your hospital data  and follows up on these results.   Also Note the following: If you experience worsening of your admission symptoms, develop shortness of breath, life threatening emergency, suicidal or homicidal thoughts you must seek medical attention immediately by calling 911 or calling your MD immediately  if symptoms less severe.   You must read complete instructions/literature along with all the possible adverse reactions/side effects for all the Medicines you take and that have been prescribed to you. Take any new Medicines after you have completely understood and accpet all the possible adverse reactions/side effects.    patient was instructed, not to drive, operate heavy machinery, perform activities at heights, swimming or participation in water  activities or provide baby-sitting services while on Pain, Sleep and Anxiety Medications; until their outpatient Physician has advised to do so again. Also recommended to not to take more than prescribed Pain, Sleep and Anxiety Medications.  It is not advisable to combine anxiety, sleep and pain medications without talking with your primary care provider.     Wear Seat belts while driving.   Please note: You were cared for by a hospitalist during your hospital stay. Once you are discharged, your primary care physician will handle any further medical issues. Please note that NO REFILLS for any discharge medications will be authorized once you are discharged, as it is imperative that you return to your primary care physician (or establish a relationship with a primary care physician if you do not have one) for your post hospital discharge needs so that they can reassess your need for medications and monitor your lab values  Time coordinating discharge: Over 30 minutes  SIGNED:   Fredia Skeeter, MD  [facility] 07/10/2024, 12:17 PM *Please note that this is a verbal dictation therefore any spelling or grammatical errors are due to the Dragon Medical One  system interpretation. If 7PM-7AM, please contact night-coverage [email or web address]     [1]  Allergies Allergen Reactions   Lisinopril Cough   Niaspan [Niacin Er (Antihyperlipidemic)] Rash

## 2024-07-10 NOTE — [place] FL2 (Signed)
 [place]  MEDICAID FL2 LEVEL OF CARE FORM     IDENTIFICATION  Patient Name: Nathan Santiago Birthdate: 25-Sep-1930 Sex: male Admission Date (Current Location): 07/07/2024  [facility] and [place] Number:  Producer, Television/film/video and Address:  [facility], [address], [place], [place] [address]      Provider Number: [id number]  Attending Physician Name and Address:  Vernon Ranks, MD  Relative Name and Phone Number:  Duvall Comes: [phone]    Current Level of Care: Hospital Recommended Level of Care: Skilled Nursing Facility Prior Approval Number:    Date Approved/Denied:   PASRR Number: [id number]  Discharge Plan: SNF    Current Diagnoses: Patient Active Problem List   Diagnosis Date Noted   UTI (urinary tract infection) 07/07/2024   BPH (benign prostatic hyperplasia) 06/20/2024   Stasis dermatitis 03/18/2024   Diastolic CHF (HCC) 03/18/2024   Gout attack 03/18/2024   Type 2 diabetes mellitus with diabetic chronic kidney disease (HCC) 02/08/2024   Renal artery stenosis 11/21/2023   A-fib (HCC) 09/28/2023   Memory loss 11/16/2022   Hyperlipidemia 07/28/2022   Chronic ischemic heart disease 07/28/2022   Iron  deficiency anemia 07/14/2022   Neuropathy 05/13/2022   Chronic cough 04/15/2018   Gait abnormality 02/13/2017   Claudication 04/24/2016   Sensorineural hearing loss (SNHL), bilateral 03/09/2016   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   Carotid artery disease 08/07/2014   Peripheral arterial disease 08/06/2013   GASTROPARESIS 02/25/2009   History of colonic polyps 02/22/2009   Essential hypertension 09/26/2007   Allergic rhinitis 09/26/2007   GERD 09/26/2007   Peptic ulcer 09/26/2007   UMBILICAL HERNIA 09/26/2007   NEPHROLITHIASIS 09/26/2007    Orientation RESPIRATION BLADDER Height & Weight     Self  Normal Incontinent Weight: 180 lb (81.6 kg) Height:  5' 4 (162.6 cm)  BEHAVIORAL SYMPTOMS/MOOD NEUROLOGICAL  BOWEL NUTRITION STATUS      Incontinent Diet (Please refer to dc summary)  AMBULATORY STATUS COMMUNICATION OF NEEDS Skin   Limited Assist Verbally Normal                       Personal Care Assistance Level of Assistance  Bathing, Feeding, Dressing Bathing Assistance: Limited assistance Feeding assistance: Limited assistance Dressing Assistance: Limited assistance     Functional Limitations Info  Sight, Hearing, Speech Sight Info: Adequate Hearing Info: Adequate Speech Info: Adequate    SPECIAL CARE FACTORS FREQUENCY  PT (By licensed PT), OT (By licensed OT)     PT Frequency: 5x/week OT Frequency: 5x/week            Contractures Contractures Info: Not present    Additional Factors Info  Code Status, Allergies Code Status Info: Full Allergies Info: Lisinopril, Niaspan           Current Medications (07/10/2024):  This is the current hospital active medication list Current Facility-Administered Medications  Medication Dose Route Frequency Provider Last Rate Last Admin   acetaminophen  (TYLENOL ) tablet 650 mg  650 mg Oral Q6H PRN Debby Camila LABOR, MD       Or   acetaminophen  (TYLENOL ) suppository 650 mg  650 mg Rectal Q6H PRN Debby Camila LABOR, MD       acetaminophen  (TYLENOL ) tablet 1,000 mg  1,000 mg Oral BID Thomas, Sara-Maiz A, MD   1,000 mg at 07/09/24 2256   albuterol  (PROVENTIL ) (2.5 MG/3ML) 0.083% nebulizer solution 2.5 mg  2.5 mg Nebulization Q2H PRN Debby Camila LABOR, MD  allopurinol  (ZYLOPRIM ) tablet 100 mg  100 mg Oral Daily Debby Hitch A, MD   100 mg at 07/09/24 1046   amLODipine  (NORVASC ) tablet 10 mg  10 mg Oral Daily Thomas, Sara-Maiz A, MD   10 mg at 07/09/24 1046   apixaban  (ELIQUIS ) tablet 2.5 mg  2.5 mg Oral BID Lama, Gagan S, MD   2.5 mg at 07/09/24 2256   atorvastatin  (LIPITOR) tablet 40 mg  40 mg Oral q1800 Thomas, Sara-Maiz A, MD   40 mg at 07/09/24 1711   cefTRIAXone  (ROCEPHIN ) 2 g in sodium chloride  0.9 % 100 mL IVPB  2  g Intravenous Q24H Debby Hitch LABOR, MD   Stopped at 07/09/24 2337   cloNIDine  (CATAPRES ) tablet 0.1 mg  0.1 mg Oral TID Debby Hitch LABOR, MD   0.1 mg at 07/09/24 2256   finasteride  (PROSCAR ) tablet 5 mg  5 mg Oral Daily Debby Hitch A, MD   5 mg at 07/09/24 1045   hydrALAZINE  (APRESOLINE ) injection 10 mg  10 mg Intravenous Q4H PRN Howerter, Justin B, DO   10 mg at 07/10/24 0606   hydrALAZINE  (APRESOLINE ) tablet 50 mg  50 mg Oral QID Thomas, Sara-Maiz A, MD   50 mg at 07/09/24 2256   iron  polysaccharides (NIFEREX) capsule 150 mg  150 mg Oral Q M,W,F Debby Hitch A, MD   150 mg at 07/09/24 1046   metoprolol  tartrate (LOPRESSOR ) tablet 75 mg  75 mg Oral BID Thomas, Sara-Maiz A, MD   75 mg at 07/09/24 2255   ondansetron  (ZOFRAN ) tablet 4 mg  4 mg Oral Q6H PRN Debby Hitch LABOR, MD       Or   ondansetron  (ZOFRAN ) injection 4 mg  4 mg Intravenous Q6H PRN Debby Hitch LABOR, MD       pantoprazole  (PROTONIX ) EC tablet 40 mg  40 mg Oral BID Thomas, Sara-Maiz A, MD   40 mg at 07/09/24 2256   polyethylene glycol (MIRALAX  / GLYCOLAX ) packet 17 g  17 g Oral Daily Debby Hitch A, MD   17 g at 07/08/24 1030   senna-docusate (Senokot-S) tablet 1 tablet  1 tablet Oral QODAY Thomas, Sara-Maiz A, MD   1 tablet at 07/08/24 2220     Discharge Medications: Please see discharge summary for a list of discharge medications.  Relevant Imaging Results:  Relevant Lab Results:   Additional Information SS# [id number]  Sherline Clack, LCSWA

## 2024-07-10 NOTE — Progress Notes (Signed)
 [facility] night cross cover note:   I was notified by the patient's RN that the patient's elevated blood pressure this morning, with systolic blood pressures in the 190s and corresponding heart rates in the 60s.  No new complaints reported at this time.  I subsequently added prn IV hydralazine  for systolic blood pressure greater than 180 mmHg.      Eva Pore, DO Hospitalist

## 2024-07-10 NOTE — Care Management Important Message (Signed)
 Important Message  Patient Details  Name: Nathan Santiago MRN: [id number] Date of Birth: 1930/11/06   Important Message Given:  Yes - Medicare IM  Patient left prior to IM delivery will mail a copy to the patient home address.    Teira Arcilla 07/10/2024, 2:48 PM

## 2024-07-10 NOTE — Progress Notes (Addendum)
 Location:  Medical Illustrator of Service:  SNF (31) Provider:   Bari America, ANP [facility] [phone]   Charlanne Fredia CROME, MD  Patient Care Team: Charlanne Fredia CROME, MD as PCP - General (Internal Medicine) Court Dorn PARAS, MD as PCP - Cardiology (Cardiology)  Extended Emergency Contact Information Primary Emergency Contact: Christyne Madeline LABOR Address: [address]          [place], [place] [address] [place] Home Phone: [phone] Mobile Phone: [phone] Relation: Spouse  Code Status:  DNR Goals of care: Advanced Directive information    07/07/2024    6:38 PM  Advanced Directives  Does Patient Have a Medical Advance Directive? Yes  Type of Advance Directive Out of facility DNR (pink MOST or yellow form)  Does patient want to make changes to medical advance directive? Yes (ED - Information included in AVS)  Pre-existing out of facility DNR order (yellow form or pink MOST form) Pink Most/Yellow Form available - Physician notified to receive inpatient order     Chief Complaint  Patient presents with   Follow-up    HPI:  He is a 89 year old with hypertension, type 2 diabetes, and atrial fibrillation who presents for follow-up after hospitalization for acute encephalopathy and acute kidney injury.  Acute encephalopathy and sepsis - Hospitalized from December 8th to December 11th, 2025 for acute encephalopathy and suspected sepsis, likely secondary to urinary tract infection. - Found lethargic and weak at skilled care facility. - CT head negative for acute findings. - Mentation improved during hospitalization with IV fluids and antibiotics. - Initial urinalysis showed bacteria; final urine culture had no growth.  Acute kidney injury - Creatinine on admission was 3.18, improved to 1.5 after IV fluids and holding Lasix  and valsartan . - Mildly elevated liver function tests improved with hydration. - Lasix  and  valsartan  remain held after AKI.  CT of the abd and pelvis 07/07/2024 with no acute findigns, severe atherosclerotic plaque noted of the aorta and prior granulomatous disease.   Hypertension - Difficult to control blood pressure. Left renal artery stenosis  - Currently taking amlodipine , clonidine , and hydralazine .  Hypoxemia and pulmonary status - Oxygen saturation was 86 on return from hospital here at [facility]. improved with 2 L oxygen. - Chest x-ray on December 8th showed no acute findings.   Type 2 diabetes mellitus - Managed with diet. - Hemoglobin A1c was 7.1% on November 24th.  Gout - Uric acid 7.7. - Takes allopurinol .  Anemia - Chronic disease and iron  deficiency anemia. - Hemoglobin was 10.0 on December 10th. - Takes iron  three times a week.  Chronic cough - Treated with Protonix .  Constipation - Treated with Miralax  and Senokot. - Had a bowel movement today.  Lower urinary tract symptoms and bph - History of urinary retention due to BPH. - Able to void independently during hospital stay. - Continues on Flomax .  Hyperlipidemia - lipitor reduced udring his hospitalization due to prior elevated LFTs. - LDL was 59 on July 14th.  Peripheral arterial disease and coronary artery disease - Managed conservatively.On statin and eliquis   Hx of AAA  Atrial fibrillation and stroke prevention - Atrial fibrillation controlled with Lopressor . - Takes Eliquis  for stroke prevention. Past Medical History:  Diagnosis Date   AAA (abdominal aortic aneurysm)    asymptomatic   AAA (abdominal aortic aneurysm) 2010   peripheral  angiogram-- bilateral SFA DISEASE  and 60 to 70% infrarenaal abd. aortic stenosis with 15 -mm  gradient   Adenomatous colon polyp 11/2003   CAD (coronary artery disease)    Gait abnormality 02/13/2017   Gastroparesis    pt denies   GERD (gastroesophageal reflux disease)    w/ LPR   History of kidney stones    x2   Hyperlipidemia     Hypertension    IDA (iron  deficiency anemia)    Peripheral arterial disease    RCEA  by Dr Medford Brunswick   PUD (peptic ulcer disease)    pt unaware   Vertigo    Past Surgical History:  Procedure Laterality Date   ABDOMINAL AORTOGRAM N/A 11/19/2023   Procedure: ABDOMINAL AORTOGRAM;  Surgeon: Sheree Penne Bruckner, MD;  Location: [facility] INVASIVE CV LAB;  Service: Cardiovascular;  Laterality: N/A;   ABDOMINAL AORTOGRAM W/LOWER EXTREMITY Bilateral 08/05/2018   Procedure: ABDOMINAL AORTOGRAM W/LOWER EXTREMITY;  Surgeon: Court Dorn PARAS, MD;  Location: [facility] INVASIVE CV LAB;  Service: Cardiovascular;  Laterality: Bilateral;   ABDOMINAL AORTOGRAM W/LOWER EXTREMITY N/A 09/26/2021   Procedure: ABDOMINAL AORTOGRAM W/LOWER EXTREMITY;  Surgeon: Court Dorn PARAS, MD;  Location: [facility] INVASIVE CV LAB;  Service: Cardiovascular;  Laterality: N/A;   AORTOGRAM  04/24/2016    Abdominal aortogram, bilateral iliac angiogram, bifemoral runoff   CARDIAC CATHETERIZATION  05/12/2005   RCA   carotid doppler  01/24/2013   RICA endarterectomy,left CCA 0-49%; left bulb and prox ICA 50-69%; bilaateral subclavian < 50%   CAROTID ENDARTERECTOMY  11/01/2010   CATARACT EXTRACTION     COLONOSCOPY     CORONARY ANGIOPLASTY  05/18/2005   2 STENTS distal RCA AND PROXIMAL-MID RCA   5 total stents per pt.   CYSTOSCOPY/URETEROSCOPY/HOLMIUM LASER/STENT PLACEMENT Left 01/11/2018   Procedure: LEFT URETEROSCOPY/HOLMIUM LASER/STENT PLACEMENT;  Surgeon: Cam Morene ORN, MD;  Location: [facility] ORS;  Service: Urology;  Laterality: Left;   DOPPLER ECHOCARDIOGRAPHY  02/07/2012   EF 55%,SHOWED NO ISCHEMIA    HERNIA REPAIR     umbilical   lower arterial  doppler  02/04/2013   aotra 1.5 x 1.5 cm; distal abd aorta 70-99%,proximal common iliac arteries -very stenotic with increased velocities>50%,may be falsely elevated as a result of residual plaque from the distal aorta stenosis   LOWER EXTREMITY ANGIOGRAPHY N/A 11/19/2023   Procedure: Lower  Extremity Angiography;  Surgeon: Sheree Penne Bruckner, MD;  Location: [facility] INVASIVE CV LAB;  Service: Cardiovascular;  Laterality: N/A;   lower extremity doppler  June 18 ,2013   ABI'S ABNORMAL, RABI was 0.88 and LABI 0.75 ,with 3-vessel  run off   NM MYOVIEW  LTD  MAY 23,2011   showed no significant ischemia;   NM MYOVIEW  LTD  04/22/2008   ef 77%,exercise capcity 6 METS ,exaggerated blood pressure response to exercise   PERIPHERAL VASCULAR CATHETERIZATION N/A 04/24/2016   Procedure: Lower Extremity Angiography;  Surgeon: Dorn PARAS Court, MD;  Location: [facility] INVASIVE CV LAB;  Service: Cardiovascular;  Laterality: N/A;   PERIPHERAL VASCULAR CATHETERIZATION  04/24/2016   Procedure: Peripheral Vascular Intervention;  Surgeon: Dorn PARAS Court, MD;  Location: [facility] INVASIVE CV LAB;  Service: Cardiovascular;;  Aorta   retrograde central aortic catheterization  05/19/2005   cutting balloon atherectomy, c-circ stenosis with DES STENTING CYPHER   TONSILLECTOMY     TOTAL HIP ARTHROPLASTY Right 07/11/2022   Procedure: TOTAL HIP ARTHROPLASTY ANTERIOR APPROACH;  Surgeon: Ernie Cough, MD;  Location: [facility] ORS;  Service: Orthopedics;  Laterality: Right;   TOTAL KNEE ARTHROPLASTY Left 01/03/2019   Procedure: TOTAL KNEE ARTHROPLASTY;  Surgeon: Kay Kemps, MD;  Location:  [facility] ORS;  Service: Orthopedics;  Laterality: Left;  with IS block   TRANSURETHRAL RESECTION OF PROSTATE N/A 09/08/2016   Procedure: TRANSURETHRAL RESECTION OF THE PROSTATE (TURP);  Surgeon: Morene LELON Salines, MD;  Location: [facility] ORS;  Service: Urology;  Laterality: N/A;   TRANSURETHRAL RESECTION OF PROSTATE     10-10-17  Dr. Salines   TRANSURETHRAL RESECTION OF PROSTATE N/A 10/10/2017   Procedure: TRANSURETHRAL RESECTION OF THE PROSTATE (TURP);  Surgeon: Salines Morene LELON, MD;  Location: [facility] ORS;  Service: Urology;  Laterality: N/A;   VASECTOMY      Allergies[1]  Outpatient Encounter Medications as of 07/10/2024  Medication Sig   acetaminophen   (TYLENOL ) 500 MG tablet Take 1,000 mg by mouth every 12 (twelve) hours as needed for mild pain (pain score 1-3).   allopurinol  (ZYLOPRIM ) 100 MG tablet Take 1 tablet (100 mg total) by mouth daily.   amLODipine  (NORVASC ) 10 MG tablet Take 10 mg by mouth in the morning. Hold if BP <95   apixaban  (ELIQUIS ) 2.5 MG TABS tablet Take 1 tablet (2.5 mg total) by mouth 2 (two) times daily.   Apoaequorin 10 MG CAPS Take 10 mg by mouth in the morning.   atorvastatin  (LIPITOR) 20 MG tablet Take 1 tablet (20 mg total) by mouth daily.   Biotin  10 MG TABS Take 10 mg by mouth in the morning. Once a morning 8am-11am   cetirizine  (ZYRTEC ) 10 MG tablet Take 1 tablet (10 mg total) by mouth at bedtime.   cholecalciferol  (VITAMIN D3) 25 MCG (1000 UT) tablet Take 1,000 Units by mouth in the morning.   cloNIDine  (CATAPRES ) 0.1 MG tablet Take 1 tablet (0.1 mg total) by mouth 3 (three) times daily. Hold if BP < 95   finasteride  (PROSCAR ) 5 MG tablet Take 5 mg by mouth in the morning.   furosemide  (LASIX ) 20 MG tablet Take 20 mg by mouth daily as needed (HOLD UNTIL 01/28/2024).   furosemide  (LASIX ) 40 MG tablet Take 1 tablet (40 mg total) by mouth daily. HOLD UNTIL 01/28/2024   gabapentin  (NEURONTIN ) 100 MG capsule Take 200 mg by mouth 3 (three) times daily.   Glucosamine-Chondroitin 750-600 MG TABS Take 1 tablet by mouth 2 (two) times daily.   hydrALAZINE  (APRESOLINE ) 50 MG tablet Take 50 mg by mouth 4 (four) times daily. Hold if BP <95   iron  polysaccharides (NIFEREX) 150 MG capsule Take 150 mg by mouth every Monday, Wednesday, and Friday.   melatonin 5 MG TABS Take 1 tablet (5 mg total) by mouth at bedtime.   metoprolol  tartrate (LOPRESSOR ) 50 MG tablet Take 75 mg by mouth 2 (two) times daily.   Multiple Vitamin (MULTIVITAMIN WITH MINERALS) TABS tablet Take 1 tablet by mouth in the morning.   pantoprazole  (PROTONIX ) 40 MG tablet Take 40 mg by mouth 2 (two) times daily.   polyethylene glycol (MIRALAX ) 17 g packet Take 17  g by mouth daily.   potassium chloride  SA (K-DUR,KLOR-CON ) 20 MEQ tablet Take 20 mEq by mouth every evening.    sennosides-docusate sodium  (SENOKOT-S) 8.6-50 MG tablet Take 2 tablets by mouth at bedtime. (Patient taking differently: Take 1 tablet by mouth every other day. Every other evening)   sodium fluoride (PREVIDENT) 1.1 % GEL dental gel Place 1 Application onto teeth at bedtime.   tamsulosin  (FLOMAX ) 0.4 MG CAPS capsule Take 1 capsule (0.4 mg total) by mouth daily after supper.   valsartan  (DIOVAN ) 80 MG tablet Take 1.5 tablets (120 mg total) by mouth daily.  Give  120 mg by mouth in the morning for Hypertension Valsartan  120 mg QD   Facility-Administered Encounter Medications as of 07/10/2024  Medication   acetaminophen  (TYLENOL ) tablet 650 mg   Or   acetaminophen  (TYLENOL ) suppository 650 mg   acetaminophen  (TYLENOL ) tablet 1,000 mg   albuterol  (PROVENTIL ) (2.5 MG/3ML) 0.083% nebulizer solution 2.5 mg   allopurinol  (ZYLOPRIM ) tablet 100 mg   amLODipine  (NORVASC ) tablet 10 mg   apixaban  (ELIQUIS ) tablet 2.5 mg   atorvastatin  (LIPITOR) tablet 40 mg   cefTRIAXone  (ROCEPHIN ) 2 g in sodium chloride  0.9 % 100 mL IVPB   cloNIDine  (CATAPRES ) tablet 0.1 mg   finasteride  (PROSCAR ) tablet 5 mg   hydrALAZINE  (APRESOLINE ) injection 10 mg   hydrALAZINE  (APRESOLINE ) tablet 50 mg   iron  polysaccharides (NIFEREX) capsule 150 mg   metoprolol  tartrate (LOPRESSOR ) tablet 75 mg   ondansetron  (ZOFRAN ) tablet 4 mg   Or   ondansetron  (ZOFRAN ) injection 4 mg   pantoprazole  (PROTONIX ) EC tablet 40 mg   polyethylene glycol (MIRALAX  / GLYCOLAX ) packet 17 g   senna-docusate (Senokot-S) tablet 1 tablet    Review of Systems  Constitutional:  Positive for activity change and fatigue. Negative for appetite change, chills, diaphoresis and fever.  HENT:  Negative for congestion.   Respiratory:  Positive for cough. Negative for shortness of breath and wheezing.   Cardiovascular:  Positive for leg swelling.  Negative for chest pain.  Gastrointestinal:  Negative for abdominal distention, abdominal pain, constipation, diarrhea, nausea and vomiting.  Genitourinary:  Negative for difficulty urinating, dysuria and urgency.  Musculoskeletal:  Positive for gait problem. Negative for back pain, myalgias and neck pain.  Skin:  Negative for rash.  Neurological:  Negative for dizziness and weakness.  Psychiatric/Behavioral:  Positive for confusion. Negative for agitation and behavioral problems.     Immunization History  Administered Date(s) Administered    sv, Bivalent, Protein Subunit Rsvpref,pf (Abrysvo) 07/19/2022   Fluad  Quad(high Dose 65+) 05/02/2022, 05/22/2023   INFLUENZA, HIGH DOSE SEASONAL PF 03/31/2017, 03/29/2018   Influenza,inj,Quad PF,6+ Mos 04/25/2016, 05/06/2018   Influenza,inj,quad, With Preservative 05/26/2019   Influenza-Unspecified 05/14/2001, 05/31/2001, 05/14/2002, 05/15/2002, 05/18/2003, 05/25/2004, 04/30/2005, 05/29/2006, 05/01/2007, 05/11/2008, 05/31/2009, 05/31/2010, 04/30/2013, 05/31/2014, 03/31/2021   Moderna Covid-19 Vaccine Bivalent Booster 42yrs & up 08/31/2021, 05/22/2023, 04/29/2024   Moderna SARS-COV2 Booster Vaccination 06/15/2020, 11/19/2020, 01/02/2022   Moderna Sars-Covid-2 Vaccination 08/12/2019, 09/10/2019, 05/31/2022   Pfizer Covid-19 Vaccine Bivalent Booster 13yrs & up 11/23/2022   Pneumococcal Polysaccharide-23 05/02/2000, 09/24/2018   Pneumococcal-Unspecified 01/12/2024   Td 09/08/2013   Tetanus 01/25/2024   Zoster Recombinant(Shingrix) 10/04/2017, 01/23/2018   Pertinent  Health Maintenance Due  Topic Date Due   Influenza Vaccine  02/29/2024   HEMOGLOBIN A1C  08/13/2024   OPHTHALMOLOGY EXAM  08/26/2024   FOOT EXAM  02/27/2025      07/28/2022    9:00 AM 10/14/2022    7:21 AM 11/28/2022    2:50 PM 06/04/2023    4:59 PM 01/11/2024   11:18 AM  Fall Risk  Falls in the past year?  1 1 0 1  Was there an injury with Fall?  1  0  0  1   Fall Risk Category  Calculator  3 1 0 2  (RETIRED) Patient Fall Risk Level High fall risk       Patient at Risk for Falls Due to   History of fall(s);Impaired balance/gait;Impaired mobility No Fall Risks History of fall(s)  Fall risk Follow up  Falls evaluation completed Falls evaluation completed;Education provided;Falls prevention discussed  Falls evaluation completed Falls evaluation completed     Data saved with a previous flowsheet row definition   Functional Status Survey:    Vitals:   07/10/24 1517  BP: (!) 198/77  Pulse: 72  Resp: 18  Temp: 97.8 F (36.6 C)  SpO2: (!) 86%   There is no height or weight on file to calculate BMI. Physical Exam Vitals and nursing note reviewed.  Constitutional:      Appearance: Normal appearance.     Comments: Lethargic but arouses and can f/c  HENT:     Head: Normocephalic and atraumatic.     Nose: Nose normal.     Mouth/Throat:     Mouth: Mucous membranes are moist.     Pharynx: Oropharynx is clear.  Eyes:     General:        Right eye: Discharge present.        Left eye: Discharge present.    Conjunctiva/sclera: Conjunctivae normal.     Pupils: Pupils are equal, round, and reactive to light.     Comments: Erythema to lids  Cardiovascular:     Rate and Rhythm: Normal rate and regular rhythm.     Pulses:          Dorsalis pedis pulses are 0 on the right side and 2+ on the left side.     Heart sounds: No murmur heard. Pulmonary:     Effort: Pulmonary effort is normal. No respiratory distress.     Breath sounds: Normal breath sounds. No wheezing.     Comments: Decreased bases  Abdominal:     General: Bowel sounds are normal. There is no distension.     Palpations: Abdomen is soft.     Tenderness: There is no abdominal tenderness.  Musculoskeletal:     Cervical back: No rigidity.     Right lower leg: Edema (trace) present.     Left lower leg: Edema (trace) present.  Lymphadenopathy:     Cervical: No cervical adenopathy.  Skin:    General: Skin  is warm and dry.  Neurological:     General: No focal deficit present.     Mental Status: He is alert.     Comments: Oriented to self, able to f/c Confused about location and time.    Psychiatric:        Mood and Affect: Mood normal.     Labs reviewed: Recent Labs    09/28/23 0236 09/29/23 0752 10/01/23 0234 11/19/23 1128 07/08/24 0347 07/09/24 0333 07/10/24 0432  NA 142   < > 140   < > 139 144 146*  K 3.7   < > 3.6   < > 4.3 4.0 3.7  CL 106   < > 106   < > 108 113* 114*  CO2 26   < > 24   < > 14* 23 23  GLUCOSE 120*   < > 102*   < > 154* 168* 139*  BUN 49*   < > 32*   < > 73* 60* 38*  CREATININE 1.99*   < > 1.16   < > 2.72* 2.62* 1.58*  CALCIUM  8.7*   < > 8.8*   < > 8.3* 8.3* 8.5*  MG 2.1  --  2.2  --   --   --   --    < > = values in this interval not displayed.   Recent Labs    07/08/24 0347 07/09/24 0333 07/10/24 0432  AST 55* 47* 35  ALT 56* 57* 52*  ALKPHOS 121 121 117  BILITOT 0.6 0.5 0.4  PROT 6.2* 6.2* 6.7  ALBUMIN 2.2* 2.2* 2.3*   Recent Labs    09/25/23 2212 09/27/23 0228 07/07/24 1836 07/08/24 0347 07/09/24 0333  WBC 7.4   < > 7.7 6.2 7.9  NEUTROABS 5.6  --  5.4  --   --   HGB 11.7*   < > 11.1* 9.7* 10.0*  HCT 36.7*   < > 34.2* 29.5* 31.2*  MCV 91.5   < > 90.5 91.0 90.4  PLT 181   < > 237 186 228   < > = values in this interval not displayed.   Lab Results  Component Value Date   TSH 6.02 (A) 06/18/2023   Lab Results  Component Value Date   HGBA1C 7.9 02/11/2024   Lab Results  Component Value Date   CHOL 134 02/11/2024   HDL 38 02/11/2024   LDLCALC 59 02/11/2024   TRIG 185 (A) 02/11/2024   CHOLHDL 2.4 11/23/2016    Significant Diagnostic Results in last 30 days:  US  RENAL Result Date: 07/08/2024 CLINICAL DATA:  Acute kidney injury. EXAM: RENAL / URINARY TRACT ULTRASOUND COMPLETE COMPARISON:  Abdomen and pelvis CT dated 07/07/2024. FINDINGS: Right Kidney: Renal measurements: 11.8 x 6.8 x 6.1 cm = volume: 256 mL. Echogenicity  within normal limits. No mass or hydronephrosis visualized. Left Kidney: Renal measurements: 9.6 x 5.2 x 5.1 cm = volume: 132 mL. Diffuse cortical thinning. Echogenicity within normal limits. No mass or hydronephrosis visualized. Bladder: Appears normal for degree of bladder distention. Other: The examination was limited by patient body habitus and lack of cooperation. IMPRESSION: 1. No acute abnormality. 2. Left renal atrophy. Electronically Signed   By: Elspeth Bathe M.D.   On: 07/08/2024 15:18   CT ABDOMEN PELVIS WO CONTRAST Result Date: 07/07/2024 EXAM: CT ABDOMEN AND PELVIS WITHOUT CONTRAST 07/07/2024 08:18:20 PM TECHNIQUE: CT of the abdomen and pelvis was performed without the administration of intravenous contrast. Multiplanar reformatted images are provided for review. Automated exposure control, iterative reconstruction, and/or weight-based adjustment of the mA/kV was utilized to reduce the radiation dose to as low as reasonably achievable. COMPARISON: None available. CLINICAL HISTORY: Abdominal pain, acute, nonlocalized; AKI, poor historian, eval for obstruction. FINDINGS: LOWER CHEST: Calcified bibasilar granulomas with calcified lymph node noted on the right interlobular region. Aortic valve leaflet calcification, mitral annular calcification, coronary artery calcification. LIVER: A fluid density lesion within the liver likely represents a simple hepatic cyst versus hemangioma. Subcentimeter hypodensities within the left hepatic lobe are too small to characterize (3.16). The liver is otherwise unremarkable. GALLBLADDER AND BILE DUCTS: Gallbladder is unremarkable. No biliary ductal dilatation. SPLEEN: Punctate calcifications throughout the spleen are consistent with the sequelae of prior granulomatous disease, as seen within the lungs. PANCREAS: Diffusely atrophic pancreas. No focal lesion. Otherwise normal pancreatic contour. No surrounding inflammatory changes. No main pancreatic ductal dilatation.  ADRENAL GLANDS: No acute abnormality. KIDNEYS, URETERS AND BLADDER: Atrophic left kidney with renal cortical scarring. Fluid density lesion within the left kidney likely represents a simple renal cyst. Simple renal cysts do not require additional follow-up unless clinically indicated due to signs/symptoms. No stones in the kidneys or ureters. No hydronephrosis. No perinephric or periureteral stranding. Urinary bladder is unremarkable. GI AND BOWEL: Stomach demonstrates no acute abnormality. No small or large bowel thickening or dilatation. The appendix is unremarkable. Colonic diverticulosis. PERITONEUM AND RETROPERITONEUM: No ascites. No free air. VASCULATURE: Severe atherosclerotic plaque of the aorta and  its main branches. LYMPH NODES: No lymphadenopathy. REPRODUCTIVE ORGANS: No acute abnormality. BONES AND SOFT TISSUES: Total right hip arthroplasty partially visualized. Multilevel mild-to-moderate degenerative change of the spine. Chronic L1 compression fracture. Density sclerotic lesion of the T12 level, likely a bone island. No focal soft tissue abnormality. IMPRESSION: 1. No acute findings in the abdomen or pelvis with limited evaluation on this noncontrast study. 2. Severe atherosclerotic plaque of the aorta and its main branches. 3. Sequelae of prior granulomatous disease. 4. Other, non-acute and/or normal findings as above. Electronically signed by: Morgane Naveau MD 07/07/2024 08:29 PM EST RP Workstation: [id number]   CT Head Wo Contrast Result Date: 07/07/2024 EXAM: CT HEAD WITHOUT CONTRAST 07/07/2024 07:08:00 PM TECHNIQUE: CT of the head was performed without the administration of intravenous contrast. Automated exposure control, iterative reconstruction, and/or weight based adjustment of the mA/kV was utilized to reduce the radiation dose to as low as reasonably achievable. COMPARISON: CT head 03/01/2023 CLINICAL HISTORY: Mental status change, unknown cause FINDINGS: BRAIN AND VENTRICLES: No acute  hemorrhage. No evidence of acute infarct. No hydrocephalus. No extra-axial collection. No mass effect or midline shift. Patchy white matter hypoattenuations, compatible with chronic and vascular ischemic change. ORBITS: No acute abnormality. SINUSES: No acute abnormality. SOFT TISSUES AND SKULL: No acute soft tissue abnormality. No skull fracture. IMPRESSION: 1. No acute intracranial abnormality. Electronically signed by: Gilmore Molt MD 07/07/2024 07:16 PM EST RP Workstation: [id number]   DG Chest Portable 1 View Result Date: 07/07/2024 CLINICAL DATA:  Altered level of consciousness EXAM: PORTABLE CHEST 1 VIEW COMPARISON:  09/28/2023 FINDINGS: Single frontal view of the chest demonstrates an unremarkable cardiac silhouette. No acute airspace disease, effusion, or pneumothorax. No acute bony abnormalities. IMPRESSION: 1. No acute intrathoracic process. Electronically Signed   By: Ozell Daring M.D.   On: 07/07/2024 19:05    Assessment/Plan  Acute encephalopathy secondary to urinary tract infection and dehydration Acute encephalopathy likely secondary to UTI and dehydration. Mentation improved during hospitalization. Treated with four days of Rocephin . Seems more confused right now from baseline  Memory loss Resides in skilled care.  Needs assistance with ADLs Has short term memory loss  Acute kidney injury Creatinine improved from 3.18 to 1.5 after IV fluids and holding Lasix  and valsartan . - Resumed Lasix  and valsartan  on December 12th.  Difficult to control hypertension Hypertension difficult to control. Blood pressure was 198/77. Lasix  and valsartan  held due to AKI. Currently on amlodipine , clonidine , and hydralazine . - Administered 2 PM medications and rechecked blood pressure.  Hypoxemia O2 saturation at 86%, improved from 90%. Likely due to hypoventilation from rotund abdomen. Chest x-ray showed no acute findings. - Continue 2 liters of oxygen. - Implement incentive  spirometry. - Monitor oxygen saturation closely. -check weight in am  Type 2 diabetes mellitus, diet controlled Type 2 diabetes mellitus, diet controlled. Last A1c was 7.1% on November 24th. - Continue diet control.  Gout Last uric acid level at 7.7. Currently on allopurinol . - Continue allopurinol .  Chronic anemia (chronic disease and iron  deficiency) Chronic anemia due to chronic disease and iron  deficiency. Hemoglobin was 10.0 on December 10th. Takes iron  supplements three times a week. - Continue iron  supplements three times a week.  Chronic cough Managed with Protonix . - Continue Protonix  (also with hx of peptic ulcer).  Constipation Managed with Miralax  and Senokot. Bowel regimen effective. - Continue Miralax  and Senokot.  Benign prostatic hyperplasia with urinary retention Benign prostatic hyperplasia with urinary retention. Passed voiding trial and able to void on his own.  Continues on Flomax . - Continue Flomax .  Hyperlipidemia Managed with reduced dose of Lipitor at 20 mg due to elevated LFTs. Last LDL was 59 on July 14th. - Continue Lipitor at 20 mg.  Peripheral artery disease Peripheral artery disease, managed conservatively.  Atrial fibrillation Controlled with Lopressor  and Eliquis  for stroke prevention. - Continue Lopressor  and Eliquis .  General health maintenance Discussion with wife regarding advanced care planning. Concerns about potential hospitalization. DNR in place. Wife considering a most form to avoid hospitalization and treat only in facility. - Continue to discuss advanced care planning with family. - Maintain DNR status.  Diastolic CHF Daily weights Resume lasix  in am Also on lopressor  and ARB therapy to resume in am  Conjunctivitis Green drainage both eyes Start gentamycin  Family/ staff Communication: discussed care with his wife Madeline.  Labs/tests ordered:  CBC CMP 1 week        [1]  Allergies Allergen Reactions   Lisinopril  Cough   Niaspan [Niacin Er (Antihyperlipidemic)] Rash

## 2024-07-10 NOTE — Addendum Note (Signed)
 Addended by: DARLEAN TAWNI CROME on: 07/10/2024 04:06 PM   Modules accepted: Orders

## 2024-07-11 ENCOUNTER — Inpatient Hospital Stay (HOSPITAL_COMMUNITY)
Admission: EM | Admit: 2024-07-11 | Discharge: 2024-07-15 | DRG: 871 | Disposition: A | Source: Skilled Nursing Facility | Attending: Internal Medicine | Admitting: Internal Medicine

## 2024-07-11 ENCOUNTER — Other Ambulatory Visit: Payer: Self-pay

## 2024-07-11 ENCOUNTER — Emergency Department (HOSPITAL_COMMUNITY)

## 2024-07-11 ENCOUNTER — Inpatient Hospital Stay (HOSPITAL_COMMUNITY)

## 2024-07-11 DIAGNOSIS — N1831 Chronic kidney disease, stage 3a: Secondary | ICD-10-CM | POA: Diagnosis present

## 2024-07-11 DIAGNOSIS — G9341 Metabolic encephalopathy: Secondary | ICD-10-CM | POA: Diagnosis present

## 2024-07-11 DIAGNOSIS — Y95 Nosocomial condition: Principal | ICD-10-CM

## 2024-07-11 DIAGNOSIS — Z7901 Long term (current) use of anticoagulants: Secondary | ICD-10-CM | POA: Diagnosis not present

## 2024-07-11 DIAGNOSIS — F32A Depression, unspecified: Secondary | ICD-10-CM | POA: Diagnosis present

## 2024-07-11 DIAGNOSIS — B370 Candidal stomatitis: Secondary | ICD-10-CM | POA: Diagnosis present

## 2024-07-11 DIAGNOSIS — I739 Peripheral vascular disease, unspecified: Secondary | ICD-10-CM | POA: Diagnosis present

## 2024-07-11 DIAGNOSIS — A411 Sepsis due to other specified staphylococcus: Secondary | ICD-10-CM | POA: Diagnosis present

## 2024-07-11 DIAGNOSIS — E871 Hypo-osmolality and hyponatremia: Secondary | ICD-10-CM | POA: Diagnosis present

## 2024-07-11 DIAGNOSIS — R6520 Severe sepsis without septic shock: Secondary | ICD-10-CM | POA: Diagnosis present

## 2024-07-11 DIAGNOSIS — Z66 Do not resuscitate: Secondary | ICD-10-CM | POA: Diagnosis present

## 2024-07-11 DIAGNOSIS — E872 Acidosis, unspecified: Secondary | ICD-10-CM | POA: Diagnosis present

## 2024-07-11 DIAGNOSIS — I130 Hypertensive heart and chronic kidney disease with heart failure and stage 1 through stage 4 chronic kidney disease, or unspecified chronic kidney disease: Secondary | ICD-10-CM | POA: Diagnosis present

## 2024-07-11 DIAGNOSIS — J189 Pneumonia, unspecified organism: Secondary | ICD-10-CM | POA: Diagnosis present

## 2024-07-11 DIAGNOSIS — I160 Hypertensive urgency: Secondary | ICD-10-CM | POA: Diagnosis not present

## 2024-07-11 DIAGNOSIS — E870 Hyperosmolality and hypernatremia: Secondary | ICD-10-CM | POA: Diagnosis present

## 2024-07-11 DIAGNOSIS — I6350 Cerebral infarction due to unspecified occlusion or stenosis of unspecified cerebral artery: Secondary | ICD-10-CM | POA: Diagnosis present

## 2024-07-11 DIAGNOSIS — I161 Hypertensive emergency: Secondary | ICD-10-CM | POA: Diagnosis present

## 2024-07-11 DIAGNOSIS — I5032 Chronic diastolic (congestive) heart failure: Secondary | ICD-10-CM | POA: Diagnosis present

## 2024-07-11 DIAGNOSIS — J9601 Acute respiratory failure with hypoxia: Secondary | ICD-10-CM | POA: Diagnosis present

## 2024-07-11 DIAGNOSIS — Z1152 Encounter for screening for COVID-19: Secondary | ICD-10-CM | POA: Diagnosis not present

## 2024-07-11 DIAGNOSIS — G9340 Encephalopathy, unspecified: Secondary | ICD-10-CM | POA: Diagnosis not present

## 2024-07-11 DIAGNOSIS — I480 Paroxysmal atrial fibrillation: Secondary | ICD-10-CM | POA: Diagnosis present

## 2024-07-11 DIAGNOSIS — I4892 Unspecified atrial flutter: Secondary | ICD-10-CM | POA: Diagnosis present

## 2024-07-11 DIAGNOSIS — D631 Anemia in chronic kidney disease: Secondary | ICD-10-CM | POA: Diagnosis present

## 2024-07-11 DIAGNOSIS — Z515 Encounter for palliative care: Secondary | ICD-10-CM | POA: Diagnosis not present

## 2024-07-11 LAB — COMPREHENSIVE METABOLIC PANEL WITH GFR
ALT: 48 U/L — ABNORMAL HIGH (ref 0–44)
AST: 32 U/L (ref 15–41)
Albumin: 2.6 g/dL — ABNORMAL LOW (ref 3.5–5.0)
Alkaline Phosphatase: 122 U/L (ref 38–126)
Anion gap: 10 (ref 5–15)
BUN: 31 mg/dL — ABNORMAL HIGH (ref 8–23)
CO2: 21 mmol/L — ABNORMAL LOW (ref 22–32)
Calcium: 8.8 mg/dL — ABNORMAL LOW (ref 8.9–10.3)
Chloride: 116 mmol/L — ABNORMAL HIGH (ref 98–111)
Creatinine, Ser: 1.46 mg/dL — ABNORMAL HIGH (ref 0.61–1.24)
GFR, Estimated: 45 mL/min — ABNORMAL LOW (ref >60)
Glucose, Bld: 138 mg/dL — ABNORMAL HIGH (ref 70–99)
Potassium: 3.8 mmol/L (ref 3.5–5.1)
Sodium: 147 mmol/L — ABNORMAL HIGH (ref 135–145)
Total Bilirubin: 0.4 mg/dL (ref 0.0–1.2)
Total Protein: 7.2 g/dL (ref 6.5–8.1)

## 2024-07-11 LAB — I-STAT VENOUS BLOOD GAS, ED
Acid-base deficit: 1 mmol/L (ref 0.0–2.0)
Bicarbonate: 21.4 mmol/L (ref 20.0–28.0)
Calcium, Ion: 1.12 mmol/L — ABNORMAL LOW (ref 1.15–1.40)
HCT: 28 % — ABNORMAL LOW (ref 39.0–52.0)
Hemoglobin: 9.5 g/dL — ABNORMAL LOW (ref 13.0–17.0)
O2 Saturation: 96 %
Potassium: 3.5 mmol/L (ref 3.5–5.1)
Sodium: 152 mmol/L — ABNORMAL HIGH (ref 135–145)
TCO2: 22 mmol/L (ref 22–32)
pCO2, Ven: 29 mmHg — ABNORMAL LOW (ref 44–60)
pH, Ven: 7.477 — ABNORMAL HIGH (ref 7.25–7.43)
pO2, Ven: 74 mmHg — ABNORMAL HIGH (ref 32–45)

## 2024-07-11 LAB — AMMONIA: Ammonia: 16 umol/L (ref 9–35)

## 2024-07-11 LAB — CBC WITH DIFFERENTIAL/PLATELET
Abs Immature Granulocytes: 0.06 K/uL (ref 0.00–0.07)
Basophils Absolute: 0.1 K/uL (ref 0.0–0.1)
Basophils Relative: 0 %
Eosinophils Absolute: 0.1 K/uL (ref 0.0–0.5)
Eosinophils Relative: 1 %
HCT: 35.7 % — ABNORMAL LOW (ref 39.0–52.0)
Hemoglobin: 11.5 g/dL — ABNORMAL LOW (ref 13.0–17.0)
Immature Granulocytes: 1 %
Lymphocytes Relative: 8 %
Lymphs Abs: 1 K/uL (ref 0.7–4.0)
MCH: 29.6 pg (ref 26.0–34.0)
MCHC: 32.2 g/dL (ref 30.0–36.0)
MCV: 91.8 fL (ref 80.0–100.0)
Monocytes Absolute: 0.7 K/uL (ref 0.1–1.0)
Monocytes Relative: 6 %
Neutro Abs: 9.7 K/uL — ABNORMAL HIGH (ref 1.7–7.7)
Neutrophils Relative %: 84 %
Platelets: 269 K/uL (ref 150–400)
RBC: 3.89 MIL/uL — ABNORMAL LOW (ref 4.22–5.81)
RDW: 13.8 % (ref 11.5–15.5)
WBC: 11.6 K/uL — ABNORMAL HIGH (ref 4.0–10.5)
nRBC: 0 % (ref 0.0–0.2)

## 2024-07-11 LAB — RESP PANEL BY RT-PCR (RSV, FLU A&B, COVID)  RVPGX2
Influenza A by PCR: NEGATIVE
Influenza B by PCR: NEGATIVE
Resp Syncytial Virus by PCR: NEGATIVE
SARS Coronavirus 2 by RT PCR: NEGATIVE

## 2024-07-11 LAB — I-STAT CG4 LACTIC ACID, ED: Lactic Acid, Venous: 0.9 mmol/L (ref 0.5–1.9)

## 2024-07-11 MED ORDER — LABETALOL HCL 5 MG/ML IV SOLN: 10.0000 mg | INTRAVENOUS | Status: DC | PRN

## 2024-07-11 MED ORDER — GABAPENTIN 100 MG PO CAPS: 200.0000 mg | ORAL_CAPSULE | Freq: Three times a day (TID) | ORAL | Status: DC

## 2024-07-11 MED ORDER — FINASTERIDE 5 MG PO TABS: 5.0000 mg | ORAL_TABLET | Freq: Every morning | ORAL | Status: DC

## 2024-07-11 MED ORDER — TAMSULOSIN HCL 0.4 MG PO CAPS: 0.4000 mg | ORAL_CAPSULE | Freq: Every day | ORAL | Status: DC

## 2024-07-11 MED ORDER — SODIUM CHLORIDE 0.9 % IV SOLN
1.0000 g | INTRAVENOUS | Status: DC
  Administered 2024-07-11 – 2024-07-12 (×2): 1 g via INTRAVENOUS
  Filled 2024-07-11 (×2): qty 10

## 2024-07-11 MED ORDER — METOPROLOL TARTRATE 25 MG PO TABS: 75.0000 mg | ORAL_TABLET | Freq: Two times a day (BID) | ORAL | Status: DC

## 2024-07-11 MED ORDER — LABETALOL HCL 5 MG/ML IV SOLN
10.0000 mg | INTRAVENOUS | Status: DC | PRN
  Administered 2024-07-11: 10 mg via INTRAVENOUS
  Filled 2024-07-11: qty 4

## 2024-07-11 MED ORDER — AMLODIPINE BESYLATE 5 MG PO TABS: 10.0000 mg | ORAL_TABLET | Freq: Every morning | ORAL | Status: DC

## 2024-07-11 MED ORDER — CLEVIDIPINE BUTYRATE 0.5 MG/ML IV EMUL
0.0000 mg/h | INTRAVENOUS | Status: DC
  Filled 2024-07-11: qty 100

## 2024-07-11 MED ORDER — MELATONIN 5 MG PO TABS: 5.0000 mg | ORAL_TABLET | Freq: Every day | ORAL | Status: DC

## 2024-07-11 MED ORDER — AZITHROMYCIN 250 MG PO TABS: 500.0000 mg | ORAL_TABLET | Freq: Every day | ORAL | Status: DC

## 2024-07-11 MED ORDER — IRBESARTAN 150 MG PO TABS: 150.0000 mg | ORAL_TABLET | Freq: Every day | ORAL | Status: DC

## 2024-07-11 MED ORDER — APIXABAN 2.5 MG PO TABS: 2.5000 mg | ORAL_TABLET | Freq: Two times a day (BID) | ORAL | Status: DC

## 2024-07-11 MED ORDER — LABETALOL HCL 5 MG/ML IV SOLN
10.0000 mg | Freq: Four times a day (QID) | INTRAVENOUS | Status: DC | PRN
  Administered 2024-07-11: 10 mg via INTRAVENOUS
  Filled 2024-07-11: qty 4

## 2024-07-11 MED ORDER — PANTOPRAZOLE SODIUM 40 MG PO TBEC: 40.0000 mg | DELAYED_RELEASE_TABLET | Freq: Two times a day (BID) | ORAL | Status: DC

## 2024-07-11 MED ORDER — ENOXAPARIN SODIUM 30 MG/0.3ML IJ SOSY: 30.0000 mg | PREFILLED_SYRINGE | INTRAMUSCULAR | Status: DC

## 2024-07-11 MED ORDER — VANCOMYCIN HCL IN DEXTROSE 1-5 GM/200ML-% IV SOLN: 1000.0000 mg | Freq: Once | INTRAVENOUS | Status: DC

## 2024-07-11 MED ORDER — SODIUM CHLORIDE 0.9 % IV SOLN
500.0000 mg | INTRAVENOUS | Status: AC
  Administered 2024-07-11 – 2024-07-13 (×3): 500 mg via INTRAVENOUS
  Filled 2024-07-11 (×3): qty 5

## 2024-07-11 MED ORDER — ATORVASTATIN CALCIUM 40 MG PO TABS: 40.0000 mg | ORAL_TABLET | Freq: Every evening | ORAL | Status: DC

## 2024-07-11 MED ORDER — SENNOSIDES-DOCUSATE SODIUM 8.6-50 MG PO TABS: 2.0000 | ORAL_TABLET | Freq: Every day | ORAL | Status: DC

## 2024-07-11 MED ORDER — ADULT MULTIVITAMIN W/MINERALS CH: 1.0000 | ORAL_TABLET | Freq: Every morning | ORAL | Status: DC

## 2024-07-11 MED ORDER — LABETALOL HCL 5 MG/ML IV SOLN
10.0000 mg | Freq: Once | INTRAVENOUS | Status: AC
  Administered 2024-07-11: 10 mg via INTRAVENOUS
  Filled 2024-07-11: qty 4

## 2024-07-11 MED ORDER — SODIUM CHLORIDE 0.9 % IV SOLN
2.0000 g | Freq: Once | INTRAVENOUS | Status: AC
  Administered 2024-07-11: 2 g via INTRAVENOUS
  Filled 2024-07-11: qty 12.5

## 2024-07-11 MED ORDER — VANCOMYCIN HCL 1500 MG/300ML IV SOLN
1500.0000 mg | Freq: Once | INTRAVENOUS | Status: AC
  Administered 2024-07-11: 1500 mg via INTRAVENOUS
  Filled 2024-07-11: qty 300

## 2024-07-11 MED ORDER — POLYETHYLENE GLYCOL 3350 17 G PO PACK: 17.0000 g | PACK | Freq: Every day | ORAL | Status: DC

## 2024-07-11 NOTE — ED Notes (Addendum)
 Rn received report from prior nurse that patient blood pressure was in the 200's. Rn send a chat and also ask the secretary to contact the admitting physician. Per Dr Georgina he will contact critical care to come and see the patient. Order wad place to give the patient Labetalol  10 mg once. Eventually blood pressure was check manually it was 190/60. Dr Georgina sent a chat that critical care suggested the patient be placed on Cleviprex. Rn spoke to the [employer] and he suggested Rn called the doctor and see if the patient can be placed on something else. RN spoke to the pharmacist and he suggested that the MD could place the patient on Metoprolol  or Labetalol  schedule or prn IV. Rn chat the Dr Georgina to let him aware what the pharmacist suggested. Order was placed for Labetalol  10 mg every 6 hours prn for high blood pressure.

## 2024-07-11 NOTE — ED Notes (Signed)
 Pt changed into new brief and bedding changed. Pt washed and changed into new gown

## 2024-07-11 NOTE — Evaluation (Signed)
 Clinical/Bedside Swallow Evaluation Patient Details  Name: Nathan Santiago MRN: [id number] Date of Birth: 1930/08/26  Today's Date: 07/11/2024 Time: SLP Start Time (ACUTE ONLY): 1458 SLP Stop Time (ACUTE ONLY): 1514 SLP Time Calculation (min) (ACUTE ONLY): 16 min  Past Medical History:  Past Medical History:  Diagnosis Date   AAA (abdominal aortic aneurysm)    asymptomatic   AAA (abdominal aortic aneurysm) 2010   peripheral  angiogram-- bilateral SFA DISEASE  and 60 to 70% infrarenaal abd. aortic stenosis with 15 -mm gradient   Adenomatous colon polyp 11/2003   CAD (coronary artery disease)    Gait abnormality 02/13/2017   Gastroparesis    pt denies   GERD (gastroesophageal reflux disease)    w/ LPR   History of kidney stones    x2   Hyperlipidemia    Hypertension    IDA (iron  deficiency anemia)    Peripheral arterial disease    RCEA  by Dr Medford Brunswick   PUD (peptic ulcer disease)    pt unaware   Vertigo    Past Surgical History:  Past Surgical History:  Procedure Laterality Date   ABDOMINAL AORTOGRAM N/A 11/19/2023   Procedure: ABDOMINAL AORTOGRAM;  Surgeon: Sheree Penne Bruckner, MD;  Location: [facility] INVASIVE CV LAB;  Service: Cardiovascular;  Laterality: N/A;   ABDOMINAL AORTOGRAM W/LOWER EXTREMITY Bilateral 08/05/2018   Procedure: ABDOMINAL AORTOGRAM W/LOWER EXTREMITY;  Surgeon: Court Dorn PARAS, MD;  Location: [facility] INVASIVE CV LAB;  Service: Cardiovascular;  Laterality: Bilateral;   ABDOMINAL AORTOGRAM W/LOWER EXTREMITY N/A 09/26/2021   Procedure: ABDOMINAL AORTOGRAM W/LOWER EXTREMITY;  Surgeon: Court Dorn PARAS, MD;  Location: [facility] INVASIVE CV LAB;  Service: Cardiovascular;  Laterality: N/A;   AORTOGRAM  04/24/2016    Abdominal aortogram, bilateral iliac angiogram, bifemoral runoff   CARDIAC CATHETERIZATION  05/12/2005   RCA   carotid doppler  01/24/2013   RICA endarterectomy,left CCA 0-49%; left bulb and prox ICA 50-69%; bilaateral subclavian < 50%   CAROTID  ENDARTERECTOMY  11/01/2010   CATARACT EXTRACTION     COLONOSCOPY     CORONARY ANGIOPLASTY  05/18/2005   2 STENTS distal RCA AND PROXIMAL-MID RCA   5 total stents per pt.   CYSTOSCOPY/URETEROSCOPY/HOLMIUM LASER/STENT PLACEMENT Left 01/11/2018   Procedure: LEFT URETEROSCOPY/HOLMIUM LASER/STENT PLACEMENT;  Surgeon: Cam Morene ORN, MD;  Location: [facility];  Service: Urology;  Laterality: Left;   DOPPLER ECHOCARDIOGRAPHY  02/07/2012   EF 55%,SHOWED NO ISCHEMIA    HERNIA REPAIR     umbilical   lower arterial  doppler  02/04/2013   aotra 1.5 x 1.5 cm; distal abd aorta 70-99%,proximal common iliac arteries -very stenotic with increased velocities>50%,may be falsely elevated as a result of residual plaque from the distal aorta stenosis   LOWER EXTREMITY ANGIOGRAPHY N/A 11/19/2023   Procedure: Lower Extremity Angiography;  Surgeon: Sheree Penne Bruckner, MD;  Location: [facility] INVASIVE CV LAB;  Service: Cardiovascular;  Laterality: N/A;   lower extremity doppler  June 18 ,2013   ABI'S ABNORMAL, RABI was 0.88 and LABI 0.75 ,with 3-vessel  run off   NM MYOVIEW  LTD  MAY 23,2011   showed no significant ischemia;   NM MYOVIEW  LTD  04/22/2008   ef 77%,exercise capcity 6 METS ,exaggerated blood pressure response to exercise   PERIPHERAL VASCULAR CATHETERIZATION N/A 04/24/2016   Procedure: Lower Extremity Angiography;  Surgeon: Dorn PARAS Court, MD;  Location: [facility] INVASIVE CV LAB;  Service: Cardiovascular;  Laterality: N/A;   PERIPHERAL VASCULAR CATHETERIZATION  04/24/2016   Procedure: Peripheral Vascular  Intervention;  Surgeon: Dorn JINNY Lesches, MD;  Location: [facility] INVASIVE CV LAB;  Service: Cardiovascular;;  Aorta   retrograde central aortic catheterization  05/19/2005   cutting balloon atherectomy, c-circ stenosis with DES STENTING CYPHER   TONSILLECTOMY     TOTAL HIP ARTHROPLASTY Right 07/11/2022   Procedure: TOTAL HIP ARTHROPLASTY ANTERIOR APPROACH;  Surgeon: Ernie Cough, MD;  Location: [facility];  Service:  Orthopedics;  Laterality: Right;   TOTAL KNEE ARTHROPLASTY Left 01/03/2019   Procedure: TOTAL KNEE ARTHROPLASTY;  Surgeon: Kay Kemps, MD;  Location: [facility];  Service: Orthopedics;  Laterality: Left;  with IS block   TRANSURETHRAL RESECTION OF PROSTATE N/A 09/08/2016   Procedure: TRANSURETHRAL RESECTION OF THE PROSTATE (TURP);  Surgeon: Morene LELON Salines, MD;  Location: [facility];  Service: Urology;  Laterality: N/A;   TRANSURETHRAL RESECTION OF PROSTATE     10-10-17  Dr. Salines   TRANSURETHRAL RESECTION OF PROSTATE N/A 10/10/2017   Procedure: TRANSURETHRAL RESECTION OF THE PROSTATE (TURP);  Surgeon: Salines Morene LELON, MD;  Location: [facility];  Service: Urology;  Laterality: N/A;   VASECTOMY     HPI:  89 yo male presenting to ED 12/12 from [facility] with AMS. CTH negative. CXR shows new small L pleural effusion vs L basilar airspace opacity with increased interstitial markings. Recently admitted 12/8-12/11 for acute cystitis and AKI with CXR 12/8 showing no acute process. MBS 09/28/23 shows mild pharyngeal dysphagia characterized by mistiming with resulting penetration of thin liquids expelled by a cued throat clear. SLP recommended regular diet with thin liquids no ongoing f/u. PMH: AAA, CAD, GERD with LPR, HLD, HTN, PUD, vertigo, PAF, PAD, BPH    Assessment / Plan / Recommendation  Clinical Impression  Recommend he remain NPO. Question his ability to take PO meds given prolonged oral holding and difficulty following commands. SLP will f/u as able for ongoing assessment of swallow function as mentation allows.   Pt presents with AMS, affecting acceptance and awareness of boluses. He turned his head away from all PO presentations and when a small amount of applesauce was placed on his tongue, he did not initiate oral transit. With Max cueing, he could not seal his lips and swallow so the bolus was removed.  SLP Visit Diagnosis: Dysphagia, unspecified (R13.10)    Aspiration Risk  Moderate  aspiration risk    Diet Recommendation           Other Recommendations Oral Care Recommendations: Oral care QID     Swallow Evaluation Recommendations Recommendations: NPO Medication Administration: Via alternative means Oral care recommendations: Oral care QID (4x/day)   Assistance Recommended at Discharge    Functional Status Assessment Patient has had a recent decline in their functional status and demonstrates the ability to make significant improvements in function in a reasonable and predictable amount of time.  Frequency and Duration min 2x/week  2 weeks       Prognosis Prognosis for improved oropharyngeal function: Good Barriers to Reach Goals: Time post onset      Swallow Study   General HPI: 89 yo male presenting to ED 12/12 from [facility] with AMS. CTH negative. CXR shows new small L pleural effusion vs L basilar airspace opacity with increased interstitial markings. Recently admitted 12/8-12/11 for acute cystitis and AKI with CXR 12/8 showing no acute process. MBS 09/28/23 shows mild pharyngeal dysphagia characterized by mistiming with resulting penetration of thin liquids expelled by a cued throat clear. SLP recommended regular diet with thin liquids no ongoing f/u.  PMH: AAA, CAD, GERD with LPR, HLD, HTN, PUD, vertigo, PAF, PAD, BPH Type of Study: Bedside Swallow Evaluation Previous Swallow Assessment: see HPI Diet Prior to this Study: NPO Temperature Spikes Noted: No Respiratory Status: Room air History of Recent Intubation: No Behavior/Cognition: Alert;Confused;Requires cueing Oral Cavity Assessment: Dried secretions Oral Care Completed by SLP: No Oral Cavity - Dentition: Adequate natural dentition Vision: Functional for self-feeding Self-Feeding Abilities: Total assist Patient Positioning: Upright in bed Baseline Vocal Quality: Normal Volitional Cough: Congested Volitional Swallow: Unable to elicit    Oral/Motor/Sensory Function Overall Oral  Motor/Sensory Function: Within functional limits   Ice Chips Ice chips: Impaired Presentation: Spoon Oral Phase Impairments: Poor awareness of bolus   Thin Liquid Thin Liquid: Impaired Presentation: Straw Oral Phase Impairments: Poor awareness of bolus    Nectar Thick Nectar Thick Liquid: Not tested   Honey Thick Honey Thick Liquid: Not tested   Puree Puree: Impaired Presentation: Spoon Oral Phase Impairments: Reduced labial seal;Poor awareness of bolus Oral Phase Functional Implications: Oral holding   Solid     Solid: Not tested      Damien Blumenthal, M.A., CCC-SLP Speech Language Pathology, Acute Rehabilitation Services  Secure Chat preferred [phone]  07/11/2024,3:46 PM

## 2024-07-11 NOTE — ED Notes (Signed)
 Messaged provider MD Georgina regarding patients BP and his failed swallow screen. PT has been given IV labetalol  without affect on his BP as it remains 212/66 at this time. Pt has also been recommended to stay NPO by speech as he failed their swallow screen. This nurse provided oral care and attempted a swallow screen and pt was unable to follow directions.  Pt has missed a couple of doses of his Eliquis  at this point as well as his scheduled daily medication and need direction.

## 2024-07-11 NOTE — ED Triage Notes (Signed)
 BIBEMS from [facility] for lethargy. Discharged yesterday from [facility] for UTI. Pt is more confused than normal and hot to touch. Pt was spitting out meds and became combative at facility. GCS 13

## 2024-07-11 NOTE — ED Notes (Signed)
 Performed mouth care and attempted to do a swallow screen on patient. Pt was unable to follow directions and just held his mouth open.

## 2024-07-11 NOTE — ED Notes (Signed)
 Report was given to Leita Cooler, RN at [phone]. She was aware that the patient pressure was elevated. I reported to her that the charge nurse wanted me to see if the patient can be placed on something. RN ask if she could contact the primary not just about the blood pressure medication, but also is Eliquis .

## 2024-07-11 NOTE — H&P (Addendum)
 History and Physical    Patient: Nathan Santiago FMW:[id number] DOB: 05-25-1931 DOA: 07/11/2024 DOS: the patient was seen and examined on 07/11/2024 PCP: Charlanne Fredia CROME, MD  Patient coming from: Home  Chief Complaint:  Chief Complaint  Patient presents with   Altered Mental Status   HPI: Nathan Santiago is a 89 y.o. male with medical history significant of CAD, PAF, AAA, PAD, HTN, HLD,  and recent admission last week from 12/8-12/11 for acute cystitis c/b AKI (s/p IV CTX x4 days; Cr down from 3.18-->1.5) p/w acute encephalopathy iso hypertensive emergency c/b LLL CAP.  Pt is unable to provide a medical history. From what I can obtain from Epic review ,and wife at bedside, the pt returned to his facility yesterday and on examination by the provider at the facility was sent back to the ED as pt was poorly responsive. Per wife, pt presented with similar sx early last week, but got progressively better prior to discharge yesterday; thus, his current acute decline is unclear.  In the ED, pt hypertensive (SBP 190s). Labs notable for Na 147-->152, Cr 1.46, and WBC 11.6. CXR showed new small left pleural effusion versus left basilar airspace opacity. EDP requested medicine admission for acute encephalopathy c/b LLL CAP.   Review of Systems: As mentioned in the history of present illness. All other systems reviewed and are negative. Past Medical History:  Diagnosis Date   AAA (abdominal aortic aneurysm)    asymptomatic   AAA (abdominal aortic aneurysm) 2010   peripheral  angiogram-- bilateral SFA DISEASE  and 60 to 70% infrarenaal abd. aortic stenosis with 15 -mm gradient   Adenomatous colon polyp 11/2003   CAD (coronary artery disease)    Gait abnormality 02/13/2017   Gastroparesis    pt denies   GERD (gastroesophageal reflux disease)    w/ LPR   History of kidney stones    x2   Hyperlipidemia    Hypertension    IDA (iron  deficiency anemia)    Peripheral arterial disease     RCEA  by Dr Medford Brunswick   PUD (peptic ulcer disease)    pt unaware   Vertigo    Past Surgical History:  Procedure Laterality Date   ABDOMINAL AORTOGRAM N/A 11/19/2023   Procedure: ABDOMINAL AORTOGRAM;  Surgeon: Sheree Penne Bruckner, MD;  Location: [facility] INVASIVE CV LAB;  Service: Cardiovascular;  Laterality: N/A;   ABDOMINAL AORTOGRAM W/LOWER EXTREMITY Bilateral 08/05/2018   Procedure: ABDOMINAL AORTOGRAM W/LOWER EXTREMITY;  Surgeon: Court Dorn PARAS, MD;  Location: [facility] INVASIVE CV LAB;  Service: Cardiovascular;  Laterality: Bilateral;   ABDOMINAL AORTOGRAM W/LOWER EXTREMITY N/A 09/26/2021   Procedure: ABDOMINAL AORTOGRAM W/LOWER EXTREMITY;  Surgeon: Court Dorn PARAS, MD;  Location: [facility] INVASIVE CV LAB;  Service: Cardiovascular;  Laterality: N/A;   AORTOGRAM  04/24/2016    Abdominal aortogram, bilateral iliac angiogram, bifemoral runoff   CARDIAC CATHETERIZATION  05/12/2005   RCA   carotid doppler  01/24/2013   RICA endarterectomy,left CCA 0-49%; left bulb and prox ICA 50-69%; bilaateral subclavian < 50%   CAROTID ENDARTERECTOMY  11/01/2010   CATARACT EXTRACTION     COLONOSCOPY     CORONARY ANGIOPLASTY  05/18/2005   2 STENTS distal RCA AND PROXIMAL-MID RCA   5 total stents per pt.   CYSTOSCOPY/URETEROSCOPY/HOLMIUM LASER/STENT PLACEMENT Left 01/11/2018   Procedure: LEFT URETEROSCOPY/HOLMIUM LASER/STENT PLACEMENT;  Surgeon: Cam Morene ORN, MD;  Location: [facility];  Service: Urology;  Laterality: Left;   DOPPLER ECHOCARDIOGRAPHY  02/07/2012   EF 55%,SHOWED  NO ISCHEMIA    HERNIA REPAIR     umbilical   lower arterial  doppler  02/04/2013   aotra 1.5 x 1.5 cm; distal abd aorta 70-99%,proximal common iliac arteries -very stenotic with increased velocities>50%,may be falsely elevated as a result of residual plaque from the distal aorta stenosis   LOWER EXTREMITY ANGIOGRAPHY N/A 11/19/2023   Procedure: Lower Extremity Angiography;  Surgeon: Sheree Penne Bruckner, MD;  Location: [facility] INVASIVE  CV LAB;  Service: Cardiovascular;  Laterality: N/A;   lower extremity doppler  June 18 ,2013   ABI'S ABNORMAL, RABI was 0.88 and LABI 0.75 ,with 3-vessel  run off   NM MYOVIEW  LTD  MAY 23,2011   showed no significant ischemia;   NM MYOVIEW  LTD  04/22/2008   ef 77%,exercise capcity 6 METS ,exaggerated blood pressure response to exercise   PERIPHERAL VASCULAR CATHETERIZATION N/A 04/24/2016   Procedure: Lower Extremity Angiography;  Surgeon: Dorn JINNY Lesches, MD;  Location: [facility] INVASIVE CV LAB;  Service: Cardiovascular;  Laterality: N/A;   PERIPHERAL VASCULAR CATHETERIZATION  04/24/2016   Procedure: Peripheral Vascular Intervention;  Surgeon: Dorn JINNY Lesches, MD;  Location: [facility] INVASIVE CV LAB;  Service: Cardiovascular;;  Aorta   retrograde central aortic catheterization  05/19/2005   cutting balloon atherectomy, c-circ stenosis with DES STENTING CYPHER   TONSILLECTOMY     TOTAL HIP ARTHROPLASTY Right 07/11/2022   Procedure: TOTAL HIP ARTHROPLASTY ANTERIOR APPROACH;  Surgeon: Ernie Cough, MD;  Location: [facility];  Service: Orthopedics;  Laterality: Right;   TOTAL KNEE ARTHROPLASTY Left 01/03/2019   Procedure: TOTAL KNEE ARTHROPLASTY;  Surgeon: Kay Kemps, MD;  Location: [facility];  Service: Orthopedics;  Laterality: Left;  with IS block   TRANSURETHRAL RESECTION OF PROSTATE N/A 09/08/2016   Procedure: TRANSURETHRAL RESECTION OF THE PROSTATE (TURP);  Surgeon: Morene LELON Salines, MD;  Location: [facility];  Service: Urology;  Laterality: N/A;   TRANSURETHRAL RESECTION OF PROSTATE     10-10-17  Dr. Salines   TRANSURETHRAL RESECTION OF PROSTATE N/A 10/10/2017   Procedure: TRANSURETHRAL RESECTION OF THE PROSTATE (TURP);  Surgeon: Salines Morene LELON, MD;  Location: [facility];  Service: Urology;  Laterality: N/A;   VASECTOMY     Social History:  reports that he quit smoking about 55 years ago. His smoking use included cigarettes. He started smoking about 75 years ago. He has a 40 pack-year smoking history. He has  never used smokeless tobacco. He reports that he does not currently use alcohol  after a past usage of about 4.0 standard drinks of alcohol  per week. He reports that he does not use drugs.  Allergies[1]  Family History  Problem Relation Age of Onset   Ovarian cancer Sister    Heart disease Father    Brain cancer Brother        Tumor    Prior to Admission medications  Medication Sig Start Date End Date Taking? Authorizing Provider  acetaminophen  (TYLENOL ) 500 MG tablet Take 1,000 mg by mouth every 12 (twelve) hours as needed for mild pain (pain score 1-3).    [provider]  allopurinol  (ZYLOPRIM ) 100 MG tablet Take 1 tablet (100 mg total) by mouth daily. 05/26/24   Charlanne Fredia CROME, MD  amLODipine  (NORVASC ) 10 MG tablet Take 10 mg by mouth in the morning. Hold if BP <95 06/07/22   [provider]  apixaban  (ELIQUIS ) 2.5 MG TABS tablet Take 1 tablet (2.5 mg total) by mouth 2 (two) times daily. 07/10/24 08/09/24  Pahwani, Ravi, MD  Apoaequorin  10 MG CAPS Take 10 mg by mouth in the morning.    [provider]  atorvastatin  (LIPITOR) 20 MG tablet Take 1 tablet (20 mg total) by mouth daily. 07/10/24 07/10/25  Vernon Ranks, MD  Biotin  10 MG TABS Take 10 mg by mouth in the morning. Once a morning 8am-11am    [provider]  cetirizine  (ZYRTEC ) 10 MG tablet Take 1 tablet (10 mg total) by mouth at bedtime. 11/16/22   Darlean Maus, NP  cholecalciferol  (VITAMIN D3) 25 MCG (1000 UT) tablet Take 1,000 Units by mouth in the morning.    [provider]  cloNIDine  (CATAPRES ) 0.1 MG tablet Take 1 tablet (0.1 mg total) by mouth 3 (three) times daily. Hold if BP < 95 12/12/23   Fargo, Amy E, NP  finasteride  (PROSCAR ) 5 MG tablet Take 5 mg by mouth in the morning.    [provider]  furosemide  (LASIX ) 20 MG tablet Take 20 mg by mouth daily as needed (HOLD UNTIL 01/28/2024).    [provider]  furosemide  (LASIX ) 40 MG tablet Take 1 tablet (40 mg  total) by mouth daily. HOLD UNTIL 01/28/2024 01/23/24   Fargo, Amy E, NP  gabapentin  (NEURONTIN ) 100 MG capsule Take 200 mg by mouth 3 (three) times daily.    [provider]  gentamicin  (GARAMYCIN ) 0.3 % ophthalmic solution Place 2 drops into both eyes 4 (four) times daily for 7 days. 07/10/24 07/17/24  Darlean Maus, NP  Glucosamine-Chondroitin 750-600 MG TABS Take 1 tablet by mouth 2 (two) times daily.    [provider]  hydrALAZINE  (APRESOLINE ) 50 MG tablet Take 50 mg by mouth 4 (four) times daily. Hold if BP <95    [provider]  iron  polysaccharides (NIFEREX) 150 MG capsule Take 150 mg by mouth every Monday, Wednesday, and Friday.    [provider]  melatonin 5 MG TABS Take 1 tablet (5 mg total) by mouth at bedtime. 10/13/22   Darlean Maus, NP  metoprolol  tartrate (LOPRESSOR ) 50 MG tablet Take 75 mg by mouth 2 (two) times daily.    [provider]  Multiple Vitamin (MULTIVITAMIN WITH MINERALS) TABS tablet Take 1 tablet by mouth in the morning.    [provider]  pantoprazole  (PROTONIX ) 40 MG tablet Take 40 mg by mouth 2 (two) times daily.    [provider]  polyethylene glycol (MIRALAX ) 17 g packet Take 17 g by mouth daily. 02/28/24   Wert, Christina, NP  potassium chloride  SA (K-DUR,KLOR-CON ) 20 MEQ tablet Take 20 mEq by mouth every evening.  07/16/13   [provider]  sennosides-docusate sodium  (SENOKOT-S) 8.6-50 MG tablet Take 2 tablets by mouth at bedtime. Patient taking differently: Take 1 tablet by mouth every other day. Every other evening 02/28/24   Darlean Maus, NP  sodium fluoride (PREVIDENT) 1.1 % GEL dental gel Place 1 Application onto teeth at bedtime.    [provider]  tamsulosin  (FLOMAX ) 0.4 MG CAPS capsule Take 1 capsule (0.4 mg total) by mouth daily after supper. 05/15/22   Ezenduka, Nkeiruka J, MD  valsartan  (DIOVAN ) 80 MG tablet Take 1.5 tablets (120 mg total) by mouth daily.  Give  120 mg by mouth in the morning for Hypertension Valsartan  120 mg QD 03/18/24 03/18/25  Darlean Maus, NP    Physical Exam: Vitals:   07/11/24 0300 07/11/24 0500 07/11/24 0600 07/11/24 0700  BP: (!) 177/83 (!) 192/55 (!) 195/59 (!) 187/53  Pulse: 69 75 80 79  Resp: 19  16 17 19   Temp:      TempSrc:      SpO2: 93% 94% 97% 95%   General: Alert, oriented x3, resting comfortably in no acute distress Respiratory: LLL rhonchi; no wheezing Cardiovascular: Regular rate and rhythm w/o m/r/g   Data Reviewed:  Lab Results  Component Value Date   WBC 11.6 (H) 07/11/2024   HGB 9.5 (L) 07/11/2024   HCT 28.0 (L) 07/11/2024   MCV 91.8 07/11/2024   PLT 269 07/11/2024   Lab Results  Component Value Date   GLUCOSE 138 (H) 07/11/2024   CALCIUM  8.8 (L) 07/11/2024   NA 152 (H) 07/11/2024   K 3.5 07/11/2024   CO2 21 (L) 07/11/2024   CL 116 (H) 07/11/2024   BUN 31 (H) 07/11/2024   CREATININE 1.46 (H) 07/11/2024   Lab Results  Component Value Date   ALT 48 (H) 07/11/2024   AST 32 07/11/2024   ALKPHOS 122 07/11/2024   BILITOT 0.4 07/11/2024   Lab Results  Component Value Date   INR 0.9 07/09/2022   INR 1.1 05/13/2022   INR 1.06 11/22/2016   Radiology: DG Chest Port 1 View Result Date: 07/11/2024 EXAM: 1 VIEW(S) XRAY OF THE CHEST 07/11/2024 03:16:00 AM COMPARISON: CXR 07/07/2024, CT Chest 03/01/2023, CT A/P 07/07/2024. CLINICAL HISTORY: Altered mental state. FINDINGS: LUNGS AND PLEURA: New small left pleural effusion versus left base airspace opacity. Increased interstitial markings. No pneumothorax. HEART AND MEDIASTINUM: Unchanged cardiomediastinal silhouette. Atherosclerotic calcifications. BONES AND SOFT TISSUES: No acute osseous abnormality. IMPRESSION: 1. New small left pleural effusion versus left basilar airspace opacity. 2. Increased interstitial markings. Electronically signed by: Morgane Naveau MD 07/11/2024 03:30 AM EST RP Workstation: [id number]    Assessment and Plan: 54M  h/o CAD, PAF, AAA, PAD, HTN, HLD, GERD, BPH and recent admission last week from 12/8-12/11 for acute cystitis c/b AKI (s/p IV CTX x5 days; Cr down from 3.18-->1.5) p/w ongoing confusion and found to have LLL CAP.  HTN emergency -CCM consulted; apprec eval -IV labetalol  10mg  q6h prn SBP >170 -PTA amlodipine  10mg  daily and irbesartan  150mg  daily (pta valsartan  120mg  daily) once able to tol PO -HOLD pta hyddralazine and clonidine  for now  LLL CAP Acute encephalopathy Possible aspiration given poor mentation; unclear etiology for rapid decline -SLP consultd; apprec eval/ recs -IV CTX 1g daily to complete 5 day CAP course -IV azithromycin 500mg  daily to complete 3 day CAP course -Duonebs prn -Wean O2 as tolerated -Ambulatory pulse ox prior to d/c  PAF -PTA metoprolol  and apixaban   HLD -PTA atorvastatin  40mg  daily  GERD -PTA pantoprazole   BPH -PTA finasteride  5mg  daily and Flomax  0.4mg  daily    Advance Care Planning:   Code Status: Prior   Consults: CCM  Family Communication: Wife  Severity of Illness: The appropriate patient status for this patient is INPATIENT. Inpatient status is judged to be reasonable and necessary in order to provide the required intensity of service to ensure the patient's safety. The patient's presenting symptoms, physical exam findings, and initial radiographic and laboratory data in the context of their chronic comorbidities is felt to place them at high risk for further clinical deterioration. Furthermore, it is not anticipated that the patient will be medically stable for discharge from the hospital within 2 midnights of admission.   * I certify that at the point of admission it is my clinical judgment that the patient will require inpatient hospital care spanning beyond 2 midnights from the point of admission due to high intensity of  service, high risk for further deterioration and high frequency of surveillance required.*   ------- I spent 55  minutes reviewing previous notes, at the bedside counseling/discussing the treatment plan, and performing clinical documentation.  Author: Marsha Ada, MD 07/11/2024 7:51 AM  For on call review [email or web address].      [1]  Allergies Allergen Reactions   Lisinopril Cough   Niaspan [Niacin Er (Antihyperlipidemic)] Rash

## 2024-07-11 NOTE — ED Notes (Signed)
 Dr. Olevia paged and secure chat sent to clarify b/p med ordered at 1300 today.

## 2024-07-11 NOTE — ED Notes (Signed)
 Secretary contacted to page on call doctor for this patient to figure out the plan of care, concerning patients blood pressure and ordered medication. Charge nurse contacted.

## 2024-07-11 NOTE — ED Notes (Signed)
 Patient did not get Eliquis  this morning, because of confusion and not able to swallow his po medication.

## 2024-07-11 NOTE — ED Notes (Signed)
 Provider MD Georgina has been paged.

## 2024-07-11 NOTE — ED Provider Notes (Signed)
 [place] EMERGENCY DEPARTMENT AT [facility] ([place]) Provider Note   CSN: [id number] Arrival date & time: 07/11/24  9771     Patient presents with: Altered Mental Status   Nathan Santiago is a 89 y.o. male.   The history is provided by the patient, the EMS personnel and medical records. No language interpreter was used.  Altered Mental Status    89 year old male with history of paroxysmal atrial fibrillation, CAD, AAA, hypertension, CAD, hyperlipidemia, sent here from skilled nursing facility with concerns of altered mental status.  Patient was recently admitted to the hospital on 07/07/2024 and was discharged on 07/10/2024 for evaluation of altered mental status likely in the setting of UTI.  Patient received several days of Rocephin  as well as IV fluid.  CT scan of the head at that time was normal.  However after being discharged back to the facility, family member states that patient appears to be increasingly more confused, spitting out his medication, seems more altered than his baseline and they voiced concern.  Patient was also placed in a nasal cannula upon arrival as he seems to have little bit more trouble keeping his oxygen adequate.  Patient not normally on oxygen.  No report of any cough and patient is without any complaint.  Patient is a DNR/DNI according to family member.  Prior to Admission medications  Medication Sig Start Date End Date Taking? Authorizing Provider  acetaminophen  (TYLENOL ) 500 MG tablet Take 1,000 mg by mouth every 12 (twelve) hours as needed for mild pain (pain score 1-3).    [provider]  allopurinol  (ZYLOPRIM ) 100 MG tablet Take 1 tablet (100 mg total) by mouth daily. 05/26/24   Charlanne Fredia CROME, MD  amLODipine  (NORVASC ) 10 MG tablet Take 10 mg by mouth in the morning. Hold if BP <95 06/07/22   [provider]  apixaban  (ELIQUIS ) 2.5 MG TABS tablet Take 1 tablet (2.5 mg total) by mouth 2 (two) times daily. 07/10/24 08/09/24   Pahwani, Ravi, MD  Apoaequorin 10 MG CAPS Take 10 mg by mouth in the morning.    [provider]  atorvastatin  (LIPITOR) 20 MG tablet Take 1 tablet (20 mg total) by mouth daily. 07/10/24 07/10/25  Vernon Ranks, MD  Biotin  10 MG TABS Take 10 mg by mouth in the morning. Once a morning 8am-11am    [provider]  cetirizine  (ZYRTEC ) 10 MG tablet Take 1 tablet (10 mg total) by mouth at bedtime. 11/16/22   Darlean Maus, NP  cholecalciferol  (VITAMIN D3) 25 MCG (1000 UT) tablet Take 1,000 Units by mouth in the morning.    [provider]  cloNIDine  (CATAPRES ) 0.1 MG tablet Take 1 tablet (0.1 mg total) by mouth 3 (three) times daily. Hold if BP < 95 12/12/23   Fargo, Amy E, NP  finasteride  (PROSCAR ) 5 MG tablet Take 5 mg by mouth in the morning.    [provider]  furosemide  (LASIX ) 20 MG tablet Take 20 mg by mouth daily as needed (HOLD UNTIL 01/28/2024).    [provider]  furosemide  (LASIX ) 40 MG tablet Take 1 tablet (40 mg total) by mouth daily. HOLD UNTIL 01/28/2024 01/23/24   Fargo, Amy E, NP  gabapentin  (NEURONTIN ) 100 MG capsule Take 200 mg by mouth 3 (three) times daily.    [provider]  gentamicin  (GARAMYCIN ) 0.3 % ophthalmic solution Place 2 drops into both eyes 4 (four) times daily for 7 days. 07/10/24 07/17/24  Darlean Maus, NP  Glucosamine-Chondroitin 750-600  MG TABS Take 1 tablet by mouth 2 (two) times daily.    [provider]  hydrALAZINE  (APRESOLINE ) 50 MG tablet Take 50 mg by mouth 4 (four) times daily. Hold if BP <95    [provider]  iron  polysaccharides (NIFEREX) 150 MG capsule Take 150 mg by mouth every Monday, Wednesday, and Friday.    [provider]  melatonin 5 MG TABS Take 1 tablet (5 mg total) by mouth at bedtime. 10/13/22   Darlean Maus, NP  metoprolol  tartrate (LOPRESSOR ) 50 MG tablet Take 75 mg by mouth 2 (two) times daily.    [provider]  Multiple Vitamin (MULTIVITAMIN  WITH MINERALS) TABS tablet Take 1 tablet by mouth in the morning.    [provider]  pantoprazole  (PROTONIX ) 40 MG tablet Take 40 mg by mouth 2 (two) times daily.    [provider]  polyethylene glycol (MIRALAX ) 17 g packet Take 17 g by mouth daily. 02/28/24   Wert, Christina, NP  potassium chloride  SA (K-DUR,KLOR-CON ) 20 MEQ tablet Take 20 mEq by mouth every evening.  07/16/13   [provider]  sennosides-docusate sodium  (SENOKOT-S) 8.6-50 MG tablet Take 2 tablets by mouth at bedtime. Patient taking differently: Take 1 tablet by mouth every other day. Every other evening 02/28/24   Darlean Maus, NP  sodium fluoride (PREVIDENT) 1.1 % GEL dental gel Place 1 Application onto teeth at bedtime.    [provider]  tamsulosin  (FLOMAX ) 0.4 MG CAPS capsule Take 1 capsule (0.4 mg total) by mouth daily after supper. 05/15/22   Ezenduka, Nkeiruka J, MD  valsartan  (DIOVAN ) 80 MG tablet Take 1.5 tablets (120 mg total) by mouth daily.  Give 120 mg by mouth in the morning for Hypertension Valsartan  120 mg QD 03/18/24 03/18/25  Wert, Christina, NP    Allergies: Lisinopril and Niaspan [niacin er (antihyperlipidemic)]    Review of Systems  Unable to perform ROS: Mental status change  All other systems reviewed and are negative.   Updated Vital Signs BP (!) 194/67   Pulse 76   Temp 98.5 F (36.9 C) (Oral)   Resp 20   SpO2 94%   Physical Exam Constitutional:      General: He is not in acute distress.    Appearance: He is well-developed.     Comments: Elderly male, laying in bed, in no acute discomfort.  He is wearing supplemental oxygen but not in any respiratory discomfort.  HENT:     Head: Atraumatic.  Eyes:     Extraocular Movements: Extraocular movements intact.     Conjunctiva/sclera: Conjunctivae normal.     Pupils: Pupils are equal, round, and reactive to light.  Neck:     Comments: No nuchal rigidity Cardiovascular:     Rate and Rhythm: Normal rate  and regular rhythm.     Pulses: Normal pulses.     Heart sounds: Normal heart sounds.  Pulmonary:     Effort: Pulmonary effort is normal.     Breath sounds: Normal breath sounds.  Abdominal:     Palpations: Abdomen is soft.     Tenderness: There is no abdominal tenderness.  Musculoskeletal:     Cervical back: Normal range of motion and neck supple.     Comments: Follows command, able to move all 4 extremities  Skin:    Findings: No rash.  Neurological:     Mental Status: He is alert.     Comments: Alert oriented x 1     (all  labs ordered are listed, but only abnormal results are displayed) Labs Reviewed  CBC WITH DIFFERENTIAL/PLATELET - Abnormal; Notable for the following components:      Result Value   WBC 11.6 (*)    RBC 3.89 (*)    Hemoglobin 11.5 (*)    HCT 35.7 (*)    Neutro Abs 9.7 (*)    All other components within normal limits  COMPREHENSIVE METABOLIC PANEL WITH GFR - Abnormal; Notable for the following components:   Sodium 147 (*)    Chloride 116 (*)    CO2 21 (*)    Glucose, Bld 138 (*)    BUN 31 (*)    Creatinine, Ser 1.46 (*)    Calcium  8.8 (*)    Albumin 2.6 (*)    ALT 48 (*)    GFR, Estimated 45 (*)    All other components within normal limits  I-STAT VENOUS BLOOD GAS, ED - Abnormal; Notable for the following components:   pH, Ven 7.477 (*)    pCO2, Ven 29.0 (*)    pO2, Ven 74 (*)    Sodium 152 (*)    Calcium , Ion 1.12 (*)    HCT 28.0 (*)    Hemoglobin 9.5 (*)    All other components within normal limits  RESP PANEL BY RT-PCR (RSV, FLU A&B, COVID)  RVPGX2  CULTURE, BLOOD (ROUTINE X 2)  CULTURE, BLOOD (ROUTINE X 2)  AMMONIA  URINALYSIS, ROUTINE W REFLEX MICROSCOPIC  I-STAT CG4 LACTIC ACID, ED  CBG MONITORING, ED    EKG: None  Radiology: DG Chest Port 1 View Result Date: 07/11/2024 EXAM: 1 VIEW(S) XRAY OF THE CHEST 07/11/2024 03:16:00 AM COMPARISON: CXR 07/07/2024, CT Chest 03/01/2023, CT A/P 07/07/2024. CLINICAL HISTORY: Altered mental  state. FINDINGS: LUNGS AND PLEURA: New small left pleural effusion versus left base airspace opacity. Increased interstitial markings. No pneumothorax. HEART AND MEDIASTINUM: Unchanged cardiomediastinal silhouette. Atherosclerotic calcifications. BONES AND SOFT TISSUES: No acute osseous abnormality. IMPRESSION: 1. New small left pleural effusion versus left basilar airspace opacity. 2. Increased interstitial markings. Electronically signed by: Morgane Naveau MD 07/11/2024 03:30 AM EST RP Workstation: [id number]     .Critical Care  Performed by: Nivia Colon, PA-C Authorized by: Nivia Colon, PA-C   Critical care provider statement:    Critical care time (minutes):  30   Critical care was time spent personally by me on the following activities:  Development of treatment plan with patient or surrogate, discussions with consultants, evaluation of patient's response to treatment, examination of patient, ordering and review of laboratory studies, ordering and review of radiographic studies, ordering and performing treatments and interventions, pulse oximetry, re-evaluation of patient's condition and review of old charts    Medications Ordered in the ED  vancomycin (VANCOREADY) IVPB 1500 mg/300 mL (1,500 mg Intravenous New Bag/Given 07/11/24 0504)  ceFEPIme  (MAXIPIME ) 2 g in sodium chloride  0.9 % 100 mL IVPB (0 g Intravenous Stopped 07/11/24 0456)    Clinical Course as of 07/11/24 0528  Fri Jul 11, 2024  0254 Recurrent AMS Just discharged for same to SNF for UTI  [CC]    Clinical Course User Index [CC] Jerral Meth, MD                                 Medical Decision Making Amount and/or Complexity of Data Reviewed Labs: ordered. Radiology: ordered.  Risk Prescription drug management.   BP (!) 177/83   Pulse 69  Temp 98.5 F (36.9 C) (Oral)   Resp 19   SpO2 93%   49:83 AM  89 year old male with history of paroxysmal atrial fibrillation, CAD, AAA, hypertension, CAD,  hyperlipidemia, sent here from skilled nursing facility with concerns of altered mental status.  Patient was recently admitted to the hospital on 07/07/2024 and was discharged on 07/10/2024 for evaluation of altered mental status likely in the setting of UTI.  Patient received several days of Rocephin  as well as IV fluid.  CT scan of the head at that time was normal.  However after being discharged back to the facility, family member states that patient appears to be increasingly more confused, spitting out his medication, seems more altered than his baseline and they voiced concern.  Patient was also placed in a nasal cannula upon arrival as he seems to have little bit more trouble keeping his oxygen adequate.  Patient not normally on oxygen.  No report of any cough and patient is without any complaint.  Patient is a DNR/DNI according to family member.  On exam, patient is drowsy but easily arousable and in no acute discomfort.  He does not have any reproducible tenderness.  His skin is warm to the touch.  He is wearing supplemental oxygen.  He is alert and oriented x 1.  -Labs ordered, independently viewed and interpreted by me.  Labs remarkable for elevated white count of 11.6.  Mild AKI with creatinine of 1.46, normal lactic acid, VBG showing a pH of 7.47 -The patient was maintained on a cardiac monitor.  I personally viewed and interpreted the cardiac monitored which showed an underlying rhythm of: Sinus rhythm -Imaging independently viewed and interpreted by me and I agree with radiologist's interpretation.  Result remarkable for chest x-ray concerning for new small left pleural effusion versus left basilar airspace opacity with increased interstitial markings -This patient presents to the ED for concern of altered mental status, this involves an extensive number of treatment options, and is a complaint that carries with it a high risk of complications and morbidity.  The differential diagnosis includes  pneumonia, UTI, infection, sepsis, hypoxemia -Co morbidities that complicate the patient evaluation includes A-fib, CAD, AAA, hypertension, hyperlipidemia -Treatment includes broad-spectrum antibiotic including bank, cefepime  along with supplemental oxygen at 2 L -Reevaluation of the patient after these medicines showed that the patient improved -PCP office notes or outside notes reviewed -Discussion with attending Dr. Jerral.  I appreciate consultation from [facility] Dr. Franky who agrees to admit pt for acute respiratory failure in the setting of hospital acquired pneumonia causing altered mentation -Escalation to admission/observation considered: patient will be admitted.       Final diagnoses:  Hospital-acquired pneumonia  Acute respiratory failure with hypoxia [facility])    ED Discharge Orders     None          Nivia Colon, PA-C 07/11/24 9471    Jerral Meth, MD 07/11/24 [phone]

## 2024-07-12 LAB — CBC
HCT: 35.2 % — ABNORMAL LOW (ref 39.0–52.0)
Hemoglobin: 11.2 g/dL — ABNORMAL LOW (ref 13.0–17.0)
MCH: 29.6 pg (ref 26.0–34.0)
MCHC: 31.8 g/dL (ref 30.0–36.0)
MCV: 93.1 fL (ref 80.0–100.0)
Platelets: 252 K/uL (ref 150–400)
RBC: 3.78 MIL/uL — ABNORMAL LOW (ref 4.22–5.81)
RDW: 14 % (ref 11.5–15.5)
WBC: 11.6 K/uL — ABNORMAL HIGH (ref 4.0–10.5)
nRBC: 0 % (ref 0.0–0.2)

## 2024-07-12 LAB — BASIC METABOLIC PANEL WITH GFR
Anion gap: 12 (ref 5–15)
Anion gap: 14 (ref 5–15)
BUN: 23 mg/dL (ref 8–23)
BUN: 19 mg/dL (ref 8–23)
CO2: 19 mmol/L — ABNORMAL LOW (ref 22–32)
CO2: 21 mmol/L — ABNORMAL LOW (ref 22–32)
Calcium: 9.1 mg/dL (ref 8.9–10.3)
Calcium: 8.8 mg/dL — ABNORMAL LOW (ref 8.9–10.3)
Chloride: 120 mmol/L — ABNORMAL HIGH (ref 98–111)
Chloride: 117 mmol/L — ABNORMAL HIGH (ref 98–111)
Creatinine, Ser: 1.34 mg/dL — ABNORMAL HIGH (ref 0.61–1.24)
Creatinine, Ser: 1.2 mg/dL (ref 0.61–1.24)
GFR, Estimated: 50 mL/min — ABNORMAL LOW (ref >60)
GFR, Estimated: 57 mL/min — ABNORMAL LOW (ref >60)
Glucose, Bld: 164 mg/dL — ABNORMAL HIGH (ref 70–99)
Glucose, Bld: 180 mg/dL — ABNORMAL HIGH (ref 70–99)
Potassium: 3.6 mmol/L (ref 3.5–5.1)
Potassium: 3.3 mmol/L — ABNORMAL LOW (ref 3.5–5.1)
Sodium: 151 mmol/L — ABNORMAL HIGH (ref 135–145)
Sodium: 152 mmol/L — ABNORMAL HIGH (ref 135–145)

## 2024-07-12 LAB — BLOOD CULTURE ID PANEL (REFLEXED) - BCID2

## 2024-07-12 LAB — MAGNESIUM: Magnesium: 2.1 mg/dL (ref 1.7–2.4)

## 2024-07-12 LAB — APTT
aPTT: 51 s — ABNORMAL HIGH (ref 24–36)
aPTT: 78 s — ABNORMAL HIGH (ref 24–36)

## 2024-07-12 LAB — BRAIN NATRIURETIC PEPTIDE: B Natriuretic Peptide: 600.5 pg/mL — ABNORMAL HIGH (ref 0.0–100.0)

## 2024-07-12 LAB — HEPARIN LEVEL (UNFRACTIONATED)
Heparin Unfractionated: >1.1 [IU]/mL — ABNORMAL HIGH (ref 0.30–0.70)
Heparin Unfractionated: >1.1 [IU]/mL — ABNORMAL HIGH (ref 0.30–0.70)

## 2024-07-12 MED ORDER — LINEZOLID 600 MG/300ML IV SOLN
600.0000 mg | Freq: Two times a day (BID) | INTRAVENOUS | Status: DC
  Administered 2024-07-12 – 2024-07-14 (×4): 600 mg via INTRAVENOUS
  Filled 2024-07-12 (×5): qty 300

## 2024-07-12 MED ORDER — SODIUM CHLORIDE 0.45 % IV SOLN: INTRAVENOUS | Status: DC

## 2024-07-12 MED ORDER — METOPROLOL TARTRATE 5 MG/5ML IV SOLN
INTRAVENOUS | Status: AC
  Administered 2024-07-12: 5 mg via INTRAVENOUS
  Filled 2024-07-12: qty 5

## 2024-07-12 MED ORDER — HYDRALAZINE HCL 20 MG/ML IJ SOLN
10.0000 mg | Freq: Four times a day (QID) | INTRAMUSCULAR | Status: DC
  Administered 2024-07-12 – 2024-07-13 (×5): 10 mg via INTRAVENOUS
  Filled 2024-07-12 (×5): qty 1

## 2024-07-12 MED ORDER — HEPARIN (PORCINE) 25000 UT/250ML-% IV SOLN
1350.0000 [IU]/h | INTRAVENOUS | Status: DC
  Administered 2024-07-12: 1000 [IU]/h via INTRAVENOUS
  Administered 2024-07-13: 1200 [IU]/h via INTRAVENOUS
  Filled 2024-07-12 (×2): qty 250

## 2024-07-12 MED ORDER — AMIODARONE HCL IN DEXTROSE 360-4.14 MG/200ML-% IV SOLN
60.0000 mg/h | INTRAVENOUS | Status: AC
  Administered 2024-07-12 (×2): 60 mg/h via INTRAVENOUS
  Filled 2024-07-12: qty 200

## 2024-07-12 MED ORDER — AMIODARONE HCL IN DEXTROSE 360-4.14 MG/200ML-% IV SOLN
30.0000 mg/h | INTRAVENOUS | Status: DC
  Administered 2024-07-12 – 2024-07-13 (×3): 30 mg/h via INTRAVENOUS
  Filled 2024-07-12 (×3): qty 200

## 2024-07-12 MED ORDER — LACTATED RINGERS IV BOLUS
1000.0000 mL | Freq: Once | INTRAVENOUS | Status: AC
  Administered 2024-07-12: 1000 mL via INTRAVENOUS

## 2024-07-12 MED ORDER — SODIUM CHLORIDE 0.45 % IV SOLN
INTRAVENOUS | Status: DC
  Filled 2024-07-12 (×4): qty 1000

## 2024-07-12 MED ORDER — METOPROLOL TARTRATE 5 MG/5ML IV SOLN: 5.0000 mg | Freq: Once | INTRAVENOUS | Status: AC

## 2024-07-12 MED ORDER — HYDRALAZINE HCL 20 MG/ML IJ SOLN
10.0000 mg | Freq: Four times a day (QID) | INTRAMUSCULAR | Status: DC | PRN
  Administered 2024-07-12 – 2024-07-13 (×2): 10 mg via INTRAVENOUS
  Filled 2024-07-12 (×2): qty 1

## 2024-07-12 MED ORDER — DILTIAZEM HCL-DEXTROSE 125-5 MG/125ML-% IV SOLN (PREMIX)
INTRAVENOUS | Status: AC
  Administered 2024-07-12: 5 mg/h via INTRAVENOUS
  Filled 2024-07-12: qty 125

## 2024-07-12 MED ORDER — DILTIAZEM HCL-DEXTROSE 125-5 MG/125ML-% IV SOLN (PREMIX)
5.0000 mg/h | INTRAVENOUS | Status: DC
  Administered 2024-07-13: 08:00:00 5 mg/h via INTRAVENOUS
  Administered 2024-07-13 – 2024-07-14 (×3): 15 mg/h via INTRAVENOUS
  Filled 2024-07-12 (×5): qty 125

## 2024-07-12 MED ORDER — FLUCONAZOLE IN SODIUM CHLORIDE 200-0.9 MG/100ML-% IV SOLN
200.0000 mg | INTRAVENOUS | Status: DC
  Administered 2024-07-12 – 2024-07-14 (×3): 200 mg via INTRAVENOUS
  Filled 2024-07-12 (×3): qty 100

## 2024-07-12 NOTE — ED Notes (Signed)
 Pt went into SVT with heart rate increasing to 160s. Pt remains altered. Skin hot to touch. Charge made aware. Dr. Alfornia paged. ED attending notified.

## 2024-07-12 NOTE — ED Notes (Addendum)
 Multiple attempts to get another IV.

## 2024-07-12 NOTE — Progress Notes (Signed)
 PHARMACY - ANTICOAGULATION CONSULT NOTE  Pharmacy Consult for Heparin  Indication: atrial fibrillation  Allergies[1]  Patient Measurements:    Vital Signs: Temp: 99.4 F (37.4 C) (12/13 0107) Temp Source: Rectal (12/13 0107) BP: 186/74 (12/12 2301) Pulse Rate: 167 (12/13 0058)  Labs: Recent Labs    07/09/24 0333 07/10/24 0432 07/11/24 0250 07/11/24 0510  HGB 10.0*  --  11.5* 9.5*  HCT 31.2*  --  35.7* 28.0*  PLT 228  --  269  --   CREATININE 2.62* 1.58* 1.46*  --     Estimated Creatinine Clearance: 31.8 mL/min (A) (by C-G formula based on SCr of 1.46 mg/dL (H)).   Medical History: Past Medical History:  Diagnosis Date   AAA (abdominal aortic aneurysm)    asymptomatic   AAA (abdominal aortic aneurysm) 2010   peripheral  angiogram-- bilateral SFA DISEASE  and 60 to 70% infrarenaal abd. aortic stenosis with 15 -mm gradient   Adenomatous colon polyp 11/2003   CAD (coronary artery disease)    Gait abnormality 02/13/2017   Gastroparesis    pt denies   GERD (gastroesophageal reflux disease)    w/ LPR   History of kidney stones    x2   Hyperlipidemia    Hypertension    IDA (iron  deficiency anemia)    Peripheral arterial disease    RCEA  by Dr Medford Brunswick   PUD (peptic ulcer disease)    pt unaware   Vertigo     Medications:  Scheduled:   apixaban   2.5 mg Oral BID   atorvastatin   40 mg Oral QPM   finasteride   5 mg Oral q AM   gabapentin   200 mg Oral TID   irbesartan   150 mg Oral Daily   melatonin  5 mg Oral QHS   metoprolol  tartrate  75 mg Oral BID   multivitamin with minerals  1 tablet Oral q AM   pantoprazole   40 mg Oral BID   polyethylene glycol  17 g Oral Daily   senna-docusate  2 tablet Oral QHS   tamsulosin   0.4 mg Oral QHS    Assessment: 89 y.o. male with h/o Afib, Eliquis  on hold, for heparin   Goal of Therapy:  aPTT 66-102 sec Heparin  level 0.3-0.7 units/ml Monitor platelets by anticoagulation protocol: Yes   Plan:  D/C  Eliquis  Start heparin  1000 units/hr Check aPTT in 8 hours  Nathan Santiago, Nathan Santiago 07/12/2024,2:21 AM      [1]  Allergies Allergen Reactions   Lisinopril Cough   Niaspan [Niacin Er (Antihyperlipidemic)] Rash

## 2024-07-12 NOTE — Progress Notes (Signed)
 PHARMACY - ANTICOAGULATION CONSULT NOTE  Pharmacy Consult for Heparin  Indication: atrial fibrillation  Allergies[1]  Patient Measurements:    Vital Signs: Temp: 99.5 F (37.5 C) (12/13 1400) Temp Source: Axillary (12/13 1400) BP: 175/78 (12/13 1920) Pulse Rate: 106 (12/13 1900)  Labs: Recent Labs    07/10/24 0432 07/11/24 0250 07/11/24 0250 07/11/24 0510 07/12/24 0206 07/12/24 1000 07/12/24 1943  HGB  --  11.5*   < > 9.5* 11.2*  --   --   HCT  --  35.7*  --  28.0* 35.2*  --   --   PLT  --  269  --   --  252  --   --   APTT  --   --   --   --   --  51* 78*  HEPARINUNFRC  --   --   --   --   --  >1.10* >1.10*  CREATININE 1.58* 1.46*  --   --  1.34*  --   --    < > = values in this interval not displayed.    Estimated Creatinine Clearance: 34.6 mL/min (A) (by C-G formula based on SCr of 1.34 mg/dL (H)).   Medical History: Past Medical History:  Diagnosis Date   AAA (abdominal aortic aneurysm)    asymptomatic   AAA (abdominal aortic aneurysm) 2010   peripheral  angiogram-- bilateral SFA DISEASE  and 60 to 70% infrarenaal abd. aortic stenosis with 15 -mm gradient   Adenomatous colon polyp 11/2003   CAD (coronary artery disease)    Gait abnormality 02/13/2017   Gastroparesis    pt denies   GERD (gastroesophageal reflux disease)    w/ LPR   History of kidney stones    x2   Hyperlipidemia    Hypertension    IDA (iron  deficiency anemia)    Peripheral arterial disease    RCEA  by Dr Medford Brunswick   PUD (peptic ulcer disease)    pt unaware   Vertigo     Medications:  Scheduled:   atorvastatin   40 mg Oral QPM   finasteride   5 mg Oral q AM   gabapentin   200 mg Oral TID   hydrALAZINE   10 mg Intravenous Q6H   melatonin  5 mg Oral QHS   multivitamin with minerals  1 tablet Oral q AM   pantoprazole   40 mg Oral BID   polyethylene glycol  17 g Oral Daily   senna-docusate  2 tablet Oral QHS   tamsulosin   0.4 mg Oral QHS    Assessment: 89 y.o. male with  h/o Afib, Eliquis  on hold, for heparin   12/13 PM: aPTT therapeutic at 78 seconds on 1200 units/hr, Anti-Xa level elevated as expected with recent Eliquis  dose  Goal of Therapy:  aPTT 66-102 sec Heparin  level 0.3-0.7 units/ml Monitor platelets by anticoagulation protocol: Yes   Plan:  Continue heparin  infusion at1200 units/hr AM Heparin  level and aPTT CBC daily  Larraine Brazier, PharmD Clinical Pharmacist 07/12/2024  8:20 PM **Pharmacist phone directory can now be found on [email or web address] ([email or web address]).  Listed under [facility].         [1]  Allergies Allergen Reactions   Lisinopril Cough   Niaspan [Niacin Er (Antihyperlipidemic)] Rash

## 2024-07-12 NOTE — ED Notes (Signed)
 Pt moved to acute room. Defib pads placed on pt.

## 2024-07-12 NOTE — ED Notes (Signed)
 Pt HR now in 70s. New EKG obtained. Alfornia, MD and Cesario, MD notified of pts rhythm change.

## 2024-07-12 NOTE — ED Notes (Signed)
 Pt went into SVT heart rate to 160 bpm. Pt remains altered, skin hot to touch. Charge made aware. Hosp paged. Attending notified.

## 2024-07-12 NOTE — ED Notes (Signed)
 Pt found in SVT in Yellow zone.  Admitting provider paged and ED Provider informed.

## 2024-07-12 NOTE — ED Notes (Incomplete)
 Pt going into SVT. Charge made aware attempting to contact hospitalist/attending

## 2024-07-12 NOTE — Progress Notes (Addendum)
 Overnight cross coverage  Notified by RN that patient was tachycardic up to 200 and evaluated by ED physician and was given 1 L IV fluid bolus.  Patient seen and examined at bedside.  He is awake and alert.  Denies chest pain or shortness of breath.  Noted to be in A-fib with RVR with rate intermittently going up to 190s.  Not hypotensive/SBP in the 150s.  No tachypnea or increasing oxygen requirement.  Lungs clear on exam.  Informed by RN that patient has been n.p.o. since admission due to poor mentation/aspiration risk and has not received his home metoprolol  and Eliquis .  He was getting IV labetalol  PRN elevated blood pressure. IV metoprolol  5 mg given and heart rate remains in the 130-140s.  No drop in blood pressure.  Last echo done in February showing preserved EF.  Started on Cardizem  drip.  Urgent labs ordered to check potassium and magnesium  level.  Hemoglobin was stable on labs and CT head done on admission was negative for intracranial bleed.  Started heparin  drip.  Discussed with on-call cardiologist Dr. Cesario who agrees with Cardizem  drip but if rate continues to be uncontrolled then recommending trying amiodarone  drip.

## 2024-07-12 NOTE — ED Notes (Addendum)
 MD Rathore paged back at this time. Pts HR ranging from 140-180 at this time.

## 2024-07-12 NOTE — ED Notes (Signed)
Pt went into

## 2024-07-12 NOTE — ED Notes (Signed)
 EDP Countryman, MD at bedside to assess pt while awaiting for hospitalist to page back. Verbal order to give 1L LR from Countryman, MD

## 2024-07-12 NOTE — ED Notes (Signed)
 Rathore, MD notified of pts HR still 120-160

## 2024-07-12 NOTE — Consult Note (Addendum)
 As below, patient seen and examined.  Briefly he is a 89 year old male with past medical history of coronary artery disease, paroxysmal atrial fibrillation, peripheral vascular disease, carotid artery disease, abdominal aortic aneurysm, hypertension, hyperlipidemia, anemia for evaluation of atrial fibrillation.  Has had previous PCI of his right coronary and left circumflex.  Last echocardiogram February 2025 showed normal LV function, mild left ventricular hypertrophy, grade 1 diastolic dysfunction, mild left atrial enlargement.  Patient discharged on December 11 after being admitted with acute encephalopathy felt secondary to dehydration and urinary tract infection.  He was treated with antibiotics and his renal function improved and he was ultimately discharged.  However he again developed confusion yesterday and was brought back to the emergency room.  At the time my evaluation he does not respond to questions.  Per his wife he has not been complaining of dyspnea, palpitations or chest pain.  Laboratory shows sodium 151, creatinine 1.34, BNP 600, white blood cell count 11.6, hemoglobin 11.2.  Electrocardiogram showed atrial flutter and atrial fibrillation with rapid ventricular response and diffuse ST depression.  Patient was placed on amiodarone  and converted to sinus rhythm.  Follow-up ECG showed sinus rhythm with nonspecific ST changes.  1 paroxysmal atrial fibrillation-patient presented with recurrent atrial fibrillation.  Likely secondary to stress of ongoing encephalopathy and possible infection.  He is back in sinus rhythm.  Continue IV amiodarone .  CHA2DS2-VASc is 4.  Continue IV heparin  and resume apixaban  once mental status improves.  2 hypertensive urgency-patient's blood pressure is elevated.  However he is unable to take his oral medications due to encephalopathy.  Would add hydralazine  10 mg IV every 6 hours and adjust based on follow-up readings.  Resume oral medications when able.  3  encephalopathy-etiology unclear; question secondary to pneumonia.  Mildly distended abdomen on examination.  He has been placed on antibiotics by primary care.  Further evaluation per their service.  4 history of coronary artery disease-resume statin when able.  Nathan Shallow, MD    Cardiology Consultation   Patient ID: Nathan Santiago MRN: [id number]; DOB: [birth year]  Admit date: 07/11/2024 Date of Consult: 07/12/2024  PCP:  Charlanne Fredia CROME, MD   [facility] Providers Cardiologist:  Dorn Lesches, MD        Patient Profile: Nathan Santiago is a 89 y.o. male with a history of CAD s/p DES x4 to RCA  with staged PCI with DES x1 to LCx in 04/2005, chronic HFpEF,  paroxysmal atrial fibrillation on Eliquis , PAD s/p DES to distal abdominal aorta in 03/2016, carotid artery disease s/p right CEA in 10/2010, AAA, hypertension, hyperlipidemia, GERD, and iron  deficiency anemia who is being seen 07/12/2024 for the evaluation of atrial fibrillation at the request of Dr. Vernon.  History of Present Illness: Nathan Santiago is a 89 year old male with the above history who is followed by Dr. Lesches. Patient has a history of CAD s/p  remote PCI with DES x4 to RCA and DES x1 to LCx in 2006. Last ischemic evaluation was a Myoview  in 11/2009 which showed no evidence of ischemia. Last Echo in 09/2023 showed LVEF of 60-65% with mild LVH and grade 1 diastolic dysfunction, normal RV function, and no significant valvular disease.   He also has an extensive history of PAD involving the abdominal aorta, lower extremity arteries, and carotid arteries. He underwent right CEA in 2012. Peripheral angiography in 2017 showed 75% stenosis of distal abdominal aorta, 95% stenosis of distal left common femoral artery, and 80%  stenosis of mid right SFA. He underwent stenting of the distal abdominal aorta at that time. Most recent carotid dopplers in 08/2023 showed 1-39% stenosis of right ICA. There was limited  evaluation of the proximal left ICA and ECA due to increased shadowing plaque. Most recent ABIs in 10/2023 for evaluation of right heel ulceration were severely reduced on the left (0.37) and non-compressible on the right. Peripheral angiography in 10/2023 heavily calcified aorta and bilateral common iliac arteries but no flow-limiting stenosis, heavily diseased bilateral common femoral with subtotal occlusion on the left, and a long segment stenosis  of right SFA followed by subtotal occlusion at adductor canal with subsequent occlusion at the knee. Plan was to consider right common femoral endarterectomy  with femoral to below-knee popliteal artery bypass; however, ulceration was almost completed healed at follow-up visit with Vascular Surgery in 12/2023 so revascularization was not felt to be needed.   He was last seen by Scot Ford, PA-C, in 04/2024 at which time he was stable from a cardiac standpoint.   He was recently admitted from 07/07/2024 to 07/10/2024 for acute metabolic encephalopathy, UTI, and AKI superimposed on CKD stage IIIb (creatinine 3.18) after presenting with increased weakness and letheragy. AKI was felt ot be due to dehydration and IT. He was treated with fluids and antibiotics.   He presented back to the ED on 07/11/2024 for lethargy and increased confusion. Upon arrival to the ED, he was markedly hypertensive with systolic BP as high as 212. Initial EKG showed normal sinus rhythm but then he developed rapid atrial fibrillation with rates in the 140s. Chest x-ray showed increased interstitial markings and new small left pleural effusion. WBC 11.6, Hgb 11.5, Plts 269. Na 147, K 3.8, Glucose 138, CO2 21, BUN 31, Cr 1.46. Albumin 2.6, AST 32, ALT 48, Alk Phos 122, Total Bili 0.4. Ammonia normal.  Respiratory panel negative for COVID-19, influenza A/B, and RSV. Lactic acid normal. Blood cultures positive for staphylococcus epidermis. He was started on antibiotics for community acquire pneumonia.   Cardiology consulted for atrial fibrillation.   Past Medical History:  Diagnosis Date   AAA (abdominal aortic aneurysm)    asymptomatic   AAA (abdominal aortic aneurysm) 2010   peripheral  angiogram-- bilateral SFA DISEASE  and 60 to 70% infrarenaal abd. aortic stenosis with 15 -mm gradient   Adenomatous colon polyp 11/2003   CAD (coronary artery disease)    Gait abnormality 02/13/2017   Gastroparesis    pt denies   GERD (gastroesophageal reflux disease)    w/ LPR   History of kidney stones    x2   Hyperlipidemia    Hypertension    IDA (iron  deficiency anemia)    Peripheral arterial disease    RCEA  by Dr Medford Brunswick   PUD (peptic ulcer disease)    pt unaware   Vertigo     Past Surgical History:  Procedure Laterality Date   ABDOMINAL AORTOGRAM N/A 11/19/2023   Procedure: ABDOMINAL AORTOGRAM;  Surgeon: Sheree Penne Bruckner, MD;  Location: [facility] INVASIVE CV LAB;  Service: Cardiovascular;  Laterality: N/A;   ABDOMINAL AORTOGRAM W/LOWER EXTREMITY Bilateral 08/05/2018   Procedure: ABDOMINAL AORTOGRAM W/LOWER EXTREMITY;  Surgeon: Court Dorn PARAS, MD;  Location: [facility] INVASIVE CV LAB;  Service: Cardiovascular;  Laterality: Bilateral;   ABDOMINAL AORTOGRAM W/LOWER EXTREMITY N/A 09/26/2021   Procedure: ABDOMINAL AORTOGRAM W/LOWER EXTREMITY;  Surgeon: Court Dorn PARAS, MD;  Location: [facility] INVASIVE CV LAB;  Service: Cardiovascular;  Laterality: N/A;   AORTOGRAM  04/24/2016  Abdominal aortogram, bilateral iliac angiogram, bifemoral runoff   CARDIAC CATHETERIZATION  05/12/2005   RCA   carotid doppler  01/24/2013   RICA endarterectomy,left CCA 0-49%; left bulb and prox ICA 50-69%; bilaateral subclavian < 50%   CAROTID ENDARTERECTOMY  11/01/2010   CATARACT EXTRACTION     COLONOSCOPY     CORONARY ANGIOPLASTY  05/18/2005   2 STENTS distal RCA AND PROXIMAL-MID RCA   5 total stents per pt.   CYSTOSCOPY/URETEROSCOPY/HOLMIUM LASER/STENT PLACEMENT Left 01/11/2018   Procedure: LEFT  URETEROSCOPY/HOLMIUM LASER/STENT PLACEMENT;  Surgeon: Cam Morene ORN, MD;  Location: [facility] ORS;  Service: Urology;  Laterality: Left;   DOPPLER ECHOCARDIOGRAPHY  02/07/2012   EF 55%,SHOWED NO ISCHEMIA    HERNIA REPAIR     umbilical   lower arterial  doppler  02/04/2013   aotra 1.5 x 1.5 cm; distal abd aorta 70-99%,proximal common iliac arteries -very stenotic with increased velocities>50%,may be falsely elevated as a result of residual plaque from the distal aorta stenosis   LOWER EXTREMITY ANGIOGRAPHY N/A 11/19/2023   Procedure: Lower Extremity Angiography;  Surgeon: Sheree Penne Bruckner, MD;  Location: [facility] INVASIVE CV LAB;  Service: Cardiovascular;  Laterality: N/A;   lower extremity doppler  June 18 ,2013   ABI'S ABNORMAL, RABI was 0.88 and LABI 0.75 ,with 3-vessel  run off   NM MYOVIEW  LTD  MAY 23,2011   showed no significant ischemia;   NM MYOVIEW  LTD  04/22/2008   ef 77%,exercise capcity 6 METS ,exaggerated blood pressure response to exercise   PERIPHERAL VASCULAR CATHETERIZATION N/A 04/24/2016   Procedure: Lower Extremity Angiography;  Surgeon: Dorn JINNY Lesches, MD;  Location: [facility] INVASIVE CV LAB;  Service: Cardiovascular;  Laterality: N/A;   PERIPHERAL VASCULAR CATHETERIZATION  04/24/2016   Procedure: Peripheral Vascular Intervention;  Surgeon: Dorn JINNY Lesches, MD;  Location: [facility] INVASIVE CV LAB;  Service: Cardiovascular;;  Aorta   retrograde central aortic catheterization  05/19/2005   cutting balloon atherectomy, c-circ stenosis with DES STENTING CYPHER   TONSILLECTOMY     TOTAL HIP ARTHROPLASTY Right 07/11/2022   Procedure: TOTAL HIP ARTHROPLASTY ANTERIOR APPROACH;  Surgeon: Ernie Cough, MD;  Location: [facility] OR[facility];  Service: Orthopedics;  Laterality: Right;   TOTAL KNEE ARTHROPLASTY Left 01/03/2019   Procedure: TOTAL KNEE ARTHROPLASTY;  Surgeon: Kay Kemps, MD;  Location: [facility] OR[facility];  Service: Orthopedics;  Laterality: Left;  with IS block   TRANSURETHRAL RESECTION OF PROSTATE N/A  09/08/2016   Procedure: TRANSURETHRAL RESECTION OF THE PROSTATE (TURP);  Surgeon: Morene ORN Cam, MD;  Location: [facility];  Service: Urology;  Laterality: N/A;   TRANSURETHRAL RESECTION OF PROSTATE     10-10-17  Dr. Cam   TRANSURETHRAL RESECTION OF PROSTATE N/A 10/10/2017   Procedure: TRANSURETHRAL RESECTION OF THE PROSTATE (TURP);  Surgeon: Cam Morene ORN, MD;  Location: [facility] OR[facility];  Service: Urology;  Laterality: N/A;   VASECTOMY         Scheduled Meds:  atorvastatin   40 mg Oral QPM   finasteride   5 mg Oral q AM   gabapentin   200 mg Oral TID   irbesartan   150 mg Oral Daily   melatonin  5 mg Oral QHS   metoprolol  tartrate  75 mg Oral BID   multivitamin with minerals  1 tablet Oral q AM   pantoprazole   40 mg Oral BID   polyethylene glycol  17 g Oral Daily   senna-docusate  2 tablet Oral QHS   tamsulosin   0.4 mg Oral QHS   Continuous Infusions:  amiodarone   60 mg/hr (07/12/24 0500)   amiodarone      azithromycin  Stopped (07/11/24 1554)   cefTRIAXone  (ROCEPHIN )  IV Stopped (07/11/24 1146)   diltiazem  (CARDIZEM ) infusion Stopped (07/12/24 0630)   heparin  1,000 Units/hr (07/12/24 0250)   sodium chloride  0.45 % 1,000 mL with potassium chloride  20 mEq infusion     PRN Meds:   Allergies:   Allergies[1]  Social History:   Social History   Socioeconomic History   Marital status: Married    Spouse name: Madeline   Number of children: 2   Years of education: BA   Highest education level: Not on file  Occupational History   Occupation: Retired  Tobacco Use   Smoking status: Former    Current packs/day: 0.00    Average packs/day: 2.0 packs/day for 20.0 years (40.0 ttl pk-yrs)    Types: Cigarettes    Start date: 07/31/1948    Quit date: 07/31/1968    Years since quitting: 55.9   Smokeless tobacco: Never  Vaping Use   Vaping status: Never Used  Substance and Sexual Activity   Alcohol  use: Not Currently    Alcohol /week: 4.0 standard drinks of alcohol     Types: 4 Glasses of  wine per week   Drug use: Never   Sexual activity: Not Currently  Other Topics Concern   Not on file  Social History Narrative   Lives with wife   Caffeine use: Decaf coffee, tea   Left handed   Social Drivers of Health   Tobacco Use: Medium Risk (07/10/2024)   Patient History    Smoking Tobacco Use: Former    Smokeless Tobacco Use: Never    Passive Exposure: Not on Actuary Strain: Low Risk (11/28/2022)   Overall Financial Resource Strain (CARDIA)    Difficulty of Paying Living Expenses: Not hard at all  Food Insecurity: Patient Unable To Answer (07/08/2024)   Epic    Worried About Programme Researcher, Broadcasting/film/video in the Last Year: Patient unable to answer    Ran Out of Food in the Last Year: Patient unable to answer  Transportation Needs: No Transportation Needs (09/26/2023)   PRAPARE - Administrator, Civil Service (Medical): No    Lack of Transportation (Non-Medical): No  Physical Activity: Insufficiently Active (11/28/2022)   Exercise Vital Sign    Days of Exercise per Week: 3 days    Minutes of Exercise per Session: 30 min  Stress: No Stress Concern Present (11/28/2022)   [employer] of Occupational Health - Occupational Stress Questionnaire    Feeling of Stress : Not at all  Social Connections: Unknown (07/08/2024)   Social Connection and Isolation Panel    Frequency of Communication with Friends and Family: Patient unable to answer    Frequency of Social Gatherings with Friends and Family: Not on file    Attends Religious Services: Patient unable to answer    Active Member of Clubs or Organizations: Not on file    Attends Banker Meetings: Not on file    Marital Status: Not on file  Intimate Partner Violence: Unknown (07/08/2024)   Epic    Fear of Current or Ex-Partner: Patient unable to answer    Emotionally Abused: Patient unable to answer    Physically Abused: Not on file    Sexually Abused: Patient unable to answer  Depression  (PHQ2-9): Low Risk (01/11/2024)   Depression (PHQ2-9)    PHQ-2 Score: 0  Alcohol  Screen: Low Risk (11/28/2022)   Alcohol  Screen  Last Alcohol  Screening Score (AUDIT): 0  Housing: Patient Unable To Answer (07/08/2024)   Epic    Unable to Pay for Housing in the Last Year: Patient unable to answer    Number of Times Moved in the Last Year: Not on file    Homeless in the Last Year: Patient unable to answer  Utilities: Not At Risk (09/26/2023)   AHC Utilities    Threatened with loss of utilities: No  Health Literacy: Not on file    Family History:   Family History  Problem Relation Age of Onset   Ovarian cancer Sister    Heart disease Father    Brain cancer Brother        Tumor     ROS:  Please see the history of present illness.       Physical Exam/Data: Vitals:   07/12/24 0630 07/12/24 0645 07/12/24 0717 07/12/24 0730  BP:  (!) 180/68  (!) 188/71  Pulse: 73 72  75  Resp: (!) 36 20  19  Temp:   98.5 F (36.9 C)   TempSrc:   Axillary   SpO2: 96% 99%  99%    Intake/Output Summary (Last 24 hours) at 07/12/2024 0912 Last data filed at 07/12/2024 9361 Gross per 24 hour  Intake 57.73 ml  Output --  Net 57.73 ml      07/07/2024    6:37 PM 07/07/2024    2:57 PM 06/20/2024    1:02 PM  Last 3 Weights  Weight (lbs) 180 lb 184 lb 188 lb  Weight (kg) 81.647 kg 83.462 kg 85.276 kg     There is no height or weight on file to calculate BMI.   Physical Exam per MD  EKG:  The following EKGs were reviewed: - Initial EKG on 07/11/2024 showed normal sinus rhythm, rate 73 bpm, with non-specific T wave changes. - EKG on 07/12/2024 at 00:54 showed atrial fibrillation, rate 147 bpm with diffuse ST depression. - EKG on 07/12/2024 at 6:32 showed atrial fibrillation, rate 80 bpm, with improvement in ST depressions and non-specific T wave changes.    Relevant CV Studies:  Echocardiogram 09/14/2023: Impressions: 1. Left ventricular ejection fraction, by estimation, is 60 to 65%. Left   ventricular ejection fraction by 3D volume is 63 %. The left ventricle has  normal function. The left ventricle has no regional wall motion  abnormalities. There is mild concentric  left ventricular hypertrophy. Left ventricular diastolic parameters are  consistent with Grade I diastolic dysfunction (impaired relaxation). The  average left ventricular global longitudinal strain is -24.2 %. The global  longitudinal strain is normal.   2. Right ventricular systolic function is normal. The right ventricular  size is normal.   3. Left atrial size was mildly dilated.   4. The mitral valve is normal in structure. Trivial mitral valve  regurgitation. No evidence of mitral stenosis.   5. The aortic valve is tricuspid. There is mild calcification of the  aortic valve. Aortic valve regurgitation is not visualized. Aortic valve  sclerosis/calcification is present, without any evidence of aortic  stenosis.   6. The inferior vena cava is normal in size with <50% respiratory  variability, suggesting right atrial pressure of 8 mmHg.    Laboratory Data: High Sensitivity Troponin:  No results for input(s): TROPONINIHS in the last 720 hours. No results for input(s): TRNPT in the last 720 hours.    Chemistry Recent Labs  Lab 07/10/24 0432 07/11/24 0250 07/11/24 0510 07/12/24 0206  NA 146*  147* 152* 151*  K 3.7 3.8 3.5 3.6  CL 114* 116*  --  120*  CO2 23 21*  --  19*  GLUCOSE 139* 138*  --  164*  BUN 38* 31*  --  23  CREATININE 1.58* 1.46*  --  1.34*  CALCIUM  8.5* 8.8*  --  9.1  MG  --   --   --  2.1  GFRNONAA 41* 45*  --  50*  ANIONGAP 9 10  --  12    Recent Labs  Lab 07/09/24 0333 07/10/24 0432 07/11/24 0250  PROT 6.2* 6.7 7.2  ALBUMIN 2.2* 2.3* 2.6*  AST 47* 35 32  ALT 57* 52* 48*  ALKPHOS 121 117 122  BILITOT 0.5 0.4 0.4   Lipids No results for input(s): CHOL, TRIG, HDL, LABVLDL, LDLCALC, CHOLHDL in the last 168 hours.  Hematology Recent Labs  Lab  07/09/24 0333 07/11/24 0250 07/11/24 0510 07/12/24 0206  WBC 7.9 11.6*  --  11.6*  RBC 3.45* 3.89*  --  3.78*  HGB 10.0* 11.5* 9.5* 11.2*  HCT 31.2* 35.7* 28.0* 35.2*  MCV 90.4 91.8  --  93.1  MCH 29.0 29.6  --  29.6  MCHC 32.1 32.2  --  31.8  RDW 13.5 13.8  --  14.0  PLT 228 269  --  252   Thyroid  No results for input(s): TSH, FREET4 in the last 168 hours.  BNP Recent Labs  Lab 07/12/24 0206  BNP 600.5*    DDimer No results for input(s): DDIMER in the last 168 hours.  Radiology/Studies:  CT HEAD WO CONTRAST ( ) Result Date: 07/11/2024 EXAM: CT HEAD WITHOUT 07/11/2024 01:14:00 PM TECHNIQUE: CT of the head was performed without the administration of intravenous contrast. Automated exposure control, iterative reconstruction, and/or weight based adjustment of the mA/kV was utilized to reduce the radiation dose to as low as reasonably achievable. COMPARISON: 07/07/2024 CLINICAL HISTORY: Altered mental status, nontraumatic (Ped 0-17y) FINDINGS: BRAIN AND VENTRICLES: No acute intracranial hemorrhage. No mass effect or midline shift. No extra-axial fluid collection. No evidence of acute infarct. No hydrocephalus. Stable age-related atrophy and moderate chronic ischemic small vessel disease. Moderate calcific atheromatous disease. ORBITS: No acute abnormality. Status post bilateral lens replacement. SINUSES AND MASTOIDS: No acute abnormality. SOFT TISSUES AND SKULL: No acute skull fracture. No acute soft tissue abnormality. IMPRESSION: 1. No acute intracranial abnormality. Electronically signed by: Donnice Mania MD 07/11/2024 01:32 PM EST RP Workstation: [id number]   DG Chest Port 1 View Result Date: 07/11/2024 EXAM: 1 VIEW(S) XRAY OF THE CHEST 07/11/2024 03:16:00 AM COMPARISON: CXR 07/07/2024, CT Chest 03/01/2023, CT A/P 07/07/2024. CLINICAL HISTORY: Altered mental state. FINDINGS: LUNGS AND PLEURA: New small left pleural effusion versus left base airspace opacity. Increased  interstitial markings. No pneumothorax. HEART AND MEDIASTINUM: Unchanged cardiomediastinal silhouette. Atherosclerotic calcifications. BONES AND SOFT TISSUES: No acute osseous abnormality. IMPRESSION: 1. New small left pleural effusion versus left basilar airspace opacity. 2. Increased interstitial markings. Electronically signed by: Morgane Naveau MD 07/11/2024 03:30 AM EST RP Workstation: [id number]   US  RENAL Result Date: 07/08/2024 CLINICAL DATA:  Acute kidney injury. EXAM: RENAL / URINARY TRACT ULTRASOUND COMPLETE COMPARISON:  Abdomen and pelvis CT dated 07/07/2024. FINDINGS: Right Kidney: Renal measurements: 11.8 x 6.8 x 6.1 cm = volume: 256 mL. Echogenicity within normal limits. No mass or hydronephrosis visualized. Left Kidney: Renal measurements: 9.6 x 5.2 x 5.1 cm = volume: 132 mL. Diffuse cortical thinning. Echogenicity within normal limits. No mass or hydronephrosis visualized. Bladder: Appears normal  for degree of bladder distention. Other: The examination was limited by patient body habitus and lack of cooperation. IMPRESSION: 1. No acute abnormality. 2. Left renal atrophy. Electronically Signed   By: Elspeth Bathe M.D.   On: 07/08/2024 15:18     Assessment and Plan:  Assessment/ Plan per MD.  Risk Assessment/Risk Scores:   CHA2DS2-VASc Score = 4  his indicates a 4.8% annual risk of stroke. The patient's score is based upon: CHF History: 0 HTN History: 1 Diabetes History: 0 Stroke History: 0 Vascular Disease History: 1 Age Score: 2 Gender Score: 0    For questions or updates, please contact [facility] Please consult [email or web address] for contact info under    Signed, Callie E Goodrich, PA-C  07/12/2024 9:12 AM     [1]  Allergies Allergen Reactions   Lisinopril Cough   Niaspan [Niacin Er (Antihyperlipidemic)] Rash

## 2024-07-12 NOTE — ED Notes (Signed)
 Pt went into SVT with heart rate increasing to 160s. Pt remains altered, reacts to painful stimuli  Skin warm to touch. Charge made aware. Dr. Alfornia paged. ED attending made aware.

## 2024-07-12 NOTE — Progress Notes (Signed)
 PHARMACY - ANTICOAGULATION CONSULT NOTE  Pharmacy Consult for Heparin  Indication: atrial fibrillation  Allergies[1]  Patient Measurements:    Vital Signs: Temp: 98.5 F (36.9 C) (12/13 0717) Temp Source: Axillary (12/13 0717) BP: 188/71 (12/13 0730) Pulse Rate: 75 (12/13 0730)  Labs: Recent Labs    07/10/24 0432 07/11/24 0250 07/11/24 0250 07/11/24 0510 07/12/24 0206 07/12/24 1000  HGB  --  11.5*   < > 9.5* 11.2*  --   HCT  --  35.7*  --  28.0* 35.2*  --   PLT  --  269  --   --  252  --   APTT  --   --   --   --   --  51*  HEPARINUNFRC  --   --   --   --   --  >1.10*  CREATININE 1.58* 1.46*  --   --  1.34*  --    < > = values in this interval not displayed.    Estimated Creatinine Clearance: 34.6 mL/min (A) (by C-G formula based on SCr of 1.34 mg/dL (H)).   Medical History: Past Medical History:  Diagnosis Date   AAA (abdominal aortic aneurysm)    asymptomatic   AAA (abdominal aortic aneurysm) 2010   peripheral  angiogram-- bilateral SFA DISEASE  and 60 to 70% infrarenaal abd. aortic stenosis with 15 -mm gradient   Adenomatous colon polyp 11/2003   CAD (coronary artery disease)    Gait abnormality 02/13/2017   Gastroparesis    pt denies   GERD (gastroesophageal reflux disease)    w/ LPR   History of kidney stones    x2   Hyperlipidemia    Hypertension    IDA (iron  deficiency anemia)    Peripheral arterial disease    RCEA  by Dr Medford Brunswick   PUD (peptic ulcer disease)    pt unaware   Vertigo     Medications:  Scheduled:   atorvastatin   40 mg Oral QPM   finasteride   5 mg Oral q AM   gabapentin   200 mg Oral TID   hydrALAZINE   10 mg Intravenous Q6H   melatonin  5 mg Oral QHS   multivitamin with minerals  1 tablet Oral q AM   pantoprazole   40 mg Oral BID   polyethylene glycol  17 g Oral Daily   senna-docusate  2 tablet Oral QHS   tamsulosin   0.4 mg Oral QHS    Assessment: 89 y.o. male with h/o Afib, Eliquis  on hold, for heparin   Initial  aPTT subtherapeutic at 51 seconds on 1000 units/hr, Anti-Xa level elevated as expected with recent Eliquis  dose  Goal of Therapy:  aPTT 66-102 sec Heparin  level 0.3-0.7 units/ml Monitor platelets by anticoagulation protocol: Yes   Plan:  Increase heparin  gtt to 1200 units/hr F/u 8 hour aPTT/HL   Dorn Poot, PharmD, [facility] Clinical Pharmacist ED Pharmacist Phone # [phone] 07/12/2024 10:51 AM       [1]  Allergies Allergen Reactions   Lisinopril Cough   Niaspan [Niacin Er (Antihyperlipidemic)] Rash

## 2024-07-12 NOTE — ED Notes (Addendum)
MD Rathore at bedside  °

## 2024-07-12 NOTE — ED Notes (Signed)
 Pt is severely altered at this time and cannot take PO meds.  Family states that at baseline the pt is A&O x 4 and can joke around.  Pt is not verbalizing at all at this time.  Only moans and mumbles. a

## 2024-07-12 NOTE — ED Notes (Signed)
 Per Cesario, MD, run diltiazem  drip and amiodarone  drip for 1hr and then d/c diltiazem .

## 2024-07-12 NOTE — ED Notes (Addendum)
 Alfornia, MD and Cesario, MD notified via secure chat of pts HR continuing to stay between 130-180. Awaiting for a response from MD. No new orders at this time.

## 2024-07-12 NOTE — Progress Notes (Signed)
 PROGRESS NOTE    Nathan Santiago [facility]  FMW:[id number] DOB: 01/13/1931 DOA: 07/11/2024 PCP: Charlanne Fredia CROME, MD   Brief Narrative:  HPI: Nathan Santiago is a 89 y.o. male with medical history significant of CAD, PAF, AAA, PAD, HTN, HLD,  and recent admission last week from 12/8-12/11 for acute cystitis c/b AKI (s/p IV CTX x4 days; Cr down from 3.18-->1.5) p/w acute encephalopathy iso hypertensive emergency c/b LLL CAP.   Pt is unable to provide a medical history. From what I can obtain from Epic review ,and wife at bedside, the pt returned to his facility yesterday and on examination by the provider at the facility was sent back to the ED as pt was poorly responsive. Per wife, pt presented with similar sx early last week, but got progressively better prior to discharge yesterday; thus, his current acute decline is unclear.   In the ED, pt hypertensive (SBP 190s). Labs notable for Na 147-->152, Cr 1.46, and WBC 11.6. CXR showed new small left pleural effusion versus left basilar airspace opacity. EDP requested medicine admission for acute encephalopathy c/b LLL CAP.  Assessment & Plan:   Principal Problem:   Acute encephalopathy  Hypertensive emergency: -Started on IV labetalol  10mg  q6h scheduled and will start him on as needed hydralazine  as well since the blood pressure still elevated. -PTA amlodipine  10mg  daily, clonidine  0.1 mg 3 times daily, finasteride  5 mg, hydralazine  50 mg 4 times daily and irbesartan  150mg  daily (pta valsartan  120mg  daily), resume all of those medications once he passes the swallow.    Acute encephalopathy likely secondary to LLL CAP, POA: Possible aspiration given poor mentation; chest x-ray was clear during recent hospitalization.  Patient has been started on Rocephin  and Zithromax , SLP consulted.  Patient is not hypoxic.  With patient not following commands and garbled speech, I am concerned about possible stroke as well.  Will order MRI brain.   PAF now with  RVR: -PTA metoprolol  and apixaban  which were held, patient went into RVR overnight, night hospitalist discussed with cardiology, initially patient received Cardizem  but then transitioned to amiodarone , still in A-fib but rates under 70, has been started on heparin .  Cardiology on board and managing.  Hypernatremia with mild metabolic acidosis: Sodium upon discharge was 146 but upon admission this time was 152 which has been stable since yesterday.  CO2 dropping to 19.  Suspecting hypovolemic hyponatremia.  Will gently hydrate with half-normal saline.  Oral candidiasis: Patient has thick whitish cheesy material on the tongue consistent with oral candidiasis.  Waiting for swallow evaluation to be completed, if able to swallow, will start on nystatin swish and swallow.  If not, will start on IV Diflucan .   HLD -PTA atorvastatin  40mg  daily   GERD -PTA pantoprazole    BPH -PTA finasteride  5mg  daily and Flomax  0.4mg  daily  GOC: Patient with recurrent hospitalizations due to acute encephalopathy, looks completely different than how he was 2 days ago when he was fully alert and oriented with no complaints.  He is already DNR but his prognosis appears to be poor and I am concerned that he may decline.  Will consult palliative care to have detailed GOC conversations.  DVT prophylaxis: Heparin    Code Status: Limited: Do not attempt resuscitation (DNR) -DNR-LIMITED -Do Not Intubate/DNI   Family Communication: Wife present at bedside.  Plan of care discussed with patient in length and he/she verbalized understanding and agreed with it.  Status is: Inpatient Remains inpatient appropriate because: Patient encephalopathic with pneumonia and A-fib  with RVR.   Estimated body mass index is 30.9 kg/m as calculated from the following:   Height as of 07/07/24: 5' 4 (1.626 m).   Weight as of 07/07/24: 81.6 kg.    Nutritional Assessment: There is no height or weight on file to calculate BMI.. Seen by  dietician.  I agree with the assessment and plan as outlined below: Nutrition Status:        . Skin Assessment: I have examined the patient's skin and I agree with the wound assessment as performed by the wound care RN as outlined below:    Consultants:  Cardiology  Procedures:  None  Antimicrobials:  Anti-infectives (From admission, onward)    Start     Dose/Rate Route Frequency Ordered Stop   07/11/24 1300  azithromycin  (ZITHROMAX ) 500 mg in sodium chloride  0.9 % 250 mL IVPB        500 mg 250 mL/hr over 60 Minutes Intravenous Every 24 hours 07/11/24 1254 07/14/24 0959   07/11/24 1000  cefTRIAXone  (ROCEPHIN ) 1 g in sodium chloride  0.9 % 100 mL IVPB        1 g 200 mL/hr over 30 Minutes Intravenous Every 24 hours 07/11/24 0957 07/16/24 0959   07/11/24 1000  azithromycin  (ZITHROMAX ) tablet 500 mg  Status:  Discontinued        500 mg Oral Daily 07/11/24 0957 07/11/24 1254   07/11/24 0345  vancomycin  (VANCOCIN ) IVPB 1000 mg/200 mL premix  Status:  Discontinued        1,000 mg 200 mL/hr over 60 Minutes Intravenous  Once 07/11/24 0338 07/11/24 0342   07/11/24 0345  ceFEPIme  (MAXIPIME ) 2 g in sodium chloride  0.9 % 100 mL IVPB        2 g 200 mL/hr over 30 Minutes Intravenous  Once 07/11/24 0338 07/11/24 0456   07/11/24 0345  vancomycin  (VANCOREADY) IVPB 1500 mg/300 mL        1,500 mg 150 mL/hr over 120 Minutes Intravenous  Once 07/11/24 0342 07/11/24 0704         Subjective: Seen and examined.  Although he is fully alert but he is totally confused, not following commands, has garbled speech.  Wife confirmed that he was fully alert and oriented when he was discharged and even when he arrived to the facility but he became confused the same night.  Objective: Vitals:   07/12/24 0630 07/12/24 0645 07/12/24 0717 07/12/24 0730  BP:  (!) 180/68  (!) 188/71  Pulse: 73 72  75  Resp: (!) 36 20  19  Temp:   98.5 F (36.9 C)   TempSrc:   Axillary   SpO2: 96% 99%  99%     Intake/Output Summary (Last 24 hours) at 07/12/2024 0804 Last data filed at 07/12/2024 9361 Gross per 24 hour  Intake 57.73 ml  Output --  Net 57.73 ml   There were no vitals filed for this visit.  Examination:  General exam: Appears calm and comfortable, awake but totally confused. Respiratory system: Unable to listen to the lung sounds because patient keeps trying to talk and making upper respiratory sounds. Cardiovascular system: S1 & S2 heard, RRR. No JVD, murmurs, rubs, gallops or clicks. No pedal edema. Gastrointestinal system: Abdomen is nondistended, soft and nontender. No organomegaly or masses felt. Normal bowel sounds heard. Central nervous system: Alert but confused.  Not following commands.  Data Reviewed: I have personally reviewed following labs and imaging studies  CBC: Recent Labs  Lab 07/07/24 1836 07/08/24 0347 07/09/24  9666 07/11/24 0250 07/11/24 0510 07/12/24 0206  WBC 7.7 6.2 7.9 11.6*  --  11.6*  NEUTROABS 5.4  --   --  9.7*  --   --   HGB 11.1* 9.7* 10.0* 11.5* 9.5* 11.2*  HCT 34.2* 29.5* 31.2* 35.7* 28.0* 35.2*  MCV 90.5 91.0 90.4 91.8  --  93.1  PLT 237 186 228 269  --  252   Basic Metabolic Panel: Recent Labs  Lab 07/08/24 0347 07/09/24 0333 07/10/24 0432 07/11/24 0250 07/11/24 0510 07/12/24 0206  NA 139 144 146* 147* 152* 151*  K 4.3 4.0 3.7 3.8 3.5 3.6  CL 108 113* 114* 116*  --  120*  CO2 14* 23 23 21*  --  19*  GLUCOSE 154* 168* 139* 138*  --  164*  BUN 73* 60* 38* 31*  --  23  CREATININE 2.72* 2.62* 1.58* 1.46*  --  1.34*  CALCIUM  8.3* 8.3* 8.5* 8.8*  --  9.1  MG  --   --   --   --   --  2.1   GFR: Estimated Creatinine Clearance: 34.6 mL/min (A) (by C-G formula based on SCr of 1.34 mg/dL (H)). Liver Function Tests: Recent Labs  Lab 07/07/24 1836 07/08/24 0347 07/09/24 0333 07/10/24 0432 07/11/24 0250  AST 84* 55* 47* 35 32  ALT 62* 56* 57* 52* 48*  ALKPHOS 141* 121 121 117 122  BILITOT 1.6* 0.6 0.5 0.4 0.4  PROT  7.0 6.2* 6.2* 6.7 7.2  ALBUMIN 2.7* 2.2* 2.2* 2.3* 2.6*   No results for input(s): LIPASE, AMYLASE in the last 168 hours. Recent Labs  Lab 07/07/24 2006 07/11/24 0251  AMMONIA 23 16   Coagulation Profile: No results for input(s): INR, PROTIME in the last 168 hours. Cardiac Enzymes: No results for input(s): CKTOTAL, CKMB, CKMBINDEX, TROPONINI in the last 168 hours. BNP (last 3 results) No results for input(s): PROBNP in the last 8760 hours. HbA1C: No results for input(s): HGBA1C in the last 72 hours. CBG: No results for input(s): GLUCAP in the last 168 hours. Lipid Profile: No results for input(s): CHOL, HDL, LDLCALC, TRIG, CHOLHDL, LDLDIRECT in the last 72 hours. Thyroid  Function Tests: No results for input(s): TSH, T4TOTAL, FREET4, T3FREE, THYROIDAB in the last 72 hours. Anemia Panel: No results for input(s): VITAMINB12, FOLATE, FERRITIN, TIBC, IRON , RETICCTPCT in the last 72 hours. Sepsis Labs: Recent Labs  Lab 07/11/24 9667  LATICACIDVEN 0.9    Recent Results (from the past 240 hours)  Resp panel by RT-PCR (RSV, Flu A&B, Covid) Anterior Nasal Swab     Status: None   Collection Time: 07/07/24  8:06 PM   Specimen: Anterior Nasal Swab  Result Value Ref Range Status   SARS Coronavirus 2 by RT PCR NEGATIVE NEGATIVE Final   Influenza A by PCR NEGATIVE NEGATIVE Final   Influenza B by PCR NEGATIVE NEGATIVE Final    Comment: (NOTE) The Xpert Xpress SARS-CoV-2/FLU/RSV plus assay is intended as an aid in the diagnosis of influenza from Nasopharyngeal swab specimens and should not be used as a sole basis for treatment. Nasal washings and aspirates are unacceptable for Xpert Xpress SARS-CoV-2/FLU/RSV testing.  Fact Sheet for Patients: bloggercourse.com  Fact Sheet for Healthcare Providers: seriousbroker.it  This test is not yet approved or cleared by the [place]   FDA and has been authorized for detection and/or diagnosis of SARS-CoV-2 by FDA under an Emergency Use Authorization (EUA). This EUA will remain in effect (meaning this test can be used) for the  duration of the COVID-19 declaration under Section 564(b)(1) of the Act, 21 U.S.C. section 360bbb-3(b)(1), unless the authorization is terminated or revoked.     Resp Syncytial Virus by PCR NEGATIVE NEGATIVE Final    Comment: (NOTE) Fact Sheet for Patients: bloggercourse.com  Fact Sheet for Healthcare Providers: seriousbroker.it  This test is not yet approved or cleared by the [place]  FDA and has been authorized for detection and/or diagnosis of SARS-CoV-2 by FDA under an Emergency Use Authorization (EUA). This EUA will remain in effect (meaning this test can be used) for the duration of the COVID-19 declaration under Section 564(b)(1) of the Act, 21 U.S.C. section 360bbb-3(b)(1), unless the authorization is terminated or revoked.  Performed at [facility], [address], [place], [place] [address]   Urine Culture (for pregnant, neutropenic or urologic patients or patients with an indwelling urinary catheter)     Status: None   Collection Time: 07/07/24 11:38 PM   Specimen: Urine, Clean Catch  Result Value Ref Range Status   Specimen Description URINE, CLEAN CATCH  Final   Special Requests NONE  Final   Culture   Final    NO GROWTH Performed at [facility], [address] [place], [place] [address]    Report Status 07/09/2024 FINAL  Final  Culture, blood (Routine X 2) w Reflex to ID Panel     Status: None (Preliminary result)   Collection Time: 07/11/24  2:51 AM   Specimen: BLOOD  Result Value Ref Range Status   Specimen Description BLOOD RIGHT ANTECUBITAL  Final   Special Requests   Final    BOTTLES DRAWN AEROBIC AND ANAEROBIC Blood Culture results may not be optimal due to an inadequate volume of blood  received in culture bottles   Culture   Final    NO GROWTH < 12 HOURS Performed at [facility], [address] [place], [place] [address]    Report Status PENDING  Incomplete  Resp panel by RT-PCR (RSV, Flu A&B, Covid) Anterior Nasal Swab     Status: None   Collection Time: 07/11/24  2:52 AM   Specimen: Anterior Nasal Swab  Result Value Ref Range Status   SARS Coronavirus 2 by RT PCR NEGATIVE NEGATIVE Final   Influenza A by PCR NEGATIVE NEGATIVE Final   Influenza B by PCR NEGATIVE NEGATIVE Final    Comment: (NOTE) The Xpert Xpress SARS-CoV-2/FLU/RSV plus assay is intended as an aid in the diagnosis of influenza from Nasopharyngeal swab specimens and should not be used as a sole basis for treatment. Nasal washings and aspirates are unacceptable for Xpert Xpress SARS-CoV-2/FLU/RSV testing.  Fact Sheet for Patients: bloggercourse.com  Fact Sheet for Healthcare Providers: seriousbroker.it  This test is not yet approved or cleared by the [place]  FDA and has been authorized for detection and/or diagnosis of SARS-CoV-2 by FDA under an Emergency Use Authorization (EUA). This EUA will remain in effect (meaning this test can be used) for the duration of the COVID-19 declaration under Section 564(b)(1) of the Act, 21 U.S.C. section 360bbb-3(b)(1), unless the authorization is terminated or revoked.     Resp Syncytial Virus by PCR NEGATIVE NEGATIVE Final    Comment: (NOTE) Fact Sheet for Patients: bloggercourse.com  Fact Sheet for Healthcare Providers: seriousbroker.it  This test is not yet approved or cleared by the [place]  FDA and has been authorized for detection and/or diagnosis of SARS-CoV-2 by FDA under an Emergency Use Authorization (EUA). This EUA will remain in  effect (meaning this test can be used) for the duration of the COVID-19 declaration under Section  564(b)(1) of the Act, 21 U.S.C. section 360bbb-3(b)(1), unless the authorization is terminated or revoked.  Performed at [facility], [address], [place], [place] [address]   Culture, blood (Routine X 2) w Reflex to ID Panel     Status: None (Preliminary result)   Collection Time: 07/11/24  2:56 AM   Specimen: BLOOD  Result Value Ref Range Status   Specimen Description BLOOD LEFT ANTECUBITAL  Final   Special Requests   Final    BOTTLES DRAWN AEROBIC AND ANAEROBIC Blood Culture results may not be optimal due to an inadequate volume of blood received in culture bottles   Culture  Setup Time   Final    GRAM POSITIVE COCCI IN BOTH AEROBIC AND ANAEROBIC BOTTLES CRITICAL RESULT CALLED TO, READ BACK BY AND VERIFIED WITH: PHARMD JSABRA OZIIQNMI [id number] @0029  FH Performed at [facility], [address] [place], [place] [address]    Culture GRAM POSITIVE COCCI  Final   Report Status PENDING  Incomplete  Blood Culture ID Panel (Reflexed)     Status: Abnormal   Collection Time: 07/11/24  2:56 AM  Result Value Ref Range Status   Enterococcus faecalis NOT DETECTED NOT DETECTED Final   Enterococcus Faecium NOT DETECTED NOT DETECTED Final   Listeria monocytogenes NOT DETECTED NOT DETECTED Final   Staphylococcus species DETECTED (A) NOT DETECTED Final    Comment: CRITICAL RESULT CALLED TO, READ BACK BY AND VERIFIED WITH: PHARMD J. OZIQNMI [id number] @0029  FH    Staphylococcus aureus (BCID) NOT DETECTED NOT DETECTED Final   Staphylococcus epidermidis DETECTED (A) NOT DETECTED Final    Comment: Methicillin (oxacillin) resistant coagulase negative staphylococcus. Possible blood culture contaminant (unless isolated from more than one blood culture draw or clinical case suggests pathogenicity). No antibiotic treatment is indicated for blood  culture contaminants. CRITICAL RESULT CALLED TO, READ BACK BY AND VERIFIED WITH: PHARMD J. OZIQNMI [id number] @0029  FH    Staphylococcus lugdunensis NOT  DETECTED NOT DETECTED Final   Streptococcus species NOT DETECTED NOT DETECTED Final   Streptococcus agalactiae NOT DETECTED NOT DETECTED Final   Streptococcus pneumoniae NOT DETECTED NOT DETECTED Final   Streptococcus pyogenes NOT DETECTED NOT DETECTED Final   A.calcoaceticus-baumannii NOT DETECTED NOT DETECTED Final   Bacteroides fragilis NOT DETECTED NOT DETECTED Final   Enterobacterales NOT DETECTED NOT DETECTED Final   Enterobacter cloacae complex NOT DETECTED NOT DETECTED Final   Escherichia coli NOT DETECTED NOT DETECTED Final   Klebsiella aerogenes NOT DETECTED NOT DETECTED Final   Klebsiella oxytoca NOT DETECTED NOT DETECTED Final   Klebsiella pneumoniae NOT DETECTED NOT DETECTED Final   Proteus species NOT DETECTED NOT DETECTED Final   Salmonella species NOT DETECTED NOT DETECTED Final   Serratia marcescens NOT DETECTED NOT DETECTED Final   Haemophilus influenzae NOT DETECTED NOT DETECTED Final   Neisseria meningitidis NOT DETECTED NOT DETECTED Final   Pseudomonas aeruginosa NOT DETECTED NOT DETECTED Final   Stenotrophomonas maltophilia NOT DETECTED NOT DETECTED Final   Candida albicans NOT DETECTED NOT DETECTED Final   Candida auris NOT DETECTED NOT DETECTED Final   Candida glabrata NOT DETECTED NOT DETECTED Final   Candida krusei NOT DETECTED NOT DETECTED Final   Candida parapsilosis NOT DETECTED NOT DETECTED Final   Candida tropicalis NOT DETECTED NOT DETECTED Final   Cryptococcus neoformans/gattii NOT DETECTED NOT DETECTED Final   Methicillin resistance mecA/C DETECTED (A) NOT  DETECTED Final    Comment: CRITICAL RESULT CALLED TO, READ BACK BY AND VERIFIED WITH: PHARMD JSABRA OZIQNMI [id number] @0029  FH Performed at [facility], [address] [place], [place] [address]      Radiology Studies: CT HEAD WO CONTRAST ( ) Result Date: 07/11/2024 EXAM: CT HEAD WITHOUT 07/11/2024 01:14:00 PM TECHNIQUE: CT of the head was performed without the administration of intravenous  contrast. Automated exposure control, iterative reconstruction, and/or weight based adjustment of the mA/kV was utilized to reduce the radiation dose to as low as reasonably achievable. COMPARISON: 07/07/2024 CLINICAL HISTORY: Altered mental status, nontraumatic (Ped 0-17y) FINDINGS: BRAIN AND VENTRICLES: No acute intracranial hemorrhage. No mass effect or midline shift. No extra-axial fluid collection. No evidence of acute infarct. No hydrocephalus. Stable age-related atrophy and moderate chronic ischemic small vessel disease. Moderate calcific atheromatous disease. ORBITS: No acute abnormality. Status post bilateral lens replacement. SINUSES AND MASTOIDS: No acute abnormality. SOFT TISSUES AND SKULL: No acute skull fracture. No acute soft tissue abnormality. IMPRESSION: 1. No acute intracranial abnormality. Electronically signed by: Donnice Mania MD 07/11/2024 01:32 PM EST RP Workstation: [id number]   DG Chest Port 1 View Result Date: 07/11/2024 EXAM: 1 VIEW(S) XRAY OF THE CHEST 07/11/2024 03:16:00 AM COMPARISON: CXR 07/07/2024, CT Chest 03/01/2023, CT A/P 07/07/2024. CLINICAL HISTORY: Altered mental state. FINDINGS: LUNGS AND PLEURA: New small left pleural effusion versus left base airspace opacity. Increased interstitial markings. No pneumothorax. HEART AND MEDIASTINUM: Unchanged cardiomediastinal silhouette. Atherosclerotic calcifications. BONES AND SOFT TISSUES: No acute osseous abnormality. IMPRESSION: 1. New small left pleural effusion versus left basilar airspace opacity. 2. Increased interstitial markings. Electronically signed by: Morgane Naveau MD 07/11/2024 03:30 AM EST RP Workstation: [id number]    Scheduled Meds:  atorvastatin   40 mg Oral QPM   finasteride   5 mg Oral q AM   gabapentin   200 mg Oral TID   irbesartan   150 mg Oral Daily   melatonin  5 mg Oral QHS   metoprolol  tartrate  75 mg Oral BID   multivitamin with minerals  1 tablet Oral q AM   pantoprazole   40 mg Oral BID    polyethylene glycol  17 g Oral Daily   senna-docusate  2 tablet Oral QHS   tamsulosin   0.4 mg Oral QHS   Continuous Infusions:  amiodarone  60 mg/hr (07/12/24 0500)   amiodarone      azithromycin  Stopped (07/11/24 1554)   cefTRIAXone  (ROCEPHIN )  IV Stopped (07/11/24 1146)   diltiazem  (CARDIZEM ) infusion Stopped (07/12/24 0630)   heparin  1,000 Units/hr (07/12/24 0250)     LOS: 1 day   Fredia Skeeter, MD [facility]  07/12/2024, 8:04 AM   *Please note that this is a verbal dictation therefore any spelling or grammatical errors are due to the Dragon Medical One system interpretation.  Please page via Amion and do not message via secure chat for urgent patient care matters. Secure chat can be used for non urgent patient care matters.  How to contact the [facility] Attending or Consulting provider 7A - 7P or covering provider during after hours 7P -7A, for this patient?  Check the care team in [facility] and look for a) attending/consulting [facility] provider listed and b) the [facility] team listed. Page or secure chat 7A-7P. Log into [email or web address] and use [facility] universal password to access. If you do not have the password, please contact the hospital operator. Locate the [facility] provider you are looking for under [facility] and page to a number that you can be directly reached.  If you still have difficulty reaching the provider, please page the [facility] (Director on Call) for the Hospitalists listed on amion for assistance.

## 2024-07-12 NOTE — ED Provider Notes (Signed)
 This is a 89 year old male currently boarding in the emergency room admitted to medicine.  I was called to bedside for tachycardia, it appears he has been admitted for sepsis secondary to Staph aureus.  He is DNR limited.  Initially thought to be SVT but follow-up telemetry monitoring shows A-fib RVR.  Will treat with IV fluids.  Still pending recommendations of inpatient team at this time.   Jerral Meth, MD 07/12/24 [phone]

## 2024-07-12 NOTE — Progress Notes (Signed)
 PHARMACY - PHYSICIAN COMMUNICATION CRITICAL VALUE ALERT - BLOOD CULTURE IDENTIFICATION (BCID)  Nathan Santiago is an 89 y.o. male who presented to [facility] on 07/11/2024    Name of physician (or Provider) Contacted: Dr. Alfornia  Current antibiotics: Ceftriaxone , Azithromycin   Changes to prescribed antibiotics recommended:  No changes  Results for orders placed or performed during the hospital encounter of 07/11/24  Blood Culture ID Panel (Reflexed) (Collected: 07/11/2024  2:56 AM)  Result Value Ref Range   Enterococcus faecalis NOT DETECTED NOT DETECTED   Enterococcus Faecium NOT DETECTED NOT DETECTED   Listeria monocytogenes NOT DETECTED NOT DETECTED   Staphylococcus species DETECTED (A) NOT DETECTED   Staphylococcus aureus (BCID) NOT DETECTED NOT DETECTED   Staphylococcus epidermidis DETECTED (A) NOT DETECTED   Staphylococcus lugdunensis NOT DETECTED NOT DETECTED   Streptococcus species NOT DETECTED NOT DETECTED   Streptococcus agalactiae NOT DETECTED NOT DETECTED   Streptococcus pneumoniae NOT DETECTED NOT DETECTED   Streptococcus pyogenes NOT DETECTED NOT DETECTED   A.calcoaceticus-baumannii NOT DETECTED NOT DETECTED   Bacteroides fragilis NOT DETECTED NOT DETECTED   Enterobacterales NOT DETECTED NOT DETECTED   Enterobacter cloacae complex NOT DETECTED NOT DETECTED   Escherichia coli NOT DETECTED NOT DETECTED   Klebsiella aerogenes NOT DETECTED NOT DETECTED   Klebsiella oxytoca NOT DETECTED NOT DETECTED   Klebsiella pneumoniae NOT DETECTED NOT DETECTED   Proteus species NOT DETECTED NOT DETECTED   Salmonella species NOT DETECTED NOT DETECTED   Serratia marcescens NOT DETECTED NOT DETECTED   Haemophilus influenzae NOT DETECTED NOT DETECTED   Neisseria meningitidis NOT DETECTED NOT DETECTED   Pseudomonas aeruginosa NOT DETECTED NOT DETECTED   Stenotrophomonas maltophilia NOT DETECTED NOT DETECTED   Candida albicans NOT DETECTED NOT DETECTED   Candida auris NOT  DETECTED NOT DETECTED   Candida glabrata NOT DETECTED NOT DETECTED   Candida krusei NOT DETECTED NOT DETECTED   Candida parapsilosis NOT DETECTED NOT DETECTED   Candida tropicalis NOT DETECTED NOT DETECTED   Cryptococcus neoformans/gattii NOT DETECTED NOT DETECTED   Methicillin resistance mecA/C DETECTED (A) NOT DETECTED    Clair Agent 07/12/2024  12:58 AM

## 2024-07-13 ENCOUNTER — Encounter (HOSPITAL_COMMUNITY): Payer: Self-pay | Admitting: Hospitalist

## 2024-07-13 DIAGNOSIS — G9340 Encephalopathy, unspecified: Secondary | ICD-10-CM | POA: Diagnosis not present

## 2024-07-13 LAB — URINALYSIS, W/ REFLEX TO CULTURE (INFECTION SUSPECTED)
Bilirubin Urine: NEGATIVE
Glucose, UA: 150 mg/dL — AB
Ketones, ur: 5 mg/dL — AB
Leukocytes,Ua: NEGATIVE
Nitrite: NEGATIVE
Protein, ur: 30 mg/dL — AB
Specific Gravity, Urine: 1.014 (ref 1.005–1.030)
pH: 5 (ref 5.0–8.0)

## 2024-07-13 LAB — CULTURE, BLOOD (ROUTINE X 2)

## 2024-07-13 LAB — PROCALCITONIN: Procalcitonin: 0.21 ng/mL

## 2024-07-13 LAB — CBC
HCT: 35.9 % — ABNORMAL LOW (ref 39.0–52.0)
Hemoglobin: 11.6 g/dL — ABNORMAL LOW (ref 13.0–17.0)
MCH: 29.3 pg (ref 26.0–34.0)
MCHC: 32.3 g/dL (ref 30.0–36.0)
MCV: 90.7 fL (ref 80.0–100.0)
Platelets: 269 K/uL (ref 150–400)
RBC: 3.96 MIL/uL — ABNORMAL LOW (ref 4.22–5.81)
RDW: 14.2 % (ref 11.5–15.5)
WBC: 17.6 K/uL — ABNORMAL HIGH (ref 4.0–10.5)
nRBC: 0 % (ref 0.0–0.2)

## 2024-07-13 LAB — BASIC METABOLIC PANEL WITH GFR
Anion gap: 14 (ref 5–15)
BUN: 15 mg/dL (ref 8–23)
CO2: 20 mmol/L — ABNORMAL LOW (ref 22–32)
Calcium: 8.7 mg/dL — ABNORMAL LOW (ref 8.9–10.3)
Chloride: 115 mmol/L — ABNORMAL HIGH (ref 98–111)
Creatinine, Ser: 1.25 mg/dL — ABNORMAL HIGH (ref 0.61–1.24)
GFR, Estimated: 54 mL/min — ABNORMAL LOW (ref >60)
Glucose, Bld: 178 mg/dL — ABNORMAL HIGH (ref 70–99)
Potassium: 3.4 mmol/L — ABNORMAL LOW (ref 3.5–5.1)
Sodium: 149 mmol/L — ABNORMAL HIGH (ref 135–145)

## 2024-07-13 LAB — HEPARIN LEVEL (UNFRACTIONATED): Heparin Unfractionated: >1.1 [IU]/mL — ABNORMAL HIGH (ref 0.30–0.70)

## 2024-07-13 LAB — APTT: aPTT: 64 s — ABNORMAL HIGH (ref 24–36)

## 2024-07-13 LAB — GLUCOSE, CAPILLARY
Glucose-Capillary: 173 mg/dL — ABNORMAL HIGH (ref 70–99)
Glucose-Capillary: 160 mg/dL — ABNORMAL HIGH (ref 70–99)
Glucose-Capillary: 149 mg/dL — ABNORMAL HIGH (ref 70–99)

## 2024-07-13 LAB — LACTIC ACID, PLASMA: Lactic Acid, Venous: 1.3 mmol/L (ref 0.5–1.9)

## 2024-07-13 LAB — MAGNESIUM: Magnesium: 2.3 mg/dL (ref 1.7–2.4)

## 2024-07-13 MED ORDER — FUROSEMIDE 10 MG/ML IJ SOLN
20.0000 mg | Freq: Once | INTRAMUSCULAR | Status: AC
  Administered 2024-07-13: 20 mg via INTRAVENOUS
  Filled 2024-07-13: qty 2

## 2024-07-13 MED ORDER — POTASSIUM CHLORIDE CRYS ER 20 MEQ PO TBCR: 40.0000 meq | EXTENDED_RELEASE_TABLET | Freq: Once | ORAL | Status: DC

## 2024-07-13 MED ORDER — DILTIAZEM HCL 25 MG/5ML IV SOLN
10.0000 mg | Freq: Once | INTRAVENOUS | Status: AC
  Administered 2024-07-13: 10 mg via INTRAVENOUS
  Filled 2024-07-13: qty 5

## 2024-07-13 MED ORDER — AMIODARONE HCL IN DEXTROSE 360-4.14 MG/200ML-% IV SOLN: 30.0000 mg/h | INTRAVENOUS | Status: DC

## 2024-07-13 MED ORDER — AMIODARONE LOAD VIA INFUSION
150.0000 mg | Freq: Once | INTRAVENOUS | Status: AC
  Administered 2024-07-13: 150 mg via INTRAVENOUS
  Filled 2024-07-13: qty 83.34

## 2024-07-13 MED ORDER — NITROGLYCERIN 2 % TD OINT
1.0000 [in_us] | TOPICAL_OINTMENT | Freq: Four times a day (QID) | TRANSDERMAL | Status: DC
  Administered 2024-07-13 – 2024-07-14 (×5): 1 [in_us] via TOPICAL
  Filled 2024-07-13: qty 30

## 2024-07-13 MED ORDER — ACETAMINOPHEN 650 MG RE SUPP: 650.0000 mg | Freq: Four times a day (QID) | RECTAL | Status: DC | PRN

## 2024-07-13 MED ORDER — AMIODARONE HCL IN DEXTROSE 360-4.14 MG/200ML-% IV SOLN
30.0000 mg/h | INTRAVENOUS | Status: DC
  Administered 2024-07-13 – 2024-07-14 (×2): 30 mg/h via INTRAVENOUS
  Filled 2024-07-13 (×2): qty 200

## 2024-07-13 MED ORDER — MAGNESIUM SULFATE 2 GM/50ML IV SOLN
2.0000 g | Freq: Once | INTRAVENOUS | Status: AC
  Administered 2024-07-13: 2 g via INTRAVENOUS
  Filled 2024-07-13: qty 50

## 2024-07-13 MED ORDER — AMIODARONE IV BOLUS ONLY 150 MG/100ML: 150.0000 mg | Freq: Once | INTRAVENOUS | Status: DC

## 2024-07-13 MED ORDER — AMIODARONE HCL IN DEXTROSE 360-4.14 MG/200ML-% IV SOLN: 60.0000 mg/h | INTRAVENOUS | Status: DC

## 2024-07-13 MED ORDER — LORAZEPAM 2 MG/ML IJ SOLN
0.5000 mg | Freq: Once | INTRAMUSCULAR | Status: AC | PRN
  Administered 2024-07-14: 10:00:00 0.5 mg via INTRAVENOUS
  Filled 2024-07-13: qty 1

## 2024-07-13 MED ORDER — POTASSIUM CHLORIDE 10 MEQ/100ML IV SOLN
10.0000 meq | INTRAVENOUS | Status: AC
  Administered 2024-07-13 (×4): 10 meq via INTRAVENOUS
  Filled 2024-07-13 (×4): qty 100

## 2024-07-13 MED ORDER — INSULIN ASPART 100 UNIT/ML IJ SOLN
0.0000 [IU] | INTRAMUSCULAR | Status: DC
  Administered 2024-07-13 – 2024-07-14 (×3): 1 [IU] via SUBCUTANEOUS
  Administered 2024-07-14: 13:00:00 2 [IU] via SUBCUTANEOUS
  Administered 2024-07-14 (×2): 1 [IU] via SUBCUTANEOUS
  Filled 2024-07-13 (×5): qty 1
  Filled 2024-07-13: qty 2

## 2024-07-13 MED ORDER — CLONIDINE HCL 0.2 MG/24HR TD PTWK
0.2000 mg | MEDICATED_PATCH | TRANSDERMAL | Status: DC
  Administered 2024-07-13: 0.2 mg via TRANSDERMAL
  Filled 2024-07-13: qty 1

## 2024-07-13 MED ORDER — HYDRALAZINE HCL 20 MG/ML IJ SOLN
20.0000 mg | INTRAMUSCULAR | Status: DC
  Administered 2024-07-13 – 2024-07-14 (×6): 20 mg via INTRAVENOUS
  Filled 2024-07-13 (×6): qty 1

## 2024-07-13 MED ORDER — ENOXAPARIN SODIUM 80 MG/0.8ML IJ SOSY
80.0000 mg | PREFILLED_SYRINGE | Freq: Two times a day (BID) | INTRAMUSCULAR | Status: DC
  Administered 2024-07-13 – 2024-07-14 (×3): 80 mg via SUBCUTANEOUS
  Filled 2024-07-13 (×3): qty 0.8

## 2024-07-13 MED ORDER — HYDRALAZINE HCL 20 MG/ML IJ SOLN: 20.0000 mg | INTRAMUSCULAR | Status: DC | PRN

## 2024-07-13 MED ORDER — AMIODARONE LOAD VIA INFUSION: 150.0000 mg | Freq: Once | INTRAVENOUS | Status: DC

## 2024-07-13 MED ORDER — PIPERACILLIN-TAZOBACTAM 3.375 G IVPB
3.3750 g | Freq: Three times a day (TID) | INTRAVENOUS | Status: DC
  Administered 2024-07-13 – 2024-07-14 (×5): 3.375 g via INTRAVENOUS
  Filled 2024-07-13 (×5): qty 50

## 2024-07-13 MED ORDER — POTASSIUM CHLORIDE 10 MEQ/100ML IV SOLN
10.0000 meq | INTRAVENOUS | Status: AC
  Administered 2024-07-13 (×3): 10 meq via INTRAVENOUS
  Filled 2024-07-13 (×3): qty 100

## 2024-07-13 MED ORDER — OFLOXACIN 0.3 % OP SOLN
1.0000 [drp] | Freq: Four times a day (QID) | OPHTHALMIC | Status: DC
  Administered 2024-07-13 – 2024-07-14 (×4): 1 [drp] via OPHTHALMIC
  Filled 2024-07-13: qty 5

## 2024-07-13 NOTE — Progress Notes (Addendum)
 PHARMACY - ANTICOAGULATION CONSULT NOTE  Pharmacy Consult for Heparin  to Lovenox  Indication: atrial fibrillation  Allergies[1]  Patient Measurements: Heparin  DW: 76.3  Vital Signs: Temp: 99.4 F (37.4 C) (12/14 0700) Temp Source: Axillary (12/14 0700) BP: 166/82 (12/14 0700) Pulse Rate: 142 (12/14 0700)  Labs: Recent Labs    07/11/24 0250 07/11/24 0510 07/12/24 0206 07/12/24 1000 07/12/24 1943 07/13/24 0610  HGB 11.5* 9.5* 11.2*  --   --  11.6*  HCT 35.7* 28.0* 35.2*  --   --  35.9*  PLT 269  --  252  --   --  269  APTT  --   --   --  51* 78* 64*  HEPARINUNFRC  --   --   --  >1.10* >1.10* >1.10*  CREATININE 1.46*  --  1.34*  --  1.20  --     Estimated Creatinine Clearance: 38.7 mL/min (by C-G formula based on SCr of 1.2 mg/dL).   Medical History: Past Medical History:  Diagnosis Date   AAA (abdominal aortic aneurysm)    asymptomatic   AAA (abdominal aortic aneurysm) 2010   peripheral  angiogram-- bilateral SFA DISEASE  and 60 to 70% infrarenaal abd. aortic stenosis with 15 -mm gradient   Adenomatous colon polyp 11/2003   CAD (coronary artery disease)    Gait abnormality 02/13/2017   Gastroparesis    pt denies   GERD (gastroesophageal reflux disease)    w/ LPR   History of kidney stones    x2   Hyperlipidemia    Hypertension    IDA (iron  deficiency anemia)    Peripheral arterial disease    RCEA  by Dr Medford Brunswick   PUD (peptic ulcer disease)    pt unaware   Vertigo     Medications:  Scheduled:   atorvastatin   40 mg Oral QPM   finasteride   5 mg Oral q AM   gabapentin   200 mg Oral TID   hydrALAZINE   10 mg Intravenous Q6H   melatonin  5 mg Oral QHS   multivitamin with minerals  1 tablet Oral q AM   pantoprazole   40 mg Oral BID   polyethylene glycol  17 g Oral Daily   senna-docusate  2 tablet Oral QHS   tamsulosin   0.4 mg Oral QHS    Assessment: 89 y.o. male with h/o Afib, Eliquis  on hold, for heparin   12/14 AM: aPTT slightly  subtherapeutic at 64 seconds on 1200 units/hr. Anti-Xa level continues to be elevated as expected with recent Eliquis  dose. Per RN, no interruptions to infusion or signs/symptoms of bleeding. CBC stable - Hgb 11.6, Platelets 269.   12/14 AM update: Transitioning from heparin  to lovenox .   Goal of Therapy:  aPTT 66-102 sec Heparin  level 0.3-0.7 units/ml Monitor platelets by anticoagulation protocol: Yes   Plan:  Stop heparin  infusion Start Lovenox  80mg  every 12 hours CBC daily  Dionicia Canavan, PharmD, RPh PGY1 Acute Care Pharmacy Resident [facility]  07/13/2024 7:07 AM          [1]  Allergies Allergen Reactions   Lisinopril Cough   Niaspan [Niacin Er (Antihyperlipidemic)] Rash

## 2024-07-13 NOTE — Progress Notes (Signed)
 Patient HR sustaining to 150s after having a cough for a while. Page MD Alfornia. Awaiting response.

## 2024-07-13 NOTE — Progress Notes (Signed)
 PT Cancellation Note  Patient Details Name: Nathan Santiago MRN: [id number] DOB: [birth year]   Cancelled Treatment:    Reason Eval/Treat Not Completed: Medical issues which prohibited therapy. Pt tachycardic at rest (HR 140s at rest) and aPTT slightly subtherapeutic. Will reattempt when pt is medically appropriate.  Isaiah DEL. Jerin Franzel, PT, DPT  [employer] 07/13/2024, 9:20 AM

## 2024-07-13 NOTE — Progress Notes (Signed)
 Pharmacy Antibiotic Note  Nathan Santiago is a 89 y.o. male admitted on 07/11/2024 with sepsis.  Pharmacy has been consulted for Zosyn  dosing. Linezolid  was started 12/13. Patient completed course of azithromycin  and ceftriaxone .  Plan: Zosyn  3.375g IV q8h (4 hour infusion).  Height: 5' 4.02 (162.6 cm) Weight: 81.6 kg (179 lb 14.3 oz) IBW/kg (Calculated) : 59.24  Temp (24hrs), Avg:99.4 F (37.4 C), Min:98.9 F (37.2 C), Max:100 F (37.8 C)  Recent Labs  Lab 07/08/24 0347 07/09/24 0333 07/10/24 0432 07/11/24 0250 07/11/24 0332 07/12/24 0206 07/12/24 1943 07/13/24 0610  WBC 6.2 7.9  --  11.6*  --  11.6*  --  17.6*  CREATININE 2.72* 2.62* 1.58* 1.46*  --  1.34* 1.20 1.25*  LATICACIDVEN  --   --   --   --  0.9  --   --   --     Estimated Creatinine Clearance: 37.1 mL/min (A) (by C-G formula based on SCr of 1.25 mg/dL (H)).    Allergies[1]  Antimicrobials this admission: Azithromycin  12/12 >> 12/14 Ceftriaxone  12/12 >> 12/14 Linezolid  12/13 >>    Microbiology results: 12/12 Blood Cultures: BCID - MRSE 2/4 12/13 Blood Cultures: Pending   Dionicia Canavan, PharmD, RPh PGY1 Acute Care Pharmacy Resident [facility]  07/13/2024 10:22 AM      [1]  Allergies Allergen Reactions   Lisinopril Cough   Niaspan [Niacin Er (Antihyperlipidemic)] Rash

## 2024-07-13 NOTE — Progress Notes (Signed)
 Rounding Note   Patient Name: Nathan Santiago [facility] Date of Encounter: 07/13/2024  [facility] Cardiologist: Dorn Lesches, MD   Subjective Remains confused and does not respond to questions.  Scheduled Meds:  atorvastatin   40 mg Oral QPM   enoxaparin  (LOVENOX ) injection  80 mg Subcutaneous Q12H   finasteride   5 mg Oral q AM   hydrALAZINE   10 mg Intravenous Q6H   melatonin  5 mg Oral QHS   multivitamin with minerals  1 tablet Oral q AM   polyethylene glycol  17 g Oral Daily   senna-docusate  2 tablet Oral QHS   tamsulosin   0.4 mg Oral QHS   Continuous Infusions:  azithromycin  Stopped (07/12/24 1133)   diltiazem  (CARDIZEM ) infusion 7.5 mg/hr (07/13/24 0839)   fluconazole  (DIFLUCAN ) IV Stopped (07/12/24 1638)   linezolid  (ZYVOX ) IV 600 mg (07/12/24 1650)   piperacillin -tazobactam (ZOSYN )  IV 3.375 g (07/13/24 0837)   potassium chloride  10 mEq (07/13/24 0836)   PRN Meds: acetaminophen , hydrALAZINE , LORazepam    Vital Signs  Vitals:   07/13/24 0525 07/13/24 0600 07/13/24 0700 07/13/24 0729  BP: (!) 161/99 (!) 167/87 (!) 166/82 (!) 158/102  Pulse: (!) 141 (!) 146 (!) 142 (!) 140  Resp: (!) 29 (!) 28 18 20   Temp: 99.4 F (37.4 C)  99.4 F (37.4 C) 99 F (37.2 C)  TempSrc: Axillary  Axillary Axillary  SpO2: 92%  95% 94%  Weight:   81.6 kg   Height:   5' 4.02 (1.626 m)     Intake/Output Summary (Last 24 hours) at 07/13/2024 0944 Last data filed at 07/13/2024 0603 Gross per 24 hour  Intake 1406.89 ml  Output 1175 ml  Net 231.89 ml      07/13/2024    7:00 AM 07/07/2024    6:37 PM 07/07/2024    2:57 PM  Last 3 Weights  Weight (lbs) 179 lb 14.3 oz 180 lb 184 lb  Weight (kg) 81.6 kg 81.647 kg 83.462 kg      Telemetry Atrial fibrillation converting to sinus - Personally Reviewed   Physical Exam  GEN: Confused Neck: Supple Cardiac: RRR Respiratory: CTA GI: Soft, mildly distended MS: No edema. Neuro: Encephalopathic, moves all extremities Psych:  Not assessed due to confusion.  Labs    Chemistry Recent Labs  Lab 07/09/24 [phone] 07/10/24 0432 07/11/24 0250 07/11/24 0510 07/12/24 0206 07/12/24 1943 07/13/24 0610 07/13/24 0803  NA 144 146* 147*   < > 151* 152* 149*  --   K 4.0 3.7 3.8   < > 3.6 3.3* 3.4*  --   CL 113* 114* 116*  --  120* 117* 115*  --   CO2 23 23 21*  --  19* 21* 20*  --   GLUCOSE 168* 139* 138*  --  164* 180* 178*  --   BUN 60* 38* 31*  --  23 19 15   --   CREATININE 2.62* 1.58* 1.46*  --  1.34* 1.20 1.25*  --   CALCIUM  8.3* 8.5* 8.8*  --  9.1 8.8* 8.7*  --   MG  --   --   --   --  2.1  --   --  2.3  PROT 6.2* 6.7 7.2  --   --   --   --   --   ALBUMIN 2.2* 2.3* 2.6*  --   --   --   --   --   AST 47* 35 32  --   --   --   --   --  ALT 57* 52* 48*  --   --   --   --   --   ALKPHOS 121 117 122  --   --   --   --   --   BILITOT 0.5 0.4 0.4  --   --   --   --   --   GFRNONAA 22* 41* 45*  --  50* 57* 54*  --   ANIONGAP 8 9 10   --  12 14 14   --    < > = values in this interval not displayed.     Hematology Recent Labs  Lab 07/11/24 0250 07/11/24 0510 07/12/24 0206 07/13/24 0610  WBC 11.6*  --  11.6* 17.6*  RBC 3.89*  --  3.78* 3.96*  HGB 11.5* 9.5* 11.2* 11.6*  HCT 35.7* 28.0* 35.2* 35.9*  MCV 91.8  --  93.1 90.7  MCH 29.6  --  29.6 29.3  MCHC 32.2  --  31.8 32.3  RDW 13.8  --  14.0 14.2  PLT 269  --  252 269    BNP Recent Labs  Lab 07/12/24 0206  BNP 600.5*      Radiology  CT HEAD WO CONTRAST ( ) Result Date: 07/11/2024 EXAM: CT HEAD WITHOUT 07/11/2024 01:14:00 PM TECHNIQUE: CT of the head was performed without the administration of intravenous contrast. Automated exposure control, iterative reconstruction, and/or weight based adjustment of the mA/kV was utilized to reduce the radiation dose to as low as reasonably achievable. COMPARISON: 07/07/2024 CLINICAL HISTORY: Altered mental status, nontraumatic (Ped 0-17y) FINDINGS: BRAIN AND VENTRICLES: No acute intracranial hemorrhage. No mass  effect or midline shift. No extra-axial fluid collection. No evidence of acute infarct. No hydrocephalus. Stable age-related atrophy and moderate chronic ischemic small vessel disease. Moderate calcific atheromatous disease. ORBITS: No acute abnormality. Status post bilateral lens replacement. SINUSES AND MASTOIDS: No acute abnormality. SOFT TISSUES AND SKULL: No acute skull fracture. No acute soft tissue abnormality. IMPRESSION: 1. No acute intracranial abnormality. Electronically signed by: Donnice Mania MD 07/11/2024 01:32 PM EST RP Workstation: [id number]     Patient Profile   89 year old male with past medical history of coronary artery disease, paroxysmal atrial fibrillation, peripheral vascular disease, carotid artery disease, abdominal aortic aneurysm, hypertension, hyperlipidemia, anemia for evaluation of atrial fibrillation. Has had previous PCI of his right coronary and left circumflex. Last echocardiogram February 2025 showed normal LV function, mild left ventricular hypertrophy, grade 1 diastolic dysfunction, mild left atrial enlargement. Patient discharged on December 11 after being admitted with acute encephalopathy felt secondary to dehydration and urinary tract infection. He was treated with antibiotics and his renal function improved and he was ultimately discharged. However he again developed confusion yesterday and was brought back to the emergency room.   Assessment & Plan  1 paroxysmal atrial fibrillation-patient converted to sinus rhythm in the emergency room with amiodarone ; then developed recurrent atrial fibrillation but has converted back to sinus rhythm.  Will continue IV Cardizem .  If he has recurrent atrial fibrillation would resume IV amiodarone .  Continue Lovenox .  Resume apixaban  once mental status improves.     2 hypertensive urgency- blood pressure remains elevated.  He is unable to take his oral medications due to encephalopathy.  Continue Cardizem , hydralazine ; clonidine   and Nitropaste also added.  Follow blood pressure and adjust as needed.   3 encephalopathy-etiology unclear; question secondary to pneumonia.  Antibiotics per primary care.    4 history of coronary artery disease-resume statin when able.  For questions or updates,  please contact [facility] Please consult [email or web address] for contact info under      Signed, Redell Shallow, MD  07/13/2024, 9:44 AM

## 2024-07-13 NOTE — Progress Notes (Signed)
 PROGRESS NOTE    Nathan Santiago [facility]  FMW:[id number] DOB: [birth year] DOA: 07/11/2024 PCP: Charlanne Fredia CROME, MD   Chief Complaint  Patient presents with   Altered Mental Status    Brief Narrative:    Nathan Santiago is a 89 y.o. male with medical history significant of CAD, PAF, AAA, PAD, HTN, HLD, wheelchair dependent, SNF resident at [facility], discharged 07/10/2024, he was admitted 07/07/2024 due to acute encephalopathy secondary to UTI, he was discharged in stable condition and mentation back at baseline as discussed with his wife at bedside.  -Patient presents to ED secondary to confusion, altered mental status, multiple electrolyte derangement, concern for sepsis and hypertensive urgency.  As well A-fib with RVR, workup significant for left lower lobe pneumonia.  Assessment & Plan:   Principal Problem:   Acute encephalopathy   Hypertensive emergency - Blood pressure significantly elevated.   - Currently cannot take any oral medications . - He is currently on Cardizem  drip, max dose for A-fib with RVR, but now heart rate has improved likely will need to discontinue, then I would consider nitro drip -PTA amlodipine  10mg  daily, clonidine  0.1 mg 3 times daily, finasteride  5 mg, hydralazine  50 mg 4 times daily and irbesartan  150mg  daily (pta valsartan  120mg  daily), resume all of those medications once he passes the swallow(or has coretrak inserted)   Acute encephalopathy . - multifactorial, in the setting of electrolyte derangement including hyponatremia, as well infectious process/sepsis, and hypertensive emergency.  - CT head with no acute findings, will need MRI brain when medically stable  Sepsis, POA Left lower lobe pneumonia 1/2 bottles gram-positive bacteremia -Sepsis POA, the setting of leukocytosis, tachypnea, tachycardia and encephalopathy- -will broaden antibiotic coverage to cover for HCAP, continue with IV Zyvox  and Zosyn  (broadened to Zosyn  to cover for  possible aspiration pneumonia, as well he had Pseudomonas UTI last February), as we will continue with azithromycin  -Following sputum cultures, Legionella strep pneumonia antigen. - Will obtain UA -Follow on repeat blood cultures, growing staph 1/2 sets, possible contamination.    PAF now with RVR: -On amiodarone  drip, Cardizem  drip, heart rate significantly uncontrolled. - This morning converted to sinus, much improved. -Cardiology input greatly appreciated, continue with amiodarone  drip to help maintain in sinus rhythm. - Eliquis  on hold due to n.p.o. status, on heparin  drip, will transition to Lovenox  due to difficult IV access  CKD stage IIIa - With IV fluids, avoid nephrotoxic medications  Hyperglycemia -Continue with insulin  sliding scale   Hypernatremia  Metabolic acidosis  - Volume depletion dehydration . - Continue with IV fluids .  Hypokalemia -Replaced   Oral thrush - Continue with Diflucan  for now. - Unable to take oral ,will start Magic mouthwash when he is able to take oral.    HLD -PTA atorvastatin  40mg  daily   GERD -PTA pantoprazole , currently IV   BPH -PTA finasteride  5mg  daily and Flomax  0.4mg  daily   GOC:  - Palliative medicine has been consulted, I have discussed with son and wife at bedside, for now we will continue current measures including antibiotics, fluids and medical management, hoping for improvement but if lack of improvement or rapid deterioration then will need to talk about goals of care.     The patient is critically ill with multi-organ failure.  Critical care was necessary to treat or prevent imminent or life-threatening deterioration of sepsis, hypertensive emergency, and acute encephalopathy,  and was exclusive of separately billable procedures and treating other patients. Total critical care time spent by  me: 40 minutes Time spent personally by me on obtaining history from patient or surrogate, evaluation of the patient, evaluation  of patient's response to treatment, ordering and review of laboratory studies, ordering and review of radiographic studies, ordering and performing treatments and interventions, and re-evaluation of the patient's condition. DVT prophylaxis: Lovenox / Code Status: (DNR) Family Communication: (discussed with wife and son at bedside) Disposition:   Status is: Inpatient   Consultants:  Cardiology  Subjective:  Patient is altered, cannot provide any complaints, staff he was persistently tachycardic overnight  Objective: Vitals:   07/13/24 0525 07/13/24 0600 07/13/24 0700 07/13/24 0729  BP: (!) 161/99 (!) 167/87 (!) 166/82 (!) 158/102  Pulse: (!) 141 (!) 146 (!) 142 (!) 140  Resp: (!) 29 (!) 28 18 20   Temp: 99.4 F (37.4 C)  99.4 F (37.4 C) 99 F (37.2 C)  TempSrc: Axillary  Axillary Axillary  SpO2: 92%  95% 94%  Weight:   81.6 kg   Height:   5' 4.02 (1.626 m)     Intake/Output Summary (Last 24 hours) at 07/13/2024 1057 Last data filed at 07/13/2024 0603 Gross per 24 hour  Intake 1406.89 ml  Output 1175 ml  Net 231.89 ml   Filed Weights   07/13/24 0700  Weight: 81.6 kg    Examination:  General exam: Appears calm and comfortable  Respiratory system: Clear to auscultation. Respiratory effort normal. Cardiovascular system: S1 & S2 heard, RRR. No JVD, murmurs, rubs, gallops or clicks. No pedal edema. Gastrointestinal system: Abdomen is nondistended, soft and nontender. No organomegaly or masses felt. Normal bowel sounds heard. Central nervous system: Alert and oriented. No focal neurological deficits. Extremities: Symmetric 5 x 5 power. Skin: No rashes, lesions or ulcers Psychiatry: Judgement and insight appear normal. Mood & affect appropriate.     Data Reviewed: I have personally reviewed following labs and imaging studies  CBC: Recent Labs  Lab 07/07/24 1836 07/08/24 0347 07/09/24 0333 07/11/24 0250 07/11/24 0510 07/12/24 0206 07/13/24 0610  WBC 7.7 6.2  7.9 11.6*  --  11.6* 17.6*  NEUTROABS 5.4  --   --  9.7*  --   --   --   HGB 11.1* 9.7* 10.0* 11.5* 9.5* 11.2* 11.6*  HCT 34.2* 29.5* 31.2* 35.7* 28.0* 35.2* 35.9*  MCV 90.5 91.0 90.4 91.8  --  93.1 90.7  PLT 237 186 228 269  --  252 269    Basic Metabolic Panel: Recent Labs  Lab 07/10/24 0432 07/11/24 0250 07/11/24 0510 07/12/24 0206 07/12/24 1943 07/13/24 0610 07/13/24 0803  NA 146* 147* 152* 151* 152* 149*  --   K 3.7 3.8 3.5 3.6 3.3* 3.4*  --   CL 114* 116*  --  120* 117* 115*  --   CO2 23 21*  --  19* 21* 20*  --   GLUCOSE 139* 138*  --  164* 180* 178*  --   BUN 38* 31*  --  23 19 15   --   CREATININE 1.58* 1.46*  --  1.34* 1.20 1.25*  --   CALCIUM  8.5* 8.8*  --  9.1 8.8* 8.7*  --   MG  --   --   --  2.1  --   --  2.3    GFR: Estimated Creatinine Clearance: 37.1 mL/min (A) (by C-G formula based on SCr of 1.25 mg/dL (H)).  Liver Function Tests: Recent Labs  Lab 07/07/24 1836 07/08/24 0347 07/09/24 0333 07/10/24 0432 07/11/24 0250  AST 84* 55* 47* 35  32  ALT 62* 56* 57* 52* 48*  ALKPHOS 141* 121 121 117 122  BILITOT 1.6* 0.6 0.5 0.4 0.4  PROT 7.0 6.2* 6.2* 6.7 7.2  ALBUMIN 2.7* 2.2* 2.2* 2.3* 2.6*    CBG: No results for input(s): GLUCAP in the last 168 hours.   Recent Results (from the past 240 hours)  Resp panel by RT-PCR (RSV, Flu A&B, Covid) Anterior Nasal Swab     Status: None   Collection Time: 07/07/24  8:06 PM   Specimen: Anterior Nasal Swab  Result Value Ref Range Status   SARS Coronavirus 2 by RT PCR NEGATIVE NEGATIVE Final   Influenza A by PCR NEGATIVE NEGATIVE Final   Influenza B by PCR NEGATIVE NEGATIVE Final    Comment: (NOTE) The Xpert Xpress SARS-CoV-2/FLU/RSV plus assay is intended as an aid in the diagnosis of influenza from Nasopharyngeal swab specimens and should not be used as a sole basis for treatment. Nasal washings and aspirates are unacceptable for Xpert Xpress SARS-CoV-2/FLU/RSV testing.  Fact Sheet for  Patients: bloggercourse.com  Fact Sheet for Healthcare Providers: seriousbroker.it  This test is not yet approved or cleared by the [place]  FDA and has been authorized for detection and/or diagnosis of SARS-CoV-2 by FDA under an Emergency Use Authorization (EUA). This EUA will remain in effect (meaning this test can be used) for the duration of the COVID-19 declaration under Section 564(b)(1) of the Act, 21 U.S.C. section 360bbb-3(b)(1), unless the authorization is terminated or revoked.     Resp Syncytial Virus by PCR NEGATIVE NEGATIVE Final    Comment: (NOTE) Fact Sheet for Patients: bloggercourse.com  Fact Sheet for Healthcare Providers: seriousbroker.it  This test is not yet approved or cleared by the [place]  FDA and has been authorized for detection and/or diagnosis of SARS-CoV-2 by FDA under an Emergency Use Authorization (EUA). This EUA will remain in effect (meaning this test can be used) for the duration of the COVID-19 declaration under Section 564(b)(1) of the Act, 21 U.S.C. section 360bbb-3(b)(1), unless the authorization is terminated or revoked.  Performed at [facility], [address], [place], [place] [address]   Urine Culture (for pregnant, neutropenic or urologic patients or patients with an indwelling urinary catheter)     Status: None   Collection Time: 07/07/24 11:38 PM   Specimen: Urine, Clean Catch  Result Value Ref Range Status   Specimen Description URINE, CLEAN CATCH  Final   Special Requests NONE  Final   Culture   Final    NO GROWTH Performed at [facility], [address] [place], [place] [address]    Report Status 07/09/2024 FINAL  Final  Culture, blood (Routine X 2) w Reflex to ID Panel     Status: None (Preliminary result)   Collection Time: 07/11/24  2:51 AM   Specimen: BLOOD  Result Value Ref Range Status    Specimen Description BLOOD RIGHT ANTECUBITAL  Final   Special Requests   Final    BOTTLES DRAWN AEROBIC AND ANAEROBIC Blood Culture results may not be optimal due to an inadequate volume of blood received in culture bottles   Culture   Final    NO GROWTH 2 DAYS Performed at [facility], [address], [place], [place] [address]    Report Status PENDING  Incomplete  Resp panel by RT-PCR (RSV, Flu A&B, Covid) Anterior Nasal Swab     Status: None   Collection Time: 07/11/24  2:52 AM   Specimen:  Anterior Nasal Swab  Result Value Ref Range Status   SARS Coronavirus 2 by RT PCR NEGATIVE NEGATIVE Final   Influenza A by PCR NEGATIVE NEGATIVE Final   Influenza B by PCR NEGATIVE NEGATIVE Final    Comment: (NOTE) The Xpert Xpress SARS-CoV-2/FLU/RSV plus assay is intended as an aid in the diagnosis of influenza from Nasopharyngeal swab specimens and should not be used as a sole basis for treatment. Nasal washings and aspirates are unacceptable for Xpert Xpress SARS-CoV-2/FLU/RSV testing.  Fact Sheet for Patients: bloggercourse.com  Fact Sheet for Healthcare Providers: seriousbroker.it  This test is not yet approved or cleared by the [place]  FDA and has been authorized for detection and/or diagnosis of SARS-CoV-2 by FDA under an Emergency Use Authorization (EUA). This EUA will remain in effect (meaning this test can be used) for the duration of the COVID-19 declaration under Section 564(b)(1) of the Act, 21 U.S.C. section 360bbb-3(b)(1), unless the authorization is terminated or revoked.     Resp Syncytial Virus by PCR NEGATIVE NEGATIVE Final    Comment: (NOTE) Fact Sheet for Patients: bloggercourse.com  Fact Sheet for Healthcare Providers: seriousbroker.it  This test is not yet approved or cleared by the [place]  FDA and has been authorized for detection and/or  diagnosis of SARS-CoV-2 by FDA under an Emergency Use Authorization (EUA). This EUA will remain in effect (meaning this test can be used) for the duration of the COVID-19 declaration under Section 564(b)(1) of the Act, 21 U.S.C. section 360bbb-3(b)(1), unless the authorization is terminated or revoked.  Performed at [facility], [address] [place], [place] [address]   Culture, blood (Routine X 2) w Reflex to ID Panel     Status: None (Preliminary result)   Collection Time: 07/11/24  2:56 AM   Specimen: BLOOD  Result Value Ref Range Status   Specimen Description BLOOD LEFT ANTECUBITAL  Final   Special Requests   Final    BOTTLES DRAWN AEROBIC AND ANAEROBIC Blood Culture results may not be optimal due to an inadequate volume of blood received in culture bottles   Culture  Setup Time   Final    GRAM POSITIVE COCCI IN BOTH AEROBIC AND ANAEROBIC BOTTLES CRITICAL RESULT CALLED TO, READ BACK BY AND VERIFIED WITH: PHARMD J. LEDDFORD [id number] @0029  FH    Culture   Final    GRAM POSITIVE COCCI IDENTIFICATION TO FOLLOW Performed at [facility], [address] [place], [place] [address]    Report Status PENDING  Incomplete  Blood Culture ID Panel (Reflexed)     Status: Abnormal   Collection Time: 07/11/24  2:56 AM  Result Value Ref Range Status   Enterococcus faecalis NOT DETECTED NOT DETECTED Final   Enterococcus Faecium NOT DETECTED NOT DETECTED Final   Listeria monocytogenes NOT DETECTED NOT DETECTED Final   Staphylococcus species DETECTED (A) NOT DETECTED Final    Comment: CRITICAL RESULT CALLED TO, READ BACK BY AND VERIFIED WITH: PHARMD J. OZIQNMI [id number] @0029  FH    Staphylococcus aureus (BCID) NOT DETECTED NOT DETECTED Final   Staphylococcus epidermidis DETECTED (A) NOT DETECTED Final    Comment: Methicillin (oxacillin) resistant coagulase negative staphylococcus. Possible blood culture contaminant (unless isolated from more than one blood culture draw or clinical  case suggests pathogenicity). No antibiotic treatment is indicated for blood  culture contaminants. CRITICAL RESULT CALLED TO, READ BACK BY AND VERIFIED WITH: PHARMD J. LEDFORD [id number] @0029  FH    Staphylococcus lugdunensis NOT DETECTED NOT DETECTED Final  Streptococcus species NOT DETECTED NOT DETECTED Final   Streptococcus agalactiae NOT DETECTED NOT DETECTED Final   Streptococcus pneumoniae NOT DETECTED NOT DETECTED Final   Streptococcus pyogenes NOT DETECTED NOT DETECTED Final   A.calcoaceticus-baumannii NOT DETECTED NOT DETECTED Final   Bacteroides fragilis NOT DETECTED NOT DETECTED Final   Enterobacterales NOT DETECTED NOT DETECTED Final   Enterobacter cloacae complex NOT DETECTED NOT DETECTED Final   Escherichia coli NOT DETECTED NOT DETECTED Final   Klebsiella aerogenes NOT DETECTED NOT DETECTED Final   Klebsiella oxytoca NOT DETECTED NOT DETECTED Final   Klebsiella pneumoniae NOT DETECTED NOT DETECTED Final   Proteus species NOT DETECTED NOT DETECTED Final   Salmonella species NOT DETECTED NOT DETECTED Final   Serratia marcescens NOT DETECTED NOT DETECTED Final   Haemophilus influenzae NOT DETECTED NOT DETECTED Final   Neisseria meningitidis NOT DETECTED NOT DETECTED Final   Pseudomonas aeruginosa NOT DETECTED NOT DETECTED Final   Stenotrophomonas maltophilia NOT DETECTED NOT DETECTED Final   Candida albicans NOT DETECTED NOT DETECTED Final   Candida auris NOT DETECTED NOT DETECTED Final   Candida glabrata NOT DETECTED NOT DETECTED Final   Candida krusei NOT DETECTED NOT DETECTED Final   Candida parapsilosis NOT DETECTED NOT DETECTED Final   Candida tropicalis NOT DETECTED NOT DETECTED Final   Cryptococcus neoformans/gattii NOT DETECTED NOT DETECTED Final   Methicillin resistance mecA/C DETECTED (A) NOT DETECTED Final    Comment: CRITICAL RESULT CALLED TO, READ BACK BY AND VERIFIED WITH: PHARMD JSABRA OZIQNMI [id number] @0029  FH Performed at [facility], [address] [place], [place] [address]   Culture, blood (Routine X 2) w Reflex to ID Panel     Status: None (Preliminary result)   Collection Time: 07/12/24  5:02 PM   Specimen: BLOOD RIGHT HAND  Result Value Ref Range Status   Specimen Description BLOOD RIGHT HAND  Final   Special Requests   Final    BOTTLES DRAWN AEROBIC AND ANAEROBIC Blood Culture results may not be optimal due to an inadequate volume of blood received in culture bottles   Culture   Final    NO GROWTH < 24 HOURS Performed at [facility], [address], [place], [place] [address]    Report Status PENDING  Incomplete         Radiology Studies: CT HEAD WO CONTRAST ( ) Result Date: 07/11/2024 EXAM: CT HEAD WITHOUT 07/11/2024 01:14:00 PM TECHNIQUE: CT of the head was performed without the administration of intravenous contrast. Automated exposure control, iterative reconstruction, and/or weight based adjustment of the mA/kV was utilized to reduce the radiation dose to as low as reasonably achievable. COMPARISON: 07/07/2024 CLINICAL HISTORY: Altered mental status, nontraumatic (Ped 0-17y) FINDINGS: BRAIN AND VENTRICLES: No acute intracranial hemorrhage. No mass effect or midline shift. No extra-axial fluid collection. No evidence of acute infarct. No hydrocephalus. Stable age-related atrophy and moderate chronic ischemic small vessel disease. Moderate calcific atheromatous disease. ORBITS: No acute abnormality. Status post bilateral lens replacement. SINUSES AND MASTOIDS: No acute abnormality. SOFT TISSUES AND SKULL: No acute skull fracture. No acute soft tissue abnormality. IMPRESSION: 1. No acute intracranial abnormality. Electronically signed by: Donnice Mania MD 07/11/2024 01:32 PM EST RP Workstation: [id number]        Scheduled Meds:  atorvastatin   40 mg Oral QPM   cloNIDine   0.2 mg Transdermal Weekly   enoxaparin  (LOVENOX ) injection  80 mg Subcutaneous Q12H   finasteride   5 mg Oral q AM   hydrALAZINE   10 mg  Intravenous Q6H   melatonin  5 mg Oral QHS   multivitamin with minerals  1 tablet Oral q AM   nitroGLYCERIN   1 inch Topical Q6H   polyethylene glycol  17 g Oral Daily   senna-docusate  2 tablet Oral QHS   tamsulosin   0.4 mg Oral QHS   Continuous Infusions:  amiodarone      azithromycin  500 mg (07/13/24 1019)   diltiazem  (CARDIZEM ) infusion 15 mg/hr (07/13/24 0944)   fluconazole  (DIFLUCAN ) IV Stopped (07/12/24 1638)   linezolid  (ZYVOX ) IV 600 mg (07/13/24 1023)   piperacillin -tazobactam (ZOSYN )  IV 3.375 g (07/13/24 0837)   potassium chloride  10 mEq (07/13/24 0946)     LOS: 2 days       Brayton Lye, MD [facility]   To contact the attending provider between 7A-7P or the covering provider during after hours 7P-7A, please log into the web site [email or web address] and access using universal [place] password for that web site. If you do not have the password, please call the hospital operator.  07/13/2024, 10:57 AM

## 2024-07-13 NOTE — Progress Notes (Signed)
 SLP Cancellation Note  Patient Details Name: Nathan Santiago MRN: [id number] DOB: 10/17/1930   Cancelled treatment:       Reason Eval/Treat Not Completed: Patient declined, no reason specified. Patient initial refusing SLP attempts at oral care, turning head away, closing lips together tightly and saying nononononono while appearing to become agitated. RN able to perform oral care and remove some wet globs of secretions in oral cavity but patient continued to be resistant overall. SLP did not attempt any PO's.   Norleen IVAR Blase, MA, CCC-SLP Speech Therapy  07/13/2024, 12:57 PM

## 2024-07-13 NOTE — Plan of Care (Signed)
  Problem: Health Behavior/Discharge Planning: Goal: Ability to manage health-related needs will improve Outcome: Progressing   Problem: Clinical Measurements: Goal: Will remain free from infection Outcome: Progressing Goal: Respiratory complications will improve Outcome: Progressing   Problem: Activity: Goal: Risk for activity intolerance will decrease Outcome: Progressing   Problem: Coping: Goal: Level of anxiety will decrease Outcome: Progressing   Problem: Safety: Goal: Ability to remain free from injury will improve Outcome: Progressing   Problem: Skin Integrity: Goal: Risk for impaired skin integrity will decrease Outcome: Progressing

## 2024-07-13 NOTE — Progress Notes (Signed)
 Spoke with Dr. Debarah with cardiology regarding patient in A-fib RVR with rate in 140s-150s and currently on amiodarone  drip, no new orders at this time, Dr. Debarah advised to continue to monitor patient and page again if patient's blood pressure dropped or heat rate sustained above 160.

## 2024-07-13 NOTE — Progress Notes (Signed)
 OT Cancellation Note  Patient Details Name: KEDAR SEDANO MRN: [id number] DOB: 1930-12-30   Cancelled Treatment:    Reason Eval/Treat Not Completed: Medical issues which prohibited therapy (Pt tachycardic at rest (HR 140s at rest) and aPTT slightly subtherapeutic. OT to reattempt to see pt for acute OT eval at a later time as appropriate/available.)  Margarie Rockey CHRISTELLA., OTR/L, MA Acute Rehab [phone]   Margarie FORBES Horns 07/13/2024, 10:34 AM

## 2024-07-13 NOTE — Progress Notes (Signed)
 Overnight cross coverage  Patient had brain MRI ordered during dayshift to rule out stroke.  Informed by RN that due to patient having tremor it will be difficult for him to stay still during MRI and requested medication.  Patient is NPO.  IV Ativan  0.5 mg x 1 ordered.

## 2024-07-13 NOTE — Progress Notes (Signed)
°   07/13/24 0410  Vitals  Temp 99.5 F (37.5 C)  Temp Source Axillary  BP (!) 165/80  MAP (mmHg) 105  BP Location Left Arm  BP Method Automatic  Patient Position (if appropriate) Lying  Pulse Rate (!) 148  Pulse Rate Source Monitor  ECG Heart Rate (!) 147  Resp (!) 24  MEWS COLOR  MEWS Score Color Red  Oxygen Therapy  SpO2 94 %  O2 Device Nasal Cannula  O2 Flow Rate (L/min) 2 L/min  MEWS Score  MEWS Temp 0  MEWS Systolic 0  MEWS Pulse 3  MEWS RR 1  MEWS LOC 0  MEWS Score 4  Provider Notification  Provider Name/Title MD Rathore/ MD Debarah  Date Provider Notified 07/13/24  Time Provider Notified 0410  Method of Notification Page  Notification Reason Other (Comment) (HR sustaining to 150s and RED mews)  Provider response Evaluate remotely;See new orders  Date of Provider Response 07/13/24  Time of Provider Response [phone]

## 2024-07-14 ENCOUNTER — Other Ambulatory Visit (HOSPITAL_COMMUNITY): Payer: Self-pay

## 2024-07-14 ENCOUNTER — Inpatient Hospital Stay (HOSPITAL_COMMUNITY)

## 2024-07-14 LAB — BASIC METABOLIC PANEL WITH GFR
Anion gap: 10 (ref 5–15)
BUN: 12 mg/dL (ref 8–23)
CO2: 21 mmol/L — ABNORMAL LOW (ref 22–32)
Calcium: 8.1 mg/dL — ABNORMAL LOW (ref 8.9–10.3)
Chloride: 116 mmol/L — ABNORMAL HIGH (ref 98–111)
Creatinine, Ser: 1.32 mg/dL — ABNORMAL HIGH (ref 0.61–1.24)
GFR, Estimated: 51 mL/min — ABNORMAL LOW (ref >60)
Glucose, Bld: 156 mg/dL — ABNORMAL HIGH (ref 70–99)
Potassium: 3.4 mmol/L — ABNORMAL LOW (ref 3.5–5.1)
Sodium: 147 mmol/L — ABNORMAL HIGH (ref 135–145)

## 2024-07-14 LAB — CBC
HCT: 32.4 % — ABNORMAL LOW (ref 39.0–52.0)
Hemoglobin: 10.5 g/dL — ABNORMAL LOW (ref 13.0–17.0)
MCH: 29.7 pg (ref 26.0–34.0)
MCHC: 32.4 g/dL (ref 30.0–36.0)
MCV: 91.8 fL (ref 80.0–100.0)
Platelets: 238 K/uL (ref 150–400)
RBC: 3.53 MIL/uL — ABNORMAL LOW (ref 4.22–5.81)
RDW: 14.6 % (ref 11.5–15.5)
WBC: 15.7 K/uL — ABNORMAL HIGH (ref 4.0–10.5)
nRBC: 0 % (ref 0.0–0.2)

## 2024-07-14 LAB — MAGNESIUM: Magnesium: 2.3 mg/dL (ref 1.7–2.4)

## 2024-07-14 LAB — GLUCOSE, CAPILLARY
Glucose-Capillary: 156 mg/dL — ABNORMAL HIGH (ref 70–99)
Glucose-Capillary: 161 mg/dL — ABNORMAL HIGH (ref 70–99)
Glucose-Capillary: 162 mg/dL — ABNORMAL HIGH (ref 70–99)
Glucose-Capillary: 163 mg/dL — ABNORMAL HIGH (ref 70–99)
Glucose-Capillary: 208 mg/dL — ABNORMAL HIGH (ref 70–99)

## 2024-07-14 LAB — PHOSPHORUS: Phosphorus: 2 mg/dL — ABNORMAL LOW (ref 2.5–4.6)

## 2024-07-14 LAB — PROCALCITONIN: Procalcitonin: 0.35 ng/mL

## 2024-07-14 MED ORDER — LORAZEPAM 1 MG PO TABS: 1.0000 mg | ORAL_TABLET | ORAL | Status: DC | PRN

## 2024-07-14 MED ORDER — ONDANSETRON HCL 4 MG/2ML IJ SOLN: 4.0000 mg | Freq: Four times a day (QID) | INTRAMUSCULAR | Status: DC | PRN

## 2024-07-14 MED ORDER — MORPHINE SULFATE (PF) 2 MG/ML IV SOLN: 2.0000 mg | INTRAVENOUS | Status: DC | PRN

## 2024-07-14 MED ORDER — POTASSIUM CHLORIDE 10 MEQ/100ML IV SOLN
10.0000 meq | INTRAVENOUS | Status: AC
  Administered 2024-07-14 (×3): 10 meq via INTRAVENOUS
  Filled 2024-07-14 (×3): qty 100

## 2024-07-14 MED ORDER — HALOPERIDOL 0.5 MG PO TABS: 0.5000 mg | ORAL_TABLET | ORAL | Status: DC | PRN

## 2024-07-14 MED ORDER — POLYVINYL ALCOHOL 1.4 % OP SOLN: 1.0000 [drp] | Freq: Four times a day (QID) | OPHTHALMIC | Status: DC | PRN

## 2024-07-14 MED ORDER — ACETAMINOPHEN 325 MG PO TABS: 650.0000 mg | ORAL_TABLET | Freq: Four times a day (QID) | ORAL | Status: DC | PRN

## 2024-07-14 MED ORDER — POTASSIUM PHOSPHATES 15 MMOLE/5ML IV SOLN
30.0000 mmol | Freq: Once | INTRAVENOUS | Status: AC
  Administered 2024-07-14: 09:00:00 30 mmol via INTRAVENOUS
  Filled 2024-07-14: qty 10

## 2024-07-14 MED ORDER — LORAZEPAM 2 MG/ML IJ SOLN
1.0000 mg | INTRAMUSCULAR | Status: DC | PRN
  Administered 2024-07-15 (×2): 1 mg via INTRAVENOUS
  Filled 2024-07-14 (×2): qty 1

## 2024-07-14 MED ORDER — HALOPERIDOL LACTATE 2 MG/ML PO CONC: 0.5000 mg | ORAL | Status: DC | PRN

## 2024-07-14 MED ORDER — LORAZEPAM 2 MG/ML PO CONC: 1.0000 mg | ORAL | Status: DC | PRN

## 2024-07-14 MED ORDER — MORPHINE SULFATE (CONCENTRATE) 10 MG /0.5 ML PO SOLN: 5.0000 mg | ORAL | Status: DC | PRN

## 2024-07-14 MED ORDER — ONDANSETRON 4 MG PO TBDP: 4.0000 mg | ORAL_TABLET | Freq: Four times a day (QID) | ORAL | Status: DC | PRN

## 2024-07-14 MED ORDER — SODIUM CHLORIDE 0.9% FLUSH: 10.0000 mL | INTRAVENOUS | Status: DC | PRN

## 2024-07-14 MED ORDER — HYDROMORPHONE HCL 1 MG/ML IJ SOLN: 0.5000 mg | INTRAMUSCULAR | Status: DC | PRN

## 2024-07-14 MED ORDER — HALOPERIDOL LACTATE 5 MG/ML IJ SOLN: 0.5000 mg | INTRAMUSCULAR | Status: DC | PRN

## 2024-07-14 MED ORDER — ACETAMINOPHEN 650 MG RE SUPP: 650.0000 mg | Freq: Four times a day (QID) | RECTAL | Status: DC | PRN

## 2024-07-14 MED ORDER — DIPHENHYDRAMINE HCL 50 MG/ML IJ SOLN: 12.5000 mg | INTRAMUSCULAR | Status: DC | PRN

## 2024-07-14 NOTE — Progress Notes (Signed)
 PT Cancellation Note  Patient Details Name: Nathan Santiago MRN: [id number] DOB: [birth year]   Cancelled Treatment:    Reason Eval/Treat Not Completed: Medical issues which prohibited therapy;Other (comment); initiated to get history from family in the room due to pt sleeping.  Report he was getting mechanical lift to chair at facility and had not been getting PT a couple of months.  MD in to discuss with family due to new stroke.  Will follow up another day if pt able to participate and to determine family plans for goals of care.    Montie Portal 07/14/2024, 1:49 PM Micheline Portal, PT Acute Rehabilitation Services Office:[phone] 07/14/2024

## 2024-07-14 NOTE — Progress Notes (Signed)
 PT Cancellation Note  Patient Details Name: Nathan Santiago MRN: [id number] DOB: 08/28/1930   Cancelled Treatment:    Reason Eval/Treat Not Completed: Patient at procedure or test/unavailable; RN and dietician in the room placing cortrak.  Will follow up.    Montie Portal 07/14/2024, 12:41 PM Micheline Portal, PT Acute Rehabilitation Services [phone] 07/14/2024

## 2024-07-14 NOTE — Plan of Care (Signed)
°  Problem: Health Behavior/Discharge Planning: Goal: Ability to manage health-related needs will improve Outcome: Progressing   Problem: Clinical Measurements: Goal: Ability to maintain clinical measurements within normal limits will improve Outcome: Progressing Goal: Will remain free from infection Outcome: Progressing Goal: Diagnostic test results will improve Outcome: Progressing Goal: Respiratory complications will improve Outcome: Progressing   Problem: Safety: Goal: Ability to remain free from injury will improve Outcome: Progressing   Problem: Skin Integrity: Goal: Risk for impaired skin integrity will decrease Outcome: Progressing

## 2024-07-14 NOTE — Progress Notes (Signed)
 Rounding Note   Patient Name: Nathan [facility] Date of Encounter: 07/14/2024  [facility] Cardiologist: Dorn Lesches, MD   Subjective Remains confused  Scheduled Meds:  atorvastatin   40 mg Oral QPM   cloNIDine   0.2 mg Transdermal Weekly   enoxaparin  (LOVENOX ) injection  80 mg Subcutaneous Q12H   finasteride   5 mg Oral q AM   hydrALAZINE   20 mg Intravenous Q4H   insulin  aspart  0-6 Units Subcutaneous Q4H   melatonin  5 mg Oral QHS   multivitamin with minerals  1 tablet Oral q AM   nitroGLYCERIN   1 inch Topical Q6H   ofloxacin   1 drop Both Eyes QID   polyethylene glycol  17 g Oral Daily   senna-docusate  2 tablet Oral QHS   tamsulosin   0.4 mg Oral QHS   Continuous Infusions:  amiodarone  30 mg/hr (07/13/24 2030)   diltiazem  (CARDIZEM ) infusion 15 mg/hr (07/14/24 0019)   fluconazole  (DIFLUCAN ) IV Stopped (07/13/24 1541)   linezolid  (ZYVOX ) IV Stopped (07/13/24 2138)   piperacillin -tazobactam (ZOSYN )  IV 3.375 g (07/14/24 0539)   potassium chloride  10 mEq (07/14/24 0811)   potassium PHOSPHATE  IVPB (in mmol)     PRN Meds: acetaminophen , hydrALAZINE , LORazepam    Vital Signs  Vitals:   07/14/24 0500 07/14/24 0600 07/14/24 0700 07/14/24 0800  BP: (!) 177/48 (!) 164/56 (!) 148/125 (!) 179/44  Pulse: 89 91 91 90  Resp: 19 14 20 20   Temp:    98.5 F (36.9 C)  TempSrc:    Axillary  SpO2: 93% 95% 95% 95%  Weight:      Height:        Intake/Output Summary (Last 24 hours) at 07/14/2024 0827 Last data filed at 07/14/2024 0255 Gross per 24 hour  Intake 1985.19 ml  Output 700 ml  Net 1285.19 ml      07/13/2024    7:00 AM 07/07/2024    6:37 PM 07/07/2024    2:57 PM  Last 3 Weights  Weight (lbs) 179 lb 14.3 oz 180 lb 184 lb  Weight (kg) 81.6 kg 81.647 kg 83.462 kg      Telemetry Sinus - Personally Reviewed   Physical Exam  GEN: Confused Neck: Supple Cardiac: RRR, no gallop Respiratory: CTA; no wheeze GI: Soft MS: No edema. Neuro:  Encephalopathic, moves all extremities Psych: Not assessed due to confusion.  Labs    Chemistry Recent Labs  Lab 07/09/24 [phone] 07/10/24 0432 07/11/24 0250 07/11/24 0510 07/12/24 0206 07/12/24 1943 07/13/24 0610 07/13/24 0803 07/14/24 0304  NA 144 146* 147*   < > 151* 152* 149*  --  147*  K 4.0 3.7 3.8   < > 3.6 3.3* 3.4*  --  3.4*  CL 113* 114* 116*  --  120* 117* 115*  --  116*  CO2 23 23 21*  --  19* 21* 20*  --  21*  GLUCOSE 168* 139* 138*  --  164* 180* 178*  --  156*  BUN 60* 38* 31*  --  23 19 15   --  12  CREATININE 2.62* 1.58* 1.46*  --  1.34* 1.20 1.25*  --  1.32*  CALCIUM  8.3* 8.5* 8.8*  --  9.1 8.8* 8.7*  --  8.1*  MG  --   --   --   --  2.1  --   --  2.3 2.3  PROT 6.2* 6.7 7.2  --   --   --   --   --   --  ALBUMIN 2.2* 2.3* 2.6*  --   --   --   --   --   --   AST 47* 35 32  --   --   --   --   --   --   ALT 57* 52* 48*  --   --   --   --   --   --   ALKPHOS 121 117 122  --   --   --   --   --   --   BILITOT 0.5 0.4 0.4  --   --   --   --   --   --   GFRNONAA 22* 41* 45*  --  50* 57* 54*  --  51*  ANIONGAP 8 9 10   --  12 14 14   --  10   < > = values in this interval not displayed.     Hematology Recent Labs  Lab 07/12/24 0206 07/13/24 0610 07/14/24 0304  WBC 11.6* 17.6* 15.7*  RBC 3.78* 3.96* 3.53*  HGB 11.2* 11.6* 10.5*  HCT 35.2* 35.9* 32.4*  MCV 93.1 90.7 91.8  MCH 29.6 29.3 29.7  MCHC 31.8 32.3 32.4  RDW 14.0 14.2 14.6  PLT 252 269 238    BNP Recent Labs  Lab 07/12/24 0206  BNP 600.5*      Patient Profile   89 year old male with past medical history of coronary artery disease, paroxysmal atrial fibrillation, peripheral vascular disease, carotid artery disease, abdominal aortic aneurysm, hypertension, hyperlipidemia, anemia for evaluation of atrial fibrillation. Has had previous PCI of his right coronary and left circumflex. Last echocardiogram February 2025 showed normal LV function, mild left ventricular hypertrophy, grade 1 diastolic  dysfunction, mild left atrial enlargement. Patient discharged on December 11 after being admitted with acute encephalopathy felt secondary to dehydration and urinary tract infection. He was treated with antibiotics and his renal function improved and he was ultimately discharged. However he again developed confusion  and was brought back to the emergency room.   Assessment & Plan  1 paroxysmal atrial fibrillation-patient , remains in sinus rhythm this AM. Continue IV amiodarone  and Cardizem .  Continue Lovenox .  Resume apixaban  once mental status improves.     2 hypertensive urgency- blood pressure improved but remains elevated.  He is unable to take his oral medications due to encephalopathy.  Continue Cardizem , hydralazine ; clonidine  and Nitropaste also added.  Follow blood pressure and adjust as needed.   3 encephalopathy-etiology unclear; question secondary to pneumonia.  Antibiotics per primary care. Also significantly wide pulse pressure.  Will arrange echocardiogram to rule out aortic valve vegetation/aortic insufficiency.   4 history of coronary artery disease-resume statin when able.  For questions or updates, please contact [facility] Please consult [email or web address] for contact info under      Signed, Redell Shallow, MD  07/14/2024, 8:27 AM

## 2024-07-14 NOTE — Progress Notes (Signed)
 OT Cancellation Note  Patient Details Name: MAXIMIANO LOTT MRN: [id number] DOB: [birth year]   Cancelled Treatment:    Reason Eval/Treat Not Completed: Other (comment) Pt is Medicare and current D/C plan is SNF. No apparent immediate acute care OT needs, therefore will defer OT to SNF. If OT eval is needed please call Acute Rehab Dept. at [phone] or text page OT at [phone].    Leita Howell, OTR/L,CBIS  Supplemental OT - [facility] and [facility] Secure Chat Preferred   07/14/2024, 2:26 PM

## 2024-07-14 NOTE — Progress Notes (Signed)
 SLP Cancellation Note  Patient Details Name: Nathan Santiago MRN: [id number] DOB: 05/07/1931   Cancelled treatment:       Reason Eval/Treat Not Completed: Other (comment) (Pt has transitioned to comfort care. SLP will sign off.)   Peyton JINNY Rummer 07/14/2024, 2:52 PM

## 2024-07-14 NOTE — Care Management Important Message (Signed)
 Important Message  Patient Details  Name: Nathan Santiago MRN: [id number] Date of Birth: 06-Sep-1930   Important Message Given:  Yes - Medicare IM     Claretta Deed 07/14/2024, 4:35 PM

## 2024-07-14 NOTE — Progress Notes (Signed)

## 2024-07-14 NOTE — Procedures (Signed)
 Cortrak  Person Inserting Tube:  Mady Dolly, RD Tube Type:  Cortrak - 43 inches Tube Size:  10 Tube Location:  Left nare Secured by: Bridle Initial Placement:  Gastric Technique Used to Measure Tube Placement:  Marking at nare/corner of mouth Cortrak Secured At:  70 cm   Cortrak Tube Team Note:  Consult received to place a Cortrak feeding tube.   No x-ray is required. RN may begin using tube.   If the tube becomes dislodged please keep the tube and contact the Cortrak team at [email or web address] for replacement.  If after hours and replacement cannot be delayed, place a NG tube and confirm placement with an abdominal x-ray.    Dolly Mady MS, RD, LDN Registered Dietitian Clinical Nutrition RD Inpatient Contact Info in Amion

## 2024-07-14 NOTE — TOC Initial Note (Signed)
 Transition of Care [facility]) - Initial/Assessment Note    Patient Details  Name: Nathan Santiago MRN: [id number] Date of Birth: Nov 20, 1930  [facility]) CM/SW Contact:    [name], LCSW Phone Number: 07/14/2024, 11:46 AM  Clinical Narrative:                 ICM confirmed with [facility] that patient is an ltc resident there and can return when stable. CSW will continue to follow for needs and medical stability.   Expected Discharge Plan: Skilled Nursing Facility Barriers to Discharge: Continued Medical Work up   Patient Goals and CMS Choice            Expected Discharge Plan and Services In-house Referral: Clinical Social Work     Living arrangements for the past 2 months: Skilled Nursing Facility                                      Prior Living Arrangements/Services Living arrangements for the past 2 months: Skilled Nursing Facility Lives with:: Facility Resident Patient language and need for interpreter reviewed:: Yes Do you feel safe going back to the place where you live?: Yes      Need for Family Participation in Patient Care: Yes (Comment) Care giver support system in place?: Yes (comment)   Criminal Activity/Legal Involvement Pertinent to Current Situation/Hospitalization: No - Comment as needed  Activities of Daily Living   ADL Screening (condition at time of admission) Independently performs ADLs?: No Does the patient have a NEW difficulty with bathing/dressing/toileting/self-feeding that is expected to last >3 days?: Yes (Initiates electronic notice to provider for possible OT consult) Does the patient have a NEW difficulty with getting in/out of bed, walking, or climbing stairs that is expected to last >3 days?: Yes (Initiates electronic notice to provider for possible PT consult) Does the patient have a NEW difficulty with communication that is expected to last >3 days?: Yes (Initiates electronic notice to provider for  possible SLP consult) Is the patient deaf or have difficulty hearing?: No Does the patient have difficulty seeing, even when wearing glasses/contacts?: No Does the patient have difficulty concentrating, remembering, or making decisions?: Yes  Permission Sought/Granted Permission sought to share information with : Facility Medical Sales Representative, Family Supports Permission granted to share information with : No  Share Information with NAME: Taino, Maertens [phone] / [phone]  Permission granted to share info w AGENCY: [facility]        Emotional Assessment Appearance:: Appears stated age Attitude/Demeanor/Rapport: Unable to Assess Affect (typically observed): Unable to Assess Orientation: :  (Disoriented x4) Alcohol  / Substance Use: Not Applicable Psych Involvement: No (comment)  Admission diagnosis:  Hospital-acquired pneumonia [J18.9, Y95] Acute respiratory failure with hypoxia (HCC) [J96.01] Acute encephalopathy [G93.40] Patient Active Problem List   Diagnosis Date Noted   Acute encephalopathy 07/11/2024   UTI (urinary tract infection) 07/07/2024   BPH (benign prostatic hyperplasia) 06/20/2024   Stasis dermatitis 03/18/2024   Diastolic CHF (HCC) 03/18/2024   Gout attack 03/18/2024   Type 2 diabetes mellitus with diabetic chronic kidney disease (HCC) 02/08/2024   Renal artery stenosis 11/21/2023   A-fib (HCC) 09/28/2023   Memory loss 11/16/2022   Hyperlipidemia 07/28/2022   Chronic ischemic heart disease 07/28/2022   Iron  deficiency anemia 07/14/2022   Neuropathy 05/13/2022   Chronic cough 04/15/2018   Gait abnormality 02/13/2017   Claudication 04/24/2016  Sensorineural hearing loss (SNHL), bilateral 03/09/2016   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 [place] [address] Phone: [phone] Fax: [phone]     Social Drivers of Health (SDOH) Social History: SDOH Screenings   Food Insecurity: Patient Unable To Answer (07/08/2024)  Housing: Patient Unable To Answer (07/08/2024)  Transportation Needs: No Transportation Needs (07/13/2024)  Utilities: Not At Risk (07/13/2024)  Alcohol  Screen: Low Risk (11/28/2022)  Depression (PHQ2-9): Low Risk (01/11/2024)  Financial Resource Strain: Low Risk (11/28/2022)  Physical Activity: Insufficiently Active (11/28/2022)  Social Connections: Moderately Integrated (07/13/2024)  Stress: No Stress Concern Present (11/28/2022)  Tobacco Use: Medium Risk (07/13/2024)   SDOH Interventions:     Readmission Risk Interventions    07/08/2024   11:44 AM 07/10/2022    2:59 PM  Readmission Risk Prevention Plan  Post Dischage Appt  Complete  Medication Screening  Complete  Transportation Screening Complete Complete  PCP or Specialist Appt within 5-7 Days Complete   Home Care Screening Complete   Medication Review (RN CM) Referral to Pharmacy

## 2024-07-14 NOTE — Progress Notes (Signed)
 Goals of care discussion: Patient MRI can be significant for acute anterior right frontal lobe subcortical white matter infarct, with underlying progression of chronic cerebral white matter disease, though his current encephalopathy very likely mainly attributed to his acute stroke, I have discussed with family, including wife and both sons, at this point patient age, baseline poor functional status, progressive mental, and functional decline, well given patient clear directions to the family that he does not want any life support, artificial feeding or any measures to extend his life if it is poor quality, I think it is best if we proceed with full comfort measures, and all are in agreement with that, so we will discontinue all active medications, and will proceed with comfort care, orders for comfort set has ordered, palliative care has been consulted, agreed to reassess over the next 24 hours clinical condition and disposition plan, hospital death versus residential hospice. Brayton Lye MD

## 2024-07-14 NOTE — Progress Notes (Signed)
 PROGRESS NOTE    [name] [facility]  FMW:[id number] DOB: 09/08/1930 DOA: 07/11/2024 PCP: Charlanne Fredia CROME, MD   Chief Complaint  Patient presents with   Altered Mental Status    Brief Narrative:    [name] is a 89 y.o. male with medical history significant of CAD, PAF, AAA, PAD, HTN, HLD, wheelchair dependent, SNF resident [facility], discharged 07/10/2024, he was admitted 07/07/2024 due to acute encephalopathy secondary to UTI, he was discharged in stable condition and mentation back at baseline as discussed with his wife at bedside.  -Patient presents to ED secondary to confusion, altered mental status, multiple electrolyte derangement, concern for sepsis and hypertensive urgency.  As well A-fib with RVR, workup significant for left lower lobe pneumonia.  Assessment & Plan:   Principal Problem:   Acute encephalopathy   Hypertensive emergency - Blood pressure significantly elevated.   - Currently cannot take any oral medications . - He is currently on Cardizem  drip, max dose for A-fib with RVR, but now heart rate has improved likely will need to discontinue, then I would consider nitro drip -PTA amlodipine  10mg  daily, clonidine  0.1 mg 3 times daily, finasteride  5 mg, hydralazine  50 mg 4 times daily and irbesartan  150mg  daily (pta valsartan  120mg  daily), resume all of those medications once he passes the swallow(or has coretrak inserted) - Plan for core Trak tube today, so we will trial transition to oral regimen once cortrak is inserted.   Acute encephalopathy . - multifactorial, in the setting of electrolyte derangement including hyponatremia, as well infectious process/sepsis, and hypertensive emergency.  - CT head with no acute findings, will need MRI brain when medically stable, at this point still confused, will require significant sedation for his MRI which likely will worsen his encephalopathy.  So we will await some more improvement to ensure more  compliance with MRI without significant sedation. - Significantly altered, unsafe to swallow, will request cortrak insertion with tube feed  Sepsis, POA Left lower lobe pneumonia Bacteremia, 3/4 bottles -Sepsis POA, the setting of leukocytosis, tachypnea, tachycardia and encephalopathy- -will broaden antibiotic coverage to cover for HCAP, continue with IV Zyvox  and Zosyn  (broadened to Zosyn  to cover for possible aspiration pneumonia, as well he had Pseudomonas UTI last February), as we will continue with azithromycin  -Following sputum cultures, Legionella strep pneumonia antigen. - He is negative - Blood culture 3/4 bottles growing gram-positive cocci, possibly contaminant, but will await third bottle which showing Gram stain positive cocci.  He should be covered with Zyvox  currently.    PAF now with RVR: -On amiodarone  drip, Cardizem  drip, heart rate significantly uncontrolled. - Converted to sinus, much improved. -Cardiology input greatly appreciated, continue with amiodarone  drip to help maintain in sinus rhythm. - Eliquis  on hold due to n.p.o. status, on heparin  drip, will transition to Lovenox  due to difficult IV access  CKD stage IIIa - With IV fluids, avoid nephrotoxic medications  Hyperglycemia -Continue with insulin  sliding scale   Hypernatremia  Metabolic acidosis  - Volume depletion dehydration . - Continue with IV fluids .  Hypokalemia Hypophosphatemia -Replaced   Oral thrush - Continue with Diflucan  for now. - Unable to take oral ,will start Magic mouthwash when he is able to take oral.    HLD -PTA atorvastatin  40mg  daily   GERD -PTA pantoprazole , currently IV   BPH -PTA finasteride  5mg  daily and Flomax  0.4mg  daily   GOC:  - Palliative medicine has been consulted, I have discussed with son and wife at bedside,  for now we will continue current measures including antibiotics, fluids and medical management, hoping for improvement but if lack of improvement or  rapid deterioration then will need to talk about goals of care.     DVT prophylaxis: Lovenox / Code Status: (DNR) Family Communication: (discussed with wife at bedside) Disposition:   Status is: Inpatient   Consultants:  Cardiology  Subjective:  No significant events overnight as discussed with staff, data scan with no evidence of retention, patient still confused cannot provide reliable history  Objective: Vitals:   07/14/24 0600 07/14/24 0700 07/14/24 0800 07/14/24 0837  BP: (!) 164/56 (!) 148/125 (!) 179/44 (!) 178/44  Pulse: 91 91 90   Resp: 14 20 20    Temp:   98.5 F (36.9 C)   TempSrc:   Axillary   SpO2: 95% 95% 95%   Weight:      Height:        Intake/Output Summary (Last 24 hours) at 07/14/2024 1158 Last data filed at 07/14/2024 0255 Gross per 24 hour  Intake 1985.19 ml  Output 700 ml  Net 1285.19 ml   Filed Weights   07/13/24 0700  Weight: 81.6 kg    Examination:  Awake, restless, confused, moaning intermittently does not follow any command Air entry bilaterally Tachycardic but regular +ve B.Sounds, Abd Soft,\ No Cyanosis, Clubbing or edema, No new Rash or bruise     Data Reviewed: I have personally reviewed following labs and imaging studies  CBC: Recent Labs  Lab 07/07/24 1836 07/08/24 0347 07/09/24 0333 07/11/24 0250 07/11/24 0510 07/12/24 0206 07/13/24 0610 07/14/24 0304  WBC 7.7   < > 7.9 11.6*  --  11.6* 17.6* 15.7*  NEUTROABS 5.4  --   --  9.7*  --   --   --   --   HGB 11.1*   < > 10.0* 11.5* 9.5* 11.2* 11.6* 10.5*  HCT 34.2*   < > 31.2* 35.7* 28.0* 35.2* 35.9* 32.4*  MCV 90.5   < > 90.4 91.8  --  93.1 90.7 91.8  PLT 237   < > 228 269  --  252 269 238   < > = values in this interval not displayed.    Basic Metabolic Panel: Recent Labs  Lab 07/11/24 0250 07/11/24 0510 07/12/24 0206 07/12/24 1943 07/13/24 0610 07/13/24 0803 07/14/24 0304  NA 147* 152* 151* 152* 149*  --  147*  K 3.8 3.5 3.6 3.3* 3.4*  --  3.4*  CL  116*  --  120* 117* 115*  --  116*  CO2 21*  --  19* 21* 20*  --  21*  GLUCOSE 138*  --  164* 180* 178*  --  156*  BUN 31*  --  23 19 15   --  12  CREATININE 1.46*  --  1.34* 1.20 1.25*  --  1.32*  CALCIUM  8.8*  --  9.1 8.8* 8.7*  --  8.1*  MG  --   --  2.1  --   --  2.3 2.3  PHOS  --   --   --   --   --   --  2.0*    GFR: Estimated Creatinine Clearance: 35.2 mL/min (A) (by C-G formula based on SCr of 1.32 mg/dL (H)).  Liver Function Tests: Recent Labs  Lab 07/07/24 1836 07/08/24 0347 07/09/24 0333 07/10/24 0432 07/11/24 0250  AST 84* 55* 47* 35 32  ALT 62* 56* 57* 52* 48*  ALKPHOS 141* 121 121 117 122  BILITOT  1.6* 0.6 0.5 0.4 0.4  PROT 7.0 6.2* 6.2* 6.7 7.2  ALBUMIN 2.7* 2.2* 2.2* 2.3* 2.6*    CBG: Recent Labs  Lab 07/13/24 1704 07/13/24 2022 07/14/24 0008 07/14/24 0425 07/14/24 0807  GLUCAP 160* 149* 162* 156* 163*     Recent Results (from the past 240 hours)  Resp panel by RT-PCR (RSV, Flu A&B, Covid) Anterior Nasal Swab     Status: None   Collection Time: 07/07/24  8:06 PM   Specimen: Anterior Nasal Swab  Result Value Ref Range Status   SARS Coronavirus 2 by RT PCR NEGATIVE NEGATIVE Final   Influenza A by PCR NEGATIVE NEGATIVE Final   Influenza B by PCR NEGATIVE NEGATIVE Final    Comment: (NOTE) The Xpert Xpress SARS-CoV-2/FLU/RSV plus assay is intended as an aid in the diagnosis of influenza from Nasopharyngeal swab specimens and should not be used as a sole basis for treatment. Nasal washings and aspirates are unacceptable for Xpert Xpress SARS-CoV-2/FLU/RSV testing.  Fact Sheet for Patients: bloggercourse.com  Fact Sheet for Healthcare Providers: seriousbroker.it  This test is not yet approved or cleared by the [place]  FDA and has been authorized for detection and/or diagnosis of SARS-CoV-2 by FDA under an Emergency Use Authorization (EUA). This EUA will remain in effect (meaning this test  can be used) for the duration of the COVID-19 declaration under Section 564(b)(1) of the Act, 21 U.S.C. section 360bbb-3(b)(1), unless the authorization is terminated or revoked.     Resp Syncytial Virus by PCR NEGATIVE NEGATIVE Final    Comment: (NOTE) Fact Sheet for Patients: bloggercourse.com  Fact Sheet for Healthcare Providers: seriousbroker.it  This test is not yet approved or cleared by the [place]  FDA and has been authorized for detection and/or diagnosis of SARS-CoV-2 by FDA under an Emergency Use Authorization (EUA). This EUA will remain in effect (meaning this test can be used) for the duration of the COVID-19 declaration under Section 564(b)(1) of the Act, 21 U.S.C. section 360bbb-3(b)(1), unless the authorization is terminated or revoked.  Performed at [facility], [address] [place], [place] [address]   Urine Culture (for pregnant, neutropenic or urologic patients or patients with an indwelling urinary catheter)     Status: None   Collection Time: 07/07/24 11:38 PM   Specimen: Urine, Clean Catch  Result Value Ref Range Status   Specimen Description URINE, CLEAN CATCH  Final   Special Requests NONE  Final   Culture   Final    NO GROWTH Performed at [facility], [address], [place], [place] [address]    Report Status 07/09/2024 FINAL  Final  Culture, blood (Routine X 2) w Reflex to ID Panel     Status: None (Preliminary result)   Collection Time: 07/11/24  2:51 AM   Specimen: BLOOD  Result Value Ref Range Status   Specimen Description BLOOD RIGHT ANTECUBITAL  Final   Special Requests   Final    BOTTLES DRAWN AEROBIC AND ANAEROBIC Blood Culture results may not be optimal due to an inadequate volume of blood received in culture bottles   Culture  Setup Time   Final    GRAM POSITIVE COCCI ANAEROBIC BOTTLE ONLY CRITICAL VALUE NOTED.  VALUE IS CONSISTENT WITH PREVIOUSLY REPORTED AND  CALLED VALUE. Performed at [facility], [address], [place], [place] [address]    Culture GRAM POSITIVE COCCI  Final   Report Status PENDING  Incomplete  Resp panel by RT-PCR (RSV, Flu  A&B, Covid) Anterior Nasal Swab     Status: None   Collection Time: 07/11/24  2:52 AM   Specimen: Anterior Nasal Swab  Result Value Ref Range Status   SARS Coronavirus 2 by RT PCR NEGATIVE NEGATIVE Final   Influenza A by PCR NEGATIVE NEGATIVE Final   Influenza B by PCR NEGATIVE NEGATIVE Final    Comment: (NOTE) The Xpert Xpress SARS-CoV-2/FLU/RSV plus assay is intended as an aid in the diagnosis of influenza from Nasopharyngeal swab specimens and should not be used as a sole basis for treatment. Nasal washings and aspirates are unacceptable for Xpert Xpress SARS-CoV-2/FLU/RSV testing.  Fact Sheet for Patients: bloggercourse.com  Fact Sheet for Healthcare Providers: seriousbroker.it  This test is not yet approved or cleared by the [place]  FDA and has been authorized for detection and/or diagnosis of SARS-CoV-2 by FDA under an Emergency Use Authorization (EUA). This EUA will remain in effect (meaning this test can be used) for the duration of the COVID-19 declaration under Section 564(b)(1) of the Act, 21 U.S.C. section 360bbb-3(b)(1), unless the authorization is terminated or revoked.     Resp Syncytial Virus by PCR NEGATIVE NEGATIVE Final    Comment: (NOTE) Fact Sheet for Patients: bloggercourse.com  Fact Sheet for Healthcare Providers: seriousbroker.it  This test is not yet approved or cleared by the [place]  FDA and has been authorized for detection and/or diagnosis of SARS-CoV-2 by FDA under an Emergency Use Authorization (EUA). This EUA will remain in effect (meaning this test can be used) for the duration of the COVID-19 declaration under Section 564(b)(1) of the  Act, 21 U.S.C. section 360bbb-3(b)(1), unless the authorization is terminated or revoked.  Performed at [facility], [address] [place], [place] [address]   Culture, blood (Routine X 2) w Reflex to ID Panel     Status: Abnormal   Collection Time: 07/11/24  2:56 AM   Specimen: BLOOD  Result Value Ref Range Status   Specimen Description BLOOD LEFT ANTECUBITAL  Final   Special Requests   Final    BOTTLES DRAWN AEROBIC AND ANAEROBIC Blood Culture results may not be optimal due to an inadequate volume of blood received in culture bottles   Culture  Setup Time   Final    GRAM POSITIVE COCCI IN BOTH AEROBIC AND ANAEROBIC BOTTLES CRITICAL RESULT CALLED TO, READ BACK BY AND VERIFIED WITH: PHARMD J. LEDDFORD [id number] @0029  FH    Culture (A)  Final    STAPHYLOCOCCUS EPIDERMIDIS STAPHYLOCOCCUS CAPITIS THE SIGNIFICANCE OF ISOLATING THIS ORGANISM FROM A SINGLE SET OF BLOOD CULTURES WHEN MULTIPLE SETS ARE DRAWN IS UNCERTAIN. PLEASE NOTIFY THE MICROBIOLOGY DEPARTMENT WITHIN ONE WEEK IF SPECIATION AND SENSITIVITIES ARE REQUIRED. Performed at [facility], [address], [place], [place] [address]    Report Status 07/13/2024 FINAL  Final  Blood Culture ID Panel (Reflexed)     Status: Abnormal   Collection Time: 07/11/24  2:56 AM  Result Value Ref Range Status   Enterococcus faecalis NOT DETECTED NOT DETECTED Final   Enterococcus Faecium NOT DETECTED NOT DETECTED Final   Listeria monocytogenes NOT DETECTED NOT DETECTED Final   Staphylococcus species DETECTED (A) NOT DETECTED Final    Comment: CRITICAL RESULT CALLED TO, READ BACK BY AND VERIFIED WITH: PHARMD J. OZIQNMI [id number] @0029  FH    Staphylococcus aureus (BCID) NOT DETECTED NOT DETECTED Final   Staphylococcus epidermidis DETECTED (A) NOT DETECTED Final    Comment: Methicillin (oxacillin) resistant coagulase negative staphylococcus. Possible blood culture  contaminant (unless isolated from more than one blood culture draw or  clinical case suggests pathogenicity). No antibiotic treatment is indicated for blood  culture contaminants. CRITICAL RESULT CALLED TO, READ BACK BY AND VERIFIED WITH: PHARMD J. OZIQNMI [id number] @0029  FH    Staphylococcus lugdunensis NOT DETECTED NOT DETECTED Final   Streptococcus species NOT DETECTED NOT DETECTED Final   Streptococcus agalactiae NOT DETECTED NOT DETECTED Final   Streptococcus pneumoniae NOT DETECTED NOT DETECTED Final   Streptococcus pyogenes NOT DETECTED NOT DETECTED Final   A.calcoaceticus-baumannii NOT DETECTED NOT DETECTED Final   Bacteroides fragilis NOT DETECTED NOT DETECTED Final   Enterobacterales NOT DETECTED NOT DETECTED Final   Enterobacter cloacae complex NOT DETECTED NOT DETECTED Final   Escherichia coli NOT DETECTED NOT DETECTED Final   Klebsiella aerogenes NOT DETECTED NOT DETECTED Final   Klebsiella oxytoca NOT DETECTED NOT DETECTED Final   Klebsiella pneumoniae NOT DETECTED NOT DETECTED Final   Proteus species NOT DETECTED NOT DETECTED Final   Salmonella species NOT DETECTED NOT DETECTED Final   Serratia marcescens NOT DETECTED NOT DETECTED Final   Haemophilus influenzae NOT DETECTED NOT DETECTED Final   Neisseria meningitidis NOT DETECTED NOT DETECTED Final   Pseudomonas aeruginosa NOT DETECTED NOT DETECTED Final   Stenotrophomonas maltophilia NOT DETECTED NOT DETECTED Final   Candida albicans NOT DETECTED NOT DETECTED Final   Candida auris NOT DETECTED NOT DETECTED Final   Candida glabrata NOT DETECTED NOT DETECTED Final   Candida krusei NOT DETECTED NOT DETECTED Final   Candida parapsilosis NOT DETECTED NOT DETECTED Final   Candida tropicalis NOT DETECTED NOT DETECTED Final   Cryptococcus neoformans/gattii NOT DETECTED NOT DETECTED Final   Methicillin resistance mecA/C DETECTED (A) NOT DETECTED Final    Comment: CRITICAL RESULT CALLED TO, READ BACK BY AND VERIFIED WITH: PHARMD JSABRA OZIQNMI [id number] @0029  FH Performed at [facility], [address] [place], [place] [address]   Culture, blood (Routine X 2) w Reflex to ID Panel     Status: None (Preliminary result)   Collection Time: 07/12/24  5:02 PM   Specimen: BLOOD RIGHT HAND  Result Value Ref Range Status   Specimen Description BLOOD RIGHT HAND  Final   Special Requests   Final    BOTTLES DRAWN AEROBIC AND ANAEROBIC Blood Culture results may not be optimal due to an inadequate volume of blood received in culture bottles   Culture   Final    NO GROWTH 2 DAYS Performed at [facility], [address] [place], [place] [address]    Report Status PENDING  Incomplete         Radiology Studies: No results found.       Scheduled Meds:  atorvastatin   40 mg Oral QPM   cloNIDine   0.2 mg Transdermal Weekly   enoxaparin  (LOVENOX ) injection  80 mg Subcutaneous Q12H   finasteride   5 mg Oral q AM   hydrALAZINE   20 mg Intravenous Q4H   insulin  aspart  0-6 Units Subcutaneous Q4H   melatonin  5 mg Oral QHS   multivitamin with minerals  1 tablet Oral q AM   nitroGLYCERIN   1 inch Topical Q6H   ofloxacin   1 drop Both Eyes QID   polyethylene glycol  17 g Oral Daily   senna-docusate  2 tablet Oral QHS   tamsulosin   0.4 mg Oral QHS   Continuous Infusions:  amiodarone  30 mg/hr (07/14/24 0853)   diltiazem  (CARDIZEM ) infusion 15 mg/hr (07/14/24 0853)   fluconazole  (  DIFLUCAN ) IV Stopped (07/13/24 1541)   linezolid  (ZYVOX ) IV 600 mg (07/14/24 0845)   piperacillin -tazobactam (ZOSYN )  IV 3.375 g (07/14/24 0539)   potassium PHOSPHATE  IVPB (in mmol) 30 mmol (07/14/24 0842)     LOS: 3 days       Brayton Lye, MD [facility]   To contact the attending provider between 7A-7P or the covering provider during after hours 7P-7A, please log into the web site [email or web address] and access using universal [place] password for that web site. If you do not have the password, please call the hospital operator.  07/14/2024, 11:58 AM

## 2024-07-15 DIAGNOSIS — Z515 Encounter for palliative care: Secondary | ICD-10-CM

## 2024-07-15 DIAGNOSIS — I639 Cerebral infarction, unspecified: Secondary | ICD-10-CM

## 2024-07-15 LAB — CULTURE, BLOOD (ROUTINE X 2): Culture  Setup Time: NO GROWTH

## 2024-07-15 MED ORDER — MORPHINE SULFATE (PF) 2 MG/ML IV SOLN
2.0000 mg | INTRAVENOUS | Status: DC | PRN
  Administered 2024-07-15 (×2): 2 mg via INTRAVENOUS
  Filled 2024-07-15 (×2): qty 1

## 2024-07-15 MED ORDER — MORPHINE SULFATE (CONCENTRATE) 10 MG /0.5 ML PO SOLN: 5.0000 mg | ORAL | Status: DC | PRN

## 2024-07-15 MED ORDER — HALOPERIDOL LACTATE 2 MG/ML PO CONC: 0.5000 mg | ORAL | Status: DC | PRN

## 2024-07-15 MED ORDER — LORAZEPAM 2 MG/ML PO CONC: 1.0000 mg | ORAL | Status: DC | PRN

## 2024-07-15 NOTE — Progress Notes (Signed)
 Merideth RN from [facility] called for report. Report given and answered all questions.

## 2024-07-15 NOTE — Consult Note (Signed)
 Consultation Note Date: 07/15/2024   Patient Name: Nathan Santiago  DOB: Jan 14, 1931  MRN: [id number]  Age / Sex: 89 y.o., male  PCP: Charlanne Fredia CROME, MD Referring Physician: Sherlon Brayton RAMAN, MD  Reason for Consultation: Establishing goals of care and Terminal Care  HPI/Patient Profile: 89 y.o. male  with past medical history of CAD, PAF, AAA, PAD, HTN, HLD, CKD3a admitted on 07/11/2024 with confusion.   Patient had recent admission last week from 12/8-12/11 for acute cystitis c/b AKI. In the ED, he was found to have hypertensive emergency and LLL CAP. He was in afib with RVR and converted to NSR with amiodarone  then started on IV Cardizem . During hospitalization, he was also found to have bacteremia, metabolic acidosis, and acute anterior right frontal lobe subcortical white matter infarct on 12/15 MRI. His wife made the decision to transition to comfort focused care on 12/15.  PMT has been consulted to assist with goals of care conversation, end of life care.  Clinical Assessment and Goals of Care:  I have reviewed medical records including EPIC notes, labs and imaging, assessed the patient and then met at the bedside with patient's wife to discuss diagnosis prognosis, GOC, EOL wishes, disposition and options.  I introduced Palliative Medicine as specialized medical care for people living with serious illness. It focuses on providing relief from the symptoms and stress of a serious illness. The goal is to improve quality of life for both the patient and the family.  We discussed a brief life review of the patient and then focused on their current illness.   I attempted to elicit values and goals of care important to the patient.    Medical History Review and Understanding:  We discussed patient's acute illness in the context of their chronic comorbidities. Patient's wife understands the severity of patient's illness.  Advance Directives: A detailed  discussion regarding advanced directives was had. Reviewed documentation on file in [place].  Discussion: Patient's wife shares that she has had a good goals of care conversation with patient's primary attending and understands his care plan, poor prognosis, and option of transfer to a hospice facility. She was really hoping this could be done yesterday and prefers [facility]. She has a friend who's husband passed away there recently and she feels this would be a better environment for [name]. We discussed the referral process. Provided education on comfort-focused care while awaiting bed availability. Reviewed comfort medications provided thus far (two doses of PRN Ativan  overnight). No additional questions or concerns at this time.   The difference between aggressive medical intervention and comfort care was considered in light of the patient's goals of care. Hospice services outpatient were explained and offered.   Discussed the importance of continued conversation with family and the medical providers regarding overall plan of care and treatment options, ensuring decisions are within the context of the patients values and GOCs.   Questions and concerns were addressed.  Hard Choices booklet left for review. The family was encouraged to call with questions or concerns.  PMT will continue to support holistically.   SUMMARY OF RECOMMENDATIONS   -Continue DNR/DNI -Continue comfort-focused care, no adjustments required today -Patient's wife would prefer discharge to [facility] as soon as possible. Discussed with MD, TOC, [facility] hospice liaison  -Psychosocial and emotional support provided -PMT will continue to follow  Prognosis:  < 2 weeks  Discharge Planning: Hospice facility      Primary Diagnoses: Present on Admission:  Acute encephalopathy  Physical Exam Vitals and nursing note reviewed.  Constitutional:      General: He is not in acute distress.    Appearance: He is  ill-appearing.  HENT:     Head: Normocephalic and atraumatic.  Cardiovascular:     Rate and Rhythm: Normal rate.  Pulmonary:     Effort: Pulmonary effort is normal. No respiratory distress.  Skin:    General: Skin is dry.  Neurological:     Mental Status: He is lethargic and disoriented.  Psychiatric:        Cognition and Memory: Cognition is impaired.    Vital Signs: BP 90/68 (BP Location: Right Leg)   Pulse 86   Temp 98.6 F (37 C)   Resp 17   Ht 5' 4.02 (1.626 m)   Wt 81.6 kg   SpO2 96%   BMI 30.86 kg/m  Pain Scale: Faces   Pain Score: 0-No pain   SpO2: SpO2: 96 % O2 Device:SpO2: 96 % O2 Flow Rate: .O2 Flow Rate (L/min): 2 L/min   Emmalynn Pinkham SHAUNNA Fell, PA-C  Palliative Medicine Team Team phone # [phone]  Thank you for allowing the Palliative Medicine Team to assist in the care of this patient. Please utilize secure chat with additional questions, if there is no response within 30 minutes please call the above phone number.  Palliative Medicine Team providers are available by phone from 7am to 7pm daily and can be reached through the team cell phone.  Should this patient require assistance outside of these hours, please call the patient's attending physician.     Time Total: 50  Visit consisted of counseling and education dealing with the complex and emotionally intense issues of symptom management and palliative care in the setting of serious and potentially life-threatening illness. Greater than 50% of this time was spent counseling and coordinating care related to the above assessment and plan.  Personally spent 50 minutes in patient care including extensive chart review (labs, imaging, progress/consult notes, vital signs), medically appropraite exam, discussed with treatment team, education to patient, family, and staff, documenting clinical information, medication review and management, coordination of care, and available advanced directive documents.

## 2024-07-15 NOTE — Discharge Summary (Signed)
 Physician Discharge Summary  Nathan Santiago [facility] FMW:[id number] DOB: Jul 31, 1931 DOA: 07/11/2024  PCP: Charlanne Fredia CROME, MD  Admit date: 07/11/2024 Discharge date: 07/15/2024  Admitted From: (SNF) Disposition:  (SNF)  Recommendations for Outpatient Follow-up:  Patient is comfort care   Brief/Interim Summary:   Nathan Santiago is a 89 y.o. male with medical history significant of CAD, PAF, AAA, PAD, HTN, HLD, wheelchair dependent, SNF resident at [facility], discharged 07/10/2024, he was admitted 07/07/2024 due to acute encephalopathy secondary to UTI, he was discharged in stable condition and mentation back at baseline as discussed with his wife at bedside.  -Patient presents to ED secondary to confusion, altered mental status, multiple electrolyte derangement, concern for sepsis and hypertensive urgency.  As well A-fib with RVR, workup significant for left lower lobe pneumonia, bacteremia, and acute CVA, he remains significantly altered, confused, cannot tolerate any oral intake, hypertensive emergency treated appropriately with Cardizem  drip, given multiple drips he was kept on Cardizem  drip for blood pressure and heart rate control, as well as started on Nitropaste, and clonidine  patch, was significant to frequent high-dose hydralazine , his encephalopathy was thought to be multifactorial, secondary to of severe electrolyte derangement including hypernatremia, infectious process and hypertensive emergency, as well open MRI finding of acute CVA it is the major contributing factor of his encephalopathy, and his sepsis felt secondary to pneumonia and bacteremia, he was treated with appropriate antibiotic coverage, and Diflucan  as well for oral thrush, MRI brain significant for acute anterior right frontal lobe subcortical white matter infarct, with underlying progression of chronic cerebral white matter disease, though his current encephalopathy very likely mainly attributed to his acute  stroke, I have discussed with family, including wife and both sons, at this point patient age, baseline poor functional status, progressive mental, and functional decline, well given patient clear directions to the family that he does not want any life support, artificial feeding or any measures to extend his life if it is poor quality, I think it is best if we proceed with full comfort measures, and all are in agreement with that, so we did l discontinue all active medications, and main focus was for comfort care,, orders for comfort set has ordered, palliative care has been consulted, palliative medicine consulted, and family decided to transition to full comfort care, accepted at [facility] 07/15/2024.     Acute CVA End-of-life care Hypertensive emergency Acute metabolic encephalopathy . Sepsis, POA Left lower lobe pneumonia Bacteremia, 3/4 bottles PAF now with RVR: CKD stage IIIa Hyperglycemia Hypernatremia  Metabolic acidosis  Hypokalemia Hypophosphatemia Oral thrush HLD GERD BPH Normocytic anemia Hypoalbuminemia     Discharge Diagnoses:  Principal Problem:   Acute encephalopathy Active Problems:   Acute CVA (cerebrovascular accident) [facility])   Hospice care patient   Palliative care patient    Discharge Instructions  Discharge Instructions     Discharge instructions   Complete by: As directed    Management per [facility]      Allergies as of 07/15/2024       Reactions   Lisinopril Cough   Niaspan [niacin Er (antihyperlipidemic)] Rash        Medication List     STOP taking these medications    acetaminophen  500 MG tablet Commonly known as: TYLENOL    allopurinol  100 MG tablet Commonly known as: ZYLOPRIM    amLODipine  10 MG tablet Commonly known as: NORVASC    apixaban  2.5 MG Tabs tablet Commonly known as: ELIQUIS    Apoaequorin 10 MG Caps  atorvastatin  20 MG tablet Commonly known as: Lipitor   atorvastatin  40  MG tablet Commonly known as: LIPITOR   Biotin  10 MG Tabs   cetirizine  10 MG tablet Commonly known as: ZYRTEC    cholecalciferol  25 MCG (1000 UNIT) tablet Commonly known as: VITAMIN D3   cloNIDine  0.1 MG tablet Commonly known as: Catapres    doxycycline  100 MG tablet Commonly known as: ADOXA   Eliquis  5 MG Tabs tablet Generic drug: apixaban    finasteride  5 MG tablet Commonly known as: PROSCAR    furosemide  20 MG tablet Commonly known as: LASIX    furosemide  40 MG tablet Commonly known as: LASIX    gabapentin  100 MG capsule Commonly known as: NEURONTIN    gentamicin  0.3 % ophthalmic solution Commonly known as: GARAMYCIN    Glucosamine-Chondroitin 750-600 MG Tabs   hydrALAZINE  50 MG tablet Commonly known as: APRESOLINE    iron  polysaccharides 150 MG capsule Commonly known as: NIFEREX   melatonin 5 MG Tabs   metoprolol  tartrate 50 MG tablet Commonly known as: LOPRESSOR    multivitamin with minerals Tabs tablet   OXYGEN   pantoprazole  40 MG tablet Commonly known as: PROTONIX    polyethylene glycol 17 g packet Commonly known as: MiraLax    potassium chloride  SA 20 MEQ tablet Commonly known as: KLOR-CON  M   sennosides-docusate sodium  8.6-50 MG tablet Commonly known as: SENOKOT-S   tamsulosin  0.4 MG Caps capsule Commonly known as: FLOMAX    valsartan  40 MG tablet Commonly known as: DIOVAN    valsartan  80 MG tablet Commonly known as: Diovan        TAKE these medications    haloperidol  2 MG/ML solution Commonly known as: HALDOL  Place 0.3 mLs (0.6 mg total) under the tongue every 4 (four) hours as needed for agitation (or delirium).   LORazepam  2 MG/ML concentrated solution Commonly known as: ATIVAN  Place 0.5 mLs (1 mg total) under the tongue every 4 (four) hours as needed for anxiety.   morphine  CONCENTRATE 10 mg / 0.5 ml concentrated solution Place 0.25 mLs (5 mg total) under the tongue every 2 (two) hours as needed for moderate pain (pain score 4-6)  (or dyspnea).        Allergies[1]  Consultations: Cardiology Palliative medicine   Procedures/Studies: [name] Result Date: 07/14/2024 EXAM: MRI BRAIN WITHOUT CONTRAST 07/14/2024 12:36:51 PM TECHNIQUE: Multiplanar multisequence MRI of the head/brain was performed without the administration of intravenous contrast. COMPARISON: Head CT 07/11/2024, previous MRI 02/07/2021. CLINICAL HISTORY: 89 year old male. Acute neuro deficit, stroke suspected. FINDINGS: BRAIN AND VENTRICLES: Brain volume not significantly changed since 2022. Anterior right middle frontal gyrus subcortical white matter diffusion restriction in an area of 12 mm (series 5 image 78) with associated mild T2 and FLAIR hyperintensity. No hemorrhage or mass effect. No other diffusion restriction. Underlying chronic widespread T2 and FLAIR hyperintensity throughout the cerebral white matter, which has progressed some since 2022. No convincing cortical encephalomalacia or chronic cerebral blood products. Comparatively mild T2 heterogeneity in the deep gray matter nuclei. Brainstem and cerebellum appear negative for age. No midline shift. No hydrocephalus. The sella is unremarkable. Normal flow voids. ORBITS: No acute abnormality. SINUSES AND MASTOIDS: No acute abnormality. BONES AND SOFT TISSUES: Normal visible cervical spine. Normal marrow signal. No acute soft tissue abnormality. IMPRESSION: 1. Acute anterior right frontal lobe subcortical white matter infarct (12 mm). No hemorrhage or mass effect. 2. Progression of chronic cerebral white matter disease since 02/07/2021. Electronically signed by: Helayne Hurst MD 07/14/2024 12:54 PM EST RP Workstation: [id number]   CT HEAD WO CONTRAST ( )  Result Date: 07/11/2024 EXAM: CT HEAD WITHOUT 07/11/2024 01:14:00 PM TECHNIQUE: CT of the head was performed without the administration of intravenous contrast. Automated exposure control, iterative reconstruction, and/or weight based  adjustment of the mA/kV was utilized to reduce the radiation dose to as low as reasonably achievable. COMPARISON: 07/07/2024 CLINICAL HISTORY: Altered mental status, nontraumatic (Ped 0-17y) FINDINGS: BRAIN AND VENTRICLES: No acute intracranial hemorrhage. No mass effect or midline shift. No extra-axial fluid collection. No evidence of acute infarct. No hydrocephalus. Stable age-related atrophy and moderate chronic ischemic small vessel disease. Moderate calcific atheromatous disease. ORBITS: No acute abnormality. Status post bilateral lens replacement. SINUSES AND MASTOIDS: No acute abnormality. SOFT TISSUES AND SKULL: No acute skull fracture. No acute soft tissue abnormality. IMPRESSION: 1. No acute intracranial abnormality. Electronically signed by: Donnice Mania MD 07/11/2024 01:32 PM EST RP Workstation: [id number]   DG Chest Port 1 View Result Date: 07/11/2024 EXAM: 1 VIEW(S) XRAY OF THE CHEST 07/11/2024 03:16:00 AM COMPARISON: CXR 07/07/2024, CT Chest 03/01/2023, CT A/P 07/07/2024. CLINICAL HISTORY: Altered mental state. FINDINGS: LUNGS AND PLEURA: New small left pleural effusion versus left base airspace opacity. Increased interstitial markings. No pneumothorax. HEART AND MEDIASTINUM: Unchanged cardiomediastinal silhouette. Atherosclerotic calcifications. BONES AND SOFT TISSUES: No acute osseous abnormality. IMPRESSION: 1. New small left pleural effusion versus left basilar airspace opacity. 2. Increased interstitial markings. Electronically signed by: Morgane Naveau MD 07/11/2024 03:30 AM EST RP Workstation: [id number]   US  RENAL Result Date: 07/08/2024 CLINICAL DATA:  Acute kidney injury. EXAM: RENAL / URINARY TRACT ULTRASOUND COMPLETE COMPARISON:  Abdomen and pelvis CT dated 07/07/2024. FINDINGS: Right Kidney: Renal measurements: 11.8 x 6.8 x 6.1 cm = volume: 256 mL. Echogenicity within normal limits. No mass or hydronephrosis visualized. Left Kidney: Renal measurements: 9.6 x 5.2 x 5.1 cm = volume:  132 mL. Diffuse cortical thinning. Echogenicity within normal limits. No mass or hydronephrosis visualized. Bladder: Appears normal for degree of bladder distention. Other: The examination was limited by patient body habitus and lack of cooperation. IMPRESSION: 1. No acute abnormality. 2. Left renal atrophy. Electronically Signed   By: Elspeth Bathe M.D.   On: 07/08/2024 15:18   CT ABDOMEN PELVIS WO CONTRAST Result Date: 07/07/2024 EXAM: CT ABDOMEN AND PELVIS WITHOUT CONTRAST 07/07/2024 08:18:20 PM TECHNIQUE: CT of the abdomen and pelvis was performed without the administration of intravenous contrast. Multiplanar reformatted images are provided for review. Automated exposure control, iterative reconstruction, and/or weight-based adjustment of the mA/kV was utilized to reduce the radiation dose to as low as reasonably achievable. COMPARISON: None available. CLINICAL HISTORY: Abdominal pain, acute, nonlocalized; AKI, poor historian, eval for obstruction. FINDINGS: LOWER CHEST: Calcified bibasilar granulomas with calcified lymph node noted on the right interlobular region. Aortic valve leaflet calcification, mitral annular calcification, coronary artery calcification. LIVER: A fluid density lesion within the liver likely represents a simple hepatic cyst versus hemangioma. Subcentimeter hypodensities within the left hepatic lobe are too small to characterize (3.16). The liver is otherwise unremarkable. GALLBLADDER AND BILE DUCTS: Gallbladder is unremarkable. No biliary ductal dilatation. SPLEEN: Punctate calcifications throughout the spleen are consistent with the sequelae of prior granulomatous disease, as seen within the lungs. PANCREAS: Diffusely atrophic pancreas. No focal lesion. Otherwise normal pancreatic contour. No surrounding inflammatory changes. No main pancreatic ductal dilatation. ADRENAL GLANDS: No acute abnormality. KIDNEYS, URETERS AND BLADDER: Atrophic left kidney with renal cortical scarring. Fluid  density lesion within the left kidney likely represents a simple renal cyst. Simple renal cysts do not require additional follow-up unless clinically indicated due  to signs/symptoms. No stones in the kidneys or ureters. No hydronephrosis. No perinephric or periureteral stranding. Urinary bladder is unremarkable. GI AND BOWEL: Stomach demonstrates no acute abnormality. No small or large bowel thickening or dilatation. The appendix is unremarkable. Colonic diverticulosis. PERITONEUM AND RETROPERITONEUM: No ascites. No free air. VASCULATURE: Severe atherosclerotic plaque of the aorta and its main branches. LYMPH NODES: No lymphadenopathy. REPRODUCTIVE ORGANS: No acute abnormality. BONES AND SOFT TISSUES: Total right hip arthroplasty partially visualized. Multilevel mild-to-moderate degenerative change of the spine. Chronic L1 compression fracture. Density sclerotic lesion of the T12 level, likely a bone island. No focal soft tissue abnormality. IMPRESSION: 1. No acute findings in the abdomen or pelvis with limited evaluation on this noncontrast study. 2. Severe atherosclerotic plaque of the aorta and its main branches. 3. Sequelae of prior granulomatous disease. 4. Other, non-acute and/or normal findings as above. Electronically signed by: Morgane Naveau MD 07/07/2024 08:29 PM EST RP Workstation: [id number]   CT Head Wo Contrast Result Date: 07/07/2024 EXAM: CT HEAD WITHOUT CONTRAST 07/07/2024 07:08:00 PM TECHNIQUE: CT of the head was performed without the administration of intravenous contrast. Automated exposure control, iterative reconstruction, and/or weight based adjustment of the mA/kV was utilized to reduce the radiation dose to as low as reasonably achievable. COMPARISON: CT head 03/01/2023 CLINICAL HISTORY: Mental status change, unknown cause FINDINGS: BRAIN AND VENTRICLES: No acute hemorrhage. No evidence of acute infarct. No hydrocephalus. No extra-axial collection. No mass effect or midline shift. Patchy  white matter hypoattenuations, compatible with chronic and vascular ischemic change. ORBITS: No acute abnormality. SINUSES: No acute abnormality. SOFT TISSUES AND SKULL: No acute soft tissue abnormality. No skull fracture. IMPRESSION: 1. No acute intracranial abnormality. Electronically signed by: Gilmore Molt MD 07/07/2024 07:16 PM EST RP Workstation: [id number]   DG Chest Portable 1 View Result Date: 07/07/2024 CLINICAL DATA:  Altered level of consciousness EXAM: PORTABLE CHEST 1 VIEW COMPARISON:  09/28/2023 FINDINGS: Single frontal view of the chest demonstrates an unremarkable cardiac silhouette. No acute airspace disease, effusion, or pneumothorax. No acute bony abnormalities. IMPRESSION: 1. No acute intrathoracic process. Electronically Signed   By: Ozell Daring M.D.   On: 07/07/2024 19:05   Subjective: He is obtunded, nonverbal, per staff no significant events overnight, Foley catheter was inserted for comfort measures yesterday  Discharge Exam: Vitals:   07/15/24 0644 07/15/24 0803  BP:  90/68  Pulse:    Resp: 17   Temp:  98.6 F (37 C)  SpO2:     Vitals:   07/15/24 0448 07/15/24 0550 07/15/24 0644 07/15/24 0803  BP:    90/68  Pulse:      Resp: (!) 22 15 17    Temp:    98.6 F (37 C)  TempSrc:      SpO2:      Weight:      Height:        General: Patient is somnolent, appears comfortable in no apparent distress Cardiovascular: RRR Respiratory: Good air entry bilaterally Abdominal: Soft Extremities: no edema    The results of significant diagnostics from this hospitalization (including imaging, microbiology, ancillary and laboratory) are listed below for reference.     Microbiology: Recent Results (from the past 240 hours)  Resp panel by RT-PCR (RSV, Flu A&B, Covid) Anterior Nasal Swab     Status: None   Collection Time: 07/07/24  8:06 PM   Specimen: Anterior Nasal Swab  Result Value Ref Range Status   SARS Coronavirus 2 by RT PCR NEGATIVE NEGATIVE Final  Influenza A by PCR NEGATIVE NEGATIVE Final   Influenza B by PCR NEGATIVE NEGATIVE Final    Comment: (NOTE) The Xpert Xpress SARS-CoV-2/FLU/RSV plus assay is intended as an aid in the diagnosis of influenza from Nasopharyngeal swab specimens and should not be used as a sole basis for treatment. Nasal washings and aspirates are unacceptable for Xpert Xpress SARS-CoV-2/FLU/RSV testing.  Fact Sheet for Patients: bloggercourse.com  Fact Sheet for Healthcare Providers: seriousbroker.it  This test is not yet approved or cleared by the [place]  FDA and has been authorized for detection and/or diagnosis of SARS-CoV-2 by FDA under an Emergency Use Authorization (EUA). This EUA will remain in effect (meaning this test can be used) for the duration of the COVID-19 declaration under Section 564(b)(1) of the Act, 21 U.S.C. section 360bbb-3(b)(1), unless the authorization is terminated or revoked.     Resp Syncytial Virus by PCR NEGATIVE NEGATIVE Final    Comment: (NOTE) Fact Sheet for Patients: bloggercourse.com  Fact Sheet for Healthcare Providers: seriousbroker.it  This test is not yet approved or cleared by the [place]  FDA and has been authorized for detection and/or diagnosis of SARS-CoV-2 by FDA under an Emergency Use Authorization (EUA). This EUA will remain in effect (meaning this test can be used) for the duration of the COVID-19 declaration under Section 564(b)(1) of the Act, 21 U.S.C. section 360bbb-3(b)(1), unless the authorization is terminated or revoked.  Performed at [facility], [address], [place], [place] [address]   Urine Culture (for pregnant, neutropenic or urologic patients or patients with an indwelling urinary catheter)     Status: None   Collection Time: 07/07/24 11:38 PM   Specimen: Urine, Clean Catch  Result Value Ref Range Status    Specimen Description URINE, CLEAN CATCH  Final   Special Requests NONE  Final   Culture   Final    NO GROWTH Performed at [facility], [address], [place], [place] [address]    Report Status 07/09/2024 FINAL  Final  Culture, blood (Routine X 2) w Reflex to ID Panel     Status: Abnormal   Collection Time: 07/11/24  2:51 AM   Specimen: BLOOD  Result Value Ref Range Status   Specimen Description BLOOD RIGHT ANTECUBITAL  Final   Special Requests   Final    BOTTLES DRAWN AEROBIC AND ANAEROBIC Blood Culture results may not be optimal due to an inadequate volume of blood received in culture bottles   Culture  Setup Time   Final    GRAM POSITIVE COCCI ANAEROBIC BOTTLE ONLY CRITICAL VALUE NOTED.  VALUE IS CONSISTENT WITH PREVIOUSLY REPORTED AND CALLED VALUE. Performed at [facility], [address] [place], [place] [address]    Culture STAPHYLOCOCCUS EPIDERMIDIS (A)  Final   Report Status 07/15/2024 FINAL  Final   Organism ID, Bacteria STAPHYLOCOCCUS EPIDERMIDIS  Final      Susceptibility   Staphylococcus epidermidis - MIC*    CIPROFLOXACIN  <=0.5 SENSITIVE Sensitive     ERYTHROMYCIN  >=8 RESISTANT Resistant     GENTAMICIN  <=0.5 SENSITIVE Sensitive     OXACILLIN <=0.25 SENSITIVE Sensitive     TETRACYCLINE <=1 SENSITIVE Sensitive     VANCOMYCIN  2 SENSITIVE Sensitive     TRIMETH/SULFA <=10 SENSITIVE Sensitive     CLINDAMYCIN >=8 RESISTANT Resistant     RIFAMPIN <=0.5 SENSITIVE Sensitive     Inducible Clindamycin NEGATIVE Sensitive     * STAPHYLOCOCCUS EPIDERMIDIS  Resp panel by RT-PCR (RSV, Flu A&B,  Covid) Anterior Nasal Swab     Status: None   Collection Time: 07/11/24  2:52 AM   Specimen: Anterior Nasal Swab  Result Value Ref Range Status   SARS Coronavirus 2 by RT PCR NEGATIVE NEGATIVE Final   Influenza A by PCR NEGATIVE NEGATIVE Final   Influenza B by PCR NEGATIVE NEGATIVE Final    Comment: (NOTE) The Xpert Xpress SARS-CoV-2/FLU/RSV plus assay is intended as an  aid in the diagnosis of influenza from Nasopharyngeal swab specimens and should not be used as a sole basis for treatment. Nasal washings and aspirates are unacceptable for Xpert Xpress SARS-CoV-2/FLU/RSV testing.  Fact Sheet for Patients: bloggercourse.com  Fact Sheet for Healthcare Providers: seriousbroker.it  This test is not yet approved or cleared by the [place]  FDA and has been authorized for detection and/or diagnosis of SARS-CoV-2 by FDA under an Emergency Use Authorization (EUA). This EUA will remain in effect (meaning this test can be used) for the duration of the COVID-19 declaration under Section 564(b)(1) of the Act, 21 U.S.C. section 360bbb-3(b)(1), unless the authorization is terminated or revoked.     Resp Syncytial Virus by PCR NEGATIVE NEGATIVE Final    Comment: (NOTE) Fact Sheet for Patients: bloggercourse.com  Fact Sheet for Healthcare Providers: seriousbroker.it  This test is not yet approved or cleared by the [place]  FDA and has been authorized for detection and/or diagnosis of SARS-CoV-2 by FDA under an Emergency Use Authorization (EUA). This EUA will remain in effect (meaning this test can be used) for the duration of the COVID-19 declaration under Section 564(b)(1) of the Act, 21 U.S.C. section 360bbb-3(b)(1), unless the authorization is terminated or revoked.  Performed at [facility], [address] [place], [place] [address]   Culture, blood (Routine X 2) w Reflex to ID Panel     Status: Abnormal   Collection Time: 07/11/24  2:56 AM   Specimen: BLOOD  Result Value Ref Range Status   Specimen Description BLOOD LEFT ANTECUBITAL  Final   Special Requests   Final    BOTTLES DRAWN AEROBIC AND ANAEROBIC Blood Culture results may not be optimal due to an inadequate volume of blood received in culture bottles   Culture  Setup Time    Final    GRAM POSITIVE COCCI IN BOTH AEROBIC AND ANAEROBIC BOTTLES CRITICAL RESULT CALLED TO, READ BACK BY AND VERIFIED WITH: PHARMD J. LEDDFORD [id number] @0029  FH    Culture (A)  Final    STAPHYLOCOCCUS EPIDERMIDIS STAPHYLOCOCCUS CAPITIS THE SIGNIFICANCE OF ISOLATING THIS ORGANISM FROM A SINGLE SET OF BLOOD CULTURES WHEN MULTIPLE SETS ARE DRAWN IS UNCERTAIN. PLEASE NOTIFY THE MICROBIOLOGY DEPARTMENT WITHIN ONE WEEK IF SPECIATION AND SENSITIVITIES ARE REQUIRED. Performed at [facility], [address], [place], [place] [address]    Report Status 07/13/2024 FINAL  Final  Blood Culture ID Panel (Reflexed)     Status: Abnormal   Collection Time: 07/11/24  2:56 AM  Result Value Ref Range Status   Enterococcus faecalis NOT DETECTED NOT DETECTED Final   Enterococcus Faecium NOT DETECTED NOT DETECTED Final   Listeria monocytogenes NOT DETECTED NOT DETECTED Final   Staphylococcus species DETECTED (A) NOT DETECTED Final    Comment: CRITICAL RESULT CALLED TO, READ BACK BY AND VERIFIED WITH: PHARMD J. OZIQNMI [id number] @0029  FH    Staphylococcus aureus (BCID) NOT DETECTED NOT DETECTED Final   Staphylococcus epidermidis DETECTED (A) NOT DETECTED Final    Comment: Methicillin (oxacillin) resistant coagulase negative staphylococcus. Possible blood culture  contaminant (unless isolated from more than one blood culture draw or clinical case suggests pathogenicity). No antibiotic treatment is indicated for blood  culture contaminants. CRITICAL RESULT CALLED TO, READ BACK BY AND VERIFIED WITH: PHARMD J. OZIQNMI [id number] @0029  FH    Staphylococcus lugdunensis NOT DETECTED NOT DETECTED Final   Streptococcus species NOT DETECTED NOT DETECTED Final   Streptococcus agalactiae NOT DETECTED NOT DETECTED Final   Streptococcus pneumoniae NOT DETECTED NOT DETECTED Final   Streptococcus pyogenes NOT DETECTED NOT DETECTED Final   A.calcoaceticus-baumannii NOT DETECTED NOT DETECTED Final   Bacteroides fragilis NOT  DETECTED NOT DETECTED Final   Enterobacterales NOT DETECTED NOT DETECTED Final   Enterobacter cloacae complex NOT DETECTED NOT DETECTED Final   Escherichia coli NOT DETECTED NOT DETECTED Final   Klebsiella aerogenes NOT DETECTED NOT DETECTED Final   Klebsiella oxytoca NOT DETECTED NOT DETECTED Final   Klebsiella pneumoniae NOT DETECTED NOT DETECTED Final   Proteus species NOT DETECTED NOT DETECTED Final   Salmonella species NOT DETECTED NOT DETECTED Final   Serratia marcescens NOT DETECTED NOT DETECTED Final   Haemophilus influenzae NOT DETECTED NOT DETECTED Final   Neisseria meningitidis NOT DETECTED NOT DETECTED Final   Pseudomonas aeruginosa NOT DETECTED NOT DETECTED Final   Stenotrophomonas maltophilia NOT DETECTED NOT DETECTED Final   Candida albicans NOT DETECTED NOT DETECTED Final   Candida auris NOT DETECTED NOT DETECTED Final   Candida glabrata NOT DETECTED NOT DETECTED Final   Candida krusei NOT DETECTED NOT DETECTED Final   Candida parapsilosis NOT DETECTED NOT DETECTED Final   Candida tropicalis NOT DETECTED NOT DETECTED Final   Cryptococcus neoformans/gattii NOT DETECTED NOT DETECTED Final   Methicillin resistance mecA/C DETECTED (A) NOT DETECTED Final    Comment: CRITICAL RESULT CALLED TO, READ BACK BY AND VERIFIED WITH: PHARMD JSABRA OZIQNMI [id number] @0029  FH Performed at [facility], [address], [place], [place] [address]   Culture, blood (Routine X 2) w Reflex to ID Panel     Status: None (Preliminary result)   Collection Time: 07/12/24  5:02 PM   Specimen: BLOOD RIGHT HAND  Result Value Ref Range Status   Specimen Description BLOOD RIGHT HAND  Final   Special Requests   Final    BOTTLES DRAWN AEROBIC AND ANAEROBIC Blood Culture results may not be optimal due to an inadequate volume of blood received in culture bottles   Culture   Final    NO GROWTH 3 DAYS Performed at [facility], [address] [place], [place] [address]    Report Status PENDING   Incomplete     Labs: BNP (last 3 results) Recent Labs    08/24/23 1236 09/25/23 2212 07/12/24 0206  BNP 266.9* 488.2* 600.5*   Basic Metabolic Panel: Recent Labs  Lab 07/11/24 0250 07/11/24 0510 07/12/24 0206 07/12/24 1943 07/13/24 0610 07/13/24 0803 07/14/24 0304  NA 147* 152* 151* 152* 149*  --  147*  K 3.8 3.5 3.6 3.3* 3.4*  --  3.4*  CL 116*  --  120* 117* 115*  --  116*  CO2 21*  --  19* 21* 20*  --  21*  GLUCOSE 138*  --  164* 180* 178*  --  156*  BUN 31*  --  23 19 15   --  12  CREATININE 1.46*  --  1.34* 1.20 1.25*  --  1.32*  CALCIUM  8.8*  --  9.1 8.8* 8.7*  --  8.1*  MG  --   --  2.1  --   --  2.3 2.3  PHOS  --   --   --   --   --   --  2.0*   Liver Function Tests: Recent Labs  Lab 07/09/24 0333 07/10/24 0432 07/11/24 0250  AST 47* 35 32  ALT 57* 52* 48*  ALKPHOS 121 117 122  BILITOT 0.5 0.4 0.4  PROT 6.2* 6.7 7.2  ALBUMIN 2.2* 2.3* 2.6*   No results for input(s): LIPASE, AMYLASE in the last 168 hours. Recent Labs  Lab 07/11/24 0251  AMMONIA 16   CBC: Recent Labs  Lab 07/09/24 0333 07/11/24 0250 07/11/24 0510 07/12/24 0206 07/13/24 0610 07/14/24 0304  WBC 7.9 11.6*  --  11.6* 17.6* 15.7*  NEUTROABS  --  9.7*  --   --   --   --   HGB 10.0* 11.5* 9.5* 11.2* 11.6* 10.5*  HCT 31.2* 35.7* 28.0* 35.2* 35.9* 32.4*  MCV 90.4 91.8  --  93.1 90.7 91.8  PLT 228 269  --  252 269 238   Cardiac Enzymes: No results for input(s): CKTOTAL, CKMB, CKMBINDEX, TROPONINI in the last 168 hours. BNP: Invalid input(s): POCBNP CBG: Recent Labs  Lab 07/14/24 0008 07/14/24 0425 07/14/24 0807 07/14/24 1302 07/14/24 1554  GLUCAP 162* 156* 163* 208* 161*   D-Dimer No results for input(s): DDIMER in the last 72 hours. Hgb A1c No results for input(s): HGBA1C in the last 72 hours. Lipid Profile No results for input(s): CHOL, HDL, LDLCALC, TRIG, CHOLHDL, LDLDIRECT in the last 72 hours. Thyroid  function studies No results for  input(s): TSH, T4TOTAL, T3FREE, THYROIDAB in the last 72 hours.  Invalid input(s): FREET3 Anemia work up No results for input(s): VITAMINB12, FOLATE, FERRITIN, TIBC, IRON , RETICCTPCT in the last 72 hours. Urinalysis    Component Value Date/Time   COLORURINE YELLOW 07/13/2024 0812   APPEARANCEUR HAZY (A) 07/13/2024 0812   LABSPEC 1.014 07/13/2024 0812   PHURINE 5.0 07/13/2024 0812   GLUCOSEU 150 (A) 07/13/2024 0812   HGBUR SMALL (A) 07/13/2024 0812   BILIRUBINUR NEGATIVE 07/13/2024 0812   KETONESUR 5 (A) 07/13/2024 0812   PROTEINUR 30 (A) 07/13/2024 0812   UROBILINOGEN 0.2 10/28/2010 0909   NITRITE NEGATIVE 07/13/2024 0812   LEUKOCYTESUR NEGATIVE 07/13/2024 0812   Sepsis Labs Recent Labs  Lab 07/11/24 0250 07/12/24 0206 07/13/24 0610 07/14/24 0304  WBC 11.6* 11.6* 17.6* 15.7*   Microbiology Recent Results (from the past 240 hours)  Resp panel by RT-PCR (RSV, Flu A&B, Covid) Anterior Nasal Swab     Status: None   Collection Time: 07/07/24  8:06 PM   Specimen: Anterior Nasal Swab  Result Value Ref Range Status   SARS Coronavirus 2 by RT PCR NEGATIVE NEGATIVE Final   Influenza A by PCR NEGATIVE NEGATIVE Final   Influenza B by PCR NEGATIVE NEGATIVE Final    Comment: (NOTE) The Xpert Xpress SARS-CoV-2/FLU/RSV plus assay is intended as an aid in the diagnosis of influenza from Nasopharyngeal swab specimens and should not be used as a sole basis for treatment. Nasal washings and aspirates are unacceptable for Xpert Xpress SARS-CoV-2/FLU/RSV testing.  Fact Sheet for Patients: bloggercourse.com  Fact Sheet for Healthcare Providers: seriousbroker.it  This test is not yet approved or cleared by the [place]  FDA and has been authorized for detection and/or diagnosis of SARS-CoV-2 by FDA under an Emergency Use Authorization (EUA). This EUA will remain in effect (meaning this test can be used) for the  duration of the COVID-19 declaration under  Section 564(b)(1) of the Act, 21 U.S.C. section 360bbb-3(b)(1), unless the authorization is terminated or revoked.     Resp Syncytial Virus by PCR NEGATIVE NEGATIVE Final    Comment: (NOTE) Fact Sheet for Patients: bloggercourse.com  Fact Sheet for Healthcare Providers: seriousbroker.it  This test is not yet approved or cleared by the [place]  FDA and has been authorized for detection and/or diagnosis of SARS-CoV-2 by FDA under an Emergency Use Authorization (EUA). This EUA will remain in effect (meaning this test can be used) for the duration of the COVID-19 declaration under Section 564(b)(1) of the Act, 21 U.S.C. section 360bbb-3(b)(1), unless the authorization is terminated or revoked.  Performed at [facility], [address] [place], [place] [address]   Urine Culture (for pregnant, neutropenic or urologic patients or patients with an indwelling urinary catheter)     Status: None   Collection Time: 07/07/24 11:38 PM   Specimen: Urine, Clean Catch  Result Value Ref Range Status   Specimen Description URINE, CLEAN CATCH  Final   Special Requests NONE  Final   Culture   Final    NO GROWTH Performed at [facility], [address], [place], [place] [address]    Report Status 07/09/2024 FINAL  Final  Culture, blood (Routine X 2) w Reflex to ID Panel     Status: Abnormal   Collection Time: 07/11/24  2:51 AM   Specimen: BLOOD  Result Value Ref Range Status   Specimen Description BLOOD RIGHT ANTECUBITAL  Final   Special Requests   Final    BOTTLES DRAWN AEROBIC AND ANAEROBIC Blood Culture results may not be optimal due to an inadequate volume of blood received in culture bottles   Culture  Setup Time   Final    GRAM POSITIVE COCCI ANAEROBIC BOTTLE ONLY CRITICAL VALUE NOTED.  VALUE IS CONSISTENT WITH PREVIOUSLY REPORTED AND CALLED VALUE. Performed at [facility], [address], [place], [place] [address]    Culture STAPHYLOCOCCUS EPIDERMIDIS (A)  Final   Report Status 07/15/2024 FINAL  Final   Organism ID, Bacteria STAPHYLOCOCCUS EPIDERMIDIS  Final      Susceptibility   Staphylococcus epidermidis - MIC*    CIPROFLOXACIN  <=0.5 SENSITIVE Sensitive     ERYTHROMYCIN  >=8 RESISTANT Resistant     GENTAMICIN  <=0.5 SENSITIVE Sensitive     OXACILLIN <=0.25 SENSITIVE Sensitive     TETRACYCLINE <=1 SENSITIVE Sensitive     VANCOMYCIN  2 SENSITIVE Sensitive     TRIMETH/SULFA <=10 SENSITIVE Sensitive     CLINDAMYCIN >=8 RESISTANT Resistant     RIFAMPIN <=0.5 SENSITIVE Sensitive     Inducible Clindamycin NEGATIVE Sensitive     * STAPHYLOCOCCUS EPIDERMIDIS  Resp panel by RT-PCR (RSV, Flu A&B, Covid) Anterior Nasal Swab     Status: None   Collection Time: 07/11/24  2:52 AM   Specimen: Anterior Nasal Swab  Result Value Ref Range Status   SARS Coronavirus 2 by RT PCR NEGATIVE NEGATIVE Final   Influenza A by PCR NEGATIVE NEGATIVE Final   Influenza B by PCR NEGATIVE NEGATIVE Final    Comment: (NOTE) The Xpert Xpress SARS-CoV-2/FLU/RSV plus assay is intended as an aid in the diagnosis of influenza from Nasopharyngeal swab specimens and should not be used as a sole basis for treatment. Nasal washings and aspirates are unacceptable for Xpert Xpress SARS-CoV-2/FLU/RSV testing.  Fact Sheet for Patients: bloggercourse.com  Fact Sheet for Healthcare Providers: seriousbroker.it  This test is not yet approved or cleared  by the [place]  FDA and has been authorized for detection and/or diagnosis of SARS-CoV-2 by FDA under an Emergency Use Authorization (EUA). This EUA will remain in effect (meaning this test can be used) for the duration of the COVID-19 declaration under Section 564(b)(1) of the Act, 21 U.S.C. section 360bbb-3(b)(1), unless the authorization is terminated or revoked.     Resp  Syncytial Virus by PCR NEGATIVE NEGATIVE Final    Comment: (NOTE) Fact Sheet for Patients: bloggercourse.com  Fact Sheet for Healthcare Providers: seriousbroker.it  This test is not yet approved or cleared by the [place]  FDA and has been authorized for detection and/or diagnosis of SARS-CoV-2 by FDA under an Emergency Use Authorization (EUA). This EUA will remain in effect (meaning this test can be used) for the duration of the COVID-19 declaration under Section 564(b)(1) of the Act, 21 U.S.C. section 360bbb-3(b)(1), unless the authorization is terminated or revoked.  Performed at [facility], [address], [place], [place] [address]   Culture, blood (Routine X 2) w Reflex to ID Panel     Status: Abnormal   Collection Time: 07/11/24  2:56 AM   Specimen: BLOOD  Result Value Ref Range Status   Specimen Description BLOOD LEFT ANTECUBITAL  Final   Special Requests   Final    BOTTLES DRAWN AEROBIC AND ANAEROBIC Blood Culture results may not be optimal due to an inadequate volume of blood received in culture bottles   Culture  Setup Time   Final    GRAM POSITIVE COCCI IN BOTH AEROBIC AND ANAEROBIC BOTTLES CRITICAL RESULT CALLED TO, READ BACK BY AND VERIFIED WITH: PHARMD J. LEDDFORD [id number] @0029  FH    Culture (A)  Final    STAPHYLOCOCCUS EPIDERMIDIS STAPHYLOCOCCUS CAPITIS THE SIGNIFICANCE OF ISOLATING THIS ORGANISM FROM A SINGLE SET OF BLOOD CULTURES WHEN MULTIPLE SETS ARE DRAWN IS UNCERTAIN. PLEASE NOTIFY THE MICROBIOLOGY DEPARTMENT WITHIN ONE WEEK IF SPECIATION AND SENSITIVITIES ARE REQUIRED. Performed at [facility], [address], [place], [place] [address]    Report Status 07/13/2024 FINAL  Final  Blood Culture ID Panel (Reflexed)     Status: Abnormal   Collection Time: 07/11/24  2:56 AM  Result Value Ref Range Status   Enterococcus faecalis NOT DETECTED NOT DETECTED Final   Enterococcus Faecium NOT  DETECTED NOT DETECTED Final   Listeria monocytogenes NOT DETECTED NOT DETECTED Final   Staphylococcus species DETECTED (A) NOT DETECTED Final    Comment: CRITICAL RESULT CALLED TO, READ BACK BY AND VERIFIED WITH: PHARMD J. OZIQNMI [id number] @0029  FH    Staphylococcus aureus (BCID) NOT DETECTED NOT DETECTED Final   Staphylococcus epidermidis DETECTED (A) NOT DETECTED Final    Comment: Methicillin (oxacillin) resistant coagulase negative staphylococcus. Possible blood culture contaminant (unless isolated from more than one blood culture draw or clinical case suggests pathogenicity). No antibiotic treatment is indicated for blood  culture contaminants. CRITICAL RESULT CALLED TO, READ BACK BY AND VERIFIED WITH: PHARMD J. OZIQNMI [id number] @0029  FH    Staphylococcus lugdunensis NOT DETECTED NOT DETECTED Final   Streptococcus species NOT DETECTED NOT DETECTED Final   Streptococcus agalactiae NOT DETECTED NOT DETECTED Final   Streptococcus pneumoniae NOT DETECTED NOT DETECTED Final   Streptococcus pyogenes NOT DETECTED NOT DETECTED Final   A.calcoaceticus-baumannii NOT DETECTED NOT DETECTED Final   Bacteroides fragilis NOT DETECTED NOT DETECTED Final   Enterobacterales NOT DETECTED NOT DETECTED Final   Enterobacter cloacae complex NOT DETECTED NOT DETECTED Final   Escherichia coli NOT DETECTED  NOT DETECTED Final   Klebsiella aerogenes NOT DETECTED NOT DETECTED Final   Klebsiella oxytoca NOT DETECTED NOT DETECTED Final   Klebsiella pneumoniae NOT DETECTED NOT DETECTED Final   Proteus species NOT DETECTED NOT DETECTED Final   Salmonella species NOT DETECTED NOT DETECTED Final   Serratia marcescens NOT DETECTED NOT DETECTED Final   Haemophilus influenzae NOT DETECTED NOT DETECTED Final   Neisseria meningitidis NOT DETECTED NOT DETECTED Final   Pseudomonas aeruginosa NOT DETECTED NOT DETECTED Final   Stenotrophomonas maltophilia NOT DETECTED NOT DETECTED Final   Candida albicans NOT DETECTED NOT  DETECTED Final   Candida auris NOT DETECTED NOT DETECTED Final   Candida glabrata NOT DETECTED NOT DETECTED Final   Candida krusei NOT DETECTED NOT DETECTED Final   Candida parapsilosis NOT DETECTED NOT DETECTED Final   Candida tropicalis NOT DETECTED NOT DETECTED Final   Cryptococcus neoformans/gattii NOT DETECTED NOT DETECTED Final   Methicillin resistance mecA/C DETECTED (A) NOT DETECTED Final    Comment: CRITICAL RESULT CALLED TO, READ BACK BY AND VERIFIED WITH: MAYA ALF OZIQNMI [id number] @0029  FH Performed at [facility], [address], [place], [place] [address]   Culture, blood (Routine X 2) w Reflex to ID Panel     Status: None (Preliminary result)   Collection Time: 07/12/24  5:02 PM   Specimen: BLOOD RIGHT HAND  Result Value Ref Range Status   Specimen Description BLOOD RIGHT HAND  Final   Special Requests   Final    BOTTLES DRAWN AEROBIC AND ANAEROBIC Blood Culture results may not be optimal due to an inadequate volume of blood received in culture bottles   Culture   Final    NO GROWTH 3 DAYS Performed at [facility], [address], [place], [place] [address]    Report Status PENDING  Incomplete     Time coordinating discharge: Over 30 minutes  SIGNED:   Brayton Lye, MD  [facility] 07/15/2024, 1:42 PM Pager   If 7PM-7AM, please contact night-coverage [email or web address]     [1]  Allergies Allergen Reactions   Lisinopril Cough   Niaspan [Niacin Er (Antihyperlipidemic)] Rash

## 2024-07-15 NOTE — Progress Notes (Signed)
 Pt resting comfortably. Wife at bedside.   VO given to remove cortrak by MD.

## 2024-07-15 NOTE — TOC Progression Note (Signed)
 Transition of Care [facility]) - Progression Note    Patient Details  Name: Nathan Santiago MRN: [id number] Date of Birth: 1931-07-25  Transition of Care [facility]) CM/SW Contact  [name], LCSW Phone Number: 07/15/2024, 9:18 AM  Clinical Narrative:    CSW received consult for hospice facility placement. Patient's wife's preference is [facility] due to location. CSW sent referral for review.    Expected Discharge Plan: Hospice Medical Facility Barriers to Discharge:  (Hospice evaluation)               Expected Discharge Plan and Services In-house Referral: Clinical Social Work     Living arrangements for the past 2 months: Skilled Nursing Facility                                       Social Drivers of Health (SDOH) Interventions SDOH Screenings   Food Insecurity: Patient Unable To Answer (07/08/2024)  Housing: Patient Unable To Answer (07/08/2024)  Transportation Needs: No Transportation Needs (07/13/2024)  Utilities: Not At Risk (07/13/2024)  Alcohol  Screen: Low Risk (11/28/2022)  Depression (PHQ2-9): Low Risk (01/11/2024)  Financial Resource Strain: Low Risk (11/28/2022)  Physical Activity: Insufficiently Active (11/28/2022)  Social Connections: Moderately Integrated (07/13/2024)  Stress: No Stress Concern Present (11/28/2022)  Tobacco Use: Medium Risk (07/13/2024)    Readmission Risk Interventions    07/08/2024   11:44 AM 07/10/2022    2:59 PM  Readmission Risk Prevention Plan  Post Dischage Appt  Complete  Medication Screening  Complete  Transportation Screening Complete Complete  PCP or Specialist Appt within 5-7 Days Complete   Home Care Screening Complete   Medication Review (RN CM) Referral to Pharmacy

## 2024-07-15 NOTE — TOC Transition Note (Signed)
 Transition of Care [facility]) - Discharge Note   Patient Details  Name: Nathan Santiago MRN: [id number] Date of Birth: [birth year]  Transition of Care [facility]) CM/SW Contact:  [name], LCSW Phone Number: 07/15/2024, 3:30 PM   Clinical Narrative:    Patient will DC to: [facility] Anticipated DC date: 07/15/24 Family notified: Spouse at bedside Transport by: [facility]   Per MD patient ready for DC to [facility]. RN to call report prior to discharge [phone]). RN, patient, patient's family, and facility notified of DC. Discharge Summary sent to facility. DC packet on chart including signed DNR. Ambulance transport requested for patient.   CSW will sign off for now as social work intervention is no longer needed. Please consult us  again if new needs arise.     Final next level of care: Hospice Medical Facility Barriers to Discharge: Barriers Resolved   Patient Goals and CMS Choice Patient states their goals for this hospitalization and ongoing recovery are:: comfort CMS Medicare.gov Compare Post Acute Care list provided to:: Patient Represenative (must comment) Choice offered to / list presented to : Spouse      Discharge Placement              Patient chooses bed at: Other - please specify in the comment section below: [facility]) Patient to be transferred to facility by: PTAR Name of family member notified: Spouse Patient and family notified of of transfer: 07/15/24  Discharge Plan and Services Additional resources added to the After Visit Summary for   In-house Referral: Clinical Social Work                                   Social Drivers of Health (SDOH) Interventions SDOH Screenings   Food Insecurity: Patient Unable To Answer (07/08/2024)  Housing: Patient Unable To Answer (07/08/2024)  Transportation Needs: No Transportation Needs (07/13/2024)  Utilities: Not At Risk (07/13/2024)  Alcohol  Screen: Low Risk (11/28/2022)   Depression (PHQ2-9): Low Risk (01/11/2024)  Financial Resource Strain: Low Risk (11/28/2022)  Physical Activity: Insufficiently Active (11/28/2022)  Social Connections: Moderately Integrated (07/13/2024)  Stress: No Stress Concern Present (11/28/2022)  Tobacco Use: Medium Risk (07/13/2024)     Readmission Risk Interventions    07/08/2024   11:44 AM 07/10/2022    2:59 PM  Readmission Risk Prevention Plan  Post Dischage Appt  Complete  Medication Screening  Complete  Transportation Screening Complete Complete  PCP or Specialist Appt within 5-7 Days Complete   Home Care Screening Complete   Medication Review (RN CM) Referral to Pharmacy

## 2024-07-15 NOTE — Discharge Instructions (Signed)
 Management per [facility]

## 2024-07-15 NOTE — Progress Notes (Signed)
 Pt going to [facility]... [facility] arrived   Was asked by staff at [facility] to leave all PIVs.

## 2024-07-15 NOTE — Plan of Care (Signed)
°  Problem: Education: Goal: Knowledge of General Education information will improve Description: Including pain rating scale, medication(s)/side effects and non-pharmacologic comfort measures Outcome: Not Applicable   Problem: Health Behavior/Discharge Planning: Goal: Ability to manage health-related needs will improve Outcome: Not Applicable   Problem: Clinical Measurements: Goal: Ability to maintain clinical measurements within normal limits will improve Outcome: Not Applicable Goal: Will remain free from infection Outcome: Not Applicable Goal: Diagnostic test results will improve Outcome: Not Applicable Goal: Respiratory complications will improve Outcome: Not Applicable Goal: Cardiovascular complication will be avoided Outcome: Not Applicable   Problem: Activity: Goal: Risk for activity intolerance will decrease Outcome: Not Applicable   Problem: Nutrition: Goal: Adequate nutrition will be maintained Outcome: Not Applicable   Problem: Coping: Goal: Level of anxiety will decrease Outcome: Not Applicable   Problem: Elimination: Goal: Will not experience complications related to bowel motility Outcome: Not Applicable Goal: Will not experience complications related to urinary retention Outcome: Not Applicable   Problem: Pain Managment: Goal: General experience of comfort will improve and/or be controlled Outcome: Not Applicable   Problem: Safety: Goal: Ability to remain free from injury will improve Outcome: Not Applicable   Problem: Skin Integrity: Goal: Risk for impaired skin integrity will decrease Outcome: Not Applicable   Problem: Education: Goal: Ability to describe self-care measures that may prevent or decrease complications (Diabetes Survival Skills Education) will improve Outcome: Not Applicable Goal: Individualized Educational Video(s) Outcome: Not Applicable   Problem: Coping: Goal: Ability to adjust to condition or change in health will  improve Outcome: Not Applicable   Problem: Fluid Volume: Goal: Ability to maintain a balanced intake and output will improve Outcome: Not Applicable   Problem: Metabolic: Goal: Ability to maintain appropriate glucose levels will improve Outcome: Not Applicable   Problem: Health Behavior/Discharge Planning: Goal: Ability to identify and utilize available resources and services will improve Outcome: Not Applicable Goal: Ability to manage health-related needs will improve Outcome: Not Applicable   Problem: Nutritional: Goal: Maintenance of adequate nutrition will improve Outcome: Not Applicable Goal: Progress toward achieving an optimal weight will improve Outcome: Not Applicable   Problem: Skin Integrity: Goal: Risk for impaired skin integrity will decrease Outcome: Not Applicable   Problem: Tissue Perfusion: Goal: Adequacy of tissue perfusion will improve Outcome: Not Applicable   Problem: Education: Goal: Knowledge of the prescribed therapeutic regimen will improve Outcome: Not Applicable   Problem: Coping: Goal: Ability to identify and develop effective coping behavior will improve Outcome: Not Applicable   Problem: Clinical Measurements: Goal: Quality of life will improve Outcome: Not Applicable   Problem: Respiratory: Goal: Verbalizations of increased ease of respirations will increase Outcome: Not Applicable   Problem: Role Relationship: Goal: Family's ability to cope with current situation will improve Outcome: Not Applicable Goal: Ability to verbalize concerns, feelings, and thoughts to partner or family member will improve Outcome: Not Applicable   Problem: Pain Management: Goal: Satisfaction with pain management regimen will improve Outcome: Not Applicable

## 2024-07-15 NOTE — Progress Notes (Signed)
 This chaplain responded to Dr. Trude consult for EOL spiritual care. The Pt. is resting comfortably with his wife and grandchildren at the bedside. The wife declined a spiritual care presence. The chaplain understands from the Pt. wife she hopes for a transition quickly to [facility] so family can be present with the Pt.  This chaplain is available for F/U spiritual care as needed.  Chaplain Leeroy Hummer [phone]

## 2024-07-15 NOTE — Progress Notes (Signed)
 Nathan Santiago [phone] [facility] hospital liaison note   Referral received from [facility] for family interest in [facility].   Met with patient and his wife Nathan Santiago at the bedside to explain services and hospice philosophy and all questions answered.  [facility] is able to accept patient this afternoon once consents are complete.    RN staff, you may call report at any time to [phone], room is assigned when report is called.  Please leave IV intact and send completed DNR with patient.   Updated attending and [facility] manager via [employer].  Thank you for the opportunity to participate in this patient's care  Amy Darien BSN, RN [facility] Liaison [phone]

## 2024-07-15 NOTE — Progress Notes (Signed)
 Attempted to call [facility] but nurses were busy. Call back number provided to [facility]

## 2024-07-16 ENCOUNTER — Ambulatory Visit (HOSPITAL_COMMUNITY)

## 2024-07-16 ENCOUNTER — Ambulatory Visit

## 2024-07-17 LAB — CULTURE, BLOOD (ROUTINE X 2): Culture: NO GROWTH

## 2024-07-21 ENCOUNTER — Encounter: Payer: Self-pay | Admitting: Internal Medicine

## 2024-07-21 ENCOUNTER — Non-Acute Institutional Stay (SKILLED_NURSING_FACILITY): Admitting: Internal Medicine

## 2024-07-21 DIAGNOSIS — I480 Paroxysmal atrial fibrillation: Secondary | ICD-10-CM | POA: Diagnosis not present

## 2024-07-21 DIAGNOSIS — G9341 Metabolic encephalopathy: Secondary | ICD-10-CM | POA: Diagnosis not present

## 2024-07-21 DIAGNOSIS — I739 Peripheral vascular disease, unspecified: Secondary | ICD-10-CM | POA: Diagnosis not present

## 2024-07-21 DIAGNOSIS — N179 Acute kidney failure, unspecified: Secondary | ICD-10-CM

## 2024-07-21 DIAGNOSIS — I639 Cerebral infarction, unspecified: Secondary | ICD-10-CM | POA: Diagnosis not present

## 2024-07-21 NOTE — Progress Notes (Signed)
 "  Location:  Medical Illustrator of Service:  SNF (31)  Provider:   Code Status: DNR/hospice Goals of Care:     07/13/2024   12:19 PM  Advanced Directives  Does patient want to make changes to medical advance directive? No - Guardian declined     Chief Complaint  Patient presents with   Acute Visit    HPI: Patient is a 89 y.o. male seen today for medical management of chronic diseases.    Admitted in the hospital from 12/12 to 12/16 for acute encephalopathy  Patient with a history of hypertension, severe PAD, uncontrolled hypertension.   Was initially admitted on 12/8 for 3 days with possible UTI.  Was discharged back to facility  But was sent back due to continued Lethargy and confusion.  Iin the hospital was diagnosed with pneumonia.  Also had an MRI done which showed acute right frontal lobe infarct. He was admitted in Hospice care Patient is back in the facility he is not taking anything p.o. is lethargic not responding.  Was little anxious and looked in discomfort.  Wife in the room.  Also discussed with hospice nurse  Past Medical History:  Diagnosis Date   AAA (abdominal aortic aneurysm)    asymptomatic   AAA (abdominal aortic aneurysm) 2010   peripheral  angiogram-- bilateral SFA DISEASE  and 60 to 70% infrarenaal abd. aortic stenosis with 15 -mm gradient   Adenomatous colon polyp 11/2003   CAD (coronary artery disease)    Gait abnormality 02/13/2017   Gastroparesis    pt denies   GERD (gastroesophageal reflux disease)    w/ LPR   History of kidney stones    x2   Hyperlipidemia    Hypertension    IDA (iron  deficiency anemia)    Peripheral arterial disease    RCEA  by Dr Medford Brunswick   PUD (peptic ulcer disease)    pt unaware   Vertigo     Past Surgical History:  Procedure Laterality Date   ABDOMINAL AORTOGRAM N/A 11/19/2023   Procedure: ABDOMINAL AORTOGRAM;  Surgeon: Sheree Penne Bruckner, MD;  Location:  [facility] INVASIVE CV LAB;  Service: Cardiovascular;  Laterality: N/A;   ABDOMINAL AORTOGRAM W/LOWER EXTREMITY Bilateral 08/05/2018   Procedure: ABDOMINAL AORTOGRAM W/LOWER EXTREMITY;  Surgeon: Court Dorn PARAS, MD;  Location: [facility] INVASIVE CV LAB;  Service: Cardiovascular;  Laterality: Bilateral;   ABDOMINAL AORTOGRAM W/LOWER EXTREMITY N/A 09/26/2021   Procedure: ABDOMINAL AORTOGRAM W/LOWER EXTREMITY;  Surgeon: Court Dorn PARAS, MD;  Location: [facility] INVASIVE CV LAB;  Service: Cardiovascular;  Laterality: N/A;   AORTOGRAM  04/24/2016    Abdominal aortogram, bilateral iliac angiogram, bifemoral runoff   CARDIAC CATHETERIZATION  05/12/2005   RCA   carotid doppler  01/24/2013   RICA endarterectomy,left CCA 0-49%; left bulb and prox ICA 50-69%; bilaateral subclavian < 50%   CAROTID ENDARTERECTOMY  11/01/2010   CATARACT EXTRACTION     COLONOSCOPY     CORONARY ANGIOPLASTY  05/18/2005   2 STENTS distal RCA AND PROXIMAL-MID RCA   5 total stents per pt.   CYSTOSCOPY/URETEROSCOPY/HOLMIUM LASER/STENT PLACEMENT Left 01/11/2018   Procedure: LEFT URETEROSCOPY/HOLMIUM LASER/STENT PLACEMENT;  Surgeon: Cam Morene ORN, MD;  Location: [facility] ORS;  Service: Urology;  Laterality: Left;   DOPPLER ECHOCARDIOGRAPHY  02/07/2012   EF 55%,SHOWED NO ISCHEMIA    HERNIA REPAIR     umbilical   lower arterial  doppler  02/04/2013   aotra 1.5 x 1.5 cm; distal abd aorta 70-99%,proximal common  iliac arteries -very stenotic with increased velocities>50%,may be falsely elevated as a result of residual plaque from the distal aorta stenosis   LOWER EXTREMITY ANGIOGRAPHY N/A 11/19/2023   Procedure: Lower Extremity Angiography;  Surgeon: Sheree Penne Bruckner, MD;  Location: [facility] INVASIVE CV LAB;  Service: Cardiovascular;  Laterality: N/A;   lower extremity doppler  June 18 ,2013   ABI'S ABNORMAL, RABI was 0.88 and LABI 0.75 ,with 3-vessel  run off   NM MYOVIEW  LTD  MAY 23,2011   showed no significant ischemia;   NM MYOVIEW   LTD  04/22/2008   ef 77%,exercise capcity 6 METS ,exaggerated blood pressure response to exercise   PERIPHERAL VASCULAR CATHETERIZATION N/A 04/24/2016   Procedure: Lower Extremity Angiography;  Surgeon: Dorn JINNY Lesches, MD;  Location: [facility] INVASIVE CV LAB;  Service: Cardiovascular;  Laterality: N/A;   PERIPHERAL VASCULAR CATHETERIZATION  04/24/2016   Procedure: Peripheral Vascular Intervention;  Surgeon: Dorn JINNY Lesches, MD;  Location: [facility] INVASIVE CV LAB;  Service: Cardiovascular;;  Aorta   retrograde central aortic catheterization  05/19/2005   cutting balloon atherectomy, c-circ stenosis with DES STENTING CYPHER   TONSILLECTOMY     TOTAL HIP ARTHROPLASTY Right 07/11/2022   Procedure: TOTAL HIP ARTHROPLASTY ANTERIOR APPROACH;  Surgeon: Ernie Cough, MD;  Location: [facility];  Service: Orthopedics;  Laterality: Right;   TOTAL KNEE ARTHROPLASTY Left 01/03/2019   Procedure: TOTAL KNEE ARTHROPLASTY;  Surgeon: Kay Kemps, MD;  Location: [facility];  Service: Orthopedics;  Laterality: Left;  with IS block   TRANSURETHRAL RESECTION OF PROSTATE N/A 09/08/2016   Procedure: TRANSURETHRAL RESECTION OF THE PROSTATE (TURP);  Surgeon: Morene LELON Salines, MD;  Location: [facility];  Service: Urology;  Laterality: N/A;   TRANSURETHRAL RESECTION OF PROSTATE     10-10-17  Dr. Salines   TRANSURETHRAL RESECTION OF PROSTATE N/A 10/10/2017   Procedure: TRANSURETHRAL RESECTION OF THE PROSTATE (TURP);  Surgeon: Salines Morene LELON, MD;  Location: [facility];  Service: Urology;  Laterality: N/A;   VASECTOMY      Allergies[1]  Outpatient Encounter Medications as of 07/21/2024  Medication Sig   haloperidol  (HALDOL ) 2 MG/ML solution Place 0.3 mLs (0.6 mg total) under the tongue every 4 (four) hours as needed for agitation (or delirium).   LORazepam  (ATIVAN ) 2 MG/ML concentrated solution Place 0.5 mLs (1 mg total) under the tongue every 4 (four) hours as needed for anxiety.   Morphine  Sulfate (MORPHINE  CONCENTRATE) 10 mg /  0.5 ml concentrated solution Place 0.25 mLs (5 mg total) under the tongue every 2 (two) hours as needed for moderate pain (pain score 4-6) (or dyspnea).   No facility-administered encounter medications on file as of 07/21/2024.    Review of Systems:  Review of Systems  Unable to perform ROS: Other    Health Maintenance  Topic Date Due   Influenza Vaccine  02/29/2024   COVID-19 Vaccine (7 - 2025-26 season) 03/31/2024   HEMOGLOBIN A1C  08/13/2024   OPHTHALMOLOGY EXAM  08/26/2024   Pneumococcal Vaccine: 50+ Years (3 of 3 - PCV) 01/11/2025   FOOT EXAM  02/27/2025   DTaP/Tdap/Td (3 - Tdap) 01/24/2034   Zoster Vaccines- Shingrix  Completed   Meningococcal B Vaccine  Aged Out    Physical Exam: There were no vitals filed for this visit. There is no height or weight on file to calculate BMI. Physical Exam Vitals reviewed.  Constitutional:      Comments: Lethargic  HENT:     Head: Normocephalic.     Nose: Nose normal.  Mouth/Throat:     Mouth: Mucous membranes are moist.     Pharynx: Oropharynx is clear.  Eyes:     Pupils: Pupils are equal, round, and reactive to light.  Cardiovascular:     Rate and Rhythm: Regular rhythm. Tachycardia present.     Pulses: Normal pulses.     Heart sounds: No murmur heard. Pulmonary:     Effort: Pulmonary effort is normal. No respiratory distress.     Breath sounds: Normal breath sounds. No rales.  Abdominal:     General: Abdomen is flat. Bowel sounds are normal.     Palpations: Abdomen is soft.  Musculoskeletal:        General: No swelling.     Cervical back: Neck supple.  Skin:    General: Skin is warm.  Neurological:     Comments: Does not respond Just opens his Eyes  Psychiatric:        Mood and Affect: Mood normal.        Thought Content: Thought content normal.     Labs reviewed: Basic Metabolic Panel: Recent Labs    07/12/24 0206 07/12/24 1943 07/13/24 0610 07/13/24 0803 07/14/24 0304  NA 151* 152* 149*  --   147*  K 3.6 3.3* 3.4*  --  3.4*  CL 120* 117* 115*  --  116*  CO2 19* 21* 20*  --  21*  GLUCOSE 164* 180* 178*  --  156*  BUN 23 19 15   --  12  CREATININE 1.34* 1.20 1.25*  --  1.32*  CALCIUM  9.1 8.8* 8.7*  --  8.1*  MG 2.1  --   --  2.3 2.3  PHOS  --   --   --   --  2.0*   Liver Function Tests: Recent Labs    07/09/24 0333 07/10/24 0432 07/11/24 0250  AST 47* 35 32  ALT 57* 52* 48*  ALKPHOS 121 117 122  BILITOT 0.5 0.4 0.4  PROT 6.2* 6.7 7.2  ALBUMIN 2.2* 2.3* 2.6*   No results for input(s): LIPASE, AMYLASE in the last 8760 hours. Recent Labs    07/07/24 2006 07/11/24 0251  AMMONIA 23 16   CBC: Recent Labs    09/25/23 2212 09/27/23 0228 07/07/24 1836 07/08/24 0347 07/11/24 0250 07/11/24 0510 07/12/24 0206 07/13/24 0610 07/14/24 0304  WBC 7.4   < > 7.7   < > 11.6*  --  11.6* 17.6* 15.7*  NEUTROABS 5.6  --  5.4  --  9.7*  --   --   --   --   HGB 11.7*   < > 11.1*   < > 11.5*   < > 11.2* 11.6* 10.5*  HCT 36.7*   < > 34.2*   < > 35.7*   < > 35.2* 35.9* 32.4*  MCV 91.5   < > 90.5   < > 91.8  --  93.1 90.7 91.8  PLT 181   < > 237   < > 269  --  252 269 238   < > = values in this interval not displayed.   Lipid Panel: Recent Labs    02/11/24 0000  CHOL 134  HDL 38  LDLCALC 59  TRIG 185*   Lab Results  Component Value Date   HGBA1C 7.9 02/11/2024    Procedures since last visit: [name] Result Date: 07/14/2024 EXAM: MRI BRAIN WITHOUT CONTRAST 07/14/2024 12:36:51 PM TECHNIQUE: Multiplanar multisequence MRI of the head/brain was performed without the administration of  intravenous contrast. COMPARISON: Head CT 07/11/2024, previous MRI 02/07/2021. CLINICAL HISTORY: 89 year old male. Acute neuro deficit, stroke suspected. FINDINGS: BRAIN AND VENTRICLES: Brain volume not significantly changed since 2022. Anterior right middle frontal gyrus subcortical white matter diffusion restriction in an area of 12 mm (series 5 image 78) with associated mild  T2 and FLAIR hyperintensity. No hemorrhage or mass effect. No other diffusion restriction. Underlying chronic widespread T2 and FLAIR hyperintensity throughout the cerebral white matter, which has progressed some since 2022. No convincing cortical encephalomalacia or chronic cerebral blood products. Comparatively mild T2 heterogeneity in the deep gray matter nuclei. Brainstem and cerebellum appear negative for age. No midline shift. No hydrocephalus. The sella is unremarkable. Normal flow voids. ORBITS: No acute abnormality. SINUSES AND MASTOIDS: No acute abnormality. BONES AND SOFT TISSUES: Normal visible cervical spine. Normal marrow signal. No acute soft tissue abnormality. IMPRESSION: 1. Acute anterior right frontal lobe subcortical white matter infarct (12 mm). No hemorrhage or mass effect. 2. Progression of chronic cerebral white matter disease since 02/07/2021. Electronically signed by: Helayne Hurst MD 07/14/2024 12:54 PM EST RP Workstation: [id number]   CT HEAD WO CONTRAST ( ) Result Date: 07/11/2024 EXAM: CT HEAD WITHOUT 07/11/2024 01:14:00 PM TECHNIQUE: CT of the head was performed without the administration of intravenous contrast. Automated exposure control, iterative reconstruction, and/or weight based adjustment of the mA/kV was utilized to reduce the radiation dose to as low as reasonably achievable. COMPARISON: 07/07/2024 CLINICAL HISTORY: Altered mental status, nontraumatic (Ped 0-17y) FINDINGS: BRAIN AND VENTRICLES: No acute intracranial hemorrhage. No mass effect or midline shift. No extra-axial fluid collection. No evidence of acute infarct. No hydrocephalus. Stable age-related atrophy and moderate chronic ischemic small vessel disease. Moderate calcific atheromatous disease. ORBITS: No acute abnormality. Status post bilateral lens replacement. SINUSES AND MASTOIDS: No acute abnormality. SOFT TISSUES AND SKULL: No acute skull fracture. No acute soft tissue abnormality. IMPRESSION: 1. No acute  intracranial abnormality. Electronically signed by: Donnice Mania MD 07/11/2024 01:32 PM EST RP Workstation: [id number]   DG Chest Port 1 View Result Date: 07/11/2024 EXAM: 1 VIEW(S) XRAY OF THE CHEST 07/11/2024 03:16:00 AM COMPARISON: CXR 07/07/2024, CT Chest 03/01/2023, CT A/P 07/07/2024. CLINICAL HISTORY: Altered mental state. FINDINGS: LUNGS AND PLEURA: New small left pleural effusion versus left base airspace opacity. Increased interstitial markings. No pneumothorax. HEART AND MEDIASTINUM: Unchanged cardiomediastinal silhouette. Atherosclerotic calcifications. BONES AND SOFT TISSUES: No acute osseous abnormality. IMPRESSION: 1. New small left pleural effusion versus left basilar airspace opacity. 2. Increased interstitial markings. Electronically signed by: Morgane Naveau MD 07/11/2024 03:30 AM EST RP Workstation: [id number]   US  RENAL Result Date: 07/08/2024 CLINICAL DATA:  Acute kidney injury. EXAM: RENAL / URINARY TRACT ULTRASOUND COMPLETE COMPARISON:  Abdomen and pelvis CT dated 07/07/2024. FINDINGS: Right Kidney: Renal measurements: 11.8 x 6.8 x 6.1 cm = volume: 256 mL. Echogenicity within normal limits. No mass or hydronephrosis visualized. Left Kidney: Renal measurements: 9.6 x 5.2 x 5.1 cm = volume: 132 mL. Diffuse cortical thinning. Echogenicity within normal limits. No mass or hydronephrosis visualized. Bladder: Appears normal for degree of bladder distention. Other: The examination was limited by patient body habitus and lack of cooperation. IMPRESSION: 1. No acute abnormality. 2. Left renal atrophy. Electronically Signed   By: Elspeth Bathe M.D.   On: 07/08/2024 15:18   CT ABDOMEN PELVIS WO CONTRAST Result Date: 07/07/2024 EXAM: CT ABDOMEN AND PELVIS WITHOUT CONTRAST 07/07/2024 08:18:20 PM TECHNIQUE: CT of the abdomen and pelvis was performed without the administration of intravenous contrast. Multiplanar reformatted images are  provided for review. Automated exposure control, iterative  reconstruction, and/or weight-based adjustment of the mA/kV was utilized to reduce the radiation dose to as low as reasonably achievable. COMPARISON: None available. CLINICAL HISTORY: Abdominal pain, acute, nonlocalized; AKI, poor historian, eval for obstruction. FINDINGS: LOWER CHEST: Calcified bibasilar granulomas with calcified lymph node noted on the right interlobular region. Aortic valve leaflet calcification, mitral annular calcification, coronary artery calcification. LIVER: A fluid density lesion within the liver likely represents a simple hepatic cyst versus hemangioma. Subcentimeter hypodensities within the left hepatic lobe are too small to characterize (3.16). The liver is otherwise unremarkable. GALLBLADDER AND BILE DUCTS: Gallbladder is unremarkable. No biliary ductal dilatation. SPLEEN: Punctate calcifications throughout the spleen are consistent with the sequelae of prior granulomatous disease, as seen within the lungs. PANCREAS: Diffusely atrophic pancreas. No focal lesion. Otherwise normal pancreatic contour. No surrounding inflammatory changes. No main pancreatic ductal dilatation. ADRENAL GLANDS: No acute abnormality. KIDNEYS, URETERS AND BLADDER: Atrophic left kidney with renal cortical scarring. Fluid density lesion within the left kidney likely represents a simple renal cyst. Simple renal cysts do not require additional follow-up unless clinically indicated due to signs/symptoms. No stones in the kidneys or ureters. No hydronephrosis. No perinephric or periureteral stranding. Urinary bladder is unremarkable. GI AND BOWEL: Stomach demonstrates no acute abnormality. No small or large bowel thickening or dilatation. The appendix is unremarkable. Colonic diverticulosis. PERITONEUM AND RETROPERITONEUM: No ascites. No free air. VASCULATURE: Severe atherosclerotic plaque of the aorta and its main branches. LYMPH NODES: No lymphadenopathy. REPRODUCTIVE ORGANS: No acute abnormality. BONES AND SOFT  TISSUES: Total right hip arthroplasty partially visualized. Multilevel mild-to-moderate degenerative change of the spine. Chronic L1 compression fracture. Density sclerotic lesion of the T12 level, likely a bone island. No focal soft tissue abnormality. IMPRESSION: 1. No acute findings in the abdomen or pelvis with limited evaluation on this noncontrast study. 2. Severe atherosclerotic plaque of the aorta and its main branches. 3. Sequelae of prior granulomatous disease. 4. Other, non-acute and/or normal findings as above. Electronically signed by: Morgane Naveau MD 07/07/2024 08:29 PM EST RP Workstation: [id number]   CT Head Wo Contrast Result Date: 07/07/2024 EXAM: CT HEAD WITHOUT CONTRAST 07/07/2024 07:08:00 PM TECHNIQUE: CT of the head was performed without the administration of intravenous contrast. Automated exposure control, iterative reconstruction, and/or weight based adjustment of the mA/kV was utilized to reduce the radiation dose to as low as reasonably achievable. COMPARISON: CT head 03/01/2023 CLINICAL HISTORY: Mental status change, unknown cause FINDINGS: BRAIN AND VENTRICLES: No acute hemorrhage. No evidence of acute infarct. No hydrocephalus. No extra-axial collection. No mass effect or midline shift. Patchy white matter hypoattenuations, compatible with chronic and vascular ischemic change. ORBITS: No acute abnormality. SINUSES: No acute abnormality. SOFT TISSUES AND SKULL: No acute soft tissue abnormality. No skull fracture. IMPRESSION: 1. No acute intracranial abnormality. Electronically signed by: Gilmore Molt MD 07/07/2024 07:16 PM EST RP Workstation: [id number]   DG Chest Portable 1 View Result Date: 07/07/2024 CLINICAL DATA:  Altered level of consciousness EXAM: PORTABLE CHEST 1 VIEW COMPARISON:  09/28/2023 FINDINGS: Single frontal view of the chest demonstrates an unremarkable cardiac silhouette. No acute airspace disease, effusion, or pneumothorax. No acute bony abnormalities.  IMPRESSION: 1. No acute intrathoracic process. Electronically Signed   By: Ozell Daring M.D.   On: 07/07/2024 19:05    Assessment/Plan Patient with severe PAD and Uncontrolled Hypertension with Acute Stroke Admitted in Hospice Goals are comfort D/W the Hospice nurse to remove his PICC line Continue PO morphine  and Ativan .  Labs/tests ordered:  * No order type specified * Next appt:  Visit date not found          [1] Allergies Allergen Reactions   Lisinopril Cough   Niaspan [Niacin Er (Antihyperlipidemic)] Rash  "

## 2024-07-29 ENCOUNTER — Telehealth: Payer: Self-pay | Admitting: Internal Medicine

## 2024-07-29 NOTE — Telephone Encounter (Signed)
 Please advise....  Copied from CRM [id number]. Topic: General - Deceased Patient >> Aug 25, 2024 10:12 AM Debby BROCKS wrote: Name of caller: Bruna  Date of death: 08-22-24   Name of funeral home: [employer] [facility]  Phone number of funeral home: [phone]  Provider that needs to sign form: Dr. Charlanne  Timeline for signing: As soon as possible as they have already sent the information to [facility]

## 2024-07-31 DEATH — deceased

## 2024-08-29 ENCOUNTER — Ambulatory Visit (HOSPITAL_COMMUNITY)
